# Patient Record
Sex: Female | Born: 1950 | Race: White | Hispanic: No | Marital: Single | Smoking: Never smoker
Health system: Southern US, Community
[De-identification: ages and names within clinical notes are randomized; demographics above are authoritative.]

## PROBLEM LIST (undated history)

## (undated) DIAGNOSIS — I499 Cardiac arrhythmia, unspecified: Secondary | ICD-10-CM

## (undated) DIAGNOSIS — R011 Cardiac murmur, unspecified: Secondary | ICD-10-CM

## (undated) DIAGNOSIS — G4733 Obstructive sleep apnea (adult) (pediatric): Secondary | ICD-10-CM

## (undated) DIAGNOSIS — T8859XA Other complications of anesthesia, initial encounter: Secondary | ICD-10-CM

## (undated) DIAGNOSIS — T884XXA Failed or difficult intubation, initial encounter: Secondary | ICD-10-CM

## (undated) DIAGNOSIS — I10 Essential (primary) hypertension: Secondary | ICD-10-CM

## (undated) DIAGNOSIS — I779 Disorder of arteries and arterioles, unspecified: Secondary | ICD-10-CM

## (undated) DIAGNOSIS — I5020 Unspecified systolic (congestive) heart failure: Secondary | ICD-10-CM

## (undated) DIAGNOSIS — I739 Peripheral vascular disease, unspecified: Principal | ICD-10-CM

## (undated) DIAGNOSIS — D649 Anemia, unspecified: Secondary | ICD-10-CM

## (undated) DIAGNOSIS — E119 Type 2 diabetes mellitus without complications: Secondary | ICD-10-CM

## (undated) DIAGNOSIS — I2510 Atherosclerotic heart disease of native coronary artery without angina pectoris: Secondary | ICD-10-CM

## (undated) DIAGNOSIS — I350 Nonrheumatic aortic (valve) stenosis: Secondary | ICD-10-CM

## (undated) DIAGNOSIS — A159 Respiratory tuberculosis unspecified: Secondary | ICD-10-CM

## (undated) DIAGNOSIS — E669 Obesity, unspecified: Secondary | ICD-10-CM

## (undated) DIAGNOSIS — I4891 Unspecified atrial fibrillation: Secondary | ICD-10-CM

## (undated) DIAGNOSIS — E785 Hyperlipidemia, unspecified: Secondary | ICD-10-CM

## (undated) DIAGNOSIS — I480 Paroxysmal atrial fibrillation: Secondary | ICD-10-CM

## (undated) DIAGNOSIS — M1990 Unspecified osteoarthritis, unspecified site: Secondary | ICD-10-CM

## (undated) DIAGNOSIS — Z9289 Personal history of other medical treatment: Secondary | ICD-10-CM

## (undated) DIAGNOSIS — E039 Hypothyroidism, unspecified: Secondary | ICD-10-CM

## (undated) HISTORY — DX: Personal history of other medical treatment: Z92.89

## (undated) HISTORY — DX: Atherosclerotic heart disease of native coronary artery without angina pectoris: I25.10

## (undated) HISTORY — PX: EYE SURGERY: SHX253

## (undated) HISTORY — PX: OTHER SURGICAL HISTORY: SHX169

## (undated) HISTORY — DX: Unspecified systolic (congestive) heart failure: I50.20

## (undated) HISTORY — PX: TONSILLECTOMY: SUR1361

## (undated) HISTORY — PX: COLONOSCOPY W/ POLYPECTOMY: SHX1380

## (undated) HISTORY — DX: Unspecified atrial fibrillation: I48.91

## (undated) HISTORY — DX: Obesity, unspecified: E66.9

## (undated) HISTORY — DX: Peripheral vascular disease, unspecified: I73.9

## (undated) HISTORY — PX: UPPER GI ENDOSCOPY: SHX6162

## (undated) HISTORY — DX: Obstructive sleep apnea (adult) (pediatric): G47.33

## (undated) HISTORY — DX: Disorder of arteries and arterioles, unspecified: I77.9

## (undated) HISTORY — PX: CATARACT EXTRACTION: SUR2

## (undated) HISTORY — PX: DILATION AND CURETTAGE OF UTERUS: SHX78

## (undated) HISTORY — DX: Nonrheumatic aortic (valve) stenosis: I35.0

## (undated) HISTORY — DX: Hyperlipidemia, unspecified: E78.5

---

## 2007-01-17 DIAGNOSIS — L02519 Cutaneous abscess of unspecified hand: Secondary | ICD-10-CM | POA: Insufficient documentation

## 2007-01-17 DIAGNOSIS — L03019 Cellulitis of unspecified finger: Secondary | ICD-10-CM | POA: Insufficient documentation

## 2007-02-14 DIAGNOSIS — R7401 Elevation of levels of liver transaminase levels: Secondary | ICD-10-CM | POA: Insufficient documentation

## 2007-02-14 DIAGNOSIS — R7402 Elevation of levels of lactic acid dehydrogenase (LDH): Secondary | ICD-10-CM | POA: Insufficient documentation

## 2007-02-14 DIAGNOSIS — L4050 Arthropathic psoriasis, unspecified: Secondary | ICD-10-CM | POA: Insufficient documentation

## 2010-06-06 DIAGNOSIS — E039 Hypothyroidism, unspecified: Secondary | ICD-10-CM | POA: Insufficient documentation

## 2013-10-27 ENCOUNTER — Inpatient Hospital Stay (HOSPITAL_COMMUNITY)
Admission: EM | Admit: 2013-10-27 | Discharge: 2013-10-28 | DRG: 310 | Disposition: A | Discharge status: Home / Self Care | Payer: BC Managed Care – PPO | Attending: Cardiology | Admitting: Cardiology

## 2013-10-27 ENCOUNTER — Encounter (HOSPITAL_COMMUNITY): Payer: Self-pay | Admitting: Emergency Medicine

## 2013-10-27 ENCOUNTER — Emergency Department (HOSPITAL_COMMUNITY): Payer: BC Managed Care – PPO

## 2013-10-27 DIAGNOSIS — Z7985 Long-term (current) use of injectable non-insulin antidiabetic drugs: Secondary | ICD-10-CM | POA: Diagnosis present

## 2013-10-27 DIAGNOSIS — E669 Obesity, unspecified: Secondary | ICD-10-CM | POA: Diagnosis present

## 2013-10-27 DIAGNOSIS — E034 Atrophy of thyroid (acquired): Secondary | ICD-10-CM

## 2013-10-27 DIAGNOSIS — E119 Type 2 diabetes mellitus without complications: Secondary | ICD-10-CM

## 2013-10-27 DIAGNOSIS — E1159 Type 2 diabetes mellitus with other circulatory complications: Secondary | ICD-10-CM

## 2013-10-27 DIAGNOSIS — I152 Hypertension secondary to endocrine disorders: Secondary | ICD-10-CM

## 2013-10-27 DIAGNOSIS — I4891 Unspecified atrial fibrillation: Principal | ICD-10-CM

## 2013-10-27 DIAGNOSIS — I517 Cardiomegaly: Secondary | ICD-10-CM

## 2013-10-27 DIAGNOSIS — I10 Essential (primary) hypertension: Secondary | ICD-10-CM | POA: Diagnosis present

## 2013-10-27 DIAGNOSIS — E039 Hypothyroidism, unspecified: Secondary | ICD-10-CM | POA: Diagnosis present

## 2013-10-27 DIAGNOSIS — E079 Disorder of thyroid, unspecified: Secondary | ICD-10-CM

## 2013-10-27 HISTORY — DX: Unspecified atrial fibrillation: I48.91

## 2013-10-27 HISTORY — DX: Essential (primary) hypertension: I10

## 2013-10-27 HISTORY — DX: Hypothyroidism, unspecified: E03.9

## 2013-10-27 HISTORY — DX: Unspecified osteoarthritis, unspecified site: M19.90

## 2013-10-27 HISTORY — DX: Paroxysmal atrial fibrillation: I48.0

## 2013-10-27 HISTORY — DX: Type 2 diabetes mellitus without complications: E11.9

## 2013-10-27 LAB — CBC WITH DIFFERENTIAL/PLATELET
BASOS PCT: 1 % (ref 0–1)
Basophils Absolute: 0 10*3/uL (ref 0.0–0.1)
EOS ABS: 0.1 10*3/uL (ref 0.0–0.7)
Eosinophils Relative: 2 % (ref 0–5)
HCT: 35.6 % — ABNORMAL LOW (ref 36.0–46.0)
HEMOGLOBIN: 11.5 g/dL — AB (ref 12.0–15.0)
LYMPHS ABS: 0.9 10*3/uL (ref 0.7–4.0)
Lymphocytes Relative: 11 % — ABNORMAL LOW (ref 12–46)
MCH: 27.8 pg (ref 26.0–34.0)
MCHC: 32.3 g/dL (ref 30.0–36.0)
MCV: 86 fL (ref 78.0–100.0)
MONO ABS: 0.5 10*3/uL (ref 0.1–1.0)
MONOS PCT: 6 % (ref 3–12)
NEUTROS ABS: 6.4 10*3/uL (ref 1.7–7.7)
Neutrophils Relative %: 80 % — ABNORMAL HIGH (ref 43–77)
Platelets: 204 10*3/uL (ref 150–400)
RBC: 4.14 MIL/uL (ref 3.87–5.11)
RDW: 15.9 % — ABNORMAL HIGH (ref 11.5–15.5)
WBC: 7.9 10*3/uL (ref 4.0–10.5)

## 2013-10-27 LAB — GLUCOSE, CAPILLARY
GLUCOSE-CAPILLARY: 174 mg/dL — AB (ref 70–99)
GLUCOSE-CAPILLARY: 150 mg/dL — AB (ref 70–99)

## 2013-10-27 LAB — I-STAT TROPONIN, ED: Troponin i, poc: 0.01 ng/mL (ref 0.00–0.08)

## 2013-10-27 LAB — BASIC METABOLIC PANEL
BUN: 14 mg/dL (ref 6–23)
CHLORIDE: 98 meq/L (ref 96–112)
CO2: 24 mEq/L (ref 19–32)
Calcium: 9.3 mg/dL (ref 8.4–10.5)
Creatinine, Ser: 0.46 mg/dL — ABNORMAL LOW (ref 0.50–1.10)
GFR calc non Af Amer: >90 mL/min (ref >90)
GLUCOSE: 230 mg/dL — AB (ref 70–99)
Potassium: 3.9 mEq/L (ref 3.7–5.3)
Sodium: 137 mEq/L (ref 137–147)

## 2013-10-27 LAB — TROPONIN I: Troponin I: <0.3 ng/mL (ref <0.30)

## 2013-10-27 LAB — HEMOGLOBIN A1C
Hgb A1c MFr Bld: 7.7 % — ABNORMAL HIGH (ref <5.7)
Mean Plasma Glucose: 174 mg/dL — ABNORMAL HIGH (ref <117)

## 2013-10-27 LAB — PRO B NATRIURETIC PEPTIDE: Pro B Natriuretic peptide (BNP): 200.6 pg/mL — ABNORMAL HIGH (ref 0–125)

## 2013-10-27 LAB — TSH: TSH: 3.07 u[IU]/mL (ref 0.350–4.500)

## 2013-10-27 MED ORDER — SODIUM CHLORIDE 0.9 % IJ SOLN: 3.0000 mL | INTRAMUSCULAR | Status: DC | PRN

## 2013-10-27 MED ORDER — DILTIAZEM HCL 60 MG PO TABS
60.0000 mg | ORAL_TABLET | ORAL | Status: AC
  Administered 2013-10-27: 60 mg via ORAL
  Filled 2013-10-27: qty 1

## 2013-10-27 MED ORDER — SODIUM CHLORIDE 0.9 % IV SOLN: 250.0000 mL | INTRAVENOUS | Status: DC | PRN

## 2013-10-27 MED ORDER — NITROGLYCERIN 0.4 MG SL SUBL: 0.4000 mg | SUBLINGUAL_TABLET | SUBLINGUAL | Status: DC | PRN

## 2013-10-27 MED ORDER — GLYBURIDE MICRONIZED 3 MG PO TABS: 3.0000 mg | ORAL_TABLET | Freq: Every day | ORAL | Status: DC

## 2013-10-27 MED ORDER — ZOLPIDEM TARTRATE 5 MG PO TABS: 5.0000 mg | ORAL_TABLET | Freq: Every evening | ORAL | Status: DC | PRN

## 2013-10-27 MED ORDER — ALPRAZOLAM 0.25 MG PO TABS: 0.2500 mg | ORAL_TABLET | Freq: Two times a day (BID) | ORAL | Status: DC | PRN

## 2013-10-27 MED ORDER — GLYBURIDE MICRONIZED 3 MG PO TABS
3.0000 mg | ORAL_TABLET | Freq: Every day | ORAL | Status: DC
  Administered 2013-10-27 – 2013-10-28 (×2): 3 mg via ORAL
  Filled 2013-10-27 (×3): qty 1

## 2013-10-27 MED ORDER — DILTIAZEM HCL 60 MG PO TABS
60.0000 mg | ORAL_TABLET | Freq: Four times a day (QID) | ORAL | Status: DC
  Administered 2013-10-27 – 2013-10-28 (×5): 60 mg via ORAL
  Filled 2013-10-27 (×7): qty 1

## 2013-10-27 MED ORDER — SODIUM CHLORIDE 0.9 % IJ SOLN
3.0000 mL | Freq: Two times a day (BID) | INTRAMUSCULAR | Status: DC
  Administered 2013-10-27 – 2013-10-28 (×2): 3 mL via INTRAVENOUS

## 2013-10-27 MED ORDER — ONDANSETRON HCL 4 MG/2ML IJ SOLN: 4.0000 mg | Freq: Four times a day (QID) | INTRAMUSCULAR | Status: DC | PRN

## 2013-10-27 MED ORDER — INSULIN ASPART 100 UNIT/ML ~~LOC~~ SOLN: 0.0000 [IU] | Freq: Every day | SUBCUTANEOUS | Status: DC

## 2013-10-27 MED ORDER — APIXABAN 5 MG PO TABS
5.0000 mg | ORAL_TABLET | Freq: Two times a day (BID) | ORAL | Status: DC
  Administered 2013-10-27 – 2013-10-28 (×3): 5 mg via ORAL
  Filled 2013-10-27 (×4): qty 1

## 2013-10-27 MED ORDER — ASPIRIN 81 MG PO CHEW
324.0000 mg | CHEWABLE_TABLET | Freq: Once | ORAL | Status: DC
  Filled 2013-10-27: qty 4

## 2013-10-27 MED ORDER — METOPROLOL SUCCINATE ER 100 MG PO TB24: 100.0000 mg | ORAL_TABLET | Freq: Every day | ORAL | Status: DC

## 2013-10-27 MED ORDER — INSULIN ASPART 100 UNIT/ML ~~LOC~~ SOLN
0.0000 [IU] | Freq: Three times a day (TID) | SUBCUTANEOUS | Status: DC
  Administered 2013-10-27 – 2013-10-28 (×3): 3 [IU] via SUBCUTANEOUS
  Administered 2013-10-28: 12:00:00 via SUBCUTANEOUS

## 2013-10-27 MED ORDER — METOPROLOL SUCCINATE ER 100 MG PO TB24
100.0000 mg | ORAL_TABLET | Freq: Every day | ORAL | Status: DC
  Administered 2013-10-27 – 2013-10-28 (×2): 100 mg via ORAL
  Filled 2013-10-27 (×2): qty 1

## 2013-10-27 MED ORDER — SIMVASTATIN 10 MG PO TABS
10.0000 mg | ORAL_TABLET | Freq: Every day | ORAL | Status: DC
  Administered 2013-10-27 – 2013-10-28 (×2): 10 mg via ORAL
  Filled 2013-10-27 (×2): qty 1

## 2013-10-27 MED ORDER — LEVOTHYROXINE SODIUM 112 MCG PO TABS
168.0000 ug | ORAL_TABLET | Freq: Every day | ORAL | Status: DC
  Administered 2013-10-27 – 2013-10-28 (×2): 168 ug via ORAL
  Filled 2013-10-27 (×4): qty 1.5

## 2013-10-27 MED ORDER — DILTIAZEM HCL 100 MG IV SOLR: 5.0000 mg/h | Freq: Once | INTRAVENOUS | Status: DC

## 2013-10-27 MED ORDER — DILTIAZEM HCL 25 MG/5ML IV SOLN
10.0000 mg | Freq: Once | INTRAVENOUS | Status: AC
Start: 2013-10-27 — End: 2013-10-27
  Administered 2013-10-27: 10 mg via INTRAVENOUS
  Filled 2013-10-27: qty 5

## 2013-10-27 MED ORDER — ACETAMINOPHEN 325 MG PO TABS: 650.0000 mg | ORAL_TABLET | ORAL | Status: DC | PRN

## 2013-10-27 NOTE — ED Notes (Signed)
PA at bedside.

## 2013-10-27 NOTE — ED Notes (Signed)
Communication with Pharmacy. Will send missing meds

## 2013-10-27 NOTE — H&P (Signed)
Addendum to H&P  OUT PATIENT MEDICATIONS: glynase 3 mg tab daily Hydrodiuril 25 mg daily Levothyroxine 168 mcg po daily Cozaar 50 mg daily mevacor 20 mg daily Metformin 500 mg BID Toprol XL 100 mg daily  Family History: Mother died in childbirth Father died with COPD, cardiac disease and CHF Sister died at 106 with cerebral bleed 1 sister died with a fib, PPM and heard disease 1 sister died with cancer 3 living sisters, with atrial fib.   Dillard Cannon, MD

## 2013-10-27 NOTE — ED Notes (Addendum)
Communication with Cards. Pt needs Step down bed canceled. Per Dr. Ron Parker see new order. Pt to go to Telemetry Bed. Vitals stable.

## 2013-10-27 NOTE — Progress Notes (Signed)
  Echocardiogram 2D Echocardiogram has been performed.  Carney Corners 10/27/2013, 2:47 PM

## 2013-10-27 NOTE — ED Provider Notes (Signed)
Medical screening examination/treatment/procedure(s) were performed by non-physician practitioner and as supervising physician I was immediately available for consultation/collaboration.   EKG Interpretation None       Kalman Drape, MD 10/27/13 [phone]

## 2013-10-27 NOTE — Progress Notes (Signed)
Patient ID: Emily [name], female   DOB: [birth year], 63 y.o.   MRN: [id number]  The patient remains in atrial fibrillation. She has overall good left ventricular function. However she has risk factors for coronary disease. I considered giving her 300 mg of flecainide, but I have not proven that she does not have ischemic disease. Therefore we will continue to watch her rhythm during the night. I have ordered a started her anticoagulation. Decision can be made tomorrow as to whether or not she can go home with anticoagulation for cardioversion later , or whether we should proceed with TEE cardioversion in the hospital.   Daryel November, MD

## 2013-10-27 NOTE — ED Notes (Signed)
Pt reports around 0400 this morning she turned over in bed and had sudden back pain, sob, and diaphoresis. Pt was showing a-fib on monitor per EMS. Pt has no hx of a-fib. Pt denies any cp.

## 2013-10-27 NOTE — ED Notes (Signed)
MD at bedside. Dr. Ron Parker Cardiology

## 2013-10-27 NOTE — H&P (Signed)
CARDIOLOGY HISTORY AND PHYSICAL   Patient ID: Emily [name] MRN: [id number]  DOB/AGE: Aug 03, 1950 63 y.o. Admit date: 10/27/2013  Primary Care Physician: [name], MD, Rondall Allegra Primary Cardiologist:   New   ?To be [facility] in [place]?  Clinical Summary Emily [name] is a 63 y.o.female. She receives her primary care in [place]. However she lives in [place]. She has never been seen by cardiology before. There is a history of diabetes. There is a history of hypothyroidism that is being treated. There is also a history of hypertension is being treated.  The patient has had stress tests in the past. She has had this because of some back discomfort. She tells me that the studies have been negative in the past. She's not sure she's had an echo. There is no prior documented coronary disease.  The patient awoke this morning with a rapid heart rate and slight discomfort in her back. She was brought to the emergency room. She has rapid atrial fib here. Her EKG reveals no diagnostic change. Her first troponin is normal. BNP is mildly elevated. With Cardizem her rate has been brought down from 160-120.   No Known Allergies  Home Medications  (Not in a hospital admission)  Scheduled Medications . apixaban  5 mg Oral BID  . glyBURIDE micronized  3 mg Oral Q breakfast  . metoprolol succinate  100 mg Oral Daily     Infusions . diltiazem (CARDIZEM) infusion 10 mg/hr (10/27/13 0701)     PRN Medications    Past Medical History  Diagnosis Date  .  diabetes    .  hypertension    .  hypothyroidism    .        Past Surgical History  Procedure Laterality Date  . Tonsillectomy    . Dilation and curettage of uterus      x 5   Family history The patient's family history is positive for coronary artery disease. There is also history of atrial fibrillation in the family.  Social History Emily [name] reports that she has never smoked. She does not have any  smokeless tobacco history on file. Emily [name] reports that she does not drink alcohol.  Review of Systems   Patient denies fever, chills, headache, sweats, rash, change in vision, change in hearing, chest pain, cough, nausea or vomiting, urinary symptoms. All other systems are reviewed and are negative.  Physical Examination Temp:  [98.2 F (36.8 C)] 98.2 F (36.8 C) (05/29 0648) Pulse Rate:  [32-159] 98 (05/29 0915) Resp:  [16-31] 21 (05/29 0915) BP: (105-148)/(47-104) 135/89 mmHg (05/29 0915) SpO2:  [97 %-100 %] 99 % (05/29 0915) No intake or output data in the 24 hours ending 10/27/13 0954  Patient is comfortable lying in the bed in the emergency room. She has family members with her. She is overweight. She is oriented to person time and place. Affect is normal. Head is atraumatic. Conjunctiva and sclera are normal. There is no jugulovenous distention. Lungs are clear. Respiratory effort is nonlabored. Cardiac exam her vitals S1 and S2. The rhythm is irregularly irregular the rate is fast. Abdomen is soft. There is no peripheral edema. There no musculoskeletal deformities. There are no skin rashes.  Lab Results  Basic Metabolic Panel:  Recent Labs Lab 10/27/13 0653  NA 137  K 3.9  CL 98  CO2 24  GLUCOSE 230*  BUN 14  CREATININE 0.46*  CALCIUM 9.3    Liver Function Tests: No results found for this  basename: AST, ALT, ALKPHOS, BILITOT, PROT, ALBUMIN,  in the last 168 hours  CBC:  Recent Labs Lab 10/27/13 0653  WBC 7.9  NEUTROABS 6.4  HGB 11.5*  HCT 35.6*  MCV 86.0  PLT 204    Cardiac Enzymes: No results found for this basename: CKTOTAL, CKMB, CKMBINDEX, TROPONINI,  in the last 168 hours  BNP: No components found with this basename: POCBNP,    Radiology Dg Chest Port 1 View  10/27/2013   CLINICAL DATA:  Atrial fibrillation.  Back pain.  EXAM: PORTABLE CHEST - 1 VIEW  COMPARISON:  None  FINDINGS: Artifact overlies the chest. The heart is at the upper  limits of normal in size. The pulmonary vascularity is normal. There is linear scarring or atelectasis in the left mid lung. No effusions. No bony findings.  IMPRESSION: No evidence heart failure. Linear scarring or atelectasis in the left midlung.   Electronically Signed   By: Nelson Chimes M.D.   On: 10/27/2013 07:02    Prior Cardiac Testing/Procedures:   ECG   EKG reveals no diagnostic changes.  Telemetry   Currently she has atrial fib with a rate of 115.   Impression and Recommendations    Diabetes     Goal give the patient her Glynase this morning. Goalkeeper metformin on hold.    Hypertension     Currently her blood pressure is on the lower side. I am giving her IV Cardizem. Therefore I have not started her other home blood pressure medications other than her metoprolol. All    Hypothyroidism    The patient is on Synthroid. TSH has been sent. Have not yet order for thyroid medication.    Atrial fibrillation with rapid ventricular response     This is a new diagnosis for the patient. Her rate is rapid. She is receiving IV diltiazem. We'll proceed with 2-D echo. If she has good LV function I may give her 300 mg of oral flecainide to see if this helps convert her. There is no documented coronary disease in the past. She has had stress tests in the past. I have started Eliquis 5 mg twice a day to be started this morning. The patient received some aspirin on the way to the hospital. I have stopped any further aspirin for now.  Signed: Carlena Bjornstad 10/27/2013, 9:54 AM

## 2013-10-27 NOTE — ED Notes (Signed)
Per Dr. Ron Parker increase Cardizem to 10 mg/hr

## 2013-10-27 NOTE — ED Provider Notes (Signed)
CSN: [id number]     Arrival date & time 10/27/13  1856 History   First MD Initiated Contact with Patient 10/27/13 0636     Chief Complaint  Patient presents with  . Atrial Fibrillation     (Consider location/radiation/quality/duration/timing/severity/associated sxs/prior Treatment) The history is provided by the patient and medical records.   This is a 63 year old female with past medical history significant for hypertension, diabetes, thyroid disease, presenting to the ED with new onset AFIB.  Patient states she awoke suddenly at 0400 this morning, rolled over in bed and felt that her heart rhythm was irregular. She states she wass having some pain in her upper back, shortness of breath, and diaphoresis.  EMS was called, and patient was found to be in A. fib. She is placed on 2 L of supplemental oxygen with improvement of her symptoms.  Patient has no prior cardiac history. She states 4/6 of her sisters all have A. Fib.  Pt give ASA by EMS en route.  VS stable on arrival.  History reviewed. No pertinent past medical history. History reviewed. No pertinent past surgical history. No family history on file. History  Substance Use Topics  . Smoking status: Not on file  . Smokeless tobacco: Not on file  . Alcohol Use: Not on file   OB History   Grav Para Term Preterm Abortions TAB SAB Ect Mult Living                 Review of Systems  Constitutional: Positive for diaphoresis.  Respiratory: Positive for shortness of breath.   Cardiovascular: Positive for palpitations.      Allergies  Review of patient's allergies indicates not on file.  Home Medications   Prior to Admission medications   Not on File   BP 126/61  Pulse 159  Temp(Src) 98.2 F (36.8 C) (Oral)  Resp 20  SpO2 99%  Physical Exam  Nursing note and vitals reviewed. Constitutional: She is oriented to person, place, and time. She appears well-developed and well-nourished. No distress.  obese  HENT:  Head:  Normocephalic and atraumatic.  Mouth/Throat: Oropharynx is clear and moist.  Eyes: Conjunctivae and EOM are normal. Pupils are equal, round, and reactive to light.  Neck: Normal range of motion.  Cardiovascular: Normal heart sounds, intact distal pulses and normal pulses.  An irregularly irregular rhythm present. Tachycardia present.   AFIB  Pulmonary/Chest: Effort normal and breath sounds normal.  Abdominal: Soft. Bowel sounds are normal.  Musculoskeletal: Normal range of motion.  Trace edema BLE  Neurological: She is alert and oriented to person, place, and time.  Skin: Skin is warm and dry. She is not diaphoretic.  Psychiatric: She has a normal mood and affect.    ED Course  Procedures (including critical care time)  CRITICAL CARE Performed by: Larene Pickett   Total critical care time: 35  Critical care time was exclusive of separately billable procedures and treating other patients.  Critical care was necessary to treat or prevent imminent or life-threatening deterioration.  Critical care was time spent personally by me on the following activities: development of treatment plan with patient and/or surrogate as well as nursing, discussions with consultants, evaluation of patient's response to treatment, examination of patient, obtaining history from patient or surrogate, ordering and performing treatments and interventions, ordering and review of laboratory studies, ordering and review of radiographic studies, pulse oximetry and re-evaluation of patient's condition.  Medications  aspirin chewable tablet 324 mg (324 mg Oral Not Given  10/27/13 0701)  diltiazem (CARDIZEM) injection 10 mg (10 mg Intravenous New Bag/Given 10/27/13 0701)  diltiazem (CARDIZEM) 100 mg in dextrose 5 % 100 mL infusion (5 mg/hr Intravenous New Bag/Given 10/27/13 0701)     Labs Review Labs Reviewed  CBC WITH DIFFERENTIAL - Abnormal; Notable for the following:    Hemoglobin 11.5 (*)    HCT 35.6 (*)     RDW 15.9 (*)    Neutrophils Relative % 80 (*)    Lymphocytes Relative 11 (*)    All other components within normal limits  BASIC METABOLIC PANEL - Abnormal; Notable for the following:    Glucose, Bld 230 (*)    Creatinine, Ser 0.46 (*)    All other components within normal limits  PRO B NATRIURETIC PEPTIDE - Abnormal; Notable for the following:    Pro B Natriuretic peptide (BNP) 200.6 (*)    All other components within normal limits  Randolm Idol, ED    Imaging Review Dg Chest Port 1 View  10/27/2013   CLINICAL DATA:  Atrial fibrillation.  Back pain.  EXAM: PORTABLE CHEST - 1 VIEW  COMPARISON:  None  FINDINGS: Artifact overlies the chest. The heart is at the upper limits of normal in size. The pulmonary vascularity is normal. There is linear scarring or atelectasis in the left mid lung. No effusions. No bony findings.  IMPRESSION: No evidence heart failure. Linear scarring or atelectasis in the left midlung.   Electronically Signed   By: Nelson Chimes M.D.   On: 10/27/2013 07:02    Date: 10/27/2013  Rate: 155  Rhythm: atrial fibrillation  QRS Axis: normal  Intervals: indeterminate  ST/T Wave abnormalities: indeterminate  Conduction Disutrbances:none  Narrative Interpretation: AFIB w/ RVR  Old EKG Reviewed: none available     EKG Interpretation None      MDM   Final diagnoses:  New onset a-fib  HTN (hypertension)  DM (diabetes mellitus)  Thyroid disease   63 year old female presented with new-onset A. fib. Vital signs are stable.  Denies any current chest pain. Patient given aspirin en route.  Pt given cardizem bolus and started on drip.  Work-up initiated.  EKG confirmed AFIB w/RVR, rate 150's.  Labs and CXR pending.  After meds started, rate improved to 110's-120's.  Pt states she is feeling better.   Labs reassuring.  Troponin negative.  CXR clear.  Discussed with cardiology who will admit for further management.  Larene Pickett, PA-C 10/27/13 [phone]

## 2013-10-28 ENCOUNTER — Encounter (HOSPITAL_COMMUNITY): Payer: Self-pay | Admitting: Physician Assistant

## 2013-10-28 DIAGNOSIS — E119 Type 2 diabetes mellitus without complications: Secondary | ICD-10-CM | POA: Diagnosis present

## 2013-10-28 DIAGNOSIS — Z7985 Long-term (current) use of injectable non-insulin antidiabetic drugs: Secondary | ICD-10-CM | POA: Diagnosis present

## 2013-10-28 LAB — COMPREHENSIVE METABOLIC PANEL
ALK PHOS: 99 U/L (ref 39–117)
ALT: 25 U/L (ref 0–35)
AST: 29 U/L (ref 0–37)
Albumin: 3.2 g/dL — ABNORMAL LOW (ref 3.5–5.2)
BUN: 15 mg/dL (ref 6–23)
CALCIUM: 8.5 mg/dL (ref 8.4–10.5)
CO2: 26 meq/L (ref 19–32)
Chloride: 103 mEq/L (ref 96–112)
Creatinine, Ser: 0.51 mg/dL (ref 0.50–1.10)
GFR calc Af Amer: >90 mL/min (ref >90)
GFR calc non Af Amer: >90 mL/min (ref >90)
GLUCOSE: 169 mg/dL — AB (ref 70–99)
Potassium: 4 mEq/L (ref 3.7–5.3)
Sodium: 142 mEq/L (ref 137–147)
TOTAL PROTEIN: 6.4 g/dL (ref 6.0–8.3)
Total Bilirubin: 0.5 mg/dL (ref 0.3–1.2)

## 2013-10-28 LAB — TROPONIN I

## 2013-10-28 LAB — GLUCOSE, CAPILLARY
GLUCOSE-CAPILLARY: 173 mg/dL — AB (ref 70–99)
Glucose-Capillary: 181 mg/dL — ABNORMAL HIGH (ref 70–99)
Glucose-Capillary: 152 mg/dL — ABNORMAL HIGH (ref 70–99)

## 2013-10-28 MED ORDER — APIXABAN 5 MG PO TABS: 5.0000 mg | ORAL_TABLET | Freq: Two times a day (BID) | ORAL | Status: DC

## 2013-10-28 MED ORDER — DILTIAZEM HCL 60 MG PO TABS: 60.0000 mg | ORAL_TABLET | Freq: Three times a day (TID) | ORAL | Status: DC

## 2013-10-28 MED ORDER — FLECAINIDE ACETATE 150 MG PO TABS: 300.0000 mg | ORAL_TABLET | ORAL | Status: DC | PRN

## 2013-10-28 MED ORDER — FLECAINIDE ACETATE 150 MG PO TABS: 300.0000 mg | ORAL_TABLET | Freq: Two times a day (BID) | ORAL | Status: DC

## 2013-10-28 NOTE — Progress Notes (Signed)
Patient ID: Trinka Keshishyan, female   DOB: May 05, 1951, 63 y.o.   MRN: [id number]   Patient Name: Emily [name] Date of Encounter: 10/28/2013     Active Problems:   Diabetes   Hypertension   Hypothyroidism   Atrial fibrillation with rapid ventricular response   Atrial fibrillation, rapid    SUBJECTIVE  I feel better CURRENT MEDS . apixaban  5 mg Oral BID  . diltiazem (CARDIZEM) infusion  5-15 mg/hr Intravenous Once  . diltiazem  60 mg Oral 4 times per day  . glyBURIDE micronized  3 mg Oral Q breakfast  . insulin aspart  0-15 Units Subcutaneous TID WC  . insulin aspart  0-5 Units Subcutaneous QHS  . levothyroxine  168 mcg Oral QAC breakfast  . metoprolol succinate  100 mg Oral Daily  . simvastatin  10 mg Oral q1800  . sodium chloride  3 mL Intravenous Q12H    OBJECTIVE  Filed Vitals:   10/27/13 1700 10/27/13 2116 10/28/13 0619 10/28/13 1054  BP:  132/70 124/53 126/57  Pulse:  114 61 57  Temp:  98.8 F (37.1 C) 97.7 F (36.5 C)   TempSrc:  Oral Oral   Resp:  20 18   Height: 5\' 2"  (1.575 m)     Weight: 260 lb 3.2 oz (118.026 kg)     SpO2:  99% 100%     Intake/Output Summary (Last 24 hours) at 10/28/13 1343 Last data filed at 10/28/13 0900  Gross per 24 hour  Intake    240 ml  Output      0 ml  Net    240 ml   Filed Weights   10/27/13 1700  Weight: 260 lb 3.2 oz (118.026 kg)    PHYSICAL EXAM  General: Pleasant, massively obese,NAD. Neuro: Alert and oriented X 3. Moves all extremities spontaneously. Psych: Normal affect. HEENT:  Normal  Neck: Supple without bruits or JVD. Lungs:  Resp regular and unlabored, CTA. Heart: RRR no s3, s4, or murmurs. Abdomen: Soft, obesenon-tender, non-distended, BS + x 4.  Extremities: No clubbing, cyanosis or edema. DP/PT/Radials 2+ and equal bilaterally.  Accessory Clinical Findings  CBC  Recent Labs  10/27/13 0653  WBC 7.9  NEUTROABS 6.4  HGB 11.5*  HCT 35.6*  MCV 86.0  PLT 474   Basic Metabolic  Panel  Recent Labs  10/27/13 0653 10/28/13 0422  NA 137 142  K 3.9 4.0  CL 98 103  CO2 24 26  GLUCOSE 230* 169*  BUN 14 15  CREATININE 0.46* 0.51  CALCIUM 9.3 8.5   Liver Function Tests  Recent Labs  10/28/13 0422  AST 29  ALT 25  ALKPHOS 99  BILITOT 0.5  PROT 6.4  ALBUMIN 3.2*   No results found for this basename: LIPASE, AMYLASE,  in the last 72 hours Cardiac Enzymes  Recent Labs  10/27/13 1122 10/27/13 1615 10/27/13 2326  TROPONINI <0.30 <0.30 <0.30   BNP No components found with this basename: POCBNP,  D-Dimer No results found for this basename: DDIMER,  in the last 72 hours Hemoglobin A1C  Recent Labs  10/27/13 0653  HGBA1C 7.7*   Fasting Lipid Panel No results found for this basename: CHOL, HDL, LDLCALC, TRIG, CHOLHDL, LDLDIRECT,  in the last 72 hours Thyroid Function Tests  Recent Labs  10/27/13 0932  TSH 3.070    TELE nsr   Radiology/Studies  Dg Chest Port 1 View  10/27/2013   CLINICAL DATA:  Atrial fibrillation.  Back pain.  EXAM: PORTABLE  CHEST - 1 VIEW  COMPARISON:  None  FINDINGS: Artifact overlies the chest. The heart is at the upper limits of normal in size. The pulmonary vascularity is normal. There is linear scarring or atelectasis in the left mid lung. No effusions. No bony findings.  IMPRESSION: No evidence heart failure. Linear scarring or atelectasis in the left midlung.   Electronically Signed   By: Nelson Chimes M.D.   On: 10/27/2013 07:02    ASSESSMENT AND PLAN 1. Atrial fib with an RVR 2. Obesity 3. HTN 4. Probable sleep apnea Rec: ok for discharge home on anticoagulation and pill in the pocket flecainide 300 mg as needed. She can followup with Dr. Percival Spanish in [place] in a couple of weeks.  Daritza Brees,M.D.  10/28/2013 1:43 PM

## 2013-10-28 NOTE — Discharge Instructions (Signed)
Information on my medicine - ELIQUIS (apixaban)  This medication education was reviewed with me or my healthcare representative as part of my discharge preparation.  The pharmacist that spoke with me during my hospital stay was:  Juanda Chance Natalie Mceuen, [facility]  Why was Eliquis prescribed for you? Eliquis was prescribed for you to reduce the risk of a blood clot forming that can cause a stroke if you have a medical condition called atrial fibrillation (a type of irregular heartbeat).  What do You need to know about Eliquis ? Take your Eliquis TWICE DAILY - one tablet in the morning and one tablet in the evening with or without food. If you have difficulty swallowing the tablet whole please discuss with your pharmacist how to take the medication safely.  Take Eliquis exactly as prescribed by your doctor and DO NOT stop taking Eliquis without talking to the doctor who prescribed the medication.  Stopping may increase your risk of developing a stroke.  Refill your prescription before you run out.  After discharge, you should have regular check-up appointments with your healthcare provider that is prescribing your Eliquis.  In the future your dose may need to be changed if your kidney function or weight changes by a significant amount or as you get older.  What do you do if you miss a dose? If you miss a dose, take it as soon as you remember on the same day and resume taking twice daily.  Do not take more than one dose of ELIQUIS at the same time to make up a missed dose.  Important Safety Information A possible side effect of Eliquis is bleeding. You should call your healthcare provider right away if you experience any of the following:   Bleeding from an injury or your nose that does not stop.   Unusual colored urine (red or dark brown) or unusual colored stools (red or black).   Unusual bruising for unknown reasons.   A serious fall or if you hit your head (even if there is no  bleeding).  Some medicines may interact with Eliquis and might increase your risk of bleeding or clotting while on Eliquis. To help avoid this, consult your healthcare provider or pharmacist prior to using any new prescription or non-prescription medications, including herbals, vitamins, non-steroidal anti-inflammatory drugs (NSAIDs) and supplements.  This website has more information on Eliquis (apixaban): [email or web address]. Atrial Fibrillation Atrial fibrillation is a type of irregular heart rhythm (arrhythmia). During atrial fibrillation, the upper chambers of the heart (atria) quiver continuously in a chaotic pattern. This causes an irregular and often rapid heart rate.  Atrial fibrillation is the result of the heart becoming overloaded with disorganized signals that tell it to beat. These signals are normally released one at a time by a part of the right atrium called the sinoatrial node. They then travel from the atria to the lower chambers of the heart (ventricles), causing the atria and ventricles to contract and pump blood as they pass. In atrial fibrillation, parts of the atria outside of the sinoatrial node also release these signals. This results in two problems. First, the atria receive so many signals that they do not have time to fully contract. Second, the ventricles, which can only receive one signal at a time, beat irregularly and out of rhythm with the atria.  There are three types of atrial fibrillation:   Paroxysmal Paroxysmal atrial fibrillation starts suddenly and stops on its own within a week.   Persistent Persistent atrial fibrillation lasts  for more than a week. It may stop on its own or with treatment.   Permanent Permanent atrial fibrillation does not go away. Episodes of atrial fibrillation may lead to permanent atrial fibrillation.  Atrial fibrillation can prevent your heart from pumping blood normally. It increases your risk of stroke and can lead to heart failure.   CAUSES   Heart conditions, including a heart attack, heart failure, coronary artery disease, and heart valve conditions.   Inflammation of the sac that surrounds the heart (pericarditis).   Blockage of an artery in the lungs (pulmonary embolism).   Pneumonia or other infections.   Chronic lung disease.   Thyroid problems, especially if the thyroid is overactive (hyperthyroidism).   Caffeine, excessive alcohol use, and use of some illegal drugs.   Use of some medications, including certain decongestants and diet pills.   Heart surgery.   Birth defects.  Sometimes, no cause can be found. When this happens, the atrial fibrillation is called lone atrial fibrillation. The risk of complications from atrial fibrillation increases if you have lone atrial fibrillation and you are age 95 years or older. RISK FACTORS  Heart failure.  Coronary artery disease  Diabetes mellitus.   High blood pressure (hypertension).   Obesity.   Other arrhythmias.   Increased age. SYMPTOMS   A feeling that your heart is beating rapidly or irregularly.   A feeling of discomfort or pain in your chest.   Shortness of breath.   Sudden lightheadedness or weakness.   Getting tired easily when exercising.   Urinating more often than normal (mainly when atrial fibrillation first begins).  In paroxysmal atrial fibrillation, symptoms may start and suddenly stop. DIAGNOSIS  Your caregiver may be able to detect atrial fibrillation when taking your pulse. Usually, testing is needed to diagnosis atrial fibrillation. Tests may include:   Electrocardiography. During this test, the electrical impulses of your heart are recorded while you are lying down.   Echocardiography. During echocardiography, sound waves are used to evaluate how blood flows through your heart.   Stress test. There is more than one type of stress test. If a stress test is needed, ask your caregiver about which  type is best for you.   Chest X-ray exam.   Blood tests.   Computed tomography (CT).  TREATMENT   Treating any underlying conditions. For example, if you have an overactive thyroid, treating the condition may correct atrial fibrillation.   Medication. Medications may be given to control a rapid heart rate or to prevent blood clots, heart failure, or a stroke.   Procedure to correct the rhythm of the heart:  Electrical cardioversion. During electrical cardioversion, a controlled, low-energy shock is delivered to the heart through your skin. If you have chest pain, very low pressure blood pressure, or sudden heart failure, this procedure may need to be done as an emergency.  Catheter ablation. During this procedure, heart tissues that send the signals that cause atrial fibrillation are destroyed.  Maze or minimaze procedure. During this surgery, thin lines of heart tissue that carry the abnormal signals are destroyed. The maze procedure is an open-heart surgery. The minimaze procedure is a minimally invasive surgery. This means that small cuts are made to access the heart instead of a large opening.  Pulmonary venous isolation. During this surgery, tissue around the veins that carry blood from the lungs (pulmonary veins) is destroyed. This tissue is thought to carry the abnormal signals. HOME CARE INSTRUCTIONS   Take medications as directed  by your caregiver.  Only take medications that your caregiver approves. Some medications can make atrial fibrillation worse or recur.  If blood thinners were prescribed by your caregiver, take them exactly as directed. Too much can cause bleeding. Too little and you will not have the needed protection against stroke and other problems.  Perform blood tests at home if directed by your caregiver.  Perform blood tests exactly as directed.   Quit smoking if you smoke.   Do not drink alcohol.   Do not drink caffeinated beverages such as  coffee, soda, and some teas. You may drink decaffeinated coffee, soda, or tea.   Maintain a healthy weight. Do not use diet pills unless your caregiver approves. They may make heart problems worse.   Follow diet instructions as directed by your caregiver.   Exercise regularly as directed by your caregiver.   Keep all follow-up appointments. PREVENTION  The following substances can cause atrial fibrillation to recur:   Caffeinated beverages.   Alcohol.   Certain medications, especially those used for breathing problems.   Certain herbs and herbal medications, such as those containing ephedra or ginseng.  Illegal drugs such as cocaine and amphetamines. Sometimes medications are given to prevent atrial fibrillation from recurring. Proper treatment of any underlying condition is also important in helping prevent recurrence.  SEEK MEDICAL CARE IF:  You notice a change in the rate, rhythm, or strength of your heartbeat.   You suddenly begin urinating more frequently.   You tire more easily when exerting yourself or exercising.  SEEK IMMEDIATE MEDICAL CARE IF:   You develop chest pain, abdominal pain, sweating, or weakness.  You feel sick to your stomach (nauseous).  You develop shortness of breath.  You suddenly develop swollen feet and ankles.  You feel dizzy.  You face or limbs feel numb or weak.  There is a change in your vision or speech. MAKE SURE YOU:   Understand these instructions.  Will watch your condition.  Will get help right away if you are not doing well or get worse. Document Released: 05/18/2005 Document Revised: 09/12/2012 Document Reviewed: 06/28/2012 [facility] Patient Information 2014 [place].

## 2013-10-28 NOTE — Discharge Summary (Signed)
Discharge Summary   Patient ID: Emily [name] MRN: [id number], DOB/AGE: [birth year] 63 y.o. Admit date: 10/27/2013 D/C date:     10/28/2013  Primary Cardiologist: New, Dr. Percival Spanish   Principal Problem:   Atrial fibrillation with rapid ventricular response Active Problems:   Diabetes   Hypertension   Hypothyroidism   Diabetes mellitus without complication   Discharge Diagnosis: Newly diagnosed atrial fibrillation with RVR  HPI: Emily [name] is a 63 y.o. female with a history of hypothyroidism, hypertension, diabetes mellitus who presented to [facility] on 10/27/2013 complaining of palpitations and discomfort in her back and was found to be in atrial fibrillation with RVR.   This was newly diagnosed atrial fibrillation. Her chads Vasc score is 3 (HTN, DM and female sex).  Hospital Course She was placed on a Cardizem drip and started on Eliquis 5 mg twice a day. A 2-D echo was ordered to assess LV function to help guide future medical therapy. 2-D echo revealed moderate LVH normal systolic function ( EF 85-46%) and moderate LA dilation. Otherwise normal. She spontaneously converted into NSR this morning on oral diltizem. She does not have a history of CAD but does have risk factors. However, Dr. Lovena Le felt it safe for her to be discharged on Flecainide therapy and she will be leave with the pill in the pocket approach with flecainide 300 mg as needed. A further ischemic evaluation may be warranted as an outpatient.   The patient has had an uncomplicated hospital course and is recovering well. All follow-up appointments have been scheduled. Discharge medications are detailed below. She will leave on Dilt 60mg  TID, flecainide 300mg  as needed and will resume all of her other home medications. She will follow up with Dr. Percival Spanish in [place].    Discharge Vitals: Blood pressure 122/68, pulse 64, temperature 98 F (36.7 C), temperature source Oral, resp. rate 18, height 5\' 2"   (1.575 m), weight 260 lb 3.2 oz (118.026 kg), SpO2 100.00%.  Labs: Lab Results  Component Value Date   WBC 7.9 10/27/2013   HGB 11.5* 10/27/2013   HCT 35.6* 10/27/2013   MCV 86.0 10/27/2013   PLT 204 10/27/2013     Recent Labs Lab 10/28/13 0422  NA 142  K 4.0  CL 103  CO2 26  BUN 15  CREATININE 0.51  CALCIUM 8.5  PROT 6.4  BILITOT 0.5  ALKPHOS 99  ALT 25  AST 29  GLUCOSE 169*    Recent Labs  10/27/13 1122 10/27/13 1615 10/27/13 2326  TROPONINI <0.30 <0.30 <0.30     Diagnostic Studies/Procedures   Dg Chest Port 1 View  10/27/2013   CLINICAL DATA:  Atrial fibrillation.  Back pain.  EXAM: PORTABLE CHEST - 1 VIEW  COMPARISON:  None  FINDINGS: Artifact overlies the chest. The heart is at the upper limits of normal in size. The pulmonary vascularity is normal. There is linear scarring or atelectasis in the left mid lung. No effusions. No bony findings.  IMPRESSION: No evidence heart failure. Linear scarring or atelectasis in the left midlung.     2D ECHO: 10/27/2013 LV EF: 60% - 65% Study Conclusions - Left ventricle: The cavity size was normal. Wall thickness was increased in a pattern of moderate LVH. Systolic function was normal. The estimated ejection fraction was in the range of 60% to 65%. Wall motion was normal; there were no regional wall motion abnormalities. - Left atrium: The atrium was moderately dilated.     Discharge Medications  Medication List         apixaban 5 MG Tabs tablet  Commonly known as:  ELIQUIS  Take 1 tablet (5 mg total) by mouth 2 (two) times daily.     diltiazem 60 MG tablet  Commonly known as:  CARDIZEM  Take 1 tablet (60 mg total) by mouth 3 (three) times daily.     flecainide 150 MG tablet  Commonly known as:  TAMBOCOR  Take 2 tablets (300 mg total) by mouth as needed (As needed for atrial fibrillation).     glyBURIDE micronized 3 MG tablet  Commonly known as:  GLYNASE  Take 3 mg by mouth daily.      hydrochlorothiazide 25 MG tablet  Commonly known as:  HYDRODIURIL  Take 25 mg by mouth daily.     levothyroxine 112 MCG tablet  Commonly known as:  SYNTHROID, LEVOTHROID  Take 168 mcg by mouth daily.     losartan 50 MG tablet  Commonly known as:  COZAAR  Take 50 mg by mouth daily.     lovastatin 20 MG tablet  Commonly known as:  MEVACOR  Take 20 mg by mouth daily.     metFORMIN 500 MG tablet  Commonly known as:  GLUCOPHAGE  Take 500 mg by mouth 2 (two) times daily.     metoprolol succinate 100 MG 24 hr tablet  Commonly known as:  TOPROL-XL  Take 100 mg by mouth daily.        Disposition   The patient will be discharged in stable condition to home.  Follow-up Information   Follow up with Minus Breeding, MD. (Please call to make an appointment in 1 week. )    Specialty:  Cardiology   Contact information:   [address] [place] [address] [phone]         Duration of Discharge Encounter: Greater than 30 minutes including physician and PA time.  Signed, Perry Mount PA-C 10/28/2013, 4:59 PM

## 2013-10-30 ENCOUNTER — Telehealth: Payer: Self-pay | Admitting: Cardiology

## 2013-10-30 NOTE — Telephone Encounter (Signed)
New message           Pt discharge papers states she needs to f/u with dr hochrein in 2 weeks in [place] / schedule is full, can you work pt in?

## 2013-10-30 NOTE — Telephone Encounter (Signed)
Unless someone cancels for [facility] on 6/10 there is no way to add any more to that schedule.  She could be seen there 6/24 at 11:45 am.

## 2013-10-31 NOTE — Telephone Encounter (Signed)
Follow up         Pam I don't have override access for [facility]

## 2013-10-31 NOTE — Telephone Encounter (Signed)
Routed to Pam Fleming, RN 

## 2013-11-03 NOTE — Telephone Encounter (Signed)
P/host scheduled for 6/24 at 2:15

## 2013-11-14 ENCOUNTER — Encounter: Payer: Self-pay | Admitting: *Deleted

## 2013-11-22 ENCOUNTER — Encounter: Payer: Self-pay | Admitting: Cardiology

## 2013-11-22 ENCOUNTER — Ambulatory Visit (INDEPENDENT_AMBULATORY_CARE_PROVIDER_SITE_OTHER): Payer: BC Managed Care – PPO | Admitting: Cardiology

## 2013-11-22 VITALS — BP 157/74 | HR 83 | Ht 62.5 in | Wt 247.0 lb

## 2013-11-22 DIAGNOSIS — I10 Essential (primary) hypertension: Secondary | ICD-10-CM

## 2013-11-22 DIAGNOSIS — I4891 Unspecified atrial fibrillation: Secondary | ICD-10-CM

## 2013-11-22 MED ORDER — DILTIAZEM HCL ER COATED BEADS 180 MG PO TB24: 180.0000 mg | ORAL_TABLET | Freq: Every day | ORAL | Status: DC

## 2013-11-22 NOTE — Patient Instructions (Signed)
Please start Cardizem 180 mg a day once you run out of the medication you have now. Continue all other medications as listed.  Follow up in 6 months with Dr Percival Spanish.  You will receive a letter in the mail 2 months before you are due.  Please call us when you receive this letter to schedule your follow up appointment.

## 2013-11-22 NOTE — Progress Notes (Signed)
HPI The patient presents for followup after recent hospitalization. I reviewed these records and he had atrial fibrillation. She spontaneously converted to sinus rhythm. She was seen by Dr. Lovena Le who prescribed pill in pocket flecainide upon discharge. She said she's had take this one time and her atrial fibrillation stopped after about 3 minutes. She otherwise has had no new complaints. She denies any chest pressure or or arm discomfort. She has had some discomfort in the back of her neck but this has been evaluated in the past with a stress test and was unremarkable. She otherwise denies any shortness of breath, PND or orthopnea. She's had no weight gain or edema.  No Known Allergies  Current Outpatient Prescriptions  Medication Sig Dispense Refill  . apixaban (ELIQUIS) 5 MG TABS tablet Take 1 tablet (5 mg total) by mouth 2 (two) times daily.  60 tablet  12  . diltiazem (CARDIZEM) 60 MG tablet Take 1 tablet (60 mg total) by mouth 3 (three) times daily.  90 tablet  11  . flecainide (TAMBOCOR) 150 MG tablet Take 2 tablets (300 mg total) by mouth as needed (As needed for atrial fibrillation).  30 tablet  0  . glyBURIDE micronized (GLYNASE) 3 MG tablet Take 3 mg by mouth daily.      . hydrochlorothiazide (HYDRODIURIL) 25 MG tablet Take 25 mg by mouth daily.      Marland Kitchen levothyroxine (SYNTHROID, LEVOTHROID) 112 MCG tablet Take 168 mcg by mouth daily.      Marland Kitchen losartan (COZAAR) 50 MG tablet Take 50 mg by mouth daily.      Marland Kitchen lovastatin (MEVACOR) 20 MG tablet Take 20 mg by mouth daily.      . metFORMIN (GLUCOPHAGE) 500 MG tablet Take 500 mg by mouth 2 (two) times daily.      . metoprolol succinate (TOPROL-XL) 100 MG 24 hr tablet Take 100 mg by mouth daily.       No current facility-administered medications for this visit.    Past Medical History  Diagnosis Date  . Diabetes mellitus without complication   . Hypertension   . Hypothyroid   . Arthritis   . PAF (paroxysmal atrial fibrillation)    a. newly dx in 09/2013; on eliquis    Past Surgical History  Procedure Laterality Date  . Tonsillectomy    . Dilation and curettage of uterus      x 5    ROS:  As stated in the HPI and negative for all other systems.  PHYSICAL EXAM BP 157/74  Pulse 83  Ht 5' 2.5" (1.588 m)  Wt 247 lb (112.038 kg)  BMI 44.43 kg/m2 GENERAL:  Well appearing HEENT:  Pupils equal round and reactive, fundi not visualized, oral mucosa unremarkable NECK:  No jugular venous distention, waveform within normal limits, carotid upstroke brisk and symmetric, no bruits, no thyromegaly LYMPHATICS:  No cervical, inguinal adenopathy LUNGS:  Clear to auscultation bilaterally BACK:  No CVA tenderness CHEST:  Unremarkable HEART:  PMI not displaced or sustained,S1 and S2 within normal limits, no S3, no S4, no clicks, no rubs, apical and right sternal border early peaking systolic murmur radiating slightly into the carotids, no diastolic murmurs ABD:  Flat, positive bowel sounds normal in frequency in pitch, no bruits, no rebound, no guarding, no midline pulsatile mass, no hepatomegaly, no splenomegaly EXT:  2 plus pulses throughout, no edema, no cyanosis no clubbing SKIN:  No rashes no nodules NEURO:  Cranial nerves II through XII grossly intact, motor  grossly intact throughout [facility] [place] [facility]:  Cognitively intact, oriented to person place and time  ASSESSMENT AND PLAN  ATRIAL FIB:  The patient will continue with the flecainide pill in pocket approach. However, if she is having to take this with any frequency we would need to reconsider therapy. Of note I will be getting a stress test she had a couple of years ago result. This was done in another system. I can't find that she will need to have stress testing to make sure she can continue with his therapy given her risk factors. I will be switching to Cardizem CD 180 mg.  HTN:   Her blood pressure is elevated but it was not particularly elevated otherwise. The blood pressure  continues to be high. I have instructed the patient to record a blood pressure diary and recording this. This will be presented for my review and pending these results I will make further suggestions about changes in therapy for optimal blood pressure control.

## 2013-11-24 ENCOUNTER — Telehealth: Payer: Self-pay | Admitting: Cardiology

## 2013-11-24 NOTE — Telephone Encounter (Signed)
ROI faxed to [facility] @ [phone]

## 2014-02-21 ENCOUNTER — Telehealth: Payer: Self-pay | Admitting: Cardiology

## 2014-02-21 NOTE — Telephone Encounter (Signed)
New problem   Pt is having sx 03/13/14 and need to be off Eliquis for 2 day. Please advise pt.

## 2014-02-22 NOTE — Telephone Encounter (Signed)
OK to hold Eliquis as needed for the procedure.  Call Ms. Viti with the results and send results to [name], MD

## 2014-02-23 NOTE — Telephone Encounter (Signed)
Follow up     Pt want to know if it is ok to hold her eliquis prior to surgery scheduled for 03-13-14.  Please call

## 2014-02-23 NOTE — Telephone Encounter (Signed)
Returned call to patient Dr.Hochrein advised ok to hold eliquis 2 days prior to upcoming surgery.

## 2014-04-11 ENCOUNTER — Telehealth: Payer: Self-pay | Admitting: *Deleted

## 2014-04-11 NOTE — Telephone Encounter (Signed)
Pt calling because she was in At Fib this am when she got up.  She took the Flecainide as ordered (pill in pocket) and seems to be back in NSR now.  She c/o feeling fatigued.  Reassurance given.  She will call back if further concerns.  She is scheduled for follow up with Dr Percival Spanish in December.

## 2014-05-15 ENCOUNTER — Encounter: Payer: Self-pay | Admitting: Cardiology

## 2014-05-15 ENCOUNTER — Ambulatory Visit (INDEPENDENT_AMBULATORY_CARE_PROVIDER_SITE_OTHER): Payer: BC Managed Care – PPO | Admitting: Cardiology

## 2014-05-15 VITALS — BP 140/70 | HR 72 | Ht 62.0 in | Wt 260.2 lb

## 2014-05-15 DIAGNOSIS — I10 Essential (primary) hypertension: Secondary | ICD-10-CM

## 2014-05-15 NOTE — Progress Notes (Signed)
HPI The patient presents for followup after recent hospitalization.  The patient has paroxysmal atrial fibrillation and is being treated with when necessary flecainide. She's used about 3 more doses since I last saw her but still thinks her episodes are infrequent    She otherwise has had no new complaints. She denies any chest pressure or or arm discomfort.  She otherwise denies any shortness of breath, PND or orthopnea. She's had no weight gain or edema.  She is being sent for a sleep study because of snoring and a small airway noted at the time of a recent surgery.    Allergies  Allergen Reactions  . Quinapril Hcl   . Statins     Current Outpatient Prescriptions  Medication Sig Dispense Refill  . apixaban (ELIQUIS) 5 MG TABS tablet Take 1 tablet (5 mg total) by mouth 2 (two) times daily. 60 tablet 12  . diltiazem (CARDIZEM LA) 180 MG 24 hr tablet Take 1 tablet (180 mg total) by mouth daily. 30 tablet 11  . flecainide (TAMBOCOR) 150 MG tablet Take 2 tablets (300 mg total) by mouth as needed (As needed for atrial fibrillation). 30 tablet 0  . glyBURIDE micronized (GLYNASE) 3 MG tablet Take 3 mg by mouth daily.    . hydrochlorothiazide (HYDRODIURIL) 25 MG tablet Take 25 mg by mouth daily.    Marland Kitchen levothyroxine (SYNTHROID, LEVOTHROID) 112 MCG tablet Take 168 mcg by mouth daily.    Marland Kitchen losartan (COZAAR) 50 MG tablet Take 50 mg by mouth daily.    Marland Kitchen lovastatin (MEVACOR) 20 MG tablet Take 20 mg by mouth daily.    . metFORMIN (GLUCOPHAGE) 500 MG tablet Take 500 mg by mouth 2 (two) times daily.    . metoprolol succinate (TOPROL-XL) 100 MG 24 hr tablet Take 100 mg by mouth daily.    . mupirocin ointment (BACTROBAN) 2 %   0   No current facility-administered medications for this visit.    Past Medical History  Diagnosis Date  . Diabetes mellitus without complication   . Hypertension   . Hypothyroid   . Arthritis   . PAF (paroxysmal atrial fibrillation)     a. newly dx in 09/2013; on eliquis      Past Surgical History  Procedure Laterality Date  . Tonsillectomy    . Dilation and curettage of uterus      x 5    ROS:  As stated in the HPI and negative for all other systems.  PHYSICAL EXAM BP 140/70 mmHg  Pulse 72  Ht 5\' 2"  (1.575 m)  Wt 260 lb 3.2 oz (118.026 kg)  BMI 47.58 kg/m2 GENERAL:  Well appearing HEENT:  Pupils equal round and reactive, fundi not visualized, oral mucosa unremarkable NECK:  No jugular venous distention, waveform within normal limits, carotid upstroke brisk and symmetric, no bruits, no thyromegaly LYMPHATICS:  No cervical, inguinal adenopathy LUNGS:  Clear to auscultation bilaterally BACK:  No CVA tenderness CHEST:  Unremarkable HEART:  PMI not displaced or sustained,S1 and S2 within normal limits, no S3, no S4, no clicks, no rubs, apical and right sternal border early peaking systolic murmur radiating slightly into the carotids, no diastolic murmurs ABD:  Flat, positive bowel sounds normal in frequency in pitch, no bruits, no rebound, no guarding, no midline pulsatile mass, no hepatomegaly, no splenomegaly EXT:  2 plus pulses throughout, no edema, no cyanosis no clubbing SKIN:  No rashes no nodules NEURO:  Cranial nerves II through XII grossly intact, motor grossly  intact throughout [facility]:  Cognitively intact, oriented to person place and time  EKG:   Sinus rhythm, rate 73 , axis leftward , intervals within normal limits , no acute ST-T wave changes.  05/15/2014  ASSESSMENT AND PLAN  ATRIAL FIB:  The patient will continue with the flecainide pill in pocket approach as her episodes are not frequent. However, if she is having to take this with any frequency we would need to reconsider therapy.  For now we will continue the current therapy.    HTN:  The blood pressure is at target. No change in medications is indicated. We will continue with therapeutic lifestyle changes (TLC).  SLEEP STUDY:  She will have a sleep study which was ordered by her  primary provider.

## 2014-05-15 NOTE — Patient Instructions (Signed)
Your physician recommends that you schedule a follow-up appointment in: one year with Dr. Hochrein  

## 2014-05-26 ENCOUNTER — Emergency Department (HOSPITAL_COMMUNITY): Payer: BC Managed Care – PPO

## 2014-05-26 ENCOUNTER — Emergency Department (HOSPITAL_COMMUNITY)
Admission: EM | Admit: 2014-05-26 | Discharge: 2014-05-26 | Disposition: A | Discharge status: Home / Self Care | Payer: BC Managed Care – PPO | Attending: Emergency Medicine | Admitting: Emergency Medicine

## 2014-05-26 ENCOUNTER — Encounter (HOSPITAL_COMMUNITY): Payer: Self-pay | Admitting: Emergency Medicine

## 2014-05-26 DIAGNOSIS — E039 Hypothyroidism, unspecified: Secondary | ICD-10-CM | POA: Diagnosis not present

## 2014-05-26 DIAGNOSIS — R110 Nausea: Secondary | ICD-10-CM | POA: Diagnosis not present

## 2014-05-26 DIAGNOSIS — Z8739 Personal history of other diseases of the musculoskeletal system and connective tissue: Secondary | ICD-10-CM | POA: Insufficient documentation

## 2014-05-26 DIAGNOSIS — R42 Dizziness and giddiness: Secondary | ICD-10-CM | POA: Insufficient documentation

## 2014-05-26 DIAGNOSIS — Z792 Long term (current) use of antibiotics: Secondary | ICD-10-CM | POA: Insufficient documentation

## 2014-05-26 DIAGNOSIS — R531 Weakness: Secondary | ICD-10-CM | POA: Insufficient documentation

## 2014-05-26 DIAGNOSIS — Z7901 Long term (current) use of anticoagulants: Secondary | ICD-10-CM | POA: Insufficient documentation

## 2014-05-26 DIAGNOSIS — E119 Type 2 diabetes mellitus without complications: Secondary | ICD-10-CM | POA: Insufficient documentation

## 2014-05-26 DIAGNOSIS — Z23 Encounter for immunization: Secondary | ICD-10-CM | POA: Diagnosis not present

## 2014-05-26 DIAGNOSIS — Z79899 Other long term (current) drug therapy: Secondary | ICD-10-CM | POA: Diagnosis not present

## 2014-05-26 DIAGNOSIS — R109 Unspecified abdominal pain: Secondary | ICD-10-CM | POA: Diagnosis not present

## 2014-05-26 DIAGNOSIS — R55 Syncope and collapse: Secondary | ICD-10-CM | POA: Insufficient documentation

## 2014-05-26 DIAGNOSIS — I10 Essential (primary) hypertension: Secondary | ICD-10-CM | POA: Insufficient documentation

## 2014-05-26 DIAGNOSIS — I480 Paroxysmal atrial fibrillation: Secondary | ICD-10-CM | POA: Diagnosis not present

## 2014-05-26 LAB — URINALYSIS, ROUTINE W REFLEX MICROSCOPIC
BILIRUBIN URINE: NEGATIVE
GLUCOSE, UA: 100 mg/dL — AB
Hgb urine dipstick: NEGATIVE
Nitrite: NEGATIVE
PH: 6 (ref 5.0–8.0)
Protein, ur: NEGATIVE mg/dL
Specific Gravity, Urine: 1.025 (ref 1.005–1.030)
Urobilinogen, UA: 0.2 mg/dL (ref 0.0–1.0)

## 2014-05-26 LAB — CBC WITH DIFFERENTIAL/PLATELET
BASOS PCT: 0 % (ref 0–1)
Basophils Absolute: 0 10*3/uL (ref 0.0–0.1)
EOS ABS: 0.1 10*3/uL (ref 0.0–0.7)
EOS PCT: 1 % (ref 0–5)
HCT: 34.6 % — ABNORMAL LOW (ref 36.0–46.0)
HEMOGLOBIN: 10.7 g/dL — AB (ref 12.0–15.0)
LYMPHS ABS: 0.3 10*3/uL — AB (ref 0.7–4.0)
Lymphocytes Relative: 3 % — ABNORMAL LOW (ref 12–46)
MCH: 26.4 pg (ref 26.0–34.0)
MCHC: 30.9 g/dL (ref 30.0–36.0)
MCV: 85.2 fL (ref 78.0–100.0)
MONOS PCT: 5 % (ref 3–12)
Monocytes Absolute: 0.5 10*3/uL (ref 0.1–1.0)
Neutro Abs: 10.5 10*3/uL — ABNORMAL HIGH (ref 1.7–7.7)
Neutrophils Relative %: 91 % — ABNORMAL HIGH (ref 43–77)
Platelets: 232 10*3/uL (ref 150–400)
RBC: 4.06 MIL/uL (ref 3.87–5.11)
RDW: 16 % — ABNORMAL HIGH (ref 11.5–15.5)
WBC: 11.4 10*3/uL — ABNORMAL HIGH (ref 4.0–10.5)

## 2014-05-26 LAB — COMPREHENSIVE METABOLIC PANEL
ALK PHOS: 100 U/L (ref 39–117)
ALT: 29 U/L (ref 0–35)
AST: 33 U/L (ref 0–37)
Albumin: 3.9 g/dL (ref 3.5–5.2)
Anion gap: 8 (ref 5–15)
BUN: 22 mg/dL (ref 6–23)
CALCIUM: 8.6 mg/dL (ref 8.4–10.5)
CO2: 26 mmol/L (ref 19–32)
CREATININE: 0.63 mg/dL (ref 0.50–1.10)
Chloride: 99 mEq/L (ref 96–112)
GFR calc Af Amer: >90 mL/min (ref >90)
GLUCOSE: 258 mg/dL — AB (ref 70–99)
POTASSIUM: 4 mmol/L (ref 3.5–5.1)
Sodium: 133 mmol/L — ABNORMAL LOW (ref 135–145)
Total Bilirubin: 1 mg/dL (ref 0.3–1.2)
Total Protein: 7.6 g/dL (ref 6.0–8.3)

## 2014-05-26 LAB — CBG MONITORING, ED: GLUCOSE-CAPILLARY: 253 mg/dL — AB (ref 70–99)

## 2014-05-26 LAB — PROTIME-INR
INR: 1.16 (ref 0.00–1.49)
Prothrombin Time: 15 seconds (ref 11.6–15.2)

## 2014-05-26 LAB — URINE MICROSCOPIC-ADD ON

## 2014-05-26 LAB — TROPONIN I
Troponin I: <0.03 ng/mL (ref <0.031)
Troponin I: <0.03 ng/mL (ref <0.031)

## 2014-05-26 MED ORDER — SODIUM CHLORIDE 0.9 % IV BOLUS (SEPSIS)
1000.0000 mL | Freq: Once | INTRAVENOUS | Status: AC
Start: 2014-05-26 — End: 2014-05-26
  Administered 2014-05-26: 1000 mL via INTRAVENOUS

## 2014-05-26 MED ORDER — TETANUS-DIPHTH-ACELL PERTUSSIS 5-2.5-18.5 LF-MCG/0.5 IM SUSP
0.5000 mL | Freq: Once | INTRAMUSCULAR | Status: AC
  Administered 2014-05-26: 0.5 mL via INTRAMUSCULAR
  Filled 2014-05-26: qty 0.5

## 2014-05-26 MED ORDER — SODIUM CHLORIDE 0.9 % IV BOLUS (SEPSIS): 1000.0000 mL | Freq: Once | INTRAVENOUS | Status: DC

## 2014-05-26 NOTE — ED Notes (Signed)
Patient verbalizes understanding of discharge instructions, home care and follow up care if needed. Patient ambulatory out of department at this time with family.

## 2014-05-26 NOTE — ED Notes (Signed)
Occult stool negative for blood

## 2014-05-26 NOTE — ED Notes (Signed)
States she sat up on the side of the bed and started to feel  Very funny. edp notified.

## 2014-05-26 NOTE — ED Notes (Signed)
Pt. Ambulated without assistance to bathroom. Pt was also able to drink water.

## 2014-05-26 NOTE — Discharge Instructions (Signed)
Near-Syncope Keep herself hydrated as discussed. Follow up with your doctor. Return to the ED if you develop new or worsening symptoms. Near-syncope (commonly known as near fainting) is sudden weakness, dizziness, or feeling like you might pass out. During an episode of near-syncope, you may also develop pale skin, have tunnel vision, or feel sick to your stomach (nauseous). Near-syncope may occur when getting up after sitting or while standing for a long time. It is caused by a sudden decrease in blood flow to the brain. This decrease can result from various causes or triggers, most of which are not serious. However, because near-syncope can sometimes be a sign of something serious, a medical evaluation is required. The specific cause is often not determined. HOME CARE INSTRUCTIONS  Monitor your condition for any changes. The following actions may help to alleviate any discomfort you are experiencing:  Have someone stay with you until you feel stable.  Lie down right away and prop your feet up if you start feeling like you might faint. Breathe deeply and steadily. Wait until all the symptoms have passed. Most of these episodes last only a few minutes. You may feel tired for several hours.   Drink enough fluids to keep your urine clear or pale yellow.   If you are taking blood pressure or heart medicine, get up slowly when seated or lying down. Take several minutes to sit and then stand. This can reduce dizziness.  Follow up with your health care provider as directed. SEEK IMMEDIATE MEDICAL CARE IF:   You have a severe headache.   You have unusual pain in the chest, abdomen, or back.   You are bleeding from the mouth or rectum, or you have black or tarry stool.   You have an irregular or very fast heartbeat.   You have repeated fainting or have seizure-like jerking during an episode.   You faint when sitting or lying down.   You have confusion.   You have difficulty walking.    You have severe weakness.   You have vision problems.  MAKE SURE YOU:   Understand these instructions.  Will watch your condition.  Will get help right away if you are not doing well or get worse. Document Released: 05/18/2005 Document Revised: 05/23/2013 Document Reviewed: 10/21/2012 [facility] Patient Information 2015 [place], [place]. This information is not intended to replace advice given to you by your health care provider. Make sure you discuss any questions you have with your health care provider.

## 2014-05-26 NOTE — ED Notes (Signed)
Pt given Sprite Zero and pack of crackers

## 2014-05-26 NOTE — ED Notes (Signed)
Patient able to tolerate po crackers and fluid without difficulty.

## 2014-05-26 NOTE — ED Provider Notes (Signed)
CSN: [id number]     Arrival date & time 05/26/14  1444 History  This chart was scribed for Emily Essex, MD by Stephania Fragmin, ED Scribe. This patient was seen in room APA12/APA12 and the patient's care was started at 3:08 PM.    Chief Complaint  Patient presents with  . Weakness   The history is provided by the patient. No language interpreter was used.    HPI Comments: Emily [name] is a 63 y.o. female who presents to the Emergency Department complaining of generalized weakness that began this morning. Patient reports that patient woke up today feeling fine and took her medication. She then felt weak, lightheaded, and diaphoretic before vomiting, which alleviated her symptoms. 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 Patient endorses a little abdominal pain that has since resolved. Patient took her BP reading at home that was around 100/50. Patient had similar symptoms 2 years ago that resolved without treatment. Patient is compliant with her medication, including pills for diabetes. She denies chest pain, SOB, fever, pain, headache, or visual disturbances. Her cat scratched her this morning; she can't remember her last tetanus shot. Patient reports that she ate and drank normally this morning. Dr. Collene Gobble is her PCP.   Past Medical History  Diagnosis Date  . Diabetes mellitus without complication   . Hypertension   . Hypothyroid   . Arthritis   . PAF (paroxysmal atrial fibrillation)     a. newly dx in 09/2013; on eliquis   Past Surgical History  Procedure Laterality Date  . Tonsillectomy    . Dilation and curettage of uterus      x 5   Family History  Problem Relation Age of Onset  . COPD Father   . Heart failure Father   . Heart disease Father   . Arrhythmia Sister   . Arrhythmia Sister   . Arrhythmia Sister     had PPM also  . Cancer Sister    History  Substance Use Topics  . Smoking status: Never Smoker   . Smokeless  tobacco: Never Used  . Alcohol Use: No   OB History    No data available     Review of Systems  A complete 10 system review of systems was obtained and all systems are negative except as noted in the HPI and PMH.    Allergies  Quinapril hcl and Statins  Home Medications   Prior to Admission medications   Medication Sig Start Date End Date Taking? Authorizing Provider  apixaban (ELIQUIS) 5 MG TABS tablet Take 1 tablet (5 mg total) by mouth 2 (two) times daily. 10/28/13  Yes Eileen Stanford, PA-C  diltiazem (CARDIZEM LA) 180 MG 24 hr tablet Take 1 tablet (180 mg total) by mouth daily. 11/22/13  Yes Minus Breeding, MD  doxycycline (VIBRA-TABS) 100 MG tablet Take 100 mg by mouth 2 (two) times daily. [place] ON 05/22/2014 05/22/14  Yes Historical Provider, MD  flecainide (TAMBOCOR) 150 MG tablet Take 2 tablets (300 mg total) by mouth as needed (As needed for atrial fibrillation). 10/28/13  Yes Eileen Stanford, PA-C  glyBURIDE micronized (GLYNASE) 3 MG tablet Take 0.5 mg by mouth 2 (two) times daily.  10/10/13  Yes Historical Provider, MD  hydrochlorothiazide (HYDRODIURIL) 25 MG tablet Take 25 mg by mouth daily. 08/02/13  Yes Historical Provider, MD  levothyroxine (SYNTHROID, LEVOTHROID) 112 MCG tablet Take 56-168 mcg by mouth See admin instructions. TAKES ONE  TABLET DAILY. TAKES ONE-HALF TABLET IN ADDITION, EVERY OTHER DAY ONLY   Yes Historical Provider, MD  losartan (COZAAR) 50 MG tablet Take 50 mg by mouth daily. 10/24/13  Yes Historical Provider, MD  lovastatin (MEVACOR) 20 MG tablet Take 20 mg by mouth every evening.  08/12/13  Yes Historical Provider, MD  metFORMIN (GLUCOPHAGE) 500 MG tablet Take 500 mg by mouth 2 (two) times daily. 09/26/13  Yes Historical Provider, MD  metoprolol succinate (TOPROL-XL) 100 MG 24 hr tablet Take 100 mg by mouth daily. 08/02/13  Yes Historical Provider, MD  mupirocin ointment (BACTROBAN) 2 % Apply 1 application topically 2 (two) times daily.  APPLICATION TO AFFECTED TOE(S) 05/08/14  Yes Historical Provider, MD   Triage Vitals: BP 110/50 mmHg  Pulse 64  Temp(Src) 97.7 F (36.5 C) (Oral)  Resp 16  SpO2 97%  Physical Exam  Constitutional: She is oriented to person, place, and time. She appears well-developed and well-nourished. No distress.  HENT:  Head: Normocephalic and atraumatic.  Mouth/Throat: Oropharynx is clear and moist. No oropharyngeal exudate.  Dry mucus membranes.   Eyes: Conjunctivae and EOM are normal. Pupils are equal, round, and reactive to light.  Neck: Normal range of motion. Neck supple.  No meningismus.  Cardiovascular: Normal rate, regular rhythm, normal heart sounds and intact distal pulses.   No murmur heard. Regular rhythm.  Pulmonary/Chest: Effort normal and breath sounds normal. No respiratory distress.  Lungs are clear.  Abdominal: Soft. There is no tenderness. There is no rebound and no guarding.  Musculoskeletal: Normal range of motion. She exhibits no edema or tenderness.  Superficial scratch L forearm.  No surrounding cellulitis  Neurological: She is alert and oriented to person, place, and time. No cranial nerve deficit. She exhibits normal muscle tone. Coordination normal.  No ataxia on finger to nose bilaterally. No pronator drift. 5/5 strength throughout. CN 2-12 intact. Equal grip strength. Sensation intact.   Skin: Skin is warm.  Psychiatric: She has a normal mood and affect. Her behavior is normal.  Nursing note and vitals reviewed.  ED Course  Procedures (including critical care time)  DIAGNOSTIC STUDIES: Oxygen Saturation is 97% on room air, normal by my interpretation.    COORDINATION OF CARE: 3:14 PM - Discussed treatment plan with pt at bedside which includes EKG reading and blood tests, and pt agreed to plan.  6:53 PM Patient states that she feels generalized weakness and nausea. She was able to ambulate without assistance to the bathroom.  9:09 PM - Discussed plans to  discharge.  Labs Review Labs Reviewed  CBC WITH DIFFERENTIAL - Abnormal; Notable for the following:    WBC 11.4 (*)    Hemoglobin 10.7 (*)    HCT 34.6 (*)    RDW 16.0 (*)    Neutrophils Relative % 91 (*)    Neutro Abs 10.5 (*)    Lymphocytes Relative 3 (*)    Lymphs Abs 0.3 (*)    All other components within normal limits  COMPREHENSIVE METABOLIC PANEL - Abnormal; Notable for the following:    Sodium 133 (*)    Glucose, Bld 258 (*)    All other components within normal limits  URINALYSIS, ROUTINE W REFLEX MICROSCOPIC - Abnormal; Notable for the following:    Glucose, UA 100 (*)    Ketones, ur TRACE (*)    Leukocytes, UA TRACE (*)    All other components within normal limits  CBG MONITORING, ED - Abnormal; Notable for the following:    Glucose-Capillary  253 (*)    All other components within normal limits  TROPONIN I  PROTIME-INR  URINE MICROSCOPIC-ADD ON  TROPONIN I  POC OCCULT BLOOD, ED    Imaging Review Dg Chest 2 View  05/26/2014   CLINICAL DATA:  63 year old female with episodes of dizziness weakness nausea and syncope today. Initial encounter.  EXAM: CHEST  2 VIEW  COMPARISON:  10/27/2013.  FINDINGS: Mild cardiomegaly. Other mediastinal contours are within normal limits. No pneumothorax, pulmonary edema, pleural effusion or confluent pulmonary opacity. Stable mild linear opacities in the left mid lung compatible with scarring or atelectasis. Calcified atherosclerosis of the aorta. No acute osseous abnormality identified.  IMPRESSION: No acute cardiopulmonary abnormality.   Electronically Signed   By: Lars Pinks M.D.   On: 05/26/2014 15:49   Ct Renal Stone Study  05/26/2014   CLINICAL DATA:  Generalized weakness for 1 day. Intermittent dizziness and diaphoresis. Vomiting  EXAM: CT ABDOMEN AND PELVIS WITHOUT CONTRAST  TECHNIQUE: Multidetector CT imaging of the abdomen and pelvis was performed following the standard protocol without IV contrast.  COMPARISON:  None.  FINDINGS:  Lower chest:  Lung bases are clear.  No pericardial fluid.  Hepatobiliary: No focal hepatic lesion on this noncontrast exam. Gallbladder is distended to 4.6 cm. There is no gallbladder inflammation. The common bile duct is normal caliber.  Pancreas: Pancreas is normal. No ductal dilatation. No pancreatic inflammation.  Spleen: Normal spleen.  Adrenals/urinary tract: Adrenal glands are normal. The kidneys, ureters, bladder normal.  Stomach/Bowel: Stomach, small bowel, cecum normal. The appendix is not identified but there is no evidence of. Cecal inflammation. The colon rectosigmoid colon are normal.  Vascular/Lymphatic: Abdominal aorta is normal caliber. There is no retroperitoneal or periportal lymphadenopathy. No pelvic lymphadenopathy.  Reproductive: Uterus and ovaries are normal. High-density linear material within the uterus consistent with prior endometrial procedure.  Musculoskeletal: No aggressive osseous lesion.  Other: No free fluid in the abdomen pelvis. No ventral hernia. No inguinal hernia.  IMPRESSION: 1. No acute abdominal or pelvic findings. 2. Mildly distended gallbladder without evidence of gallbladder inflammation. 3. Atherosclerotic calcification aorta.   Electronically Signed   By: Suzy Bouchard M.D.   On: 05/26/2014 20:54     EKG Interpretation   Date/Time:  Saturday May 26 2014 15:50:55 EST Ventricular Rate:  71 PR Interval:  146 QRS Duration: 92 QT Interval:  427 QTC Calculation: 464 R Axis:   21 Text Interpretation:  Sinus rhythm nonspecific ST changes now sinus rhythm  Confirmed by Proctor [phone]) on 05/26/2014 3:54:52 PM     MDM   Final diagnoses:  Weak  Abdominal pain  Near syncope   near syncopal episode with generalized weakness, nausea and lightheadedness. No chest pain, shortness of breath or palpitations. Hx atrial fibrillation on eliquis.  NSR now.  Did not fall or hit head.  Orthostatics negative. EKG normal sinus rhythm with similar  inferior lateral ST depressions compared to December 15  Troponin negative 2. Hemoglobin 10.7 from 11.5. Hemoccult negative. UA negative. Nonfocal neuro exam, able to ambulate. CBG 250s.  Patient feels improved after IVF and PO fluids. Suspect episode of hypotension or hypoglycemia causing near syncope. She is ambulatory and tolerating PO.  Follow up with PCP. Return precautions discussed.  BP 128/58 mmHg  Pulse 72  Temp(Src) 97.7 F (36.5 C) (Oral)  Resp 22  SpO2 96%   I personally performed the services described in this documentation, which was scribed in my presence. The recorded information  has been reviewed and is accurate.     Emily Essex, MD 05/27/14 [phone]

## 2014-05-26 NOTE — ED Notes (Signed)
Patient arrives via EMS with c/o generalized weakness starting at 0800 this morning. H/o afib but states she is not experiencing any chest pain, shortness of breath, or rapid HR. Alert/oriented x 4. Grips equal bilaterally. Speech normal per patient. States she started feeling bad about 0800, symptoms eased off, then got worse again while she was working.

## 2014-05-28 LAB — POC OCCULT BLOOD, ED: FECAL OCCULT BLD: NEGATIVE

## 2014-06-25 ENCOUNTER — Telehealth: Payer: Self-pay | Admitting: Cardiology

## 2014-06-25 NOTE — Telephone Encounter (Signed)
Returning your call. °

## 2014-06-25 NOTE — Telephone Encounter (Signed)
Pt scheduled for colonoscopy on 07-03-14.  Ok to hold Eliquis 24 hrs prior to procedure, restart night of or day after, depending on preference of GI?

## 2014-06-25 NOTE — Telephone Encounter (Signed)
Calling because she is going to have a colonoscopy and need to go off of her Eliquis and need to know how many days do she need to go off before the procedure . The procedure is on 07/03/2014.Emily [name] Please call    Thanks

## 2014-06-25 NOTE — Telephone Encounter (Signed)
Returned call to patient no answer.LMTC. 

## 2014-06-25 NOTE — Telephone Encounter (Signed)
Returned call to patient Emily [name] [facility] advised ok to hold Eliquis 24 hrs prior to procedure and may restart night or day after depending on preference of GI.

## 2014-07-03 DIAGNOSIS — K317 Polyp of stomach and duodenum: Secondary | ICD-10-CM | POA: Insufficient documentation

## 2014-10-31 ENCOUNTER — Other Ambulatory Visit: Payer: Self-pay | Admitting: *Deleted

## 2014-10-31 MED ORDER — APIXABAN 5 MG PO TABS: 5.0000 mg | ORAL_TABLET | Freq: Two times a day (BID) | ORAL | Status: DC

## 2014-11-27 ENCOUNTER — Other Ambulatory Visit: Payer: Self-pay | Admitting: Cardiology

## 2014-11-27 NOTE — Telephone Encounter (Signed)
REFILL 

## 2014-11-28 ENCOUNTER — Other Ambulatory Visit: Payer: Self-pay | Admitting: *Deleted

## 2014-11-28 MED ORDER — DILTIAZEM HCL ER COATED BEADS 180 MG PO TB24: 180.0000 mg | ORAL_TABLET | Freq: Every day | ORAL | Status: DC

## 2014-12-04 DIAGNOSIS — E039 Hypothyroidism, unspecified: Secondary | ICD-10-CM | POA: Diagnosis not present

## 2014-12-04 DIAGNOSIS — E119 Type 2 diabetes mellitus without complications: Secondary | ICD-10-CM | POA: Diagnosis not present

## 2014-12-04 DIAGNOSIS — I10 Essential (primary) hypertension: Secondary | ICD-10-CM | POA: Diagnosis not present

## 2014-12-04 DIAGNOSIS — E785 Hyperlipidemia, unspecified: Secondary | ICD-10-CM | POA: Diagnosis not present

## 2014-12-04 LAB — MICROALBUMIN, URINE: Microalb, Ur: 4.6

## 2015-01-01 DIAGNOSIS — Z1231 Encounter for screening mammogram for malignant neoplasm of breast: Secondary | ICD-10-CM | POA: Diagnosis not present

## 2015-01-01 LAB — HM MAMMOGRAPHY

## 2015-01-09 ENCOUNTER — Ambulatory Visit: Payer: Self-pay | Admitting: Cardiology

## 2015-01-31 ENCOUNTER — Ambulatory Visit (INDEPENDENT_AMBULATORY_CARE_PROVIDER_SITE_OTHER): Payer: Medicare Other | Admitting: Family Medicine

## 2015-01-31 ENCOUNTER — Encounter: Payer: Self-pay | Admitting: Family Medicine

## 2015-01-31 ENCOUNTER — Telehealth: Payer: Self-pay

## 2015-01-31 VITALS — BP 146/70 | HR 63 | Temp 98.5°F | Ht 62.0 in | Wt 250.6 lb

## 2015-01-31 DIAGNOSIS — J209 Acute bronchitis, unspecified: Secondary | ICD-10-CM

## 2015-01-31 MED ORDER — AMOXICILLIN-POT CLAVULANATE 875-125 MG PO TABS: 1.0000 | ORAL_TABLET | Freq: Two times a day (BID) | ORAL | Status: DC

## 2015-01-31 NOTE — Progress Notes (Signed)
   HPI  Patient presents today evaluation of acute illness  Patient explains that she's had cough, productive of green sputum,malaise, chills, and loss of appetite now for about 4-5 days. She's been in and out of healthcare facilities lately with her sick sister.  Her sister has renal failure and is actively dying in the hospital currently.  She states that she feels poorly and just wear to get checked out to make sure she didn't get worse while she was going through this f occult event with her family.  She states that she is tolerating food and fluid easily but has no appetite. No chest pain, dyspnea leg edema, palpitations  PMH: Smoking status noted ROS: Per HPI  Objective: BP 146/70 mmHg  Pulse 63  Temp(Src) 98.5 F (36.9 C) (Oral)  Ht 5\' 2"  (1.575 m)  Wt 250 lb 9.6 oz (113.671 kg)  BMI 45.82 kg/m2 Gen: NAD, alert, cooperative with exam HEENT: NCAT, EOMI, PERRL CV: RRR, good S1/S2, no murmur Resp: lear throughout, no wheezes, decreased lung sounds on the left upper and lower lung fields Ext: No edema, warm Neuro: Alert and oriented, No gross deficits  Assessment and plan:  # acute bronchitis, suspicion for pneumonia Treating as community acquired pneumonia With her being in and out of healthcare facilities and having such a difficult event with her family I will weantowards treating more aggressively given this illness Augmentin Yogurt or probiotic while taking Augmentin, call for concern for yeast infection   Meds ordered this encounter  Medications  . furosemide (LASIX) 20 MG tablet    Sig: Take 20 mg by mouth.  Marland Kitchen amoxicillin-clavulanate (AUGMENTIN) 875-125 MG per tablet    Sig: Take 1 tablet by mouth 2 (two) times daily.    Dispense:  14 tablet    Refill:  0    Laroy Apple, MD [facility] 01/31/2015, 2:40 PM

## 2015-01-31 NOTE — Telephone Encounter (Signed)
Medication Detail      Disp Refills Start End     flecainide (TAMBOCOR) 150 MG tablet 30 tablet 0 10/28/2013     Sig - Route: Take 2 tablets (300 mg total) by mouth as needed (As needed for atrial fibrillation). - Oral    Notes to Pharmacy: Take 300 mg (two tablets) if you have an episode of atrial fibrillation    E-Prescribing Status: Receipt confirmed by pharmacy (10/28/2013 4:59 PM EDT)

## 2015-01-31 NOTE — Patient Instructions (Signed)
Great to meet you!  Please wear a mask at teh hospital  Start the antibiotics ASAP, eat a yogurt a day  Call if you have concern for yeast infection  Pneumonia Pneumonia is an infection of the lungs.  CAUSES Pneumonia may be caused by bacteria or a virus. Usually, these infections are caused by breathing infectious particles into the lungs (respiratory tract). SIGNS AND SYMPTOMS   Cough.  Fever.  Chest pain.  Increased rate of breathing.  Wheezing.  Mucus production. DIAGNOSIS  If you have the common symptoms of pneumonia, your health care provider will typically confirm the diagnosis with a chest X-ray. The X-ray will show an abnormality in the lung (pulmonary infiltrate) if you have pneumonia. Other tests of your blood, urine, or sputum may be done to find the specific cause of your pneumonia. Your health care provider may also do tests (blood gases or pulse oximetry) to see how well your lungs are working. TREATMENT  Some forms of pneumonia may be spread to other people when you cough or sneeze. You may be asked to wear a mask before and during your exam. Pneumonia that is caused by bacteria is treated with antibiotic medicine. Pneumonia that is caused by the influenza virus may be treated with an antiviral medicine. Most other viral infections must run their course. These infections will not respond to antibiotics.  HOME CARE INSTRUCTIONS   Cough suppressants may be used if you are losing too much rest. However, coughing protects you by clearing your lungs. You should avoid using cough suppressants if you can.  Your health care provider may have prescribed medicine if he or she thinks your pneumonia is caused by bacteria or influenza. Finish your medicine even if you start to feel better.  Your health care provider may also prescribe an expectorant. This loosens the mucus to be coughed up.  Take medicines only as directed by your health care provider.  Do not smoke. Smoking  is a common cause of bronchitis and can contribute to pneumonia. If you are a smoker and continue to smoke, your cough may last several weeks after your pneumonia has cleared.  A cold steam vaporizer or humidifier in your room or home may help loosen mucus.  Coughing is often worse at night. Sleeping in a semi-upright position in a recliner or using a couple pillows under your head will help with this.  Get rest as you feel it is needed. Your body will usually let you know when you need to rest. PREVENTION A pneumococcal shot (vaccine) is available to prevent a common bacterial cause of pneumonia. This is usually suggested for:  People over 51 years old.  Patients on chemotherapy.  People with chronic lung problems, such as bronchitis or emphysema.  People with immune system problems. If you are over 65 or have a high risk condition, you may receive the pneumococcal vaccine if you have not received it before. In some countries, a routine influenza vaccine is also recommended. This vaccine can help prevent some cases of pneumonia.You may be offered the influenza vaccine as part of your care. If you smoke, it is time to quit. You may receive instructions on how to stop smoking. Your health care provider can provide medicines and counseling to help you quit. SEEK MEDICAL CARE IF: You have a fever. SEEK IMMEDIATE MEDICAL CARE IF:   Your illness becomes worse. This is especially true if you are elderly or weakened from any other disease.  You cannot control  your cough with suppressants and are losing sleep.  You begin coughing up blood.  You develop pain which is getting worse or is uncontrolled with medicines.  Any of the symptoms which initially brought you in for treatment are getting worse rather than better.  You develop shortness of breath or chest pain. MAKE SURE YOU:   Understand these instructions.  Will watch your condition.  Will get help right away if you are not doing  well or get worse. Document Released: 05/18/2005 Document Revised: 10/02/2013 Document Reviewed: 08/07/2010 [facility] Patient Information 2015 [place], [place]. This information is not intended to replace advice given to you by your health care provider. Make sure you discuss any questions you have with your health care provider.

## 2015-02-01 MED ORDER — FLECAINIDE ACETATE 150 MG PO TABS: 300.0000 mg | ORAL_TABLET | ORAL | Status: DC | PRN

## 2015-02-06 ENCOUNTER — Other Ambulatory Visit: Payer: Self-pay

## 2015-02-06 MED ORDER — DILTIAZEM HCL ER COATED BEADS 180 MG PO TB24: 180.0000 mg | ORAL_TABLET | Freq: Every day | ORAL | Status: DC

## 2015-02-12 ENCOUNTER — Telehealth: Payer: Self-pay

## 2015-02-12 NOTE — Telephone Encounter (Signed)
Tried to reach pt about her throat still hurting. Told her to give me a call back

## 2015-02-18 ENCOUNTER — Other Ambulatory Visit: Payer: Self-pay

## 2015-02-18 NOTE — Telephone Encounter (Signed)
[facility] to fax a PA form for her to receive Diltiazem 180mg  ER.

## 2015-04-24 DIAGNOSIS — Z961 Presence of intraocular lens: Secondary | ICD-10-CM | POA: Diagnosis not present

## 2015-04-24 DIAGNOSIS — H00011 Hordeolum externum right upper eyelid: Secondary | ICD-10-CM | POA: Diagnosis not present

## 2015-04-24 DIAGNOSIS — H2511 Age-related nuclear cataract, right eye: Secondary | ICD-10-CM | POA: Diagnosis not present

## 2015-05-06 ENCOUNTER — Other Ambulatory Visit: Payer: Self-pay | Admitting: *Deleted

## 2015-05-06 MED ORDER — APIXABAN 5 MG PO TABS: 5.0000 mg | ORAL_TABLET | Freq: Two times a day (BID) | ORAL | Status: DC

## 2015-05-07 ENCOUNTER — Ambulatory Visit: Payer: Self-pay | Admitting: Cardiology

## 2015-05-28 DIAGNOSIS — H2511 Age-related nuclear cataract, right eye: Secondary | ICD-10-CM | POA: Diagnosis not present

## 2015-05-28 DIAGNOSIS — Z961 Presence of intraocular lens: Secondary | ICD-10-CM | POA: Diagnosis not present

## 2015-05-28 DIAGNOSIS — H43393 Other vitreous opacities, bilateral: Secondary | ICD-10-CM | POA: Diagnosis not present

## 2015-05-28 DIAGNOSIS — E119 Type 2 diabetes mellitus without complications: Secondary | ICD-10-CM | POA: Diagnosis not present

## 2015-06-12 ENCOUNTER — Ambulatory Visit (INDEPENDENT_AMBULATORY_CARE_PROVIDER_SITE_OTHER): Payer: Medicare Other | Admitting: Cardiology

## 2015-06-12 ENCOUNTER — Encounter: Payer: Self-pay | Admitting: Cardiology

## 2015-06-12 VITALS — BP 172/78 | HR 68 | Ht 63.0 in | Wt 249.0 lb

## 2015-06-12 DIAGNOSIS — I10 Essential (primary) hypertension: Secondary | ICD-10-CM | POA: Diagnosis not present

## 2015-06-12 DIAGNOSIS — R0989 Other specified symptoms and signs involving the circulatory and respiratory systems: Secondary | ICD-10-CM

## 2015-06-12 DIAGNOSIS — I4891 Unspecified atrial fibrillation: Secondary | ICD-10-CM

## 2015-06-12 NOTE — Patient Instructions (Signed)
Medication Instructions:  The current medical regimen is effective;  continue present plan and medications.  Testing/Procedures: Your physician has requested that you have a carotid duplex. This test is an ultrasound of the carotid arteries in your neck. It looks at blood flow through these arteries that supply the brain with blood. Allow one hour for this exam. There are no restrictions or special instructions.  Follow-Up: Follow up in 1 year with Dr. Hochrein in [place].  You will receive a letter in the mail 2 months before you are due.  Please call us when you receive this letter to schedule your follow up appointment.  If you need a refill on your cardiac medications before your next appointment, please call your pharmacy.  Thank you for choosing [facility]!!       

## 2015-06-12 NOTE — Progress Notes (Signed)
HPI The patient presents for followup after recent hospitalization.  The patient has paroxysmal atrial fibrillation and is being treated with when necessary flecainide. She's used about 3 more doses since I last saw her but still thinks her episodes are infrequent     She did have anemia and she describes what sounds like a gastric polyp though I don't have this workup. This did not require her stopping her Eliquis for any extended periods. And she says with iron her blood counts have improved.  She otherwise has had no new complaints. She denies any chest pressure or or arm discomfort.  She otherwise denies any shortness of breath, PND or orthopnea. She's had no weight gain or edema.  She has had  A positive sleep study and is now being treated for CPAP. I did review emergency room records from December 26 of last year. She was there with some lightheadedness is treated with hydration.    Allergies  Allergen Reactions  . Quinapril Hcl Cough  . Statins Other (See Comments)    PAIN IN LEGS    Current Outpatient Prescriptions  Medication Sig Dispense Refill  . apixaban (ELIQUIS) 5 MG TABS tablet Take 1 tablet (5 mg total) by mouth 2 (two) times daily. 60 tablet 2  . diltiazem (CARDIZEM LA) 180 MG 24 hr tablet Take 1 tablet (180 mg total) by mouth daily. **Please disregard the rx that was sent in for #15. Thanks** 90 tablet 1  . flecainide (TAMBOCOR) 150 MG tablet Take 2 tablets (300 mg total) by mouth as needed (As needed for atrial fibrillation). 60 tablet 2  . furosemide (LASIX) 20 MG tablet 20 mg. Take one tablet my mouth as needed    . glipiZIDE (GLUCOTROL) 5 MG tablet Take by mouth 2 (two) times daily before a meal.     . hydrochlorothiazide (HYDRODIURIL) 25 MG tablet Take 25 mg by mouth daily.    Marland Kitchen levothyroxine (SYNTHROID, LEVOTHROID) 112 MCG tablet Take 56-168 mcg by mouth See admin instructions. TAKES ONE TABLET DAILY. TAKES ONE-HALF TABLET IN ADDITION, EVERY OTHER DAY ONLY    .  losartan (COZAAR) 50 MG tablet Take 50 mg by mouth daily.    Marland Kitchen lovastatin (MEVACOR) 20 MG tablet Take 20 mg by mouth every evening.     . metFORMIN (GLUCOPHAGE) 500 MG tablet Take 500 mg by mouth 2 (two) times daily.    . metoprolol succinate (TOPROL-XL) 100 MG 24 hr tablet Take 100 mg by mouth daily.    . mupirocin ointment (BACTROBAN) 2 % Apply 1 application topically 2 (two) times daily. APPLICATION TO AFFECTED TOE(S)  0   No current facility-administered medications for this visit.    Past Medical History  Diagnosis Date  . Diabetes mellitus without complication ([place])   . Hypertension   . Hypothyroid   . Arthritis   . PAF (paroxysmal atrial fibrillation) ([place])     a. newly dx in 09/2013; on eliquis    Past Surgical History  Procedure Laterality Date  . Tonsillectomy    . Dilation and curettage of uterus      x 5  . Cyst on back of neck removed      ROS:  As stated in the HPI and negative for all other systems.  PHYSICAL EXAM BP 172/78 mmHg  Pulse 68  Ht 5\' 3"  (1.6 m)  Wt 249 lb (112.946 kg)  BMI 44.12 kg/m2 GENERAL:  Well appearing HEENT:  Pupils equal round and reactive, fundi  not visualized, oral mucosa unremarkable NECK:  No jugular venous distention, waveform within normal limits, carotid upstroke brisk and symmetric, bilateral carotid bruits, no thyromegaly LYMPHATICS:  No cervical, inguinal adenopathy LUNGS:  Clear to auscultation bilaterally BACK:  No CVA tenderness CHEST:  Unremarkable HEART:  PMI not displaced or sustained,S1 and S2 within normal limits, no S3, no S4, no clicks, no rubs, apical and right sternal border early peaking systolic murmur radiating slightly into the carotids, no diastolic murmurs ABD:  Flat, positive bowel sounds normal in frequency in pitch, no bruits, no rebound, no guarding, no midline pulsatile mass, no hepatomegaly, no splenomegaly EXT:  2 plus pulses throughout, no edema, no cyanosis no clubbing SKIN:  No rashes no  nodules   EKG:   Sinus rhythm, rate 68, axis leftward , intervals within normal limits , no acute ST-T wave changes, nonspecific inferior T wave changes unchanged from previous.  06/12/2015  ASSESSMENT AND PLAN  ATRIAL FIB:  The patient will continue with the flecainide pill in pocket approach as her episodes are not frequent. However, if she is having to take this with any frequency we would need to reconsider therapy.  For now we will continue the current therapy.  Ms. Simrin Glassberg has a CHA2DS2 - VASc score of 4 with a risk of stroke of 4%.  HTN:  The blood pressure is elevated. She will keep a blood pressure diary. We will treat this according to those readings.  BRUIT:  She will have carotid Dopplers.  HTN:  The blood pressure is at target. No change in medications is indicated. We will continue with therapeutic lifestyle changes (TLC).

## 2015-06-13 ENCOUNTER — Other Ambulatory Visit: Payer: Self-pay | Admitting: Family Medicine

## 2015-06-13 DIAGNOSIS — I6523 Occlusion and stenosis of bilateral carotid arteries: Secondary | ICD-10-CM

## 2015-06-18 ENCOUNTER — Ambulatory Visit (HOSPITAL_COMMUNITY)
Admission: RE | Admit: 2015-06-18 | Discharge: 2015-06-18 | Disposition: A | Discharge status: Home / Self Care | Payer: Medicare Other | Source: Ambulatory Visit | Attending: Cardiovascular Disease | Admitting: Cardiovascular Disease

## 2015-06-18 DIAGNOSIS — I10 Essential (primary) hypertension: Secondary | ICD-10-CM | POA: Diagnosis not present

## 2015-06-18 DIAGNOSIS — I6523 Occlusion and stenosis of bilateral carotid arteries: Secondary | ICD-10-CM | POA: Insufficient documentation

## 2015-06-18 DIAGNOSIS — E119 Type 2 diabetes mellitus without complications: Secondary | ICD-10-CM | POA: Diagnosis not present

## 2015-06-18 DIAGNOSIS — R0989 Other specified symptoms and signs involving the circulatory and respiratory systems: Secondary | ICD-10-CM | POA: Insufficient documentation

## 2015-07-02 DIAGNOSIS — Z23 Encounter for immunization: Secondary | ICD-10-CM | POA: Diagnosis not present

## 2015-07-02 DIAGNOSIS — E039 Hypothyroidism, unspecified: Secondary | ICD-10-CM | POA: Diagnosis not present

## 2015-07-02 DIAGNOSIS — I10 Essential (primary) hypertension: Secondary | ICD-10-CM | POA: Diagnosis not present

## 2015-07-02 DIAGNOSIS — E785 Hyperlipidemia, unspecified: Secondary | ICD-10-CM | POA: Diagnosis not present

## 2015-07-02 DIAGNOSIS — R0989 Other specified symptoms and signs involving the circulatory and respiratory systems: Secondary | ICD-10-CM | POA: Diagnosis not present

## 2015-07-02 DIAGNOSIS — E119 Type 2 diabetes mellitus without complications: Secondary | ICD-10-CM | POA: Diagnosis not present

## 2015-07-02 LAB — HEMOGLOBIN A1C: HEMOGLOBIN A1C: 7.7

## 2015-07-05 ENCOUNTER — Ambulatory Visit (INDEPENDENT_AMBULATORY_CARE_PROVIDER_SITE_OTHER): Payer: Medicare Other | Admitting: Cardiovascular Disease

## 2015-07-05 ENCOUNTER — Encounter: Payer: Self-pay | Admitting: Cardiovascular Disease

## 2015-07-05 VITALS — BP 158/82 | HR 62 | Ht 62.5 in | Wt 248.0 lb

## 2015-07-05 DIAGNOSIS — I10 Essential (primary) hypertension: Secondary | ICD-10-CM | POA: Diagnosis not present

## 2015-07-05 DIAGNOSIS — I779 Disorder of arteries and arterioles, unspecified: Secondary | ICD-10-CM | POA: Diagnosis not present

## 2015-07-05 DIAGNOSIS — I6523 Occlusion and stenosis of bilateral carotid arteries: Secondary | ICD-10-CM

## 2015-07-05 DIAGNOSIS — Z01818 Encounter for other preprocedural examination: Secondary | ICD-10-CM | POA: Diagnosis not present

## 2015-07-05 DIAGNOSIS — I739 Peripheral vascular disease, unspecified: Principal | ICD-10-CM

## 2015-07-05 NOTE — Assessment & Plan Note (Signed)
Emily [name] was referred to me by Dr. Percival Spanish for evaluation of high-grade left internal carotid artery stenosis. She also has moderate right ICA stenosis. The Doppler was ordered because of an auscultated bruit. She is neurologically asymptomatic. She has no history of prior stroke nor does she have history of heart disease. She does have proximal atrial fib maintaining sinus rhythm on oral and a coagulation. She is not high risk for endarterectomy. I am going to get a 2-D echo and a pharmacologic Myoview stress test to risk stratify her. If these come back normal she would be an excellent candidate for endarterectomy and I will refer her to vascular surgery ( Dr. Trula Slade).

## 2015-07-05 NOTE — Patient Instructions (Addendum)
Medication Instructions:  Your physician recommends that you continue on your current medications as directed. Please refer to the Current Medication list given to you today.   Labwork: none  Testing/Procedures: Your physician has requested that you have an echocardiogram. Echocardiography is a painless test that uses sound waves to create images of your heart. It provides your doctor with information about the size and shape of your heart and how well your heart's chambers and valves are working. This procedure takes approximately one hour. There are no restrictions for this procedure.  Your physician has requested that you have a lexiscan myoview. For further information please visit [email or web address]. Please follow instruction sheet, as given.    Follow-Up: You have been referred to Dr. Russella Dar for Carotid Edarterectobmy  Follow up with Dr. Gwenlyn Found as needed.   Any Other Special Instructions Will Be Listed Below (If Applicable).    If you need a refill on your cardiac medications before your next appointment, please call your pharmacy.

## 2015-07-05 NOTE — Progress Notes (Signed)
07/05/2015 Emily [name]   [birth year]  [id number]  Primary Physician Kenn File, MD Primary Cardiologist: [name] MD Renae Gloss   HPI:  Emily [name] is a 65 year old moderately overweight single Caucasian female with no children referred to me by Dr. Percival Spanish  for peripheral vascular evaluation because of a newly demonstrated high-grade left internal carotid artery stenosis. In addition, she has moderate right ICA stenosis. She has never had a heart attack or stroke. She does have a history of treated diabetes, hypertension and hyperlipidemia as well as proximal atrial fibrillation maintaining sinus rhythm on oral anticoagulation. She has no high-risk features for endarterectomy.    Current Outpatient Prescriptions  Medication Sig Dispense Refill  . apixaban (ELIQUIS) 5 MG TABS tablet Take 1 tablet (5 mg total) by mouth 2 (two) times daily. 60 tablet 2  . diltiazem (CARDIZEM LA) 180 MG 24 hr tablet Take 1 tablet (180 mg total) by mouth daily. **Please disregard the rx that was sent in for #15. Thanks** 90 tablet 1  . flecainide (TAMBOCOR) 150 MG tablet Take 2 tablets (300 mg total) by mouth as needed (As needed for atrial fibrillation). 60 tablet 2  . furosemide (LASIX) 20 MG tablet 20 mg. Take one tablet my mouth as needed    . glipiZIDE (GLUCOTROL) 5 MG tablet Take by mouth 2 (two) times daily before a meal.     . hydrochlorothiazide (HYDRODIURIL) 25 MG tablet Take 25 mg by mouth daily.    Marland Kitchen levothyroxine (SYNTHROID, LEVOTHROID) 112 MCG tablet Take 56-168 mcg by mouth See admin instructions. TAKES ONE TABLET DAILY. TAKES ONE-HALF TABLET IN ADDITION, EVERY OTHER DAY ONLY    . losartan (COZAAR) 50 MG tablet Take 50 mg by mouth daily.    Marland Kitchen lovastatin (MEVACOR) 20 MG tablet Take 20 mg by mouth every evening.     . metFORMIN (GLUCOPHAGE) 500 MG tablet Take 500 mg by mouth 2 (two) times daily.    . metoprolol succinate (TOPROL-XL) 100 MG 24 hr tablet Take 100  mg by mouth daily.     No current facility-administered medications for this visit.    Allergies  Allergen Reactions  . Quinapril Hcl Cough  . Statins Other (See Comments)    PAIN IN LEGS    Social History   Social History  . Marital Status: Single    Spouse Name: N/A  . Number of Children: N/A  . Years of Education: N/A   Occupational History  . Not on file.   Social History Main Topics  . Smoking status: Never Smoker   . Smokeless tobacco: Never Used  . Alcohol Use: No  . Drug Use: No  . Sexual Activity: Not on file   Other Topics Concern  . Not on file   Social History Narrative     Review of Systems: General: negative for chills, fever, night sweats or weight changes.  Cardiovascular: negative for chest pain, dyspnea on exertion, edema, orthopnea, palpitations, paroxysmal nocturnal dyspnea or shortness of breath Dermatological: negative for rash Respiratory: negative for cough or wheezing Urologic: negative for hematuria Abdominal: negative for nausea, vomiting, diarrhea, bright red blood per rectum, melena, or hematemesis Neurologic: negative for visual changes, syncope, or dizziness All other systems reviewed and are otherwise negative except as noted above.    Blood pressure 158/82, pulse 62, height 5' 2.5" (1.588 m), weight 248 lb (112.492 kg).  General appearance: alert and no distress Neck: no adenopathy, no JVD, supple, symmetrical, trachea midline, thyroid  not enlarged, symmetric, no tenderness/mass/nodules and soft bilateral carotid bruits Lungs: clear to auscultation bilaterally Heart: regular rate and rhythm, S1, S2 normal, no murmur, click, rub or gallop Extremities: extremities normal, atraumatic, no cyanosis or edema  EKG not performed today  ASSESSMENT AND PLAN:   Bilateral carotid artery disease (HCC) Emily [name] was referred to me by Dr. Percival Spanish for evaluation of high-grade left internal carotid artery stenosis. She also has  moderate right ICA stenosis. The Doppler was ordered because of an auscultated bruit. She is neurologically asymptomatic. She has no history of prior stroke nor does she have history of heart disease. She does have proximal atrial fib maintaining sinus rhythm on oral and a coagulation. She is not high risk for endarterectomy. I am going to get a 2-D echo and a pharmacologic Myoview stress test to risk stratify her. If these come back normal she would be an excellent candidate for endarterectomy and I will refer her to vascular surgery ( Dr. Trula Slade).       [name] MD FACP,FACC,FAHA, [facility] 07/05/2015 3:46 PM

## 2015-07-09 ENCOUNTER — Ambulatory Visit (INDEPENDENT_AMBULATORY_CARE_PROVIDER_SITE_OTHER): Payer: Medicare Other | Admitting: *Deleted

## 2015-07-09 DIAGNOSIS — Z23 Encounter for immunization: Secondary | ICD-10-CM | POA: Diagnosis not present

## 2015-07-09 NOTE — Progress Notes (Signed)
Pt given Prevnar 13 injection IM left deltoid and tolerated well.

## 2015-07-10 ENCOUNTER — Encounter: Payer: Self-pay | Admitting: *Deleted

## 2015-07-11 ENCOUNTER — Telehealth (HOSPITAL_COMMUNITY): Payer: Self-pay

## 2015-07-11 NOTE — Telephone Encounter (Signed)
Encounter complete. 

## 2015-07-16 ENCOUNTER — Ambulatory Visit (HOSPITAL_COMMUNITY)
Admission: RE | Admit: 2015-07-16 | Discharge: 2015-07-16 | Disposition: A | Discharge status: Home / Self Care | Payer: Medicare Other | Source: Ambulatory Visit | Attending: Cardiology | Admitting: Cardiology

## 2015-07-16 DIAGNOSIS — E669 Obesity, unspecified: Secondary | ICD-10-CM | POA: Diagnosis not present

## 2015-07-16 DIAGNOSIS — R55 Syncope and collapse: Secondary | ICD-10-CM | POA: Diagnosis not present

## 2015-07-16 DIAGNOSIS — G4733 Obstructive sleep apnea (adult) (pediatric): Secondary | ICD-10-CM | POA: Insufficient documentation

## 2015-07-16 DIAGNOSIS — I779 Disorder of arteries and arterioles, unspecified: Secondary | ICD-10-CM | POA: Diagnosis not present

## 2015-07-16 DIAGNOSIS — E119 Type 2 diabetes mellitus without complications: Secondary | ICD-10-CM | POA: Diagnosis not present

## 2015-07-16 DIAGNOSIS — Z6841 Body Mass Index (BMI) 40.0 and over, adult: Secondary | ICD-10-CM | POA: Insufficient documentation

## 2015-07-16 DIAGNOSIS — Z8249 Family history of ischemic heart disease and other diseases of the circulatory system: Secondary | ICD-10-CM | POA: Insufficient documentation

## 2015-07-16 DIAGNOSIS — R002 Palpitations: Secondary | ICD-10-CM | POA: Diagnosis not present

## 2015-07-16 DIAGNOSIS — I739 Peripheral vascular disease, unspecified: Secondary | ICD-10-CM

## 2015-07-16 DIAGNOSIS — I4891 Unspecified atrial fibrillation: Secondary | ICD-10-CM

## 2015-07-16 DIAGNOSIS — M542 Cervicalgia: Secondary | ICD-10-CM | POA: Diagnosis not present

## 2015-07-16 DIAGNOSIS — I10 Essential (primary) hypertension: Secondary | ICD-10-CM | POA: Insufficient documentation

## 2015-07-16 MED ORDER — AMINOPHYLLINE 25 MG/ML IV SOLN
100.0000 mg | Freq: Once | INTRAVENOUS | Status: AC
  Administered 2015-07-16: 100 mg via INTRAVENOUS

## 2015-07-16 MED ORDER — TECHNETIUM TC 99M SESTAMIBI GENERIC - CARDIOLITE
30.2000 | Freq: Once | INTRAVENOUS | Status: AC | PRN
  Administered 2015-07-16: 30.2 via INTRAVENOUS

## 2015-07-16 MED ORDER — REGADENOSON 0.4 MG/5ML IV SOLN
0.4000 mg | Freq: Once | INTRAVENOUS | Status: AC
  Administered 2015-07-16: 0.4 mg via INTRAVENOUS

## 2015-07-17 ENCOUNTER — Ambulatory Visit (HOSPITAL_COMMUNITY)
Admission: RE | Admit: 2015-07-17 | Discharge: 2015-07-17 | Disposition: A | Discharge status: Home / Self Care | Payer: Medicare Other | Source: Ambulatory Visit | Attending: Cardiovascular Disease | Admitting: Cardiovascular Disease

## 2015-07-17 ENCOUNTER — Encounter: Payer: No Typology Code available for payment source | Admitting: Cardiovascular Disease

## 2015-07-17 ENCOUNTER — Ambulatory Visit (HOSPITAL_BASED_OUTPATIENT_CLINIC_OR_DEPARTMENT_OTHER)
Admission: RE | Admit: 2015-07-17 | Discharge: 2015-07-17 | Disposition: A | Discharge status: Home / Self Care | Payer: Medicare Other | Source: Ambulatory Visit | Attending: Cardiovascular Disease | Admitting: Cardiovascular Disease

## 2015-07-17 ENCOUNTER — Telehealth: Payer: Self-pay | Admitting: *Deleted

## 2015-07-17 DIAGNOSIS — E785 Hyperlipidemia, unspecified: Secondary | ICD-10-CM | POA: Diagnosis not present

## 2015-07-17 DIAGNOSIS — I10 Essential (primary) hypertension: Secondary | ICD-10-CM

## 2015-07-17 DIAGNOSIS — I517 Cardiomegaly: Secondary | ICD-10-CM | POA: Insufficient documentation

## 2015-07-17 DIAGNOSIS — I779 Disorder of arteries and arterioles, unspecified: Secondary | ICD-10-CM

## 2015-07-17 DIAGNOSIS — E119 Type 2 diabetes mellitus without complications: Secondary | ICD-10-CM | POA: Diagnosis not present

## 2015-07-17 DIAGNOSIS — I739 Peripheral vascular disease, unspecified: Principal | ICD-10-CM

## 2015-07-17 LAB — MYOCARDIAL PERFUSION IMAGING
CHL CUP NUCLEAR SDS: 1
CHL CUP NUCLEAR SRS: 1
CHL CUP RESTING HR STRESS: 70 {beats}/min
LV dias vol: 121 mL
LV sys vol: 42 mL
Peak HR: 96 {beats}/min
SSS: 2
TID: 1.1

## 2015-07-17 MED ORDER — APIXABAN 5 MG PO TABS: 5.0000 mg | ORAL_TABLET | Freq: Two times a day (BID) | ORAL | Status: DC

## 2015-07-17 MED ORDER — TECHNETIUM TC 99M SESTAMIBI GENERIC - CARDIOLITE
29.0000 | Freq: Once | INTRAVENOUS | Status: AC | PRN
  Administered 2015-07-17: 29 via INTRAVENOUS

## 2015-07-17 NOTE — Telephone Encounter (Signed)
Samples eliquis x2 given

## 2015-08-05 ENCOUNTER — Encounter: Payer: No Typology Code available for payment source | Admitting: Surgery

## 2015-08-05 ENCOUNTER — Encounter: Payer: Self-pay | Admitting: Surgery

## 2015-08-12 ENCOUNTER — Encounter: Payer: Self-pay | Admitting: Surgery

## 2015-08-12 ENCOUNTER — Ambulatory Visit (INDEPENDENT_AMBULATORY_CARE_PROVIDER_SITE_OTHER): Payer: Medicare Other | Admitting: Surgery

## 2015-08-12 VITALS — BP 173/71 | HR 57 | Ht 62.0 in | Wt 250.0 lb

## 2015-08-12 DIAGNOSIS — I6523 Occlusion and stenosis of bilateral carotid arteries: Secondary | ICD-10-CM

## 2015-08-12 NOTE — Progress Notes (Signed)
Patient name: Emily [name] MRN: [id number] DOB: 1950/10/26 Sex: female   Referred by: Dr. Gwenlyn Found  Reason for referral:  Chief Complaint  Patient presents with  . New Evaluation    eval bilateral carotid stenosis     HISTORY OF PRESENT ILLNESS: This is a 65 year old female who comes in today for evaluation of bilateral carotid stenosis, left greater than right.  She is asymptomatic.  Specifically, she denies numbness or weakness in either extremity.  She denies slurred speech.  She denies amaurosis fugax.  Her carotid disease was initially detected by auscultation of a bruit which led to an ultrasound.  The patient suffers from diabetes.  This is moderately well controlled, her last hemoglobin A1c was 7.7.  The patient is treated for hypercholesterolemia with a statin.  She is medically managed for hypertension.  She is on anticoagulation for atrial fibrillation.  She suffers from sleep apnea and uses a C Pap.  She states that she has been told she has a very small airway  Past Medical History  Diagnosis Date  . Diabetes mellitus without complication ([place])   . Hypertension   . Hypothyroid   . Arthritis   . PAF (paroxysmal atrial fibrillation) ([place])     a. newly dx in 09/2013; on eliquis  . Bilateral carotid artery disease ([place])   . Obesity   . Obstructive sleep apnea     on C Pap    Past Surgical History  Procedure Laterality Date  . Tonsillectomy    . Dilation and curettage of uterus      x 5  . Cyst on back of neck removed      Social History   Social History  . Marital Status: Single    Spouse Name: N/A  . Number of Children: N/A  . Years of Education: N/A   Occupational History  . Not on file.   Social History Main Topics  . Smoking status: Never Smoker   . Smokeless tobacco: Never Used  . Alcohol Use: No  . Drug Use: No  . Sexual Activity: Not on file   Other Topics Concern  . Not on file   Social History Narrative    Family History  Problem  Relation Age of Onset  . COPD Father   . Heart failure Father   . Heart disease Father   . Arrhythmia Sister   . Arrhythmia Sister   . Arrhythmia Sister     had PPM also  . Cancer Sister     Allergies as of 08/12/2015 - Review Complete 08/12/2015  Allergen Reaction Noted  . Quinapril hcl Cough 05/15/2014  . Statins Other (See Comments) 05/15/2014    Current Outpatient Prescriptions on File Prior to Visit  Medication Sig Dispense Refill  . apixaban (ELIQUIS) 5 MG TABS tablet Take 1 tablet (5 mg total) by mouth 2 (two) times daily. 60 tablet 2  . diltiazem (CARDIZEM LA) 180 MG 24 hr tablet Take 1 tablet (180 mg total) by mouth daily. **Please disregard the rx that was sent in for #15. Thanks** 90 tablet 1  . flecainide (TAMBOCOR) 150 MG tablet Take 2 tablets (300 mg total) by mouth as needed (As needed for atrial fibrillation). 60 tablet 2  . furosemide (LASIX) 20 MG tablet 20 mg. Take one tablet my mouth as needed    . glipiZIDE (GLUCOTROL) 5 MG tablet Take by mouth 2 (two) times daily before a meal.     . hydrochlorothiazide (HYDRODIURIL) 25  MG tablet Take 25 mg by mouth daily.    Marland Kitchen levothyroxine (SYNTHROID, LEVOTHROID) 112 MCG tablet Take 56-168 mcg by mouth See admin instructions. TAKES ONE TABLET DAILY. TAKES ONE-HALF TABLET IN ADDITION, EVERY OTHER DAY ONLY    . losartan (COZAAR) 50 MG tablet Take 50 mg by mouth daily.    Marland Kitchen lovastatin (MEVACOR) 20 MG tablet Take 20 mg by mouth every evening.     . metFORMIN (GLUCOPHAGE) 500 MG tablet Take 500 mg by mouth 2 (two) times daily.    . metoprolol succinate (TOPROL-XL) 100 MG 24 hr tablet Take 100 mg by mouth daily.     No current facility-administered medications on file prior to visit.     REVIEW OF SYSTEMS: See history of present illness for pertinent positives and negatives.  Otherwise all systems are negative  PHYSICAL EXAMINATION:  Filed Vitals:   08/12/15 1229 08/12/15 1238  BP: 163/75 173/71  Pulse: 57   Height: 5\' 2"   (1.575 m)   Weight: 250 lb (113.399 kg)   SpO2: 97%    Body mass index is 45.71 kg/(m^2). General: The patient appears their stated age.   HEENT:  No gross abnormalities Pulmonary: Respirations are non-labored Abdomen: Soft and non-tender  Musculoskeletal: There are no major deformities.   Neurologic: No focal weakness or paresthesias are detected, Skin: There are no ulcer or rashes noted. Psychiatric: The patient has normal affect. Cardiovascular: There is a regular rate and rhythm with systolic ejection murmur  Bilateral carotid bruit  Diagnostic Studies: I have reviewed her outside ultrasound which shows greater than 80% left carotid stenosis and 60-79% right carotid stenosis.  The bifurcation is at the hyoid.  The internal carotid artery is normal past the stenosis   Assessment:  Asymptomatic bilateral carotid stenosis, left greater than right Plan: I discussed with the patient proceeding with left carotid endarterectomy.  We discussed the risks and benefits which include but are not limited to the risk of nerve injury, the risk of stroke, and cardiopulmonary complications.  Her operation has been scheduled for Friday, March 31.  She will need to be off Eliquis for 72 hours.     Eldridge Abrahams, M.D. [facility] Office: [phone] Pager:  [phone]

## 2015-08-14 ENCOUNTER — Other Ambulatory Visit: Payer: Self-pay

## 2015-08-19 ENCOUNTER — Telehealth: Payer: Self-pay | Admitting: Cardiology

## 2015-08-19 ENCOUNTER — Other Ambulatory Visit: Payer: Self-pay

## 2015-08-19 NOTE — Telephone Encounter (Signed)
New message    [facility] calling     Request for surgical clearance:  1. What type of surgery is being performed? Left CEA   2. When is this surgery scheduled? 3.31.2017   3. Are there any medications that need to be held prior to surgery and how long? eliquis 72 hrs prior   4. Name of physician performing surgery? Dr. Trula Slade  5. What is your office phone and fax number? Fax # [phone]

## 2015-08-19 NOTE — Telephone Encounter (Signed)
Bilateral carotid artery disease (HCC) Emily [name] was referred to me by Dr. Percival Spanish for evaluation of high-grade left internal carotid artery stenosis. She also has moderate right ICA stenosis. The Doppler was ordered because of an auscultated bruit. She is neurologically asymptomatic. She has no history of prior stroke nor does she have history of heart disease. She does have proximal atrial fib maintaining sinus rhythm on oral and a coagulation. She is not high risk for endarterectomy. I am going to get a 2-D echo and a pharmacologic Myoview stress test to risk stratify her. If these come back normal she would be an excellent candidate for endarterectomy and I will refer her to vascular surgery ( Dr. Trula Slade).   [name] MD FACP,FACC,FAHA, [facility] 07/05/2015  The echo demonstrated a NL LV function/mild aortic stenosis and Myoview was normal with a hypertensive response to exercise.  Will forward to Dr Percival Spanish for review and surgical clearance.  She is also on Eliquis for At Fib.

## 2015-08-20 NOTE — Telephone Encounter (Signed)
The patient would be at acceptable risk for the planned procedure.  No further preop work up indicated.

## 2015-08-21 NOTE — Telephone Encounter (Signed)
Faxed to number given.

## 2015-08-26 NOTE — Pre-Procedure Instructions (Signed)
Emily [name]  08/26/2015      [id number] - [place], [place] [address] Phone: [phone] Fax: [phone]  [facility] [place] [place] [address] Phone: [phone] Fax: [phone]  [id number] [place], [address] [place] [address] Phone: [phone] Fax: [phone]    Your procedure is scheduled on Fri, Mar 31 @ 10:35 AM  Report to [facility] Admitting at 8:30 AM  Call this number if you have problems the morning of surgery:  [phone]   Remember:  Do not eat food or drink liquids after midnight.  Take these medicines the morning of surgery with A SIP OF WATER:Diltiazem(Cardizem),Flecainide(Tambocor),Synthroid(Levothyroxine),and Metoprolol(Toprol)              Hold Eliquis 72 hours prior to surgery. No Goody's,BC's,Aleve,Advil,Motrin,Fish Oil,or any Herbal Medications.      How to Manage Your Diabetes Before and After Surgery  Why is it important to control my blood sugar before and after surgery? . Improving blood sugar levels before and after surgery helps healing and can limit problems. . A way of improving blood sugar control is eating a healthy diet by: o  Eating less sugar and carbohydrates o  Increasing activity/exercise o  Talking with your doctor about reaching your blood sugar goals . High blood sugars (greater than 180 mg/dL) can raise your risk of infections and slow your recovery, so you will need to focus on controlling your diabetes during the weeks before surgery. . Make sure that the doctor who takes care of your diabetes knows about your planned surgery including the date and location.  How do I manage my blood sugar before surgery? . Check your blood sugar at least 4 times a day, starting 2 days before surgery, to  make sure that the level is not too high or low. o Check your blood sugar the morning of your surgery when you wake up and every 2 hours until you get to the Short Stay unit. . If your blood sugar is less than 70 mg/dL, you will need to treat for low blood sugar: o Do not take insulin. o Treat a low blood sugar (less than 70 mg/dL) with  cup of clear juice (cranberry or apple), 4 glucose tablets, OR glucose gel. o Recheck blood sugar in 15 minutes after treatment (to make sure it is greater than 70 mg/dL). If your blood sugar is not greater than 70 mg/dL on recheck, call [phone] for further instructions. . Report your blood sugar to the short stay nurse when you get to Short Stay.  . If you are admitted to the hospital after surgery: o Your blood sugar will be checked by the staff and you will probably be given insulin after surgery (instead of oral diabetes medicines) to make sure you have good blood sugar levels. o The goal for blood sugar control after surgery is 80-180 mg/dL.              WHAT DO I DO ABOUT MY DIABETES MEDICATION?   Marland Kitchen Do not take oral diabetes medicines (pills) the morning of surgery.  .       .   . The day of surgery, do not take other diabetes injectables, including Byetta (exenatide), Bydureon (exenatide ER), Victoza (liraglutide), or Trulicity (dulaglutide).  Marland Kitchen  If your CBG is greater than 220 mg/dL, you may take  of your sliding scale (correction) dose of insulin.  Other Instructions:          Patient Signature:  Date:   Nurse Signature:  Date:   Reviewed and Endorsed by [facility] Patient Education Committee, August 2015  Do not wear jewelry, make-up or nail polish.  Do not wear lotions, powders, or perfumes.  You may wear deodorant.  Do not shave 48 hours prior to surgery.    Do not bring valuables to the hospital.  [facility] is not responsible for any belongings or valuables.  Contacts, dentures or bridgework may not be worn  into surgery.  Leave your suitcase in the car.  After surgery it may be brought to your room.  For patients admitted to the hospital, discharge time will be determined by your treatment team.  Patients discharged the day of surgery will not be allowed to drive home.   [facility]  Before surgery, you can play an important role.  Because skin is not sterile, your skin needs to be as free of germs as possible.  You can reduce the number of germs on you skin by washing with CHG (chlorahexidine gluconate) soap before surgery.  CHG is an antiseptic cleaner which kills germs and bonds with the skin to continue killing germs even after washing.  Please DO NOT use if you have an allergy to CHG or antibacterial soaps.  If your skin becomes reddened/irritated stop using the CHG and inform your nurse when you arrive at Short Stay.  Do not shave (including legs and underarms) for at least 48 hours prior to the first CHG shower.  You may shave your face.  Please follow these instructions carefully:   1.  Shower with CHG Soap the night before surgery and the                                morning of Surgery.  2.  If you choose to wash your hair, wash your hair first as usual with your       normal shampoo.  3.  After you shampoo, rinse your hair and body thoroughly to remove the                      Shampoo.  4.  Use CHG as you would any other liquid soap.  You can apply chg directly       to the skin and wash gently with scrungie or a clean washcloth.  5.  Apply the CHG Soap to your body ONLY FROM THE NECK DOWN.        Do not use on open wounds or open sores.  Avoid contact with your eyes,       ears, mouth and genitals (private parts).  Wash genitals (private parts)       with your normal soap.  6.  Wash thoroughly, paying special attention to the area where your surgery        will be performed.  7.  Thoroughly rinse your body with warm water from the neck  down.  8.  DO NOT shower/wash with your normal soap after using and rinsing off       the CHG Soap.  9.  Pat yourself dry with a clean towel.  10.  Wear clean pajamas.            11.  Place clean sheets on your bed the night of your first shower and do not        sleep with pets.  Day of Surgery  Do not apply any lotions/deoderants the morning of surgery.  Please wear clean clothes to the hospital/surgery center.    Please read over the following fact sheets that you were given. Pain Booklet, Coughing and Deep Breathing, Blood Transfusion Information, MRSA Information and Surgical Site Infection Prevention

## 2015-08-27 ENCOUNTER — Encounter (HOSPITAL_COMMUNITY)
Admission: RE | Admit: 2015-08-27 | Discharge: 2015-08-27 | Disposition: A | Discharge status: Home / Self Care | Payer: Medicare Other | Source: Ambulatory Visit | Attending: Surgery | Admitting: Surgery

## 2015-08-27 ENCOUNTER — Encounter (HOSPITAL_COMMUNITY): Payer: Self-pay

## 2015-08-27 HISTORY — DX: Cardiac murmur, unspecified: R01.1

## 2015-08-27 HISTORY — DX: Failed or difficult intubation, initial encounter: T88.4XXA

## 2015-08-27 HISTORY — DX: Anemia, unspecified: D64.9

## 2015-08-27 HISTORY — DX: Cardiac arrhythmia, unspecified: I49.9

## 2015-08-27 LAB — COMPREHENSIVE METABOLIC PANEL
ALBUMIN: 3.8 g/dL (ref 3.5–5.0)
ALT: 27 U/L (ref 14–54)
AST: 35 U/L (ref 15–41)
Alkaline Phosphatase: 104 U/L (ref 38–126)
Anion gap: 10 (ref 5–15)
BILIRUBIN TOTAL: 0.5 mg/dL (ref 0.3–1.2)
BUN: 16 mg/dL (ref 6–20)
CHLORIDE: 103 mmol/L (ref 101–111)
CO2: 24 mmol/L (ref 22–32)
CREATININE: 0.53 mg/dL (ref 0.44–1.00)
Calcium: 9 mg/dL (ref 8.9–10.3)
GFR calc Af Amer: >60 mL/min (ref >60)
GLUCOSE: 260 mg/dL — AB (ref 65–99)
Potassium: 3.8 mmol/L (ref 3.5–5.1)
Sodium: 137 mmol/L (ref 135–145)
TOTAL PROTEIN: 7.1 g/dL (ref 6.5–8.1)

## 2015-08-27 LAB — CBC
HEMATOCRIT: 33.7 % — AB (ref 36.0–46.0)
Hemoglobin: 10.6 g/dL — ABNORMAL LOW (ref 12.0–15.0)
MCH: 27.2 pg (ref 26.0–34.0)
MCHC: 31.5 g/dL (ref 30.0–36.0)
MCV: 86.6 fL (ref 78.0–100.0)
Platelets: 226 10*3/uL (ref 150–400)
RBC: 3.89 MIL/uL (ref 3.87–5.11)
RDW: 15.2 % (ref 11.5–15.5)
WBC: 8 10*3/uL (ref 4.0–10.5)

## 2015-08-27 LAB — SURGICAL PCR SCREEN
MRSA, PCR: NEGATIVE
Staphylococcus aureus: NEGATIVE

## 2015-08-27 LAB — GLUCOSE, CAPILLARY: Glucose-Capillary: 250 mg/dL — ABNORMAL HIGH (ref 65–99)

## 2015-08-27 LAB — URINALYSIS, ROUTINE W REFLEX MICROSCOPIC
Bilirubin Urine: NEGATIVE
GLUCOSE, UA: 100 mg/dL — AB
HGB URINE DIPSTICK: NEGATIVE
Ketones, ur: NEGATIVE mg/dL
LEUKOCYTES UA: NEGATIVE
NITRITE: NEGATIVE
PROTEIN: NEGATIVE mg/dL
SPECIFIC GRAVITY, URINE: 1.011 (ref 1.005–1.030)
pH: 6 (ref 5.0–8.0)

## 2015-08-27 LAB — TYPE AND SCREEN
ABO/RH(D): A NEG
Antibody Screen: NEGATIVE

## 2015-08-27 LAB — ABO/RH: ABO/RH(D): A NEG

## 2015-08-27 LAB — PROTIME-INR
INR: 1.1 (ref 0.00–1.49)
PROTHROMBIN TIME: 14.4 s (ref 11.6–15.2)

## 2015-08-27 LAB — APTT: APTT: 30 s (ref 24–37)

## 2015-08-27 NOTE — Progress Notes (Signed)
Anesthesia Chart Review:  Pt is a 65 year old female scheduled for L CEA on 08/30/2015 with Dr. Trula Slade.   Cardiologist is Dr. Minus Breeding who has cleared her for surgery.   PMH includes:  PAF, HTN DM, heart murmur, anemia, carotid artery disease, OSA, hypothyroid. Never smoker. BMI 45.   Pt has a potentially difficult airway. She reports the "'lady that did the sleep study told me I have the smallest airway she has ever seen in an adult". Pt able to open mouth adequately, appears to be mallampati 2-3.   Pt complains of upper back (between shoulder blades) ache that occurs when pt has been active. Has been occuring for over 10 years, she has mentioned symptoms to her doctors. Used to occur when she would mow the lawn with a push mower (it would occur at beginning of mowing and ease partway through) but she no longer does this activity. Now it only seems to occur after she has been walking. She denies upper back ache while walking, but will feel it when she stops walking and is at rest. She also felt same sx once when she was first dx with afib. Denies SOB. Reports has had 2 normal stress tests in last 5 years; during first one she experienced upper back ache, during 2nd one (07/2015, see below) she did not experience sx.   Medications include: eliquis, diltiazem, flecainide prn, lasix, glipizide, hctz, levothyroxine, losartan, lovastatin, metformin, metoprolol.   Preoperative labs reviewed. Glucose 260. HgbA1c was 7.7 on 07/02/15.   EKG 06/12/15: NSR. ST and T wave abnormality, consider inferior ischemia.   Echo 07/17/15:  - Left ventricle: The cavity size was normal. Wall thickness was increased in a pattern of moderate LVH. Systolic function was normal. The estimated ejection fraction was in the range of 60% to 65%. Wall motion was normal; there were no regional wall motion abnormalities. Doppler parameters are consistent with abnormal left ventricular relaxation (grade 1 diastolic  dysfunction). - Aortic valve: Valve area (Vmax): 1.15 cm^2.  Nuclear stress test 07/16/15: Normal pharmacologic nuclear study with no evidence for infarct or ischemia. EF 66%. There was baseline hypertension with severe hypertension at stress.  Pt with normal stress test last month. Upper back ache sx have occurred for years and seem atypical for a CV problem. If no changes, I anticipate pt can proceed with surgery as scheduled.   Willeen Cass, FNP-BC [facility]/Anesthesiology Phone: [phone] 08/27/2015 2:58 PM

## 2015-08-30 ENCOUNTER — Inpatient Hospital Stay (HOSPITAL_COMMUNITY): Payer: Medicare Other | Admitting: Certified Registered Nurse Anesthetist

## 2015-08-30 ENCOUNTER — Encounter (HOSPITAL_COMMUNITY): Payer: Self-pay | Admitting: *Deleted

## 2015-08-30 ENCOUNTER — Inpatient Hospital Stay (HOSPITAL_COMMUNITY)
Admission: RE | Admit: 2015-08-30 | Discharge: 2015-08-31 | DRG: 038 | Disposition: A | Discharge status: Home / Self Care | Payer: Medicare Other | Source: Ambulatory Visit | Attending: Surgery | Admitting: Surgery

## 2015-08-30 ENCOUNTER — Inpatient Hospital Stay (HOSPITAL_COMMUNITY): Payer: Medicare Other | Admitting: Vascular Surgery

## 2015-08-30 ENCOUNTER — Encounter (HOSPITAL_COMMUNITY)
Admission: RE | Disposition: A | Discharge status: Home / Self Care | Payer: Self-pay | Source: Ambulatory Visit | Attending: Surgery

## 2015-08-30 DIAGNOSIS — Z7984 Long term (current) use of oral hypoglycemic drugs: Secondary | ICD-10-CM | POA: Diagnosis not present

## 2015-08-30 DIAGNOSIS — G4733 Obstructive sleep apnea (adult) (pediatric): Secondary | ICD-10-CM | POA: Diagnosis present

## 2015-08-30 DIAGNOSIS — E669 Obesity, unspecified: Secondary | ICD-10-CM | POA: Diagnosis present

## 2015-08-30 DIAGNOSIS — Z7901 Long term (current) use of anticoagulants: Secondary | ICD-10-CM | POA: Diagnosis not present

## 2015-08-30 DIAGNOSIS — E7800 Pure hypercholesterolemia, unspecified: Secondary | ICD-10-CM | POA: Diagnosis present

## 2015-08-30 DIAGNOSIS — Z79899 Other long term (current) drug therapy: Secondary | ICD-10-CM | POA: Diagnosis not present

## 2015-08-30 DIAGNOSIS — E039 Hypothyroidism, unspecified: Secondary | ICD-10-CM | POA: Diagnosis present

## 2015-08-30 DIAGNOSIS — I480 Paroxysmal atrial fibrillation: Secondary | ICD-10-CM | POA: Diagnosis present

## 2015-08-30 DIAGNOSIS — E119 Type 2 diabetes mellitus without complications: Secondary | ICD-10-CM | POA: Diagnosis present

## 2015-08-30 DIAGNOSIS — D649 Anemia, unspecified: Secondary | ICD-10-CM | POA: Diagnosis not present

## 2015-08-30 DIAGNOSIS — I6523 Occlusion and stenosis of bilateral carotid arteries: Principal | ICD-10-CM | POA: Diagnosis present

## 2015-08-30 DIAGNOSIS — I6529 Occlusion and stenosis of unspecified carotid artery: Secondary | ICD-10-CM | POA: Diagnosis present

## 2015-08-30 DIAGNOSIS — I10 Essential (primary) hypertension: Secondary | ICD-10-CM | POA: Diagnosis present

## 2015-08-30 DIAGNOSIS — Z6841 Body Mass Index (BMI) 40.0 and over, adult: Secondary | ICD-10-CM

## 2015-08-30 DIAGNOSIS — I6522 Occlusion and stenosis of left carotid artery: Secondary | ICD-10-CM

## 2015-08-30 HISTORY — PX: ENDARTERECTOMY: SHX5162

## 2015-08-30 LAB — GLUCOSE, CAPILLARY
GLUCOSE-CAPILLARY: 230 mg/dL — AB (ref 65–99)
Glucose-Capillary: 207 mg/dL — ABNORMAL HIGH (ref 65–99)

## 2015-08-30 LAB — CBC
HEMATOCRIT: 32.5 % — AB (ref 36.0–46.0)
HEMOGLOBIN: 10.1 g/dL — AB (ref 12.0–15.0)
MCH: 26.6 pg (ref 26.0–34.0)
MCHC: 31.1 g/dL (ref 30.0–36.0)
MCV: 85.8 fL (ref 78.0–100.0)
PLATELETS: 213 10*3/uL (ref 150–400)
RBC: 3.79 MIL/uL — AB (ref 3.87–5.11)
RDW: 15.3 % (ref 11.5–15.5)
WBC: 11.1 10*3/uL — AB (ref 4.0–10.5)

## 2015-08-30 LAB — MRSA PCR SCREENING: MRSA by PCR: NEGATIVE

## 2015-08-30 LAB — CREATININE, SERUM
Creatinine, Ser: 0.47 mg/dL (ref 0.44–1.00)
GFR calc non Af Amer: >60 mL/min (ref >60)

## 2015-08-30 SURGERY — ENDARTERECTOMY, CAROTID
Anesthesia: General | Site: Neck | Laterality: Left

## 2015-08-30 MED ORDER — FENTANYL CITRATE (PF) 100 MCG/2ML IJ SOLN
INTRAMUSCULAR | Status: AC
  Administered 2015-08-30: 25 ug via INTRAVENOUS
  Filled 2015-08-30: qty 2

## 2015-08-30 MED ORDER — PHENOL 1.4 % MT LIQD: 1.0000 | OROMUCOSAL | Status: DC | PRN

## 2015-08-30 MED ORDER — METOPROLOL TARTRATE 1 MG/ML IV SOLN: 2.0000 mg | INTRAVENOUS | Status: DC | PRN

## 2015-08-30 MED ORDER — LIDOCAINE HCL (PF) 1 % IJ SOLN
INTRAMUSCULAR | Status: AC
  Filled 2015-08-30: qty 30

## 2015-08-30 MED ORDER — HEMOSTATIC AGENTS (NO CHARGE) OPTIME
TOPICAL | Status: DC | PRN
  Administered 2015-08-30: 1 via TOPICAL

## 2015-08-30 MED ORDER — INSULIN ASPART 100 UNIT/ML ~~LOC~~ SOLN
0.0000 [IU] | Freq: Every day | SUBCUTANEOUS | Status: DC
  Administered 2015-08-30: 2 [IU] via SUBCUTANEOUS

## 2015-08-30 MED ORDER — MORPHINE SULFATE (PF) 2 MG/ML IV SOLN: 2.0000 mg | INTRAVENOUS | Status: DC | PRN

## 2015-08-30 MED ORDER — PRAVASTATIN SODIUM 20 MG PO TABS: 20.0000 mg | ORAL_TABLET | Freq: Every day | ORAL | Status: DC

## 2015-08-30 MED ORDER — CHLORHEXIDINE GLUCONATE 4 % EX LIQD: 60.0000 mL | Freq: Once | CUTANEOUS | Status: DC

## 2015-08-30 MED ORDER — POTASSIUM CHLORIDE CRYS ER 20 MEQ PO TBCR: 20.0000 meq | EXTENDED_RELEASE_TABLET | Freq: Every day | ORAL | Status: DC | PRN

## 2015-08-30 MED ORDER — APIXABAN 5 MG PO TABS
5.0000 mg | ORAL_TABLET | Freq: Two times a day (BID) | ORAL | Status: DC
  Administered 2015-08-31: 5 mg via ORAL
  Filled 2015-08-30: qty 1

## 2015-08-30 MED ORDER — SODIUM CHLORIDE 0.9 % IV SOLN: INTRAVENOUS | Status: DC

## 2015-08-30 MED ORDER — PROTAMINE SULFATE 10 MG/ML IV SOLN
INTRAVENOUS | Status: AC
  Filled 2015-08-30: qty 5

## 2015-08-30 MED ORDER — ACETAMINOPHEN 325 MG PO TABS: 325.0000 mg | ORAL_TABLET | ORAL | Status: DC | PRN

## 2015-08-30 MED ORDER — INSULIN ASPART 100 UNIT/ML ~~LOC~~ SOLN: 0.0000 [IU] | Freq: Three times a day (TID) | SUBCUTANEOUS | Status: DC

## 2015-08-30 MED ORDER — MAGNESIUM SULFATE 2 GM/50ML IV SOLN: 2.0000 g | Freq: Every day | INTRAVENOUS | Status: DC | PRN

## 2015-08-30 MED ORDER — SUCCINYLCHOLINE 20MG/ML (10ML) SYRINGE FOR MEDFUSION PUMP - OPTIME: INTRAMUSCULAR | Status: DC | PRN

## 2015-08-30 MED ORDER — 0.9 % SODIUM CHLORIDE (POUR BTL) OPTIME
TOPICAL | Status: DC | PRN
  Administered 2015-08-30: 1000 mL

## 2015-08-30 MED ORDER — SODIUM CHLORIDE 0.9 % IV SOLN: 500.0000 mL | Freq: Once | INTRAVENOUS | Status: DC | PRN

## 2015-08-30 MED ORDER — SUCCINYLCHOLINE 20MG/ML (10ML) SYRINGE FOR MEDFUSION PUMP - OPTIME
INTRAMUSCULAR | Status: DC | PRN
  Administered 2015-08-30: 160 mg via INTRAVENOUS

## 2015-08-30 MED ORDER — BISACODYL 10 MG RE SUPP: 10.0000 mg | Freq: Every day | RECTAL | Status: DC | PRN

## 2015-08-30 MED ORDER — LOSARTAN POTASSIUM 50 MG PO TABS
50.0000 mg | ORAL_TABLET | Freq: Every day | ORAL | Status: DC
  Administered 2015-08-30 – 2015-08-31 (×2): 50 mg via ORAL
  Filled 2015-08-30 (×2): qty 1

## 2015-08-30 MED ORDER — OXYCODONE-ACETAMINOPHEN 5-325 MG PO TABS: 1.0000 | ORAL_TABLET | ORAL | Status: DC | PRN

## 2015-08-30 MED ORDER — HYDRALAZINE HCL 20 MG/ML IJ SOLN: 5.0000 mg | INTRAMUSCULAR | Status: DC | PRN

## 2015-08-30 MED ORDER — SODIUM CHLORIDE 0.9 % IV SOLN
INTRAVENOUS | Status: DC
  Administered 2015-08-30: 125 mL/h via INTRAVENOUS

## 2015-08-30 MED ORDER — SUGAMMADEX SODIUM 500 MG/5ML IV SOLN
INTRAVENOUS | Status: AC
  Filled 2015-08-30: qty 5

## 2015-08-30 MED ORDER — GLIPIZIDE 5 MG PO TABS
5.0000 mg | ORAL_TABLET | Freq: Two times a day (BID) | ORAL | Status: DC
Start: 2015-08-31 — End: 2015-08-31
  Administered 2015-08-31: 5 mg via ORAL
  Filled 2015-08-30: qty 1

## 2015-08-30 MED ORDER — LACTATED RINGERS IV SOLN
INTRAVENOUS | Status: DC
  Administered 2015-08-30 (×3): via INTRAVENOUS

## 2015-08-30 MED ORDER — PHENYLEPHRINE HCL 10 MG/ML IJ SOLN
INTRAMUSCULAR | Status: DC | PRN
  Administered 2015-08-30: 40 ug via INTRAVENOUS
  Administered 2015-08-30 (×3): 80 ug via INTRAVENOUS

## 2015-08-30 MED ORDER — DEXTROSE 5 % IV SOLN
10.0000 mg | INTRAVENOUS | Status: DC | PRN
  Administered 2015-08-30: 25 ug/min via INTRAVENOUS

## 2015-08-30 MED ORDER — LEVOTHYROXINE SODIUM 112 MCG PO TABS
168.0000 ug | ORAL_TABLET | ORAL | Status: DC
  Administered 2015-08-31: 168 ug via ORAL
  Filled 2015-08-30: qty 2

## 2015-08-30 MED ORDER — OXYCODONE HCL 5 MG PO TABS
5.0000 mg | ORAL_TABLET | ORAL | Status: DC | PRN
  Administered 2015-08-31: 5 mg via ORAL
  Filled 2015-08-30: qty 1

## 2015-08-30 MED ORDER — DOCUSATE SODIUM 100 MG PO CAPS
100.0000 mg | ORAL_CAPSULE | Freq: Every day | ORAL | Status: DC
  Administered 2015-08-31: 100 mg via ORAL
  Filled 2015-08-30: qty 1

## 2015-08-30 MED ORDER — VECURONIUM BROMIDE 10 MG IV SOLR
INTRAVENOUS | Status: DC | PRN
  Administered 2015-08-30: 2 mg via INTRAVENOUS
  Administered 2015-08-30: 6 mg via INTRAVENOUS
  Administered 2015-08-30: 2 mg via INTRAVENOUS

## 2015-08-30 MED ORDER — LABETALOL HCL 5 MG/ML IV SOLN: 10.0000 mg | INTRAVENOUS | Status: DC | PRN

## 2015-08-30 MED ORDER — IBUPROFEN 200 MG PO TABS: 600.0000 mg | ORAL_TABLET | Freq: Every day | ORAL | Status: DC | PRN

## 2015-08-30 MED ORDER — DEXTROSE 5 % IV SOLN
1.5000 g | INTRAVENOUS | Status: AC
  Administered 2015-08-30: 1.5 g via INTRAVENOUS

## 2015-08-30 MED ORDER — NITROGLYCERIN 0.2 MG/ML ON CALL CATH LAB
INTRAVENOUS | Status: DC | PRN
  Administered 2015-08-30 (×2): 20 ug via INTRAVENOUS

## 2015-08-30 MED ORDER — SENNOSIDES-DOCUSATE SODIUM 8.6-50 MG PO TABS: 1.0000 | ORAL_TABLET | Freq: Every evening | ORAL | Status: DC | PRN

## 2015-08-30 MED ORDER — CEFUROXIME SODIUM 1.5 G IJ SOLR
INTRAMUSCULAR | Status: AC
  Filled 2015-08-30: qty 1.5

## 2015-08-30 MED ORDER — MIDAZOLAM HCL 5 MG/5ML IJ SOLN
INTRAMUSCULAR | Status: DC | PRN
  Administered 2015-08-30: 1 mg via INTRAVENOUS

## 2015-08-30 MED ORDER — REMIFENTANIL HCL 1 MG IV SOLR: INTRAVENOUS | Status: DC | PRN

## 2015-08-30 MED ORDER — METOPROLOL SUCCINATE ER 50 MG PO TB24
100.0000 mg | ORAL_TABLET | Freq: Every day | ORAL | Status: DC
  Administered 2015-08-31: 100 mg via ORAL
  Filled 2015-08-30: qty 2

## 2015-08-30 MED ORDER — OXYCODONE HCL 5 MG/5ML PO SOLN: 5.0000 mg | Freq: Once | ORAL | Status: DC | PRN

## 2015-08-30 MED ORDER — DILTIAZEM HCL ER COATED BEADS 180 MG PO CP24
180.0000 mg | ORAL_CAPSULE | Freq: Every day | ORAL | Status: DC
  Administered 2015-08-31: 180 mg via ORAL
  Filled 2015-08-30: qty 1

## 2015-08-30 MED ORDER — LEVOTHYROXINE SODIUM 112 MCG PO TABS: 56.0000 ug | ORAL_TABLET | ORAL | Status: DC

## 2015-08-30 MED ORDER — PANTOPRAZOLE SODIUM 40 MG PO TBEC
40.0000 mg | DELAYED_RELEASE_TABLET | Freq: Every day | ORAL | Status: DC
  Administered 2015-08-30 – 2015-08-31 (×2): 40 mg via ORAL
  Filled 2015-08-30 (×2): qty 1

## 2015-08-30 MED ORDER — FENTANYL CITRATE (PF) 100 MCG/2ML IJ SOLN
INTRAMUSCULAR | Status: DC | PRN
  Administered 2015-08-30 (×2): 50 ug via INTRAVENOUS

## 2015-08-30 MED ORDER — HYDROCHLOROTHIAZIDE 25 MG PO TABS
25.0000 mg | ORAL_TABLET | Freq: Every day | ORAL | Status: DC
  Administered 2015-08-31: 25 mg via ORAL
  Filled 2015-08-30: qty 1

## 2015-08-30 MED ORDER — SUGAMMADEX SODIUM 500 MG/5ML IV SOLN
INTRAVENOUS | Status: DC | PRN
  Administered 2015-08-30: 250 mg via INTRAVENOUS

## 2015-08-30 MED ORDER — ONDANSETRON HCL 4 MG/2ML IJ SOLN: 4.0000 mg | Freq: Four times a day (QID) | INTRAMUSCULAR | Status: DC | PRN

## 2015-08-30 MED ORDER — SODIUM CHLORIDE 0.9 % IV SOLN
0.0125 ug/kg/min | INTRAVENOUS | Status: AC
  Administered 2015-08-30: .2 ug/kg/min via INTRAVENOUS
  Filled 2015-08-30: qty 2000

## 2015-08-30 MED ORDER — ACETAMINOPHEN 650 MG RE SUPP: 325.0000 mg | RECTAL | Status: DC | PRN

## 2015-08-30 MED ORDER — GUAIFENESIN-DM 100-10 MG/5ML PO SYRP: 15.0000 mL | ORAL_SOLUTION | ORAL | Status: DC | PRN

## 2015-08-30 MED ORDER — PROPOFOL 10 MG/ML IV BOLUS
INTRAVENOUS | Status: AC
  Filled 2015-08-30: qty 20

## 2015-08-30 MED ORDER — LEVOTHYROXINE SODIUM 112 MCG PO TABS: 112.0000 ug | ORAL_TABLET | ORAL | Status: DC

## 2015-08-30 MED ORDER — ALUM & MAG HYDROXIDE-SIMETH 200-200-20 MG/5ML PO SUSP: 15.0000 mL | ORAL | Status: DC | PRN

## 2015-08-30 MED ORDER — INSULIN ASPART 100 UNIT/ML ~~LOC~~ SOLN
0.0000 [IU] | Freq: Three times a day (TID) | SUBCUTANEOUS | Status: DC
  Administered 2015-08-31: 11 [IU] via SUBCUTANEOUS

## 2015-08-30 MED ORDER — ONDANSETRON HCL 4 MG/2ML IJ SOLN
INTRAMUSCULAR | Status: DC | PRN
  Administered 2015-08-30: 4 mg via INTRAVENOUS

## 2015-08-30 MED ORDER — ASPIRIN EC 325 MG PO TBEC
325.0000 mg | DELAYED_RELEASE_TABLET | Freq: Every day | ORAL | Status: DC
  Filled 2015-08-30: qty 1

## 2015-08-30 MED ORDER — ONDANSETRON HCL 4 MG/2ML IJ SOLN: 4.0000 mg | Freq: Once | INTRAMUSCULAR | Status: DC | PRN

## 2015-08-30 MED ORDER — FUROSEMIDE 20 MG PO TABS: 20.0000 mg | ORAL_TABLET | Freq: Every day | ORAL | Status: DC | PRN

## 2015-08-30 MED ORDER — EPHEDRINE SULFATE 50 MG/ML IJ SOLN
INTRAMUSCULAR | Status: DC | PRN
  Administered 2015-08-30: 5 mg via INTRAVENOUS
  Administered 2015-08-30 (×2): 10 mg via INTRAVENOUS
  Administered 2015-08-30 (×2): 5 mg via INTRAVENOUS

## 2015-08-30 MED ORDER — METFORMIN HCL 500 MG PO TABS
500.0000 mg | ORAL_TABLET | Freq: Two times a day (BID) | ORAL | Status: DC
  Administered 2015-08-31: 500 mg via ORAL
  Filled 2015-08-30: qty 1

## 2015-08-30 MED ORDER — LIDOCAINE HCL (CARDIAC) 20 MG/ML IV SOLN
INTRAVENOUS | Status: DC | PRN
  Administered 2015-08-30: 40 mg via INTRAVENOUS

## 2015-08-30 MED ORDER — FENTANYL CITRATE (PF) 250 MCG/5ML IJ SOLN
INTRAMUSCULAR | Status: AC
  Filled 2015-08-30: qty 5

## 2015-08-30 MED ORDER — PROPOFOL 10 MG/ML IV BOLUS
INTRAVENOUS | Status: DC | PRN
  Administered 2015-08-30: 180 mg via INTRAVENOUS

## 2015-08-30 MED ORDER — MIDAZOLAM HCL 2 MG/2ML IJ SOLN
INTRAMUSCULAR | Status: AC
  Filled 2015-08-30: qty 2

## 2015-08-30 MED ORDER — SODIUM CHLORIDE 0.9 % IV SOLN
INTRAVENOUS | Status: DC | PRN
  Administered 2015-08-30: 500 mL

## 2015-08-30 MED ORDER — FENTANYL CITRATE (PF) 100 MCG/2ML IJ SOLN
25.0000 ug | INTRAMUSCULAR | Status: DC | PRN
  Administered 2015-08-30 (×3): 25 ug via INTRAVENOUS

## 2015-08-30 MED ORDER — OXYCODONE HCL 5 MG PO TABS: 5.0000 mg | ORAL_TABLET | Freq: Once | ORAL | Status: DC | PRN

## 2015-08-30 MED ORDER — HEPARIN SODIUM (PORCINE) 1000 UNIT/ML IJ SOLN
INTRAMUSCULAR | Status: DC | PRN
  Administered 2015-08-30: 10 mL via INTRAVENOUS

## 2015-08-30 MED ORDER — FLECAINIDE ACETATE 100 MG PO TABS
300.0000 mg | ORAL_TABLET | Freq: Every day | ORAL | Status: DC | PRN
  Filled 2015-08-30: qty 3

## 2015-08-30 MED ORDER — PROTAMINE SULFATE 10 MG/ML IV SOLN
INTRAVENOUS | Status: DC | PRN
  Administered 2015-08-30: 50 mg via INTRAVENOUS

## 2015-08-30 SURGICAL SUPPLY — 49 items
CANISTER SUCTION 2500CC (MISCELLANEOUS) ×3 IMPLANT
CATH ROBINSON RED A/P 18FR (CATHETERS) ×3 IMPLANT
CATH SUCT 10FR WHISTLE TIP (CATHETERS) ×3 IMPLANT
CLIP TI MEDIUM 6 (CLIP) ×3 IMPLANT
CLIP TI WIDE RED SMALL 6 (CLIP) ×3 IMPLANT
CRADLE DONUT ADULT HEAD (MISCELLANEOUS) ×3 IMPLANT
DRAIN CHANNEL 15F RND FF W/TCR (WOUND CARE) IMPLANT
DRAPE INCISE IOBAN 66X45 STRL (DRAPES) ×3 IMPLANT
ELECT REM PT RETURN 9FT ADLT (ELECTROSURGICAL) ×3
ELECTRODE REM PT RTRN 9FT ADLT (ELECTROSURGICAL) ×1 IMPLANT
EVACUATOR SILICONE 100CC (DRAIN) IMPLANT
GAUZE SPONGE 4X4 12PLY STRL (GAUZE/BANDAGES/DRESSINGS) ×3 IMPLANT
GLOVE BIOGEL PI IND STRL 6.5 (GLOVE) ×1 IMPLANT
GLOVE BIOGEL PI IND STRL 7.5 (GLOVE) ×2 IMPLANT
GLOVE BIOGEL PI INDICATOR 6.5 (GLOVE) ×2
GLOVE BIOGEL PI INDICATOR 7.5 (GLOVE) ×4
GLOVE ECLIPSE 7.0 STRL STRAW (GLOVE) ×6 IMPLANT
GLOVE SS BIOGEL STRL SZ 7.5 (GLOVE) ×1 IMPLANT
GLOVE SUPERSENSE BIOGEL SZ 7.5 (GLOVE) ×2
GLOVE SURG SS PI 7.5 STRL IVOR (GLOVE) ×6 IMPLANT
GOWN STRL REUS W/ TWL LRG LVL3 (GOWN DISPOSABLE) ×1 IMPLANT
GOWN STRL REUS W/ TWL XL LVL3 (GOWN DISPOSABLE) ×2 IMPLANT
GOWN STRL REUS W/TWL LRG LVL3 (GOWN DISPOSABLE) ×2
GOWN STRL REUS W/TWL XL LVL3 (GOWN DISPOSABLE) ×4
HEMOSTAT SNOW SURGICEL 2X4 (HEMOSTASIS) ×3 IMPLANT
INSERT FOGARTY SM (MISCELLANEOUS) IMPLANT
KIT BASIN OR (CUSTOM PROCEDURE TRAY) ×3 IMPLANT
KIT ROOM TURNOVER OR (KITS) ×3 IMPLANT
LIQUID BAND (GAUZE/BANDAGES/DRESSINGS) ×3 IMPLANT
NEEDLE HYPO 25GX1X1/2 BEV (NEEDLE) IMPLANT
NS IRRIG 1000ML POUR BTL (IV SOLUTION) ×9 IMPLANT
PACK CAROTID (CUSTOM PROCEDURE TRAY) ×3 IMPLANT
PAD ARMBOARD 7.5X6 YLW CONV (MISCELLANEOUS) ×6 IMPLANT
PATCH VASC XENOSURE 1CMX6CM (Vascular Products) ×2 IMPLANT
PATCH VASC XENOSURE 1X6 (Vascular Products) ×1 IMPLANT
SHUNT CAROTID BYPASS 10 (VASCULAR PRODUCTS) IMPLANT
SHUNT CAROTID BYPASS 12FRX15.5 (VASCULAR PRODUCTS) IMPLANT
SPONGE INTESTINAL PEANUT (DISPOSABLE) IMPLANT
SUT ETHILON 3 0 PS 1 (SUTURE) IMPLANT
SUT PROLENE 6 0 BV (SUTURE) ×6 IMPLANT
SUT PROLENE 7 0 BV 1 (SUTURE) IMPLANT
SUT PROLENE 7 0 BV1 MDA (SUTURE) ×3 IMPLANT
SUT SILK 3 0 TIES 17X18 (SUTURE)
SUT SILK 3-0 18XBRD TIE BLK (SUTURE) IMPLANT
SUT VIC AB 3-0 SH 27 (SUTURE) ×4
SUT VIC AB 3-0 SH 27X BRD (SUTURE) ×2 IMPLANT
SUT VICRYL 4-0 PS2 18IN ABS (SUTURE) ×3 IMPLANT
SYR CONTROL 10ML LL (SYRINGE) IMPLANT
WATER STERILE IRR 1000ML POUR (IV SOLUTION) ×3 IMPLANT

## 2015-08-30 NOTE — Transfer of Care (Signed)
Immediate Anesthesia Transfer of Care Note  Patient: Emily [name]  Procedure(s) Performed: Procedure(s): LEFT CAROYID ENDARTERECTOMY WITH XENOSURE BOVINE PERICARDIUM PATCH ANGIOPLASTY (Left)  Patient Location: PACU  Anesthesia Type:General  Level of Consciousness: awake, alert , oriented and patient cooperative  Airway & Oxygen Therapy: Patient Spontanous Breathing and Patient connected to nasal cannula oxygen  Post-op Assessment: Report given to RN and Post -op Vital signs reviewed and stable  Post vital signs: Reviewed and stable  Last Vitals:  Filed Vitals:   08/30/15 0840  BP: 179/59  Pulse: 76  Temp: 36.6 C  Resp: 20    Complications: No apparent anesthesia complications

## 2015-08-30 NOTE — Op Note (Signed)
Patient name: Emily [name] MRN: [id number] DOB: [birth year] Sex: female  08/30/2015 Pre-operative Diagnosis: Asymptomatic   left carotid stenosis Post-operative diagnosis:  Same Surgeon:  Annamarie Major Assistants:  Curt Jews, M. Collins Procedure:    left carotid Endarterectomy with bovine pericardial patch angioplasty Anesthesia:  General Blood Loss:  See anesthesia record Specimens:  Carotid Plaque to pathology  Findings:  85 %stenosis; Thrombus:  none  Indications:  65 yo with bilateral carotid stenosis, left > right.  Asymptomatic  Procedure:  The patient was identified in the holding area and taken to [place] [facility]  The patient was then placed supine on the table.   General endotrachial anesthesia was administered.  The patient was prepped and draped in the usual sterile fashion.  A time out was called and antibiotics were administered.  The incision was made along the anterior border of the left sternocleidomastoid muscle.  Cautery was used to dissect through the subcutaneous tissue.  The platysma muscle was divided with cautery.  The internal jugular vein was exposed along its anterior medial border.  The common facial vein was exposed and then divided between 2-0 silk ties and metal clips.  The common carotid artery was then circumferentially exposed and encircled with an umbilical tape.  The vagus nerve was identified and protected.  Next sharp dissection was used to expose the external carotid artery and the superior thyroid artery.  The were encircled with a blue vessel loop and a 2-0 silk tie respectively.  Finally, the internal carotid was carefully dissected free.  An umbilical tape was placed around the internal carotid artery distal to the diseased segment.  The hypoglossal nerve was visualized throughout and protected.  The patient was given systemic heparinization.  A bovine carotid patch was selected and prepared on the back table.  A 10 french shunt was also prepared.   After blood pressure readings were appropriate and the heparin had been given time to circulate, the internal carotid artery was occluded with a baby Gregory clamp.  The external and common carotid arteries were then occluded with vascular clamps and the 2-0 tie tightened on the superior thyroid artery.  A #11 blade was used to make an arteriotomy in the common carotid artery.  This was extended with Potts scissors along the anterior and lateral border of the common and internal carotid artery.  Approximately 85% stenosis was identified.  There was no thrombus identified.  The 10 french shunt was not placed, as there was pulsatile back-bleeding.  A kleiner kuntz elevator was used to perform endarterectomy.  An eversion endarterectomy was performed in the external carotid artery.  A good distal endpoint was obtained in the internal carotid artery.  The specimen was removed and sent to pathology.  Heparinized saline was used to irrigate the endarterectomized field.  All potential embolic debris was removed.  Bovine pericardial patch angioplasty was then performed using a running 6-0 Prolene.  The common internal and external carotid arteries were all appropriately flushed. The artery was again irrigated with heparin saline.  The anastomosis was then secured. The clamp was first released on the external carotid artery followed by the common carotid artery approximately 30 seconds later, bloodflow was reestablish through the internal carotid artery.  Next, a hand-held  Doppler was used to evaluate the signals in the common, external, and internal  carotid arteries, all of which had appropriate signals. I then administered  50 mg protamine. The wound was then irrigated.  After hemostasis was  achieved, the carotid sheath was reapproximated with 3-0 Vicryl. The  platysma muscle was reapproximated with running 3-0 Vicryl. The skin  was closed with 4-0 Vicryl. Dermabond was placed on the skin. The  patient was then  successfully extubated. Her neurologic exam was  similar to his preprocedural exam. The patient was then taken to recovery room  in stable condition. There were no complications.     Disposition:  To PACU in stable condition.  Relevant Operative Details:  85 % stenosis at bifurcation.  No shunt utilized as there was pulsatile backbleeding from the internal carotid.  Normal anatomy.  Minimal dissection of hypoglossal  V. Annamarie Major, M.D. [facility] Office: [phone] Pager:  [phone]

## 2015-08-30 NOTE — Anesthesia Procedure Notes (Signed)
Procedure Name: Intubation Date/Time: 08/30/2015 10:30 AM Performed by: Emily [name] Pre-anesthesia Checklist: Patient identified, Emergency Drugs available, Suction available, Patient being monitored and Timeout performed Patient Re-evaluated:Patient Re-evaluated prior to inductionOxygen Delivery Method: Circle system utilized Preoxygenation: Pre-oxygenation with 100% oxygen Intubation Type: IV induction, Cricoid Pressure applied and Rapid sequence Laryngoscope Size: Glidescope and 3 Grade View: Grade I Tube type: Oral Number of attempts: 1 Airway Equipment and Method: Rigid stylet and Video-laryngoscopy Placement Confirmation: ETT inserted through vocal cords under direct vision,  positive ETCO2 and breath sounds checked- equal and bilateral Secured at: 22 cm Tube secured with: Tape Dental Injury: Teeth and Oropharynx as per pre-operative assessment  Difficulty Due To: Difficulty was anticipated

## 2015-08-30 NOTE — Progress Notes (Signed)
       Patient alert and oriented x 3.  CC left sided "teeth" pain.  Palpable radial pulses equal bilaterally Incision soft without hematoma Smile is symmetric and no tongue deviation   S/P left CEA Disposition stable Pending 3S bed in PACU  Zareah Hunzeker MAUREEN PA-C

## 2015-08-30 NOTE — Progress Notes (Signed)
HS CBG 230.  Paged Dr. Donnetta Hutching - ordered to give insulin per moderate sliding scale and continue checking CBGs ACHS.  Will continue to monitor.

## 2015-08-30 NOTE — Progress Notes (Signed)
Patient arrived to 3S02 from PACU via bed.  Pt is alert and calm and denies pain.  No belongings are with patient at the moment, family is waiting and has CPAP from home for patient to use.  Elink and CCMD notified of patient's arrival.  VSS.  Will continue to monitor.

## 2015-08-30 NOTE — OR Nursing (Signed)
Patient was neuro intact at 0950 08/30/2015 during pre-op assessment and denies any numbness, tingling, weakness or visual changes.

## 2015-08-30 NOTE — Interval H&P Note (Signed)
History and Physical Interval Note:  08/30/2015 9:34 AM  Emily [name]  has presented today for surgery, with the diagnosis of Left Internal Carotid Artery Stenosis  I65.22  The various methods of treatment have been discussed with the patient and family. After consideration of risks, benefits and other options for treatment, the patient has consented to  Procedure(s): ENDARTERECTOMY CAROTID-LEFT (Left) as a surgical intervention .  The patient's history has been reviewed, patient examined, no change in status, stable for surgery.  I have reviewed the patient's chart and labs.  Questions were answered to the patient's satisfaction.     Annamarie Major

## 2015-08-30 NOTE — Progress Notes (Signed)
Patient was assisted with setting up her home CPAP unit.  Humidifier chamber refilled with water.  Machine appears in good working order, patient is able to self-administer CPAP when ready.

## 2015-08-30 NOTE — H&P (View-Only) (Signed)
Patient name: Emily [name] MRN: [id number] DOB: 11-14-1950 Sex: female   Referred by: Dr. Gwenlyn Found  Reason for referral:  Chief Complaint  Patient presents with  . New Evaluation    eval bilateral carotid stenosis     HISTORY OF PRESENT ILLNESS: This is a 65 year old female who comes in today for evaluation of bilateral carotid stenosis, left greater than right.  She is asymptomatic.  Specifically, she denies numbness or weakness in either extremity.  She denies slurred speech.  She denies amaurosis fugax.  Her carotid disease was initially detected by auscultation of a bruit which led to an ultrasound.  The patient suffers from diabetes.  This is moderately well controlled, her last hemoglobin A1c was 7.7.  The patient is treated for hypercholesterolemia with a statin.  She is medically managed for hypertension.  She is on anticoagulation for atrial fibrillation.  She suffers from sleep apnea and uses a C Pap.  She states that she has been told she has a very small airway  Past Medical History  Diagnosis Date  . Diabetes mellitus without complication ([place])   . Hypertension   . Hypothyroid   . Arthritis   . PAF (paroxysmal atrial fibrillation) ([place])     a. newly dx in 09/2013; on eliquis  . Bilateral carotid artery disease ([place])   . Obesity   . Obstructive sleep apnea     on C Pap    Past Surgical History  Procedure Laterality Date  . Tonsillectomy    . Dilation and curettage of uterus      x 5  . Cyst on back of neck removed      Social History   Social History  . Marital Status: Single    Spouse Name: N/A  . Number of Children: N/A  . Years of Education: N/A   Occupational History  . Not on file.   Social History Main Topics  . Smoking status: Never Smoker   . Smokeless tobacco: Never Used  . Alcohol Use: No  . Drug Use: No  . Sexual Activity: Not on file   Other Topics Concern  . Not on file   Social History Narrative    Family History  Problem  Relation Age of Onset  . COPD Father   . Heart failure Father   . Heart disease Father   . Arrhythmia Sister   . Arrhythmia Sister   . Arrhythmia Sister     had PPM also  . Cancer Sister     Allergies as of 08/12/2015 - Review Complete 08/12/2015  Allergen Reaction Noted  . Quinapril hcl Cough 05/15/2014  . Statins Other (See Comments) 05/15/2014    Current Outpatient Prescriptions on File Prior to Visit  Medication Sig Dispense Refill  . apixaban (ELIQUIS) 5 MG TABS tablet Take 1 tablet (5 mg total) by mouth 2 (two) times daily. 60 tablet 2  . diltiazem (CARDIZEM LA) 180 MG 24 hr tablet Take 1 tablet (180 mg total) by mouth daily. **Please disregard the rx that was sent in for #15. Thanks** 90 tablet 1  . flecainide (TAMBOCOR) 150 MG tablet Take 2 tablets (300 mg total) by mouth as needed (As needed for atrial fibrillation). 60 tablet 2  . furosemide (LASIX) 20 MG tablet 20 mg. Take one tablet my mouth as needed    . glipiZIDE (GLUCOTROL) 5 MG tablet Take by mouth 2 (two) times daily before a meal.     . hydrochlorothiazide (HYDRODIURIL) 25  MG tablet Take 25 mg by mouth daily.    Marland Kitchen levothyroxine (SYNTHROID, LEVOTHROID) 112 MCG tablet Take 56-168 mcg by mouth See admin instructions. TAKES ONE TABLET DAILY. TAKES ONE-HALF TABLET IN ADDITION, EVERY OTHER DAY ONLY    . losartan (COZAAR) 50 MG tablet Take 50 mg by mouth daily.    Marland Kitchen lovastatin (MEVACOR) 20 MG tablet Take 20 mg by mouth every evening.     . metFORMIN (GLUCOPHAGE) 500 MG tablet Take 500 mg by mouth 2 (two) times daily.    . metoprolol succinate (TOPROL-XL) 100 MG 24 hr tablet Take 100 mg by mouth daily.     No current facility-administered medications on file prior to visit.     REVIEW OF SYSTEMS: See history of present illness for pertinent positives and negatives.  Otherwise all systems are negative  PHYSICAL EXAMINATION:  Filed Vitals:   08/12/15 1229 08/12/15 1238  BP: 163/75 173/71  Pulse: 57   Height: 5\' 2"   (1.575 m)   Weight: 250 lb (113.399 kg)   SpO2: 97%    Body mass index is 45.71 kg/(m^2). General: The patient appears their stated age.   HEENT:  No gross abnormalities Pulmonary: Respirations are non-labored Abdomen: Soft and non-tender  Musculoskeletal: There are no major deformities.   Neurologic: No focal weakness or paresthesias are detected, Skin: There are no ulcer or rashes noted. Psychiatric: The patient has normal affect. Cardiovascular: There is a regular rate and rhythm with systolic ejection murmur  Bilateral carotid bruit  Diagnostic Studies: I have reviewed her outside ultrasound which shows greater than 80% left carotid stenosis and 60-79% right carotid stenosis.  The bifurcation is at the hyoid.  The internal carotid artery is normal past the stenosis   Assessment:  Asymptomatic bilateral carotid stenosis, left greater than right Plan: I discussed with the patient proceeding with left carotid endarterectomy.  We discussed the risks and benefits which include but are not limited to the risk of nerve injury, the risk of stroke, and cardiopulmonary complications.  Her operation has been scheduled for Friday, March 31.  She will need to be off Eliquis for 72 hours.     Eldridge Abrahams, M.D. [facility] Office: [phone] Pager:  [phone]

## 2015-08-30 NOTE — Anesthesia Preprocedure Evaluation (Signed)
Anesthesia Evaluation  Patient identified by MRN, date of birth, ID band Patient awake    Reviewed: Allergy & Precautions, NPO status , Patient's Chart, lab work & pertinent test results  Airway Mallampati: II  TM Distance: >3 FB Neck ROM: Full    Dental  (+) Teeth Intact, Dental Advisory Given   Pulmonary    breath sounds clear to auscultation       Cardiovascular hypertension,  Rhythm:Regular     Neuro/Psych    GI/Hepatic   Endo/Other  diabetes  Renal/GU      Musculoskeletal   Abdominal (+) + obese,   Peds  Hematology   Anesthesia Other Findings   Reproductive/Obstetrics                             Anesthesia Physical Anesthesia Plan  ASA: III  Anesthesia Plan: General   Post-op Pain Management:    Induction: Intravenous  Airway Management Planned: Oral ETT  Additional Equipment: Arterial line  Intra-op Plan:   Post-operative Plan: Extubation in OR  Informed Consent: I have reviewed the patients History and Physical, chart, labs and discussed the procedure including the risks, benefits and alternatives for the proposed anesthesia with the patient or authorized representative who has indicated his/her understanding and acceptance.   Dental advisory given  Plan Discussed with: CRNA and Anesthesiologist  Anesthesia Plan Comments:         Anesthesia Quick Evaluation

## 2015-08-31 LAB — BASIC METABOLIC PANEL
ANION GAP: 8 (ref 5–15)
BUN: 15 mg/dL (ref 6–20)
CALCIUM: 8.2 mg/dL — AB (ref 8.9–10.3)
CO2: 24 mmol/L (ref 22–32)
CREATININE: 0.44 mg/dL (ref 0.44–1.00)
Chloride: 103 mmol/L (ref 101–111)
Glucose, Bld: 224 mg/dL — ABNORMAL HIGH (ref 65–99)
Potassium: 3.6 mmol/L (ref 3.5–5.1)
Sodium: 135 mmol/L (ref 135–145)

## 2015-08-31 LAB — CBC
HCT: 30.9 % — ABNORMAL LOW (ref 36.0–46.0)
Hemoglobin: 9.5 g/dL — ABNORMAL LOW (ref 12.0–15.0)
MCH: 26.4 pg (ref 26.0–34.0)
MCHC: 30.7 g/dL (ref 30.0–36.0)
MCV: 85.8 fL (ref 78.0–100.0)
PLATELETS: 213 10*3/uL (ref 150–400)
RBC: 3.6 MIL/uL — ABNORMAL LOW (ref 3.87–5.11)
RDW: 15.4 % (ref 11.5–15.5)
WBC: 9.6 10*3/uL (ref 4.0–10.5)

## 2015-08-31 LAB — GLUCOSE, CAPILLARY: Glucose-Capillary: 230 mg/dL — ABNORMAL HIGH (ref 65–99)

## 2015-08-31 LAB — HEMOGLOBIN A1C
HEMOGLOBIN A1C: 8 % — AB (ref 4.8–5.6)
MEAN PLASMA GLUCOSE: 183 mg/dL

## 2015-08-31 NOTE — Progress Notes (Addendum)
[facility] [place]  Subjective  - Doing well.  The tooth pain is better this am.  She has voided and is tolerating PO liquids well.   Objective 131/61 64 97.7 F (36.5 C) (Oral) 18 90%  Intake/Output Summary (Last 24 hours) at 08/31/15 [id number] Last data filed at 08/31/15 0600  Gross per 24 hour  Intake 2613.33 ml  Output    365 ml  Net 2248.33 ml    Palpable radial pulses equal bilateral Incision with minimal edema, no frank hematoma No tongue deviation and smile is symmetric Heart RRR Lungs non labored breathing  Assessment/Planning: POD #1 left CEA  Pending tolerating breakfast plan is to discharge home today F/U in 2 weeks with Dr. Fae Pippin, [facility] 08/31/2015 8:08 AM --  Laboratory Lab Results:  Recent Labs  08/30/15 2110 08/31/15 0413  WBC 11.1* 9.6  HGB 10.1* 9.5*  HCT 32.5* 30.9*  PLT 213 213   BMET  Recent Labs  08/30/15 2110 08/31/15 0413  NA  --  135  K  --  3.6  CL  --  103  CO2  --  24  GLUCOSE  --  224*  BUN  --  15  CREATININE 0.47 0.44  CALCIUM  --  8.2*    COAG Lab Results  Component Value Date   INR 1.10 08/27/2015   INR 1.16 05/26/2014   No results found for: PTT    I have examined the patient, reviewed and agree with above. Healing nicely. Hemodynamically stable. Neurologically intact. Okay for discharge today  Curt Jews, MD 08/31/2015 9:38 AM

## 2015-08-31 NOTE — Progress Notes (Signed)
Discussed with patient and family discharge instructions, both verbalized agreement and understanding.  Patient's IV was discontinued with no complications.  Patient to go down in wheelchair with all belongings to go home in private vehicle.

## 2015-08-31 NOTE — Progress Notes (Signed)
Pt ambulated approximately 300 ft around the unit on RA.  Oxygen saturations and HR remained within normal limits during ambulation.  Pt tolerated ambulation well.  Pt is now sitting in the chair with call bell placed within reach.  Will continue to monitor.

## 2015-08-31 NOTE — Progress Notes (Signed)
Pt stated that she has received the Flu vaccine this year.  Doc Flowsheets will not allow documentation of this.

## 2015-09-02 ENCOUNTER — Telehealth: Payer: Self-pay | Admitting: Surgery

## 2015-09-02 ENCOUNTER — Encounter (HOSPITAL_COMMUNITY): Payer: Self-pay | Admitting: Surgery

## 2015-09-02 NOTE — Telephone Encounter (Signed)
-----   Message from Mena Goes, RN sent at 08/30/2015  3:40 PM EDT ----- Regarding: schedule   ----- Message -----    From: Ulyses Amor, PA-C    Sent: 08/30/2015  12:52 PM      To: Vvs Charge Pool  Dr. Trula Slade f/u in 2 weeks s/p left CEA

## 2015-09-02 NOTE — Telephone Encounter (Signed)
Sched appt for 4/17 at 9:30.  Lm on hm# to inform pt of appt.

## 2015-09-03 ENCOUNTER — Emergency Department (HOSPITAL_COMMUNITY)
Admission: EM | Admit: 2015-09-03 | Discharge: 2015-09-03 | Disposition: A | Discharge status: Home / Self Care | Payer: Medicare Other | Attending: Emergency Medicine | Admitting: Emergency Medicine

## 2015-09-03 ENCOUNTER — Encounter (HOSPITAL_COMMUNITY): Payer: Self-pay

## 2015-09-03 DIAGNOSIS — Z79899 Other long term (current) drug therapy: Secondary | ICD-10-CM | POA: Insufficient documentation

## 2015-09-03 DIAGNOSIS — I480 Paroxysmal atrial fibrillation: Secondary | ICD-10-CM | POA: Diagnosis not present

## 2015-09-03 DIAGNOSIS — Z9981 Dependence on supplemental oxygen: Secondary | ICD-10-CM | POA: Insufficient documentation

## 2015-09-03 DIAGNOSIS — I4891 Unspecified atrial fibrillation: Secondary | ICD-10-CM | POA: Insufficient documentation

## 2015-09-03 DIAGNOSIS — I10 Essential (primary) hypertension: Secondary | ICD-10-CM | POA: Diagnosis not present

## 2015-09-03 DIAGNOSIS — R011 Cardiac murmur, unspecified: Secondary | ICD-10-CM | POA: Insufficient documentation

## 2015-09-03 DIAGNOSIS — E669 Obesity, unspecified: Secondary | ICD-10-CM | POA: Diagnosis not present

## 2015-09-03 DIAGNOSIS — E119 Type 2 diabetes mellitus without complications: Secondary | ICD-10-CM | POA: Diagnosis not present

## 2015-09-03 DIAGNOSIS — Z7984 Long term (current) use of oral hypoglycemic drugs: Secondary | ICD-10-CM | POA: Diagnosis not present

## 2015-09-03 DIAGNOSIS — G4733 Obstructive sleep apnea (adult) (pediatric): Secondary | ICD-10-CM | POA: Insufficient documentation

## 2015-09-03 DIAGNOSIS — Z862 Personal history of diseases of the blood and blood-forming organs and certain disorders involving the immune mechanism: Secondary | ICD-10-CM | POA: Insufficient documentation

## 2015-09-03 DIAGNOSIS — E039 Hypothyroidism, unspecified: Secondary | ICD-10-CM | POA: Diagnosis not present

## 2015-09-03 DIAGNOSIS — R002 Palpitations: Secondary | ICD-10-CM | POA: Diagnosis present

## 2015-09-03 DIAGNOSIS — R000 Tachycardia, unspecified: Secondary | ICD-10-CM | POA: Diagnosis not present

## 2015-09-03 LAB — CBC WITH DIFFERENTIAL/PLATELET
Basophils Absolute: 0 10*3/uL (ref 0.0–0.1)
Basophils Relative: 0 %
Eosinophils Absolute: 0.1 10*3/uL (ref 0.0–0.7)
Eosinophils Relative: 1 %
HCT: 33.3 % — ABNORMAL LOW (ref 36.0–46.0)
Hemoglobin: 10.3 g/dL — ABNORMAL LOW (ref 12.0–15.0)
Lymphocytes Relative: 10 %
Lymphs Abs: 0.7 10*3/uL (ref 0.7–4.0)
MCH: 26.6 pg (ref 26.0–34.0)
MCHC: 30.9 g/dL (ref 30.0–36.0)
MCV: 86 fL (ref 78.0–100.0)
Monocytes Absolute: 0.5 10*3/uL (ref 0.1–1.0)
Monocytes Relative: 7 %
Neutro Abs: 5.9 10*3/uL (ref 1.7–7.7)
Neutrophils Relative %: 82 %
Platelets: 209 10*3/uL (ref 150–400)
RBC: 3.87 MIL/uL (ref 3.87–5.11)
RDW: 15.7 % — ABNORMAL HIGH (ref 11.5–15.5)
WBC: 7.2 10*3/uL (ref 4.0–10.5)

## 2015-09-03 LAB — BASIC METABOLIC PANEL
Anion gap: 14 (ref 5–15)
BUN: 14 mg/dL (ref 6–20)
CO2: 23 mmol/L (ref 22–32)
Calcium: 8.2 mg/dL — ABNORMAL LOW (ref 8.9–10.3)
Chloride: 97 mmol/L — ABNORMAL LOW (ref 101–111)
Creatinine, Ser: 0.51 mg/dL (ref 0.44–1.00)
GFR calc Af Amer: >60 mL/min (ref >60)
GFR calc non Af Amer: >60 mL/min (ref >60)
Glucose, Bld: 256 mg/dL — ABNORMAL HIGH (ref 65–99)
Potassium: 2.9 mmol/L — ABNORMAL LOW (ref 3.5–5.1)
Sodium: 134 mmol/L — ABNORMAL LOW (ref 135–145)

## 2015-09-03 LAB — TROPONIN I: Troponin I: <0.03 ng/mL (ref <0.031)

## 2015-09-03 LAB — MAGNESIUM: Magnesium: 1.7 mg/dL (ref 1.7–2.4)

## 2015-09-03 MED ORDER — POTASSIUM CHLORIDE CRYS ER 20 MEQ PO TBCR
60.0000 meq | EXTENDED_RELEASE_TABLET | Freq: Once | ORAL | Status: AC
  Administered 2015-09-03: 60 meq via ORAL
  Filled 2015-09-03: qty 3

## 2015-09-03 NOTE — Discharge Instructions (Signed)

## 2015-09-03 NOTE — ED Notes (Signed)
Per EMS - pt from home. Pt woke up this morning, pale/diaphoretic/heart felt out of rhythm. Pt called EMS. Afib - 160-200bpm, given 10mg  cardizem, converted into hr 70-80. Last BP 130/70. 20G right hand.

## 2015-09-03 NOTE — Discharge Summary (Signed)
[facility] Discharge Summary   Patient ID:  Emily [name] MRN: [id number] DOB/AGE: 08-08-1950 65 y.o.  Admit date: 08/30/2015 Discharge date: 09/03/2015 Date of Surgery: 08/30/2015 Surgeon: Surgeon(s): [name], MD Rosetta Posner, MD  Admission Diagnosis: Left Internal Carotid Artery Stenosis  I65.22  Discharge Diagnoses:  Left Internal Carotid Artery Stenosis  I65.22  Secondary Diagnoses: Past Medical History  Diagnosis Date  . Diabetes mellitus without complication ([place])   . Hypertension   . Hypothyroid   . Arthritis   . PAF (paroxysmal atrial fibrillation) ([place])     a. newly dx in 09/2013; on eliquis  . Bilateral carotid artery disease ([place])   . Obesity   . Obstructive sleep apnea     on C Pap  . Dysrhythmia   . Heart murmur   . Difficult intubation     states 'lady that did the sleep study told me I have the smallest airway she has ever seen in an adult"  . Anemia     Procedure(s): LEFT CAROYID ENDARTERECTOMY WITH XENOSURE BOVINE PERICARDIUM PATCH ANGIOPLASTY  Discharged Condition: good  HPI: This is a 65 year old female who comes in today for evaluation of bilateral carotid stenosis, left greater than right. She is asymptomatic. Specifically, she denies numbness or weakness in either extremity. She denies slurred speech. She denies amaurosis fugax. Her carotid disease was initially detected by auscultation of a bruit which led to an ultrasound.  The patient suffers from diabetes. This is moderately well controlled, her last hemoglobin A1c was 7.7. The patient is treated for hypercholesterolemia with a statin. She is medically managed for hypertension. She is on anticoagulation for atrial fibrillation. She suffers from sleep apnea and uses a C Pap. She states that she has been told she has a very small airway.  Diagnostic Studies: I have reviewed her outside ultrasound which shows greater than 80% left carotid stenosis and 60-79%  right carotid stenosis. The bifurcation is at the hyoid. The internal carotid artery is normal past the stenosis   Assessment:  Asymptomatic bilateral carotid stenosis, left greater than right Plan: I discussed with the patient proceeding with left carotid endarterectomy. We discussed the risks and benefits which include but are not limited to the risk of nerve injury, the risk of stroke, and cardiopulmonary complications. Her operation has been scheduled for Friday, March 31. She will need to be off Eliquis for 72 hours.  Hospital Course:  Emily [name] is a 65 y.o. female is S/P Left Procedure(s): LEFT CAROYID ENDARTERECTOMY WITH XENOSURE BOVINE PERICARDIUM PATCH ANGIOPLASTY    Palpable radial pulses equal bilateral Incision with minimal edema, no frank hematoma No tongue deviation and smile is symmetric Heart RRR Lungs non labored breathing  Assessment/Planning: POD #1 left CEA  Pending tolerating breakfast plan is to discharge home today F/U in 2 weeks with Dr. Trula Slade  Significant Diagnostic Studies: CBC Lab Results  Component Value Date   WBC 7.2 09/03/2015   HGB 10.3* 09/03/2015   HCT 33.3* 09/03/2015   MCV 86.0 09/03/2015   PLT 209 09/03/2015    BMET    Component Value Date/Time   NA 135 08/31/2015 0413   K 3.6 08/31/2015 0413   CL 103 08/31/2015 0413   CO2 24 08/31/2015 0413   GLUCOSE 224* 08/31/2015 0413   BUN 15 08/31/2015 0413   CREATININE 0.44 08/31/2015 0413   CALCIUM 8.2* 08/31/2015 0413   GFRNONAA >60 08/31/2015 0413   GFRAA >60 08/31/2015 0413   COAG Lab  Results  Component Value Date   INR 1.10 08/27/2015   INR 1.16 05/26/2014     Disposition:  Discharge to :Home Discharge Instructions    Call MD for:  redness, tenderness, or signs of infection (pain, swelling, bleeding, redness, odor or green/yellow discharge around incision site)    Complete by:  As directed      Call MD for:  severe or increased pain, loss or decreased  feeling  in affected limb(s)    Complete by:  As directed      Call MD for:  temperature >100.5    Complete by:  As directed      Discharge instructions    Complete by:  As directed   You may shower this evening     Discharge patient    Complete by:  As directed   Discharge pt to home     Driving Restrictions    Complete by:  As directed   No driving for 2 weeks     Lifting restrictions    Complete by:  As directed   No heavy lifting for 6 weeks     Resume previous diet    Complete by:  As directed             Medication List    TAKE these medications        apixaban 5 MG Tabs tablet  Commonly known as:  ELIQUIS  Take 1 tablet (5 mg total) by mouth 2 (two) times daily.     diltiazem 180 MG 24 hr tablet  Commonly known as:  CARDIZEM LA  Take 1 tablet (180 mg total) by mouth daily. **Please disregard the rx that was sent in for #15. Thanks**     flecainide 150 MG tablet  Commonly known as:  TAMBOCOR  Take 2 tablets (300 mg total) by mouth as needed (As needed for atrial fibrillation).     glipiZIDE 5 MG tablet  Commonly known as:  GLUCOTROL  Take by mouth 2 (two) times daily before a meal.     hydrochlorothiazide 25 MG tablet  Commonly known as:  HYDRODIURIL  Take 25 mg by mouth daily.     ibuprofen 200 MG tablet  Commonly known as:  ADVIL,MOTRIN  Take 600 mg by mouth daily as needed for moderate pain.     LASIX 20 MG tablet  Generic drug:  furosemide  Take 20 mg by mouth daily as needed for fluid.     levothyroxine 112 MCG tablet  Commonly known as:  SYNTHROID, LEVOTHROID  Take 56-168 mcg by mouth See admin instructions. TAKES ONE TABLET DAILY. TAKES ONE-HALF TABLET IN ADDITION, EVERY OTHER DAY ONLY     losartan 50 MG tablet  Commonly known as:  COZAAR  Take 50 mg by mouth daily.     lovastatin 20 MG tablet  Commonly known as:  MEVACOR  Take 20 mg by mouth every evening.     metFORMIN 500 MG tablet  Commonly known as:  GLUCOPHAGE  Take 500 mg by  mouth 2 (two) times daily.     metoprolol succinate 100 MG 24 hr tablet  Commonly known as:  TOPROL-XL  Take 100 mg by mouth daily.     oxyCODONE-acetaminophen 5-325 MG tablet  Commonly known as:  PERCOCET/ROXICET  Take 1 tablet by mouth every 4 (four) hours as needed.       Verbal and written Discharge instructions given to the patient. Wound care per Discharge AVS  Follow-up Information    Follow up with Annamarie Major, MD In 2 weeks.   Specialties:  Vascular Surgery, Cardiology   Why:  Office will call you to arrange your appt (sent)   Contact information:   [place] [address] [phone]       Signed: [facility] 09/03/2015, 7:44 AM  --- For VQI Registry use --- Instructions: Press F2 to tab through selections.  Delete question if not applicable.   Modified Rankin score at D/C (0-6): Rankin Score=0  IV medication needed for:  1. Hypertension: No 2. Hypotension: No  Post-op Complications: No  1. Post-op CVA or TIA: No  If yes: Event classification (right eye, left eye, right cortical, left cortical, verterobasilar, other):   If yes: Timing of event (intra-op, <6 hrs post-op, >=6 hrs post-op, unknown):   2. CN injury: No  If yes: CN  injuried   3. Myocardial infarction: No  If yes: Dx by (EKG or clinical, Troponin):   4.  CHF: No  5.  Dysrhythmia (new): No  6. Wound infection: No  7. Reperfusion symptoms: No  8. Return to OR: No  If yes: return to OR for (bleeding, neurologic, other CEA incision, other):   Discharge medications: Statin use:  No  for medical reason   ASA use:  No  for medical reason   Beta blocker use:  Yes ACE-Inhibitor use:  No  for medical reason   P2Y12 Antagonist use: [ ]  None, [ ]  Plavix, [ ]  Plasugrel, [ ]  Ticlopinine, [ ]  Ticagrelor, [ ]  Other, [ ]  No for medical reason, [ ]  Non-compliant, [ ]  Not-indicated Anti-coagulant use:  [ ]  None, [ ]  Warfarin, [ ]  Rivaroxaban, [ ]  Dabigatran, [ ]   Other, [ ]  No for medical reason, [ ]  Non-compliant, [ ]  Not-indicated [x]  Apixaban

## 2015-09-03 NOTE — ED Provider Notes (Signed)
CSN: [id number]     Arrival date & time 09/03/15  0709 History   First MD Initiated Contact with Patient 09/03/15 0710     Chief Complaint  Patient presents with  . Atrial Fibrillation     (Consider location/radiation/quality/duration/timing/severity/associated sxs/prior Treatment) HPI   64yF with atrial fib with RVR. Hx of the same. Woke up 0530 today with palpitations and felt "awful." Denies CP. She went to bed in her usual state of health. She recognized that she was in afib and took 300mg  of flecainide. She says previously she has converted in about 45 minutes with this. She continued to feel very poorly so ended up calling EMS. On their arrival she was diaphoretic, pale and looked ill. Initial rhythm for EMS was afib with ventricular rate of 160-200. She was given 10mg  of cardizem and converted about 10 minutes later. By the time she arrived to the ED she felt significantly better and was essentially asymptomatic. She just had L CEA on 3/31. She reports that she has been recovering well and has since restarted eliquis. Cardiologist: Dr. Percival Spanish.   Past Medical History  Diagnosis Date  . Diabetes mellitus without complication ([place])   . Hypertension   . Hypothyroid   . Arthritis   . PAF (paroxysmal atrial fibrillation) ([place])     a. newly dx in 09/2013; on eliquis  . Bilateral carotid artery disease ([place])   . Obesity   . Obstructive sleep apnea     on C Pap  . Dysrhythmia   . Heart murmur   . Difficult intubation     states 'lady that did the sleep study told me I have the smallest airway she has ever seen in an adult"  . Anemia    Past Surgical History  Procedure Laterality Date  . Tonsillectomy    . Dilation and curettage of uterus      x 5  . Cyst on back of neck removed    . Colonoscopy w/ polypectomy    . Eye surgery    . Cataract extraction Left   . Endarterectomy Left 08/30/2015    Procedure: LEFT CAROYID ENDARTERECTOMY WITH XENOSURE BOVINE PERICARDIUM PATCH  ANGIOPLASTY;  Surgeon: [name], MD;  Location: [facility] OR;  Service: Vascular;  Laterality: Left;   Family History  Problem Relation Age of Onset  . COPD Father   . Heart failure Father   . Heart disease Father   . Arrhythmia Sister   . Arrhythmia Sister   . Arrhythmia Sister     had PPM also  . Cancer Sister    Social History  Substance Use Topics  . Smoking status: Never Smoker   . Smokeless tobacco: Never Used  . Alcohol Use: No   OB History    No data available     Review of Systems  All systems reviewed and negative, other than as noted in HPI.   Allergies  Quinapril hcl; Statins; and Tape  Home Medications   Prior to Admission medications   Medication Sig Start Date End Date Taking? Authorizing Provider  apixaban (ELIQUIS) 5 MG TABS tablet Take 1 tablet (5 mg total) by mouth 2 (two) times daily. 07/17/15   [name], MD  diltiazem (CARDIZEM LA) 180 MG 24 hr tablet Take 1 tablet (180 mg total) by mouth daily. **Please disregard the rx that was sent in for #15. Thanks** 02/06/15   Minus Breeding, MD  flecainide (TAMBOCOR) 150 MG tablet Take 2 tablets (300 mg  total) by mouth as needed (As needed for atrial fibrillation). Patient taking differently: Take 300 mg by mouth daily as needed (As needed for atrial fibrillation).  02/01/15   Minus Breeding, MD  furosemide (LASIX) 20 MG tablet Take 20 mg by mouth daily as needed for fluid.  10/02/14 10/02/15  Historical Provider, MD  glipiZIDE (GLUCOTROL) 5 MG tablet Take by mouth 2 (two) times daily before a meal.  03/26/15 03/25/16  Historical Provider, MD  hydrochlorothiazide (HYDRODIURIL) 25 MG tablet Take 25 mg by mouth daily. 08/02/13   Historical Provider, MD  ibuprofen (ADVIL,MOTRIN) 200 MG tablet Take 600 mg by mouth daily as needed for moderate pain.    Historical Provider, MD  levothyroxine (SYNTHROID, LEVOTHROID) 112 MCG tablet Take 56-168 mcg by mouth See admin instructions. TAKES ONE TABLET DAILY. TAKES ONE-HALF  TABLET IN ADDITION, EVERY OTHER DAY ONLY    Historical Provider, MD  losartan (COZAAR) 50 MG tablet Take 50 mg by mouth daily. 10/24/13   Historical Provider, MD  lovastatin (MEVACOR) 20 MG tablet Take 20 mg by mouth every evening.  08/12/13   Historical Provider, MD  metFORMIN (GLUCOPHAGE) 500 MG tablet Take 500 mg by mouth 2 (two) times daily. 09/26/13   Historical Provider, MD  metoprolol succinate (TOPROL-XL) 100 MG 24 hr tablet Take 100 mg by mouth daily. 08/02/13   Historical Provider, MD  oxyCODONE-acetaminophen (PERCOCET/ROXICET) 5-325 MG tablet Take 1 tablet by mouth every 4 (four) hours as needed. 08/30/15   Ulyses Amor, PA-C   There were no vitals taken for this visit. Physical Exam  Constitutional: She appears well-developed and well-nourished. No distress.  HENT:  Head: Normocephalic and atraumatic.  L CEA surgical site appears to be healing w/o complication.  Eyes: Conjunctivae are normal. Right eye exhibits no discharge. Left eye exhibits no discharge.  Neck: Neck supple.  Cardiovascular: Normal rate, regular rhythm and normal heart sounds.  Exam reveals no gallop and no friction rub.   No murmur heard. Pulmonary/Chest: Effort normal and breath sounds normal. No respiratory distress.  Abdominal: Soft. She exhibits no distension. There is no tenderness.  Musculoskeletal: She exhibits no edema or tenderness.  Neurological: She is alert.  Skin: Skin is warm and dry.  Psychiatric: She has a normal mood and affect. Her behavior is normal. Thought content normal.  Nursing note and vitals reviewed.   ED Course  Procedures (including critical care time) Labs Review Labs Reviewed  BASIC METABOLIC PANEL - Abnormal; Notable for the following:    Sodium 134 (*)    Potassium 2.9 (*)    Chloride 97 (*)    Glucose, Bld 256 (*)    Calcium 8.2 (*)    All other components within normal limits  CBC WITH DIFFERENTIAL/PLATELET - Abnormal; Notable for the following:    Hemoglobin 10.3 (*)     HCT 33.3 (*)    RDW 15.7 (*)    All other components within normal limits  TROPONIN I  MAGNESIUM    Imaging Review No results found. I have personally reviewed and evaluated these images and lab results as part of my medical decision-making.   EKG Interpretation   Date/Time:  Tuesday September 03 2015 07:20:46 EDT Ventricular Rate:  76 PR Interval:  169 QRS Duration: 111 QT Interval:  437 QTC Calculation: 491 R Axis:   -27 Text Interpretation:  Sinus rhythm Borderline left axis deviation  Confirmed by Wilson Singer  MD, Ryelan Kazee ([id number]) on 09/03/2015 8:14:13 AM      MDM  Final diagnoses:  Atrial fibrillation with rapid ventricular response ([place])    64yF with afib with RVR. Hx of the same. Now converted without continued symptoms. Will observe in the ED for a little while and check some basic labs. Likely discharge if stays in sinus and has no further complaints.   8:15 AM Remains asymptomatic. Monitor continues to show sinus rhythm. Hypokalemia noted. Supplementation given. At this time, I feel she is appropriate for discharge.     Virgel Manifold, MD 09/09/15 1430

## 2015-09-06 ENCOUNTER — Encounter: Payer: Self-pay | Admitting: Surgery

## 2015-09-10 NOTE — Anesthesia Postprocedure Evaluation (Signed)
Anesthesia Post Note  Patient: Emily [name]  Procedure(s) Performed: Procedure(s) (LRB): LEFT CAROYID ENDARTERECTOMY WITH XENOSURE BOVINE PERICARDIUM PATCH ANGIOPLASTY (Left)  Patient location during evaluation: PACU Anesthesia Type: General Level of consciousness: awake, awake and alert and oriented Pain management: pain level controlled Vital Signs Assessment: post-procedure vital signs reviewed and stable Respiratory status: spontaneous breathing, nonlabored ventilation and respiratory function stable Cardiovascular status: blood pressure returned to baseline Anesthetic complications: no    Last Vitals:  Filed Vitals:   08/31/15 0344 08/31/15 0817  BP: 131/61 138/54  Pulse: 64 65  Temp: 36.5 C 36.4 C  Resp: 18 16    Last Pain:  Filed Vitals:   08/31/15 0818  PainSc: 0-No pain                 Jacori Mulrooney COKER

## 2015-09-16 ENCOUNTER — Ambulatory Visit (INDEPENDENT_AMBULATORY_CARE_PROVIDER_SITE_OTHER): Payer: Medicare Other | Admitting: Surgery

## 2015-09-16 VITALS — BP 140/62 | HR 64 | Temp 97.5°F | Resp 20 | Ht 63.0 in | Wt 246.0 lb

## 2015-09-16 DIAGNOSIS — I6523 Occlusion and stenosis of bilateral carotid arteries: Secondary | ICD-10-CM

## 2015-09-16 NOTE — Progress Notes (Signed)
Patient name: Emily [name] MRN: [id number] DOB: 08-31-1950 Sex: female  REASON FOR VISIT: Follow-up  HPI: Emily [name] is a 65 y.o. female who is status post left carotid endarterectomy with bovine pericardial patch angioplasty on 3 07/31/2015.  This was done for asymptomatic bilateral carotid stenosis, left greater than right.  Intraoperative findings included a 85% stenosis.  Her postoperative follow-up was uncomplicated and she was discharged to home the following day.  She is back today with no neurologic deficits.  She does however state that she gets numbness in her left middle finger.  She was a very difficult A-line placement.  Current Outpatient Prescriptions  Medication Sig Dispense Refill  . apixaban (ELIQUIS) 5 MG TABS tablet Take 1 tablet (5 mg total) by mouth 2 (two) times daily. 60 tablet 2  . diltiazem (CARDIZEM LA) 180 MG 24 hr tablet Take 1 tablet (180 mg total) by mouth daily. **Please disregard the rx that was sent in for #15. Thanks** 90 tablet 1  . flecainide (TAMBOCOR) 150 MG tablet Take 2 tablets (300 mg total) by mouth as needed (As needed for atrial fibrillation). (Patient taking differently: Take 300 mg by mouth daily as needed (As needed for atrial fibrillation). ) 60 tablet 2  . furosemide (LASIX) 20 MG tablet Take 20 mg by mouth daily as needed for fluid.     Marland Kitchen glipiZIDE (GLUCOTROL) 5 MG tablet Take by mouth 2 (two) times daily before a meal.     . hydrochlorothiazide (HYDRODIURIL) 25 MG tablet Take 25 mg by mouth daily.    Marland Kitchen ibuprofen (ADVIL,MOTRIN) 200 MG tablet Take 600 mg by mouth daily as needed for moderate pain.    Marland Kitchen levothyroxine (SYNTHROID, LEVOTHROID) 112 MCG tablet Take 56-168 mcg by mouth See admin instructions. TAKES ONE TABLET DAILY. TAKES ONE-HALF TABLET IN ADDITION, EVERY OTHER DAY ONLY    . losartan (COZAAR) 50 MG tablet Take 50 mg by mouth daily.    Marland Kitchen lovastatin (MEVACOR) 20 MG tablet Take 20 mg by mouth every evening.     . metFORMIN  (GLUCOPHAGE) 500 MG tablet Take 500 mg by mouth 2 (two) times daily.    . metoprolol succinate (TOPROL-XL) 100 MG 24 hr tablet Take 100 mg by mouth daily.    Marland Kitchen oxyCODONE-acetaminophen (PERCOCET/ROXICET) 5-325 MG tablet Take 1 tablet by mouth every 4 (four) hours as needed. 30 tablet 0   No current facility-administered medications for this visit.    REVIEW OF SYSTEMS:  [X]  denotes positive finding, [ ]  denotes negative finding Cardiac  Comments:  Chest pain or chest pressure:    Shortness of breath upon exertion:    Short of breath when lying flat:    Irregular heart rhythm:    Constitutional    Fever or chills:      PHYSICAL EXAM: Filed Vitals:   09/16/15 0917  BP: 140/62  Pulse: 64  Temp: 97.5 F (36.4 C)  TempSrc: Oral  Resp: 20  Height: 5\' 3"  (1.6 m)  Weight: 246 lb (111.585 kg)  SpO2: 97%    GENERAL: The patient is a well-nourished female, in no acute distress. The vital signs are documented above. CARDIOVASCULAR: There is a regular rate and rhythm. PULMONARY: There is good air exchange bilaterally without wheezing or rales. Left carotid incision is healed nicely.  She is neurologically intact.  Tongue is midline.  Smile is symmetric.  Strength is equal bilateral.  She has palpable left brachial and radial pulse  MEDICAL ISSUES: Status post left  carotid endarterectomy.  The patient will follow-up in 9 months with a repeat carotid duplex.  I suspect that numbness in the left middle finger is secondary to attempts at A-line placement.  I have reassured her that this should improve over time.  If it does not, she will get back in touch with me prior to her next office visit.  Annamarie Major [facility] [employer]: [phone]

## 2015-09-20 ENCOUNTER — Other Ambulatory Visit: Payer: Self-pay

## 2015-09-20 ENCOUNTER — Other Ambulatory Visit: Payer: Self-pay | Admitting: *Deleted

## 2015-09-20 MED ORDER — APIXABAN 5 MG PO TABS: 5.0000 mg | ORAL_TABLET | Freq: Two times a day (BID) | ORAL | Status: DC

## 2015-09-24 ENCOUNTER — Telehealth: Payer: Self-pay | Admitting: Cardiology

## 2015-09-24 ENCOUNTER — Other Ambulatory Visit: Payer: Self-pay | Admitting: *Deleted

## 2015-09-24 ENCOUNTER — Other Ambulatory Visit: Payer: Self-pay | Admitting: Cardiology

## 2015-09-24 MED ORDER — APIXABAN 5 MG PO TABS: 5.0000 mg | ORAL_TABLET | Freq: Two times a day (BID) | ORAL | Status: DC

## 2015-09-24 NOTE — Telephone Encounter (Signed)
Refill sent, left msg for pt to call.

## 2015-09-24 NOTE — Telephone Encounter (Signed)
Per pharmacist at [facility], they never received the rx for eliquis.

## 2015-09-24 NOTE — Telephone Encounter (Signed)
Follow up   Pt verbalized that is calling to speak to rn about her medications and getting her Eliquis refilled  and stated that the [facility] [place] states that they haven't heard back from [facility]  She has been trying to get it filled since friday

## 2015-10-22 ENCOUNTER — Other Ambulatory Visit: Payer: Self-pay

## 2015-10-22 MED ORDER — APIXABAN 5 MG PO TABS: 5.0000 mg | ORAL_TABLET | Freq: Two times a day (BID) | ORAL | Status: DC

## 2015-10-25 NOTE — Addendum Note (Signed)
Addended by: Thresa Ross C on: 10/25/2015 11:52 AM   Modules accepted: Orders

## 2015-11-04 DIAGNOSIS — Z961 Presence of intraocular lens: Secondary | ICD-10-CM | POA: Diagnosis not present

## 2015-11-04 DIAGNOSIS — H25811 Combined forms of age-related cataract, right eye: Secondary | ICD-10-CM | POA: Diagnosis not present

## 2015-11-04 DIAGNOSIS — E119 Type 2 diabetes mellitus without complications: Secondary | ICD-10-CM | POA: Diagnosis not present

## 2015-11-05 ENCOUNTER — Encounter (HOSPITAL_COMMUNITY): Payer: Self-pay

## 2015-11-05 ENCOUNTER — Emergency Department (HOSPITAL_COMMUNITY)
Admission: EM | Admit: 2015-11-05 | Discharge: 2015-11-06 | Disposition: A | Discharge status: Home / Self Care | Payer: Medicare Other | Attending: Emergency Medicine | Admitting: Emergency Medicine

## 2015-11-05 ENCOUNTER — Emergency Department (HOSPITAL_COMMUNITY): Payer: Medicare Other

## 2015-11-05 DIAGNOSIS — R0602 Shortness of breath: Secondary | ICD-10-CM | POA: Diagnosis not present

## 2015-11-05 DIAGNOSIS — M1990 Unspecified osteoarthritis, unspecified site: Secondary | ICD-10-CM | POA: Diagnosis not present

## 2015-11-05 DIAGNOSIS — G4733 Obstructive sleep apnea (adult) (pediatric): Secondary | ICD-10-CM | POA: Diagnosis not present

## 2015-11-05 DIAGNOSIS — I4891 Unspecified atrial fibrillation: Secondary | ICD-10-CM | POA: Insufficient documentation

## 2015-11-05 DIAGNOSIS — Z79899 Other long term (current) drug therapy: Secondary | ICD-10-CM | POA: Insufficient documentation

## 2015-11-05 DIAGNOSIS — E1165 Type 2 diabetes mellitus with hyperglycemia: Secondary | ICD-10-CM | POA: Insufficient documentation

## 2015-11-05 DIAGNOSIS — I2510 Atherosclerotic heart disease of native coronary artery without angina pectoris: Secondary | ICD-10-CM | POA: Insufficient documentation

## 2015-11-05 DIAGNOSIS — Z7901 Long term (current) use of anticoagulants: Secondary | ICD-10-CM | POA: Insufficient documentation

## 2015-11-05 DIAGNOSIS — Z862 Personal history of diseases of the blood and blood-forming organs and certain disorders involving the immune mechanism: Secondary | ICD-10-CM | POA: Insufficient documentation

## 2015-11-05 DIAGNOSIS — R5383 Other fatigue: Secondary | ICD-10-CM | POA: Diagnosis present

## 2015-11-05 DIAGNOSIS — R011 Cardiac murmur, unspecified: Secondary | ICD-10-CM | POA: Insufficient documentation

## 2015-11-05 DIAGNOSIS — E039 Hypothyroidism, unspecified: Secondary | ICD-10-CM | POA: Insufficient documentation

## 2015-11-05 DIAGNOSIS — R531 Weakness: Secondary | ICD-10-CM | POA: Diagnosis not present

## 2015-11-05 DIAGNOSIS — Z7984 Long term (current) use of oral hypoglycemic drugs: Secondary | ICD-10-CM | POA: Diagnosis not present

## 2015-11-05 DIAGNOSIS — R000 Tachycardia, unspecified: Secondary | ICD-10-CM | POA: Diagnosis not present

## 2015-11-05 DIAGNOSIS — R52 Pain, unspecified: Secondary | ICD-10-CM | POA: Diagnosis not present

## 2015-11-05 DIAGNOSIS — M542 Cervicalgia: Secondary | ICD-10-CM | POA: Diagnosis not present

## 2015-11-05 DIAGNOSIS — E669 Obesity, unspecified: Secondary | ICD-10-CM | POA: Insufficient documentation

## 2015-11-05 DIAGNOSIS — R739 Hyperglycemia, unspecified: Secondary | ICD-10-CM

## 2015-11-05 DIAGNOSIS — I10 Essential (primary) hypertension: Secondary | ICD-10-CM | POA: Insufficient documentation

## 2015-11-05 MED ORDER — SODIUM CHLORIDE 0.9 % IV BOLUS (SEPSIS)
1000.0000 mL | Freq: Once | INTRAVENOUS | Status: AC
  Administered 2015-11-05: 1000 mL via INTRAVENOUS

## 2015-11-05 MED ORDER — SODIUM CHLORIDE 0.9 % IV SOLN
INTRAVENOUS | Status: DC
  Administered 2015-11-06: 01:00:00 via INTRAVENOUS

## 2015-11-05 MED ORDER — FENTANYL CITRATE (PF) 100 MCG/2ML IJ SOLN
50.0000 ug | Freq: Once | INTRAMUSCULAR | Status: AC
  Administered 2015-11-05: 50 ug via INTRAVENOUS
  Filled 2015-11-05: qty 2

## 2015-11-05 MED ORDER — ONDANSETRON HCL 4 MG/2ML IJ SOLN
4.0000 mg | Freq: Once | INTRAMUSCULAR | Status: AC
  Administered 2015-11-05: 4 mg via INTRAVENOUS
  Filled 2015-11-05: qty 2

## 2015-11-05 NOTE — ED Notes (Signed)
Pt arrived via [facility] from home c/o fatique and upper back pain.  Pt has afib on monitor, pt took flecainide.

## 2015-11-05 NOTE — ED Provider Notes (Addendum)
By signing my name below, I, Randa Evens, attest that this documentation has been prepared under the direction and in the presence of [employer], DO. Electronically Signed: Randa Evens, ED Scribe. 11/05/2015. 11:21 PM.   TIME SEEN: 11:20 PM  CHIEF COMPLAINT: Back pain   HPI: HPI Comments: Emily [name] is a 65 y.o. femalewith PMHX DM, HTN, PAF on Eliquis, carotid artery disease brought in by ambulance, who presents to the Emergency Department complaining of upper back pain between her shoulder blades onset tonight at 9 PM. Pt reports that she feels as if she is in A-Fib. Pt does report generalized weakness. Pt reports that she slight feel SOB due to the pain. Pt does report some chills the past 2 days as well. States that she felt like she may be coming down with a cold. No cough, vomiting or diarrhea. Pt states she has tried an extra flecainide with no relief. Pt states that she has been compliant with all of her medications including Eliquis, diltiazem and flecainide. Pt states that she has had to be admitted once with IV diltiazem. She has never been cardioverted.  ROS: See HPI Constitutional: no fever  Eyes: no drainage  ENT: no runny nose   Cardiovascular:  no chest pain  Resp: SOB  GI: no vomiting GU: no dysuria Integumentary: no rash  Allergy: no hives  Musculoskeletal: no leg swelling  Neurological: no slurred speech ROS otherwise negative  PAST MEDICAL HISTORY/PAST SURGICAL HISTORY:  Past Medical History  Diagnosis Date  . Diabetes mellitus without complication ([place])   . Hypertension   . Hypothyroid   . Arthritis   . PAF (paroxysmal atrial fibrillation) ([place])     a. newly dx in 09/2013; on eliquis  . Bilateral carotid artery disease ([place])   . Obesity   . Obstructive sleep apnea     on C Pap  . Dysrhythmia   . Heart murmur   . Difficult intubation     states 'lady that did the sleep study told me I have the smallest airway she has ever seen in an adult"   . Anemia     MEDICATIONS:  Prior to Admission medications   Medication Sig Start Date End Date Taking? Authorizing Provider  apixaban (ELIQUIS) 5 MG TABS tablet Take 1 tablet (5 mg total) by mouth 2 (two) times daily. 10/22/15   Minus Breeding, MD  diltiazem (CARDIZEM LA) 180 MG 24 hr tablet Take 1 tablet (180 mg total) by mouth daily. **Please disregard the rx that was sent in for #15. Thanks** 02/06/15   Minus Breeding, MD  flecainide (TAMBOCOR) 150 MG tablet Take 2 tablets (300 mg total) by mouth as needed (As needed for atrial fibrillation). Patient taking differently: Take 300 mg by mouth daily as needed (As needed for atrial fibrillation).  02/01/15   Minus Breeding, MD  furosemide (LASIX) 20 MG tablet Take 20 mg by mouth daily as needed for fluid.  10/02/14 10/02/15  Historical Provider, MD  glipiZIDE (GLUCOTROL) 5 MG tablet Take by mouth 2 (two) times daily before a meal.  03/26/15 03/25/16  Historical Provider, MD  hydrochlorothiazide (HYDRODIURIL) 25 MG tablet Take 25 mg by mouth daily. 08/02/13   Historical Provider, MD  ibuprofen (ADVIL,MOTRIN) 200 MG tablet Take 600 mg by mouth daily as needed for moderate pain.    Historical Provider, MD  levothyroxine (SYNTHROID, LEVOTHROID) 112 MCG tablet Take 56-168 mcg by mouth See admin instructions. TAKES ONE TABLET DAILY. TAKES ONE-HALF TABLET IN ADDITION,  EVERY OTHER DAY ONLY    Historical Provider, MD  losartan (COZAAR) 50 MG tablet Take 50 mg by mouth daily. 10/24/13   Historical Provider, MD  lovastatin (MEVACOR) 20 MG tablet Take 20 mg by mouth every evening.  08/12/13   Historical Provider, MD  metFORMIN (GLUCOPHAGE) 500 MG tablet Take 500 mg by mouth 2 (two) times daily. 09/26/13   Historical Provider, MD  metoprolol succinate (TOPROL-XL) 100 MG 24 hr tablet Take 100 mg by mouth daily. 08/02/13   Historical Provider, MD  oxyCODONE-acetaminophen (PERCOCET/ROXICET) 5-325 MG tablet Take 1 tablet by mouth every 4 (four) hours as needed. 08/30/15   Ulyses Amor, PA-C    ALLERGIES:  Allergies  Allergen Reactions  . Quinapril Hcl Cough  . Statins Other (See Comments)    PAIN IN LEGS  . Tape Other (See Comments)    Redness, please use "paper" tape    SOCIAL HISTORY:  Social History  Substance Use Topics  . Smoking status: Never Smoker   . Smokeless tobacco: Never Used  . Alcohol Use: No    FAMILY HISTORY: Family History  Problem Relation Age of Onset  . COPD Father   . Heart failure Father   . Heart disease Father   . Arrhythmia Sister   . Arrhythmia Sister   . Arrhythmia Sister     had PPM also  . Cancer Sister     EXAM: BP 96/71 mmHg  Pulse 49  Resp 17  SpO2 99%   CONSTITUTIONAL: Alert and oriented and responds appropriately to questions. Elderly; appears uncomfortable; obese, afebrile HEAD: Normocephalic EYES: Conjunctivae clear, PERRL ENT: normal nose; no rhinorrhea; moist mucous membranes NECK: Supple, no meningismus, no LAD  CARD: irregularly  irregular and tachycardic; S1 and S2 appreciated; no murmurs, no clicks, no rubs, no gallops RESP: Normal chest excursion without splinting or tachypnea; breath sounds clear and equal bilaterally; no wheezes, no rhonchi, no rales, no hypoxia or respiratory distress, speaking full sentences ABD/GI: Normal bowel sounds; non-distended; soft, non-tender, no rebound, no guarding, no peritoneal signs BACK:  The back appears normal and is non-tender to palpation, there is no CVA tenderness EXT: Normal ROM in all joints; non-tender to palpation; no edema; normal capillary refill; no cyanosis, no calf tenderness or swelling    SKIN: Normal color for age and race; warm; no rash NEURO: Moves all extremities equally, sensation to light touch intact diffusely, cranial nerves II through XII intact PSYCH: The patient's mood and manner are appropriate. Grooming and personal hygiene are appropriate.  MEDICAL DECISION MAKING: Patient here with atrial fibrillation with RVR. Complains of  pain between her shoulder blades, shortness of breath. Is mildly diaphoretic. EKG shows diffuse ST depression consistent with right related changes. Have had lengthy discussion with patient and her family about diltiazem versus cardioversion. They have asked me to consult cardiology on call. Discussed with Dr. Radford Pax who agrees that patient would be a good candidate for cardioversion.  She reports compliance with her outlook with and has not missed any doses in the past 30 days. Patient agrees to cardioversion in the emergency department.  ED PROGRESS: Patient's labs unremarkable other than hyperglycemia. She is receiving IV fluids. No DKA. Potassium, magnesium normal. Troponin negative.  Patient has been consented for cardioversion.  Patient cardioverted at 200 J after being sedated with propofol. She went back into normal sinus rhythm. She reports her symptoms have completely resolved. We'll continue to monitor patient closely and repeat a second troponin.  Patient continues to stay normal sinus rhythm is hemodynamically stable, asymptomatic. Second troponin is negative. Blood glucose has improved with IV hydration.    7:20 AM  Patient's TSH is elevated. Will add on free T3, free T4. Have advised follow-up with her PCP for this.  She is on Synthroid and reports compliance. She is still hemodynamically stable and awaiting her family to come to the emergency department to pick her up. We'll have her follow-up with her cardiologist as an outpatient.    EKG Interpretation  Date/Time:  Tuesday November 05 2015 23:00:04 EDT Ventricular Rate:  149 PR Interval:  196 QRS Duration: 99 QT Interval:  304 QTC Calculation: 479 R Axis:   -3 Text Interpretation:  Atrial fibrillation with RVR Atrial premature complexes LVH with secondary repolarization abnormality Anterior Q waves, possibly due to LVH ST depression, probably rate related Confirmed by Roddie Riegler,  DO, Lyrika Souders [phone]) on 11/05/2015 11:11:45 PM         EKG Interpretation  Date/Time:  Wednesday November 06 2015 01:10:48 EDT Ventricular Rate:  82 PR Interval:  182 QRS Duration: 112 QT Interval:  415 QTC Calculation: 485 R Axis:   -8 Text Interpretation:  Sinus rhythm Probable left ventricular hypertrophy Atrial fibrillation no longer present Confirmed by Tamrah Victorino,  DO, Otha Monical [id number]) on 11/06/2015 1:19:16 AM           Procedural sedation Performed by: Nyra Jabs Consent: Verbal consent obtained. Risks and benefits: risks, benefits and alternatives were discussed Required items: required blood products, implants, devices, and special equipment available Patient identity confirmed: arm band and provided demographic data Time out: Immediately prior to procedure a "time out" was called to verify the correct patient, procedure, equipment, support staff and site/side marked as required.  Sedation type: moderate (conscious) sedation NPO time confirmed and considedered  Sedatives: PROPOFOL  Physician Time at Bedside: 15 minutes  Vitals: Vital signs were monitored during sedation. Cardiac Monitor, pulse oximeter Patient tolerance: Patient tolerated the procedure well with no immediate complications. Comments: Pt with uneventful recovered. Returned to pre-procedural sedation baseline      .Cardioversion Date/Time: 11/06/2015 1:06 AM Performed by: Nyra Jabs Authorized by: Nyra Jabs Consent: Verbal consent obtained. Written consent obtained. Risks and benefits: risks, benefits and alternatives were discussed Consent given by: patient Patient understanding: patient states understanding of the procedure being performed Patient consent: the patient's understanding of the procedure matches consent given Relevant documents: relevant documents present and verified Imaging studies: imaging studies available Required items: required blood products, implants, devices, and special equipment available Patient identity confirmed:  arm band and verbally with patient Time out: Immediately prior to procedure a "time out" was called to verify the correct patient, procedure, equipment, support staff and site/side marked as required. Patient sedated: yes Sedation type: moderate (conscious) sedation Sedatives: propofol Analgesia: fentanyl Sedation start date/time: 11/06/2015 1:06 AM Sedation end time 11/06/2015 1:22 AM Cardioversion basis: elective Indications: failure of anti-arrhythmic medications Pre-procedure rhythm: atrial fibrillation Patient position: patient was placed in a supine position Chest area: chest area exposed Electrodes: pads Electrodes placed: anterior-posterior Number of attempts: 1 Attempt 1 mode: synchronous Attempt 1 shock (in Joules): 200 Attempt 1 outcome: conversion to normal sinus rhythm Post-procedure rhythm: normal sinus rhythm Complications: no complications Patient tolerance: Patient tolerated the procedure well with no immediate complications     CRITICAL CARE Performed by: Nyra Jabs   Total critical care time: 60 minutes  Critical care time was exclusive of separately billable procedures and  treating other patients.  Critical care was necessary to treat or prevent imminent or life-threatening deterioration.  Critical care was time spent personally by me on the following activities: development of treatment plan with patient and/or surrogate as well as nursing, discussions with consultants, evaluation of patient's response to treatment, examination of patient, obtaining history from patient or surrogate, ordering and performing treatments and interventions, ordering and review of laboratory studies, ordering and review of radiographic studies, pulse oximetry and re-evaluation of patient's condition.    I personally performed the services described in this documentation, which was scribed in my presence. The recorded information has been reviewed and is  accurate.    [place], DO 11/06/15 0723    CHADS-Vasc = Finger, DO 11/07/15 [id number]

## 2015-11-06 DIAGNOSIS — R000 Tachycardia, unspecified: Secondary | ICD-10-CM | POA: Diagnosis not present

## 2015-11-06 DIAGNOSIS — I4891 Unspecified atrial fibrillation: Secondary | ICD-10-CM | POA: Diagnosis not present

## 2015-11-06 LAB — CBC WITH DIFFERENTIAL/PLATELET
BASOS ABS: 0 10*3/uL (ref 0.0–0.1)
BASOS PCT: 0 %
Eosinophils Absolute: 0.1 10*3/uL (ref 0.0–0.7)
Eosinophils Relative: 1 %
HEMATOCRIT: 32.5 % — AB (ref 36.0–46.0)
Hemoglobin: 9.7 g/dL — ABNORMAL LOW (ref 12.0–15.0)
LYMPHS PCT: 7 %
Lymphs Abs: 0.6 10*3/uL — ABNORMAL LOW (ref 0.7–4.0)
MCH: 24.7 pg — ABNORMAL LOW (ref 26.0–34.0)
MCHC: 29.8 g/dL — ABNORMAL LOW (ref 30.0–36.0)
MCV: 82.9 fL (ref 78.0–100.0)
MONO ABS: 0.6 10*3/uL (ref 0.1–1.0)
Monocytes Relative: 6 %
NEUTROS ABS: 8.1 10*3/uL — AB (ref 1.7–7.7)
NEUTROS PCT: 86 %
Platelets: 219 10*3/uL (ref 150–400)
RBC: 3.92 MIL/uL (ref 3.87–5.11)
RDW: 16 % — AB (ref 11.5–15.5)
WBC: 9.4 10*3/uL (ref 4.0–10.5)

## 2015-11-06 LAB — I-STAT TROPONIN, ED
Troponin i, poc: 0 ng/mL (ref 0.00–0.08)
Troponin i, poc: 0.03 ng/mL (ref 0.00–0.08)

## 2015-11-06 LAB — T4, FREE: Free T4: 1.02 ng/dL (ref 0.61–1.12)

## 2015-11-06 LAB — BASIC METABOLIC PANEL
ANION GAP: 11 (ref 5–15)
BUN: 14 mg/dL (ref 6–20)
CALCIUM: 9 mg/dL (ref 8.9–10.3)
CO2: 23 mmol/L (ref 22–32)
Chloride: 99 mmol/L — ABNORMAL LOW (ref 101–111)
Creatinine, Ser: 0.63 mg/dL (ref 0.44–1.00)
Glucose, Bld: 423 mg/dL — ABNORMAL HIGH (ref 65–99)
POTASSIUM: 4 mmol/L (ref 3.5–5.1)
Sodium: 133 mmol/L — ABNORMAL LOW (ref 135–145)

## 2015-11-06 LAB — CBG MONITORING, ED: Glucose-Capillary: 385 mg/dL — ABNORMAL HIGH (ref 65–99)

## 2015-11-06 LAB — TSH: TSH: 6.913 u[IU]/mL — AB (ref 0.350–4.500)

## 2015-11-06 LAB — MAGNESIUM: MAGNESIUM: 2 mg/dL (ref 1.7–2.4)

## 2015-11-06 MED ORDER — PROPOFOL 10 MG/ML IV BOLUS
INTRAVENOUS | Status: AC
  Administered 2015-11-06: 60 mg
  Filled 2015-11-06: qty 20

## 2015-11-06 MED ORDER — PROPOFOL 10 MG/ML IV BOLUS
100.0000 mg | Freq: Once | INTRAVENOUS | Status: AC
  Administered 2015-11-06: 20 mg via INTRAVENOUS
  Filled 2015-11-06: qty 20

## 2015-11-06 NOTE — ED Notes (Signed)
Propofol, crash-cart at bedside.

## 2015-11-06 NOTE — Discharge Instructions (Signed)
Atrial Fibrillation °Atrial fibrillation is a type of irregular or rapid heartbeat (arrhythmia). In atrial fibrillation, the heart quivers continuously in a chaotic pattern. This occurs when parts of the heart receive disorganized signals that make the heart unable to pump blood normally. This can increase the risk for stroke, heart failure, and other heart-related conditions. There are different types of atrial fibrillation, including: °· Paroxysmal atrial fibrillation. This type starts suddenly, and it usually stops on its own shortly after it starts. °· Persistent atrial fibrillation. This type often lasts longer than a week. It may stop on its own or with treatment. °· Long-lasting persistent atrial fibrillation. This type lasts longer than 12 months. °· Permanent atrial fibrillation. This type does not go away. °Talk with your health care provider to learn about the type of atrial fibrillation that you have. °CAUSES °This condition is caused by some heart-related conditions or procedures, including: °· A heart attack. °· Coronary artery disease. °· Heart failure. °· Heart valve conditions. °· High blood pressure. °· Inflammation of the sac that surrounds the heart (pericarditis). °· Heart surgery. °· Certain heart rhythm disorders, such as Wolf-Parkinson-White syndrome. °Other causes include: °· Pneumonia. °· Obstructive sleep apnea. °· Blockage of an artery in the lungs (pulmonary embolism, or PE). °· Lung cancer. °· Chronic lung disease. °· Thyroid problems, especially if the thyroid is overactive (hyperthyroidism). °· Caffeine. °· Excessive alcohol use or illegal drug use. °· Use of some medicines, including certain decongestants and diet pills. °Sometimes, the cause cannot be found. °RISK FACTORS °This condition is more likely to develop in: °· People who are older in age. °· People who smoke. °· People who have diabetes mellitus. °· People who are overweight (obese). °· Athletes who exercise  vigorously. °SYMPTOMS °Symptoms of this condition include: °· A feeling that your heart is beating rapidly or irregularly. °· A feeling of discomfort or pain in your chest. °· Shortness of breath. °· Sudden light-headedness or weakness. °· Getting tired easily during exercise. °In some cases, there are no symptoms. °DIAGNOSIS °Your health care provider may be able to detect atrial fibrillation when taking your pulse. If detected, this condition may be diagnosed with: °· An electrocardiogram (ECG). °· A Holter monitor test that records your heartbeat patterns over a 24-hour period. °· Transthoracic echocardiogram (TTE) to evaluate how blood flows through your heart. °· Transesophageal echocardiogram (TEE) to view more detailed images of your heart. °· A stress test. °· Imaging tests, such as a CT scan or chest X-ray. °· Blood tests. °TREATMENT °The main goals of treatment are to prevent blood clots from forming and to keep your heart beating at a normal rate and rhythm. The type of treatment that you receive depends on many factors, such as your underlying medical conditions and how you feel when you are experiencing atrial fibrillation. °This condition may be treated with: °· Medicine to slow down the heart rate, bring the heart's rhythm back to normal, or prevent clots from forming. °· Electrical cardioversion. This is a procedure that resets your heart's rhythm by delivering a controlled, low-energy shock to the heart through your skin. °· Different types of ablation, such as catheter ablation, catheter ablation with pacemaker, or surgical ablation. These procedures destroy the heart tissues that send abnormal signals. When the pacemaker is used, it is placed under your skin to help your heart beat in a regular rhythm. °HOME CARE INSTRUCTIONS °· Take over-the counter and prescription medicines only as told by your health care provider. °·   If your health care provider prescribed a blood-thinning medicine  (anticoagulant), take it exactly as told. Taking too much blood-thinning medicine can cause bleeding. If you do not take enough blood-thinning medicine, you will not have the protection that you need against stroke and other problems.  Do not use tobacco products, including cigarettes, chewing tobacco, and e-cigarettes. If you need help quitting, ask your health care provider.  If you have obstructive sleep apnea, manage your condition as told by your health care provider.  Do not drink alcohol.  Do not drink beverages that contain caffeine, such as coffee, soda, and tea.  Maintain a healthy weight. Do not use diet pills unless your health care provider approves. Diet pills may make heart problems worse.  Follow diet instructions as told by your health care provider.  Exercise regularly as told by your health care provider.  Keep all follow-up visits as told by your health care provider. This is important. PREVENTION  Avoid drinking beverages that contain caffeine or alcohol.  Avoid certain medicines, especially medicines that are used for breathing problems.  Avoid certain herbs and herbal medicines, such as those that contain ephedra or ginseng.  Do not use illegal drugs, such as cocaine and amphetamines.  Do not smoke.  Manage your high blood pressure. SEEK MEDICAL CARE IF:  You notice a change in the rate, rhythm, or strength of your heartbeat.  You are taking an anticoagulant and you notice increased bruising.  You tire more easily when you exercise or exert yourself. SEEK IMMEDIATE MEDICAL CARE IF:  You have chest pain, abdominal pain, sweating, or weakness.  You feel nauseous.  You notice blood in your vomit, bowel movement, or urine.  You have shortness of breath.  You suddenly have swollen feet and ankles.  You feel dizzy.  You have sudden weakness or numbness of the face, arm, or leg, especially on one side of the body.  You have trouble speaking,  trouble understanding, or both (aphasia).  Your face or your eyelid droops on one side. These symptoms may represent a serious problem that is an emergency. Do not wait to see if the symptoms will go away. Get medical help right away. Call your local emergency services (911 in the [place] Do not drive yourself to the hospital.   This information is not intended to replace advice given to you by your health care provider. Make sure you discuss any questions you have with your health care provider.   Document Released: 05/18/2005 Document Revised: 02/06/2015 Document Reviewed: 09/12/2014 Elsevier Interactive Patient Education 2016 [employer]  Blood Glucose Monitoring, Adult Monitoring your blood glucose (also know as blood sugar) helps you to manage your diabetes. It also helps you and your health care provider monitor your diabetes and determine how well your treatment plan is working. WHY SHOULD YOU MONITOR YOUR BLOOD GLUCOSE?  It can help you understand how food, exercise, and medicine affect your blood glucose.  It allows you to know what your blood glucose is at any given moment. You can quickly tell if you are having low blood glucose (hypoglycemia) or high blood glucose (hyperglycemia).  It can help you and your health care provider know how to adjust your medicines.  It can help you understand how to manage an illness or adjust medicine for exercise. WHEN SHOULD YOU TEST? Your health care provider will help you decide how often you should check your blood glucose. This may depend on the type of diabetes you have, your  diabetes control, or the types of medicines you are taking. Be sure to write down all of your blood glucose readings so that this information can be reviewed with your health care provider. See below for examples of testing times that your health care provider may suggest. Type 1 Diabetes  Test at least 2 times per day if your diabetes is well controlled, if you are  using an insulin pump, or if you perform multiple daily injections.  If your diabetes is not well controlled or if you are sick, you may need to test more often.  It is a good idea to also test:  Before every insulin injection.  Before and after exercise.  Between meals and 2 hours after a meal.  Occasionally between 2:00 a.m. and 3:00 a.m. Type 2 Diabetes  If you are taking insulin, test at least 2 times per day. However, it is best to test before every insulin injection.  If you take medicines by mouth (orally), test 2 times a day.  If you are on a controlled diet, test once a day.  If your diabetes is not well controlled or if you are sick, you may need to monitor more often. HOW TO MONITOR YOUR BLOOD GLUCOSE Supplies Needed  Blood glucose meter.  Test strips for your meter. Each meter has its own strips. You must use the strips that go with your own meter.  A pricking needle (lancet).  A device that holds the lancet (lancing device).  A journal or log book to write down your results. Procedure  Wash your hands with soap and water. Alcohol is not preferred.  Prick the side of your finger (not the tip) with the lancet.  Gently milk the finger until a small drop of blood appears.  Follow the instructions that come with your meter for inserting the test strip, applying blood to the strip, and using your blood glucose meter. Other Areas to Get Blood for Testing Some meters allow you to use other areas of your body (other than your finger) to test your blood. These areas are called alternative sites. The most common alternative sites are:  The forearm.  The thigh.  The back area of the lower leg.  The palm of the hand. The blood flow in these areas is slower. Therefore, the blood glucose values you get may be delayed, and the numbers are different from what you would get from your fingers. Do not use alternative sites if you think you are having hypoglycemia. Your  reading will not be accurate. Always use a finger if you are having hypoglycemia. Also, if you cannot feel your lows (hypoglycemia unawareness), always use your fingers for your blood glucose checks. ADDITIONAL TIPS FOR GLUCOSE MONITORING  Do not reuse lancets.  Always carry your supplies with you.  All blood glucose meters have a 24-hour "hotline" number to call if you have questions or need help.  Adjust (calibrate) your blood glucose meter with a control solution after finishing a few boxes of strips. BLOOD GLUCOSE RECORD KEEPING It is a good idea to keep a daily record or log of your blood glucose readings. Most glucose meters, if not all, keep your glucose records stored in the meter. Some meters come with the ability to download your records to your home computer. Keeping a record of your blood glucose readings is especially helpful if you are wanting to look for patterns. Make notes to go along with the blood glucose readings because you might forget  what happened at that exact time. Keeping good records helps you and your health care provider to work together to achieve good diabetes management.    This information is not intended to replace advice given to you by your health care provider. Make sure you discuss any questions you have with your health care provider.   Document Released: 05/21/2003 Document Revised: 06/08/2014 Document Reviewed: 10/10/2012 Elsevier Interactive Patient Education 2016 [place].  Hyperglycemia Hyperglycemia occurs when the glucose (sugar) in your blood is too high. Hyperglycemia can happen for many reasons, but it most often happens to people who do not know they have diabetes or are not managing their diabetes properly.  CAUSES  Whether you have diabetes or not, there are other causes of hyperglycemia. Hyperglycemia can occur when you have diabetes, but it can also occur in other situations that you might not be as aware of, such as: Diabetes  If  you have diabetes and are having problems controlling your blood glucose, hyperglycemia could occur because of some of the following reasons:  Not following your meal plan.  Not taking your diabetes medications or not taking it properly.  Exercising less or doing less activity than you normally do.  Being sick. Pre-diabetes  This cannot be ignored. Before people develop Type 2 diabetes, they almost always have "pre-diabetes." This is when your blood glucose levels are higher than normal, but not yet high enough to be diagnosed as diabetes. Research has shown that some long-term damage to the body, especially the heart and circulatory system, may already be occurring during pre-diabetes. If you take action to manage your blood glucose when you have pre-diabetes, you may delay or prevent Type 2 diabetes from developing. Stress  If you have diabetes, you may be "diet" controlled or on oral medications or insulin to control your diabetes. However, you may find that your blood glucose is higher than usual in the hospital whether you have diabetes or not. This is often referred to as "stress hyperglycemia." Stress can elevate your blood glucose. This happens because of hormones put out by the body during times of stress. If stress has been the cause of your high blood glucose, it can be followed regularly by your caregiver. That way he/she can make sure your hyperglycemia does not continue to get worse or progress to diabetes. Steroids  Steroids are medications that act on the infection fighting system (immune system) to block inflammation or infection. One side effect can be a rise in blood glucose. Most people can produce enough extra insulin to allow for this rise, but for those who cannot, steroids make blood glucose levels go even higher. It is not unusual for steroid treatments to "uncover" diabetes that is developing. It is not always possible to determine if the hyperglycemia will go away after  the steroids are stopped. A special blood test called an A1c is sometimes done to determine if your blood glucose was elevated before the steroids were started. SYMPTOMS  Thirsty.  Frequent urination.  Dry mouth.  Blurred vision.  Tired or fatigue.  Weakness.  Sleepy.  Tingling in feet or leg. DIAGNOSIS  Diagnosis is made by monitoring blood glucose in one or all of the following ways:  A1c test. This is a chemical found in your blood.  Fingerstick blood glucose monitoring.  Laboratory results. TREATMENT  First, knowing the cause of the hyperglycemia is important before the hyperglycemia can be treated. Treatment may include, but is not be limited to:  Education.  Change or adjustment in medications.  Change or adjustment in meal plan.  Treatment for an illness, infection, etc.  More frequent blood glucose monitoring.  Change in exercise plan.  Decreasing or stopping steroids.  Lifestyle changes. HOME CARE INSTRUCTIONS   Test your blood glucose as directed.  Exercise regularly. Your caregiver will give you instructions about exercise. Pre-diabetes or diabetes which comes on with stress is helped by exercising.  Eat wholesome, balanced meals. Eat often and at regular, fixed times. Your caregiver or nutritionist will give you a meal plan to guide your sugar intake.  Being at an ideal weight is important. If needed, losing as little as 10 to 15 pounds may help improve blood glucose levels. SEEK MEDICAL CARE IF:   You have questions about medicine, activity, or diet.  You continue to have symptoms (problems such as increased thirst, urination, or weight gain). SEEK IMMEDIATE MEDICAL CARE IF:   You are vomiting or have diarrhea.  Your breath smells fruity.  You are breathing faster or slower.  You are very sleepy or incoherent.  You have numbness, tingling, or pain in your feet or hands.  You have chest pain.  Your symptoms get worse even though you  have been following your caregiver's orders.  If you have any other questions or concerns.   This information is not intended to replace advice given to you by your health care provider. Make sure you discuss any questions you have with your health care provider.   Document Released: 11/11/2000 Document Revised: 08/10/2011 Document Reviewed: 01/22/2015 Elsevier Interactive Patient Education [employer].

## 2015-11-06 NOTE — ED Notes (Signed)
[facility]G 385; RN notified 

## 2015-11-07 LAB — T3, FREE: T3, Free: 2.4 pg/mL (ref 2.0–4.4)

## 2015-11-11 ENCOUNTER — Telehealth: Payer: Self-pay | Admitting: Cardiology

## 2015-11-11 NOTE — Progress Notes (Signed)
Cardiology Office Note    Date:  11/12/2015   ID:  Emily [name], DOB 01/28/1951, MRN [id number]  PCP:  Emily Chafe, MD  Cardiologist:  Dr. Percival [name] Dr. Gwenlyn [name] (PV)  CC: Recurrent palpitations   History of Present Illness:  Emily [name] is a 65 y.o. female with a history of PAF on PRN flecainide and Eliquis, OSA on CPAP, HTN, bilateral carotid artery disease s/p L CEA (07/2015), DMT2, and hypothyroidism who presents to clinic for evaluation of palpitations.  She was last seen by Dr. Percival [name] in 06/2015 for follow up. He mentioned that if she needed flecainide more frequently we may need to change therapy. Bruits were noted and carotid dopplers were ordered. This showed high-grade left internal carotid artery stenosis and moderate right ICA stenosis. She was referred to Dr. Gwenlyn [name]. He felt that she would be an excellent candidate for CEA. A 2D ECHO and myovew was ordered for further risk stratification. 2D ECHO (07/17/15) showed normal LV function, mod LVH, G1DD, mild AS. Myoview (07/17/15) was low risk but hypertensive at rest and severe hypertension with stress.   She underwent successful L CEA with bovine pericardial patch angioplasty on 08/30/2015 by Dr. Trula [name].   She was seen in th ER on 09/03/15 for afib. She called EMS after pill in the pocket flecainide didn't work, but she converted while en route to ER after given diltiazem.   She was seen in the ER on 11/05/15 for pain in between her shoulder blades, SOB and diaphoresis. She was in afib with RVR and underwent DCCV. ECG with some ST depression which was felt to be rate related. She was discharged after successful DCCV.   Since that time she has had a few more episodes which flecainide and vagal maneuvers has terminated.  When she goes into afib she can feel a flutter in her chest and neck. It then continues into her back . She feels diaphoretic, SOB and weak. Kirk Ruths she had a good day and is feeling better today. She just  cannot tolerate going into afib. She has had some coughing and congestion and is being treated empirically for PNA. No chest pain. She sometimes gets LE edema when on her feet all day. No orthopnea or PND. No dizziness or syncope. Wears her CPAP every night.     Past Medical History  Diagnosis Date  . Diabetes mellitus without complication ([place])   . Hypertension   . Hypothyroid   . Arthritis   . PAF (paroxysmal atrial fibrillation) ([place])     a. newly dx in 09/2013; on eliquis  . Bilateral carotid artery disease ([place])   . Obesity   . Obstructive sleep apnea     on C Pap  . Dysrhythmia   . Heart murmur   . Difficult intubation     states 'lady that did the sleep study told me I have the smallest airway she has ever seen in an adult"  . Anemia     Past Surgical History  Procedure Laterality Date  . Tonsillectomy    . Dilation and curettage of uterus      x 5  . Cyst on back of neck removed    . Colonoscopy w/ polypectomy    . Eye surgery    . Cataract extraction Left   . Endarterectomy Left 08/30/2015    Procedure: LEFT CAROYID ENDARTERECTOMY WITH XENOSURE BOVINE PERICARDIUM PATCH ANGIOPLASTY;  Surgeon: Emily Mitchell, MD;  Location: [facility];  Service: Vascular;  Laterality: Left;  Current Medications: Outpatient Prescriptions Prior to Visit  Medication Sig Dispense Refill  . amoxicillin-clavulanate (AUGMENTIN) 875-125 MG tablet Take 1 tablet by mouth 2 (two) times daily. 20 tablet 0  . apixaban (ELIQUIS) 5 MG TABS tablet Take 1 tablet (5 mg total) by mouth 2 (two) times daily. 60 tablet 5  . benzonatate (TESSALON) 100 MG capsule Take 1 capsule (100 mg total) by mouth 2 (two) times daily as needed for cough. 20 capsule 0  . diltiazem (CARDIZEM LA) 180 MG 24 hr tablet Take 1 tablet (180 mg total) by mouth daily. **Please disregard the rx that was sent in for #15. Thanks** 90 tablet 1  . furosemide (LASIX) 20 MG tablet Take 20 mg by mouth daily as needed for fluid.     Marland Kitchen  glipiZIDE (GLUCOTROL) 5 MG tablet Take 5 mg by mouth 2 (two) times daily before a meal.     . hydrochlorothiazide (HYDRODIURIL) 25 MG tablet Take 25 mg by mouth daily.    Marland Kitchen ibuprofen (ADVIL,MOTRIN) 200 MG tablet Take 600 mg by mouth daily as needed for moderate pain.    Marland Kitchen levothyroxine (SYNTHROID, LEVOTHROID) 112 MCG tablet Take 56-168 mcg by mouth See admin instructions. TAKES ONE TABLET DAILY. TAKES ONE-HALF TABLET IN ADDITION, EVERY OTHER DAY ONLY    . losartan (COZAAR) 50 MG tablet Take 50 mg by mouth daily.    Marland Kitchen lovastatin (MEVACOR) 20 MG tablet Take 20 mg by mouth every evening.     . metFORMIN (GLUCOPHAGE) 500 MG tablet Take 500 mg by mouth 2 (two) times daily.    . metoprolol succinate (TOPROL-XL) 100 MG 24 hr tablet Take 100 mg by mouth daily.    . flecainide (TAMBOCOR) 150 MG tablet Take 2 tablets (300 mg total) by mouth as needed (As needed for atrial fibrillation). (Patient taking differently: Take 300 mg by mouth daily as needed (As needed for atrial fibrillation). ) 60 tablet 2   No facility-administered medications prior to visit.     Allergies:   Quinapril hcl; Statins; and Tape   Social History   Social History  . Marital Status: Single    Spouse Name: N/A  . Number of Children: N/A  . Years of Education: N/A   Social History Main Topics  . Smoking status: Never Smoker   . Smokeless tobacco: Never Used  . Alcohol Use: No  . Drug Use: No  . Sexual Activity: Not Asked   Other Topics Concern  . None   Social History Narrative     Family History:  The patient'sfamily history includes Arrhythmia in her sister, sister, and sister; COPD in her father; Cancer in her sister; Heart disease in her father; Heart failure in her father.   ROS:   Please see the history of present illness.    ROS All other systems reviewed and are negative.   PHYSICAL EXAM:   VS:  BP 150/80 mmHg  Pulse 62  Ht 5\' 3"  (1.6 m)  Wt 247 lb (112.038 kg)  BMI 43.76 kg/m2   GEN: Well  nourished, well developed, in no acute distress HEENT: normal Neck: no JVD, carotid bruits, or masses Cardiac: RRR; no murmurs, rubs, or gallops,no edema  Respiratory:  clear to auscultation bilaterally, normal work of breathing GI: soft, nontender, nondistended, + BS MS: no deformity or atrophy Skin: warm and dry, no rash Neuro:  Alert and Oriented x 3, Strength and sensation are intact Psych: euthymic mood, full affect  Wt Readings from Last 3  Encounters:  11/12/15 247 lb (112.038 kg)  11/12/15 247 lb 12.8 oz (112.401 kg)  09/16/15 246 lb (111.585 kg)      Studies/Labs Reviewed:   EKG:  EKG is ordered today.  The ekg ordered today demonstrates NSR HR 62  Recent Labs: 08/27/2015: ALT 27 11/06/2015: BUN 14; Creatinine, Ser 0.63; Hemoglobin 9.7*; Magnesium 2.0; Platelets 219; Potassium 4.0; Sodium 133*; TSH 6.913*   Lipid Panel No results [name] for: CHOL, TRIG, HDL, CHOLHDL, VLDL, LDLCALC, LDLDIRECT  Additional studies/ records that were reviewed today include:  2D ECHO: 07/17/2015 LV EF: 60% - 65 Study Conclusions - Left ventricle: The cavity size was normal. Wall thickness was  increased in a pattern of moderate LVH. Systolic function was  normal. The estimated ejection fraction was in the range of 60%  to 65%. Wall motion was normal; there were no regional wall  motion abnormalities. Doppler parameters are consistent with  abnormal left ventricular relaxation (grade 1 diastolic  dysfunction). - Aortic valve: Valve area (Vmax): 1.15 cm^2.  Myoview 07/2015 Study Highlights     The left ventricular ejection fraction is hyperdynamic (>65%).  Nuclear stress EF: 66%.  The study is normal.  This is a low risk study.  There was no ST segment deviation noted during stress.  Blood pressure demonstrated a hypertensive response to exercise.  Normal pharmacologic nuclear study with no evidence for infarct or ischemia. There was baseline hypertension with severe  hypertension at stress.       ASSESSMENT & PLAN:   PAF: with more frequent episodes of afib. On pill in the pocket Flecainide. Continue diltiazem LA 180mg  daily and Toprol XL 100mg  daily. I discussed with Dr Curt Bears (DOD) today and we reviewed her history. She had a recent normal nuclear stress test and ECHO with normal LV function. No hx of CAD or LV dysfunction. Will plan to change flecainide to scheduled 75 mg BID. Will have a GXT arranged in 7-10 days and then follow up in afib clinic with Orson Eva NP.  She has been told to call us if she goes back into afib on the scheduled flecainide.   -- CHADSVASC score of at least 5 (HTN, age, DM, 34 dz, F sex). Continue Eliquis 5mg  BID.  -- She has had two sisters with severe reactions to amiodarone and wanted it added her chart that she never be placed on this.   HTN: BP with moderate control on losartan 50mg  daily, Toprol XL 100mg  daily, HCTZ 25mg  daily, lasix  20mg  daily (? why on both) and Cardizem LA 180mg  daily.   Carotid artery disease: s/p left carotid endarterectomy 07/2015. The patient will follow-up in 9 months with a repeat carotid duplex. Followed by Dr. Trula [name]   DMT2: continue current regimen.   Hypothyroidism: continue synthroid. Recent TSH a little high. Follow with PCP.    Medication Adjustments/Labs and Tests Ordered: Current medicines are reviewed at length with the patient today.  Concerns regarding medicines are outlined above.  Medication changes, Labs and Tests ordered today are listed in the Patient Instructions below. Patient Instructions  Medication Instructions:  Your physician has recommended you make the following change in your medication:  1.  STOP the Flecainide as needed 2.  START the Flecainide 150 mg taking 1/2 tablet twice a day   Labwork: None ordered  Testing/Procedures: Your physician has requested that you have an exercise tolerance test. For further information please visit  [email or web address]. Please also follow instruction sheet, as given.  Follow-Up: Your physician recommends that you schedule a follow-up appointment in: 2 WEEKS IN THE A-FIB CLINIC   Any Other Special Instructions Will Be Listed Below (If Applicable). Exercise Stress Electrocardiogram An exercise stress electrocardiogram is a test to check how blood flows to your heart. It is done to find areas of poor blood flow. You will need to walk on a treadmill for this test. The electrocardiogram will record your heartbeat when you are at rest and when you are exercising. BEFORE THE PROCEDURE  Do not have drinks with caffeine or foods with caffeine for 24 hours before the test, or as told by your doctor. This includes coffee, tea (even decaf tea), sodas, chocolate, and cocoa.  Follow your doctor's instructions about eating and drinking before the test.  Ask your doctor what medicines you should or should not take before the test. Take your medicines with water unless told by your doctor not to.  If you use an inhaler, bring it with you to the test.  Bring a snack to eat after the test.  Do not  smoke for 4 hours before the test.  Do not put lotions, powders, creams, or oils on your chest before the test.  Wear comfortable shoes and clothing. PROCEDURE  You will have patches put on your chest. Small areas of your chest may need to be shaved. Wires will be connected to the patches.  Your heart rate will be watched while you are resting and while you are exercising.  You will walk on the treadmill. The treadmill will slowly get faster to raise your heart rate.  The test will take about 1-2 hours. AFTER THE PROCEDURE  Your heart rate and blood pressure will be watched after the test.  You may return to your normal diet, activities, and medicines or as told by your doctor.   This information is not intended to replace advice given to you by your health care provider. Make sure you  discuss any questions you have with your health care provider.   Document Released: 11/04/2007 Document Revised: 06/08/2014 Document Reviewed: 01/23/2013 Elsevier Interactive Patient Education [employer].    If you need a refill on your cardiac medications before your next appointment, please call your pharmacy.       Signed, Angelena Form, PA-C  11/12/2015 2:48 PM    [facility] [address], [place], [place]  [address] Phone: [phone]; Fax: [phone]

## 2015-11-11 NOTE — Telephone Encounter (Signed)
Pt calling regarding her heart being out of rhythm, went to ER 11-05-15, still having some trouble-Flecinide not seeming to help as usual- pls advise [phone]

## 2015-11-11 NOTE — Telephone Encounter (Signed)
Returned call to pt. She stated last Tuesday, 11/05/15 she went to ED and was cardioverted for being in a fib. Since she was discharged she has had two times where she felt she was in a fib.  The first time lasted about 20 min, felt like a "quivering" and she took her flecainide. The second time was in the middle of the night, lasted around 45 min, she took her flecainide at that time as well. She is concerned that the flecainide is not working and she usually does not have this many episodes so close together. She can feel it every time she goes into a fib. Appt scheduled on [facility]LEX for 11/12/15 at 1345.  Will forward to Dr Percival Spanish as well.

## 2015-11-12 ENCOUNTER — Ambulatory Visit (INDEPENDENT_AMBULATORY_CARE_PROVIDER_SITE_OTHER): Payer: Medicare Other | Admitting: Family Medicine

## 2015-11-12 ENCOUNTER — Encounter: Payer: Self-pay | Admitting: Family Medicine

## 2015-11-12 ENCOUNTER — Encounter: Payer: Self-pay | Admitting: Physician Assistant

## 2015-11-12 ENCOUNTER — Ambulatory Visit (INDEPENDENT_AMBULATORY_CARE_PROVIDER_SITE_OTHER): Payer: Medicare Other | Admitting: Physician Assistant

## 2015-11-12 VITALS — BP 150/80 | HR 62 | Ht 63.0 in | Wt 247.0 lb

## 2015-11-12 VITALS — BP 157/76 | HR 60 | Temp 97.6°F | Ht 63.0 in | Wt 247.8 lb

## 2015-11-12 DIAGNOSIS — I6523 Occlusion and stenosis of bilateral carotid arteries: Secondary | ICD-10-CM

## 2015-11-12 DIAGNOSIS — I4891 Unspecified atrial fibrillation: Secondary | ICD-10-CM

## 2015-11-12 DIAGNOSIS — J209 Acute bronchitis, unspecified: Secondary | ICD-10-CM | POA: Diagnosis not present

## 2015-11-12 DIAGNOSIS — I10 Essential (primary) hypertension: Secondary | ICD-10-CM | POA: Diagnosis not present

## 2015-11-12 DIAGNOSIS — Z79899 Other long term (current) drug therapy: Secondary | ICD-10-CM

## 2015-11-12 DIAGNOSIS — I779 Disorder of arteries and arterioles, unspecified: Secondary | ICD-10-CM

## 2015-11-12 DIAGNOSIS — I739 Peripheral vascular disease, unspecified: Secondary | ICD-10-CM

## 2015-11-12 DIAGNOSIS — Z5181 Encounter for therapeutic drug level monitoring: Secondary | ICD-10-CM

## 2015-11-12 MED ORDER — BENZONATATE 100 MG PO CAPS: 100.0000 mg | ORAL_CAPSULE | Freq: Two times a day (BID) | ORAL | Status: DC | PRN

## 2015-11-12 MED ORDER — FLECAINIDE ACETATE 150 MG PO TABS: 75.0000 mg | ORAL_TABLET | Freq: Two times a day (BID) | ORAL | Status: DC

## 2015-11-12 MED ORDER — AMOXICILLIN-POT CLAVULANATE 875-125 MG PO TABS: 1.0000 | ORAL_TABLET | Freq: Two times a day (BID) | ORAL | Status: DC

## 2015-11-12 NOTE — Patient Instructions (Addendum)
Medication Instructions:  Your physician has recommended you make the following change in your medication:  1.  STOP the Flecainide as needed 2.  START the Flecainide 150 mg taking 1/2 tablet twice a day   Labwork: None ordered  Testing/Procedures: Your physician has requested that you have an exercise tolerance test. For further information please visit [email or web address]. Please also follow instruction sheet, as given.    Follow-Up: Your physician recommends that you schedule a follow-up appointment in: 2 WEEKS IN THE A-FIB CLINIC   Any Other Special Instructions Will Be Listed Below (If Applicable). Exercise Stress Electrocardiogram An exercise stress electrocardiogram is a test to check how blood flows to your heart. It is done to find areas of poor blood flow. You will need to walk on a treadmill for this test. The electrocardiogram will record your heartbeat when you are at rest and when you are exercising. BEFORE THE PROCEDURE  Do not have drinks with caffeine or foods with caffeine for 24 hours before the test, or as told by your doctor. This includes coffee, tea (even decaf tea), sodas, chocolate, and cocoa.  Follow your doctor's instructions about eating and drinking before the test.  Ask your doctor what medicines you should or should not take before the test. Take your medicines with water unless told by your doctor not to.  If you use an inhaler, bring it with you to the test.  Bring a snack to eat after the test.  Do not  smoke for 4 hours before the test.  Do not put lotions, powders, creams, or oils on your chest before the test.  Wear comfortable shoes and clothing. PROCEDURE  You will have patches put on your chest. Small areas of your chest may need to be shaved. Wires will be connected to the patches.  Your heart rate will be watched while you are resting and while you are exercising.  You will walk on the treadmill. The treadmill will slowly get  faster to raise your heart rate.  The test will take about 1-2 hours. AFTER THE PROCEDURE  Your heart rate and blood pressure will be watched after the test.  You may return to your normal diet, activities, and medicines or as told by your doctor.   This information is not intended to replace advice given to you by your health care provider. Make sure you discuss any questions you have with your health care provider.   Document Released: 11/04/2007 Document Revised: 06/08/2014 Document Reviewed: 01/23/2013 Elsevier Interactive Patient Education [employer].    If you need a refill on your cardiac medications before your next appointment, please call your pharmacy.

## 2015-11-12 NOTE — Progress Notes (Signed)
   HPI  Patient presents today here with cough and congestion.  Patient claims that over the last 5-7 days she's had cough and persistent chest congestion. She also complains of mild dyspnea, malaise and fatigue. She has had struggles with A. fib with RVR lately and has cardiology follow-up today. She states this could be causing her malaise and fatigue.  She's tolerating food and fluids normally. She denies any fevers. She has had chills.  She describes her cough as a deep productive cough. She denies any chest pain. She has one recent emergency room visit about 5 days ago for A. fib with RVR, flecainide did not help that episode but has helped a few episodes since that time.   PMH: Smoking status noted ROS: Per HPI  Objective: BP 157/76 mmHg  Pulse 60  Temp(Src) 97.6 F (36.4 C) (Oral)  Ht 5\' 3"  (1.6 m)  Wt 247 lb 12.8 oz (112.401 kg)  BMI 43.91 kg/m2 Gen: NAD, alert, cooperative with exam HEENT: NCAT,areas with swelling left turbinate, TMs normal bilaterally  [id number] rate, [id number] systolic murmur loudest at the right sternal border  Resp:Nonlabored, right upper lung field with some coarse sounds and soft rhonchi  Ext: No edema, warm Neuro: Alert and oriented, No gross deficits  Assessment and plan:  # Acquired pneumonia Clinical diagnosis, around one week of symptoms with progressive course and very soft findings in the right upper lung field cause concern for developing pneumonia. Her with Augmentin Tessalon Recommend plain Zyrtec or Claritin for antihistamine for daily decongestant Return to clinic as needed, she has close cardiology follow-up this afternoon for A. fib    Meds ordered this encounter  Medications  . amoxicillin-clavulanate (AUGMENTIN) 875-125 MG tablet    Sig: Take 1 tablet by mouth 2 (two) times daily.    Dispense:  20 tablet    Refill:  0  . benzonatate (TESSALON) 100 MG capsule    Sig: Take 1 capsule (100 mg total) by mouth 2 (two) times daily  as needed for cough.    Dispense:  20 capsule    Refill:  Hil[place], MD [facility] 11/12/2015, 8:23 AM

## 2015-11-12 NOTE — Patient Instructions (Signed)
Great to meet you!  I am treating you for a bacterial infection with augmentin, a strong penicillin antibiotic.   Take all of the antibiotics, eat yogurt or take a pro-biotic once daily while you are on the medicne.    Community-Acquired Pneumonia, Adult Pneumonia is an infection of the lungs. There are different types of pneumonia. One type can develop while a person is in a hospital. A different type, called community-acquired pneumonia, develops in people who are not, or have not recently been, in the hospital or other health care facility.  CAUSES Pneumonia may be caused by bacteria, viruses, or funguses. Community-acquired pneumonia is often caused by Streptococcus pneumonia bacteria. These bacteria are often passed from one person to another by breathing in droplets from the cough or sneeze of an infected person. RISK FACTORS The condition is more likely to develop in:  People who havechronic diseases, such as chronic obstructive pulmonary disease (COPD), asthma, congestive heart failure, cystic fibrosis, diabetes, or kidney disease.  People who haveearly-stage or late-stage HIV.  People who havesickle cell disease.  People who havehad their spleen removed (splenectomy).  People who havepoor Human resources officer.  People who havemedical conditions that increase the risk of breathing in (aspirating) secretions their own mouth and nose.   People who havea weakened immune system (immunocompromised).  People who smoke.  People whotravel to areas where pneumonia-causing germs commonly exist.  People whoare around animal habitats or animals that have pneumonia-causing germs, including birds, bats, rabbits, cats, and farm animals. SYMPTOMS Symptoms of this condition include:  Adry cough.  A wet (productive) cough.  Fever.  Sweating.  Chest pain, especially when breathing deeply or coughing.  Rapid breathing or difficulty breathing.  Shortness of breath.  Shaking  chills.  Fatigue.  Muscle aches. DIAGNOSIS Your health care provider will take a medical history and perform a physical exam. You may also have other tests, including:  Imaging studies of your chest, including X-rays.  Tests to check your blood oxygen level and other blood gases.  Other tests on blood, mucus (sputum), fluid around your lungs (pleural fluid), and urine. If your pneumonia is severe, other tests may be done to identify the specific cause of your illness. TREATMENT The type of treatment that you receive depends on many factors, such as the cause of your pneumonia, the medicines you take, and other medical conditions that you have. For most adults, treatment and recovery from pneumonia may occur at home. In some cases, treatment must happen in a hospital. Treatment may include:  Antibiotic medicines, if the pneumonia was caused by bacteria.  Antiviral medicines, if the pneumonia was caused by a virus.  Medicines that are given by mouth or through an IV tube.  Oxygen.  Respiratory therapy. Although rare, treating severe pneumonia may include:  Mechanical ventilation. This is done if you are not breathing well on your own and you cannot maintain a safe blood oxygen level.  Thoracentesis. This procedureremoves fluid around one lung or both lungs to help you breathe better. HOME CARE INSTRUCTIONS  Take over-the-counter and prescription medicines only as told by your health care provider.  Only takecough medicine if you are losing sleep. Understand that cough medicine can prevent your body's natural ability to remove mucus from your lungs.  If you were prescribed an antibiotic medicine, take it as told by your health care provider. Do not stop taking the antibiotic even if you start to feel better.  Sleep in a semi-upright position at night. Try  sleeping in a reclining chair, or place a few pillows under your head.  Do not use tobacco products, including cigarettes,  chewing tobacco, and e-cigarettes. If you need help quitting, ask your health care provider.  Drink enough water to keep your urine clear or pale yellow. This will help to thin out mucus secretions in your lungs. PREVENTION There are ways that you can decrease your risk of developing community-acquired pneumonia. Consider getting a pneumococcal vaccine if:  You are older than 66 years of age.  You are older than 66 years of age and are undergoing cancer treatment, have chronic lung disease, or have other medical conditions that affect your immune system. Ask your health care provider if this applies to you. There are different types and schedules of pneumococcal vaccines. Ask your health care provider which vaccination option is best for you. You may also prevent community-acquired pneumonia if you take these actions:  Get an influenza vaccine every year. Ask your health care provider which type of influenza vaccine is best for you.  Go to the dentist on a regular basis.  Wash your hands often. Use hand sanitizer if soap and water are not available. SEEK MEDICAL CARE IF:  You have a fever.  You are losing sleep because you cannot control your cough with cough medicine. SEEK IMMEDIATE MEDICAL CARE IF:  You have worsening shortness of breath.  You have increased chest pain.  Your sickness becomes worse, especially if you are an older adult or have a weakened immune system.  You cough up blood.   This information is not intended to replace advice given to you by your health care provider. Make sure you discuss any questions you have with your health care provider.   Document Released: 05/18/2005 Document Revised: 02/06/2015 Document Reviewed: 09/12/2014 Elsevier Interactive Patient Education [employer].

## 2015-11-19 ENCOUNTER — Ambulatory Visit (INDEPENDENT_AMBULATORY_CARE_PROVIDER_SITE_OTHER): Payer: Medicare Other

## 2015-11-19 DIAGNOSIS — Z5181 Encounter for therapeutic drug level monitoring: Secondary | ICD-10-CM

## 2015-11-19 DIAGNOSIS — Z79899 Other long term (current) drug therapy: Secondary | ICD-10-CM | POA: Diagnosis not present

## 2015-11-19 LAB — EXERCISE TOLERANCE TEST
CHL CUP MPHR: 155 {beats}/min
CSEPEDS: 16 s
CSEPEW: 2.9 METS
CSEPHR: 56 %
CSEPPHR: 88 {beats}/min
Exercise duration (min): 4 min
RPE: 15
Rest HR: 53 {beats}/min

## 2015-11-22 ENCOUNTER — Telehealth: Payer: Self-pay | Admitting: Physician Assistant

## 2015-11-22 NOTE — Telephone Encounter (Signed)
Returned pts call.  She has been made aware of her stress test results/

## 2015-11-22 NOTE — Telephone Encounter (Signed)
New message ° ° ° ° °Returning a call to get stress test results °

## 2015-11-26 ENCOUNTER — Ambulatory Visit (HOSPITAL_COMMUNITY)
Admission: RE | Admit: 2015-11-26 | Discharge: 2015-11-26 | Disposition: A | Discharge status: Home / Self Care | Payer: Medicare Other | Source: Ambulatory Visit | Attending: Nurse Practitioner | Admitting: Nurse Practitioner

## 2015-11-26 ENCOUNTER — Encounter (HOSPITAL_COMMUNITY): Payer: Self-pay | Admitting: Nurse Practitioner

## 2015-11-26 VITALS — BP 180/80 | HR 71 | Ht 63.0 in | Wt 248.0 lb

## 2015-11-26 DIAGNOSIS — I480 Paroxysmal atrial fibrillation: Secondary | ICD-10-CM

## 2015-11-26 DIAGNOSIS — E669 Obesity, unspecified: Secondary | ICD-10-CM | POA: Insufficient documentation

## 2015-11-26 DIAGNOSIS — E039 Hypothyroidism, unspecified: Secondary | ICD-10-CM | POA: Insufficient documentation

## 2015-11-26 DIAGNOSIS — E119 Type 2 diabetes mellitus without complications: Secondary | ICD-10-CM | POA: Diagnosis not present

## 2015-11-26 DIAGNOSIS — Z7901 Long term (current) use of anticoagulants: Secondary | ICD-10-CM | POA: Diagnosis not present

## 2015-11-26 DIAGNOSIS — R011 Cardiac murmur, unspecified: Secondary | ICD-10-CM | POA: Insufficient documentation

## 2015-11-26 DIAGNOSIS — G4733 Obstructive sleep apnea (adult) (pediatric): Secondary | ICD-10-CM | POA: Diagnosis not present

## 2015-11-26 DIAGNOSIS — I10 Essential (primary) hypertension: Secondary | ICD-10-CM | POA: Diagnosis not present

## 2015-11-26 DIAGNOSIS — D649 Anemia, unspecified: Secondary | ICD-10-CM | POA: Diagnosis not present

## 2015-11-26 DIAGNOSIS — Z7984 Long term (current) use of oral hypoglycemic drugs: Secondary | ICD-10-CM | POA: Insufficient documentation

## 2015-11-26 DIAGNOSIS — R9431 Abnormal electrocardiogram [ECG] [EKG]: Secondary | ICD-10-CM | POA: Insufficient documentation

## 2015-11-26 DIAGNOSIS — M1990 Unspecified osteoarthritis, unspecified site: Secondary | ICD-10-CM | POA: Insufficient documentation

## 2015-11-26 DIAGNOSIS — I4891 Unspecified atrial fibrillation: Secondary | ICD-10-CM

## 2015-11-26 MED ORDER — DILTIAZEM HCL 30 MG PO TABS: ORAL_TABLET | ORAL | Status: DC

## 2015-11-26 NOTE — Progress Notes (Signed)
Patient ID: Emily [name], female   DOB: 1950-06-07, 65 y.o.   MRN: [id number]     Primary Care Physician: [name], MD Referring Physician: Raliegh Ip. [place], [place]   Emily [name] is a 65 y.o. female with a h/o PAF on PRN flecainide and Eliquis, OSA on CPAP, HTN, bilateral carotid artery disease s/p L CEA (07/2015), DMT2, and hypothyroidism who presents to afib clinic for evaluation.  She was last seen by Dr. Percival Spanish in 06/2015 for follow up. He mentioned that if she needed flecainide more frequently we may need to change therapy. Bruits were noted and carotid dopplers were ordered. This showed high-grade left internal carotid artery stenosis and moderate right ICA stenosis. She was referred to Dr. Gwenlyn Found. He felt that she would be an excellent candidate for CEA. A 2D ECHO and myovew was ordered for further risk stratification. 2D ECHO (07/17/15) showed normal LV function, mod LVH, G1DD, mild AS. Myoview (07/17/15) was low risk but hypertensive at rest and severe hypertension with stress.   She underwent successful L CEA with bovine pericardial patch angioplasty on 08/30/2015 by Dr. Trula Slade.   She was seen in th ER on 09/03/15 for afib. She called EMS after pill in the pocket flecainide didn't work, but she converted while en route to ER after given diltiazem.   She was seen in the ER on 11/05/15 for pain in between her shoulder blades, SOB and diaphoresis. She was in afib with RVR and underwent DCCV. ECG with some ST depression which was felt to be rate related. She was discharged after successful DCCV.   Since that time she has had a few more episodes which flecainide and vagal maneuvers has terminated. When she goes into afib she can feel a flutter in her chest and neck. It then continues into her back .She has afib since 2015 but flecainide pill in pocket has worked until recently. She is now on flecainide 75 mg bid and is doing well without any further afib. She has noticed if she does not use  cpap or it accidentally comes off during the night, it will trigger afib.  She does not drink alcohol, minimal caffeine, no tobacco, no exercise and is morbidly obese.She had a ETT 6/20 walking on treadmill for 4 mins with no arrhythmia during test. Could not meet target heart rate due to  pharmacologic blunting of response to exercise.  Chadsvasc score of  at least 5.      Today, she denies symptoms of palpitations, chest pain, shortness of breath, orthopnea, PND, lower extremity edema, dizziness, presyncope, syncope, or neurologic sequela. The patient is tolerating medications without difficulties and is otherwise without complaint today.   Past Medical History  Diagnosis Date  . Diabetes mellitus without complication ([place])   . Hypertension   . Hypothyroid   . Arthritis   . PAF (paroxysmal atrial fibrillation) ([place])     a. newly dx in 09/2013; on eliquis  . Bilateral carotid artery disease ([place])   . Obesity   . Obstructive sleep apnea     on C Pap  . Dysrhythmia   . Heart murmur   . Difficult intubation     states 'lady that did the sleep study told me I have the smallest airway she has ever seen in an adult"  . Anemia    Past Surgical History  Procedure Laterality Date  . Tonsillectomy    . Dilation and curettage of uterus      x 5  . Cyst on  back of neck removed    . Colonoscopy w/ polypectomy    . Eye surgery    . Cataract extraction Left   . Endarterectomy Left 08/30/2015    Procedure: LEFT CAROYID ENDARTERECTOMY WITH XENOSURE BOVINE PERICARDIUM PATCH ANGIOPLASTY;  Surgeon: [name], MD;  Location: [facility] OR;  Service: Vascular;  Laterality: Left;    Current Outpatient Prescriptions  Medication Sig Dispense Refill  . apixaban (ELIQUIS) 5 MG TABS tablet Take 1 tablet (5 mg total) by mouth 2 (two) times daily. 60 tablet 5  . benzonatate (TESSALON) 100 MG capsule Take 1 capsule (100 mg total) by mouth 2 (two) times daily as needed for cough. 20 capsule 0  . diltiazem  (CARDIZEM LA) 180 MG 24 hr tablet Take 1 tablet (180 mg total) by mouth daily. **Please disregard the rx that was sent in for #15. Thanks** 90 tablet 1  . flecainide (TAMBOCOR) 150 MG tablet Take 0.5 tablets (75 mg total) by mouth 2 (two) times daily. 90 tablet 3  . furosemide (LASIX) 20 MG tablet Take 20 mg by mouth daily as needed for fluid.     Marland Kitchen glipiZIDE (GLUCOTROL) 5 MG tablet Take 5 mg by mouth 2 (two) times daily before a meal.     . hydrochlorothiazide (HYDRODIURIL) 25 MG tablet Take 25 mg by mouth daily.    Marland Kitchen ibuprofen (ADVIL,MOTRIN) 200 MG tablet Take 600 mg by mouth daily as needed for moderate pain.    Marland Kitchen levothyroxine (SYNTHROID, LEVOTHROID) 112 MCG tablet Take 56-168 mcg by mouth See admin instructions. TAKES ONE TABLET DAILY. TAKES ONE-HALF TABLET IN ADDITION, EVERY OTHER DAY ONLY    . losartan (COZAAR) 50 MG tablet Take 50 mg by mouth daily.    Marland Kitchen lovastatin (MEVACOR) 20 MG tablet Take 20 mg by mouth every evening.     . metFORMIN (GLUCOPHAGE) 500 MG tablet Take 500 mg by mouth 2 (two) times daily.    . metoprolol succinate (TOPROL-XL) 100 MG 24 hr tablet Take 100 mg by mouth daily.    Marland Kitchen diltiazem (CARDIZEM) 30 MG tablet Cardizem 30mg  -- take 1 tablet every 4 hours AS NEEDED for heart rate >100 as long as blood pressure >100. 45 tablet 1   No current facility-administered medications for this encounter.    Allergies  Allergen Reactions  . Quinapril Hcl Cough  . Statins Other (See Comments)    PAIN IN LEGS  . Tape Other (See Comments)    Redness, please use "paper" tape    Social History   Social History  . Marital Status: Single    Spouse Name: N/A  . Number of Children: N/A  . Years of Education: N/A   Occupational History  . Not on file.   Social History Main Topics  . Smoking status: Never Smoker   . Smokeless tobacco: Never Used  . Alcohol Use: No  . Drug Use: No  . Sexual Activity: Not on file   Other Topics Concern  . Not on file   Social History  Narrative    Family History  Problem Relation Age of Onset  . COPD Father   . Heart failure Father   . Heart disease Father   . Arrhythmia Sister   . Arrhythmia Sister   . Arrhythmia Sister     had PPM also  . Cancer Sister     ROS- All systems are reviewed and negative except as per the HPI above  Physical Exam: Filed Vitals:   11/26/15  0921  BP: 180/80  Pulse: 71  Height: 5\' 3"  (1.6 m)  Weight: 248 lb (112.492 kg)    GEN- The patient is well appearing, alert and oriented x 3 today.   Head- normocephalic, atraumatic Eyes-  Sclera clear, conjunctiva pink Ears- hearing intact Oropharynx- clear Neck- supple, no JVP Lymph- no cervical lymphadenopathy Lungs- Clear to ausculation bilaterally, normal work of breathing Heart- Regular rate and rhythm, no murmurs, rubs or gallops, PMI not laterally displaced GI- soft, NT, ND, + BS Extremities- no clubbing, cyanosis, or edema MS- no significant deformity or atrophy Skin- no rash or lesion Psych- euthymic mood, full affect Neuro- strength and sensation are intact  EKG- NSR at 71 bpm, pr int 160 ms, qrs int 102 ms, qtc 456 ms Epic records reviewed  Assessment and Plan: 1. PAF Continue with flecainide 75 mg bid Will RX 30 mg cardizem  to use as needed for breakthrough episodes Continue apixaban 5 mg bid  I think the ETT on flecainide is sufficient because her heart rate increased to 88 from 53 and there was no arrythmia  2. HTN Poorly controlled She states usally is better at home with sys 140-150 sy Is not restricting sodium in the diet and was encouraged to do so   3.Lifestyle issues contributing to afib Use cpap on a regular basis Encouraged regular exercise Encouraged weight loss class, but she says she knows what to do, its just the willpower she is missing   F/u in one month   Butch Penny C. Demont Linford, [facility] [address] [place], [place] [address] [phone]

## 2015-11-26 NOTE — Patient Instructions (Signed)
Your physician has recommended you make the following change in your medication:   1)Cardizem 30mg -- take 1 tablet every 4 hours AS NEEDED for heart rate >100 as long as blood pressure >100.   

## 2015-11-30 ENCOUNTER — Other Ambulatory Visit: Payer: Self-pay | Admitting: Cardiology

## 2015-12-02 NOTE — Patient Instructions (Signed)
Emily [name]  12/02/2015     @PREFPERIOPPHARMACY @   Your procedure is scheduled on 12/09/2015.  Report to [name] at 6:15 A.M.  Call this number if you have problems the morning of surgery:  [phone]   Remember:  Do not eat food or drink liquids after midnight.  Take these medicines the morning of surgery with A SIP OF WATER Cardizem, Flecainide, Synthroid, Losartan, Torol  DO NOT TAKE ANY MEDICATION FOR DIABETES MORNING OF PROCEDURE   Do not wear jewelry, make-up or nail polish.  Do not wear lotions, powders, or perfumes.  You may wear deoderant.  Do not shave 48 hours prior to surgery.  Men may shave face and neck.  Do not bring valuables to the hospital.  [facility] is not responsible for any belongings or valuables.  Contacts, dentures or bridgework may not be worn into surgery.  Leave your suitcase in the car.  After surgery it may be brought to your room.  For patients admitted to the hospital, discharge time will be determined by your treatment team.  Patients discharged the day of surgery will not be allowed to drive home.    Please read over the following fact sheets that you were given. Anesthesia Post-op Instructions     PATIENT INSTRUCTIONS POST-ANESTHESIA  IMMEDIATELY FOLLOWING SURGERY:  Do not drive or operate machinery for the first twenty four hours after surgery.  Do not make any important decisions for twenty four hours after surgery or while taking narcotic pain medications or sedatives.  If you develop intractable nausea and vomiting or a severe headache please notify your doctor immediately.  FOLLOW-UP:  Please make an appointment with your surgeon as instructed. You do not need to follow up with anesthesia unless specifically instructed to do so.  WOUND CARE INSTRUCTIONS (if applicable):  Keep a dry clean dressing on the anesthesia/puncture wound site if there is drainage.  Once the wound has quit draining you may leave it open to air.   Generally you should leave the bandage intact for twenty four hours unless there is drainage.  If the epidural site drains for more than 36-48 hours please call the anesthesia department.  QUESTIONS?:  Please feel free to call your physician or the hospital operator if you have any questions, and they will be happy to assist you.       A cataract is a clouding of the lens of the eye. When a lens becomes cloudy, vision is reduced based on the degree and nature of the clouding. Surgery may be needed to improve vision. Surgery removes the cloudy lens and usually replaces it with a substitute lens (intraocular lens, IOL). LET YOUR EYE DOCTOR KNOW ABOUT:  Allergies to food or medicine.  Medicines taken including herbs, eye drops, over-the-counter medicines, and creams.  Use of steroids (by mouth or creams).  Previous problems with anesthetics or numbing medicine.  History of bleeding problems or blood clots.  Previous surgery.  Other health problems, including diabetes and kidney problems.  Possibility of pregnancy, if this applies. RISKS AND COMPLICATIONS  Infection.  Inflammation of the eyeball (endophthalmitis) that can spread to both eyes (sympathetic ophthalmia).  Poor wound healing.  If an IOL is inserted, it can later fall out of proper position. This is very uncommon.  Clouding of the part of your eye that holds an IOL in place. This is called an "after-cataract." These are uncommon but easily treated. BEFORE THE PROCEDURE  Do not eat or drink  anything except small amounts of water for 8 to 12 before your surgery, or as directed by your caregiver.  Unless you are told otherwise, continue any eye drops you have been prescribed.  Talk to your primary caregiver about all other medicines that you take (both prescription and nonprescription). In some cases, you may need to stop or change medicines near the time of your surgery. This is most important if you are taking  blood-thinning medicine.Do not stop medicines unless you are told to do so.  Arrange for someone to drive you to and from the procedure.  Do not put contact lenses in either eye on the day of your surgery. PROCEDURE There is more than one method for safely removing a cataract. Your doctor can explain the differences and help determine which is best for you. Phacoemulsification surgery is the most common form of cataract surgery.  An injection is given behind the eye or eye drops are given to make this a painless procedure.  A small cut (incision) is made on the edge of the clear, dome-shaped surface that covers the front of the eye (cornea).  A tiny probe is painlessly inserted into the eye. This device gives off ultrasound waves that soften and break up the cloudy center of the lens. This makes it easier for the cloudy lens to be removed by suction.  An IOL may be implanted.  The normal lens of the eye is covered by a clear capsule. Part of that capsule is intentionally left in the eye to support the IOL.  Your surgeon may or may not use stitches to close the incision. There are other forms of cataract surgery that require a larger incision and stitches to close the eye. This approach is taken in cases where the doctor feels that the cataract cannot be easily removed using phacoemulsification. AFTER THE PROCEDURE  When an IOL is implanted, it does not need care. It becomes a permanent part of your eye and cannot be seen or felt.  Your doctor will schedule follow-up exams to check on your progress.  Review your other medicines with your doctor to see which can be resumed after surgery.  Use eye drops or take medicine as prescribed by your doctor.   This information is not intended to replace advice given to you by your health care provider. Make sure you discuss any questions you have with your health care provider.   Document Released: 05/07/2011 Document Revised: 06/08/2014  Document Reviewed: 05/07/2011 Elsevier Interactive Patient Education [employer].

## 2015-12-04 ENCOUNTER — Encounter (HOSPITAL_COMMUNITY)
Admission: RE | Admit: 2015-12-04 | Discharge: 2015-12-04 | Disposition: A | Discharge status: Home / Self Care | Payer: Medicare Other | Source: Ambulatory Visit | Attending: Ophthalmology | Admitting: Ophthalmology

## 2015-12-04 ENCOUNTER — Encounter (HOSPITAL_COMMUNITY): Payer: Self-pay

## 2015-12-04 DIAGNOSIS — I4891 Unspecified atrial fibrillation: Secondary | ICD-10-CM | POA: Diagnosis not present

## 2015-12-04 DIAGNOSIS — Z01812 Encounter for preprocedural laboratory examination: Secondary | ICD-10-CM | POA: Insufficient documentation

## 2015-12-04 DIAGNOSIS — I10 Essential (primary) hypertension: Secondary | ICD-10-CM | POA: Insufficient documentation

## 2015-12-04 LAB — BASIC METABOLIC PANEL
ANION GAP: 8 (ref 5–15)
BUN: 19 mg/dL (ref 6–20)
CALCIUM: 8.6 mg/dL — AB (ref 8.9–10.3)
CO2: 26 mmol/L (ref 22–32)
CREATININE: 0.54 mg/dL (ref 0.44–1.00)
Chloride: 98 mmol/L — ABNORMAL LOW (ref 101–111)
GFR calc non Af Amer: >60 mL/min (ref >60)
Glucose, Bld: 276 mg/dL — ABNORMAL HIGH (ref 65–99)
Potassium: 3.9 mmol/L (ref 3.5–5.1)
SODIUM: 132 mmol/L — AB (ref 135–145)

## 2015-12-04 LAB — CBC
HEMATOCRIT: 31.7 % — AB (ref 36.0–46.0)
HEMOGLOBIN: 9.5 g/dL — AB (ref 12.0–15.0)
MCH: 24.9 pg — ABNORMAL LOW (ref 26.0–34.0)
MCHC: 30 g/dL (ref 30.0–36.0)
MCV: 83 fL (ref 78.0–100.0)
Platelets: 230 10*3/uL (ref 150–400)
RBC: 3.82 MIL/uL — ABNORMAL LOW (ref 3.87–5.11)
RDW: 16.8 % — AB (ref 11.5–15.5)
WBC: 8 10*3/uL (ref 4.0–10.5)

## 2015-12-06 MED ORDER — LIDOCAINE HCL (PF) 1 % IJ SOLN
INTRAMUSCULAR | Status: AC
  Filled 2015-12-06: qty 2

## 2015-12-06 MED ORDER — TETRACAINE HCL 0.5 % OP SOLN
OPHTHALMIC | Status: AC
  Filled 2015-12-06: qty 4

## 2015-12-06 MED ORDER — CYCLOPENTOLATE-PHENYLEPHRINE OP SOLN OPTIME - NO CHARGE
OPHTHALMIC | Status: AC
  Filled 2015-12-06: qty 2

## 2015-12-06 MED ORDER — PHENYLEPHRINE HCL 2.5 % OP SOLN
OPHTHALMIC | Status: AC
  Filled 2015-12-06: qty 15

## 2015-12-06 MED ORDER — LIDOCAINE HCL 3.5 % OP GEL
OPHTHALMIC | Status: AC
  Filled 2015-12-06: qty 1

## 2015-12-06 MED ORDER — NEOMYCIN-POLYMYXIN-DEXAMETH 3.5-10000-0.1 OP SUSP
OPHTHALMIC | Status: AC
  Filled 2015-12-06: qty 5

## 2015-12-09 ENCOUNTER — Encounter (HOSPITAL_COMMUNITY)
Admission: RE | Disposition: A | Discharge status: Home / Self Care | Payer: Self-pay | Source: Ambulatory Visit | Attending: Ophthalmology

## 2015-12-09 ENCOUNTER — Encounter (HOSPITAL_COMMUNITY): Payer: Self-pay | Admitting: *Deleted

## 2015-12-09 ENCOUNTER — Ambulatory Visit (HOSPITAL_COMMUNITY): Payer: Medicare Other | Admitting: Anesthesiology

## 2015-12-09 ENCOUNTER — Ambulatory Visit (HOSPITAL_COMMUNITY)
Admission: RE | Admit: 2015-12-09 | Discharge: 2015-12-09 | Disposition: A | Discharge status: Home / Self Care | Payer: Medicare Other | Source: Ambulatory Visit | Attending: Ophthalmology | Admitting: Ophthalmology

## 2015-12-09 DIAGNOSIS — Z7901 Long term (current) use of anticoagulants: Secondary | ICD-10-CM | POA: Diagnosis not present

## 2015-12-09 DIAGNOSIS — I10 Essential (primary) hypertension: Secondary | ICD-10-CM | POA: Insufficient documentation

## 2015-12-09 DIAGNOSIS — H25811 Combined forms of age-related cataract, right eye: Secondary | ICD-10-CM | POA: Diagnosis not present

## 2015-12-09 DIAGNOSIS — Z79899 Other long term (current) drug therapy: Secondary | ICD-10-CM | POA: Diagnosis not present

## 2015-12-09 DIAGNOSIS — E1136 Type 2 diabetes mellitus with diabetic cataract: Secondary | ICD-10-CM | POA: Insufficient documentation

## 2015-12-09 DIAGNOSIS — Z7984 Long term (current) use of oral hypoglycemic drugs: Secondary | ICD-10-CM | POA: Diagnosis not present

## 2015-12-09 DIAGNOSIS — H269 Unspecified cataract: Secondary | ICD-10-CM | POA: Diagnosis present

## 2015-12-09 HISTORY — PX: CATARACT EXTRACTION W/PHACO: SHX586

## 2015-12-09 LAB — GLUCOSE, CAPILLARY: GLUCOSE-CAPILLARY: 256 mg/dL — AB (ref 65–99)

## 2015-12-09 SURGERY — PHACOEMULSIFICATION, CATARACT, WITH IOL INSERTION
Anesthesia: Monitor Anesthesia Care | Site: Eye | Laterality: Right

## 2015-12-09 MED ORDER — LACTATED RINGERS IV SOLN
INTRAVENOUS | Status: DC
  Administered 2015-12-09: 1000 mL via INTRAVENOUS

## 2015-12-09 MED ORDER — MIDAZOLAM HCL 2 MG/2ML IJ SOLN
INTRAMUSCULAR | Status: AC
  Filled 2015-12-09: qty 2

## 2015-12-09 MED ORDER — FENTANYL CITRATE (PF) 100 MCG/2ML IJ SOLN
INTRAMUSCULAR | Status: AC
  Filled 2015-12-09: qty 2

## 2015-12-09 MED ORDER — MIDAZOLAM HCL 2 MG/2ML IJ SOLN
1.0000 mg | INTRAMUSCULAR | Status: DC | PRN
  Administered 2015-12-09: 2 mg via INTRAVENOUS

## 2015-12-09 MED ORDER — PHENYLEPHRINE HCL 2.5 % OP SOLN
1.0000 [drp] | OPHTHALMIC | Status: AC
  Administered 2015-12-09 (×3): 1 [drp] via OPHTHALMIC

## 2015-12-09 MED ORDER — EPINEPHRINE HCL 1 MG/ML IJ SOLN
INTRAOCULAR | Status: DC | PRN
  Administered 2015-12-09: 500 mL

## 2015-12-09 MED ORDER — LIDOCAINE HCL 3.5 % OP GEL
1.0000 "application " | Freq: Once | OPHTHALMIC | Status: AC
  Administered 2015-12-09: 1 via OPHTHALMIC

## 2015-12-09 MED ORDER — NEOMYCIN-POLYMYXIN-DEXAMETH 3.5-10000-0.1 OP SUSP
OPHTHALMIC | Status: DC | PRN
  Administered 2015-12-09: 1 [drp] via OPHTHALMIC

## 2015-12-09 MED ORDER — TETRACAINE HCL 0.5 % OP SOLN
1.0000 [drp] | OPHTHALMIC | Status: AC
  Administered 2015-12-09 (×3): 1 [drp] via OPHTHALMIC

## 2015-12-09 MED ORDER — LIDOCAINE 3.5 % OP GEL OPTIME - NO CHARGE
OPHTHALMIC | Status: DC | PRN
  Administered 2015-12-09: 1 [drp] via OPHTHALMIC

## 2015-12-09 MED ORDER — LIDOCAINE HCL (PF) 1 % IJ SOLN
INTRAMUSCULAR | Status: DC | PRN
  Administered 2015-12-09: .7 mL

## 2015-12-09 MED ORDER — CYCLOPENTOLATE-PHENYLEPHRINE 0.2-1 % OP SOLN
1.0000 [drp] | OPHTHALMIC | Status: AC
  Administered 2015-12-09 (×3): 1 [drp] via OPHTHALMIC

## 2015-12-09 MED ORDER — EPINEPHRINE HCL 1 MG/ML IJ SOLN
INTRAMUSCULAR | Status: AC
  Filled 2015-12-09: qty 1

## 2015-12-09 MED ORDER — PROVISC 10 MG/ML IO SOLN
INTRAOCULAR | Status: DC | PRN
  Administered 2015-12-09: 0.85 mL via INTRAOCULAR

## 2015-12-09 MED ORDER — BSS IO SOLN
INTRAOCULAR | Status: DC | PRN
  Administered 2015-12-09: 15 mL

## 2015-12-09 MED ORDER — POVIDONE-IODINE 5 % OP SOLN
OPHTHALMIC | Status: DC | PRN
  Administered 2015-12-09: 1 via OPHTHALMIC

## 2015-12-09 MED ORDER — FENTANYL CITRATE (PF) 100 MCG/2ML IJ SOLN
25.0000 ug | INTRAMUSCULAR | Status: AC | PRN
  Administered 2015-12-09 (×2): 25 ug via INTRAVENOUS

## 2015-12-09 SURGICAL SUPPLY — 10 items
CLOTH BEACON ORANGE TIMEOUT ST (SAFETY) ×3 IMPLANT
EYE SHIELD UNIVERSAL CLEAR (GAUZE/BANDAGES/DRESSINGS) ×3 IMPLANT
GLOVE BIOGEL PI IND STRL 7.0 (GLOVE) ×1 IMPLANT
GLOVE BIOGEL PI INDICATOR 7.0 (GLOVE) ×2
GLOVE EXAM NITRILE MD LF STRL (GLOVE) ×3 IMPLANT
PAD ARMBOARD 7.5X6 YLW CONV (MISCELLANEOUS) ×3 IMPLANT
SIGHTPATH CAT PROC W REG LENS (Ophthalmic Related) ×3 IMPLANT
SYRINGE LUER LOK 1CC (MISCELLANEOUS) ×3 IMPLANT
TAPE CLOTH 2 (GAUZE/BANDAGES/DRESSINGS) ×3 IMPLANT
WATER STERILE IRR 250ML POUR (IV SOLUTION) ×3 IMPLANT

## 2015-12-09 NOTE — H&P (Signed)
I have reviewed the H&P, the patient was re-examined, and I have identified no interval changes in medical condition and plan of care since the history and physical of record  

## 2015-12-09 NOTE — Op Note (Signed)
Date of Admission: 12/09/2015  Date of Surgery: 12/09/2015   Pre-Op Dx: Cataract Right Eye  Post-Op Dx: Senile Combined Cataract Right  Eye,  Dx Code [id number]  Surgeon: Tonny Branch, M.D.  Assistants: None  Anesthesia: Topical with MAC  Indications: Painless, progressive loss of vision with compromise of daily activities.  Surgery: Cataract Extraction with Intraocular lens Implant Right Eye  Discription: The patient had dilating drops and viscous lidocaine placed into the Right eye in the pre-op holding area. After transfer to the operating room, a time out was performed. The patient was then prepped and draped. Beginning with a 67 degree blade a paracentesis port was made at the surgeon's 2 o'clock position. The anterior chamber was then filled with 1% non-preserved lidocaine. This was followed by filling the anterior chamber with Provisc.  A 2.44mm keratome blade was used to make a clear corneal incision at the temporal limbus.  A bent cystatome needle was used to create a continuous tear capsulotomy. Hydrodissection was performed with balanced salt solution on a Fine canula. The lens nucleus was then removed using the phacoemulsification handpiece. Residual cortex was removed with the I&A handpiece. The anterior chamber and capsular bag were refilled with Provisc. A posterior chamber intraocular lens was placed into the capsular bag with it's injector. The implant was positioned with the Kuglan hook. The Provisc was then removed from the anterior chamber and capsular bag with the I&A handpiece. Stromal hydration of the main incision and paracentesis port was performed with BSS on a Fine canula. The wounds were tested for leak which was negative. The patient tolerated the procedure well. There were no operative complications. The patient was then transferred to the recovery room in stable condition.  Complications: None  Specimen: None  EBL: None  Prosthetic device: Hoya iSert 250, power 22.0  D, SN [id number].

## 2015-12-09 NOTE — Transfer of Care (Signed)
Immediate Anesthesia Transfer of Care Note  Patient: Emily [name]  Procedure(s) Performed: Procedure(s) with comments: CATARACT EXTRACTION PHACO AND INTRAOCULAR LENS PLACEMENT (IOC) (Right) - CDE:10.48  Patient Location: Short Stay  Anesthesia Type:MAC  Level of Consciousness: awake  Airway & Oxygen Therapy: Patient Spontanous Breathing  Post-op Assessment: Report given to RN  Post vital signs: Reviewed  Last Vitals:  Filed Vitals:   12/09/15 0710 12/09/15 0715  BP: 131/57 115/55  Pulse:    Temp:    Resp: 11 11    Last Pain: There were no vitals filed for this visit.    Patients Stated Pain Goal: 7 ([id number])  Complications: No apparent anesthesia complications

## 2015-12-09 NOTE — Discharge Instructions (Signed)
Anesthesia, Adult, Care After °Refer to this sheet in the next few weeks. These instructions provide you with information on caring for yourself after your procedure. Your health care provider may also give you more specific instructions. Your treatment has been planned according to current medical practices, but problems sometimes occur. Call your health care provider if you have any problems or questions after your procedure. °WHAT TO EXPECT AFTER THE PROCEDURE °After the procedure, it is typical to experience: °· Sleepiness. °· Nausea and vomiting. °HOME CARE INSTRUCTIONS °· For the first 24 hours after general anesthesia: °¨ Have a responsible person with you. °¨ Do not drive a car. If you are alone, do not take public transportation. °¨ Do not drink alcohol. °¨ Do not take medicine that has not been prescribed by your health care provider. °¨ Do not sign important papers or make important decisions. °¨ You may resume a normal diet and activities as directed by your health care provider. °· Change bandages (dressings) as directed. °· If you have questions or problems that seem related to general anesthesia, call the hospital and ask for the anesthetist or anesthesiologist on call. °SEEK MEDICAL CARE IF: °· You have nausea and vomiting that continue the day after anesthesia. °· You develop a rash. °SEEK IMMEDIATE MEDICAL CARE IF:  °· You have difficulty breathing. °· You have chest pain. °· You have any allergic problems. °  °This information is not intended to replace advice given to you by your health care provider. Make sure you discuss any questions you have with your health care provider. °  °Document Released: 08/24/2000 Document Revised: 06/08/2014 Document Reviewed: 09/16/2011 °Elsevier Interactive Patient Education ©2016 Elsevier Inc. ° °

## 2015-12-09 NOTE — Anesthesia Preprocedure Evaluation (Signed)
Anesthesia Evaluation  Patient identified by MRN, date of birth, ID band Patient awake    Reviewed: Allergy & Precautions, NPO status , Patient's Chart, lab work & pertinent test results  Airway Mallampati: II  TM Distance: >3 FB Neck ROM: Full    Dental  (+) Teeth Intact, Dental Advisory Given   Pulmonary    breath sounds clear to auscultation       Cardiovascular hypertension, + Peripheral Vascular Disease  + dysrhythmias  Rhythm:Regular Rate:Normal     Neuro/Psych    GI/Hepatic   Endo/Other  diabetes  Renal/GU      Musculoskeletal   Abdominal (+) + obese,   Peds  Hematology   Anesthesia Other Findings   Reproductive/Obstetrics                             Anesthesia Physical Anesthesia Plan  ASA: III  Anesthesia Plan: MAC   Post-op Pain Management:    Induction: Intravenous  Airway Management Planned: Nasal Cannula  Additional Equipment:   Intra-op Plan:   Post-operative Plan:   Informed Consent: I have reviewed the patients History and Physical, chart, labs and discussed the procedure including the risks, benefits and alternatives for the proposed anesthesia with the patient or authorized representative who has indicated his/her understanding and acceptance.     Plan Discussed with:   Anesthesia Plan Comments:         Anesthesia Quick Evaluation

## 2015-12-09 NOTE — Addendum Note (Signed)
Addendum  created 12/09/15 0751 by Ollen Bowl, CRNA   Modules edited: Anesthesia Flowsheet

## 2015-12-09 NOTE — Anesthesia Postprocedure Evaluation (Signed)
Anesthesia Post Note  Patient: Emily [name]  Procedure(s) Performed: Procedure(s) (LRB): CATARACT EXTRACTION PHACO AND INTRAOCULAR LENS PLACEMENT (IOC) (Right)  Patient location during evaluation: Short Stay Anesthesia Type: MAC Level of consciousness: awake and alert Pain management: pain level controlled Vital Signs Assessment: post-procedure vital signs reviewed and stable Respiratory status: spontaneous breathing Cardiovascular status: blood pressure returned to baseline Postop Assessment: no signs of nausea or vomiting Anesthetic complications: no    Last Vitals:  Filed Vitals:   12/09/15 0710 12/09/15 0715  BP: 131/57 115/55  Pulse:    Temp:    Resp: 11 11    Last Pain: There were no vitals filed for this visit.               Oisin Yoakum

## 2015-12-12 ENCOUNTER — Encounter (HOSPITAL_COMMUNITY): Payer: Self-pay | Admitting: Ophthalmology

## 2015-12-24 ENCOUNTER — Encounter (HOSPITAL_COMMUNITY): Payer: Self-pay | Admitting: Nurse Practitioner

## 2015-12-24 ENCOUNTER — Ambulatory Visit (HOSPITAL_COMMUNITY)
Admission: RE | Admit: 2015-12-24 | Discharge: 2015-12-24 | Disposition: A | Discharge status: Home / Self Care | Payer: Medicare Other | Source: Ambulatory Visit | Attending: Nurse Practitioner | Admitting: Nurse Practitioner

## 2015-12-24 VITALS — BP 160/76 | HR 59 | Ht 63.0 in | Wt 248.8 lb

## 2015-12-24 DIAGNOSIS — Z8249 Family history of ischemic heart disease and other diseases of the circulatory system: Secondary | ICD-10-CM | POA: Insufficient documentation

## 2015-12-24 DIAGNOSIS — E039 Hypothyroidism, unspecified: Secondary | ICD-10-CM | POA: Insufficient documentation

## 2015-12-24 DIAGNOSIS — Z6841 Body Mass Index (BMI) 40.0 and over, adult: Secondary | ICD-10-CM | POA: Insufficient documentation

## 2015-12-24 DIAGNOSIS — M1990 Unspecified osteoarthritis, unspecified site: Secondary | ICD-10-CM | POA: Insufficient documentation

## 2015-12-24 DIAGNOSIS — I4891 Unspecified atrial fibrillation: Secondary | ICD-10-CM | POA: Diagnosis present

## 2015-12-24 DIAGNOSIS — G4733 Obstructive sleep apnea (adult) (pediatric): Secondary | ICD-10-CM | POA: Insufficient documentation

## 2015-12-24 DIAGNOSIS — Z79899 Other long term (current) drug therapy: Secondary | ICD-10-CM | POA: Diagnosis not present

## 2015-12-24 DIAGNOSIS — Z825 Family history of asthma and other chronic lower respiratory diseases: Secondary | ICD-10-CM | POA: Insufficient documentation

## 2015-12-24 DIAGNOSIS — Z7901 Long term (current) use of anticoagulants: Secondary | ICD-10-CM | POA: Diagnosis not present

## 2015-12-24 DIAGNOSIS — Z888 Allergy status to other drugs, medicaments and biological substances status: Secondary | ICD-10-CM | POA: Diagnosis not present

## 2015-12-24 DIAGNOSIS — I10 Essential (primary) hypertension: Secondary | ICD-10-CM | POA: Insufficient documentation

## 2015-12-24 DIAGNOSIS — I480 Paroxysmal atrial fibrillation: Secondary | ICD-10-CM | POA: Insufficient documentation

## 2015-12-24 DIAGNOSIS — Z7984 Long term (current) use of oral hypoglycemic drugs: Secondary | ICD-10-CM | POA: Diagnosis not present

## 2015-12-24 DIAGNOSIS — E119 Type 2 diabetes mellitus without complications: Secondary | ICD-10-CM | POA: Insufficient documentation

## 2015-12-24 NOTE — Progress Notes (Signed)
Patient ID: Emily [name], female   DOB: Nov 01, 1950, 65 y.o.   MRN: [id number]     Primary Care Physician: Emily Chafe, MD Referring Physician: Raliegh Ip. [place] [name], [place]   Emily [name] is a 65 y.o. female with a h/o PAF on PRN flecainide and Eliquis, OSA on CPAP, HTN, bilateral carotid artery disease s/p L CEA (07/2015), DMT2, and hypothyroidism who presents to afib clinic for evaluation.  She was last seen by Dr. Percival [name] in 06/2015 for follow up. He mentioned that if she needed flecainide more frequently we may need to change therapy. Bruits were noted and carotid dopplers were ordered. This showed high-grade left internal carotid artery stenosis and moderate right ICA stenosis. She was referred to Dr. Gwenlyn [name]. He felt that she would be an excellent candidate for CEA. A 2D ECHO and myovew was ordered for further risk stratification. 2D ECHO (07/17/15) showed normal LV function, mod LVH, G1DD, mild AS. Myoview (07/17/15) was low risk but hypertensive at rest and severe hypertension with stress.   She underwent successful L CEA with bovine pericardial patch angioplasty on 08/30/2015 by Emily [name].   She was seen in th ER on 09/03/15 for afib. She called EMS after pill in the pocket flecainide didn't work, but she converted while en route to ER after given diltiazem.   She was seen in the ER on 11/05/15 for pain in between her shoulder blades, SOB and diaphoresis. She was in afib with RVR and underwent DCCV. ECG with some ST depression which was felt to be rate related. She was discharged after successful DCCV.   Since that time she has had a few more episodes which flecainide and vagal maneuvers has terminated. When she goes into afib she can feel a flutter in her chest and neck. It then continues into her back .She has afib since 2015 but flecainide pill in pocket has worked until recently. She is now on flecainide 75 mg bid and is doing well without any further afib. She has noticed if she does not use  cpap or it accidentally comes off during the night, it will trigger afib.  She does not drink alcohol, minimal caffeine, no tobacco, no exercise and is morbidly obese.She had a ETT 6/20 walking on treadmill for 4 mins with no arrhythmia during test. Could not meet target heart rate due to  pharmacologic blunting of response to exercise.  Chadsvasc score of  at least 5.    F/u in afib clinic 7/25 and reports no afib, is complaint with flecainide and diltaizem. Taking apixaban without bleeding issues. Discussed that her DM is not well controlled, cholesterol management is not optimal, sheis inactive and overweight. Encouraged weight amnagement class. Reports that she could teach the class, she knows what to do, it is hard to make changes.    Today, she denies symptoms of palpitations, chest pain, shortness of breath, orthopnea, PND, lower extremity edema, dizziness, presyncope, syncope, or neurologic sequela. The patient is tolerating medications without difficulties and is otherwise without complaint today.   Past Medical History:  Diagnosis Date  . Anemia   . Arthritis   . Bilateral carotid artery disease ([place])   . Diabetes mellitus without complication ([place])   . Difficult intubation    states 'lady that did the sleep study told me I have the smallest airway she has ever seen in an adult"  . Dysrhythmia   . Heart murmur   . Hypertension   . Hypothyroid   . Obesity   . Obstructive  sleep apnea    on C Pap  . PAF (paroxysmal atrial fibrillation) ([place])    a. newly dx in 09/2013; on eliquis   Past Surgical History:  Procedure Laterality Date  . CATARACT EXTRACTION Left   . CATARACT EXTRACTION W/PHACO Right 12/09/2015   Procedure: CATARACT EXTRACTION PHACO AND INTRAOCULAR LENS PLACEMENT (IOC);  Surgeon: Emily Branch, MD;  Location: [facility];  Service: Ophthalmology;  Laterality: Right;  CDE:10.48  . COLONOSCOPY W/ POLYPECTOMY    . cyst on back of neck removed    . DILATION AND CURETTAGE OF  UTERUS     x 5  . ENDARTERECTOMY Left 08/30/2015   Procedure: LEFT CAROYID ENDARTERECTOMY WITH XENOSURE BOVINE PERICARDIUM PATCH ANGIOPLASTY;  Surgeon: Emily Mitchell, MD;  Location: [facility];  Service: Vascular;  Laterality: Left;  . EYE SURGERY    . TONSILLECTOMY      Current Outpatient Prescriptions  Medication Sig Dispense Refill  . apixaban (ELIQUIS) 5 MG TABS tablet Take 1 tablet (5 mg total) by mouth 2 (two) times daily. 60 tablet 5  . diltiazem (CARDIZEM) 30 MG tablet Cardizem 30mg  -- take 1 tablet every 4 hours AS NEEDED for heart rate >100 as long as blood pressure >100. 45 tablet 1  . flecainide (TAMBOCOR) 150 MG tablet Take 0.5 tablets (75 mg total) by mouth 2 (two) times daily. 90 tablet 3  . glipiZIDE (GLUCOTROL) 5 MG tablet Take 5 mg by mouth 2 (two) times daily before a meal.     . hydrochlorothiazide (HYDRODIURIL) 25 MG tablet Take 25 mg by mouth daily.    Marland Kitchen ibuprofen (ADVIL,MOTRIN) 200 MG tablet Take 600 mg by mouth daily as needed for moderate pain.    Marland Kitchen levothyroxine (SYNTHROID, LEVOTHROID) 112 MCG tablet Take 56-168 mcg by mouth See admin instructions. TAKES ONE TABLET DAILY. TAKES ONE-HALF TABLET IN ADDITION, EVERY OTHER DAY ONLY    . losartan (COZAAR) 50 MG tablet Take 50 mg by mouth daily.    Marland Kitchen lovastatin (MEVACOR) 20 MG tablet Take 20 mg by mouth every evening.     Marland Kitchen MATZIM LA 180 MG 24 hr tablet TAKE ONE TABLET BY MOUTH ONE TIME DAILY 60 tablet 1  . metFORMIN (GLUCOPHAGE) 500 MG tablet Take 500 mg by mouth 2 (two) times daily.    . metoprolol succinate (TOPROL-XL) 100 MG 24 hr tablet Take 100 mg by mouth daily.    . furosemide (LASIX) 20 MG tablet Take 20 mg by mouth daily as needed for fluid.      No current facility-administered medications for this encounter.     Allergies  Allergen Reactions  . Quinapril Hcl Cough  . Statins Other (See Comments)    PAIN IN LEGS  . Tape Other (See Comments)    Redness, please use "paper" tape    Social History   Social  History  . Marital status: Single    Spouse name: N/A  . Number of children: N/A  . Years of education: N/A   Occupational History  . Not on file.   Social History Main Topics  . Smoking status: Never Smoker  . Smokeless tobacco: Never Used  . Alcohol use No  . Drug use: No  . Sexual activity: Not on file   Other Topics Concern  . Not on file   Social History Narrative  . No narrative on file    Family History  Problem Relation Age of Onset  . COPD Father   . Heart failure Father   .  Heart disease Father   . Arrhythmia Sister   . Arrhythmia Sister   . Arrhythmia Sister     had PPM also  . Cancer Sister     ROS- All systems are reviewed and negative except as per the HPI above  Physical Exam: Vitals:   12/24/15 1017  BP: (!) 160/76  Pulse: (!) 59  Weight: 248 lb 12.8 oz (112.9 kg)  Height: 5\' 3"  (1.6 m)    GEN- The patient is well appearing, alert and oriented x 3 today.   Head- normocephalic, atraumatic Eyes-  Sclera clear, conjunctiva pink Ears- hearing intact Oropharynx- clear Neck- supple, no JVP Lymph- no cervical lymphadenopathy Lungs- Clear to ausculation bilaterally, normal work of breathing Heart- Regular rate and rhythm, no murmurs, rubs or gallops, PMI not laterally displaced GI- soft, NT, ND, + BS Extremities- no clubbing, cyanosis, or edema MS- no significant deformity or atrophy Skin- no rash or lesion Psych- euthymic mood, full affect Neuro- strength and sensation are intact  EKG- SR at 59 bpm, pr int 159 ms, qrs int 100 ms, qtc 437 ms Epic records reviewed  Assessment and Plan: 1. PAF Continue with flecainide 75 mg bid Has 30 mg cardizem  to use as needed for breakthrough episodes Continue apixaban 5 mg bid   2. HTN Poorly controlled She states usally is better at home with sys 140-150 sy Is not restricting sodium in the diet and was encouraged to do so   3.Lifestyle issues contributing to afib Use cpap on a regular  basis Encouraged regular exercise Encouraged weight loss class, but she says she knows what to do, its just the willpower she is missing   F/u in with Dr. Percival [name] December 2017 afib clinic as needed   Geroge Baseman. Melisse Caetano, [facility] [address] [place], [place] [address] [phone]

## 2016-01-07 DIAGNOSIS — E119 Type 2 diabetes mellitus without complications: Secondary | ICD-10-CM | POA: Diagnosis not present

## 2016-01-07 DIAGNOSIS — E039 Hypothyroidism, unspecified: Secondary | ICD-10-CM | POA: Diagnosis not present

## 2016-01-07 DIAGNOSIS — E785 Hyperlipidemia, unspecified: Secondary | ICD-10-CM | POA: Diagnosis not present

## 2016-01-07 DIAGNOSIS — Z780 Asymptomatic menopausal state: Secondary | ICD-10-CM | POA: Diagnosis not present

## 2016-01-07 DIAGNOSIS — I10 Essential (primary) hypertension: Secondary | ICD-10-CM | POA: Diagnosis not present

## 2016-01-07 DIAGNOSIS — Z0000 Encounter for general adult medical examination without abnormal findings: Secondary | ICD-10-CM | POA: Diagnosis not present

## 2016-01-07 DIAGNOSIS — Z13820 Encounter for screening for osteoporosis: Secondary | ICD-10-CM | POA: Diagnosis not present

## 2016-01-07 LAB — HEMOGLOBIN A1C: HEMOGLOBIN A1C: 8.8

## 2016-03-11 ENCOUNTER — Encounter: Payer: Self-pay | Admitting: Physician Assistant

## 2016-03-11 ENCOUNTER — Ambulatory Visit (INDEPENDENT_AMBULATORY_CARE_PROVIDER_SITE_OTHER): Payer: Medicare Other | Admitting: Physician Assistant

## 2016-03-11 VITALS — BP 121/63 | HR 56 | Temp 96.6°F | Ht 63.0 in | Wt 248.0 lb

## 2016-03-11 DIAGNOSIS — I6523 Occlusion and stenosis of bilateral carotid arteries: Secondary | ICD-10-CM | POA: Diagnosis not present

## 2016-03-11 DIAGNOSIS — R1030 Lower abdominal pain, unspecified: Secondary | ICD-10-CM

## 2016-03-11 DIAGNOSIS — N3001 Acute cystitis with hematuria: Secondary | ICD-10-CM | POA: Diagnosis not present

## 2016-03-11 LAB — MICROSCOPIC EXAMINATION: WBC, UA: >30 /hpf — AB (ref 0–?)

## 2016-03-11 LAB — URINALYSIS, COMPLETE
Bilirubin, UA: NEGATIVE
Glucose, UA: NEGATIVE
NITRITE UA: NEGATIVE
PH UA: 5.5 (ref 5.0–7.5)
Urobilinogen, Ur: 0.2 mg/dL (ref 0.2–1.0)

## 2016-03-11 MED ORDER — SULFAMETHOXAZOLE-TRIMETHOPRIM 800-160 MG PO TABS: 1.0000 | ORAL_TABLET | Freq: Two times a day (BID) | ORAL | 0 refills | Status: DC

## 2016-03-13 NOTE — Patient Instructions (Signed)

## 2016-03-13 NOTE — Progress Notes (Signed)
BP 121/63   Pulse (!) 56   Temp (!) 96.6 F (35.9 C) (Oral)   Ht 5\' 3"  (1.6 m)   Wt 248 lb (112.5 kg)   BMI 43.93 kg/m    Subjective:    Patient ID: Emily [name], female    DOB: September 05, 1950, 65 y.o.   MRN: [id number]  HPI: Emily [name] is a 65 y.o. female presenting on 03/11/2016 for  Chief Complaint  Patient presents with  . Urinary Tract Infection    Patient has had 3 days of bladder symptoms. She has increased pressure over her bladder. She states it does not go up into her back or flank area. She has had frequency at night. She does have dysuria when she goes.  Relevant past medical, surgical, family and social history reviewed and updated as indicated. Allergies and medications reviewed and updated.  Past Medical History:  Diagnosis Date  . Anemia   . Arthritis   . Bilateral carotid artery disease ([place])   . Diabetes mellitus without complication ([place])   . Difficult intubation    states 'lady that did the sleep study told me I have the smallest airway she has ever seen in an adult"  . Dysrhythmia   . Heart murmur   . Hypertension   . Hypothyroid   . Obesity   . Obstructive sleep apnea    on C Pap  . PAF (paroxysmal atrial fibrillation) ([place])    a. newly dx in 09/2013; on eliquis    Past Surgical History:  Procedure Laterality Date  . CATARACT EXTRACTION Left   . CATARACT EXTRACTION W/PHACO Right 12/09/2015   Procedure: CATARACT EXTRACTION PHACO AND INTRAOCULAR LENS PLACEMENT (IOC);  Surgeon: Tonny Branch, MD;  Location: [facility];  Service: Ophthalmology;  Laterality: Right;  CDE:10.48  . COLONOSCOPY W/ POLYPECTOMY    . cyst on back of neck removed    . DILATION AND CURETTAGE OF UTERUS     x 5  . ENDARTERECTOMY Left 08/30/2015   Procedure: LEFT CAROYID ENDARTERECTOMY WITH XENOSURE BOVINE PERICARDIUM PATCH ANGIOPLASTY;  Surgeon: [name], MD;  Location: [facility];  Service: Vascular;  Laterality: Left;  . EYE SURGERY    . TONSILLECTOMY      Review of  Systems  Constitutional: Negative.  Negative for chills and fever.  HENT: Negative.   Eyes: Negative.   Respiratory: Negative.   Gastrointestinal: Negative.   Genitourinary: Positive for difficulty urinating, dysuria and urgency. Negative for flank pain.      Medication List       Accurate as of 03/11/16 11:59 PM. Always use your most recent med list.          apixaban 5 MG Tabs tablet Commonly known as:  ELIQUIS Take 1 tablet (5 mg total) by mouth 2 (two) times daily.   diltiazem 30 MG tablet Commonly known as:  CARDIZEM Cardizem 30mg  -- take 1 tablet every 4 hours AS NEEDED for heart rate >100 as long as blood pressure >100.   flecainide 150 MG tablet Commonly known as:  TAMBOCOR Take 0.5 tablets (75 mg total) by mouth 2 (two) times daily.   glipiZIDE 5 MG tablet Commonly known as:  GLUCOTROL Take 5 mg by mouth 2 (two) times daily before a meal.   hydrochlorothiazide 25 MG tablet Commonly known as:  HYDRODIURIL Take 25 mg by mouth daily.   ibuprofen 200 MG tablet Commonly known as:  ADVIL,MOTRIN Take 600 mg by mouth daily as needed for moderate pain.  LASIX 20 MG tablet Generic drug:  furosemide Take 20 mg by mouth daily as needed for fluid.   levothyroxine 112 MCG tablet Commonly known as:  SYNTHROID, LEVOTHROID Take 56-168 mcg by mouth See admin instructions. TAKES ONE TABLET DAILY. TAKES ONE-HALF TABLET IN ADDITION, EVERY OTHER DAY ONLY   losartan 50 MG tablet Commonly known as:  COZAAR Take 50 mg by mouth daily.   lovastatin 20 MG tablet Commonly known as:  MEVACOR Take 20 mg by mouth every evening.   MATZIM LA 180 MG 24 hr tablet Generic drug:  diltiazem TAKE ONE TABLET BY MOUTH ONE TIME DAILY   metFORMIN 500 MG tablet Commonly known as:  GLUCOPHAGE Take 500 mg by mouth 2 (two) times daily.   metoprolol succinate 100 MG 24 hr tablet Commonly known as:  TOPROL-XL Take 100 mg by mouth daily.   sulfamethoxazole-trimethoprim 800-160 MG  tablet Commonly known as:  BACTRIM DS Take 1 tablet by mouth 2 (two) times daily.          Objective:    BP 121/63   Pulse (!) 56   Temp (!) 96.6 F (35.9 C) (Oral)   Ht 5\' 3"  (1.6 m)   Wt 248 lb (112.5 kg)   BMI 43.93 kg/m   Allergies  Allergen Reactions  . Quinapril Hcl Cough  . Statins Other (See Comments)    PAIN IN LEGS  . Tape Other (See Comments)    Redness, please use "paper" tape    Physical Exam  Constitutional: She is oriented to person, place, and time. She appears well-developed and well-nourished.  HENT:  Head: Normocephalic and atraumatic.  Eyes: Conjunctivae are normal. Pupils are equal, round, and reactive to light.  Cardiovascular: Normal rate, regular rhythm, normal heart sounds and intact distal pulses.   Pulmonary/Chest: Effort normal and breath sounds normal.  Abdominal: Soft. Bowel sounds are normal. She exhibits no distension and no mass. There is tenderness in the suprapubic area. There is no rebound, no guarding and no CVA tenderness.  Neurological: She is alert and oriented to person, place, and time. She has normal reflexes.  Skin: Skin is warm and dry. No rash noted.  Psychiatric: She has a normal mood and affect. Her behavior is normal. Judgment and thought content normal.    Results for orders placed or performed in visit on 03/11/16  Microscopic Examination  Result Value Ref Range   WBC, UA >30 (A) 0 - 5 /hpf   RBC, UA 11-30 (A) 0 - 2 /hpf   Epithelial Cells (non renal) 0-10 0 - 10 /hpf   Renal Epithel, UA 0-10 (A) None seen /hpf   Bacteria, UA Moderate (A) None seen/Few  Urinalysis, Complete  Result Value Ref Range   Specific Gravity, UA >1.030 (H) 1.005 - 1.030   pH, UA 5.5 5.0 - 7.5   Color, UA Amber (A) Yellow   Appearance Ur Cloudy (A) Clear   Leukocytes, UA 1+ (A) Negative   Protein, UA 2+ (A) Negative/Trace   Glucose, UA Negative Negative   Ketones, UA Trace (A) Negative   RBC, UA 3+ (A) Negative   Bilirubin, UA  Negative Negative   Urobilinogen, Ur 0.2 0.2 - 1.0 mg/dL   Nitrite, UA Negative Negative   Microscopic Examination See below:       Assessment & Plan:   1. Lower abdominal pain - Urinalysis, Complete  2. Acute cystitis with hematuria - sulfamethoxazole-trimethoprim (BACTRIM DS) 800-160 MG tablet; Take 1 tablet by mouth 2 (  two) times daily.  Dispense: 20 tablet; Refill: 0 - Urine culture   Continue all other maintenance medications as listed above.  Follow up plan: Prn return if not improved.  Orders Placed This Encounter  Procedures  . Urine culture  . Microscopic Examination  . Urinalysis, Complete    Educational handout given for uti.  Terald Sleeper PA-C [facility] [address]  [place], [place] [address] [phone]   03/13/2016, 1:51 PM

## 2016-03-14 LAB — URINE CULTURE

## 2016-03-17 DIAGNOSIS — Z862 Personal history of diseases of the blood and blood-forming organs and certain disorders involving the immune mechanism: Secondary | ICD-10-CM | POA: Diagnosis not present

## 2016-03-17 DIAGNOSIS — E039 Hypothyroidism, unspecified: Secondary | ICD-10-CM | POA: Diagnosis not present

## 2016-03-23 ENCOUNTER — Other Ambulatory Visit: Payer: Self-pay | Admitting: Cardiology

## 2016-03-23 ENCOUNTER — Telehealth: Payer: Self-pay | Admitting: Family Medicine

## 2016-03-23 NOTE — Telephone Encounter (Signed)
Rx request sent to pharmacy.  

## 2016-03-23 NOTE — Telephone Encounter (Signed)
Patient advised that we have not sent any antibiotics since 03/11/2016. The antibiotic that she has was filled on 03/17/16 and she was advised that she did not need to take. We have no documentation or record of antibiotic ordered on 10/17.

## 2016-03-24 DIAGNOSIS — Z1231 Encounter for screening mammogram for malignant neoplasm of breast: Secondary | ICD-10-CM | POA: Diagnosis not present

## 2016-03-24 DIAGNOSIS — Z01419 Encounter for gynecological examination (general) (routine) without abnormal findings: Secondary | ICD-10-CM | POA: Diagnosis not present

## 2016-04-02 ENCOUNTER — Encounter: Payer: Self-pay | Admitting: Pediatrics

## 2016-04-02 ENCOUNTER — Ambulatory Visit (INDEPENDENT_AMBULATORY_CARE_PROVIDER_SITE_OTHER): Payer: Medicare Other | Admitting: Pediatrics

## 2016-04-02 VITALS — BP 147/66 | HR 55 | Temp 96.9°F | Ht 63.0 in | Wt 249.6 lb

## 2016-04-02 DIAGNOSIS — E119 Type 2 diabetes mellitus without complications: Secondary | ICD-10-CM | POA: Diagnosis not present

## 2016-04-02 DIAGNOSIS — T148XXA Other injury of unspecified body region, initial encounter: Secondary | ICD-10-CM

## 2016-04-02 DIAGNOSIS — I10 Essential (primary) hypertension: Secondary | ICD-10-CM | POA: Diagnosis not present

## 2016-04-02 DIAGNOSIS — I6523 Occlusion and stenosis of bilateral carotid arteries: Secondary | ICD-10-CM | POA: Diagnosis not present

## 2016-04-02 DIAGNOSIS — S91301A Unspecified open wound, right foot, initial encounter: Secondary | ICD-10-CM | POA: Diagnosis not present

## 2016-04-02 NOTE — Progress Notes (Signed)
  Subjective:   Patient ID: Emily [name], female    DOB: 06/27/1950, 65 y.o.   MRN: [id number] CC: Open Wound (right foot)  HPI: Emily [name] is a 65 y.o. female presenting for Open Wound (right foot)  4 nights ago pulled some skin off of the bottom of her foot and it bled slightly Tuesday night didn't bother her No discharge  HTN: BP at home 120s No lightheadedness, no dizziness No HA, no chest pain  DM2: followed by Family medicine in [facility] Recent A1c 12/2015 was 8.8, started on metformin Has follow up appt in Jan with them  Relevant past medical, surgical, family and social history reviewed. Allergies and medications reviewed and updated. History  Smoking Status  . Never Smoker  Smokeless Tobacco  . Never Used   ROS: Per HPI   Objective:    BP (!) 147/66   Pulse (!) 55   Temp (!) 96.9 F (36.1 C) (Oral)   Ht 5\' 3"  (1.6 m)   Wt 249 lb 9 oz (113.2 kg)   BMI 44.21 kg/m   Wt Readings from Last 3 Encounters:  04/02/16 249 lb 9 oz (113.2 kg)  03/11/16 248 lb (112.5 kg)  12/24/15 248 lb 12.8 oz (112.9 kg)    Gen: NAD, alert, cooperative with exam, NCAT EYES: EOMI, no conjunctival injection, or no icterus CV: NRRR, normal 99991111, systolic ejection murmur, DP distal pulses 2+ b/l Resp: CTABL, no wheezes, normal WOB Ext: Non-pitting edema b/l, warm Neuro: Alert and oriented, strength equal b/l UE and LE, coordination grossly normal Skin: R heel with 63mm abrasion of first layer of skin, lower layers intact, small 9mm superficial scab. No surrounding redness. Slightly tender with palpation over area, no swelling or fluctuance  Assessment & Plan:  Emily [name] was seen today for open wound.  Diagnoses and all orders for this visit:  Abrasion Well healing, very superificial, no redness  Essential hypertension Elevated systolic today, Diastolic low No symptoms Recently [id number] systolic Usually follows with FM at [facility] Pt to follow up with them  Type 2 diabetes  mellitus without complication, unspecified long term insulin use status (HCC) A1c 8.8 in 12/2015 Added metformin Has f/u with PCP  Follow up plan: Return if symptoms worsen or fail to improve. Assunta Found, MD [place]

## 2016-04-21 DIAGNOSIS — M25561 Pain in right knee: Secondary | ICD-10-CM | POA: Diagnosis not present

## 2016-04-21 DIAGNOSIS — I10 Essential (primary) hypertension: Secondary | ICD-10-CM | POA: Diagnosis not present

## 2016-04-21 DIAGNOSIS — M1711 Unilateral primary osteoarthritis, right knee: Secondary | ICD-10-CM | POA: Diagnosis not present

## 2016-05-05 ENCOUNTER — Encounter: Payer: Self-pay | Admitting: Family Medicine

## 2016-05-05 ENCOUNTER — Ambulatory Visit (INDEPENDENT_AMBULATORY_CARE_PROVIDER_SITE_OTHER): Payer: Medicare Other | Admitting: Family Medicine

## 2016-05-05 VITALS — BP 153/61 | HR 68 | Temp 97.4°F | Ht 63.0 in | Wt 237.4 lb

## 2016-05-05 DIAGNOSIS — I6523 Occlusion and stenosis of bilateral carotid arteries: Secondary | ICD-10-CM | POA: Diagnosis not present

## 2016-05-05 DIAGNOSIS — M25561 Pain in right knee: Secondary | ICD-10-CM

## 2016-05-05 NOTE — Progress Notes (Signed)
   HPI  Patient presents today here with right knee pain.  Patient explains she has a 2 week history of right leg pain. It starts in the knee and goes up and down from there. She has most significant pain while laying in bed, it improves with standing up.  Patient explains her history is being diagnosed with psoriatic arthritis 10 years ago, however she did well after that and has only had a few flares over the years.  2 weeks ago she was seen by her family physician, Dr. Collene Gobble, who sent her to sports medicine physician. She had an x-ray which showed bone spurs. She was treated with a cortisone joint injection.  She called in a few days ago to her family physician who sent her a prescription of tramadol and Voltaren gel. She's getting only minimal improvement for this.  She had some old Percocet which helped her pain., However she has run out. She has taken some hydrocodone given by her niece which has helped.  PMH: Smoking status noted ROS: Per HPI  Objective: BP (!) 153/61   Pulse 68   Temp 97.4 F (36.3 C) (Oral)   Ht 5\' 3"  (1.6 m)   Wt 237 lb 6.4 oz (107.7 kg)   BMI 42.05 kg/m  Gen: NAD, alert, cooperative with exam HEENT: NCAT CV: RRR, good S1/S2, no murmur Resp: CTABL, no wheezes, non-labored Ext: No edema, warm Neuro: Alert and oriented, No gross deficits  MSK: R knee without erythema, effusion, bruising, or gross deformity + medial  joint line tenderness.  ligamentously intact to Lachman's and with varus and valgus stress.  Negative McMurray's test   Assessment and plan:  # Right knee pain Unclear etiology, however with mostly nighttime pain and improvement with walking, I suspect underlying flare of psoriatic arthritis. Patient has been treated with cortisone joint injection. Recommended trying 100 mg a tramadol at night Declined request for additional narcotics. I also discussed with patient trying to follow-up with the same physician, either here or  with her longtime family physician.     Laroy Apple, MD [facility] 05/05/2016, 8:18 AM

## 2016-05-19 DIAGNOSIS — M47816 Spondylosis without myelopathy or radiculopathy, lumbar region: Secondary | ICD-10-CM | POA: Diagnosis not present

## 2016-05-19 DIAGNOSIS — M79651 Pain in right thigh: Secondary | ICD-10-CM | POA: Diagnosis not present

## 2016-05-19 DIAGNOSIS — Z862 Personal history of diseases of the blood and blood-forming organs and certain disorders involving the immune mechanism: Secondary | ICD-10-CM | POA: Diagnosis not present

## 2016-05-19 DIAGNOSIS — M79661 Pain in right lower leg: Secondary | ICD-10-CM | POA: Diagnosis not present

## 2016-05-19 DIAGNOSIS — M898X6 Other specified disorders of bone, lower leg: Secondary | ICD-10-CM | POA: Diagnosis not present

## 2016-05-26 DIAGNOSIS — I10 Essential (primary) hypertension: Secondary | ICD-10-CM | POA: Diagnosis not present

## 2016-05-26 DIAGNOSIS — S86111A Strain of other muscle(s) and tendon(s) of posterior muscle group at lower leg level, right leg, initial encounter: Secondary | ICD-10-CM | POA: Diagnosis not present

## 2016-06-03 ENCOUNTER — Encounter: Payer: Self-pay | Admitting: Pediatrics

## 2016-06-03 ENCOUNTER — Ambulatory Visit (INDEPENDENT_AMBULATORY_CARE_PROVIDER_SITE_OTHER): Payer: Medicare Other | Admitting: Pediatrics

## 2016-06-03 VITALS — BP 151/63 | HR 67 | Temp 97.6°F | Resp 18 | Ht 63.0 in | Wt 241.6 lb

## 2016-06-03 DIAGNOSIS — M25579 Pain in unspecified ankle and joints of unspecified foot: Secondary | ICD-10-CM | POA: Insufficient documentation

## 2016-06-03 DIAGNOSIS — J069 Acute upper respiratory infection, unspecified: Secondary | ICD-10-CM

## 2016-06-03 DIAGNOSIS — M25571 Pain in right ankle and joints of right foot: Secondary | ICD-10-CM

## 2016-06-03 DIAGNOSIS — I10 Essential (primary) hypertension: Secondary | ICD-10-CM

## 2016-06-03 DIAGNOSIS — J209 Acute bronchitis, unspecified: Secondary | ICD-10-CM | POA: Diagnosis not present

## 2016-06-03 MED ORDER — AMOXICILLIN 500 MG PO CAPS: 500.0000 mg | ORAL_CAPSULE | Freq: Two times a day (BID) | ORAL | 0 refills | Status: DC

## 2016-06-03 NOTE — Progress Notes (Signed)
  Subjective:   Patient ID: Emily [name], female    DOB: 11/06/1950, 66 y.o.   MRN: [id number] CC: Chest Congestion (1 week) and Cough (1 week)  HPI: Emily [name] is a 66 y.o. female presenting for Chest Congestion (1 week) and Cough (1 week)  Started getting cough about a week ago Has been traveling hasnt felt bad at all during illness Some nasal congestion, not bothering her much No fevers Thinks cough is getting deeper No sore throat Appetite has been ok R LE with pain in ankle, ongoing, some swelling Been followed by sports medicine for this  Says always has high bp at doctors office Has not checked at home in months Has been high at home in the past as well, both top number and bottom number On several BP meds, takes regularly No SOB, no CP, no HA   Relevant past medical, surgical, family and social history reviewed. Allergies and medications reviewed and updated. History  Smoking Status  . Never Smoker  Smokeless Tobacco  . Never Used   ROS: Per HPI   Objective:    BP (!) 151/63   Pulse 67   Temp 97.6 F (36.4 C) (Oral)   Resp 18   Ht 5\' 3"  (1.6 m)   Wt 241 lb 9.6 oz (109.6 kg)   SpO2 98%   BMI 42.80 kg/m   Wt Readings from Last 3 Encounters:  06/03/16 241 lb 9.6 oz (109.6 kg)  05/05/16 237 lb 6.4 oz (107.7 kg)  04/02/16 249 lb 9 oz (113.2 kg)    Gen: NAD, alert, cooperative with exam, NCAT EYES: EOMI, no conjunctival injection, or no icterus ENT:  TMs pearly gray b/l, OP without erythema LYMPH: no cervical LAD CV: NRRR, normal 99991111, systolic ejection murmur II/VI Resp: CTABL, no wheezes, normal WOB Ext: R ankle 1+ pitting edema, L ankle trace pitting edema Neuro: Alert and oriented MSK: normal muscle bulk  Assessment & Plan:  [name] was seen today for chest congestion and cough.  Diagnoses and all orders for this visit:  Acute URI Discussed symptomatic care If not improving over next few days start below  Acute bronchitis,  unspecified organism -     amoxicillin (AMOXIL) 500 MG capsule; Take 1 capsule (500 mg total) by mouth 2 (two) times daily.  Essential hypertension Elevated today Check at home, bring numbers to next office visit, here or with PCP Cont current meds  Pain in joint involving right ankle and foot Cont follow up with sports medicine  Follow up plan: Return if symptoms worsen or fail to improve. Assunta Found, MD [place]

## 2016-06-09 DIAGNOSIS — Z7984 Long term (current) use of oral hypoglycemic drugs: Secondary | ICD-10-CM | POA: Diagnosis not present

## 2016-06-09 DIAGNOSIS — H43393 Other vitreous opacities, bilateral: Secondary | ICD-10-CM | POA: Diagnosis not present

## 2016-06-09 DIAGNOSIS — Z961 Presence of intraocular lens: Secondary | ICD-10-CM | POA: Diagnosis not present

## 2016-06-09 DIAGNOSIS — E119 Type 2 diabetes mellitus without complications: Secondary | ICD-10-CM | POA: Diagnosis not present

## 2016-06-22 ENCOUNTER — Ambulatory Visit: Payer: Medicare Other | Admitting: Surgery

## 2016-06-22 ENCOUNTER — Ambulatory Visit (HOSPITAL_COMMUNITY): Payer: Medicare Other

## 2016-06-25 ENCOUNTER — Encounter: Payer: Self-pay | Admitting: Family

## 2016-06-25 ENCOUNTER — Ambulatory Visit (INDEPENDENT_AMBULATORY_CARE_PROVIDER_SITE_OTHER): Payer: Medicare Other | Admitting: Family

## 2016-06-25 VITALS — BP 158/66 | HR 69 | Temp 99.1°F | Ht 63.0 in | Wt 241.2 lb

## 2016-06-25 DIAGNOSIS — J209 Acute bronchitis, unspecified: Secondary | ICD-10-CM | POA: Diagnosis not present

## 2016-06-25 MED ORDER — BENZONATATE 200 MG PO CAPS: 200.0000 mg | ORAL_CAPSULE | Freq: Three times a day (TID) | ORAL | 1 refills | Status: DC | PRN

## 2016-06-25 MED ORDER — DOXYCYCLINE HYCLATE 100 MG PO TABS: 100.0000 mg | ORAL_TABLET | Freq: Two times a day (BID) | ORAL | 0 refills | Status: DC

## 2016-06-25 NOTE — Progress Notes (Signed)
Subjective:    Patient ID: Emily [name], female    DOB: Dec 07, 1950, 66 y.o.   MRN: [id number]  Cough  This is a recurrent problem. The current episode started 1 to 4 weeks ago. The problem has been gradually worsening. The problem occurs every few minutes. The cough is productive of sputum. Associated symptoms include chills, a fever, myalgias, nasal congestion, postnasal drip and rhinorrhea. Pertinent negatives include no ear congestion, ear pain, headaches, sore throat, shortness of breath or wheezing. The symptoms are aggravated by lying down. She has tried rest and OTC cough suppressant for the symptoms. The treatment provided mild relief. There is no history of asthma or COPD.      Review of Systems  Constitutional: Positive for chills and fever.  HENT: Positive for postnasal drip and rhinorrhea. Negative for ear pain and sore throat.   Respiratory: Positive for cough. Negative for shortness of breath and wheezing.   Musculoskeletal: Positive for myalgias.  Neurological: Negative for headaches.  All other systems reviewed and are negative.      Objective:   Physical Exam  Constitutional: She is oriented to person, place, and time. She appears well-developed and well-nourished. No distress.  HENT:  Head: Normocephalic and atraumatic.  Right Ear: External ear normal.  Left Ear: External ear normal.  Nose: Mucosal edema and rhinorrhea present.  Mouth/Throat: Posterior oropharyngeal erythema present.  Eyes: Pupils are equal, round, and reactive to light.  Neck: Normal range of motion. Neck supple. No thyromegaly present.  Cardiovascular: Normal rate, regular rhythm, normal heart sounds and intact distal pulses.   No murmur heard. Pulmonary/Chest: Effort normal and breath sounds normal. No respiratory distress. She has no wheezes.  Intermittent coarse productive cough   Abdominal: Soft. Bowel sounds are normal. She exhibits no distension. There is no tenderness.    Musculoskeletal: Normal range of motion. She exhibits no edema or tenderness.  Neurological: She is alert and oriented to person, place, and time. She has normal reflexes. No cranial nerve deficit.  Skin: Skin is warm and dry.  Psychiatric: She has a normal mood and affect. Her behavior is normal. Judgment and thought content normal.  Vitals reviewed.     BP (!) 158/66 (BP Location: Left Arm, Patient Position: Sitting, Cuff Size: Normal)   Pulse 69   Temp 99.1 F (37.3 C) (Oral)   Ht 5\' 3"  (1.6 m)   Wt 241 lb 3.2 oz (109.4 kg)   BMI 42.73 kg/m      Assessment & Plan:  1. Acute bronchitis, unspecified organism - Take meds as prescribed - Use a cool mist humidifier  -Use saline nose sprays frequently -Saline irrigations of the nose can be very helpful if done frequently.  * 4X daily for 1 week*  * Use of a nettie pot can be helpful with this. Follow directions with this* -Force fluids -For any cough or congestion  Use plain Mucinex- regular strength or max strength is fine   * Children- consult with Pharmacist for dosing -For fever or aces or pains- take tylenol or ibuprofen appropriate for age and weight.  * for fevers greater than 101 orally you may alternate ibuprofen and tylenol every  3 hours. -Throat lozenges if help -New toothbrush in 3 days - doxycycline (VIBRA-TABS) 100 MG tablet; Take 1 tablet (100 mg total) by mouth 2 (two) times daily.  Dispense: 20 tablet; Refill: 0 - benzonatate (TESSALON) 200 MG capsule; Take 1 capsule (200 mg total) by mouth 3 (three) times daily  as needed.  Dispense: 30 capsule; Refill: [place], FNP

## 2016-06-25 NOTE — Patient Instructions (Signed)

## 2016-07-14 DIAGNOSIS — I10 Essential (primary) hypertension: Secondary | ICD-10-CM | POA: Diagnosis not present

## 2016-07-14 DIAGNOSIS — E119 Type 2 diabetes mellitus without complications: Secondary | ICD-10-CM | POA: Diagnosis not present

## 2016-07-14 DIAGNOSIS — D500 Iron deficiency anemia secondary to blood loss (chronic): Secondary | ICD-10-CM | POA: Diagnosis not present

## 2016-07-14 DIAGNOSIS — E039 Hypothyroidism, unspecified: Secondary | ICD-10-CM | POA: Diagnosis not present

## 2016-07-14 DIAGNOSIS — I480 Paroxysmal atrial fibrillation: Secondary | ICD-10-CM | POA: Diagnosis not present

## 2016-07-28 ENCOUNTER — Encounter: Payer: Self-pay | Admitting: Surgery

## 2016-07-30 ENCOUNTER — Encounter: Payer: Self-pay | Admitting: Cardiology

## 2016-08-03 ENCOUNTER — Ambulatory Visit (INDEPENDENT_AMBULATORY_CARE_PROVIDER_SITE_OTHER): Payer: Medicare Other | Admitting: Surgery

## 2016-08-03 ENCOUNTER — Encounter: Payer: Self-pay | Admitting: Surgery

## 2016-08-03 ENCOUNTER — Ambulatory Visit (HOSPITAL_COMMUNITY)
Admission: RE | Admit: 2016-08-03 | Discharge: 2016-08-03 | Disposition: A | Discharge status: Home / Self Care | Payer: Medicare Other | Source: Ambulatory Visit | Attending: Surgery | Admitting: Surgery

## 2016-08-03 VITALS — BP 164/71 | HR 56 | Temp 97.5°F | Resp 16 | Ht 63.0 in | Wt 240.0 lb

## 2016-08-03 DIAGNOSIS — I6523 Occlusion and stenosis of bilateral carotid arteries: Secondary | ICD-10-CM

## 2016-08-03 DIAGNOSIS — Z9889 Other specified postprocedural states: Secondary | ICD-10-CM | POA: Insufficient documentation

## 2016-08-03 DIAGNOSIS — I6521 Occlusion and stenosis of right carotid artery: Secondary | ICD-10-CM | POA: Insufficient documentation

## 2016-08-03 NOTE — Progress Notes (Signed)
Vascular and Vein Specialist of [facility]  Patient name: Emily [name] MRN: [id number] DOB: 09-16-1950 Sex: female   REASON FOR VISIT:    Follow up carotid  HISOTRY OF PRESENT ILLNESS:   Emily [name] is a 66 y.o. female who is status post left carotid endarterectomy with bovine pericardial patch angioplasty on 3 07/31/2015.  This was done for asymptomatic bilateral carotid stenosis, left greater than right.  Intraoperative findings included a 85% stenosis.  Her postoperative follow-up was uncomplicated and she was discharged to home the following day.She stated that her left middle finger was a little numb after the procedure.  She was a very difficult A-line.  This has improved.  She reports no new neurologic symptoms.  She has had pain in her right leg which has been attributed to musculoskeletal.   PAST MEDICAL HISTORY:   Past Medical History:  Diagnosis Date  . Anemia   . Arthritis   . Bilateral carotid artery disease ([place])   . Diabetes mellitus without complication ([place])   . Difficult intubation    states 'lady that did the sleep study told me I have the smallest airway she has ever seen in an adult"  . Dysrhythmia   . Heart murmur   . Hypertension   . Hypothyroid   . Obesity   . Obstructive sleep apnea    on C Pap  . PAF (paroxysmal atrial fibrillation) ([place])    a. newly dx in 09/2013; on eliquis     FAMILY HISTORY:   Family History  Problem Relation Age of Onset  . COPD Father   . Heart failure Father   . Heart disease Father   . Arrhythmia Sister   . Arrhythmia Sister   . Arrhythmia Sister     had PPM also  . Cancer Sister     SOCIAL HISTORY:   Social History  Substance Use Topics  . Smoking status: Never Smoker  . Smokeless tobacco: Never Used  . Alcohol use No     ALLERGIES:   Allergies  Allergen Reactions  . Quinapril Hcl Cough  . Statins Other (See Comments)    Not all Statins but some cause  cough and pain in legs.  . Tape Other (See Comments)    Redness, please use "paper" tape     CURRENT MEDICATIONS:   Current Outpatient Prescriptions  Medication Sig Dispense Refill  . apixaban (ELIQUIS) 5 MG TABS tablet Take 1 tablet (5 mg total) by mouth 2 (two) times daily. 60 tablet 5  . benzonatate (TESSALON) 200 MG capsule Take 1 capsule (200 mg total) by mouth 3 (three) times daily as needed. 30 capsule 1  . diclofenac sodium (VOLTAREN) 1 % GEL Apply 2 gm to the affected joint 4 times daily as needed.    . diltiazem (CARDIZEM) 30 MG tablet Cardizem 30mg  -- take 1 tablet every 4 hours AS NEEDED for heart rate >100 as long as blood pressure >100. 45 tablet 1  . doxycycline (VIBRA-TABS) 100 MG tablet Take 1 tablet (100 mg total) by mouth 2 (two) times daily. 20 tablet 0  . flecainide (TAMBOCOR) 150 MG tablet Take 0.5 tablets (75 mg total) by mouth 2 (two) times daily. 90 tablet 3  . hydrochlorothiazide (HYDRODIURIL) 25 MG tablet Take 25 mg by mouth daily.    Marland Kitchen ibuprofen (ADVIL,MOTRIN) 200 MG tablet Take 600 mg by mouth daily as needed for moderate pain.    Marland Kitchen levothyroxine (SYNTHROID, LEVOTHROID) 112 MCG tablet Take 56-168 mcg by  mouth See admin instructions. TAKES ONE TABLET DAILY. TAKES ONE-HALF TABLET IN ADDITION, EVERY OTHER DAY ONLY    . losartan (COZAAR) 50 MG tablet Take 50 mg by mouth daily.    Marland Kitchen lovastatin (MEVACOR) 20 MG tablet Take 20 mg by mouth every evening.     Marland Kitchen MATZIM LA 180 MG 24 hr tablet TAKE ONE TABLET BY MOUTH ONE TIME DAILY 60 tablet 6  . metFORMIN (GLUCOPHAGE) 500 MG tablet Take 500 mg by mouth 2 (two) times daily.    . metoprolol succinate (TOPROL-XL) 100 MG 24 hr tablet Take 100 mg by mouth daily.    Marland Kitchen SLOW IRON 160 (50 Fe) MG TBCR SR tablet Take 1 tablet by mouth daily.  3  . traMADol (ULTRAM) 50 MG tablet One twice a day when necessary pain    . furosemide (LASIX) 20 MG tablet Take 20 mg by mouth daily as needed for fluid.     Marland Kitchen glipiZIDE (GLUCOTROL) 5 MG  tablet Take 5 mg by mouth 2 (two) times daily before a meal.      No current facility-administered medications for this visit.     REVIEW OF SYSTEMS:   [X]  denotes positive finding, [ ]  denotes negative finding Cardiac  Comments:  Chest pain or chest pressure:    Shortness of breath upon exertion:    Short of breath when lying flat:    Irregular heart rhythm:        Vascular    Pain in calf, thigh, or hip brought on by ambulation:    Pain in feet at night that wakes you up from your sleep:     Blood clot in your veins:    Leg swelling:         Pulmonary    Oxygen at home:    Productive cough:     Wheezing:         Neurologic    Sudden weakness in arms or legs:     Sudden numbness in arms or legs:     Sudden onset of difficulty speaking or slurred speech:    Temporary loss of vision in one eye:     Problems with dizziness:         Gastrointestinal    Blood in stool:     Vomited blood:         Genitourinary    Burning when urinating:     Blood in urine:        Psychiatric    Major depression:         Hematologic    Bleeding problems:    Problems with blood clotting too easily:        Skin    Rashes or ulcers:        Constitutional    Fever or chills:      PHYSICAL EXAM:   Vitals:   08/03/16 0941 08/03/16 0950  BP: (!) 147/64 (!) 164/71  Pulse: (!) 56   Resp: 16   Temp: 97.5 F (36.4 C)   TempSrc: Oral   SpO2: 98%   Weight: 240 lb (108.9 kg)   Height: 5\' 3"  (1.6 m)     GENERAL: The patient is a well-nourished female, in no acute distress. The vital signs are documented above. CARDIAC: There is a regular rate and rhythm.  VASCULAR: No carotid bruits.  Bilateral lower extremity edema.  Biphasic right posterior tibial and dorsalis pedis PULMONARY: Non-labored respirations MUSCULOSKELETAL: There are no major deformities or cyanosis.  NEUROLOGIC: No focal weakness or paresthesias are detected. SKIN: There are no ulcers or rashes noted. PSYCHIATRIC:  The patient has a normal affect.  STUDIES:   I have ordered and reviewed her vascular lab studies.  The right side of stenosis remains in the 60-79% category.  The left endarterectomy site is widely patent  MEDICAL ISSUES:   Status post left carotid endarterectomy for asymptomatic stenosis: The patient remains neurologically intact.  She has stable 60-79% right carotid stenosis which is also asymptomatic.  We will continue with duplex ultrasound surveillance in 6 months.    Annamarie Major, MD Vascular and Vein Specialists of [facility] [phone] Pager [phone]

## 2016-08-04 NOTE — Progress Notes (Signed)
HPI The patient presents for followup of atrial fib.  The patient has paroxysmal atrial fibrillation and is being treated with when necessary flecainide. Since I last saw her she has had recurrent atrial fib.  she has had a few ED visits for this.  She was seen in our office and she has had a few appointments in the atrial fib clinic.  She has also had CEA since I saw her.  She returns for follow up.  She tells me that she has had some mild anemia and her PCP was thinking of sending her to GI but he is now retiring.  She has not had any evidence of GI bleeding by her report.  She has had no palpitations since starting daily flecainide.  She is limited by joint pain.  She does work as a Theme park manager.  The patient denies any new symptoms such as chest discomfort, neck or arm discomfort. There has been no new shortness of breath, PND or orthopnea. There have been no reported palpitations, presyncope or syncope.  Allergies  Allergen Reactions  . Quinapril Hcl Cough  . Statins Other (See Comments)    Not all Statins but some cause cough and pain in legs.  . Tape Other (See Comments)    Redness, please use "paper" tape    Current Outpatient Prescriptions  Medication Sig Dispense Refill  . atorvastatin (LIPITOR) 80 MG tablet Take 80 mg by mouth daily.    Marland Kitchen diltiazem (CARDIZEM) 30 MG tablet Take 30 mg by mouth daily.    Marland Kitchen ELIQUIS 5 MG TABS tablet Take 5 mg by mouth 2 (two) times daily.     . flecainide (TAMBOCOR) 150 MG tablet Take 0.5 tablets (75 mg total) by mouth 2 (two) times daily. 90 tablet 3  . furosemide (LASIX) 20 MG tablet Take 20 mg by mouth daily as needed for fluid.     Marland Kitchen glipiZIDE (GLUCOTROL) 5 MG tablet Take 5 mg by mouth 2 (two) times daily before a meal.     . ibuprofen (ADVIL,MOTRIN) 200 MG tablet Take 600 mg by mouth daily as needed for moderate pain.    Marland Kitchen levothyroxine (SYNTHROID, LEVOTHROID) 112 MCG tablet Take 56-168 mcg by mouth See admin instructions. TAKES ONE TABLET DAILY.  TAKES ONE-HALF TABLET IN ADDITION, EVERY OTHER DAY ONLY    . losartan (COZAAR) 50 MG tablet Take 50 mg by mouth daily.    Marland Kitchen lovastatin (MEVACOR) 20 MG tablet Take 20 mg by mouth every evening.     Marland Kitchen MATZIM LA 180 MG 24 hr tablet TAKE ONE TABLET BY MOUTH ONE TIME DAILY 60 tablet 6  . metFORMIN (GLUCOPHAGE) 500 MG tablet Take 500 mg by mouth 2 (two) times daily.    . metoprolol succinate (TOPROL-XL) 100 MG 24 hr tablet Take 100 mg by mouth daily.    Marland Kitchen SLOW IRON 160 (50 Fe) MG TBCR SR tablet Take 1 tablet by mouth daily.  3  . chlorthalidone (HYGROTON) 25 MG tablet Take 1 tablet (25 mg total) by mouth daily. 90 tablet 3   No current facility-administered medications for this visit.     Past Medical History:  Diagnosis Date  . Anemia   . Arthritis   . Bilateral carotid artery disease ([place])   . Diabetes mellitus without complication ([place])   . Difficult intubation    states 'lady that did the sleep study told me I have the smallest airway she has ever seen in an adult"  . Dysrhythmia   .  Heart murmur   . Hypertension   . Hypothyroid   . Obesity   . Obstructive sleep apnea    on C Pap  . PAF (paroxysmal atrial fibrillation) ([place])    a. newly dx in 09/2013; on eliquis    Past Surgical History:  Procedure Laterality Date  . CATARACT EXTRACTION Left   . CATARACT EXTRACTION W/PHACO Right 12/09/2015   Procedure: CATARACT EXTRACTION PHACO AND INTRAOCULAR LENS PLACEMENT (IOC);  Surgeon: Tonny Branch, MD;  Location: [facility];  Service: Ophthalmology;  Laterality: Right;  CDE:10.48  . COLONOSCOPY W/ POLYPECTOMY    . cyst on back of neck removed    . DILATION AND CURETTAGE OF UTERUS     x 5  . ENDARTERECTOMY Left 08/30/2015   Procedure: LEFT CAROYID ENDARTERECTOMY WITH XENOSURE BOVINE PERICARDIUM PATCH ANGIOPLASTY;  Surgeon: [name], MD;  Location: [facility];  Service: Vascular;  Laterality: Left;  . EYE SURGERY    . TONSILLECTOMY      ROS:  As stated in the HPI and negative for all other  systems.  PHYSICAL EXAM BP (!) 150/60   Pulse (!) 57   Ht 5\' 2"  (1.575 m)   Wt 246 lb (111.6 kg)   BMI 44.99 kg/m  GENERAL:  Well appearing NECK:  No jugular venous distention, waveform within normal limits, carotid upstroke brisk and symmetric, bilateral carotid bruits, no thyromegaly LUNGS:  Clear to auscultation bilaterally HEART:  PMI not displaced or sustained,S1 and S2 within normal limits, no S3, no S4, no clicks, no rubs, apical and right sternal border early peaking systolic murmur radiating slightly into the carotids, no diastolic murmurs ABD:  Flat, positive bowel sounds normal in frequency in pitch, no bruits, no rebound, no guarding, no midline pulsatile mass, no hepatomegaly, no splenomegaly EXT:  2 plus pulses throughout, no edema, no cyanosis no clubbing   EKG:   Sinus rhythm, rate 57, axis leftward , intervals within normal limits , no acute ST-T wave changes, nonspecific inferior T wave changes unchanged from previous.  08/05/2016  ASSESSMENT AND PLAN  ATRIAL FIB:   She will remain on the meds as listed.  She could not do a POET (Plain Old Exercise Treadmill) to look for EKG changes or arrhythmias.  I will check stool guaiac given her apparent anemia.  I will try to get these records.  I have asked her to get a new PCP at [facility] .  Ms. Carrina Pala has a CHA2DS2 - VASc score of 4 with a risk of stroke of 4%.  HTN:  BP is elevated.  I will change her to chlorthalidone.  She will keep a BP diary.    BRUIT:  She had 60 - 79% stenosis earlier this month at [facility].    OBESITY:  The patient understands the need to lose weight with diet and exercise. We have discussed specific strategies for this.

## 2016-08-05 ENCOUNTER — Encounter: Payer: Self-pay | Admitting: Cardiology

## 2016-08-05 ENCOUNTER — Ambulatory Visit (INDEPENDENT_AMBULATORY_CARE_PROVIDER_SITE_OTHER): Payer: Medicare Other | Admitting: Cardiology

## 2016-08-05 VITALS — BP 150/60 | HR 57 | Ht 62.0 in | Wt 246.0 lb

## 2016-08-05 DIAGNOSIS — I10 Essential (primary) hypertension: Secondary | ICD-10-CM

## 2016-08-05 DIAGNOSIS — D649 Anemia, unspecified: Secondary | ICD-10-CM | POA: Diagnosis not present

## 2016-08-05 DIAGNOSIS — I4891 Unspecified atrial fibrillation: Secondary | ICD-10-CM | POA: Diagnosis not present

## 2016-08-05 DIAGNOSIS — I6523 Occlusion and stenosis of bilateral carotid arteries: Secondary | ICD-10-CM

## 2016-08-05 MED ORDER — CHLORTHALIDONE 25 MG PO TABS: 25.0000 mg | ORAL_TABLET | Freq: Every day | ORAL | 3 refills | Status: DC

## 2016-08-05 NOTE — Patient Instructions (Signed)
Medication Instructions:  Please stop your HCTZ. Start Chlorthalidone 25 mg a day. Continue all other medications as listed.  Labwork: Please go to your primary care doctor to get stool guaiac cards.  Follow-Up: Follow up in 6 months with Dr. Percival Spanish.  You will receive a letter in the mail 2 months before you are due.  Please call us when you receive this letter to schedule your follow up appointment.   If you need a refill on your cardiac medications before your next appointment, please call your pharmacy.  Thank you for choosing [facility]!!

## 2016-08-10 NOTE — Addendum Note (Signed)
Addended by: Lianne Cure A on: 08/10/2016 09:29 AM   Modules accepted: Orders

## 2016-08-25 DIAGNOSIS — M5416 Radiculopathy, lumbar region: Secondary | ICD-10-CM | POA: Diagnosis not present

## 2016-08-25 DIAGNOSIS — I10 Essential (primary) hypertension: Secondary | ICD-10-CM | POA: Diagnosis not present

## 2016-08-25 DIAGNOSIS — M79651 Pain in right thigh: Secondary | ICD-10-CM | POA: Diagnosis not present

## 2016-09-08 DIAGNOSIS — D649 Anemia, unspecified: Secondary | ICD-10-CM | POA: Diagnosis not present

## 2016-09-13 ENCOUNTER — Other Ambulatory Visit: Payer: Self-pay | Admitting: Cardiology

## 2016-11-03 ENCOUNTER — Ambulatory Visit (INDEPENDENT_AMBULATORY_CARE_PROVIDER_SITE_OTHER): Payer: Medicare Other | Admitting: Family Medicine

## 2016-11-03 ENCOUNTER — Encounter: Payer: Self-pay | Admitting: Family Medicine

## 2016-11-03 VITALS — BP 135/57 | HR 54 | Temp 97.4°F | Ht 62.0 in | Wt 241.0 lb

## 2016-11-03 DIAGNOSIS — I4891 Unspecified atrial fibrillation: Secondary | ICD-10-CM

## 2016-11-03 DIAGNOSIS — I10 Essential (primary) hypertension: Secondary | ICD-10-CM

## 2016-11-03 DIAGNOSIS — I6523 Occlusion and stenosis of bilateral carotid arteries: Secondary | ICD-10-CM | POA: Diagnosis not present

## 2016-11-03 DIAGNOSIS — E119 Type 2 diabetes mellitus without complications: Secondary | ICD-10-CM

## 2016-11-03 LAB — BAYER DCA HB A1C WAIVED: HB A1C (BAYER DCA - WAIVED): 7.2 % — ABNORMAL HIGH (ref <7.0)

## 2016-11-03 NOTE — Progress Notes (Signed)
   HPI  Patient presents today to establish care for her primary care here. Patient's previously seen for acute only visits.  Patient has history of type 2 diabetes, hypertension, hypothyroidism, atrial fibrillation  51 diabetes Good medication compliance, average random blood sugar is 150-175 No hypoglycemia Patient is watching diet She goes to a weekly weight loss group and has an accountability friend for weight loss. She's had good success recently.  Hypertension Good medication compliance, no chest pain, dyspnea, palpitations.  Atrial fibrillation We discussed intermittent use of diltiazem IR, she understands this clearly now. Heart rate racing intermittently, a symptomatically today and overall well controlled.  PMH: Smoking status noted ROS: Per HPI  Objective: BP (!) 135/57   Pulse (!) 54   Temp 97.4 F (36.3 C) (Oral)   Ht '5\' 2"'$  (1.575 m)   Wt 241 lb (109.3 kg)   BMI 44.08 kg/m  Gen: NAD, alert, cooperative with exam HEENT: NCAT CV: RRR, good S1/S2, no murmur Resp: CTABL, no wheezes, non-labored Ext: No edema, warm Neuro: Alert and oriented, No gross deficits  Labs from 07/14/2016 Total cholesterol 204, HDL 49, LDL 120 A1c 6.7 Hemoglobin 11.4  01/07/16-microalbumin/creatinine ratio 11.5   Assessment and plan:  # Type 2 diabetes A1c today is controlled, congratulated on her good effort Continue metformin plus glipizide   # Hypertension Well-controlled today on losartan and chlorthalidone Likely affected also by long-acting Cardizem and Lasix.   # Atrial fibrillation Rate controlled, anticoagulated Continue metoprolol plus diltiazem, flecainide per cardiology Patient initially slightly bradycardia but asymptomatic, heart rate 54 before leaving    Orders Placed This Encounter  Procedures  . Bayer DCA Hb A1c Waived  . CMP14+EGFR    Meds ordered this encounter  Medications  . traMADol (ULTRAM) 50 MG tablet    Sig: TAKE ONE TABLET TWICE A  DAY WHEN NECESSARY FOR PAIN    Refill:  [place], MD [facility] 11/03/2016, 12:03 PM

## 2016-11-03 NOTE — Patient Instructions (Signed)
Great to see you!  Lets follow up in 3 months unless you need us sooner.    

## 2016-11-04 LAB — CMP14+EGFR
ALBUMIN: 4.3 g/dL (ref 3.6–4.8)
ALK PHOS: 92 IU/L (ref 39–117)
ALT: 22 IU/L (ref 0–32)
AST: 23 IU/L (ref 0–40)
Albumin/Globulin Ratio: 1.7 (ref 1.2–2.2)
BILIRUBIN TOTAL: 0.6 mg/dL (ref 0.0–1.2)
BUN / CREAT RATIO: 42 — AB (ref 12–28)
BUN: 25 mg/dL (ref 8–27)
CHLORIDE: 99 mmol/L (ref 96–106)
CO2: 25 mmol/L (ref 18–29)
Calcium: 9 mg/dL (ref 8.7–10.3)
Creatinine, Ser: 0.59 mg/dL (ref 0.57–1.00)
GFR calc Af Amer: 110 mL/min/{1.73_m2} (ref >59)
GFR calc non Af Amer: 96 mL/min/{1.73_m2} (ref >59)
GLUCOSE: 139 mg/dL — AB (ref 65–99)
Globulin, Total: 2.6 g/dL (ref 1.5–4.5)
Potassium: 3.9 mmol/L (ref 3.5–5.2)
Sodium: 138 mmol/L (ref 134–144)
Total Protein: 6.9 g/dL (ref 6.0–8.5)

## 2016-11-23 ENCOUNTER — Telehealth: Payer: Self-pay | Admitting: Family Medicine

## 2016-11-24 NOTE — Telephone Encounter (Signed)
Have you seen this RX come in on this patient?

## 2016-11-26 NOTE — Telephone Encounter (Signed)
done

## 2016-12-15 ENCOUNTER — Encounter: Payer: Self-pay | Admitting: Family Medicine

## 2016-12-15 ENCOUNTER — Ambulatory Visit (INDEPENDENT_AMBULATORY_CARE_PROVIDER_SITE_OTHER): Payer: Medicare Other | Admitting: Family Medicine

## 2016-12-15 VITALS — BP 164/63 | HR 65 | Temp 98.1°F | Ht 62.0 in | Wt 247.0 lb

## 2016-12-15 DIAGNOSIS — R399 Unspecified symptoms and signs involving the genitourinary system: Secondary | ICD-10-CM | POA: Diagnosis not present

## 2016-12-15 DIAGNOSIS — I6523 Occlusion and stenosis of bilateral carotid arteries: Secondary | ICD-10-CM | POA: Diagnosis not present

## 2016-12-15 LAB — URINALYSIS, COMPLETE
BILIRUBIN UA: NEGATIVE
Glucose, UA: NEGATIVE
KETONES UA: NEGATIVE
Nitrite, UA: NEGATIVE
PROTEIN UA: NEGATIVE
SPEC GRAV UA: 1.02 (ref 1.005–1.030)
Urobilinogen, Ur: 0.2 mg/dL (ref 0.2–1.0)
pH, UA: 5.5 (ref 5.0–7.5)

## 2016-12-15 LAB — MICROSCOPIC EXAMINATION
RENAL EPITHEL UA: NONE SEEN /HPF
WBC, UA: >30 /hpf — AB (ref 0–?)

## 2016-12-15 MED ORDER — SULFAMETHOXAZOLE-TRIMETHOPRIM 800-160 MG PO TABS: 1.0000 | ORAL_TABLET | Freq: Two times a day (BID) | ORAL | 0 refills | Status: DC

## 2016-12-15 NOTE — Progress Notes (Signed)
Chief Complaint  Patient presents with  . Dysuria    pt here today c/o discomfort with urination for a couple of weeks    HPI  Patient presents today for burning with urination and frequency for two weeks. Denies fever . No flank pain. No nausea, vomiting.   PMH: Smoking status noted ROS: Per HPI  Objective: BP (!) 164/63   Pulse 65   Temp 98.1 F (36.7 C) (Oral)   Ht 5\' 2"  (1.575 m)   Wt 247 lb (112 kg)   BMI 45.18 kg/m  Gen: NAD, alert, cooperative with exam HEENT: NCAT, EOMI, PERRL CV: RRR, good S1/S2, no murmur Resp: CTABL, no wheezes, non-labored Abd: SNTND, BS present, no guarding or organomegaly Ext: No edema, warm Neuro: Alert and oriented, No gross deficits  Assessment and plan:  1. UTI symptoms     Meds ordered this encounter  Medications  . sulfamethoxazole-trimethoprim (BACTRIM DS,SEPTRA DS) 800-160 MG tablet    Sig: Take 1 tablet by mouth 2 (two) times daily.    Dispense:  14 tablet    Refill:  0    Orders Placed This Encounter  Procedures  . Urine Culture  . Urinalysis, Complete    Follow up as needed.  Claretta Fraise, MD

## 2016-12-18 LAB — URINE CULTURE

## 2016-12-24 ENCOUNTER — Other Ambulatory Visit: Payer: Self-pay | Admitting: Physician Assistant

## 2016-12-24 NOTE — Telephone Encounter (Signed)
This is Dr. Hochrein's pt. °

## 2017-01-15 ENCOUNTER — Other Ambulatory Visit: Payer: Self-pay | Admitting: Family Medicine

## 2017-01-19 ENCOUNTER — Ambulatory Visit (INDEPENDENT_AMBULATORY_CARE_PROVIDER_SITE_OTHER): Payer: Medicare Other | Admitting: Family

## 2017-01-19 ENCOUNTER — Encounter: Payer: Self-pay | Admitting: Family

## 2017-01-19 VITALS — BP 138/61 | HR 56 | Temp 97.5°F | Ht 62.0 in | Wt 248.0 lb

## 2017-01-19 DIAGNOSIS — R300 Dysuria: Secondary | ICD-10-CM

## 2017-01-19 DIAGNOSIS — I6523 Occlusion and stenosis of bilateral carotid arteries: Secondary | ICD-10-CM | POA: Diagnosis not present

## 2017-01-19 DIAGNOSIS — N3000 Acute cystitis without hematuria: Secondary | ICD-10-CM

## 2017-01-19 LAB — MICROSCOPIC EXAMINATION
Renal Epithel, UA: NONE SEEN /hpf
WBC, UA: >30 /hpf — AB (ref 0–?)

## 2017-01-19 LAB — URINALYSIS, COMPLETE
Bilirubin, UA: NEGATIVE
Glucose, UA: NEGATIVE
Ketones, UA: NEGATIVE
Nitrite, UA: POSITIVE — AB
Specific Gravity, UA: 1.02 (ref 1.005–1.030)
Urobilinogen, Ur: 0.2 mg/dL (ref 0.2–1.0)
pH, UA: 7 (ref 5.0–7.5)

## 2017-01-19 MED ORDER — CIPROFLOXACIN HCL 500 MG PO TABS: 500.0000 mg | ORAL_TABLET | Freq: Two times a day (BID) | ORAL | 0 refills | Status: DC

## 2017-01-19 MED ORDER — NITROFURANTOIN MONOHYD MACRO 100 MG PO CAPS
100.0000 mg | ORAL_CAPSULE | Freq: Two times a day (BID) | ORAL | 0 refills | Status: DC
Start: 2017-01-19 — End: 2017-02-08

## 2017-01-19 NOTE — Patient Instructions (Signed)

## 2017-01-19 NOTE — Progress Notes (Addendum)
   Subjective:    Patient ID: Emily [name], female    DOB: 08/09/1950, 66 y.o.   MRN: [id number]  Dysuria   This is a recurrent problem. The current episode started in the past 7 days. The problem occurs intermittently. The problem has been waxing and waning. The quality of the pain is described as burning. The pain is at a severity of 7/10. The pain is mild. There has been no fever. Associated symptoms include frequency and urgency. Pertinent negatives include no discharge, flank pain, hematuria or nausea. She has tried antibiotics for the symptoms. The treatment provided mild relief.      Review of Systems  Gastrointestinal: Negative for nausea.  Genitourinary: Positive for dysuria, frequency and urgency. Negative for flank pain and hematuria.  All other systems reviewed and are negative.      Objective:   Physical Exam  Constitutional: She is oriented to person, place, and time. She appears well-developed and well-nourished. No distress.  HENT:  Head: Normocephalic.  Eyes: Pupils are equal, round, and reactive to light.  Neck: Normal range of motion. Neck supple. No thyromegaly present.  Cardiovascular: Normal rate, regular rhythm, normal heart sounds and intact distal pulses.   No murmur heard. Pulmonary/Chest: Effort normal and breath sounds normal. No respiratory distress. She has no wheezes.  Abdominal: Soft. Bowel sounds are normal. She exhibits no distension. There is no tenderness.  Musculoskeletal: Normal range of motion. She exhibits no edema or tenderness.  Neurological: She is alert and oriented to person, place, and time.  Skin: Skin is warm and dry.  Psychiatric: She has a normal mood and affect. Her behavior is normal. Judgment and thought content normal.  Vitals reviewed.    BP 138/61   Pulse (!) 56   Temp (!) 97.5 F (36.4 C) (Oral)   Ht 5\' 2"  (1.575 m)   Wt 248 lb (112.5 kg)   BMI 45.36 kg/m      Assessment & Plan:  1. Dysuria - Urinalysis,  Complete  2. Acute cystitis without hematuria Force fluids AZO over the counter X2 days RTO prn Culture pending - Urine Culture - nitrofurantoin, macrocrystal-monohydrate, (MACROBID) 100 MG capsule; Take 1 capsule (100 mg total) by mouth 2 (two) times daily.  Dispense: 10 capsule; Refill: 0  Evelina Dun, FNP

## 2017-01-19 NOTE — Addendum Note (Signed)
Addended by: Evelina Dun A on: 01/19/2017 10:39 AM   Modules accepted: Orders

## 2017-01-21 ENCOUNTER — Other Ambulatory Visit: Payer: Self-pay | Admitting: Family

## 2017-01-21 LAB — URINE CULTURE

## 2017-01-21 MED ORDER — SULFAMETHOXAZOLE-TRIMETHOPRIM 800-160 MG PO TABS: 1.0000 | ORAL_TABLET | Freq: Two times a day (BID) | ORAL | 0 refills | Status: DC

## 2017-01-22 MED ORDER — SULFAMETHOXAZOLE-TRIMETHOPRIM 800-160 MG PO TABS: 1.0000 | ORAL_TABLET | Freq: Two times a day (BID) | ORAL | 0 refills | Status: DC

## 2017-01-22 NOTE — Addendum Note (Signed)
Addended by: Antonietta Barcelona D on: 01/22/2017 10:44 AM   Modules accepted: Orders

## 2017-01-27 ENCOUNTER — Encounter: Payer: Self-pay | Admitting: Surgery

## 2017-01-31 ENCOUNTER — Other Ambulatory Visit: Payer: Self-pay | Admitting: Family Medicine

## 2017-02-05 ENCOUNTER — Telehealth: Payer: Self-pay | Admitting: Family Medicine

## 2017-02-05 NOTE — Telephone Encounter (Signed)
What symptoms do you have? uti  How long have you been sick? today  Have you been seen for this problem? Yes 01/19/17  If your provider decides to give you a prescription, which pharmacy would you like for it to be sent to? [facility] in [place]   Patient informed that this information will be sent to the clinical staff for review and that they should receive a follow up call.

## 2017-02-06 NOTE — Telephone Encounter (Signed)
Spoke with patient and she verbalized understanding. 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 Gave appt for Monday with Wendi Snipes.

## 2017-02-06 NOTE — Telephone Encounter (Signed)
With recent UTI, tretaed appropriately by culture would recommend coming in for eval.     Laroy Apple, MD [facility] 02/06/2017, 9:38 AM

## 2017-02-06 NOTE — Telephone Encounter (Signed)
Please advise and route to pool A

## 2017-02-08 ENCOUNTER — Telehealth: Payer: Self-pay

## 2017-02-08 ENCOUNTER — Other Ambulatory Visit: Payer: Self-pay | Admitting: Family Medicine

## 2017-02-08 ENCOUNTER — Ambulatory Visit (INDEPENDENT_AMBULATORY_CARE_PROVIDER_SITE_OTHER): Payer: Medicare Other | Admitting: Family

## 2017-02-08 ENCOUNTER — Encounter: Payer: Self-pay | Admitting: Family

## 2017-02-08 ENCOUNTER — Ambulatory Visit (HOSPITAL_COMMUNITY)
Admission: RE | Admit: 2017-02-08 | Discharge: 2017-02-08 | Disposition: A | Discharge status: Home / Self Care | Payer: Medicare Other | Source: Ambulatory Visit | Attending: Surgery | Admitting: Surgery

## 2017-02-08 ENCOUNTER — Ambulatory Visit (INDEPENDENT_AMBULATORY_CARE_PROVIDER_SITE_OTHER): Payer: Medicare Other | Admitting: Family Medicine

## 2017-02-08 ENCOUNTER — Encounter: Payer: Self-pay | Admitting: Family Medicine

## 2017-02-08 VITALS — BP 138/64 | HR 53 | Temp 97.4°F | Resp 18 | Ht 62.0 in | Wt 248.0 lb

## 2017-02-08 VITALS — BP 150/73 | HR 52 | Temp 97.6°F | Ht 62.0 in | Wt 247.8 lb

## 2017-02-08 DIAGNOSIS — M545 Low back pain, unspecified: Secondary | ICD-10-CM

## 2017-02-08 DIAGNOSIS — N3000 Acute cystitis without hematuria: Secondary | ICD-10-CM | POA: Diagnosis not present

## 2017-02-08 DIAGNOSIS — E041 Nontoxic single thyroid nodule: Secondary | ICD-10-CM

## 2017-02-08 DIAGNOSIS — Z9889 Other specified postprocedural states: Secondary | ICD-10-CM

## 2017-02-08 DIAGNOSIS — I6523 Occlusion and stenosis of bilateral carotid arteries: Secondary | ICD-10-CM

## 2017-02-08 DIAGNOSIS — R300 Dysuria: Secondary | ICD-10-CM | POA: Diagnosis not present

## 2017-02-08 LAB — URINALYSIS, COMPLETE
BILIRUBIN UA: NEGATIVE
Glucose, UA: NEGATIVE
Ketones, UA: NEGATIVE
Nitrite, UA: NEGATIVE
PH UA: 5.5 (ref 5.0–7.5)
Protein, UA: NEGATIVE
Specific Gravity, UA: 1.01 (ref 1.005–1.030)
Urobilinogen, Ur: 0.2 mg/dL (ref 0.2–1.0)

## 2017-02-08 LAB — VAS US CAROTID
LCCADDIAS: -21 cm/s
LCCADSYS: -91 cm/s
LCCAPSYS: 97 cm/s
LEFT ECA DIAS: -11 cm/s
Left CCA prox dias: 23 cm/s
Left ICA dist dias: -15 cm/s
Left ICA dist sys: -61 cm/s
Left ICA prox dias: -44 cm/s
Left ICA prox sys: -160 cm/s
RCCADSYS: -148 cm/s
RIGHT CCA MID DIAS: -18 cm/s
RIGHT ECA DIAS: -7 cm/s
Right CCA prox dias: 18 cm/s
Right CCA prox sys: 79 cm/s

## 2017-02-08 LAB — MICROSCOPIC EXAMINATION
RENAL EPITHEL UA: NONE SEEN /HPF
WBC, UA: >30 /hpf — AB (ref 0–?)

## 2017-02-08 MED ORDER — TRAMADOL HCL 50 MG PO TABS: 50.0000 mg | ORAL_TABLET | Freq: Four times a day (QID) | ORAL | 0 refills | Status: DC | PRN

## 2017-02-08 MED ORDER — CEFDINIR 300 MG PO CAPS: 300.0000 mg | ORAL_CAPSULE | Freq: Two times a day (BID) | ORAL | 0 refills | Status: DC

## 2017-02-08 MED ORDER — CIPROFLOXACIN HCL 500 MG PO TABS
500.0000 mg | ORAL_TABLET | Freq: Two times a day (BID) | ORAL | 0 refills | Status: DC
Start: 2017-02-08 — End: 2017-02-08

## 2017-02-08 NOTE — Progress Notes (Signed)
   HPI  Patient presents today here with right low back pain and possible UTI.  UTI Patient with urinary hesitancy. This has improved over the weekend after taking cranberry.  Describes right low back pain with radiation around to the right groin. This is worse with twisting to the left. Feels better when she stands. Worse with bending over as well.  No injury. She has had pain similar to this previously. With her urinary hesitancy she believes that it may be connected to the UTI.  Patient reports history of thyroid nodules found years ago that were never followed up.  PMH: Smoking status noted ROS: Per HPI  Objective: BP (!) 150/73   Pulse (!) 52   Temp 97.6 F (36.4 C) (Oral)   Ht 5\' 2"  (1.575 m)   Wt 247 lb 12.8 oz (112.4 kg)   BMI 45.32 kg/m  Gen: NAD, alert, cooperative with exam HEENT: NCAT CV: RRR, good S1/S2, no murmur Resp: CTABL, no wheezes, non-labored Abd: SNTND, BS present, no guarding or organomegaly, no CVA tenderness, no suprapubic tenderness Ext: No edema, warm Neuro: Alert and oriented, No gross deficits  Assessment and plan:  # UTI Treat with Omnicef, initially treated with Cipro, however with interaction with sulfonylurea will avoid this for now. Patient has had Klebsiella on her last 2 cultures, culture pending. This is the third UTI in 2 months, refer to urology to consider anatomic reasons  # Right-sided low back pain without sciatica Likely musculoskeletal pain Could be related UTI For now well treated UTI first, if not improving will give short course of steroids, discussed that this will be deleterious on her blood glucose control  # Thyroid nodule History of thyroid nodules Repeat ultrasound     Orders Placed This Encounter  Procedures  . Urine Culture  . US Soft Tissue Head/Neck    Standing Status:   Future    Standing Expiration Date:   04/10/2018    Order Specific Question:   Reason for Exam (SYMPTOM  OR DIAGNOSIS REQUIRED)      Answer:   follow up thyroid nodule    Order Specific Question:   Preferred imaging location?    Answer:   [facility]  . Urinalysis, Complete  . Ambulatory referral to Urology    Referral Priority:   Routine    Referral Type:   Consultation    Referral Reason:   Specialty Services Required    Requested Specialty:   Urology    Number of Visits Requested:   1    Meds ordered this encounter  Medications  . DISCONTD: ciprofloxacin (CIPRO) 500 MG tablet    Sig: Take 1 tablet (500 mg total) by mouth 2 (two) times daily.    Dispense:  14 tablet    Refill:  0  . cefdinir (OMNICEF) 300 MG capsule    Sig: Take 1 capsule (300 mg total) by mouth 2 (two) times daily. 1 po BID    Dispense:  20 capsule    Refill:  0    Please [place], MD [facility] 02/08/2017, 3:49 PM

## 2017-02-08 NOTE — Patient Instructions (Signed)
Stroke Prevention Some medical conditions and behaviors are associated with an increased chance of having a stroke. You may prevent a stroke by making healthy choices and managing medical conditions. How can I reduce my risk of having a stroke?  Stay physically active. Get at least 30 minutes of activity on most or all days.  Do not smoke. It may also be helpful to avoid exposure to secondhand smoke.  Limit alcohol use. Moderate alcohol use is considered to be:  No more than 2 drinks per day for men.  No more than 1 drink per day for nonpregnant women.  Eat healthy foods. This involves:  Eating 5 or more servings of fruits and vegetables a day.  Making dietary changes that address high blood pressure (hypertension), high cholesterol, diabetes, or obesity.  Manage your cholesterol levels.  Making food choices that are high in fiber and low in saturated fat, trans fat, and cholesterol may control cholesterol levels.  Take any prescribed medicines to control cholesterol as directed by your health care provider.  Manage your diabetes.  Controlling your carbohydrate and sugar intake is recommended to manage diabetes.  Take any prescribed medicines to control diabetes as directed by your health care provider.  Control your hypertension.  Making food choices that are low in salt (sodium), saturated fat, trans fat, and cholesterol is recommended to manage hypertension.  Ask your health care provider if you need treatment to lower your blood pressure. Take any prescribed medicines to control hypertension as directed by your health care provider.  If you are 18-39 years of age, have your blood pressure checked every 3-5 years. If you are 40 years of age or older, have your blood pressure checked every year.  Maintain a healthy weight.  Reducing calorie intake and making food choices that are low in sodium, saturated fat, trans fat, and cholesterol are recommended to manage  weight.  Stop drug abuse.  Avoid taking birth control pills.  Talk to your health care provider about the risks of taking birth control pills if you are over 35 years old, smoke, get migraines, or have ever had a blood clot.  Get evaluated for sleep disorders (sleep apnea).  Talk to your health care provider about getting a sleep evaluation if you snore a lot or have excessive sleepiness.  Take medicines only as directed by your health care provider.  For some people, aspirin or blood thinners (anticoagulants) are helpful in reducing the risk of forming abnormal blood clots that can lead to stroke. If you have the irregular heart rhythm of atrial fibrillation, you should be on a blood thinner unless there is a good reason you cannot take them.  Understand all your medicine instructions.  Make sure that other conditions (such as anemia or atherosclerosis) are addressed. Get help right away if:  You have sudden weakness or numbness of the face, arm, or leg, especially on one side of the body.  Your face or eyelid droops to one side.  You have sudden confusion.  You have trouble speaking (aphasia) or understanding.  You have sudden trouble seeing in one or both eyes.  You have sudden trouble walking.  You have dizziness.  You have a loss of balance or coordination.  You have a sudden, severe headache with no known cause.  You have new chest pain or an irregular heartbeat. Any of these symptoms may represent a serious problem that is an emergency. Do not wait to see if the symptoms will go away.   Get medical help at once. Call your local emergency services (911 in [place] Do not drive yourself to the hospital. This information is not intended to replace advice given to you by your health care provider. Make sure you discuss any questions you have with your health care provider. Document Released: 06/25/2004 Document Revised: 10/24/2015 Document Reviewed: 11/18/2012 Elsevier  Interactive Patient Education  2017 Elsevier Inc.  

## 2017-02-08 NOTE — Progress Notes (Signed)
Chief Complaint: Follow up Extracranial Carotid Artery Stenosis   History of Present Illness  Emily [name] is a 66 y.o. female who is status post left carotid endarterectomy with bovine pericardial patch angioplasty on 3 07/31/2015 by Dr. Trula Slade. This was done for asymptomatic bilateral carotid stenosis, left greater than right. Intraoperative findings included a 85% stenosis. Her postoperative follow-up was uncomplicated and she was discharged to home the following day.She stated that her left middle finger was a little numb after the procedure.  She was a very difficult A-line.    She reports no new neurologic symptoms.  She has had pain in her right leg which has been attributed to musculoskeletal.  Dr. Trula Slade last evaluated pt on 08-03-16. At that time right extracranial carotid artery stenosis remained in the 60-79% category.  The left endarterectomy site was widely patent.The patient remained neurologically intact. We will continue with duplex ultrasound surveillance in 6 months.  He denies any known history of stroke or TIA. Specifically he denies a history of amaurosis fugax or monocular blindness, unilateral facial drooping, hemiplegia, or receptive or expressive aphasia.    She denies claudication symptoms with walking. She has UTI symptoms and states she will see Dr. Wendi Snipes today. She states she has had 3 UTI's in the past year, and has the same sx's.   She states the use of her C-PAP has helped control her atrial fib.   Pt Diabetic: yes, states her last A1C was 7.? 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 Percival Spanish   Past Medical History:  Diagnosis Date  . Anemia   . Arthritis   . Bilateral carotid artery disease ([place])   . Diabetes mellitus without complication ([place])   . Difficult intubation    states  'lady that did the sleep study told me I have the smallest airway she has ever seen in an adult"  . Dysrhythmia   . Heart murmur   . Hypertension   . Hypothyroid   . Obesity   . Obstructive sleep apnea    on C Pap  . PAF (paroxysmal atrial fibrillation) ([place])    a. newly dx in 09/2013; on eliquis  . UTI (urinary tract infection)     Social History Social History  Substance Use Topics  . Smoking status: Never Smoker  . Smokeless tobacco: Never Used  . Alcohol use No    Family History Family History  Problem Relation Age of Onset  . COPD Father   . Heart failure Father   . Heart disease Father   . Arrhythmia Sister   . Arrhythmia Sister   . Arrhythmia Sister        had PPM also  . Cancer Sister     Surgical History Past Surgical History:  Procedure Laterality Date  . CATARACT EXTRACTION Left   . CATARACT EXTRACTION W/PHACO Right 12/09/2015   Procedure: CATARACT EXTRACTION PHACO AND INTRAOCULAR LENS PLACEMENT (IOC);  Surgeon: Tonny Branch, MD;  Location: [facility];  Service: Ophthalmology;  Laterality: Right;  CDE:10.48  . COLONOSCOPY W/ POLYPECTOMY    . cyst on back of neck removed    . DILATION AND CURETTAGE OF UTERUS     x 5  . ENDARTERECTOMY Left 08/30/2015   Procedure: LEFT CAROYID ENDARTERECTOMY WITH XENOSURE BOVINE PERICARDIUM PATCH ANGIOPLASTY;  Surgeon: [name], MD;  Location: [facility];  Service: Vascular;  Laterality: Left;  . EYE SURGERY    . TONSILLECTOMY      Allergies  Allergen Reactions  . Quinapril Hcl Cough  . Statins Other (See Comments)    Not all Statins but some cause cough and pain in legs.  . Tape Other (See Comments)    Redness, please use "paper" tape    Current Outpatient Prescriptions  Medication Sig Dispense Refill  . diltiazem (CARDIZEM) 30 MG tablet Take 30 mg by mouth daily.    Marland Kitchen ELIQUIS 5 MG TABS tablet TAKE 1 TABLET (5 MG TOTAL) BY MOUTH 2 (TWO) TIMES DAILY. 60 tablet 5  . flecainide (TAMBOCOR) 150 MG tablet TAKE 0.5 TABLETS  (75 MG TOTAL) BY MOUTH 2 (TWO) TIMES DAILY. 90 tablet 3  . glipiZIDE (GLUCOTROL) 5 MG tablet TAKE 1 TABLET BY MOUTH TWICE A DAY 180 tablet 3  . ibuprofen (ADVIL,MOTRIN) 200 MG tablet Take 600 mg by mouth daily as needed for moderate pain.    Marland Kitchen levothyroxine (SYNTHROID, LEVOTHROID) 112 MCG tablet Take 56-168 mcg by mouth See admin instructions. TAKES ONE TABLET DAILY. TAKES ONE-HALF TABLET IN ADDITION, EVERY OTHER DAY ONLY    . losartan (COZAAR) 50 MG tablet TAKE ONE TABLET BY MOUTH ONE TIME DAILY 90 tablet 0  . lovastatin (MEVACOR) 20 MG tablet Take 20 mg by mouth every evening.     Marland Kitchen MATZIM LA 180 MG 24 hr tablet TAKE ONE TABLET BY MOUTH ONE TIME DAILY 60 tablet 6  . metFORMIN (GLUCOPHAGE) 500 MG tablet Take 500 mg by mouth 2 (two) times daily.    . metoprolol succinate (TOPROL-XL) 100 MG 24 hr tablet Take 100 mg by mouth daily.    . nitrofurantoin, macrocrystal-monohydrate, (MACROBID) 100 MG capsule Take 1 capsule (100 mg total) by mouth 2 (two) times daily. 10 capsule 0  . SLOW IRON 160 (50 Fe) MG TBCR SR tablet Take 1 tablet by mouth daily.  3  . sulfamethoxazole-trimethoprim (BACTRIM DS) 800-160 MG tablet Take 1 tablet by mouth 2 (two) times daily. 14 tablet 0  . traMADol (ULTRAM) 50 MG tablet TAKE ONE TABLET TWICE A DAY WHEN NECESSARY FOR PAIN  3  . chlorthalidone (HYGROTON) 25 MG tablet Take 1 tablet (25 mg total) by mouth daily. 90 tablet 3  . furosemide (LASIX) 20 MG tablet Take 20 mg by mouth daily as needed for fluid.      No current facility-administered medications for this visit.     Review of Systems : See HPI for pertinent positives and negatives.  Physical Examination  Vitals:   02/08/17 1057 02/08/17 1101  BP: (!) 148/52 138/64  Pulse: (!) 53   Resp: 18   Temp: (!) 97.4 F (36.3 C)   TempSrc: Oral   SpO2: 95%   Weight: 248 lb (112.5 kg)   Height: 5\' 2"  (1.575 m)    Body mass index is 45.36 kg/m.  General: WDWN morbidly obese female in NAD GAIT: normal Eyes:  PERRLA Pulmonary:  Respirations are non-labored, good air movement, CTAB, no rales,  rhonchi, or wheezing.  Cardiac: regular rhythm, + murmur.  VASCULAR EXAM Carotid Bruits Right Left   Positive Positive     Abdominal aortic pulse is not palpable. Radial pulses are 2+ palpable and equal.  LE Pulses Right Left       POPLITEAL  not palpable   not palpable       POSTERIOR TIBIAL  not palpable   not palpable        DORSALIS PEDIS      ANTERIOR TIBIAL 2+ palpable  2+ palpable     Gastrointestinal: soft, nontender, BS WNL, no r/g, no palpable masses, large panus.  Musculoskeletal: No muscle atrophy/wasting. M/S 5/5 throughout, extremities without ischemic changes.  Neurologic:  A&O X 3; appropriate affect, sensation is normal; speech is normal, CN 2-12 intact, pain and light touch intact in extremities, motor exam as listed above.    Assessment: Marcena Dias is a 66 y.o. female who is status post left carotid endarterectomy with bovine pericardial patch angioplasty on 3 07/31/2015. She has no history of stroke or TIA.   Her atherosclerotic risk factors include almost in control DM, PAF (takes Eliquis), OSA (uses CPAP), and morbid obesity.  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   DATA Right ICA: 60-79% stenosis. Left ICA: No restenosis, mild hyperplasia at the distal patch.  Right vertebral artery flow is antegrade, left is antegrade/diminished. Bilateral subclavian artery waveforms are normal.  No significant change compared to the previous exam on 08-03-16.    Plan: Follow-up in 6 months with Carotid Duplex scan.   I discussed in depth with the patient the nature of atherosclerosis, and emphasized the importance of maximal medical management including strict control of blood pressure,  blood glucose, and lipid levels, obtaining regular exercise, and continued cessation of smoking.  The patient is aware that without maximal medical management the underlying atherosclerotic disease process will progress, limiting the benefit of any interventions. The patient was given information about stroke prevention and what symptoms should prompt the patient to seek immediate medical care. Thank you for allowing [place] to participate in this patient's care.  Clemon Chambers, RN, MSN, FNP-C [facility] Office: [facility] Physician: Trula Slade  02/08/17 11:15 AM

## 2017-02-08 NOTE — Telephone Encounter (Signed)
Patient was just seen and forgot to ask for a refill on her Tramadol. Please advise and let rhonda case know.

## 2017-02-08 NOTE — Telephone Encounter (Signed)
Refilled as discussed, printed.  Laroy Apple, MD [facility] 02/08/2017, 3:52 PM

## 2017-02-08 NOTE — Patient Instructions (Signed)
Great to see you!   Urinary Tract Infection, Adult A urinary tract infection (UTI) is an infection of any part of the urinary tract, which includes the kidneys, ureters, bladder, and urethra. These organs make, store, and get rid of urine in the body. UTI can be a bladder infection (cystitis) or kidney infection (pyelonephritis). What are the causes? This infection may be caused by fungi, viruses, or bacteria. Bacteria are the most common cause of UTIs. This condition can also be caused by repeated incomplete emptying of the bladder during urination. What increases the risk? This condition is more likely to develop if:  You ignore your need to urinate or hold urine for long periods of time.  You do not empty your bladder completely during urination.  You wipe back to front after urinating or having a bowel movement, if you are female.  You are uncircumcised, if you are female.  You are constipated.  You have a urinary catheter that stays in place (indwelling).  You have a weak defense (immune) system.  You have a medical condition that affects your bowels, kidneys, or bladder.  You have diabetes.  You take antibiotic medicines frequently or for long periods of time, and the antibiotics no longer work well against certain types of infections (antibiotic resistance).  You take medicines that irritate your urinary tract.  You are exposed to chemicals that irritate your urinary tract.  You are female.  What are the signs or symptoms? Symptoms of this condition include:  Fever.  Frequent urination or passing small amounts of urine frequently.  Needing to urinate urgently.  Pain or burning with urination.  Urine that smells bad or unusual.  Cloudy urine.  Pain in the lower abdomen or back.  Trouble urinating.  Blood in the urine.  Vomiting or being less hungry than normal.  Diarrhea or abdominal pain.  Vaginal discharge, if you are female.  How is this  diagnosed? This condition is diagnosed with a medical history and physical exam. You will also need to provide a urine sample to test your urine. Other tests may be done, including:  Blood tests.  Sexually transmitted disease (STD) testing.  If you have had more than one UTI, a cystoscopy or imaging studies may be done to determine the cause of the infections. How is this treated? Treatment for this condition often includes a combination of two or more of the following:  Antibiotic medicine.  Other medicines to treat less common causes of UTI.  Over-the-counter medicines to treat pain.  Drinking enough water to stay hydrated.  Follow these instructions at home:  Take over-the-counter and prescription medicines only as told by your health care provider.  If you were prescribed an antibiotic, take it as told by your health care provider. Do not stop taking the antibiotic even if you start to feel better.  Avoid alcohol, caffeine, tea, and carbonated beverages. They can irritate your bladder.  Drink enough fluid to keep your urine clear or pale yellow.  Keep all follow-up visits as told by your health care provider. This is important.  Make sure to: ? Empty your bladder often and completely. Do not hold urine for long periods of time. ? Empty your bladder before and after sex. ? Wipe from front to back after a bowel movement if you are female. Use each tissue one time when you wipe. Contact a health care provider if:  You have back pain.  You have a fever.  You feel nauseous or   vomit.  Your symptoms do not get better after 3 days.  Your symptoms go away and then return. Get help right away if:  You have severe back pain or lower abdominal pain.  You are vomiting and cannot keep down any medicines or water. This information is not intended to replace advice given to you by your health care provider. Make sure you discuss any questions you have with your health care  provider. Document Released: 02/25/2005 Document Revised: 10/30/2015 Document Reviewed: 04/08/2015 Elsevier Interactive Patient Education  2017 [employer].

## 2017-02-09 ENCOUNTER — Other Ambulatory Visit: Payer: Self-pay | Admitting: Family Medicine

## 2017-02-09 DIAGNOSIS — E041 Nontoxic single thyroid nodule: Secondary | ICD-10-CM

## 2017-02-11 LAB — URINE CULTURE

## 2017-02-15 ENCOUNTER — Ambulatory Visit (HOSPITAL_COMMUNITY): Admission: RE | Admit: 2017-02-15 | Payer: Medicare Other | Source: Ambulatory Visit

## 2017-02-16 ENCOUNTER — Telehealth: Payer: Self-pay

## 2017-02-16 ENCOUNTER — Ambulatory Visit (INDEPENDENT_AMBULATORY_CARE_PROVIDER_SITE_OTHER): Payer: Medicare Other | Admitting: Family Medicine

## 2017-02-16 ENCOUNTER — Encounter: Payer: Self-pay | Admitting: Family Medicine

## 2017-02-16 VITALS — BP 133/57 | HR 58 | Temp 97.2°F | Ht 62.0 in | Wt 248.6 lb

## 2017-02-16 DIAGNOSIS — E119 Type 2 diabetes mellitus without complications: Secondary | ICD-10-CM | POA: Diagnosis not present

## 2017-02-16 DIAGNOSIS — E039 Hypothyroidism, unspecified: Secondary | ICD-10-CM | POA: Diagnosis not present

## 2017-02-16 DIAGNOSIS — I6523 Occlusion and stenosis of bilateral carotid arteries: Secondary | ICD-10-CM | POA: Diagnosis not present

## 2017-02-16 DIAGNOSIS — I4891 Unspecified atrial fibrillation: Secondary | ICD-10-CM | POA: Diagnosis not present

## 2017-02-16 LAB — BAYER DCA HB A1C WAIVED: HB A1C (BAYER DCA - WAIVED): 7.7 % — ABNORMAL HIGH (ref <7.0)

## 2017-02-16 NOTE — Patient Instructions (Addendum)
Great to see you!   

## 2017-02-16 NOTE — Telephone Encounter (Signed)
Pt missed appointment at [facility] yesterday. [facility] to reschedule

## 2017-02-16 NOTE — Progress Notes (Signed)
   HPI  Patient presents today here for follow-up chronic medical conditions.  Type 2 diabetes Not checking blood sugar much, she is watching her diet moderately, she's occupationally active with no formal exercise routine.  Atrial fibrillation No bleeding, no palpitations Good medication compliance.  Hypothyroidism Asymptomatic Good medication compliance  PMH: Smoking status noted ROS: Per HPI  Objective: BP (!) 133/57   Pulse (!) 58   Temp (!) 97.2 F (36.2 C) (Oral)   Ht '5\' 2"'$  (1.575 m)   Wt 248 lb 9.6 oz (112.8 kg)   BMI 45.47 kg/m  Gen: NAD, alert, cooperative with exam HEENT: NCAT, EOMI, PERRL CV: RRR, good C0/F2, 2/6 systolic murmur Resp: CTABL, no wheezes, non-labored Abd: SNTND, BS present, no guarding or organomegaly Ext: No edema, warm Neuro: Alert and oriented, No gross deficits  Assessment and plan:  # Type 2 diabetes A1c 7.7, reasonable control Continue current medications   # Atrial fibrillation Rate controlled, rhythm controlled - has follow-up with cardiology coming up in about one month Previous anemia, repeat CBC Continue anticoagulation  # Hypothyroidism Asymptomatic, continue Synthroid Repeat labs     Orders Placed This Encounter  Procedures  . Bayer DCA Hb A1c Waived  . CBC with Differential/Platelet  . CMP14+EGFR  . TSH  . Lipid panel     Laroy Apple, MD [facility] 02/16/2017, 10:45 AM

## 2017-02-17 LAB — CMP14+EGFR
A/G RATIO: 1.5 (ref 1.2–2.2)
ALBUMIN: 4.4 g/dL (ref 3.6–4.8)
ALT: 22 IU/L (ref 0–32)
AST: 28 IU/L (ref 0–40)
Alkaline Phosphatase: 109 IU/L (ref 39–117)
BILIRUBIN TOTAL: 0.5 mg/dL (ref 0.0–1.2)
BUN/Creatinine Ratio: 26 (ref 12–28)
BUN: 15 mg/dL (ref 8–27)
CHLORIDE: 95 mmol/L — AB (ref 96–106)
CO2: 24 mmol/L (ref 20–29)
Calcium: 9.5 mg/dL (ref 8.7–10.3)
Creatinine, Ser: 0.58 mg/dL (ref 0.57–1.00)
GFR calc non Af Amer: 96 mL/min/{1.73_m2} (ref >59)
GFR, EST AFRICAN AMERICAN: 110 mL/min/{1.73_m2} (ref >59)
GLOBULIN, TOTAL: 2.9 g/dL (ref 1.5–4.5)
Glucose: 262 mg/dL — ABNORMAL HIGH (ref 65–99)
POTASSIUM: 4.1 mmol/L (ref 3.5–5.2)
SODIUM: 136 mmol/L (ref 134–144)
TOTAL PROTEIN: 7.3 g/dL (ref 6.0–8.5)

## 2017-02-17 LAB — CBC WITH DIFFERENTIAL/PLATELET
Basophils Absolute: 0 10*3/uL (ref 0.0–0.2)
Basos: 0 %
EOS (ABSOLUTE): 0.2 10*3/uL (ref 0.0–0.4)
Eos: 2 %
HEMOGLOBIN: 11.8 g/dL (ref 11.1–15.9)
Hematocrit: 35.7 % (ref 34.0–46.6)
IMMATURE GRANS (ABS): 0.1 10*3/uL (ref 0.0–0.1)
Immature Granulocytes: 1 %
Lymphocytes Absolute: 1.1 10*3/uL (ref 0.7–3.1)
Lymphs: 12 %
MCH: 29.4 pg (ref 26.6–33.0)
MCHC: 33.1 g/dL (ref 31.5–35.7)
MCV: 89 fL (ref 79–97)
Monocytes Absolute: 0.6 10*3/uL (ref 0.1–0.9)
Monocytes: 7 %
NEUTROS ABS: 6.6 10*3/uL (ref 1.4–7.0)
Neutrophils: 78 %
PLATELETS: 232 10*3/uL (ref 150–379)
RBC: 4.02 x10E6/uL (ref 3.77–5.28)
RDW: 15.3 % (ref 12.3–15.4)
WBC: 8.5 10*3/uL (ref 3.4–10.8)

## 2017-02-17 LAB — LIPID PANEL
CHOLESTEROL TOTAL: 202 mg/dL — AB (ref 100–199)
Chol/HDL Ratio: 4.2 ratio (ref 0.0–4.4)
HDL: 48 mg/dL (ref >39)
LDL Calculated: 113 mg/dL — ABNORMAL HIGH (ref 0–99)
Triglycerides: 207 mg/dL — ABNORMAL HIGH (ref 0–149)
VLDL Cholesterol Cal: 41 mg/dL — ABNORMAL HIGH (ref 5–40)

## 2017-02-17 LAB — TSH: TSH: 2.84 u[IU]/mL (ref 0.450–4.500)

## 2017-02-18 ENCOUNTER — Other Ambulatory Visit: Payer: Self-pay | Admitting: Family Medicine

## 2017-02-18 NOTE — Telephone Encounter (Signed)
Note pt is on a statin, I appreciate nursing pointing this out.   See result note from today  No changes to the dose at this time.   Laroy Apple, MD [facility] 02/18/2017, 3:21 PM

## 2017-02-23 ENCOUNTER — Other Ambulatory Visit: Payer: Self-pay | Admitting: Family Medicine

## 2017-02-23 ENCOUNTER — Ambulatory Visit (HOSPITAL_COMMUNITY)
Admission: RE | Admit: 2017-02-23 | Discharge: 2017-02-23 | Disposition: A | Discharge status: Home / Self Care | Payer: Medicare Other | Source: Ambulatory Visit | Attending: Family Medicine | Admitting: Family Medicine

## 2017-02-23 ENCOUNTER — Telehealth: Payer: Self-pay | Admitting: Family Medicine

## 2017-02-23 DIAGNOSIS — E041 Nontoxic single thyroid nodule: Secondary | ICD-10-CM | POA: Insufficient documentation

## 2017-02-23 DIAGNOSIS — E049 Nontoxic goiter, unspecified: Secondary | ICD-10-CM | POA: Diagnosis not present

## 2017-02-23 DIAGNOSIS — E039 Hypothyroidism, unspecified: Secondary | ICD-10-CM | POA: Diagnosis not present

## 2017-02-23 MED ORDER — LOVASTATIN 20 MG PO TABS: 20.0000 mg | ORAL_TABLET | Freq: Every evening | ORAL | 3 refills | Status: DC

## 2017-02-23 NOTE — Telephone Encounter (Signed)
Patient aware that medication has been sent to [facility].  She also wanted to remind you she had an ultrasound today and is anxious to hear the results.  Radiologist told her you should have results this afternoon.

## 2017-02-23 NOTE — Telephone Encounter (Signed)
Is it ok to refill lovastatin for patient

## 2017-02-23 NOTE — Telephone Encounter (Signed)
Rx sent, she reports long term use.    Laroy Apple, MD [facility] 02/23/2017, 12:03 PM

## 2017-02-25 NOTE — Addendum Note (Signed)
Addended by: Lianne Cure A on: 02/25/2017 01:54 PM   Modules accepted: Orders

## 2017-03-06 ENCOUNTER — Encounter: Payer: Self-pay | Admitting: Family Medicine

## 2017-03-06 ENCOUNTER — Ambulatory Visit (INDEPENDENT_AMBULATORY_CARE_PROVIDER_SITE_OTHER): Payer: Medicare Other | Admitting: Family Medicine

## 2017-03-06 VITALS — BP 128/53 | HR 61 | Temp 97.0°F | Ht 62.0 in | Wt 251.0 lb

## 2017-03-06 DIAGNOSIS — N3090 Cystitis, unspecified without hematuria: Secondary | ICD-10-CM | POA: Diagnosis not present

## 2017-03-06 DIAGNOSIS — I6523 Occlusion and stenosis of bilateral carotid arteries: Secondary | ICD-10-CM | POA: Diagnosis not present

## 2017-03-06 DIAGNOSIS — R350 Frequency of micturition: Secondary | ICD-10-CM

## 2017-03-06 MED ORDER — SULFAMETHOXAZOLE-TRIMETHOPRIM 800-160 MG PO TABS: 1.0000 | ORAL_TABLET | Freq: Two times a day (BID) | ORAL | 0 refills | Status: DC

## 2017-03-06 NOTE — Progress Notes (Signed)
Chief Complaint  Patient presents with  . Urinary Tract Infection    discomfort    HPI  Patient presents today for burning with urination and frequency for several days. Denies fever . No flank pain. No nausea, vomiting.   PMH: Smoking status noted ROS: Per HPI  Objective: BP (!) 128/53 (BP Location: Right Arm, Cuff Size: Large)   Pulse 61   Temp (!) 97 F (36.1 C) (Oral)   Ht 5\' 2"  (1.575 m)   Wt 251 lb (113.9 kg)   BMI 45.91 kg/m  Gen: NAD, alert, cooperative with exam HEENT: NCAT, EOMI, PERRL CV: RRR, good S1/S2, no murmur Resp: CTABL, no wheezes, non-labored Abd: SNTND, BS present, no guarding or organomegaly Ext: No edema, warm Neuro: Alert and oriented, No gross deficits  Assessment and plan:  1. Cystitis   2. Frequency of micturition     Meds ordered this encounter  Medications  . sulfamethoxazole-trimethoprim (BACTRIM DS,SEPTRA DS) 800-160 MG tablet    Sig: Take 1 tablet by mouth 2 (two) times daily.    Dispense:  14 tablet    Refill:  0    Orders Placed This Encounter  Procedures  . Urine Culture  . Urinalysis    Follow up as needed.  Claretta Fraise, MD

## 2017-03-08 LAB — URINALYSIS
Bilirubin, UA: NEGATIVE
NITRITE UA: NEGATIVE
Protein, UA: NEGATIVE
Specific Gravity, UA: 1.015 (ref 1.005–1.030)
Urobilinogen, Ur: 0.2 mg/dL (ref 0.2–1.0)
pH, UA: 5.5 (ref 5.0–7.5)

## 2017-03-08 LAB — URINE CULTURE

## 2017-03-09 ENCOUNTER — Ambulatory Visit: Payer: Medicare Other | Admitting: Cardiology

## 2017-03-14 ENCOUNTER — Other Ambulatory Visit: Payer: Self-pay | Admitting: Cardiology

## 2017-03-16 DIAGNOSIS — B351 Tinea unguium: Secondary | ICD-10-CM | POA: Diagnosis not present

## 2017-03-16 DIAGNOSIS — L84 Corns and callosities: Secondary | ICD-10-CM | POA: Diagnosis not present

## 2017-03-16 DIAGNOSIS — E1142 Type 2 diabetes mellitus with diabetic polyneuropathy: Secondary | ICD-10-CM | POA: Diagnosis not present

## 2017-03-16 DIAGNOSIS — M79676 Pain in unspecified toe(s): Secondary | ICD-10-CM | POA: Diagnosis not present

## 2017-03-17 ENCOUNTER — Telehealth: Payer: Self-pay | Admitting: Cardiology

## 2017-03-17 NOTE — Telephone Encounter (Signed)
New message    Patient c/o Palpitations:  High priority if patient c/o lightheadedness, shortness of breath, or chest pain  1) How long have you had palpitations/irregular HR/ Afib? Are you having the symptoms now? Last night at 11:45pm. Not having symptoms now.  2) Are you currently experiencing lightheadedness, SOB or CP? No pt states she feels fine now.  3) Do you have a history of afib (atrial fibrillation) or irregular heart rhythm? Yes   4) Have you checked your BP or HR? (document readings if available): pt states her heart rate got up to 180 last night and she called the ambulance to her house.  5) Are you experiencing any other symptoms? No    Pt states she took her medication for her HR and it came down, she just wants Dr. Percival Spanish to know that she had an episode last night. She has an apt with him on Nov. 6th.

## 2017-03-17 NOTE — Telephone Encounter (Signed)
This appears to be an FYI only but I did call back patient to make sure nothing further needed. Her phone went directly to VM. I left msg for her to call if she would like to speak to nurse.

## 2017-03-17 NOTE — Telephone Encounter (Signed)
Pt of Dr. Percival Spanish Hx of A Fib  Called regarding fast HR.  Pt reports no recent change to medications. She is on matzim 180mg  and flecainide for regular rate control. She is also anticoagulated on Eliquis 5mg  BID  She reports 2 isolated events of A Fib/ rapid rate. 1st event occurred around midnight last night, 2nd event was around 2am. She called 911 for 2nd event, took a 30mg  diltiazem which she uses PRN. Rate resolved in 10 minutes, by the time EMS evaluated, she was in normal rate/rhythm. No other symptoms. Vital signs were stable on EMS assessment. She doesn't know if the diltiazem helped or if she just self-corrected.  Pt has 6 mo f/u on 11/6. Recommended she continue current medical therapy, continue to use diltiazem 30mg  if needed for breakthrough A Fib, and to call if she has another episode. Advised pt to keep f/u appt as scheduled, would see if Dr. Percival Spanish recommends sooner appt. Pt expressed understanding and thanks for call.

## 2017-03-24 ENCOUNTER — Telehealth: Payer: Self-pay | Admitting: Family Medicine

## 2017-03-25 MED ORDER — SLOW IRON 160 (50 FE) MG PO TBCR
1.0000 | EXTENDED_RELEASE_TABLET | Freq: Every day | ORAL | 5 refills | Status: DC
Start: 2017-03-25 — End: 2017-06-29

## 2017-03-25 NOTE — Telephone Encounter (Signed)
oik with refill X 6 months.   Laroy Apple, MD [facility] 03/25/2017, 2:02 PM

## 2017-03-25 NOTE — Telephone Encounter (Signed)
Pt aware.

## 2017-03-29 ENCOUNTER — Other Ambulatory Visit: Payer: Self-pay | Admitting: Cardiology

## 2017-03-29 NOTE — Telephone Encounter (Signed)
REFILL 

## 2017-03-30 ENCOUNTER — Encounter: Payer: Self-pay | Admitting: Family Medicine

## 2017-03-30 ENCOUNTER — Ambulatory Visit (INDEPENDENT_AMBULATORY_CARE_PROVIDER_SITE_OTHER): Payer: Medicare Other | Admitting: Family Medicine

## 2017-03-30 VITALS — BP 133/54 | HR 54 | Temp 97.3°F | Ht 62.0 in | Wt 257.0 lb

## 2017-03-30 DIAGNOSIS — I6523 Occlusion and stenosis of bilateral carotid arteries: Secondary | ICD-10-CM | POA: Diagnosis not present

## 2017-03-30 DIAGNOSIS — N3000 Acute cystitis without hematuria: Secondary | ICD-10-CM

## 2017-03-30 DIAGNOSIS — R300 Dysuria: Secondary | ICD-10-CM | POA: Diagnosis not present

## 2017-03-30 MED ORDER — CEPHALEXIN 500 MG PO CAPS: 500.0000 mg | ORAL_CAPSULE | Freq: Three times a day (TID) | ORAL | 0 refills | Status: DC

## 2017-03-30 NOTE — Progress Notes (Signed)
   HPI  Patient presents today here with concern for UTI.  Patient states that she has had discomfort with urination, difficulty starting urination.  She denies fever, chills, sweats.  Is tolerating food and fluids like usual.  She has history of frequent UTIs and has follow-up with urology coming up in a few weeks.  He has recently been treated with Bactrim and Omnicef.  PMH: Smoking status noted ROS: Per HPI  Objective: BP (!) 133/54   Pulse (!) 54   Temp (!) 97.3 F (36.3 C) (Oral)   Ht 5\' 2"  (1.575 m)   Wt 257 lb (116.6 kg)   BMI 47.01 kg/m  Gen: NAD, alert, cooperative with exam HEENT: NCAT CV: RRR, good S1/S2, no murmur Resp: CTABL, no wheezes, non-labored Abd: SNTND, BS present, no guarding or organomegaly, no CVA tenderness Ext: No edema, warm Neuro: Alert and oriented, No gross deficits  Assessment and plan:  #UTI, frequent UTI Treat with Keflex Culture Patient has multiple almost monthly UTIs since July.  She also describes a recent event with heart rate in the 180s, resolves quickly with diltiazem.  EMS was called, she has not had any recurrence.    Orders Placed This Encounter  Procedures  . Urine Culture  . Urinalysis    Meds ordered this encounter  Medications  . cephALEXin (KEFLEX) 500 MG capsule    Sig: Take 1 capsule (500 mg total) by mouth 3 (three) times daily.    Dispense:  21 capsule    Refill:  0    Laroy Apple, MD [facility] 03/30/2017, 6:11 PM

## 2017-03-30 NOTE — Patient Instructions (Signed)
Great to see you!   Urinary Tract Infection, Adult A urinary tract infection (UTI) is an infection of any part of the urinary tract, which includes the kidneys, ureters, bladder, and urethra. These organs make, store, and get rid of urine in the body. UTI can be a bladder infection (cystitis) or kidney infection (pyelonephritis). What are the causes? This infection may be caused by fungi, viruses, or bacteria. Bacteria are the most common cause of UTIs. This condition can also be caused by repeated incomplete emptying of the bladder during urination. What increases the risk? This condition is more likely to develop if:  You ignore your need to urinate or hold urine for long periods of time.  You do not empty your bladder completely during urination.  You wipe back to front after urinating or having a bowel movement, if you are female.  You are uncircumcised, if you are female.  You are constipated.  You have a urinary catheter that stays in place (indwelling).  You have a weak defense (immune) system.  You have a medical condition that affects your bowels, kidneys, or bladder.  You have diabetes.  You take antibiotic medicines frequently or for long periods of time, and the antibiotics no longer work well against certain types of infections (antibiotic resistance).  You take medicines that irritate your urinary tract.  You are exposed to chemicals that irritate your urinary tract.  You are female.  What are the signs or symptoms? Symptoms of this condition include:  Fever.  Frequent urination or passing small amounts of urine frequently.  Needing to urinate urgently.  Pain or burning with urination.  Urine that smells bad or unusual.  Cloudy urine.  Pain in the lower abdomen or back.  Trouble urinating.  Blood in the urine.  Vomiting or being less hungry than normal.  Diarrhea or abdominal pain.  Vaginal discharge, if you are female.  How is this  diagnosed? This condition is diagnosed with a medical history and physical exam. You will also need to provide a urine sample to test your urine. Other tests may be done, including:  Blood tests.  Sexually transmitted disease (STD) testing.  If you have had more than one UTI, a cystoscopy or imaging studies may be done to determine the cause of the infections. How is this treated? Treatment for this condition often includes a combination of two or more of the following:  Antibiotic medicine.  Other medicines to treat less common causes of UTI.  Over-the-counter medicines to treat pain.  Drinking enough water to stay hydrated.  Follow these instructions at home:  Take over-the-counter and prescription medicines only as told by your health care provider.  If you were prescribed an antibiotic, take it as told by your health care provider. Do not stop taking the antibiotic even if you start to feel better.  Avoid alcohol, caffeine, tea, and carbonated beverages. They can irritate your bladder.  Drink enough fluid to keep your urine clear or pale yellow.  Keep all follow-up visits as told by your health care provider. This is important.  Make sure to: ? Empty your bladder often and completely. Do not hold urine for long periods of time. ? Empty your bladder before and after sex. ? Wipe from front to back after a bowel movement if you are female. Use each tissue one time when you wipe. Contact a health care provider if:  You have back pain.  You have a fever.  You feel nauseous or   vomit.  Your symptoms do not get better after 3 days.  Your symptoms go away and then return. Get help right away if:  You have severe back pain or lower abdominal pain.  You are vomiting and cannot keep down any medicines or water. This information is not intended to replace advice given to you by your health care provider. Make sure you discuss any questions you have with your health care  provider. Document Released: 02/25/2005 Document Revised: 10/30/2015 Document Reviewed: 04/08/2015 Elsevier Interactive Patient Education  2017 [employer].

## 2017-03-30 NOTE — Addendum Note (Signed)
Addended by: Timmothy Euler on: 03/30/2017 06:29 PM   Modules accepted: Orders

## 2017-03-31 LAB — URINALYSIS
Bilirubin, UA: NEGATIVE
Glucose, UA: NEGATIVE
Ketones, UA: NEGATIVE
Nitrite, UA: NEGATIVE
Protein, UA: NEGATIVE
Specific Gravity, UA: 1.025 (ref 1.005–1.030)
Urobilinogen, Ur: 0.2 mg/dL (ref 0.2–1.0)
pH, UA: 5 (ref 5.0–7.5)

## 2017-04-02 ENCOUNTER — Telehealth: Payer: Self-pay | Admitting: *Deleted

## 2017-04-02 ENCOUNTER — Other Ambulatory Visit: Payer: Self-pay | Admitting: Family Medicine

## 2017-04-02 LAB — URINE CULTURE

## 2017-04-02 MED ORDER — CIPROFLOXACIN HCL 500 MG PO TABS: 500.0000 mg | ORAL_TABLET | Freq: Two times a day (BID) | ORAL | 0 refills | Status: DC

## 2017-04-02 MED ORDER — AMOXICILLIN-POT CLAVULANATE 875-125 MG PO TABS: 1.0000 | ORAL_TABLET | Freq: Two times a day (BID) | ORAL | 0 refills | Status: DC

## 2017-04-02 NOTE — Telephone Encounter (Signed)
[facility] called and states that there is a drug interaction between cipro and flecainide prolonged qt interval. Per Dr. Evette Doffing change to augmentin 875mg  BID for 7 days

## 2017-04-06 ENCOUNTER — Ambulatory Visit: Payer: Medicare Other | Admitting: Cardiology

## 2017-04-09 ENCOUNTER — Ambulatory Visit: Payer: Medicare Other | Admitting: Cardiology

## 2017-04-13 DIAGNOSIS — N952 Postmenopausal atrophic vaginitis: Secondary | ICD-10-CM | POA: Diagnosis not present

## 2017-04-13 DIAGNOSIS — N8500 Endometrial hyperplasia, unspecified: Secondary | ICD-10-CM | POA: Diagnosis not present

## 2017-04-13 DIAGNOSIS — Z1231 Encounter for screening mammogram for malignant neoplasm of breast: Secondary | ICD-10-CM | POA: Diagnosis not present

## 2017-04-14 DIAGNOSIS — N8500 Endometrial hyperplasia, unspecified: Secondary | ICD-10-CM | POA: Diagnosis not present

## 2017-04-16 ENCOUNTER — Other Ambulatory Visit: Payer: Self-pay | Admitting: Family Medicine

## 2017-04-20 ENCOUNTER — Other Ambulatory Visit: Payer: Medicare Other

## 2017-04-20 DIAGNOSIS — N390 Urinary tract infection, site not specified: Secondary | ICD-10-CM | POA: Diagnosis not present

## 2017-04-20 LAB — URINALYSIS, COMPLETE
BILIRUBIN UA: NEGATIVE
KETONES UA: NEGATIVE
LEUKOCYTES UA: NEGATIVE
NITRITE UA: NEGATIVE
PH UA: 6 (ref 5.0–7.5)
Protein, UA: NEGATIVE
RBC UA: NEGATIVE
SPEC GRAV UA: 1.02 (ref 1.005–1.030)
UUROB: 0.2 mg/dL (ref 0.2–1.0)

## 2017-04-20 LAB — MICROSCOPIC EXAMINATION: Renal Epithel, UA: NONE SEEN /hpf

## 2017-04-21 ENCOUNTER — Telehealth: Payer: Self-pay | Admitting: Family Medicine

## 2017-04-21 LAB — URINE CULTURE

## 2017-04-21 NOTE — Telephone Encounter (Signed)
Patient notified of lab results

## 2017-04-23 ENCOUNTER — Other Ambulatory Visit: Payer: Self-pay | Admitting: *Deleted

## 2017-04-23 MED ORDER — LOSARTAN POTASSIUM 50 MG PO TABS: 50.0000 mg | ORAL_TABLET | Freq: Every day | ORAL | 0 refills | Status: DC

## 2017-04-26 NOTE — Progress Notes (Signed)
HPI The patient presents for followup of atrial fib.  The patient has paroxysmal atrial fibrillation and is being treated with flecainide. Since I last saw her she called last month with recurrent short bouts of atrial fib.  She called EMS for one of these.  She says that she was told she was not in atrial fibrillation with better heart rate was 180.  I do not have any rhythm strips.  She said she took Cardizem 30 mg and it stopped soon thereafter.  She was not transported.  She said it felt different than her fibrillation.  It was racing.  She denies any presyncope or syncope.  There was no chest discomfort, neck or arm discomfort.  It was the only episode she has had other than when she had to be cardioverted.  She otherwise has felt okay except she is describing some light walking.  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    Allergies  Allergen Reactions  . Quinapril Hcl Cough  . Statins Other (See Comments)    Not all Statins but some cause cough and pain in legs.  . Tape Other (See Comments)    Redness, please use "paper" tape    Current Outpatient Medications  Medication Sig Dispense Refill  . amoxicillin-clavulanate (AUGMENTIN) 875-125 MG tablet Take 1 tablet by mouth 2 (two) times daily. 14 tablet 0  . cephALEXin (KEFLEX) 500 MG capsule Take 1 capsule (500 mg total) by mouth 3 (three) times daily. 21 capsule 0  . chlorthalidone (HYGROTON) 25 MG tablet Take 1 tablet (25 mg total) by mouth daily. 90 tablet 3  . ciprofloxacin (CIPRO) 500 MG tablet Take 1 tablet (500 mg total) by mouth 2 (two) times daily. 14 tablet 0  . diltiazem (CARDIZEM) 30 MG tablet Take 30 mg by mouth daily.    Marland Kitchen ELIQUIS 5 MG TABS tablet TAKE 1 TABLET (5 MG TOTAL) BY MOUTH 2 (TWO) TIMES DAILY. 60 tablet 5  . flecainide (TAMBOCOR) 150 MG tablet TAKE 0.5 TABLETS (75 MG TOTAL) BY MOUTH 2 (TWO) TIMES DAILY. 90 tablet 3  .  glipiZIDE (GLUCOTROL) 5 MG tablet TAKE 1 TABLET BY MOUTH TWICE A DAY 180 tablet 3  . ibuprofen (ADVIL,MOTRIN) 200 MG tablet Take 600 mg by mouth daily as needed for moderate pain.    Marland Kitchen levothyroxine (SYNTHROID, LEVOTHROID) 112 MCG tablet Take 56-168 mcg by mouth See admin instructions. TAKES ONE TABLET DAILY. TAKES ONE-HALF TABLET IN ADDITION, EVERY OTHER DAY ONLY    . losartan (COZAAR) 50 MG tablet Take 1 tablet (50 mg total) by mouth daily. 90 tablet 0  . lovastatin (MEVACOR) 20 MG tablet Take 1 tablet (20 mg total) by mouth every evening. 90 tablet 3  . MATZIM LA 180 MG 24 hr tablet TAKE ONE TABLET BY MOUTH ONE TIME DAILY 60 tablet 1  . metFORMIN (GLUCOPHAGE) 500 MG tablet Take 500 mg by mouth 2 (two) times daily.    . metoprolol succinate (TOPROL-XL) 100 MG 24 hr tablet Take 100 mg by mouth daily.    Marland Kitchen SLOW IRON 160 (50 Fe) MG TBCR SR tablet Take 1 tablet (160 mg total) by mouth daily. 30 each 5  . traMADol (ULTRAM) 50 MG tablet Take 1 tablet (50 mg total) by mouth every 6 (six) hours as needed for moderate pain or severe pain. 30 tablet 0  . furosemide (  LASIX) 20 MG tablet Take 20 mg by mouth daily as needed for fluid.      No current facility-administered medications for this visit.     Past Medical History:  Diagnosis Date  . Anemia   . Arthritis   . Bilateral carotid artery disease ([place])   . Diabetes mellitus without complication ([place])   . Difficult intubation    states 'lady that did the sleep study told me I have the smallest airway she has ever seen in an adult"  . Dysrhythmia   . Heart murmur   . Hypertension   . Hypothyroid   . Obesity   . Obstructive sleep apnea    on C Pap  . PAF (paroxysmal atrial fibrillation) ([place])    a. newly dx in 09/2013; on eliquis  . UTI (urinary tract infection)     Past Surgical History:  Procedure Laterality Date  . CATARACT EXTRACTION Left   . CATARACT EXTRACTION W/PHACO Right 12/09/2015   Procedure: CATARACT EXTRACTION PHACO AND  INTRAOCULAR LENS PLACEMENT (IOC);  Surgeon: Tonny Branch, MD;  Location: [facility];  Service: Ophthalmology;  Laterality: Right;  CDE:10.48  . COLONOSCOPY W/ POLYPECTOMY    . cyst on back of neck removed    . DILATION AND CURETTAGE OF UTERUS     x 5  . ENDARTERECTOMY Left 08/30/2015   Procedure: LEFT CAROYID ENDARTERECTOMY WITH XENOSURE BOVINE PERICARDIUM PATCH ANGIOPLASTY;  Surgeon: [name], MD;  Location: [facility];  Service: Vascular;  Laterality: Left;  . EYE SURGERY    . TONSILLECTOMY      ROS:    She cannot hit the high notes at church since her CEA.  Otherwise as stated in the HPI and negative for all other systems.  PHYSICAL EXAM BP (!) 140/50   Pulse 60   Ht 5\' 2"  (1.575 m)   Wt 249 lb (112.9 kg)   SpO2 97%   BMI 45.54 kg/m   GENERAL:  Well appearing NECK:  No jugular venous distention, waveform within normal limits, carotid upstroke brisk and symmetric, bilateral bruits, no thyromegaly LUNGS:  Clear to auscultation bilaterally CHEST:  Unremarkable HEART:  PMI not displaced or sustained,S1 and S2 within normal limits, no S3, no S4, no clicks, no rubs, 2 out of 6 early peaking apical systolic murmur radiating slightly at the aortic outflow tract, no diastolic murmurs ABD:  Flat, positive bowel sounds normal in frequency in pitch, no bruits, no rebound, no guarding, no midline pulsatile mass, no hepatomegaly, no splenomegaly EXT:  2 plus pulses upper mildly decreased dorsalis pedis bilaterally and absent posterior tibialis bilaterally, mild leg edema, no cyanosis no clubbing   EKG:   Sinus rhythm, rate 57, axis leftward , intervals within normal limits , no acute ST-T wave changes, nonspecific inferior T wave changes unchanged from previous.  04/27/2017   Lab Results  Component Value Date   CHOL 202 (H) 02/16/2017   TRIG 207 (H) 02/16/2017   HDL 48 02/16/2017   LDLCALC 113 (H) 02/16/2017    ASSESSMENT AND PLAN  ATRIAL FIB:   [name] has a CHA2DS2 - VASc  score of 4.  She has only had one breakthrough of this arrhythmia on the current dose of flecainide.  We talked about taking as needed Cardizem.  If she has more breakthroughs I would increase the dose of flecainide.  She will continue with current anticoagulation.  HTN:  BP is at the upper limits of acceptable.  She can watch  this with her home readings.  BRUIT:  She follows with vascular surgery.   OBESITY:   The patient understands the need to lose weight with diet and exercise. We have discussed specific strategies for this.  LEG PAIN:   Will check ABIs.    DM: A1c this fall was 7.7 which is better than previous.  No change in therapy.   DYSLIPIDEMIA:  She is not at target.  I have asked her to review this with Timmothy Euler, MD.  She should have med adjustment with target LDL of 70.  MURMUR: Echo last year demonstrated no significant valvular disease.  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  No change in therapy is indicated.

## 2017-04-27 ENCOUNTER — Encounter: Payer: Self-pay | Admitting: Cardiology

## 2017-04-27 ENCOUNTER — Ambulatory Visit (INDEPENDENT_AMBULATORY_CARE_PROVIDER_SITE_OTHER): Payer: Medicare Other | Admitting: Urology

## 2017-04-27 ENCOUNTER — Ambulatory Visit (INDEPENDENT_AMBULATORY_CARE_PROVIDER_SITE_OTHER): Payer: Medicare Other | Admitting: Cardiology

## 2017-04-27 ENCOUNTER — Other Ambulatory Visit (HOSPITAL_COMMUNITY)
Admission: AD | Admit: 2017-04-27 | Discharge: 2017-04-27 | Disposition: A | Discharge status: Home / Self Care | Payer: Medicare Other | Source: Skilled Nursing Facility | Attending: Urology | Admitting: Urology

## 2017-04-27 VITALS — BP 140/50 | HR 60 | Ht 62.0 in | Wt 249.0 lb

## 2017-04-27 DIAGNOSIS — I10 Essential (primary) hypertension: Secondary | ICD-10-CM

## 2017-04-27 DIAGNOSIS — R011 Cardiac murmur, unspecified: Secondary | ICD-10-CM

## 2017-04-27 DIAGNOSIS — I6523 Occlusion and stenosis of bilateral carotid arteries: Secondary | ICD-10-CM

## 2017-04-27 DIAGNOSIS — E785 Hyperlipidemia, unspecified: Secondary | ICD-10-CM | POA: Diagnosis not present

## 2017-04-27 DIAGNOSIS — N3000 Acute cystitis without hematuria: Secondary | ICD-10-CM

## 2017-04-27 DIAGNOSIS — I739 Peripheral vascular disease, unspecified: Secondary | ICD-10-CM

## 2017-04-27 DIAGNOSIS — N952 Postmenopausal atrophic vaginitis: Secondary | ICD-10-CM

## 2017-04-27 DIAGNOSIS — I480 Paroxysmal atrial fibrillation: Secondary | ICD-10-CM

## 2017-04-27 NOTE — Patient Instructions (Signed)
Medication Instructions:  Continue current medications  If you need a refill on your cardiac medications before your next appointment, please call your pharmacy.  Labwork: None Ordered    Testing/Procedures: Your physician has requested that you have an ankle brachial index (ABI). During this test an ultrasound and blood pressure cuff are used to evaluate the arteries that supply the arms and legs with blood. Allow thirty minutes for this exam. There are no restrictions or special instructions.   Follow-Up: Your physician wants you to follow-up in: 2 Months.    Thank you for choosing [facility] at [facility]!!

## 2017-04-28 ENCOUNTER — Other Ambulatory Visit: Payer: Self-pay | Admitting: Family Medicine

## 2017-04-30 LAB — URINE CULTURE

## 2017-05-04 ENCOUNTER — Ambulatory Visit (INDEPENDENT_AMBULATORY_CARE_PROVIDER_SITE_OTHER): Payer: Medicare Other | Admitting: Family Medicine

## 2017-05-04 ENCOUNTER — Encounter: Payer: Self-pay | Admitting: Family Medicine

## 2017-05-04 VITALS — BP 116/54 | HR 63 | Temp 97.7°F | Ht 62.0 in | Wt 246.4 lb

## 2017-05-04 DIAGNOSIS — F5089 Other specified eating disorder: Secondary | ICD-10-CM

## 2017-05-04 DIAGNOSIS — R42 Dizziness and giddiness: Secondary | ICD-10-CM | POA: Diagnosis not present

## 2017-05-04 DIAGNOSIS — L292 Pruritus vulvae: Secondary | ICD-10-CM

## 2017-05-04 DIAGNOSIS — I6523 Occlusion and stenosis of bilateral carotid arteries: Secondary | ICD-10-CM

## 2017-05-04 DIAGNOSIS — D649 Anemia, unspecified: Secondary | ICD-10-CM

## 2017-05-04 MED ORDER — NYSTATIN 100000 UNIT/GM EX CREA: 1.0000 "application " | TOPICAL_CREAM | Freq: Two times a day (BID) | CUTANEOUS | 1 refills | Status: DC

## 2017-05-04 MED ORDER — FLUCONAZOLE 150 MG PO TABS: ORAL_TABLET | ORAL | 0 refills | Status: DC

## 2017-05-04 NOTE — Progress Notes (Addendum)
   HPI  Patient presents today here with lightheadedness, itching, and pica.  Patient states that over the last few weeks and months she has noticed that she is eating a lot more ice and and craving ice. She is concerned about anemia. She denies any sources of bleeding. She has chronically dark stools due to iron usage, however she stopped this for about 3 days and had normal return of stool.  Patient states that Saturday she had an episode of lightheadedness that seem to last all day, since that time she is been going off and on with lightheadedness. No room spinning, no loss of balance, no headaches.  Vulvar and perirectal and perineal itching has been going on for about 2 weeks.  No obvious rash.   PMH: Smoking status noted ROS: Per HPI  Objective: BP (!) 116/54   Pulse 63   Temp 97.7 F (36.5 C) (Oral)   Ht 5\' 2"  (1.575 m)   Wt 246 lb 6.4 oz (111.8 kg)   BMI 45.07 kg/m  Gen: NAD, alert, cooperative with exam HEENT: NCAT CV: RRR, good S1/S2 Resp: CTABL, no wheezes, non-labored Ext: No edema, warm Neuro: Alert and oriented, No gross deficits  Assessment and plan:  #Lightheadedness Unclear etiology, no signs of sinus pressure currently.  Patient previously with history of CEA on the left side due to 85% stenosis, so I believe this is unlikely to be related to her carotid arteries. Watchful waiting, she is concerned about anemia  #Pica Mild to moderate Patient is already being replaced with iron, repeating CBC plus iron studies  #Vulvar itching Patient with about 2 weeks of vulvar itching after multiple rounds of antibiotics given for UTI. Diflucan given, hold lovastatin times 1 day with each pill Also nystatin cream given     Orders Placed This Encounter  Procedures  . CBC with Differential/Platelet  . Ferritin  . Iron    Meds ordered this encounter  Medications  . fluconazole (DIFLUCAN) 150 MG tablet    Sig: Take one pill and repeat in 3 days   Dispense:  2 tablet    Refill:  0  . nystatin cream (MYCOSTATIN)    Sig: Apply 1 application topically 2 (two) times daily.    Dispense:  30 g    Refill:  Joy, MD [facility] 05/04/2017, 11:54 AM  Addendum  Due to QTC interaction with flecainide will try nystatin BID X 1 week instead of diflucan.   Laroy Apple, MD [facility] 05/04/2017, 1:34 PM

## 2017-05-04 NOTE — Patient Instructions (Signed)
Great to see you!  Start diflucan for the itching, you can use the nystatin cream as needed  We will call or send your labs on mychart within 1 week.

## 2017-05-04 NOTE — Addendum Note (Signed)
Addended by: Timmothy Euler on: 05/04/2017 01:34 PM   Modules accepted: Orders

## 2017-05-05 LAB — FERRITIN: Ferritin: 23 ng/mL (ref 15–150)

## 2017-05-05 LAB — CBC WITH DIFFERENTIAL/PLATELET
BASOS ABS: 0.1 10*3/uL (ref 0.0–0.2)
BASOS: 1 %
EOS (ABSOLUTE): 0.2 10*3/uL (ref 0.0–0.4)
Eos: 3 %
Hematocrit: 30.5 % — ABNORMAL LOW (ref 34.0–46.6)
Hemoglobin: 9.5 g/dL — ABNORMAL LOW (ref 11.1–15.9)
Immature Grans (Abs): 0.1 10*3/uL (ref 0.0–0.1)
Immature Granulocytes: 1 %
Lymphocytes Absolute: 1 10*3/uL (ref 0.7–3.1)
Lymphs: 11 %
MCH: 28.7 pg (ref 26.6–33.0)
MCHC: 31.1 g/dL — ABNORMAL LOW (ref 31.5–35.7)
MCV: 92 fL (ref 79–97)
MONOS ABS: 0.7 10*3/uL (ref 0.1–0.9)
Monocytes: 8 %
NEUTROS PCT: 76 %
Neutrophils Absolute: 7 10*3/uL (ref 1.4–7.0)
PLATELETS: 280 10*3/uL (ref 150–379)
RBC: 3.31 x10E6/uL — ABNORMAL LOW (ref 3.77–5.28)
RDW: 16 % — AB (ref 12.3–15.4)
WBC: 9 10*3/uL (ref 3.4–10.8)

## 2017-05-05 LAB — IRON: Iron: 78 ug/dL (ref 27–139)

## 2017-05-06 NOTE — Addendum Note (Signed)
Addended by: Nigel Berthold C on: 05/06/2017 01:35 PM   Modules accepted: Orders

## 2017-05-12 ENCOUNTER — Other Ambulatory Visit: Payer: Self-pay

## 2017-05-12 MED ORDER — METOPROLOL SUCCINATE ER 100 MG PO TB24: 100.0000 mg | ORAL_TABLET | Freq: Every day | ORAL | 1 refills | Status: DC

## 2017-05-12 MED ORDER — GLIPIZIDE 5 MG PO TABS: 5.0000 mg | ORAL_TABLET | Freq: Two times a day (BID) | ORAL | 0 refills | Status: DC

## 2017-05-13 ENCOUNTER — Other Ambulatory Visit: Payer: Self-pay | Admitting: Cardiology

## 2017-05-13 DIAGNOSIS — I739 Peripheral vascular disease, unspecified: Secondary | ICD-10-CM

## 2017-05-18 ENCOUNTER — Encounter (HOSPITAL_COMMUNITY): Payer: Medicare Other

## 2017-05-18 ENCOUNTER — Encounter (HOSPITAL_COMMUNITY): Payer: Medicare Other | Attending: Oncology | Admitting: Oncology

## 2017-05-18 ENCOUNTER — Encounter (HOSPITAL_COMMUNITY): Payer: Self-pay

## 2017-05-18 ENCOUNTER — Other Ambulatory Visit: Payer: Self-pay

## 2017-05-18 VITALS — BP 149/50 | HR 75 | Temp 97.8°F | Resp 18 | Ht 62.5 in | Wt 248.5 lb

## 2017-05-18 DIAGNOSIS — Z6841 Body Mass Index (BMI) 40.0 and over, adult: Secondary | ICD-10-CM | POA: Diagnosis not present

## 2017-05-18 DIAGNOSIS — E669 Obesity, unspecified: Secondary | ICD-10-CM | POA: Insufficient documentation

## 2017-05-18 DIAGNOSIS — G4733 Obstructive sleep apnea (adult) (pediatric): Secondary | ICD-10-CM | POA: Insufficient documentation

## 2017-05-18 DIAGNOSIS — E039 Hypothyroidism, unspecified: Secondary | ICD-10-CM | POA: Insufficient documentation

## 2017-05-18 DIAGNOSIS — M1990 Unspecified osteoarthritis, unspecified site: Secondary | ICD-10-CM | POA: Insufficient documentation

## 2017-05-18 DIAGNOSIS — Z825 Family history of asthma and other chronic lower respiratory diseases: Secondary | ICD-10-CM | POA: Diagnosis not present

## 2017-05-18 DIAGNOSIS — Z7984 Long term (current) use of oral hypoglycemic drugs: Secondary | ICD-10-CM | POA: Diagnosis not present

## 2017-05-18 DIAGNOSIS — I480 Paroxysmal atrial fibrillation: Secondary | ICD-10-CM | POA: Insufficient documentation

## 2017-05-18 DIAGNOSIS — Z87440 Personal history of urinary (tract) infections: Secondary | ICD-10-CM | POA: Insufficient documentation

## 2017-05-18 DIAGNOSIS — Z8249 Family history of ischemic heart disease and other diseases of the circulatory system: Secondary | ICD-10-CM | POA: Diagnosis not present

## 2017-05-18 DIAGNOSIS — E119 Type 2 diabetes mellitus without complications: Secondary | ICD-10-CM | POA: Insufficient documentation

## 2017-05-18 DIAGNOSIS — D508 Other iron deficiency anemias: Secondary | ICD-10-CM

## 2017-05-18 DIAGNOSIS — I10 Essential (primary) hypertension: Secondary | ICD-10-CM | POA: Insufficient documentation

## 2017-05-18 DIAGNOSIS — Z888 Allergy status to other drugs, medicaments and biological substances status: Secondary | ICD-10-CM | POA: Diagnosis not present

## 2017-05-18 DIAGNOSIS — D649 Anemia, unspecified: Secondary | ICD-10-CM | POA: Diagnosis not present

## 2017-05-18 DIAGNOSIS — Z79890 Hormone replacement therapy (postmenopausal): Secondary | ICD-10-CM | POA: Diagnosis not present

## 2017-05-18 DIAGNOSIS — Z79899 Other long term (current) drug therapy: Secondary | ICD-10-CM | POA: Insufficient documentation

## 2017-05-18 DIAGNOSIS — Z7901 Long term (current) use of anticoagulants: Secondary | ICD-10-CM | POA: Diagnosis not present

## 2017-05-18 LAB — COMPREHENSIVE METABOLIC PANEL
ALBUMIN: 3.8 g/dL (ref 3.5–5.0)
ALT: 22 U/L (ref 14–54)
ANION GAP: 12 (ref 5–15)
AST: 36 U/L (ref 15–41)
Alkaline Phosphatase: 85 U/L (ref 38–126)
BUN: 29 mg/dL — AB (ref 6–20)
CHLORIDE: 95 mmol/L — AB (ref 101–111)
CO2: 24 mmol/L (ref 22–32)
Calcium: 8.8 mg/dL — ABNORMAL LOW (ref 8.9–10.3)
Creatinine, Ser: 0.67 mg/dL (ref 0.44–1.00)
GFR calc Af Amer: >60 mL/min (ref >60)
GFR calc non Af Amer: >60 mL/min (ref >60)
GLUCOSE: 336 mg/dL — AB (ref 65–99)
POTASSIUM: 4.2 mmol/L (ref 3.5–5.1)
Sodium: 131 mmol/L — ABNORMAL LOW (ref 135–145)
Total Bilirubin: 0.3 mg/dL (ref 0.3–1.2)
Total Protein: 7.1 g/dL (ref 6.5–8.1)

## 2017-05-18 LAB — RETICULOCYTES
RBC.: 2.91 MIL/uL — ABNORMAL LOW (ref 3.87–5.11)
RETIC CT PCT: 8.1 % — AB (ref 0.4–3.1)
Retic Count, Absolute: 235.7 10*3/uL — ABNORMAL HIGH (ref 19.0–186.0)

## 2017-05-18 LAB — CBC WITH DIFFERENTIAL/PLATELET
Basophils Absolute: 0 10*3/uL (ref 0.0–0.1)
Basophils Relative: 0 %
Eosinophils Absolute: 0.2 10*3/uL (ref 0.0–0.7)
Eosinophils Relative: 2 %
HEMATOCRIT: 26.8 % — AB (ref 36.0–46.0)
Hemoglobin: 8.1 g/dL — ABNORMAL LOW (ref 12.0–15.0)
Lymphocytes Relative: 11 %
Lymphs Abs: 1.1 10*3/uL (ref 0.7–4.0)
MCH: 27.8 pg (ref 26.0–34.0)
MCHC: 30.2 g/dL (ref 30.0–36.0)
MCV: 92.1 fL (ref 78.0–100.0)
MONO ABS: 0.6 10*3/uL (ref 0.1–1.0)
MONOS PCT: 6 %
NEUTROS ABS: 8 10*3/uL — AB (ref 1.7–7.7)
Neutrophils Relative %: 81 %
Platelets: 250 10*3/uL (ref 150–400)
RBC: 2.91 MIL/uL — ABNORMAL LOW (ref 3.87–5.11)
RDW: 16.8 % — AB (ref 11.5–15.5)
WBC: 9.8 10*3/uL (ref 4.0–10.5)

## 2017-05-18 LAB — FOLATE: Folate: 30 ng/mL (ref >5.9)

## 2017-05-18 LAB — IRON AND TIBC
IRON: 273 ug/dL — AB (ref 28–170)
SATURATION RATIOS: 58 % — AB (ref 10.4–31.8)
TIBC: 470 ug/dL — ABNORMAL HIGH (ref 250–450)
UIBC: 197 ug/dL

## 2017-05-18 LAB — LACTATE DEHYDROGENASE: LDH: 126 U/L (ref 98–192)

## 2017-05-18 LAB — VITAMIN B12: Vitamin B-12: 239 pg/mL (ref 180–914)

## 2017-05-18 LAB — FERRITIN: FERRITIN: 9 ng/mL — AB (ref 11–307)

## 2017-05-18 NOTE — Progress Notes (Signed)
[facility] Initial Visit:  Patient Care Team: Timmothy Euler, MD as PCP - General (Family Medicine) [name], MD as Referring Physician (Family Medicine) Minus Breeding, MD as Consulting Physician (Cardiology)  CHIEF COMPLAINTS/PURPOSE OF CONSULTATION:  Chronic normocytic anemia  HISTORY OF PRESENTING ILLNESS: Emily [name] 66 y.o. female presents today for evaluation chronic normocytic anemia.  Patient had blood work done on 05/04/2017 which demonstrated WBC 9K, hemoglobin 9.5 g/dL, hematocrit 30.5%, MCV 92, platelet count 280 K.  Hemoglobin on 02/16/2017 was 11.8 g/dL.  Hemoglobin from 1 year ago on 12/04/15 was 9.5 g/dL.  Patient states that she feels chronically fatigued.  She denies any evidence of bleeding including melena, hematochezia, hematuria, hemoptysis.  She states she had a EGD and colonoscopy performed in [place] 2 years ago and they were both normal.  She states that she has been taking slow release oral iron tablet daily for the past 1 year without major improvement in her anemia.  She denies any chest pain, shortness of breath, abdominal pain, nausea, vomiting, diarrhea, recent infections, focal weakness.  Review of Systems - Oncology ROS as per HPI otherwise 12 point ROS is negative.  MEDICAL HISTORY: Past Medical History:  Diagnosis Date  . Anemia   . Arthritis   . Bilateral carotid artery disease ([place])   . Diabetes mellitus without complication ([place])   . Difficult intubation    states 'lady that did the sleep study told me I have the smallest airway she has ever seen in an adult"  . Dysrhythmia   . Heart murmur   . Hypertension   . Hypothyroid   . Obesity   . Obstructive sleep apnea    on C Pap  . PAF (paroxysmal atrial fibrillation) ([place])    a. newly dx in 09/2013; on eliquis  . UTI (urinary tract infection)     SURGICAL HISTORY: Past Surgical History:  Procedure Laterality Date  . CATARACT EXTRACTION Left   .  CATARACT EXTRACTION W/PHACO Right 12/09/2015   Procedure: CATARACT EXTRACTION PHACO AND INTRAOCULAR LENS PLACEMENT (I[facility]);  Surgeon: Tonny Branch, MD;  Location: [facility];  Service: Ophthalmology;  Laterality: Right;  CDE:10.48  . COLONOSCOPY W/ POLYPECTOMY    . cyst on back of neck removed    . DILATION AND CURETTAGE OF UTERUS     x 5  . ENDARTERECTOMY Left 08/30/2015   Procedure: LEFT CAROYID ENDARTERECTOMY WITH XENOSURE BOVINE PERICARDIUM PATCH ANGIOPLASTY;  Surgeon: [name], MD;  Location: [facility];  Service: Vascular;  Laterality: Left;  . EYE SURGERY    . TONSILLECTOMY      SOCIAL HISTORY: Social History   Socioeconomic History  . Marital status: Single    Spouse name: Not on file  . Number of children: Not on file  . Years of education: Not on file  . Highest education level: Not on file  Social Needs  . Financial resource strain: Not on file  . Food insecurity - worry: Not on file  . Food insecurity - inability: Not on file  . Transportation needs - medical: Not on file  . Transportation needs - non-medical: Not on file  Occupational History  . Not on file  Tobacco Use  . Smoking status: Never Smoker  . Smokeless tobacco: Never Used  Substance and Sexual Activity  . Alcohol use: No    Alcohol/week: 0.0 oz  . Drug use: No  . Sexual activity: Not on file  Other Topics Concern  . Not  on file  Social History Narrative  . Not on file    FAMILY HISTORY Family History  Problem Relation Age of Onset  . COPD Father   . Heart failure Father   . Heart disease Father   . Arrhythmia Sister   . Arrhythmia Sister   . Arrhythmia Sister        had PPM also  . Cancer Sister     ALLERGIES:  is allergic to quinapril hcl; statins; and tape.  MEDICATIONS:  Current Outpatient Medications  Medication Sig Dispense Refill  . chlorthalidone (HYGROTON) 25 MG tablet TAKE 1 TABLET (25 MG TOTAL) BY MOUTH DAILY.  3  . diltiazem (CARDIZEM) 30 MG tablet Take 30 mg by mouth daily.     Marland Kitchen ELIQUIS 5 MG TABS tablet TAKE 1 TABLET (5 MG TOTAL) BY MOUTH 2 (TWO) TIMES DAILY. 60 tablet 5  . estradiol (ESTRACE) 0.1 MG/GM vaginal cream INSERT 1 APPLICATORFUL VAGINALLY 2 NIGHTS PER WEEK AS DIRECTED  3  . flecainide (TAMBOCOR) 150 MG tablet TAKE 0.5 TABLETS (75 MG TOTAL) BY MOUTH 2 (TWO) TIMES DAILY. 90 tablet 3  . glipiZIDE (GLUCOTROL) 5 MG tablet Take 1 tablet (5 mg total) by mouth 2 (two) times daily. 180 tablet 0  . ibuprofen (ADVIL,MOTRIN) 200 MG tablet Take 600 mg by mouth daily as needed for moderate pain.    Marland Kitchen levothyroxine (SYNTHROID, LEVOTHROID) 112 MCG tablet TAKE 1 TABLET BY MOUTH EVERY OTHER DAY, ALTERNATING WITH 1 & 1/2 TABLETS EVERY OTHER DAY 45 tablet 2  . losartan (COZAAR) 50 MG tablet Take 1 tablet (50 mg total) by mouth daily. 90 tablet 0  . lovastatin (MEVACOR) 20 MG tablet Take 1 tablet (20 mg total) by mouth every evening. 90 tablet 3  . MATZIM LA 180 MG 24 hr tablet TAKE ONE TABLET BY MOUTH ONE TIME DAILY 60 tablet 1  . metFORMIN (GLUCOPHAGE) 500 MG tablet Take 500 mg by mouth 2 (two) times daily.    . metoprolol succinate (TOPROL-XL) 100 MG 24 hr tablet Take 1 tablet (100 mg total) by mouth daily. 90 tablet 1  . nystatin cream (MYCOSTATIN) Apply 1 application topically 2 (two) times daily. 30 g 1  . SLOW IRON 160 (50 Fe) MG TBCR SR tablet Take 1 tablet (160 mg total) by mouth daily. 30 each 5  . traMADol (ULTRAM) 50 MG tablet Take 1 tablet (50 mg total) by mouth every 6 (six) hours as needed for moderate pain or severe pain. 30 tablet 0  . furosemide (LASIX) 20 MG tablet Take 20 mg by mouth daily as needed for fluid.      No current facility-administered medications for this visit.     PHYSICAL EXAMINATION:   Vitals:   05/18/17 0844  BP: (!) 149/50  Pulse: 75  Resp: 18  Temp: 97.8 F (36.6 C)  SpO2: 100%    Filed Weights   05/18/17 0844  Weight: 248 lb 8 oz (112.7 kg)     Physical Exam Constitutional: Well-developed, well-nourished, and in no  distress.   HENT:  Head: Normocephalic and atraumatic.  Mouth/Throat: No oropharyngeal exudate. Mucosa moist. Eyes: Pupils are equal, round, and reactive to light. Conjunctivae are normal. No scleral icterus.  Neck: Normal range of motion. Neck supple. No JVD present.  Cardiovascular: Normal rate, regular rhythm and normal heart sounds.  Exam reveals no gallop and no friction rub.   No murmur heard. Pulmonary/Chest: Effort normal and breath sounds normal. No respiratory distress. No wheezes.No rales.  Abdominal: Soft. Bowel sounds are normal. No distension. There is no tenderness. There is no guarding.  Musculoskeletal: No edema or tenderness.  Lymphadenopathy:    No cervical or supraclavicular adenopathy.  Neurological: Alert and oriented to person, place, and time. No cranial nerve deficit.  Skin: Skin is warm and dry. No rash noted. No erythema. No pallor.  Psychiatric: Affect and judgment normal.     LABORATORY DATA: I have personally reviewed the data as listed:  Appointment on 05/18/2017  Component Date Value Ref Range Status  . WBC 05/18/2017 9.8  4.0 - 10.5 K/uL Final  . RBC 05/18/2017 2.91* 3.87 - 5.11 MIL/uL Final  . Hemoglobin 05/18/2017 8.1* 12.0 - 15.0 g/dL Final  . HCT 05/18/2017 26.8* 36.0 - 46.0 % Final  . MCV 05/18/2017 92.1  78.0 - 100.0 fL Final  . MCH 05/18/2017 27.8  26.0 - 34.0 pg Final  . MCHC 05/18/2017 30.2  30.0 - 36.0 g/dL Final  . RDW 05/18/2017 16.8* 11.5 - 15.5 % Final  . Platelets 05/18/2017 250  150 - 400 K/uL Final  . Neutrophils Relative % 05/18/2017 81  % Final  . Neutro Abs 05/18/2017 8.0* 1.7 - 7.7 K/uL Final  . Lymphocytes Relative 05/18/2017 11  % Final  . Lymphs Abs 05/18/2017 1.1  0.7 - 4.0 K/uL Final  . Monocytes Relative 05/18/2017 6  % Final  . Monocytes Absolute 05/18/2017 0.6  0.1 - 1.0 K/uL Final  . Eosinophils Relative 05/18/2017 2  % Final  . Eosinophils Absolute 05/18/2017 0.2  0.0 - 0.7 K/uL Final  . Basophils Relative  05/18/2017 0  % Final  . Basophils Absolute 05/18/2017 0.0  0.0 - 0.1 K/uL Final  . Sodium 05/18/2017 131* 135 - 145 mmol/L Final  . Potassium 05/18/2017 4.2  3.5 - 5.1 mmol/L Final  . Chloride 05/18/2017 95* 101 - 111 mmol/L Final  . CO2 05/18/2017 24  22 - 32 mmol/L Final  . Glucose, Bld 05/18/2017 336* 65 - 99 mg/dL Final  . BUN 05/18/2017 29* 6 - 20 mg/dL Final  . Creatinine, Ser 05/18/2017 0.67  0.44 - 1.00 mg/dL Final  . Calcium 05/18/2017 8.8* 8.9 - 10.3 mg/dL Final  . Total Protein 05/18/2017 7.1  6.5 - 8.1 g/dL Final  . Albumin 05/18/2017 3.8  3.5 - 5.0 g/dL Final  . AST 05/18/2017 36  15 - 41 U/L Final  . ALT 05/18/2017 22  14 - 54 U/L Final  . Alkaline Phosphatase 05/18/2017 85  38 - 126 U/L Final  . Total Bilirubin 05/18/2017 0.3  0.3 - 1.2 mg/dL Final  . GFR calc non Af Amer 05/18/2017 >60  >60 mL/min Final  . GFR calc Af Amer 05/18/2017 >60  >60 mL/min Final   Comment: (NOTE) The eGFR has been calculated using the CKD EPI equation. This calculation has not been validated in all clinical situations. eGFR's persistently <60 mL/min signify possible Chronic Kidney Disease.   . Anion gap 05/18/2017 12  5 - 15 Final  . Retic Ct Pct 05/18/2017 8.1* 0.4 - 3.1 % Final  . RBC. 05/18/2017 2.91* 3.87 - 5.11 MIL/uL Final  . Retic Count, Absolute 05/18/2017 235.7* 19.0 - 186.0 K/uL Final  . LDH 05/18/2017 126  98 - 192 U/L Final  Office Visit on 05/04/2017  Component Date Value Ref Range Status  . WBC 05/04/2017 9.0  3.4 - 10.8 x10E3/uL Final  . RBC 05/04/2017 3.31* 3.77 - 5.28 x10E6/uL Final  . Hemoglobin 05/04/2017 9.5* 11.1 -  15.9 g/dL Final  . Hematocrit 05/04/2017 30.5* 34.0 - 46.6 % Final  . MCV 05/04/2017 92  79 - 97 fL Final  . MCH 05/04/2017 28.7  26.6 - 33.0 pg Final  . MCHC 05/04/2017 31.1* 31.5 - 35.7 g/dL Final  . RDW 05/04/2017 16.0* 12.3 - 15.4 % Final  . Platelets 05/04/2017 280  150 - 379 x10E3/uL Final  . Neutrophils 05/04/2017 76  Not Estab. % Final  .  Lymphs 05/04/2017 11  Not Estab. % Final  . Monocytes 05/04/2017 8  Not Estab. % Final  . Eos 05/04/2017 3  Not Estab. % Final  . Basos 05/04/2017 1  Not Estab. % Final  . Neutrophils Absolute 05/04/2017 7.0  1.4 - 7.0 x10E3/uL Final  . Lymphocytes Absolute 05/04/2017 1.0  0.7 - 3.1 x10E3/uL Final  . Monocytes Absolute 05/04/2017 0.7  0.1 - 0.9 x10E3/uL Final  . EOS (ABSOLUTE) 05/04/2017 0.2  0.0 - 0.4 x10E3/uL Final  . Basophils Absolute 05/04/2017 0.1  0.0 - 0.2 x10E3/uL Final  . Immature Granulocytes 05/04/2017 1  Not Estab. % Final  . Immature Grans (Abs) 05/04/2017 0.1  0.0 - 0.1 x10E3/uL Final  . Ferritin 05/04/2017 23  15 - 150 ng/mL Final  . Iron 05/04/2017 78  27 - 139 ug/dL Final  Hospital Outpatient Visit on 04/27/2017  Component Date Value Ref Range Status  . Specimen Description 04/27/2017 URINE, RANDOM   Final  . Special Requests 04/27/2017 NONE   Final  . Culture 04/27/2017 *  Final                   Value:>=100,000 COLONIES/mL KLEBSIELLA PNEUMONIAE Confirmed Extended Spectrum Beta-Lactamase Producer (ESBL).  In bloodstream infections from ESBL organisms, carbapenems are preferred over piperacillin/tazobactam. They are shown to have a lower risk of mortality. Performed at [facility], [place] [address] [place], [place] [address]   . Report Status 04/27/2017 04/30/2017 FINAL   Final  . Organism ID, Bacteria 04/27/2017 KLEBSIELLA PNEUMONIAE*  Final  Lab on 04/20/2017  Component Date Value Ref Range Status  . Specific Gravity, UA 04/20/2017 1.020  1.005 - 1.030 Final  . pH, UA 04/20/2017 6.0  5.0 - 7.5 Final  . Color, UA 04/20/2017 Yellow  Yellow Final  . Appearance Ur 04/20/2017 Clear  Clear Final  . Leukocytes, UA 04/20/2017 Negative  Negative Final  . Protein, UA 04/20/2017 Negative  Negative/Trace Final  . Glucose, UA 04/20/2017 3+* Negative Final  . Ketones, UA 04/20/2017 Negative  Negative Final  . RBC, UA 04/20/2017 Negative  Negative Final  . Bilirubin, UA  04/20/2017 Negative  Negative Final  . Urobilinogen, Ur 04/20/2017 0.2  0.2 - 1.0 mg/dL Final  . Nitrite, UA 04/20/2017 Negative  Negative Final  . Microscopic Examination 04/20/2017 See below:   Final  . Urine Culture, Routine 04/20/2017 Final report   Final  . Organism ID, Bacteria 04/20/2017 Comment   Final   Comment: Mixed urogenital flora Less than 10,000 colonies/mL   . WBC, UA 04/20/2017 0-5  0 - 5 /hpf Final  . RBC, UA 04/20/2017 0-2  0 - 2 /hpf Final  . Epithelial Cells (non renal) 04/20/2017 0-10  0 - 10 /hpf Final  . Renal Epithel, UA 04/20/2017 None seen  None seen /hpf Final  . Mucus, UA 04/20/2017 Present  Not Estab. Final  . Bacteria, UA 04/20/2017 Few  None seen/Few Final    RADIOGRAPHIC STUDIES: I have personally reviewed the radiological images as listed  and agree with the findings in the report  No results found.  ASSESSMENT/PLAN Chronic normocytic anemia  -Plan to perform a comprehensive anemia workup with labs as stated below.  -RTC in 2 weeks for follow up to review her lab results and to discuss the next plan of care.   Orders Placed This Encounter  Procedures  . CBC with Differential    Standing Status:   Future    Number of Occurrences:   1    Standing Expiration Date:   05/18/2018  . Comprehensive metabolic panel    Standing Status:   Future    Number of Occurrences:   1    Standing Expiration Date:   05/18/2018  . Iron and TIBC    Standing Status:   Future    Number of Occurrences:   1    Standing Expiration Date:   05/18/2018  . Ferritin    Standing Status:   Future    Number of Occurrences:   1    Standing Expiration Date:   05/18/2018  . Vitamin B12    Standing Status:   Future    Number of Occurrences:   1    Standing Expiration Date:   05/18/2018  . Erythropoietin    Standing Status:   Future    Number of Occurrences:   1    Standing Expiration Date:   05/18/2018  . Folate    Standing Status:   Future    Number of Occurrences:    1    Standing Expiration Date:   05/18/2018  . Reticulocytes    Standing Status:   Future    Number of Occurrences:   1    Standing Expiration Date:   05/18/2018  . Protein electrophoresis, serum    Standing Status:   Future    Number of Occurrences:   1    Standing Expiration Date:   05/18/2018  . Immunofixation electrophoresis    Standing Status:   Future    Number of Occurrences:   1    Standing Expiration Date:   05/18/2018  . Lactate dehydrogenase    Standing Status:   Future    Number of Occurrences:   1    Standing Expiration Date:   05/18/2018  . Direct antiglobulin test    Standing Status:   Future    Standing Expiration Date:   05/18/2018    All questions were answered. The patient knows to call the clinic with any problems, questions or concerns.  This note was electronically signed.    Twana First, MD  05/18/2017 10:27 AM

## 2017-05-19 LAB — ERYTHROPOIETIN: Erythropoietin: 180.2 m[IU]/mL — ABNORMAL HIGH (ref 2.6–18.5)

## 2017-05-19 LAB — IMMUNOFIXATION ELECTROPHORESIS
IGA: 220 mg/dL (ref 87–352)
IGG (IMMUNOGLOBIN G), SERUM: 1107 mg/dL (ref 700–1600)
IGM (IMMUNOGLOBULIN M), SRM: 66 mg/dL (ref 26–217)
Total Protein ELP: 6.6 g/dL (ref 6.0–8.5)

## 2017-05-20 ENCOUNTER — Telehealth (HOSPITAL_COMMUNITY): Payer: Self-pay | Admitting: Emergency Medicine

## 2017-05-20 LAB — PROTEIN ELECTROPHORESIS, SERUM
A/G RATIO SPE: 1.1 (ref 0.7–1.7)
ALBUMIN ELP: 3.4 g/dL (ref 2.9–4.4)
ALPHA-1-GLOBULIN: 0.2 g/dL (ref 0.0–0.4)
ALPHA-2-GLOBULIN: 0.7 g/dL (ref 0.4–1.0)
BETA GLOBULIN: 1.1 g/dL (ref 0.7–1.3)
Gamma Globulin: 1.2 g/dL (ref 0.4–1.8)
Globulin, Total: 3.2 g/dL (ref 2.2–3.9)
Total Protein ELP: 6.6 g/dL (ref 6.0–8.5)

## 2017-05-20 NOTE — Telephone Encounter (Signed)
Called pt with lab results.  Pt will need to 2 doses of feraheme a week a part.  Pt will also need 4 weekly b12 injections then start monthly B12 injections per Dr Talbert Cage.  Pt verbalized understanding.  Scheduling will be calling with appts.

## 2017-05-27 ENCOUNTER — Encounter (HOSPITAL_COMMUNITY): Payer: Self-pay

## 2017-05-27 ENCOUNTER — Encounter (HOSPITAL_BASED_OUTPATIENT_CLINIC_OR_DEPARTMENT_OTHER): Payer: Medicare Other

## 2017-05-27 VITALS — BP 141/54 | HR 66 | Temp 97.8°F | Resp 20 | Wt 249.8 lb

## 2017-05-27 DIAGNOSIS — E538 Deficiency of other specified B group vitamins: Secondary | ICD-10-CM | POA: Diagnosis not present

## 2017-05-27 DIAGNOSIS — D649 Anemia, unspecified: Secondary | ICD-10-CM | POA: Diagnosis not present

## 2017-05-27 DIAGNOSIS — D509 Iron deficiency anemia, unspecified: Secondary | ICD-10-CM

## 2017-05-27 MED ORDER — SODIUM CHLORIDE 0.9% FLUSH
3.0000 mL | Freq: Once | INTRAVENOUS | Status: AC | PRN
  Administered 2017-05-27: 3 mL via INTRAVENOUS

## 2017-05-27 MED ORDER — SODIUM CHLORIDE 0.9 % IV SOLN
Freq: Once | INTRAVENOUS | Status: AC
  Administered 2017-05-27: 14:00:00 via INTRAVENOUS

## 2017-05-27 MED ORDER — CYANOCOBALAMIN 1000 MCG/ML IJ SOLN
1000.0000 ug | Freq: Once | INTRAMUSCULAR | Status: AC
  Administered 2017-05-27: 1000 ug via INTRAMUSCULAR
  Filled 2017-05-27: qty 1

## 2017-05-27 MED ORDER — SODIUM CHLORIDE 0.9 % IV SOLN
510.0000 mg | Freq: Once | INTRAVENOUS | Status: AC
  Administered 2017-05-27: 510 mg via INTRAVENOUS
  Filled 2017-05-27: qty 17

## 2017-05-27 NOTE — Patient Instructions (Signed)

## 2017-05-27 NOTE — Progress Notes (Signed)
For iron infusion today.  Reviewed medication with all questions asked and answered.  Information given for review and to take home.    Patient tolerated IV iron with no complaints voiced.  Peripheral IV site clean and dry with no bruising or swelling noted at site.  Denied pain at site.  Good blood return noted before and after administration of iron.  Band aid applied.  Vitamin b12 shot administered with no complaints and site clean and dry.  Band aid applied.  VSS with discharge and left ambulatory in stable condition.

## 2017-06-02 ENCOUNTER — Ambulatory Visit: Payer: Medicare Other | Admitting: Oncology

## 2017-06-03 ENCOUNTER — Encounter (HOSPITAL_COMMUNITY): Payer: Self-pay

## 2017-06-03 ENCOUNTER — Inpatient Hospital Stay (HOSPITAL_COMMUNITY): Payer: Medicare Other | Attending: Oncology

## 2017-06-03 ENCOUNTER — Other Ambulatory Visit: Payer: Self-pay

## 2017-06-03 VITALS — BP 144/40 | HR 65 | Temp 97.8°F | Resp 20 | Wt 247.6 lb

## 2017-06-03 DIAGNOSIS — R600 Localized edema: Secondary | ICD-10-CM | POA: Insufficient documentation

## 2017-06-03 DIAGNOSIS — I10 Essential (primary) hypertension: Secondary | ICD-10-CM | POA: Insufficient documentation

## 2017-06-03 DIAGNOSIS — D649 Anemia, unspecified: Secondary | ICD-10-CM

## 2017-06-03 DIAGNOSIS — E119 Type 2 diabetes mellitus without complications: Secondary | ICD-10-CM | POA: Insufficient documentation

## 2017-06-03 DIAGNOSIS — E538 Deficiency of other specified B group vitamins: Secondary | ICD-10-CM

## 2017-06-03 DIAGNOSIS — G4733 Obstructive sleep apnea (adult) (pediatric): Secondary | ICD-10-CM | POA: Insufficient documentation

## 2017-06-03 DIAGNOSIS — E039 Hypothyroidism, unspecified: Secondary | ICD-10-CM | POA: Insufficient documentation

## 2017-06-03 DIAGNOSIS — D509 Iron deficiency anemia, unspecified: Secondary | ICD-10-CM | POA: Insufficient documentation

## 2017-06-03 MED ORDER — CYANOCOBALAMIN 1000 MCG/ML IJ SOLN
1000.0000 ug | Freq: Once | INTRAMUSCULAR | Status: AC
  Administered 2017-06-03: 1000 ug via INTRAMUSCULAR

## 2017-06-03 MED ORDER — SODIUM CHLORIDE 0.9 % IV SOLN
Freq: Once | INTRAVENOUS | Status: AC
  Administered 2017-06-03: 13:00:00 via INTRAVENOUS

## 2017-06-03 MED ORDER — SODIUM CHLORIDE 0.9% FLUSH: 3.0000 mL | Freq: Once | INTRAVENOUS | Status: DC | PRN

## 2017-06-03 MED ORDER — SODIUM CHLORIDE 0.9 % IV SOLN
510.0000 mg | Freq: Once | INTRAVENOUS | Status: AC
  Administered 2017-06-03: 510 mg via INTRAVENOUS
  Filled 2017-06-03: qty 17

## 2017-06-03 NOTE — Progress Notes (Signed)
Emily [name], [place] [id number]   CLINIC:  Medical Oncology/Hematology  PCP:  Emily Euler, MD [place] [id number] [phone]   REASON FOR VISIT:  Follow-up for Anemia  CURRENT THERAPY: Initial work-up    HISTORY OF PRESENT ILLNESS:  (From Dr. Laverle Patter note on 05/18/17)  Emily [name] 67 y.o. female presents today for evaluation chronic normocytic anemia.  Patient had blood work done on 05/04/2017 which demonstrated WBC 9K, hemoglobin 9.5 g/dL, hematocrit 30.5%, MCV 92, platelet count 280 K.  Hemoglobin on 02/16/2017 was 11.8 g/dL.  Hemoglobin from 1 year ago on 12/04/15 was 9.5 g/dL.  Patient states that she feels chronically fatigued.  She denies any evidence of bleeding including melena, hematochezia, hematuria, hemoptysis.  She states she had a EGD and colonoscopy performed in [place] 2 years ago and they were both normal.  She states that she has been taking slow release oral iron tablet daily for the past 1 year without major improvement in her anemia.  She denies any chest pain, shortness of breath, abdominal pain, nausea, vomiting, diarrhea, recent infections, focal weakness.    INTERVAL HISTORY:  Emily [name] 67 y.o. female returns for routine follow-up for anemia.   Overall, she tells me she has been feeling "okay." Appetite and energy levels 75%. She has fatigue. Has been struggling with leg edema; recently had doppler ultrasounds that were ordered by vascular/cardiology specialists.    Since her last visit, she had several labs drawn and would like to review those results together today. She had 2 doses of IV Feraheme and vitamin B12 injection. She thinks they may have helped her energy levels some.  She has decided to get her subsequent vitamin B12 injections at her PCP's office, which is certainly reasonable.    She tells me that historically she has had issues with iron deficiency. In the past, she would  have upper back/scapular pain and shortness of breath when her iron levels were low. She has had no recurrent symptoms since she received IV iron infusions.  Denies any bleeding episodes including blood in her stools, dark/tarry stools, hematuria, vaginal bleeding, nosebleeds, or gingival bleeding. Denies any pica or pagophagia.       REVIEW OF SYSTEMS:  Review of Systems  Constitutional: Positive for fatigue.  HENT:  Negative.   Eyes: Negative.   Respiratory: Negative.  Negative for shortness of breath.   Cardiovascular: Positive for leg swelling. Negative for chest pain.  Gastrointestinal: Negative.   Endocrine: Negative.   Genitourinary: Negative.    Musculoskeletal: Negative.   Skin: Negative.   Neurological: Negative.   Hematological: Negative.   Psychiatric/Behavioral: Negative.      PAST MEDICAL/SURGICAL HISTORY:  Past Medical History:  Diagnosis Date  . Anemia   . Arthritis   . Bilateral carotid artery disease ([place])   . Diabetes mellitus without complication ([place])   . Difficult intubation    states 'lady that did the sleep study told me I have the smallest airway she has ever seen in an adult"  . Dysrhythmia   . Heart murmur   . Hypertension   . Hypothyroid   . Obesity   . Obstructive sleep apnea    on C Pap  . PAF (paroxysmal atrial fibrillation) ([place])    a. newly dx in 09/2013; on eliquis  . UTI (urinary tract infection)    Past Surgical History:  Procedure Laterality Date  . CATARACT EXTRACTION Left   .  CATARACT EXTRACTION W/PHACO Right 12/09/2015   Procedure: CATARACT EXTRACTION PHACO AND INTRAOCULAR LENS PLACEMENT (IOC);  Surgeon: Tonny Branch, MD;  Location: [facility];  Service: Ophthalmology;  Laterality: Right;  CDE:10.48  . COLONOSCOPY W/ POLYPECTOMY    . cyst on back of neck removed    . DILATION AND CURETTAGE OF UTERUS     x 5  . ENDARTERECTOMY Left 08/30/2015   Procedure: LEFT CAROYID ENDARTERECTOMY WITH XENOSURE BOVINE PERICARDIUM PATCH ANGIOPLASTY;   Surgeon: [name], MD;  Location: [facility];  Service: Vascular;  Laterality: Left;  . EYE SURGERY    . TONSILLECTOMY       SOCIAL HISTORY:  Social History   Socioeconomic History  . Marital status: Single    Spouse name: Not on file  . Number of children: Not on file  . Years of education: Not on file  . Highest education level: Not on file  Social Needs  . Financial resource strain: Not on file  . Food insecurity - worry: Not on file  . Food insecurity - inability: Not on file  . Transportation needs - medical: Not on file  . Transportation needs - non-medical: Not on file  Occupational History  . Not on file  Tobacco Use  . Smoking status: Never Smoker  . Smokeless tobacco: Never Used  Substance and Sexual Activity  . Alcohol use: No    Alcohol/week: 0.0 oz  . Drug use: No  . Sexual activity: Not on file  Other Topics Concern  . Not on file  Social History Narrative  . Not on file    FAMILY HISTORY:  Family History  Problem Relation Age of Onset  . COPD Father   . Heart failure Father   . Heart disease Father   . Arrhythmia Sister   . Arrhythmia Sister   . Arrhythmia Sister        had PPM also  . Cancer Sister     CURRENT MEDICATIONS:  Outpatient Encounter Medications as of 06/08/2017  Medication Sig Note  . chlorthalidone (HYGROTON) 25 MG tablet TAKE 1 TABLET (25 MG TOTAL) BY MOUTH DAILY.   Marland Kitchen diltiazem (CARDIZEM) 30 MG tablet Take 30 mg by mouth daily.   Marland Kitchen ELIQUIS 5 MG TABS tablet TAKE 1 TABLET (5 MG TOTAL) BY MOUTH 2 (TWO) TIMES DAILY.   Marland Kitchen estradiol (ESTRACE) 0.1 MG/GM vaginal cream INSERT 1 APPLICATORFUL VAGINALLY 2 NIGHTS PER WEEK AS DIRECTED   . flecainide (TAMBOCOR) 150 MG tablet TAKE 0.5 TABLETS (75 MG TOTAL) BY MOUTH 2 (TWO) TIMES DAILY.   Marland Kitchen glipiZIDE (GLUCOTROL) 5 MG tablet Take 1 tablet (5 mg total) by mouth 2 (two) times daily.   Marland Kitchen ibuprofen (ADVIL,MOTRIN) 200 MG tablet Take 600 mg by mouth daily as needed for moderate pain.   Marland Kitchen levothyroxine  (SYNTHROID, LEVOTHROID) 112 MCG tablet TAKE 1 TABLET BY MOUTH EVERY OTHER DAY, ALTERNATING WITH 1 & 1/2 TABLETS EVERY OTHER DAY   . losartan (COZAAR) 50 MG tablet Take 1 tablet (50 mg total) by mouth daily.   Marland Kitchen lovastatin (MEVACOR) 20 MG tablet Take 1 tablet (20 mg total) by mouth every evening.   Marland Kitchen MATZIM LA 180 MG 24 hr tablet TAKE ONE TABLET BY MOUTH ONE TIME DAILY   . metFORMIN (GLUCOPHAGE) 500 MG tablet Take 500 mg by mouth 2 (two) times daily. 10/27/2013: .  . metoprolol succinate (TOPROL-XL) 100 MG 24 hr tablet Take 1 tablet (100 mg total) by mouth daily.   Marland Kitchen nystatin  cream (MYCOSTATIN) Apply 1 application topically 2 (two) times daily.   Marland Kitchen SLOW IRON 160 (50 Fe) MG TBCR SR tablet Take 1 tablet (160 mg total) by mouth daily.   . traMADol (ULTRAM) 50 MG tablet Take 1 tablet (50 mg total) by mouth every 6 (six) hours as needed for moderate pain or severe pain.   . furosemide (LASIX) 20 MG tablet Take 20 mg by mouth daily as needed for fluid.     Facility-Administered Encounter Medications as of 06/08/2017  Medication  . [DISCONTINUED] sodium chloride flush (NS) 0.9 % injection 3 mL    ALLERGIES:  Allergies  Allergen Reactions  . Quinapril Hcl Cough  . Statins Other (See Comments)    Not all Statins but some cause cough and pain in legs.  . Tape Other (See Comments)    Redness, please use "paper" tape     PHYSICAL EXAM:  ECOG Performance status: 1 - Symptomatic; remains independent   Vitals:   06/08/17 1447  BP: (!) 163/49  Pulse: (!) 59  Resp: 20  Temp: 98.3 F (36.8 C)  SpO2: 99%   Filed Weights   06/08/17 1447  Weight: 249 lb 12.8 oz (113.3 kg)    Physical Exam  Constitutional: She is oriented to person, place, and time and well-developed, well-nourished, and in no distress.  HENT:  Head: Normocephalic.  Mouth/Throat: Oropharynx is clear and moist. No oropharyngeal exudate.  Eyes: Conjunctivae are normal. Pupils are equal, round, and reactive to light. No scleral  icterus.  Neck: Normal range of motion. Neck supple.  Cardiovascular: Normal rate and regular rhythm.  Murmur heard. Pulmonary/Chest: Effort normal and breath sounds normal. No respiratory distress.  Abdominal: Soft. Bowel sounds are normal. There is no tenderness.  Musculoskeletal: Normal range of motion. She exhibits no edema.  Lymphadenopathy:    She has no cervical adenopathy.       Right: No supraclavicular adenopathy present.       Left: No supraclavicular adenopathy present.  Neurological: She is alert and oriented to person, place, and time. No cranial nerve deficit. Gait normal.  Skin: Skin is warm and dry. No rash noted.  Psychiatric: Mood, memory, affect and judgment normal.  Nursing note and vitals reviewed.    LABORATORY DATA:  I have reviewed the labs as listed.  CBC    Component Value Date/Time   WBC 9.8 05/18/2017 0901   RBC 2.91 (L) 05/18/2017 0901   RBC 2.91 (L) 05/18/2017 0901   HGB 8.1 (L) 05/18/2017 0901   HGB 9.5 (L) 05/04/2017 1202   HCT 26.8 (L) 05/18/2017 0901   HCT 30.5 (L) 05/04/2017 1202   PLT 250 05/18/2017 0901   PLT 280 05/04/2017 1202   MCV 92.1 05/18/2017 0901   MCV 92 05/04/2017 1202   MCH 27.8 05/18/2017 0901   MCHC 30.2 05/18/2017 0901   RDW 16.8 (H) 05/18/2017 0901   RDW 16.0 (H) 05/04/2017 1202   LYMPHSABS 1.1 05/18/2017 0901   LYMPHSABS 1.0 05/04/2017 1202   MONOABS 0.6 05/18/2017 0901   EOSABS 0.2 05/18/2017 0901   EOSABS 0.2 05/04/2017 1202   BASOSABS 0.0 05/18/2017 0901   BASOSABS 0.1 05/04/2017 1202   CMP Latest Ref Rng & Units 05/18/2017 02/16/2017 11/03/2016  Glucose 65 - 99 mg/dL 336(H) 262(H) 139(H)  BUN 6 - 20 mg/dL 29(H) 15 25  Creatinine 0.44 - 1.00 mg/dL 0.67 0.58 0.59  Sodium 135 - 145 mmol/L 131(L) 136 138  Potassium 3.5 - 5.1 mmol/L 4.2 4.1  3.9  Chloride 101 - 111 mmol/L 95(L) 95(L) 99  CO2 22 - 32 mmol/L _0 Calcium 8.9 - 10.3 mg/dL 8.8(L) 9.5 9.0  Total Protein 6.5 - 8.1 g/dL 7.1 7.3 6.9  Total  Bilirubin 0.3 - 1.2 mg/dL 0.3 0.5 0.6  Alkaline Phos 38 - 126 U/L 85 109 92  AST 15 - 41 U/L 36 28 23  ALT 14 - 54 U/L _1 Results for Emily [name], Emily [name] (MRN [id number])   Ref. Range 05/18/2017 09:01  Iron Latest Ref Range: 28 - 170 ug/dL 273 (H)  UIBC Latest Units: ug/dL 197  TIBC Latest Ref Range: 250 - 450 ug/dL 470 (H)  Saturation Ratios Latest Ref Range: 10.4 - 31.8 % 58 (H)  Ferritin Latest Ref Range: 11 - 307 ng/mL 9 (L)  Folate Latest Ref Range: >5.9 ng/mL 30.0  Results for Emily [name], Emily [name] (MRN [id number])  Ref. Range 05/18/2017 09:01  Vitamin B12 Latest Ref Range: 180 - 914 pg/mL 239  Results for Emily [name], Emily [name] (MRN [id number])   Ref. Range 05/18/2017 09:01  Total Protein ELP Latest Ref Range: 6.0 - 8.5 g/dL 6.6  Albumin ELP Latest Ref Range: 2.9 - 4.4 g/dL 3.4  Globulin, Total Latest Ref Range: 2.2 - 3.9 g/dL 3.2  A/G Ratio Latest Ref Range: 0.7 - 1.7  1.1  Alpha-1-Globulin Latest Ref Range: 0.0 - 0.4 g/dL 0.2  Alpha-2-Globulin Latest Ref Range: 0.4 - 1.0 g/dL 0.7  Beta Globulin Latest Ref Range: 0.7 - 1.3 g/dL 1.1  Gamma Globulin Latest Ref Range: 0.4 - 1.8 g/dL 1.2  M-SPIKE, % Latest Ref Range: Not Observed g/dL Not Observed    Results for Emily [name], Emily [name] (MRN [id number])  Ref. Range 05/18/2017 09:01  IgG (Immunoglobin G), Serum Latest Ref Range: 700 - 1,600 mg/dL 1,107  IgA Latest Ref Range: 87 - 352 mg/dL 220  IgM (Immunoglobulin M), Srm Latest Ref Range: 26 - 217 mg/dL 66   Results for Emily [name], Emily [name] (MRN [id number])   Ref. Range 05/18/2017 09:01  RBC. Latest Ref Range: 3.87 - 5.11 MIL/uL 2.91 (L)  Retic Ct Pct Latest Ref Range: 0.4 - 3.1 % 8.1 (H)  Retic Count, Absolute Latest Ref Range: 19.0 - 186.0 K/uL 235.7 (H)  Results for Emily [name], Emily [name] (MRN [id number])   Ref. Range 05/18/2017 09:01  Erythropoietin Latest Ref Range: 2.6 - 18.5 mIU/mL 180.2 (H)    PENDING LABS:    DIAGNOSTIC IMAGING:    PATHOLOGY:       ASSESSMENT & PLAN:    Anemia:   -Reviewed available labs results (collected on 05/18/17) in detail with patient today.  Definite evidence of iron deficiency, with ferritin 9 and TIBC 470. Received 2 doses of IV Feraheme and tolerated well. Her symptoms have improved.  -Folate levels normal. Vitamin B12 on the low end of normal; Dr. Talbert Cage recommended pt start vitamin B12 injections weekly x 4, followed by monthly injections. Started vitamin B12 injections on 05/27/17. *She will continue with her PCP for convenience, which is appropriate.  -SPEP/IFE essentially unremarkable; No M-spike. Results reassuring that underlying multiple myeloma is not the likely etiology of pt's anemia.  -Erythropoietin level elevated at 180.2 and elevated retic count. Creatinine has historically been normal. GFR also historically normal, indicating that CKD not likely cause of pt's anemia.  -Recommend she stop oral iron, as it is not likely providing clinical benefit based on recent lab results.  -She received 2 doses of IV Feraheme on 05/27/17 and 06/03/17 and tolerated  well.  Oncology Flowsheet 05/27/2017 06/03/2017  ferumoxytol (FERAHEME) IV 510 mg 510 mg  -Labs only in 1 month.  -Return to cancer center in 2 months for follow-up with labs.       Dispo:  -Labs only in 1 month. (CBC with diff, anemia panel) -Return to cancer center in 2 months for follow-up with labs. (CBC with diff, BMP, and anemia panel)   All questions were answered to patient's stated satisfaction. Encouraged patient to call with any new concerns or questions before her next visit to the cancer center and we can certain see her sooner, if needed.      Orders placed this encounter:  Orders Placed This Encounter  Procedures  . Basic metabolic panel  . CBC with Differential/Platelet  . Vitamin B12  . Folate  . Iron and TIBC  . Ferritin      Mike Craze, NP [place] [phone]

## 2017-06-03 NOTE — Progress Notes (Signed)
   Emily [name] presents today for injection per MD orders. B12 1,022mcg administered IM in left Upper Arm. Administration without incident. Patient tolerated well.    Treatment given per orders. Patient tolerated it well without problems. Vitals stable and discharged home from clinic ambulatory. Follow up as scheduled.   [name] with triage nurse about patient getting her B12 injections there instead of getting them here. Triage nurse stated all patient had to do was to call and set up her appointments. She has 2 more weekly doses, and then goes to monthly. Patient understood.

## 2017-06-03 NOTE — Patient Instructions (Signed)
[place] at [facility] Discharge Instructions  RECOMMENDATIONS MADE BY THE CONSULTANT AND ANY TEST RESULTS WILL BE SENT TO YOUR REFERRING PHYSICIAN.  Feraheme given today, B12 given. Follow up as scheduled.  Thank you for choosing [place] at [facility] to provide your oncology and hematology care.  To afford each patient quality time with our provider, please arrive at least 15 minutes before your scheduled appointment time.    If you have a lab appointment with the [facility] please come in thru the  Main Entrance and check in at the main information desk  You need to re-schedule your appointment should you arrive 10 or more minutes late.  We strive to give you quality time with our providers, and arriving late affects you and other patients whose appointments are after yours.  Also, if you no show three or more times for appointments you may be dismissed from the clinic at the providers discretion.     Again, thank you for choosing [facility].  Our hope is that these requests will decrease the amount of time that you wait before being seen by our physicians.       _____________________________________________________________  Should you have questions after your visit to [facility], please contact our office at [phone] between the hours of 8:30 a.m. and 4:30 p.m.  Voicemails left after 4:30 p.m. will not be returned until the following business day.  For prescription refill requests, have your pharmacy contact our office.       Resources For Cancer Patients and their Caregivers ? American Cancer Society: Can assist with transportation, wigs, general needs, runs Look Good Feel Better.        769-654-9345 ? Cancer Care: Provides financial assistance, online support groups, medication/co-pay assistance.  1-800-813-HOPE 252-359-6025) ? [place] Assists [place] cancer  patients and their families through emotional , educational and financial support.  3395053295 ? [place] DSS Where to apply for food stamps, Medicaid and utility assistance. 843-509-7030 ? [employer]: Transportation to medical appointments. (626)159-5572 ? Social Security Administration: May apply for disability if have a Stage IV cancer. 978 831 5316 (510)844-6375 ? [employer], Disability and Transit Services: Assists with nutrition, care and transit needs. [place] Support Programs: @10RELATIVEDAYS @ > Cancer Support Group  2nd Tuesday of the month 1pm-2pm, Journey Room  > Creative Journey  3rd Tuesday of the month 1130am-1pm, Journey Room  > Look Good Feel Better  1st Wednesday of the month 10am-12 noon, Journey Room (Call [place] to register [phone])

## 2017-06-07 ENCOUNTER — Ambulatory Visit (HOSPITAL_COMMUNITY): Payer: Medicare Other | Admitting: Hematology and Oncology

## 2017-06-08 ENCOUNTER — Ambulatory Visit (HOSPITAL_COMMUNITY): Payer: Medicare Other | Admitting: Adult Health

## 2017-06-08 ENCOUNTER — Inpatient Hospital Stay (HOSPITAL_BASED_OUTPATIENT_CLINIC_OR_DEPARTMENT_OTHER): Payer: Medicare Other | Admitting: Adult Health

## 2017-06-08 ENCOUNTER — Ambulatory Visit (HOSPITAL_COMMUNITY)
Admission: RE | Admit: 2017-06-08 | Discharge: 2017-06-08 | Disposition: A | Discharge status: Home / Self Care | Payer: Medicare Other | Source: Ambulatory Visit | Attending: Cardiovascular Disease | Admitting: Cardiovascular Disease

## 2017-06-08 ENCOUNTER — Other Ambulatory Visit: Payer: Self-pay

## 2017-06-08 ENCOUNTER — Encounter (HOSPITAL_COMMUNITY): Payer: Self-pay | Admitting: Adult Health

## 2017-06-08 ENCOUNTER — Ambulatory Visit (HOSPITAL_COMMUNITY): Payer: Medicare Other | Admitting: Hematology and Oncology

## 2017-06-08 VITALS — BP 163/49 | HR 59 | Temp 98.3°F | Resp 20 | Ht 62.5 in | Wt 249.8 lb

## 2017-06-08 DIAGNOSIS — D509 Iron deficiency anemia, unspecified: Secondary | ICD-10-CM | POA: Diagnosis not present

## 2017-06-08 DIAGNOSIS — I70203 Unspecified atherosclerosis of native arteries of extremities, bilateral legs: Secondary | ICD-10-CM | POA: Diagnosis not present

## 2017-06-08 DIAGNOSIS — E119 Type 2 diabetes mellitus without complications: Secondary | ICD-10-CM | POA: Diagnosis not present

## 2017-06-08 DIAGNOSIS — I739 Peripheral vascular disease, unspecified: Secondary | ICD-10-CM

## 2017-06-08 DIAGNOSIS — E039 Hypothyroidism, unspecified: Secondary | ICD-10-CM

## 2017-06-08 DIAGNOSIS — I10 Essential (primary) hypertension: Secondary | ICD-10-CM | POA: Diagnosis not present

## 2017-06-08 DIAGNOSIS — R600 Localized edema: Secondary | ICD-10-CM

## 2017-06-08 DIAGNOSIS — G4733 Obstructive sleep apnea (adult) (pediatric): Secondary | ICD-10-CM | POA: Diagnosis not present

## 2017-06-08 DIAGNOSIS — E538 Deficiency of other specified B group vitamins: Secondary | ICD-10-CM

## 2017-06-08 NOTE — Patient Instructions (Signed)
[place] at [facility] Discharge Instructions  RECOMMENDATIONS MADE BY THE CONSULTANT AND ANY TEST RESULTS WILL BE SENT TO YOUR REFERRING PHYSICIAN. You were seen today by Mike Craze NP. Return in 1 month for labs, Return in 2 months for labs and follow up.   Thank you for choosing [place] at [facility] to provide your oncology and hematology care.  To afford each patient quality time with our provider, please arrive at least 15 minutes before your scheduled appointment time.    If you have a lab appointment with the [place] please come in thru the  Main Entrance and check in at the main information desk  You need to re-schedule your appointment should you arrive 10 or more minutes late.  We strive to give you quality time with our providers, and arriving late affects you and other patients whose appointments are after yours.  Also, if you no show three or more times for appointments you may be dismissed from the clinic at the providers discretion.     Again, thank you for choosing [facility].  Our hope is that these requests will decrease the amount of time that you wait before being seen by our physicians.       _____________________________________________________________  Should you have questions after your visit to [facility], please contact our office at [phone] between the hours of 8:30 a.m. and 4:30 p.m.  Voicemails left after 4:30 p.m. will not be returned until the following business day.  For prescription refill requests, have your pharmacy contact our office.       Resources For Cancer Patients and their Caregivers ? American Cancer Society: Can assist with transportation, wigs, general needs, runs Look Good Feel Better.        4123753843 ? Cancer Care: Provides financial assistance, online support groups, medication/co-pay assistance.  1-800-813-HOPE (360)730-6459) ? [place] Assists [place] cancer patients and their families through emotional , educational and financial support.  979-764-7513 ? [place] DSS Where to apply for food stamps, Medicaid and utility assistance. 906-861-9135 ? [employer]: Transportation to medical appointments. 870-630-1122 ? Social Security Administration: May apply for disability if have a Stage IV cancer. 608 832 0317 9173233371 ? [employer], Disability and Transit Services: Assists with nutrition, care and transit needs. [place] Support Programs: @10RELATIVEDAYS @ > Cancer Support Group  2nd Tuesday of the month 1pm-2pm, Journey Room  > Creative Journey  3rd Tuesday of the month 1130am-1pm, Journey Room  > Look Good Feel Better  1st Wednesday of the month 10am-12 noon, Journey Room (Call [place] to register [phone])

## 2017-06-10 ENCOUNTER — Ambulatory Visit (INDEPENDENT_AMBULATORY_CARE_PROVIDER_SITE_OTHER): Payer: Medicare Other | Admitting: *Deleted

## 2017-06-10 ENCOUNTER — Ambulatory Visit (HOSPITAL_COMMUNITY): Payer: Medicare Other

## 2017-06-10 DIAGNOSIS — E538 Deficiency of other specified B group vitamins: Secondary | ICD-10-CM | POA: Diagnosis not present

## 2017-06-10 MED ORDER — CYANOCOBALAMIN 1000 MCG/ML IJ SOLN
1000.0000 ug | INTRAMUSCULAR | Status: AC
  Administered 2017-06-10 – 2017-06-17 (×2): 1000 ug via INTRAMUSCULAR

## 2017-06-10 NOTE — Progress Notes (Signed)
Pt given cyanocobalamin inj Tolerated well 

## 2017-06-13 ENCOUNTER — Encounter (HOSPITAL_COMMUNITY): Payer: Self-pay | Admitting: Adult Health

## 2017-06-15 ENCOUNTER — Telehealth: Payer: Self-pay | Admitting: Cardiology

## 2017-06-15 ENCOUNTER — Ambulatory Visit (HOSPITAL_COMMUNITY): Payer: Medicare Other | Admitting: Hematology and Oncology

## 2017-06-15 NOTE — Telephone Encounter (Signed)
New Message     Patient called said that she had xrays last week and was told she has blockages in her legs, and that someone would call her back to go over results and what she was suppose to do next   Only going to be home for the next 30 minutes

## 2017-06-15 NOTE — Telephone Encounter (Signed)
LEFT MESSAGE THAT DR [facility] HAS NOT REVIEWED DOPPLER RESULTS  WILL CALL WITH RESULT AND RECOMMENDATIONS.

## 2017-06-17 ENCOUNTER — Ambulatory Visit (INDEPENDENT_AMBULATORY_CARE_PROVIDER_SITE_OTHER): Payer: Medicare Other | Admitting: *Deleted

## 2017-06-17 ENCOUNTER — Ambulatory Visit (HOSPITAL_COMMUNITY): Payer: Medicare Other

## 2017-06-17 DIAGNOSIS — E538 Deficiency of other specified B group vitamins: Secondary | ICD-10-CM | POA: Diagnosis not present

## 2017-06-17 NOTE — Progress Notes (Signed)
Pt given Cyanocobalamin inj Tolerated well 

## 2017-06-22 ENCOUNTER — Ambulatory Visit (HOSPITAL_COMMUNITY): Payer: Medicare Other

## 2017-06-22 ENCOUNTER — Other Ambulatory Visit: Payer: Self-pay | Admitting: Family Medicine

## 2017-06-22 NOTE — Telephone Encounter (Signed)
Notes recorded by Fidel Levy, RN on 06/18/2017 at 2:11 PM EST Patient called w/results. Scheduled for PAD eval with Dr. Gwenlyn Found on Feb 5

## 2017-06-27 NOTE — Progress Notes (Signed)
HPI The patient presents for followup of atrial fib and PVD.  The patient has paroxysmal atrial fibrillation and is being treated with flecainide.   At the last visit she was having some paroxysms.  She also had leg pain and I sent her for Dopplers and is found to have lower extremity disease.   She is to see Dr. Gwenlyn Found.  She said that after the last visit she was actually found to be anemic and when she got iron infusion her leg weakness is much improved. She still has a little leg pain. She's not had any new paroxysms since she called EMS late last year. She's not having tachypalpitations, presyncope or syncope. She denies any chest pressure, neck or arm discomfort.  She has had no weight gain or edema.   Allergies  Allergen Reactions  . Quinapril Hcl Cough  . Statins Other (See Comments)    Not all Statins but some cause cough and pain in legs.  . Tape Other (See Comments)    Redness, please use "paper" tape    Current Outpatient Medications  Medication Sig Dispense Refill  . chlorthalidone (HYGROTON) 25 MG tablet TAKE 1 TABLET (25 MG TOTAL) BY MOUTH DAILY.  3  . diltiazem (CARDIZEM) 30 MG tablet Take 30 mg by mouth daily.    Marland Kitchen ELIQUIS 5 MG TABS tablet TAKE 1 TABLET (5 MG TOTAL) BY MOUTH 2 (TWO) TIMES DAILY. 60 tablet 5  . flecainide (TAMBOCOR) 150 MG tablet TAKE 0.5 TABLETS (75 MG TOTAL) BY MOUTH 2 (TWO) TIMES DAILY. 90 tablet 3  . glipiZIDE (GLUCOTROL) 5 MG tablet Take 1 tablet (5 mg total) by mouth 2 (two) times daily. 180 tablet 0  . ibuprofen (ADVIL,MOTRIN) 200 MG tablet Take 600 mg by mouth daily as needed for moderate pain.    Marland Kitchen levothyroxine (SYNTHROID, LEVOTHROID) 112 MCG tablet TAKE 1 TABLET BY MOUTH EVERY OTHER DAY, ALTERNATING WITH 1 & 1/2 TABLETS EVERY OTHER DAY 45 tablet 2  . losartan (COZAAR) 100 MG tablet Take 1 tablet (100 mg total) by mouth daily. 90 tablet 3  . lovastatin (MEVACOR) 40 MG tablet Take 1 tablet (40 mg total) by mouth every evening. 90 tablet 3  .  MATZIM LA 180 MG 24 hr tablet TAKE ONE TABLET BY MOUTH ONE TIME DAILY 60 tablet 1  . metFORMIN (GLUCOPHAGE) 500 MG tablet TAKE 2 TABLETS IN THE MORNING AND ONE TABLET IN THE EVENING 270 tablet 3  . metoprolol succinate (TOPROL-XL) 100 MG 24 hr tablet Take 1 tablet (100 mg total) by mouth daily. 90 tablet 1  . nystatin cream (MYCOSTATIN) Apply 1 application topically 2 (two) times daily. 30 g 1  . traMADol (ULTRAM) 50 MG tablet Take 1 tablet (50 mg total) by mouth every 6 (six) hours as needed for moderate pain or severe pain. 30 tablet 0  . furosemide (LASIX) 20 MG tablet Take 20 mg by mouth daily as needed for fluid.      No current facility-administered medications for this visit.     Past Medical History:  Diagnosis Date  . Anemia   . Arthritis   . Bilateral carotid artery disease ([place])   . Diabetes mellitus without complication ([place])   . Difficult intubation    states 'lady that did the sleep study told me I have the smallest airway she has ever seen in an adult"  . Dysrhythmia   . Heart murmur   . Hypertension   . Hypothyroid   .  Obesity   . Obstructive sleep apnea    on C Pap  . PAF (paroxysmal atrial fibrillation) ([place])    a. newly dx in 09/2013; on eliquis  . UTI (urinary tract infection)     Past Surgical History:  Procedure Laterality Date  . CATARACT EXTRACTION Left   . CATARACT EXTRACTION W/PHACO Right 12/09/2015   Procedure: CATARACT EXTRACTION PHACO AND INTRAOCULAR LENS PLACEMENT (IOC);  Surgeon: Tonny Branch, MD;  Location: [facility];  Service: Ophthalmology;  Laterality: Right;  CDE:10.48  . COLONOSCOPY W/ POLYPECTOMY    . cyst on back of neck removed    . DILATION AND CURETTAGE OF UTERUS     x 5  . ENDARTERECTOMY Left 08/30/2015   Procedure: LEFT CAROYID ENDARTERECTOMY WITH XENOSURE BOVINE PERICARDIUM PATCH ANGIOPLASTY;  Surgeon: [name], MD;  Location: [facility];  Service: Vascular;  Laterality: Left;  . EYE SURGERY    . TONSILLECTOMY      ROS:    As stated  in the HPI and negative for all other systems.  PHYSICAL EXAM BP (!) 158/68   Pulse (!) 58   Ht 5\' 3"  (1.6 m)   Wt 247 lb (112 kg)   BMI 43.75 kg/m   GENERAL:  Well appearing NECK:  No jugular venous distention, waveform within normal limits, carotid upstroke brisk and symmetric, no bruits, no thyromegaly LUNGS:  Clear to auscultation bilaterally CHEST:  Unremarkable HEART:  PMI not displaced or sustained,S1 and S2 within normal limits, no S3, no S4, no clicks, no rubs, 3/6 early peaking systolic murmur radiating slightly out the aortic outflow tract, no diastolic murmurs ABD:  Flat, positive bowel sounds normal in frequency in pitch, no bruits, no rebound, no guarding, no midline pulsatile mass, no hepatomegaly, no splenomegaly EXT:  2 plus pulses throughout, no edema, no cyanosis no clubbing   EKG:   Sinus rhythm, rate 58, axis leftward , intervals within normal limits , no acute ST-T wave changes, nonspecific inferior T wave changes unchanged from previous.  06/29/2017   Lab Results  Component Value Date   CHOL 202 (H) 02/16/2017   TRIG 207 (H) 02/16/2017   HDL 48 02/16/2017   LDLCALC 113 (H) 02/16/2017   Lab Results  Component Value Date   HGBA1C 8.8 01/07/2016    ASSESSMENT AND PLAN  ATRIAL FIB:   [name] has a CHA2DS2 - VASc score of 4.  With the anemia I would like her to get stool guaiac cards and we will arrange this through her primary provider. She's not had any active bleeding that she notices and she had a workup for this in the past and there was no source of bleeding so at this point I'm going to continue her on anticoagulation. She's not had any further paroxysms of fibrillation so she will remain on the current dose of flecainide which we could increase if there are increased palpitations in the future.  HTN:  Her blood pressure is elevated. I'll increase her Cozaar to 100 mg daily.  CAROTID STENOSIS:  She has 60 - 79% right stenosis and patent CEA  in Sept of last year.  She is followed by [facility].    LEG PAIN:   The patient did have significant lower extremity vascular arterial duplex and has follow up with Dr. Gwenlyn Found next week.   DM: A1c was elevated as above.  She follows with Timmothy Euler, MD  DYSLIPIDEMIA:   She is not at target.  I would  like increase the statin to 40 mg and repeat a lipd and liver in 8 weeks.   MURMUR: Echo 2017 demonstrated no significant valvular disease.  I will follow this clinically for now.

## 2017-06-29 ENCOUNTER — Encounter: Payer: Self-pay | Admitting: Cardiology

## 2017-06-29 ENCOUNTER — Ambulatory Visit (INDEPENDENT_AMBULATORY_CARE_PROVIDER_SITE_OTHER): Payer: Medicare Other | Admitting: Cardiology

## 2017-06-29 VITALS — BP 158/68 | HR 58 | Ht 63.0 in | Wt 247.0 lb

## 2017-06-29 DIAGNOSIS — H43393 Other vitreous opacities, bilateral: Secondary | ICD-10-CM | POA: Diagnosis not present

## 2017-06-29 DIAGNOSIS — I10 Essential (primary) hypertension: Secondary | ICD-10-CM

## 2017-06-29 DIAGNOSIS — E785 Hyperlipidemia, unspecified: Secondary | ICD-10-CM

## 2017-06-29 DIAGNOSIS — Z7984 Long term (current) use of oral hypoglycemic drugs: Secondary | ICD-10-CM | POA: Diagnosis not present

## 2017-06-29 DIAGNOSIS — E1169 Type 2 diabetes mellitus with other specified complication: Secondary | ICD-10-CM | POA: Insufficient documentation

## 2017-06-29 DIAGNOSIS — E119 Type 2 diabetes mellitus without complications: Secondary | ICD-10-CM | POA: Diagnosis not present

## 2017-06-29 DIAGNOSIS — I4891 Unspecified atrial fibrillation: Secondary | ICD-10-CM

## 2017-06-29 DIAGNOSIS — E118 Type 2 diabetes mellitus with unspecified complications: Secondary | ICD-10-CM | POA: Insufficient documentation

## 2017-06-29 DIAGNOSIS — Z961 Presence of intraocular lens: Secondary | ICD-10-CM | POA: Diagnosis not present

## 2017-06-29 HISTORY — DX: Hyperlipidemia, unspecified: E78.5

## 2017-06-29 LAB — HM DIABETES EYE EXAM

## 2017-06-29 MED ORDER — LOVASTATIN 40 MG PO TABS: 40.0000 mg | ORAL_TABLET | Freq: Every evening | ORAL | 3 refills | Status: DC

## 2017-06-29 MED ORDER — LOSARTAN POTASSIUM 100 MG PO TABS: 100.0000 mg | ORAL_TABLET | Freq: Every day | ORAL | 3 refills | Status: DC

## 2017-06-29 NOTE — Patient Instructions (Signed)
Medication Instructions:  INCREASE- Lovastatin 40 mg daily and Losartan 100 mg daily  If you need a refill on your cardiac medications before your next appointment, please call your pharmacy.  Labwork: Fasting Lipid Liver in 8 Weeks. HERE IN OUR OFFICE AT [facility]  Take the provided lab slips for you to take with you to the lab for you blood draw.   You will need to fast. DO NOT EAT OR DRINK PAST MIDNIGHT.   You may go to any [facility] lab that is convenient for you however, we do have a lab in our office that is able to assist you. You do NOT need an appointment for our lab. Once in our office lobby there is a podium to the right of the check-in desk where you are to sign-in and ring a doorbell to alert [place] you are here. Lab is open Monday-Friday from 8:00am to 4:00pm; and is closed for lunch from 12:45p-1:45pm   Testing/Procedures: None Ordered  Follow-Up: Your physician wants you to follow-up in: 6 Months. You should receive a reminder letter in the mail two months in advance. If you do not receive a letter, please call our office [phone].    Thank you for choosing [facility] at [facility]!!

## 2017-07-06 ENCOUNTER — Encounter: Payer: Self-pay | Admitting: Cardiovascular Disease

## 2017-07-06 ENCOUNTER — Ambulatory Visit (INDEPENDENT_AMBULATORY_CARE_PROVIDER_SITE_OTHER): Payer: Medicare Other | Admitting: Cardiovascular Disease

## 2017-07-06 VITALS — BP 142/68 | HR 52 | Ht 62.5 in | Wt 246.0 lb

## 2017-07-06 DIAGNOSIS — I739 Peripheral vascular disease, unspecified: Secondary | ICD-10-CM | POA: Insufficient documentation

## 2017-07-06 DIAGNOSIS — I4891 Unspecified atrial fibrillation: Secondary | ICD-10-CM

## 2017-07-06 NOTE — Assessment & Plan Note (Signed)
History of PAD with recent lower extremity arterial Doppler studies performed 06/08/17 revealing a right ABI of 1 and a left 0.95. She did have a high-frequency signal in her proximal left SFA. She was having "heaviness" in her legs recently when she was found to be anemic and underwent a transfusion which improved her symptoms. She now has claudication. We are going to repeat her Dopplers annually. Should she develop life limiting claudication I would be happy to see her back for evaluation and treatment including angiography and potential endovascular therapy.

## 2017-07-06 NOTE — Patient Instructions (Signed)
Medication Instructions: Your physician recommends that you continue on your current medications as directed. Please refer to the Current Medication list given to you today.   Testing/Procedures: Your physician has requested that you have an echocardiogram. Echocardiography is a painless test that uses sound waves to create images of your heart. It provides your doctor with information about the size and shape of your heart and how well your heart's chambers and valves are working. This procedure takes approximately one hour. There are no restrictions for this procedure.  IN 1 YEAR: Your physician has requested that you have a lower extremity arterial duplex. During this test, ultrasound is used to evaluate arterial blood flow in the legs. Allow one hour for this exam. There are no restrictions or special instructions.  Your physician has requested that you have an ankle brachial index (ABI). During this test an ultrasound and blood pressure cuff are used to evaluate the arteries that supply the arms and legs with blood. Allow thirty minutes for this exam. There are no restrictions or special instructions.  Follow-Up: Your physician recommends that you schedule a follow-up appointment as needed with Dr. Gwenlyn Found.

## 2017-07-06 NOTE — Progress Notes (Signed)
07/06/2017 Emily [name]   [birth year]  [id number]  Primary Physician Timmothy Euler, MD Primary Cardiologist: [name] MD Lupe Carney, [place]  HPI:  Emily [name] is a 67 y.o.  moderately overweight single Caucasian female with no children referred to me by Dr. Percival Spanish  for peripheral vascular evaluation because of a  high-grade left internal carotid artery stenosis. I I last saw her in the office 07/05/15. In addition, she has moderate right ICA stenosis. She has never had a heart attack or stroke. She does have a history of treated diabetes, hypertension and hyperlipidemia as well as proximal atrial fibrillation maintaining sinus rhythm on oral anticoagulation. She has no high-risk features for endarterectomy. I did refer her to Dr. Trula Slade who performed uncomplicated elective left carotid endarterectomy. He follows suspected that ultrasound annually. She was referred back to me because of an abnormal Doppler study performed 06/08/17 revealing fairly normal ABIs with a high-frequency signal in her proximal left SFA. This was done because of "leg heaviness" however this was in the setting of severe anemia which improved with iron transfusion.   Current Meds  Medication Sig  . chlorthalidone (HYGROTON) 25 MG tablet TAKE 1 TABLET (25 MG TOTAL) BY MOUTH DAILY.  Marland Kitchen diltiazem (CARDIZEM) 30 MG tablet Take 30 mg by mouth daily.  Marland Kitchen ELIQUIS 5 MG TABS tablet TAKE 1 TABLET (5 MG TOTAL) BY MOUTH 2 (TWO) TIMES DAILY.  . flecainide (TAMBOCOR) 150 MG tablet TAKE 0.5 TABLETS (75 MG TOTAL) BY MOUTH 2 (TWO) TIMES DAILY.  Marland Kitchen glipiZIDE (GLUCOTROL) 5 MG tablet Take 1 tablet (5 mg total) by mouth 2 (two) times daily.  Marland Kitchen ibuprofen (ADVIL,MOTRIN) 200 MG tablet Take 600 mg by mouth daily as needed for moderate pain.  Marland Kitchen levothyroxine (SYNTHROID, LEVOTHROID) 112 MCG tablet TAKE 1 TABLET BY MOUTH EVERY OTHER DAY, ALTERNATING WITH 1 & 1/2 TABLETS EVERY OTHER DAY  . losartan (COZAAR) 100 MG tablet Take  1 tablet (100 mg total) by mouth daily.  Marland Kitchen lovastatin (MEVACOR) 40 MG tablet Take 1 tablet (40 mg total) by mouth every evening.  Marland Kitchen MATZIM LA 180 MG 24 hr tablet TAKE ONE TABLET BY MOUTH ONE TIME DAILY  . metFORMIN (GLUCOPHAGE) 500 MG tablet TAKE 2 TABLETS IN THE MORNING AND ONE TABLET IN THE EVENING  . metoprolol succinate (TOPROL-XL) 100 MG 24 hr tablet Take 1 tablet (100 mg total) by mouth daily.  Marland Kitchen nystatin cream (MYCOSTATIN) Apply 1 application topically 2 (two) times daily.  . traMADol (ULTRAM) 50 MG tablet Take 1 tablet (50 mg total) by mouth every 6 (six) hours as needed for moderate pain or severe pain.     Allergies  Allergen Reactions  . Quinapril Hcl Cough  . Statins Other (See Comments)    Not all Statins but some cause cough and pain in legs.  . Tape Other (See Comments)    Redness, please use "paper" tape    Social History   Socioeconomic History  . Marital status: Single    Spouse name: Not on file  . Number of children: Not on file  . Years of education: Not on file  . Highest education level: Not on file  Social Needs  . Financial resource strain: Not on file  . Food insecurity - worry: Not on file  . Food insecurity - inability: Not on file  . Transportation needs - medical: Not on file  . Transportation needs - non-medical: Not on file  Occupational History  .  Not on file  Tobacco Use  . Smoking status: Never Smoker  . Smokeless tobacco: Never Used  Substance and Sexual Activity  . Alcohol use: No    Alcohol/week: 0.0 oz  . Drug use: No  . Sexual activity: Not on file  Other Topics Concern  . Not on file  Social History Narrative  . Not on file     Review of Systems: General: negative for chills, fever, night sweats or weight changes.  Cardiovascular: negative for chest pain, dyspnea on exertion, edema, orthopnea, palpitations, paroxysmal nocturnal dyspnea or shortness of breath Dermatological: negative for rash Respiratory: negative for cough  or wheezing Urologic: negative for hematuria Abdominal: negative for nausea, vomiting, diarrhea, bright red blood per rectum, melena, or hematemesis Neurologic: negative for visual changes, syncope, or dizziness All other systems reviewed and are otherwise negative except as noted above.    Blood pressure (!) 142/68, pulse (!) 52, height 5' 2.5" (1.588 m), weight 246 lb (111.6 kg).  General appearance: alert and no distress Neck: no adenopathy, no carotid bruit, no JVD, supple, symmetrical, trachea midline and thyroid not enlarged, symmetric, no tenderness/mass/nodules Lungs: clear to auscultation bilaterally Heart: regular rate and rhythm, S1, S2 normal, no murmur, click, rub or gallop Extremities: extremities normal, atraumatic, no cyanosis or edema Pulses: 2+ and symmetric Skin: Skin color, texture, turgor normal. No rashes or lesions Neurologic: Alert and oriented X 3, normal strength and tone. Normal symmetric reflexes. Normal coordination and gait  EKG not performed today  ASSESSMENT AND PLAN:   Peripheral arterial disease ([place]) History of PAD with recent lower extremity arterial Doppler studies performed 06/08/17 revealing a right ABI of 1 and a left 0.95. She did have a high-frequency signal in her proximal left SFA. She was having "heaviness" in her legs recently when she was found to be anemic and underwent a transfusion which improved her symptoms. She now has claudication. We are going to repeat her Dopplers annually. Should she develop life limiting claudication I would be happy to see her back for evaluation and treatment including angiography and potential endovascular therapy.      [name] MD FACP,FACC,FAHA, [facility] 07/06/2017 12:11 PM

## 2017-07-07 ENCOUNTER — Other Ambulatory Visit: Payer: Medicare Other

## 2017-07-07 DIAGNOSIS — Z1211 Encounter for screening for malignant neoplasm of colon: Secondary | ICD-10-CM

## 2017-07-08 DIAGNOSIS — E1142 Type 2 diabetes mellitus with diabetic polyneuropathy: Secondary | ICD-10-CM | POA: Diagnosis not present

## 2017-07-08 DIAGNOSIS — M79676 Pain in unspecified toe(s): Secondary | ICD-10-CM | POA: Diagnosis not present

## 2017-07-08 DIAGNOSIS — L84 Corns and callosities: Secondary | ICD-10-CM | POA: Diagnosis not present

## 2017-07-08 DIAGNOSIS — B351 Tinea unguium: Secondary | ICD-10-CM | POA: Diagnosis not present

## 2017-07-08 LAB — FECAL OCCULT BLOOD, IMMUNOCHEMICAL: FECAL OCCULT BLD: NEGATIVE

## 2017-07-12 ENCOUNTER — Other Ambulatory Visit (HOSPITAL_COMMUNITY): Payer: Medicare Other

## 2017-07-13 ENCOUNTER — Inpatient Hospital Stay (HOSPITAL_COMMUNITY): Payer: Medicare Other | Attending: Internal Medicine

## 2017-07-13 DIAGNOSIS — D649 Anemia, unspecified: Secondary | ICD-10-CM | POA: Diagnosis not present

## 2017-07-13 DIAGNOSIS — E538 Deficiency of other specified B group vitamins: Secondary | ICD-10-CM

## 2017-07-13 DIAGNOSIS — D509 Iron deficiency anemia, unspecified: Secondary | ICD-10-CM

## 2017-07-13 LAB — IRON AND TIBC
Iron: 66 ug/dL (ref 28–170)
Saturation Ratios: 19 % (ref 10.4–31.8)
TIBC: 346 ug/dL (ref 250–450)
UIBC: 280 ug/dL

## 2017-07-13 LAB — CBC WITH DIFFERENTIAL/PLATELET
Basophils Absolute: 0.1 10*3/uL (ref 0.0–0.1)
Basophils Relative: 1 %
EOS PCT: 3 %
Eosinophils Absolute: 0.3 10*3/uL (ref 0.0–0.7)
HEMATOCRIT: 36.7 % (ref 36.0–46.0)
Hemoglobin: 11.5 g/dL — ABNORMAL LOW (ref 12.0–15.0)
LYMPHS PCT: 12 %
Lymphs Abs: 1.1 10*3/uL (ref 0.7–4.0)
MCH: 28.7 pg (ref 26.0–34.0)
MCHC: 31.3 g/dL (ref 30.0–36.0)
MCV: 91.5 fL (ref 78.0–100.0)
MONO ABS: 0.5 10*3/uL (ref 0.1–1.0)
MONOS PCT: 6 %
NEUTROS ABS: 7.4 10*3/uL (ref 1.7–7.7)
Neutrophils Relative %: 78 %
PLATELETS: 244 10*3/uL (ref 150–400)
RBC: 4.01 MIL/uL (ref 3.87–5.11)
RDW: 16.5 % — AB (ref 11.5–15.5)
WBC: 9.4 10*3/uL (ref 4.0–10.5)

## 2017-07-13 LAB — FERRITIN: FERRITIN: 98 ng/mL (ref 11–307)

## 2017-07-13 LAB — FOLATE: Folate: 33 ng/mL (ref >5.9)

## 2017-07-13 LAB — VITAMIN B12: Vitamin B-12: 380 pg/mL (ref 180–914)

## 2017-07-20 ENCOUNTER — Ambulatory Visit (INDEPENDENT_AMBULATORY_CARE_PROVIDER_SITE_OTHER): Payer: Medicare Other | Admitting: *Deleted

## 2017-07-20 DIAGNOSIS — E538 Deficiency of other specified B group vitamins: Secondary | ICD-10-CM | POA: Diagnosis not present

## 2017-07-20 MED ORDER — CYANOCOBALAMIN 1000 MCG/ML IJ SOLN
1000.0000 ug | INTRAMUSCULAR | Status: AC
  Administered 2017-07-20 – 2024-06-30 (×79): 1000 ug via INTRAMUSCULAR

## 2017-07-20 NOTE — Progress Notes (Signed)
Pt given Cyanocobalamin inj Tolerated well 

## 2017-07-21 ENCOUNTER — Telehealth: Payer: Self-pay | Admitting: Family Medicine

## 2017-07-21 NOTE — Telephone Encounter (Signed)
Patient notified and will repeat FOBT.

## 2017-07-21 NOTE — Telephone Encounter (Signed)
Pt with some conmcern for GIB with darker stool. She states loose stool is normal but not this dark.   Would recommend repeat FOBT test.   Laroy Apple, MD [facility] 07/21/2017, 3:22 PM

## 2017-07-21 NOTE — Telephone Encounter (Signed)
Patient aware of neg test result.  Aware of our office policy - we do no not call If labs are normal.  Patient states that she has been having  "dark diarrhea" today.   States that diarrhea is normal for her sometimes. That is her "clean out day".  Worried about the dark color. Patient wanting to know if she ntbs or should keep an eye on it and let us know later this week? Please advise

## 2017-07-22 ENCOUNTER — Emergency Department (HOSPITAL_COMMUNITY)
Admission: EM | Admit: 2017-07-22 | Discharge: 2017-07-22 | Disposition: A | Discharge status: Home / Self Care | Payer: Medicare Other | Attending: Emergency Medicine | Admitting: Emergency Medicine

## 2017-07-22 ENCOUNTER — Other Ambulatory Visit: Payer: Self-pay | Admitting: Cardiology

## 2017-07-22 ENCOUNTER — Other Ambulatory Visit: Payer: Self-pay

## 2017-07-22 ENCOUNTER — Emergency Department (HOSPITAL_COMMUNITY): Payer: Medicare Other

## 2017-07-22 ENCOUNTER — Encounter (HOSPITAL_COMMUNITY): Payer: Self-pay

## 2017-07-22 DIAGNOSIS — E1151 Type 2 diabetes mellitus with diabetic peripheral angiopathy without gangrene: Secondary | ICD-10-CM | POA: Diagnosis not present

## 2017-07-22 DIAGNOSIS — Z7984 Long term (current) use of oral hypoglycemic drugs: Secondary | ICD-10-CM | POA: Insufficient documentation

## 2017-07-22 DIAGNOSIS — Z79899 Other long term (current) drug therapy: Secondary | ICD-10-CM | POA: Insufficient documentation

## 2017-07-22 DIAGNOSIS — I10 Essential (primary) hypertension: Secondary | ICD-10-CM | POA: Diagnosis not present

## 2017-07-22 DIAGNOSIS — R1011 Right upper quadrant pain: Secondary | ICD-10-CM | POA: Insufficient documentation

## 2017-07-22 DIAGNOSIS — E039 Hypothyroidism, unspecified: Secondary | ICD-10-CM | POA: Diagnosis not present

## 2017-07-22 DIAGNOSIS — K2970 Gastritis, unspecified, without bleeding: Secondary | ICD-10-CM | POA: Diagnosis not present

## 2017-07-22 DIAGNOSIS — R110 Nausea: Secondary | ICD-10-CM | POA: Diagnosis not present

## 2017-07-22 DIAGNOSIS — Z7901 Long term (current) use of anticoagulants: Secondary | ICD-10-CM | POA: Insufficient documentation

## 2017-07-22 DIAGNOSIS — R112 Nausea with vomiting, unspecified: Secondary | ICD-10-CM | POA: Diagnosis not present

## 2017-07-22 DIAGNOSIS — E119 Type 2 diabetes mellitus without complications: Secondary | ICD-10-CM | POA: Insufficient documentation

## 2017-07-22 DIAGNOSIS — N12 Tubulo-interstitial nephritis, not specified as acute or chronic: Secondary | ICD-10-CM | POA: Diagnosis not present

## 2017-07-22 DIAGNOSIS — R531 Weakness: Secondary | ICD-10-CM | POA: Diagnosis not present

## 2017-07-22 LAB — URINALYSIS, ROUTINE W REFLEX MICROSCOPIC
Bilirubin Urine: NEGATIVE
Glucose, UA: >=500 mg/dL — AB
Hgb urine dipstick: NEGATIVE
KETONES UR: 20 mg/dL — AB
Nitrite: POSITIVE — AB
PROTEIN: NEGATIVE mg/dL
Specific Gravity, Urine: 1.016 (ref 1.005–1.030)
pH: 5 (ref 5.0–8.0)

## 2017-07-22 LAB — CBC WITH DIFFERENTIAL/PLATELET
BASOS ABS: 0 10*3/uL (ref 0.0–0.1)
Basophils Relative: 0 %
Eosinophils Absolute: 0.1 10*3/uL (ref 0.0–0.7)
Eosinophils Relative: 0 %
HEMATOCRIT: 31.8 % — AB (ref 36.0–46.0)
HEMOGLOBIN: 10 g/dL — AB (ref 12.0–15.0)
LYMPHS PCT: 7 %
Lymphs Abs: 0.8 10*3/uL (ref 0.7–4.0)
MCH: 28.9 pg (ref 26.0–34.0)
MCHC: 31.4 g/dL (ref 30.0–36.0)
MCV: 91.9 fL (ref 78.0–100.0)
MONO ABS: 0.4 10*3/uL (ref 0.1–1.0)
Monocytes Relative: 4 %
NEUTROS ABS: 10.3 10*3/uL — AB (ref 1.7–7.7)
Neutrophils Relative %: 89 %
Platelets: 228 10*3/uL (ref 150–400)
RBC: 3.46 MIL/uL — AB (ref 3.87–5.11)
RDW: 16.9 % — ABNORMAL HIGH (ref 11.5–15.5)
WBC: 11.6 10*3/uL — AB (ref 4.0–10.5)

## 2017-07-22 LAB — COMPREHENSIVE METABOLIC PANEL
ALK PHOS: 91 U/L (ref 38–126)
ALT: 25 U/L (ref 14–54)
AST: 32 U/L (ref 15–41)
Albumin: 3.9 g/dL (ref 3.5–5.0)
Anion gap: 12 (ref 5–15)
BILIRUBIN TOTAL: 0.7 mg/dL (ref 0.3–1.2)
BUN: 30 mg/dL — AB (ref 6–20)
CO2: 25 mmol/L (ref 22–32)
CREATININE: 0.66 mg/dL (ref 0.44–1.00)
Calcium: 8.9 mg/dL (ref 8.9–10.3)
Chloride: 94 mmol/L — ABNORMAL LOW (ref 101–111)
GFR calc Af Amer: >60 mL/min (ref >60)
Glucose, Bld: 340 mg/dL — ABNORMAL HIGH (ref 65–99)
Potassium: 4 mmol/L (ref 3.5–5.1)
Sodium: 131 mmol/L — ABNORMAL LOW (ref 135–145)
TOTAL PROTEIN: 7.4 g/dL (ref 6.5–8.1)

## 2017-07-22 LAB — LIPASE, BLOOD: LIPASE: 24 U/L (ref 11–51)

## 2017-07-22 MED ORDER — ONDANSETRON 4 MG PO TBDP: 4.0000 mg | ORAL_TABLET | Freq: Three times a day (TID) | ORAL | 0 refills | Status: DC | PRN

## 2017-07-22 MED ORDER — CIPROFLOXACIN HCL 250 MG PO TABS
500.0000 mg | ORAL_TABLET | Freq: Once | ORAL | Status: AC
  Administered 2017-07-22: 500 mg via ORAL
  Filled 2017-07-22: qty 2

## 2017-07-22 MED ORDER — CIPROFLOXACIN HCL 500 MG PO TABS: 500.0000 mg | ORAL_TABLET | Freq: Two times a day (BID) | ORAL | 0 refills | Status: DC

## 2017-07-22 NOTE — ED Notes (Signed)
UA collected.  Ice chips given to patient.  Patient states that she is feeling better.

## 2017-07-22 NOTE — ED Triage Notes (Signed)
Pt reports  started having abd pain shortly after eating some leftovers.  Reports vomited x 1 and became weak all over.    EMS was called, they administered 4mg  zofran iv. cbg 257

## 2017-07-22 NOTE — Telephone Encounter (Signed)
REFILL 

## 2017-07-22 NOTE — ED Notes (Signed)
Lab drawing blood at this time 

## 2017-07-22 NOTE — ED Provider Notes (Addendum)
[facility] EMERGENCY DEPARTMENT Provider Note   CSN: [id number] Arrival date & time: 07/22/17  1452     History   Chief Complaint Chief Complaint  Patient presents with  . Weakness  . Emesis    HPI Emily [name] is a 67 y.o. female.  Chief complaint is upper quadrant pain, nausea, lightheaded  HPI 67 year old female.  In her previous normal state of health.  He works as a Theme park manager.  Had just given her cat was sitting talking to her client.  She had eaten some leftover [place] food that had been refrigerated for the last 2 days.  She developed right upper quadrant pain.  Colicky and intermittent.  "I thought it was gas".  Nausea, vomiting x1.  Heme-negative.  She may have "kind of passed out".  She became very lightheaded.  Her friend called paramedics.  She was given some Zofran.  Her pain resolved prior to their arrival.  She feels better here.  States she just feels "worn out".  No history of known biliary disease or stones.  No fatty food intolerances or upper episodes of colic.  No reflux or heartburn symptoms.  Past Medical History:  Diagnosis Date  . Anemia   . Arthritis   . Bilateral carotid artery disease ([place])   . Diabetes mellitus without complication ([place])   . Difficult intubation    states 'lady that did the sleep study told me I have the smallest airway she has ever seen in an adult"  . Dysrhythmia   . Heart murmur   . Hypertension   . Hypothyroid   . Obesity   . Obstructive sleep apnea    on C Pap  . PAF (paroxysmal atrial fibrillation) ([place])    a. newly dx in 09/2013; on eliquis  . UTI (urinary tract infection)     Patient Active Problem List   Diagnosis Date Noted  . Peripheral arterial disease ([place]) 07/06/2017  . Hyperlipidemia 06/29/2017  . Dyslipidemia 06/29/2017  . Iron deficiency anemia 05/27/2017  . Vitamin B12 deficiency 05/27/2017  . Pain in joint, ankle and foot 06/03/2016  . Carotid stenosis 08/30/2015  . Bilateral carotid artery  disease ([place]) 07/05/2015  . Acute bronchitis 01/31/2015  . Diabetes mellitus without complication ([place])   . Diabetes ([place]) 10/27/2013  . Hypertension 10/27/2013  . Hypothyroidism 10/27/2013  . Atrial fibrillation, rapid ([place]) 10/27/2013    Past Surgical History:  Procedure Laterality Date  . CATARACT EXTRACTION Left   . CATARACT EXTRACTION W/PHACO Right 12/09/2015   Procedure: CATARACT EXTRACTION PHACO AND INTRAOCULAR LENS PLACEMENT (IOC);  Surgeon: Tonny Branch, MD;  Location: [facility];  Service: Ophthalmology;  Laterality: Right;  CDE:10.48  . COLONOSCOPY W/ POLYPECTOMY    . cyst on back of neck removed    . DILATION AND CURETTAGE OF UTERUS     x 5  . ENDARTERECTOMY Left 08/30/2015   Procedure: LEFT CAROYID ENDARTERECTOMY WITH XENOSURE BOVINE PERICARDIUM PATCH ANGIOPLASTY;  Surgeon: [name], MD;  Location: [place];  Service: Vascular;  Laterality: Left;  . EYE SURGERY    . TONSILLECTOMY      OB History    No data available       Home Medications    Prior to Admission medications   Medication Sig Start Date End Date Taking? Authorizing Provider  chlorthalidone (HYGROTON) 25 MG tablet TAKE 1 TABLET (25 MG TOTAL) BY MOUTH DAILY. 05/04/17  Yes [provider]  CRANBERRY PO Take 1 tablet by mouth 2 (two)  times daily.   Yes [provider]  diltiazem (CARDIZEM) 30 MG tablet Take 30 mg by mouth daily.   Yes [provider]  ELIQUIS 5 MG TABS tablet TAKE 1 TABLET (5 MG TOTAL) BY MOUTH 2 (TWO) TIMES DAILY. 03/15/17  Yes Minus Breeding, MD  flecainide (TAMBOCOR) 150 MG tablet TAKE 0.5 TABLETS (75 MG TOTAL) BY MOUTH 2 (TWO) TIMES DAILY. 12/24/16  Yes Minus Breeding, MD  furosemide (LASIX) 20 MG tablet Take 20 mg by mouth daily as needed for fluid.  10/02/14 07/22/17 Yes [provider]  glipiZIDE (GLUCOTROL) 5 MG tablet Take 1 tablet (5 mg total) by mouth 2 (two) times daily. 05/12/17  Yes Timmothy Euler, MD  ibuprofen (ADVIL,MOTRIN) 200 MG tablet  Take 600 mg by mouth daily as needed for moderate pain.   Yes [provider]  levothyroxine (SYNTHROID, LEVOTHROID) 112 MCG tablet TAKE 1 TABLET BY MOUTH EVERY OTHER DAY, ALTERNATING WITH 1 & 1/2 TABLETS EVERY OTHER DAY 04/28/17  Yes Timmothy Euler, MD  losartan (COZAAR) 100 MG tablet Take 1 tablet (100 mg total) by mouth daily. 06/29/17  Yes Minus Breeding, MD  lovastatin (MEVACOR) 40 MG tablet Take 1 tablet (40 mg total) by mouth every evening. 06/29/17  Yes Hochrein, Jeneen Rinks, MD  MATZIM LA 180 MG 24 hr tablet TAKE ONE TABLET BY MOUTH ONE TIME DAILY 07/22/17  Yes Minus Breeding, MD  metFORMIN (GLUCOPHAGE) 500 MG tablet TAKE 2 TABLETS IN THE MORNING AND ONE TABLET IN THE EVENING 06/22/17  Yes Timmothy Euler, MD  metoprolol succinate (TOPROL-XL) 100 MG 24 hr tablet Take 1 tablet (100 mg total) by mouth daily. 05/12/17  Yes Timmothy Euler, MD  nystatin cream (MYCOSTATIN) Apply 1 application topically 2 (two) times daily. 05/04/17  Yes Timmothy Euler, MD  traMADol (ULTRAM) 50 MG tablet Take 1 tablet (50 mg total) by mouth every 6 (six) hours as needed for moderate pain or severe pain. 02/08/17  Yes Timmothy Euler, MD    Family History Family History  Problem Relation Age of Onset  . COPD Father   . Heart failure Father   . Heart disease Father   . Arrhythmia Sister   . Arrhythmia Sister   . Arrhythmia Sister        had PPM also  . Cancer Sister     Social History Social History   Tobacco Use  . Smoking status: Never Smoker  . Smokeless tobacco: Never Used  Substance Use Topics  . Alcohol use: No    Alcohol/week: 0.0 oz  . Drug use: No     Allergies   Quinapril hcl; Statins; and Tape   Review of Systems Review of Systems  Constitutional: Negative for appetite change, chills, diaphoresis, fatigue and fever.  HENT: Negative for mouth sores, sore throat and trouble swallowing.   Eyes: Negative for visual disturbance.  Respiratory: Negative for cough,  chest tightness, shortness of breath and wheezing.   Cardiovascular: Negative for chest pain.  Gastrointestinal: Positive for abdominal pain, nausea and vomiting. Negative for abdominal distention and diarrhea.  Endocrine: Negative for polydipsia, polyphagia and polyuria.  Genitourinary: Negative for dysuria, frequency and hematuria.  Musculoskeletal: Negative for gait problem.  Skin: Negative for color change, pallor and rash.  Neurological: Positive for weakness. Negative for dizziness, syncope, light-headedness and headaches.  Hematological: Does not bruise/bleed easily.  Psychiatric/Behavioral: Negative for behavioral problems and confusion.     Physical Exam Updated Vital Signs BP (!) 147/49  Pulse 61   Temp (!) 97.4 F (36.3 C) (Oral)   Resp 18   Ht 5\' 2"  (1.575 m)   Wt 107.5 kg (237 lb)   SpO2 94%   BMI 43.35 kg/m   Physical Exam  Constitutional: She is oriented to person, place, and time. She appears well-developed and well-nourished. No distress.  HENT:  Head: Normocephalic.  Eyes: Conjunctivae are normal. Pupils are equal, round, and reactive to light. No scleral icterus.  Neck: Normal range of motion. Neck supple. No thyromegaly present.  Cardiovascular: Normal rate and regular rhythm. Exam reveals no gallop and no friction rub.  No murmur heard. Pulmonary/Chest: Effort normal and breath sounds normal. No respiratory distress. She has no wheezes. She has no rales.  Abdominal: She exhibits no distension. There is no tenderness. There is no rebound.  Obese.  Soft abdomen.  No reproducible upper quadrant tenderness.  Musculoskeletal: Normal range of motion.  Neurological: She is alert and oriented to person, place, and time.  Skin: Skin is warm and dry. No rash noted.  Psychiatric: She has a normal mood and affect. Her behavior is normal.     ED Treatments / Results  Labs (all labs ordered are listed, but only abnormal results are displayed) Labs Reviewed  CBC  WITH DIFFERENTIAL/PLATELET - Abnormal; Notable for the following components:      Result Value   WBC 11.6 (*)    RBC 3.46 (*)    Hemoglobin 10.0 (*)    HCT 31.8 (*)    RDW 16.9 (*)    Neutro Abs 10.3 (*)    All other components within normal limits  COMPREHENSIVE METABOLIC PANEL - Abnormal; Notable for the following components:   Sodium 131 (*)    Chloride 94 (*)    Glucose, Bld 340 (*)    BUN 30 (*)    All other components within normal limits  URINALYSIS, ROUTINE W REFLEX MICROSCOPIC - Abnormal; Notable for the following components:   APPearance CLOUDY (*)    Glucose, UA >=500 (*)    Ketones, ur 20 (*)    Nitrite POSITIVE (*)    Leukocytes, UA LARGE (*)    Bacteria, UA MANY (*)    Squamous Epithelial / LPF 0-5 (*)    All other components within normal limits  LIPASE, BLOOD    EKG  EKG Interpretation None       Radiology US Abdomen Limited Ruq  Result Date: 07/22/2017 CLINICAL DATA:  Right upper quadrant pain.  Nausea and vomiting. EXAM: ULTRASOUND ABDOMEN LIMITED RIGHT UPPER QUADRANT COMPARISON:  CT 05/26/2014. FINDINGS: Gallbladder: No gallstones or wall thickening visualized. No sonographic Murphy sign noted by sonographer. Common bile duct: Diameter: 4 mm Liver: Increased echogenicity consistent fatty infiltration and/or hepatocellular disease. No focal hepatic abnormality identified. Portal vein is patent on color Doppler imaging with normal direction of blood flow towards the liver. IMPRESSION: 1. Increased hepatic echogenicity consistent fatty infiltration and/or hepatocellular disease. No focal hepatic abnormality identified. 2.  No gallstones or biliary distention. Electronically Signed   By: Marcello Moores  Register   On: 07/22/2017 16:08    Procedures Procedures (including critical care time)  Medications Ordered in ED Medications  ciprofloxacin (CIPRO) tablet 500 mg (not administered)   EKG:  Sinus rhythm, LVH     Initial Impression / Assessment and Plan / ED  Course  I have reviewed the triage vital signs and the nursing notes.  Pertinent labs & imaging results that were available during my care of  the patient were reviewed by me and considered in my medical decision making (see chart for details).   Plea vagal episode secondary to pain.  Possible biliary colic, acid related problem, intestinal colic.  Plan ultrasound, labs.  N.p.o. for now.  Asymptomatic currently.  No biliary tract abnormalities via labs, or ultrasound.  Urine appears infected.  She has had frequent UTIs and actually saw [place] [facility] regarding this.  Plan Cipro, Zofran, home.  Recheck worsening.  Urology follow-up.  Final Clinical Impressions(s) / ED Diagnoses   Final diagnoses:  RUQ pain  Pyelonephritis    ED Discharge Orders    None       Tanna Furry, MD 07/22/17 Virgina Jock, MD 08/02/17 1109

## 2017-07-22 NOTE — ED Notes (Signed)
Patient ambulatory to restroom  ?

## 2017-07-22 NOTE — Discharge Instructions (Signed)
Call [facility] for recheck appointment.

## 2017-07-23 ENCOUNTER — Other Ambulatory Visit: Payer: Medicare Other

## 2017-07-23 ENCOUNTER — Other Ambulatory Visit: Payer: Self-pay | Admitting: Family Medicine

## 2017-07-23 DIAGNOSIS — R195 Other fecal abnormalities: Secondary | ICD-10-CM

## 2017-07-23 NOTE — ED Provider Notes (Signed)
Pharmacy called.  Reportedly cannot fill Cipro due to drug interaction.  Also reportedly had been given another prescription for a different medication but does not know what it is.  States she threw it away because the pharmacy could not fill that one for a couple days.  Pharmacy calls back.  Reviewed cultures.  Will change to Augmentin for 10 days.   Davonna Belling, MD 07/23/17 [phone]

## 2017-07-23 NOTE — Addendum Note (Signed)
Addended by: Liliane Bade on: 07/23/2017 04:06 PM   Modules accepted: Orders

## 2017-07-26 LAB — FECAL OCCULT BLOOD, IMMUNOCHEMICAL: Fecal Occult Bld: NEGATIVE

## 2017-07-27 ENCOUNTER — Encounter: Payer: Self-pay | Admitting: Family Medicine

## 2017-07-27 ENCOUNTER — Ambulatory Visit (INDEPENDENT_AMBULATORY_CARE_PROVIDER_SITE_OTHER): Payer: Medicare Other | Admitting: Family Medicine

## 2017-07-27 VITALS — BP 121/55 | HR 58 | Temp 97.5°F | Ht 62.0 in | Wt 244.2 lb

## 2017-07-27 DIAGNOSIS — K921 Melena: Secondary | ICD-10-CM | POA: Diagnosis not present

## 2017-07-27 DIAGNOSIS — S76312A Strain of muscle, fascia and tendon of the posterior muscle group at thigh level, left thigh, initial encounter: Secondary | ICD-10-CM | POA: Diagnosis not present

## 2017-07-27 NOTE — Progress Notes (Signed)
   HPI  Patient presents today here for black stools.  Patient has had about 1 week of black stools.  Patient states that she has had maybe 1 to 2 black stools daily, at times she does not have one per day. In the interim she is also developed a UTI which progressed to pyelonephritis.  She was treated in the emergency room for this.  She feels better from a pyelonephritis perspective.  She does complain of fatigue and weakness.  FOBT was negative after the onset of black stools.  Pt also c/o L posterior thigh pain worse with sitting or walking up stairs Has been going on for months   PMH: Smoking status noted ROS: Per HPI  Objective: BP (!) 121/55   Pulse (!) 58   Temp (!) 97.5 F (36.4 C) (Oral)   Ht 5\' 2"  (1.575 m)   Wt 244 lb 3.2 oz (110.8 kg)   BMI 44.66 kg/m  Gen: NAD, alert, cooperative with exam HEENT: NCAT, EOMI, PERRL CV: RRR, good S1/S2, no murmur Resp: CTABL, no wheezes, non-labored Ext: No edema, warm Neuro: Alert and oriented, No gross deficits  No TTP of muscular structures of distal posterior thigh.   Assessment and plan:  #Melena Patient with hemoglobin dropped from 11.5-10 in just over a week. CBC today No signs of serious blood loss Recommended calling GI today for appt within 1-2 weeks  Discussed red flags for ed care RTC with any concerns Has had recent EGD and Cscope at [facility] in [place] ( approx 2-3 years)  Hamstring strain Most likely  HEP discussed with handout from sports medicine patient advisor.    Orders Placed This Encounter  Procedures  . CBC with [place], MD [facility] 07/27/2017, 2:44 PM

## 2017-07-28 ENCOUNTER — Telehealth: Payer: Self-pay | Admitting: Family Medicine

## 2017-07-28 ENCOUNTER — Telehealth (HOSPITAL_COMMUNITY): Payer: Self-pay

## 2017-07-28 LAB — CBC WITH DIFFERENTIAL/PLATELET
Basophils Absolute: 0 10*3/uL (ref 0.0–0.2)
Basos: 0 %
EOS (ABSOLUTE): 0.2 10*3/uL (ref 0.0–0.4)
EOS: 2 %
HEMATOCRIT: 28.6 % — AB (ref 34.0–46.6)
HEMOGLOBIN: 9.2 g/dL — AB (ref 11.1–15.9)
Immature Grans (Abs): 0.1 10*3/uL (ref 0.0–0.1)
Immature Granulocytes: 1 %
LYMPHS ABS: 1.4 10*3/uL (ref 0.7–3.1)
Lymphs: 14 %
MCH: 29.9 pg (ref 26.6–33.0)
MCHC: 32.2 g/dL (ref 31.5–35.7)
MCV: 93 fL (ref 79–97)
MONOCYTES: 6 %
MONOS ABS: 0.7 10*3/uL (ref 0.1–0.9)
NEUTROS ABS: 7.9 10*3/uL — AB (ref 1.4–7.0)
Neutrophils: 77 %
PLATELETS: 268 10*3/uL (ref 150–379)
RBC: 3.08 x10E6/uL — ABNORMAL LOW (ref 3.77–5.28)
RDW: 16.8 % — ABNORMAL HIGH (ref 12.3–15.4)
WBC: 10.3 10*3/uL (ref 3.4–10.8)

## 2017-07-28 NOTE — Telephone Encounter (Signed)
Patient called stating she has been feeling bad and not able to work. Her Hgb has been trending down over the last few weeks and she states she has been having dark stools. She actually passed out at home last weekend and went to the ER and was diagnosed with a kidney infection. She wanted her labs checked sooner than her 3/19 appt. Reviewed with provider, lab appt made for tomorrow at 0930. Explained to patient once we got lab results, we would contact her. She verbalized understanding.

## 2017-07-28 NOTE — Telephone Encounter (Signed)
In her CBC the hemoglobin is down from 10.0 to 9.2, this shows blood loss.  Dr. Wendi Snipes will see and address tomorrow. Did they make a plan for a referral or such?

## 2017-07-29 ENCOUNTER — Other Ambulatory Visit (HOSPITAL_COMMUNITY): Payer: Self-pay | Admitting: Adult Health

## 2017-07-29 ENCOUNTER — Inpatient Hospital Stay (HOSPITAL_COMMUNITY): Payer: Medicare Other

## 2017-07-29 DIAGNOSIS — D649 Anemia, unspecified: Secondary | ICD-10-CM | POA: Diagnosis not present

## 2017-07-29 DIAGNOSIS — D509 Iron deficiency anemia, unspecified: Secondary | ICD-10-CM

## 2017-07-29 LAB — CBC WITH DIFFERENTIAL/PLATELET
Basophils Absolute: 0.1 10*3/uL (ref 0.0–0.1)
Basophils Relative: 1 %
EOS ABS: 0.2 10*3/uL (ref 0.0–0.7)
Eosinophils Relative: 2 %
HEMATOCRIT: 28.1 % — AB (ref 36.0–46.0)
HEMOGLOBIN: 9.1 g/dL — AB (ref 12.0–15.0)
LYMPHS ABS: 1.2 10*3/uL (ref 0.7–4.0)
LYMPHS PCT: 11 %
MCH: 30.3 pg (ref 26.0–34.0)
MCHC: 32.4 g/dL (ref 30.0–36.0)
MCV: 93.7 fL (ref 78.0–100.0)
MONOS PCT: 7 %
Monocytes Absolute: 0.7 10*3/uL (ref 0.1–1.0)
NEUTROS ABS: 8.4 10*3/uL (ref 1.7–7.7)
NEUTROS PCT: 79 %
Platelets: 264 10*3/uL (ref 150–400)
RBC: 3 MIL/uL — ABNORMAL LOW (ref 3.87–5.11)
RDW: 17.6 % — ABNORMAL HIGH (ref 11.5–15.5)
WBC: 10.6 10*3/uL — AB (ref 4.0–10.5)

## 2017-07-29 LAB — IRON AND TIBC
IRON: 61 ug/dL (ref 28–170)
Saturation Ratios: 19 % (ref 10.4–31.8)
TIBC: 328 ug/dL (ref 250–450)
UIBC: 267 ug/dL

## 2017-07-29 LAB — FERRITIN: Ferritin: 34 ng/mL (ref 11–307)

## 2017-07-29 NOTE — Telephone Encounter (Signed)
Refer to lab note °

## 2017-07-30 ENCOUNTER — Other Ambulatory Visit: Payer: Self-pay | Admitting: Cardiology

## 2017-07-30 NOTE — Telephone Encounter (Signed)
Aware of results. 

## 2017-07-30 NOTE — Telephone Encounter (Signed)
Patient wants to talk to someone about her lab results from Tuesday. Could someone please call her at [phone].

## 2017-08-03 ENCOUNTER — Other Ambulatory Visit (HOSPITAL_COMMUNITY)
Admission: RE | Admit: 2017-08-03 | Discharge: 2017-08-03 | Disposition: A | Discharge status: Home / Self Care | Payer: Medicare Other | Source: Ambulatory Visit | Attending: Urology | Admitting: Urology

## 2017-08-03 ENCOUNTER — Ambulatory Visit (INDEPENDENT_AMBULATORY_CARE_PROVIDER_SITE_OTHER): Payer: Medicare Other | Admitting: Urology

## 2017-08-03 DIAGNOSIS — N3000 Acute cystitis without hematuria: Secondary | ICD-10-CM

## 2017-08-05 ENCOUNTER — Encounter (HOSPITAL_COMMUNITY): Payer: Self-pay

## 2017-08-05 ENCOUNTER — Ambulatory Visit (HOSPITAL_COMMUNITY): Payer: Medicare Other

## 2017-08-05 ENCOUNTER — Inpatient Hospital Stay (HOSPITAL_COMMUNITY): Payer: Medicare Other | Attending: Oncology

## 2017-08-05 ENCOUNTER — Telehealth: Payer: Self-pay | Admitting: Family Medicine

## 2017-08-05 VITALS — BP 115/39 | HR 58 | Temp 98.5°F | Resp 18

## 2017-08-05 DIAGNOSIS — E119 Type 2 diabetes mellitus without complications: Secondary | ICD-10-CM | POA: Insufficient documentation

## 2017-08-05 DIAGNOSIS — Z7901 Long term (current) use of anticoagulants: Secondary | ICD-10-CM | POA: Insufficient documentation

## 2017-08-05 DIAGNOSIS — K921 Melena: Secondary | ICD-10-CM | POA: Diagnosis not present

## 2017-08-05 DIAGNOSIS — I10 Essential (primary) hypertension: Secondary | ICD-10-CM | POA: Diagnosis not present

## 2017-08-05 DIAGNOSIS — D509 Iron deficiency anemia, unspecified: Secondary | ICD-10-CM | POA: Diagnosis not present

## 2017-08-05 DIAGNOSIS — E039 Hypothyroidism, unspecified: Secondary | ICD-10-CM | POA: Insufficient documentation

## 2017-08-05 DIAGNOSIS — R0602 Shortness of breath: Secondary | ICD-10-CM | POA: Diagnosis not present

## 2017-08-05 DIAGNOSIS — E538 Deficiency of other specified B group vitamins: Secondary | ICD-10-CM | POA: Diagnosis not present

## 2017-08-05 DIAGNOSIS — D500 Iron deficiency anemia secondary to blood loss (chronic): Secondary | ICD-10-CM | POA: Diagnosis not present

## 2017-08-05 LAB — URINE CULTURE: Culture: <10000 — AB

## 2017-08-05 MED ORDER — SODIUM CHLORIDE 0.9 % IV SOLN
510.0000 mg | Freq: Once | INTRAVENOUS | Status: AC
  Administered 2017-08-05: 510 mg via INTRAVENOUS
  Filled 2017-08-05: qty 17

## 2017-08-05 MED ORDER — SODIUM CHLORIDE 0.9 % IV SOLN
INTRAVENOUS | Status: DC
  Administered 2017-08-05: 12:00:00 via INTRAVENOUS

## 2017-08-05 NOTE — Telephone Encounter (Signed)
Will aslk nursing to call patient and ask about symptom and confirm with infusion center.   Her MAP would be >60 in any scenario >118/31 which is technically safe but is a surprisingly low diastolic pressure.   Reduce losartan to 1/2 tab daily. ( 50 mg once daily)   Laroy Apple, MD [facility] 08/05/2017, 2:30 PM

## 2017-08-05 NOTE — Telephone Encounter (Signed)
I agree, I would recommend discussing with cardiology.   AN alternative would be decreasing her diuretic as she is on lasix + chlorthalidone. A little extra volume would likely increase diastolic bp   Laroy Apple, MD [facility] 08/05/2017, 2:52 PM

## 2017-08-05 NOTE — Patient Instructions (Signed)
[facility] Discharge Instructions  Received Feraheme infusion today. Follow-up as scheduled. Call clinic for any questions or concerns   Thank you for choosing [facility] to provide your oncology and hematology care.  To afford each patient quality time with our provider, please arrive at least 15 minutes before your scheduled appointment time.   If you have a lab appointment with the Cancer Center please come in thru the  Main Entrance and check in at the main information desk  You need to re-schedule your appointment should you arrive 10 or more minutes late.  We strive to give you quality time with our providers, and arriving late affects you and other patients whose appointments are after yours.  Also, if you no show three or more times for appointments you may be dismissed from the clinic at the providers discretion.     Again, thank you for choosing [facility].  Our hope is that these requests will decrease the amount of time that you wait before being seen by our physicians.       _____________________________________________________________  Should you have questions after your visit to [facility], please contact our office at [phone] between the hours of 8:30 a.m. and 4:30 p.m.  Voicemails left after 4:30 p.m. will not be returned until the following business day.  For prescription refill requests, have your pharmacy contact our office.       Resources For Cancer Patients and their Caregivers ? American Cancer Society: Can assist with transportation, wigs, general needs, runs Look Good Feel Better.        1-888-227-6333 ? Cancer Care: Provides financial assistance, online support groups, medication/co-pay assistance.  1-800-813-HOPE (4673) ? [facility] Assists [place] cancer patients and their families through emotional , educational and  financial support.  336-427-4357 ? [place] DSS Where to apply for food stamps, Medicaid and utility assistance. 336-342-1394 ? [facility]: Transportation to medical appointments. 336-347-2287 ? Social Security Administration: May apply for disability if have a Stage IV cancer. 336-342-7796 1-800-772-1213 ? [place] Aging, Disability and Transit Services: Assists with nutrition, care and transit needs. 336-349-2343  Cancer Center Support Programs:   > Cancer Support Group  2nd Tuesday of the month 1pm-2pm, Journey Room   > Creative Journey  3rd Tuesday of the month 1130am-1pm, Journey Room    

## 2017-08-05 NOTE — Progress Notes (Signed)
Venda Raffety tolerated Feraheme infusion well without complaints or incident. VSS upon discharge. Pt discharged self ambulatory in satisfactory condition

## 2017-08-05 NOTE — Telephone Encounter (Signed)
Bp readings were showed to Br. [name] that were documented in Epic. Patient aware of recommendation and also advised to reach out to Dr. Rosezella [place] office since he just increased her losartan to 100mg  and advise them of what is going on. Patient was also on her way to see GI today. Patient advised to ask them to also recheck her BP.

## 2017-08-06 ENCOUNTER — Telehealth: Payer: Self-pay | Admitting: Cardiology

## 2017-08-06 NOTE — Telephone Encounter (Signed)
New message   Pt c/o BP issue: STAT if pt c/o blurred vision, one-sided weakness or slurred speech  1. What are your last 5 BP readings? 126/30  2. Are you having any other symptoms (ex. Dizziness, headache, blurred vision, passed out)?  Not having any of these symptoms , but she is feeling really weak   3. What is your BP issue? Went for a iron infusion at [facility] 08/05/17 , when they checked her bp it was (this was 2 hours after she took her medication ) 126/30 they said for her to contact you about her medication, but yesterday afternoon it was back to normal, when she went to GI doctor

## 2017-08-06 NOTE — Telephone Encounter (Signed)
Spoke with patient and she is just feeling tired and has no energy. This is nothing new since she has been having the low iron issues. She had her blood pressure checked 3 times yesterday when she had her infusion and all DBP readings in the 30's. She does not check her blood pressure at home. She was told to contact her physician. She called her PCP and he wanted to reduce Losartan and she told him Dr Percival Spanish increased. PCP recommended call Dr Percival Spanish. Will forward to Dr Percival Spanish for review

## 2017-08-09 ENCOUNTER — Other Ambulatory Visit: Payer: Self-pay

## 2017-08-09 ENCOUNTER — Ambulatory Visit (HOSPITAL_COMMUNITY)
Admission: RE | Admit: 2017-08-09 | Discharge: 2017-08-09 | Disposition: A | Discharge status: Home / Self Care | Payer: Medicare Other | Source: Ambulatory Visit | Attending: Family | Admitting: Family

## 2017-08-09 ENCOUNTER — Encounter: Payer: Self-pay | Admitting: Family

## 2017-08-09 ENCOUNTER — Ambulatory Visit (HOSPITAL_BASED_OUTPATIENT_CLINIC_OR_DEPARTMENT_OTHER): Payer: Medicare Other

## 2017-08-09 ENCOUNTER — Other Ambulatory Visit (HOSPITAL_COMMUNITY): Payer: Medicare Other

## 2017-08-09 ENCOUNTER — Ambulatory Visit (INDEPENDENT_AMBULATORY_CARE_PROVIDER_SITE_OTHER): Payer: Medicare Other | Admitting: Family

## 2017-08-09 VITALS — BP 107/52 | HR 58 | Temp 97.6°F | Resp 16 | Ht 63.0 in | Wt 245.0 lb

## 2017-08-09 DIAGNOSIS — Z9889 Other specified postprocedural states: Secondary | ICD-10-CM

## 2017-08-09 DIAGNOSIS — E119 Type 2 diabetes mellitus without complications: Secondary | ICD-10-CM | POA: Diagnosis not present

## 2017-08-09 DIAGNOSIS — I4891 Unspecified atrial fibrillation: Secondary | ICD-10-CM | POA: Diagnosis not present

## 2017-08-09 DIAGNOSIS — I6523 Occlusion and stenosis of bilateral carotid arteries: Secondary | ICD-10-CM

## 2017-08-09 DIAGNOSIS — Z8249 Family history of ischemic heart disease and other diseases of the circulatory system: Secondary | ICD-10-CM | POA: Diagnosis not present

## 2017-08-09 DIAGNOSIS — G4733 Obstructive sleep apnea (adult) (pediatric): Secondary | ICD-10-CM | POA: Diagnosis not present

## 2017-08-09 DIAGNOSIS — I10 Essential (primary) hypertension: Secondary | ICD-10-CM | POA: Insufficient documentation

## 2017-08-09 DIAGNOSIS — R011 Cardiac murmur, unspecified: Secondary | ICD-10-CM | POA: Diagnosis not present

## 2017-08-09 DIAGNOSIS — E669 Obesity, unspecified: Secondary | ICD-10-CM | POA: Diagnosis not present

## 2017-08-09 DIAGNOSIS — Z6841 Body Mass Index (BMI) 40.0 and over, adult: Secondary | ICD-10-CM | POA: Diagnosis not present

## 2017-08-09 LAB — ECHOCARDIOGRAM COMPLETE
Height: 63 in
WEIGHTICAEL: 3920 [oz_av]

## 2017-08-09 NOTE — Patient Instructions (Signed)

## 2017-08-09 NOTE — Progress Notes (Signed)
Chief Complaint: Follow up Extracranial Carotid Artery Stenosis   History of Present Illness  Emily [name] is a 67 y.o. female who is status post left carotid endarterectomy with bovine pericardial patch angioplasty on 3 07/31/2015 by Dr. Trula Slade. This was done for asymptomatic bilateral carotid stenosis, left greater than right. Intraoperative findings included a 85% stenosis. Her postoperative follow-up was uncomplicated and she was discharged to home the following day.She stated that her left middle finger was a little numb after the procedure. She was a very difficult A-line.   She reports no new neurologic symptoms. She has had pain in her right leg which has been attributed to musculoskeletal.  Dr. Trula Slade last evaluated pt on 08-03-16. At that time right extracranial carotid artery stenosis remained in the 60-79% category. The left endarterectomy site was widely patent.The patient remained neurologically intact. We will continue with duplex ultrasound surveillance in 6 months.  She denies any known history of stroke or TIA. Specifically she deniesa history of amaurosis fugax or monocular blindness, unilateral facial drooping, hemiplegia, orreceptive or expressive aphasia.    She denies claudication symptoms with walking when her iron levels are closer to normal, otherwise she feels generalized fatigue and weakness.  She has UTI symptoms and states she is seeing Dr. Wendi Snipes.    She states the use of her C-PAP has helped control her atrial fib.   She is having GI work up for anemia, is getting iron infusions managed by her hematologist.   Pt Diabetic: yes, states her last A1C was 8.? 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 Percival Spanish.She takes the Matzim only if she feels her heart rate is too fast  Past  Medical History:  Diagnosis Date  . Anemia   . Arthritis   . Bilateral carotid artery disease ([place])   . Diabetes mellitus without complication ([place])   . Difficult intubation    states 'lady that did the sleep study told me I have the smallest airway she has ever seen in an adult"  . Dysrhythmia   . Heart murmur   . Hypertension   . Hypothyroid   . Obesity   . Obstructive sleep apnea    on C Pap  . PAF (paroxysmal atrial fibrillation) ([place])    a. newly dx in 09/2013; on eliquis  . UTI (urinary tract infection)     Social History Social History   Tobacco Use  . Smoking status: Never Smoker  . Smokeless tobacco: Never Used  Substance Use Topics  . Alcohol use: No    Alcohol/week: 0.0 oz  . Drug use: No    Family History Family History  Problem Relation Age of Onset  . COPD Father   . Heart failure Father   . Heart disease Father   . Arrhythmia Sister   . Arrhythmia Sister   . Arrhythmia Sister        had PPM also  . Cancer Sister     Surgical History Past Surgical History:  Procedure Laterality Date  . CATARACT EXTRACTION Left   . CATARACT EXTRACTION W/PHACO Right 12/09/2015   Procedure: CATARACT EXTRACTION PHACO AND INTRAOCULAR LENS PLACEMENT (IOC);  Surgeon: Tonny Branch, MD;  Location: [facility];  Service: Ophthalmology;  Laterality: Right;  CDE:10.48  . COLONOSCOPY W/ POLYPECTOMY    . cyst on back of neck removed    .  DILATION AND CURETTAGE OF UTERUS     x 5  . ENDARTERECTOMY Left 08/30/2015   Procedure: LEFT CAROYID ENDARTERECTOMY WITH XENOSURE BOVINE PERICARDIUM PATCH ANGIOPLASTY;  Surgeon: [name], MD;  Location: [facility];  Service: Vascular;  Laterality: Left;  . EYE SURGERY    . TONSILLECTOMY      Allergies  Allergen Reactions  . Quinapril Hcl Cough  . Statins Other (See Comments)    Not all Statins but some cause cough and pain in legs.  . Tape Other (See Comments)    Redness, please use "paper" tape    Current Outpatient Medications   Medication Sig Dispense Refill  . amoxicillin-clavulanate (AUGMENTIN) 875-125 MG tablet Take 1 tablet by mouth 2 (two) times daily.  0  . chlorthalidone (HYGROTON) 25 MG tablet TAKE 1 TABLET (25 MG TOTAL) BY MOUTH DAILY. 90 tablet 2  . CRANBERRY PO Take 1 tablet by mouth 2 (two) times daily.    Marland Kitchen diltiazem (CARDIZEM) 30 MG tablet Take 30 mg by mouth daily.    Marland Kitchen ELIQUIS 5 MG TABS tablet TAKE 1 TABLET (5 MG TOTAL) BY MOUTH 2 (TWO) TIMES DAILY. 60 tablet 5  . flecainide (TAMBOCOR) 150 MG tablet TAKE 0.5 TABLETS (75 MG TOTAL) BY MOUTH 2 (TWO) TIMES DAILY. 90 tablet 3  . glipiZIDE (GLUCOTROL) 5 MG tablet Take 1 tablet (5 mg total) by mouth 2 (two) times daily. 180 tablet 0  . levothyroxine (SYNTHROID, LEVOTHROID) 112 MCG tablet TAKE 1 TABLET BY MOUTH EVERY OTHER DAY, ALTERNATING WITH 1 & 1/2 TABLETS EVERY OTHER DAY 45 tablet 2  . losartan (COZAAR) 100 MG tablet Take 1 tablet (100 mg total) by mouth daily. 90 tablet 3  . lovastatin (MEVACOR) 40 MG tablet Take 1 tablet (40 mg total) by mouth every evening. 90 tablet 3  . MATZIM LA 180 MG 24 hr tablet TAKE ONE TABLET BY MOUTH ONE TIME DAILY 60 tablet 11  . metFORMIN (GLUCOPHAGE) 500 MG tablet TAKE 2 TABLETS IN THE MORNING AND ONE TABLET IN THE EVENING 270 tablet 3  . metoprolol succinate (TOPROL-XL) 100 MG 24 hr tablet Take 1 tablet (100 mg total) by mouth daily. 90 tablet 1  . nystatin cream (MYCOSTATIN) Apply 1 application topically 2 (two) times daily. 30 g 1  . traMADol (ULTRAM) 50 MG tablet Take 1 tablet (50 mg total) by mouth every 6 (six) hours as needed for moderate pain or severe pain. 30 tablet 0  . furosemide (LASIX) 20 MG tablet Take 20 mg by mouth daily as needed for fluid.     Marland Kitchen ibuprofen (ADVIL,MOTRIN) 200 MG tablet Take 600 mg by mouth daily as needed for moderate pain.    Marland Kitchen ondansetron (ZOFRAN ODT) 4 MG disintegrating tablet Take 1 tablet (4 mg total) by mouth every 8 (eight) hours as needed for nausea. (Patient not taking: Reported on  08/09/2017) 6 tablet 0   Current Facility-Administered Medications  Medication Dose Route Frequency Provider Last Rate Last Dose  . cyanocobalamin ((VITAMIN B-12)) injection 1,000 mcg  1,000 mcg Intramuscular Q30 days Timmothy Euler, MD   1,000 mcg at 07/20/17 1050    Review of Systems : See HPI for pertinent positives and negatives.  Physical Examination  Vitals:   08/09/17 1150 08/09/17 1153  BP: (!) 118/51 (!) 107/52  Pulse: (!) 58 (!) 58  Resp: 16   Temp: 97.6 F (36.4 C)   TempSrc: Oral   SpO2: 98%   Weight: 245 lb (111.1 kg)  Height: 5\' 3"  (1.6 m)    Body mass index is 43.4 kg/m.  General: WDWN morbidly obese female in NAD GAIT: normal HENT: Large neck Eyes: PERRLA Pulmonary:  Respirations are non-labored, good air movement, CTAB, no rales,  rhonchi, or wheezing. Cardiac: regular rhythm, + murmur.  VASCULAR EXAM Carotid Bruits Right Left   Positive Positive     Abdominal aortic pulse is not palpable. Radial pulses are 2+ palpable and equal.                                                                                                                                          LE Pulses Right Left       POPLITEAL  not palpable   not palpable       POSTERIOR TIBIAL  not palpable   not palpable        DORSALIS PEDIS      ANTERIOR TIBIAL 2+ palpable  2+ palpable     Gastrointestinal: soft, nontender, BS WNL, no r/g, no palpable masses, large panus. Musculoskeletal: No muscle atrophy/wasting. M/S 5/5 throughout, extremities without ischemic changes. Neurologic:  A&O X 3; appropriate affect, sensation is normal; speech is normal, CN 2-12 intact, pain and light touch intact in extremities, motor exam as listed above. Psychiatric: Normal thought content, mood appropriate to clinical situation.    Assessment: Mariadejesus Cade is a 67 y.o. female who is status post left carotid endarterectomy with bovine pericardial patch angioplasty on 3  07/31/2015. She has no history of stroke or TIA.   Her atherosclerotic risk factors include uncontrolled DM, PAF (takes Eliquis), OSA (uses CPAP), and morbid obesity.  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   DATA Carotid Duplex (08/09/17): Right ICA: 60-79% stenosis. Left ICA: CEA site. 40-59% stenosis  Left ECA: >50% stenosis  Bilateral vertebral artery flow is antegrade.  Bilateral subclavian artery waveforms are normal.  Increased stenosis of the left IC compared to the exam on 02-08-17 in which there was no stenosis in the left ICA.    Plan: Follow-up in 6 months with Carotid Duplex scan.    I discussed in depth with the patient the nature of atherosclerosis, and emphasized the importance of maximal medical management including strict control of blood pressure, blood glucose, and lipid levels, obtaining regular exercise, and continued cessation of smoking.  The patient is aware that without maximal medical management the underlying atherosclerotic disease process will progress, limiting the benefit of any interventions. The patient was given information about stroke prevention and what symptoms should prompt the patient to seek immediate medical care. Thank you for allowing [place] to participate in this patient's care.  Emily Chambers, RN, MSN, FNP-C [facility] Office: [facility] Physician: Trula Slade  08/09/17 12:24 PM

## 2017-08-10 ENCOUNTER — Other Ambulatory Visit: Payer: Self-pay

## 2017-08-10 DIAGNOSIS — I6523 Occlusion and stenosis of bilateral carotid arteries: Secondary | ICD-10-CM

## 2017-08-10 NOTE — Telephone Encounter (Signed)
LMTCB

## 2017-08-10 NOTE — Telephone Encounter (Signed)
OK to reduce the Cozaar as she has done.  I would suggest a nurse appt for BP check and she can bring her cuff and make sure that it is accurate and then keep a BP diary at home.

## 2017-08-10 NOTE — Telephone Encounter (Signed)
Returned call to patient Dr.Hochrein's recommendations given.She stated she will have B/P checked with PCP.Advised to keep a diary of home B/P readings and bring to next appointment.

## 2017-08-10 NOTE — Telephone Encounter (Signed)
Mrs.Merendino is calling because her BP is running low .She cut her bp medication in half and on yesterday her BP was 108/54 . Please call

## 2017-08-10 NOTE — Telephone Encounter (Signed)
Patient returned call. She called in last week concerning low BPs, reports diastolic readings in the 35D at iron infusion appts and states BP was checked 3 times. She has not since checked her BP at home. She does report she saw a GI doc last week and her BP was in normal range. She saw provider at [facility] yesterday and BP was 107/52. She decided over the weekend to decrease her losartan from 100mg  QD to 50mg  QD at recommendation of PCP (who also asked her to seek advice from cardiology). She reports fatigue but also has low Hgb. She would like MD opinion going forward. Advised that she continue losartan 50mg  daily and I would send message to Dr. Percival Spanish for review/advice

## 2017-08-12 ENCOUNTER — Inpatient Hospital Stay (HOSPITAL_COMMUNITY): Payer: Medicare Other

## 2017-08-12 ENCOUNTER — Encounter (HOSPITAL_COMMUNITY): Payer: Self-pay

## 2017-08-12 ENCOUNTER — Other Ambulatory Visit: Payer: Self-pay

## 2017-08-12 VITALS — BP 129/46 | HR 64 | Temp 98.2°F | Resp 18

## 2017-08-12 DIAGNOSIS — D509 Iron deficiency anemia, unspecified: Secondary | ICD-10-CM | POA: Diagnosis not present

## 2017-08-12 DIAGNOSIS — I10 Essential (primary) hypertension: Secondary | ICD-10-CM | POA: Diagnosis not present

## 2017-08-12 DIAGNOSIS — R0602 Shortness of breath: Secondary | ICD-10-CM | POA: Diagnosis not present

## 2017-08-12 DIAGNOSIS — E538 Deficiency of other specified B group vitamins: Secondary | ICD-10-CM | POA: Diagnosis not present

## 2017-08-12 DIAGNOSIS — E039 Hypothyroidism, unspecified: Secondary | ICD-10-CM | POA: Diagnosis not present

## 2017-08-12 DIAGNOSIS — K921 Melena: Secondary | ICD-10-CM | POA: Diagnosis not present

## 2017-08-12 MED ORDER — SODIUM CHLORIDE 0.9 % IV SOLN
510.0000 mg | Freq: Once | INTRAVENOUS | Status: AC
  Administered 2017-08-12: 510 mg via INTRAVENOUS
  Filled 2017-08-12: qty 17

## 2017-08-12 MED ORDER — SODIUM CHLORIDE 0.9 % IV SOLN
INTRAVENOUS | Status: DC
  Administered 2017-08-12: 14:00:00 via INTRAVENOUS

## 2017-08-12 NOTE — Progress Notes (Signed)
Tolerated infusion w/o adverse reaction.  Alert, in no distress.  VSS.  Discharged ambulatory.  

## 2017-08-16 ENCOUNTER — Other Ambulatory Visit (HOSPITAL_COMMUNITY): Payer: Self-pay | Admitting: *Deleted

## 2017-08-16 ENCOUNTER — Ambulatory Visit (HOSPITAL_COMMUNITY): Payer: Medicare Other

## 2017-08-16 DIAGNOSIS — E538 Deficiency of other specified B group vitamins: Secondary | ICD-10-CM

## 2017-08-16 DIAGNOSIS — D509 Iron deficiency anemia, unspecified: Secondary | ICD-10-CM

## 2017-08-17 ENCOUNTER — Telehealth: Payer: Self-pay

## 2017-08-17 ENCOUNTER — Inpatient Hospital Stay (HOSPITAL_COMMUNITY): Payer: Medicare Other

## 2017-08-17 ENCOUNTER — Ambulatory Visit (INDEPENDENT_AMBULATORY_CARE_PROVIDER_SITE_OTHER): Payer: Medicare Other | Admitting: *Deleted

## 2017-08-17 ENCOUNTER — Encounter (HOSPITAL_COMMUNITY): Payer: Self-pay | Admitting: Internal Medicine

## 2017-08-17 ENCOUNTER — Inpatient Hospital Stay (HOSPITAL_BASED_OUTPATIENT_CLINIC_OR_DEPARTMENT_OTHER): Payer: Medicare Other | Admitting: Internal Medicine

## 2017-08-17 ENCOUNTER — Other Ambulatory Visit: Payer: Self-pay

## 2017-08-17 VITALS — BP 137/45 | HR 70 | Temp 98.3°F | Resp 20 | Wt 251.6 lb

## 2017-08-17 DIAGNOSIS — E039 Hypothyroidism, unspecified: Secondary | ICD-10-CM

## 2017-08-17 DIAGNOSIS — E119 Type 2 diabetes mellitus without complications: Secondary | ICD-10-CM

## 2017-08-17 DIAGNOSIS — Z7901 Long term (current) use of anticoagulants: Secondary | ICD-10-CM | POA: Diagnosis not present

## 2017-08-17 DIAGNOSIS — E538 Deficiency of other specified B group vitamins: Secondary | ICD-10-CM

## 2017-08-17 DIAGNOSIS — R0602 Shortness of breath: Secondary | ICD-10-CM | POA: Diagnosis not present

## 2017-08-17 DIAGNOSIS — D509 Iron deficiency anemia, unspecified: Secondary | ICD-10-CM | POA: Diagnosis not present

## 2017-08-17 DIAGNOSIS — K921 Melena: Secondary | ICD-10-CM | POA: Diagnosis not present

## 2017-08-17 DIAGNOSIS — I10 Essential (primary) hypertension: Secondary | ICD-10-CM

## 2017-08-17 LAB — CBC WITH DIFFERENTIAL/PLATELET
Basophils Absolute: 0 10*3/uL (ref 0.0–0.1)
Basophils Relative: 0 %
EOS ABS: 0.2 10*3/uL (ref 0.0–0.7)
EOS PCT: 2 %
HCT: 25.7 % — ABNORMAL LOW (ref 36.0–46.0)
Hemoglobin: 8 g/dL — ABNORMAL LOW (ref 12.0–15.0)
LYMPHS ABS: 1.3 10*3/uL (ref 0.7–4.0)
Lymphocytes Relative: 12 %
MCH: 31.5 pg (ref 26.0–34.0)
MCHC: 31.1 g/dL (ref 30.0–36.0)
MCV: 101.2 fL — ABNORMAL HIGH (ref 78.0–100.0)
MONOS PCT: 7 %
Monocytes Absolute: 0.8 10*3/uL (ref 0.1–1.0)
Neutro Abs: 8.1 10*3/uL — ABNORMAL HIGH (ref 1.7–7.7)
Neutrophils Relative %: 79 %
PLATELETS: 246 10*3/uL (ref 150–400)
RBC: 2.54 MIL/uL — ABNORMAL LOW (ref 3.87–5.11)
RDW: 20.8 % — ABNORMAL HIGH (ref 11.5–15.5)
WBC: 10.3 10*3/uL (ref 4.0–10.5)

## 2017-08-17 LAB — BASIC METABOLIC PANEL
ANION GAP: 12 (ref 5–15)
BUN: 19 mg/dL (ref 6–20)
CALCIUM: 8.9 mg/dL (ref 8.9–10.3)
CO2: 25 mmol/L (ref 22–32)
Chloride: 97 mmol/L — ABNORMAL LOW (ref 101–111)
Creatinine, Ser: 0.64 mg/dL (ref 0.44–1.00)
GFR calc Af Amer: >60 mL/min (ref >60)
GLUCOSE: 267 mg/dL — AB (ref 65–99)
Potassium: 4 mmol/L (ref 3.5–5.1)
SODIUM: 134 mmol/L — AB (ref 135–145)

## 2017-08-17 LAB — IRON AND TIBC
IRON: 50 ug/dL (ref 28–170)
Saturation Ratios: 13 % (ref 10.4–31.8)
TIBC: 392 ug/dL (ref 250–450)
UIBC: 342 ug/dL

## 2017-08-17 LAB — FOLATE: Folate: 23.1 ng/mL (ref >5.9)

## 2017-08-17 LAB — FERRITIN: Ferritin: 143 ng/mL (ref 11–307)

## 2017-08-17 MED ORDER — FUROSEMIDE 20 MG PO TABS: 20.0000 mg | ORAL_TABLET | Freq: Every day | ORAL | 3 refills | Status: DC | PRN

## 2017-08-17 NOTE — Progress Notes (Signed)
Diagnosis Iron deficiency anemia, unspecified iron deficiency anemia type - Plan: CBC with Differential/Platelet, Comprehensive metabolic panel, Lactate dehydrogenase, Protein electrophoresis, serum, Ferritin, Vitamin B12, Folate, Haptoglobin, Methylmalonic acid, serum, Direct antiglobulin test  Staging Cancer Staging No matching staging information was found for the patient.  Assessment and Plan: 1.  Marocytic Anemia: Patient was previously followed by Dr. Talbert Cage and has also seen nurse practitioner Renato Battles.  She was reportedly diagnosed with iron deficiency anemia with a ferritin of 9.  Se has received IV Feraheme most recently August 12, 2017.  She has ongoing fatigue.  Labs performed 08/17/2017 showed a white count 10.3 hemoglobin 8 platelets 246,000 creatinine is 0.64.  Ferritin level in February 2019 was 34.  Long talk held with the patient today concerning the fact that her hemoglobin is decreased at 8.  I discussed with her if she has symptomatic anemia she would have the option of blood transfusion to determine if that would improve some of her symptoms.  She desires to wait and will have repeat labs performed next week.  She is also planned for GI evaluation due to dark stools.  He reports a history of being on Eliquis and I have discussed with her potential bleeding risks associated with that medication.  She will have repeat lab evaluation in 3 weeks pending the results of her labs next week which she will get at PCP office.  I will check CBC chemistries, LDH, ferritin and serum protein electrophoresis, haptoglobin and [place] test.  We will also obtain records from GI regarding her capsule endoscopy study.  She is advised to return to the clinic or go to the ER if worsening symptoms prior to her next evaluation.  All questions answered.  Pt has reported history of B12 deficiency.  She denies any gastric bypass surgery.    2.  Hypertension.  BP is 137/45.  Continue to followup with PCP.    3.   Dark stools.  Pt reports she is on Eliquis.  She is scheduled for GI evaluation.  I have discussed with her potential bleeding risk associated with Eliquis.  4.  Hypothyroidism.  I have discussed with her thyroid disease may also contribute to anemia.  She reportedly is having her levels monitored through her PCP.  5.  B12 deficiency.  She reports she is currently on vitamin B12.  6.  SOB.  Pulse ox is 99% on room air.  She was given option of blood transfusion to determine if symptoms improve.  She should go to ER or notify office if symptoms worsen.    Interval History:   67 y.o. Female with history of chronic normocytic anemia.  Patient had blood work done on 05/04/2017 which demonstrated WBC 9K, hemoglobin 9.5 g/dL, hematocrit 30.5%, MCV 92, platelet count 280 K.  Hemoglobin on 02/16/2017 was 11.8 g/dL.  Hemoglobin from 1 year ago on 12/04/15 was 9.5 g/dL.  Patient states that she feels chronically fatigued.  She states she had a EGD and colonoscopy performed in [place] 2 years ago and they were both normal.  She states that she has been taking slow release oral iron tablet daily for the past 1 year without major improvement in her anemia.    Current Status: Patient is seen today for follow-up.  She is complaining of extreme fatigue.  She also reports some dark stools.  He also reports some shortness of breath.  Problem List Patient Active Problem List   Diagnosis Date Noted  . Peripheral arterial disease ([place]) [  I73.9] 07/06/2017  . Hyperlipidemia [E78.5] 06/29/2017  . Dyslipidemia [E78.5] 06/29/2017  . Iron deficiency anemia [D50.9] 05/27/2017  . Vitamin B12 deficiency [E53.8] 05/27/2017  . Pain in joint, ankle and foot [M25.579] 06/03/2016  . Carotid stenosis [I65.29] 08/30/2015  . Bilateral carotid artery disease ([place]) [I77.9] 07/05/2015  . Acute bronchitis [J20.9] 01/31/2015  . Diabetes mellitus without complication ([place]) [Y85.0]   . Diabetes ([place]) [E11.9] 10/27/2013  .  Hypertension [I10] 10/27/2013  . Hypothyroidism [E03.9] 10/27/2013  . Atrial fibrillation, rapid [facility]) [I48.91] 10/27/2013    Past Medical History Past Medical History:  Diagnosis Date  . Anemia   . Arthritis   . Bilateral carotid artery disease ([place])   . Diabetes mellitus without complication ([place])   . Difficult intubation    states 'lady that did the sleep study told me I have the smallest airway she has ever seen in an adult"  . Dysrhythmia   . Heart murmur   . Hypertension   . Hypothyroid   . Obesity   . Obstructive sleep apnea    on C Pap  . PAF (paroxysmal atrial fibrillation) ([place])    a. newly dx in 09/2013; on eliquis  . UTI (urinary tract infection)     Past Surgical History Past Surgical History:  Procedure Laterality Date  . CATARACT EXTRACTION Left   . CATARACT EXTRACTION W/PHACO Right 12/09/2015   Procedure: CATARACT EXTRACTION PHACO AND INTRAOCULAR LENS PLACEMENT (IOC);  Surgeon: Tonny Branch, MD;  Location: [facility];  Service: Ophthalmology;  Laterality: Right;  CDE:10.48  . COLONOSCOPY W/ POLYPECTOMY    . cyst on back of neck removed    . DILATION AND CURETTAGE OF UTERUS     x 5  . ENDARTERECTOMY Left 08/30/2015   Procedure: LEFT CAROYID ENDARTERECTOMY WITH XENOSURE BOVINE PERICARDIUM PATCH ANGIOPLASTY;  Surgeon: [name], MD;  Location: [facility];  Service: Vascular;  Laterality: Left;  . EYE SURGERY    . TONSILLECTOMY      Family History Family History  Problem Relation Age of Onset  . COPD Father   . Heart failure Father   . Heart disease Father   . Arrhythmia Sister   . Arrhythmia Sister   . Arrhythmia Sister        had PPM also  . Cancer Sister      Social History  reports that  has never smoked. she has never used smokeless tobacco. She reports that she does not drink alcohol or use drugs.  Medications  Current Outpatient Medications:  .  amoxicillin-clavulanate (AUGMENTIN) 875-125 MG tablet, Take 1 tablet by mouth 2 (two) times daily.,  Disp: , Rfl: 0 .  chlorthalidone (HYGROTON) 25 MG tablet, TAKE 1 TABLET (25 MG TOTAL) BY MOUTH DAILY., Disp: 90 tablet, Rfl: 2 .  CRANBERRY PO, Take 1 tablet by mouth 2 (two) times daily., Disp: , Rfl:  .  diltiazem (CARDIZEM) 30 MG tablet, Take 30 mg by mouth daily., Disp: , Rfl:  .  ELIQUIS 5 MG TABS tablet, TAKE 1 TABLET (5 MG TOTAL) BY MOUTH 2 (TWO) TIMES DAILY., Disp: 60 tablet, Rfl: 5 .  flecainide (TAMBOCOR) 150 MG tablet, TAKE 0.5 TABLETS (75 MG TOTAL) BY MOUTH 2 (TWO) TIMES DAILY., Disp: 90 tablet, Rfl: 3 .  furosemide (LASIX) 20 MG tablet, Take 1 tablet (20 mg total) by mouth daily as needed for fluid., Disp: 30 tablet, Rfl: 3 .  glipiZIDE (GLUCOTROL) 5 MG tablet, Take 1 tablet (5 mg total) by mouth 2 (  two) times daily., Disp: 180 tablet, Rfl: 0 .  ibuprofen (ADVIL,MOTRIN) 200 MG tablet, Take 600 mg by mouth daily as needed for moderate pain., Disp: , Rfl:  .  levothyroxine (SYNTHROID, LEVOTHROID) 112 MCG tablet, TAKE 1 TABLET BY MOUTH EVERY OTHER DAY, ALTERNATING WITH 1 & 1/2 TABLETS EVERY OTHER DAY, Disp: 45 tablet, Rfl: 2 .  losartan (COZAAR) 100 MG tablet, Take 1 tablet (100 mg total) by mouth daily. (Patient taking differently: Take 50 mg by mouth daily. ), Disp: 90 tablet, Rfl: 3 .  lovastatin (MEVACOR) 40 MG tablet, Take 1 tablet (40 mg total) by mouth every evening., Disp: 90 tablet, Rfl: 3 .  MATZIM LA 180 MG 24 hr tablet, TAKE ONE TABLET BY MOUTH ONE TIME DAILY, Disp: 60 tablet, Rfl: 11 .  metFORMIN (GLUCOPHAGE) 500 MG tablet, TAKE 2 TABLETS IN THE MORNING AND ONE TABLET IN THE EVENING, Disp: 270 tablet, Rfl: 3 .  metoprolol succinate (TOPROL-XL) 100 MG 24 hr tablet, Take 1 tablet (100 mg total) by mouth daily., Disp: 90 tablet, Rfl: 1 .  nystatin cream (MYCOSTATIN), Apply 1 application topically 2 (two) times daily., Disp: 30 g, Rfl: 1 .  ondansetron (ZOFRAN ODT) 4 MG disintegrating tablet, Take 1 tablet (4 mg total) by mouth every 8 (eight) hours as needed for nausea., Disp: 6  tablet, Rfl: 0 .  traMADol (ULTRAM) 50 MG tablet, Take 1 tablet (50 mg total) by mouth every 6 (six) hours as needed for moderate pain or severe pain., Disp: 30 tablet, Rfl: 0  Current Facility-Administered Medications:  .  cyanocobalamin ((VITAMIN B-12)) injection 1,000 mcg, 1,000 mcg, Intramuscular, Q30 days, Timmothy Euler, MD, 1,000 mcg at 08/17/17 1131  Allergies Quinapril hcl; Statins; and Tape  Review of Systems Review of Systems - Oncology ROS as per HPI otherwise 12 point ROS is negative.   Physical Exam  Vitals Wt Readings from Last 3 Encounters:  08/17/17 251 lb 9.6 oz (114.1 kg)  08/09/17 245 lb (111.1 kg)  07/27/17 244 lb 3.2 oz (110.8 kg)   Temp Readings from Last 3 Encounters:  08/17/17 98.3 F (36.8 C) (Oral)  08/12/17 98.2 F (36.8 C) (Oral)  08/09/17 97.6 F (36.4 C) (Oral)   BP Readings from Last 3 Encounters:  08/17/17 (!) 137/45  08/12/17 (!) 129/46  08/09/17 (!) 107/52   Pulse Readings from Last 3 Encounters:  08/17/17 70  08/12/17 64  08/09/17 (!) 58   Constitutional: Well-developed, well-nourished, and in no distress.   HENT: Head: Normocephalic and atraumatic.  Mouth/Throat: No oropharyngeal exudate. Mucosa moist. Eyes: Pupils are equal, round, and reactive to light. Conjunctivae are normal. No scleral icterus.  Neck: Normal range of motion. Neck supple. No JVD present.  Cardiovascular: Normal rate, regular rhythm and normal heart sounds.  Exam reveals no gallop and no friction rub.   No murmur heard. Pulmonary/Chest: Effort normal and breath sounds normal. No respiratory distress. No wheezes.No rales.  Abdominal: Soft. Bowel sounds are normal. No distension. There is no tenderness. There is no guarding.  Musculoskeletal: No edema or tenderness.  Lymphadenopathy: No cervical, axillary or supraclavicular adenopathy.  Neurological: Alert and oriented to person, place, and time. No cranial nerve deficit.  Skin: Skin is warm and dry. No  rash noted. No erythema. No pallor.  Psychiatric: Affect and judgment normal.   Labs Appointment on 08/17/2017  Component Date Value Ref Range Status  . Sodium 08/17/2017 134* 135 - 145 mmol/L Final  . Potassium 08/17/2017 4.0  3.5 -  5.1 mmol/L Final  . Chloride 08/17/2017 97* 101 - 111 mmol/L Final  . CO2 08/17/2017 25  22 - 32 mmol/L Final  . Glucose, Bld 08/17/2017 267* 65 - 99 mg/dL Final  . BUN 08/17/2017 19  6 - 20 mg/dL Final  . Creatinine, Ser 08/17/2017 0.64  0.44 - 1.00 mg/dL Final  . Calcium 08/17/2017 8.9  8.9 - 10.3 mg/dL Final  . GFR calc non Af Amer 08/17/2017 >60  >60 mL/min Final  . GFR calc Af Amer 08/17/2017 >60  >60 mL/min Final   Comment: (NOTE) The eGFR has been calculated using the CKD EPI equation. This calculation has not been validated in all clinical situations. eGFR's persistently <60 mL/min signify possible Chronic Kidney Disease.   Georgiann Hahn gap 08/17/2017 12  5 - 15 Final   Performed at [facility], [address], [place], [place] [address]  . WBC 08/17/2017 10.3  4.0 - 10.5 K/uL Final  . RBC 08/17/2017 2.54* 3.87 - 5.11 MIL/uL Final  . Hemoglobin 08/17/2017 8.0* 12.0 - 15.0 g/dL Final  . HCT 08/17/2017 25.7* 36.0 - 46.0 % Final  . MCV 08/17/2017 101.2* 78.0 - 100.0 fL Final  . MCH 08/17/2017 31.5  26.0 - 34.0 pg Final  . MCHC 08/17/2017 31.1  30.0 - 36.0 g/dL Final  . RDW 08/17/2017 20.8* 11.5 - 15.5 % Final  . Platelets 08/17/2017 246  150 - 400 K/uL Final  . Neutrophils Relative % 08/17/2017 79  % Final  . Neutro Abs 08/17/2017 8.1* 1.7 - 7.7 K/uL Final  . Lymphocytes Relative 08/17/2017 12  % Final  . Lymphs Abs 08/17/2017 1.3  0.7 - 4.0 K/uL Final  . Monocytes Relative 08/17/2017 7  % Final  . Monocytes Absolute 08/17/2017 0.8  0.1 - 1.0 K/uL Final  . Eosinophils Relative 08/17/2017 2  % Final  . Eosinophils Absolute 08/17/2017 0.2  0.0 - 0.7 K/uL Final  . Basophils Relative 08/17/2017 0  % Final  . Basophils Absolute 08/17/2017 0.0  0.0 -  0.1 K/uL Final   Performed at [facility], [address] [place],  [address]     Pathology Orders Placed This Encounter  Procedures  . CBC with Differential/Platelet    Standing Status:   Future    Standing Expiration Date:   08/18/2018  . Comprehensive metabolic panel    Standing Status:   Future    Standing Expiration Date:   08/18/2018  . Lactate dehydrogenase    Standing Status:   Future    Standing Expiration Date:   08/18/2018  . Protein electrophoresis, serum    Standing Status:   Future    Standing Expiration Date:   08/18/2018  . Ferritin    Standing Status:   Future    Standing Expiration Date:   08/18/2018  . Vitamin B12    Standing Status:   Future    Standing Expiration Date:   08/18/2018  . Folate    Standing Status:   Future    Standing Expiration Date:   08/18/2018  . Haptoglobin    Standing Status:   Future    Standing Expiration Date:   08/18/2018  . Methylmalonic acid, serum    Standing Status:   Future    Standing Expiration Date:   08/18/2018  . Direct antiglobulin test    Standing Status:   Future    Standing Expiration Date:   08/18/2018       Zoila Shutter MD

## 2017-08-17 NOTE — Progress Notes (Signed)
Pt given Cyanocobalamin inj Tolerated well 

## 2017-08-17 NOTE — Patient Instructions (Addendum)
[place] at [facility] Discharge Instructions  Today you saw Dr. Zoila Shutter  She reviewed your lab work with you today. Your hemoglobin is low and this can explain some of your symptoms.    Sometimes the quickest way to get your blood counts up is to do a blood transfusion when iron supplements do not raise your hemoglobin.  With your levels where they are, you are symptomatic and Dr. Walden Field recommends that you get a unit of blood infusion.  She discussed with you that there hasn't been enough time to raise your hemoglobin from your iron infusion last week; however, because you are symptomatic that is why she is urging you to get blood.  You just give [place] a call and let us know what you decide.  She will talk with Dr. Percival Spanish about your blood thinner to be sure that you aren't bleeding from that.    We will have you come back in 4-6 weeks with labs to follow up with our physician.    Thank you for choosing [place] at [facility] to provide your oncology and hematology care.  To afford each patient quality time with our provider, please arrive at least 15 minutes before your scheduled appointment time.   If you have a lab appointment with the [facility] please come in thru the  Main Entrance and check in at the main information desk  You need to re-schedule your appointment should you arrive 10 or more minutes late.  We strive to give you quality time with our providers, and arriving late affects you and other patients whose appointments are after yours.  Also, if you no show three or more times for appointments you may be dismissed from the clinic at the providers discretion.     Again, thank you for choosing [facility].  Our hope is that these requests will decrease the amount of time that you wait before being seen by our physicians.       _____________________________________________________________  Should you have  questions after your visit to [facility], please contact our office at [phone] between the hours of 8:30 a.m. and 4:30 p.m.  Voicemails left after 4:30 p.m. will not be returned until the following business day.  For prescription refill requests, have your pharmacy contact our office.       Resources For Cancer Patients and their Caregivers ? American Cancer Society: Can assist with transportation, wigs, general needs, runs Look Good Feel Better.        510-533-1607 ? Cancer Care: Provides financial assistance, online support groups, medication/co-pay assistance.  1-800-813-HOPE (386)241-8818) ? [place] Assists [place] cancer patients and their families through emotional , educational and financial support.  367-514-8681 ? [place] DSS Where to apply for food stamps, Medicaid and utility assistance. 2791532759 ? [employer]: Transportation to medical appointments. 623-048-8037 ? Social Security Administration: May apply for disability if have a Stage IV cancer. 434-417-7617 (437)867-6630 ? [employer], Disability and Transit Services: Assists with nutrition, care and transit needs. [place] Support Programs:   > Cancer Support Group  2nd Tuesday of the month 1pm-2pm, Journey Room   > Creative Journey  3rd Tuesday of the month 1130am-1pm, Journey Room

## 2017-08-18 LAB — VITAMIN B12

## 2017-08-19 ENCOUNTER — Other Ambulatory Visit: Payer: Self-pay | Admitting: *Deleted

## 2017-08-19 ENCOUNTER — Other Ambulatory Visit (HOSPITAL_COMMUNITY): Payer: Self-pay | Admitting: *Deleted

## 2017-08-19 ENCOUNTER — Telehealth (HOSPITAL_COMMUNITY): Payer: Self-pay

## 2017-08-19 ENCOUNTER — Inpatient Hospital Stay (HOSPITAL_COMMUNITY): Payer: Medicare Other

## 2017-08-19 DIAGNOSIS — D509 Iron deficiency anemia, unspecified: Secondary | ICD-10-CM

## 2017-08-19 DIAGNOSIS — K921 Melena: Secondary | ICD-10-CM | POA: Diagnosis not present

## 2017-08-19 DIAGNOSIS — E039 Hypothyroidism, unspecified: Secondary | ICD-10-CM | POA: Diagnosis not present

## 2017-08-19 DIAGNOSIS — R0602 Shortness of breath: Secondary | ICD-10-CM | POA: Diagnosis not present

## 2017-08-19 DIAGNOSIS — I10 Essential (primary) hypertension: Secondary | ICD-10-CM | POA: Diagnosis not present

## 2017-08-19 DIAGNOSIS — E538 Deficiency of other specified B group vitamins: Secondary | ICD-10-CM | POA: Diagnosis not present

## 2017-08-19 LAB — CBC WITH DIFFERENTIAL/PLATELET
BASOS ABS: 0 10*3/uL (ref 0.0–0.1)
BASOS PCT: 0 %
Eosinophils Absolute: 0.1 10*3/uL (ref 0.0–0.7)
Eosinophils Relative: 2 %
HEMATOCRIT: 26.2 % — AB (ref 36.0–46.0)
HEMOGLOBIN: 8 g/dL — AB (ref 12.0–15.0)
Lymphocytes Relative: 11 %
Lymphs Abs: 0.9 10*3/uL (ref 0.7–4.0)
MCH: 30.9 pg (ref 26.0–34.0)
MCHC: 30.5 g/dL (ref 30.0–36.0)
MCV: 101.2 fL — ABNORMAL HIGH (ref 78.0–100.0)
Monocytes Absolute: 0.7 10*3/uL (ref 0.1–1.0)
Monocytes Relative: 8 %
NEUTROS ABS: 6.9 10*3/uL (ref 1.7–7.7)
NEUTROS PCT: 79 %
Platelets: 249 10*3/uL (ref 150–400)
RBC: 2.59 MIL/uL — ABNORMAL LOW (ref 3.87–5.11)
RDW: 20.2 % — AB (ref 11.5–15.5)
WBC: 8.7 10*3/uL (ref 4.0–10.5)

## 2017-08-19 LAB — COMPREHENSIVE METABOLIC PANEL
ALK PHOS: 91 U/L (ref 38–126)
ALT: 20 U/L (ref 14–54)
AST: 31 U/L (ref 15–41)
Albumin: 3.8 g/dL (ref 3.5–5.0)
Anion gap: 13 (ref 5–15)
BILIRUBIN TOTAL: 1.2 mg/dL (ref 0.3–1.2)
BUN: 21 mg/dL — ABNORMAL HIGH (ref 6–20)
CO2: 25 mmol/L (ref 22–32)
Calcium: 8.6 mg/dL — ABNORMAL LOW (ref 8.9–10.3)
Chloride: 95 mmol/L — ABNORMAL LOW (ref 101–111)
Creatinine, Ser: 0.91 mg/dL (ref 0.44–1.00)
GFR calc Af Amer: >60 mL/min (ref >60)
GFR calc non Af Amer: >60 mL/min (ref >60)
Glucose, Bld: 284 mg/dL — ABNORMAL HIGH (ref 65–99)
POTASSIUM: 3.5 mmol/L (ref 3.5–5.1)
Sodium: 133 mmol/L — ABNORMAL LOW (ref 135–145)
TOTAL PROTEIN: 7 g/dL (ref 6.5–8.1)

## 2017-08-19 LAB — ABO/RH: ABO/RH(D): A NEG

## 2017-08-19 LAB — VITAMIN B12: Vitamin B-12: 721 pg/mL (ref 180–914)

## 2017-08-19 LAB — FOLATE: FOLATE: 30.9 ng/mL (ref >5.9)

## 2017-08-19 LAB — LACTATE DEHYDROGENASE: LDH: 139 U/L (ref 98–192)

## 2017-08-19 LAB — PREPARE RBC (CROSSMATCH)

## 2017-08-19 LAB — FERRITIN: Ferritin: 110 ng/mL (ref 11–307)

## 2017-08-19 MED ORDER — FUROSEMIDE 20 MG PO TABS: 20.0000 mg | ORAL_TABLET | Freq: Every day | ORAL | 0 refills | Status: DC | PRN

## 2017-08-19 NOTE — Telephone Encounter (Signed)
Patient called stating her SOB was worse and she was more tired and fatigued. She was offered the option of a blood transfusion at her appt earlier this week and now wants to do this. Reviewed with Dr. Walden Field who ordered 2 units to be transfused and she also wants labs drawn on patient. Orders entered and linked.

## 2017-08-20 ENCOUNTER — Other Ambulatory Visit: Payer: Self-pay

## 2017-08-20 ENCOUNTER — Encounter (HOSPITAL_COMMUNITY): Payer: Self-pay

## 2017-08-20 ENCOUNTER — Inpatient Hospital Stay (HOSPITAL_COMMUNITY): Payer: Medicare Other

## 2017-08-20 DIAGNOSIS — D509 Iron deficiency anemia, unspecified: Secondary | ICD-10-CM | POA: Diagnosis not present

## 2017-08-20 DIAGNOSIS — E039 Hypothyroidism, unspecified: Secondary | ICD-10-CM | POA: Diagnosis not present

## 2017-08-20 DIAGNOSIS — E538 Deficiency of other specified B group vitamins: Secondary | ICD-10-CM | POA: Diagnosis not present

## 2017-08-20 DIAGNOSIS — K921 Melena: Secondary | ICD-10-CM | POA: Diagnosis not present

## 2017-08-20 DIAGNOSIS — R0602 Shortness of breath: Secondary | ICD-10-CM | POA: Diagnosis not present

## 2017-08-20 DIAGNOSIS — I10 Essential (primary) hypertension: Secondary | ICD-10-CM | POA: Diagnosis not present

## 2017-08-20 LAB — PROTEIN ELECTROPHORESIS, SERUM
A/G Ratio: 1.2 (ref 0.7–1.7)
ALPHA-2-GLOBULIN: 0.7 g/dL (ref 0.4–1.0)
Albumin ELP: 3.6 g/dL (ref 2.9–4.4)
Alpha-1-Globulin: 0.1 g/dL (ref 0.0–0.4)
BETA GLOBULIN: 1.1 g/dL (ref 0.7–1.3)
Gamma Globulin: 1 g/dL (ref 0.4–1.8)
Globulin, Total: 3 g/dL (ref 2.2–3.9)
Total Protein ELP: 6.6 g/dL (ref 6.0–8.5)

## 2017-08-20 LAB — HAPTOGLOBIN: Haptoglobin: 80 mg/dL (ref 34–200)

## 2017-08-20 MED ORDER — SODIUM CHLORIDE 0.9 % IV SOLN
250.0000 mL | Freq: Once | INTRAVENOUS | Status: AC
  Administered 2017-08-20: 250 mL via INTRAVENOUS

## 2017-08-20 MED ORDER — ACETAMINOPHEN 325 MG PO TABS
650.0000 mg | ORAL_TABLET | Freq: Once | ORAL | Status: AC
  Administered 2017-08-20: 650 mg via ORAL
  Filled 2017-08-20: qty 2

## 2017-08-20 MED ORDER — DIPHENHYDRAMINE HCL 25 MG PO CAPS
25.0000 mg | ORAL_CAPSULE | Freq: Once | ORAL | Status: AC
  Administered 2017-08-20: 25 mg via ORAL
  Filled 2017-08-20: qty 1

## 2017-08-20 NOTE — Patient Instructions (Signed)
[place] at [facility] Discharge Instructions  2 units of blood given Follow up as scheduled.   Thank you for choosing [place] at [facility] to provide your oncology and hematology care.  To afford each patient quality time with our provider, please arrive at least 15 minutes before your scheduled appointment time.   If you have a lab appointment with the [facility] please come in thru the  Main Entrance and check in at the main information desk  You need to re-schedule your appointment should you arrive 10 or more minutes late.  We strive to give you quality time with our providers, and arriving late affects you and other patients whose appointments are after yours.  Also, if you no show three or more times for appointments you may be dismissed from the clinic at the providers discretion.     Again, thank you for choosing [facility].  Our hope is that these requests will decrease the amount of time that you wait before being seen by our physicians.       _____________________________________________________________  Should you have questions after your visit to [facility], please contact our office at [phone] between the hours of 8:30 a.m. and 4:30 p.m.  Voicemails left after 4:30 p.m. will not be returned until the following business day.  For prescription refill requests, have your pharmacy contact our office.       Resources For Cancer Patients and their Caregivers ? American Cancer Society: Can assist with transportation, wigs, general needs, runs Look Good Feel Better.        365-531-2452 ? Cancer Care: Provides financial assistance, online support groups, medication/co-pay assistance.  1-800-813-HOPE (534) 363-5397) ? [place] Assists [place] cancer patients and their families through emotional , educational and financial support.  501-467-9206 ? [place]  DSS Where to apply for food stamps, Medicaid and utility assistance. 201-001-0493 ? RCATS: Transportation to medical appointments. 913-881-2387 ? Social Security Administration: May apply for disability if have a Stage IV cancer. 859-513-6382 930-073-2277 ? [employer], Disability and Transit Services: Assists with nutrition, care and transit needs. [place] Support Programs:   > Cancer Support Group  2nd Tuesday of the month 1pm-2pm, Journey Room   > Creative Journey  3rd Tuesday of the month 1130am-1pm, Journey Room

## 2017-08-20 NOTE — Progress Notes (Signed)
2 units of blood given today per orders. Vitals stable and discharged home from clinic ambulatory. Follow up as scheduled

## 2017-08-21 LAB — BPAM RBC
Blood Product Expiration Date: 201904112359
Blood Product Expiration Date: 201904122359
ISSUE DATE / TIME: 201903221041
ISSUE DATE / TIME: 201903221253
Unit Type and Rh: 600
Unit Type and Rh: 600

## 2017-08-21 LAB — TYPE AND SCREEN
ABO/RH(D): A NEG
Antibody Screen: NEGATIVE
UNIT DIVISION: 0
Unit division: 0

## 2017-08-21 LAB — METHYLMALONIC ACID, SERUM: METHYLMALONIC ACID, QUANTITATIVE: 296 nmol/L (ref 0–378)

## 2017-08-23 ENCOUNTER — Telehealth: Payer: Self-pay | Admitting: Cardiology

## 2017-08-23 NOTE — Telephone Encounter (Signed)
New message    Patient calling to request order for labs

## 2017-08-23 NOTE — Telephone Encounter (Signed)
Returned the call to the patient. She stated that she would like to get her fasting lipid and hepatic labs drawn at her PCP tomorrow. Call has been placed to [place]. They have verified that she may get her labs drawn there tomorrow.

## 2017-08-23 NOTE — Telephone Encounter (Signed)
New Message;    Pt states she was told to keep track of her BP (100 something over 231) and had to get a blood transfusion and BP was tracked all day and states DR. Can go online and track the recordings and one reading was taken with a small cuff and 100 something over 60. Pt had a Echo and pt has not received any results.

## 2017-08-23 NOTE — Telephone Encounter (Signed)
New Message ° ° °Pt is returning phone call  °

## 2017-08-23 NOTE — Telephone Encounter (Signed)
Left a message to call back.

## 2017-08-24 ENCOUNTER — Other Ambulatory Visit: Payer: Medicare Other

## 2017-08-24 DIAGNOSIS — I10 Essential (primary) hypertension: Secondary | ICD-10-CM | POA: Diagnosis not present

## 2017-08-24 DIAGNOSIS — D509 Iron deficiency anemia, unspecified: Secondary | ICD-10-CM

## 2017-08-24 DIAGNOSIS — E785 Hyperlipidemia, unspecified: Secondary | ICD-10-CM | POA: Diagnosis not present

## 2017-08-25 ENCOUNTER — Telehealth: Payer: Self-pay | Admitting: *Deleted

## 2017-08-25 LAB — LIPID PANEL
CHOL/HDL RATIO: 3.7 ratio (ref 0.0–4.4)
Cholesterol, Total: 172 mg/dL (ref 100–199)
HDL: 47 mg/dL (ref >39)
LDL Calculated: 93 mg/dL (ref 0–99)
Triglycerides: 162 mg/dL — ABNORMAL HIGH (ref 0–149)
VLDL Cholesterol Cal: 32 mg/dL (ref 5–40)

## 2017-08-25 LAB — HEPATIC FUNCTION PANEL
ALBUMIN: 4.1 g/dL (ref 3.6–4.8)
ALK PHOS: 105 IU/L (ref 39–117)
ALT: 20 IU/L (ref 0–32)
AST: 32 IU/L (ref 0–40)
BILIRUBIN TOTAL: 0.7 mg/dL (ref 0.0–1.2)
BILIRUBIN, DIRECT: 0.24 mg/dL (ref 0.00–0.40)
TOTAL PROTEIN: 6.8 g/dL (ref 6.0–8.5)

## 2017-08-25 MED ORDER — EZETIMIBE 10 MG PO TABS: 10.0000 mg | ORAL_TABLET | Freq: Every day | ORAL | 3 refills | Status: DC

## 2017-08-25 NOTE — Telephone Encounter (Signed)
-----   Message from Minus Breeding, MD sent at 08/25/2017  9:08 AM EDT ----- LDL is not at target.  Would she consider starting Zetia 10 mg in addition to Mevacor since she is sensitive to other statins.  Call Ms. Hanning with the results and send results to Timmothy Euler, MD

## 2017-08-25 NOTE — Telephone Encounter (Signed)
Pt aware of her blood work, she is willing to try Zetia Rx has been sent to the pharmacy electronically. Zetia 10 mg #90 3 refills send into pt pharmacy

## 2017-08-29 ENCOUNTER — Other Ambulatory Visit: Payer: Self-pay | Admitting: Family Medicine

## 2017-08-30 ENCOUNTER — Telehealth: Payer: Self-pay | Admitting: Family Medicine

## 2017-08-30 NOTE — Telephone Encounter (Signed)
Patient is having capsule endoscopy.  Patient states that she will be n.p.o. and only clear liquids during that time.  She wonders if she should take all of her diabetic medications.  Recommended taking metformin and holding glipizide as this does have potential to give hypoglycemia.  No need to hold eliquis with capsule endoscopy.   Laroy Apple, MD [facility] 08/30/2017, 11:51 AM

## 2017-08-31 DIAGNOSIS — D500 Iron deficiency anemia secondary to blood loss (chronic): Secondary | ICD-10-CM | POA: Diagnosis not present

## 2017-08-31 NOTE — Telephone Encounter (Signed)
Prescription request complete

## 2017-09-04 ENCOUNTER — Other Ambulatory Visit: Payer: Self-pay

## 2017-09-04 ENCOUNTER — Emergency Department (HOSPITAL_COMMUNITY)
Admission: EM | Admit: 2017-09-04 | Discharge: 2017-09-04 | Disposition: A | Discharge status: Home / Self Care | Payer: Medicare Other | Attending: Emergency Medicine | Admitting: Emergency Medicine

## 2017-09-04 ENCOUNTER — Encounter (HOSPITAL_COMMUNITY): Payer: Self-pay

## 2017-09-04 DIAGNOSIS — Z7901 Long term (current) use of anticoagulants: Secondary | ICD-10-CM | POA: Diagnosis not present

## 2017-09-04 DIAGNOSIS — I480 Paroxysmal atrial fibrillation: Secondary | ICD-10-CM | POA: Diagnosis not present

## 2017-09-04 DIAGNOSIS — D6832 Hemorrhagic disorder due to extrinsic circulating anticoagulants: Secondary | ICD-10-CM | POA: Diagnosis not present

## 2017-09-04 DIAGNOSIS — Z7984 Long term (current) use of oral hypoglycemic drugs: Secondary | ICD-10-CM | POA: Insufficient documentation

## 2017-09-04 DIAGNOSIS — E119 Type 2 diabetes mellitus without complications: Secondary | ICD-10-CM | POA: Insufficient documentation

## 2017-09-04 DIAGNOSIS — E039 Hypothyroidism, unspecified: Secondary | ICD-10-CM | POA: Diagnosis not present

## 2017-09-04 DIAGNOSIS — I959 Hypotension, unspecified: Secondary | ICD-10-CM | POA: Diagnosis not present

## 2017-09-04 DIAGNOSIS — Z79899 Other long term (current) drug therapy: Secondary | ICD-10-CM | POA: Diagnosis not present

## 2017-09-04 DIAGNOSIS — I10 Essential (primary) hypertension: Secondary | ICD-10-CM | POA: Insufficient documentation

## 2017-09-04 DIAGNOSIS — I4891 Unspecified atrial fibrillation: Secondary | ICD-10-CM | POA: Diagnosis not present

## 2017-09-04 DIAGNOSIS — R000 Tachycardia, unspecified: Secondary | ICD-10-CM | POA: Diagnosis present

## 2017-09-04 LAB — CBC WITH DIFFERENTIAL/PLATELET
BASOS ABS: 0 10*3/uL (ref 0.0–0.1)
BASOS PCT: 0 %
Eosinophils Absolute: 0.1 10*3/uL (ref 0.0–0.7)
Eosinophils Relative: 1 %
HEMATOCRIT: 31.8 % — AB (ref 36.0–46.0)
HEMOGLOBIN: 10.1 g/dL — AB (ref 12.0–15.0)
Lymphocytes Relative: 13 %
Lymphs Abs: 0.9 10*3/uL (ref 0.7–4.0)
MCH: 30 pg (ref 26.0–34.0)
MCHC: 31.8 g/dL (ref 30.0–36.0)
MCV: 94.4 fL (ref 78.0–100.0)
Monocytes Absolute: 0.3 10*3/uL (ref 0.1–1.0)
Monocytes Relative: 5 %
NEUTROS ABS: 5.7 10*3/uL (ref 1.7–7.7)
NEUTROS PCT: 81 %
Platelets: 201 10*3/uL (ref 150–400)
RBC: 3.37 MIL/uL — ABNORMAL LOW (ref 3.87–5.11)
RDW: 16 % — AB (ref 11.5–15.5)
WBC: 7.1 10*3/uL (ref 4.0–10.5)

## 2017-09-04 LAB — BASIC METABOLIC PANEL
ANION GAP: 12 (ref 5–15)
BUN: 21 mg/dL — ABNORMAL HIGH (ref 6–20)
CALCIUM: 8.8 mg/dL — AB (ref 8.9–10.3)
CO2: 23 mmol/L (ref 22–32)
Chloride: 101 mmol/L (ref 101–111)
Creatinine, Ser: 0.64 mg/dL (ref 0.44–1.00)
Glucose, Bld: 293 mg/dL — ABNORMAL HIGH (ref 65–99)
Potassium: 3.4 mmol/L — ABNORMAL LOW (ref 3.5–5.1)
Sodium: 136 mmol/L (ref 135–145)

## 2017-09-04 MED ORDER — PROPOFOL 10 MG/ML IV BOLUS
0.5000 mg/kg | Freq: Once | INTRAVENOUS | Status: AC
  Administered 2017-09-04: 54.7 mg via INTRAVENOUS
  Filled 2017-09-04: qty 20

## 2017-09-04 NOTE — ED Triage Notes (Signed)
Pt coming from home due to sudden on set of afib with rvr. Pt rates are between 110-140 per ems. Pt took dilt 30mg  at home prior to ems arrival. Pt did miss morning dose yesterday. Pt does state she is sob.

## 2017-09-04 NOTE — Discharge Instructions (Signed)
Continue taking all of your current medications.

## 2017-09-04 NOTE — ED Notes (Signed)
PT states understanding of care given, follow up care. PT ambulated from ED to car with a steady gait.  

## 2017-09-04 NOTE — ED Notes (Signed)
Pt was cardioverted

## 2017-09-04 NOTE — ED Provider Notes (Signed)
[facility] EMERGENCY DEPARTMENT Provider Note   CSN: [id number] Arrival date & time: 09/04/17  0406     History   Chief Complaint Chief Complaint  Patient presents with  . afib with RVR    HPI Emily [name] is a 67 y.o. female.  The history is provided by the patient.  She has history of hypertension, diabetes, paroxysmal atrial fibrillation, obstructive sleep apnea and comes in because of recurrent atrial fibrillation.  She woke up at about 2 AM and noted that her heart was racing and states that this is the way she feels when she goes into atrial fibrillation.  She denies dyspnea or nausea but did have diaphoresis.  She took an extra dose of diltiazem at home without relief.  Nothing makes symptoms better, nothing makes them worse.  Past Medical History:  Diagnosis Date  . Anemia   . Arthritis   . Bilateral carotid artery disease ([place])   . Diabetes mellitus without complication ([place])   . Difficult intubation    states 'lady that did the sleep study told me I have the smallest airway she has ever seen in an adult"  . Dysrhythmia   . Heart murmur   . Hypertension   . Hypothyroid   . Obesity   . Obstructive sleep apnea    on C Pap  . PAF (paroxysmal atrial fibrillation) ([place])    a. newly dx in 09/2013; on eliquis  . UTI (urinary tract infection)     Patient Active Problem List   Diagnosis Date Noted  . Peripheral arterial disease ([place]) 07/06/2017  . Hyperlipidemia 06/29/2017  . Dyslipidemia 06/29/2017  . Iron deficiency anemia 05/27/2017  . Vitamin B12 deficiency 05/27/2017  . Pain in joint, ankle and foot 06/03/2016  . Carotid stenosis 08/30/2015  . Bilateral carotid artery disease ([place]) 07/05/2015  . Acute bronchitis 01/31/2015  . Diabetes mellitus without complication ([place])   . Diabetes ([place]) 10/27/2013  . Hypertension 10/27/2013  . Hypothyroidism 10/27/2013  . Atrial fibrillation, rapid ([place]) 10/27/2013    Past Surgical History:    Procedure Laterality Date  . CATARACT EXTRACTION Left   . CATARACT EXTRACTION W/PHACO Right 12/09/2015   Procedure: CATARACT EXTRACTION PHACO AND INTRAOCULAR LENS PLACEMENT (IOC);  Surgeon: Tonny Branch, MD;  Location: [facility];  Service: Ophthalmology;  Laterality: Right;  CDE:10.48  . COLONOSCOPY W/ POLYPECTOMY    . cyst on back of neck removed    . DILATION AND CURETTAGE OF UTERUS     x 5  . ENDARTERECTOMY Left 08/30/2015   Procedure: LEFT CAROYID ENDARTERECTOMY WITH XENOSURE BOVINE PERICARDIUM PATCH ANGIOPLASTY;  Surgeon: [name], MD;  Location: [facility];  Service: Vascular;  Laterality: Left;  . EYE SURGERY    . TONSILLECTOMY       OB History   None      Home Medications    Prior to Admission medications   Medication Sig Start Date End Date Taking? Authorizing Provider  amoxicillin-clavulanate (AUGMENTIN) 875-125 MG tablet Take 1 tablet by mouth 2 (two) times daily. 07/23/17   [provider]  chlorthalidone (HYGROTON) 25 MG tablet TAKE 1 TABLET (25 MG TOTAL) BY MOUTH DAILY. 08/02/17 10/31/17  [name], MD  CRANBERRY PO Take 1 tablet by mouth 2 (two) times daily.    [provider]  diltiazem (CARDIZEM) 30 MG tablet Take 30 mg by mouth daily.    [provider]  ELIQUIS 5 MG TABS tablet TAKE 1 TABLET (5 MG  TOTAL) BY MOUTH 2 (TWO) TIMES DAILY. 03/15/17   Minus Breeding, MD  ezetimibe (ZETIA) 10 MG tablet Take 1 tablet (10 mg total) by mouth daily. 08/25/17 11/23/17  Minus Breeding, MD  flecainide (TAMBOCOR) 150 MG tablet TAKE 0.5 TABLETS (75 MG TOTAL) BY MOUTH 2 (TWO) TIMES DAILY. 12/24/16   Minus Breeding, MD  furosemide (LASIX) 20 MG tablet Take 1 tablet (20 mg total) by mouth daily as needed for fluid. 08/19/17 06/08/20  Timmothy Euler, MD  glipiZIDE (GLUCOTROL) 5 MG tablet Take 1 tablet (5 mg total) by mouth 2 (two) times daily. 05/12/17   Timmothy Euler, MD  ibuprofen (ADVIL,MOTRIN) 200 MG tablet Take 600 mg by mouth daily as needed for  moderate pain.    [provider]  levothyroxine (SYNTHROID, LEVOTHROID) 112 MCG tablet TAKE 1 TABLET BY MOUTH EVERY OTHER DAY, ALTERNATING WITH 1 & 1/2 TABLETS EVERY OTHER DAY 08/30/17   Timmothy Euler, MD  losartan (COZAAR) 100 MG tablet Take 1 tablet (100 mg total) by mouth daily. Patient taking differently: Take 50 mg by mouth daily.  06/29/17   Minus Breeding, MD  lovastatin (MEVACOR) 40 MG tablet Take 1 tablet (40 mg total) by mouth every evening. 06/29/17   Minus Breeding, MD  MATZIM LA 180 MG 24 hr tablet TAKE ONE TABLET BY MOUTH ONE TIME DAILY 07/22/17   Minus Breeding, MD  metFORMIN (GLUCOPHAGE) 500 MG tablet TAKE 2 TABLETS IN THE MORNING AND ONE TABLET IN THE EVENING 06/22/17   Timmothy Euler, MD  metoprolol succinate (TOPROL-XL) 100 MG 24 hr tablet Take 1 tablet (100 mg total) by mouth daily. 05/12/17   Timmothy Euler, MD  nystatin cream (MYCOSTATIN) Apply 1 application topically 2 (two) times daily. 05/04/17   Timmothy Euler, MD  ondansetron (ZOFRAN ODT) 4 MG disintegrating tablet Take 1 tablet (4 mg total) by mouth every 8 (eight) hours as needed for nausea. 07/22/17   Tanna Furry, MD  traMADol (ULTRAM) 50 MG tablet Take 1 tablet (50 mg total) by mouth every 6 (six) hours as needed for moderate pain or severe pain. 02/08/17   Timmothy Euler, MD    Family History Family History  Problem Relation Age of Onset  . COPD Father   . Heart failure Father   . Heart disease Father   . Arrhythmia Sister   . Arrhythmia Sister   . Arrhythmia Sister        had PPM also  . Cancer Sister     Social History Social History   Tobacco Use  . Smoking status: Never Smoker  . Smokeless tobacco: Never Used  Substance Use Topics  . Alcohol use: No    Alcohol/week: 0.0 oz  . Drug use: No     Allergies   Quinapril hcl; Statins; and Tape   Review of Systems Review of Systems  All other systems reviewed and are negative.    Physical Exam Updated Vital  Signs BP (!) 137/92 (BP Location: Right Arm)   Pulse (!) 161   Temp (!) 97.3 F (36.3 C) (Oral)   Resp 13   Ht 5\' 2"  (1.575 m)   Wt 109.3 kg (241 lb)   SpO2 97%   BMI 44.08 kg/m   Physical Exam  Nursing note and vitals reviewed.  67 year old female, resting comfortably and in no acute distress. Vital signs are significant for elevated diastolic blood pressure and elevated heart rate. Oxygen saturation is 97%, which is normal.  Head is normocephalic and atraumatic. PERRLA, EOMI. Oropharynx is clear. Neck is nontender and supple without adenopathy or JVD. Back is nontender and there is no CVA tenderness. Lungs are clear without rales, wheezes, or rhonchi. Chest is nontender. Heart is tachycardic and irregular without murmur. Abdomen is soft, flat, nontender without masses or hepatosplenomegaly and peristalsis is normoactive. Extremities have trace edema, full range of motion is present. Skin is warm and dry without rash. Neurologic: Mental status is normal, cranial nerves are intact, there are no motor or sensory deficits.  ED Treatments / Results  Labs (all labs ordered are listed, but only abnormal results are displayed) Labs Reviewed - No data to display  EKG EKG Interpretation  Date/Time:  Saturday September 04 2017 04:14:10 EDT Ventricular Rate:  143 PR Interval:    QRS Duration: 108 QT Interval:  341 QTC Calculation: 526 R Axis:   -38 Text Interpretation:  Sinus or ectopic atrial tachycardia Paired ventricular premature complexes Left axis deviation Abnormal R-wave progression, late transition Repolarization abnormality, prob rate related Prolonged QT interval When compared with ECG of 07/22/2017, Atrial fibrillation with rapid ventricular response has replaced Sinus rhythm QT has lengthened REPOLARIZATION ABNORMALITY is now present - probably rate-related Confirmed by Delora Fuel ([id number]) on 09/04/2017 4:23:27 AM   Radiology No results  found.  Procedures .Sedation Date/Time: 09/04/2017 5:28 AM Performed by: Delora Fuel, MD Authorized by: Delora Fuel, MD   Consent:    Consent obtained:  Verbal   Consent given by:  Patient   Risks discussed:  Allergic reaction, dysrhythmia, inadequate sedation, nausea, prolonged hypoxia resulting in organ damage, prolonged sedation necessitating reversal, respiratory compromise necessitating ventilatory assistance and intubation and vomiting   Alternatives discussed:  Analgesia without sedation, anxiolysis and regional anesthesia Universal protocol:    Procedure explained and questions answered to patient or proxy's satisfaction: yes     Relevant documents present and verified: yes     Test results available and properly labeled: yes     Imaging studies available: yes     Required blood products, implants, devices, and special equipment available: yes     Site/side marked: yes     Immediately prior to procedure a time out was called: yes     Patient identity confirmation method:  Verbally with patient Indications:    Procedure necessitating sedation performed by:  Physician performing sedation   Intended level of sedation:  Deep Pre-sedation assessment:    Time since last food or drink:  6 hours   ASA classification: class 1 - normal, healthy patient     Neck mobility: normal     Mouth opening:  3 or more finger widths   Thyromental distance:  4 finger widths   Mallampati score:  I - soft palate, uvula, fauces, pillars visible   Pre-sedation assessments completed and reviewed: airway patency, cardiovascular function, hydration status, mental status, nausea/vomiting, pain level, respiratory function and temperature     Pre-sedation assessment completed:  09/04/2017 5:00 AM Immediate pre-procedure details:    Reassessment: Patient reassessed immediately prior to procedure     Reviewed: vital signs, relevant labs/tests and NPO status     Verified: bag valve mask available, emergency  equipment available, intubation equipment available, IV patency confirmed, oxygen available and suction available   Procedure details (see MAR for exact dosages):    Preoxygenation:  Nasal cannula   Sedation:  Propofol   Intra-procedure monitoring:  Blood pressure monitoring, cardiac monitor, continuous pulse oximetry, frequent LOC assessments, frequent vital sign  checks and continuous capnometry   Intra-procedure events: none     Total Provider sedation time (minutes):  30 Post-procedure details:    Post-sedation assessment completed:  09/04/2017 5:30 AM   Attendance: Constant attendance by certified staff until patient recovered     Recovery: Patient returned to pre-procedure baseline     Post-sedation assessments completed and reviewed: airway patency, cardiovascular function, hydration status, mental status, nausea/vomiting, pain level, respiratory function and temperature     Patient is stable for discharge or admission: yes     Patient tolerance:  Tolerated well, no immediate complications .Cardioversion Date/Time: 09/04/2017 5:30 AM Performed by: Delora Fuel, MD Authorized by: Delora Fuel, MD   Consent:    Consent obtained:  Written   Consent given by:  Patient   Risks discussed:  Cutaneous burn, death, induced arrhythmia and pain   Alternatives discussed:  Rate-control medication and alternative treatment Pre-procedure details:    Cardioversion basis:  Elective   Rhythm:  Atrial fibrillation   Electrode placement:  Anterior-posterior Patient sedated: Yes. Refer to sedation procedure documentation for details of sedation.  Attempt one:    Cardioversion mode:  Synchronous   Waveform:  Biphasic   Shock (Joules):  120   Shock outcome:  Conversion to normal sinus rhythm Post-procedure details:    Patient status:  Awake   Patient tolerance of procedure:  Tolerated well, no immediate complications   CRITICAL CARE Performed by: Delora Fuel Total critical care time: 35  minutes Critical care time was exclusive of separately billable procedures and treating other patients. Critical care was necessary to treat or prevent imminent or life-threatening deterioration. Critical care was time spent personally by me on the following activities: development of treatment plan with patient and/or surrogate as well as nursing, discussions with consultants, evaluation of patient's response to treatment, examination of patient, obtaining history from patient or surrogate, ordering and performing treatments and interventions, ordering and review of laboratory studies, ordering and review of radiographic studies, pulse oximetry and re-evaluation of patient's condition.  Medications Ordered in ED Medications  propofol (DIPRIVAN) 10 mg/mL bolus/IV push 54.7 mg (54.7 mg Intravenous Given 09/04/17 0521)     Initial Impression / Assessment and Plan / ED Course  I have reviewed the triage vital signs and the nursing notes.  Pertinent labs & imaging results that were available during my care of the patient were reviewed by me and considered in my medical decision making (see chart for details).  Atrial fibrillation with rapid ventricular response.  Old records are reviewed, and she does have prior ED visit for rapid atrial fibrillation with cardioversion.  I discussed with patient treatment alternatives including high-dose flecainide versus cardioversion.  Patient is elected to proceed with cardioversion.  She was sedated with propofol and successfully cardioverted with 1 shock at 120 J.  She will be observed in the ED to ensure stability of rhythm.  She has stayed in sinus rhythm and is completely recovered from sedation.  She is discharged with instructions to continue her current medications, follow-up with her cardiologist.  CHA2DS2/VAS Stroke Risk Points  Current as of 59 minutes ago     4 >= 2 Points: High Risk  1 - 1.99 Points: Medium Risk  0 Points: Low Risk    This is the  only CHA2DS2/VAS Stroke Risk Points available for the past  year.:  Last Change: N/A     Details    This score determines the patient's risk of having a stroke if the  patient has atrial fibrillation.       Points Metrics  0 Has Congestive Heart Failure:  No    Current as of 59 minutes ago  0 Has Vascular Disease:  No    Current as of 59 minutes ago  1 Has Hypertension:  Yes    Current as of 59 minutes ago  1 Age:  35    Current as of 59 minutes ago  1 Has Diabetes:  Yes    Current as of 59 minutes ago  0 Had Stroke:  No  Had TIA:  No  Had thromboembolism:  No    Current as of 59 minutes ago  1 Female:  Yes    Current as of 59 minutes ago     Final Clinical Impressions(s) / ED Diagnoses   Final diagnoses:  Paroxysmal atrial fibrillation with rapid ventricular response [facility])  Chronic anticoagulation    ED Discharge Orders    None       Delora Fuel, MD 29/56/21 [phone]

## 2017-09-06 ENCOUNTER — Telehealth: Payer: Self-pay | Admitting: Cardiology

## 2017-09-06 NOTE — Telephone Encounter (Signed)
She can see me at 7:40 on Friday morning.

## 2017-09-06 NOTE — Telephone Encounter (Signed)
New message  Pt verbalized that she is calling for RN  Delora Fuel  She had a cardioversion done 09/04/2017

## 2017-09-06 NOTE — Telephone Encounter (Signed)
Spoke with patient and she woke up Saturday morning and had to call 911 secondary to AFib with RVR. She had cardioversion in ED. She is in sinus rhythm now, at work and feeling good. She did miss all her morning medications Friday morning.  She was told to f/u with Dr Lolly Mustache but they did not say when Patient needs a Tuesday appointment. Will forward to Dr Percival Spanish to see when he would like for her to follow up.

## 2017-09-07 NOTE — Telephone Encounter (Signed)
Leave message for pt to call back 

## 2017-09-08 ENCOUNTER — Telehealth: Payer: Self-pay | Admitting: Cardiology

## 2017-09-08 NOTE — Telephone Encounter (Signed)
Returned the call to the patient. She stated that a message was left that she could have an appointment with Dr. Percival Spanish on Friday at 7:40. She stated that she is unable to come. She can only come on Tuesdays late afternoon or Wednesdays in [place]. Message routed to the provider's assistant.

## 2017-09-08 NOTE — Telephone Encounter (Signed)
New Message:     Pt is calling and states she can not do Friday at 7:40. Pt states she prefers Tuesday since she still works and after 11:00

## 2017-09-09 ENCOUNTER — Telehealth: Payer: Self-pay | Admitting: Cardiology

## 2017-09-09 NOTE — Telephone Encounter (Signed)
New message    Patient calling again about getting a follow up appt with Hochrein from her ablation.  I offered her the times available and with PA, she refused would like to speak with nurse again.

## 2017-09-09 NOTE — Telephone Encounter (Signed)
appt schedule May 7th @ 1:20

## 2017-09-10 ENCOUNTER — Encounter: Payer: Self-pay | Admitting: Family Medicine

## 2017-09-10 ENCOUNTER — Ambulatory Visit (INDEPENDENT_AMBULATORY_CARE_PROVIDER_SITE_OTHER): Payer: Medicare Other | Admitting: Family Medicine

## 2017-09-10 ENCOUNTER — Other Ambulatory Visit (HOSPITAL_COMMUNITY): Payer: Self-pay | Admitting: Cardiology

## 2017-09-10 VITALS — BP 126/59 | HR 60 | Temp 97.4°F | Ht 62.0 in | Wt 243.0 lb

## 2017-09-10 DIAGNOSIS — N3001 Acute cystitis with hematuria: Secondary | ICD-10-CM

## 2017-09-10 DIAGNOSIS — R300 Dysuria: Secondary | ICD-10-CM | POA: Diagnosis not present

## 2017-09-10 DIAGNOSIS — I6523 Occlusion and stenosis of bilateral carotid arteries: Secondary | ICD-10-CM | POA: Diagnosis not present

## 2017-09-10 LAB — URINALYSIS, COMPLETE
Bilirubin, UA: NEGATIVE
Nitrite, UA: POSITIVE — AB
PH UA: 6 (ref 5.0–7.5)
Specific Gravity, UA: >1.03 — ABNORMAL HIGH (ref 1.005–1.030)
Urobilinogen, Ur: 1 mg/dL (ref 0.2–1.0)

## 2017-09-10 LAB — MICROSCOPIC EXAMINATION
RENAL EPITHEL UA: NONE SEEN /HPF
WBC, UA: >30 /hpf — AB (ref 0–5)

## 2017-09-10 MED ORDER — SULFAMETHOXAZOLE-TRIMETHOPRIM 800-160 MG PO TABS: 1.0000 | ORAL_TABLET | Freq: Two times a day (BID) | ORAL | 0 refills | Status: DC

## 2017-09-10 NOTE — Patient Instructions (Signed)

## 2017-09-10 NOTE — Progress Notes (Signed)
   HPI  Patient presents today here for concern for UTI.  Patient states she has had a few days of difficulty starting to urinate and some discomfort.  She has had recent history of frequent UTI.  She has followed up with urology previously for frequent UTI. No fever, chills, sweats. She is tolerating food and fluids like usual  PMH: Smoking status noted ROS: Per HPI  Objective: BP (!) 126/59 (BP Location: Left Arm)   Pulse 60   Temp (!) 97.4 F (36.3 C) (Oral)   Ht 5\' 2"  (1.575 m)   Wt 243 lb (110.2 kg)   BMI 44.45 kg/m  Gen: NAD, alert, cooperative with exam HEENT: NCAT CV: RRR, good T4/H9, 6-2/2 systolic murmur @ RSB Resp: CTABL, no wheezes, non-labored Abd: No CVA tenderness, no Suprapubic tenderness Ext: No edema, warm Neuro: Alert and oriented, No gross deficits  Assessment and plan:  # UTI Tx with bactrim per previous cultures Culture No signs of Pyelo   Orders Placed This Encounter  Procedures  . Urine Culture  . Urinalysis, Complete    Meds ordered this encounter  Medications  . sulfamethoxazole-trimethoprim (BACTRIM DS) 800-160 MG tablet    Sig: Take 1 tablet by mouth 2 (two) times daily.    Dispense:  14 tablet    Refill:  0    Laroy Apple, MD [facility] 09/10/2017, 1:31 PM

## 2017-09-12 LAB — URINE CULTURE

## 2017-09-14 ENCOUNTER — Telehealth: Payer: Self-pay | Admitting: Cardiology

## 2017-09-14 NOTE — Telephone Encounter (Signed)
   Left a message on Emily [name]'s home and cell phones regarding Dr Hochrein's recommendations.  Recommended she defer the procedure for now and discuss it with him at the f/u appt. On May 7.  [name], PA-C 09/14/2017 6:53 PM Beeper [phone]

## 2017-09-14 NOTE — Telephone Encounter (Signed)
   [facility] Pre-operative Risk Assessment    Request for surgical clearance:  1. What type of surgery is being performed? EGD @ [facility] for symptoms/diagnosis of IDA  2. When is this surgery scheduled? September 16, 2017   3. What type of clearance is required (medical clearance vs. Pharmacy clearance to hold med vs. Both)? Both   4. Are there any medications that need to be held prior to surgery and how long? Eliquis - 2 days prior   5. Practice name and name of physician performing surgery? Dr. Cristela Blue @ [facility]   6. What is your office phone number [phone])    7.   What is your office fax number [phone])  8.   Anesthesia type (None, local, MAC, general) ? Not specified    Fidel Levy 09/14/2017, 7:31 AM  _________________________________________________________________   (provider comments below)

## 2017-09-14 NOTE — Telephone Encounter (Signed)
Follow up   Emily [name] at [facility] is calling to check on the clearance for the pt to stop Eliquis today for her surgery due ton bleeding pollup on 09/16/17. Please call

## 2017-09-14 NOTE — Telephone Encounter (Signed)
   Primary Cardiologist: Minus Breeding, MD  Chart reviewed as part of pre-operative protocol coverage. Patient was contacted 09/14/2017 in reference to pre-operative risk assessment for pending surgery as outlined below.  Olive Massing was last seen on 06/29/2017 by Dr. Percival Spanish.  Since that day, Katrianna Friesenhahn has been evaluated for anemia that has required transfusions.  The evaluation started back in March.  She ended up being referred to GI and needs an endoscopy.  They are requesting she hold the Eliquis for 48 hours prior to the endoscopy.  However, on 09/04/2017, she had recurrence of atrial fibrillation and required a cardioversion.  I was able to speak with Dr. Percival Spanish by phone.  He feels that because she was recently cardioverted, her stroke risk is much higher than it would normally be.  Therefore, he recommends against coming off the Eliquis for the endoscopy.  His preference is that she keep the appointment with him that she has in May, and at that time he can review with her when it would be acceptable for her to come off the Eliquis for the procedure.  Therefore, based on ACC/AHA guidelines, the patient would be at unacceptable risk for the planned procedure without further cardiovascular testing.   I will route this recommendation to the requesting party via Epic fax function and remove from pre-op pool.  Please call with questions.  [name], PA-C 09/14/2017, 5:12 PM

## 2017-09-15 NOTE — Telephone Encounter (Signed)
No additional recommendation at this time until procedure okay by cardiologist.  Harrington Challenger PharmD, BCPS, [place] [address] 09/15/2017 7:18 AM

## 2017-09-16 ENCOUNTER — Telehealth: Payer: Self-pay

## 2017-09-16 NOTE — Telephone Encounter (Signed)
   [facility] Pre-operative Risk Assessment    Request for surgical clearance:  1. What type of surgery is being performed? endoscopy  2. When is this surgery scheduled? TBD  3. What type of clearance is required (medical clearance vs. Pharmacy clearance to hold med vs. Both)? Pharmacy  4. Are there any medications that need to be held prior to surgery and how long? Eliquis ok to hold 48 hrs prior   5. Practice name and name of physician performing surgery? [facility]   Dr.Christopher Connolley  6. What is your office phone number [phone]   7.   What is your office fax number [phone]  8.   Anesthesia type (None, local, MAC, general) ? unknown   Kathyrn Lass 09/16/2017, 2:43 PM  _________________________________________________________________   (provider comments below)

## 2017-09-16 NOTE — Telephone Encounter (Signed)
  Chart reviewed. This is the 2nd encounter for clearance request for EGD. Pt has been advised to follow-up with Dr. Percival Spanish to determine clearance and procedure risk. Appt is scheduled for 10/05/17. Clearance pending.

## 2017-09-21 ENCOUNTER — Ambulatory Visit (INDEPENDENT_AMBULATORY_CARE_PROVIDER_SITE_OTHER): Payer: Medicare Other | Admitting: *Deleted

## 2017-09-21 DIAGNOSIS — E538 Deficiency of other specified B group vitamins: Secondary | ICD-10-CM | POA: Diagnosis not present

## 2017-09-21 NOTE — Progress Notes (Signed)
Pt given Cyanocobalamin inj Tolerated well 

## 2017-09-25 ENCOUNTER — Other Ambulatory Visit: Payer: Self-pay | Admitting: Family Medicine

## 2017-09-27 ENCOUNTER — Other Ambulatory Visit (HOSPITAL_COMMUNITY): Payer: Self-pay | Admitting: *Deleted

## 2017-09-27 ENCOUNTER — Other Ambulatory Visit (HOSPITAL_COMMUNITY): Payer: Medicare Other

## 2017-09-27 ENCOUNTER — Ambulatory Visit (HOSPITAL_COMMUNITY): Payer: Medicare Other | Admitting: Internal Medicine

## 2017-09-27 DIAGNOSIS — D509 Iron deficiency anemia, unspecified: Secondary | ICD-10-CM

## 2017-09-28 ENCOUNTER — Encounter (HOSPITAL_COMMUNITY): Payer: Self-pay | Admitting: Internal Medicine

## 2017-09-28 ENCOUNTER — Inpatient Hospital Stay (HOSPITAL_COMMUNITY): Payer: Medicare Other | Attending: Hematology

## 2017-09-28 ENCOUNTER — Inpatient Hospital Stay (HOSPITAL_BASED_OUTPATIENT_CLINIC_OR_DEPARTMENT_OTHER): Payer: Medicare Other | Admitting: Internal Medicine

## 2017-09-28 ENCOUNTER — Other Ambulatory Visit: Payer: Self-pay

## 2017-09-28 VITALS — BP 132/48 | HR 53 | Temp 98.2°F | Resp 18 | Wt 247.1 lb

## 2017-09-28 DIAGNOSIS — E039 Hypothyroidism, unspecified: Secondary | ICD-10-CM

## 2017-09-28 DIAGNOSIS — E538 Deficiency of other specified B group vitamins: Secondary | ICD-10-CM

## 2017-09-28 DIAGNOSIS — D649 Anemia, unspecified: Secondary | ICD-10-CM | POA: Diagnosis not present

## 2017-09-28 DIAGNOSIS — I10 Essential (primary) hypertension: Secondary | ICD-10-CM | POA: Diagnosis not present

## 2017-09-28 DIAGNOSIS — K921 Melena: Secondary | ICD-10-CM | POA: Insufficient documentation

## 2017-09-28 DIAGNOSIS — D508 Other iron deficiency anemias: Secondary | ICD-10-CM

## 2017-09-28 DIAGNOSIS — Z7901 Long term (current) use of anticoagulants: Secondary | ICD-10-CM | POA: Insufficient documentation

## 2017-09-28 DIAGNOSIS — D509 Iron deficiency anemia, unspecified: Secondary | ICD-10-CM

## 2017-09-28 LAB — IRON AND TIBC
Iron: 48 ug/dL (ref 28–170)
Saturation Ratios: 14 % (ref 10.4–31.8)
TIBC: 342 ug/dL (ref 250–450)
UIBC: 294 ug/dL

## 2017-09-28 LAB — FOLATE: FOLATE: 27.5 ng/mL (ref >5.9)

## 2017-09-28 LAB — COMPREHENSIVE METABOLIC PANEL
ALK PHOS: 103 U/L (ref 38–126)
ALT: 22 U/L (ref 14–54)
ANION GAP: 12 (ref 5–15)
AST: 31 U/L (ref 15–41)
Albumin: 4 g/dL (ref 3.5–5.0)
BILIRUBIN TOTAL: 0.7 mg/dL (ref 0.3–1.2)
BUN: 20 mg/dL (ref 6–20)
CALCIUM: 9.2 mg/dL (ref 8.9–10.3)
CO2: 26 mmol/L (ref 22–32)
Chloride: 97 mmol/L — ABNORMAL LOW (ref 101–111)
Creatinine, Ser: 0.63 mg/dL (ref 0.44–1.00)
GFR calc Af Amer: >60 mL/min (ref >60)
GLUCOSE: 275 mg/dL — AB (ref 65–99)
Potassium: 4.2 mmol/L (ref 3.5–5.1)
Sodium: 135 mmol/L (ref 135–145)
TOTAL PROTEIN: 7.5 g/dL (ref 6.5–8.1)

## 2017-09-28 LAB — CBC WITH DIFFERENTIAL/PLATELET
Basophils Absolute: 0 10*3/uL (ref 0.0–0.1)
Basophils Relative: 1 %
Eosinophils Absolute: 0.2 10*3/uL (ref 0.0–0.7)
Eosinophils Relative: 3 %
HEMATOCRIT: 33.8 % — AB (ref 36.0–46.0)
HEMOGLOBIN: 10.6 g/dL — AB (ref 12.0–15.0)
LYMPHS PCT: 11 %
Lymphs Abs: 0.9 10*3/uL (ref 0.7–4.0)
MCH: 29.1 pg (ref 26.0–34.0)
MCHC: 31.4 g/dL (ref 30.0–36.0)
MCV: 92.9 fL (ref 78.0–100.0)
MONO ABS: 0.5 10*3/uL (ref 0.1–1.0)
MONOS PCT: 6 %
NEUTROS PCT: 79 %
Neutro Abs: 6.2 10*3/uL (ref 1.7–7.7)
Platelets: 202 10*3/uL (ref 150–400)
RBC: 3.64 MIL/uL — ABNORMAL LOW (ref 3.87–5.11)
RDW: 15.4 % (ref 11.5–15.5)
WBC: 7.9 10*3/uL (ref 4.0–10.5)

## 2017-09-28 LAB — VITAMIN B12: VITAMIN B 12: 485 pg/mL (ref 180–914)

## 2017-09-28 LAB — FERRITIN: Ferritin: 58 ng/mL (ref 11–307)

## 2017-09-28 LAB — LACTATE DEHYDROGENASE: LDH: 130 U/L (ref 98–192)

## 2017-09-28 NOTE — Progress Notes (Signed)
Diagnosis Other iron deficiency anemia - Plan: CBC with Differential/Platelet, Comprehensive metabolic panel, Lactate dehydrogenase, Ferritin  Staging Cancer Staging No matching staging information was found for the patient.  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 Talbert Cage and has also seen nurse practitioner Renato Battles.  She was reportedly diagnosed with iron deficiency anemia with a ferritin of 9.  Se has received IV Feraheme most recently August 12, 2017.  Labs performed 08/17/2017 showed a white count 10.3 hemoglobin 8 platelets 246,000 creatinine is 0.64.  Ferritin level in February 2019 was 34.    Due to the significant decrease in HB, she was recommended for blood transfusion that was done 07/2017.  She is symptomatically improved since transfusion.  HB is 10.6 on labs done today 09/28/2017.   She will RTC in 3 months for follow-up and repeat labs.    She has undergone GI evaluation due to dark stools and reports she was told she had a oozing polyp.  She will follow-up with GI for further evaluation once she is off Eliquis. She has been told in the past  Eliquis has potential bleeding risks associated with the medication.  She reports cardiology desire for her to stay on the medication for at least another month due to recent ablation.    Labs done in 07/2017 showed a ferritin 110, normal SPEP, haptoglobin of 80 which was WNL, Normal B12 and MMA,  LDH.  Pt has reported history of B12 deficiency.  She denies any gastric bypass surgery.    2.  Hypertension.  BP is 132/48.  Followup with PCP.    3.  Dark stools.  Pt reports she is on Eliquis.  She has undergone  GI evaluation and reports she was told she had a oozing polyp.  She will follow-up with GI for further evaluation once she is off Eliquis. She has been told in the past  Eliquis has potential bleeding risks associated with the medication.  She reports cardiology desire for her to stay on the medication  for at least another month due to recent ablation.    4.  Hypothyroidism.  Continue monitoring through PCP.  5.  B12 deficiency.  She reports she is currently on vitamin B12.  MMA and B12 levels in 07/2017 were WNL.    6.  SOB.  Symptoms have improved after transfusion.    Interval History:   67 y.o. Female with history of chronic normocytic anemia.  Patient had blood work done on 05/04/2017 which demonstrated WBC 9K, hemoglobin 9.5 g/dL, hematocrit 30.5%, MCV 92, platelet count 280 K.  Hemoglobin on 02/16/2017 was 11.8 g/dL.  Hemoglobin from 1 year ago on 12/04/15 was 9.5 g/dL.  Patient states that she feels chronically fatigued.  She states she had a EGD and colonoscopy performed in [place] 2 years ago and they were both normal.  She states that she has been taking slow release oral iron tablet daily for the past 1 year without major improvement in her anemia.    Current Status: Patient is seen today for follow-up.  She reports energy level is improved.  She has been seen by GI and was found to have a oozing polyp.   Problem List Patient Active Problem List   Diagnosis Date Noted  . Peripheral arterial disease ([place]) [I73.9] 07/06/2017  . Hyperlipidemia [E78.5] 06/29/2017  . Dyslipidemia [E78.5] 06/29/2017  . Iron deficiency anemia [D50.9] 05/27/2017  . Vitamin B12 deficiency [E53.8] 05/27/2017  . Pain in  joint, ankle and foot [M25.579] 06/03/2016  . Carotid stenosis [I65.29] 08/30/2015  . Bilateral carotid artery disease ([place]) [I77.9] 07/05/2015  . Acute bronchitis [J20.9] 01/31/2015  . Diabetes mellitus without complication ([place]) [Y24.8]   . Diabetes ([place]) [E11.9] 10/27/2013  . Hypertension [I10] 10/27/2013  . Hypothyroidism [E03.9] 10/27/2013  . Atrial fibrillation, rapid [place]) [I48.91] 10/27/2013    Past Medical History Past Medical History:  Diagnosis Date  . Anemia   . Arthritis   . Bilateral carotid artery disease ([place])   . Diabetes mellitus without complication ([place])    . Difficult intubation    states 'lady that did the sleep study told me I have the smallest airway she has ever seen in an adult"  . Dysrhythmia   . Heart murmur   . Hypertension   . Hypothyroid   . Obesity   . Obstructive sleep apnea    on C Pap  . PAF (paroxysmal atrial fibrillation) ([place])    a. newly dx in 09/2013; on eliquis  . UTI (urinary tract infection)     Past Surgical History Past Surgical History:  Procedure Laterality Date  . CATARACT EXTRACTION Left   . CATARACT EXTRACTION W/PHACO Right 12/09/2015   Procedure: CATARACT EXTRACTION PHACO AND INTRAOCULAR LENS PLACEMENT ([facility]);  Surgeon: Tonny Branch, MD;  Location: [facility];  Service: Ophthalmology;  Laterality: Right;  CDE:10.48  . COLONOSCOPY W/ POLYPECTOMY    . cyst on back of neck removed    . DILATION AND CURETTAGE OF UTERUS     x 5  . ENDARTERECTOMY Left 08/30/2015   Procedure: LEFT CAROYID ENDARTERECTOMY WITH XENOSURE BOVINE PERICARDIUM PATCH ANGIOPLASTY;  Surgeon: [name], MD;  Location: [facility];  Service: Vascular;  Laterality: Left;  . EYE SURGERY    . TONSILLECTOMY      Family History Family History  Problem Relation Age of Onset  . COPD Father   . Heart failure Father   . Heart disease Father   . Arrhythmia Sister   . Arrhythmia Sister   . Arrhythmia Sister        had PPM also  . Cancer Sister      Social History  reports that she has never smoked. She has never used smokeless tobacco. She reports that she does not drink alcohol or use drugs.  Medications  Current Outpatient Medications:  .  chlorthalidone (HYGROTON) 25 MG tablet, TAKE 1 TABLET (25 MG TOTAL) BY MOUTH DAILY., Disp: 90 tablet, Rfl: 2 .  CRANBERRY PO, Take 1 tablet by mouth 2 (two) times daily., Disp: , Rfl:  .  diltiazem (CARDIZEM) 30 MG tablet, Take 1 tablet (30 mg total) by mouth daily., Disp: 30 tablet, Rfl: 3 .  ELIQUIS 5 MG TABS tablet, TAKE 1 TABLET (5 MG TOTAL) BY MOUTH 2 (TWO) TIMES DAILY., Disp: 60 tablet, Rfl: 5 .   ezetimibe (ZETIA) 10 MG tablet, Take 1 tablet (10 mg total) by mouth daily., Disp: 90 tablet, Rfl: 3 .  ferrous sulfate (SLOW FE) 160 (50 Fe) MG TBCR SR tablet, Take by mouth., Disp: , Rfl:  .  flecainide (TAMBOCOR) 150 MG tablet, TAKE 0.5 TABLETS (75 MG TOTAL) BY MOUTH 2 (TWO) TIMES DAILY., Disp: 90 tablet, Rfl: 3 .  furosemide (LASIX) 20 MG tablet, Take 1 tablet (20 mg total) by mouth daily as needed for fluid., Disp: 90 tablet, Rfl: 0 .  glipiZIDE (GLUCOTROL) 5 MG tablet, Take 1 tablet (5 mg total) by mouth 2 (two) times daily., Disp: 180  tablet, Rfl: 0 .  ibuprofen (ADVIL,MOTRIN) 200 MG tablet, Take by mouth., Disp: , Rfl:  .  levothyroxine (SYNTHROID, LEVOTHROID) 112 MCG tablet, TAKE 1 TABLET BY MOUTH EVERY OTHER DAY, ALTERNATING WITH 1 & 1/2 TABLETS EVERY OTHER DAY, Disp: 45 tablet, Rfl: 0 .  losartan (COZAAR) 100 MG tablet, Take 1 tablet (100 mg total) by mouth daily. (Patient taking differently: Take 50 mg by mouth daily. ), Disp: 90 tablet, Rfl: 3 .  lovastatin (MEVACOR) 40 MG tablet, Take 1 tablet (40 mg total) by mouth every evening., Disp: 90 tablet, Rfl: 3 .  MATZIM LA 180 MG 24 hr tablet, TAKE ONE TABLET BY MOUTH ONE TIME DAILY, Disp: 60 tablet, Rfl: 11 .  metFORMIN (GLUCOPHAGE) 500 MG tablet, TAKE 2 TABLETS IN THE MORNING AND ONE TABLET IN THE EVENING, Disp: 270 tablet, Rfl: 3 .  metoprolol succinate (TOPROL-XL) 100 MG 24 hr tablet, Take 1 tablet (100 mg total) by mouth daily., Disp: 90 tablet, Rfl: 1 .  pantoprazole (PROTONIX) 40 MG tablet, TAKE ONE TABLET (40 MG DOSE) BY MOUTH 30 (THIRTY) MINUTES BEFORE BREAKFAST., Disp: , Rfl: 5 .  traMADol (ULTRAM) 50 MG tablet, Take 1 tablet (50 mg total) by mouth every 6 (six) hours as needed for moderate pain or severe pain., Disp: 30 tablet, Rfl: 0  Current Facility-Administered Medications:  .  cyanocobalamin ((VITAMIN B-12)) injection 1,000 mcg, 1,000 mcg, Intramuscular, Q30 days, Timmothy Euler, MD, 1,000 mcg at 09/21/17  1058  Allergies Quinapril hcl; Statins; and Tape  Review of Systems Review of Systems - Oncology ROS as per HPI otherwise 12 point ROS is negative.   Physical Exam  Vitals Wt Readings from Last 3 Encounters:  09/28/17 247 lb 1.6 oz (112.1 kg)  09/10/17 243 lb (110.2 kg)  09/04/17 241 lb (109.3 kg)   Temp Readings from Last 3 Encounters:  09/28/17 98.2 F (36.8 C) (Oral)  09/10/17 (!) 97.4 F (36.3 C) (Oral)  09/04/17 (!) 97.3 F (36.3 C) (Oral)   BP Readings from Last 3 Encounters:  09/28/17 (!) 132/48  09/10/17 (!) 126/59  09/04/17 (!) 126/57   Pulse Readings from Last 3 Encounters:  09/28/17 (!) 53  09/10/17 60  09/04/17 71   Constitutional: Well-developed, well-nourished, and in no distress.   HENT: Head: Normocephalic and atraumatic.  Mouth/Throat: No oropharyngeal exudate. Mucosa moist. Eyes: Pupils are equal, round, and reactive to light. Conjunctivae are normal. No scleral icterus.  Neck: Normal range of motion. Neck supple. No JVD present.  Cardiovascular: Normal rate, regular rhythm and normal heart sounds.  Exam reveals no gallop and no friction rub.   No murmur heard. Pulmonary/Chest: Effort normal and breath sounds normal. No respiratory distress. No wheezes.No rales.  Abdominal: Soft. Bowel sounds are normal. No distension. There is no tenderness. There is no guarding.  Musculoskeletal: No edema or tenderness.  Lymphadenopathy: No cervical, axillary or supraclavicular adenopathy.  Neurological: Alert and oriented to person, place, and time. No cranial nerve deficit.  Skin: Skin is warm and dry. No rash noted. No erythema. No pallor.  Psychiatric: Affect and judgment normal.   Labs Appointment on 09/28/2017  Component Date Value Ref Range Status  . WBC 09/28/2017 7.9  4.0 - 10.5 K/uL Final  . RBC 09/28/2017 3.64* 3.87 - 5.11 MIL/uL Final  . Hemoglobin 09/28/2017 10.6* 12.0 - 15.0 g/dL Final  . HCT 09/28/2017 33.8* 36.0 - 46.0 % Final  . MCV  09/28/2017 92.9  78.0 - 100.0 fL Final  .  MCH 09/28/2017 29.1  26.0 - 34.0 pg Final  . MCHC 09/28/2017 31.4  30.0 - 36.0 g/dL Final  . RDW 09/28/2017 15.4  11.5 - 15.5 % Final  . Platelets 09/28/2017 202  150 - 400 K/uL Final  . Neutrophils Relative % 09/28/2017 79  % Final  . Neutro Abs 09/28/2017 6.2  1.7 - 7.7 K/uL Final  . Lymphocytes Relative 09/28/2017 11  % Final  . Lymphs Abs 09/28/2017 0.9  0.7 - 4.0 K/uL Final  . Monocytes Relative 09/28/2017 6  % Final  . Monocytes Absolute 09/28/2017 0.5  0.1 - 1.0 K/uL Final  . Eosinophils Relative 09/28/2017 3  % Final  . Eosinophils Absolute 09/28/2017 0.2  0.0 - 0.7 K/uL Final  . Basophils Relative 09/28/2017 1  % Final  . Basophils Absolute 09/28/2017 0.0  0.0 - 0.1 K/uL Final   Performed at [facility], [address], [place], [place] [address]  . Sodium 09/28/2017 135  135 - 145 mmol/L Final  . Potassium 09/28/2017 4.2  3.5 - 5.1 mmol/L Final  . Chloride 09/28/2017 97* 101 - 111 mmol/L Final  . CO2 09/28/2017 26  22 - 32 mmol/L Final  . Glucose, Bld 09/28/2017 275* 65 - 99 mg/dL Final  . BUN 09/28/2017 20  6 - 20 mg/dL Final  . Creatinine, Ser 09/28/2017 0.63  0.44 - 1.00 mg/dL Final  . Calcium 09/28/2017 9.2  8.9 - 10.3 mg/dL Final  . Total Protein 09/28/2017 7.5  6.5 - 8.1 g/dL Final  . Albumin 09/28/2017 4.0  3.5 - 5.0 g/dL Final  . AST 09/28/2017 31  15 - 41 U/L Final  . ALT 09/28/2017 22  14 - 54 U/L Final  . Alkaline Phosphatase 09/28/2017 103  38 - 126 U/L Final  . Total Bilirubin 09/28/2017 0.7  0.3 - 1.2 mg/dL Final  . GFR calc non Af Amer 09/28/2017 >60  >60 mL/min Final  . GFR calc Af Amer 09/28/2017 >60  >60 mL/min Final   Comment: (NOTE) The eGFR has been calculated using the CKD EPI equation. This calculation has not been validated in all clinical situations. eGFR's persistently <60 mL/min signify possible Chronic Kidney Disease.   Georgiann Hahn gap 09/28/2017 12  5 - 15 Final   Performed at [facility], [address], [place], [place] [address]  . LDH 09/28/2017 130  98 - 192 U/L Final   Performed at [facility], [address], [place], [place] [address]     Pathology Orders Placed This Encounter  Procedures  . CBC with Differential/Platelet    Standing Status:   Future    Standing Expiration Date:   09/29/2018  . Comprehensive metabolic panel    Standing Status:   Future    Standing Expiration Date:   09/29/2018  . Lactate dehydrogenase    Standing Status:   Future    Standing Expiration Date:   09/29/2018  . Ferritin    Standing Status:   Future    Standing Expiration Date:   09/29/2018       Zoila Shutter MD

## 2017-10-03 NOTE — Progress Notes (Signed)
HPI The patient presents for followup of atrial fib.  The patient has paroxysmal atrial fibrillation and is being treated with when necessary flecainide. Since I last saw her she was in the ED with recurrent fib and had DCCV in April.   She is being managed for iron deficiency anemia.  She was found to have an oozing colon polyp.   She was to have a colonoscopy but this was held secondary to the recent cardioversion.  She is now about 1 month from this and she is had no recurrence of fibrillation.  She has not yet rescheduled the colonoscopy.  She denies any recurrent palpitations, presyncope or syncope.  There is been no new cardiovascular symptoms such as chest discomfort, neck or arm discomfort.  She is had no new shortness of breath, PND or orthopnea.  She is limited by joint pain.  She works as a [employer] manager.   Allergies  Allergen Reactions  . Quinapril Hcl Cough  . Statins Other (See Comments)    Not all Statins but some cause cough and pain in legs.  . Tape Other (See Comments)    Redness, please use "paper" tape    Current Outpatient Medications  Medication Sig Dispense Refill  . chlorthalidone (HYGROTON) 25 MG tablet TAKE 1 TABLET (25 MG TOTAL) BY MOUTH DAILY. 90 tablet 2  . CRANBERRY PO Take 1 tablet by mouth 2 (two) times daily.    Marland Kitchen diltiazem (CARDIZEM) 30 MG tablet Take 1 tablet (30 mg total) by mouth daily. 30 tablet 3  . ELIQUIS 5 MG TABS tablet TAKE 1 TABLET (5 MG TOTAL) BY MOUTH 2 (TWO) TIMES DAILY. 60 tablet 5  . ezetimibe (ZETIA) 10 MG tablet Take 1 tablet (10 mg total) by mouth daily. 90 tablet 3  . ferrous sulfate (SLOW FE) 160 (50 Fe) MG TBCR SR tablet Take by mouth.    . flecainide (TAMBOCOR) 150 MG tablet TAKE 0.5 TABLETS (75 MG TOTAL) BY MOUTH 2 (TWO) TIMES DAILY. 90 tablet 3  . furosemide (LASIX) 20 MG tablet Take 1 tablet (20 mg total) by mouth daily as needed for fluid. 90 tablet 0  . glipiZIDE (GLUCOTROL) 5 MG tablet Take 1 tablet (5 mg total) by mouth 2  (two) times daily. 180 tablet 0  . ibuprofen (ADVIL,MOTRIN) 200 MG tablet Take by mouth.    . levothyroxine (SYNTHROID, LEVOTHROID) 112 MCG tablet TAKE 1 TABLET BY MOUTH EVERY OTHER DAY, ALTERNATING WITH 1 & 1/2 TABLETS EVERY OTHER DAY 45 tablet 0  . losartan (COZAAR) 100 MG tablet Take 1 tablet (100 mg total) by mouth daily. (Patient taking differently: Take 50 mg by mouth daily. ) 90 tablet 3  . lovastatin (MEVACOR) 40 MG tablet Take 1 tablet (40 mg total) by mouth every evening. 90 tablet 3  . MATZIM LA 180 MG 24 hr tablet TAKE ONE TABLET BY MOUTH ONE TIME DAILY 60 tablet 11  . metFORMIN (GLUCOPHAGE) 500 MG tablet TAKE 2 TABLETS IN THE MORNING AND ONE TABLET IN THE EVENING 270 tablet 3  . metoprolol succinate (TOPROL-XL) 100 MG 24 hr tablet Take 1 tablet (100 mg total) by mouth daily. 90 tablet 1  . pantoprazole (PROTONIX) 40 MG tablet TAKE ONE TABLET (40 MG DOSE) BY MOUTH 30 (THIRTY) MINUTES BEFORE BREAKFAST.  5  . traMADol (ULTRAM) 50 MG tablet Take 1 tablet (50 mg total) by mouth every 6 (six) hours as needed for moderate pain or severe pain. 30 tablet 0  Current Facility-Administered Medications  Medication Dose Route Frequency Provider Last Rate Last Dose  . cyanocobalamin ((VITAMIN B-12)) injection 1,000 mcg  1,000 mcg Intramuscular Q30 days Timmothy Euler, MD   1,000 mcg at 09/21/17 1058    Past Medical History:  Diagnosis Date  . Anemia   . Arthritis   . Bilateral carotid artery disease ([place])   . Diabetes mellitus without complication ([place])   . Difficult intubation    states 'lady that did the sleep study told me I have the smallest airway she has ever seen in an adult"  . Dysrhythmia   . Heart murmur   . Hypertension   . Hypothyroid   . Obesity   . Obstructive sleep apnea    on C Pap  . PAF (paroxysmal atrial fibrillation) ([place])    a. newly dx in 09/2013; on eliquis  . UTI (urinary tract infection)     Past Surgical History:  Procedure Laterality Date  . CATARACT  EXTRACTION Left   . CATARACT EXTRACTION W/PHACO Right 12/09/2015   Procedure: CATARACT EXTRACTION PHACO AND INTRAOCULAR LENS PLACEMENT (IOC);  Surgeon: Tonny Branch, MD;  Location: [facility];  Service: Ophthalmology;  Laterality: Right;  CDE:10.48  . COLONOSCOPY W/ POLYPECTOMY    . cyst on back of neck removed    . DILATION AND CURETTAGE OF UTERUS     x 5  . ENDARTERECTOMY Left 08/30/2015   Procedure: LEFT CAROYID ENDARTERECTOMY WITH XENOSURE BOVINE PERICARDIUM PATCH ANGIOPLASTY;  Surgeon: [name], MD;  Location: [facility];  Service: Vascular;  Laterality: Left;  . EYE SURGERY    . TONSILLECTOMY      ROS:  As stated in the HPI and negative for all other systems.  PHYSICAL EXAM BP (!) 185/73   Pulse (!) 59   Ht 5\' 2"  (1.575 m)   Wt 244 lb 6.4 oz (110.9 kg)   BMI 44.70 kg/m   GENERAL:  Well appearing NECK:  No jugular venous distention, waveform within normal limits, carotid upstroke brisk and symmetric, no bruits, no thyromegaly LUNGS:  Clear to auscultation bilaterally CHEST:  Unremarkable HEART:  PMI not displaced or sustained,S1 and S2 within normal limits, no S3, no S4, no clicks, no rubs, no murmurs ABD:  Flat, positive bowel sounds normal in frequency in pitch, no bruits, no rebound, no guarding, no midline pulsatile mass, no hepatomegaly, no splenomegaly EXT:  No edema   EKG:   Sinus rhythm, rate 59, axis leftward , intervals within normal limits , no acute ST-T wave changes, nonspecific inferior T wave changes unchanged from previous.  10/05/2017  ASSESSMENT AND PLAN  ATRIAL FIB:   .  [name] has a CHA2DS2 - VASc score of 4 with a risk of stroke of 4%.  She is status post DCCV in April.   She can now hold her Eliquis as needed for colonoscopy.  She is at acceptable risk for this procedure.  I will have her come back for a trough flecainide level.  HTN:  BP is elevated.  I will ask her to keep a BP diary.   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    ANEMIA:  As above    OBESITY:  The patient understands the need to lose weight with diet and exercise. We have discussed specific strategies for this.

## 2017-10-05 ENCOUNTER — Encounter: Payer: Self-pay | Admitting: Cardiology

## 2017-10-05 ENCOUNTER — Ambulatory Visit (INDEPENDENT_AMBULATORY_CARE_PROVIDER_SITE_OTHER): Payer: Medicare Other | Admitting: Cardiology

## 2017-10-05 VITALS — BP 185/73 | HR 59 | Ht 62.0 in | Wt 244.4 lb

## 2017-10-05 DIAGNOSIS — Z79899 Other long term (current) drug therapy: Secondary | ICD-10-CM | POA: Diagnosis not present

## 2017-10-05 DIAGNOSIS — I6523 Occlusion and stenosis of bilateral carotid arteries: Secondary | ICD-10-CM

## 2017-10-05 DIAGNOSIS — D649 Anemia, unspecified: Secondary | ICD-10-CM

## 2017-10-05 DIAGNOSIS — I4891 Unspecified atrial fibrillation: Secondary | ICD-10-CM

## 2017-10-05 DIAGNOSIS — I10 Essential (primary) hypertension: Secondary | ICD-10-CM | POA: Diagnosis not present

## 2017-10-05 NOTE — Patient Instructions (Signed)
Medication Instructions:  Continue current medication  If you need a refill on your cardiac medications before your next appointment, please call your pharmacy.  Labwork: Flecainide Level  HERE IN OUR OFFICE AT [facility]  Take the provided lab slips with you to the lab for your blood draw.    You will NOT need to fast   Testing/Procedures: None Ordered  Special Instructions: Please keep a blood pressure diary with daily blood pressure reading  Follow-Up: Your physician wants you to follow-up in: 3 Months in [place].     Thank you for choosing [facility] at [facility]!!

## 2017-10-06 ENCOUNTER — Other Ambulatory Visit: Payer: Medicare Other

## 2017-10-06 DIAGNOSIS — I4891 Unspecified atrial fibrillation: Secondary | ICD-10-CM | POA: Diagnosis not present

## 2017-10-06 DIAGNOSIS — Z79899 Other long term (current) drug therapy: Secondary | ICD-10-CM | POA: Diagnosis not present

## 2017-10-07 NOTE — Telephone Encounter (Signed)
According to recent office note on 10/05/17 by Dr. Percival Spanish:  [name] has a CHA2DS2 - VASc score of 4 with a risk of stroke of 4%.  She is status post DCCV in April.   She can now hold her Eliquis as needed for colonoscopy.  She is at acceptable risk for this procedure.    Pharmacy, please advise on timing of holding Eliquis.  Then can be routed to requesting provider for clearance.

## 2017-10-08 LAB — FLECAINIDE LEVEL: Flecainide: 0.31 ug/mL (ref 0.20–1.00)

## 2017-10-08 NOTE — Telephone Encounter (Signed)
Pt takes Eliquis for afib with CHADS2VASc score of 5 (age, sex, HTN, CAD, DM). She underwent cardioversion on 09/04/17. She is now > 1 month post-DCCV, recommend holding Eliquis for 24 hours prior to procedure.

## 2017-10-08 NOTE — Telephone Encounter (Signed)
   Primary Cardiologist:James Hochrein, MD  Chart reviewed as part of pre-operative protocol coverage. Pre-op clearance already addressed by colleagues in earlier phone notes.  To summarize recommendations:  - In OV 10/05/17, Dr. Percival Spanish stated, "She is status post DCCV in April.   She can now hold her Eliquis as needed for colonoscopy.  She is at acceptable risk for this procedure." - Per pharmacist, "Pt takes Eliquis for afib with CHADS2VASc score of 5 (age, sex, HTN, CAD, DM). She underwent cardioversion on 09/04/17. She is now > 1 month post-DCCV, recommend holding Eliquis for 24 hours prior to procedure."  Will route this bundled recommendation to requesting provider via Epic fax function. Please call with questions.  Charlie Pitter, PA-C 10/08/2017, 1:10 PM

## 2017-10-19 ENCOUNTER — Ambulatory Visit (INDEPENDENT_AMBULATORY_CARE_PROVIDER_SITE_OTHER): Payer: Medicare Other | Admitting: *Deleted

## 2017-10-19 DIAGNOSIS — E538 Deficiency of other specified B group vitamins: Secondary | ICD-10-CM

## 2017-10-19 NOTE — Progress Notes (Signed)
Pt given Cyanocobalamin inj Tolerated well 

## 2017-10-24 ENCOUNTER — Other Ambulatory Visit: Payer: Self-pay | Admitting: Family Medicine

## 2017-11-02 ENCOUNTER — Encounter: Payer: Self-pay | Admitting: Family Medicine

## 2017-11-02 ENCOUNTER — Other Ambulatory Visit: Payer: Self-pay | Admitting: Family Medicine

## 2017-11-02 ENCOUNTER — Ambulatory Visit (INDEPENDENT_AMBULATORY_CARE_PROVIDER_SITE_OTHER): Payer: Medicare Other | Admitting: Family Medicine

## 2017-11-02 VITALS — BP 149/63 | HR 63 | Temp 97.4°F | Ht 62.0 in | Wt 242.2 lb

## 2017-11-02 DIAGNOSIS — I6523 Occlusion and stenosis of bilateral carotid arteries: Secondary | ICD-10-CM

## 2017-11-02 DIAGNOSIS — E119 Type 2 diabetes mellitus without complications: Secondary | ICD-10-CM

## 2017-11-02 DIAGNOSIS — M79605 Pain in left leg: Secondary | ICD-10-CM

## 2017-11-02 LAB — BAYER DCA HB A1C WAIVED: HB A1C (BAYER DCA - WAIVED): 8.1 % — ABNORMAL HIGH (ref <7.0)

## 2017-11-02 MED ORDER — TRAMADOL HCL 50 MG PO TABS: 50.0000 mg | ORAL_TABLET | Freq: Four times a day (QID) | ORAL | 0 refills | Status: DC | PRN

## 2017-11-02 NOTE — Patient Instructions (Signed)
Great to see you!  Come back in 3 months to follow up for diabetes   

## 2017-11-02 NOTE — Progress Notes (Signed)
   HPI  Patient presents today here with L leg pain and diabetes  Diabetes mellitus 2 Compliant with meds - Yes Checking CBGs? No  Fasting avg -   Postprandial average -  Exercising regularly? - Yes Watching carbohydrate intake? - Yes Neuropathy ? - No Hypoglycemic events - No  - Recovers with :   Pertinent ROS:  Polyuria - No Polydipsia - No Vision problems - No  L Leg pain X 4 days, no injury, aching pain post L thigh and lower leg     PMH: Smoking status noted ROS: Per HPI  Objective: BP (!) 149/63   Pulse 63   Temp (!) 97.4 F (36.3 C) (Oral)   Ht 5\' 2"  (1.575 m)   Wt 242 lb 3.2 oz (109.9 kg)   BMI 44.30 kg/m  Gen: NAD, alert, cooperative with exam HEENT: NCAT, EOMI, PERRL CV: RRR, good S1/S2, no murmur Resp: CTABL, no wheezes, non-labored Abd: SNTND, BS present, no guarding or organomegaly Ext: No edema, warm Neuro: Alert and oriented, No gross deficits MSK Mild TTP L low back paraspinal muscles Negative straight leg rasie No pain with FABER Mild TTP post distal thigh and lower leg diffusely- no significant swelling  Assessment and plan:  # L leg pain Unclear etiology Avoid NSAIDs, tramadol Rx given.   # T2DM A1C pending No Chnages for now, clinically doing well, previously controlled  Laroy Apple, MD [facility] 11/02/2017, 1:13 PM

## 2017-11-04 ENCOUNTER — Telehealth: Payer: Self-pay | Admitting: Family Medicine

## 2017-11-04 NOTE — Telephone Encounter (Signed)
Pt has appt 11/05/17

## 2017-11-04 NOTE — Telephone Encounter (Signed)
Will need to be seen again for stronger pain med than tramadol.   Laroy Apple, MD [facility] 11/04/2017, 8:53 AM

## 2017-11-05 ENCOUNTER — Encounter: Payer: Self-pay | Admitting: Family Medicine

## 2017-11-05 ENCOUNTER — Ambulatory Visit (INDEPENDENT_AMBULATORY_CARE_PROVIDER_SITE_OTHER): Payer: Medicare Other | Admitting: Family Medicine

## 2017-11-05 VITALS — BP 126/53 | HR 53 | Temp 97.6°F | Ht 62.0 in | Wt 241.0 lb

## 2017-11-05 DIAGNOSIS — M79605 Pain in left leg: Secondary | ICD-10-CM | POA: Diagnosis not present

## 2017-11-05 DIAGNOSIS — I6523 Occlusion and stenosis of bilateral carotid arteries: Secondary | ICD-10-CM | POA: Diagnosis not present

## 2017-11-05 MED ORDER — OXYCODONE-ACETAMINOPHEN 5-325 MG PO TABS: 1.0000 | ORAL_TABLET | Freq: Four times a day (QID) | ORAL | 0 refills | Status: DC | PRN

## 2017-11-05 NOTE — Progress Notes (Signed)
   HPI  Patient presents today for follow-up leg pain.  Patient has left lateral leg pain radiating to the left medial lower leg as well as the anterior lower leg. She states that she was diagnosed with psoriatic arthritis over 10 years ago, she had issues with it for a few years and then abrupt cessation. Patient states this was diagnosed by rheumatology. She had a similar pain on the right leg a few years ago she attributed to psoriatic arthritis with improvement after a week or 2. She believes this is another episode, she would like to avoid seeing her rheumatologist again unless her pain does not improve after a week or so.  PMH: Smoking status noted ROS: Per HPI  Objective: BP (!) 126/53   Pulse (!) 53   Temp 97.6 F (36.4 C) (Oral)   Ht 5\' 2"  (1.575 m)   Wt 241 lb (109.3 kg)   BMI 44.08 kg/m  Gen: NAD, alert, cooperative with exam HEENT: NCAT CV: RRR, good S1/S2, no murmur Resp: CTABL, no wheezes, non-labored Abd: SNTND, BS present, no guarding or organomegaly Ext: No edema, warm Neuro: Alert and oriented, No gross deficits  MSK:  Mild tenderness over the left greater trochanter, negative Corky Sox and Fadir L hip L  knee without joint line tenderness.  ligamentously intact to Lachman's and with varus and valgus stress.  Negative McMurray's test   Assessment and plan:  #Left leg pain Unusual findings, no back symptoms. Possibly related to psoriatic arthritis, however there is no single joint focus or even multiple joint focus, it is more of a left thigh pain and left lower leg pain.  Possibly referred pain from the hip and knee. Given short course of oxycodone, offered rheumatology referral.  She will consider referral if she is not improving quickly. Patient was given tramadol a few days ago, however the pain is been so severe that she has had difficulty sleeping despite the tramadol.    Meds ordered this encounter  Medications  . oxyCODONE-acetaminophen  (PERCOCET/ROXICET) 5-325 MG tablet    Sig: Take 1-2 tablets by mouth every 6 (six) hours as needed for severe pain.    Dispense:  40 tablet    Refill:  0    Laroy Apple, MD [facility] 11/05/2017, 11:43 AM

## 2017-11-05 NOTE — Patient Instructions (Signed)
Great to see you!   

## 2017-11-07 ENCOUNTER — Emergency Department (HOSPITAL_COMMUNITY): Payer: Medicare Other

## 2017-11-07 ENCOUNTER — Emergency Department (HOSPITAL_COMMUNITY)
Admission: EM | Admit: 2017-11-07 | Discharge: 2017-11-07 | Disposition: A | Discharge status: Home / Self Care | Payer: Medicare Other | Attending: Emergency Medicine | Admitting: Emergency Medicine

## 2017-11-07 ENCOUNTER — Inpatient Hospital Stay (HOSPITAL_COMMUNITY): Admit: 2017-11-07 | Payer: Medicare Other

## 2017-11-07 ENCOUNTER — Encounter (HOSPITAL_COMMUNITY): Payer: Self-pay

## 2017-11-07 ENCOUNTER — Other Ambulatory Visit: Payer: Self-pay

## 2017-11-07 DIAGNOSIS — Z7984 Long term (current) use of oral hypoglycemic drugs: Secondary | ICD-10-CM | POA: Insufficient documentation

## 2017-11-07 DIAGNOSIS — Z79899 Other long term (current) drug therapy: Secondary | ICD-10-CM | POA: Diagnosis not present

## 2017-11-07 DIAGNOSIS — Z049 Encounter for examination and observation for unspecified reason: Secondary | ICD-10-CM | POA: Diagnosis not present

## 2017-11-07 DIAGNOSIS — R110 Nausea: Secondary | ICD-10-CM | POA: Diagnosis not present

## 2017-11-07 DIAGNOSIS — I10 Essential (primary) hypertension: Secondary | ICD-10-CM | POA: Diagnosis not present

## 2017-11-07 DIAGNOSIS — R739 Hyperglycemia, unspecified: Secondary | ICD-10-CM | POA: Diagnosis not present

## 2017-11-07 DIAGNOSIS — I4891 Unspecified atrial fibrillation: Secondary | ICD-10-CM | POA: Diagnosis not present

## 2017-11-07 DIAGNOSIS — E039 Hypothyroidism, unspecified: Secondary | ICD-10-CM | POA: Diagnosis not present

## 2017-11-07 DIAGNOSIS — R531 Weakness: Secondary | ICD-10-CM | POA: Diagnosis not present

## 2017-11-07 DIAGNOSIS — E1165 Type 2 diabetes mellitus with hyperglycemia: Secondary | ICD-10-CM | POA: Diagnosis not present

## 2017-11-07 DIAGNOSIS — Z7901 Long term (current) use of anticoagulants: Secondary | ICD-10-CM | POA: Diagnosis not present

## 2017-11-07 DIAGNOSIS — I517 Cardiomegaly: Secondary | ICD-10-CM | POA: Diagnosis not present

## 2017-11-07 LAB — COMPREHENSIVE METABOLIC PANEL
ALBUMIN: 4 g/dL (ref 3.5–5.0)
ALK PHOS: 90 U/L (ref 38–126)
ALT: 30 U/L (ref 14–54)
AST: 34 U/L (ref 15–41)
Anion gap: 9 (ref 5–15)
BUN: 36 mg/dL — AB (ref 6–20)
CALCIUM: 8.9 mg/dL (ref 8.9–10.3)
CO2: 26 mmol/L (ref 22–32)
Chloride: 97 mmol/L — ABNORMAL LOW (ref 101–111)
Creatinine, Ser: 0.7 mg/dL (ref 0.44–1.00)
GFR calc Af Amer: >60 mL/min (ref >60)
GFR calc non Af Amer: >60 mL/min (ref >60)
GLUCOSE: 393 mg/dL — AB (ref 65–99)
POTASSIUM: 4.1 mmol/L (ref 3.5–5.1)
Sodium: 132 mmol/L — ABNORMAL LOW (ref 135–145)
TOTAL PROTEIN: 7.6 g/dL (ref 6.5–8.1)
Total Bilirubin: 0.8 mg/dL (ref 0.3–1.2)

## 2017-11-07 LAB — CBG MONITORING, ED
GLUCOSE-CAPILLARY: 353 mg/dL — AB (ref 65–99)
Glucose-Capillary: 316 mg/dL — ABNORMAL HIGH (ref 65–99)

## 2017-11-07 LAB — URINALYSIS, ROUTINE W REFLEX MICROSCOPIC
BILIRUBIN URINE: NEGATIVE
Hgb urine dipstick: NEGATIVE
KETONES UR: 5 mg/dL — AB
NITRITE: NEGATIVE
PH: 5 (ref 5.0–8.0)
Protein, ur: NEGATIVE mg/dL
Specific Gravity, Urine: 1.023 (ref 1.005–1.030)

## 2017-11-07 LAB — I-STAT TROPONIN, ED: Troponin i, poc: 0 ng/mL (ref 0.00–0.08)

## 2017-11-07 LAB — CBC
HEMATOCRIT: 32.2 % — AB (ref 36.0–46.0)
Hemoglobin: 10.7 g/dL — ABNORMAL LOW (ref 12.0–15.0)
MCH: 30.1 pg (ref 26.0–34.0)
MCHC: 33.2 g/dL (ref 30.0–36.0)
MCV: 90.7 fL (ref 78.0–100.0)
PLATELETS: 228 10*3/uL (ref 150–400)
RBC: 3.55 MIL/uL — ABNORMAL LOW (ref 3.87–5.11)
RDW: 15 % (ref 11.5–15.5)
WBC: 9.5 10*3/uL (ref 4.0–10.5)

## 2017-11-07 MED ORDER — CEPHALEXIN 500 MG PO CAPS
500.0000 mg | ORAL_CAPSULE | Freq: Once | ORAL | Status: DC
  Filled 2017-11-07: qty 1

## 2017-11-07 MED ORDER — SODIUM CHLORIDE 0.9 % IV BOLUS
500.0000 mL | Freq: Once | INTRAVENOUS | Status: AC
  Administered 2017-11-07: 500 mL via INTRAVENOUS

## 2017-11-07 MED ORDER — CEPHALEXIN 500 MG PO CAPS: 500.0000 mg | ORAL_CAPSULE | Freq: Four times a day (QID) | ORAL | 0 refills | Status: AC

## 2017-11-07 NOTE — ED Provider Notes (Signed)
[facility] DEPT Provider Note   CSN: [id number] Arrival date & time: 11/07/17  1207     History   Chief Complaint Chief Complaint  Patient presents with  . Hyperglycemia    HPI Emily [name] is a 67 y.o. female presented for evaluation of episode of sweatiness, weakness, and hyperglycemia.  Patient states she was at church when she had acute onset of symptoms.  She felt very sweaty, and had associated nausea without vomiting.  She reports feeling weak.  They checked her blood sugar, is elevated at 413.  This is abnormal for her.  She denies chest pain or shortness of breath at the time of the incident.  Since this episode, she reports improvement of symptoms.  She is no longer feeling nauseous, diaphoretic, or as weak.  She reports left leg pain over the past week, this is being managed by her primary care doctor.  She denies recent fevers, chills, abdominal pain, abnormal urination, abnormal bowel movements.  She denies fall, trauma, or injury.  She has a history of diabetes for which she takes metformin and glipizide.  She states she ate several slices of cake last night and had a large glass of orange juice this morning, this might be why her blood sugars are high.  Her last A1c was done last week and it was 8.  She has a history of carotid artery disease, diabetes, hypertension, A. fib on Eliquis.  Patient states that she had a similar episode in the past, and was found to have a kidney infection, despite having no urinary symptoms at the time. H/o recent anemia, requiring iron transfusion and blood transfusions. No active bleeding at this time per pt.   HPI  Past Medical History:  Diagnosis Date  . Anemia   . Arthritis   . Bilateral carotid artery disease ([place])   . Diabetes mellitus without complication ([place])   . Difficult intubation    states 'lady that did the sleep study told me I have the smallest airway she has ever seen in an adult"  .  Dysrhythmia   . Heart murmur   . Hypertension   . Hypothyroid   . Obesity   . Obstructive sleep apnea    on C Pap  . PAF (paroxysmal atrial fibrillation) ([place])    a. newly dx in 09/2013; on eliquis  . UTI (urinary tract infection)     Patient Active Problem List   Diagnosis Date Noted  . Peripheral arterial disease ([place]) 07/06/2017  . Hyperlipidemia 06/29/2017  . Dyslipidemia 06/29/2017  . Iron deficiency anemia 05/27/2017  . Vitamin B12 deficiency 05/27/2017  . Pain in joint, ankle and foot 06/03/2016  . Carotid stenosis 08/30/2015  . Bilateral carotid artery disease ([place]) 07/05/2015  . Acute bronchitis 01/31/2015  . Diabetes mellitus without complication ([place])   . Hypertension 10/27/2013  . Hypothyroidism 10/27/2013  . Atrial fibrillation, rapid ([place]) 10/27/2013    Past Surgical History:  Procedure Laterality Date  . CATARACT EXTRACTION Left   . CATARACT EXTRACTION W/PHACO Right 12/09/2015   Procedure: CATARACT EXTRACTION PHACO AND INTRAOCULAR LENS PLACEMENT (IOC);  Surgeon: Tonny Branch, MD;  Location: [facility];  Service: Ophthalmology;  Laterality: Right;  CDE:10.48  . COLONOSCOPY W/ POLYPECTOMY    . cyst on back of neck removed    . DILATION AND CURETTAGE OF UTERUS     x 5  . ENDARTERECTOMY Left 08/30/2015   Procedure: LEFT CAROYID ENDARTERECTOMY WITH XENOSURE BOVINE PERICARDIUM PATCH ANGIOPLASTY;  Surgeon: [name], MD;  Location: [facility];  Service: Vascular;  Laterality: Left;  . EYE SURGERY    . TONSILLECTOMY       OB History   None      Home Medications    Prior to Admission medications   Medication Sig Start Date End Date Taking? Authorizing Provider  chlorthalidone (HYGROTON) 25 MG tablet TAKE 1 TABLET (25 MG TOTAL) BY MOUTH DAILY. 08/02/17 11/07/17 Yes [name], MD  CRANBERRY PO Take 1 tablet by mouth 2 (two) times daily.   Yes [provider]  ELIQUIS 5 MG TABS tablet TAKE 1 TABLET (5 MG TOTAL) BY MOUTH 2 (TWO) TIMES DAILY. 03/15/17   Yes Minus Breeding, MD  ezetimibe (ZETIA) 10 MG tablet Take 1 tablet (10 mg total) by mouth daily. 08/25/17 11/23/17 Yes Hochrein, Jeneen Rinks, MD  flecainide (TAMBOCOR) 150 MG tablet TAKE 0.5 TABLETS (75 MG TOTAL) BY MOUTH 2 (TWO) TIMES DAILY. 12/24/16  Yes Minus Breeding, MD  glipiZIDE (GLUCOTROL) 5 MG tablet Take 1 tablet (5 mg total) by mouth 2 (two) times daily. 05/12/17  Yes Timmothy Euler, MD  ibuprofen (ADVIL,MOTRIN) 200 MG tablet Take 200 mg by mouth daily as needed.    Yes [provider]  levothyroxine (SYNTHROID, LEVOTHROID) 112 MCG tablet TAKE 1 TABLET BY MOUTH EVERY OTHER DAY, ALTERNATING WITH 1 & 1/2 TABLETS EVERY OTHER DAY 10/26/17  Yes Timmothy Euler, MD  losartan (COZAAR) 100 MG tablet Take 1 tablet (100 mg total) by mouth daily. Patient taking differently: Take 50 mg by mouth daily.  06/29/17  Yes Minus Breeding, MD  lovastatin (MEVACOR) 40 MG tablet Take 1 tablet (40 mg total) by mouth every evening. 06/29/17  Yes Hochrein, Jeneen Rinks, MD  MATZIM LA 180 MG 24 hr tablet TAKE ONE TABLET BY MOUTH ONE TIME DAILY 07/22/17  Yes Minus Breeding, MD  metFORMIN (GLUCOPHAGE) 500 MG tablet TAKE 2 TABLETS IN THE MORNING AND ONE TABLET IN THE EVENING 06/22/17  Yes Timmothy Euler, MD  metoprolol succinate (TOPROL-XL) 100 MG 24 hr tablet TAKE 1 TABLET BY MOUTH EVERY DAY 11/02/17  Yes Timmothy Euler, MD  oxyCODONE-acetaminophen (PERCOCET/ROXICET) 5-325 MG tablet Take 1-2 tablets by mouth every 6 (six) hours as needed for severe pain. 11/05/17  Yes Timmothy Euler, MD  pantoprazole (PROTONIX) 40 MG tablet TAKE ONE TABLET (40 MG DOSE) BY MOUTH 30 (THIRTY) MINUTES BEFORE BREAKFAST. 09/10/17  Yes [provider]  PRESCRIPTION MEDICATION Apply 1 application topically daily.   Yes [provider]  traMADol (ULTRAM) 50 MG tablet Take 1 tablet (50 mg total) by mouth every 6 (six) hours as needed for moderate pain or severe pain. 11/02/17  Yes Timmothy Euler, MD  cephALEXin  (KEFLEX) 500 MG capsule Take 1 capsule (500 mg total) by mouth 4 (four) times daily for 7 days. 11/07/17 11/14/17  Olean Sangster, PA-C  diltiazem (CARDIZEM) 30 MG tablet Take 1 tablet (30 mg total) by mouth daily. 09/13/17   Minus Breeding, MD  furosemide (LASIX) 20 MG tablet Take 1 tablet (20 mg total) by mouth daily as needed for fluid. 08/19/17 06/08/20  Timmothy Euler, MD    Family History Family History  Problem Relation Age of Onset  . COPD Father   . Heart failure Father   . Heart disease Father   . Arrhythmia Sister   . Arrhythmia Sister   . Arrhythmia Sister        had PPM also  .  Cancer Sister     Social History Social History   Tobacco Use  . Smoking status: Never Smoker  . Smokeless tobacco: Never Used  Substance Use Topics  . Alcohol use: No    Alcohol/week: 0.0 oz  . Drug use: No     Allergies   Quinapril hcl; Statins; and Tape   Review of Systems Review of Systems  Constitutional: Positive for diaphoresis (resolved).  Respiratory: Negative for shortness of breath.   Cardiovascular: Negative for chest pain.  Gastrointestinal: Positive for nausea (resolved).  Endocrine: Positive for polydipsia. Negative for polyphagia and polyuria.  Musculoskeletal: Positive for myalgias (L leg).  Neurological: Positive for weakness (resolved).  All other systems reviewed and are negative.    Physical Exam Updated Vital Signs BP (!) 166/65   Pulse 64   Temp (!) 97.3 F (36.3 C) (Oral)   Resp 17   Ht 5' 2.5" (1.588 m)   Wt 109.3 kg (241 lb)   SpO2 97%   BMI 43.38 kg/m   Physical Exam  Constitutional: She is oriented to person, place, and time. She appears well-developed and well-nourished. No distress.  Appears in NAD  HENT:  Head: Normocephalic and atraumatic.  Nose: Nose normal.  Mouth/Throat: Uvula is midline, oropharynx is clear and moist and mucous membranes are normal.  Eyes: Pupils are equal, round, and reactive to light. Conjunctivae and EOM  are normal.  Neck: Normal range of motion. Neck supple.  Cardiovascular: Normal rate, regular rhythm and intact distal pulses.  Pulmonary/Chest: Effort normal and breath sounds normal. No respiratory distress. She has no wheezes.  Clear lung sounds in all fields  Abdominal: Soft. Bowel sounds are normal. She exhibits no distension and no mass. There is no tenderness. There is no rebound and no guarding.  No TTP of the abd. No flank pain.  Musculoskeletal: Normal range of motion. She exhibits edema. She exhibits no tenderness.  Mild bilateral leg swelling, baseline per pt.  Neurological: She is alert and oriented to person, place, and time. No sensory deficit.  Skin: Skin is warm and dry. Capillary refill takes less than 2 seconds.  Psychiatric: She has a normal mood and affect.  Nursing note and vitals reviewed.    ED Treatments / Results  Labs (all labs ordered are listed, but only abnormal results are displayed) Labs Reviewed  CBC - Abnormal; Notable for the following components:      Result Value   RBC 3.55 (*)    Hemoglobin 10.7 (*)    HCT 32.2 (*)    All other components within normal limits  URINALYSIS, ROUTINE W REFLEX MICROSCOPIC - Abnormal; Notable for the following components:   APPearance CLOUDY (*)    Glucose, UA >=500 (*)    Ketones, ur 5 (*)    Leukocytes, UA MODERATE (*)    Bacteria, UA FEW (*)    All other components within normal limits  COMPREHENSIVE METABOLIC PANEL - Abnormal; Notable for the following components:   Sodium 132 (*)    Chloride 97 (*)    Glucose, Bld 393 (*)    BUN 36 (*)    All other components within normal limits  CBG MONITORING, ED - Abnormal; Notable for the following components:   Glucose-Capillary 353 (*)    All other components within normal limits  CBG MONITORING, ED - Abnormal; Notable for the following components:   Glucose-Capillary 316 (*)    All other components within normal limits  URINE CULTURE  I-STAT TROPONIN, ED  CBG  MONITORING, ED    EKG EKG Interpretation  Date/Time:  Sunday November 07 2017 12:21:05 EDT Ventricular Rate:  51 PR Interval:    QRS Duration: 116 QT Interval:  516 QTC Calculation: 476 R Axis:   -22 Text Interpretation:  Sinus rhythm Left ventricular hypertrophy Anterior Q waves, possibly due to LVH Borderline T abnormalities, inferior leads nonspecific changes Confirmed by Campos, Kevin ([id number]) on 11/07/2017 2:10:53 PM   Radiology Dg Chest 2 View  Result Date: 11/07/2017 CLINICAL DATA:  Severe weakness.  Atrial fibrillation.  Diabetes. EXAM: CHEST - 2 VIEW COMPARISON:  11/05/2015 FINDINGS: Stable mild cardiomegaly. Aortic atherosclerosis. Both lungs are clear. IMPRESSION: Stable mild cardiomegaly.  No active lung disease. Electronically Signed   By: John  Stahl M.D.   On: 11/07/2017 13:49    Procedures Procedures (including critical care time)  Medications Ordered in ED Medications  cephALEXin (KEFLEX) capsule 500 mg (has no administration in time range)  sodium chloride 0.9 % bolus 500 mL (0 mLs Intravenous Stopped 11/07/17 1521)     Initial Impression / Assessment and Plan / ED Course  I have reviewed the triage vital signs and the nursing notes.  Pertinent labs & imaging results that were available during my care of the patient were reviewed by me and considered in my medical decision making (see chart for details).     Patient presenting for evaluation of incidents of hypoglycemia, sweatiness, nausea, and weakness.  Symptoms have mostly resolved, at this time she has hyperglycemia and polydipsia.  No chest pain or shortness of breath throughout this.  Will obtain basic labs, troponin, EKG, chest x-ray for further assessment.  gentle IV fluids for hyperglycemia.  EKG shows ST depression when compared to prior, but no ST elevation.  Troponin negative.  Labs show hyperglycemia without DKA.  Baseline anemia at 10.7. UA without infection at this time, urine culture sent.  As patient  had similar episode with a previous UTI/kidney infection, will start on antibiotics until culture returns.  Blood sugar improving with fluids.  Discussed with attending, Dr. Campos evaluated the patient.  At this time, patient appears safe for discharge.  Return precautions given.  Patient states she understands and agrees to plan.   Final Clinical Impressions(s) / ED Diagnoses   Final diagnoses:  Hyperglycemia    ED Discharge Orders        Ordered    cephALEXin (KEFLEX) 500 MG capsule  4 times daily     06 /09/19 [place], Carle Dargan, PA-C 11/07/17 [place], Kevin, MD 11/07/17 2306

## 2017-11-07 NOTE — ED Notes (Signed)
Bed: WA07 Expected date:  Expected time:  Means of arrival:  Comments: 67 yo weakness, hyperglycemia

## 2017-11-07 NOTE — ED Notes (Signed)
ED Provider at bedside. 

## 2017-11-07 NOTE — ED Notes (Signed)
Patient transported to X-ray 

## 2017-11-07 NOTE — ED Triage Notes (Signed)
Pt BIB EMS from home c/o increasing weakness today. Initial CBG at church this am was 412. EMS reading was 322.  20ga R AC 500 mL NaCl given en route

## 2017-11-07 NOTE — Discharge Instructions (Addendum)
Try to avoid carbohydrates and sugary foods.  Make sure you are staying well hydrated with water. Follow up with your primary care doctor in 2 days regarding your symptoms, blood sugar, and urine culture results. Take antibiotics as prescribed unless told to stop by your primary care.  Return to the ER if you develop chest pain, difficulty breathing, or any new or concerning symptoms.

## 2017-11-07 NOTE — ED Notes (Signed)
Pt given ice chips

## 2017-11-09 ENCOUNTER — Encounter: Payer: Self-pay | Admitting: Family Medicine

## 2017-11-09 ENCOUNTER — Ambulatory Visit (INDEPENDENT_AMBULATORY_CARE_PROVIDER_SITE_OTHER): Payer: Medicare Other | Admitting: Family Medicine

## 2017-11-09 VITALS — BP 126/51 | HR 53 | Temp 97.3°F | Ht 62.5 in | Wt 239.0 lb

## 2017-11-09 DIAGNOSIS — M79605 Pain in left leg: Secondary | ICD-10-CM | POA: Diagnosis not present

## 2017-11-09 DIAGNOSIS — I6523 Occlusion and stenosis of bilateral carotid arteries: Secondary | ICD-10-CM

## 2017-11-09 DIAGNOSIS — E119 Type 2 diabetes mellitus without complications: Secondary | ICD-10-CM

## 2017-11-09 LAB — URINE CULTURE

## 2017-11-09 MED ORDER — DILTIAZEM HCL 30 MG PO TABS: ORAL_TABLET | ORAL | 3 refills | Status: DC

## 2017-11-09 MED ORDER — METFORMIN HCL 1000 MG PO TABS: 1000.0000 mg | ORAL_TABLET | Freq: Two times a day (BID) | ORAL | 3 refills | Status: DC

## 2017-11-09 NOTE — Patient Instructions (Signed)
Great to see you!  Reduce metformin to 2 tablets twice daily, when you go to get a refill will have 1 g pills, then you will take 1 tablet twice daily to have the same amount of medication.  We will work on a referral to rheumatology, you will be called within 1 week for an appointment.

## 2017-11-09 NOTE — Progress Notes (Signed)
   HPI  Patient presents today here for ER follow-up.  Patient states she just did not feel right, her blood sugar was checked at church and found to be over 400.  She went to the hospital and was found to have some concern for UTI as well as glucose up to 393.  Started on Keflex for UTI, she has not seen significant improvement in her symptoms. She continues to have fatigue which is nonspecific.  She continues to have left leg pain. As detailed in my last note she describes a history of psoriatic arthritis diagnosed by rheumatology approximately 10 years ago in [place].  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 She states that the rheumatologist did do labs and stated it was likely psoriatic arthritis. She has not had problems with it for the last several years but does feel that this left leg pain is similar to her last episode of psoriatic arthritis.    PMH: Smoking status noted ROS: Per HPI  Objective: BP (!) 126/51   Pulse (!) 53   Temp (!) 97.3 F (36.3 C) (Oral)   Ht 5' 2.5" (1.588 m)   Wt 239 lb (108.4 kg)   BMI 43.02 kg/m  Gen: NAD, alert, cooperative with exam HEENT: NCAT CV: RRR, good S1/S2, no murmur Resp: CTABL, no wheezes, non-labored Ext: No edema, warm Neuro: Alert and oriented, No gross deficits  Assessment and plan:  #Left leg pain Unusual description, unclear etiology Patient feels this is likely due to psoriatic arthritis, however she does not have any joints specifically that are affected or are impressive on exam I recommended rheumatology evaluation, she is now willing to go.  Recommended avoiding steroids given her hyperglycemia  #Type 2 diabetes I believe her fatigue could be simple symptomatic hyperglycemia Recommend maximize metformin to 1 g twice daily She is making positive changes to her diet due to recent uncontrolled blood sugars Her previous A1c was recently well controlled, no other changes for now   Her  urine culture came back positive for multiple organisms, unlikely to be the true source of her symptoms.  We also clarified her medications, patient states that she has been using short acting diltiazem only as needed, at the hospital they had several questions about this.  We reviewed daily long-acting diltiazem and flecainide, using 30 mg short acting diltiazem only as needed.  Orders Placed This Encounter  Procedures  . Ambulatory referral to Rheumatology    Referral Priority:   Routine    Referral Type:   Consultation    Referral Reason:   Specialty Services Required    Referred to Provider:   Bo Merino, MD    Requested Specialty:   Rheumatology    Number of Visits Requested:   1    Meds ordered this encounter  Medications  . diltiazem (CARDIZEM) 30 MG tablet    Sig: 4 hours AS NEEDED for heart rate >100 as long as blood pressure >100.    Dispense:  30 tablet    Refill:  3  . metFORMIN (GLUCOPHAGE) 1000 MG tablet    Sig: Take 1 tablet (1,000 mg total) by mouth 2 (two) times daily with a meal.    Dispense:  180 tablet    Refill:  [place], MD [facility] 11/09/2017, 11:35 AM

## 2017-11-10 ENCOUNTER — Telehealth: Payer: Self-pay | Admitting: Emergency Medicine

## 2017-11-10 NOTE — Telephone Encounter (Signed)
Post ED Visit - Positive Culture Follow-up  Culture report reviewed by antimicrobial stewardship pharmacist:  []  Elenor Quinones, Pharm.D. []  Heide Guile, Pharm.D., BCPS AQ-ID []  Parks Neptune, Pharm.D., BCPS []  Alycia Rossetti, Pharm.D., BCPS []  [place], Pharm.D., BCPS, AAHIVP []  Legrand Como, Pharm.D., BCPS, AAHIVP []  Salome Arnt, PharmD, BCPS []  Wynell Balloon, PharmD []  Vincenza Hews, PharmD, BCPS [facility] PharmD  Positive urine culture Treated with cephalexin, organism sensitive to the same and no further patient follow-up is required at this time.  Hazle Nordmann 11/10/2017, 1:14 PM

## 2017-11-15 ENCOUNTER — Telehealth: Payer: Self-pay | Admitting: Family Medicine

## 2017-11-15 DIAGNOSIS — R011 Cardiac murmur, unspecified: Secondary | ICD-10-CM | POA: Diagnosis not present

## 2017-11-15 DIAGNOSIS — K922 Gastrointestinal hemorrhage, unspecified: Secondary | ICD-10-CM | POA: Diagnosis not present

## 2017-11-15 DIAGNOSIS — K589 Irritable bowel syndrome without diarrhea: Secondary | ICD-10-CM | POA: Diagnosis not present

## 2017-11-15 DIAGNOSIS — I480 Paroxysmal atrial fibrillation: Secondary | ICD-10-CM | POA: Diagnosis not present

## 2017-11-15 DIAGNOSIS — I10 Essential (primary) hypertension: Secondary | ICD-10-CM | POA: Diagnosis not present

## 2017-11-15 DIAGNOSIS — E785 Hyperlipidemia, unspecified: Secondary | ICD-10-CM | POA: Diagnosis not present

## 2017-11-15 DIAGNOSIS — M79605 Pain in left leg: Secondary | ICD-10-CM

## 2017-11-15 DIAGNOSIS — Z7901 Long term (current) use of anticoagulants: Secondary | ICD-10-CM | POA: Diagnosis not present

## 2017-11-15 DIAGNOSIS — K317 Polyp of stomach and duodenum: Secondary | ICD-10-CM | POA: Diagnosis not present

## 2017-11-15 DIAGNOSIS — L259 Unspecified contact dermatitis, unspecified cause: Secondary | ICD-10-CM | POA: Diagnosis not present

## 2017-11-15 DIAGNOSIS — Z79899 Other long term (current) drug therapy: Secondary | ICD-10-CM | POA: Diagnosis not present

## 2017-11-15 DIAGNOSIS — Z7984 Long term (current) use of oral hypoglycemic drugs: Secondary | ICD-10-CM | POA: Diagnosis not present

## 2017-11-15 DIAGNOSIS — E039 Hypothyroidism, unspecified: Secondary | ICD-10-CM | POA: Diagnosis not present

## 2017-11-15 DIAGNOSIS — D509 Iron deficiency anemia, unspecified: Secondary | ICD-10-CM | POA: Diagnosis not present

## 2017-11-15 DIAGNOSIS — M1990 Unspecified osteoarthritis, unspecified site: Secondary | ICD-10-CM | POA: Diagnosis not present

## 2017-11-15 DIAGNOSIS — G4733 Obstructive sleep apnea (adult) (pediatric): Secondary | ICD-10-CM | POA: Diagnosis not present

## 2017-11-15 DIAGNOSIS — E119 Type 2 diabetes mellitus without complications: Secondary | ICD-10-CM | POA: Diagnosis not present

## 2017-11-15 DIAGNOSIS — L4050 Arthropathic psoriasis, unspecified: Secondary | ICD-10-CM | POA: Diagnosis not present

## 2017-11-15 DIAGNOSIS — Z888 Allergy status to other drugs, medicaments and biological substances status: Secondary | ICD-10-CM | POA: Diagnosis not present

## 2017-11-15 NOTE — Telephone Encounter (Signed)
Patient aware of recommendations.  

## 2017-11-15 NOTE — Telephone Encounter (Signed)
No relief leg pain, not able to sleep well with the pain. Pt is not sure what she needs to do. She states that she hasn't heard from rheumatologist and she doesn't know if that is really where she needs to go.

## 2017-11-15 NOTE — Telephone Encounter (Signed)
Referral placed to ortho.   Laroy Apple, MD [facility] 11/15/2017, 2:48 PM

## 2017-11-17 ENCOUNTER — Ambulatory Visit (INDEPENDENT_AMBULATORY_CARE_PROVIDER_SITE_OTHER): Payer: Medicare Other | Admitting: *Deleted

## 2017-11-17 DIAGNOSIS — E538 Deficiency of other specified B group vitamins: Secondary | ICD-10-CM

## 2017-11-17 NOTE — Progress Notes (Signed)
Pt given B12 injection IM left deltoid and tolerated well. °

## 2017-11-23 ENCOUNTER — Other Ambulatory Visit: Payer: Self-pay | Admitting: Physician Assistant

## 2017-11-23 ENCOUNTER — Ambulatory Visit (INDEPENDENT_AMBULATORY_CARE_PROVIDER_SITE_OTHER): Payer: Medicare Other | Admitting: Family Medicine

## 2017-11-23 ENCOUNTER — Ambulatory Visit (INDEPENDENT_AMBULATORY_CARE_PROVIDER_SITE_OTHER): Payer: Medicare Other

## 2017-11-23 ENCOUNTER — Encounter: Payer: Self-pay | Admitting: Family Medicine

## 2017-11-23 VITALS — BP 126/48 | HR 60 | Temp 98.3°F | Ht 62.5 in | Wt 237.6 lb

## 2017-11-23 DIAGNOSIS — I6523 Occlusion and stenosis of bilateral carotid arteries: Secondary | ICD-10-CM

## 2017-11-23 DIAGNOSIS — M79605 Pain in left leg: Secondary | ICD-10-CM | POA: Diagnosis not present

## 2017-11-23 DIAGNOSIS — M47816 Spondylosis without myelopathy or radiculopathy, lumbar region: Secondary | ICD-10-CM | POA: Diagnosis not present

## 2017-11-23 DIAGNOSIS — M5136 Other intervertebral disc degeneration, lumbar region: Secondary | ICD-10-CM | POA: Diagnosis not present

## 2017-11-23 MED ORDER — OXYCODONE-ACETAMINOPHEN 5-325 MG PO TABS: 1.0000 | ORAL_TABLET | Freq: Four times a day (QID) | ORAL | 0 refills | Status: DC | PRN

## 2017-11-23 NOTE — Progress Notes (Signed)
   HPI  Patient presents today for follow-up left leg pain.  Patient states that her pain is worse at night.  She has leg pain radiating down the left anterior leg across the thigh and the lower anterior leg. She states that she has been using oxycodone to help her sleep at night as that when the pain is the worst.  He has slept sitting up a few times.  She does not have a recliner and has not tried that.  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  She has a new patient appointment coming up with rheumatology, she describes a history of psoriatic arthritis diagnosed by rheumatology over 10 years ago.  PMH: Smoking status noted ROS: Per HPI  Objective: BP (!) 126/48   Pulse 60   Temp 98.3 F (36.8 C) (Oral)   Ht 5' 2.5" (1.588 m)   Wt 237 lb 9.6 oz (107.8 kg)   BMI 42.77 kg/m  Gen: NAD, alert, cooperative with exam HEENT: NCAT, EOMI, PERRL CV: RRR, good S1/S2, no murmur Resp: CTABL, no wheezes, non-labored Ext: No edema, warm Neuro: Alert and oriented, No gross deficits MSK : Modified straight leg raise left leg is negative  Assessment and plan:  #Leg pain Patient feels it is consistent with what was previously described as a layer of psoriatic arthritis, we have an appointment coming up with rheumatology-appreciate their recommendations. Considering the distribution of pain I think radiculopathy is probably most likely Plain film today Refilled oxycodone, discussed decreasing doses If this becomes a chronic problem could consider something that may help sleep and augment pain like amitriptyline  Meds ordered this encounter  Medications  . oxyCODONE-acetaminophen (PERCOCET/ROXICET) 5-325 MG tablet    Sig: Take 1-2 tablets by mouth every 6 (six) hours as needed for severe pain.    Dispense:  40 tablet    Refill:  0    Laroy Apple, MD [facility] 11/23/2017, 10:44 AM

## 2017-11-23 NOTE — Progress Notes (Signed)
Office Visit Note  Patient: Emily [name]             Date of Birth: 24-Dec-1950           MRN: [id number]             PCP: Timmothy Euler, MD Referring: Timmothy Euler, MD Visit Date: 12/07/2017 Occupation: @GUAROCC @    Subjective:  Left leg pain  History of Present Illness: Emily [name] is a 67 y.o. female seen in consultation per request of her PCP.  According to patient about 13 years ago she was diagnosed with psoriatic arthritis at [facility] clinic in [place] as she was having swelling and discomfort in her bilateral hands.  She was also experiencing morning stiffness.  She gives a history of possible psoriasis on her bilateral elbows few years before that.  She states she was never diagnosed with psoriasis by dermatologist.  She was experiencing pain during the night and she was given anti-inflammatories.  She states she usually took Advil by herself and the symptoms got better.  In November 2017 she started experiencing right lower extremity pain and difficulty walking which lasted for about 3 weeks.  She states she went to the hospital and was given a cortisone injection to her right knee joint.  She states the symptoms in her right lower extremity got better but she felt weak in the right lower extremity to the point she fell one time.  The symptoms improved over time.  In June 2019 she started experiencing similar pain in her left lower extremity.  She states the pain was all over the lower extremity.  She applied heat to her lower extremity which helped.  She was also given oxycodone by her PCP which help with the pain and discomfort.  She states now the pain is better but she feels weakness in her left lower extremity.  She also gives history of insomnia.  She had x-ray of her lumbar spine which showed some arthritic changes per patient.  None of the other joints are painful now.  She has no active rash currently.  Activities of Daily Living:  Patient reports morning  stiffness for 15 minutes.   Patient Reports nocturnal pain.  Difficulty dressing/grooming: Denies Difficulty climbing stairs: Reports Difficulty getting out of chair: Reports Difficulty using hands for taps, buttons, cutlery, and/or writing: Denies   Review of Systems  Constitutional: Positive for fatigue.  HENT: Positive for mouth dryness. Negative for mouth sores and nose dryness.   Eyes: Negative for pain, visual disturbance and dryness.  Respiratory: Negative for cough, hemoptysis, shortness of breath and difficulty breathing.   Cardiovascular: Positive for palpitations. Negative for chest pain, hypertension and swelling in legs/feet.  Gastrointestinal: Negative for blood in stool, constipation and diarrhea.  Endocrine: Negative for increased urination.  Genitourinary: Negative for painful urination.  Musculoskeletal: Positive for arthralgias, joint pain, myalgias, morning stiffness and myalgias. Negative for joint swelling, muscle weakness and muscle tenderness.  Skin: Positive for rash (Hx of psoriasis, no current patches). Negative for color change, pallor, hair loss, nodules/bumps, skin tightness, ulcers and sensitivity to sunlight.  Allergic/Immunologic: Negative for susceptible to infections.  Neurological: Negative for dizziness, numbness, headaches and weakness.  Hematological: Negative for swollen glands.  Psychiatric/Behavioral: Positive for sleep disturbance. Negative for depressed mood. The patient is not nervous/anxious.     PMFS History:  Patient Active Problem List   Diagnosis Date Noted  . DDD (degenerative disc disease), lumbar 12/07/2017  . Peripheral arterial disease (  [place]) 07/06/2017  . Hyperlipidemia 06/29/2017  . Dyslipidemia 06/29/2017  . Iron deficiency anemia 05/27/2017  . Vitamin B12 deficiency 05/27/2017  . Pain in joint, ankle and foot 06/03/2016  . Carotid stenosis 08/30/2015  . Bilateral carotid artery disease ([place]) 07/05/2015  . Acute bronchitis  01/31/2015  . Diabetes mellitus without complication ([place])   . Hypertension 10/27/2013  . Hypothyroidism 10/27/2013  . Atrial fibrillation, rapid ([place]) 10/27/2013    Past Medical History:  Diagnosis Date  . Anemia   . Arthritis   . Bilateral carotid artery disease ([place])   . Diabetes mellitus without complication ([place])   . Difficult intubation    states 'lady that did the sleep study told me I have the smallest airway she has ever seen in an adult"  . Dysrhythmia   . Heart murmur   . Hypertension   . Hypothyroid   . Obesity   . Obstructive sleep apnea    on C Pap  . PAF (paroxysmal atrial fibrillation) ([place])    a. newly dx in 09/2013; on eliquis  . UTI (urinary tract infection)     Family History  Problem Relation Age of Onset  . COPD Father   . Heart failure Father   . Heart disease Father   . Emphysema Father   . Arrhythmia Sister   . Arrhythmia Sister   . Arrhythmia Sister        had PPM also  . Cancer Sister    Past Surgical History:  Procedure Laterality Date  . CATARACT EXTRACTION Left   . CATARACT EXTRACTION W/PHACO Right 12/09/2015   Procedure: CATARACT EXTRACTION PHACO AND INTRAOCULAR LENS PLACEMENT ([facility]);  Surgeon: Tonny Branch, MD;  Location: [facility];  Service: Ophthalmology;  Laterality: Right;  CDE:10.48  . COLONOSCOPY W/ POLYPECTOMY    . cyst on back of neck removed    . DILATION AND CURETTAGE OF UTERUS     x 5  . ENDARTERECTOMY Left 08/30/2015   Procedure: LEFT CAROYID ENDARTERECTOMY WITH XENOSURE BOVINE PERICARDIUM PATCH ANGIOPLASTY;  Surgeon: [name], MD;  Location: [facility];  Service: Vascular;  Laterality: Left;  . EYE SURGERY    . TONSILLECTOMY    . UPPER GI ENDOSCOPY     Social History   Social History Narrative  . Not on file     Objective: Vital Signs: BP 136/72 (BP Location: Right Arm, Patient Position: Sitting, Cuff Size: Large)   Pulse 74   Resp 16   Ht 5' 2.6" (1.59 m)   Wt 239 lb (108.4 kg)   BMI 42.88 kg/m    Physical Exam   Constitutional: She is oriented to person, place, and time. She appears well-developed and well-nourished.  HENT:  Head: Normocephalic and atraumatic.  Eyes: Conjunctivae and EOM are normal.  Neck: Normal range of motion.  Cardiovascular: Normal rate, regular rhythm, normal heart sounds and intact distal pulses.  Pulmonary/Chest: Effort normal and breath sounds normal.  Abdominal: Soft. Bowel sounds are normal.  Lymphadenopathy:    She has no cervical adenopathy.  Neurological: She is alert and oriented to person, place, and time.  Skin: Skin is warm and dry. Capillary refill takes less than 2 seconds.  Psychiatric: She has a normal mood and affect. Her behavior is normal.  Nursing note and vitals reviewed.    Musculoskeletal Exam: C-spine thoracic spine and lumbar spine fairly good range of motion.  She had no SI joint tenderness.  Shoulder joints elbow joints wrist joints are good range  of motion.  She had thickening of PIP and DIP joints bilaterally with no synovitis.  Hip joints knee joints ankles MTPs PIPs were in good range of motion.  She is crepitus with range of motion of her knee joints without any warmth swelling or effusion.  There is no evidence of Achilles tendinitis or plantar fasciitis.  CDAI Exam: No CDAI exam completed.    Investigation: No additional findings.  Component     Latest Ref Rng & Units 09/28/2017  Iron     28 - 170 ug/dL 48  TIBC     250 - 450 ug/dL 342  Saturation Ratios     10.4 - 31.8 % 14  UIBC     ug/dL 294  LDH     98 - 192 U/L 130  Ferritin     11 - 307 ng/mL 58  Vitamin B12     180 - 914 pg/mL 485  Folate     >5.9 ng/mL 27.5   CBC Latest Ref Rng & Units 11/07/2017 09/28/2017 09/04/2017  WBC 4.0 - 10.5 K/uL 9.5 7.9 7.1  Hemoglobin 12.0 - 15.0 g/dL 10.7(L) 10.6(L) 10.1(L)  Hematocrit 36.0 - 46.0 % 32.2(L) 33.8(L) 31.8(L)  Platelets 150 - 400 K/uL 228 202 201   CMP Latest Ref Rng & Units 11/07/2017 09/28/2017 09/04/2017  Glucose 65 - 99  mg/dL 393(H) 275(H) 293(H)  BUN 6 - 20 mg/dL 36(H) 20 21(H)  Creatinine 0.44 - 1.00 mg/dL 0.70 0.63 0.64  Sodium 135 - 145 mmol/L 132(L) 135 136  Potassium 3.5 - 5.1 mmol/L 4.1 4.2 3.4(L)  Chloride 101 - 111 mmol/L 97(L) 97(L) 101  CO2 22 - 32 mmol/L 26 26 23   Calcium 8.9 - 10.3 mg/dL 8.9 9.2 8.8(L)  Total Protein 6.5 - 8.1 g/dL 7.6 7.5 -  Total Bilirubin 0.3 - 1.2 mg/dL 0.8 0.7 -  Alkaline Phos 38 - 126 U/L 90 103 -  AST 15 - 41 U/L 34 31 -  ALT 14 - 54 U/L 30 22 -     Imaging: Dg Chest 2 View  Result Date: 11/07/2017 CLINICAL DATA:  Severe weakness.  Atrial fibrillation.  Diabetes. EXAM: CHEST - 2 VIEW COMPARISON:  11/05/2015 FINDINGS: Stable mild cardiomegaly. Aortic atherosclerosis. Both lungs are clear. IMPRESSION: Stable mild cardiomegaly.  No active lung disease. Electronically Signed   By: Earle Gell M.D.   On: 11/07/2017 13:49   Dg Lumbar Spine 2-3 Views  Result Date: 11/23/2017 CLINICAL DATA:  Left leg pain EXAM: LUMBAR SPINE - 2-3 VIEW COMPARISON:  CT abdomen pelvis 05/26/2014 FINDINGS: There is approximately 5 mm anterolisthesis of L4 on L5 which appears stable compared to sagittal images from CT of 2015. There is diffuse degenerative disc disease most marked at L2-3, L3-4, and L4-5 levels. There is degenerative change of the facet joints of the lower lumbar spine. No compression deformity is seen. Moderate abdominal aortic atherosclerosis is noted. The SI joints appear corticated. IUD is noted in the mid pelvis. IMPRESSION: 1. Stable 5 mm anterolisthesis of L4 on L5 which appears to be due to degenerative change of the facet joints. 2. Degenerative disc disease primarily at L2-3, L3-4, and L4-5 levels. Electronically Signed   By: Ivar Drape M.D.   On: 11/23/2017 15:47   Xr Hand 2 View Left  Result Date: 12/07/2017 PIP and DIP narrowing and right CMC narrowing was noted.  No MCP joint narrowing was noted.  No intercarpal radiocarpal joint space narrowing was noted.  No erosive  changes were noted. Impression: These findings are consistent with osteoarthritis of the hand.  Xr Hand 2 View Right  Result Date: 12/07/2017 PIP and DIP narrowing and right CMC narrowing was noted.  No MCP joint narrowing was noted.  No intercarpal radiocarpal joint space narrowing was noted.  No erosive changes were noted. Impression: These findings are consistent with osteoarthritis of the hand.  Xr Knee 3 View Left  Result Date: 12/07/2017 Moderate medial compartment narrowing and medial osteophytes were noted.  Intercondylar osteophytes were noted.  No chondrocalcinosis was noted.  Moderate patellofemoral narrowing was noted. Impression: These findings are consistent with moderate osteoarthritis and moderate chondromalacia patella.  Xr Knee 3 View Right  Result Date: 12/07/2017 Moderate medial compartment narrowing and medial osteophytes were noted.  Intercondylar osteophytes were noted.  No chondrocalcinosis was noted.  Moderate patellofemoral narrowing was noted. Impression: These findings are consistent with moderate osteoarthritis and moderate chondromalacia patella.   Speciality Comments: No specialty comments available.    Procedures:  No procedures performed Allergies: Quinapril hcl; Statins; and Tape   Assessment / Plan:     Visit Diagnoses: Pain in both hands -patient reports that several years ago she had pain and inflammation in her hands and was diagnosed with psoriatic arthritis.  She states at the time she had some rash over her elbows but it was never diagnosed as psoriasis by dermatologist.  The discomfort responded to Advil and she had no recurrence of symptoms.  She continues to have mild stiffness in her hands now.  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  Plan: XR Hand 2 View Right, XR Hand 2 View Left.  The x-rays were consistent with osteoarthritis.  Joint protection muscle strengthening was discussed.  Handout on hand exercises was given.  Her list  of natural anti-inflammatories was given.  Chronic pain of both knees -she has severe pain and discomfort in her right knee joint in 2017 which resolved by itself after cortisone injection.  Now she is been experiencing pain in her left knee and left lower extremity.  She states pain was much more intense which is eased off in the last few days.  Plan: XR KNEE 3 VIEW RIGHT, XR KNEE 3 VIEW LEFT.  The x-ray showed bilateral moderate osteoarthritis and moderate chondromalacia patella.  I handout on knee joint muscle strengthening exercises was given.  Weight loss diet and exercise will be helpful.  She also complains of some muscle spasms at nighttime which get better after walking.  It is possible she may have component of restless leg syndrome.  I have advised her to discuss this further with her PCP.  DDD (degenerative disc disease), lumbar-she has mild chronic lower back pain.  I reviewed x-rays done on the lumbar spine which are consistent with DDD of lumbar spine.  I reviewed x-rays of her lumbar spine which did not show any syndesmophytes.  Other medical problems are listed as follows:  Peripheral arterial disease ([place])  Essential hypertension  Bilateral carotid artery stenosis  Atrial fibrillation, rapid ([place])  History of hypothyroidism  History of diabetes mellitus  Dyslipidemia  History of iron deficiency anemia  Vitamin B12 deficiency    Orders: Orders Placed This Encounter  Procedures  . XR KNEE 3 VIEW RIGHT  . XR KNEE 3 VIEW LEFT  . XR Hand 2 View Right  . XR Hand 2 View Left   No orders of the defined types were placed in this encounter.   Face-to-face  time spent with patient was 50 minutes. Greater than 50% of time was spent in counseling and coordination of care.  Follow-Up Instructions: Return for Osteoarthritis.   Bo Merino, MD  Note - This record has been created using Editor, commissioning.  Chart creation errors have been sought, but may not always    have been located. Such creation errors do not reflect on  the standard of medical care.

## 2017-11-24 ENCOUNTER — Other Ambulatory Visit: Payer: Self-pay | Admitting: Family Medicine

## 2017-12-07 ENCOUNTER — Ambulatory Visit (INDEPENDENT_AMBULATORY_CARE_PROVIDER_SITE_OTHER): Payer: Self-pay

## 2017-12-07 ENCOUNTER — Ambulatory Visit (INDEPENDENT_AMBULATORY_CARE_PROVIDER_SITE_OTHER): Payer: Medicare Other

## 2017-12-07 ENCOUNTER — Ambulatory Visit (INDEPENDENT_AMBULATORY_CARE_PROVIDER_SITE_OTHER): Payer: Medicare Other | Admitting: Rheumatology

## 2017-12-07 ENCOUNTER — Encounter: Payer: Self-pay | Admitting: Rheumatology

## 2017-12-07 VITALS — BP 136/72 | HR 74 | Resp 16 | Ht 62.6 in | Wt 239.0 lb

## 2017-12-07 DIAGNOSIS — M79641 Pain in right hand: Secondary | ICD-10-CM

## 2017-12-07 DIAGNOSIS — M79642 Pain in left hand: Secondary | ICD-10-CM | POA: Diagnosis not present

## 2017-12-07 DIAGNOSIS — E785 Hyperlipidemia, unspecified: Secondary | ICD-10-CM | POA: Diagnosis not present

## 2017-12-07 DIAGNOSIS — M51369 Other intervertebral disc degeneration, lumbar region without mention of lumbar back pain or lower extremity pain: Secondary | ICD-10-CM

## 2017-12-07 DIAGNOSIS — M25562 Pain in left knee: Secondary | ICD-10-CM | POA: Diagnosis not present

## 2017-12-07 DIAGNOSIS — I10 Essential (primary) hypertension: Secondary | ICD-10-CM

## 2017-12-07 DIAGNOSIS — E538 Deficiency of other specified B group vitamins: Secondary | ICD-10-CM | POA: Diagnosis not present

## 2017-12-07 DIAGNOSIS — Z862 Personal history of diseases of the blood and blood-forming organs and certain disorders involving the immune mechanism: Secondary | ICD-10-CM

## 2017-12-07 DIAGNOSIS — Z8639 Personal history of other endocrine, nutritional and metabolic disease: Secondary | ICD-10-CM | POA: Diagnosis not present

## 2017-12-07 DIAGNOSIS — I6523 Occlusion and stenosis of bilateral carotid arteries: Secondary | ICD-10-CM | POA: Diagnosis not present

## 2017-12-07 DIAGNOSIS — M25561 Pain in right knee: Secondary | ICD-10-CM | POA: Diagnosis not present

## 2017-12-07 DIAGNOSIS — I739 Peripheral vascular disease, unspecified: Secondary | ICD-10-CM | POA: Diagnosis not present

## 2017-12-07 DIAGNOSIS — G8929 Other chronic pain: Secondary | ICD-10-CM

## 2017-12-07 DIAGNOSIS — I4891 Unspecified atrial fibrillation: Secondary | ICD-10-CM | POA: Diagnosis not present

## 2017-12-07 DIAGNOSIS — M5136 Other intervertebral disc degeneration, lumbar region: Secondary | ICD-10-CM | POA: Diagnosis not present

## 2017-12-07 NOTE — Patient Instructions (Signed)
Natural anti-inflammatories  You can purchase these at [employer], [employer] or online.  . Turmeric (capsules)  . Ginger (ginger root or capsules)  . Omega 3 (Fish, flax seeds, chia seeds, walnuts, almonds)  . Tart cherry (dried or extract)   Patient should be under the care of a physician while taking these supplements. This may not be reproduced without the permission of Dr. Shaili Deveshwar.  Knee Exercises Ask your health care provider which exercises are safe for you. Do exercises exactly as told by your health care provider and adjust them as directed. It is normal to feel mild stretching, pulling, tightness, or discomfort as you do these exercises, but you should stop right away if you feel sudden pain or your pain gets worse.Do not begin these exercises until told by your health care provider. STRETCHING AND RANGE OF MOTION EXERCISES These exercises warm up your muscles and joints and improve the movement and flexibility of your knee. These exercises also help to relieve pain, numbness, and tingling. Exercise A: Knee Extension, Prone 1. Lie on your abdomen on a bed. 2. Place your left / right knee just beyond the edge of the surface so your knee is not on the bed. You can put a towel under your left / right thigh just above your knee for comfort. 3. Relax your leg muscles and allow gravity to straighten your knee. You should feel a stretch behind your left / right knee. 4. Hold this position for __________ seconds. 5. Scoot up so your knee is supported between repetitions. Repeat __________ times. Complete this stretch __________ times a day. Exercise B: Knee Flexion, Active  1. Lie on your back with both knees straight. If this causes back discomfort, bend your left / right knee so your foot is flat on the floor. 2. Slowly slide your left / right heel back toward your buttocks until you feel a gentle stretch in the front of your knee or thigh. 3. Hold this position for  __________ seconds. 4. Slowly slide your left / right heel back to the starting position. Repeat __________ times. Complete this exercise __________ times a day. Exercise C: Quadriceps, Prone  1. Lie on your abdomen on a firm surface, such as a bed or padded floor. 2. Bend your left / right knee and hold your ankle. If you cannot reach your ankle or pant leg, loop a belt around your foot and grab the belt instead. 3. Gently pull your heel toward your buttocks. Your knee should not slide out to the side. You should feel a stretch in the front of your thigh and knee. 4. Hold this position for __________ seconds. Repeat __________ times. Complete this stretch __________ times a day. Exercise D: Hamstring, Supine 1. Lie on your back. 2. Loop a belt or towel over the ball of your left / right foot. The ball of your foot is on the walking surface, right under your toes. 3. Straighten your left / right knee and slowly pull on the belt to raise your leg until you feel a gentle stretch behind your knee. ? Do not let your left / right knee bend while you do this. ? Keep your other leg flat on the floor. 4. Hold this position for __________ seconds. Repeat __________ times. Complete this stretch __________ times a day. STRENGTHENING EXERCISES These exercises build strength and endurance in your knee. Endurance is the ability to use your muscles for a long time, even after they get tired. Exercise E:   Quadriceps, Isometric  1. Lie on your back with your left / right leg extended and your other knee bent. Put a rolled towel or small pillow under your knee if told by your health care provider. 2. Slowly tense the muscles in the front of your left / right thigh. You should see your kneecap slide up toward your hip or see increased dimpling just above the knee. This motion will push the back of the knee toward the floor. 3. For __________ seconds, keep the muscle as tight as you can without increasing your  pain. 4. Relax the muscles slowly and completely. Repeat __________ times. Complete this exercise __________ times a day. Exercise F: Straight Leg Raises - Quadriceps 1. Lie on your back with your left / right leg extended and your other knee bent. 2. Tense the muscles in the front of your left / right thigh. You should see your kneecap slide up or see increased dimpling just above the knee. Your thigh may even shake a bit. 3. Keep these muscles tight as you raise your leg 4-6 inches (10-15 cm) off the floor. Do not let your knee bend. 4. Hold this position for __________ seconds. 5. Keep these muscles tense as you lower your leg. 6. Relax your muscles slowly and completely after each repetition. Repeat __________ times. Complete this exercise __________ times a day. Exercise G: Hamstring, Isometric 1. Lie on your back on a firm surface. 2. Bend your left / right knee approximately __________ degrees. 3. Dig your left / right heel into the surface as if you are trying to pull it toward your buttocks. Tighten the muscles in the back of your thighs to dig as hard as you can without increasing any pain. 4. Hold this position for __________ seconds. 5. Release the tension gradually and allow your muscles to relax completely for __________ seconds after each repetition. Repeat __________ times. Complete this exercise __________ times a day. Exercise H: Hamstring Curls  If told by your health care provider, do this exercise while wearing ankle weights. Begin with __________ weights. Then increase the weight by 1 lb (0.5 kg) increments. Do not wear ankle weights that are more than __________. 1. Lie on your abdomen with your legs straight. 2. Bend your left / right knee as far as you can without feeling pain. Keep your hips flat against the floor. 3. Hold this position for __________ seconds. 4. Slowly lower your leg to the starting position.  Repeat __________ times. Complete this exercise  __________ times a day. Exercise I: Squats (Quadriceps) 1. Stand in front of a table, with your feet and knees pointing straight ahead. You may rest your hands on the table for balance but not for support. 2. Slowly bend your knees and lower your hips like you are going to sit in a chair. ? Keep your weight over your heels, not over your toes. ? Keep your lower legs upright so they are parallel with the table legs. ? Do not let your hips go lower than your knees. ? Do not bend lower than told by your health care provider. ? If your knee pain increases, do not bend as low. 3. Hold the squat position for __________ seconds. 4. Slowly push with your legs to return to standing. Do not use your hands to pull yourself to standing. Repeat __________ times. Complete this exercise __________ times a day. Exercise J: Wall Slides (Quadriceps)  1. Lean your back against a smooth wall or door while  you walk your feet out 18-24 inches (46-61 cm) from it. 2. Place your feet hip-width apart. 3. Slowly slide down the wall or door until your knees bend __________ degrees. Keep your knees over your heels, not over your toes. Keep your knees in line with your hips. 4. Hold for __________ seconds. Repeat __________ times. Complete this exercise __________ times a day. Exercise K: Straight Leg Raises - Hip Abductors 1. Lie on your side with your left / right leg in the top position. Lie so your head, shoulder, knee, and hip line up. You may bend your bottom knee to help you keep your balance. 2. Roll your hips slightly forward so your hips are stacked directly over each other and your left / right knee is facing forward. 3. Leading with your heel, lift your top leg 4-6 inches (10-15 cm). You should feel the muscles in your outer hip lifting. ? Do not let your foot drift forward. ? Do not let your knee roll toward the ceiling. 4. Hold this position for __________ seconds. 5. Slowly return your leg to the starting  position. 6. Let your muscles relax completely after each repetition. Repeat __________ times. Complete this exercise __________ times a day. Exercise L: Straight Leg Raises - Hip Extensors 1. Lie on your abdomen on a firm surface. You can put a pillow under your hips if that is more comfortable. 2. Tense the muscles in your buttocks and lift your left / right leg about 4-6 inches (10-15 cm). Keep your knee straight as you lift your leg. 3. Hold this position for __________ seconds. 4. Slowly lower your leg to the starting position. 5. Let your leg relax completely after each repetition. Repeat __________ times. Complete this exercise __________ times a day. This information is not intended to replace advice given to you by your health care provider. Make sure you discuss any questions you have with your health care provider. Document Released: 04/01/2005 Document Revised: 02/10/2016 Document Reviewed: 03/24/2015 Elsevier Interactive Patient Education  2018 [place] Exercises Hand exercises can be helpful to almost anyone. These exercises can strengthen the hands, improve flexibility and movement, and increase blood flow to the hands. These results can make work and daily tasks easier. Hand exercises can be especially helpful for people who have joint pain from arthritis or have nerve damage from overuse (carpal tunnel syndrome). These exercises can also help people who have injured a hand. Most of these hand exercises are fairly gentle stretching routines. You can do them often throughout the day. Still, it is a good idea to ask your health care provider which exercises would be best for you. Warming your hands before exercise may help to reduce stiffness. You can do this with gentle massage or by placing your hands in warm water for 15 minutes. Also, make sure you pay attention to your level of hand pain as you begin an exercise routine. Exercises Knuckle Bend Repeat this exercise  5-10 times with each hand. 1. Stand or sit with your arm, hand, and all five fingers pointed straight up. Make sure your wrist is straight. 2. Gently and slowly bend your fingers down and inward until the tips of your fingers are touching the tops of your palm. 3. Hold this position for a few seconds. 4. Extend your fingers out to their original position, all pointing straight up again.  Finger Fan Repeat this exercise 5-10 times with each hand. 1. Hold your arm and hand out in  front of you. Keep your wrist straight. 2. Squeeze your hand into a fist. 3. Hold this position for a few seconds. 4. Edison Simon out, or spread apart, your hand and fingers as much as possible, stretching every joint fully.  Tabletop Repeat this exercise 5-10 times with each hand. 1. Stand or sit with your arm, hand, and all five fingers pointed straight up. Make sure your wrist is straight. 2. Gently and slowly bend your fingers at the knuckles where they meet the hand until your hand is making an upside-down L shape. Your fingers should form a tabletop. 3. Hold this position for a few seconds. 4. Extend your fingers out to their original position, all pointing straight up again.  Making Os Repeat this exercise 5-10 times with each hand. 1. Stand or sit with your arm, hand, and all five fingers pointed straight up. Make sure your wrist is straight. 2. Make an O shape by touching your pointer finger to your thumb. Hold for a few seconds. Then open your hand wide. 3. Repeat this motion with each finger on your hand.  Table Spread Repeat this exercise 5-10 times with each hand. 1. Place your hand on a table with your palm facing down. Make sure your wrist is straight. 2. Spread your fingers out as much as possible. Hold this position for a few seconds. 3. Slide your fingers back together again. Hold for a few seconds.  Ball Grip  Repeat this exercise 10-15 times with each hand. 1. Hold a tennis ball or another soft  ball in your hand. 2. While slowly increasing pressure, squeeze the ball as hard as possible. 3. Squeeze as hard as you can for 3-5 seconds. 4. Relax and repeat.  Wrist Curls Repeat this exercise 10-15 times with each hand. 1. Sit in a chair that has armrests. 2. Hold a light weight in your hand, such as a dumbbell that weighs 1-3 pounds (0.5-1.4 kg). Ask your health care provider what weight would be best for you. 3. Rest your hand just over the end of the chair arm with your palm facing up. 4. Gently pivot your wrist up and down while holding the weight. Do not twist your wrist from side to side.  Contact a health care provider if:  Your hand pain or discomfort gets much worse when you do an exercise.  Your hand pain or discomfort does not improve within 2 hours after you exercise. If you have any of these problems, stop doing these exercises right away. Do not do them again unless your health care provider says that you can. Get help right away if:  You develop sudden, severe hand pain. If this happens, stop doing these exercises right away. Do not do them again unless your health care provider says that you can. This information is not intended to replace advice given to you by your health care provider. Make sure you discuss any questions you have with your health care provider. Document Released: 04/29/2015 Document Revised: 10/24/2015 Document Reviewed: 11/26/2014 Elsevier Interactive Patient Education  [employer].

## 2017-12-08 ENCOUNTER — Other Ambulatory Visit (HOSPITAL_COMMUNITY): Payer: Self-pay | Admitting: Cardiology

## 2017-12-21 ENCOUNTER — Encounter: Payer: Medicare Other | Admitting: Family Medicine

## 2017-12-21 ENCOUNTER — Ambulatory Visit: Payer: Medicare Other

## 2017-12-21 ENCOUNTER — Encounter: Payer: Self-pay | Admitting: *Deleted

## 2017-12-21 ENCOUNTER — Inpatient Hospital Stay (HOSPITAL_COMMUNITY): Payer: Medicare Other | Attending: Hematology

## 2017-12-21 ENCOUNTER — Ambulatory Visit (INDEPENDENT_AMBULATORY_CARE_PROVIDER_SITE_OTHER): Payer: Medicare Other | Admitting: Family Medicine

## 2017-12-21 ENCOUNTER — Ambulatory Visit (INDEPENDENT_AMBULATORY_CARE_PROVIDER_SITE_OTHER): Payer: Medicare Other | Admitting: *Deleted

## 2017-12-21 ENCOUNTER — Encounter: Payer: Self-pay | Admitting: Family Medicine

## 2017-12-21 VITALS — BP 147/57 | HR 82 | Temp 98.1°F | Ht 62.6 in | Wt 241.2 lb

## 2017-12-21 VITALS — BP 143/62 | HR 82 | Ht 62.6 in | Wt 241.6 lb

## 2017-12-21 DIAGNOSIS — I6523 Occlusion and stenosis of bilateral carotid arteries: Secondary | ICD-10-CM

## 2017-12-21 DIAGNOSIS — M79605 Pain in left leg: Secondary | ICD-10-CM | POA: Diagnosis not present

## 2017-12-21 DIAGNOSIS — I10 Essential (primary) hypertension: Secondary | ICD-10-CM | POA: Insufficient documentation

## 2017-12-21 DIAGNOSIS — Z7901 Long term (current) use of anticoagulants: Secondary | ICD-10-CM | POA: Diagnosis not present

## 2017-12-21 DIAGNOSIS — R5382 Chronic fatigue, unspecified: Secondary | ICD-10-CM | POA: Diagnosis not present

## 2017-12-21 DIAGNOSIS — I4891 Unspecified atrial fibrillation: Secondary | ICD-10-CM | POA: Insufficient documentation

## 2017-12-21 DIAGNOSIS — G2581 Restless legs syndrome: Secondary | ICD-10-CM

## 2017-12-21 DIAGNOSIS — Z0001 Encounter for general adult medical examination with abnormal findings: Secondary | ICD-10-CM

## 2017-12-21 DIAGNOSIS — D508 Other iron deficiency anemias: Secondary | ICD-10-CM

## 2017-12-21 DIAGNOSIS — E538 Deficiency of other specified B group vitamins: Secondary | ICD-10-CM

## 2017-12-21 DIAGNOSIS — E039 Hypothyroidism, unspecified: Secondary | ICD-10-CM | POA: Insufficient documentation

## 2017-12-21 DIAGNOSIS — Z0000 Encounter for general adult medical examination without abnormal findings: Secondary | ICD-10-CM

## 2017-12-21 LAB — COMPREHENSIVE METABOLIC PANEL
ALK PHOS: 91 U/L (ref 38–126)
ALT: 19 U/L (ref 0–44)
AST: 24 U/L (ref 15–41)
Albumin: 4 g/dL (ref 3.5–5.0)
Anion gap: 8 (ref 5–15)
BILIRUBIN TOTAL: 0.7 mg/dL (ref 0.3–1.2)
BUN: 24 mg/dL — AB (ref 8–23)
CALCIUM: 9 mg/dL (ref 8.9–10.3)
CO2: 28 mmol/L (ref 22–32)
CREATININE: 0.72 mg/dL (ref 0.44–1.00)
Chloride: 98 mmol/L (ref 98–111)
GFR calc Af Amer: >60 mL/min (ref >60)
Glucose, Bld: 272 mg/dL — ABNORMAL HIGH (ref 70–99)
Potassium: 3.7 mmol/L (ref 3.5–5.1)
Sodium: 134 mmol/L — ABNORMAL LOW (ref 135–145)
TOTAL PROTEIN: 7.5 g/dL (ref 6.5–8.1)

## 2017-12-21 LAB — LACTATE DEHYDROGENASE: LDH: 126 U/L (ref 98–192)

## 2017-12-21 LAB — CBC WITH DIFFERENTIAL/PLATELET
BASOS ABS: 0.1 10*3/uL (ref 0.0–0.1)
Basophils Relative: 1 %
Eosinophils Absolute: 0.3 10*3/uL (ref 0.0–0.7)
Eosinophils Relative: 3 %
HEMATOCRIT: 32.4 % — AB (ref 36.0–46.0)
HEMOGLOBIN: 10.2 g/dL — AB (ref 12.0–15.0)
LYMPHS PCT: 11 %
Lymphs Abs: 1.1 10*3/uL (ref 0.7–4.0)
MCH: 28.2 pg (ref 26.0–34.0)
MCHC: 31.5 g/dL (ref 30.0–36.0)
MCV: 89.5 fL (ref 78.0–100.0)
Monocytes Absolute: 0.7 10*3/uL (ref 0.1–1.0)
Monocytes Relative: 8 %
NEUTROS ABS: 7.5 10*3/uL (ref 1.7–7.7)
Neutrophils Relative %: 77 %
Platelets: 264 10*3/uL (ref 150–400)
RBC: 3.62 MIL/uL — AB (ref 3.87–5.11)
RDW: 14.9 % (ref 11.5–15.5)
WBC: 9.7 10*3/uL (ref 4.0–10.5)

## 2017-12-21 LAB — FERRITIN: Ferritin: 8 ng/mL — ABNORMAL LOW (ref 11–307)

## 2017-12-21 MED ORDER — ROPINIROLE HCL 0.5 MG PO TABS: 0.2500 mg | ORAL_TABLET | Freq: Every day | ORAL | 0 refills | Status: DC

## 2017-12-21 MED ORDER — ROPINIROLE HCL 0.5 MG PO TABS: 0.2500 mg | ORAL_TABLET | Freq: Every day | ORAL | 1 refills | Status: DC

## 2017-12-21 MED ORDER — OXYCODONE-ACETAMINOPHEN 5-325 MG PO TABS: 1.0000 | ORAL_TABLET | Freq: Four times a day (QID) | ORAL | 0 refills | Status: DC | PRN

## 2017-12-21 MED ORDER — LEVOTHYROXINE SODIUM 112 MCG PO TABS: ORAL_TABLET | ORAL | 3 refills | Status: DC

## 2017-12-21 NOTE — Patient Instructions (Signed)
Keep all follow up appointments Try to eat 3 meals daily that consist of lean proteins, fruits and vegetables Increase water intake Try to exercise for at least 30 minutes, 3 times weekly.    Emily [name] , Thank you for taking time to come for your Medicare Wellness Visit. I appreciate your ongoing commitment to your health goals. Please review the following plan we discussed and let me know if I can assist you in the future.   These are the goals we discussed: Goals    . Weight (lb) < 200 lb (90.7 kg)     Eat 3 healthy meals daily that consist of lean proteins, fruits, and vegetables Try to exercise for at least 30 minutes, 3 times weekly Increase Water intake        This is a list of the screening recommended for you and due dates:  Health Maintenance  Topic Date Due  .  Hepatitis C: One time screening is recommended by Center for Disease Control  (CDC) for  adults born from [birth year] through 1966.   1951-04-15  . Complete foot exam   04/02/2017  . Flu Shot  12/30/2017  . Hemoglobin A1C  05/04/2018  . Eye exam for diabetics  06/29/2018  . Pneumonia vaccines (2 of 2 - PPSV23) 01/31/2019  . Mammogram  04/14/2019  . Tetanus Vaccine  05/26/2024  . Colon Cancer Screening  07/03/2024  . DEXA scan (bone density measurement)  Completed    Advance Directive Advance directives are legal documents that let you make choices ahead of time about your health care and medical treatment in case you become unable to communicate for yourself. Advance directives are a way for you to communicate your wishes to family, friends, and health care providers. This can help convey your decisions about end-of-life care if you become unable to communicate. Discussing and writing advance directives should happen over time rather than all at once. Advance directives can be changed depending on your situation and what you want, even after you have signed the advance directives. If you do not have an advance  directive, some states assign family decision makers to act on your behalf based on how closely you are related to them. Each state has its own laws regarding advance directives. You may want to check with your health care provider, attorney, or state representative about the laws in your state. There are different types of advance directives, such as:  Medical power of attorney.  Living will.  Do not resuscitate (DNR) or do not attempt resuscitation (DNAR) order.  Health care proxy and medical power of attorney A health care proxy, also called a health care agent, is a person who is appointed to make medical decisions for you in cases in which you are unable to make the decisions yourself. Generally, people choose someone they know well and trust to represent their preferences. Make sure to ask this person for an agreement to act as your proxy. A proxy may have to exercise judgment in the event of a medical decision for which your wishes are not known. A medical power of attorney is a legal document that names your health care proxy. Depending on the laws in your state, after the document is written, it may also need to be:  Signed.  Notarized.  Dated.  Copied.  Witnessed.  Incorporated into your medical record.  You may also want to appoint someone to manage your financial affairs in a situation in which you are unable  to do so. This is called a durable power of attorney for finances. It is a separate legal document from the durable power of attorney for health care. You may choose the same person or someone different from your health care proxy to act as your agent in financial matters. If you do not appoint a proxy, or if there is a concern that the proxy is not acting in your best interests, a court-appointed guardian may be designated to act on your behalf. Living will A living will is a set of instructions documenting your wishes about medical care when you cannot express them  yourself. Health care providers should keep a copy of your living will in your medical record. You may want to give a copy to family members or friends. To alert caregivers in case of an emergency, you can place a card in your wallet to let them know that you have a living will and where they can find it. A living will is used if you become:  Terminally ill.  Incapacitated.  Unable to communicate or make decisions.  Items to consider in your living will include:  The use or non-use of life-sustaining equipment, such as dialysis machines and breathing machines (ventilators).  A DNR or DNAR order, which is the instruction not to use cardiopulmonary resuscitation (CPR) if breathing or heartbeat stops.  The use or non-use of tube feeding.  Withholding of food and fluids.  Comfort (palliative) care when the goal becomes comfort rather than a cure.  Organ and tissue donation.  A living will does not give instructions for distributing your money and property if you should pass away. It is recommended that you seek the advice of a lawyer when writing a will. Decisions about taxes, beneficiaries, and asset distribution will be legally binding. This process can relieve your family and friends of any concerns surrounding disputes or questions that may come up about the distribution of your assets. DNR or DNAR A DNR or DNAR order is a request not to have CPR in the event that your heart stops beating or you stop breathing. If a DNR or DNAR order has not been made and shared, a health care provider will try to help any patient whose heart has stopped or who has stopped breathing. If you plan to have surgery, talk with your health care provider about how your DNR or DNAR order will be followed if problems occur. Summary  Advance directives are the legal documents that allow you to make choices ahead of time about your health care and medical treatment in case you become unable to communicate for  yourself.  The process of discussing and writing advance directives should happen over time. You can change the advance directives, even after you have signed them.  Advance directives include DNR or DNAR orders, living wills, and designating an agent as your medical power of attorney. This information is not intended to replace advice given to you by your health care provider. Make sure you discuss any questions you have with your health care provider. Document Released: 08/25/2007 Document Revised: 04/06/2016 Document Reviewed: 04/06/2016 Elsevier Interactive Patient Education  2017 [employer].

## 2017-12-21 NOTE — Progress Notes (Signed)
   HPI  Patient presents today here for follow-up left leg pain and restless leg syndrome.  Patient's had this left leg pain now for about 1 month.  Patient is having both upper left leg pain and lower left leg pain.  She was seen by rheumatology for for evaluation of possible psoriatic arthritis, she was not found to have evidence of this.  Dr. Bobbye Morton did think that she may have RLS.  Patient endorses multiple symptoms of RLS including restless legs at night and difficulty getting comfortable while trying to sleep due to irritated legs.  Left leg pain continued, patient using oxycodone occasionally for severe pain.  She would like a refill.   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 Follow-up 4 to 6 weeks with new PCP   Meds ordered this encounter  Medications  . levothyroxine (SYNTHROID, LEVOTHROID) 112 MCG tablet    Sig: TAKE 1 TABLET BY MOUTH EVERY OTHER DAY, ALTERNATING WITH 1 & 1/2 TABLETS EVERY OTHER DAY    Dispense:  45 tablet    Refill:  3  . oxyCODONE-acetaminophen (PERCOCET/ROXICET) 5-325 MG tablet    Sig: Take 1-2 tablets by mouth every 6 (six) hours as needed for severe pain.    Dispense:  40 tablet    Refill:  0     Laroy Apple, MD [facility] 12/21/2017, 10:01 AM

## 2017-12-21 NOTE — Patient Instructions (Signed)
Great to see you!  For Restless leg:  Start with 1/2 tablet at night 1-3 hours before bed After 3 days increase to 1 pill  After 7 days if not better increase to 2 pills at bedtime.   Come back in 1 month for follow up restless leg syndrome

## 2017-12-21 NOTE — Progress Notes (Addendum)
Subjective:   Emily [name] is a 67 y.o. female who presents for an Initial Medicare Annual Wellness Visit.  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  She enjoys attending church and helping in Sunday school.  She has 2 nephews that lives close by and visits often.  She also has 2 close friends that she talks to and visits often.  Emily [name] currently eats unhealthy meals daily and does not exercise at this time.  She has had an ER visit in the past year due to irregular heart beat but overall feels that her health is about the same as it was a year ago    Objective:    Today's Vitals   12/21/17 0919  BP: (!) 143/62  Pulse: 82  Weight: 241 lb 9.6 oz (109.6 kg)  Height: 5' 2.6" (1.59 m)   Body mass index is 43.35 kg/m.  Advanced Directives 11/07/2017 09/28/2017 08/20/2017 08/17/2017 08/12/2017 08/09/2017 08/05/2017  Does Patient Have a Medical Advance Directive? No No No No No No No  Would patient like information on creating a medical advance directive? - No - Patient declined No - Patient declined No - Patient declined No - Patient declined Yes (MAU/Ambulatory/Procedural Areas - Information given) No - Patient declined  Pre-existing out of facility DNR order (yellow form or pink MOST form) - - - - - - -  Information given  Current Medications (verified) Outpatient Encounter Medications as of 12/21/2017  Medication Sig  . CRANBERRY PO Take 1 tablet by mouth 2 (two) times daily.  Marland Kitchen diltiazem (CARDIZEM) 30 MG tablet TAKE 1 TABLET BY MOUTH EVERY DAY  . ELIQUIS 5 MG TABS tablet TAKE 1 TABLET (5 MG TOTAL) BY MOUTH 2 (TWO) TIMES DAILY.  . flecainide (TAMBOCOR) 150 MG tablet TAKE 0.5 TABLETS (75 MG TOTAL) BY MOUTH 2 (TWO) TIMES DAILY.  . furosemide (LASIX) 20 MG tablet Take 1 tablet (20 mg total) by mouth daily as needed for fluid.  Marland Kitchen glipiZIDE (GLUCOTROL) 5 MG tablet Take 1 tablet (5 mg total) by mouth 2 (two) times daily.  Marland Kitchen  levothyroxine (SYNTHROID, LEVOTHROID) 112 MCG tablet TAKE 1 TABLET BY MOUTH EVERY OTHER DAY, ALTERNATING WITH 1 & 1/2 TABLETS EVERY OTHER DAY  . losartan (COZAAR) 100 MG tablet Take 1 tablet (100 mg total) by mouth daily. (Patient taking differently: Take 50 mg by mouth daily. )  . lovastatin (MEVACOR) 40 MG tablet Take 1 tablet (40 mg total) by mouth every evening.  Marland Kitchen MATZIM LA 180 MG 24 hr tablet TAKE ONE TABLET BY MOUTH ONE TIME DAILY  . metFORMIN (GLUCOPHAGE) 1000 MG tablet Take 1 tablet (1,000 mg total) by mouth 2 (two) times daily with a meal.  . metoprolol succinate (TOPROL-XL) 100 MG 24 hr tablet TAKE 1 TABLET BY MOUTH EVERY DAY  . oxyCODONE-acetaminophen (PERCOCET/ROXICET) 5-325 MG tablet Take 1-2 tablets by mouth every 6 (six) hours as needed for severe pain.  . pantoprazole (PROTONIX) 40 MG tablet TAKE ONE TABLET (40 MG DOSE) BY MOUTH 30 (THIRTY) MINUTES BEFORE BREAKFAST.  Marland Kitchen PRESCRIPTION MEDICATION Apply 1 application topically daily.  . [DISCONTINUED] diltiazem (CARDIZEM) 30 MG tablet 4 hours AS NEEDED for heart rate >100 as long as blood pressure >100.  . [DISCONTINUED] chlorthalidone (HYGROTON) 25 MG tablet TAKE 1 TABLET (25 MG TOTAL) BY MOUTH DAILY.  . [DISCONTINUED] ezetimibe (ZETIA) 10 MG tablet Take 1 tablet (10 mg total) by mouth daily.  . [  DISCONTINUED] ibuprofen (ADVIL,MOTRIN) 200 MG tablet Take 200 mg by mouth daily as needed.    Facility-Administered Encounter Medications as of 12/21/2017  Medication  . cyanocobalamin ((VITAMIN B-12)) injection 1,000 mcg    Allergies (verified) Quinapril hcl; Statins; and Tape   History: Past Medical History:  Diagnosis Date  . Anemia   . Arthritis   . Bilateral carotid artery disease ([place])   . Diabetes mellitus without complication ([place])   . Difficult intubation    states 'lady that did the sleep study told me I have the smallest airway she has ever seen in an adult"  . Dysrhythmia   . Heart murmur   . Hypertension   .  Hypothyroid   . Obesity   . Obstructive sleep apnea    on C Pap  . PAF (paroxysmal atrial fibrillation) ([place])    a. newly dx in 09/2013; on eliquis  . UTI (urinary tract infection)    Past Surgical History:  Procedure Laterality Date  . CATARACT EXTRACTION Left   . CATARACT EXTRACTION W/PHACO Right 12/09/2015   Procedure: CATARACT EXTRACTION PHACO AND INTRAOCULAR LENS PLACEMENT (IOC);  Surgeon: Tonny Branch, MD;  Location: [facility];  Service: Ophthalmology;  Laterality: Right;  CDE:10.48  . COLONOSCOPY W/ POLYPECTOMY    . cyst on back of neck removed    . DILATION AND CURETTAGE OF UTERUS     x 5  . ENDARTERECTOMY Left 08/30/2015   Procedure: LEFT CAROYID ENDARTERECTOMY WITH XENOSURE BOVINE PERICARDIUM PATCH ANGIOPLASTY;  Surgeon: [name], MD;  Location: [facility];  Service: Vascular;  Laterality: Left;  . EYE SURGERY    . TONSILLECTOMY    . UPPER GI ENDOSCOPY     Family History  Problem Relation Age of Onset  . COPD Father   . Heart failure Father   . Heart disease Father   . Emphysema Father   . Arrhythmia Sister   . Arrhythmia Sister   . Arrhythmia Sister        had PPM also  . Cancer Sister    Social History   Socioeconomic History  . Marital status: Single    Spouse name: Not on file  . Number of children: Not on file  . Years of education: Not on file  . Highest education level: Not on file  Occupational History  . Not on file  Social Needs  . Financial resource strain: Not on file  . Food insecurity:    Worry: Not on file    Inability: Not on file  . Transportation needs:    Medical: No    Non-medical: No  Tobacco Use  . Smoking status: Never Smoker  . Smokeless tobacco: Never Used  Substance and Sexual Activity  . Alcohol use: No    Alcohol/week: 0.0 oz  . Drug use: Never  . Sexual activity: Not on file  Lifestyle  . Physical activity:    Days per week: 0 days    Minutes per session: 0 min  . Stress: Not at all  Relationships  . Social  connections:    Talks on phone: Twice a week    Gets together: Twice a week    Attends religious service: More than 4 times per year    Active member of club or organization: No    Attends meetings of clubs or organizations: Never    Relationship status: Never married  Other Topics Concern  . Not on file  Social History Narrative  . Not on  file    Tobacco Counseling Never user  Clinical Intake:  Pre-visit preparation completed: No  Pain : No/denies pain     BMI - recorded: 42.9 Nutritional Status: BMI > 30  Obese Nutritional Risks: None Diabetes: Yes CBG done?: No Did pt. bring in CBG monitor from home?: No  How often do you need to have someone help you when you read instructions, pamphlets, or other written materials from your doctor or pharmacy?: 1 - Never What is the last grade level you completed in school?: Cosmetology  Interpreter Needed?: No  Information entered by :: Truett Mainland, LPN   Activities of Daily Living In your present state of health, do you have any difficulty performing the following activities: 12/21/2017  Hearing? N  Vision? N  Difficulty concentrating or making decisions? N  Walking or climbing stairs? N  Dressing or bathing? N  Doing errands, shopping? N  Some recent data might be hidden  No difficulties with ADLs at this time   Immunizations and Health Maintenance Immunization History  Administered Date(s) Administered  . Influenza, High Dose Seasonal PF 03/24/2016  . Influenza, Seasonal, Injecte, Preservative Fre 06/05/2014, 07/02/2015  . Pneumococcal Conjugate-13 07/09/2015  . Pneumococcal Polysaccharide-23 01/30/2014  . Tdap 05/26/2014  Up to date Health Maintenance Due  Topic Date Due  . Hepatitis C Screening  Jan 20, 1951  . FOOT EXAM  04/02/2017    Patient Care Team: Timmothy Euler, MD as PCP - General (Family Medicine) Minus Breeding, MD as PCP - Cardiology (Cardiology) Minus Breeding, MD as Consulting  Physician (Cardiology) Franchot Gallo, MD as Consulting Physician (Urology) Janie Morning, MD (Gastroenterology) Jonna Clark, MD as Referring Physician (Obstetrics and Gynecology) Bo Merino, MD as Consulting Physician (Rheumatology) [name], MD as Consulting Physician (Vascular Surgery)  Indicate any recent Medical Services you may have received from other than [facility] providers in the past year (date may be approximate).     Assessment:   This is a routine wellness examination for Ardelle.  Hearing/Vision screen No hearing or vision loss at this time  Dietary issues and exercise activities discussed: Current Exercise Habits: The patient does not participate in regular exercise at present  Goals    . Weight (lb) < 200 lb (90.7 kg)     Eat 3 healthy meals daily that consist of lean proteins, fruits, and vegetables Try to exercise for at least 30 minutes, 3 times weekly Increase Water intake       Depression Screen PHQ 2/9 Scores 12/21/2017 11/23/2017 11/09/2017 11/05/2017 11/02/2017 09/10/2017 07/27/2017  PHQ - 2 Score 0 0 1 0 1 0 0    Fall Risk Fall Risk  12/21/2017 11/23/2017 11/09/2017 11/05/2017 11/02/2017  Falls in the past year? No No No No Yes    Is the patient's home free of loose throw rugs in walkways, pet beds, electrical cords, etc?   yes      Grab bars in the bathroom? yes      Handrails on the stairs?   yes      Adequate lighting?   yes  Timed Get Up and Go Performed   Cognitive Function: MMSE - Mini Mental State Exam 12/21/2017  Orientation to time 5  Orientation to Place 5  Registration 3  Attention/ Calculation 5  Recall 3  Language- name 2 objects 2  Language- repeat 1  Language- follow 3 step command 3  Language- read & follow direction 1  Write a sentence 1  Copy design 1  Total score 30    No memory loss noted at this time    Screening Tests Health Maintenance  Topic Date Due  . Hepatitis C Screening  1950-06-04  .  FOOT EXAM  04/02/2017  . INFLUENZA VACCINE  12/30/2017  . HEMOGLOBIN A1C  05/04/2018  . OPHTHALMOLOGY EXAM  06/29/2018  . PNA vac Low Risk Adult (2 of 2 - PPSV23) 01/31/2019  . MAMMOGRAM  04/14/2019  . TETANUS/TDAP  05/26/2024  . COLONOSCOPY  07/03/2024  . DEXA SCAN  Completed    Qualifies for Shingles Vaccine? Declined  Cancer Screenings: Lung: Low Dose CT Chest recommended if Age 23-80 years, 30 pack-year currently smoking OR have quit w/in 15years. Patient does not qualify. Breast: Up to date on Mammogram? Yes   Up to date of Bone Density/Dexa? Yes Colorectal:    Additional Screenings: Hepatitis C Screening:      Plan:   Encouraged to eat 3 healthy meals daily that consist of lean proteins, fruits and vegetables.  Encouraged to increase water intake.  Encouraged to try to exercise for at least 30 minutes, 3 times weekly.  Encouraged to keep all follow up appointments. Encouraged to look over advance directive packet and bring a signed and notarized copy back to this office to be placed in chart.  I have personally reviewed and noted the following in the patient's chart:   . Medical and social history . Use of alcohol, tobacco or illicit drugs  . Current medications and supplements . Functional ability and status . Nutritional status . Physical activity . Advanced directives . List of other physicians . Hospitalizations, surgeries, and ER visits in previous 12 months . Vitals . Screenings to include cognitive, depression, and falls . Referrals and appointments  In addition, I have reviewed and discussed with patient certain preventive protocols, quality metrics, and best practice recommendations. A written personalized care plan for preventive services as well as general preventive health recommendations were provided to patient.     Wardell Heath, LPN   [id number]/4650     I have reviewed and agree with the above AWV documentation.   Laroy Apple, MD [facility] 12/21/2017, 2:39 PM

## 2017-12-22 ENCOUNTER — Other Ambulatory Visit: Payer: Self-pay | Admitting: Cardiology

## 2017-12-22 NOTE — Telephone Encounter (Signed)
Rx sent to pharmacy   

## 2017-12-28 ENCOUNTER — Ambulatory Visit (INDEPENDENT_AMBULATORY_CARE_PROVIDER_SITE_OTHER): Payer: Medicare Other | Admitting: Sports Medicine

## 2017-12-28 ENCOUNTER — Other Ambulatory Visit (HOSPITAL_COMMUNITY): Payer: Medicare Other

## 2017-12-28 ENCOUNTER — Other Ambulatory Visit: Payer: Self-pay

## 2017-12-28 ENCOUNTER — Encounter (HOSPITAL_COMMUNITY): Payer: Self-pay | Admitting: Internal Medicine

## 2017-12-28 ENCOUNTER — Inpatient Hospital Stay (HOSPITAL_BASED_OUTPATIENT_CLINIC_OR_DEPARTMENT_OTHER): Payer: Medicare Other | Admitting: Internal Medicine

## 2017-12-28 VITALS — BP 160/50 | HR 66 | Temp 98.2°F | Resp 18 | Wt 240.3 lb

## 2017-12-28 VITALS — BP 162/50 | Ht 62.5 in | Wt 240.0 lb

## 2017-12-28 DIAGNOSIS — R5382 Chronic fatigue, unspecified: Secondary | ICD-10-CM | POA: Diagnosis not present

## 2017-12-28 DIAGNOSIS — I6523 Occlusion and stenosis of bilateral carotid arteries: Secondary | ICD-10-CM | POA: Diagnosis not present

## 2017-12-28 DIAGNOSIS — M5416 Radiculopathy, lumbar region: Secondary | ICD-10-CM

## 2017-12-28 DIAGNOSIS — D508 Other iron deficiency anemias: Secondary | ICD-10-CM | POA: Diagnosis not present

## 2017-12-28 DIAGNOSIS — E538 Deficiency of other specified B group vitamins: Secondary | ICD-10-CM

## 2017-12-28 DIAGNOSIS — I4891 Unspecified atrial fibrillation: Secondary | ICD-10-CM

## 2017-12-28 DIAGNOSIS — E039 Hypothyroidism, unspecified: Secondary | ICD-10-CM | POA: Diagnosis not present

## 2017-12-28 DIAGNOSIS — I10 Essential (primary) hypertension: Secondary | ICD-10-CM

## 2017-12-28 DIAGNOSIS — Z7901 Long term (current) use of anticoagulants: Secondary | ICD-10-CM | POA: Diagnosis not present

## 2017-12-28 NOTE — Progress Notes (Signed)
Diagnosis Other iron deficiency anemia - Plan: CBC with Differential/Platelet, Comprehensive metabolic panel, Lactate dehydrogenase, Ferritin  Staging Cancer Staging No matching staging information was found for the patient.  Assessment and Plan:  1.  Iron deficiency anemia: Patient was previously followed by Dr. Talbert Cage and has also seen nurse practitioner Renato Battles.  She was previously diagnosed with iron deficiency anemia with a ferritin of 9.  Pt was last treated with IV Feraheme on August 12, 2017.  Labs performed 08/17/2017 showed a white count 10.3 hemoglobin 8 platelets 246,000 creatinine is 0.64.  Ferritin level in February 2019 was 34.    Pt was treated with blood transfusion due to HB 8 in 07/2017.    She has undergone GI evaluation due to dark stools and reports she was told she had a oozing polyp.  She has also had capsule study done in 10/2017 that showed an area of bleed that was cauterized.  She remains on Eliquis.   Labs done 12/21/2017 reviewed with pt and shows WBC 9.7 HB 10.2 MCV 89.5 plts 264,000.  Chemistries WNL, LDH 126, Ferritin 8.  Pt will be set up for IV iron with Injectafer 750 mg IV D1 and D8 due to IDA.  She will RTC in 01/2018 for follow-up and repeat labs after IV iron.  She should continue to follow-up with GI as recommended.   2.  GI Bleeding.  She reports she underwent capsule study and was found to have an area of bleeding in 10/2017 that was cauterized.  HB is 10.2, ferritin 8.  Follow-up with GI as recommended.    3.  Hypertension.  BP is 160/50.  Followup with PCP.    4.  Atrial Fibrillation.  Pt reports she will remain on Eliquis.  Follow-up with cardiology as directed.    5.  Hypothyroidism.  Continue monitoring through PCP.  6.  B12 deficiency.  Continue B12 supplementation as directed.  MMA and B12 levels in 07/2017 were WNL.    7.  Fatigue.  Likely due to IDA.  Will determine if symptoms improve after IV iron.  Continue follow-up with PCP for thyroid  medication monitoring due to hypothyroidism.   Interval History:   Historical data obtained from note dated 09/01/2017.  67 y.o. Female with history of chronic normocytic anemia.  Patient had blood work done on 05/04/2017 which demonstrated WBC 9K, hemoglobin 9.5 g/dL, hematocrit 30.5%, MCV 92, platelet count 280 K.  Hemoglobin on 02/16/2017 was 11.8 g/dL.  Hemoglobin from 1 year ago on 12/04/15 was 9.5 g/dL.  Patient states that she feels chronically fatigued.  She states she had a EGD and colonoscopy performed in [place] 2 years ago and they were both normal.  She states that she has been taking slow release oral iron tablet daily for the past 1 year without major improvement in her anemia.    Labs done in 07/2017 showed a ferritin 110, normal SPEP, haptoglobin of 80 which was WNL, Normal B12 and MMA,  LDH.  Pt has reported history of B12 deficiency.  She denies any gastric bypass surgery.    Current Status: Patient is seen today for follow-up.  She reports she underwent capsule study and was found to have a bleeding area that was cauterized.  She is here to go over labs.    Problem List Patient Active Problem List   Diagnosis Date Noted  . DDD (degenerative disc disease), lumbar [M51.36] 12/07/2017  . Peripheral arterial disease ([place]) [I73.9] 07/06/2017  . Hyperlipidemia [E78.5]  06/29/2017  . Dyslipidemia [E78.5] 06/29/2017  . Iron deficiency anemia [D50.9] 05/27/2017  . Vitamin B12 deficiency [E53.8] 05/27/2017  . Pain in joint, ankle and foot [M25.579] 06/03/2016  . Carotid stenosis [I65.29] 08/30/2015  . Bilateral carotid artery disease ([place]) [I77.9] 07/05/2015  . Acute bronchitis [J20.9] 01/31/2015  . Diabetes mellitus without complication ([place]) [B14.7]   . Hypertension [I10] 10/27/2013  . Hypothyroidism [E03.9] 10/27/2013  . Atrial fibrillation, rapid [facility]) [I48.91] 10/27/2013    Past Medical History Past Medical History:  Diagnosis Date  . Anemia   . Arthritis   . Bilateral  carotid artery disease ([place])   . Diabetes mellitus without complication ([place])   . Difficult intubation    states 'lady that did the sleep study told me I have the smallest airway she has ever seen in an adult"  . Dysrhythmia   . Heart murmur   . Hypertension   . Hypothyroid   . Obesity   . Obstructive sleep apnea    on C Pap  . PAF (paroxysmal atrial fibrillation) ([place])    a. newly dx in 09/2013; on eliquis  . UTI (urinary tract infection)     Past Surgical History Past Surgical History:  Procedure Laterality Date  . CATARACT EXTRACTION Left   . CATARACT EXTRACTION W/PHACO Right 12/09/2015   Procedure: CATARACT EXTRACTION PHACO AND INTRAOCULAR LENS PLACEMENT (IOC);  Surgeon: Tonny Branch, MD;  Location: [facility];  Service: Ophthalmology;  Laterality: Right;  CDE:10.48  . COLONOSCOPY W/ POLYPECTOMY    . cyst on back of neck removed    . DILATION AND CURETTAGE OF UTERUS     x 5  . ENDARTERECTOMY Left 08/30/2015   Procedure: LEFT CAROYID ENDARTERECTOMY WITH XENOSURE BOVINE PERICARDIUM PATCH ANGIOPLASTY;  Surgeon: [name], MD;  Location: [place];  Service: Vascular;  Laterality: Left;  . EYE SURGERY    . TONSILLECTOMY    . UPPER GI ENDOSCOPY      Family History Family History  Problem Relation Age of Onset  . COPD Father   . Heart failure Father   . Heart disease Father   . Emphysema Father   . Arrhythmia Sister   . Arrhythmia Sister   . Arrhythmia Sister        had PPM also  . Cancer Sister      Social History  reports that she has never smoked. She has never used smokeless tobacco. She reports that she does not drink alcohol or use drugs.  Medications  Current Outpatient Medications:  .  CRANBERRY PO, Take 1 tablet by mouth 2 (two) times daily., Disp: , Rfl:  .  diltiazem (CARDIZEM) 30 MG tablet, TAKE 1 TABLET BY MOUTH EVERY DAY, Disp: 90 tablet, Rfl: 1 .  ELIQUIS 5 MG TABS tablet, TAKE 1 TABLET (5 MG TOTAL) BY MOUTH 2 (TWO) TIMES DAILY., Disp: 60 tablet, Rfl: 5 .   flecainide (TAMBOCOR) 150 MG tablet, TAKE 0.5 TABLETS (75 MG TOTAL) BY MOUTH 2 (TWO) TIMES DAILY., Disp: 90 tablet, Rfl: 1 .  furosemide (LASIX) 20 MG tablet, Take 1 tablet (20 mg total) by mouth daily as needed for fluid., Disp: 90 tablet, Rfl: 0 .  glipiZIDE (GLUCOTROL) 5 MG tablet, Take 1 tablet (5 mg total) by mouth 2 (two) times daily., Disp: 180 tablet, Rfl: 0 .  levothyroxine (SYNTHROID, LEVOTHROID) 112 MCG tablet, TAKE 1 TABLET BY MOUTH EVERY OTHER DAY, ALTERNATING WITH 1 & 1/2 TABLETS EVERY OTHER DAY, Disp: 45 tablet, Rfl: 3 .  losartan (COZAAR) 100 MG tablet, Take 1 tablet (100 mg total) by mouth daily. (Patient taking differently: Take 50 mg by mouth daily. ), Disp: 90 tablet, Rfl: 3 .  lovastatin (MEVACOR) 40 MG tablet, Take 1 tablet (40 mg total) by mouth every evening., Disp: 90 tablet, Rfl: 3 .  MATZIM LA 180 MG 24 hr tablet, TAKE ONE TABLET BY MOUTH ONE TIME DAILY, Disp: 60 tablet, Rfl: 11 .  metFORMIN (GLUCOPHAGE) 1000 MG tablet, Take 1 tablet (1,000 mg total) by mouth 2 (two) times daily with a meal., Disp: 180 tablet, Rfl: 3 .  metoprolol succinate (TOPROL-XL) 100 MG 24 hr tablet, TAKE 1 TABLET BY MOUTH EVERY DAY, Disp: 90 tablet, Rfl: 1 .  oxyCODONE-acetaminophen (PERCOCET/ROXICET) 5-325 MG tablet, Take 1-2 tablets by mouth every 6 (six) hours as needed for severe pain., Disp: 30 tablet, Rfl: 0 .  pantoprazole (PROTONIX) 40 MG tablet, TAKE ONE TABLET (40 MG DOSE) BY MOUTH 30 (THIRTY) MINUTES BEFORE BREAKFAST., Disp: , Rfl: 5 .  PRESCRIPTION MEDICATION, Apply 1 application topically daily., Disp: , Rfl:  .  rOPINIRole (REQUIP) 0.5 MG tablet, Take 0.5-2 tablets (0.25-1 mg total) by mouth at bedtime., Disp: 60 tablet, Rfl: 1  Current Facility-Administered Medications:  .  cyanocobalamin ((VITAMIN B-12)) injection 1,000 mcg, 1,000 mcg, Intramuscular, Q30 days, Timmothy Euler, MD, 1,000 mcg at 12/21/17 1009  Allergies Quinapril hcl; Statins; and Tape  Review of Systems Review  of Systems - Oncology ROS negative other than fatigue   Physical Exam  Vitals Wt Readings from Last 3 Encounters:  12/28/17 240 lb 4.8 oz (109 kg)  12/21/17 241 lb 3.2 oz (109.4 kg)  12/21/17 241 lb 9.6 oz (109.6 kg)   Temp Readings from Last 3 Encounters:  12/28/17 98.2 F (36.8 C) (Oral)  12/21/17 98.1 F (36.7 C) (Oral)  11/23/17 98.3 F (36.8 C) (Oral)   BP Readings from Last 3 Encounters:  12/28/17 (!) 160/50  12/21/17 (!) 147/57  12/21/17 (!) 143/62   Pulse Readings from Last 3 Encounters:  12/28/17 66  12/21/17 82  12/21/17 82   Constitutional: Well-developed, well-nourished, and in no distress.   HENT: Head: Normocephalic and atraumatic.  Mouth/Throat: No oropharyngeal exudate. Mucosa moist. Eyes: Pupils are equal, round, and reactive to light. Conjunctivae are normal. No scleral icterus.  Neck: Normal range of motion. Neck supple. No JVD present.  Cardiovascular: Normal rate, regular rhythm and normal heart sounds.  Exam reveals no gallop and no friction rub.   No murmur heard. Pulmonary/Chest: Effort normal and breath sounds normal. No respiratory distress. No wheezes.No rales.  Abdominal: Soft. Bowel sounds are normal. No distension. There is no tenderness. There is no guarding.  Musculoskeletal: No edema or tenderness.  Lymphadenopathy: No cervical, axillary or supraclavicular adenopathy.  Neurological: Alert and oriented to person, place, and time. No cranial nerve deficit.  Skin: Skin is warm and dry. No rash noted. No erythema. No pallor.  Psychiatric: Affect and judgment normal.   Labs No visits with results within 3 Day(s) from this visit.  Latest known visit with results is:  Appointment on 12/21/2017  Component Date Value Ref Range Status  . WBC 12/21/2017 9.7  4.0 - 10.5 K/uL Final  . RBC 12/21/2017 3.62* 3.87 - 5.11 MIL/uL Final  . Hemoglobin 12/21/2017 10.2* 12.0 - 15.0 g/dL Final  . HCT 12/21/2017 32.4* 36.0 - 46.0 % Final  . MCV 12/21/2017  89.5  78.0 - 100.0 fL Final  . MCH 12/21/2017 28.2  26.0 -  34.0 pg Final  . MCHC 12/21/2017 31.5  30.0 - 36.0 g/dL Final  . RDW 12/21/2017 14.9  11.5 - 15.5 % Final  . Platelets 12/21/2017 264  150 - 400 K/uL Final  . Neutrophils Relative % 12/21/2017 77  % Final  . Neutro Abs 12/21/2017 7.5  1.7 - 7.7 K/uL Final  . Lymphocytes Relative 12/21/2017 11  % Final  . Lymphs Abs 12/21/2017 1.1  0.7 - 4.0 K/uL Final  . Monocytes Relative 12/21/2017 8  % Final  . Monocytes Absolute 12/21/2017 0.7  0.1 - 1.0 K/uL Final  . Eosinophils Relative 12/21/2017 3  % Final  . Eosinophils Absolute 12/21/2017 0.3  0.0 - 0.7 K/uL Final  . Basophils Relative 12/21/2017 1  % Final  . Basophils Absolute 12/21/2017 0.1  0.0 - 0.1 K/uL Final   Performed at [facility], [address] [place], [place] [address]  . Sodium 12/21/2017 134* 135 - 145 mmol/L Final  . Potassium 12/21/2017 3.7  3.5 - 5.1 mmol/L Final  . Chloride 12/21/2017 98  98 - 111 mmol/L Final  . CO2 12/21/2017 28  22 - 32 mmol/L Final  . Glucose, Bld 12/21/2017 272* 70 - 99 mg/dL Final  . BUN 12/21/2017 24* 8 - 23 mg/dL Final  . Creatinine, Ser 12/21/2017 0.72  0.44 - 1.00 mg/dL Final  . Calcium 12/21/2017 9.0  8.9 - 10.3 mg/dL Final  . Total Protein 12/21/2017 7.5  6.5 - 8.1 g/dL Final  . Albumin 12/21/2017 4.0  3.5 - 5.0 g/dL Final  . AST 12/21/2017 24  15 - 41 U/L Final  . ALT 12/21/2017 19  0 - 44 U/L Final  . Alkaline Phosphatase 12/21/2017 91  38 - 126 U/L Final  . Total Bilirubin 12/21/2017 0.7  0.3 - 1.2 mg/dL Final  . GFR calc non Af Amer 12/21/2017 >60  >60 mL/min Final  . GFR calc Af Amer 12/21/2017 >60  >60 mL/min Final   Comment: (NOTE) The eGFR has been calculated using the CKD EPI equation. This calculation has not been validated in all clinical situations. eGFR's persistently <60 mL/min signify possible Chronic Kidney Disease.   Georgiann Hahn gap 12/21/2017 8  5 - 15 Final   Performed at [facility], [address]  [place], [place] [address]  . LDH 12/21/2017 126  98 - 192 U/L Final   Performed at [facility], [address], [place], [place] [address]  . Ferritin 12/21/2017 8* 11 - 307 ng/mL Final   Performed at [facility], [place] [address], [place], [place] [address]     Pathology Orders Placed This Encounter  Procedures  . CBC with Differential/Platelet    Standing Status:   Future    Standing Expiration Date:   12/29/2018  . Comprehensive metabolic panel    Standing Status:   Future    Standing Expiration Date:   12/29/2018  . Lactate dehydrogenase    Standing Status:   Future    Standing Expiration Date:   12/29/2018  . Ferritin    Standing Status:   Future    Standing Expiration Date:   12/29/2018       Zoila Shutter MD

## 2017-12-29 ENCOUNTER — Encounter: Payer: Self-pay | Admitting: Sports Medicine

## 2017-12-29 NOTE — Progress Notes (Signed)
   Subjective:    Patient ID: Emily [name], female    DOB: [birth year], 67 y.o.   MRN: [id number]  HPI chief complaint: Left leg pain  Very pleasant 66 year old female comes in today complaining of several weeks of lower left leg pain. She denies any trauma but does endorse an acute onset of pain that awoke her from sleeping one night. She describes the pain as aching in quality and involves primarily the left lower leg and ankle. She notes that walking seems to help her pain. 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 She's had problems with her back in the past. In fact, she had similar symptoms in the right leg in 2017 which resolved spontaneously over a period of several weeks. She does describe weakness in that right leg but does not currently endorse weakness in her left leg.She denies numbness or tingling. She denies pain in her groin. She denies left knee pain.no significant pain with sitting. She has tried some topical over-the-counter medication which has been somewhat helpful. She has also tried tumeric which has been helpful.She is referred here today by her PCP for further evaluation and treatment. Overall, she feels like her symptoms are improving but certainly have not completely resolved.  Past medical history reviewed. She has a history of diabetes, hypertension, and atrial fibrillation Medications are reviewed. Significant for Eliquis Allergies reviewed She is self-employed as a Theme park manager    Review of Systems As above    Objective:   Physical Exam  Well-developed, well-nourished. No acute distress. She is sitting In exam room.  Lumbar spine: Fairly good lumbar range of motion. No tenderness to palpation along the lumbar midline nor along the paraspinal muscle structure.  Left hip: Smooth painless hip range of motion with a negative logroll.  Left  knee: Full range of motion. No tenderness to palpation. Good stability. No effusion.  Neurological exam: Negative straight leg raise bilaterally. 4/5 strength with resisted great toe extension on the left compared to 5/5 on the right. Remainder of the strength is 5/5 in both lower extremity's.Sensation is intact to light touch grossly. No atrophy. Reflexes are trace but equal at the Achilles and patellar tendons bilaterally.  X-rays of her lumbar spine dated 11/23/2017 are reviewed. She has degenerative disc disease at L2-L3, L3-L4, and L4-L5. She also has a stable anteriorolisthesis of L4 on L5 as well as diffuse facet arthropathy. Nothing acute is seen.      Assessment & Plan:  Left leg pain secondary to lumbar radiculopathy with x-ray evidence of L4-L5 anterior listhesis  Since the patient's symptoms are already improving we are going to take a watchful waiting approach. Hopefully it will resolve on its own just as it did in 2017. I did give her a couple of easy extension exercises to try but instructed her to discontinue them if she thought it was making her symptoms worse. I would like to reevaluate her in about 6 weeks. I discussed the merits of further diagnostic imaging in anticipation of lumbar epidural steroid injections if her symptoms did not continue to improve. Since she cannot take NSAIDs (on Eliquis) she will stick with her tumeric and topical pain reliever. Patient will call me with questions or concerns prior to her follow-up visit.

## 2018-01-04 NOTE — Progress Notes (Signed)
Office Visit Note  Patient: Emily [name]             Date of Birth: [birth year]           MRN: [id number]             PCP: Janora Norlander, DO Referring: Timmothy Euler, MD Visit Date: 01/18/2018 Occupation: @GUAROCC @  Subjective:  Leg pain.   History of Present Illness: Emily [name] is a 67 y.o. female history of osteoarthritis.  She does have discomfort in her bilateral hands and knee joints from underlying osteoarthritis.  She states she was having some discomfort of chin but the symptoms have improved.  She also had some discomfort over trochanteric area which is better.  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  She denies any joint swelling.  He has not tried any exercises or supplements yet.  Activities of Daily Living:  Patient reports morning stiffness for 5  minutes.   Patient Denies nocturnal pain.  Difficulty dressing/grooming: Denies Difficulty climbing stairs: Reports Difficulty getting out of chair: Denies Difficulty using hands for taps, buttons, cutlery, and/or writing: Denies  Review of Systems  Constitutional: Negative for fatigue, night sweats, weight gain and weight loss.  HENT: Positive for mouth dryness. Negative for mouth sores, trouble swallowing, trouble swallowing and nose dryness.   Eyes: Negative for pain, redness, itching, visual disturbance and dryness.  Respiratory: Negative for cough, shortness of breath and difficulty breathing.   Cardiovascular: Negative for chest pain, palpitations, hypertension, irregular heartbeat and swelling in legs/feet.  Gastrointestinal: Negative for abdominal pain, blood in stool, constipation, diarrhea, nausea and vomiting.  Endocrine: Negative for increased urination.  Genitourinary: Negative for pelvic pain and vaginal dryness.  Musculoskeletal: Positive for arthralgias, joint pain and morning stiffness. Negative for joint swelling, myalgias, muscle weakness, muscle  tenderness and myalgias.  Skin: Negative for color change, rash, hair loss, skin tightness, ulcers and sensitivity to sunlight.  Allergic/Immunologic: Negative for susceptible to infections.  Neurological: Negative for dizziness, light-headedness, headaches, memory loss, night sweats and weakness.  Hematological: Negative for bruising/bleeding tendency and swollen glands.  Psychiatric/Behavioral: Negative for depressed mood, confusion and sleep disturbance. The patient is not nervous/anxious.     PMFS History:  Patient Active Problem List   Diagnosis Date Noted  . DDD (degenerative disc disease), lumbar 12/07/2017  . Peripheral arterial disease ([place]) 07/06/2017  . Hyperlipidemia 06/29/2017  . Dyslipidemia 06/29/2017  . Iron deficiency anemia 05/27/2017  . Vitamin B12 deficiency 05/27/2017  . Pain in joint, ankle and foot 06/03/2016  . Carotid stenosis 08/30/2015  . Bilateral carotid artery disease ([place]) 07/05/2015  . Acute bronchitis 01/31/2015  . Diabetes mellitus without complication ([place])   . Hypertension 10/27/2013  . Hypothyroidism 10/27/2013  . Atrial fibrillation, rapid ([place]) 10/27/2013    Past Medical History:  Diagnosis Date  . Anemia   . Arthritis   . Bilateral carotid artery disease ([place])   . Diabetes mellitus without complication ([place])   . Difficult intubation    states 'lady that did the sleep study told me I have the smallest airway she has ever seen in an adult"  . Dysrhythmia   . Heart murmur   . Hypertension   . Hypothyroid   . Obesity   . Obstructive sleep apnea    on C Pap  . PAF (paroxysmal atrial fibrillation) ([place])    a. newly dx in 09/2013; on eliquis  . UTI (urinary tract infection)  Family History  Problem Relation Age of Onset  . COPD Father   . Heart failure Father   . Heart disease Father   . Emphysema Father   . Arrhythmia Sister   . Arrhythmia Sister   . Arrhythmia Sister        had PPM also  . Cancer Sister    Past Surgical  History:  Procedure Laterality Date  . CATARACT EXTRACTION Left   . CATARACT EXTRACTION W/PHACO Right 12/09/2015   Procedure: CATARACT EXTRACTION PHACO AND INTRAOCULAR LENS PLACEMENT (IOC);  Surgeon: Tonny Branch, MD;  Location: [facility];  Service: Ophthalmology;  Laterality: Right;  CDE:10.48  . COLONOSCOPY W/ POLYPECTOMY    . cyst on back of neck removed    . DILATION AND CURETTAGE OF UTERUS     x 5  . ENDARTERECTOMY Left 08/30/2015   Procedure: LEFT CAROYID ENDARTERECTOMY WITH XENOSURE BOVINE PERICARDIUM PATCH ANGIOPLASTY;  Surgeon: [name], MD;  Location: [place];  Service: Vascular;  Laterality: Left;  . EYE SURGERY    . TONSILLECTOMY    . UPPER GI ENDOSCOPY     Social History   Social History Narrative  . Not on file    Objective: Vital Signs: BP (!) 154/66 (BP Location: Left Arm, Patient Position: Sitting, Cuff Size: Normal)   Pulse (!) 58   Resp 16   Ht 5' 2.5" (1.588 m)   Wt 239 lb 6.4 oz (108.6 kg)   BMI 43.09 kg/m    Physical Exam  Constitutional: She is oriented to person, place, and time. She appears well-developed and well-nourished.  HENT:  Head: Normocephalic and atraumatic.  Eyes: Conjunctivae and EOM are normal.  Neck: Normal range of motion.  Cardiovascular: Normal rate, regular rhythm and intact distal pulses.  Murmur heard. Pulmonary/Chest: Effort normal and breath sounds normal.  Abdominal: Soft. Bowel sounds are normal.  Lymphadenopathy:    She has no cervical adenopathy.  Neurological: She is alert and oriented to person, place, and time.  Skin: Skin is warm and dry. Capillary refill takes less than 2 seconds.  Psychiatric: She has a normal mood and affect. Her behavior is normal.  Nursing note and vitals reviewed.    Musculoskeletal Exam: C-spine good range of motion.  She has some discomfort range of motion of lumbar spine.  Shoulder joints and elbow joints were in good range of motion.  She has DIP and PIP thickening in her hands.  She is  crepitus in her bilateral knee joints.  All other joints full range of motion with no synovitis.  CDAI Exam: No CDAI exam completed.   Investigation: No additional findings.  Imaging: No results found.  Recent Labs: Lab Results  Component Value Date   WBC 9.7 12/21/2017   HGB 10.2 (L) 12/21/2017   PLT 264 12/21/2017   NA 134 (L) 12/21/2017   K 3.7 12/21/2017   CL 98 12/21/2017   CO2 28 12/21/2017   GLUCOSE 272 (H) 12/21/2017   BUN 24 (H) 12/21/2017   CREATININE 0.72 12/21/2017   BILITOT 0.7 12/21/2017   ALKPHOS 91 12/21/2017   AST 24 12/21/2017   ALT 19 12/21/2017   PROT 7.5 12/21/2017   ALBUMIN 4.0 12/21/2017   CALCIUM 9.0 12/21/2017   GFRAA >60 12/21/2017    Speciality Comments: No specialty comments available.  Procedures:  No procedures performed Allergies: Quinapril hcl; Statins; and Tape   Assessment / Plan:     Visit Diagnoses: Primary osteoarthritis of both hands-joint protection muscle  strengthening was discussed.  She has not a started doing exercises yet.  Primary osteoarthritis of both knees - Bilateral moderate with moderate chondromalacia patella-weight loss diet and exercise was discussed.  Lower extremity muscle strengthening exercises were discussed.  She does have a handout which was given during the last visit.  Also also advised her to use the natural anti-inflammatories.  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  Weight loss will be helpful.  Other medical problems are listed as follows:  Atrial fibrillation, rapid ([place])  Bilateral carotid artery stenosis  Diabetes mellitus without complication ([place])  Dyslipidemia  Essential hypertension  Hypothyroidism, unspecified type  Vitamin B12 deficiency  Peripheral arterial disease ([facility])  Iron deficiency anemia, unspecified iron deficiency anemia type   Orders: No orders of the defined types were placed  in this encounter.  No orders of the defined types were placed in this encounter.   Follow-Up Instructions: Return if symptoms worsen or fail to improve, for Osteoarthritis, DDD.   Bo Merino, MD  Note - This record has been created using Editor, commissioning.  Chart creation errors have been sought, but may not always  have been located. Such creation errors do not reflect on  the standard of medical care.

## 2018-01-05 ENCOUNTER — Inpatient Hospital Stay (HOSPITAL_COMMUNITY): Payer: Medicare Other | Attending: Hematology

## 2018-01-05 ENCOUNTER — Ambulatory Visit: Payer: Medicare Other | Admitting: Cardiology

## 2018-01-05 VITALS — BP 134/50 | HR 58 | Temp 97.8°F | Resp 16

## 2018-01-05 DIAGNOSIS — D509 Iron deficiency anemia, unspecified: Secondary | ICD-10-CM

## 2018-01-05 DIAGNOSIS — D508 Other iron deficiency anemias: Secondary | ICD-10-CM | POA: Insufficient documentation

## 2018-01-05 MED ORDER — FERRIC CARBOXYMALTOSE 750 MG/15ML IV SOLN
750.0000 mg | Freq: Once | INTRAVENOUS | Status: AC
  Administered 2018-01-05: 750 mg via INTRAVENOUS
  Filled 2018-01-05: qty 15

## 2018-01-05 MED ORDER — SODIUM CHLORIDE 0.9 % IV SOLN
INTRAVENOUS | Status: DC
  Administered 2018-01-05: 15:00:00 via INTRAVENOUS

## 2018-01-07 ENCOUNTER — Other Ambulatory Visit: Payer: Self-pay

## 2018-01-07 ENCOUNTER — Encounter (HOSPITAL_COMMUNITY): Payer: Self-pay

## 2018-01-07 NOTE — Progress Notes (Signed)
Treatment given per orders. Patient tolerated it well without problems. Vitals stable and discharged home from clinic ambulatory. Follow up as scheduled.  

## 2018-01-08 ENCOUNTER — Other Ambulatory Visit: Payer: Self-pay | Admitting: Cardiology

## 2018-01-12 ENCOUNTER — Inpatient Hospital Stay (HOSPITAL_COMMUNITY): Payer: Medicare Other

## 2018-01-12 ENCOUNTER — Encounter (HOSPITAL_COMMUNITY): Payer: Self-pay

## 2018-01-12 VITALS — BP 113/42 | HR 61 | Temp 98.3°F | Resp 18

## 2018-01-12 DIAGNOSIS — D508 Other iron deficiency anemias: Secondary | ICD-10-CM | POA: Diagnosis not present

## 2018-01-12 DIAGNOSIS — D509 Iron deficiency anemia, unspecified: Secondary | ICD-10-CM

## 2018-01-12 MED ORDER — SODIUM CHLORIDE 0.9 % IV SOLN
750.0000 mg | Freq: Once | INTRAVENOUS | Status: AC
  Administered 2018-01-12: 750 mg via INTRAVENOUS
  Filled 2018-01-12: qty 15

## 2018-01-12 MED ORDER — SODIUM CHLORIDE 0.9 % IV SOLN
INTRAVENOUS | Status: DC
  Administered 2018-01-12: 14:00:00 via INTRAVENOUS

## 2018-01-12 NOTE — Patient Instructions (Signed)
[facility] at [facility] Discharge Instructions  Received Injectafer infusion today. Follow-up as scheduled. Call clinic for any questions or concerns   Thank you for choosing [facility] at [facility] to provide your oncology and hematology care.  To afford each patient quality time with our provider, please arrive at least 15 minutes before your scheduled appointment time.   If you have a lab appointment with the Cancer Center please come in thru the  Main Entrance and check in at the main information desk  You need to re-schedule your appointment should you arrive 10 or more minutes late.  We strive to give you quality time with our providers, and arriving late affects you and other patients whose appointments are after yours.  Also, if you no show three or more times for appointments you may be dismissed from the clinic at the providers discretion.     Again, thank you for choosing [facility].  Our hope is that these requests will decrease the amount of time that you wait before being seen by our physicians.       _____________________________________________________________  Should you have questions after your visit to [facility], please contact our office at [phone] between the hours of 8:00 a.m. and 4:30 p.m.  Voicemails left after 4:00 p.m. will not be returned until the following business day.  For prescription refill requests, have your pharmacy contact our office and allow 72 hours.    Cancer Center Support Programs:   > Cancer Support Group  2nd Tuesday of the month 1pm-2pm, Journey Room   

## 2018-01-12 NOTE — Progress Notes (Signed)
Emily [name] tolerated Injectafer infusion well without complaints or incident. VSS upon discharge. Pt discharged self ambulatory in satisfactory condition

## 2018-01-18 ENCOUNTER — Ambulatory Visit (INDEPENDENT_AMBULATORY_CARE_PROVIDER_SITE_OTHER): Payer: Medicare Other | Admitting: Rheumatology

## 2018-01-18 ENCOUNTER — Encounter: Payer: Self-pay | Admitting: Rheumatology

## 2018-01-18 VITALS — BP 154/66 | HR 58 | Resp 16 | Ht 62.5 in | Wt 239.4 lb

## 2018-01-18 DIAGNOSIS — E538 Deficiency of other specified B group vitamins: Secondary | ICD-10-CM

## 2018-01-18 DIAGNOSIS — E119 Type 2 diabetes mellitus without complications: Secondary | ICD-10-CM

## 2018-01-18 DIAGNOSIS — I4891 Unspecified atrial fibrillation: Secondary | ICD-10-CM

## 2018-01-18 DIAGNOSIS — M19041 Primary osteoarthritis, right hand: Secondary | ICD-10-CM

## 2018-01-18 DIAGNOSIS — I6523 Occlusion and stenosis of bilateral carotid arteries: Secondary | ICD-10-CM

## 2018-01-18 DIAGNOSIS — D509 Iron deficiency anemia, unspecified: Secondary | ICD-10-CM

## 2018-01-18 DIAGNOSIS — M19042 Primary osteoarthritis, left hand: Secondary | ICD-10-CM | POA: Diagnosis not present

## 2018-01-18 DIAGNOSIS — E039 Hypothyroidism, unspecified: Secondary | ICD-10-CM

## 2018-01-18 DIAGNOSIS — M170 Bilateral primary osteoarthritis of knee: Secondary | ICD-10-CM | POA: Diagnosis not present

## 2018-01-18 DIAGNOSIS — M51369 Other intervertebral disc degeneration, lumbar region without mention of lumbar back pain or lower extremity pain: Secondary | ICD-10-CM

## 2018-01-18 DIAGNOSIS — I739 Peripheral vascular disease, unspecified: Secondary | ICD-10-CM | POA: Diagnosis not present

## 2018-01-18 DIAGNOSIS — E785 Hyperlipidemia, unspecified: Secondary | ICD-10-CM

## 2018-01-18 DIAGNOSIS — M5136 Other intervertebral disc degeneration, lumbar region: Secondary | ICD-10-CM

## 2018-01-18 DIAGNOSIS — I10 Essential (primary) hypertension: Secondary | ICD-10-CM

## 2018-01-25 ENCOUNTER — Ambulatory Visit (INDEPENDENT_AMBULATORY_CARE_PROVIDER_SITE_OTHER): Payer: Medicare Other | Admitting: *Deleted

## 2018-01-25 DIAGNOSIS — E538 Deficiency of other specified B group vitamins: Secondary | ICD-10-CM | POA: Diagnosis not present

## 2018-01-25 NOTE — Progress Notes (Signed)
Pt given cyanocobalamin inj Tolerated well 

## 2018-02-08 ENCOUNTER — Ambulatory Visit (INDEPENDENT_AMBULATORY_CARE_PROVIDER_SITE_OTHER): Payer: Medicare Other | Admitting: Urology

## 2018-02-08 DIAGNOSIS — N3000 Acute cystitis without hematuria: Secondary | ICD-10-CM | POA: Diagnosis not present

## 2018-02-08 DIAGNOSIS — N952 Postmenopausal atrophic vaginitis: Secondary | ICD-10-CM | POA: Diagnosis not present

## 2018-02-12 NOTE — Progress Notes (Signed)
HPI The patient presents for followup of atrial fib.   The patient has paroxysmal atrial fibrillation and is being treated with flecainide. Since I last saw her she has done OK.  She has had no sustained palpitations such as she had in April.  The patient denies any new symptoms such as chest discomfort, neck or arm discomfort. There has been no new shortness of breath, PND or orthopnea. There have been no reported palpitations, presyncope or syncope.  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     Allergies  Allergen Reactions  . Quinapril Hcl Cough  . Statins Other (See Comments)    Not all Statins but some cause cough and pain in legs.  . Tape Other (See Comments)    Redness, please use "paper" tape    Current Outpatient Medications  Medication Sig Dispense Refill  . chlorthalidone (HYGROTON) 25 MG tablet Take 25 mg by mouth daily.  2  . CRANBERRY PO Take 1 tablet by mouth 2 (two) times daily.    Marland Kitchen diltiazem (CARDIZEM) 30 MG tablet TAKE 1 TABLET BY MOUTH EVERY DAY 90 tablet 1  . ELIQUIS 5 MG TABS tablet TAKE 1 TABLET (5 MG TOTAL) BY MOUTH 2 (TWO) TIMES DAILY. 60 tablet 8  . ezetimibe (ZETIA) 10 MG tablet Take 10 mg by mouth daily.    . flecainide (TAMBOCOR) 150 MG tablet TAKE 0.5 TABLETS (75 MG TOTAL) BY MOUTH 2 (TWO) TIMES DAILY. 90 tablet 1  . furosemide (LASIX) 20 MG tablet Take 1 tablet (20 mg total) by mouth daily as needed for fluid. 90 tablet 0  . glipiZIDE (GLUCOTROL) 5 MG tablet Take 1 tablet (5 mg total) by mouth 2 (two) times daily. 180 tablet 0  . levothyroxine (SYNTHROID, LEVOTHROID) 112 MCG tablet TAKE 1 TABLET BY MOUTH EVERY OTHER DAY, ALTERNATING WITH 1 & 1/2 TABLETS EVERY OTHER DAY 45 tablet 3  . losartan (COZAAR) 100 MG tablet Take 1 tablet (100 mg total) by mouth daily. (Patient taking differently: Take 50 mg by mouth daily. ) 90 tablet 3  . lovastatin (MEVACOR) 40 MG tablet Take 1 tablet (40 mg total) by mouth every evening. 90 tablet 3  . MATZIM LA 180 MG 24 hr  tablet TAKE ONE TABLET BY MOUTH ONE TIME DAILY 60 tablet 11  . metFORMIN (GLUCOPHAGE) 1000 MG tablet Take 1 tablet (1,000 mg total) by mouth 2 (two) times daily with a meal. 180 tablet 3  . metoprolol succinate (TOPROL-XL) 100 MG 24 hr tablet TAKE 1 TABLET BY MOUTH EVERY DAY 90 tablet 1  . oxyCODONE-acetaminophen (PERCOCET/ROXICET) 5-325 MG tablet Take 1-2 tablets by mouth every 6 (six) hours as needed for severe pain. 30 tablet 0  . PRESCRIPTION MEDICATION Apply 1 application topically daily.    Marland Kitchen rOPINIRole (REQUIP) 0.5 MG tablet Take 0.5-2 tablets (0.25-1 mg total) by mouth at bedtime. 60 tablet 1   Current Facility-Administered Medications  Medication Dose Route Frequency Provider Last Rate Last Dose  . cyanocobalamin ((VITAMIN B-12)) injection 1,000 mcg  1,000 mcg Intramuscular Q30 days Timmothy Euler, MD   1,000 mcg at 01/25/18 1025    Past Medical History:  Diagnosis Date  . Anemia   . Arthritis   . Bilateral carotid artery disease ([place])   . Diabetes mellitus without complication ([place])   . Difficult intubation    states 'lady that did the sleep study told me I have the smallest airway she has ever seen in an adult"  .  Dysrhythmia   . Heart murmur   . Hypertension   . Hypothyroid   . Obesity   . Obstructive sleep apnea    on C Pap  . PAF (paroxysmal atrial fibrillation) ([place])    a. newly dx in 09/2013; on eliquis  . UTI (urinary tract infection)     Past Surgical History:  Procedure Laterality Date  . CATARACT EXTRACTION Left   . CATARACT EXTRACTION W/PHACO Right 12/09/2015   Procedure: CATARACT EXTRACTION PHACO AND INTRAOCULAR LENS PLACEMENT (IOC);  Surgeon: Tonny Branch, MD;  Location: [facility];  Service: Ophthalmology;  Laterality: Right;  CDE:10.48  . COLONOSCOPY W/ POLYPECTOMY    . cyst on back of neck removed    . DILATION AND CURETTAGE OF UTERUS     x 5  . ENDARTERECTOMY Left 08/30/2015   Procedure: LEFT CAROYID ENDARTERECTOMY WITH XENOSURE BOVINE PERICARDIUM PATCH  ANGIOPLASTY;  Surgeon: [name], MD;  Location: [facility];  Service: Vascular;  Laterality: Left;  . EYE SURGERY    . TONSILLECTOMY    . UPPER GI ENDOSCOPY      ROS:  As stated in the HPI and negative for all other systems.  PHYSICAL EXAM BP (!) 148/72   Pulse 66   Ht 5' 2.5" (1.588 m)   Wt 238 lb 3.2 oz (108 kg)   SpO2 99%   BMI 42.87 kg/m   GENERAL:  Well appearing NECK:  No jugular venous distention, waveform within normal limits, carotid upstroke brisk and symmetric, no bruits, no thyromegaly LUNGS:  Clear to auscultation bilaterally CHEST:  Unremarkable HEART:  PMI not displaced or sustained,S1 and S2 within normal limits, no S3, no S4, no clicks, no rubs, 3/6 right upper sternal border brief systolic murmur, no diastolic  murmurs ABD:  Flat, positive bowel sounds normal in frequency in pitch, no bruits, no rebound, no guarding, no midline pulsatile mass, no hepatomegaly, no splenomegaly EXT:  2 plus pulses throughout, mild ankle edema, no cyanosis no clubbing   EKG: NA   ASSESSMENT AND PLAN  ATRIAL FIB:   .  [name] has a CHA2DS2 - VASc score of 4 with a risk of stroke of 4%.  No change in therapy.   She is status post DCCV in April.   She can now hold her Eliquis as needed for colonoscopy.  She is at acceptable risk for this procedure.  I will have her come back for a trough flecainide level.  HTN:  BP is mildly elevated on BP diary.  Rather than add a fourth blood pressure medication talk to her about having a 5 pound weight loss.  She does not want to speak to a nutritionist.  She is going to see her new primary care physician soon and I would suggest continued education about weight loss and diet to talk about specifics.  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  She is having follow-up Doppler today.  OBESITY:   We went through specific diet instructions.  Her eating habits are not good.   DM: A1c is 8.1.  She has follow-up  with her new primary care physician and I told her this was in my estimation her most significant issue that needs to be addressed currently.  I will defer to Janora Norlander, DO

## 2018-02-14 ENCOUNTER — Ambulatory Visit (INDEPENDENT_AMBULATORY_CARE_PROVIDER_SITE_OTHER): Payer: Medicare Other | Admitting: Cardiology

## 2018-02-14 ENCOUNTER — Ambulatory Visit (HOSPITAL_COMMUNITY)
Admission: RE | Admit: 2018-02-14 | Discharge: 2018-02-14 | Disposition: A | Discharge status: Home / Self Care | Payer: Medicare Other | Source: Ambulatory Visit | Attending: Surgery | Admitting: Surgery

## 2018-02-14 ENCOUNTER — Encounter: Payer: Self-pay | Admitting: Family

## 2018-02-14 ENCOUNTER — Ambulatory Visit (INDEPENDENT_AMBULATORY_CARE_PROVIDER_SITE_OTHER): Payer: Medicare Other | Admitting: Sports Medicine

## 2018-02-14 ENCOUNTER — Encounter: Payer: Self-pay | Admitting: Cardiology

## 2018-02-14 ENCOUNTER — Other Ambulatory Visit: Payer: Self-pay

## 2018-02-14 ENCOUNTER — Ambulatory Visit (INDEPENDENT_AMBULATORY_CARE_PROVIDER_SITE_OTHER): Payer: Medicare Other | Admitting: Family

## 2018-02-14 VITALS — BP 151/67 | HR 56 | Temp 97.0°F | Resp 16 | Ht 62.5 in | Wt 236.0 lb

## 2018-02-14 VITALS — BP 148/72 | HR 66 | Ht 62.5 in | Wt 238.2 lb

## 2018-02-14 VITALS — BP 135/48 | Ht 62.5 in | Wt 238.0 lb

## 2018-02-14 DIAGNOSIS — I6523 Occlusion and stenosis of bilateral carotid arteries: Secondary | ICD-10-CM

## 2018-02-14 DIAGNOSIS — E118 Type 2 diabetes mellitus with unspecified complications: Secondary | ICD-10-CM

## 2018-02-14 DIAGNOSIS — I10 Essential (primary) hypertension: Secondary | ICD-10-CM

## 2018-02-14 DIAGNOSIS — Z9889 Other specified postprocedural states: Secondary | ICD-10-CM

## 2018-02-14 DIAGNOSIS — I480 Paroxysmal atrial fibrillation: Secondary | ICD-10-CM | POA: Diagnosis not present

## 2018-02-14 DIAGNOSIS — R0989 Other specified symptoms and signs involving the circulatory and respiratory systems: Secondary | ICD-10-CM | POA: Insufficient documentation

## 2018-02-14 DIAGNOSIS — M5416 Radiculopathy, lumbar region: Secondary | ICD-10-CM

## 2018-02-14 NOTE — Patient Instructions (Signed)

## 2018-02-14 NOTE — Progress Notes (Signed)
Chief Complaint: Follow up Extracranial Carotid Artery Stenosis   History of Present Illness  Emily [name] is a 67 y.o. female who is status post left carotid endarterectomy with bovine pericardial patch angioplasty on  03/01/2017by Dr. Trula Slade. This was done for asymptomatic bilateral carotid stenosis, left greater than right. Intraoperative findings included a 85% stenosis. Her postoperative follow-up was uncomplicated and she was discharged to home the following day.She stated that her left middle finger was a little numb after the procedure. She was a very difficult A-line.   She reports no new neurologic symptoms. She has had pain in her left leg which has been attributed to back issues.  Dr. Trula Slade last evaluated pt on 08-03-16. At that timeright extracranial carotid arterystenosis remainedin the 60-79% category. The left endarterectomy sitewas widely patent.The patient remainedneurologically intact. We will continue with duplex ultrasound surveillance in 6 months.  She denies any known history of stroke or TIA. Specifically she deniesa history of amaurosis fugax or monocular blindness, unilateral facial drooping, hemiplegia, orreceptive or expressive aphasia.  She denies claudication symptoms with walking when her iron levels are closer to normal, otherwise she feels generalized fatigue and weakness.   She states the use of her C-PAP has helped control her atrial fib.  She is getting iron infusions managed by her hematologist.  2 bleeding polyps found in her stomach and had this addressed by GI in [place]; but states she still is anemic and requires iron infusions.   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  Percival Spanish.She takes the Matzim only if she feels her heart rate is too fast   Past Medical History:  Diagnosis Date  . Anemia   . Arthritis   . Bilateral carotid artery disease ([place])   . Diabetes mellitus without complication ([place])   . Difficult intubation    states 'lady that did the sleep study told me I have the smallest airway she has ever seen in an adult"  . Dysrhythmia   . Heart murmur   . Hypertension   . Hypothyroid   . Obesity   . Obstructive sleep apnea    on C Pap  . PAF (paroxysmal atrial fibrillation) ([place])    a. newly dx in 09/2013; on eliquis  . UTI (urinary tract infection)     Social History Social History   Tobacco Use  . Smoking status: Never Smoker  . Smokeless tobacco: Never Used  Substance Use Topics  . Alcohol use: No    Alcohol/week: 0.0 standard drinks  . Drug use: Never    Family History Family History  Problem Relation Age of Onset  . COPD Father   . Heart failure Father   . Heart disease Father   . Emphysema Father   . Arrhythmia Sister   . Arrhythmia Sister   . Arrhythmia Sister        had PPM also  . Cancer Sister     Surgical History Past Surgical History:  Procedure Laterality Date  . CATARACT EXTRACTION Left   . CATARACT EXTRACTION W/PHACO Right 12/09/2015   Procedure: CATARACT EXTRACTION PHACO AND INTRAOCULAR LENS PLACEMENT (IOC);  Surgeon: Tonny Branch, MD;  Location: [facility];  Service: Ophthalmology;  Laterality: Right;  CDE:10.48  . COLONOSCOPY W/ POLYPECTOMY    . cyst on back of neck  removed    . DILATION AND CURETTAGE OF UTERUS     x 5  . ENDARTERECTOMY Left 08/30/2015   Procedure: LEFT CAROYID ENDARTERECTOMY WITH XENOSURE BOVINE PERICARDIUM PATCH ANGIOPLASTY;  Surgeon: [name], MD;  Location: [facility];  Service: Vascular;  Laterality: Left;  . EYE SURGERY    . TONSILLECTOMY    . UPPER GI ENDOSCOPY      Allergies  Allergen Reactions  . Quinapril Hcl Cough  . Statins Other (See Comments)    Not all Statins but  some cause cough and pain in legs.  . Tape Other (See Comments)    Redness, please use "paper" tape    Current Outpatient Medications  Medication Sig Dispense Refill  . chlorthalidone (HYGROTON) 25 MG tablet Take 25 mg by mouth daily.  2  . CRANBERRY PO Take 1 tablet by mouth 2 (two) times daily.    Marland Kitchen diltiazem (CARDIZEM) 30 MG tablet TAKE 1 TABLET BY MOUTH EVERY DAY 90 tablet 1  . ELIQUIS 5 MG TABS tablet TAKE 1 TABLET (5 MG TOTAL) BY MOUTH 2 (TWO) TIMES DAILY. 60 tablet 8  . ezetimibe (ZETIA) 10 MG tablet Take 10 mg by mouth daily.    . flecainide (TAMBOCOR) 150 MG tablet TAKE 0.5 TABLETS (75 MG TOTAL) BY MOUTH 2 (TWO) TIMES DAILY. 90 tablet 1  . furosemide (LASIX) 20 MG tablet Take 1 tablet (20 mg total) by mouth daily as needed for fluid. 90 tablet 0  . glipiZIDE (GLUCOTROL) 5 MG tablet Take 1 tablet (5 mg total) by mouth 2 (two) times daily. 180 tablet 0  . levothyroxine (SYNTHROID, LEVOTHROID) 112 MCG tablet TAKE 1 TABLET BY MOUTH EVERY OTHER DAY, ALTERNATING WITH 1 & 1/2 TABLETS EVERY OTHER DAY 45 tablet 3  . losartan (COZAAR) 100 MG tablet Take 1 tablet (100 mg total) by mouth daily. (Patient taking differently: Take 50 mg by mouth daily. ) 90 tablet 3  . lovastatin (MEVACOR) 40 MG tablet Take 1 tablet (40 mg total) by mouth every evening. 90 tablet 3  . MATZIM LA 180 MG 24 hr tablet TAKE ONE TABLET BY MOUTH ONE TIME DAILY 60 tablet 11  . metFORMIN (GLUCOPHAGE) 1000 MG tablet Take 1 tablet (1,000 mg total) by mouth 2 (two) times daily with a meal. 180 tablet 3  . metoprolol succinate (TOPROL-XL) 100 MG 24 hr tablet TAKE 1 TABLET BY MOUTH EVERY DAY 90 tablet 1  . PRESCRIPTION MEDICATION Apply 1 application topically daily.    Marland Kitchen rOPINIRole (REQUIP) 0.5 MG tablet Take 0.5-2 tablets (0.25-1 mg total) by mouth at bedtime. 60 tablet 1  . oxyCODONE-acetaminophen (PERCOCET/ROXICET) 5-325 MG tablet Take 1-2 tablets by mouth every 6 (six) hours as needed for severe pain. (Patient not taking:  Reported on 02/14/2018) 30 tablet 0   Current Facility-Administered Medications  Medication Dose Route Frequency Provider Last Rate Last Dose  . cyanocobalamin ((VITAMIN B-12)) injection 1,000 mcg  1,000 mcg Intramuscular Q30 days Timmothy Euler, MD   1,000 mcg at 01/25/18 1025    Review of Systems : See HPI for pertinent positives and negatives.  Physical Examination  Vitals:   02/14/18 1114 02/14/18 1116 02/14/18 1117  BP: (!) 158/68 (!) 152/69 (!) 151/67  Pulse: (!) 56 (!) 56 (!) 56  Resp: 16    Temp: (!) 97 F (36.1 C)    TempSrc: Oral    SpO2: 97%    Weight: 236 lb (107 kg)    Height: 5' 2.5" (1.588  m)     Body mass index is 42.48 kg/m.  General: WDWNmorbidly obesefemalein NAD GAIT:normal HENT: Large neck Eyes: PERRLA Pulmonary: Respirations are non-labored, distant breath sounds, CTAB, no rales, rhonchi, orwheezing. Cardiac:regularrhythm, +murmur.  VASCULAR EXAM Carotid Bruits Right Left   Positive vs transmitted cardiac murmur Positive vs transmitted cardiac murmur   Abdominal aortic pulse isnotpalpable. Radial pulses are2+palpable and equal.   LE Pulses Right Left  POPLITEAL notpalpable  notpalpable  POSTERIOR TIBIAL notpalpable  notpalpable   DORSALIS PEDIS ANTERIOR TIBIAL 1+palpable  1+palpable     Gastrointestinal:soft, nontender, BS WNL, no r/g,nopalpable masses, large panus. Musculoskeletal:Nomuscle atrophy/wasting. M/S 5/5 throughout, extremities without ischemic changes. Neurologic: A&O X 3; appropriate affect, sensation is normal; speech is normal, CN 2-12 intact, pain and light touch intact in extremities, motor exam as listed above. Psychiatric: Normal thought content, mood appropriate to clinical situation.    Assessment: Emily [name] is a 67 y.o. female whois status post left carotid endarterectomy with bovine pericardial patch angioplasty on   07/31/2015. She has no history of stroke or TIA.   Her atherosclerotic risk factors include uncontrolled DM, PAF (takes Eliquis), OSA (uses CPAP), and morbid obesity.  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  DATA Carotid Duplex (02-14-18): Right ICA: 60-79% stenosis. Left ICA: CEA site. 40-59% stenosis  Bilateral ECA: >50% stenosis  Bilateral vertebral artery flow is antegrade.  Bilateral subclavian artery waveforms are normal.  No significant change compared to the exam on 08-09-17.   Plan:  Follow-up in36monthswith Carotid Duplex scan.   I discussed in depth with the patient the nature of atherosclerosis, and emphasized the importance of maximal medical management including strict control of blood pressure, blood glucose, and lipid levels, obtaining regular exercise, and continued cessation of smoking.  The patient is aware that without maximal medical management the underlying atherosclerotic disease process will progress, limiting the benefit of any interventions. The patient was given information about stroke prevention and what symptoms should prompt the patient to seek immediate medical care. Thank you for allowing [place] to participate in this patient's care.  Clemon Chambers, RN, MSN, FNP-C [facility] Office: [facility] Physician: Trula Slade  02/14/18 11:25 AM

## 2018-02-14 NOTE — Progress Notes (Signed)
  Patient comes in today for follow-up on left leg radiculopathy.  Overall she is improving.  Still with a little bit of weakness in the left leg but it is not severe.  Given her overall improvement, we decided to hold off on further imaging or treatment.  I did explain to her that if her symptoms become severe again then we could always reconsider merits of an MRI of her lumbar spine.  Follow-up as needed.

## 2018-02-14 NOTE — Patient Instructions (Signed)
Medication Instructions:  Your physician recommends that you continue on your current medications as directed. Please refer to the Current Medication list given to you today.   Labwork: None ordered  Testing/Procedures: None ordered  Follow-Up: Your physician wants you to follow-up in: 1 year with Dr.Hochrein You will receive a reminder letter in the mail two months in advance. If you don't receive a letter, please call our office to schedule the follow-up appointment.   Any Other Special Instructions Will Be Listed Below (If Applicable).     If you need a refill on your cardiac medications before your next appointment, please call your pharmacy.   

## 2018-02-14 NOTE — Progress Notes (Signed)
Vitals:   02/14/18 1114 02/14/18 1116  BP: (!) 158/68 (!) 152/69  Pulse: (!) 56 (!) 56  Resp: 16   Temp: (!) 97 F (36.1 C)   TempSrc: Oral   SpO2: 97%   Weight: 236 lb (107 kg)   Height: 5' 2.5" (1.588 m)

## 2018-02-15 NOTE — Progress Notes (Signed)
Subjective: CC: Restless legs, DM2 PCP: Janora Norlander, DO ZOX:WRUEA Laguna is a 66 y.o. female presenting to clinic today for:  1. Restless legs Patient was seen in July for restless leg syndrome.  She was prescribed Requip with instructions to titrate up to 1 mg nightly.  She follows up and notes that restless leg symptoms have improved.  She states that she is currently taking 0.5 mg nightly and is tolerating medication without difficulty.  She reports good sleep on this.  2. Type 2 Diabetes w/ HTN, Afib and HLD  Diagnosed: Several years ago.  She does not routinely monitor blood sugar.  She is taking medication(s): Metformin 1000mg  BID, glipizide, lovastatin 40mg , Zetia 10mg , Losartan 100 mg, Diltiazem LA, Flecainide 100mg , Chlorthalidone and PRN Lasix.  She is also anticoagulated with Eliquis. Side effects: None  Last eye exam: Due in December.  Per her report no history of retinopathy. Last foot exam: due Last A1c: 10/2017 8.1. Nephropathy screen indicated?: on ARB Last flu, zoster and/or pneumovax: UTD  ROS: denies dizziness, LOC, unintended weight loss/gain, foot ulcerations, numbness or tingling in extremities or chest pain.  She does report feeling thirsty.  3. Hypothyroidism She reports having been diagnosed in her 66s.  No history of surgery prior to onset of hypothyroidism to the neck.  She does report a recent carotid surgery, which she states seemed to decrease her vocal range.  She reports compliance with levothyroxine 112 mcg.  She denies any constipation, diarrhea, unplanned weight loss.  No heart palpitations.  No tremor.  4.  Chronic low back pain Patient reports history of chronic low back pain.  She has had joint pains in other areas and actually seen rheumatology for this.  Her lower extremity pain was thought to be possibly related to restless leg and arthritis.  She saw Dr. Micheline Chapman recently who was concerned that left-sided lower extremity pain may be  related to her low back.  She was asymptomatic at that time and therefore no further imaging or interventions were performed.  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  She states that rest sometimes will alleviate this.  She is tried using stools at work but this does not seem to help prevent the low back pain.  She is also taken Tylenol quite a bit but feels that they are not very helpful.  She reports very rare use of Percocet 5 mg, citing that she can go up to a week without having to take it.  Denies any excessive sedation, shortness of breath, falls or constipation with the medication.    ROS: Per HPI  Allergies  Allergen Reactions  . Quinapril Hcl Cough  . Statins Other (See Comments)    Not all Statins but some cause cough and pain in legs.  . Tape Other (See Comments)    Redness, please use "paper" tape   Past Medical History:  Diagnosis Date  . Anemia   . Arthritis   . Bilateral carotid artery disease ([place])   . Diabetes mellitus without complication ([place])   . Difficult intubation    states 'lady that did the sleep study told me I have the smallest airway she has ever seen in an adult"  . Dysrhythmia   . Heart murmur   . Hypertension   . Hypothyroid   . Obesity   . Obstructive sleep apnea    on C  Pap  . PAF (paroxysmal atrial fibrillation) ([place])    a. newly dx in 09/2013; on eliquis  . UTI (urinary tract infection)     Current Outpatient Medications:  .  chlorthalidone (HYGROTON) 25 MG tablet, Take 25 mg by mouth daily., Disp: , Rfl: 2 .  CRANBERRY PO, Take 1 tablet by mouth 2 (two) times daily., Disp: , Rfl:  .  diltiazem (CARDIZEM) 30 MG tablet, TAKE 1 TABLET BY MOUTH EVERY DAY, Disp: 90 tablet, Rfl: 1 .  ELIQUIS 5 MG TABS tablet, TAKE 1 TABLET (5 MG TOTAL) BY MOUTH 2 (TWO) TIMES DAILY., Disp: 60 tablet, Rfl: 8 .  ezetimibe (ZETIA) 10 MG tablet, Take 10 mg by mouth daily.,  Disp: , Rfl:  .  flecainide (TAMBOCOR) 150 MG tablet, TAKE 0.5 TABLETS (75 MG TOTAL) BY MOUTH 2 (TWO) TIMES DAILY., Disp: 90 tablet, Rfl: 1 .  furosemide (LASIX) 20 MG tablet, Take 1 tablet (20 mg total) by mouth daily as needed for fluid., Disp: 90 tablet, Rfl: 0 .  glipiZIDE (GLUCOTROL) 5 MG tablet, Take 1 tablet (5 mg total) by mouth 2 (two) times daily., Disp: 180 tablet, Rfl: 0 .  levothyroxine (SYNTHROID, LEVOTHROID) 112 MCG tablet, TAKE 1 TABLET BY MOUTH EVERY OTHER DAY, ALTERNATING WITH 1 & 1/2 TABLETS EVERY OTHER DAY, Disp: 45 tablet, Rfl: 3 .  losartan (COZAAR) 100 MG tablet, Take 1 tablet (100 mg total) by mouth daily. (Patient taking differently: Take 50 mg by mouth daily. ), Disp: 90 tablet, Rfl: 3 .  lovastatin (MEVACOR) 40 MG tablet, Take 1 tablet (40 mg total) by mouth every evening., Disp: 90 tablet, Rfl: 3 .  MATZIM LA 180 MG 24 hr tablet, TAKE ONE TABLET BY MOUTH ONE TIME DAILY, Disp: 60 tablet, Rfl: 11 .  metFORMIN (GLUCOPHAGE) 1000 MG tablet, Take 1 tablet (1,000 mg total) by mouth 2 (two) times daily with a meal., Disp: 180 tablet, Rfl: 3 .  metoprolol succinate (TOPROL-XL) 100 MG 24 hr tablet, TAKE 1 TABLET BY MOUTH EVERY DAY, Disp: 90 tablet, Rfl: 1 .  oxyCODONE-acetaminophen (PERCOCET/ROXICET) 5-325 MG tablet, Take 1-2 tablets by mouth every 6 (six) hours as needed for severe pain., Disp: 30 tablet, Rfl: 0 .  PRESCRIPTION MEDICATION, Apply 1 application topically daily., Disp: , Rfl:  .  rOPINIRole (REQUIP) 0.5 MG tablet, Take 0.5-2 tablets (0.25-1 mg total) by mouth at bedtime., Disp: 60 tablet, Rfl: 1  Current Facility-Administered Medications:  .  cyanocobalamin ((VITAMIN B-12)) injection 1,000 mcg, 1,000 mcg, Intramuscular, Q30 days, Timmothy Euler, MD, 1,000 mcg at 01/25/18 1025 Social History   Socioeconomic History  . Marital status: Single    Spouse name: Not on file  . Number of children: Not on file  . Years of education: Not on file  . Highest education  level: Not on file  Occupational History  . Not on file  Social Needs  . Financial resource strain: Not on file  . Food insecurity:    Worry: Not on file    Inability: Not on file  . Transportation needs:    Medical: No    Non-medical: No  Tobacco Use  . Smoking status: Never Smoker  . Smokeless tobacco: Never Used  Substance and Sexual Activity  . Alcohol use: No    Alcohol/week: 0.0 standard drinks  . Drug use: Never  . Sexual activity: Not on file  Lifestyle  . Physical activity:    Days per week: 0 days    Minutes per  session: 0 min  . Stress: Not at all  Relationships  . Social connections:    Talks on phone: Twice a week    Gets together: Twice a week    Attends religious service: More than 4 times per year    Active member of club or organization: No    Attends meetings of clubs or organizations: Never    Relationship status: Never married  . Intimate partner violence:    Fear of current or ex partner: No    Emotionally abused: No    Physically abused: No    Forced sexual activity: No  Other Topics Concern  . Not on file  Social History Narrative  . Not on file   Family History  Problem Relation Age of Onset  . COPD Father   . Heart failure Father   . Heart disease Father   . Emphysema Father   . Arrhythmia Sister   . Arrhythmia Sister   . Arrhythmia Sister        had PPM also  . Cancer Sister     Objective: Office vital signs reviewed. BP 122/68   Pulse (!) 58   Temp (!) 97.5 F (36.4 C) (Oral)   Ht 5\' 2"  (1.575 m)   Wt 237 lb (107.5 kg)   BMI 43.35 kg/m   Physical Examination:  General: Awake, alert, well nourished, obese, No acute distress HEENT: Normal    Neck: No masses palpated. No lymphadenopathy; no goiter or palpable thyroid nodules.  She has a well-healed scar along the left side of the neck    Eyes: PERRLA, extraocular membranes intact, sclera white, no exophthalmos    Throat: moist mucus membranes Cardio: regular rate and  rhythm, S1S2 heard, very soft systolic murmur appreciated along the left sternal border Pulm: clear to auscultation bilaterally, no wheezes, rhonchi or rales; normal work of breathing on room air Extremities: warm, well perfused, No edema, cyanosis or clubbing; +2 pulses bilaterally MSK: normal gait and station Neuro: No resting tremor appreciated.  She follows all commands.  No focal neurologic deficits. Skin: Normal temperature.  Assessment/ Plan: 67 y.o. female   1. Diabetes mellitus without complication ([facility]) Check A1c today.  Continue current regimen.  Adjustments will be pending A1c.  Will need to obtain diabetic foot exam at next visit.  2. Hypertension associated with diabetes ([place]) Blood pressure under excellent control on current regimen.  No changes needed.  3. Hyperlipidemia associated with type 2 diabetes mellitus ([place]) Continue antilipid medications  4. Hypothyroidism, unspecified type - TSH  5. PAF (paroxysmal atrial fibrillation) (H[facility]) Rate controlled with seemly regular rhythm today  6. Restless leg syndrome Doing very well on Requip 0.5 mg.  7. Chronic left-sided low back pain with left-sided sciatica She still has about 6 tablets of the previous Rx from July left.  We had a long discussion with regards to opioids for chronic low back pain and arthritic pains in general.  She has a very good understanding of the risk of opioid use, including addiction, sedation, respiratory depression and even death.  Because she is anticoagulated with Eliquis, she is not a good candidate for NSAID medications.  Would like to avoid interval steroid bursts as well given history of diabetes.  We discussed exhausting topical therapies and Tylenol prior to use of Percocet.  She seems to be using this responsibly and infrequently.  Therefore, I have agreed to renew the medication for as needed use.  We reviewed indications for use.  She will continue to follow-up with me on an every 3 months  interval if needed for chronic pain.  Should we find that she is needing the medication more frequently, we discussed that it would be a good idea to proceed with MRI of the back and possible injections with sports medicine as discussed.  She voiced good understanding and will follow-up with me as scheduled.  Should she continue to require this medication, we will plan to complete controlled substance contract at next visit.   Orders Placed This Encounter  Procedures  . TSH   Meds ordered this encounter  Medications  . oxyCODONE-acetaminophen (PERCOCET/ROXICET) 5-325 MG tablet    Sig: Take 0.5-1 tablets by mouth every 8 (eight) hours as needed for severe pain.    Dispense:  30 tablet    Refill:  0    Please disregard previous Rx for 40 tabs- thank you!  Marland Kitchen rOPINIRole (REQUIP) 0.5 MG tablet    Sig: Take 1 tablet (0.5 mg total) by mouth at bedtime.    Dispense:  90 tablet    Refill:  1  . glipiZIDE (GLUCOTROL) 5 MG tablet    Sig: Take 1 tablet (5 mg total) by mouth 2 (two) times daily.    Dispense:  180 tablet    Refill:  [place], DO [place] [phone]

## 2018-02-16 ENCOUNTER — Encounter: Payer: Self-pay | Admitting: Family Medicine

## 2018-02-16 ENCOUNTER — Ambulatory Visit (INDEPENDENT_AMBULATORY_CARE_PROVIDER_SITE_OTHER): Payer: Medicare Other | Admitting: Family Medicine

## 2018-02-16 VITALS — BP 122/68 | HR 58 | Temp 97.5°F | Ht 62.0 in | Wt 237.0 lb

## 2018-02-16 DIAGNOSIS — E1159 Type 2 diabetes mellitus with other circulatory complications: Secondary | ICD-10-CM

## 2018-02-16 DIAGNOSIS — G8929 Other chronic pain: Secondary | ICD-10-CM | POA: Diagnosis not present

## 2018-02-16 DIAGNOSIS — I480 Paroxysmal atrial fibrillation: Secondary | ICD-10-CM

## 2018-02-16 DIAGNOSIS — G2581 Restless legs syndrome: Secondary | ICD-10-CM | POA: Insufficient documentation

## 2018-02-16 DIAGNOSIS — E039 Hypothyroidism, unspecified: Secondary | ICD-10-CM

## 2018-02-16 DIAGNOSIS — E1169 Type 2 diabetes mellitus with other specified complication: Secondary | ICD-10-CM

## 2018-02-16 DIAGNOSIS — E785 Hyperlipidemia, unspecified: Secondary | ICD-10-CM | POA: Diagnosis not present

## 2018-02-16 DIAGNOSIS — I152 Hypertension secondary to endocrine disorders: Secondary | ICD-10-CM

## 2018-02-16 DIAGNOSIS — M5442 Lumbago with sciatica, left side: Secondary | ICD-10-CM

## 2018-02-16 DIAGNOSIS — E119 Type 2 diabetes mellitus without complications: Secondary | ICD-10-CM | POA: Diagnosis not present

## 2018-02-16 DIAGNOSIS — I10 Essential (primary) hypertension: Secondary | ICD-10-CM | POA: Diagnosis not present

## 2018-02-16 MED ORDER — GLIPIZIDE 5 MG PO TABS: 5.0000 mg | ORAL_TABLET | Freq: Two times a day (BID) | ORAL | 0 refills | Status: DC

## 2018-02-16 MED ORDER — OXYCODONE-ACETAMINOPHEN 5-325 MG PO TABS: 0.5000 | ORAL_TABLET | Freq: Three times a day (TID) | ORAL | 0 refills | Status: DC | PRN

## 2018-02-16 MED ORDER — ROPINIROLE HCL 0.5 MG PO TABS: 0.5000 mg | ORAL_TABLET | Freq: Every day | ORAL | 1 refills | Status: DC

## 2018-02-16 NOTE — Patient Instructions (Signed)
You had labs performed today.  You will be contacted with the results of the labs once they are available, usually in the next 3 business days for routine lab work.    Back Pain, Adult Many adults have back pain from time to time. Common causes of back pain include:  A strained muscle or ligament.  Wear and tear (degeneration) of the spinal disks.  Arthritis.  A hit to the back.  Back pain can be short-lived (acute) or last a long time (chronic). A physical exam, lab tests, and imaging studies may be done to find the cause of your pain. Follow these instructions at home: Managing pain and stiffness  Take over-the-counter and prescription medicines only as told by your health care provider.  If directed, apply heat to the affected area as often as told by your health care provider. Use the heat source that your health care provider recommends, such as a moist heat pack or a heating pad. ? Place a towel between your skin and the heat source. ? Leave the heat on for 20-30 minutes. ? Remove the heat if your skin turns bright red. This is especially important if you are unable to feel pain, heat, or cold. You have a greater risk of getting burned.  If directed, apply ice to the injured area: ? Put ice in a plastic bag. ? Place a towel between your skin and the bag. ? Leave the ice on for 20 minutes, 2-3 times a day for the first 2-3 days. Activity  Do not stay in bed. Resting more than 1-2 days can delay your recovery.  Take short walks on even surfaces as soon as you are able. Try to increase the length of time you walk each day.  Do not sit, drive, or stand in one place for more than 30 minutes at a time. Sitting or standing for long periods of time can put stress on your back.  Use proper lifting techniques. When you bend and lift, use positions that put less stress on your back: ? Basco your knees. ? Keep the load close to your body. ? Avoid twisting.  Exercise regularly as  told by your health care provider. Exercising will help your back heal faster. This also helps prevent back injuries by keeping muscles strong and flexible.  Your health care provider may recommend that you see a physical therapist. This person can help you come up with a safe exercise program. Do any exercises as told by your physical therapist. Lifestyle  Maintain a healthy weight. Extra weight puts stress on your back and makes it difficult to have good posture.  Avoid activities or situations that make you feel anxious or stressed. Learn ways to manage anxiety and stress. One way to manage stress is through exercise. Stress and anxiety increase muscle tension and can make back pain worse. General instructions  Sleep on a firm mattress in a comfortable position. Try lying on your side with your knees slightly bent. If you lie on your back, put a pillow under your knees.  Follow your treatment plan as told by your health care provider. This may include: ? Cognitive or behavioral therapy. ? Acupuncture or massage therapy. ? Meditation or yoga. Contact a health care provider if:  You have pain that is not relieved with rest or medicine.  You have increasing pain going down into your legs or buttocks.  Your pain does not improve in 2 weeks.  You have pain at night.  You lose weight.  You have a fever or chills. Get help right away if:  You develop new bowel or bladder control problems.  You have unusual weakness or numbness in your arms or legs.  You develop nausea or vomiting.  You develop abdominal pain.  You feel faint. Summary  Many adults have back pain from time to time. A physical exam, lab tests, and imaging studies may be done to find the cause of your pain.  Use proper lifting techniques. When you bend and lift, use positions that put less stress on your back.  Take over-the-counter and prescription medicines and apply heat or ice as directed by your health care  provider. This information is not intended to replace advice given to you by your health care provider. Make sure you discuss any questions you have with your health care provider. Document Released: 05/18/2005 Document Revised: 06/22/2016 Document Reviewed: 06/22/2016 Elsevier Interactive Patient Education  2018 [employer]   Controlled Substance Guidelines:  1. You cannot get an early refill, even it is lost.  2. You cannot get pain medications from any other doctor, unless it is the emergency department and related to a new problem or injury.  3. You cannot use alcohol, marijuana, cocaine or any other recreational drugs while using this medication. This is very dangerous.  4. You are willing to have your urine drug tested at each visit.  5. You will not drive while using this medication, because that can put yourself and others in serious danger of an accident. 6. If any medication is stolen, then there must be a police report to verify it, or it cannot be refilled.  7. I will not prescribe these medications for longer than 3 months.  8. You must bring your pill bottle to each visit.  9. You must use the same pharmacy for all refills for the medication, unless you clear it with me beforehand.  10. You cannot share or sell this medication.  11. Always take a stool softener to prevent constipation.

## 2018-02-17 ENCOUNTER — Other Ambulatory Visit: Payer: Self-pay | Admitting: Family Medicine

## 2018-02-17 LAB — TSH: TSH: 3.18 u[IU]/mL (ref 0.450–4.500)

## 2018-02-17 MED ORDER — LEVOTHYROXINE SODIUM 112 MCG PO TABS: ORAL_TABLET | ORAL | 1 refills | Status: DC

## 2018-02-22 ENCOUNTER — Ambulatory Visit (INDEPENDENT_AMBULATORY_CARE_PROVIDER_SITE_OTHER): Payer: Medicare Other | Admitting: *Deleted

## 2018-02-22 DIAGNOSIS — E538 Deficiency of other specified B group vitamins: Secondary | ICD-10-CM | POA: Diagnosis not present

## 2018-02-22 NOTE — Progress Notes (Signed)
Pt given Cyanocobalamin inj Tolerated well 

## 2018-03-01 ENCOUNTER — Inpatient Hospital Stay (HOSPITAL_COMMUNITY): Payer: Medicare Other | Attending: Hematology

## 2018-03-01 ENCOUNTER — Other Ambulatory Visit (HOSPITAL_COMMUNITY): Payer: Medicare Other

## 2018-03-01 DIAGNOSIS — D508 Other iron deficiency anemias: Secondary | ICD-10-CM | POA: Insufficient documentation

## 2018-03-01 LAB — CBC WITH DIFFERENTIAL/PLATELET
BASOS PCT: 0 %
Basophils Absolute: 0.1 10*3/uL (ref 0.0–0.1)
EOS ABS: 0.3 10*3/uL (ref 0.0–0.7)
EOS PCT: 3 %
HCT: 28.3 % — ABNORMAL LOW (ref 36.0–46.0)
Hemoglobin: 9.2 g/dL — ABNORMAL LOW (ref 12.0–15.0)
LYMPHS ABS: 1.2 10*3/uL (ref 0.7–4.0)
Lymphocytes Relative: 10 %
MCH: 30 pg (ref 26.0–34.0)
MCHC: 32.5 g/dL (ref 30.0–36.0)
MCV: 92.2 fL (ref 78.0–100.0)
MONO ABS: 0.8 10*3/uL (ref 0.1–1.0)
MONOS PCT: 7 %
NEUTROS PCT: 80 %
Neutro Abs: 9.3 10*3/uL — ABNORMAL HIGH (ref 1.7–7.7)
PLATELETS: 219 10*3/uL (ref 150–400)
RBC: 3.07 MIL/uL — ABNORMAL LOW (ref 3.87–5.11)
RDW: 17.7 % — AB (ref 11.5–15.5)
WBC: 11.6 10*3/uL — ABNORMAL HIGH (ref 4.0–10.5)

## 2018-03-01 LAB — COMPREHENSIVE METABOLIC PANEL
ALBUMIN: 3.9 g/dL (ref 3.5–5.0)
ALT: 22 U/L (ref 0–44)
AST: 25 U/L (ref 15–41)
Alkaline Phosphatase: 88 U/L (ref 38–126)
Anion gap: 11 (ref 5–15)
BUN: 35 mg/dL — ABNORMAL HIGH (ref 8–23)
CO2: 26 mmol/L (ref 22–32)
Calcium: 8.9 mg/dL (ref 8.9–10.3)
Chloride: 99 mmol/L (ref 98–111)
Creatinine, Ser: 0.74 mg/dL (ref 0.44–1.00)
GFR calc non Af Amer: >60 mL/min (ref >60)
GLUCOSE: 229 mg/dL — AB (ref 70–99)
POTASSIUM: 3.8 mmol/L (ref 3.5–5.1)
SODIUM: 136 mmol/L (ref 135–145)
Total Bilirubin: 0.8 mg/dL (ref 0.3–1.2)
Total Protein: 7.4 g/dL (ref 6.5–8.1)

## 2018-03-01 LAB — LACTATE DEHYDROGENASE: LDH: 110 U/L (ref 98–192)

## 2018-03-01 LAB — FERRITIN: FERRITIN: 248 ng/mL (ref 11–307)

## 2018-03-02 ENCOUNTER — Telehealth (HOSPITAL_COMMUNITY): Payer: Self-pay | Admitting: *Deleted

## 2018-03-02 DIAGNOSIS — K921 Melena: Secondary | ICD-10-CM | POA: Diagnosis not present

## 2018-03-02 NOTE — Telephone Encounter (Signed)
LMOM that recent lab results are normal and if she had any questions she could call us back.

## 2018-03-03 DIAGNOSIS — E119 Type 2 diabetes mellitus without complications: Secondary | ICD-10-CM | POA: Diagnosis not present

## 2018-03-03 DIAGNOSIS — D649 Anemia, unspecified: Secondary | ICD-10-CM | POA: Diagnosis not present

## 2018-03-03 DIAGNOSIS — K921 Melena: Secondary | ICD-10-CM | POA: Insufficient documentation

## 2018-03-03 DIAGNOSIS — Z961 Presence of intraocular lens: Secondary | ICD-10-CM | POA: Diagnosis present

## 2018-03-03 DIAGNOSIS — I4891 Unspecified atrial fibrillation: Secondary | ICD-10-CM | POA: Diagnosis not present

## 2018-03-03 DIAGNOSIS — K317 Polyp of stomach and duodenum: Secondary | ICD-10-CM | POA: Diagnosis not present

## 2018-03-03 DIAGNOSIS — Z7984 Long term (current) use of oral hypoglycemic drugs: Secondary | ICD-10-CM | POA: Diagnosis not present

## 2018-03-03 DIAGNOSIS — Z6841 Body Mass Index (BMI) 40.0 and over, adult: Secondary | ICD-10-CM | POA: Diagnosis not present

## 2018-03-03 DIAGNOSIS — Z9842 Cataract extraction status, left eye: Secondary | ICD-10-CM | POA: Diagnosis not present

## 2018-03-03 DIAGNOSIS — D509 Iron deficiency anemia, unspecified: Secondary | ICD-10-CM | POA: Diagnosis present

## 2018-03-03 DIAGNOSIS — Z7901 Long term (current) use of anticoagulants: Secondary | ICD-10-CM | POA: Diagnosis not present

## 2018-03-03 DIAGNOSIS — Z888 Allergy status to other drugs, medicaments and biological substances status: Secondary | ICD-10-CM | POA: Diagnosis not present

## 2018-03-03 DIAGNOSIS — E785 Hyperlipidemia, unspecified: Secondary | ICD-10-CM | POA: Diagnosis present

## 2018-03-03 DIAGNOSIS — K922 Gastrointestinal hemorrhage, unspecified: Secondary | ICD-10-CM | POA: Diagnosis not present

## 2018-03-03 DIAGNOSIS — Z9841 Cataract extraction status, right eye: Secondary | ICD-10-CM | POA: Diagnosis not present

## 2018-03-03 DIAGNOSIS — I110 Hypertensive heart disease with heart failure: Secondary | ICD-10-CM | POA: Diagnosis present

## 2018-03-03 DIAGNOSIS — G4733 Obstructive sleep apnea (adult) (pediatric): Secondary | ICD-10-CM | POA: Diagnosis present

## 2018-03-03 DIAGNOSIS — Z79899 Other long term (current) drug therapy: Secondary | ICD-10-CM | POA: Diagnosis not present

## 2018-03-03 DIAGNOSIS — I5032 Chronic diastolic (congestive) heart failure: Secondary | ICD-10-CM | POA: Diagnosis present

## 2018-03-03 DIAGNOSIS — I10 Essential (primary) hypertension: Secondary | ICD-10-CM | POA: Diagnosis not present

## 2018-03-03 DIAGNOSIS — I700 Atherosclerosis of aorta: Secondary | ICD-10-CM | POA: Diagnosis not present

## 2018-03-03 DIAGNOSIS — I480 Paroxysmal atrial fibrillation: Secondary | ICD-10-CM | POA: Diagnosis not present

## 2018-03-03 DIAGNOSIS — K254 Chronic or unspecified gastric ulcer with hemorrhage: Secondary | ICD-10-CM | POA: Diagnosis not present

## 2018-03-03 DIAGNOSIS — L4050 Arthropathic psoriasis, unspecified: Secondary | ICD-10-CM | POA: Diagnosis present

## 2018-03-03 DIAGNOSIS — R918 Other nonspecific abnormal finding of lung field: Secondary | ICD-10-CM | POA: Diagnosis not present

## 2018-03-03 DIAGNOSIS — E039 Hypothyroidism, unspecified: Secondary | ICD-10-CM | POA: Diagnosis not present

## 2018-03-03 DIAGNOSIS — R4182 Altered mental status, unspecified: Secondary | ICD-10-CM | POA: Diagnosis not present

## 2018-03-03 DIAGNOSIS — K589 Irritable bowel syndrome without diarrhea: Secondary | ICD-10-CM | POA: Diagnosis not present

## 2018-03-03 DIAGNOSIS — Z862 Personal history of diseases of the blood and blood-forming organs and certain disorders involving the immune mechanism: Secondary | ICD-10-CM | POA: Diagnosis not present

## 2018-03-07 ENCOUNTER — Other Ambulatory Visit: Payer: Self-pay | Admitting: *Deleted

## 2018-03-07 ENCOUNTER — Ambulatory Visit: Payer: Medicare Other | Admitting: Family Medicine

## 2018-03-07 ENCOUNTER — Other Ambulatory Visit: Payer: Medicare Other

## 2018-03-07 DIAGNOSIS — M1990 Unspecified osteoarthritis, unspecified site: Secondary | ICD-10-CM | POA: Diagnosis not present

## 2018-03-07 DIAGNOSIS — I10 Essential (primary) hypertension: Secondary | ICD-10-CM | POA: Diagnosis not present

## 2018-03-07 DIAGNOSIS — K921 Melena: Secondary | ICD-10-CM | POA: Diagnosis not present

## 2018-03-07 DIAGNOSIS — R42 Dizziness and giddiness: Secondary | ICD-10-CM | POA: Diagnosis not present

## 2018-03-07 DIAGNOSIS — K317 Polyp of stomach and duodenum: Secondary | ICD-10-CM | POA: Diagnosis not present

## 2018-03-07 DIAGNOSIS — Z6841 Body Mass Index (BMI) 40.0 and over, adult: Secondary | ICD-10-CM | POA: Diagnosis not present

## 2018-03-07 DIAGNOSIS — Z7901 Long term (current) use of anticoagulants: Secondary | ICD-10-CM | POA: Diagnosis not present

## 2018-03-07 DIAGNOSIS — D649 Anemia, unspecified: Secondary | ICD-10-CM

## 2018-03-07 DIAGNOSIS — K589 Irritable bowel syndrome without diarrhea: Secondary | ICD-10-CM | POA: Diagnosis not present

## 2018-03-07 DIAGNOSIS — E785 Hyperlipidemia, unspecified: Secondary | ICD-10-CM | POA: Diagnosis not present

## 2018-03-07 DIAGNOSIS — K922 Gastrointestinal hemorrhage, unspecified: Secondary | ICD-10-CM | POA: Diagnosis not present

## 2018-03-07 DIAGNOSIS — I480 Paroxysmal atrial fibrillation: Secondary | ICD-10-CM | POA: Diagnosis not present

## 2018-03-07 LAB — HEMOGLOBIN, FINGERSTICK: Hemoglobin: 7.7 g/dL — ABNORMAL LOW (ref 11.1–15.9)

## 2018-03-07 MED ORDER — PRAVASTATIN SODIUM 20 MG PO TABS
20.00 | ORAL_TABLET | ORAL | Status: DC
Start: 2018-03-06 — End: 2018-03-07

## 2018-03-07 MED ORDER — ALBUTEROL SULFATE (2.5 MG/3ML) 0.083% IN NEBU
2.50 | INHALATION_SOLUTION | RESPIRATORY_TRACT | Status: DC
Start: ? — End: 2018-03-07

## 2018-03-07 MED ORDER — METOPROLOL SUCCINATE ER 50 MG PO TB24
100.00 | ORAL_TABLET | ORAL | Status: DC
Start: 2018-03-07 — End: 2018-03-07

## 2018-03-07 MED ORDER — CHLORTHALIDONE 25 MG PO TABS
25.00 | ORAL_TABLET | ORAL | Status: DC
Start: 2018-03-07 — End: 2018-03-07

## 2018-03-07 MED ORDER — LOSARTAN POTASSIUM 50 MG PO TABS
50.00 | ORAL_TABLET | ORAL | Status: DC
Start: 2018-03-07 — End: 2018-03-07

## 2018-03-07 MED ORDER — DILTIAZEM HCL ER COATED BEADS 180 MG PO CP24
180.00 | ORAL_CAPSULE | ORAL | Status: DC
Start: 2018-03-07 — End: 2018-03-07

## 2018-03-07 MED ORDER — OXYCODONE HCL 5 MG PO TABS
5.00 | ORAL_TABLET | ORAL | Status: DC
Start: ? — End: 2018-03-07

## 2018-03-07 MED ORDER — INSULIN LISPRO 100 UNIT/ML ~~LOC~~ SOLN
2.00 | SUBCUTANEOUS | Status: DC
Start: 2018-03-06 — End: 2018-03-07

## 2018-03-07 MED ORDER — EZETIMIBE 10 MG PO TABS
10.00 | ORAL_TABLET | ORAL | Status: DC
Start: 2018-03-07 — End: 2018-03-07

## 2018-03-07 MED ORDER — NITROGLYCERIN 0.4 MG SL SUBL
0.40 | SUBLINGUAL_TABLET | SUBLINGUAL | Status: DC
Start: ? — End: 2018-03-07

## 2018-03-07 MED ORDER — FLECAINIDE ACETATE 50 MG PO TABS
75.00 | ORAL_TABLET | ORAL | Status: DC
Start: 2018-03-06 — End: 2018-03-07

## 2018-03-07 MED ORDER — SODIUM CHLORIDE 0.9 % IV SOLN
10.00 | INTRAVENOUS | Status: DC
Start: ? — End: 2018-03-07

## 2018-03-07 MED ORDER — GENERIC EXTERNAL MEDICATION
4.00 | Status: DC
Start: ? — End: 2018-03-07

## 2018-03-07 MED ORDER — GENERIC EXTERNAL MEDICATION
10.00 | Status: DC
Start: ? — End: 2018-03-07

## 2018-03-07 MED ORDER — APIXABAN 5 MG PO TABS
5.00 | ORAL_TABLET | ORAL | Status: DC
Start: 2018-03-06 — End: 2018-03-07

## 2018-03-07 MED ORDER — LEVOTHYROXINE SODIUM 112 MCG PO TABS
112.00 | ORAL_TABLET | ORAL | Status: DC
Start: 2018-03-07 — End: 2018-03-07

## 2018-03-07 MED ORDER — ROPINIROLE HCL 0.25 MG PO TABS
0.50 | ORAL_TABLET | ORAL | Status: DC
Start: 2018-03-06 — End: 2018-03-07

## 2018-03-07 MED ORDER — LEVOTHYROXINE SODIUM 112 MCG PO TABS
168.00 | ORAL_TABLET | ORAL | Status: DC
Start: 2018-03-08 — End: 2018-03-07

## 2018-03-07 MED ORDER — PANTOPRAZOLE SODIUM 40 MG PO TBEC
40.00 | DELAYED_RELEASE_TABLET | ORAL | Status: DC
Start: 2018-03-06 — End: 2018-03-07

## 2018-03-07 MED ORDER — SENNA-DOCUSATE SODIUM 8.6-50 MG PO TABS
1.00 | ORAL_TABLET | ORAL | Status: DC
Start: 2018-03-07 — End: 2018-03-07

## 2018-03-07 MED ORDER — INSULIN GLARGINE 100 UNIT/ML ~~LOC~~ SOLN
14.00 | SUBCUTANEOUS | Status: DC
Start: 2018-03-07 — End: 2018-03-07

## 2018-03-08 ENCOUNTER — Ambulatory Visit (HOSPITAL_COMMUNITY): Payer: Medicare Other | Admitting: Internal Medicine

## 2018-03-08 ENCOUNTER — Other Ambulatory Visit: Payer: Self-pay | Admitting: *Deleted

## 2018-03-08 DIAGNOSIS — K589 Irritable bowel syndrome without diarrhea: Secondary | ICD-10-CM | POA: Diagnosis present

## 2018-03-08 DIAGNOSIS — K921 Melena: Secondary | ICD-10-CM | POA: Diagnosis not present

## 2018-03-08 DIAGNOSIS — E039 Hypothyroidism, unspecified: Secondary | ICD-10-CM | POA: Diagnosis present

## 2018-03-08 DIAGNOSIS — E785 Hyperlipidemia, unspecified: Secondary | ICD-10-CM | POA: Diagnosis not present

## 2018-03-08 DIAGNOSIS — M1990 Unspecified osteoarthritis, unspecified site: Secondary | ICD-10-CM | POA: Diagnosis present

## 2018-03-08 DIAGNOSIS — Z91048 Other nonmedicinal substance allergy status: Secondary | ICD-10-CM | POA: Diagnosis not present

## 2018-03-08 DIAGNOSIS — Z7901 Long term (current) use of anticoagulants: Secondary | ICD-10-CM | POA: Diagnosis not present

## 2018-03-08 DIAGNOSIS — D649 Anemia, unspecified: Secondary | ICD-10-CM | POA: Diagnosis present

## 2018-03-08 DIAGNOSIS — I480 Paroxysmal atrial fibrillation: Secondary | ICD-10-CM | POA: Diagnosis not present

## 2018-03-08 DIAGNOSIS — I10 Essential (primary) hypertension: Secondary | ICD-10-CM | POA: Diagnosis present

## 2018-03-08 DIAGNOSIS — K317 Polyp of stomach and duodenum: Secondary | ICD-10-CM | POA: Diagnosis not present

## 2018-03-08 DIAGNOSIS — G4733 Obstructive sleep apnea (adult) (pediatric): Secondary | ICD-10-CM | POA: Diagnosis present

## 2018-03-08 DIAGNOSIS — E119 Type 2 diabetes mellitus without complications: Secondary | ICD-10-CM | POA: Diagnosis present

## 2018-03-08 DIAGNOSIS — K635 Polyp of colon: Secondary | ICD-10-CM | POA: Diagnosis present

## 2018-03-08 DIAGNOSIS — Z7984 Long term (current) use of oral hypoglycemic drugs: Secondary | ICD-10-CM | POA: Diagnosis not present

## 2018-03-08 DIAGNOSIS — K922 Gastrointestinal hemorrhage, unspecified: Secondary | ICD-10-CM | POA: Diagnosis not present

## 2018-03-08 DIAGNOSIS — Z79899 Other long term (current) drug therapy: Secondary | ICD-10-CM | POA: Diagnosis not present

## 2018-03-08 DIAGNOSIS — Z888 Allergy status to other drugs, medicaments and biological substances status: Secondary | ICD-10-CM | POA: Diagnosis not present

## 2018-03-08 DIAGNOSIS — Z6841 Body Mass Index (BMI) 40.0 and over, adult: Secondary | ICD-10-CM | POA: Diagnosis not present

## 2018-03-08 DIAGNOSIS — L4050 Arthropathic psoriasis, unspecified: Secondary | ICD-10-CM | POA: Diagnosis present

## 2018-03-08 MED ORDER — NITROGLYCERIN 0.4 MG SL SUBL
0.40 | SUBLINGUAL_TABLET | SUBLINGUAL | Status: DC
Start: ? — End: 2018-03-08

## 2018-03-08 MED ORDER — LEVOTHYROXINE SODIUM 112 MCG PO TABS
112.00 | ORAL_TABLET | ORAL | Status: DC
Start: 2018-03-11 — End: 2018-03-08

## 2018-03-08 MED ORDER — ROPINIROLE HCL 0.25 MG PO TABS
0.50 | ORAL_TABLET | ORAL | Status: DC
Start: 2018-03-09 — End: 2018-03-08

## 2018-03-08 MED ORDER — EZETIMIBE 10 MG PO TABS
10.00 | ORAL_TABLET | ORAL | Status: DC
Start: 2018-03-10 — End: 2018-03-08

## 2018-03-08 MED ORDER — HYDRALAZINE HCL 20 MG/ML IJ SOLN
10.00 | INTRAMUSCULAR | Status: DC
Start: ? — End: 2018-03-08

## 2018-03-08 MED ORDER — LOSARTAN POTASSIUM 50 MG PO TABS
50.00 | ORAL_TABLET | ORAL | Status: DC
Start: 2018-03-10 — End: 2018-03-08

## 2018-03-08 MED ORDER — SODIUM CHLORIDE 0.9 % IV SOLN
10.00 | INTRAVENOUS | Status: DC
Start: ? — End: 2018-03-08

## 2018-03-08 MED ORDER — METOPROLOL SUCCINATE ER 50 MG PO TB24
200.00 | ORAL_TABLET | ORAL | Status: DC
Start: 2018-03-10 — End: 2018-03-08

## 2018-03-08 MED ORDER — DILTIAZEM HCL ER COATED BEADS 180 MG PO CP24
180.00 | ORAL_CAPSULE | ORAL | Status: DC
Start: 2018-03-10 — End: 2018-03-08

## 2018-03-08 MED ORDER — OXYCODONE HCL 5 MG PO TABS
5.00 | ORAL_TABLET | ORAL | Status: DC
Start: ? — End: 2018-03-08

## 2018-03-08 MED ORDER — LEVOTHYROXINE SODIUM 112 MCG PO TABS
168.00 | ORAL_TABLET | ORAL | Status: DC
Start: 2018-03-10 — End: 2018-03-08

## 2018-03-08 MED ORDER — INSULIN LISPRO 100 UNIT/ML ~~LOC~~ SOLN
2.00 | SUBCUTANEOUS | Status: DC
Start: 2018-03-09 — End: 2018-03-08

## 2018-03-08 MED ORDER — PANTOPRAZOLE SODIUM 40 MG PO TBEC
40.00 | DELAYED_RELEASE_TABLET | ORAL | Status: DC
Start: 2018-03-10 — End: 2018-03-08

## 2018-03-08 MED ORDER — ALBUTEROL SULFATE (2.5 MG/3ML) 0.083% IN NEBU
2.50 | INHALATION_SOLUTION | RESPIRATORY_TRACT | Status: DC
Start: ? — End: 2018-03-08

## 2018-03-08 MED ORDER — FUROSEMIDE 20 MG PO TABS: 20.0000 mg | ORAL_TABLET | Freq: Every day | ORAL | 0 refills | Status: DC | PRN

## 2018-03-08 MED ORDER — PRAVASTATIN SODIUM 20 MG PO TABS
20.00 | ORAL_TABLET | ORAL | Status: DC
Start: 2018-03-09 — End: 2018-03-08

## 2018-03-08 MED ORDER — FLECAINIDE ACETATE 50 MG PO TABS
75.00 | ORAL_TABLET | ORAL | Status: DC
Start: 2018-03-09 — End: 2018-03-08

## 2018-03-08 MED ORDER — GENERIC EXTERNAL MEDICATION
10.00 | Status: DC
Start: ? — End: 2018-03-08

## 2018-03-09 ENCOUNTER — Other Ambulatory Visit: Payer: Self-pay | Admitting: *Deleted

## 2018-03-09 ENCOUNTER — Telehealth: Payer: Self-pay | Admitting: Family Medicine

## 2018-03-09 ENCOUNTER — Telehealth: Payer: Self-pay

## 2018-03-09 DIAGNOSIS — D649 Anemia, unspecified: Secondary | ICD-10-CM

## 2018-03-09 NOTE — Telephone Encounter (Signed)
Spoke with pt, hosp follow up made and lab appt made

## 2018-03-09 NOTE — Telephone Encounter (Signed)
Notes on file, referral sent to epic

## 2018-03-10 ENCOUNTER — Telehealth: Payer: Self-pay | Admitting: Cardiology

## 2018-03-10 MED ORDER — ASPIRIN EC 81 MG PO TBEC: 81.0000 mg | DELAYED_RELEASE_TABLET | Freq: Every day | ORAL | 3 refills | Status: DC

## 2018-03-10 NOTE — Telephone Encounter (Signed)
Patient has been scheduled for a hospital follow up appointment on Monday, 03/14/18 at 10:30 am with Almyra Deforest, PA at the [facility] office. Patient verbalized understanding. No further questions.

## 2018-03-10 NOTE — Telephone Encounter (Incomplete)
New Message   Pt c/o medication issue:  1. Name of Medication: Eliquis  2. How are you currently taking this medication (dosage and times per day)? TAKE 1 TABLET (5 MG TOTAL) BY MOUTH 2 (TWO) TIMES DAILY  3. Are you having a reaction (difficulty breathing--STAT)?   4. What is your medication issue? Pt states she keeps having bleeding in her stomach, she has been to the er twice and both times they stopped her Eliquis and did a endoscopy which showed no bleeding and they took the pt off Eliquis and advised to just take aspirin. Pt states they also thought she might be a good candidate for The Watchman procedure. And also her hemoglobo wasn a 7.7 Please call

## 2018-03-11 ENCOUNTER — Other Ambulatory Visit: Payer: Medicare Other

## 2018-03-11 DIAGNOSIS — D649 Anemia, unspecified: Secondary | ICD-10-CM

## 2018-03-11 LAB — HEMOGLOBIN, FINGERSTICK: Hemoglobin: 8.2 g/dL — ABNORMAL LOW (ref 11.1–15.9)

## 2018-03-14 ENCOUNTER — Ambulatory Visit (INDEPENDENT_AMBULATORY_CARE_PROVIDER_SITE_OTHER): Payer: Medicare Other | Admitting: Physician Assistant

## 2018-03-14 ENCOUNTER — Encounter: Payer: Self-pay | Admitting: Physician Assistant

## 2018-03-14 VITALS — BP 106/60 | HR 58 | Ht 62.0 in | Wt 233.0 lb

## 2018-03-14 DIAGNOSIS — I6523 Occlusion and stenosis of bilateral carotid arteries: Secondary | ICD-10-CM | POA: Diagnosis not present

## 2018-03-14 DIAGNOSIS — Z5181 Encounter for therapeutic drug level monitoring: Secondary | ICD-10-CM | POA: Diagnosis not present

## 2018-03-14 DIAGNOSIS — Z9989 Dependence on other enabling machines and devices: Secondary | ICD-10-CM

## 2018-03-14 DIAGNOSIS — K921 Melena: Secondary | ICD-10-CM | POA: Diagnosis not present

## 2018-03-14 DIAGNOSIS — E785 Hyperlipidemia, unspecified: Secondary | ICD-10-CM

## 2018-03-14 DIAGNOSIS — I480 Paroxysmal atrial fibrillation: Secondary | ICD-10-CM

## 2018-03-14 DIAGNOSIS — G4733 Obstructive sleep apnea (adult) (pediatric): Secondary | ICD-10-CM | POA: Diagnosis not present

## 2018-03-14 DIAGNOSIS — I10 Essential (primary) hypertension: Secondary | ICD-10-CM | POA: Diagnosis not present

## 2018-03-14 DIAGNOSIS — E039 Hypothyroidism, unspecified: Secondary | ICD-10-CM

## 2018-03-14 DIAGNOSIS — Z79899 Other long term (current) drug therapy: Secondary | ICD-10-CM | POA: Diagnosis not present

## 2018-03-14 DIAGNOSIS — I350 Nonrheumatic aortic (valve) stenosis: Secondary | ICD-10-CM

## 2018-03-14 LAB — CBC
Hematocrit: 28 % — ABNORMAL LOW (ref 34.0–46.6)
Hemoglobin: 9.2 g/dL — ABNORMAL LOW (ref 11.1–15.9)
MCH: 31 pg (ref 26.6–33.0)
MCHC: 32.9 g/dL (ref 31.5–35.7)
MCV: 94 fL (ref 79–97)
Platelets: 270 10*3/uL (ref 150–450)
RBC: 2.97 x10E6/uL — ABNORMAL LOW (ref 3.77–5.28)
RDW: 17.1 % — ABNORMAL HIGH (ref 12.3–15.4)
WBC: 7.7 10*3/uL (ref 3.4–10.8)

## 2018-03-14 MED ORDER — LOSARTAN POTASSIUM 25 MG PO TABS: 25.0000 mg | ORAL_TABLET | Freq: Every day | ORAL | 3 refills | Status: DC

## 2018-03-14 NOTE — Progress Notes (Signed)
Cardiology Office Note    Date:  03/14/2018   ID:  Emily [name], DOB [birth year], MRN [id number]  PCP:  Emily Norlander, DO  Cardiologist:  Dr. Percival [name]   Chief Complaint  Patient presents with  . Follow-up    seen for Dr. Percival [name].     History of Present Illness:  Emily [name] is a 67 y.o. female with PMH of hypothyroidism, PAF, carotid artery disease s/p L CEA 3/17, DM 2, hyperlipidemia, hypertension, obesity, OSA on CPAP and history of heart murmur.  She had a Myoview in February 2017 that showed EF 66%, normal perfusion, no evidence of infarct or ischemia, overall low risk study.  Patient subsequently underwent successful left CEA with bovine pericardial patch angioplasty in March 2017 by Dr. Trula [name].  She was seen in the ED in April 2017 for atrial fibrillation.  She ended up converted back to sinus rhythm en route to the ED after given Cardizem and took pill in the pocket flecainide.  She had a ETT in June 2017, she had a very poor exercise capacity and was only able to exercise for 4 minutes, she could not achieve target heart rate due to pharmacologic blunting of response to exercise.  She was continued on flecainide 75 mg twice daily at the time.  Last echocardiogram obtained on 08/09/2017 showed EF 65 to 70%, grade 2 DD, mild aortic stenosis, PA peak pressure 44 mmHg.  Patient was last seen by Dr. Percival [name] in September 2019, she was doing well at this time.  Patient was admitted to [facility] on 03/03/2018 with symptomatic anemia and increasing weakness.  Hemoglobin was 7.7.  Emily [name] was held.  She was given 2 units of blood transfusion due to ongoing hematochezia and symptom.  EGD showed friable small gastric polyp which was clipped.  Postprocedure, her Emily [name] was restarted.  It was recommended for the patient to consider Watchman device.  Unfortunately, after she was discharged on 10/6, she was readmitted with worsening anemia and melena on 10/7.  EGD was repeated which  revealed no evidence of recurrent bleed found the polyp site with clips intact.  Because of recurrent bleeding, decision was made to discontinue Emily [name] at discharge in favor of baby aspirin.  Patient presents today for cardiology office visit.  Since discharge, her hemoglobin went down to 7.7 before trending back up to 8.2.  I will obtain repeat CBC today.  However given the recurrent bleeding, I think she is at high risk with further bleeding issue on Emily [name], therefore I will continue her on the aspirin for now.  She will need close follow-up with Dr. Percival [name] in 1 month for reconsideration of initiating Emily [name] and potentially refer her to outside facility for [facility] device.  We also discussed briefly during today's visit regarding potential possibility of atrial fibrillation ablation as well.  Otherwise she denies any chest pain, lower extremity edema, orthopnea or PND.  Her blood pressure is borderline low, on further discussion, it turns out the patient is not on 100 mg daily of losartan, she has been taking 50 mg daily instead.  I will further reduce the losartan to 25 mg daily for now.  Otherwise she is due for fasting lipid panel.  Her previous fasting lipid panel showed elevated triglycerides, Zetia was added at the time.   Past Medical History:  Diagnosis Date  . Anemia   . Arthritis   . Atrial fibrillation, rapid ([place]) 10/27/2013  . Bilateral carotid artery disease ([place])   . Diabetes mellitus  without complication ([place])   . Difficult intubation    states 'lady that did the sleep study told me I have the smallest airway she has ever seen in an adult"  . Dyslipidemia 06/29/2017  . Dysrhythmia   . Heart murmur   . Hypertension   . Hypothyroid   . Obesity   . Obstructive sleep apnea    on C Pap  . PAF (paroxysmal atrial fibrillation) ([place])    a. newly dx in 09/2013; on Emily [name]  . UTI (urinary tract infection)     Past Surgical History:  Procedure Laterality Date  . CATARACT  EXTRACTION Left   . CATARACT EXTRACTION W/PHACO Right 12/09/2015   Procedure: CATARACT EXTRACTION PHACO AND INTRAOCULAR LENS PLACEMENT (IOC);  Surgeon: Tonny Branch, MD;  Location: [facility];  Service: Ophthalmology;  Laterality: Right;  CDE:10.48  . COLONOSCOPY W/ POLYPECTOMY    . cyst on back of neck removed    . DILATION AND CURETTAGE OF UTERUS     x 5  . ENDARTERECTOMY Left 08/30/2015   Procedure: LEFT CAROYID ENDARTERECTOMY WITH XENOSURE BOVINE PERICARDIUM PATCH ANGIOPLASTY;  Surgeon: [name], MD;  Location: [place];  Service: Vascular;  Laterality: Left;  . EYE SURGERY    . TONSILLECTOMY    . UPPER GI ENDOSCOPY      Current Medications: Outpatient Medications Prior to Visit  Medication Sig Dispense Refill  . aspirin EC 81 MG tablet Take 1 tablet (81 mg total) by mouth daily. 90 tablet 3  . chlorthalidone (HYGROTON) 25 MG tablet Take 25 mg by mouth daily.  2  . CRANBERRY PO Take 1 tablet by mouth 2 (two) times daily.    Marland Kitchen diltiazem (CARDIZEM) 30 MG tablet TAKE 1 TABLET BY MOUTH EVERY DAY 90 tablet 1  . ezetimibe (ZETIA) 10 MG tablet Take 10 mg by mouth daily.    . flecainide (TAMBOCOR) 150 MG tablet TAKE 0.5 TABLETS (75 MG TOTAL) BY MOUTH 2 (TWO) TIMES DAILY. 90 tablet 1  . furosemide (LASIX) 20 MG tablet Take 1 tablet (20 mg total) by mouth daily as needed for fluid. 90 tablet 0  . glipiZIDE (GLUCOTROL) 5 MG tablet Take 1 tablet (5 mg total) by mouth 2 (two) times daily. 180 tablet 0  . levothyroxine (SYNTHROID, LEVOTHROID) 112 MCG tablet TAKE 1 TABLET BY MOUTH EVERY OTHER DAY, ALTERNATING WITH 1 & 1/2 TABLETS EVERY OTHER DAY 112 tablet 1  . lovastatin (MEVACOR) 40 MG tablet Take 1 tablet (40 mg total) by mouth every evening. 90 tablet 3  . MATZIM LA 180 MG 24 hr tablet TAKE ONE TABLET BY MOUTH ONE TIME DAILY 60 tablet 11  . metFORMIN (GLUCOPHAGE) 1000 MG tablet Take 1 tablet (1,000 mg total) by mouth 2 (two) times daily with a meal. 180 tablet 3  . metoprolol succinate (TOPROL-XL)  100 MG 24 hr tablet TAKE 1 TABLET BY MOUTH EVERY DAY 90 tablet 1  . oxyCODONE-acetaminophen (PERCOCET/ROXICET) 5-325 MG tablet Take 0.5-1 tablets by mouth every 8 (eight) hours as needed for severe pain. 30 tablet 0  . pantoprazole (PROTONIX) 40 MG tablet Take 1 tablet by mouth daily.    Marland Kitchen PRESCRIPTION MEDICATION Apply 1 application topically daily.    Marland Kitchen rOPINIRole (REQUIP) 0.5 MG tablet Take 1 tablet (0.5 mg total) by mouth at bedtime. 90 tablet 1  . losartan (COZAAR) 100 MG tablet Take 1 tablet (100 mg total) by mouth daily. (Patient taking differently: Take 50 mg by mouth daily. ) 90 tablet  3   Facility-Administered Medications Prior to Visit  Medication Dose Route Frequency Provider Last Rate Last Dose  . cyanocobalamin ((VITAMIN B-12)) injection 1,000 mcg  1,000 mcg Intramuscular Q30 days Timmothy Euler, MD   1,000 mcg at 02/22/18 1015     Allergies:   Quinapril hcl; Statins; and Tape   Social History   Socioeconomic History  . Marital status: Single    Spouse name: Not on file  . Number of children: Not on file  . Years of education: Not on file  . Highest education level: Not on file  Occupational History  . Not on file  Social Needs  . Financial resource strain: Not on file  . Food insecurity:    Worry: Not on file    Inability: Not on file  . Transportation needs:    Medical: No    Non-medical: No  Tobacco Use  . Smoking status: Never Smoker  . Smokeless tobacco: Never Used  Substance and Sexual Activity  . Alcohol use: No    Alcohol/week: 0.0 standard drinks  . Drug use: Never  . Sexual activity: Not on file  Lifestyle  . Physical activity:    Days per week: 0 days    Minutes per session: 0 min  . Stress: Not at all  Relationships  . Social connections:    Talks on phone: Twice a week    Gets together: Twice a week    Attends religious service: More than 4 times per year    Active member of club or organization: No    Attends meetings of clubs or  organizations: Never    Relationship status: Never married  Other Topics Concern  . Not on file  Social History Narrative  . Not on file     Family History:  The patient's family history includes Arrhythmia in her sister, sister, and sister; COPD in her father; Cancer in her sister; Emphysema in her father; Heart disease in her father; Heart failure in her father.   ROS:   Please see the history of present illness.    ROS All other systems reviewed and are negative.   PHYSICAL EXAM:   VS:  BP 106/60 (BP Location: Right Arm, Patient Position: Sitting, Cuff Size: Normal)   Pulse (!) 58   Ht 5\' 2"  (1.575 m)   Wt 233 lb (105.7 kg)   SpO2 99%   BMI 42.62 kg/m    GEN: Well nourished, well developed, in no acute distress  HEENT: normal  Neck: no JVD, carotid bruits, or masses Cardiac: RRR; no rubs, or gallops,no edema  +4/2 systolic murmur Respiratory:  clear to auscultation bilaterally, normal work of breathing GI: soft, nontender, nondistended, + BS MS: no deformity or atrophy  Skin: warm and dry, no rash Neuro:  Alert and Oriented x 3, Strength and sensation are intact Psych: euthymic mood, full affect  Wt Readings from Last 3 Encounters:  03/14/18 233 lb (105.7 kg)  02/16/18 237 lb (107.5 kg)  02/14/18 238 lb (108 kg)      Studies/Labs Reviewed:   EKG:  EKG is ordered today.  The ekg ordered today demonstrates normal sinus rhythm, poor R wave progression in anterior leads.  Recent Labs: 02/16/2018: TSH 3.180 03/01/2018: ALT 22; BUN 35; Creatinine, Ser 0.74; Hemoglobin 9.2; Platelets 219; Potassium 3.8; Sodium 136   Lipid Panel    Component Value Date/Time   CHOL 172 08/24/2017 1036   TRIG 162 (H) 08/24/2017 1036   HDL 47  08/24/2017 1036   CHOLHDL 3.7 08/24/2017 1036   LDLCALC 93 08/24/2017 1036    Additional studies/ records that were reviewed today include:   Myoview 07/17/2015 Study Highlights    The left ventricular ejection fraction is hyperdynamic  (>65%).  Nuclear stress EF: 66%.  The study is normal.  This is a low risk study.  There was no ST segment deviation noted during stress.  Blood pressure demonstrated a hypertensive response to exercise.   Normal pharmacologic nuclear study with no evidence for infarct or ischemia. There was baseline hypertension with severe hypertension at stress.      Echo 08/09/2017 LV EF: 65% -   70% Study Conclusions  - Left ventricle: The cavity size was normal. There was moderate   concentric hypertrophy. Systolic function was vigorous. The   estimated ejection fraction was in the range of 65% to 70%. Wall   motion was normal; there were no regional wall motion   abnormalities. Features are consistent with a pseudonormal left   ventricular filling pattern, with concomitant abnormal relaxation   and increased filling pressure (grade 2 diastolic dysfunction).   Doppler parameters are consistent with high ventricular filling   pressure. - Aortic valve: There was mild stenosis. There was no   regurgitation. Peak velocity (S): 260 cm/s. Mean gradient (S): 14   mm Hg. Valve area (VTI): 1.67 cm^2. Valve area (Vmax): 1.66 cm^2.   Valve area (Vmean): 1.67 cm^2. - Mitral valve: Transvalvular velocity was within the normal range.   There was no evidence for stenosis. There was no regurgitation. - Left atrium: The atrium was severely dilated. - Right ventricle: The cavity size was normal. Wall thickness was   normal. Systolic function was normal. - Atrial septum: No defect or patent foramen ovale was identified   by color flow Doppler. - Tricuspid valve: There was trivial regurgitation. - Pulmonary arteries: Systolic pressure was mildly increased. PA   peak pressure: 44 mm Hg (S).   ASSESSMENT:    1. PAF (paroxysmal atrial fibrillation) ([place])   2. Essential hypertension   3. Encounter for monitoring flecainide therapy   4. Hypothyroidism, unspecified type   5. Bilateral carotid artery  stenosis   6. Hyperlipidemia, unspecified hyperlipidemia type   7. OSA on CPAP   8. Nonrheumatic aortic valve stenosis   9. Gastrointestinal hemorrhage with melena      PLAN:  In order of problems listed above:  1. PAF: Currently on aspirin, off of Emily [name] due to recent recurrent bleed requiring blood transfusion.  Currently maintaining sinus rhythm on metoprolol, diltiazem and flecainide.  2. Recurrent GI bleed: Recently underwent EGD and clipping of a small gastric polyp.  She required blood transfusion.  However shortly after discharge, she went back with another episode of melena.  Given the recurrent bleeding issue, I would recommend hold off on restarting Emily [name] for at least 3 weeks.  Patient will need to close follow-up by Dr. Percival [name].  Potentially consider watchman device.  We also briefly discussed atrial fibrillation ablation as well.  Obtain CBC.  3. Hypertension: Blood pressure was borderline low, according to patient, she is only taking losartan at 50 mg daily instead of 100 mg.  I will reduce losartan to 25 mg daily.  4. Hyperlipidemia: On lovastatin.  Due for repeat lipid panel.  5. Aortic valve stenosis: Mild on last echocardiogram  6. Hypothyroidism: Followed by primary care provider    Medication Adjustments/Labs and Tests Ordered: Current medicines are reviewed at length with the  patient today.  Concerns regarding medicines are outlined above.  Medication changes, Labs and Tests ordered today are listed in the Patient Instructions below. Patient Instructions  Medication Instructions:  Your physician has recommended you make the following change in your medication:  1.  DECREASE the Losartan to 25 mg taking 1 tablet daily  If you need a refill on your cardiac medications before your next appointment, please call your pharmacy.   Lab work: TODAY:  CBC  AT YOUR PCP OFFICE:  FASTING LIPIDS (ORDER IS IN Epic)  If you have labs (blood work) drawn today and your  tests are completely normal, you will receive your results only by: Marland Kitchen MyChart Message (if you have MyChart) OR . A paper copy in the mail If you have any lab test that is abnormal or we need to change your treatment, we will call you to review the results.  Testing/Procedures: None ordered  Follow-Up: At [facility], you and your health needs are our priority.  As part of our continuing mission to provide you with exceptional heart care, we have created designated Provider Care Teams.  These Care Teams include your primary Cardiologist (physician) and Advanced Practice Providers (APPs -  Physician Assistants and Nurse Practitioners) who all work together to provide you with the care you need, when you need it. You will need a follow up appointment in 1 months with Dr. Percival [name] only per Isaac Laud.   Any Other Special Instructions Will Be Listed Below (If Applicable).       Hilbert Corrigan, [place]  03/14/2018 11:49 AM    [address], [place], [place]  [address] Phone: [phone]; Fax: [phone]

## 2018-03-14 NOTE — Patient Instructions (Signed)
Medication Instructions:  Your physician has recommended you make the following change in your medication:  1.  DECREASE the Losartan to 25 mg taking 1 tablet daily  If you need a refill on your cardiac medications before your next appointment, please call your pharmacy.   Lab work: TODAY:  CBC  AT YOUR PCP OFFICE:  FASTING LIPIDS (ORDER IS IN Epic)  If you have labs (blood work) drawn today and your tests are completely normal, you will receive your results only by: Marland Kitchen MyChart Message (if you have MyChart) OR . A paper copy in the mail If you have any lab test that is abnormal or we need to change your treatment, we will call you to review the results.  Testing/Procedures: None ordered  Follow-Up: At [facility], you and your health needs are our priority.  As part of our continuing mission to provide you with exceptional heart care, we have created designated Provider Care Teams.  These Care Teams include your primary Cardiologist (physician) and Advanced Practice Providers (APPs -  Physician Assistants and Nurse Practitioners) who all work together to provide you with the care you need, when you need it. You will need a follow up appointment in 1 months with Dr. Percival Spanish only per Isaac Laud.   Any Other Special Instructions Will Be Listed Below (If Applicable).

## 2018-03-15 NOTE — Progress Notes (Signed)
Red blood cell continue to improve. Hemoglobin has increased about 1 unit compare to 4 days ago.

## 2018-03-16 ENCOUNTER — Encounter: Payer: Self-pay | Admitting: Family Medicine

## 2018-03-16 ENCOUNTER — Ambulatory Visit (INDEPENDENT_AMBULATORY_CARE_PROVIDER_SITE_OTHER): Payer: Medicare Other | Admitting: Family Medicine

## 2018-03-16 ENCOUNTER — Telehealth: Payer: Self-pay

## 2018-03-16 VITALS — BP 140/60 | HR 80 | Temp 96.9°F | Ht 62.0 in | Wt 238.0 lb

## 2018-03-16 DIAGNOSIS — K921 Melena: Secondary | ICD-10-CM

## 2018-03-16 DIAGNOSIS — I480 Paroxysmal atrial fibrillation: Secondary | ICD-10-CM

## 2018-03-16 DIAGNOSIS — Z79899 Other long term (current) drug therapy: Secondary | ICD-10-CM | POA: Diagnosis not present

## 2018-03-16 DIAGNOSIS — I10 Essential (primary) hypertension: Secondary | ICD-10-CM

## 2018-03-16 DIAGNOSIS — Z5181 Encounter for therapeutic drug level monitoring: Secondary | ICD-10-CM | POA: Diagnosis not present

## 2018-03-16 DIAGNOSIS — Z09 Encounter for follow-up examination after completed treatment for conditions other than malignant neoplasm: Secondary | ICD-10-CM

## 2018-03-16 DIAGNOSIS — E876 Hypokalemia: Secondary | ICD-10-CM | POA: Diagnosis not present

## 2018-03-16 LAB — BASIC METABOLIC PANEL
BUN/Creatinine Ratio: 24 (ref 12–28)
BUN: 19 mg/dL (ref 8–27)
CHLORIDE: 97 mmol/L (ref 96–106)
CO2: 24 mmol/L (ref 20–29)
CREATININE: 0.79 mg/dL (ref 0.57–1.00)
Calcium: 9.6 mg/dL (ref 8.7–10.3)
GFR calc Af Amer: 89 mL/min/{1.73_m2} (ref >59)
GFR calc non Af Amer: 77 mL/min/{1.73_m2} (ref >59)
GLUCOSE: 213 mg/dL — AB (ref 65–99)
POTASSIUM: 4.4 mmol/L (ref 3.5–5.2)
SODIUM: 139 mmol/L (ref 134–144)

## 2018-03-16 LAB — LIPID PANEL
CHOLESTEROL TOTAL: 157 mg/dL (ref 100–199)
Chol/HDL Ratio: 3.3 ratio (ref 0.0–4.4)
HDL: 48 mg/dL (ref >39)
LDL Calculated: 80 mg/dL (ref 0–99)
Triglycerides: 147 mg/dL (ref 0–149)
VLDL Cholesterol Cal: 29 mg/dL (ref 5–40)

## 2018-03-16 NOTE — Telephone Encounter (Signed)
FAXED NOTES TO [facility] 

## 2018-03-16 NOTE — Patient Instructions (Signed)
You had labs performed today.  You will be contacted with the results of the labs once they are available, usually in the next 3 business days for routine lab work.   

## 2018-03-16 NOTE — Progress Notes (Signed)
Subjective: CC: Hospital follow up PCP: Emily Norlander, DO ALP:FXTKW Dimare is a 67 y.o. female presenting to clinic today for:  1.  Hospital f/u recurrent GI bleed Patient has been admitted twice in the last month for GI bleed presumed to be related to hyperplastic polyps from the EGD report.  At the time of admission, she was having melena stools.  Her hemoglobin admission was noted to be 7.7.  Her hemoglobin was 8.7 at discharge after transfusion with 2 units of packed red blood cells.  She was seen by her cardiology office 2 days ago for follow-up.  They recommended continuing to hold Eliquis in the setting of recurrent GI bleed.  She is currently being anticoagulated with aspirin only.  Her hemoglobin was rechecked at that visit and was noted to be 9.2.  She has follow-up with her cardiologist in 1 month to determine whether or not she would be a candidate for watchman device.  She has follow-up with her gastroenterologist on 03/25/2018.  She notes that she has been doing okay since discharge from the hospital.  She has not had any melena stool.  No abdominal pain.  No heart palpitations.  Some dyspnea on exertion but she notes that this is with quite a bit of exertion.  This is baseline.  She is currently on a half dose of her blood pressure medicine because her blood pressures were dropping into the 110/60s.  Today is her first half dose.   ROS: Per HPI  Allergies  Allergen Reactions  . Quinapril Hcl Cough  . Statins Other (See Comments)    Not all Statins but some cause cough and pain in legs.  . Tape Other (See Comments)    Redness, please use "paper" tape   Past Medical History:  Diagnosis Date  . Anemia   . Arthritis   . Atrial fibrillation, rapid ([place]) 10/27/2013  . Bilateral carotid artery disease ([place])   . Diabetes mellitus without complication ([place])   . Difficult intubation    states 'lady that did the sleep study told me I have the smallest airway she has  ever seen in an adult"  . Dyslipidemia 06/29/2017  . Dysrhythmia   . Heart murmur   . Hypertension   . Hypothyroid   . Obesity   . Obstructive sleep apnea    on C Pap  . PAF (paroxysmal atrial fibrillation) ([place])    a. newly dx in 09/2013; on eliquis  . UTI (urinary tract infection)     Current Outpatient Medications:  .  aspirin EC 81 MG tablet, Take 1 tablet (81 mg total) by mouth daily., Disp: 90 tablet, Rfl: 3 .  chlorthalidone (HYGROTON) 25 MG tablet, Take 25 mg by mouth daily., Disp: , Rfl: 2 .  CRANBERRY PO, Take 1 tablet by mouth 2 (two) times daily., Disp: , Rfl:  .  diltiazem (CARDIZEM) 30 MG tablet, TAKE 1 TABLET BY MOUTH EVERY DAY, Disp: 90 tablet, Rfl: 1 .  ezetimibe (ZETIA) 10 MG tablet, Take 10 mg by mouth daily., Disp: , Rfl:  .  flecainide (TAMBOCOR) 150 MG tablet, TAKE 0.5 TABLETS (75 MG TOTAL) BY MOUTH 2 (TWO) TIMES DAILY., Disp: 90 tablet, Rfl: 1 .  furosemide (LASIX) 20 MG tablet, Take 1 tablet (20 mg total) by mouth daily as needed for fluid., Disp: 90 tablet, Rfl: 0 .  glipiZIDE (GLUCOTROL) 5 MG tablet, Take 1 tablet (5 mg total) by mouth 2 (two) times daily., Disp: 180 tablet, Rfl:  0 .  levothyroxine (SYNTHROID, LEVOTHROID) 112 MCG tablet, TAKE 1 TABLET BY MOUTH EVERY OTHER DAY, ALTERNATING WITH 1 & 1/2 TABLETS EVERY OTHER DAY, Disp: 112 tablet, Rfl: 1 .  losartan (COZAAR) 25 MG tablet, Take 1 tablet (25 mg total) by mouth daily., Disp: 90 tablet, Rfl: 3 .  lovastatin (MEVACOR) 40 MG tablet, Take 1 tablet (40 mg total) by mouth every evening., Disp: 90 tablet, Rfl: 3 .  MATZIM LA 180 MG 24 hr tablet, TAKE ONE TABLET BY MOUTH ONE TIME DAILY, Disp: 60 tablet, Rfl: 11 .  metFORMIN (GLUCOPHAGE) 1000 MG tablet, Take 1 tablet (1,000 mg total) by mouth 2 (two) times daily with a meal., Disp: 180 tablet, Rfl: 3 .  metoprolol succinate (TOPROL-XL) 100 MG 24 hr tablet, TAKE 1 TABLET BY MOUTH EVERY DAY, Disp: 90 tablet, Rfl: 1 .  oxyCODONE-acetaminophen (PERCOCET/ROXICET)  5-325 MG tablet, Take 0.5-1 tablets by mouth every 8 (eight) hours as needed for severe pain., Disp: 30 tablet, Rfl: 0 .  pantoprazole (PROTONIX) 40 MG tablet, Take 1 tablet by mouth daily., Disp: , Rfl:  .  PRESCRIPTION MEDICATION, Apply 1 application topically daily., Disp: , Rfl:  .  rOPINIRole (REQUIP) 0.5 MG tablet, Take 1 tablet (0.5 mg total) by mouth at bedtime., Disp: 90 tablet, Rfl: 1  Current Facility-Administered Medications:  .  cyanocobalamin ((VITAMIN B-12)) injection 1,000 mcg, 1,000 mcg, Intramuscular, Q30 days, Timmothy Euler, MD, 1,000 mcg at 02/22/18 1015 Social History   Socioeconomic History  . Marital status: Single    Spouse name: Not on file  . Number of children: Not on file  . Years of education: Not on file  . Highest education level: Not on file  Occupational History  . Not on file  Social Needs  . Financial resource strain: Not on file  . Food insecurity:    Worry: Not on file    Inability: Not on file  . Transportation needs:    Medical: No    Non-medical: No  Tobacco Use  . Smoking status: Never Smoker  . Smokeless tobacco: Never Used  Substance and Sexual Activity  . Alcohol use: No    Alcohol/week: 0.0 standard drinks  . Drug use: Never  . Sexual activity: Not on file  Lifestyle  . Physical activity:    Days per week: 0 days    Minutes per session: 0 min  . Stress: Not at all  Relationships  . Social connections:    Talks on phone: Twice a week    Gets together: Twice a week    Attends religious service: More than 4 times per year    Active member of club or organization: No    Attends meetings of clubs or organizations: Never    Relationship status: Never married  . Intimate partner violence:    Fear of current or ex partner: No    Emotionally abused: No    Physically abused: No    Forced sexual activity: No  Other Topics Concern  . Not on file  Social History Narrative  . Not on file   Family History  Problem Relation  Age of Onset  . COPD Father   . Heart failure Father   . Heart disease Father   . Emphysema Father   . Arrhythmia Sister   . Arrhythmia Sister   . Arrhythmia Sister        had PPM also  . Cancer Sister     Objective: Office vital signs  reviewed. BP 140/60   Pulse 80   Temp (!) 96.9 F (36.1 C) (Oral)   Ht 5\' 2"  (1.575 m)   Wt 238 lb (108 kg)   BMI 43.53 kg/m   Physical Examination:  General: Awake, alert, well nourished, color appears better today. No acute distress HEENT: Normal, MMM Cardio: regular rate and rhythm, S1S2 heard, 2/6 SEM appreciated Pulm: clear to auscultation bilaterally, no wheezes, rhonchi or rales; normal work of breathing on room air Extremities: Brisk capillary refill.  Assessment/ Plan: 67 y.o. female   1. Hypokalemia We will recheck a BMP given hyperkalemia noted in hospital and need for supplementation during that time. - Basic Metabolic Panel  2. Gastrointestinal hemorrhage with melena I reviewed her recent hemoglobin level which was 9.2, obtained 2 days ago.  She has had no recurrence of melena.  She has follow-up with GI next week.  3. Hospital discharge follow-up I reviewed her hospital course, labs. - Basic Metabolic Panel  4. PAF (paroxysmal atrial fibrillation) ([facility]) Currently rate and rhythm controlled.  Only on aspirin.  Awaiting decision between cardiologist and patient pertaining to long term anticoagulation versus procedure. - Lipid panel  5. Essential hypertension Blood pressure within normal limits today.  Continue monitoring daily. - Lipid panel  6. Encounter for monitoring flecainide therapy Patient's cardiologist placed order for a lipid panel.  She will have this obtained today. - Lipid panel   Orders Placed This Encounter  Procedures  . Basic Metabolic Panel   No orders of the defined types were placed in this encounter.    Emily Norlander, DO [place] [phone]

## 2018-03-17 ENCOUNTER — Telehealth: Payer: Self-pay

## 2018-03-17 NOTE — Telephone Encounter (Signed)
-----   Message from [place], [place] sent at 03/17/2018  1:55 PM EDT ----- Cholesterol well controlled, improved compare to 6 month ago

## 2018-03-17 NOTE — Telephone Encounter (Signed)
Called patient, gave results.  ?Patient verbalized understanding.  ? ? ?

## 2018-03-17 NOTE — Progress Notes (Signed)
Cholesterol well controlled, improved compare to 6 month ago

## 2018-03-22 ENCOUNTER — Ambulatory Visit (INDEPENDENT_AMBULATORY_CARE_PROVIDER_SITE_OTHER): Payer: Medicare Other | Admitting: *Deleted

## 2018-03-22 DIAGNOSIS — E538 Deficiency of other specified B group vitamins: Secondary | ICD-10-CM

## 2018-03-22 DIAGNOSIS — Z23 Encounter for immunization: Secondary | ICD-10-CM | POA: Diagnosis not present

## 2018-03-22 NOTE — Progress Notes (Signed)
Pt given Cyanocobalamin inj and flu vaccine

## 2018-03-25 ENCOUNTER — Other Ambulatory Visit: Payer: Self-pay | Admitting: *Deleted

## 2018-03-25 DIAGNOSIS — K921 Melena: Secondary | ICD-10-CM | POA: Diagnosis not present

## 2018-03-25 MED ORDER — LEVOTHYROXINE SODIUM 112 MCG PO TABS: ORAL_TABLET | ORAL | 3 refills | Status: DC

## 2018-03-29 ENCOUNTER — Ambulatory Visit: Payer: Medicare Other

## 2018-04-04 ENCOUNTER — Encounter: Payer: Self-pay | Admitting: Family Medicine

## 2018-04-04 ENCOUNTER — Ambulatory Visit (INDEPENDENT_AMBULATORY_CARE_PROVIDER_SITE_OTHER): Payer: Medicare Other | Admitting: Family Medicine

## 2018-04-04 VITALS — BP 149/64 | HR 66 | Temp 98.4°F | Ht 62.0 in | Wt 237.0 lb

## 2018-04-04 DIAGNOSIS — L02214 Cutaneous abscess of groin: Secondary | ICD-10-CM | POA: Diagnosis not present

## 2018-04-04 MED ORDER — SULFAMETHOXAZOLE-TRIMETHOPRIM 800-160 MG PO TABS: 1.0000 | ORAL_TABLET | Freq: Two times a day (BID) | ORAL | 0 refills | Status: AC

## 2018-04-04 NOTE — Progress Notes (Signed)
Subjective:    Patient ID: Emily [name], female    DOB: June 30, 1950, 67 y.o.   MRN: [id number]  Chief Complaint:  Abcess in groin area   HPI: Emily [name] is a 67 y.o. female presenting on 04/04/2018 for Abcess in groin area   1. Abscess of right groin   Pt presents today for an abscess to her right groin. Pt states this started on Thursday and has become worse. Pt states it started out as a hard knot and then enlarged and started draining. States it has been draining since Friday. States she has been applying warm compresses to the area to encourage draining. Pt states the area is very tender to touch. Denies fever or chills.    Relevant past medical, surgical, family, and social history reviewed and updated as indicated.  Allergies and medications reviewed and updated.   Past Medical History:  Diagnosis Date  . Anemia   . Arthritis   . Atrial fibrillation, rapid ([place]) 10/27/2013  . Bilateral carotid artery disease ([place])   . Diabetes mellitus without complication ([place])   . Difficult intubation    states 'lady that did the sleep study told me I have the smallest airway she has ever seen in an adult"  . Dyslipidemia 06/29/2017  . Dysrhythmia   . Heart murmur   . Hypertension   . Hypothyroid   . Obesity   . Obstructive sleep apnea    on C Pap  . PAF (paroxysmal atrial fibrillation) ([place])    a. newly dx in 09/2013; on eliquis  . UTI (urinary tract infection)     Past Surgical History:  Procedure Laterality Date  . CATARACT EXTRACTION Left   . CATARACT EXTRACTION W/PHACO Right 12/09/2015   Procedure: CATARACT EXTRACTION PHACO AND INTRAOCULAR LENS PLACEMENT ([facility]);  Surgeon: Tonny Branch, MD;  Location: [facility];  Service: Ophthalmology;  Laterality: Right;  CDE:10.48  . COLONOSCOPY W/ POLYPECTOMY    . cyst on back of neck removed    . DILATION AND CURETTAGE OF UTERUS     x 5  . ENDARTERECTOMY Left 08/30/2015   Procedure: LEFT CAROYID ENDARTERECTOMY WITH XENOSURE BOVINE  PERICARDIUM PATCH ANGIOPLASTY;  Surgeon: [name], MD;  Location: [facility];  Service: Vascular;  Laterality: Left;  . EYE SURGERY    . TONSILLECTOMY    . UPPER GI ENDOSCOPY      Social History   Socioeconomic History  . Marital status: Single    Spouse name: Not on file  . Number of children: Not on file  . Years of education: Not on file  . Highest education level: Not on file  Occupational History  . Not on file  Social Needs  . Financial resource strain: Not on file  . Food insecurity:    Worry: Not on file    Inability: Not on file  . Transportation needs:    Medical: No    Non-medical: No  Tobacco Use  . Smoking status: Never Smoker  . Smokeless tobacco: Never Used  Substance and Sexual Activity  . Alcohol use: No    Alcohol/week: 0.0 standard drinks  . Drug use: Never  . Sexual activity: Not on file  Lifestyle  . Physical activity:    Days per week: 0 days    Minutes per session: 0 min  . Stress: Not at all  Relationships  . Social connections:    Talks on phone: Twice a week    Gets together: Twice a  week    Attends religious service: More than 4 times per year    Active member of club or organization: No    Attends meetings of clubs or organizations: Never    Relationship status: Never married  . Intimate partner violence:    Fear of current or ex partner: No    Emotionally abused: No    Physically abused: No    Forced sexual activity: No  Other Topics Concern  . Not on file  Social History Narrative  . Not on file    Outpatient Encounter Medications as of 04/04/2018  Medication Sig  . aspirin EC 81 MG tablet Take 1 tablet (81 mg total) by mouth daily.  . chlorthalidone (HYGROTON) 25 MG tablet Take 25 mg by mouth daily.  Marland Kitchen CRANBERRY PO Take 1 tablet by mouth 2 (two) times daily.  Marland Kitchen diltiazem (CARDIZEM) 30 MG tablet TAKE 1 TABLET BY MOUTH EVERY DAY  . ezetimibe (ZETIA) 10 MG tablet Take 10 mg by mouth daily.  . flecainide (TAMBOCOR) 150 MG  tablet TAKE 0.5 TABLETS (75 MG TOTAL) BY MOUTH 2 (TWO) TIMES DAILY.  . furosemide (LASIX) 20 MG tablet Take 1 tablet (20 mg total) by mouth daily as needed for fluid.  Marland Kitchen glipiZIDE (GLUCOTROL) 5 MG tablet Take 1 tablet (5 mg total) by mouth 2 (two) times daily.  Marland Kitchen levothyroxine (SYNTHROID, LEVOTHROID) 112 MCG tablet TAKE 1 TABLET BY MOUTH EVERY OTHER DAY, ALTERNATING WITH 1 & 1/2 TABLETS EVERY OTHER DAY  . losartan (COZAAR) 25 MG tablet Take 1 tablet (25 mg total) by mouth daily.  Marland Kitchen lovastatin (MEVACOR) 40 MG tablet Take 1 tablet (40 mg total) by mouth every evening.  Marland Kitchen MATZIM LA 180 MG 24 hr tablet TAKE ONE TABLET BY MOUTH ONE TIME DAILY  . metFORMIN (GLUCOPHAGE) 1000 MG tablet Take 1 tablet (1,000 mg total) by mouth 2 (two) times daily with a meal.  . metoprolol succinate (TOPROL-XL) 100 MG 24 hr tablet TAKE 1 TABLET BY MOUTH EVERY DAY  . oxyCODONE-acetaminophen (PERCOCET/ROXICET) 5-325 MG tablet Take 0.5-1 tablets by mouth every 8 (eight) hours as needed for severe pain.  . pantoprazole (PROTONIX) 40 MG tablet Take 1 tablet by mouth daily.  Marland Kitchen PRESCRIPTION MEDICATION Apply 1 application topically daily.  Marland Kitchen rOPINIRole (REQUIP) 0.5 MG tablet Take 1 tablet (0.5 mg total) by mouth at bedtime.  . sulfamethoxazole-trimethoprim (BACTRIM DS) 800-160 MG tablet Take 1 tablet by mouth 2 (two) times daily for 7 days.   Facility-Administered Encounter Medications as of 04/04/2018  Medication  . cyanocobalamin ((VITAMIN B-12)) injection 1,000 mcg    Allergies  Allergen Reactions  . Quinapril Hcl Cough  . Statins Other (See Comments)    Not all Statins but some cause cough and pain in legs.  . Tape Other (See Comments)    Redness, please use "paper" tape    Review of Systems  Constitutional: Negative for chills, fatigue and fever.  Respiratory: Negative for shortness of breath.   Cardiovascular: Negative for chest pain and palpitations.  Gastrointestinal: Negative for abdominal pain, constipation,  nausea and vomiting.  Skin: Positive for wound (abscess to right groin ).  All other systems reviewed and are negative.       Objective:    Ht 5\' 2"  (1.575 m)   Wt 237 lb (107.5 kg)   BMI 43.35 kg/m    Wt Readings from Last 3 Encounters:  04/04/18 237 lb (107.5 kg)  03/16/18 238 lb (108 kg)  03/14/18 233  lb (105.7 kg)    Physical Exam  Constitutional: She is oriented to person, place, and time. She appears well-developed and well-nourished. She is cooperative. No distress.  HENT:  Head: Normocephalic.  Cardiovascular: Normal rate and regular rhythm.  Murmur heard.  Systolic murmur is present with a grade of 2/6. Pulmonary/Chest: Effort normal and breath sounds normal. No respiratory distress.  Abdominal: Soft. Bowel sounds are normal. There is no tenderness.  Neurological: She is alert and oriented to person, place, and time.  Skin: Skin is warm and dry. Capillary refill takes less than 2 seconds.     Psychiatric: She has a normal mood and affect. Her speech is normal and behavior is normal. Judgment and thought content normal. Cognition and memory are normal.  Nursing note and vitals reviewed.        Pertinent labs & imaging results that were available during my care of the patient were reviewed by me and considered in my medical decision making.  Assessment & Plan:  Briteny was seen today for abcess in groin area.  Diagnoses and all orders for this visit:  Abscess of right groin Sitz baths. Allow to drain. Do not pick or squeeze area. Medications as prescribed.  -     sulfamethoxazole-trimethoprim (BACTRIM DS) 800-160 MG tablet; Take 1 tablet by mouth 2 (two) times daily for 7 days.     Continue all other maintenance medications.  Follow up plan: Return in about 2 weeks (around 04/18/2018), or if symptoms worsen or fail to improve.  Educational handout given for sitz bath, abscess   The above assessment and management plan was discussed with the patient.  The patient verbalized understanding of and has agreed to the management plan. Patient is aware to call the clinic if symptoms persist or worsen. Patient is aware when to return to the clinic for a follow-up visit. Patient educated on when it is appropriate to go to the emergency department.   Monia Pouch, FNP-C [facility] [phone]

## 2018-04-04 NOTE — Patient Instructions (Signed)
How to Take a Sitz Bath A sitz bath is a warm water bath that is taken while you are sitting down. The water should only come up to your hips and should cover your buttocks. Your health care provider may recommend a sitz bath to help you:  Clean the lower part of your body, including your genital area.  With itching.  With pain.  With sore muscles or muscles that tighten or spasm.  How to take a sitz bath Take 3-4 sitz baths per day or as told by your health care provider. 1. Partially fill a bathtub with warm water. You will only need the water to be deep enough to cover your hips and buttocks when you are sitting in it. 2. If your health care provider told you to put medicine in the water, follow the directions exactly. 3. Sit in the water and open the tub drain a little. 4. Turn on the warm water again to keep the tub at the correct level. Keep the water running constantly. 5. Soak in the water for 15-20 minutes or as told by your health care provider. 6. After the sitz bath, pat the affected area dry first. Do not rub it. 7. Be careful when you stand up after the sitz bath because you may feel dizzy.  Contact a health care provider if:  Your symptoms get worse. Do not continue with sitz baths if your symptoms get worse.  You have new symptoms. Do not continue with sitz baths until you talk with your health care provider. This information is not intended to replace advice given to you by your health care provider. Make sure you discuss any questions you have with your health care provider. Document Released: 02/08/2004 Document Revised: 10/16/2015 Document Reviewed: 05/16/2014 Elsevier Interactive Patient Education  2018 [employer]. Skin Abscess A skin abscess is an infected area on or under your skin that contains pus and other material. An abscess can happen almost anywhere on your body. Some abscesses break open (rupture) on their own. Most continue to get worse unless they are  treated. The infection can spread deeper into the body and into your blood, which can make you feel sick. Treatment usually involves draining the abscess. Follow these instructions at home: Abscess Care  If you have an abscess that has not drained, place a warm, clean, wet washcloth over the abscess several times a day. Do this as told by your doctor.  Follow instructions from your doctor about how to take care of your abscess. Make sure you: ? Cover the abscess with a bandage (dressing). ? Change your bandage or gauze as told by your doctor. ? Wash your hands with soap and water before you change the bandage or gauze. If you cannot use soap and water, use hand sanitizer.  Check your abscess every day for signs that the infection is getting worse. Check for: ? More redness, swelling, or pain. ? More fluid or blood. ? Warmth. ? More pus or a bad smell. Medicines   Take over-the-counter and prescription medicines only as told by your doctor.  If you were prescribed an antibiotic medicine, take it as told by your doctor. Do not stop taking the antibiotic even if you start to feel better. General instructions  To avoid spreading the infection: ? Do not share personal care items, towels, or hot tubs with others. ? Avoid making skin-to-skin contact with other people.  Keep all follow-up visits as told by your doctor. This is  important. Contact a doctor if:  You have more redness, swelling, or pain around your abscess.  You have more fluid or blood coming from your abscess.  Your abscess feels warm when you touch it.  You have more pus or a bad smell coming from your abscess.  You have a fever.  Your muscles ache.  You have chills.  You feel sick. Get help right away if:  You have very bad (severe) pain.  You see red streaks on your skin spreading away from the abscess. This information is not intended to replace advice given to you by your health care provider. Make sure  you discuss any questions you have with your health care provider. Document Released: 11/04/2007 Document Revised: 01/12/2016 Document Reviewed: 03/27/2015 Elsevier Interactive Patient Education  [employer].

## 2018-04-08 ENCOUNTER — Institutional Professional Consult (permissible substitution): Payer: Medicare Other | Admitting: Internal Medicine

## 2018-04-17 NOTE — Progress Notes (Signed)
HPI The patient presents for followup of atrial fib.   The patient has paroxysmal atrial fibrillation and is being treated with flecainide. Since I last saw her she was admitted to [facility] in October with anemia and was found to have a gastric polyp that was clipped.   She was sent out on Eliquis.  She had continued bleeding and was readmitted.  Eliquis was stopped and she had a repeat endoscopy.   She has been seen since her last office visit here she has been seen by GI.  I reviewed these records for this visit.    There remarks are listed below.    She has not had any tachypalpitations.  She says she typically knows when she is in atrial fibrillation.  She has not had any presyncope or syncope.  Her profound fatigue is a little bit better as her hemoglobin has gone up.  She has not had any recurrent bleeding now just taking aspirin.  She denies any chest pressure, neck or arm discomfort.  She is had no shortness of breath, PND or orthopnea.  She denies weight gain or edema.   Allergies  Allergen Reactions  . Quinapril Hcl Cough  . Statins Other (See Comments)    Not all Statins but some cause cough and pain in legs.  . Tape Other (See Comments)    Redness, please use "paper" tape    Current Outpatient Medications  Medication Sig Dispense Refill  . amoxicillin-clavulanate (AUGMENTIN) 875-125 MG tablet Take 1 tablet by mouth 2 (two) times daily for 7 days. 14 tablet 0  . aspirin EC 81 MG tablet Take 1 tablet (81 mg total) by mouth daily. 90 tablet 3  . chlorthalidone (HYGROTON) 25 MG tablet Take 25 mg by mouth daily.  2  . CRANBERRY PO Take 1 tablet by mouth 2 (two) times daily.    Marland Kitchen diltiazem (CARDIZEM) 30 MG tablet TAKE 1 TABLET BY MOUTH EVERY DAY 90 tablet 1  . ezetimibe (ZETIA) 10 MG tablet Take 10 mg by mouth daily.    . flecainide (TAMBOCOR) 150 MG tablet TAKE 0.5 TABLETS (75 MG TOTAL) BY MOUTH 2 (TWO) TIMES DAILY. 90 tablet 1  . furosemide (LASIX) 20 MG tablet Take 1 tablet (20 mg  total) by mouth daily as needed for fluid. 90 tablet 0  . glipiZIDE (GLUCOTROL) 5 MG tablet Take 1 tablet (5 mg total) by mouth 2 (two) times daily. 180 tablet 0  . levothyroxine (SYNTHROID, LEVOTHROID) 112 MCG tablet TAKE 1 TABLET BY MOUTH EVERY OTHER DAY, ALTERNATING WITH 1 & 1/2 TABLETS EVERY OTHER DAY 112 tablet 3  . losartan (COZAAR) 25 MG tablet Take 1 tablet (25 mg total) by mouth daily. 90 tablet 3  . lovastatin (MEVACOR) 40 MG tablet Take 1 tablet (40 mg total) by mouth every evening. 90 tablet 3  . MATZIM LA 180 MG 24 hr tablet TAKE ONE TABLET BY MOUTH ONE TIME DAILY 60 tablet 11  . metFORMIN (GLUCOPHAGE) 1000 MG tablet Take 1 tablet (1,000 mg total) by mouth 2 (two) times daily with a meal. 180 tablet 3  . metoprolol succinate (TOPROL-XL) 100 MG 24 hr tablet TAKE 1 TABLET BY MOUTH EVERY DAY 90 tablet 1  . oxyCODONE-acetaminophen (PERCOCET/ROXICET) 5-325 MG tablet Take 0.5-1 tablets by mouth every 8 (eight) hours as needed for severe pain. 30 tablet 0  . pantoprazole (PROTONIX) 40 MG tablet Take 1 tablet by mouth daily.    Marland Kitchen PRESCRIPTION MEDICATION Apply 1 application topically daily.    Marland Kitchen  rOPINIRole (REQUIP) 0.5 MG tablet Take 1 tablet (0.5 mg total) by mouth at bedtime. 90 tablet 1   Current Facility-Administered Medications  Medication Dose Route Frequency Provider Last Rate Last Dose  . cyanocobalamin ((VITAMIN B-12)) injection 1,000 mcg  1,000 mcg Intramuscular Q30 days Timmothy Euler, MD   1,000 mcg at 04/19/18 1012    Past Medical History:  Diagnosis Date  . Anemia   . Arthritis   . Atrial fibrillation, rapid ([place]) 10/27/2013  . Bilateral carotid artery disease ([place])   . Diabetes mellitus without complication ([place])   . Difficult intubation    states 'lady that did the sleep study told me I have the smallest airway she has ever seen in an adult"  . Dyslipidemia 06/29/2017  . Dysrhythmia   . Heart murmur   . Hypertension   . Hypothyroid   . Obesity   . Obstructive  sleep apnea    on C Pap  . PAF (paroxysmal atrial fibrillation) ([place])    a. newly dx in 09/2013; on eliquis  . UTI (urinary tract infection)     Past Surgical History:  Procedure Laterality Date  . CATARACT EXTRACTION Left   . CATARACT EXTRACTION W/PHACO Right 12/09/2015   Procedure: CATARACT EXTRACTION PHACO AND INTRAOCULAR LENS PLACEMENT (IOC);  Surgeon: Tonny Branch, MD;  Location: [facility];  Service: Ophthalmology;  Laterality: Right;  CDE:10.48  . COLONOSCOPY W/ POLYPECTOMY    . cyst on back of neck removed    . DILATION AND CURETTAGE OF UTERUS     x 5  . ENDARTERECTOMY Left 08/30/2015   Procedure: LEFT CAROYID ENDARTERECTOMY WITH XENOSURE BOVINE PERICARDIUM PATCH ANGIOPLASTY;  Surgeon: [name], MD;  Location: [place];  Service: Vascular;  Laterality: Left;  . EYE SURGERY    . TONSILLECTOMY    . UPPER GI ENDOSCOPY      ROS:  As stated in the HPI and negative for all other systems.  PHYSICAL EXAM BP (!) 152/60   Pulse (!) 57   Ht 5\' 2"  (1.575 m)   Wt 237 lb 6.4 oz (107.7 kg)   BMI 43.42 kg/m   GENERAL:  Well appearing NECK:  No jugular venous distention, waveform within normal limits, carotid upstroke brisk and symmetric, no bruits, no thyromegaly LUNGS:  Clear to auscultation bilaterally CHEST:  Unremarkable HEART:  PMI not displaced or sustained,S1 and S2 within normal limits, no S3, no S4, no clicks, no rubs, no murmurs ABD:  Flat, positive bowel sounds normal in frequency in pitch, no bruits, no rebound, no guarding, no midline pulsatile mass, no hepatomegaly, no splenomegaly EXT:  2 plus pulses throughout, no edema, no cyanosis no clubbing   EKG:   NA   ASSESSMENT AND PLAN  ATRIAL FIB:   .  [name] has a CHA2DS2 - VASc score of 4 with a risk of stroke of 4%.   I did have a conversation with our structural heart team.  She clearly would be a candidate for Watchman.  However, we have to decide where we will do this here versus [facility].  For now I  am going to restart Eliquis although she would had to be on this for a period of time surrounding her procedure.  GI BLEEDING:  It was suggested that she would likely have recurrent bleeding from gastric polyps and possibly AVMs.   This is addressed as above.  HTN: Blood pressure is elevated today but is been running low recently so I  will not change her medications.  She will keep an eye on this at home.    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   She has an appointment in March.  OBESITY:   She understands the need for diet and weight loss.  DM: A1c is 8.1.  This is followed by her primary provider.   [facility] records reviewed, GI records reviewed.

## 2018-04-19 ENCOUNTER — Encounter: Payer: Self-pay | Admitting: Family Medicine

## 2018-04-19 ENCOUNTER — Ambulatory Visit (INDEPENDENT_AMBULATORY_CARE_PROVIDER_SITE_OTHER): Payer: Medicare Other | Admitting: Family Medicine

## 2018-04-19 ENCOUNTER — Encounter: Payer: Self-pay | Admitting: Cardiology

## 2018-04-19 ENCOUNTER — Ambulatory Visit (INDEPENDENT_AMBULATORY_CARE_PROVIDER_SITE_OTHER): Payer: Medicare Other | Admitting: Cardiology

## 2018-04-19 ENCOUNTER — Ambulatory Visit: Payer: Medicare Other

## 2018-04-19 VITALS — BP 123/60 | HR 58 | Temp 97.3°F | Ht 61.0 in | Wt 238.0 lb

## 2018-04-19 VITALS — BP 152/60 | HR 57 | Ht 62.0 in | Wt 237.4 lb

## 2018-04-19 DIAGNOSIS — I10 Essential (primary) hypertension: Secondary | ICD-10-CM

## 2018-04-19 DIAGNOSIS — I480 Paroxysmal atrial fibrillation: Secondary | ICD-10-CM | POA: Diagnosis not present

## 2018-04-19 DIAGNOSIS — L0291 Cutaneous abscess, unspecified: Secondary | ICD-10-CM | POA: Diagnosis not present

## 2018-04-19 DIAGNOSIS — Z79899 Other long term (current) drug therapy: Secondary | ICD-10-CM

## 2018-04-19 DIAGNOSIS — E538 Deficiency of other specified B group vitamins: Secondary | ICD-10-CM | POA: Diagnosis not present

## 2018-04-19 DIAGNOSIS — I6523 Occlusion and stenosis of bilateral carotid arteries: Secondary | ICD-10-CM | POA: Diagnosis not present

## 2018-04-19 DIAGNOSIS — R196 Halitosis: Secondary | ICD-10-CM

## 2018-04-19 MED ORDER — AMOXICILLIN-POT CLAVULANATE 875-125 MG PO TABS: 1.0000 | ORAL_TABLET | Freq: Two times a day (BID) | ORAL | 0 refills | Status: AC

## 2018-04-19 NOTE — Patient Instructions (Signed)
Medication Instructions:  Continue current medications  If you need a refill on your cardiac medications before your next appointment, please call your pharmacy.  Labwork: CBC HERE IN OUR OFFICE AT [facility]  Take the provided lab slips with you to the lab for your blood draw.   You will NOT need to fast   If you have labs (blood work) drawn today and your tests are completely normal, you will receive your results only by: Marland Kitchen MyChart Message (if you have MyChart) OR . A paper copy in the mail If you have any lab test that is abnormal or we need to change your treatment, we will call you to review the results.  Testing/Procedures: None Ordered  Follow-Up: . You will need a follow up appointment in As Needed.   At [facility], you and your health needs are our priority.  As part of our continuing mission to provide you with exceptional heart care, we have created designated Provider Care Teams.  These Care Teams include your primary Cardiologist (physician) and Advanced Practice Providers (APPs -  Physician Assistants and Nurse Practitioners) who all work together to provide you with the care you need, when you need it.   Thank you for choosing [facility] at [facility]!!

## 2018-04-19 NOTE — Progress Notes (Signed)
Subjective: CC: Abscess PCP: Janora Norlander, DO [id number] Yurick is a 67 y.o. female presenting to clinic today for:  1.  Abscess Patient reports ongoing issues with abscess in the right groin area.  She was seen a couple of weeks ago and placed on Septra.  She does note great amount of improvement with the Septra but states that she continues to have swelling in the right groin.  Denies any continued drainage.  No fevers, chills.  Tenderness has improved greatly.  She is not able to perform sitz bath at home but has been applying warm compresses. 2.  Halitosis Patient reports is been a long-standing issue for her.  She had her tonsils removed.  She continues to have bad breath despite good dental hygiene.  She does admit to not seeing the dentist frequently but states that when she has seen him in the past she continued to have breath problems.  She wonders if it coming from her sinuses or stomach.  ROS: Per HPI  Allergies  Allergen Reactions  . Quinapril Hcl Cough  . Statins Other (See Comments)    Not all Statins but some cause cough and pain in legs.  . Tape Other (See Comments)    Redness, please use "paper" tape   Past Medical History:  Diagnosis Date  . Anemia   . Arthritis   . Atrial fibrillation, rapid ([place]) 10/27/2013  . Bilateral carotid artery disease ([name])   . Diabetes mellitus without complication ([place])   . Difficult intubation    states 'lady that did the sleep study told me I have the smallest airway she has ever seen in an adult"  . Dyslipidemia 06/29/2017  . Dysrhythmia   . Heart murmur   . Hypertension   . Hypothyroid   . Obesity   . Obstructive sleep apnea    on C Pap  . PAF (paroxysmal atrial fibrillation) ([place])    a. newly dx in 09/2013; on eliquis  . UTI (urinary tract infection)     Current Outpatient Medications:  .  aspirin EC 81 MG tablet, Take 1 tablet (81 mg total) by mouth daily., Disp: 90 tablet, Rfl: 3 .  chlorthalidone (HYGROTON)  25 MG tablet, Take 25 mg by mouth daily., Disp: , Rfl: 2 .  CRANBERRY PO, Take 1 tablet by mouth 2 (two) times daily., Disp: , Rfl:  .  diltiazem (CARDIZEM) 30 MG tablet, TAKE 1 TABLET BY MOUTH EVERY DAY, Disp: 90 tablet, Rfl: 1 .  ezetimibe (ZETIA) 10 MG tablet, Take 10 mg by mouth daily., Disp: , Rfl:  .  flecainide (TAMBOCOR) 150 MG tablet, TAKE 0.5 TABLETS (75 MG TOTAL) BY MOUTH 2 (TWO) TIMES DAILY., Disp: 90 tablet, Rfl: 1 .  furosemide (LASIX) 20 MG tablet, Take 1 tablet (20 mg total) by mouth daily as needed for fluid., Disp: 90 tablet, Rfl: 0 .  glipiZIDE (GLUCOTROL) 5 MG tablet, Take 1 tablet (5 mg total) by mouth 2 (two) times daily., Disp: 180 tablet, Rfl: 0 .  levothyroxine (SYNTHROID, LEVOTHROID) 112 MCG tablet, TAKE 1 TABLET BY MOUTH EVERY OTHER DAY, ALTERNATING WITH 1 & 1/2 TABLETS EVERY OTHER DAY, Disp: 112 tablet, Rfl: 3 .  losartan (COZAAR) 25 MG tablet, Take 1 tablet (25 mg total) by mouth daily., Disp: 90 tablet, Rfl: 3 .  lovastatin (MEVACOR) 40 MG tablet, Take 1 tablet (40 mg total) by mouth every evening., Disp: 90 tablet, Rfl: 3 .  MATZIM LA 180 MG 24 hr tablet, TAKE  ONE TABLET BY MOUTH ONE TIME DAILY, Disp: 60 tablet, Rfl: 11 .  metFORMIN (GLUCOPHAGE) 1000 MG tablet, Take 1 tablet (1,000 mg total) by mouth 2 (two) times daily with a meal., Disp: 180 tablet, Rfl: 3 .  metoprolol succinate (TOPROL-XL) 100 MG 24 hr tablet, TAKE 1 TABLET BY MOUTH EVERY DAY, Disp: 90 tablet, Rfl: 1 .  oxyCODONE-acetaminophen (PERCOCET/ROXICET) 5-325 MG tablet, Take 0.5-1 tablets by mouth every 8 (eight) hours as needed for severe pain., Disp: 30 tablet, Rfl: 0 .  pantoprazole (PROTONIX) 40 MG tablet, Take 1 tablet by mouth daily., Disp: , Rfl:  .  PRESCRIPTION MEDICATION, Apply 1 application topically daily., Disp: , Rfl:  .  rOPINIRole (REQUIP) 0.5 MG tablet, Take 1 tablet (0.5 mg total) by mouth at bedtime., Disp: 90 tablet, Rfl: 1  Current Facility-Administered Medications:  .   cyanocobalamin ((VITAMIN B-12)) injection 1,000 mcg, 1,000 mcg, Intramuscular, Q30 days, Timmothy Euler, MD, 1,000 mcg at 03/22/18 1052 Social History   Socioeconomic History  . Marital status: Single    Spouse name: Not on file  . Number of children: Not on file  . Years of education: Not on file  . Highest education level: Not on file  Occupational History  . Not on file  Social Needs  . Financial resource strain: Not on file  . Food insecurity:    Worry: Not on file    Inability: Not on file  . Transportation needs:    Medical: No    Non-medical: No  Tobacco Use  . Smoking status: Never Smoker  . Smokeless tobacco: Never Used  Substance and Sexual Activity  . Alcohol use: No    Alcohol/week: 0.0 standard drinks  . Drug use: Never  . Sexual activity: Not on file  Lifestyle  . Physical activity:    Days per week: 0 days    Minutes per session: 0 min  . Stress: Not at all  Relationships  . Social connections:    Talks on phone: Twice a week    Gets together: Twice a week    Attends religious service: More than 4 times per year    Active member of club or organization: No    Attends meetings of clubs or organizations: Never    Relationship status: Never married  . Intimate partner violence:    Fear of current or ex partner: No    Emotionally abused: No    Physically abused: No    Forced sexual activity: No  Other Topics Concern  . Not on file  Social History Narrative  . Not on file   Family History  Problem Relation Age of Onset  . COPD Father   . Heart failure Father   . Heart disease Father   . Emphysema Father   . Arrhythmia Sister   . Arrhythmia Sister   . Arrhythmia Sister        had PPM also  . Cancer Sister     Objective: Office vital signs reviewed. BP 123/60   Pulse (!) 58   Temp (!) 97.3 F (36.3 C) (Oral)   Ht 5\' 1"  (1.549 m)   Wt 238 lb (108 kg)   BMI 44.97 kg/m   Physical Examination:  General: Awake, alert, well nourished,  No acute distress HEENT: sclera white, MM slightly dry. No fetid odor appreciated. Skin: Approximately 1.25 inch x 0.5 inch area of induration along the right inner thigh just next to the labia majora.  There is associated  maceration of the skin.  No active drainage.  Mild erythema and increased warmth noted.  No palpable fluctuance.  Assessment/ Plan: 67 y.o. female   1. Abscess Indurated but no palpable fluctuance.  No active drainage.  I recommend that she continue warm compresses.  I have prescribed her Augmentin 875 to take p.o. twice daily for the next 7 days.  Home care instructions were reviewed.  Reasons return discussed.  Follow-up PRN.  2. Halitosis Not appreciable on today's exam but patient reports this at home.  We discussed dental hygiene and Biotene for dry mouth.  Could consider referral to ENT for further evaluation at some point.  Patient will think about this.  Follow-up PRN.  3. B12 deficiency B12 injection given today.   No orders of the defined types were placed in this encounter.  Meds ordered this encounter  Medications  . amoxicillin-clavulanate (AUGMENTIN) 875-125 MG tablet    Sig: Take 1 tablet by mouth 2 (two) times daily for 7 days.    Dispense:  14 tablet    Refill:  [place], DO [place] [phone]

## 2018-04-19 NOTE — Progress Notes (Signed)
HPI The patient presents for followup of atrial fib.   The patient has paroxysmal atrial fibrillation and is being treated with flecainide. Since I last saw her she was admitted to [facility] in October with anemia and was found to have a gastric polyp that was clipped.   She was sent out on Eliquis.  She had continued bleeding and was readmitted.  Eliquis was stopped and she had a repeat endoscopy.   She has been seen since her last office visit here she has been seen by GI.  I reviewed these records for this visit.    There remarks are listed below.    She has not had any tachypalpitations.  She says she typically knows when she is in atrial fibrillation.  She has not had any presyncope or syncope.  Her profound fatigue is a little bit better as her hemoglobin has gone up.  She has not had any recurrent bleeding now just taking aspirin.  She denies any chest pressure, neck or arm discomfort.  She is had no shortness of breath, PND or orthopnea.  She denies weight gain or edema.   Allergies  Allergen Reactions  . Quinapril Hcl Cough  . Statins Other (See Comments)    Not all Statins but some cause cough and pain in legs.  . Tape Other (See Comments)    Redness, please use "paper" tape    Current Outpatient Medications  Medication Sig Dispense Refill  . amoxicillin-clavulanate (AUGMENTIN) 875-125 MG tablet Take 1 tablet by mouth 2 (two) times daily for 7 days. 14 tablet 0  . aspirin EC 81 MG tablet Take 1 tablet (81 mg total) by mouth daily. 90 tablet 3  . chlorthalidone (HYGROTON) 25 MG tablet Take 25 mg by mouth daily.  2  . CRANBERRY PO Take 1 tablet by mouth 2 (two) times daily.    Marland Kitchen diltiazem (CARDIZEM) 30 MG tablet TAKE 1 TABLET BY MOUTH EVERY DAY 90 tablet 1  . ezetimibe (ZETIA) 10 MG tablet Take 10 mg by mouth daily.    . flecainide (TAMBOCOR) 150 MG tablet TAKE 0.5 TABLETS (75 MG TOTAL) BY MOUTH 2 (TWO) TIMES DAILY. 90 tablet 1  . furosemide (LASIX) 20 MG tablet Take 1 tablet (20  mg total) by mouth daily as needed for fluid. 90 tablet 0  . glipiZIDE (GLUCOTROL) 5 MG tablet Take 1 tablet (5 mg total) by mouth 2 (two) times daily. 180 tablet 0  . levothyroxine (SYNTHROID, LEVOTHROID) 112 MCG tablet TAKE 1 TABLET BY MOUTH EVERY OTHER DAY, ALTERNATING WITH 1 & 1/2 TABLETS EVERY OTHER DAY 112 tablet 3  . losartan (COZAAR) 25 MG tablet Take 1 tablet (25 mg total) by mouth daily. 90 tablet 3  . lovastatin (MEVACOR) 40 MG tablet Take 1 tablet (40 mg total) by mouth every evening. 90 tablet 3  . MATZIM LA 180 MG 24 hr tablet TAKE ONE TABLET BY MOUTH ONE TIME DAILY 60 tablet 11  . metFORMIN (GLUCOPHAGE) 1000 MG tablet Take 1 tablet (1,000 mg total) by mouth 2 (two) times daily with a meal. 180 tablet 3  . metoprolol succinate (TOPROL-XL) 100 MG 24 hr tablet TAKE 1 TABLET BY MOUTH EVERY DAY 90 tablet 1  . oxyCODONE-acetaminophen (PERCOCET/ROXICET) 5-325 MG tablet Take 0.5-1 tablets by mouth every 8 (eight) hours as needed for severe pain. 30 tablet 0  . pantoprazole (PROTONIX) 40 MG tablet Take 1 tablet by mouth daily.    Marland Kitchen PRESCRIPTION MEDICATION Apply 1  application topically daily.    Marland Kitchen rOPINIRole (REQUIP) 0.5 MG tablet Take 1 tablet (0.5 mg total) by mouth at bedtime. 90 tablet 1   Current Facility-Administered Medications  Medication Dose Route Frequency Provider Last Rate Last Dose  . cyanocobalamin ((VITAMIN B-12)) injection 1,000 mcg  1,000 mcg Intramuscular Q30 days Timmothy Euler, MD   1,000 mcg at 04/19/18 1012    Past Medical History:  Diagnosis Date  . Anemia   . Arthritis   . Atrial fibrillation, rapid ([place]) 10/27/2013  . Bilateral carotid artery disease ([place])   . Diabetes mellitus without complication ([place])   . Difficult intubation    states 'lady that did the sleep study told me I have the smallest airway she has ever seen in an adult"  . Dyslipidemia 06/29/2017  . Dysrhythmia   . Heart murmur   . Hypertension   . Hypothyroid   . Obesity   . Obstructive  sleep apnea    on C Pap  . PAF (paroxysmal atrial fibrillation) ([place])    a. newly dx in 09/2013; on eliquis  . UTI (urinary tract infection)     Past Surgical History:  Procedure Laterality Date  . CATARACT EXTRACTION Left   . CATARACT EXTRACTION W/PHACO Right 12/09/2015   Procedure: CATARACT EXTRACTION PHACO AND INTRAOCULAR LENS PLACEMENT (IOC);  Surgeon: Tonny Branch, MD;  Location: [facility];  Service: Ophthalmology;  Laterality: Right;  CDE:10.48  . COLONOSCOPY W/ POLYPECTOMY    . cyst on back of neck removed    . DILATION AND CURETTAGE OF UTERUS     x 5  . ENDARTERECTOMY Left 08/30/2015   Procedure: LEFT CAROYID ENDARTERECTOMY WITH XENOSURE BOVINE PERICARDIUM PATCH ANGIOPLASTY;  Surgeon: [name], MD;  Location: [employer];  Service: Vascular;  Laterality: Left;  . EYE SURGERY    . TONSILLECTOMY    . UPPER GI ENDOSCOPY      ROS:  As stated in the HPI and negative for all other systems.  PHYSICAL EXAM BP (!) 152/60   Pulse (!) 57   Ht 5\' 2"  (1.575 m)   Wt 237 lb 6.4 oz (107.7 kg)   BMI 43.42 kg/m   GENERAL:  Well appearing NECK:  No jugular venous distention, waveform within normal limits, carotid upstroke brisk and symmetric, no bruits, no thyromegaly LUNGS:  Clear to auscultation bilaterally CHEST:  Unremarkable HEART:  PMI not displaced or sustained,S1 and S2 within normal limits, no S3, no S4, no clicks, no rubs, no murmurs ABD:  Flat, positive bowel sounds normal in frequency in pitch, no bruits, no rebound, no guarding, no midline pulsatile mass, no hepatomegaly, no splenomegaly EXT:  2 plus pulses throughout, no edema, no cyanosis no clubbing   EKG:   NA   ASSESSMENT AND PLAN  ATRIAL FIB:   .  Ms. Jakaila Dilworth has a CHA2DS2 - VASc score of 4 with a risk of stroke of 4%.   I did have a conversation with our structural heart team.  She clearly would be a candidate for Watchman.  However, we have to decide where we will do this here versus [facility].  For now I  am going to restart Eliquis although she would had to be on this for a period of time surrounding her procedure.  GI BLEEDING:  It was suggested that she would likely have recurrent bleeding from gastric polyps and possibly AVMs.   This is addressed as above.  HTN: Blood pressure is elevated today but  is been running low recently so I will not change her medications.  She will keep an eye on this at home.    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   She has an appointment in March.  OBESITY:   She understands the need for diet and weight loss.  DM: A1c is 8.1.  This is followed by her primary provider.   [facility] records reviewed, GI records reviewed.

## 2018-04-19 NOTE — Patient Instructions (Addendum)
For the abscess, I have prescribed Augmentin. Take twice daily for 1 week.  Continue warm compresses.  Start with using products that promote saliva in the mouth.  Try Biotene.  Halitosis Halitosis is bad breath. Halitosis may be caused by:  Foods and drinks that you ingest.  Poor oral hygiene.  Medical conditions, such as sinus infections, mouth infections, and diabetes.  Medicines that dry out your mouth.  Smoking.  Follow these instructions at home:  Practice good oral hygiene. Do this by: ? Flossing every day. Ask your dentist to show you the best way to floss. ? Brushing your teeth at least two times each day using toothpaste that is recommended by your dentist. Ask your dentist to show you the best way to brush your teeth. ? Brushing your tongue when you brush your teeth. This may help to improve your breath. ? Rinsing your mouth one time each day using mouthwash that is recommended by your dentist. ? Scheduling and attending regular dental appointments.  Drink enough water to keep your urine clear or pale yellow.  Eat foods that help to keep your teeth clean, such as carrots and celery.  Avoid foods and drinks that can lead to bad breath, such as: ? Garlic. ? Onions. ? Fish. ? Coffee. ? Alcohol. ? Horseradish. ? Red meat.  Do not use any tobacco products, including cigarettes, chewing tobacco, or electronic cigarettes. If you need help quitting, ask your health care provider.  Make sure that any mouth devices that you wear, such as a retainer or dentures, are worn and cleaned properly.  If you have a dry mouth, try chewing gum or mints that do not contain sugar. Contact a health care provider if:  You have new symptoms.  Your symptoms get worse or they do not improve with home care. This information is not intended to replace advice given to you by your health care provider. Make sure you discuss any questions you have with your health care provider. Document  Released: 06/25/2004 Document Revised: 10/24/2015 Document Reviewed: 05/14/2014 Elsevier Interactive Patient Education  [employer].

## 2018-04-20 ENCOUNTER — Encounter: Payer: Self-pay | Admitting: Cardiology

## 2018-04-20 LAB — CBC
HEMATOCRIT: 32.2 % — AB (ref 34.0–46.6)
Hemoglobin: 10.4 g/dL — ABNORMAL LOW (ref 11.1–15.9)
MCH: 29.3 pg (ref 26.6–33.0)
MCHC: 32.3 g/dL (ref 31.5–35.7)
MCV: 91 fL (ref 79–97)
Platelets: 245 10*3/uL (ref 150–450)
RBC: 3.55 x10E6/uL — ABNORMAL LOW (ref 3.77–5.28)
RDW: 13.9 % (ref 12.3–15.4)
WBC: 7.8 10*3/uL (ref 3.4–10.8)

## 2018-04-25 ENCOUNTER — Other Ambulatory Visit: Payer: Self-pay | Admitting: *Deleted

## 2018-04-25 MED ORDER — METOPROLOL SUCCINATE ER 100 MG PO TB24: 100.0000 mg | ORAL_TABLET | Freq: Every day | ORAL | 1 refills | Status: DC

## 2018-04-27 ENCOUNTER — Other Ambulatory Visit: Payer: Self-pay | Admitting: Cardiovascular Disease

## 2018-05-10 DIAGNOSIS — Z01419 Encounter for gynecological examination (general) (routine) without abnormal findings: Secondary | ICD-10-CM | POA: Diagnosis not present

## 2018-05-10 DIAGNOSIS — Z1231 Encounter for screening mammogram for malignant neoplasm of breast: Secondary | ICD-10-CM | POA: Diagnosis not present

## 2018-05-10 DIAGNOSIS — Z6841 Body Mass Index (BMI) 40.0 and over, adult: Secondary | ICD-10-CM | POA: Diagnosis not present

## 2018-05-10 DIAGNOSIS — Z30433 Encounter for removal and reinsertion of intrauterine contraceptive device: Secondary | ICD-10-CM | POA: Diagnosis not present

## 2018-05-13 DIAGNOSIS — N8501 Benign endometrial hyperplasia: Secondary | ICD-10-CM | POA: Diagnosis not present

## 2018-05-13 DIAGNOSIS — Z01419 Encounter for gynecological examination (general) (routine) without abnormal findings: Secondary | ICD-10-CM | POA: Diagnosis not present

## 2018-05-13 DIAGNOSIS — Z1159 Encounter for screening for other viral diseases: Secondary | ICD-10-CM | POA: Diagnosis not present

## 2018-05-18 LAB — HM PAP SMEAR: HPV 16/18/45 genotyping: NEGATIVE

## 2018-05-23 ENCOUNTER — Ambulatory Visit (INDEPENDENT_AMBULATORY_CARE_PROVIDER_SITE_OTHER): Payer: Medicare Other | Admitting: *Deleted

## 2018-05-23 DIAGNOSIS — E538 Deficiency of other specified B group vitamins: Secondary | ICD-10-CM

## 2018-05-23 NOTE — Progress Notes (Signed)
Pt given Cyanocobalamin inj Tolerated well 

## 2018-06-07 ENCOUNTER — Ambulatory Visit: Payer: Medicare Other | Admitting: Family Medicine

## 2018-06-14 ENCOUNTER — Other Ambulatory Visit: Payer: Self-pay | Admitting: Cardiology

## 2018-06-15 DIAGNOSIS — I4891 Unspecified atrial fibrillation: Secondary | ICD-10-CM | POA: Diagnosis not present

## 2018-06-15 DIAGNOSIS — I517 Cardiomegaly: Secondary | ICD-10-CM | POA: Diagnosis not present

## 2018-06-15 DIAGNOSIS — R9431 Abnormal electrocardiogram [ECG] [EKG]: Secondary | ICD-10-CM | POA: Diagnosis not present

## 2018-06-15 DIAGNOSIS — I480 Paroxysmal atrial fibrillation: Secondary | ICD-10-CM | POA: Diagnosis not present

## 2018-06-15 DIAGNOSIS — R001 Bradycardia, unspecified: Secondary | ICD-10-CM | POA: Diagnosis not present

## 2018-06-20 ENCOUNTER — Telehealth: Payer: Self-pay | Admitting: Family Medicine

## 2018-06-20 NOTE — Telephone Encounter (Signed)
Pt aware.

## 2018-06-21 ENCOUNTER — Encounter: Payer: Self-pay | Admitting: Family Medicine

## 2018-06-21 ENCOUNTER — Ambulatory Visit (INDEPENDENT_AMBULATORY_CARE_PROVIDER_SITE_OTHER): Payer: Medicare Other | Admitting: Family Medicine

## 2018-06-21 VITALS — BP 129/55 | HR 60 | Temp 97.8°F | Ht 62.0 in | Wt 237.0 lb

## 2018-06-21 DIAGNOSIS — E1169 Type 2 diabetes mellitus with other specified complication: Secondary | ICD-10-CM

## 2018-06-21 DIAGNOSIS — E1165 Type 2 diabetes mellitus with hyperglycemia: Secondary | ICD-10-CM

## 2018-06-21 DIAGNOSIS — I10 Essential (primary) hypertension: Secondary | ICD-10-CM | POA: Diagnosis not present

## 2018-06-21 DIAGNOSIS — E1159 Type 2 diabetes mellitus with other circulatory complications: Secondary | ICD-10-CM | POA: Diagnosis not present

## 2018-06-21 DIAGNOSIS — E785 Hyperlipidemia, unspecified: Secondary | ICD-10-CM

## 2018-06-21 DIAGNOSIS — E119 Type 2 diabetes mellitus without complications: Secondary | ICD-10-CM | POA: Diagnosis not present

## 2018-06-21 DIAGNOSIS — E538 Deficiency of other specified B group vitamins: Secondary | ICD-10-CM

## 2018-06-21 LAB — BAYER DCA HB A1C WAIVED: HB A1C (BAYER DCA - WAIVED): 9.3 % — ABNORMAL HIGH (ref <7.0)

## 2018-06-21 NOTE — Patient Instructions (Addendum)
We discussed that your diabetes is not controlled.  We discussed adding Januvia today to your regimen but you declined this and instead have asked to modify diet and exercise.  We discussed the risks of poor healing after surgery with high blood sugar.  You should monitor your blood sugar daily and I would like to see you following your surgery.  You had labs performed today.  You will be contacted with the results of the labs once they are available, usually in the next 3 business days for routine lab work.  If you had a pap smear or biopsy performed, expect to be contacted in about 7-10 days.  Diabetes Mellitus and Exercise Exercising regularly is important for your overall health, especially when you have diabetes (diabetes mellitus). Exercising is not only about losing weight. It has many other health benefits, such as increasing muscle strength and bone density and reducing body fat and stress. This leads to improved fitness, flexibility, and endurance, all of which result in better overall health. Exercise has additional benefits for people with diabetes, including:  Reducing appetite.  Helping to lower and control blood glucose.  Lowering blood pressure.  Helping to control amounts of fatty substances (lipids) in the blood, such as cholesterol and triglycerides.  Helping the body to respond better to insulin (improving insulin sensitivity).  Reducing how much insulin the body needs.  Decreasing the risk for heart disease by: ? Lowering cholesterol and triglyceride levels. ? Increasing the levels of good cholesterol. ? Lowering blood glucose levels. What is my activity plan? Your health care provider or certified diabetes educator can help you make a plan for the type and frequency of exercise (activity plan) that works for you. Make sure that you:  Do at least 150 minutes of moderate-intensity or vigorous-intensity exercise each week. This could be brisk walking, biking, or water  aerobics. ? Do stretching and strength exercises, such as yoga or weightlifting, at least 2 times a week. ? Spread out your activity over at least 3 days of the week.  Get some form of physical activity every day. ? Do not go more than 2 days in a row without some kind of physical activity. ? Avoid being inactive for more than 30 minutes at a time. Take frequent breaks to walk or stretch.  Choose a type of exercise or activity that you enjoy, and set realistic goals.  Start slowly, and gradually increase the intensity of your exercise over time. What do I need to know about managing my diabetes?   Check your blood glucose before and after exercising. ? If your blood glucose is 240 mg/dL (13.3 mmol/L) or higher before you exercise, check your urine for ketones. If you have ketones in your urine, do not exercise until your blood glucose returns to normal. ? If your blood glucose is 100 mg/dL (5.6 mmol/L) or lower, eat a snack containing 15-20 grams of carbohydrate. Check your blood glucose 15 minutes after the snack to make sure that your level is above 100 mg/dL (5.6 mmol/L) before you start your exercise.  Know the symptoms of low blood glucose (hypoglycemia) and how to treat it. Your risk for hypoglycemia increases during and after exercise. Common symptoms of hypoglycemia can include: ? Hunger. ? Anxiety. ? Sweating and feeling clammy. ? Confusion. ? Dizziness or feeling light-headed. ? Increased heart rate or palpitations. ? Blurry vision. ? Tingling or numbness around the mouth, lips, or tongue. ? Tremors or shakes. ? Irritability.  Keep a  rapid-acting carbohydrate snack available before, during, and after exercise to help prevent or treat hypoglycemia.  Avoid injecting insulin into areas of the body that are going to be exercised. For example, avoid injecting insulin into: ? The arms, when playing tennis. ? The legs, when jogging.  Keep records of your exercise habits. Doing  this can help you and your health care provider adjust your diabetes management plan as needed. Write down: ? Food that you eat before and after you exercise. ? Blood glucose levels before and after you exercise. ? The type and amount of exercise you have done. ? When your insulin is expected to peak, if you use insulin. Avoid exercising at times when your insulin is peaking.  When you start a new exercise or activity, work with your health care provider to make sure the activity is safe for you, and to adjust your insulin, medicines, or food intake as needed.  Drink plenty of water while you exercise to prevent dehydration or heat stroke. Drink enough fluid to keep your urine clear or pale yellow. Summary  Exercising regularly is important for your overall health, especially when you have diabetes (diabetes mellitus).  Exercising has many health benefits, such as increasing muscle strength and bone density and reducing body fat and stress.  Your health care provider or certified diabetes educator can help you make a plan for the type and frequency of exercise (activity plan) that works for you.  When you start a new exercise or activity, work with your health care provider to make sure the activity is safe for you, and to adjust your insulin, medicines, or food intake as needed. This information is not intended to replace advice given to you by your health care provider. Make sure you discuss any questions you have with your health care provider. Document Released: 08/08/2003 Document Revised: 11/26/2016 Document Reviewed: 10/28/2015 Elsevier Interactive Patient Education  2019 [employer].

## 2018-06-21 NOTE — Progress Notes (Signed)
Subjective: CC: anemia, DM2, HTN, HLD PCP: Janora Norlander, DO LKG:MWNUU Granillo is a 68 y.o. female presenting to clinic today for:  1. Anemia/ Afib Patient with medical history significant for vitamin B12 deficiency.  She had a GI bleed at the end of last year and required hospitalization with subsequent transfusion.  GI bleed was thought to be related to use of anticoagulation for atrial fibrillation and now there are plans for the watchman procedure in February.  She notes that she plans on starting back on her Eliquis 2 weeks prior to procedure and continue at 45 days following procedure.  She is somewhat nervous about the procedure but hopes that her outcome will be good.  Denies any lightheadedness, loss of consciousness, pallor, heart palpitation, chest pain or shortness of breath.  2. Type 2 Diabetes w/ hypertension and hyperlipidemia:  Patient reports that she has not been "very good over the holidays".  Blood sugars have been elevated when she checks them.  She is taking her diabetic medications but sometimes forgets her doses.  She is prescribed Metformin 1000mg  BID, Glipizide 5 BID, Cozaar 25mg , Metoprolol XL 100mg , (also on Diltiazem and flecainide for afib and Lasix prn fluid), Lovastatin 40, Zetia. Side effects: None.  Last eye exam: 06/29/2017 Last foot exam: due Last A1c: 10/2017 and was 8.1 Nephropathy screen indicated?: on ACE-I Last flu, zoster and/or pneumovax: UTD  ROS: Denies dizziness, LOC, polyuria, polydipsia, unintended weight loss/gain, foot ulcerations, shortness of breath or chest pain.   ROS: Per HPI  Allergies  Allergen Reactions  . Quinapril Hcl Cough  . Statins Other (See Comments)    Not all Statins but some cause cough and pain in legs.  . Tape Other (See Comments)    Redness, please use "paper" tape   Past Medical History:  Diagnosis Date  . Anemia   . Arthritis   . Atrial fibrillation, rapid ([place]) 10/27/2013  . Bilateral carotid artery  disease ([place])   . Diabetes mellitus without complication ([place])   . Difficult intubation    states 'lady that did the sleep study told me I have the smallest airway she has ever seen in an adult"  . Dyslipidemia 06/29/2017  . Dysrhythmia   . Heart murmur   . Hypertension   . Hypothyroid   . Obesity   . Obstructive sleep apnea    on C Pap  . PAF (paroxysmal atrial fibrillation) ([place])    a. newly dx in 09/2013; on eliquis  . UTI (urinary tract infection)     Current Outpatient Medications:  .  aspirin EC 81 MG tablet, Take 1 tablet (81 mg total) by mouth daily., Disp: 90 tablet, Rfl: 3 .  chlorthalidone (HYGROTON) 25 MG tablet, TAKE 1 TABLET BY MOUTH EVERY DAY, Disp: 90 tablet, Rfl: 2 .  CRANBERRY PO, Take 1 tablet by mouth 2 (two) times daily., Disp: , Rfl:  .  diltiazem (CARDIZEM) 30 MG tablet, TAKE 1 TABLET BY MOUTH EVERY DAY, Disp: 90 tablet, Rfl: 1 .  ezetimibe (ZETIA) 10 MG tablet, Take 10 mg by mouth daily., Disp: , Rfl:  .  flecainide (TAMBOCOR) 150 MG tablet, Take 0.5 tablets (75 mg total) by mouth 2 (two) times daily., Disp: 90 tablet, Rfl: 3 .  furosemide (LASIX) 20 MG tablet, Take 1 tablet (20 mg total) by mouth daily as needed for fluid., Disp: 90 tablet, Rfl: 0 .  glipiZIDE (GLUCOTROL) 5 MG tablet, Take 1 tablet (5 mg total) by mouth 2 (two)  times daily., Disp: 180 tablet, Rfl: 0 .  levothyroxine (SYNTHROID, LEVOTHROID) 112 MCG tablet, TAKE 1 TABLET BY MOUTH EVERY OTHER DAY, ALTERNATING WITH 1 & 1/2 TABLETS EVERY OTHER DAY, Disp: 112 tablet, Rfl: 3 .  losartan (COZAAR) 25 MG tablet, Take 1 tablet (25 mg total) by mouth daily., Disp: 90 tablet, Rfl: 3 .  lovastatin (MEVACOR) 40 MG tablet, Take 1 tablet (40 mg total) by mouth every evening., Disp: 90 tablet, Rfl: 3 .  MATZIM LA 180 MG 24 hr tablet, TAKE ONE TABLET BY MOUTH ONE TIME DAILY, Disp: 60 tablet, Rfl: 11 .  metFORMIN (GLUCOPHAGE) 1000 MG tablet, Take 1 tablet (1,000 mg total) by mouth 2 (two) times daily with a meal.,  Disp: 180 tablet, Rfl: 3 .  metoprolol succinate (TOPROL-XL) 100 MG 24 hr tablet, Take 1 tablet (100 mg total) by mouth daily. Take with or immediately following a meal., Disp: 90 tablet, Rfl: 1 .  oxyCODONE-acetaminophen (PERCOCET/ROXICET) 5-325 MG tablet, Take 0.5-1 tablets by mouth every 8 (eight) hours as needed for severe pain., Disp: 30 tablet, Rfl: 0 .  pantoprazole (PROTONIX) 40 MG tablet, Take 1 tablet by mouth daily., Disp: , Rfl:  .  PRESCRIPTION MEDICATION, Apply 1 application topically daily., Disp: , Rfl:  .  rOPINIRole (REQUIP) 0.5 MG tablet, Take 1 tablet (0.5 mg total) by mouth at bedtime., Disp: 90 tablet, Rfl: 1  Current Facility-Administered Medications:  .  cyanocobalamin ((VITAMIN B-12)) injection 1,000 mcg, 1,000 mcg, Intramuscular, Q30 days, Timmothy Euler, MD, 1,000 mcg at 05/23/18 7510 Social History   Socioeconomic History  . Marital status: Single    Spouse name: Not on file  . Number of children: Not on file  . Years of education: Not on file  . Highest education level: Not on file  Occupational History  . Not on file  Social Needs  . Financial resource strain: Not on file  . Food insecurity:    Worry: Not on file    Inability: Not on file  . Transportation needs:    Medical: No    Non-medical: No  Tobacco Use  . Smoking status: Never Smoker  . Smokeless tobacco: Never Used  Substance and Sexual Activity  . Alcohol use: No    Alcohol/week: 0.0 standard drinks  . Drug use: Never  . Sexual activity: Not on file  Lifestyle  . Physical activity:    Days per week: 0 days    Minutes per session: 0 min  . Stress: Not at all  Relationships  . Social connections:    Talks on phone: Twice a week    Gets together: Twice a week    Attends religious service: More than 4 times per year    Active member of club or organization: No    Attends meetings of clubs or organizations: Never    Relationship status: Never married  . Intimate partner violence:      Fear of current or ex partner: No    Emotionally abused: No    Physically abused: No    Forced sexual activity: No  Other Topics Concern  . Not on file  Social History Narrative  . Not on file   Family History  Problem Relation Age of Onset  . COPD Father   . Heart failure Father   . Heart disease Father   . Emphysema Father   . Arrhythmia Sister   . Arrhythmia Sister   . Arrhythmia Sister  had PPM also  . Cancer Sister     Objective: Office vital signs reviewed. BP (!) 129/55   Pulse 60   Temp 97.8 F (36.6 C) (Oral)   Ht 5\' 2"  (1.575 m)   Wt 237 lb (107.5 kg)   BMI 43.35 kg/m   Physical Examination:  General: Awake, alert, well nourished, No acute distress HEENT: Normal    Eyes: PERRLA, extraocular membranes intact, sclera white. No conjunctival pallor.    Throat: moist mucus membranes, no erythema, Mallampati 3. Cardio: bradycardic w/ regular rhythm, S1S2 heard, 2/6 SEM ppreciated Pulm: clear to auscultation bilaterally, no wheezes, rhonchi or rales; normal work of breathing on room air Extremities: warm, well perfused, No edema, cyanosis or clubbing; +2 pulses bilaterally Neuro:  DM foot exam below  Diabetic Foot Exam - Simple   Simple Foot Form Diabetic Foot exam was performed with the following findings:  Yes 06/21/2018 11:05 AM  Visual Inspection No deformities, no ulcerations, no other skin breakdown bilaterally:  Yes Sensation Testing Intact to touch and monofilament testing bilaterally:  Yes Pulse Check Posterior Tibialis and Dorsalis pulse intact bilaterally:  Yes Comments Vibratory sense intact bilaterally.    Lab Results  Component Value Date   HGBA1C 8.1 (H) 11/02/2017   Assessment/ Plan: 68 y.o. female   1. Uncontrolled type 2 diabetes mellitus with hyperglycemia (HCC) Her A1c was 9.2 today.  This is a significant rise from her A1c in June.  She was lost to follow-up in the fall because of hospitalizations for GI bleed and  anemia.  She has had some noncompliance issues as well as lapses in her diet.  We discussed that addition of medication should strongly be considered.  I reviewed adding Januvia with her today.  She was very against adding new medication and wishes to pursue lifestyle modification.  We did discuss the risks of uncontrolled diabetes, particular postsurgical risks and poor healing.  She voiced good understanding and plans to go carb free and increase physical exercise.  We discussed monitoring blood sugars daily with goal fasting blood sugar less than 150.  I would like to see her back postop, sooner if blood sugars are remaining substantially elevated.  Diabetic foot exam was performed today. - Bayer DCA Hb A1c Waived  2. Hyperlipidemia associated with type 2 diabetes mellitus ([place]) Continue statin  3. Hypertension associated with diabetes ([place]) Under excellent control - Basic Metabolic Panel  4. Vitamin B12 deficiency Check CBC and B12 given history of blood loss anemia and vitamin B12 deficiency - CBC - Vitamin B12   Orders Placed This Encounter  Procedures  . CBC  . Bayer DCA Hb A1c Waived  . Vitamin B12  . Basic Metabolic Panel   No orders of the defined types were placed in this encounter.    Janora Norlander, DO [place] [phone]

## 2018-06-22 LAB — BASIC METABOLIC PANEL
BUN/Creatinine Ratio: 34 — ABNORMAL HIGH (ref 12–28)
BUN: 27 mg/dL (ref 8–27)
CO2: 25 mmol/L (ref 20–29)
Calcium: 9.4 mg/dL (ref 8.7–10.3)
Chloride: 94 mmol/L — ABNORMAL LOW (ref 96–106)
Creatinine, Ser: 0.79 mg/dL (ref 0.57–1.00)
GFR calc Af Amer: 89 mL/min/{1.73_m2} (ref >59)
GFR calc non Af Amer: 77 mL/min/{1.73_m2} (ref >59)
Glucose: 377 mg/dL — ABNORMAL HIGH (ref 65–99)
Potassium: 4 mmol/L (ref 3.5–5.2)
Sodium: 135 mmol/L (ref 134–144)

## 2018-06-22 LAB — CBC
Hematocrit: 35.8 % (ref 34.0–46.6)
Hemoglobin: 11.4 g/dL (ref 11.1–15.9)
MCH: 28.9 pg (ref 26.6–33.0)
MCHC: 31.8 g/dL (ref 31.5–35.7)
MCV: 91 fL (ref 79–97)
Platelets: 227 10*3/uL (ref 150–450)
RBC: 3.95 x10E6/uL (ref 3.77–5.28)
RDW: 14.4 % (ref 11.7–15.4)
WBC: 7.3 10*3/uL (ref 3.4–10.8)

## 2018-06-22 LAB — VITAMIN B12: Vitamin B-12: 508 pg/mL (ref 232–1245)

## 2018-06-28 ENCOUNTER — Other Ambulatory Visit: Payer: Self-pay | Admitting: Family Medicine

## 2018-06-28 ENCOUNTER — Telehealth: Payer: Self-pay | Admitting: Family Medicine

## 2018-06-28 DIAGNOSIS — E119 Type 2 diabetes mellitus without complications: Secondary | ICD-10-CM

## 2018-06-28 MED ORDER — BLOOD GLUCOSE METER KIT: PACK | 0 refills | Status: DC

## 2018-06-28 MED ORDER — GLUCOSE BLOOD VI STRP: ORAL_STRIP | 12 refills | Status: DC

## 2018-06-28 MED ORDER — LANCET DEVICE MISC: 12 refills | Status: DC

## 2018-06-28 NOTE — Telephone Encounter (Signed)
Emily [name] has a very old glucometer and her blood sugars have been running in the 300s. She double checked her blood sugars on a friend's glucometer and the readings were 250s to 260s. She would like to get a prescription for a new glucometer. Thank you!

## 2018-06-28 NOTE — Telephone Encounter (Signed)
Will fax today

## 2018-07-05 DIAGNOSIS — I358 Other nonrheumatic aortic valve disorders: Secondary | ICD-10-CM | POA: Diagnosis not present

## 2018-07-05 DIAGNOSIS — I340 Nonrheumatic mitral (valve) insufficiency: Secondary | ICD-10-CM | POA: Diagnosis not present

## 2018-07-05 DIAGNOSIS — I4891 Unspecified atrial fibrillation: Secondary | ICD-10-CM | POA: Diagnosis not present

## 2018-07-05 DIAGNOSIS — I350 Nonrheumatic aortic (valve) stenosis: Secondary | ICD-10-CM | POA: Diagnosis not present

## 2018-07-05 DIAGNOSIS — I700 Atherosclerosis of aorta: Secondary | ICD-10-CM | POA: Diagnosis not present

## 2018-07-07 ENCOUNTER — Other Ambulatory Visit: Payer: Self-pay | Admitting: Cardiology

## 2018-07-15 DIAGNOSIS — D649 Anemia, unspecified: Secondary | ICD-10-CM | POA: Diagnosis present

## 2018-07-15 DIAGNOSIS — E1169 Type 2 diabetes mellitus with other specified complication: Secondary | ICD-10-CM | POA: Diagnosis present

## 2018-07-15 DIAGNOSIS — I4891 Unspecified atrial fibrillation: Secondary | ICD-10-CM | POA: Diagnosis not present

## 2018-07-15 DIAGNOSIS — I083 Combined rheumatic disorders of mitral, aortic and tricuspid valves: Secondary | ICD-10-CM | POA: Diagnosis not present

## 2018-07-15 DIAGNOSIS — E1151 Type 2 diabetes mellitus with diabetic peripheral angiopathy without gangrene: Secondary | ICD-10-CM | POA: Diagnosis present

## 2018-07-15 DIAGNOSIS — Z7901 Long term (current) use of anticoagulants: Secondary | ICD-10-CM | POA: Diagnosis not present

## 2018-07-15 DIAGNOSIS — E039 Hypothyroidism, unspecified: Secondary | ICD-10-CM | POA: Diagnosis present

## 2018-07-15 DIAGNOSIS — J9811 Atelectasis: Secondary | ICD-10-CM | POA: Diagnosis not present

## 2018-07-15 DIAGNOSIS — Z006 Encounter for examination for normal comparison and control in clinical research program: Secondary | ICD-10-CM | POA: Diagnosis not present

## 2018-07-15 DIAGNOSIS — Z7982 Long term (current) use of aspirin: Secondary | ICD-10-CM | POA: Diagnosis not present

## 2018-07-15 DIAGNOSIS — I480 Paroxysmal atrial fibrillation: Secondary | ICD-10-CM | POA: Diagnosis present

## 2018-07-15 DIAGNOSIS — K219 Gastro-esophageal reflux disease without esophagitis: Secondary | ICD-10-CM | POA: Diagnosis present

## 2018-07-15 DIAGNOSIS — R9431 Abnormal electrocardiogram [ECG] [EKG]: Secondary | ICD-10-CM | POA: Diagnosis not present

## 2018-07-15 DIAGNOSIS — I152 Hypertension secondary to endocrine disorders: Secondary | ICD-10-CM | POA: Diagnosis present

## 2018-07-15 DIAGNOSIS — E785 Hyperlipidemia, unspecified: Secondary | ICD-10-CM | POA: Diagnosis present

## 2018-07-15 DIAGNOSIS — I444 Left anterior fascicular block: Secondary | ICD-10-CM | POA: Diagnosis not present

## 2018-07-15 DIAGNOSIS — Z95818 Presence of other cardiac implants and grafts: Secondary | ICD-10-CM | POA: Diagnosis not present

## 2018-07-15 DIAGNOSIS — G4733 Obstructive sleep apnea (adult) (pediatric): Secondary | ICD-10-CM | POA: Diagnosis present

## 2018-07-15 DIAGNOSIS — I517 Cardiomegaly: Secondary | ICD-10-CM | POA: Diagnosis not present

## 2018-07-15 DIAGNOSIS — L4050 Arthropathic psoriasis, unspecified: Secondary | ICD-10-CM | POA: Diagnosis present

## 2018-07-16 DIAGNOSIS — Z95818 Presence of other cardiac implants and grafts: Secondary | ICD-10-CM | POA: Insufficient documentation

## 2018-07-18 ENCOUNTER — Telehealth: Payer: Self-pay | Admitting: Family Medicine

## 2018-07-18 ENCOUNTER — Ambulatory Visit: Payer: Medicare Other | Admitting: Family Medicine

## 2018-07-18 NOTE — Telephone Encounter (Signed)
Patient aware she may try ducolax or miralax

## 2018-07-20 ENCOUNTER — Ambulatory Visit (INDEPENDENT_AMBULATORY_CARE_PROVIDER_SITE_OTHER): Payer: Medicare Other | Admitting: *Deleted

## 2018-07-20 ENCOUNTER — Ambulatory Visit: Payer: Medicare Other | Admitting: Family Medicine

## 2018-07-20 DIAGNOSIS — E538 Deficiency of other specified B group vitamins: Secondary | ICD-10-CM

## 2018-07-20 NOTE — Progress Notes (Signed)
Pt given Cyanocobalamin inj Tolerated well 

## 2018-07-24 DIAGNOSIS — I454 Nonspecific intraventricular block: Secondary | ICD-10-CM | POA: Diagnosis not present

## 2018-07-24 DIAGNOSIS — R0902 Hypoxemia: Secondary | ICD-10-CM | POA: Diagnosis not present

## 2018-07-24 DIAGNOSIS — R079 Chest pain, unspecified: Secondary | ICD-10-CM | POA: Diagnosis not present

## 2018-07-24 DIAGNOSIS — R9431 Abnormal electrocardiogram [ECG] [EKG]: Secondary | ICD-10-CM | POA: Diagnosis not present

## 2018-07-24 DIAGNOSIS — J90 Pleural effusion, not elsewhere classified: Secondary | ICD-10-CM | POA: Diagnosis not present

## 2018-07-24 DIAGNOSIS — R0689 Other abnormalities of breathing: Secondary | ICD-10-CM | POA: Diagnosis not present

## 2018-07-24 DIAGNOSIS — R000 Tachycardia, unspecified: Secondary | ICD-10-CM | POA: Diagnosis not present

## 2018-07-24 DIAGNOSIS — I4891 Unspecified atrial fibrillation: Secondary | ICD-10-CM | POA: Diagnosis not present

## 2018-07-25 DIAGNOSIS — I4891 Unspecified atrial fibrillation: Secondary | ICD-10-CM | POA: Diagnosis not present

## 2018-07-25 DIAGNOSIS — R9431 Abnormal electrocardiogram [ECG] [EKG]: Secondary | ICD-10-CM | POA: Diagnosis not present

## 2018-07-25 DIAGNOSIS — I454 Nonspecific intraventricular block: Secondary | ICD-10-CM | POA: Diagnosis not present

## 2018-07-26 ENCOUNTER — Other Ambulatory Visit: Payer: Self-pay | Admitting: Cardiology

## 2018-08-02 ENCOUNTER — Ambulatory Visit (INDEPENDENT_AMBULATORY_CARE_PROVIDER_SITE_OTHER): Payer: Medicare Other | Admitting: Family Medicine

## 2018-08-02 VITALS — BP 129/56 | HR 52 | Temp 97.3°F | Ht 62.0 in | Wt 235.0 lb

## 2018-08-02 DIAGNOSIS — Z7901 Long term (current) use of anticoagulants: Secondary | ICD-10-CM | POA: Diagnosis not present

## 2018-08-02 DIAGNOSIS — I480 Paroxysmal atrial fibrillation: Secondary | ICD-10-CM | POA: Diagnosis not present

## 2018-08-02 NOTE — Patient Instructions (Signed)
You had labs performed today.  You will be contacted with the results of the labs once they are available, usually in the next 3 business days for routine lab work.    Atrial Fibrillation  Atrial fibrillation is a type of heartbeat that is irregular or fast (rapid). If you have this condition, your heart beats without any order. This makes it hard for your heart to pump blood in a normal way. Having this condition gives you more risk for stroke, heart failure, and other heart problems. Atrial fibrillation may start all of a sudden and then stop on its own, or it may become a long-lasting problem. What are the causes? This condition may be caused by heart conditions, such as:  High blood pressure.  Heart failure.  Heart valve disease.  Heart surgery. Other causes include:  Pneumonia.  Obstructive sleep apnea.  Lung cancer.  Thyroid disease.  Drinking too much alcohol. Sometimes the cause is not known. What increases the risk? You are more likely to develop this condition if:  You smoke.  You are older.  You have diabetes.  You are overweight.  You have a family history of this condition.  You exercise often and hard. What are the signs or symptoms? Common symptoms of this condition include:  A feeling like your heart is beating very fast.  Chest pain.  Feeling short of breath.  Feeling light-headed or weak.  Getting tired easily. Follow these instructions at home: Medicines  Take over-the-counter and prescription medicines only as told by your doctor.  If your doctor gives you a blood-thinning medicine, take it exactly as told. Taking too much of it can cause bleeding. Taking too little of it does not protect you against clots. Clots can cause a stroke. Lifestyle      Do not use any tobacco products. These include cigarettes, chewing tobacco, and e-cigarettes. If you need help quitting, ask your doctor.  Do not drink alcohol.  Do not drink  beverages that have caffeine. These include coffee, soda, and tea.  Follow diet instructions as told by your doctor.  Exercise regularly as told by your doctor. General instructions  If you have a condition that causes breathing to stop for a short period of time (apnea), treat it as told by your doctor.  Keep a healthy weight. Do not use diet pills unless your doctor says they are safe for you. Diet pills may make heart problems worse.  Keep all follow-up visits as told by your doctor. This is important. Contact a doctor if:  You notice a change in the speed, rhythm, or strength of your heartbeat.  You are taking a blood-thinning medicine and you see more bruising.  You get tired more easily when you move or exercise.  You have a sudden change in weight. Get help right away if:   You have pain in your chest or your belly (abdomen).  You have trouble breathing.  You have blood in your vomit, poop, or pee (urine).  You have any signs of a stroke. "BE FAST" is an easy way to remember the main warning signs: ? B - Balance. Signs are dizziness, sudden trouble walking, or loss of balance. ? E - Eyes. Signs are trouble seeing or a change in how you see. ? F - Face. Signs are sudden weakness or loss of feeling in the face, or the face or eyelid drooping on one side. ? A - Arms. Signs are weakness or loss of feeling in  an arm. This happens suddenly and usually on one side of the body. ? S - Speech. Signs are sudden trouble speaking, slurred speech, or trouble understanding what people say. ? T - Time. Time to call emergency services. Write down what time symptoms started.  You have other signs of a stroke, such as: ? A sudden, very bad headache with no known cause. ? Feeling sick to your stomach (nausea). ? Throwing up (vomiting). ? Jerky movements you cannot control (seizure). These symptoms may be an emergency. Do not wait to see if the symptoms will go away. Get medical help  right away. Call your local emergency services (911 in the [place] Do not drive yourself to the hospital. Summary  Atrial fibrillation is a type of heartbeat that is irregular or fast (rapid).  You are at higher risk of this condition if you smoke, are older, have diabetes, or are overweight.  Follow your doctor's instructions about medicines, diet, exercise, and follow-up visits.  Get help right away if you think that you have signs of a stroke. This information is not intended to replace advice given to you by your health care provider. Make sure you discuss any questions you have with your health care provider. Document Released: 02/25/2008 Document Revised: 07/09/2017 Document Reviewed: 07/09/2017 Elsevier Interactive Patient Education  2019 [employer].

## 2018-08-02 NOTE — Progress Notes (Addendum)
Subjective: CC: hospital follow up PCP: Emily Norlander, DO PHK:FEXMD [name] is a 68 y.o. female presenting to clinic today for:  1. Hospital follow up Patient recently hospitalized after presenting to the emergency department with symptomatic atrial fibrillation (heart rate was 170 on arrival).  Of note, she underwent the watchman procedure on 07/15/2017.  She is also treated with flecainide, diltiazem and Eliquis.  Her EKG showed nonspecific ST/T wave changes and A. fib with RVR compared to her January 2020 EKG.  Her pulse was noted to be 126 with a blood pressure of 98/86 in the ED.  Vital signs were otherwise within normal limits.  She was cardioverted in the emergency department and advised to follow-up with Dr. Percival Spanish within 2 to 3 weeks of discharge.  Denies any recurrent atrial fibrillation/palpitations.  No GI bleed.  She reports compliance with medications including Eliquis.  Plan for transition off of Eliquis in April following her TEE.  She notes that she will be placed on Plavix and aspirin if appropriate.  Additionally, she does note that she had a low magnesium in the emergency department.  She reports having had this repleted and she spontaneously cardioverted.    ROS: Per HPI  Allergies  Allergen Reactions  . Quinapril Hcl Cough  . Statins Other (See Comments)    Not all Statins but some cause cough and pain in legs.  . Tape Other (See Comments)    Redness, please use "paper" tape   Past Medical History:  Diagnosis Date  . Anemia   . Arthritis   . Atrial fibrillation, rapid ([place]) 10/27/2013  . Bilateral carotid artery disease ([place])   . Diabetes mellitus without complication ([place])   . Difficult intubation    states 'lady that did the sleep study told me I have the smallest airway she has ever seen in an adult"  . Dyslipidemia 06/29/2017  . Dysrhythmia   . Heart murmur   . Hypertension   . Hypothyroid   . Obesity   . Obstructive sleep apnea    on C Pap    . PAF (paroxysmal atrial fibrillation) ([place])    a. newly dx in 09/2013; on eliquis  . UTI (urinary tract infection)     Current Outpatient Medications:  .  apixaban (ELIQUIS) 5 MG TABS tablet, Take 2.5 mg by mouth 2 (two) times daily. , Disp: , Rfl:  .  aspirin EC 81 MG tablet, Take 1 tablet (81 mg total) by mouth daily., Disp: 90 tablet, Rfl: 3 .  blood glucose meter kit and supplies, Dispense based on patient and insurance preference. Use up to four times daily as directed. E11.9, Disp: 1 each, Rfl: 0 .  chlorthalidone (HYGROTON) 25 MG tablet, TAKE 1 TABLET BY MOUTH EVERY DAY, Disp: 90 tablet, Rfl: 2 .  CRANBERRY PO, Take 1 tablet by mouth 2 (two) times daily., Disp: , Rfl:  .  diltiazem (CARDIZEM) 30 MG tablet, TAKE 1 TABLET BY MOUTH EVERY DAY, Disp: 90 tablet, Rfl: 1 .  ezetimibe (ZETIA) 10 MG tablet, Take 10 mg by mouth daily., Disp: , Rfl:  .  flecainide (TAMBOCOR) 150 MG tablet, Take 0.5 tablets (75 mg total) by mouth 2 (two) times daily., Disp: 90 tablet, Rfl: 3 .  furosemide (LASIX) 20 MG tablet, Take 1 tablet (20 mg total) by mouth daily as needed for fluid., Disp: 90 tablet, Rfl: 0 .  glipiZIDE (GLUCOTROL) 5 MG tablet, Take 1 tablet (5 mg total) by mouth 2 (two) times  daily., Disp: 180 tablet, Rfl: 0 .  glucose blood test strip, Test blood sugar once daily as directed. Dx E11.9, Disp: 100 each, Rfl: 12 .  Lancet Device MISC, Use to test sugar once daily E11.9, Disp: 100 each, Rfl: 12 .  levothyroxine (SYNTHROID, LEVOTHROID) 112 MCG tablet, TAKE 1 TABLET BY MOUTH EVERY OTHER DAY, ALTERNATING WITH 1 & 1/2 TABLETS EVERY OTHER DAY, Disp: 112 tablet, Rfl: 3 .  losartan (COZAAR) 25 MG tablet, Take 1 tablet (25 mg total) by mouth daily., Disp: 90 tablet, Rfl: 3 .  lovastatin (MEVACOR) 40 MG tablet, TAKE 1 TABLET BY MOUTH EVERY DAY IN THE EVENING, Disp: 90 tablet, Rfl: 0 .  MATZIM LA 180 MG 24 hr tablet, TAKE ONE TABLET BY MOUTH ONE TIME DAILY, Disp: 60 tablet, Rfl: 3 .  metFORMIN  (GLUCOPHAGE) 1000 MG tablet, Take 1 tablet (1,000 mg total) by mouth 2 (two) times daily with a meal., Disp: 180 tablet, Rfl: 3 .  metoprolol succinate (TOPROL-XL) 100 MG 24 hr tablet, Take 1 tablet (100 mg total) by mouth daily. Take with or immediately following a meal., Disp: 90 tablet, Rfl: 1 .  oxyCODONE-acetaminophen (PERCOCET/ROXICET) 5-325 MG tablet, Take 0.5-1 tablets by mouth every 8 (eight) hours as needed for severe pain., Disp: 30 tablet, Rfl: 0 .  pantoprazole (PROTONIX) 40 MG tablet, Take 1 tablet by mouth daily., Disp: , Rfl:  .  PRESCRIPTION MEDICATION, Apply 1 application topically daily., Disp: , Rfl:  .  rOPINIRole (REQUIP) 0.5 MG tablet, Take 1 tablet (0.5 mg total) by mouth at bedtime., Disp: 90 tablet, Rfl: 1  Current Facility-Administered Medications:  .  cyanocobalamin ((VITAMIN B-12)) injection 1,000 mcg, 1,000 mcg, Intramuscular, Q30 days, Timmothy Euler, MD, 1,000 mcg at 07/20/18 1022 Social History   Socioeconomic History  . Marital status: Single    Spouse name: Not on file  . Number of children: Not on file  . Years of education: Not on file  . Highest education level: Not on file  Occupational History  . Not on file  Social Needs  . Financial resource strain: Not on file  . Food insecurity:    Worry: Not on file    Inability: Not on file  . Transportation needs:    Medical: No    Non-medical: No  Tobacco Use  . Smoking status: Never Smoker  . Smokeless tobacco: Never Used  Substance and Sexual Activity  . Alcohol use: No    Alcohol/week: 0.0 standard drinks  . Drug use: Never  . Sexual activity: Not on file  Lifestyle  . Physical activity:    Days per week: 0 days    Minutes per session: 0 min  . Stress: Not at all  Relationships  . Social connections:    Talks on phone: Twice a week    Gets together: Twice a week    Attends religious service: More than 4 times per year    Active member of club or organization: No    Attends meetings  of clubs or organizations: Never    Relationship status: Never married  . Intimate partner violence:    Fear of current or ex partner: No    Emotionally abused: No    Physically abused: No    Forced sexual activity: No  Other Topics Concern  . Not on file  Social History Narrative  . Not on file   Family History  Problem Relation Age of Onset  . COPD Father   .  Heart failure Father   . Heart disease Father   . Emphysema Father   . Arrhythmia Sister   . Arrhythmia Sister   . Arrhythmia Sister        had PPM also  . Cancer Sister     Objective: Office vital signs reviewed. BP (!) 129/56   Pulse (!) 52   Temp (!) 97.3 F (36.3 C) (Oral)   Ht _0  (1.575 m)   Wt 235 lb (106.6 kg)   BMI 42.98 kg/m   Physical Examination:  General: Awake, alert, well nourished, No acute distress Cardio: regular rate and rhythm, T7R1 heard, systolic murmur appreciated; +2 radial pulses Pulm: clear to auscultation bilaterally, no wheezes, rhonchi or rales; normal work of breathing on room air  Assessment/ Plan: 68 y.o. female   1. PAF (paroxysmal atrial fibrillation) ([place]) I reviewed her emergency department note, results and plan.  She is rate controlled.  Continue prescribed medications by cardiology.  Check magnesium, BMP and CBC. - Magnesium - Basic Metabolic Panel - CBC  2. Hypomagnesemia As above - Magnesium - Basic Metabolic Panel  3. Chronic anticoagulation as above - CBC   Orders Placed This Encounter  Procedures  . Magnesium  . Basic Metabolic Panel  . CBC   No orders of the defined types were placed in this encounter.    Emily Norlander, DO [place] [phone]

## 2018-08-03 LAB — CBC
Hematocrit: 32.9 % — ABNORMAL LOW (ref 34.0–46.6)
Hemoglobin: 10.5 g/dL — ABNORMAL LOW (ref 11.1–15.9)
MCH: 29 pg (ref 26.6–33.0)
MCHC: 31.9 g/dL (ref 31.5–35.7)
MCV: 91 fL (ref 79–97)
Platelets: 275 10*3/uL (ref 150–450)
RBC: 3.62 x10E6/uL — ABNORMAL LOW (ref 3.77–5.28)
RDW: 15.6 % — ABNORMAL HIGH (ref 11.7–15.4)
WBC: 8.7 10*3/uL (ref 3.4–10.8)

## 2018-08-03 LAB — BASIC METABOLIC PANEL
BUN/Creatinine Ratio: 22 (ref 12–28)
BUN: 18 mg/dL (ref 8–27)
CO2: 25 mmol/L (ref 20–29)
Calcium: 9.3 mg/dL (ref 8.7–10.3)
Chloride: 98 mmol/L (ref 96–106)
Creatinine, Ser: 0.81 mg/dL (ref 0.57–1.00)
GFR calc Af Amer: 86 mL/min/{1.73_m2} (ref >59)
GFR calc non Af Amer: 75 mL/min/{1.73_m2} (ref >59)
Glucose: 188 mg/dL — ABNORMAL HIGH (ref 65–99)
Potassium: 4.3 mmol/L (ref 3.5–5.2)
Sodium: 138 mmol/L (ref 134–144)

## 2018-08-03 LAB — MAGNESIUM: Magnesium: 2 mg/dL (ref 1.6–2.3)

## 2018-08-07 ENCOUNTER — Encounter: Payer: Self-pay | Admitting: Cardiology

## 2018-08-07 NOTE — Progress Notes (Signed)
HPI The patient presents for followup of atrial fib.   The patient has paroxysmal atrial fibrillation and is being treated with flecainide. She has had recurrent GI bleeding and could not tolerate anticoagulation.  Since I last saw her she had a Watchman at [facility]  She states she did go back into atrial fibrillation.  She was low on magnesium and went to the emergency room.  They were about to cardiovert her when she converted spontaneously.  She otherwise has done well.  She denies any chest pressure, neck or arm discomfort.  She has had no palpitations, presyncope or syncope.  She has had no weight gain or edema.   Allergies  Allergen Reactions  . Quinapril Hcl Cough  . Statins Other (See Comments)    Not all Statins but some cause cough and pain in legs.  . Tape Other (See Comments)    Redness, please use "paper" tape    Current Outpatient Medications  Medication Sig Dispense Refill  . apixaban (ELIQUIS) 5 MG TABS tablet Take 2.5 mg by mouth 2 (two) times daily.     Marland Kitchen aspirin EC 81 MG tablet Take 1 tablet (81 mg total) by mouth daily. 90 tablet 3  . blood glucose meter kit and supplies Dispense based on patient and insurance preference. Use up to four times daily as directed. E11.9 1 each 0  . chlorthalidone (HYGROTON) 25 MG tablet TAKE 1 TABLET BY MOUTH EVERY DAY 90 tablet 2  . CRANBERRY PO Take 1 tablet by mouth 2 (two) times daily.    Marland Kitchen diltiazem (CARDIZEM) 30 MG tablet TAKE 1 TABLET BY MOUTH EVERY DAY 90 tablet 1  . ezetimibe (ZETIA) 10 MG tablet Take 10 mg by mouth daily.    . flecainide (TAMBOCOR) 150 MG tablet Take 0.5 tablets (75 mg total) by mouth 2 (two) times daily. 90 tablet 3  . furosemide (LASIX) 20 MG tablet Take 1 tablet (20 mg total) by mouth daily as needed for fluid. 90 tablet 0  . glipiZIDE (GLUCOTROL) 5 MG tablet Take 1 tablet (5 mg total) by mouth 2 (two) times daily. 180 tablet 0  . glucose blood test strip Test blood sugar once daily as directed. Dx  E11.9 100 each 12  . Lancet Device MISC Use to test sugar once daily E11.9 100 each 12  . levothyroxine (SYNTHROID, LEVOTHROID) 112 MCG tablet TAKE 1 TABLET BY MOUTH EVERY OTHER DAY, ALTERNATING WITH 1 & 1/2 TABLETS EVERY OTHER DAY 112 tablet 3  . losartan (COZAAR) 25 MG tablet Take 1 tablet (25 mg total) by mouth daily. 90 tablet 3  . lovastatin (MEVACOR) 40 MG tablet TAKE 1 TABLET BY MOUTH EVERY DAY IN THE EVENING 90 tablet 0  . MATZIM LA 180 MG 24 hr tablet TAKE ONE TABLET BY MOUTH ONE TIME DAILY 60 tablet 3  . metFORMIN (GLUCOPHAGE) 1000 MG tablet Take 1 tablet (1,000 mg total) by mouth 2 (two) times daily with a meal. 180 tablet 3  . metoprolol succinate (TOPROL-XL) 100 MG 24 hr tablet Take 1 tablet (100 mg total) by mouth daily. Take with or immediately following a meal. 90 tablet 1  . oxyCODONE-acetaminophen (PERCOCET/ROXICET) 5-325 MG tablet Take 0.5-1 tablets by mouth every 8 (eight) hours as needed for severe pain. 30 tablet 0  . pantoprazole (PROTONIX) 40 MG tablet Take 1 tablet by mouth daily.    Marland Kitchen PRESCRIPTION MEDICATION Apply 1 application topically daily.    Marland Kitchen rOPINIRole (REQUIP) 0.5  MG tablet Take 1 tablet (0.5 mg total) by mouth at bedtime. 90 tablet 1   Current Facility-Administered Medications  Medication Dose Route Frequency Provider Last Rate Last Dose  . cyanocobalamin ((VITAMIN B-12)) injection 1,000 mcg  1,000 mcg Intramuscular Q30 days Timmothy Euler, MD   1,000 mcg at 07/20/18 1022    Past Medical History:  Diagnosis Date  . Anemia   . Arthritis   . Atrial fibrillation, rapid ([place]) 10/27/2013  . Bilateral carotid artery disease ([place])   . Diabetes mellitus without complication ([place])   . Difficult intubation    states 'lady that did the sleep study told me I have the smallest airway she has ever seen in an adult"  . Dyslipidemia 06/29/2017  . Dysrhythmia   . Hypertension   . Hypothyroid   . Obesity   . Obstructive sleep apnea    on C Pap  . PAF (paroxysmal  atrial fibrillation) ([place])    a. newly dx in 09/2013; on eliquis    Past Surgical History:  Procedure Laterality Date  . CATARACT EXTRACTION Left   . CATARACT EXTRACTION W/PHACO Right 12/09/2015   Procedure: CATARACT EXTRACTION PHACO AND INTRAOCULAR LENS PLACEMENT (IOC);  Surgeon: Tonny Branch, MD;  Location: [facility];  Service: Ophthalmology;  Laterality: Right;  CDE:10.48  . COLONOSCOPY W/ POLYPECTOMY    . cyst on back of neck removed    . DILATION AND CURETTAGE OF UTERUS     x 5  . ENDARTERECTOMY Left 08/30/2015   Procedure: LEFT CAROYID ENDARTERECTOMY WITH XENOSURE BOVINE PERICARDIUM PATCH ANGIOPLASTY;  Surgeon: [name], MD;  Location: [place];  Service: Vascular;  Laterality: Left;  . EYE SURGERY    . TONSILLECTOMY    . UPPER GI ENDOSCOPY      ROS:  As stated in the HPI and negative for all other systems.  PHYSICAL EXAM BP 134/60 (BP Location: Left Arm, Patient Position: Sitting, Cuff Size: Large)   Pulse (!) 58   Ht 5' 2"  (1.575 m)   Wt 235 lb 3.2 oz (106.7 kg)   BMI 43.02 kg/m   GENERAL:  Well appearing NECK:  No jugular venous distention, waveform within normal limits, carotid upstroke brisk and symmetric, no bruits, no thyromegaly LUNGS:  Clear to auscultation bilaterally CHEST:  Unremarkable HEART:  PMI not displaced or sustained,S1 and S2 within normal limits, no S3, no S4, no clicks, no rubs, no murmurs ABD:  Flat, positive bowel sounds normal in frequency in pitch, no bruits, no rebound, no guarding, no midline pulsatile mass, no hepatomegaly, no splenomegaly EXT:  2 plus pulses throughout, no edema, no cyanosis no clubbing   EKG:   Sinus rhythm, rate 58, left axis deviation, poor anterior R wave progression, no acute ST-T wave changes.   ASSESSMENT AND PLAN  ATRIAL FIB:   .  [name] has a CHA2DS2 - VASc score of 4 with a risk of stroke of 4%.  I will defer to her cardiology team at [facility] as to when she can stop her Eliquis.  GI BLEEDING:   She is  had no GI bleeding during the interim while she is been on anticoagulation.  HTN: Blood pressure is at target.  No change in therapy.   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   She has follow-up carotid Dopplers scheduled.   OBESITY:   We have talked about weight loss and exercise and specific plans.  DM: A1c is going up at 9.3.  She would benefit from SGLT2i.   Will start Jardiance.  We discussed potential side effects.  I am going to do this in the context of the COORDINATE Diabetes Trial.

## 2018-08-08 ENCOUNTER — Other Ambulatory Visit: Payer: Self-pay

## 2018-08-08 DIAGNOSIS — I6523 Occlusion and stenosis of bilateral carotid arteries: Secondary | ICD-10-CM

## 2018-08-09 ENCOUNTER — Ambulatory Visit (INDEPENDENT_AMBULATORY_CARE_PROVIDER_SITE_OTHER): Payer: Medicare Other | Admitting: Cardiology

## 2018-08-09 ENCOUNTER — Encounter: Payer: Self-pay | Admitting: Cardiology

## 2018-08-09 VITALS — BP 134/60 | HR 58 | Ht 62.0 in | Wt 235.2 lb

## 2018-08-09 DIAGNOSIS — I480 Paroxysmal atrial fibrillation: Secondary | ICD-10-CM | POA: Diagnosis not present

## 2018-08-09 DIAGNOSIS — I6523 Occlusion and stenosis of bilateral carotid arteries: Secondary | ICD-10-CM

## 2018-08-09 MED ORDER — EMPAGLIFLOZIN 10 MG PO TABS: 10.0000 mg | ORAL_TABLET | Freq: Every day | ORAL | 11 refills | Status: DC

## 2018-08-09 NOTE — Patient Instructions (Addendum)
Medication Instructions:  START- Jardiance 10 mg daily  If you need a refill on your cardiac medications before your next appointment, please call your pharmacy.  Labwork: None Ordered   Testing/Procedures: None Ordered  Follow-Up: You will need a follow up appointment in 6 Months.  Please call our office 2 months in advance to schedule this appointment.  You may see Minus Breeding, MD or one of the following Advanced Practice Providers on your designated Care Team:   [name], PA-C . Jory Sims, DNP, ANP      At [facility], you and your health needs are our priority.  As part of our continuing mission to provide you with exceptional heart care, we have created designated Provider Care Teams.  These Care Teams include your primary Cardiologist (physician) and Advanced Practice Providers (APPs -  Physician Assistants and Nurse Practitioners) who all work together to provide you with the care you need, when you need it.  Thank you for choosing [facility]!!

## 2018-08-10 ENCOUNTER — Encounter: Payer: Self-pay | Admitting: *Deleted

## 2018-08-10 DIAGNOSIS — Z006 Encounter for examination for normal comparison and control in clinical research program: Secondary | ICD-10-CM

## 2018-08-10 NOTE — Research (Addendum)
Patient met criteria for the Coordinate Diabetes trial.  COORDINATE-Diabetes BASELINE CASE REPORT FORM (Intervention) Site #:  [id number]              Patient ID:   [id number]               INCLUSION CRITERIA  1. Is patient 18 years or older? [] No   [x] Yes  2. Based on the clinical record, does the patient have a history of type 2 diabetes mellitus? [] No   [x] Yes  3. Based on the clinical record, does the patient have a history of atherosclerotic cardiovascular disease, as defined by at least one of the clinical criteria below? [] No   [x] Yes   IF YES: Coronary Artery Disease Prior myocardial infarction Prior coronary artery bypass graft surgery Prior percutaneous coronary intervention Documented obstructive (i.e. ?50%) coronary artery disease (by angiography or CT) [x] No  [] Yes [x] No  [] Yes [x] No  [] Yes [x] No  [] Yes IF YES: date of MOST RECENT event: / /          mm dd yyyy   Cerebrovascular Disease Prior ischemic stroke Carotid artery stenosis (?50%) [x] No []  Yes [] No [x]  Yes IF YES: date of MOST RECENT event:   09  /16 / 19         mm dd yyyy   Peripheral Arterial Disease Prior peripheral revascularization Prior amputation due to poor circulation History of Claudication with Ankle-brachial index <0.9 [x] No []  Yes [x] No []  Yes [x] No []  Yes IF YES: date of MOST RECENT event:          / /           mm dd yyyy  4. Patient's baseline guideline-based therapy score:    1 point Currently prescribed angiotensin converting enzyme inhibitor (ACEi), angiotensin receptor blocker (ARB) or Angiotensin Receptor Neprilysin inhibitor (ARNi) therapy [] No [x]  Yes   1 point Currently prescribed high intensity statin therapy (atorvastatin 40-80mg daily OR rosuvastatin 20-40mg daily) [x] No []  Yes   1 point Most recent HbA1c <7% on metformin monotherapy? [x] No []  Yes    Calculated score:1    EXCLUSION CRITERIA  REVIEW EACH CONDITION AND SELECT YES IF THE STATEMENT IS TRUE OR NO IF THE STATEMENT IS FALSE.   1. Determined to be highly unlikely to survive and/or to continue follow-up in that clinic for at least 1 year, as identified by site investigator [x] No [] Yes  2. eGFR ? 30 ml/min/1.30m [x] No [] Yes  3. Baseline composite score of 3 for guideline recommended therapy [x] No [] Yes  4. Absolute contraindication to any of the 3 guideline recommended therapies    ACEi AND ARB therapy [x] No []  Yes    High intensity statin therapy [x] No  [] Yes    SGLT2I AND GLP1RA therapy [x] No  [] Yes   5. Already taking SGLT2i or GLP1RA therapy at baseline [x] No [] Yes     Subject Name: Emily [name] Subject met inclusion and exclusion criteria.  The informed consent form, study requirements and expectations were reviewed with the subject and questions and concerns were addressed prior to the signing of the consent form.  The subject verbalized understanding of the trial requirements.  The subject agreed to participate in the [place] Diabetes trial and signed the informed consent on 08/09/2018 at 4:51pm.The informed consent was obtained prior to performance of any protocol-specific procedures for the subject.  A copy of the signed informed consent was given to the subject and a copy was placed in the subject's medical record.   Emily [name]  MEDICAL HISTORY  Other Cardiovascular  Atrial Fibrillation or Atrial Flutter [] No [x] Yes  Hypertension [] No [x] Yes  Hyperlipidemia [] No [x] Yes  Cigarette smoking [] Current [] Former [x] Never  Non-Cardiovascular  Anemia (Hgb<lower limit normal) [] No [x] Yes  Asthma [x] No [] Yes  Chronic lung disease [x] No [] Yes  Cancer - leukemia, lymphoma, or localized solid tumor [x] No [] Yes  Cancer - metastatic solid tumor [x] No [] Yes  Mild Liver Disease (Cirrhosis without portal hypertension) [x] No [] Yes  Moderate/Severe Liver Disease (Cirrhosis with portal hypertension) [x] No [] Yes  Connective Tissue Disease [x] No [] Yes  Dementia [x] No [] Yes  Depression [x] No [] Yes   Hemiplegia or paraplegia [x] No [] Yes  Human immunodeficiency virus [x] No [] Yes  Obstructive Sleep Apnea [x] No [] Yes  Peptic Ulcer Disease [x] No [] Yes  Renal insufficiency [x] No [] Yes  Drug or Alcohol Abuse [] Current [] Former [x] Never  Diabetes History  Year of diagnosis of type 2 diabetes Per Patient 2012   History of diabetic ketoacidosis? [x] No [] Yes   Are any of the following complications of diabetes present (Select all that apply): [] Retinopathy [] Neuropathy [] Foot complications      (including ulcers, calluses,         amputation) [] Gastroparesis [x] None of the above        [] No [x] Yes  Subject educated on Coordinate diabetes and all educational materials given to subject.

## 2018-08-15 ENCOUNTER — Other Ambulatory Visit: Payer: Self-pay

## 2018-08-15 ENCOUNTER — Ambulatory Visit (HOSPITAL_COMMUNITY)
Admission: RE | Admit: 2018-08-15 | Discharge: 2018-08-15 | Disposition: A | Discharge status: Home / Self Care | Payer: Medicare Other | Source: Ambulatory Visit | Attending: Family | Admitting: Family

## 2018-08-15 ENCOUNTER — Encounter: Payer: Self-pay | Admitting: Family

## 2018-08-15 ENCOUNTER — Telehealth: Payer: Self-pay | Admitting: Family Medicine

## 2018-08-15 ENCOUNTER — Ambulatory Visit (INDEPENDENT_AMBULATORY_CARE_PROVIDER_SITE_OTHER): Payer: Medicare Other | Admitting: Family

## 2018-08-15 ENCOUNTER — Ambulatory Visit (INDEPENDENT_AMBULATORY_CARE_PROVIDER_SITE_OTHER): Payer: Medicare Other | Admitting: *Deleted

## 2018-08-15 VITALS — BP 136/52 | HR 56 | Temp 97.1°F | Resp 14 | Ht 62.5 in | Wt 232.0 lb

## 2018-08-15 DIAGNOSIS — E538 Deficiency of other specified B group vitamins: Secondary | ICD-10-CM | POA: Diagnosis not present

## 2018-08-15 DIAGNOSIS — Z9889 Other specified postprocedural states: Secondary | ICD-10-CM | POA: Diagnosis not present

## 2018-08-15 DIAGNOSIS — I6523 Occlusion and stenosis of bilateral carotid arteries: Secondary | ICD-10-CM | POA: Diagnosis not present

## 2018-08-15 MED ORDER — GLIPIZIDE 5 MG PO TABS: 5.0000 mg | ORAL_TABLET | Freq: Two times a day (BID) | ORAL | 0 refills | Status: DC

## 2018-08-15 NOTE — Progress Notes (Signed)
Pt given Cyanocobalamin inj Tolerated well 

## 2018-08-15 NOTE — Telephone Encounter (Signed)
done

## 2018-08-15 NOTE — Progress Notes (Signed)
Chief Complaint: Follow up Extracranial Carotid Artery Stenosis   History of Present Illness  Emily [name] is a 68 y.o. female who is status post left carotid endarterectomy with bovine pericardial patch angioplasty on  03/01/2017by Dr. Trula Slade. This was done for asymptomatic bilateral carotid stenosis, left greater than right. Intraoperative findings included a 85% stenosis. Her postoperative follow-up was uncomplicated and she was discharged to home the following day.She stated that her left middle finger was a little numb after the procedure. She was a very difficult A-line.   She reports no new neurologic symptoms. She has had pain in her left leg which has been attributed to back issues.  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   Dr. Trula Slade last evaluated Emily [name] on 08-03-16. At that timeright extracranial carotid arterystenosis remainedin the 60-79% category. The left endarterectomy sitewas widely patent.The patient remainedneurologically intact. We will continue with duplex ultrasound surveillance in 6 months.  Shedenies any known history of stroke or TIA. Specifically she deniesa history of amaurosis fugax or monocular blindness, unilateral facial drooping, hemiplegia, orreceptive or expressive aphasia.  She denies claudication type symptoms with walkingwhen her iron levels are closer to normal, otherwise she feels generalized fatigue and weakness.  She states the use of her C-PAP has helped control her atrial fib.  She is getting iron infusions managed by her hematologist. 2 bleeding polyps found in her stomach and had this addressed by GI in [place]; but states she still is anemic and requires iron infusions.   She had a Watchman procedure in February 2020 at [facility]. Her cardiologist is Dr. Percival Spanish with [facility]. She will have a follow up TEE, then may stop the Eliquis and take Plavix and ASA 81 mg.    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 Percival Spanish.    Past Medical History:  Diagnosis Date  . Anemia   . Arthritis   . Atrial fibrillation, rapid ([place]) 10/27/2013  . Bilateral carotid artery disease ([place])   . Diabetes mellitus without complication ([place])   . Difficult intubation    states 'lady that did the sleep study told me I have the smallest airway she has ever seen in an adult"  . Dyslipidemia 06/29/2017  . Dysrhythmia   . Hypertension   . Hypothyroid   . Obesity   . Obstructive sleep apnea    on C Pap  . PAF (paroxysmal atrial fibrillation) ([place])    a. newly dx in 09/2013; on eliquis    Social History Social History   Tobacco Use  . Smoking status: Never Smoker  . Smokeless tobacco: Never Used  Substance Use Topics  . Alcohol use: No    Alcohol/week: 0.0 standard drinks  . Drug use: Never    Family History Family History  Problem Relation Age of Onset  . COPD Father   . Heart failure Father   . Heart disease Father   . Emphysema Father   . Arrhythmia Sister   . Arrhythmia Sister   . Arrhythmia Sister        had PPM also  . Cancer Sister     Surgical History Past Surgical History:  Procedure Laterality Date  . CATARACT EXTRACTION Left   . CATARACT EXTRACTION W/PHACO Right 12/09/2015   Procedure: CATARACT  EXTRACTION PHACO AND INTRAOCULAR LENS PLACEMENT (IOC);  Surgeon: Tonny Branch, MD;  Location: [facility];  Service: Ophthalmology;  Laterality: Right;  CDE:10.48  . COLONOSCOPY W/ POLYPECTOMY    . cyst on back of neck removed    . DILATION AND CURETTAGE OF UTERUS     x 5  . ENDARTERECTOMY Left 08/30/2015   Procedure: LEFT CAROYID ENDARTERECTOMY WITH XENOSURE BOVINE PERICARDIUM PATCH ANGIOPLASTY;  Surgeon: [name], MD;  Location: [place];  Service:  Vascular;  Laterality: Left;  . EYE SURGERY    . TONSILLECTOMY    . UPPER GI ENDOSCOPY      Allergies  Allergen Reactions  . Quinapril Hcl Cough  . Statins Other (See Comments)    Not all Statins but some cause cough and pain in legs.  . Tape Other (See Comments)    Redness, please use "paper" tape    Current Outpatient Medications  Medication Sig Dispense Refill  . apixaban (ELIQUIS) 5 MG TABS tablet Take 2.5 mg by mouth 2 (two) times daily.     . blood glucose meter kit and supplies Dispense based on patient and insurance preference. Use up to four times daily as directed. E11.9 1 each 0  . chlorthalidone (HYGROTON) 25 MG tablet TAKE 1 TABLET BY MOUTH EVERY DAY 90 tablet 2  . CRANBERRY PO Take 1 tablet by mouth 2 (two) times daily.    Marland Kitchen diltiazem (CARDIZEM) 30 MG tablet TAKE 1 TABLET BY MOUTH EVERY DAY 90 tablet 1  . empagliflozin (JARDIANCE) 10 MG TABS tablet Take 10 mg by mouth daily. 30 tablet 11  . ezetimibe (ZETIA) 10 MG tablet Take 10 mg by mouth daily.    . flecainide (TAMBOCOR) 150 MG tablet Take 0.5 tablets (75 mg total) by mouth 2 (two) times daily. 90 tablet 3  . furosemide (LASIX) 20 MG tablet Take 1 tablet (20 mg total) by mouth daily as needed for fluid. 90 tablet 0  . glipiZIDE (GLUCOTROL) 5 MG tablet Take 1 tablet (5 mg total) by mouth 2 (two) times daily. 180 tablet 0  . glucose blood test strip Test blood sugar once daily as directed. Dx E11.9 100 each 12  . Lancet Device MISC Use to test sugar once daily E11.9 100 each 12  . levothyroxine (SYNTHROID, LEVOTHROID) 112 MCG tablet TAKE 1 TABLET BY MOUTH EVERY OTHER DAY, ALTERNATING WITH 1 & 1/2 TABLETS EVERY OTHER DAY 112 tablet 3  . losartan (COZAAR) 25 MG tablet Take 1 tablet (25 mg total) by mouth daily. 90 tablet 3  . lovastatin (MEVACOR) 40 MG tablet TAKE 1 TABLET BY MOUTH EVERY DAY IN THE EVENING 90 tablet 0  . MATZIM LA 180 MG 24 hr tablet TAKE ONE TABLET BY MOUTH ONE TIME DAILY 60 tablet 3  . metFORMIN  (GLUCOPHAGE) 1000 MG tablet Take 1 tablet (1,000 mg total) by mouth 2 (two) times daily with a meal. 180 tablet 3  . metoprolol succinate (TOPROL-XL) 100 MG 24 hr tablet Take 1 tablet (100 mg total) by mouth daily. Take with or immediately following a meal. 90 tablet 1  . oxyCODONE-acetaminophen (PERCOCET/ROXICET) 5-325 MG tablet Take 0.5-1 tablets by mouth every 8 (eight) hours as needed for severe pain. 30 tablet 0  . pantoprazole (PROTONIX) 40 MG tablet Take 1 tablet by mouth daily.    Marland Kitchen PRESCRIPTION MEDICATION Apply 1 application topically daily.    Marland Kitchen rOPINIRole (REQUIP) 0.5 MG tablet Take 1 tablet (0.5 mg total) by mouth at bedtime.  90 tablet 1   Current Facility-Administered Medications  Medication Dose Route Frequency Provider Last Rate Last Dose  . cyanocobalamin ((VITAMIN B-12)) injection 1,000 mcg  1,000 mcg Intramuscular Q30 days Timmothy Euler, MD   1,000 mcg at 08/15/18 8413    Review of Systems : See HPI for pertinent positives and negatives.  Physical Examination  Vitals:   08/15/18 1032  BP: (!) 136/52  Pulse: (!) 56  Resp: 14  Temp: (!) 97.1 F (36.2 C)  TempSrc: Oral  SpO2: 98%  Weight: 232 lb (105.2 kg)  Height: 5' 2.5" (1.588 m)   Body mass index is 41.76 kg/m.  General: WDWN morbidly obese female in NAD GAIT: normal Eyes: PERRLA HENT:  Large neck Pulmonary:  Respirations are non-labored, distant breath sounds in all fields, CTAB, no rales, rhonchi, or wheezing. Cardiac: regular rhythm, bradycardic (on a beta blocker) + murmur.  VASCULAR EXAM Carotid Bruits Right Left   Positive Positive     Abdominal aortic pulse is not palpable. Radial pulses are 2+ palpable and equal.                                                                                                                            LE Pulses Right Left       POPLITEAL  not palpable   not palpable       POSTERIOR TIBIAL  not palpable   1+ palpable        DORSALIS PEDIS      ANTERIOR  TIBIAL not palpable  not palpable     Gastrointestinal: soft, nontender, BS WNL, no r/g, no palpable masses. Large panus. Musculoskeletal: no muscle atrophy/wasting. M/S 5/5 throughout, extremities without ischemic changes Skin: No rashes, no ulcers, no cellulitis.   Neurologic:  A&O X 3; appropriate affect, sensation is normal; speech is normal, CN 2-12 intact, pain and light touch intact in extremities, motor exam as listed above. Psychiatric: Normal thought content, mood appropriate to clinical situation.    Assessment: Brendia Dampier is a 68 y.o. female whois status post left carotid endarterectomy with bovine pericardial patch angioplasty on  07/31/2015. She has no history of stroke or TIA.   Her atherosclerotic risk factors includeuncontrolledDM, PAF (takes Eliquis), OSA (uses CPAP), and morbid obesity.  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   DATA Carotid Duplex (08-15-18): Right Carotid: Velocities in the right ICA are consistent with a 60-79%                stenosis. The ECA appears >50% stenosed. Left Carotid: Velocities in the left ICA are consistent with a 1-39% stenosis.               The ECA appears >50% stenosed. Right vertebral artery flow is antegrade, left is absent.  Bilateral subclavian artery waveforms are normal.  No change in the right ICA stenosis compared to the exam on  02-14-18, seems to be less stenosis in the left ICA.    Plan: Follow-up in 6 months with Carotid Duplex scan.   I discussed in depth with the patient the nature of atherosclerosis, and emphasized the importance of maximal medical management including strict control of blood pressure, blood glucose, and lipid levels, obtaining regular exercise, and continued cessation of smoking.  The patient is aware that without maximal medical management the underlying atherosclerotic disease process will progress, limiting the benefit of  any interventions. The patient was given information about stroke prevention and what symptoms should prompt the patient to seek immediate medical care. Thank you for allowing [place] to participate in this patient's care.  Clemon Chambers, RN, MSN, FNP-C [facility] Office: [facility] Physician: Trula Slade  08/15/18 11:33 AM

## 2018-08-15 NOTE — Patient Instructions (Signed)

## 2018-08-15 NOTE — Telephone Encounter (Signed)
Pt is needing a refill on  glipiZIDE (GLUCOTROL) 5 MG tablet  [facility]

## 2018-08-25 ENCOUNTER — Telehealth: Payer: Self-pay | Admitting: Cardiology

## 2018-08-25 NOTE — Telephone Encounter (Signed)
Any ideas?  Please advise?

## 2018-08-25 NOTE — Telephone Encounter (Signed)
Called patient, LVM advising of switch to medication.  Left call back number if questions.

## 2018-08-25 NOTE — Telephone Encounter (Signed)
Send rx for dltiazem ER 180mg  daily.  The problem may be the brand name we used for the prescription.

## 2018-08-25 NOTE — Telephone Encounter (Signed)
New Message         Pt c/o medication issue:  1. Name of Medication: Matzim 180   2. How are you currently taking this medication (dosage and times per day)? 1 x a day  3. Are you having a reaction (difficulty breathing--STAT)? No   4. What is your medication issue? Insurance not covering need something compatible.

## 2018-08-27 ENCOUNTER — Other Ambulatory Visit: Payer: Self-pay | Admitting: Family Medicine

## 2018-08-28 IMAGING — US US ABDOMEN LIMITED
1 series · 14 of 25 positions shown · non-contrast
Comparison: CT 05/26/2014.

CLINICAL DATA: Right upper quadrant pain.  Nausea and vomiting.

EXAM:
ULTRASOUND ABDOMEN LIMITED RIGHT UPPER QUADRANT

[Series 1: us abdomen limited · 0.25mm/px · 14 of 38 slices shown]
[im 1/38]
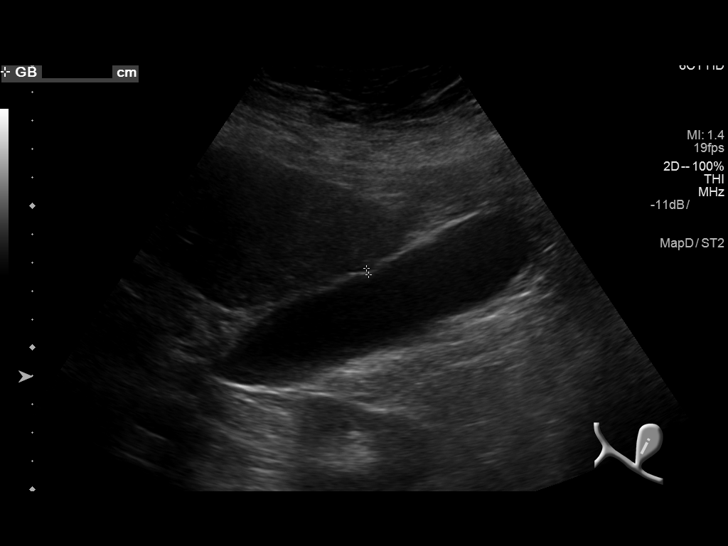
[im 4/38]
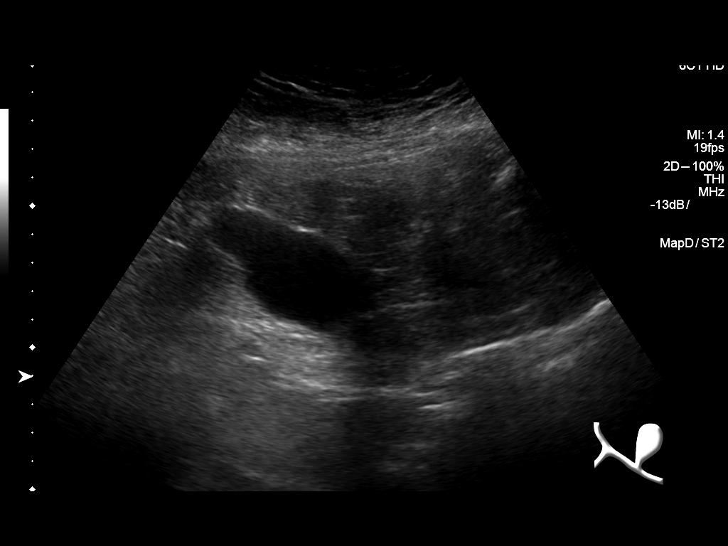
[im 7/38]
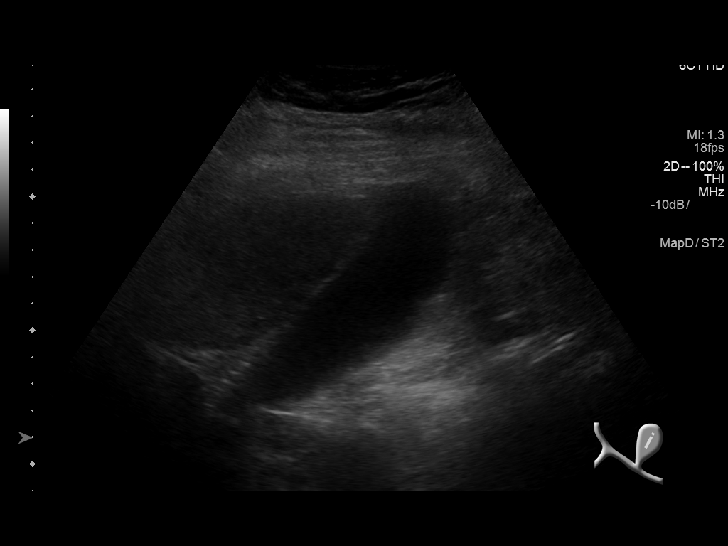
[im 10/38]
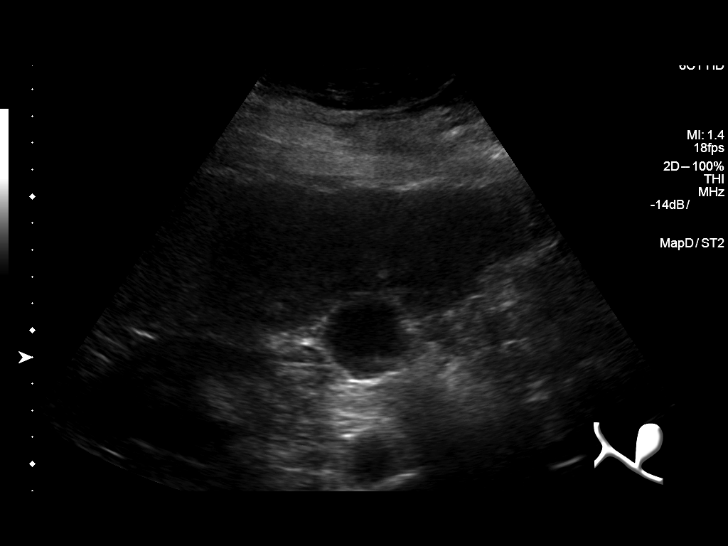
[im 13/38]
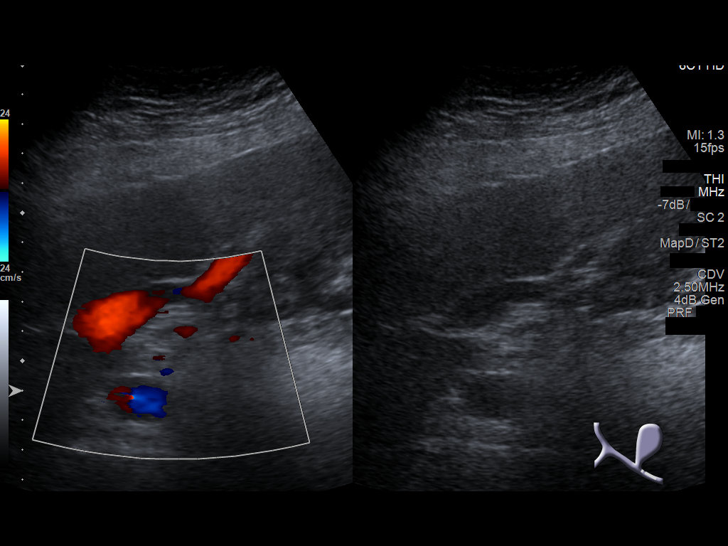
[im 14/38]
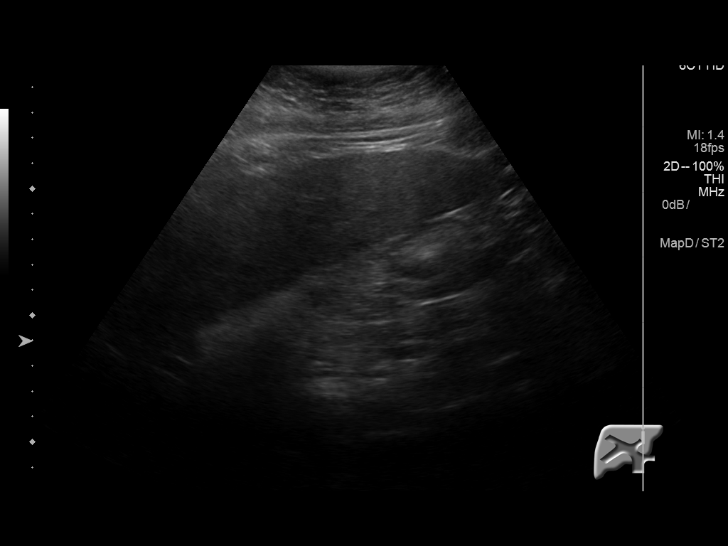
[im 17/38]
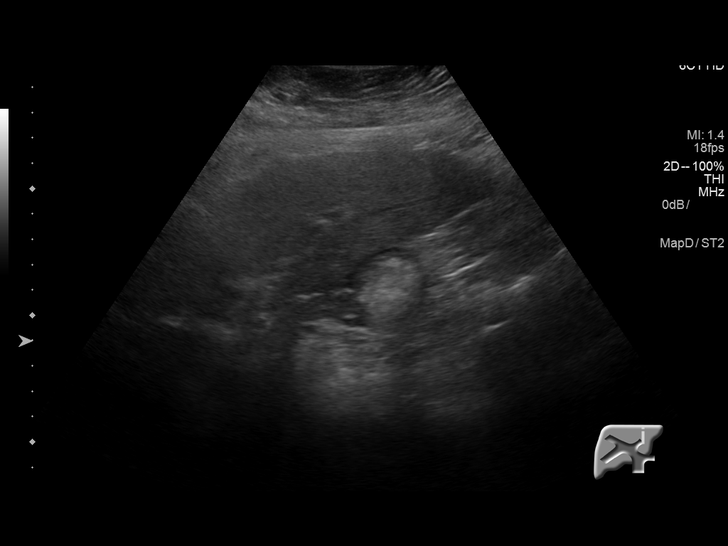
[im 21/38]
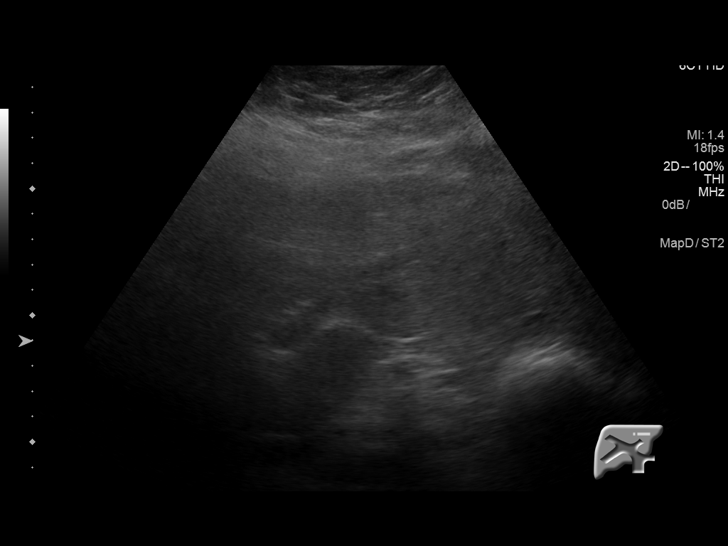
[im 24/38]
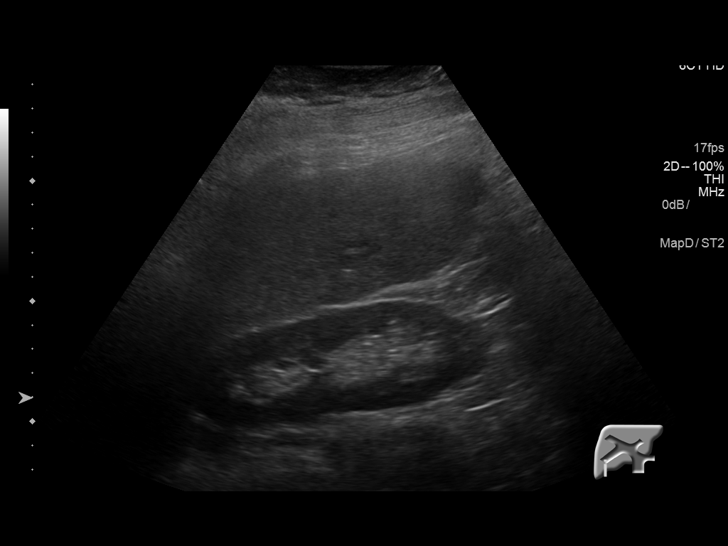
[im 25/38]
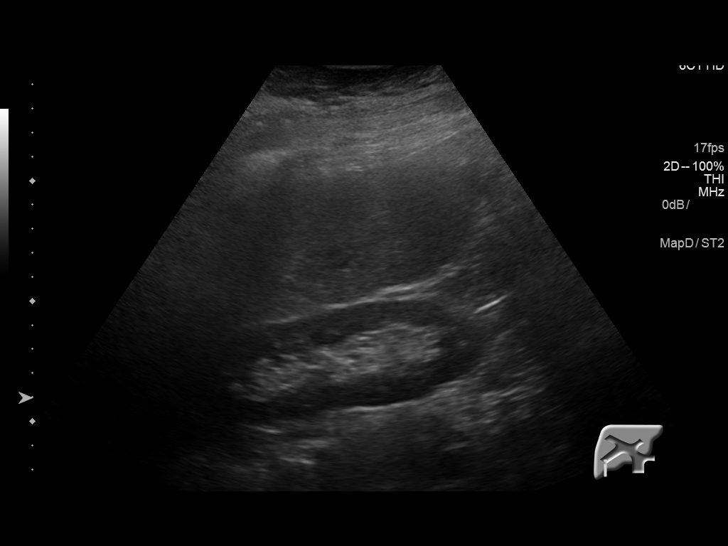
[im 28/38]
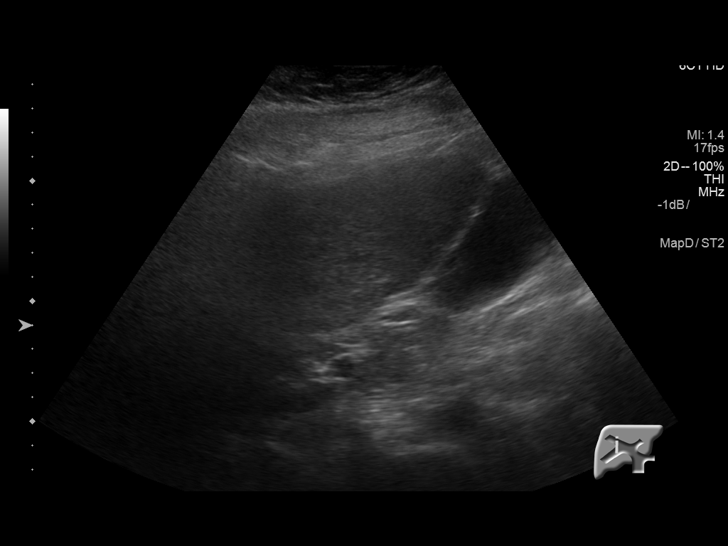
[im 31/38]
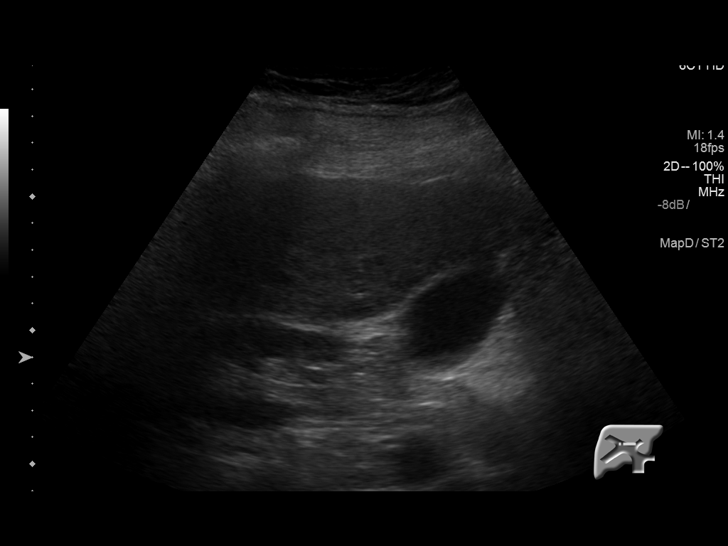
[im 34/38]
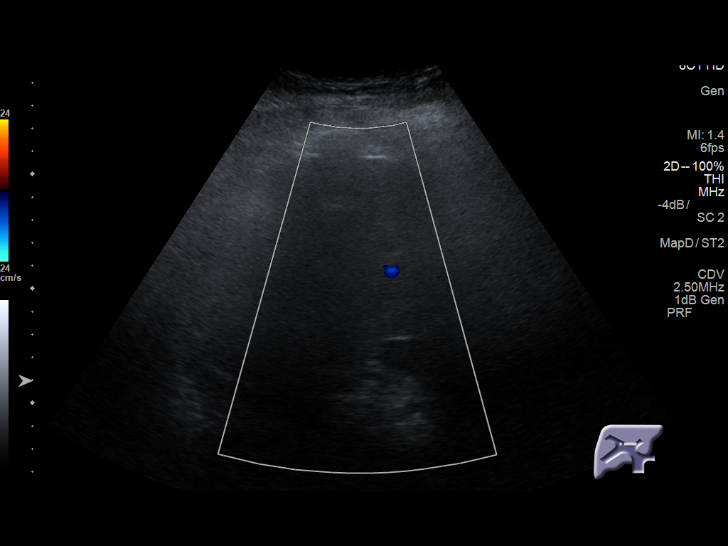
[im 38/38]
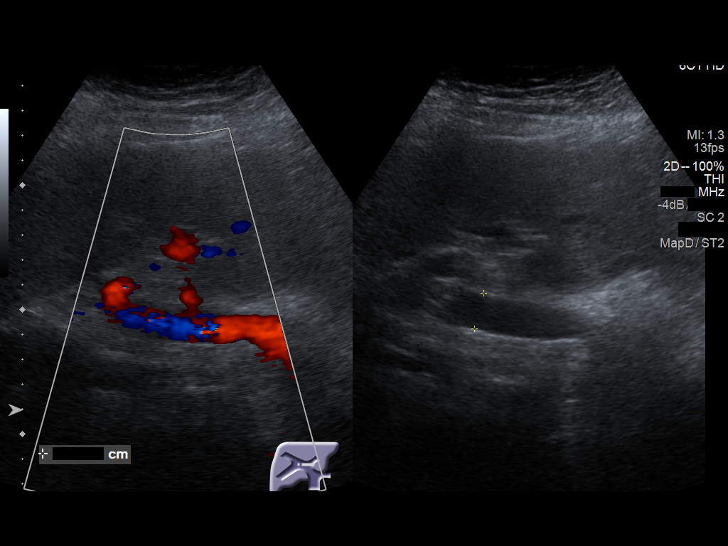

[14 of 25 positions shown; findings below may reference images not displayed]

FINDINGS: Gallbladder:

No gallstones or wall thickening visualized. No sonographic Murphy
sign noted by sonographer.

Common bile duct:

Diameter: 4 mm

Liver:

Increased echogenicity consistent fatty infiltration and/or
hepatocellular disease. No focal hepatic abnormality identified.
Portal vein is patent on color Doppler imaging with normal direction
of blood flow towards the liver.
IMPRESSION: 1. Increased hepatic echogenicity consistent fatty infiltration
and/or hepatocellular disease. No focal hepatic abnormality
identified.

2.  No gallstones or biliary distention.

## 2018-08-30 ENCOUNTER — Telehealth: Payer: Self-pay | Admitting: Cardiology

## 2018-08-30 MED ORDER — EZETIMIBE 10 MG PO TABS: 10.0000 mg | ORAL_TABLET | Freq: Every day | ORAL | 3 refills | Status: DC

## 2018-08-30 MED ORDER — DILTIAZEM HCL ER COATED BEADS 180 MG PO CP24: 180.0000 mg | ORAL_CAPSULE | Freq: Every day | ORAL | 3 refills | Status: DC

## 2018-08-30 NOTE — Telephone Encounter (Signed)
placed on hold for >5 mnutes Medication e-sent to pharmacy

## 2018-08-30 NOTE — Telephone Encounter (Signed)
New Message said D Hochein was supposed to be changing pt's Matzim LA 100 mg to another Diltiazem form. She says they still not have received it.      *STAT* If patient is at the pharmacy, call can be transferred to refill team.   1. Which medications need to be refilled? (please list name of each medication and dose if known) refill on generic Zetia  2. Which pharmacy/location (including street and city if local pharmacy) is medication to be sent to?    [phone]  3. Do they need a 30 day or 90 day supply? 90 and refills

## 2018-09-13 ENCOUNTER — Ambulatory Visit (INDEPENDENT_AMBULATORY_CARE_PROVIDER_SITE_OTHER): Payer: Medicare Other | Admitting: *Deleted

## 2018-09-13 ENCOUNTER — Other Ambulatory Visit: Payer: Self-pay

## 2018-09-13 ENCOUNTER — Other Ambulatory Visit: Payer: Self-pay | Admitting: Family Medicine

## 2018-09-13 DIAGNOSIS — E538 Deficiency of other specified B group vitamins: Secondary | ICD-10-CM | POA: Diagnosis not present

## 2018-09-13 NOTE — Progress Notes (Signed)
Pt given Cyanocobalamin inj Tolerated well 

## 2018-09-18 ENCOUNTER — Other Ambulatory Visit: Payer: Self-pay | Admitting: Family Medicine

## 2018-09-20 MED ORDER — GLUCOSE BLOOD VI STRP: ORAL_STRIP | 3 refills | Status: DC

## 2018-09-20 NOTE — Addendum Note (Signed)
Addended by: Antonietta Barcelona D on: 09/20/2018 10:28 AM   Modules accepted: Orders

## 2018-10-02 ENCOUNTER — Other Ambulatory Visit: Payer: Self-pay | Admitting: Cardiology

## 2018-10-05 DIAGNOSIS — Z1159 Encounter for screening for other viral diseases: Secondary | ICD-10-CM | POA: Diagnosis not present

## 2018-10-10 ENCOUNTER — Other Ambulatory Visit: Payer: Self-pay

## 2018-10-11 ENCOUNTER — Ambulatory Visit (INDEPENDENT_AMBULATORY_CARE_PROVIDER_SITE_OTHER): Payer: Medicare Other | Admitting: *Deleted

## 2018-10-11 DIAGNOSIS — E538 Deficiency of other specified B group vitamins: Secondary | ICD-10-CM | POA: Diagnosis not present

## 2018-10-11 NOTE — Progress Notes (Signed)
Pt given cyanocobalamin inj Tolerated well 

## 2018-10-12 DIAGNOSIS — I4891 Unspecified atrial fibrillation: Secondary | ICD-10-CM | POA: Diagnosis not present

## 2018-10-12 DIAGNOSIS — I517 Cardiomegaly: Secondary | ICD-10-CM | POA: Diagnosis not present

## 2018-10-12 DIAGNOSIS — Z95818 Presence of other cardiac implants and grafts: Secondary | ICD-10-CM | POA: Diagnosis not present

## 2018-10-30 ENCOUNTER — Other Ambulatory Visit: Payer: Self-pay | Admitting: Family Medicine

## 2018-11-08 ENCOUNTER — Ambulatory Visit (INDEPENDENT_AMBULATORY_CARE_PROVIDER_SITE_OTHER): Payer: Medicare Other | Admitting: *Deleted

## 2018-11-08 ENCOUNTER — Other Ambulatory Visit: Payer: Self-pay

## 2018-11-08 ENCOUNTER — Ambulatory Visit: Payer: Medicare Other | Admitting: Family Medicine

## 2018-11-08 DIAGNOSIS — E538 Deficiency of other specified B group vitamins: Secondary | ICD-10-CM

## 2018-11-09 ENCOUNTER — Other Ambulatory Visit: Payer: Self-pay | Admitting: Family Medicine

## 2018-11-14 ENCOUNTER — Other Ambulatory Visit: Payer: Self-pay

## 2018-11-15 ENCOUNTER — Ambulatory Visit (INDEPENDENT_AMBULATORY_CARE_PROVIDER_SITE_OTHER): Payer: Medicare Other | Admitting: Family Medicine

## 2018-11-15 ENCOUNTER — Encounter: Payer: Self-pay | Admitting: Family Medicine

## 2018-11-15 VITALS — BP 141/61 | HR 53 | Temp 98.6°F | Ht 62.5 in | Wt 229.0 lb

## 2018-11-15 DIAGNOSIS — E1159 Type 2 diabetes mellitus with other circulatory complications: Secondary | ICD-10-CM

## 2018-11-15 DIAGNOSIS — B372 Candidiasis of skin and nail: Secondary | ICD-10-CM

## 2018-11-15 DIAGNOSIS — I152 Hypertension secondary to endocrine disorders: Secondary | ICD-10-CM

## 2018-11-15 DIAGNOSIS — I480 Paroxysmal atrial fibrillation: Secondary | ICD-10-CM | POA: Diagnosis not present

## 2018-11-15 DIAGNOSIS — E034 Atrophy of thyroid (acquired): Secondary | ICD-10-CM | POA: Diagnosis not present

## 2018-11-15 DIAGNOSIS — E785 Hyperlipidemia, unspecified: Secondary | ICD-10-CM | POA: Diagnosis not present

## 2018-11-15 DIAGNOSIS — E119 Type 2 diabetes mellitus without complications: Secondary | ICD-10-CM | POA: Diagnosis not present

## 2018-11-15 DIAGNOSIS — E1169 Type 2 diabetes mellitus with other specified complication: Secondary | ICD-10-CM | POA: Diagnosis not present

## 2018-11-15 DIAGNOSIS — I10 Essential (primary) hypertension: Secondary | ICD-10-CM | POA: Diagnosis not present

## 2018-11-15 LAB — BAYER DCA HB A1C WAIVED: HB A1C (BAYER DCA - WAIVED): 6.7 % (ref <7.0)

## 2018-11-15 LAB — HEMOGLOBIN, FINGERSTICK: Hemoglobin: 10.7 g/dL — ABNORMAL LOW (ref 11.1–15.9)

## 2018-11-15 MED ORDER — GLIPIZIDE 5 MG PO TABS: 5.0000 mg | ORAL_TABLET | Freq: Two times a day (BID) | ORAL | 3 refills | Status: DC

## 2018-11-15 MED ORDER — METOPROLOL SUCCINATE ER 100 MG PO TB24: 100.0000 mg | ORAL_TABLET | Freq: Every day | ORAL | 3 refills | Status: DC

## 2018-11-15 MED ORDER — ROPINIROLE HCL 0.5 MG PO TABS: 0.5000 mg | ORAL_TABLET | Freq: Every day | ORAL | 1 refills | Status: DC

## 2018-11-15 MED ORDER — METFORMIN HCL 1000 MG PO TABS: 1000.0000 mg | ORAL_TABLET | Freq: Two times a day (BID) | ORAL | 3 refills | Status: DC

## 2018-11-15 MED ORDER — KETOCONAZOLE 2 % EX CREA: 1.0000 "application " | TOPICAL_CREAM | Freq: Every day | CUTANEOUS | 0 refills | Status: DC

## 2018-11-15 NOTE — Progress Notes (Signed)
Subjective: CC: afib, DM2, HTN, HLD PCP: Janora Norlander, DO BHA:LPFXT Emily [name] is a 68 y.o. female presenting to clinic today for:  1. Afib/ anemia Hx watchman procedure. She reports compliance with the medications as prescribed by cardiology.  She has had one episode of atrial fibrillation since the watchman procedure but she converted without intervention.  She has been doing well since that time and continues to take aspirin and Plavix.  Denies any hematochezia, melena, abnormal vaginal bleeding.  Denies any lightheadedness, loss of consciousness, pallor, heart palpitation, chest pain or shortness of breath.  2. Type 2 Diabetes w/ hypertension and hyperlipidemia:  Patient reports that she overall has been doing well but does note that she started having quite a bit of genital itching with relation to the Jardiance.  She has discontinued it about 2 weeks ago but have been compliant with it for the last several months prior.  She is prescribed Metformin 1049m BID, Glipizide 5 BID, Cozaar 255m Metoprolol XL 10023m(also on Diltiazem and flecainide for afib and Lasix prn fluid), Lovastatin 40, Zetia.   Last eye exam: 06/29/2017 Last foot exam: UTD Last A1c: . Lab Results  Component Value Date   HGBA1C 9.3 (H) 06/21/2018   Nephropathy screen indicated?: on ACE-I Last flu, zoster and/or pneumovax:  Immunization History  Administered Date(s) Administered  . Influenza, High Dose Seasonal PF 03/24/2016, 03/22/2018  . Influenza, Seasonal, Injecte, Preservative Fre 06/05/2014, 07/02/2015  . Pneumococcal Conjugate-13 07/09/2015  . Pneumococcal Polysaccharide-23 01/30/2014  . Tdap 05/26/2014    3.  Hypothyroidism, acquired atrophy Patient reports compliance with Synthroid.  Denies any change in voice, difficulty swallowing, tremor, heart palpitations.  ROS: Per HPI  Allergies  Allergen Reactions  . Quinapril Hcl Cough  . Statins Other (See Comments)    Not all Statins but  some cause cough and pain in legs.  . Tape Other (See Comments)    Redness, please use "paper" tape   Past Medical History:  Diagnosis Date  . Anemia   . Arthritis   . Atrial fibrillation, rapid ([place]/29/2015  . Bilateral [place] artery disease ([place] . Diabetes mellitus without complication ([place] . Difficult intubation    states 'lady that did the sleep study told me I have the smallest airway she has ever seen in an adult"  . Dyslipidemia 06/29/2017  . Dysrhythmia   . Hypertension   . Hypothyroid   . Obesity   . Obstructive sleep apnea    on C Pap  . PAF (paroxysmal atrial fibrillation) ([place]  a. newly dx in 09/2013; on eliquis    Current Outpatient Medications:  .  aspirin EC 81 MG tablet, Take by mouth., Disp: , Rfl:  .  chlorthalidone (HYGROTON) 25 MG tablet, TAKE 1 TABLET BY MOUTH EVERY DAY, Disp: 90 tablet, Rfl: 2 .  CRANBERRY PO, Take 1 tablet by mouth 2 (two) times daily., Disp: , Rfl:  .  diltiazem (CARDIZEM CD) 180 MG 24 hr capsule, Take 1 capsule (180 mg total) by mouth daily., Disp: 90 capsule, Rfl: 3 .  diltiazem (CARDIZEM) 30 MG tablet, TAKE 1 TABLET BY MOUTH EVERY DAY (Patient taking differently: Take only when heart is out of rhythm), Disp: 90 tablet, Rfl: 1 .  ezetimibe (ZETIA) 10 MG tablet, Take 1 tablet (10 mg total) by mouth daily., Disp: 90 tablet, Rfl: 3 .  flecainide (TAMBOCOR) 150 MG tablet, Take 0.5 tablets (75 mg total) by mouth 2 (two) times  daily., Disp: 90 tablet, Rfl: 3 .  furosemide (LASIX) 20 MG tablet, Take 1 tablet (20 mg total) by mouth daily as needed for fluid., Disp: 90 tablet, Rfl: 0 .  glipiZIDE (GLUCOTROL) 5 MG tablet, TAKE 1 TABLET BY MOUTH TWICE A DAY, Disp: 180 tablet, Rfl: 0 .  levothyroxine (SYNTHROID, LEVOTHROID) 112 MCG tablet, TAKE 1 TABLET BY MOUTH EVERY OTHER DAY, ALTERNATING WITH 1 & 1/2 TABLETS EVERY OTHER DAY, Disp: 112 tablet, Rfl: 3 .  losartan (COZAAR) 25 MG tablet, Take 1 tablet (25 mg total) by mouth daily., Disp: 90  tablet, Rfl: 3 .  lovastatin (MEVACOR) 40 MG tablet, TAKE 1 TABLET BY MOUTH EVERY DAY IN THE EVENING, Disp: 90 tablet, Rfl: 3 .  metFORMIN (GLUCOPHAGE) 1000 MG tablet, Take 1 tablet (1,000 mg total) by mouth 2 (two) times daily with a meal., Disp: 180 tablet, Rfl: 3 .  metoprolol succinate (TOPROL-XL) 100 MG 24 hr tablet, TAKE 1 TABLET (100 MG TOTAL) BY MOUTH DAILY. TAKE WITH OR IMMEDIATELY FOLLOWING A MEAL., Disp: 90 tablet, Rfl: 0 .  oxyCODONE-acetaminophen (PERCOCET/ROXICET) 5-325 MG tablet, Take 0.5-1 tablets by mouth every 8 (eight) hours as needed for severe pain., Disp: 30 tablet, Rfl: 0 .  rOPINIRole (REQUIP) 0.5 MG tablet, TAKE 1 TABLET (0.5 MG TOTAL) BY MOUTH AT BEDTIME., Disp: 90 tablet, Rfl: 1 .  blood glucose meter kit and supplies, Dispense based on patient and insurance preference. Use up to four times daily as directed. E11.9, Disp: 1 each, Rfl: 0 .  clopidogrel (PLAVIX) 75 MG tablet, Take 75 mg by mouth daily., Disp: , Rfl:  .  empagliflozin (JARDIANCE) 10 MG TABS tablet, Take 10 mg by mouth daily. (Patient not taking: Reported on 11/15/2018), Disp: 30 tablet, Rfl: 11 .  glucose blood (ACCU-CHEK GUIDE) test strip, USE TO CHECK BLOOD GLUCOSE ONCE DAILY Dx E11.9, Disp: 100 each, Rfl: 3 .  Lancet Device MISC, Use to test sugar once daily E11.9, Disp: 100 each, Rfl: 12  Current Facility-Administered Medications:  .  cyanocobalamin ((VITAMIN B-12)) injection 1,000 mcg, 1,000 mcg, Intramuscular, Q30 days, Timmothy Euler, MD, 1,000 mcg at 11/08/18 1032 Social History   Socioeconomic History  . Marital status: Single    Spouse name: Not on file  . Number of children: Not on file  . Years of education: Not on file  . Highest education level: Not on file  Occupational History  . Not on file  Social Needs  . Financial resource strain: Not on file  . Food insecurity    Worry: Not on file    Inability: Not on file  . Transportation needs    Medical: No    Non-medical: No   Tobacco Use  . Smoking status: Never Smoker  . Smokeless tobacco: Never Used  Substance and Sexual Activity  . Alcohol use: No    Alcohol/week: 0.0 standard drinks  . Drug use: Never  . Sexual activity: Never  Lifestyle  . Physical activity    Days per week: 0 days    Minutes per session: 0 min  . Stress: Not at all  Relationships  . Social Herbalist on phone: Twice a week    Gets together: Twice a week    Attends religious service: More than 4 times per year    Active member of club or organization: No    Attends meetings of clubs or organizations: Never    Relationship status: Never married  . Intimate partner violence  Fear of current or ex partner: No    Emotionally abused: No    Physically abused: No    Forced sexual activity: No  Other Topics Concern  . Not on file  Social History Narrative  . Not on file   Family History  Problem Relation Age of Onset  . COPD Father   . Heart failure Father   . Heart disease Father   . Emphysema Father   . Arrhythmia Sister   . Arrhythmia Sister   . Arrhythmia Sister        had PPM also  . Cancer Sister     Objective: Office vital signs reviewed. BP (!) 141/61   Pulse (!) 53   Temp 98.6 F (37 C) (Oral)   Ht 5' 2.5" (1.588 m)   Wt 229 lb (103.9 kg)   BMI 41.22 kg/m   Physical Examination:  General: Awake, alert, well nourished, No acute distress HEENT: Normal; no neck masses.  No goiter.    Eyes: PERRLA, extraocular membranes intact, sclera white. No conjunctival pallor.    Throat: moist mucus membranes, no erythema Cardio: bradycardic w/ regular rhythm, S1S2 heard, 2/6 SEM appreciated at LSB Pulm: clear to auscultation bilaterally, no wheezes, rhonchi or rales; normal work of breathing on room air Extremities: warm, well perfused, No edema, cyanosis or clubbing; +2 pulses bilaterally Skin: Dry, intact, normal temperature Neuro: No tremor Assessment/ Plan: 68 y.o. female   1. Diabetes mellitus  without complication ([place]) Now under excellent control with A1c of 6.7 today.  I have encouraged her to continue the Jardiance as it is not only cardioprotective but has made great impact in reduction of her blood sugars.  Continue metformin and glipizide as prescribed.  I have given her a ketoconazole cream to use if needed see below. - Bayer DCA Hb A1c Waived - Basic Metabolic Panel  2. Candidal intertrigo Use PRN.  I encouraged keeping her genital area clean.  We will have to change medications if she has ongoing or worsening symptoms. - ketoconazole (NIZORAL) 2 % cream; Apply 1 application topically daily. x7-10 days (for skin yeast)  Dispense: 15 g; Refill: 0  3. Hyperlipidemia associated with type 2 diabetes mellitus ([place]) Continue statin  4. Hypertension associated with diabetes ([place]) Continue antihypertensives. - Basic Metabolic Panel  5. PAF (paroxysmal atrial fibrillation) (HCC) Sinus rhythm today.  Hemoglobin stable at 10.7. - Hemoglobin, fingerstick  6. Hypothyroidism due to acquired atrophy of thyroid Asymptomatic.  Check thyroid level - Thyroid Panel With TSH   Orders Placed This Encounter  Procedures  . Bayer DCA Hb A1c Waived  . Thyroid Panel With TSH  . Basic Metabolic Panel  . Hemoglobin, fingerstick   Meds ordered this encounter  Medications  . ketoconazole (NIZORAL) 2 % cream    Sig: Apply 1 application topically daily. x7-10 days (for skin yeast)    Dispense:  15 g    Refill:  0  . glipiZIDE (GLUCOTROL) 5 MG tablet    Sig: Take 1 tablet (5 mg total) by mouth 2 (two) times daily.    Dispense:  180 tablet    Refill:  3  . metFORMIN (GLUCOPHAGE) 1000 MG tablet    Sig: Take 1 tablet (1,000 mg total) by mouth 2 (two) times daily with a meal.    Dispense:  180 tablet    Refill:  3  . metoprolol succinate (TOPROL-XL) 100 MG 24 hr tablet    Sig: Take 1 tablet (100 mg total) by  mouth daily. Take with or immediately following a meal.    Dispense:  90 tablet     Refill:  3  . rOPINIRole (REQUIP) 0.5 MG tablet    Sig: Take 1 tablet (0.5 mg total) by mouth at bedtime.    Dispense:  90 tablet    Refill:  [place], [place] [phone]

## 2018-11-15 NOTE — Patient Instructions (Signed)
I have added ketoconazole for you to use on the areas in your groin that are itchy.  Again this is likely related to blood sugar and yeast.  You can uses up to 1 week if needed.  I would really like you to continue the Jardiance if possible as your blood sugar is finally under control.

## 2018-11-16 LAB — BASIC METABOLIC PANEL
BUN/Creatinine Ratio: 28 (ref 12–28)
BUN: 19 mg/dL (ref 8–27)
CO2: 26 mmol/L (ref 20–29)
Calcium: 9.3 mg/dL (ref 8.7–10.3)
Chloride: 99 mmol/L (ref 96–106)
Creatinine, Ser: 0.68 mg/dL (ref 0.57–1.00)
GFR calc Af Amer: 104 mL/min/{1.73_m2} (ref >59)
GFR calc non Af Amer: 90 mL/min/{1.73_m2} (ref >59)
Glucose: 226 mg/dL — ABNORMAL HIGH (ref 65–99)
Potassium: 3.9 mmol/L (ref 3.5–5.2)
Sodium: 137 mmol/L (ref 134–144)

## 2018-11-16 LAB — THYROID PANEL WITH TSH
Free Thyroxine Index: 2.3 (ref 1.2–4.9)
T3 Uptake Ratio: 27 % (ref 24–39)
T4, Total: 8.7 ug/dL (ref 4.5–12.0)
TSH: 2.05 u[IU]/mL (ref 0.450–4.500)

## 2018-11-29 DIAGNOSIS — E119 Type 2 diabetes mellitus without complications: Secondary | ICD-10-CM | POA: Diagnosis not present

## 2018-11-29 DIAGNOSIS — H43393 Other vitreous opacities, bilateral: Secondary | ICD-10-CM | POA: Diagnosis not present

## 2018-11-29 DIAGNOSIS — Z961 Presence of intraocular lens: Secondary | ICD-10-CM | POA: Diagnosis not present

## 2018-11-29 LAB — HM DIABETES EYE EXAM

## 2018-12-05 ENCOUNTER — Other Ambulatory Visit: Payer: Self-pay

## 2018-12-06 ENCOUNTER — Ambulatory Visit (INDEPENDENT_AMBULATORY_CARE_PROVIDER_SITE_OTHER): Payer: Medicare Other | Admitting: *Deleted

## 2018-12-06 DIAGNOSIS — E538 Deficiency of other specified B group vitamins: Secondary | ICD-10-CM

## 2018-12-06 NOTE — Progress Notes (Signed)
Pt given Cyanocobalamin inj Tolerated well 

## 2018-12-20 ENCOUNTER — Encounter: Payer: Self-pay | Admitting: *Deleted

## 2018-12-20 ENCOUNTER — Telehealth: Payer: Self-pay | Admitting: *Deleted

## 2018-12-20 DIAGNOSIS — Z006 Encounter for examination for normal comparison and control in clinical research program: Secondary | ICD-10-CM

## 2018-12-20 NOTE — Research (Signed)
Marland KitchenMarland KitchenMarland KitchenMarland KitchenMarland KitchenMarland KitchenMarland Kitchen   COORDINATE-Diabetes 3 Month Patient Questionnaire (Intervention) Site #:   382   Patient ID:       [id number]            3 MONTH QUESTIONNAIRE  Visit Date      12/26/2018 MM DD YYYY  Vital Status [x]   Patient Alive > Proceed to Visit Status []   Patient Dead > Complete Death Form only []   Unknown > Proceed to Visit Status  Visit Status Was the interview completed? []  No >IF NO, Select reason why [] Unable to locate                                    [] No Valid Contacts (patients or alternates) [] Multiple attempts to valid contacts []  Patient no longer cared for at study clinic []  Patient withdrew []  Other, specify:                                           >IF NO, Last date of contact: / /             MM      DD        YYYY    [x] Yes >IF YES, Select source of Interview: []   Proxy [x]   Patient     3 Month Patient Questionnaire (Intervention)  Instructions:   1. Prior to speaking with the patient either over the phone or in person, print off or have available the patient's current list of medications.      IF conducting follow-up visit over the phone - Instruct the patient to gather all their pill bottles. 2. For the purpose of the COORDINATE-Diabetes Study, please document whether the patient is currently taking each of the following medication classes. 3. DO NOT PROMPT DURING FOLLOW-UP VISIT: IF subject mentions reaction to one of the following medications, complete the AE/SAE section and follow instructions in operations manual regarding Adverse event reporting to [employer] (BI).  MEDICATIONS  Medication Are you currently taking [insert name of previous med]? If currently taking: If started since last visit: If stopped since last visit:  ACE Inhibitor / Angiotensin Receptor Blocker (ARB) / Angiotensin Receptor Neprilysin inhibitor (ARNi) [] No >  [x] Yes > [] Stopped since last visit [] Never prescribed [] Started since last visit [x] Same medication as last      visit [] Different medication than       last visit [Study personnel to answer]  Documented in EHR? [] No  [x] Yes   If no, Verified by: [] Photo of bottle [] Copy of rx [] Dispensing       pharmacy [] Prescribing provider [] Other (specify):                    Date started:        / /             MM DD YYYY Who prescribed this medication for you? [] Cardiology provider > [] Study clinic [] Outside clinic [] Endocrinology provider [] Primary care provider [] Other provider > Specify:                             [] Unknown Date discontinued:        / /             MM DD [place]  Why did you stop taking this medication? (check all that apply) [] Allergic reaction [] Medication side effects [] Unable to       adhere/monitor [] Had an      operation/procedure      that required      stopping it [] Unable to afford it [] No longer wants        to take        this medication [] Provider decision [] Pregnancy [] Other (specify: ) [] Unknown Reason   If started or changed  What medication are you taking now? [] Benazepril (Lotensin) [] Captopril (Capoten) [] Enalapril (Vasotec) [] Fosinopril (Monopril) [] Lisinopril (Zestril, Prinivil) [] Quinapril (Accupril) [] Ramipril (Altace) [] Azilsartan (Edarbi) [] Candesartan (Atacand) [] Irbesartan (Avapro) [] Losartan (Cozaar) [] Olmesartan (Benicar) [] Telmisartan (Micardis) [] Valsartan (Diovan) [] Sacrubitril/Valsartan Delene Loll)      Medication Are you currently taking [insert name of previous med]? If currently taking: If started since last visit: If stopped since last visit:  Statin []  No >  [] Yes > [] Stopped since last visit [] Never prescribed [] Started since last visit [x] Same medication as last        visit [] Different medication or dose     than last visit [Study personnel to answer]  Documented in EHR? [] No [x] Yes    If no, Verified by: [] Photo of bottle [] Copy of rx [] Dispensing pharmacy [] Prescribing  provider [] Other (specify):                    Date started:        / /             MM DD YYYY Who prescribed this medication for you? [] Cardiology provider  [] Study clinic [] Outside clinic [] Endocrinology provider [] Primary care provider [] Other provider  Specify:                             [] Unknown Date discontinued:        / /             MM DD YYYY Why did you stop taking this medication? (check all that apply) [] Allergic reaction [] Medication side effects  [] Muscle aches [] Weakness [] Joint pain [] Cognitive symptoms [] Other (specify):                          [] Unable to        adhere/monitor [] Had an    operation/procedure    that required stopping it [] Unable to afford it [] No longer wants       to take this            medication [] Provider decision [] Pregnancy [] Other (specify: ) [] Unknown Reason   If started or changed  What medication are you taking now? [] Atorvastatin (Lipitor) [] Fluvastatin (Lescol) [] Lovastatin (Mevacor) [] Pravastatin (Pravachol) [] Rosuvastatin (Crestor) [] Simvastatin (Zocor) [] Pitatavastatin (Livalo)  Dose: [] 1 mg  [] 10 mg [] 2 mg  []  20 mg [] 3 mg  []  40 mg [] 4 mg  []  60 mg [] 5 mg  []  80 mg  Frequency: [] Daily []  Less than daily      Medication Are you currently taking [insert name of previous med]? If currently taking: If started since last visit: If stopped since last visit:  SGLT2 Inhibitor [] No   [x] Yes [] Stopped since last visit [] Never prescribed [] Started since last visit [x] Same medication as last      visit [] Different medication than last visit [Study personnel to answer]  Documented in EHR? [] No [x] Yes   If no, Verified by: [] Photo of bottle [] Copy  of rx [] Dispensing       pharmacy [] Prescribing       provider [] Other (specify):                    Date started:        / /             MM DD YYYY Who prescribed this medication for you? [] Cardiology provider  [] Study clinic [] Outside  clinic [] Endocrinology provider [] Primary care provider [] Other provider  Specify:                             [] Unknown Date discontinued:        / /             MM DD YYYY Why did you stop taking this medication? (check all that apply) [] Allergic reaction [] Medication side effects [] Unable to       adhere/monitor [] Had an        operation/procedure         that required           stopping it [] Patient unable to       afford it [] Patient no longer       wants to       take this medication [] Provider decision [] Pregnancy [] Other (specify: ) [] Unknown Reason   If started or changed  What medication are you taking now? [] Canaglifozin (Invokana) [] Dapagliflozin (Farxiga) [] Empaglifozin (Jardiance) [] Ertugliflozin (Steglatro)     GLP1 Receptor Agonist [x] No   [] Yes [] Stopped since last visit [x] Never prescribed [] Started since last visit [] Same medication as last      visit [] Different medication than       last visit [Study personnel to answer]  Documented in EHR? [] No [] Yes   If no, Verified by: [] Photo of bottle [] Copy of rx [] Dispensing       pharmacy [] Prescribing       provider [] Other (specify):                    Date started:        / /             MM DD YYYY Who prescribed this medication for you? [] Cardiology provider  [] Study clinic [] Outside clinic [] Endocrinology provider [] Primary care provider [] Other provider  Specify:                             [] Unknown Date discontinued:        / /             MM DD YYYY Why did you stop taking this medication? (check all that apply) [] Allergic reaction [] Medication side effects [] Unable to      adhere/monitor [] Had an    operation/procedure    that required stopping it [] Patient unable to        afford it [] Patient no longer        wants to take this        medication [] Provider decision [] Pregnancy [] Other (specify: ) [] Unknown Reason   If started or changed  What medication  are you taking now? [] Albiglutide (Tanzeum) [] Dulaglutide (Trulicity) [] Exanatide (Byetta, Bydureon) [] Liraglutide (Victoza, Saxenda) [] Lixisenatide (Adlyxin) [] Semaglutice (Ozempic)     08/09/2018 (EDC Release)  pt doing good with the exception of having some vaginal itching from the [place]. Pt stated "I saw my doctor and  she gave me some cream for the itching and it has helped. I also was more frequently with dial soap to keep the bacteria down. My HB A1C is down to 6.7."Encouraged pt to call if she had any question . Will follow up with patient in three months

## 2018-12-20 NOTE — Telephone Encounter (Signed)
LVM to call about for Coordinate 3 month call.

## 2019-01-02 ENCOUNTER — Other Ambulatory Visit: Payer: Self-pay

## 2019-01-03 ENCOUNTER — Ambulatory Visit (INDEPENDENT_AMBULATORY_CARE_PROVIDER_SITE_OTHER): Payer: Medicare Other | Admitting: *Deleted

## 2019-01-03 DIAGNOSIS — E538 Deficiency of other specified B group vitamins: Secondary | ICD-10-CM

## 2019-01-03 NOTE — Progress Notes (Signed)
Pt given Cyanocobalamin inj Tolerated well 

## 2019-01-06 DIAGNOSIS — Z7982 Long term (current) use of aspirin: Secondary | ICD-10-CM | POA: Diagnosis not present

## 2019-01-06 DIAGNOSIS — I739 Peripheral vascular disease, unspecified: Secondary | ICD-10-CM | POA: Diagnosis not present

## 2019-01-06 DIAGNOSIS — K922 Gastrointestinal hemorrhage, unspecified: Secondary | ICD-10-CM | POA: Diagnosis not present

## 2019-01-06 DIAGNOSIS — E119 Type 2 diabetes mellitus without complications: Secondary | ICD-10-CM | POA: Diagnosis not present

## 2019-01-06 DIAGNOSIS — Z95818 Presence of other cardiac implants and grafts: Secondary | ICD-10-CM | POA: Diagnosis not present

## 2019-01-06 DIAGNOSIS — E039 Hypothyroidism, unspecified: Secondary | ICD-10-CM | POA: Diagnosis not present

## 2019-01-06 DIAGNOSIS — Z7984 Long term (current) use of oral hypoglycemic drugs: Secondary | ICD-10-CM | POA: Diagnosis not present

## 2019-01-06 DIAGNOSIS — I10 Essential (primary) hypertension: Secondary | ICD-10-CM | POA: Diagnosis not present

## 2019-01-06 DIAGNOSIS — I480 Paroxysmal atrial fibrillation: Secondary | ICD-10-CM | POA: Diagnosis not present

## 2019-01-06 DIAGNOSIS — Z7902 Long term (current) use of antithrombotics/antiplatelets: Secondary | ICD-10-CM | POA: Diagnosis not present

## 2019-01-06 DIAGNOSIS — I6529 Occlusion and stenosis of unspecified carotid artery: Secondary | ICD-10-CM | POA: Diagnosis not present

## 2019-01-06 DIAGNOSIS — E1159 Type 2 diabetes mellitus with other circulatory complications: Secondary | ICD-10-CM | POA: Diagnosis not present

## 2019-01-07 DIAGNOSIS — I517 Cardiomegaly: Secondary | ICD-10-CM | POA: Diagnosis not present

## 2019-01-07 DIAGNOSIS — R001 Bradycardia, unspecified: Secondary | ICD-10-CM | POA: Diagnosis not present

## 2019-01-27 ENCOUNTER — Ambulatory Visit (INDEPENDENT_AMBULATORY_CARE_PROVIDER_SITE_OTHER): Payer: Medicare Other | Admitting: Family Medicine

## 2019-01-27 ENCOUNTER — Other Ambulatory Visit: Payer: Self-pay

## 2019-01-27 DIAGNOSIS — E034 Atrophy of thyroid (acquired): Secondary | ICD-10-CM

## 2019-01-27 DIAGNOSIS — F5089 Other specified eating disorder: Secondary | ICD-10-CM | POA: Diagnosis not present

## 2019-01-27 DIAGNOSIS — E119 Type 2 diabetes mellitus without complications: Secondary | ICD-10-CM

## 2019-01-27 NOTE — Progress Notes (Signed)
Telephone visit  Subjective: CC: eating a lot of ice PCP: Janora Norlander, DO GYI:RSWNI Thresher is a 68 y.o. female calls for telephone consult today. Patient provides verbal consent for consult held via phone.  Location of patient: home Location of provider: Working remotely from home Others present for call: none  1.  Anemia Patient reports that she has been feeling poorly and feeling like she wants to eat a lot of ice lately.  She has history of atrial fibrillation and previously had GI bleed such that she became symptomatically anemic.  She is since discontinued her anticoagulant, as she had a surgical procedure for her atrial fibrillation.  She did have a run of A. fib last week and it resolved after Valsalva.  She has not had any recurrent symptoms.  Denies any hematochezia, melena, dizziness or falls.  No shortness of breath.   ROS: Per HPI  Allergies  Allergen Reactions  . Quinapril Hcl Cough  . Statins Other (See Comments)    Not all Statins but some cause cough and pain in legs.  . Tape Other (See Comments)    Redness, please use "paper" tape   Past Medical History:  Diagnosis Date  . Anemia   . Arthritis   . Atrial fibrillation, rapid ([place]) 10/27/2013  . Bilateral carotid artery disease ([place])   . Diabetes mellitus without complication ([place])   . Difficult intubation    states 'lady that did the sleep study told me I have the smallest airway she has ever seen in an adult"  . Dyslipidemia 06/29/2017  . Dysrhythmia   . Hypertension   . Hypothyroid   . Obesity   . Obstructive sleep apnea    on C Pap  . PAF (paroxysmal atrial fibrillation) ([place])    a. newly dx in 09/2013; on eliquis    Current Outpatient Medications:  .  aspirin EC 81 MG tablet, Take by mouth., Disp: , Rfl:  .  blood glucose meter kit and supplies, Dispense based on patient and insurance preference. Use up to four times daily as directed. E11.9, Disp: 1 each, Rfl: 0 .  chlorthalidone  (HYGROTON) 25 MG tablet, TAKE 1 TABLET BY MOUTH EVERY DAY, Disp: 90 tablet, Rfl: 2 .  clopidogrel (PLAVIX) 75 MG tablet, Take 75 mg by mouth daily., Disp: , Rfl:  .  CRANBERRY PO, Take 1 tablet by mouth 2 (two) times daily., Disp: , Rfl:  .  diltiazem (CARDIZEM CD) 180 MG 24 hr capsule, Take 1 capsule (180 mg total) by mouth daily., Disp: 90 capsule, Rfl: 3 .  diltiazem (CARDIZEM) 30 MG tablet, TAKE 1 TABLET BY MOUTH EVERY DAY (Patient taking differently: Take only when heart is out of rhythm), Disp: 90 tablet, Rfl: 1 .  empagliflozin (JARDIANCE) 10 MG TABS tablet, Take 10 mg by mouth daily. (Patient not taking: Reported on 11/15/2018), Disp: 30 tablet, Rfl: 11 .  ezetimibe (ZETIA) 10 MG tablet, Take 1 tablet (10 mg total) by mouth daily., Disp: 90 tablet, Rfl: 3 .  flecainide (TAMBOCOR) 150 MG tablet, Take 0.5 tablets (75 mg total) by mouth 2 (two) times daily., Disp: 90 tablet, Rfl: 3 .  furosemide (LASIX) 20 MG tablet, Take 1 tablet (20 mg total) by mouth daily as needed for fluid., Disp: 90 tablet, Rfl: 0 .  glipiZIDE (GLUCOTROL) 5 MG tablet, Take 1 tablet (5 mg total) by mouth 2 (two) times daily., Disp: 180 tablet, Rfl: 3 .  glucose blood (ACCU-CHEK GUIDE) test strip, USE TO  CHECK BLOOD GLUCOSE ONCE DAILY Dx E11.9, Disp: 100 each, Rfl: 3 .  ketoconazole (NIZORAL) 2 % cream, Apply 1 application topically daily. x7-10 days (for skin yeast), Disp: 15 g, Rfl: 0 .  Lancet Device MISC, Use to test sugar once daily E11.9, Disp: 100 each, Rfl: 12 .  levothyroxine (SYNTHROID, LEVOTHROID) 112 MCG tablet, TAKE 1 TABLET BY MOUTH EVERY OTHER DAY, ALTERNATING WITH 1 & 1/2 TABLETS EVERY OTHER DAY, Disp: 112 tablet, Rfl: 3 .  losartan (COZAAR) 25 MG tablet, Take 1 tablet (25 mg total) by mouth daily., Disp: 90 tablet, Rfl: 3 .  lovastatin (MEVACOR) 40 MG tablet, TAKE 1 TABLET BY MOUTH EVERY DAY IN THE EVENING, Disp: 90 tablet, Rfl: 3 .  metFORMIN (GLUCOPHAGE) 1000 MG tablet, Take 1 tablet (1,000 mg total) by  mouth 2 (two) times daily with a meal., Disp: 180 tablet, Rfl: 3 .  metoprolol succinate (TOPROL-XL) 100 MG 24 hr tablet, Take 1 tablet (100 mg total) by mouth daily. Take with or immediately following a meal., Disp: 90 tablet, Rfl: 3 .  oxyCODONE-acetaminophen (PERCOCET/ROXICET) 5-325 MG tablet, Take 0.5-1 tablets by mouth every 8 (eight) hours as needed for severe pain., Disp: 30 tablet, Rfl: 0 .  rOPINIRole (REQUIP) 0.5 MG tablet, Take 1 tablet (0.5 mg total) by mouth at bedtime., Disp: 90 tablet, Rfl: 1  Current Facility-Administered Medications:  .  cyanocobalamin ((VITAMIN B-12)) injection 1,000 mcg, 1,000 mcg, Intramuscular, Q30 days, Timmothy Euler, MD, 1,000 mcg at 01/03/19 1544  Assessment/ Plan: 68 y.o. female   1. Pathologic ice eating Possibly anemic again given constellation of symptoms.  Check hemoglobin.  Last was 10.7 and was stable from previous check 3 months prior.  She has history of blood loss anemia and therefore we will reevaluate.  She understands reasons for emergent evaluation emergency department.  Check other labs with Tuesdays blood draw.  We will follow-up on those results at her follow-up in September. - CBC; Future - Hemoglobin, fingerstick; Future - Basic Metabolic Panel; Future  2. Hypothyroidism due to acquired atrophy of thyroid - Thyroid Panel With TSH; Future  3. Diabetes mellitus without complication ([place]) - Bayer DCA Hb A1c Waived; Future - Basic Metabolic Panel; Future   Start time: 2:22pm End time: 2:27pm  Total time spent on patient care (including telephone call/ virtual visit): 10 minutes  [place], [place] [phone]

## 2019-01-30 ENCOUNTER — Other Ambulatory Visit: Payer: Self-pay

## 2019-01-30 ENCOUNTER — Telehealth: Payer: Self-pay | Admitting: Family Medicine

## 2019-01-31 ENCOUNTER — Ambulatory Visit (INDEPENDENT_AMBULATORY_CARE_PROVIDER_SITE_OTHER): Payer: Medicare Other | Admitting: *Deleted

## 2019-01-31 ENCOUNTER — Other Ambulatory Visit: Payer: Medicare Other

## 2019-01-31 DIAGNOSIS — E119 Type 2 diabetes mellitus without complications: Secondary | ICD-10-CM

## 2019-01-31 DIAGNOSIS — E034 Atrophy of thyroid (acquired): Secondary | ICD-10-CM | POA: Diagnosis not present

## 2019-01-31 DIAGNOSIS — E538 Deficiency of other specified B group vitamins: Secondary | ICD-10-CM

## 2019-01-31 DIAGNOSIS — F5089 Other specified eating disorder: Secondary | ICD-10-CM

## 2019-01-31 LAB — HEMOGLOBIN, FINGERSTICK: Hemoglobin: 11.3 g/dL (ref 11.1–15.9)

## 2019-01-31 NOTE — Progress Notes (Signed)
Pt given Cyanocobalamin inj Tolerated well 

## 2019-02-01 LAB — THYROID PANEL WITH TSH
Free Thyroxine Index: 2.8 (ref 1.2–4.9)
T3 Uptake Ratio: 29 % (ref 24–39)
T4, Total: 9.5 ug/dL (ref 4.5–12.0)
TSH: 2.39 u[IU]/mL (ref 0.450–4.500)

## 2019-02-01 LAB — BASIC METABOLIC PANEL
BUN/Creatinine Ratio: 31 — ABNORMAL HIGH (ref 12–28)
BUN: 23 mg/dL (ref 8–27)
CO2: 24 mmol/L (ref 20–29)
Calcium: 9.4 mg/dL (ref 8.7–10.3)
Chloride: 98 mmol/L (ref 96–106)
Creatinine, Ser: 0.75 mg/dL (ref 0.57–1.00)
GFR calc Af Amer: 94 mL/min/{1.73_m2} (ref >59)
GFR calc non Af Amer: 82 mL/min/{1.73_m2} (ref >59)
Glucose: 240 mg/dL — ABNORMAL HIGH (ref 65–99)
Potassium: 3.9 mmol/L (ref 3.5–5.2)
Sodium: 138 mmol/L (ref 134–144)

## 2019-02-01 LAB — CBC
Hematocrit: 36.3 % (ref 34.0–46.6)
Hemoglobin: 11.5 g/dL (ref 11.1–15.9)
MCH: 28.1 pg (ref 26.6–33.0)
MCHC: 31.7 g/dL (ref 31.5–35.7)
MCV: 89 fL (ref 79–97)
Platelets: 240 10*3/uL (ref 150–450)
RBC: 4.09 x10E6/uL (ref 3.77–5.28)
RDW: 15.2 % (ref 11.7–15.4)
WBC: 7.8 10*3/uL (ref 3.4–10.8)

## 2019-02-07 ENCOUNTER — Ambulatory Visit (INDEPENDENT_AMBULATORY_CARE_PROVIDER_SITE_OTHER): Payer: Medicare Other | Admitting: Urology

## 2019-02-07 DIAGNOSIS — N952 Postmenopausal atrophic vaginitis: Secondary | ICD-10-CM

## 2019-02-07 DIAGNOSIS — N3000 Acute cystitis without hematuria: Secondary | ICD-10-CM

## 2019-02-10 ENCOUNTER — Other Ambulatory Visit: Payer: Self-pay

## 2019-02-10 DIAGNOSIS — I6523 Occlusion and stenosis of bilateral carotid arteries: Secondary | ICD-10-CM

## 2019-02-14 ENCOUNTER — Ambulatory Visit (HOSPITAL_COMMUNITY)
Admission: RE | Admit: 2019-02-14 | Discharge: 2019-02-14 | Disposition: A | Discharge status: Home / Self Care | Payer: Medicare Other | Source: Ambulatory Visit | Attending: Family | Admitting: Family

## 2019-02-14 ENCOUNTER — Other Ambulatory Visit: Payer: Self-pay

## 2019-02-14 ENCOUNTER — Ambulatory Visit: Payer: Medicare Other | Admitting: Family

## 2019-02-14 DIAGNOSIS — I6523 Occlusion and stenosis of bilateral carotid arteries: Secondary | ICD-10-CM | POA: Diagnosis not present

## 2019-02-15 ENCOUNTER — Other Ambulatory Visit: Payer: Self-pay

## 2019-02-15 MED ORDER — LOSARTAN POTASSIUM 25 MG PO TABS: 25.0000 mg | ORAL_TABLET | Freq: Every day | ORAL | 2 refills | Status: DC

## 2019-02-16 ENCOUNTER — Ambulatory Visit (INDEPENDENT_AMBULATORY_CARE_PROVIDER_SITE_OTHER): Payer: Medicare Other | Admitting: Family

## 2019-02-16 ENCOUNTER — Encounter: Payer: Self-pay | Admitting: Family

## 2019-02-16 ENCOUNTER — Other Ambulatory Visit: Payer: Self-pay

## 2019-02-16 DIAGNOSIS — I6523 Occlusion and stenosis of bilateral carotid arteries: Secondary | ICD-10-CM | POA: Diagnosis not present

## 2019-02-16 DIAGNOSIS — Z9889 Other specified postprocedural states: Secondary | ICD-10-CM

## 2019-02-16 NOTE — Patient Instructions (Signed)

## 2019-02-16 NOTE — Progress Notes (Signed)
Virtual Visit via Telephone Note   I connected with Emily [name] on 02/16/2019 using the [employer]me by telephone and verified that I was speaking with the correct person using two identifiers. Patient was located at her home and accompanied by herself. I am located at the [facility] office/clinic.   The limitations of evaluation and management by telemedicine and the availability of in person appointments have been previously discussed with the patient and are documented in the patients chart. The patient expressed understanding and consented to proceed.  PCP: Emily Norlander, DO  Chief Complaint: Follow up Extracranial Carotid Artery Stenosis   History of Present Illness: Emily [name] is a 68 y.o. female who is status post left carotid endarterectomy with bovine pericardial patch angioplasty on 03/01/2017by Dr. Trula Slade. This was done for asymptomatic bilateral carotid stenosis, left greater than right. Intraoperative findings included a 85% stenosis. Her postoperative follow-up was uncomplicated and she was discharged to home the following day.She stated that her left middle finger was a little numb after the procedure. She was a very difficult A-line.   She reports no new neurologic symptoms. She has had pain in her leftleg which has been attributed toback issues.  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   Dr. Trula Slade last evaluated pt on 08-03-16. At that timeright extracranial carotid arterystenosis remainedin the 60-79% category. The left endarterectomy sitewas widely patent.The patient remainedneurologically intact. We will continue with duplex ultrasound surveillance in 6 months.  Shedenies any known history of stroke or TIA. Specifically she deniesa history of amaurosis fugax or monocular blindness, unilateral facial drooping, hemiplegia, orreceptive or expressive aphasia.  She denies claudication type symptoms with walkingwhen her iron levels are closer  to normal, otherwise she feels generalized fatigue and weakness.  She states the use of her C-PAP has helped control her atrial fib.  She is getting iron infusions managed by her hematologist. 2 bleeding polyps found in her stomach and had this addressed by GI in [place]; but states she still is anemic and requires iron infusions.  She had a Watchman procedure in February 2020 at [facility]. Her cardiologist is Dr. Percival Spanish [facility]. She had aTEE, then Eliquis was stopped, and she takes Plavix and ASA 81 mg.    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 She had a watchman LAA occluder device placed 07/15/2018; is being followed by [facility] for this  Past Medical History:  Diagnosis Date  . Anemia   . Arthritis   . Atrial fibrillation, rapid ([place]) 10/27/2013  . Bilateral carotid artery disease ([place])   . Diabetes mellitus without complication ([place])   . Difficult intubation    states 'lady that did the sleep study told me I have the smallest airway she has ever seen in an adult"  . Dyslipidemia 06/29/2017  . Dysrhythmia   . Hypertension   . Hypothyroid   . Obesity   . Obstructive sleep apnea    on C Pap  . PAF (paroxysmal atrial fibrillation) ([place])    a. newly dx in 09/2013; on eliquis    Past Surgical History:  Procedure Laterality Date  . CATARACT EXTRACTION Left   .  CATARACT EXTRACTION W/PHACO Right 12/09/2015   Procedure: CATARACT EXTRACTION PHACO AND INTRAOCULAR LENS PLACEMENT (IOC);  Surgeon: Tonny Branch, MD;  Location: [facility];  Service: Ophthalmology;  Laterality: Right;  CDE:10.48  . COLONOSCOPY W/ POLYPECTOMY    . cyst  on back of neck removed    . DILATION AND CURETTAGE OF UTERUS     x 5  . ENDARTERECTOMY Left 08/30/2015   Procedure: LEFT CAROYID ENDARTERECTOMY WITH XENOSURE BOVINE PERICARDIUM PATCH ANGIOPLASTY;  Surgeon: [name], MD;  Location: [facility];  Service: Vascular;  Laterality: Left;  . EYE SURGERY    . TONSILLECTOMY    . UPPER GI ENDOSCOPY      Current Meds  Medication Sig  . aspirin EC 81 MG tablet Take by mouth.  . blood glucose meter kit and supplies Dispense based on patient and insurance preference. Use up to four times daily as directed. E11.9  . chlorthalidone (HYGROTON) 25 MG tablet TAKE 1 TABLET BY MOUTH EVERY DAY  . clopidogrel (PLAVIX) 75 MG tablet Take 75 mg by mouth daily.  Marland Kitchen CRANBERRY PO Take 1 tablet by mouth 2 (two) times daily.  Marland Kitchen diltiazem (CARDIZEM) 30 MG tablet TAKE 1 TABLET BY MOUTH EVERY DAY (Patient taking differently: Take only when heart is out of rhythm)  . empagliflozin (JARDIANCE) 10 MG TABS tablet Take 10 mg by mouth daily.  Marland Kitchen ezetimibe (ZETIA) 10 MG tablet Take 1 tablet (10 mg total) by mouth daily.  . flecainide (TAMBOCOR) 150 MG tablet Take 0.5 tablets (75 mg total) by mouth 2 (two) times daily.  . furosemide (LASIX) 20 MG tablet Take 1 tablet (20 mg total) by mouth daily as needed for fluid.  Marland Kitchen glipiZIDE (GLUCOTROL) 5 MG tablet Take 1 tablet (5 mg total) by mouth 2 (two) times daily.  Marland Kitchen glucose blood (ACCU-CHEK GUIDE) test strip USE TO CHECK BLOOD GLUCOSE ONCE DAILY Dx E11.9  . ketoconazole (NIZORAL) 2 % cream Apply 1 application topically daily. x7-10 days (for skin yeast)  . Lancet Device MISC Use to test sugar once daily E11.9  . levothyroxine (SYNTHROID, LEVOTHROID) 112 MCG tablet TAKE 1 TABLET BY MOUTH EVERY OTHER DAY, ALTERNATING WITH 1 & 1/2 TABLETS EVERY OTHER DAY  . losartan (COZAAR) 25 MG tablet Take 1 tablet (25 mg total) by mouth daily.  Marland Kitchen lovastatin (MEVACOR) 40 MG tablet TAKE 1 TABLET BY MOUTH EVERY DAY IN THE EVENING  . metFORMIN  (GLUCOPHAGE) 1000 MG tablet Take 1 tablet (1,000 mg total) by mouth 2 (two) times daily with a meal.  . metoprolol succinate (TOPROL-XL) 100 MG 24 hr tablet Take 1 tablet (100 mg total) by mouth daily. Take with or immediately following a meal.  . oxyCODONE-acetaminophen (PERCOCET/ROXICET) 5-325 MG tablet Take 0.5-1 tablets by mouth every 8 (eight) hours as needed for severe pain.  Marland Kitchen rOPINIRole (REQUIP) 0.5 MG tablet Take 1 tablet (0.5 mg total) by mouth at bedtime.   Current Facility-Administered Medications for the 02/16/19 encounter (Office Visit) with , Sharmon Leyden, NP  Medication  . cyanocobalamin ((VITAMIN B-12)) injection 1,000 mcg    12 system ROS was negative unless otherwise noted in HPI   Observations/Objective:   Assessment and Plan: Emily [name] is a 67 y.o. female whois status post left carotid endarterectomy with bovine pericardial patch angioplasty on 07/31/2015. She has no history of stroke or TIA.   Carotid duplex on 02-14-19 shows 60-79% stenosis in the right ICA, and 1-39% stenosis in the left ICA (CEA site). No change  compared to the previous exam on 08-15-18.   Her atherosclerotic risk factors includeuncontrolledDM, PAF (has a Watchman device, takes Plavix and ASA), OSA (uses CPAP), and morbid obesity.  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   DATA  Carotid Duplex (02-14-19): Right Carotid: Velocities in the right ICA are consistent with a 60-79% stenosis. Left Carotid: Velocities in the left ICA are consistent with a 1-39% stenosis. Vertebrals:  Bilateral vertebral arteries demonstrate antegrade flow. Subclavians: Normal flow hemodynamics were seen in bilateral subclavian arteries. No change compared to the exam on 08-15-18.     Follow Up Instructions:   Follow up in 6 months with carotid duplex.   I discussed the assessment and treatment plan with the patient. The patient was  provided an opportunity to ask questions and all were answered. The patient agreed with the plan and demonstrated an understanding of the instructions.   The patient was advised to call back or seek an in-person evaluation if the symptoms worsen or if the condition fails to improve as anticipated.  I spent 12 minutes with the patient via telephone encounter.   Gabrielle Dare  [facility] Office: [phone]  02/16/2019, 11:54 AM

## 2019-02-21 ENCOUNTER — Other Ambulatory Visit: Payer: Self-pay | Admitting: Cardiology

## 2019-02-21 NOTE — Telephone Encounter (Signed)
Follow Up:    Pt is calling to see if her medicine have been called in. She says she need it today please. She said to please let her know when you call this in please.

## 2019-02-21 NOTE — Telephone Encounter (Signed)
 *  STAT* If patient is at the pharmacy, call can be transferred to refill team.   1. Which medications need to be refilled? (please list name of each medication and dose if known) chlorthalidone (HYGROTON) 25 MG tablet  2. Which pharmacy/location (including street and city if local pharmacy) is medication to be sent to? [facility]  3. Do they need a 30 day or 90 day supply? [place]

## 2019-02-21 NOTE — Telephone Encounter (Signed)
Follow Up  Patient is calling back in about getting medication refilled. Patient states that she is out of the medication and needs to be able to get it refilled today. Please assist.

## 2019-02-22 MED ORDER — CHLORTHALIDONE 25 MG PO TABS: 25.0000 mg | ORAL_TABLET | Freq: Every day | ORAL | 1 refills | Status: DC

## 2019-02-22 NOTE — Telephone Encounter (Signed)
Rx(s) sent to pharmacy electronically.  

## 2019-02-27 ENCOUNTER — Other Ambulatory Visit: Payer: Self-pay

## 2019-02-28 ENCOUNTER — Ambulatory Visit (INDEPENDENT_AMBULATORY_CARE_PROVIDER_SITE_OTHER): Payer: Medicare Other | Admitting: Family Medicine

## 2019-02-28 ENCOUNTER — Encounter: Payer: Self-pay | Admitting: Family Medicine

## 2019-02-28 VITALS — BP 121/55 | HR 53 | Temp 98.7°F | Ht 62.0 in | Wt 225.0 lb

## 2019-02-28 DIAGNOSIS — E785 Hyperlipidemia, unspecified: Secondary | ICD-10-CM | POA: Diagnosis not present

## 2019-02-28 DIAGNOSIS — E119 Type 2 diabetes mellitus without complications: Secondary | ICD-10-CM | POA: Diagnosis not present

## 2019-02-28 DIAGNOSIS — E1159 Type 2 diabetes mellitus with other circulatory complications: Secondary | ICD-10-CM

## 2019-02-28 DIAGNOSIS — E1169 Type 2 diabetes mellitus with other specified complication: Secondary | ICD-10-CM | POA: Diagnosis not present

## 2019-02-28 DIAGNOSIS — Z23 Encounter for immunization: Secondary | ICD-10-CM

## 2019-02-28 DIAGNOSIS — I10 Essential (primary) hypertension: Secondary | ICD-10-CM

## 2019-02-28 DIAGNOSIS — E538 Deficiency of other specified B group vitamins: Secondary | ICD-10-CM | POA: Diagnosis not present

## 2019-02-28 LAB — BAYER DCA HB A1C WAIVED: HB A1C (BAYER DCA - WAIVED): 7.7 % — ABNORMAL HIGH (ref <7.0)

## 2019-02-28 MED ORDER — JARDIANCE 25 MG PO TABS: 25.0000 mg | ORAL_TABLET | Freq: Every day | ORAL | 11 refills | Status: DC

## 2019-02-28 NOTE — Patient Instructions (Signed)
Your sugar is high again.  I have increased the Jardiance dose to 25 mg daily.  You can take 2 of your 10mg  tablets until your finish your current supply.    We talked about a barrier cream (desitin or Boudreau's Butt paste) after going to the bathroom each time to see if this will help your symptoms.  If not, we can consider an alternative medication.  We will recheck in 3 months.

## 2019-02-28 NOTE — Addendum Note (Signed)
Addended by: Michaela Corner on: 02/28/2019 03:32 PM   Modules accepted: Orders

## 2019-02-28 NOTE — Progress Notes (Signed)
Subjective: CC: DM2, HTN, HLD PCP: Janora Norlander, DO CXK:GYJEH Thain is a 68 y.o. female presenting to clinic today for:  1. Type 2 Diabetes w/ hypertension and hyperlipidemia:  Patient reports that she continues to have some genital itching with Jardiance.  She will be starting a probiotic soon to help with this.   She is prescribed Metformin 1058m BID, Glipizide 5 BID, jardiance 161m Cozaar 2563mMetoprolol XL 100m31malso on Diltiazem and flecainide for afib and Lasix prn fluid), Lovastatin 40, Zetia.  She admits that she has not been eating well since returning to work.  She is also not walking regularly as she had been when her sugar was well controlled.  No CP, SOB, dizziness, falls.  Last eye exam: 11/29/2018 Last foot exam: UTD Last A1c: . Lab Results  Component Value Date   HGBA1C 6.7 11/15/2018   Nephropathy screen indicated?: on ACE-I Last flu, zoster and/or pneumovax: Flu, PNA Immunization History  Administered Date(s) Administered  . Influenza, High Dose Seasonal PF 03/24/2016, 03/22/2018  . Influenza, Seasonal, Injecte, Preservative Fre 06/05/2014, 07/02/2015  . Pneumococcal Conjugate-13 07/09/2015  . Pneumococcal Polysaccharide-23 01/30/2014  . Tdap 05/26/2014   ROS: Per HPI  Allergies  Allergen Reactions  . Quinapril Hcl Cough  . Statins Other (See Comments)    Not all Statins but some cause cough and pain in legs.  . Tape Other (See Comments)    Redness, please use "paper" tape   Past Medical History:  Diagnosis Date  . Anemia   . Arthritis   . Atrial fibrillation, rapid (HCC)[place]29/2015  . Bilateral carotid artery disease (HCC)[place]. Diabetes mellitus without complication (HCC)[place]. Difficult intubation    states 'lady that did the sleep study told me I have the smallest airway she has ever seen in an adult"  . Dyslipidemia 06/29/2017  . Dysrhythmia   . Hypertension   . Hypothyroid   . Obesity   . Obstructive sleep apnea    on C Pap  .  PAF (paroxysmal atrial fibrillation) (HCC)[place] a. newly dx in 09/2013; on eliquis    Current Outpatient Medications:  .  aspirin EC 81 MG tablet, Take by mouth., Disp: , Rfl:  .  blood glucose meter kit and supplies, Dispense based on patient and insurance preference. Use up to four times daily as directed. E11.9, Disp: 1 each, Rfl: 0 .  chlorthalidone (HYGROTON) 25 MG tablet, Take 1 tablet (25 mg total) by mouth daily., Disp: 90 tablet, Rfl: 1 .  clopidogrel (PLAVIX) 75 MG tablet, Take 75 mg by mouth daily., Disp: , Rfl:  .  CRANBERRY PO, Take 1 tablet by mouth 2 (two) times daily., Disp: , Rfl:  .  diltiazem (CARDIZEM CD) 180 MG 24 hr capsule, Take 1 capsule (180 mg total) by mouth daily., Disp: 90 capsule, Rfl: 3 .  diltiazem (CARDIZEM) 30 MG tablet, TAKE 1 TABLET BY MOUTH EVERY DAY (Patient taking differently: Take only when heart is out of rhythm), Disp: 90 tablet, Rfl: 1 .  empagliflozin (JARDIANCE) 10 MG TABS tablet, Take 10 mg by mouth daily., Disp: 30 tablet, Rfl: 11 .  ezetimibe (ZETIA) 10 MG tablet, Take 1 tablet (10 mg total) by mouth daily., Disp: 90 tablet, Rfl: 3 .  flecainide (TAMBOCOR) 150 MG tablet, Take 0.5 tablets (75 mg total) by mouth 2 (two) times daily., Disp: 90 tablet, Rfl: 3 .  furosemide (LASIX) 20 MG tablet, Take 1 tablet (20  mg total) by mouth daily as needed for fluid., Disp: 90 tablet, Rfl: 0 .  glipiZIDE (GLUCOTROL) 5 MG tablet, Take 1 tablet (5 mg total) by mouth 2 (two) times daily., Disp: 180 tablet, Rfl: 3 .  glucose blood (ACCU-CHEK GUIDE) test strip, USE TO CHECK BLOOD GLUCOSE ONCE DAILY Dx E11.9, Disp: 100 each, Rfl: 3 .  ketoconazole (NIZORAL) 2 % cream, Apply 1 application topically daily. x7-10 days (for skin yeast), Disp: 15 g, Rfl: 0 .  Lancet Device MISC, Use to test sugar once daily E11.9, Disp: 100 each, Rfl: 12 .  levothyroxine (SYNTHROID, LEVOTHROID) 112 MCG tablet, TAKE 1 TABLET BY MOUTH EVERY OTHER DAY, ALTERNATING WITH 1 & 1/2 TABLETS EVERY OTHER  DAY, Disp: 112 tablet, Rfl: 3 .  losartan (COZAAR) 25 MG tablet, Take 1 tablet (25 mg total) by mouth daily., Disp: 90 tablet, Rfl: 2 .  lovastatin (MEVACOR) 40 MG tablet, TAKE 1 TABLET BY MOUTH EVERY DAY IN THE EVENING, Disp: 90 tablet, Rfl: 3 .  metFORMIN (GLUCOPHAGE) 1000 MG tablet, Take 1 tablet (1,000 mg total) by mouth 2 (two) times daily with a meal., Disp: 180 tablet, Rfl: 3 .  metoprolol succinate (TOPROL-XL) 100 MG 24 hr tablet, Take 1 tablet (100 mg total) by mouth daily. Take with or immediately following a meal., Disp: 90 tablet, Rfl: 3 .  oxyCODONE-acetaminophen (PERCOCET/ROXICET) 5-325 MG tablet, Take 0.5-1 tablets by mouth every 8 (eight) hours as needed for severe pain., Disp: 30 tablet, Rfl: 0 .  rOPINIRole (REQUIP) 0.5 MG tablet, Take 1 tablet (0.5 mg total) by mouth at bedtime., Disp: 90 tablet, Rfl: 1  Current Facility-Administered Medications:  .  cyanocobalamin ((VITAMIN B-12)) injection 1,000 mcg, 1,000 mcg, Intramuscular, Q30 days, Timmothy Euler, MD, 1,000 mcg at 01/31/19 1542 Social History   Socioeconomic History  . Marital status: Single    Spouse name: Not on file  . Number of children: Not on file  . Years of education: Not on file  . Highest education level: Not on file  Occupational History  . Not on file  Social Needs  . Financial resource strain: Not on file  . Food insecurity    Worry: Not on file    Inability: Not on file  . Transportation needs    Medical: No    Non-medical: No  Tobacco Use  . Smoking status: Never Smoker  . Smokeless tobacco: Never Used  Substance and Sexual Activity  . Alcohol use: No    Alcohol/week: 0.0 standard drinks  . Drug use: Never  . Sexual activity: Never  Lifestyle  . Physical activity    Days per week: 0 days    Minutes per session: 0 min  . Stress: Not at all  Relationships  . Social Herbalist on phone: Twice a week    Gets together: Twice a week    Attends religious service: More than 4  times per year    Active member of club or organization: No    Attends meetings of clubs or organizations: Never    Relationship status: Never married  . Intimate partner violence    Fear of current or ex partner: No    Emotionally abused: No    Physically abused: No    Forced sexual activity: No  Other Topics Concern  . Not on file  Social History Narrative  . Not on file   Family History  Problem Relation Age of Onset  . COPD Father   .  Heart failure Father   . Heart disease Father   . Emphysema Father   . Arrhythmia Sister   . Arrhythmia Sister   . Arrhythmia Sister        had PPM also  . Cancer Sister     Objective: Office vital signs reviewed. BP (!) 121/55   Pulse (!) 53   Temp 98.7 F (37.1 C) (Temporal)   Ht 5' 2"  (1.575 m)   Wt 225 lb (102.1 kg)   SpO2 97%   BMI 41.15 kg/m   Physical Examination:  General: Awake, alert, well nourished, No acute distress Cardio: bradycardic w/ regular rhythm, S1S2 heard, 2/6 SEM appreciated at bilateral SBs Pulm: clear to auscultation bilaterally, no wheezes, rhonchi or rales; normal work of breathing on room air Extremities: warm, well perfused, No edema, cyanosis or clubbing; +2 pulses bilaterally  Assessment/ Plan: 68 y.o. female   1. Diabetes mellitus without complication ([place]) Not controlled.  A1c has risen from 6.7 to 7.7.  This is likely reflective of current lifestyle.  I have recommended increasing Jardiance to 25 mg daily.  We discussed use of barrier cream for genital protection.  She has nystatin cream and ketoconazole cream on hand if needed.  We discussed that if she has no improvement with genital itching and/or worsening, we need to consider alternative therapies.  We could consider an injectable like Ozempic or Trulicity instead.  She will follow-up if needed on this issue.  Otherwise, follow-up in 3 months for recheck.  Pneumococcal vaccine and influenza vaccine administered today. - Bayer DCA Hb A1c Waived  - empagliflozin (JARDIANCE) 25 MG TABS tablet; Take 25 mg by mouth daily before breakfast.  Dispense: 30 tablet; Refill: 11  2. Hyperlipidemia associated with type 2 diabetes mellitus ([place]) Continue statin  3. Hypertension associated with diabetes ([place]) Controlled.  Continue current regimen.    Orders Placed This Encounter  Procedures  . Bayer DCA Hb A1c Waived    Standing Status:   Future    Number of Occurrences:   1    Standing Expiration Date:   02/28/2020   No orders of the defined types were placed in this encounter.    Janora Norlander, DO [place] [phone]

## 2019-03-07 ENCOUNTER — Ambulatory Visit: Payer: Medicare Other

## 2019-03-21 ENCOUNTER — Encounter: Payer: Self-pay | Admitting: *Deleted

## 2019-03-21 DIAGNOSIS — Z006 Encounter for examination for normal comparison and control in clinical research program: Secondary | ICD-10-CM

## 2019-03-21 NOTE — Research (Signed)
COORDINATE-Diabetes 6 Month CASE REPORT FORM (Intervention) -EHR Site #:   [id number]              Patient ID:           [id number]     6 MONTH EHR REVIEW  Medical Record Check Date 03/21/19   Vital Status [x]Patient Alive >Date last known alive per EHR:   []Patient Dead >> Complete Death Form  []Unknown   CLINICAL EVENTS / PROCEDURES  Hospitalization since last visit? (>=24 hour stay) [x]No []Yes  >> if yes, Complete the following  Date of hospital admission:        / /             MM DD YYYY  Primary discharge diagnosis:  *Complete appropriate event validation form []acute myocardial infarction (heart attack)* []stroke* []heart failure* []coronary revascularization* []peripheral revascularization* []cerebral revascularization* []diabetes (e.g. hypoglycemia, DKA) []renal failure []amputation []other cardiovascular reason []other NON-cardiovascular reason []unknown  Other diagnoses not documented above: (check all that apply)  *Complete appropriate event validation form []acute myocardial infarction (heart attack)* []stroke* []heart failure* []coronary revascularization* []peripheral revascularization* []cerebral revascularization* []diabetes (e.g. hypoglycemia, DKA) []renal failure []amputation []other cardiovascular reason []other NON-cardiovascular reason []unknown  Were any of the following outpatient procedures done since the last visit? (I.e. procedures not captured above)  Coronary revascularization [x]No []Yes >IF YES, Date / /             MM DD YYYY  Peripheral revascularization [x]No []Yes > IF YES, Date / /             MM DD YYYY  Cerebral revascularization [x]No []Yes > IF YES, Date / /             MM DD YYYY  Extremity amputation    [x]No []Yes >IF YES, Date / /             MM      DD       YYYY  Renal replacement therapy (i.e. dialysis)    [x]No []Yes > IF YES, Date of initiation / /             MM      DD       YYYY   **EDC will allow for collection  of multiple hospitalizations and procedures   MEDICATIONS  Medication Currently Prescribed? Losartan 37m Daily  If started since last visit: If not started since last visit: If stopped since last visit:  Cardiac Medications  ACE Inhibitor / Angiotensin Receptor Blocker (ARB) / Angiotensin Receptor Neprilysin inhibitor (ARNi)  []No >  [x]Yes > Since last visit, medication was: []Stopped []Not started []Started [x]Continued same medication []Continued with medication changes Date started:        / /             MM DD YYYY Who prescribed? []Cardiology provider  []Study clinic []Outside clinic []Endocrinology provider []Primary care provider []Other provider  Specify:                             []Unknown Reason (check all that apply): []History of swelling around lips, eyes or face []Feeling dizzy/lightheaded []Low blood pressure []Poor or fluctuating kidney function []High potassium []Patient has experienced other side effects to this medication  before []Patient will be unable to adhere/monitor []Patient unable to afford it []Patient does not want to      take this medication []  Pregnancy []Other (specify: ) []Unknown Reason Date discontinued:        / /             MM DD YYYY Reason (check all that apply): []Swelling around lips, eyes       or face []Feeling dizzy/lightheaded []Low blood pressure []Poor or fluctuating kidney function []High potassium []Other medication side       effects []Patient unable to        adhere/monitor []Had an       operation/procedure that        required stopping it []Patient unable to afford it []Patient no longer wants       to take this medication []Pregnancy []Other (specify: ) []Unknown Reason   If started or changed  Medication Name: []Benazepril (Lotensin) []Captopril (Capoten) []Enalapril (Vasotec) []Fosinopril (Monopril) []Lisinopril (Zestril, Prinivil) []Quinapril (Accupril) []Ramipril  (Altace) []Azilsartan Earnest Rosier) []Candesartan (Atacand) []Irbesartan (Avapro) []Losartan (Cozaar) []Olmesartan (Benicar) []Telmisartan (Micardis) []Valsartan (Diovan) []Sacrubitril/Valsartan (Entresto)     Beta Blocker []No [x] Yes > []Acebutolol (Sectral) []Bisoprolol (Zebeta) []Carvedilol (Coreg) []Labetalol (Trandate,      Normodyne) [x]Metoprolol succinate (Toprol) []Metoprolol tartrate       (Lopressor) []Nadolol (Corgard) []Nebivolol (Bystolic) []Propranolol (Inderal) []Sotalol (Betapace)     Medication Currently Prescribed? If started since last visit: If not started since last visit: If stopped since last visit:  Aldosterone Antagonist [x]No []Yes > []Amiloride []Eplerenone (Inspra) []Spirinolactone (Aldactone) []Traimterene (Dyrenium)   Calcium Channel Blocker []No [x] Yes > Medication Name: []Amlodipine (Norvasc) [x]Diltiazem (Cardizem) []Felodipine (Plendil) []Nifedipine (Procardia) []Verapamil (Calan)   Diuretic Loop []No [x] Yes > Medication Name: []Bumetanide (Bumex) []Ethacrynic acid (Edecrin) [x]Furosemide (Lasix) []Torsemide (Demadex)   Diuretic Thiazide- type [x]No [] Yes > Medication Name: []Chlorothiazide      []Chlorthalidone []Hydrochlorothiazide []Indapamide []Metolazone   Anticoagulation Therapy (other than Warfarin) [x]No [] Yes > Medication Name: []Apixaban (Eliquis) []Edoxaban Girtha Rm) []Rivaroxaban Alen Blew) []Dabigatran (Redaxa)   Warfarin [x]No [] Yes    Antiplatelet Agent (including aspirin) []No [] Yes > Medication Name (check all that apply): [x]Aspirin []Clopidogrel (Plavix) []Prasugrel (Effient) []Ticagrelor (Brilinta) []Ticlopidine (Ticlid) []Dipyridamole (Persantine)    Medication Currently Prescribed? Lovastatin 40 mg Daily  If started since last visit: If not started since last visit: If stopped since last visit:  Statin  []No >  [x]Yes > Since last visit, medication  was: []Stopped []Not started []Started [x]Continued same medication and dose []Continued with dose or medication changes Date started:        / /             MM DD YYYY Who prescribed? []Cardiology provider []Endocrinology provider []Primary care provider []Other provider  Specify:                             []Unknown Reason (check all that apply): []History of Rhabdomyolysis []LDL-cholesterol already       <70 []Muscle      aches/pain/weakness []Mental       fogginess/memory loss []Liver dysfunction []Patient has  experienced other side   effects to this medication before []Patient will be unable to adhere/monitor []Patient unable to afford it []Patient does not want to take this medication []Pregnancy []Other (specify: ) []Unknown Reason Date discontinued:        / /             MM DD YYYY Reason (check all that apply): []Rhabdomyolysis []Muscle aches/pain/weakness []Mental fogginess/memory loss []Liver dysfunction []  Other medication side effects []Patient unable to          adhere/monitor []Patient unable to afford it []Patient no longer wants to take       this medication []Pregnancy []Other (specify: ) []Unknown Reason   If started or changed  Medication Name: []Atorvastatin (Lipitor) []Fluvastatin (Lescol) [x]Lovastatin (Mevacor) []Pravastatin (Pravachol) []Rosuvastatin (Crestor) []Simvastatin (Zocor) []Pitatavastatin (Livalo)  Dose: []1 mg []10 mg []2 mg [] 20 mg []3 mg [] 40 mg []4 mg [] 60 mg []5 mg [] 80 mg  Frequency: []Daily  []Less than daily      Does the patient have statin intolerance that prevents the use of maximum dose of high potency statin? []No []Yes >IF YES, Complete Statin Intolerance form   Non-statin lipid lowering therapy []No [x]Yes > Medication Name (check all that apply): []Colesevelam (Welchol) [x]Ezetimibe (Zetia) []Fibrate []Niacin []PCSK9 inhibitor []Omega 3 acid ethyl esters (Lovaza) []Icosapent  Ethyl (Vascepa) []Over the counter omega 3 fatty acid or fish oil supplement     Medication Currently Prescribed? Jardiance 25 mg Daily  If started since last visit: If not started since last visit: If stopped since last visit:  Diabetes Medications  SGLT2 Inhibitor  []No >  [x]Yes > Since last visit, medication was: []Stopped []Not started []Started [x]Continued same medication []Continued with medication changes Date started:        / /             MM DD YYYY Who prescribed? []Cardiology provider  []Study clinic []Outside clinic []Endocrinology      provider []Primary care provider []Other provider  Specify:                             []Unknown Reason (check all that apply): []eGFR <45 []HbA1c<7% on metformin monotherapy OR already on GLP1RA and do not need to start another anti- hyperglycemic []Already dehydrated []Low blood pressure []High risk of Hypoglycemia []Prior DKA []Recurrent mycotic genital infections []History of or at risk for amputation []Patient has experienced other side effects to this medication before []Patient will be unable to adhere/monitor []Patient unable to afford it []Patient does not want to take this medication []Pregnancy []Other (specify: ) []Unknown Reason Date discontinued:        / /             MM DD YYYY Reason (check all that apply  []eGFR now <45 []Dehydration []Low blood pressure []Hypoglycemia []DKA []Mycotic genital infection []Amputation []Other medication side effects []Patient unable    to adhere/monitor  []Had an operation/procedure     that required stopping it []Patient unable to afford it []Patient no longer wants to      take this medication []Pregnancy []Other (specify: ) []Unknown Reason   If started or changed  Medication Name: []Canaglifozin Anastasio Auerbach) []Dapagliflozin Wilder Glade) []Empaglifozin (Jardiance) []Ertugliflozin (Steglatro)           Medication Currently Prescribed? If  started since last visit: If not started since last visit: If stopped since last visit:  GLP1 Receptor Agonist  [x]No >  []Yes > Since last visit, medication was: []Stopped []Not started []Started []Continued same medication []Continued with medication changes Date started:        / /             MM DD YYYY Who prescribed? []Cardiology provider  []Study clinic []Outside clinic []Endocrinology       provider []Primary care provider []Other provider >  Specify:                             []Unknown Reason (check all that apply): []Personal or family history of medullary thyroid cancer []MEN2 []HbA1c<7% on metformin monotherapy OR already on SGLT2i and do not need to start another anti-hyperglycemic []eGFR now <30 []High risk of Hypoglycemia []History of pancreatitis []Significant gastroparesis []Prior gastric surgery []Patient has experienced other side effects to this medication before []Patient will be unable to adhere/monitor []Patient unable to afford it []Patient does not want to take this medication []Pregnancy []Other (specify: ) []Unknown Reason Date discontinued:        / /             MM DD [place] Reason (check all that apply): []Medullary thyroid cancer []MEN2 []eGFR now <30 []Hypoglycemia []Pancreatitis []Significant gastroparesis []Gastric surgery []Other medication side       effects []  Patient  unable    to adhere/monitor                                  []Had an operation/procedure that required stopping it []Patient unable to afford it []Patient no longer wants to take this medication []Pregnancy []Other (specify: ) []Unknown Reason   If started or changed > Medication Name: []Albiglutide (Tanzeum) []Dulaglutide (Trulicity) []Exanatide (Byetta, Bydureon) []Liraglutide (Victoza, Saxenda) []Lixisenatide (Adlyxin) []Semaglutice (Ozempic)      Medication Currently Prescribed? If started since last visit: If not started since last  visit: If stopped since last visit:  Other non Insulin diabetes medications []No [x]Yes > Medication Name (check all that apply): []Acarbose (Precose) []Miglitol (Glyset) []Glimepiride (Amaryl) []Glipizide (Amaryl) []Glyburide (Diabeta,       Glynase,   Micronase) [x]Metformin (Fortamet,        Glucophage[including XR],        Glumetza, Riomet) []Pioglitazone (Actos) []Nateglinide (Starlix) []Pramlintide (Symilin) []Repaglinide (Prandin) []Rosiglitazone (Avandia) []Alogliptin (Nesina) []Linagliptin (Tradjenta) []Saxagliptin (Onglyza) []Sitagliptin (Januvia) []Bromocriptine Quick Release (Cycloset)     Insulin [x]No [] Yes > total daily dose: units     STATIN INTOLERANCE (PER EHR/OTHER SOURCE DATA)  1. Was CK checked? [x]No []Yes   >If yes, select from the following: []CK not elevated []CK elevated 1-5x upper limit of normal []CK elevated >5x upper limit of normal  2. Does the patient have muscle symptoms? [x]No []Yes    >If yes, select from the following: Location and pattern of muscle symptoms (select all that apply) []Symmetric, hip flexors or thighs []Symmetric, calves []Symmetric, proximal upper extremity []Asymmetric, intermittent, or not specific to any area []Unknown   Timing of muscle symptom in relation to starting statin regimen []<4 weeks []4-12 weeks []>12 weeks []Unknown   Timing of muscle symptoms improvement after withdrawal of statin []<2 weeks []2-4 weeks []No improvement after 4 weeks []Unknown  3. Was patient re-challenged with a statin regimen (even if same statin compound or regimen as above)?  []No  []Yes  []Unknown  >If yes, select from the following: Timing of recurrence of similar muscle symptoms in relation to starting second regimen []<4 weeks []4-12 weeks []>12 weeks []Similar symptoms did not recur []Unknown   [email or web address]

## 2019-03-29 ENCOUNTER — Other Ambulatory Visit: Payer: Self-pay | Admitting: Family Medicine

## 2019-04-03 ENCOUNTER — Other Ambulatory Visit: Payer: Self-pay

## 2019-04-04 ENCOUNTER — Ambulatory Visit (INDEPENDENT_AMBULATORY_CARE_PROVIDER_SITE_OTHER): Payer: Medicare Other | Admitting: *Deleted

## 2019-04-04 DIAGNOSIS — E538 Deficiency of other specified B group vitamins: Secondary | ICD-10-CM

## 2019-04-09 NOTE — Progress Notes (Signed)
HPI The patient presents for followup of atrial fib.   The patient has paroxysmal atrial fibrillation and is being treated with flecainide. She has had recurrent GI bleeding and could not tolerate anticoagulation.  She has done well since I last saw her.  She has had no recurrent fibrillation since around February.  She still getting follow-up of her watchman device at [facility].  She is due to get a TEE.  Has had no new shortness of breath, PND or orthopnea.  She had no weight gain or edema.  Said no chest pressure, neck or arm discomfort.        Allergies  Allergen Reactions  . Quinapril Hcl Cough  . Statins Other (See Comments)    Not all Statins but some cause cough and pain in legs.  . Tape Other (See Comments)    Redness, please use "paper" tape    Current Outpatient Medications  Medication Sig Dispense Refill  . aspirin EC 81 MG tablet Take by mouth.    . blood glucose meter kit and supplies Dispense based on patient and insurance preference. Use up to four times daily as directed. E11.9 1 each 0  . chlorthalidone (HYGROTON) 25 MG tablet Take 1 tablet (25 mg total) by mouth daily. 90 tablet 1  . CRANBERRY PO Take 1 tablet by mouth 2 (two) times daily.    Marland Kitchen diltiazem (CARDIZEM CD) 180 MG 24 hr capsule Take 1 capsule (180 mg total) by mouth daily. 90 capsule 3  . diltiazem (CARDIZEM) 30 MG tablet TAKE 1 TABLET BY MOUTH EVERY DAY (Patient taking differently: Take only when heart is out of rhythm) 90 tablet 1  . empagliflozin (JARDIANCE) 25 MG TABS tablet Take 25 mg by mouth daily before breakfast. 30 tablet 11  . ezetimibe (ZETIA) 10 MG tablet Take 1 tablet (10 mg total) by mouth daily. 90 tablet 3  . flecainide (TAMBOCOR) 150 MG tablet Take 0.5 tablets (75 mg total) by mouth 2 (two) times daily. 90 tablet 3  . furosemide (LASIX) 20 MG tablet Take 1 tablet (20 mg total) by mouth daily as needed for fluid. 90 tablet 0  . glipiZIDE (GLUCOTROL) 5 MG tablet Take 1 tablet (5 mg  total) by mouth 2 (two) times daily. 180 tablet 3  . glucose blood (ACCU-CHEK GUIDE) test strip USE TO CHECK BLOOD GLUCOSE ONCE DAILY Dx E11.9 100 each 3  . ketoconazole (NIZORAL) 2 % cream Apply 1 application topically daily. x7-10 days (for skin yeast) 15 g 0  . Lancet Device MISC Use to test sugar once daily E11.9 100 each 12  . levothyroxine (SYNTHROID) 112 MCG tablet TAKE 1 TABLET BY MOUTH EVERY OTHER DAY, ALTERNATING WITH 1 & 1/2 TABLETS EVERY OTHER DAY 112 tablet 3  . losartan (COZAAR) 25 MG tablet Take 1 tablet (25 mg total) by mouth daily. 90 tablet 2  . lovastatin (MEVACOR) 40 MG tablet TAKE 1 TABLET BY MOUTH EVERY DAY IN THE EVENING 90 tablet 3  . metFORMIN (GLUCOPHAGE) 1000 MG tablet Take 1 tablet (1,000 mg total) by mouth 2 (two) times daily with a meal. 180 tablet 3  . metoprolol succinate (TOPROL-XL) 100 MG 24 hr tablet Take 1 tablet (100 mg total) by mouth daily. Take with or immediately following a meal. 90 tablet 3  . Probiotic Product (PROBIOTIC-10 PO) Take by mouth.    Marland Kitchen rOPINIRole (REQUIP) 0.5 MG tablet Take 1 tablet (0.5 mg total) by mouth at bedtime. [place]  tablet 1   Current Facility-Administered Medications  Medication Dose Route Frequency Provider Last Rate Last Dose  . cyanocobalamin ((VITAMIN B-12)) injection 1,000 mcg  1,000 mcg Intramuscular Q30 days Timmothy Euler, MD   1,000 mcg at 04/04/19 1009    Past Medical History:  Diagnosis Date  . Anemia   . Arthritis   . Atrial fibrillation, rapid ([place]) 10/27/2013  . Bilateral carotid artery disease ([place])   . Diabetes mellitus without complication ([place])   . Difficult intubation    states 'lady that did the sleep study told me I have the smallest airway she has ever seen in an adult"  . Dyslipidemia 06/29/2017  . Dysrhythmia   . Hypertension   . Hypothyroid   . Obesity   . Obstructive sleep apnea    on C Pap  . PAF (paroxysmal atrial fibrillation) ([place])    a. newly dx in 09/2013; on eliquis    Past Surgical  History:  Procedure Laterality Date  . CATARACT EXTRACTION Left   . CATARACT EXTRACTION W/PHACO Right 12/09/2015   Procedure: CATARACT EXTRACTION PHACO AND INTRAOCULAR LENS PLACEMENT (IOC);  Surgeon: Tonny Branch, MD;  Location: [facility];  Service: Ophthalmology;  Laterality: Right;  CDE:10.48  . COLONOSCOPY W/ POLYPECTOMY    . cyst on back of neck removed    . DILATION AND CURETTAGE OF UTERUS     x 5  . ENDARTERECTOMY Left 08/30/2015   Procedure: LEFT CAROYID ENDARTERECTOMY WITH XENOSURE BOVINE PERICARDIUM PATCH ANGIOPLASTY;  Surgeon: [name], MD;  Location: [facility];  Service: Vascular;  Laterality: Left;  . EYE SURGERY    . TONSILLECTOMY    . UPPER GI ENDOSCOPY      ROS:   As stated in the HPI and negative for all other systems.  PHYSICAL EXAM BP 137/61   Pulse (!) 55   Ht 5' 0.5" (1.537 m)   Wt 227 lb (103 kg)   SpO2 98%   BMI 43.60 kg/m   GENERAL:  Well appearing NECK:  No jugular venous distention, waveform within normal limits, carotid upstroke brisk and symmetric, no bruits, no thyromegaly LUNGS:  Clear to auscultation bilaterally CHEST:  Unremarkable HEART:  PMI not displaced or sustained,S1 and S2 within normal limits, no S3, no S4, no clicks, no rubs, 2 out of 6 apical systolic murmur rating slightly at aortic outflow tract, no diastolic murmurs ABD:  Flat, positive bowel sounds normal in frequency in pitch, no bruits, no rebound, no guarding, no midline pulsatile mass, no hepatomegaly, no splenomegaly EXT:  2 plus pulses throughout, no edema, no cyanosis no clubbing   EKG:  NA  ASSESSMENT AND PLAN  ATRIAL FIB:   .  [name] has a CHA2DS2 - VASc score of 4 with a risk of stroke of 4%.   She is not status post watchman.  No change in therapy.  She will continue flecainide.  She has had no symptomatic complaints.  GI BLEEDING:   Has had no further GI bleeding her last hemoglobin was 11.3.   HTN: Blood pressure is at target.  No change in therapy.    CAROTID STENOSIS:  She had 60 - 79% right stenosis and left 40 - 59% stenosis in March.    OBESITY:   She is maintained some weight loss.  I encouraged more of the same.  DM: A1c was 7.7.  No change in therapy.

## 2019-04-11 ENCOUNTER — Encounter: Payer: Self-pay | Admitting: Cardiology

## 2019-04-11 ENCOUNTER — Other Ambulatory Visit: Payer: Self-pay

## 2019-04-11 ENCOUNTER — Ambulatory Visit (INDEPENDENT_AMBULATORY_CARE_PROVIDER_SITE_OTHER): Payer: Medicare Other | Admitting: Cardiology

## 2019-04-11 VITALS — BP 137/61 | HR 55 | Ht 60.5 in | Wt 227.0 lb

## 2019-04-11 DIAGNOSIS — I6523 Occlusion and stenosis of bilateral carotid arteries: Secondary | ICD-10-CM | POA: Diagnosis not present

## 2019-04-11 DIAGNOSIS — I10 Essential (primary) hypertension: Secondary | ICD-10-CM

## 2019-04-11 DIAGNOSIS — I480 Paroxysmal atrial fibrillation: Secondary | ICD-10-CM | POA: Diagnosis not present

## 2019-04-11 DIAGNOSIS — I350 Nonrheumatic aortic (valve) stenosis: Secondary | ICD-10-CM | POA: Diagnosis not present

## 2019-04-11 NOTE — Patient Instructions (Signed)
Medication Instructions:  NO CHANGE *If you need a refill on your cardiac medications before your next appointment, please call your pharmacy*  Lab Work: If you have labs (blood work) drawn today and your tests are completely normal, you will receive your results only by: Marland Kitchen MyChart Message (if you have MyChart) OR . A paper copy in the mail If you have any lab test that is abnormal or we need to change your treatment, we will call you to review the results.  Testing/Procedures: Your physician has requested that you have an echocardiogram. Echocardiography is a painless test that uses sound waves to create images of your heart. It provides your doctor with information about the size and shape of your heart and how well your heart's chambers and valves are working. This procedure takes approximately one hour. There are no restrictions for this procedure.Thomas 1 YEAR    Follow-Up: At [facility], you and your health needs are our priority.  As part of our continuing mission to provide you with exceptional heart care, we have created designated Provider Care Teams.  These Care Teams include your primary Cardiologist (physician) and Advanced Practice Providers (APPs -  Physician Assistants and Nurse Practitioners) who all work together to provide you with the care you need, when you need it.  Your next appointment:   12 months  The format for your next appointment:   In Person  Provider:   Minus Breeding, MD

## 2019-05-08 ENCOUNTER — Other Ambulatory Visit: Payer: Self-pay

## 2019-05-09 ENCOUNTER — Ambulatory Visit (INDEPENDENT_AMBULATORY_CARE_PROVIDER_SITE_OTHER): Payer: Medicare Other

## 2019-05-09 DIAGNOSIS — E538 Deficiency of other specified B group vitamins: Secondary | ICD-10-CM | POA: Diagnosis not present

## 2019-05-09 NOTE — Progress Notes (Signed)
Cyanocobalamin injection given to left deltoid.  Patient tolerated well. 

## 2019-05-11 ENCOUNTER — Ambulatory Visit (INDEPENDENT_AMBULATORY_CARE_PROVIDER_SITE_OTHER): Payer: Medicare Other | Admitting: Family Medicine

## 2019-05-11 DIAGNOSIS — B372 Candidiasis of skin and nail: Secondary | ICD-10-CM | POA: Diagnosis not present

## 2019-05-11 DIAGNOSIS — F5089 Other specified eating disorder: Secondary | ICD-10-CM | POA: Diagnosis not present

## 2019-05-11 MED ORDER — KETOCONAZOLE 2 % EX CREA: 1.0000 "application " | TOPICAL_CREAM | Freq: Every day | CUTANEOUS | 0 refills | Status: DC

## 2019-05-11 NOTE — Progress Notes (Signed)
Telephone visit  Subjective: CC: ice eating PCP: Janora Norlander, DO [id number] Maund is a 68 y.o. female calls for telephone consult today. Patient provides verbal consent for consult held via phone.  Location of patient: work Location of provider: Working remotely from home Others present for call: none  1. Ice eating Patient notes that recently she has started eating ice again.  She reports she does not feel bad at all.  Denies SOB, dizziness, hematochezia, melena.  2.  Vaginal rash Patient reports she does continue to have some irritation and tenderness along the rectum and anterior vagina.  She attributes this to use of Jardiance.  She does not want to go off of the medication though because it is controlling her blood sugar fairly well and was recommended by her heart doctor.  She will see how her A1c is in January before deciding if she is going to discontinue.  She is finding some improvement in this rash with use of topical barrier creams and intermittent use of ketoconazole but again no total resolution.  ROS: Per HPI  Allergies  Allergen Reactions  . Quinapril Hcl Cough  . Statins Other (See Comments)    Not all Statins but some cause cough and pain in legs.  . Tape Other (See Comments)    Redness, please use "paper" tape   Past Medical History:  Diagnosis Date  . Anemia   . Arthritis   . Atrial fibrillation, rapid ([place]) 10/27/2013  . Bilateral carotid artery disease ([place])   . Diabetes mellitus without complication ([place])   . Difficult intubation    states 'lady that did the sleep study told me I have the smallest airway she has ever seen in an adult"  . Dyslipidemia 06/29/2017  . Dysrhythmia   . Hypertension   . Hypothyroid   . Obesity   . Obstructive sleep apnea    on C Pap  . PAF (paroxysmal atrial fibrillation) ([place])    a. newly dx in 09/2013; on eliquis    Current Outpatient Medications:  .  aspirin EC 81 MG tablet, Take by mouth., Disp: , Rfl:  .   blood glucose meter kit and supplies, Dispense based on patient and insurance preference. Use up to four times daily as directed. E11.9, Disp: 1 each, Rfl: 0 .  chlorthalidone (HYGROTON) 25 MG tablet, Take 1 tablet (25 mg total) by mouth daily., Disp: 90 tablet, Rfl: 1 .  CRANBERRY PO, Take 1 tablet by mouth 2 (two) times daily., Disp: , Rfl:  .  diltiazem (CARDIZEM CD) 180 MG 24 hr capsule, Take 1 capsule (180 mg total) by mouth daily., Disp: 90 capsule, Rfl: 3 .  diltiazem (CARDIZEM) 30 MG tablet, TAKE 1 TABLET BY MOUTH EVERY DAY (Patient taking differently: Take only when heart is out of rhythm), Disp: 90 tablet, Rfl: 1 .  empagliflozin (JARDIANCE) 25 MG TABS tablet, Take 25 mg by mouth daily before breakfast., Disp: 30 tablet, Rfl: 11 .  ezetimibe (ZETIA) 10 MG tablet, Take 1 tablet (10 mg total) by mouth daily., Disp: 90 tablet, Rfl: 3 .  flecainide (TAMBOCOR) 150 MG tablet, Take 0.5 tablets (75 mg total) by mouth 2 (two) times daily., Disp: 90 tablet, Rfl: 3 .  furosemide (LASIX) 20 MG tablet, Take 1 tablet (20 mg total) by mouth daily as needed for fluid., Disp: 90 tablet, Rfl: 0 .  glipiZIDE (GLUCOTROL) 5 MG tablet, Take 1 tablet (5 mg total) by mouth 2 (two) times daily., Disp: 180 tablet,  Rfl: 3 .  glucose blood (ACCU-CHEK GUIDE) test strip, USE TO CHECK BLOOD GLUCOSE ONCE DAILY Dx E11.9, Disp: 100 each, Rfl: 3 .  ketoconazole (NIZORAL) 2 % cream, Apply 1 application topically daily. x7-10 days (for skin yeast), Disp: 15 g, Rfl: 0 .  Lancet Device MISC, Use to test sugar once daily E11.9, Disp: 100 each, Rfl: 12 .  levothyroxine (SYNTHROID) 112 MCG tablet, TAKE 1 TABLET BY MOUTH EVERY OTHER DAY, ALTERNATING WITH 1 & 1/2 TABLETS EVERY OTHER DAY, Disp: 112 tablet, Rfl: 3 .  losartan (COZAAR) 25 MG tablet, Take 1 tablet (25 mg total) by mouth daily., Disp: 90 tablet, Rfl: 2 .  lovastatin (MEVACOR) 40 MG tablet, TAKE 1 TABLET BY MOUTH EVERY DAY IN THE EVENING, Disp: 90 tablet, Rfl: 3 .   metFORMIN (GLUCOPHAGE) 1000 MG tablet, Take 1 tablet (1,000 mg total) by mouth 2 (two) times daily with a meal., Disp: 180 tablet, Rfl: 3 .  metoprolol succinate (TOPROL-XL) 100 MG 24 hr tablet, Take 1 tablet (100 mg total) by mouth daily. Take with or immediately following a meal., Disp: 90 tablet, Rfl: 3 .  Probiotic Product (PROBIOTIC-10 PO), Take by mouth., Disp: , Rfl:  .  rOPINIRole (REQUIP) 0.5 MG tablet, Take 1 tablet (0.5 mg total) by mouth at bedtime., Disp: 90 tablet, Rfl: 1  Current Facility-Administered Medications:  .  cyanocobalamin ((VITAMIN B-12)) injection 1,000 mcg, 1,000 mcg, Intramuscular, Q30 days, Timmothy Euler, MD, 1,000 mcg at 05/09/19 0931  Assessment/ Plan: 68 y.o. 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  Been an issue for patient in the past. - Fingerstick Hemoglobin; Future - CBC; Future - Ferritin; Future  2. Candidal intertrigo Renewal of ketoconazole sent.  Continue current therapies.  May to consider alternative therapies to Jardiance including injectables which have been shown to be cardioprotective as well. - ketoconazole (NIZORAL) 2 % cream; Apply 1 application topically daily. x7-10 days (for skin yeast)  Dispense: 60 g; Refill: 0   Start time: 11:15am End time: 11:22am  Total time spent on patient care (including telephone call/ virtual visit): 12 minutes  [place], [place] [phone]

## 2019-05-15 ENCOUNTER — Other Ambulatory Visit: Payer: Medicare Other

## 2019-05-15 ENCOUNTER — Other Ambulatory Visit: Payer: Self-pay

## 2019-05-15 DIAGNOSIS — F5089 Other specified eating disorder: Secondary | ICD-10-CM | POA: Diagnosis not present

## 2019-05-15 LAB — HEMOGLOBIN, FINGERSTICK: Hemoglobin: 11.9 g/dL (ref 11.1–15.9)

## 2019-05-16 DIAGNOSIS — Z1231 Encounter for screening mammogram for malignant neoplasm of breast: Secondary | ICD-10-CM | POA: Diagnosis not present

## 2019-05-16 LAB — CBC
Hematocrit: 35.4 % (ref 34.0–46.6)
Hemoglobin: 11.3 g/dL (ref 11.1–15.9)
MCH: 27.4 pg (ref 26.6–33.0)
MCHC: 31.9 g/dL (ref 31.5–35.7)
MCV: 86 fL (ref 79–97)
Platelets: 290 10*3/uL (ref 150–450)
RBC: 4.13 x10E6/uL (ref 3.77–5.28)
RDW: 15 % (ref 11.7–15.4)
WBC: 9.8 10*3/uL (ref 3.4–10.8)

## 2019-05-16 LAB — FERRITIN: Ferritin: 13 ng/mL — ABNORMAL LOW (ref 15–150)

## 2019-05-17 ENCOUNTER — Telehealth: Payer: Self-pay | Admitting: Family Medicine

## 2019-05-17 NOTE — Telephone Encounter (Signed)
Probably does not need to quarantine if she is asymptomatic and only had a brief interaction with this person, particularly if she was masked and is washing hands properly.  If she wants to get tested at some point, would wait for a minimum of 3 days after interaction with positive person.  Though, again, I think she is likely low risk given above information.

## 2019-05-17 NOTE — Telephone Encounter (Signed)
Patient aware and verbalized understanding. °

## 2019-05-18 ENCOUNTER — Other Ambulatory Visit: Payer: Self-pay | Admitting: Family Medicine

## 2019-05-20 ENCOUNTER — Encounter (HOSPITAL_COMMUNITY): Payer: Self-pay

## 2019-05-20 ENCOUNTER — Emergency Department (HOSPITAL_COMMUNITY)
Admission: EM | Admit: 2019-05-20 | Discharge: 2019-05-20 | Disposition: A | Discharge status: Home / Self Care | Payer: Medicare Other | Attending: Emergency Medicine | Admitting: Emergency Medicine

## 2019-05-20 ENCOUNTER — Telehealth: Payer: Self-pay | Admitting: Cardiology

## 2019-05-20 ENCOUNTER — Other Ambulatory Visit: Payer: Self-pay

## 2019-05-20 DIAGNOSIS — Z794 Long term (current) use of insulin: Secondary | ICD-10-CM | POA: Diagnosis not present

## 2019-05-20 DIAGNOSIS — I10 Essential (primary) hypertension: Secondary | ICD-10-CM | POA: Insufficient documentation

## 2019-05-20 DIAGNOSIS — Z7982 Long term (current) use of aspirin: Secondary | ICD-10-CM | POA: Insufficient documentation

## 2019-05-20 DIAGNOSIS — E785 Hyperlipidemia, unspecified: Secondary | ICD-10-CM | POA: Diagnosis not present

## 2019-05-20 DIAGNOSIS — R002 Palpitations: Secondary | ICD-10-CM | POA: Diagnosis not present

## 2019-05-20 DIAGNOSIS — I4891 Unspecified atrial fibrillation: Secondary | ICD-10-CM | POA: Diagnosis present

## 2019-05-20 DIAGNOSIS — E1169 Type 2 diabetes mellitus with other specified complication: Secondary | ICD-10-CM | POA: Insufficient documentation

## 2019-05-20 DIAGNOSIS — E039 Hypothyroidism, unspecified: Secondary | ICD-10-CM | POA: Insufficient documentation

## 2019-05-20 DIAGNOSIS — E1159 Type 2 diabetes mellitus with other circulatory complications: Secondary | ICD-10-CM | POA: Insufficient documentation

## 2019-05-20 DIAGNOSIS — Z79899 Other long term (current) drug therapy: Secondary | ICD-10-CM | POA: Insufficient documentation

## 2019-05-20 LAB — CBC WITH DIFFERENTIAL/PLATELET
Abs Immature Granulocytes: 0.06 10*3/uL (ref 0.00–0.07)
Basophils Absolute: 0.1 10*3/uL (ref 0.0–0.1)
Basophils Relative: 1 %
Eosinophils Absolute: 0.1 10*3/uL (ref 0.0–0.5)
Eosinophils Relative: 2 %
HCT: 39.5 % (ref 36.0–46.0)
Hemoglobin: 12 g/dL (ref 12.0–15.0)
Immature Granulocytes: 1 %
Lymphocytes Relative: 7 %
Lymphs Abs: 0.6 10*3/uL — ABNORMAL LOW (ref 0.7–4.0)
MCH: 27.4 pg (ref 26.0–34.0)
MCHC: 30.4 g/dL (ref 30.0–36.0)
MCV: 90.2 fL (ref 80.0–100.0)
Monocytes Absolute: 0.5 10*3/uL (ref 0.1–1.0)
Monocytes Relative: 5 %
Neutro Abs: 7.3 10*3/uL (ref 1.7–7.7)
Neutrophils Relative %: 84 %
Platelets: 252 10*3/uL (ref 150–400)
RBC: 4.38 MIL/uL (ref 3.87–5.11)
RDW: 16.2 % — ABNORMAL HIGH (ref 11.5–15.5)
WBC: 8.7 10*3/uL (ref 4.0–10.5)
nRBC: 0 % (ref 0.0–0.2)

## 2019-05-20 LAB — BASIC METABOLIC PANEL
Anion gap: 12 (ref 5–15)
BUN: 30 mg/dL — ABNORMAL HIGH (ref 8–23)
CO2: 27 mmol/L (ref 22–32)
Calcium: 9 mg/dL (ref 8.9–10.3)
Chloride: 98 mmol/L (ref 98–111)
Creatinine, Ser: 0.79 mg/dL (ref 0.44–1.00)
GFR calc Af Amer: >60 mL/min (ref >60)
GFR calc non Af Amer: >60 mL/min (ref >60)
Glucose, Bld: 257 mg/dL — ABNORMAL HIGH (ref 70–99)
Potassium: 3.6 mmol/L (ref 3.5–5.1)
Sodium: 137 mmol/L (ref 135–145)

## 2019-05-20 NOTE — Telephone Encounter (Signed)
Patient called this morning saying that her heart rate was out of rhythm as of 7 AM.  She has a history of PAF.  She is not on anticoagulation, she is status post watchman device.  She has been on flecainide diltiazem and Toprol 100.  She had called EMS and they were at the scene.  Since her vital signs were stable as far as blood pressure and she was not in distress they suggested she call her cardiologist to try and avoid going to the emergency room.  I spoke to the patient at length.  She was in no distress although she could tell that her heart rate was fast.  She had already taken an extra diltiazem 30 mg.  I spoke with the EMS at the scene.  Her blood pressure was stable, her heart rate was 120.  I suggested the patient take an extra 50 mg of Toprol and give it an hour or so.  If her heart rate remained fast and she was uncomfortable then I think she is probably should go to the emergency room although she did not appear to be a candidate for urgent cardioversion in the emergency room.  I will also arrange for an RN to contact her early next week.  Kerin Ransom PA-C 05/20/2019 8:15 AM

## 2019-05-20 NOTE — ED Triage Notes (Signed)
EMs reports pt has history of afib.  Called ems this morning because she felt like she was having palpitations.  EMS says when they arrived pt's hr was 120-150.  Called pt's pcp and was told to take 1/2 toprol tablet but says pt became anxious when ems started to leave so they transported pt here and pt converted to nsr minutes before arriving.  Pt says she feels normal now.

## 2019-05-20 NOTE — Discharge Instructions (Addendum)
Return if problems.  Contact your doctor next week and let them know how you are doing

## 2019-05-20 NOTE — ED Provider Notes (Signed)
[facility] EMERGENCY DEPARTMENT Provider Note   CSN: [id number] Arrival date & time: 05/20/19  5809     History Chief Complaint  Patient presents with  . Atrial Fibrillation    Emily [name] is a 68 y.o. female.   The patient goes into atrial fib occasionally.  She was in atrial fib today and she took an extra Cardizem and an extra half of metoprolol.  By the time she got to the emergency department she had converted to normal sinus rhythm.  The history is provided by the patient. No language interpreter was used.  Atrial Fibrillation This is a recurrent problem. The current episode started less than 1 hour ago. The problem occurs rarely. The problem has been resolved. Pertinent negatives include no chest pain, no abdominal pain and no headaches. Nothing aggravates the symptoms. Nothing relieves the symptoms. She has tried nothing for the symptoms.       Past Medical History:  Diagnosis Date  . Anemia   . Arthritis   . Atrial fibrillation, rapid ([place]) 10/27/2013  . Bilateral carotid artery disease ([place])   . Diabetes mellitus without complication ([place])   . Difficult intubation    states 'lady that did the sleep study told me I have the smallest airway she has ever seen in an adult"  . Dyslipidemia 06/29/2017  . Dysrhythmia   . Hypertension   . Hypothyroid   . Obesity   . Obstructive sleep apnea    on C Pap  . PAF (paroxysmal atrial fibrillation) ([place])    a. newly dx in 09/2013; on eliquis    Patient Active Problem List   Diagnosis Date Noted  . Restless leg syndrome 02/16/2018  . Chronic left-sided low back pain with left-sided sciatica 02/16/2018  . Morbid obesity ([place]) 02/14/2018  . Bruit 02/14/2018  . PAF (paroxysmal atrial fibrillation) ([place]) 02/14/2018  . DDD (degenerative disc disease), lumbar 12/07/2017  . Peripheral arterial disease ([place]) 07/06/2017  . Hyperlipidemia associated with type 2 diabetes mellitus ([place]) 06/29/2017  . Iron deficiency anemia  05/27/2017  . Vitamin B12 deficiency 05/27/2017  . Pain in joint, ankle and foot 06/03/2016  . Carotid stenosis 08/30/2015  . Bilateral carotid artery disease ([place]) 07/05/2015  . Acute bronchitis 01/31/2015  . Diabetes mellitus without complication ([place])   . Hypertension associated with diabetes ([place]) 10/27/2013  . Hypothyroidism due to acquired atrophy of thyroid 10/27/2013    Past Surgical History:  Procedure Laterality Date  . CATARACT EXTRACTION Left   . CATARACT EXTRACTION W/PHACO Right 12/09/2015   Procedure: CATARACT EXTRACTION PHACO AND INTRAOCULAR LENS PLACEMENT (IOC);  Surgeon: Tonny Branch, MD;  Location: [facility];  Service: Ophthalmology;  Laterality: Right;  CDE:10.48  . COLONOSCOPY W/ POLYPECTOMY    . cyst on back of neck removed    . DILATION AND CURETTAGE OF UTERUS     x 5  . ENDARTERECTOMY Left 08/30/2015   Procedure: LEFT CAROYID ENDARTERECTOMY WITH XENOSURE BOVINE PERICARDIUM PATCH ANGIOPLASTY;  Surgeon: [name], MD;  Location: [place];  Service: Vascular;  Laterality: Left;  . EYE SURGERY    . TONSILLECTOMY    . UPPER GI ENDOSCOPY       OB History   No obstetric history on file.     Family History  Problem Relation Age of Onset  . COPD Father   . Heart failure Father   . Heart disease Father   . Emphysema Father   . Arrhythmia Sister   . Arrhythmia Sister   .  Arrhythmia Sister        had PPM also  . Cancer Sister     Social History   Tobacco Use  . Smoking status: Never Smoker  . Smokeless tobacco: Never Used  Substance Use Topics  . Alcohol use: No    Alcohol/week: 0.0 standard drinks  . Drug use: Never    Home Medications Prior to Admission medications   Medication Sig Start Date End Date Taking? Authorizing Provider  aspirin EC 81 MG tablet Take by mouth. 09/15/18   [provider]  blood glucose meter kit and supplies Dispense based on patient and insurance preference. Use up to four times daily as directed. E11.9 06/28/18    Ronnie Doss M, DO  chlorthalidone (HYGROTON) 25 MG tablet Take 1 tablet (25 mg total) by mouth daily. 02/22/19   Minus Breeding, MD  CRANBERRY PO Take 1 tablet by mouth 2 (two) times daily.    [provider]  diltiazem (CARDIZEM CD) 180 MG 24 hr capsule Take 1 capsule (180 mg total) by mouth daily. 08/30/18 04/11/19  Minus Breeding, MD  diltiazem (CARDIZEM) 30 MG tablet TAKE 1 TABLET BY MOUTH EVERY DAY Patient taking differently: Take only when heart is out of rhythm 12/08/17   Minus Breeding, MD  empagliflozin (JARDIANCE) 25 MG TABS tablet Take 25 mg by mouth daily before breakfast. 02/28/19   Janora Norlander, DO  ezetimibe (ZETIA) 10 MG tablet Take 1 tablet (10 mg total) by mouth daily. 08/30/18   Minus Breeding, MD  flecainide (TAMBOCOR) 150 MG tablet Take 0.5 tablets (75 mg total) by mouth 2 (two) times daily. 06/15/18   Minus Breeding, MD  furosemide (LASIX) 20 MG tablet Take 1 tablet (20 mg total) by mouth daily as needed for fluid. 03/08/18 12/26/20  Janora Norlander, DO  glipiZIDE (GLUCOTROL) 5 MG tablet Take 1 tablet (5 mg total) by mouth 2 (two) times daily. 11/15/18   Janora Norlander, DO  glucose blood (ACCU-CHEK GUIDE) test strip USE TO CHECK BLOOD GLUCOSE ONCE DAILY Dx E11.9 09/20/18   Ronnie Doss M, DO  ketoconazole (NIZORAL) 2 % cream Apply 1 application topically daily. x7-10 days (for skin yeast) 05/11/19   Janora Norlander, DO  Lancet Device MISC Use to test sugar once daily E11.9 06/28/18   Ronnie Doss M, DO  levothyroxine (SYNTHROID) 112 MCG tablet TAKE 1 TABLET BY MOUTH EVERY OTHER DAY, ALTERNATING WITH 1 & 1/2 TABLETS EVERY OTHER DAY 03/29/19   Ronnie Doss M, DO  losartan (COZAAR) 25 MG tablet Take 1 tablet (25 mg total) by mouth daily. 02/15/19   Almyra Deforest, PA  lovastatin (MEVACOR) 40 MG tablet TAKE 1 TABLET BY MOUTH EVERY DAY IN THE EVENING 10/03/18   Minus Breeding, MD  metFORMIN (GLUCOPHAGE) 1000 MG tablet Take 1 tablet (1,000 mg  total) by mouth 2 (two) times daily with a meal. 11/15/18   Ronnie Doss M, DO  metoprolol succinate (TOPROL-XL) 100 MG 24 hr tablet Take 1 tablet (100 mg total) by mouth daily. Take with or immediately following a meal. 11/15/18   Ronnie Doss M, DO  Probiotic Product (PROBIOTIC-10 PO) Take by mouth.    [provider]  rOPINIRole (REQUIP) 0.5 MG tablet TAKE 1 TABLET (0.5 MG TOTAL) BY MOUTH AT BEDTIME. 05/18/19   Ronnie Doss M, DO    Allergies    Quinapril hcl, Statins, and Tape  Review of Systems   Review of Systems  Constitutional: Negative for appetite change and fatigue.  HENT: Negative for congestion, ear discharge and sinus pressure.   Eyes: Negative for discharge.  Respiratory: Negative for cough.   Cardiovascular: Positive for palpitations. Negative for chest pain.  Gastrointestinal: Negative for abdominal pain and diarrhea.  Genitourinary: Negative for frequency and hematuria.  Musculoskeletal: Negative for back pain.  Skin: Negative for rash.  Neurological: Negative for seizures and headaches.  Psychiatric/Behavioral: Negative for hallucinations.    Physical Exam Updated Vital Signs BP (!) 105/39   Pulse (!) 52   Temp 97.8 F (36.6 C) (Oral)   Resp 19   Ht _0  (1.575 m)   Wt 102.1 kg   SpO2 99%   BMI 41.15 kg/m   Physical Exam Vitals and nursing note reviewed.  Constitutional:      Appearance: She is well-developed.  HENT:     Head: Normocephalic.     Nose: Nose normal.  Eyes:     General: No scleral icterus.    Conjunctiva/sclera: Conjunctivae normal.  Neck:     Thyroid: No thyromegaly.  Cardiovascular:     Rate and Rhythm: Normal rate and regular rhythm.     Heart sounds: No murmur. No friction rub. No gallop.   Pulmonary:     Breath sounds: No stridor. No wheezing or rales.  Chest:     Chest wall: No tenderness.  Abdominal:     General: There is no distension.     Tenderness: There is no abdominal tenderness. There is no  rebound.  Musculoskeletal:        General: Normal range of motion.     Cervical back: Neck supple.  Lymphadenopathy:     Cervical: No cervical adenopathy.  Skin:    Findings: No erythema or rash.  Neurological:     Mental Status: She is oriented to person, place, and time.     Motor: No abnormal muscle tone.     Coordination: Coordination normal.  Psychiatric:        Behavior: Behavior normal.     ED Results / Procedures / Treatments   Labs (all labs ordered are listed, but only abnormal results are displayed) Labs Reviewed  CBC WITH DIFFERENTIAL/PLATELET - Abnormal; Notable for the following components:      Result Value   RDW 16.2 (*)    Lymphs Abs 0.6 (*)    All other components within normal limits  BASIC METABOLIC PANEL - Abnormal; Notable for the following components:   Glucose, Bld 257 (*)    BUN 30 (*)    All other components within normal limits    EKG EKG Interpretation  Date/Time:  Saturday May 20 2019 09:45:48 EST Ventricular Rate:  59 PR Interval:    QRS Duration: 145 QT Interval:  490 QTC Calculation: 486 R Axis:   -94 Text Interpretation: Sinus rhythm Nonspecific IVCD with LAD Lateral infarct, age indeterminate Confirmed by Milton Ferguson [phone]) on 05/20/2019 9:58:03 AM   Radiology No results found.  Procedures Procedures (including critical care time)  Medications Ordered in ED Medications - No data to display  ED Course  I have reviewed the triage vital signs and the nursing notes.  Pertinent labs & imaging results that were available during my care of the patient were reviewed by me and considered in my medical decision making (see chart for details).    MDM Rules/Calculators/A&P                      Patient with atrial fibrillation that has resolved.  Patient with normal sinus rhythm now.  Labs unremarkable.  She will follow-up with her doctor Final Clinical Impression(s) / ED Diagnoses Final diagnoses:  Palpitations    Rx  / DC Orders ED Discharge Orders    None       Milton Ferguson, MD 05/20/19 1057

## 2019-05-22 ENCOUNTER — Telehealth: Payer: Self-pay

## 2019-05-22 NOTE — Telephone Encounter (Signed)
Spoke patient and she stated she went back in to rhythm on her way back to the hospital. Patient states she is doing ok and she is fine when she gets back into rhythm.

## 2019-06-06 ENCOUNTER — Other Ambulatory Visit: Payer: Self-pay | Admitting: Cardiology

## 2019-06-06 ENCOUNTER — Ambulatory Visit: Payer: Medicare Other | Admitting: Family Medicine

## 2019-06-12 ENCOUNTER — Other Ambulatory Visit: Payer: Self-pay

## 2019-06-13 ENCOUNTER — Encounter: Payer: Self-pay | Admitting: Family Medicine

## 2019-06-13 ENCOUNTER — Ambulatory Visit (INDEPENDENT_AMBULATORY_CARE_PROVIDER_SITE_OTHER): Payer: Medicare Other | Admitting: Family Medicine

## 2019-06-13 VITALS — BP 139/61 | HR 56 | Temp 97.3°F | Ht 62.0 in | Wt 225.0 lb

## 2019-06-13 DIAGNOSIS — L309 Dermatitis, unspecified: Secondary | ICD-10-CM

## 2019-06-13 DIAGNOSIS — E119 Type 2 diabetes mellitus without complications: Secondary | ICD-10-CM | POA: Diagnosis not present

## 2019-06-13 DIAGNOSIS — E785 Hyperlipidemia, unspecified: Secondary | ICD-10-CM

## 2019-06-13 DIAGNOSIS — E1159 Type 2 diabetes mellitus with other circulatory complications: Secondary | ICD-10-CM | POA: Diagnosis not present

## 2019-06-13 DIAGNOSIS — E034 Atrophy of thyroid (acquired): Secondary | ICD-10-CM | POA: Diagnosis not present

## 2019-06-13 DIAGNOSIS — E538 Deficiency of other specified B group vitamins: Secondary | ICD-10-CM | POA: Diagnosis not present

## 2019-06-13 DIAGNOSIS — E1169 Type 2 diabetes mellitus with other specified complication: Secondary | ICD-10-CM

## 2019-06-13 DIAGNOSIS — I10 Essential (primary) hypertension: Secondary | ICD-10-CM

## 2019-06-13 DIAGNOSIS — I152 Hypertension secondary to endocrine disorders: Secondary | ICD-10-CM

## 2019-06-13 LAB — BAYER DCA HB A1C WAIVED: HB A1C (BAYER DCA - WAIVED): 8.3 % — ABNORMAL HIGH (ref <7.0)

## 2019-06-13 MED ORDER — TRIAMCINOLONE ACETONIDE 0.1 % EX CREA: 1.0000 "application " | TOPICAL_CREAM | Freq: Two times a day (BID) | CUTANEOUS | 0 refills | Status: DC

## 2019-06-13 NOTE — Progress Notes (Signed)
Subjective: CC: DM2, HTN, HLD PCP: Janora Norlander, DO AUQ:JFHLK Emily [name] is a 69 y.o. female presenting to clinic today for:  1. Type 2 Diabetes w/ hypertension and hyperlipidemia:  Patient reports that she continues to have some genital itching with Jardiance.  She will be starting a probiotic soon to help with this.   She is prescribed Metformin 1065m BID, Glipizide 5 BID, jardiance 140m Cozaar 2554mMetoprolol XL 100m6malso on Diltiazem and flecainide for afib and Lasix prn fluid), Lovastatin 40, Zetia.  She admits that ate very poorly over the holidays.  She spot checks her BGs and she has been seeing 200s.  She does not consistently check.  No CP, SOB, dizziness, falls.  She does reports afib acted up a few weeks ago.  She is going for TEE 1/15.  Last eye exam: 11/29/2018 Last foot exam: UTD Last A1c: . Lab Results  Component Value Date   HGBA1C 7.7 (H) 02/28/2019   Nephropathy screen indicated?: on ACE-I Last flu, zoster and/or pneumovax: Flu, PNA Immunization History  Administered Date(s) Administered  . Fluad Quad(high Dose 65+) 02/28/2019  . Influenza, High Dose Seasonal PF 03/24/2016, 03/22/2018  . Influenza, Seasonal, Injecte, Preservative Fre 06/05/2014, 07/02/2015  . Pneumococcal Conjugate-13 07/09/2015  . Pneumococcal Polysaccharide-23 01/30/2014, 02/28/2019  . Tdap 05/26/2014   2.  Rash Patient reports a 6-week history of left-sided posterior neck rash.  It occasionally does seem itchy but does not hurt.  She initially thought she had burned herself with a curling iron but the rash seems to be getting slightly bigger.  She has not applied anything to the affected area.  ROS: Per HPI  Allergies  Allergen Reactions  . Quinapril Hcl Cough  . Statins Other (See Comments)    Not all Statins but some cause cough and pain in legs.  . Tape Other (See Comments)    Redness, please use "paper" tape   Past Medical History:  Diagnosis Date  . Anemia   .  Arthritis   . Atrial fibrillation, rapid (HCC)[place]29/2015  . Bilateral carotid artery disease (HCC)[place]. Diabetes mellitus without complication (HCC)[place]. Difficult intubation    states 'lady that did the sleep study told me I have the smallest airway she has ever seen in an adult"  . Dyslipidemia 06/29/2017  . Dysrhythmia   . Hypertension   . Hypothyroid   . Obesity   . Obstructive sleep apnea    on C Pap  . PAF (paroxysmal atrial fibrillation) (HCC)[place] a. newly dx in 09/2013; on eliquis    Current Outpatient Medications:  .  aspirin EC 81 MG tablet, Take by mouth., Disp: , Rfl:  .  blood glucose meter kit and supplies, Dispense based on patient and insurance preference. Use up to four times daily as directed. E11.9, Disp: 1 each, Rfl: 0 .  chlorthalidone (HYGROTON) 25 MG tablet, Take 1 tablet (25 mg total) by mouth daily., Disp: 90 tablet, Rfl: 1 .  CRANBERRY PO, Take 1 tablet by mouth 2 (two) times daily., Disp: , Rfl:  .  diltiazem (CARDIZEM CD) 180 MG 24 hr capsule, Take 1 capsule (180 mg total) by mouth daily., Disp: 90 capsule, Rfl: 3 .  diltiazem (CARDIZEM) 30 MG tablet, TAKE 1 TABLET BY MOUTH EVERY DAY (Patient taking differently: Take only when heart is out of rhythm), Disp: 90 tablet, Rfl: 1 .  empagliflozin (JARDIANCE) 25 MG TABS tablet, Take 25 mg by mouth  daily before breakfast., Disp: 30 tablet, Rfl: 11 .  ezetimibe (ZETIA) 10 MG tablet, Take 1 tablet (10 mg total) by mouth daily., Disp: 90 tablet, Rfl: 3 .  flecainide (TAMBOCOR) 150 MG tablet, TAKE 0.5 TABLETS (75 MG TOTAL) BY MOUTH 2 (TWO) TIMES DAILY., Disp: 90 tablet, Rfl: 3 .  furosemide (LASIX) 20 MG tablet, Take 1 tablet (20 mg total) by mouth daily as needed for fluid., Disp: 90 tablet, Rfl: 0 .  glipiZIDE (GLUCOTROL) 5 MG tablet, Take 1 tablet (5 mg total) by mouth 2 (two) times daily., Disp: 180 tablet, Rfl: 3 .  glucose blood (ACCU-CHEK GUIDE) test strip, USE TO CHECK BLOOD GLUCOSE ONCE DAILY Dx E11.9, Disp: 100  each, Rfl: 3 .  ketoconazole (NIZORAL) 2 % cream, Apply 1 application topically daily. x7-10 days (for skin yeast), Disp: 60 g, Rfl: 0 .  Lancet Device MISC, Use to test sugar once daily E11.9, Disp: 100 each, Rfl: 12 .  levothyroxine (SYNTHROID) 112 MCG tablet, TAKE 1 TABLET BY MOUTH EVERY OTHER DAY, ALTERNATING WITH 1 & 1/2 TABLETS EVERY OTHER DAY, Disp: 112 tablet, Rfl: 3 .  losartan (COZAAR) 25 MG tablet, Take 1 tablet (25 mg total) by mouth daily., Disp: 90 tablet, Rfl: 2 .  lovastatin (MEVACOR) 40 MG tablet, TAKE 1 TABLET BY MOUTH EVERY DAY IN THE EVENING, Disp: 90 tablet, Rfl: 3 .  metFORMIN (GLUCOPHAGE) 1000 MG tablet, Take 1 tablet (1,000 mg total) by mouth 2 (two) times daily with a meal., Disp: 180 tablet, Rfl: 3 .  metoprolol succinate (TOPROL-XL) 100 MG 24 hr tablet, Take 1 tablet (100 mg total) by mouth daily. Take with or immediately following a meal., Disp: 90 tablet, Rfl: 3 .  Probiotic Product (PROBIOTIC-10 PO), Take by mouth., Disp: , Rfl:  .  rOPINIRole (REQUIP) 0.5 MG tablet, TAKE 1 TABLET (0.5 MG TOTAL) BY MOUTH AT BEDTIME., Disp: 90 tablet, Rfl: 0  Current Facility-Administered Medications:  .  cyanocobalamin ((VITAMIN B-12)) injection 1,000 mcg, 1,000 mcg, Intramuscular, Q30 days, Timmothy Euler, MD, 1,000 mcg at 05/09/19 2979 Social History   Socioeconomic History  . Marital status: Single    Spouse name: Not on file  . Number of children: Not on file  . Years of education: Not on file  . Highest education level: Not on file  Occupational History  . Not on file  Tobacco Use  . Smoking status: Never Smoker  . Smokeless tobacco: Never Used  Substance and Sexual Activity  . Alcohol use: No    Alcohol/week: 0.0 standard drinks  . Drug use: Never  . Sexual activity: Never  Other Topics Concern  . Not on file  Social History Narrative  . Not on file   Social Determinants of Health   Financial Resource Strain:   . Difficulty of Paying Living Expenses: Not  on file  Food Insecurity:   . Worried About Charity fundraiser in the Last Year: Not on file  . Ran Out of Food in the Last Year: Not on file  Transportation Needs:   . Lack of Transportation (Medical): Not on file  . Lack of Transportation (Non-Medical): Not on file  Physical Activity:   . Days of Exercise per Week: Not on file  . Minutes of Exercise per Session: Not on file  Stress:   . Feeling of Stress : Not on file  Social Connections:   . Frequency of Communication with Friends and Family: Not on file  . Frequency of  Social Gatherings with Friends and Family: Not on file  . Attends Religious Services: Not on file  . Active Member of Clubs or Organizations: Not on file  . Attends Archivist Meetings: Not on file  . Marital Status: Not on file  Intimate Partner Violence:   . Fear of Current or Ex-Partner: Not on file  . Emotionally Abused: Not on file  . Physically Abused: Not on file  . Sexually Abused: Not on file   Family History  Problem Relation Age of Onset  . COPD Father   . Heart failure Father   . Heart disease Father   . Emphysema Father   . Arrhythmia Sister   . Arrhythmia Sister   . Arrhythmia Sister        had PPM also  . Cancer Sister     Objective: Office vital signs reviewed. BP 139/61   Pulse (!) 56   Temp (!) 97.3 F (36.3 C) (Temporal)   Ht 5' 2"  (1.575 m)   Wt 225 lb (102.1 kg)   SpO2 97%   BMI 41.15 kg/m   Physical Examination:  General: Awake, alert, well nourished, No acute distress Cardio: bradycardic w/ regular rhythm, S1S2 heard, 2/6 SEM appreciated at bilateral sternal borders Pulm: clear to auscultation bilaterally, no wheezes, rhonchi or rales; normal work of breathing on room air Extremities: warm, well perfused, No edema, cyanosis or clubbing; +2 pulses bilaterally Skin: Patient has a 2 inch x 1-1/2 inch irregularly shaped patch of blanching erythema along the left posterior neck.  This is not tender to palpation but  the texture of the skin is thickened.  No skin breakdown or bleeding.  No pustule or vesicles.  Assessment/ Plan: 69 y.o. female   1. Diabetes mellitus without complication (HCC) Uncontrolled.  I counseled patient at length with regards to diet and I encouraged her to start something like Ozempic or Trulicity but she is very reluctant to start anything.  She now has completed that we give her 3 more months to modify diet.  We discussed that if A1c remains above 7 at next visit, we need to do something more to control her blood sugar.  We could certainly add Januvia to the Metformin versus proceed with injectable medication.  At this time she is not having any genitourinary symptoms - Bayer DCA Hb A1c Waived  2. Hyperlipidemia associated with type 2 diabetes mellitus ([place]) Continue statin  3. Hypertension associated with diabetes ([place]) Blood pressure is controlled  4. Hypothyroidism due to acquired atrophy of thyroid Check thyroid panel - Thyroid Panel With TSH  5. Dermatitis Triamcinolone prescribed for presumed dermatitis.  She will contact me if symptoms worsen or do not improve - triamcinolone cream (KENALOG) 0.1 %; Apply 1 application topically 2 (two) times daily. To neck x7-10d  Dispense: 30 g; Refill: 0   No orders of the defined types were placed in this encounter.  No orders of the defined types were placed in this encounter.    Janora Norlander, DO [place] [phone]

## 2019-06-13 NOTE — Patient Instructions (Signed)
A1c is going up.

## 2019-06-14 LAB — THYROID PANEL WITH TSH
Free Thyroxine Index: 2.7 (ref 1.2–4.9)
T3 Uptake Ratio: 28 % (ref 24–39)
T4, Total: 9.5 ug/dL (ref 4.5–12.0)
TSH: 3.16 u[IU]/mL (ref 0.450–4.500)

## 2019-06-27 ENCOUNTER — Ambulatory Visit: Payer: Medicare Other

## 2019-07-10 ENCOUNTER — Encounter: Payer: Self-pay | Admitting: *Deleted

## 2019-07-10 ENCOUNTER — Ambulatory Visit: Payer: Medicare Other | Attending: Internal Medicine

## 2019-07-10 DIAGNOSIS — Z23 Encounter for immunization: Secondary | ICD-10-CM | POA: Insufficient documentation

## 2019-07-10 DIAGNOSIS — Z006 Encounter for examination for normal comparison and control in clinical research program: Secondary | ICD-10-CM

## 2019-07-10 NOTE — Research (Signed)
Marland KitchenMarland KitchenMarland KitchenMarland KitchenMarland KitchenMarland KitchenMarland Kitchen   COORDINATE-Diabetes 9 Month Patient Questionnaire (Intervention) Site #:   [id number]   Patient ID:        [id number]           3 MONTH QUESTIONNAIRE  Visit Date 07/10/19   Vital Status [x]   Patient Alive > Proceed to Visit Status []   Patient Dead > Complete Death Form only []   Unknown > Proceed to Visit Status  Visit Status Was the interview completed? []  No >IF NO, Select reason why [] Unable to locate                                    [] No Valid Contacts (patients or alternates) [] Multiple attempts to valid contacts []  Patient no longer cared for at study clinic []  Patient withdrew []  Other, specify:                                           >IF NO, Last date of contact: / /             MM      DD        YYYY    [x] Yes >IF YES, Select source of Interview: []   Proxy [x]   Patient     3 Month Patient Questionnaire (Intervention)  Instructions:   1. Prior to speaking with the patient either over the phone or in person, print off or have available the patient's current list of medications.      IF conducting follow-up visit over the phone - Instruct the patient to gather all their pill bottles. 2. For the purpose of the COORDINATE-Diabetes Study, please document whether the patient is currently taking each of the following medication classes. 3. DO NOT PROMPT DURING FOLLOW-UP VISIT: IF subject mentions reaction to one of the following medications, complete the AE/SAE section and follow instructions in operations manual regarding Adverse event reporting to [employer] ([facility]).  MEDICATIONS  Medication Are you currently taking [insert name of previous med]? If currently taking: If started since last visit: If stopped since last visit:  ACE Inhibitor / Angiotensin Receptor Blocker (ARB) / Angiotensin Receptor Neprilysin inhibitor (ARNi) [] No >  [x] Yes > [] Stopped since last visit [] Never prescribed [] Started since last visit [x] Same medication as last      visit [] Different medication than       last visit [Study personnel to answer]  Documented in EHR? [] No  [x] Yes   If no, Verified by: [] Photo of bottle [] Copy of rx [] Dispensing       pharmacy [] Prescribing provider [] Other (specify):                    Date started:        / /             MM DD YYYY Who prescribed this medication for you? [] Cardiology provider > [] Study clinic [] Outside clinic [] Endocrinology provider [] Primary care provider [] Other provider > Specify:                             [] Unknown Date discontinued:        / /             MM DD YYYY Why did you stop taking this medication? (  check all that apply) [] Allergic reaction [] Medication side effects [] Unable to       adhere/monitor [] Had an      operation/procedure      that required      stopping it [] Unable to afford it [] No longer wants        to take        this medication [] Provider decision [] Pregnancy [] Other (specify: ) [] Unknown Reason   If started or changed  What medication are you taking now? [] Benazepril (Lotensin) [] Captopril (Capoten) [] Enalapril (Vasotec) [] Fosinopril (Monopril) [] Lisinopril (Zestril, Prinivil) [] Quinapril (Accupril) [] Ramipril (Altace) [] Azilsartan (Edarbi) [] Candesartan (Atacand) [] Irbesartan (Avapro) [x] Losartan (Cozaar) [] Olmesartan (Benicar) [] Telmisartan (Micardis) [] Valsartan (Diovan) [] Sacrubitril/Valsartan Delene Loll)      Medication Are you currently taking [insert name of previous med]? If currently taking: If started since last visit: If stopped since last visit:  Statin []  No >  [x] Yes > [] Stopped since last visit [] Never prescribed [] Started since last visit [x] Same medication as last        visit [] Different medication or dose     than last visit [Study personnel to answer]  Documented in EHR? [] No [x] Yes    If no, Verified by: [] Photo of bottle [] Copy of rx [] Dispensing pharmacy [] Prescribing  provider [] Other (specify):                    Date started:        / /             MM DD YYYY Who prescribed this medication for you? [] Cardiology provider  [] Study clinic [] Outside clinic [] Endocrinology provider [] Primary care provider [] Other provider  Specify:                             [] Unknown Date discontinued:        / /             MM DD YYYY Why did you stop taking this medication? (check all that apply) [] Allergic reaction [] Medication side effects  [] Muscle aches [] Weakness [] Joint pain [] Cognitive symptoms [] Other (specify):                          [] Unable to        adhere/monitor [] Had an    operation/procedure    that required stopping it [] Unable to afford it [] No longer wants       to take this            medication [] Provider decision [] Pregnancy [] Other (specify: ) [] Unknown Reason   If started or changed  What medication are you taking now? [] Atorvastatin (Lipitor) [] Fluvastatin (Lescol) [x] Lovastatin (Mevacor) [] Pravastatin (Pravachol) [] Rosuvastatin (Crestor) [] Simvastatin (Zocor) [] Pitatavastatin (Livalo)  Dose: [] 1 mg  [] 10 mg [] 2 mg  []  20 mg [] 3 mg  []  40 mg [] 4 mg  []  60 mg [] 5 mg  []  80 mg  Frequency: [] Daily []  Less than daily      Medication Are you currently taking [insert name of previous med]? If currently taking: If started since last visit: If stopped since last visit:  SGLT2 Inhibitor [] No   [x] Yes [] Stopped since last visit [] Never prescribed [] Started since last visit [] Same medication as last      visit [] Different medication than last visit [Study personnel to answer]  Documented in EHR? [] No [x] Yes   If no, Verified by: [] Photo of bottle [] Copy of rx [] Dispensing  pharmacy [] Prescribing       provider [] Other (specify):                    Date started:        / /             MM DD YYYY Who prescribed this medication for you? [] Cardiology provider  [] Study clinic [] Outside  clinic [] Endocrinology provider [] Primary care provider [] Other provider  Specify:                             [] Unknown Date discontinued:        / /             MM DD YYYY Why did you stop taking this medication? (check all that apply) [] Allergic reaction [] Medication side effects [] Unable to       adhere/monitor [] Had an        operation/procedure         that required           stopping it [] Patient unable to       afford it [] Patient no longer       wants to       take this medication [] Provider decision [] Pregnancy [] Other (specify: ) [] Unknown Reason   If started or changed  What medication are you taking now? [] Canaglifozin (Invokana) [] Dapagliflozin (Farxiga) [x] Empaglifozin (Jardiance) [] Ertugliflozin (Steglatro)     GLP1 Receptor Agonist [] No   [] Yes [] Stopped since last visit [] Never prescribed [] Started since last visit [] Same medication as last      visit [] Different medication than       last visit [Study personnel to answer]  Documented in EHR? [] No [] Yes   If no, Verified by: [] Photo of bottle [] Copy of rx [] Dispensing       pharmacy [] Prescribing       provider [] Other (specify):                    Date started:        / /             MM DD YYYY Who prescribed this medication for you? [] Cardiology provider  [] Study clinic [] Outside clinic [] Endocrinology provider [] Primary care provider [] Other provider  Specify:                             [] Unknown Date discontinued:        / /             MM DD YYYY Why did you stop taking this medication? (check all that apply) [] Allergic reaction [] Medication side effects [] Unable to      adhere/monitor [] Had an    operation/procedure    that required stopping it [] Patient unable to        afford it [] Patient no longer        wants to take this        medication [] Provider decision [] Pregnancy [] Other (specify: ) [] Unknown Reason   If started or changed  What medication  are you taking now? [] Albiglutide (Tanzeum) [] Dulaglutide (Trulicity) [] Exanatide (Byetta, Bydureon) [] Liraglutide (Victoza, Saxenda) [] Lixisenatide (Adlyxin) [] Semaglutice (Ozempic)     08/09/2018 (EDC Release)

## 2019-07-10 NOTE — Progress Notes (Signed)
   Covid-19 Vaccination Clinic  Name:  Emily [name]    MRN: [id number] DOB: 05/18/1951  07/10/2019  Emily [name] was observed post Covid-19 immunization for 15 minutes without incidence. She was provided with Vaccine Information Sheet and instruction to access the V-Safe system.   Emily [name] was instructed to call 911 with any severe reactions post vaccine: Marland Kitchen Difficulty breathing  . Swelling of your face and throat  . A fast heartbeat  . A bad rash all over your body  . Dizziness and weakness    Immunizations Administered    Name Date Dose VIS Date Route   Pfizer COVID-19 Vaccine 07/10/2019  9:48 AM 0.3 mL 05/12/2019 Intramuscular   Manufacturer: [place]   Lot: [id number]   [place]: [id number]

## 2019-07-11 DIAGNOSIS — Z01812 Encounter for preprocedural laboratory examination: Secondary | ICD-10-CM | POA: Diagnosis not present

## 2019-07-11 DIAGNOSIS — Z95818 Presence of other cardiac implants and grafts: Secondary | ICD-10-CM | POA: Diagnosis not present

## 2019-07-11 DIAGNOSIS — I480 Paroxysmal atrial fibrillation: Secondary | ICD-10-CM | POA: Diagnosis not present

## 2019-07-14 ENCOUNTER — Telehealth: Payer: Self-pay | Admitting: Family Medicine

## 2019-07-14 NOTE — Telephone Encounter (Signed)
Patient aware, she will contact cardiologist about heart medications.

## 2019-07-14 NOTE — Telephone Encounter (Signed)
Ok to hold sugar meds.  Would reach out to cardiology about her heart medications though. I suspect they will want her to continue those.

## 2019-07-14 NOTE — Telephone Encounter (Signed)
Pt called to let Dr Darnell Level know that she is having a procedure done this coming Monday 07/17/19 for a TEE. Was advised to call Dr Darnell Level to see what medications is ok for her to take before procedure and what not to take.

## 2019-07-17 DIAGNOSIS — I700 Atherosclerosis of aorta: Secondary | ICD-10-CM | POA: Diagnosis not present

## 2019-07-17 DIAGNOSIS — I080 Rheumatic disorders of both mitral and aortic valves: Secondary | ICD-10-CM | POA: Diagnosis not present

## 2019-07-17 DIAGNOSIS — E119 Type 2 diabetes mellitus without complications: Secondary | ICD-10-CM | POA: Diagnosis not present

## 2019-07-17 DIAGNOSIS — I4891 Unspecified atrial fibrillation: Secondary | ICD-10-CM | POA: Diagnosis not present

## 2019-07-18 ENCOUNTER — Other Ambulatory Visit: Payer: Self-pay

## 2019-07-18 ENCOUNTER — Ambulatory Visit: Payer: Medicare Other

## 2019-07-19 ENCOUNTER — Ambulatory Visit (INDEPENDENT_AMBULATORY_CARE_PROVIDER_SITE_OTHER): Payer: Medicare Other | Admitting: *Deleted

## 2019-07-19 DIAGNOSIS — E538 Deficiency of other specified B group vitamins: Secondary | ICD-10-CM | POA: Diagnosis not present

## 2019-07-19 NOTE — Progress Notes (Signed)
B-12 injection given in right deltoid, patient tolerated well 

## 2019-08-03 ENCOUNTER — Ambulatory Visit: Payer: Medicare Other | Attending: Internal Medicine

## 2019-08-03 DIAGNOSIS — Z23 Encounter for immunization: Secondary | ICD-10-CM | POA: Insufficient documentation

## 2019-08-03 NOTE — Progress Notes (Signed)
   Covid-19 Vaccination Clinic  Name:  Emily [name]    MRN: [id number] DOB: 1950/11/23  08/03/2019  [name] was observed post Covid-19 immunization for 15 minutes without incident. She was provided with Vaccine Information Sheet and instruction to access the V-Safe system.   [name] was instructed to call 911 with any severe reactions post vaccine: Marland Kitchen Difficulty breathing  . Swelling of face and throat  . A fast heartbeat  . A bad rash all over body  . Dizziness and weakness   Immunizations Administered    Name Date Dose VIS Date Route   Pfizer COVID-19 Vaccine 08/03/2019  4:01 PM 0.3 mL 05/12/2019 Intramuscular   Manufacturer: [place]   Lot: [id number]   [place]: [id number]

## 2019-08-14 ENCOUNTER — Other Ambulatory Visit: Payer: Self-pay | Admitting: Family Medicine

## 2019-08-15 ENCOUNTER — Other Ambulatory Visit: Payer: Self-pay

## 2019-08-15 ENCOUNTER — Other Ambulatory Visit: Payer: Self-pay | Admitting: Cardiology

## 2019-08-15 ENCOUNTER — Ambulatory Visit (INDEPENDENT_AMBULATORY_CARE_PROVIDER_SITE_OTHER): Payer: Medicare Other | Admitting: *Deleted

## 2019-08-15 DIAGNOSIS — E538 Deficiency of other specified B group vitamins: Secondary | ICD-10-CM | POA: Diagnosis not present

## 2019-08-15 NOTE — Progress Notes (Signed)
Pt given B12 injection IM left deltoid and tolerated well. °

## 2019-08-15 NOTE — Patient Instructions (Signed)

## 2019-08-17 ENCOUNTER — Other Ambulatory Visit: Payer: Self-pay | Admitting: Cardiology

## 2019-08-25 ENCOUNTER — Telehealth (HOSPITAL_COMMUNITY): Payer: Self-pay

## 2019-08-25 NOTE — Telephone Encounter (Signed)

## 2019-08-28 ENCOUNTER — Other Ambulatory Visit: Payer: Self-pay | Admitting: *Deleted

## 2019-08-28 DIAGNOSIS — I6523 Occlusion and stenosis of bilateral carotid arteries: Secondary | ICD-10-CM

## 2019-08-29 ENCOUNTER — Ambulatory Visit (HOSPITAL_COMMUNITY)
Admission: RE | Admit: 2019-08-29 | Discharge: 2019-08-29 | Disposition: A | Discharge status: Home / Self Care | Payer: Medicare Other | Source: Ambulatory Visit | Attending: Vascular Surgery | Admitting: Vascular Surgery

## 2019-08-29 ENCOUNTER — Other Ambulatory Visit: Payer: Self-pay

## 2019-08-29 ENCOUNTER — Ambulatory Visit (INDEPENDENT_AMBULATORY_CARE_PROVIDER_SITE_OTHER): Payer: Medicare Other | Admitting: Physician Assistant

## 2019-08-29 VITALS — BP 139/53 | HR 50 | Temp 98.3°F | Ht 62.0 in | Wt 227.6 lb

## 2019-08-29 DIAGNOSIS — I6523 Occlusion and stenosis of bilateral carotid arteries: Secondary | ICD-10-CM | POA: Diagnosis not present

## 2019-08-29 NOTE — Progress Notes (Signed)
HISTORY AND PHYSICAL     CC:  follow up. Requesting Provider:  Janora Norlander, DO  HPI: This is a 69 y.o. female here for follow up for carotid artery stenosis.  Pt is s/p left CEA by Dr. Trula Slade on 07/31/2015 for asymptomatic bilateral ICA stenosis.   Pt was last seen September 2020 and at that time, she was asymptomatic.  She had pain in her left leg, which she attributed to back issues.    She has a hx of 2 bleeding gastric polyps that had been addressed by GI in [place] but at her last visit, she was still anemic and requiring the iron transfusions.    She has a hx of having a Watchman procedure in February 2020 at [facility].  She is followed by Dr. Percival Spanish here in [place].  She had a TEE, her Eliquis was d/c'd and she was taking Plavix/asa.   Pt returns today for follow up.  She denies any amaurosis fugax, speech difficulties, weakness, numbness, paralysis or clumsiness.  She has no pain in lower extremities when walking.  The pt is on a statin and Zetia for cholesterol management.  The pt is on a daily aspirin.   Other AC: None The pt is on BB, CCB, ARB for hypertension.   The pt is diabetic.   Tobacco hx:  never   Past Medical History:  Diagnosis Date  . Anemia   . Arthritis   . Atrial fibrillation, rapid ([place]) 10/27/2013  . Bilateral carotid artery disease ([place])   . Diabetes mellitus without complication ([place])   . Difficult intubation    states 'lady that did the sleep study told me I have the smallest airway she has ever seen in an adult"  . Dyslipidemia 06/29/2017  . Dysrhythmia   . Hypertension   . Hypothyroid   . Obesity   . Obstructive sleep apnea    on C Pap  . PAF (paroxysmal atrial fibrillation) ([place])    a. newly dx in 09/2013; on eliquis    Past Surgical History:  Procedure Laterality Date  . CATARACT EXTRACTION Left   . CATARACT EXTRACTION W/PHACO Right 12/09/2015   Procedure: CATARACT EXTRACTION PHACO AND INTRAOCULAR LENS PLACEMENT (IOC);  Surgeon: Tonny Branch, MD;  Location: [facility];  Service: Ophthalmology;  Laterality: Right;  CDE:10.48  . COLONOSCOPY W/ POLYPECTOMY    . cyst on back of neck removed    . DILATION AND CURETTAGE OF UTERUS     x 5  . ENDARTERECTOMY Left 08/30/2015   Procedure: LEFT CAROYID ENDARTERECTOMY WITH XENOSURE BOVINE PERICARDIUM PATCH ANGIOPLASTY;  Surgeon: [name], MD;  Location: [place];  Service: Vascular;  Laterality: Left;  . EYE SURGERY    . TONSILLECTOMY    . UPPER GI ENDOSCOPY      Allergies  Allergen Reactions  . Quinapril Hcl Cough  . Statins Other (See Comments)    Not all Statins but some cause cough and pain in legs.  . Tape Other (See Comments)    Redness, please use "paper" tape    Current Outpatient Medications  Medication Sig Dispense Refill  . chlorthalidone (HYGROTON) 25 MG tablet TAKE 1 TABLET BY MOUTH EVERY DAY 90 tablet 1  . aspirin EC 81 MG tablet Take by mouth.    . blood glucose meter kit and supplies Dispense based on patient and insurance preference. Use up to four times daily as directed. E11.9 1 each 0  . CRANBERRY PO Take 1 tablet by  mouth 2 (two) times daily.    Marland Kitchen diltiazem (CARDIZEM CD) 180 MG 24 hr capsule TAKE 1 CAPSULE BY MOUTH EVERY DAY 90 capsule 1  . diltiazem (CARDIZEM) 30 MG tablet TAKE 1 TABLET BY MOUTH EVERY DAY (Patient taking differently: Take only when heart is out of rhythm) 90 tablet 1  . empagliflozin (JARDIANCE) 25 MG TABS tablet Take 25 mg by mouth daily before breakfast. 30 tablet 11  . ezetimibe (ZETIA) 10 MG tablet TAKE 1 TABLET BY MOUTH EVERY DAY 90 tablet 1  . flecainide (TAMBOCOR) 150 MG tablet TAKE 0.5 TABLETS (75 MG TOTAL) BY MOUTH 2 (TWO) TIMES DAILY. 90 tablet 3  . furosemide (LASIX) 20 MG tablet Take 1 tablet (20 mg total) by mouth daily as needed for fluid. 90 tablet 0  . glipiZIDE (GLUCOTROL) 5 MG tablet Take 1 tablet (5 mg total) by mouth 2 (two) times daily. 180 tablet 3  . glucose blood (ACCU-CHEK GUIDE) test strip USE TO CHECK BLOOD  GLUCOSE ONCE DAILY Dx E11.9 100 each 3  . ketoconazole (NIZORAL) 2 % cream Apply 1 application topically daily. x7-10 days (for skin yeast) 60 g 0  . Lancet Device MISC Use to test sugar once daily E11.9 100 each 12  . levothyroxine (SYNTHROID) 112 MCG tablet TAKE 1 TABLET BY MOUTH EVERY OTHER DAY, ALTERNATING WITH 1 & 1/2 TABLETS EVERY OTHER DAY 112 tablet 3  . losartan (COZAAR) 25 MG tablet Take 1 tablet (25 mg total) by mouth daily. 90 tablet 2  . lovastatin (MEVACOR) 40 MG tablet TAKE 1 TABLET BY MOUTH EVERY DAY IN THE EVENING 90 tablet 3  . metFORMIN (GLUCOPHAGE) 1000 MG tablet Take 1 tablet (1,000 mg total) by mouth 2 (two) times daily with a meal. 180 tablet 3  . metoprolol succinate (TOPROL-XL) 100 MG 24 hr tablet Take 1 tablet (100 mg total) by mouth daily. Take with or immediately following a meal. 90 tablet 3  . Probiotic Product (PROBIOTIC-10 PO) Take by mouth.    Marland Kitchen rOPINIRole (REQUIP) 0.5 MG tablet TAKE 1 TABLET (0.5 MG TOTAL) BY MOUTH AT BEDTIME. 90 tablet 0  . triamcinolone cream (KENALOG) 0.1 % Apply 1 application topically 2 (two) times daily. To neck x7-10d 30 g 0   Current Facility-Administered Medications  Medication Dose Route Frequency Provider Last Rate Last Admin  . cyanocobalamin ((VITAMIN B-12)) injection 1,000 mcg  1,000 mcg Intramuscular Q30 days Timmothy Euler, MD   1,000 mcg at 08/15/19 1040    Family History  Problem Relation Age of Onset  . COPD Father   . Heart failure Father   . Heart disease Father   . Emphysema Father   . Arrhythmia Sister   . Arrhythmia Sister   . Arrhythmia Sister        had PPM also  . Cancer Sister     Social History   Socioeconomic History  . Marital status: Single    Spouse name: Not on file  . Number of children: Not on file  . Years of education: Not on file  . Highest education level: Not on file  Occupational History  . Not on file  Tobacco Use  . Smoking status: Never Smoker  . Smokeless tobacco: Never Used    Substance and Sexual Activity  . Alcohol use: No    Alcohol/week: 0.0 standard drinks  . Drug use: Never  . Sexual activity: Never  Other Topics Concern  . Not on file  Social History Narrative  .  Not on file   Social Determinants of Health   Financial Resource Strain:   . Difficulty of Paying Living Expenses:   Food Insecurity:   . Worried About Charity fundraiser in the Last Year:   . Arboriculturist in the Last Year:   Transportation Needs:   . Film/video editor (Medical):   Marland Kitchen Lack of Transportation (Non-Medical):   Physical Activity:   . Days of Exercise per Week:   . Minutes of Exercise per Session:   Stress:   . Feeling of Stress :   Social Connections:   . Frequency of Communication with Friends and Family:   . Frequency of Social Gatherings with Friends and Family:   . Attends Religious Services:   . Active Member of Clubs or Organizations:   . Attends Archivist Meetings:   Marland Kitchen Marital Status:   Intimate Partner Violence:   . Fear of Current or Ex-Partner:   . Emotionally Abused:   Marland Kitchen Physically Abused:   . Sexually Abused:      REVIEW OF SYSTEMS:   [X]  denotes positive finding, [ ]  denotes negative finding Cardiac  Comments:  Chest pain or chest pressure:    Shortness of breath upon exertion:    Short of breath when lying flat:    Irregular heart rhythm:        Vascular    Pain in calf, thigh, or hip brought on by ambulation:    Pain in feet at night that wakes you up from your sleep:     Blood clot in your veins:    Leg swelling:         Pulmonary    Oxygen at home:    Productive cough:     Wheezing:         Neurologic    Sudden weakness in arms or legs:     Sudden numbness in arms or legs:     Sudden onset of difficulty speaking or slurred speech:    Temporary loss of vision in one eye:     Problems with dizziness:         Gastrointestinal    Blood in stool:     Vomited blood:         Genitourinary    Burning when  urinating:     Blood in urine:        Psychiatric    Major depression:         Hematologic    Bleeding problems:    Problems with blood clotting too easily:        Skin    Rashes or ulcers:        Constitutional    Fever or chills:      PHYSICAL EXAMINATION:  Today's Vitals   08/29/19 1149 08/29/19 1156  BP: 123/68 (!) 139/53  Pulse:  (!) 50  Temp: 98.3 F (36.8 C)   SpO2: 98% 99%  Weight: 227 lb 9.6 oz (103.2 kg)   Height: 5' 2"  (1.575 m)    Body mass index is 41.63 kg/m.  General:  WDWN in NAD; vital signs documented above Gait: Not observed HENT: WNL, normocephalic Pulmonary: normal non-labored breathing , without Rales, rhonchi,  wheezing Cardiac: regular HR, with systolic murmur, rubs or gallops; with carotid bruits Abdomen: soft, NT, no masses Skin: without rashes Vascular Exam/Pulses:  Right Left  Radial 2+ (normal) 2+ (normal)  Ulnar Not eval Not eval  Femoral Not palp Not palp  Popliteal Not palp Not palp  DP Not palp Not palp  PT Not palp Not palp   Extremities: without ischemic changes, without Gangrene , without cellulitis; without open wounds; feet are warm and well perfused Musculoskeletal: no muscle wasting or atrophy  Neurologic: A&O X 3 Psychiatric:  The pt has Normal affect.   Non-Invasive Vascular Imaging:   Carotid Duplex on 08/29/2019: Right:  60-79% ICA stenosis Left:  1-39% ICA stenosis Normal flow bilateral subclavian arteries Antegrade flow bilateral vertebral arteries  Previous Carotid duplex on 02/14/2019: Right: 60-79% ICA stenosis Left:   1-39% ICA stenosis Vertebrals: Bilateral vertebral arteries demonstrate antegrade flow.  Subclavians: Normal flow hemodynamics were seen in bilateral subclavian arteries.    ASSESSMENT/PLAN:: 69 y.o. female here for follow up carotid artery stenosis.  She is s/p left CEA by Dr. Trula Slade on 07/31/2015 for asymptomatic bilateral ICA stenosis  -Review of her duplex study today reveals stable  right ICA stenosis.  Remains asymptomatic  -discussed s/s of stroke with pt and they understand should they develop any of these sx, they will go to the nearest ER.  Follow-up in 6 months with carotid duplex ultrasound.  Barbie Banner, PA-C [facility] [phone]  Clinic MD:  Early

## 2019-08-30 ENCOUNTER — Other Ambulatory Visit: Payer: Self-pay | Admitting: *Deleted

## 2019-08-30 DIAGNOSIS — Z79899 Other long term (current) drug therapy: Secondary | ICD-10-CM | POA: Diagnosis not present

## 2019-08-30 DIAGNOSIS — I350 Nonrheumatic aortic (valve) stenosis: Secondary | ICD-10-CM | POA: Diagnosis not present

## 2019-08-30 DIAGNOSIS — K589 Irritable bowel syndrome without diarrhea: Secondary | ICD-10-CM | POA: Diagnosis not present

## 2019-08-30 DIAGNOSIS — Z86010 Personal history of colonic polyps: Secondary | ICD-10-CM | POA: Diagnosis not present

## 2019-08-30 DIAGNOSIS — E785 Hyperlipidemia, unspecified: Secondary | ICD-10-CM | POA: Diagnosis not present

## 2019-08-30 DIAGNOSIS — R011 Cardiac murmur, unspecified: Secondary | ICD-10-CM | POA: Diagnosis not present

## 2019-08-30 DIAGNOSIS — Z91048 Other nonmedicinal substance allergy status: Secondary | ICD-10-CM | POA: Diagnosis not present

## 2019-08-30 DIAGNOSIS — E119 Type 2 diabetes mellitus without complications: Secondary | ICD-10-CM | POA: Diagnosis not present

## 2019-08-30 DIAGNOSIS — Z860101 Personal history of adenomatous and serrated colon polyps: Secondary | ICD-10-CM | POA: Insufficient documentation

## 2019-08-30 DIAGNOSIS — D175 Benign lipomatous neoplasm of intra-abdominal organs: Secondary | ICD-10-CM | POA: Diagnosis not present

## 2019-08-30 DIAGNOSIS — Z888 Allergy status to other drugs, medicaments and biological substances status: Secondary | ICD-10-CM | POA: Diagnosis not present

## 2019-08-30 DIAGNOSIS — I10 Essential (primary) hypertension: Secondary | ICD-10-CM | POA: Diagnosis not present

## 2019-08-30 DIAGNOSIS — Z1211 Encounter for screening for malignant neoplasm of colon: Secondary | ICD-10-CM | POA: Diagnosis not present

## 2019-08-30 DIAGNOSIS — M1990 Unspecified osteoarthritis, unspecified site: Secondary | ICD-10-CM | POA: Diagnosis not present

## 2019-08-30 DIAGNOSIS — I6523 Occlusion and stenosis of bilateral carotid arteries: Secondary | ICD-10-CM

## 2019-08-30 DIAGNOSIS — Z8249 Family history of ischemic heart disease and other diseases of the circulatory system: Secondary | ICD-10-CM | POA: Diagnosis not present

## 2019-08-30 DIAGNOSIS — Z7982 Long term (current) use of aspirin: Secondary | ICD-10-CM | POA: Diagnosis not present

## 2019-08-30 DIAGNOSIS — E039 Hypothyroidism, unspecified: Secondary | ICD-10-CM | POA: Diagnosis not present

## 2019-08-30 DIAGNOSIS — I480 Paroxysmal atrial fibrillation: Secondary | ICD-10-CM | POA: Diagnosis not present

## 2019-08-30 DIAGNOSIS — G4733 Obstructive sleep apnea (adult) (pediatric): Secondary | ICD-10-CM | POA: Diagnosis not present

## 2019-08-30 DIAGNOSIS — Z7984 Long term (current) use of oral hypoglycemic drugs: Secondary | ICD-10-CM | POA: Diagnosis not present

## 2019-09-08 ENCOUNTER — Other Ambulatory Visit: Payer: Self-pay

## 2019-09-08 ENCOUNTER — Encounter: Payer: Self-pay | Admitting: Family Medicine

## 2019-09-08 ENCOUNTER — Ambulatory Visit (INDEPENDENT_AMBULATORY_CARE_PROVIDER_SITE_OTHER): Payer: Medicare Other | Admitting: Family Medicine

## 2019-09-08 VITALS — BP 131/63 | HR 65 | Temp 97.6°F | Ht 62.0 in | Wt 222.2 lb

## 2019-09-08 DIAGNOSIS — E785 Hyperlipidemia, unspecified: Secondary | ICD-10-CM

## 2019-09-08 DIAGNOSIS — E1169 Type 2 diabetes mellitus with other specified complication: Secondary | ICD-10-CM

## 2019-09-08 DIAGNOSIS — E119 Type 2 diabetes mellitus without complications: Secondary | ICD-10-CM

## 2019-09-08 DIAGNOSIS — D500 Iron deficiency anemia secondary to blood loss (chronic): Secondary | ICD-10-CM

## 2019-09-08 DIAGNOSIS — I10 Essential (primary) hypertension: Secondary | ICD-10-CM

## 2019-09-08 DIAGNOSIS — M62838 Other muscle spasm: Secondary | ICD-10-CM

## 2019-09-08 DIAGNOSIS — H00015 Hordeolum externum left lower eyelid: Secondary | ICD-10-CM | POA: Diagnosis not present

## 2019-09-08 DIAGNOSIS — E1159 Type 2 diabetes mellitus with other circulatory complications: Secondary | ICD-10-CM | POA: Diagnosis not present

## 2019-09-08 LAB — HEMOGLOBIN, FINGERSTICK: Hemoglobin: 11.8 g/dL (ref 11.1–15.9)

## 2019-09-08 LAB — CMP14+EGFR
ALT: 11 IU/L (ref 0–32)
AST: 17 IU/L (ref 0–40)
Albumin/Globulin Ratio: 1.7 (ref 1.2–2.2)
Albumin: 4.2 g/dL (ref 3.8–4.8)
Alkaline Phosphatase: 114 IU/L (ref 39–117)
BUN/Creatinine Ratio: 29 — ABNORMAL HIGH (ref 12–28)
BUN: 24 mg/dL (ref 8–27)
Bilirubin Total: 0.7 mg/dL (ref 0.0–1.2)
CO2: 23 mmol/L (ref 20–29)
Calcium: 9.2 mg/dL (ref 8.7–10.3)
Chloride: 98 mmol/L (ref 96–106)
Creatinine, Ser: 0.82 mg/dL (ref 0.57–1.00)
GFR calc Af Amer: 84 mL/min/{1.73_m2} (ref >59)
GFR calc non Af Amer: 73 mL/min/{1.73_m2} (ref >59)
Globulin, Total: 2.5 g/dL (ref 1.5–4.5)
Glucose: 167 mg/dL — ABNORMAL HIGH (ref 65–99)
Potassium: 4.2 mmol/L (ref 3.5–5.2)
Sodium: 138 mmol/L (ref 134–144)
Total Protein: 6.7 g/dL (ref 6.0–8.5)

## 2019-09-08 LAB — BAYER DCA HB A1C WAIVED: HB A1C (BAYER DCA - WAIVED): 7.8 % — ABNORMAL HIGH (ref <7.0)

## 2019-09-08 LAB — LIPID PANEL
Chol/HDL Ratio: 3.9 ratio (ref 0.0–4.4)
Cholesterol, Total: 145 mg/dL (ref 100–199)
HDL: 37 mg/dL — ABNORMAL LOW (ref >39)
LDL Chol Calc (NIH): 81 mg/dL (ref 0–99)
Triglycerides: 156 mg/dL — ABNORMAL HIGH (ref 0–149)
VLDL Cholesterol Cal: 27 mg/dL (ref 5–40)

## 2019-09-08 MED ORDER — ERYTHROMYCIN 5 MG/GM OP OINT: 1.0000 "application " | TOPICAL_OINTMENT | Freq: Three times a day (TID) | OPHTHALMIC | 0 refills | Status: AC

## 2019-09-08 MED ORDER — TIZANIDINE HCL 4 MG PO TABS: 2.0000 mg | ORAL_TABLET | Freq: Three times a day (TID) | ORAL | 0 refills | Status: DC | PRN

## 2019-09-08 NOTE — Progress Notes (Signed)
BP 131/63   Pulse 65   Temp 97.6 F (36.4 C) (Temporal)   Ht 5' 2"  (1.575 m)   Wt 100.8 kg   BMI 40.64 kg/m    Subjective:   Patient ID: Mamie Levers, female    DOB: 1951-04-24, 69 y.o.   MRN: [id number]  HPI: Shirley Decamp is a 69 y.o. female presenting on 09/08/2019 for Neck Pain (Radiating to right shoulder) Stye on lower left eyelid, and T2DM Follow Up.  1. Neck Pain: Ms. Lehnert's neck pain began approximately 1 week ago. It woke her up at night, resolved, but then returned approximately 1-2 days later. The pain radiates from the base of her skull down the right side of her neck and into the back of her right shoulder.She denies numbness, tingling, or an electrical shock sensation. She denies arm pain or loss of function. She has tried heat and Advil, both of which have helped with the pain.  2. Stye: Ms. Yamada's stye started last week. The bump is on her lower left eyelid, looked like a pimple, and was painful. She sterilized a needle with heat and alcohol and punctured the stye. Per patient, some pus came out of the stye. Since that time, the pain has improved but the bump is still present.  She has been applying heat to the affected area with some improvement.  3. Type 2 Diabetes w/hypertension and hyperlipidemia: Ms. Bredeson reports that she is not consistently checking her blood glucose levels at home. She has no other concerns regarding her blood sugars, hypertension, or hyperlipidemia. Last eye exam: 11/29/2018 Last foot exam: UTD Last A1C: Will check today.  Relevant past medical, surgical, family and social history reviewed and updated as indicated. Interim medical history since our last visit reviewed. Allergies and medications reviewed and updated.  Review of Systems  HEENT: + red bump on left lower eyelid, + crustiness/drainage from the bump, - loss of vision, - blurry vision, - eye pain, - pain with eye motion MSK: + neck pain, + posterior  shoulder pain, - arm pain, - decreased arm strength  Per HPI unless specifically indicated above   Allergies as of 09/08/2019      Reactions   Quinapril Hcl Cough   Statins Other (See Comments)   Not all Statins but some cause cough and pain in legs.   Tape Other (See Comments)   Redness, please use "paper" tape      Medication List       Accurate as of September 08, 2019 10:13 AM. If you have any questions, ask your nurse or doctor.        aspirin EC 81 MG tablet Take by mouth.   blood glucose meter kit and supplies Dispense based on patient and insurance preference. Use up to four times daily as directed. E11.9   chlorthalidone 25 MG tablet Commonly known as: HYGROTON TAKE 1 TABLET BY MOUTH EVERY DAY   CRANBERRY PO Take 1 tablet by mouth 2 (two) times daily.   diltiazem 180 MG 24 hr capsule Commonly known as: CARDIZEM CD TAKE 1 CAPSULE BY MOUTH EVERY DAY   diltiazem 30 MG tablet Commonly known as: CARDIZEM TAKE 1 TABLET BY MOUTH EVERY DAY What changed:   how much to take  how to take this  when to take this  additional instructions   erythromycin ophthalmic ointment Place 1 application into the left eye 3 (three) times daily for 7 days. Started by: Ronnie Doss, DO   ezetimibe  10 MG tablet Commonly known as: ZETIA TAKE 1 TABLET BY MOUTH EVERY DAY   flecainide 150 MG tablet Commonly known as: TAMBOCOR TAKE 0.5 TABLETS (75 MG TOTAL) BY MOUTH 2 (TWO) TIMES DAILY.   furosemide 20 MG tablet Commonly known as: Lasix Take 1 tablet (20 mg total) by mouth daily as needed for fluid.   glipiZIDE 5 MG tablet Commonly known as: GLUCOTROL Take 1 tablet (5 mg total) by mouth 2 (two) times daily.   glucose blood test strip Commonly known as: Accu-Chek Guide USE TO CHECK BLOOD GLUCOSE ONCE DAILY Dx E11.9   Jardiance 25 MG Tabs tablet Generic drug: empagliflozin Take 25 mg by mouth daily before breakfast.   ketoconazole 2 % cream Commonly known as:  NIZORAL Apply 1 application topically daily. x7-10 days (for skin yeast)   Lancet Device Misc Use to test sugar once daily E11.9   levothyroxine 112 MCG tablet Commonly known as: SYNTHROID TAKE 1 TABLET BY MOUTH EVERY OTHER DAY, ALTERNATING WITH 1 & 1/2 TABLETS EVERY OTHER DAY   losartan 25 MG tablet Commonly known as: COZAAR Take 1 tablet (25 mg total) by mouth daily.   lovastatin 40 MG tablet Commonly known as: MEVACOR TAKE 1 TABLET BY MOUTH EVERY DAY IN THE EVENING   metFORMIN 1000 MG tablet Commonly known as: GLUCOPHAGE Take 1 tablet (1,000 mg total) by mouth 2 (two) times daily with a meal.   metoprolol succinate 100 MG 24 hr tablet Commonly known as: TOPROL-XL Take 1 tablet (100 mg total) by mouth daily. Take with or immediately following a meal.   PROBIOTIC-10 PO Take by mouth.   rOPINIRole 0.5 MG tablet Commonly known as: REQUIP TAKE 1 TABLET (0.5 MG TOTAL) BY MOUTH AT BEDTIME.   tiZANidine 4 MG tablet Commonly known as: Zanaflex Take 0.5-1 tablets (2-4 mg total) by mouth every 8 (eight) hours as needed for muscle spasms. Started by: Ronnie Doss, DO   triamcinolone cream 0.1 % Commonly known as: KENALOG Apply 1 application topically 2 (two) times daily. To neck x7-10d        Objective:   BP 131/63   Pulse 65   Temp 97.6 F (36.4 C) (Temporal)   Ht 5' 2"  (1.575 m)   Wt 100.8 kg   BMI 40.64 kg/m   Wt Readings from Last 3 Encounters:  09/08/19 100.8 kg  08/29/19 103.2 kg  06/13/19 102.1 kg    Physical Exam   General: Patient appears WDWN, stated age, and in no acute distress. HEENT: + erythematous stye on left lower eyelid, no purulence.  Mild soft tissue swelling.  No significant orbital involvement.  No pain with EOM. no decreased ocular ROM Cardiovascular: Bradycardic w/regular rhythm, S1S2 heard; 2/6 systolic ejection murmur noted bilateral sternal borders. Pulm: CTAB, no wheezes, rhonchi or rales; normal work of breathing on room  air. MSK: + right-sided trapezius tenderness with associated increased tonicity, + decreased cervical ROM, no trapezius atrophy   Results for orders placed or performed in visit on 06/13/19  Bayer DCA Hb A1c Waived  Result Value Ref Range   HB A1C (BAYER DCA - WAIVED) 8.3 (H) <7.0 %  Thyroid Panel With TSH  Result Value Ref Range   TSH 3.160 0.450 - 4.500 uIU/mL   T4, Total 9.5 4.5 - 12.0 ug/dL   T3 Uptake Ratio 28 24 - 39 %   Free Thyroxine Index 2.7 1.2 - 4.9    Assessment & Plan:   1. Trapezius Muscle Spasm: Zanaflex 2-4  mg total (0.5-1 tablets) every 8 hours PRN for trapezius muscle spasm. Recommend Aleve/Naproxen PRN and heat for pain relief.  2. Hordeolum Externa of Left Lower Eyelid: Erythromycin ophthalmic ointment TID x 1 week.  3. Diabetes mellitus without complication ([place]): Continue current DM regimen pending results of HgA1C today.  4. Hyperlipidemia associated with type 2 diabetes mellitus ([place]): Continue statin. Will check CMP14 + EGFR and Lipid Panel today.  5. Hypertension associated with diabetes ([place]): BP is controlled. Continue current BP med regimen. Will check CMP14 + EGFR today.   Problem List Items Addressed This Visit      Cardiovascular and Mediastinum   Hypertension associated with diabetes ([place])   Relevant Orders   CMP14+EGFR     Endocrine   Diabetes mellitus without complication ([place])   Relevant Orders   Bayer DCA Hb A1c Waived   Hyperlipidemia associated with type 2 diabetes mellitus ([place])   Relevant Orders   CMP14+EGFR   Lipid Panel    Other Visit Diagnoses    Trapezius muscle spasm    -  Primary   Relevant Medications   tiZANidine (ZANAFLEX) 4 MG tablet   Hordeolum externum of left lower eyelid           Follow up plan:  1. Hordeolum Externum of Left Lower Eyelid: Follow up at Urgent Care if stye increases in size and/or pain over the weekend. If orbital swelling or if pain with ocular ROM occurs, go to the ER.  2. Type 2  Diabetes Mellitus with hypertension and hyperlipidemia: Follow up in 3 months for next Diabetes check up. Will recheck HgA1C at this visit.  Orders Placed This Encounter  Procedures  . Bayer DCA Hb A1c Waived  . CMP14+EGFR  . Lipid Panel    Gaynelle Arabian, PA-S2 [facility] 09/08/2019, 10:13 AM

## 2019-09-08 NOTE — Addendum Note (Signed)
Addended by: Janora Norlander on: 09/08/2019 01:00 PM   Modules accepted: Orders

## 2019-09-08 NOTE — Patient Instructions (Signed)
You had labs performed today.  You will be contacted with the results of the labs once they are available, usually in the next 3 business days for routine lab work.  If you have an active my chart account, they will be released to your MyChart.  If you prefer to have these labs released to you via telephone, please let us know.  If you had a pap smear or biopsy performed, expect to be contacted in about 7-10 days.   Stye  A stye, also known as a hordeolum, is a bump that forms on an eyelid. It may look like a pimple next to the eyelash. A stye can form inside the eyelid (internal stye) or outside the eyelid (external stye). A stye can cause redness, swelling, and pain on the eyelid. Styes are very common. Anyone can get them at any age. They usually occur in just one eye, but you may have more than one in either eye. What are the causes? A stye is caused by an infection. The infection is almost always caused by bacteria called Staphylococcus aureus. This is a common type of bacteria that lives on the skin. An internal stye may result from an infected oil-producing gland inside the eyelid. An external stye may be caused by an infection at the base of the eyelash (hair follicle). What increases the risk? You are more likely to develop a stye if:  You have had a stye before.  You have any of these conditions: ? Diabetes. ? Red, itchy, inflamed eyelids (blepharitis). ? A skin condition such as seborrheic dermatitis or rosacea. ? High fat levels in your blood (lipids). What are the signs or symptoms? The most common symptom of a stye is eyelid pain. Internal styes are more painful than external styes. Other symptoms may include:  Painful swelling of your eyelid.  A scratchy feeling in your eye.  Tearing and redness of your eye.  Pus draining from the stye. How is this diagnosed? Your health care provider may be able to diagnose a stye just by examining your eye. The health care provider  may also check to make sure:  You do not have a fever or other signs of a more serious infection.  The infection has not spread to other parts of your eye or areas around your eye. How is this treated? Most styes will clear up in a few days without treatment or with warm compresses applied to the area. You may need to use antibiotic drops or ointment to treat an infection. In some cases, if your stye does not heal with routine treatment, your health care provider may drain pus from the stye using a thin blade or needle. This may be done if the stye is large, causing a lot of pain, or affecting your vision. Follow these instructions at home:  Take over-the-counter and prescription medicines only as told by your health care provider. This includes eye drops or ointments.  If you were prescribed an antibiotic medicine, apply or use it as told by your health care provider. Do not stop using the antibiotic even if your condition improves.  Apply a warm, wet cloth (warm compress) to your eye for 5-10 minutes, 4 times a day.  Clean the affected eyelid as directed by your health care provider.  Do not wear contact lenses or eye makeup until your stye has healed.  Do not try to pop or drain the stye.  Do not rub your eye. Contact a health care  provider if:  You have chills or a fever.  Your stye does not go away after several days.  Your stye affects your vision.  Your eyeball becomes swollen, red, or painful. Get help right away if:  You have pain when moving your eye around. Summary  A stye is a bump that forms on an eyelid. It may look like a pimple next to the eyelash.  A stye can form inside the eyelid (internal stye) or outside the eyelid (external stye). A stye can cause redness, swelling, and pain on the eyelid.  Your health care provider may be able to diagnose a stye just by examining your eye.  Apply a warm, wet cloth (warm compress) to your eye for 5-10 minutes, 4 times  a day. This information is not intended to replace advice given to you by your health care provider. Make sure you discuss any questions you have with your health care provider. Document Revised: 04/30/2017 Document Reviewed: 01/28/2017 Elsevier Patient Education  2020 [employer].

## 2019-09-12 ENCOUNTER — Other Ambulatory Visit: Payer: Self-pay

## 2019-09-12 ENCOUNTER — Ambulatory Visit (INDEPENDENT_AMBULATORY_CARE_PROVIDER_SITE_OTHER): Payer: Medicare Other | Admitting: Family Medicine

## 2019-09-12 ENCOUNTER — Encounter: Payer: Self-pay | Admitting: Family Medicine

## 2019-09-12 VITALS — BP 135/63 | HR 58 | Temp 97.3°F | Ht 62.0 in | Wt 222.0 lb

## 2019-09-12 DIAGNOSIS — H019 Unspecified inflammation of eyelid: Secondary | ICD-10-CM

## 2019-09-12 DIAGNOSIS — E1165 Type 2 diabetes mellitus with hyperglycemia: Secondary | ICD-10-CM

## 2019-09-12 MED ORDER — DOXYCYCLINE HYCLATE 100 MG PO TABS: 100.0000 mg | ORAL_TABLET | Freq: Two times a day (BID) | ORAL | 0 refills | Status: DC

## 2019-09-12 NOTE — Patient Instructions (Signed)
Sugar improving but still high.  Almyra Free, our pharmacist, will be calling you to discuss medications.

## 2019-09-12 NOTE — Progress Notes (Signed)
Subjective: CC: Follow-up blepharitis/stye PCP: Janora Norlander, DO Emily [name] is a 69 y.o. female presenting to clinic today for:  1.  Blepharitis/stye Patient was seen several days ago and started empirically on erythromycin antibiotic topically for a left lower lid stye which appeared inflamed.  She has been compliant with this ointment and does feel that the pain is getting better but the redness and swelling seems to be worsening.  No change in vision, no pain with eye movement.  No fevers.  2.  Diabetes We reviewed her labs today and this showed improving A1c but still elevated.  She is compliant with her medications.  She has not been yet contacted by the pharmacist to talk about medication management but is open to this.  Does not report any chest pain, shortness of breath.   ROS: Per HPI  Allergies  Allergen Reactions  . Quinapril Hcl Cough  . Statins Other (See Comments)    Not all Statins but some cause cough and pain in legs.  . Tape Other (See Comments)    Redness, please use "paper" tape   Past Medical History:  Diagnosis Date  . Anemia   . Arthritis   . Atrial fibrillation, rapid ([place]) 10/27/2013  . Bilateral carotid artery disease ([place])   . Diabetes mellitus without complication ([place])   . Difficult intubation    states 'lady that did the sleep study told me I have the smallest airway she has ever seen in an adult"  . Dyslipidemia 06/29/2017  . Dysrhythmia   . Hypertension   . Hypothyroid   . Obesity   . Obstructive sleep apnea    on C Pap  . PAF (paroxysmal atrial fibrillation) ([place])    a. newly dx in 09/2013; on eliquis    Current Outpatient Medications:  .  aspirin EC 81 MG tablet, Take by mouth., Disp: , Rfl:  .  blood glucose meter kit and supplies, Dispense based on patient and insurance preference. Use up to four times daily as directed. E11.9, Disp: 1 each, Rfl: 0 .  chlorthalidone (HYGROTON) 25 MG tablet, TAKE 1 TABLET BY MOUTH EVERY  DAY, Disp: 90 tablet, Rfl: 1 .  CRANBERRY PO, Take 1 tablet by mouth 2 (two) times daily., Disp: , Rfl:  .  diltiazem (CARDIZEM CD) 180 MG 24 hr capsule, TAKE 1 CAPSULE BY MOUTH EVERY DAY, Disp: 90 capsule, Rfl: 1 .  diltiazem (CARDIZEM) 30 MG tablet, TAKE 1 TABLET BY MOUTH EVERY DAY (Patient taking differently: Take only when heart is out of rhythm), Disp: 90 tablet, Rfl: 1 .  empagliflozin (JARDIANCE) 25 MG TABS tablet, Take 25 mg by mouth daily before breakfast., Disp: 30 tablet, Rfl: 11 .  erythromycin ophthalmic ointment, Place 1 application into the left eye 3 (three) times daily for 7 days., Disp: 3.5 g, Rfl: 0 .  ezetimibe (ZETIA) 10 MG tablet, TAKE 1 TABLET BY MOUTH EVERY DAY, Disp: 90 tablet, Rfl: 1 .  flecainide (TAMBOCOR) 150 MG tablet, TAKE 0.5 TABLETS (75 MG TOTAL) BY MOUTH 2 (TWO) TIMES DAILY., Disp: 90 tablet, Rfl: 3 .  furosemide (LASIX) 20 MG tablet, Take 1 tablet (20 mg total) by mouth daily as needed for fluid., Disp: 90 tablet, Rfl: 0 .  glipiZIDE (GLUCOTROL) 5 MG tablet, Take 1 tablet (5 mg total) by mouth 2 (two) times daily., Disp: 180 tablet, Rfl: 3 .  glucose blood (ACCU-CHEK GUIDE) test strip, USE TO CHECK BLOOD GLUCOSE ONCE DAILY Dx E11.9, Disp: 100  each, Rfl: 3 .  ketoconazole (NIZORAL) 2 % cream, Apply 1 application topically daily. x7-10 days (for skin yeast), Disp: 60 g, Rfl: 0 .  Lancet Device MISC, Use to test sugar once daily E11.9, Disp: 100 each, Rfl: 12 .  levothyroxine (SYNTHROID) 112 MCG tablet, TAKE 1 TABLET BY MOUTH EVERY OTHER DAY, ALTERNATING WITH 1 & 1/2 TABLETS EVERY OTHER DAY, Disp: 112 tablet, Rfl: 3 .  losartan (COZAAR) 25 MG tablet, Take 1 tablet (25 mg total) by mouth daily., Disp: 90 tablet, Rfl: 2 .  lovastatin (MEVACOR) 40 MG tablet, TAKE 1 TABLET BY MOUTH EVERY DAY IN THE EVENING, Disp: 90 tablet, Rfl: 3 .  metFORMIN (GLUCOPHAGE) 1000 MG tablet, Take 1 tablet (1,000 mg total) by mouth 2 (two) times daily with a meal., Disp: 180 tablet, Rfl: 3 .   metoprolol succinate (TOPROL-XL) 100 MG 24 hr tablet, Take 1 tablet (100 mg total) by mouth daily. Take with or immediately following a meal., Disp: 90 tablet, Rfl: 3 .  Probiotic Product (PROBIOTIC-10 PO), Take by mouth., Disp: , Rfl:  .  rOPINIRole (REQUIP) 0.5 MG tablet, TAKE 1 TABLET (0.5 MG TOTAL) BY MOUTH AT BEDTIME., Disp: 90 tablet, Rfl: 0 .  triamcinolone cream (KENALOG) 0.1 %, Apply 1 application topically 2 (two) times daily. To neck x7-10d, Disp: 30 g, Rfl: 0  Current Facility-Administered Medications:  .  cyanocobalamin ((VITAMIN B-12)) injection 1,000 mcg, 1,000 mcg, Intramuscular, Q30 days, Timmothy Euler, MD, 1,000 mcg at 08/15/19 1040 Social History   Socioeconomic History  . Marital status: Single    Spouse name: Not on file  . Number of children: Not on file  . Years of education: Not on file  . Highest education level: Not on file  Occupational History  . Not on file  Tobacco Use  . Smoking status: Never Smoker  . Smokeless tobacco: Never Used  Substance and Sexual Activity  . Alcohol use: No    Alcohol/week: 0.0 standard drinks  . Drug use: Never  . Sexual activity: Never  Other Topics Concern  . Not on file  Social History Narrative  . Not on file   Social Determinants of Health   Financial Resource Strain:   . Difficulty of Paying Living Expenses:   Food Insecurity:   . Worried About Charity fundraiser in the Last Year:   . Arboriculturist in the Last Year:   Transportation Needs:   . Film/video editor (Medical):   Marland Kitchen Lack of Transportation (Non-Medical):   Physical Activity:   . Days of Exercise per Week:   . Minutes of Exercise per Session:   Stress:   . Feeling of Stress :   Social Connections:   . Frequency of Communication with Friends and Family:   . Frequency of Social Gatherings with Friends and Family:   . Attends Religious Services:   . Active Member of Clubs or Organizations:   . Attends Archivist Meetings:   Marland Kitchen  Marital Status:   Intimate Partner Violence:   . Fear of Current or Ex-Partner:   . Emotionally Abused:   Marland Kitchen Physically Abused:   . Sexually Abused:    Family History  Problem Relation Age of Onset  . COPD Father   . Heart failure Father   . Heart disease Father   . Emphysema Father   . Arrhythmia Sister   . Arrhythmia Sister   . Arrhythmia Sister  had PPM also  . Cancer Sister     Objective: Office vital signs reviewed. BP 135/63   Pulse (!) 58   Temp (!) 97.3 F (36.3 C)   Ht 5' 2"  (1.575 m)   Wt 222 lb (100.7 kg)   SpO2 96%   BMI 40.60 kg/m   Physical Examination:  General: Awake, alert, well nourished, No acute distress HEENT: left lower lid with increased redness and soft tissue swelling.  EOMI.  PERRLA.  No pain with extraocular movement Extremities: warm, well perfused, No edema, cyanosis or clubbing; +2 pulses bilaterally Neuro: see DM foot Diabetic Foot Exam - Simple   Simple Foot Form Diabetic Foot exam was performed with the following findings: Yes 09/12/2019 11:16 AM  Visual Inspection No deformities, no ulcerations, no other skin breakdown bilaterally: Yes Sensation Testing Intact to touch and monofilament testing bilaterally: Yes Pulse Check Posterior Tibialis and Dorsalis pulse intact bilaterally: Yes Comments     Assessment/ Plan: 69 y.o. female   1. Infection of eyelid Start doxycycline.  Okay to use erythromycin if this provides comfort.  Okay to continue warm compresses.  We discussed red flag signs and symptoms warranting further evaluation - doxycycline (VIBRA-TABS) 100 MG tablet; Take 1 tablet (100 mg total) by mouth 2 (two) times daily.  Dispense: 20 tablet; Refill: 0  2. Uncontrolled type 2 diabetes mellitus with hyperglycemia (HCC) Foot exam performed.  We reviewed her labs.  A1c improving but still elevated.  Awaiting call from pharmacist.  We will see how we can maximize her A1c reduction by minimizing number of medications.   Ideally would like to keep her on Jardiance given heart history.   No orders of the defined types were placed in this encounter.  No orders of the defined types were placed in this encounter.    Janora Norlander, DO [place] [phone]

## 2019-09-13 NOTE — Progress Notes (Signed)
Patient scheduled to see PharmD on 09/19/19.  Thank you!

## 2019-09-14 ENCOUNTER — Other Ambulatory Visit: Payer: Self-pay | Admitting: Physician Assistant

## 2019-09-18 ENCOUNTER — Other Ambulatory Visit: Payer: Self-pay | Admitting: Family Medicine

## 2019-09-18 ENCOUNTER — Telehealth: Payer: Self-pay | Admitting: Family Medicine

## 2019-09-18 DIAGNOSIS — R195 Other fecal abnormalities: Secondary | ICD-10-CM

## 2019-09-18 NOTE — Telephone Encounter (Signed)
done

## 2019-09-18 NOTE — Telephone Encounter (Signed)
Left detailed message.   

## 2019-09-18 NOTE — Telephone Encounter (Signed)
Pt coming in tomorrow for B12 Shot and would like for Dr Lajuana Ripple to put in an order for her to get a stool sample while shes here. Pt says her stool is dark and she wants to check to see about blood in stool.

## 2019-09-19 ENCOUNTER — Ambulatory Visit (INDEPENDENT_AMBULATORY_CARE_PROVIDER_SITE_OTHER): Payer: Medicare Other

## 2019-09-19 ENCOUNTER — Other Ambulatory Visit: Payer: Self-pay

## 2019-09-19 ENCOUNTER — Ambulatory Visit (INDEPENDENT_AMBULATORY_CARE_PROVIDER_SITE_OTHER): Payer: Medicare Other | Admitting: Pharmacist

## 2019-09-19 VITALS — BP 140/75 | HR 80

## 2019-09-19 DIAGNOSIS — R195 Other fecal abnormalities: Secondary | ICD-10-CM | POA: Diagnosis not present

## 2019-09-19 DIAGNOSIS — E538 Deficiency of other specified B group vitamins: Secondary | ICD-10-CM

## 2019-09-19 DIAGNOSIS — E1165 Type 2 diabetes mellitus with hyperglycemia: Secondary | ICD-10-CM | POA: Diagnosis not present

## 2019-09-19 NOTE — Progress Notes (Signed)
Cyanocobalamin injection given to right deltoid.  Patient tolerated well. 

## 2019-09-20 LAB — FECAL OCCULT BLOOD, IMMUNOCHEMICAL: Fecal Occult Bld: POSITIVE — AB

## 2019-09-21 ENCOUNTER — Telehealth: Payer: Self-pay | Admitting: Family Medicine

## 2019-09-21 ENCOUNTER — Encounter: Payer: Self-pay | Admitting: *Deleted

## 2019-09-21 DIAGNOSIS — Z006 Encounter for examination for normal comparison and control in clinical research program: Secondary | ICD-10-CM

## 2019-09-21 NOTE — Research (Signed)
COORDINATE-Diabetes 12 Month CASE REPORT FORM (Intervention) -EHR Site #:   [id number]              Patient ID:           [id number]   12 MONTH EHR REVIEW  Medical Record Check Date 09/12/2019   Vital Status [x] Patient Alive >Date last known alive per EHR:   [] Patient Dead >> Complete Death Form  [] Unknown   CLINICAL EVENTS / PROCEDURES  Hospitalization since last visit? (>=24 hour stay) [x] No [] Yes  >> if yes, Complete the following  Date of hospital admission:        / /             MM DD YYYY  Primary discharge diagnosis:  *Complete appropriate event validation form [] acute myocardial infarction (heart attack)* [] stroke* [] heart failure* [] coronary revascularization* [] peripheral revascularization* [] cerebral revascularization* [] diabetes (e.g. hypoglycemia, DKA) [] renal failure [] amputation [] other cardiovascular reason [] other NON-cardiovascular reason [] unknown  Other diagnoses not documented above: (check all that apply)  *Complete appropriate event validation form [] acute myocardial infarction (heart attack)* [] stroke* [] heart failure* [] coronary revascularization* [] peripheral revascularization* [] cerebral revascularization* [] diabetes (e.g. hypoglycemia, DKA) [] renal failure [] amputation [] other cardiovascular reason [] other NON-cardiovascular reason [] unknown  Were any of the following outpatient procedures done since the last visit? (I.e. procedures not captured above)  Coronary revascularization [x] No [] Yes >IF YES, Date / /             MM DD YYYY  Peripheral revascularization [x] No [] Yes > IF YES, Date / /             MM DD YYYY  Cerebral revascularization [x] No [] Yes > IF YES, Date / /             MM DD YYYY  Extremity amputation    [x] No [] Yes >IF YES, Date / /             MM      DD       YYYY  Renal replacement therapy (i.e. dialysis)    [x] No [] Yes > IF YES, Date of initiation / /             MM      DD       YYYY   **EDC will allow for collection  of multiple hospitalizations and procedures   MEDICATIONS  Medication Currently Prescribed? If started since last visit: If not started since last visit: If stopped since last visit:  Cardiac Medications  ACE Inhibitor / Angiotensin Receptor Blocker (ARB) / Angiotensin Receptor Neprilysin inhibitor (ARNi)  [] No >  [] Yes > Since last visit, medication was: [] Stopped [] Not started [] Started [x] Continued same medication [] Continued with medication changes Date started:        / /             MM DD YYYY Who prescribed? [] Cardiology provider  [] Study clinic [] Outside clinic [] Endocrinology provider [] Primary care provider [] Other provider  Specify:                             [] Unknown Reason (check all that apply): [] History of swelling around lips, eyes or face [] Feeling dizzy/lightheaded [] Low blood pressure [] Poor or fluctuating kidney function [] High potassium [] Patient has experienced other side effects to this medication  before [] Patient will be unable to adhere/monitor [] Patient unable to afford it [] Patient does not want to      take this medication [] Pregnancy [] Other (specify: ) [] Unknown Reason  Date discontinued:        / /             MM DD YYYY Reason (check all that apply): [] Swelling around lips, eyes       or face [] Feeling dizzy/lightheaded [] Low blood pressure [] Poor or fluctuating kidney function [] High potassium [] Other medication side       effects [] Patient unable to        adhere/monitor [] Had an       operation/procedure that        required stopping it [] Patient unable to afford it [] Patient no longer wants       to take this medication [] Pregnancy [] Other (specify: ) [] Unknown Reason   If started or changed  Medication Name: [] Benazepril (Lotensin) [] Captopril (Capoten) [] Enalapril (Vasotec) [] Fosinopril (Monopril) [] Lisinopril (Zestril, Prinivil) [] Quinapril (Accupril) [] Ramipril (Altace) [] Azilsartan  (Edarbi) [] Candesartan (Atacand) [] Irbesartan (Avapro) [] Losartan (Cozaar) [] Olmesartan (Benicar) [] Telmisartan (Micardis) [] Valsartan (Diovan) [] Sacrubitril/Valsartan (Entresto)     Beta Blocker [] No [x]  Yes > [] Acebutolol (Sectral) [] Bisoprolol (Zebeta) [] Carvedilol (Coreg) [] Labetalol (Trandate,      Normodyne) [x] Metoprolol succinate (Toprol) [] Metoprolol tartrate       (Lopressor) [] Nadolol (Corgard) [] Nebivolol (Bystolic) [] Propranolol (Inderal) [] Sotalol (Betapace)     Medication Currently Prescribed? If started since last visit: If not started since last visit: If stopped since last visit:  Aldosterone Antagonist [x] No [] Yes > [] Amiloride [] Eplerenone (Inspra) [] Spirinolactone (Aldactone) [] Traimterene (Dyrenium)   Calcium Channel Blocker [x] No []  Yes > Medication Name: [] Amlodipine (Norvasc) [] Diltiazem (Cardizem) [] Felodipine (Plendil) [] Nifedipine (Procardia) [] Verapamil (Calan)   Diuretic Loop [x] No [x]  Yes > Medication Name: [] Bumetanide (Bumex) [] Ethacrynic acid (Edecrin) [x] Furosemide (Lasix) [] Torsemide (Demadex)   Diuretic Thiazide- type [x] No []  Yes > Medication Name: [] Chlorothiazide      [] Chlorthalidone [] Hydrochlorothiazide [] Indapamide [] Metolazone   Anticoagulation Therapy (other than Warfarin) [x] No []  Yes > Medication Name: [] Apixaban (Eliquis) [] Edoxaban (Lixiana) [] Rivaroxaban Alen Blew) [] Dabigatran (Redaxa)   Warfarin [x] No []  Yes    Antiplatelet Agent (including aspirin) [] No []  Yes > Medication Name (check all that apply): [x] Aspirin [] Clopidogrel (Plavix) [] Prasugrel (Effient) [] Ticagrelor (Brilinta) [] Ticlopidine (Ticlid) [] Dipyridamole (Persantine)    Medication Currently Prescribed? If started since last visit: If not started since last visit: If stopped since last visit:  Statin  [] No >  [x] Yes > Since last visit, medication was: [] Stopped [] Not started [] Started [x] Continued same  medication and dose [] Continued with dose or medication changes Date started:        / /             MM DD YYYY Who prescribed? [] Cardiology provider [] Endocrinology provider [] Primary care provider [] Other provider  Specify:                             [] Unknown Reason (check all that apply): [] History of Rhabdomyolysis [] LDL-cholesterol already       <70 [] Muscle      aches/pain/weakness [] Mental       fogginess/memory loss [] Liver dysfunction [] Patient has  experienced other side   effects to this medication before [] Patient will be unable to adhere/monitor [] Patient unable to afford it [] Patient does not want to take this medication [] Pregnancy [] Other (specify: ) [] Unknown Reason Date discontinued:        / /             MM DD YYYY Reason (check all that apply): [] Rhabdomyolysis [] Muscle aches/pain/weakness [] Mental fogginess/memory loss [] Liver dysfunction [] Other medication side effects [] Patient unable to  adhere/monitor [] Patient unable to afford it [] Patient no longer wants to take       this medication [] Pregnancy [] Other (specify: ) [] Unknown Reason   If started or changed  Medication Name: [] Atorvastatin (Lipitor) [] Fluvastatin (Lescol) [] Lovastatin (Mevacor) [] Pravastatin (Pravachol) [] Rosuvastatin (Crestor) [] Simvastatin (Zocor) [] Pitatavastatin (Livalo)  Dose: [] 1 mg [] 10 mg [] 2 mg []  20 mg [] 3 mg []  40 mg [] 4 mg []  60 mg [] 5 mg []  80 mg  Frequency: [] Daily  [] Less than daily      Does the patient have statin intolerance that prevents the use of maximum dose of high potency statin? [] No [] Yes >IF YES, Complete Statin Intolerance form   Non-statin lipid lowering therapy [] No [x] Yes > Medication Name (check all that apply): [] Colesevelam (Welchol) [x] Ezetimibe (Zetia) [] Fibrate [] Niacin [] PCSK9 inhibitor [] Omega 3 acid ethyl esters (Lovaza) [] Icosapent Ethyl (Vascepa) [] Over the counter omega 3 fatty acid or fish  oil supplement     Medication Currently Prescribed? If started since last visit: If not started since last visit: If stopped since last visit:  Diabetes Medications  SGLT2 Inhibitor  [] No >  [x] Yes > Since last visit, medication was: [] Stopped [] Not started [] Started [x] Continued same medication [] Continued with medication changes Date started:        / /             MM DD YYYY Who prescribed? [] Cardiology provider  [] Study clinic [] Outside clinic [] Endocrinology      provider [] Primary care provider [] Other provider  Specify:                             [] Unknown Reason (check all that apply): [] eGFR <45 [] HbA1c<7% on metformin monotherapy OR already on GLP1RA and do not need to start another anti- hyperglycemic [] Already dehydrated [] Low blood pressure [] High risk of Hypoglycemia [] Prior DKA [] Recurrent mycotic genital infections [] History of or at risk for amputation [] Patient has experienced other side effects to this medication before [] Patient will be unable to adhere/monitor [] Patient unable to afford it [] Patient does not want to take this medication [] Pregnancy [] Other (specify: ) [] Unknown Reason Date discontinued:        / /             MM DD YYYY Reason (check all that apply  [] eGFR now <45 [] Dehydration [] Low blood pressure [] Hypoglycemia [] DKA [] Mycotic genital infection [] Amputation [] Other medication side effects [] Patient unable    to adhere/monitor  [] Had an operation/procedure     that required stopping it [] Patient unable to afford it [] Patient no longer wants to      take this medication [] Pregnancy [] Other (specify: ) [] Unknown Reason   If started or changed  Medication Name: [] Canaglifozin (Invokana) [] Dapagliflozin Wilder Glade) [] Empaglifozin (Jardiance) [] Ertugliflozin Actuary)           Medication Currently Prescribed? If started since last visit: If not started since last visit: If stopped since last visit:   GLP1 Receptor Agonist  [x] No >  [] Yes > Since last visit, medication was: [] Stopped [] Not started [] Started [] Continued same medication [] Continued with medication changes Date started:        / /             MM DD YYYY Who prescribed? [] Cardiology provider  [] Study clinic [] Outside clinic [] Endocrinology       provider [] Primary care provider [] Other provider > Specify:                             []   Unknown Reason (check all that apply): [] Personal or family history of medullary thyroid cancer [] MEN2 [] HbA1c<7% on metformin monotherapy OR already on SGLT2i and do not need to start another anti-hyperglycemic [] eGFR now <30 [] High risk of Hypoglycemia [] History of pancreatitis [] Significant gastroparesis [] Prior gastric surgery [] Patient has experienced other side effects to this medication before [] Patient will be unable to adhere/monitor [] Patient unable to afford it [] Patient does not want to take this medication [] Pregnancy [] Other (specify: ) [] Unknown Reason Date discontinued:        / /             MM DD YYYY Reason (check all that apply): [] Medullary thyroid cancer [] MEN2 [] eGFR now <30 [] Hypoglycemia [] Pancreatitis [] Significant gastroparesis [] Gastric surgery [] Other medication side       effects []   Patient  unable    to adhere/monitor                                  [] Had an operation/procedure that required stopping it [] Patient unable to afford it [] Patient no longer wants to take this medication [] Pregnancy [] Other (specify: ) [] Unknown Reason   If started or changed > Medication Name: [] Albiglutide (Tanzeum) [] Dulaglutide (Trulicity) [] Exanatide (Byetta, Bydureon) [] Liraglutide (Victoza, Saxenda) [] Lixisenatide (Adlyxin) [] Semaglutice (Ozempic)      Medication Currently Prescribed? If started since last visit: If not started since last visit: If stopped since last visit:  Other non Insulin diabetes medications [] No [x] Yes >  Medication Name (check all that apply): [] Acarbose (Precose) [] Miglitol (Glyset) [] Glimepiride (Amaryl) [x] Glipizide (Amaryl) [] Glyburide (Diabeta,       Glynase,   Micronase) [x] Metformin (Fortamet,        Glucophage[including XR],        Glumetza, Riomet) [] Pioglitazone (Actos) [] Nateglinide (Starlix) [] Pramlintide (Symilin) [] Repaglinide (Prandin) [] Rosiglitazone (Avandia) [] Alogliptin (Nesina) [] Linagliptin (Tradjenta) [] Saxagliptin (Onglyza) [] Sitagliptin (Januvia) [] Bromocriptine Quick Release (Cycloset)     Insulin [x] No []  Yes > total daily dose: units     STATIN INTOLERANCE (PER EHR/OTHER SOURCE DATA)  1. Was CK checked? [] No [] Yes   >If yes, select from the following: [] CK not elevated [] CK elevated 1-5x upper limit of normal [] CK elevated >5x upper limit of normal  2. Does the patient have muscle symptoms? [] No [] Yes    >If yes, select from the following: Location and pattern of muscle symptoms (select all that apply) [] Symmetric, hip flexors or thighs [] Symmetric, calves [] Symmetric, proximal upper extremity [] Asymmetric, intermittent, or not specific to any area [] Unknown   Timing of muscle symptom in relation to starting statin regimen [] <4 weeks [] 4-12 weeks [] >12 weeks [] Unknown   Timing of muscle symptoms improvement after withdrawal of statin [] <2 weeks [] 2-4 weeks [] No improvement after 4 weeks [] Unknown  3. Was patient re-challenged with a statin regimen (even if same statin compound or regimen as above)?  [] No  [] Yes  [] Unknown  >If yes, select from the following: Timing of recurrence of similar muscle symptoms in relation to starting second regimen [] <4 weeks [] 4-12 weeks [] >12 weeks [] Similar symptoms did not recur [] Unknown   [email or web address]

## 2019-09-21 NOTE — Telephone Encounter (Signed)
Provider has not reviewed

## 2019-09-22 ENCOUNTER — Other Ambulatory Visit: Payer: Self-pay

## 2019-09-22 ENCOUNTER — Other Ambulatory Visit: Payer: Self-pay | Admitting: *Deleted

## 2019-09-22 ENCOUNTER — Other Ambulatory Visit: Payer: Medicare Other

## 2019-09-22 ENCOUNTER — Telehealth: Payer: Self-pay | Admitting: Family Medicine

## 2019-09-22 ENCOUNTER — Telehealth: Payer: Self-pay | Admitting: Cardiology

## 2019-09-22 DIAGNOSIS — K921 Melena: Secondary | ICD-10-CM | POA: Diagnosis not present

## 2019-09-22 DIAGNOSIS — R5383 Other fatigue: Secondary | ICD-10-CM | POA: Diagnosis not present

## 2019-09-22 NOTE — Telephone Encounter (Signed)
Emily [name] is calling stating Dr. Bryn Gulling advised her to reach out to Dr. Percival Spanish to receive approval for continence of care with him. She states she is losing to much blood in her stool. Please advise.

## 2019-09-22 NOTE — Telephone Encounter (Signed)
Requesting results of hemoccult card.

## 2019-09-22 NOTE — Telephone Encounter (Signed)
Spoke with Nurse from Gastroenterology, Lisa Martinique. She reports patient called into their office yesterday about dark stools. She reported fatigues, weakness, black stools and being more tired than usual. Patient also reported that her heart has been beating irregularly. Patient is s/p watchman procedure.   Triage nurse advised patient to call cardiology to report the irregular heart beat and fatigue.   Still awaiting return call from patient, depending on symptoms patient will need to go to the ER or have an a-fib clinic appointment. If no availability she should be seen in office by an APP.

## 2019-09-22 NOTE — Telephone Encounter (Signed)
Follow up  Pt is returning call from Maudie Mercury, she said to call her back on her mobile number

## 2019-09-22 NOTE — Telephone Encounter (Signed)
Aware of results. 

## 2019-09-22 NOTE — Telephone Encounter (Signed)
Left message to please call our office.  Future order for Hgb ordered.  She may come to lab today.

## 2019-09-22 NOTE — Telephone Encounter (Signed)
Left message for patient to call back.   Left message with [facility] for nurse to return call to clarify recommendations.

## 2019-09-22 NOTE — Telephone Encounter (Signed)
Spoke with pt, she is having weakness and fatigue and some palpitations that are not atrial fib by her report. The medical doctor is planning on checking her CBC today and she has an appointment with GI on Tuesday. Advised the patient to have the GI folks send any clearance needed to [place] and fax given. She feels her blood count maybe low and that is causing her symptoms. She is absolutely sure she is not in atrial fib.

## 2019-09-22 NOTE — Telephone Encounter (Signed)
Absolutely.  Please place POCT HGb and have patient come in today if she'd like

## 2019-09-22 NOTE — Telephone Encounter (Signed)
FOBT POSITIVE.  Please have her follow up with her GI dr ASAP

## 2019-09-25 DIAGNOSIS — Z7982 Long term (current) use of aspirin: Secondary | ICD-10-CM | POA: Diagnosis not present

## 2019-09-25 DIAGNOSIS — E119 Type 2 diabetes mellitus without complications: Secondary | ICD-10-CM | POA: Diagnosis not present

## 2019-09-25 DIAGNOSIS — G4733 Obstructive sleep apnea (adult) (pediatric): Secondary | ICD-10-CM | POA: Diagnosis not present

## 2019-09-25 DIAGNOSIS — K317 Polyp of stomach and duodenum: Secondary | ICD-10-CM | POA: Diagnosis not present

## 2019-09-25 DIAGNOSIS — Z95818 Presence of other cardiac implants and grafts: Secondary | ICD-10-CM | POA: Diagnosis not present

## 2019-09-25 DIAGNOSIS — Z888 Allergy status to other drugs, medicaments and biological substances status: Secondary | ICD-10-CM | POA: Diagnosis not present

## 2019-09-25 DIAGNOSIS — D62 Acute posthemorrhagic anemia: Secondary | ICD-10-CM | POA: Diagnosis not present

## 2019-09-25 DIAGNOSIS — I4891 Unspecified atrial fibrillation: Secondary | ICD-10-CM | POA: Diagnosis not present

## 2019-09-25 DIAGNOSIS — Z79899 Other long term (current) drug therapy: Secondary | ICD-10-CM | POA: Diagnosis not present

## 2019-09-25 DIAGNOSIS — Z7984 Long term (current) use of oral hypoglycemic drugs: Secondary | ICD-10-CM | POA: Diagnosis not present

## 2019-09-25 DIAGNOSIS — K922 Gastrointestinal hemorrhage, unspecified: Secondary | ICD-10-CM

## 2019-09-25 DIAGNOSIS — E039 Hypothyroidism, unspecified: Secondary | ICD-10-CM | POA: Diagnosis not present

## 2019-09-25 DIAGNOSIS — Z86010 Personal history of colonic polyps: Secondary | ICD-10-CM | POA: Diagnosis not present

## 2019-09-25 DIAGNOSIS — I10 Essential (primary) hypertension: Secondary | ICD-10-CM | POA: Diagnosis not present

## 2019-09-25 DIAGNOSIS — K921 Melena: Secondary | ICD-10-CM | POA: Diagnosis not present

## 2019-09-25 HISTORY — DX: Gastrointestinal hemorrhage, unspecified: K92.2

## 2019-09-26 ENCOUNTER — Encounter: Payer: Self-pay | Admitting: Pharmacist

## 2019-09-26 DIAGNOSIS — I10 Essential (primary) hypertension: Secondary | ICD-10-CM | POA: Diagnosis not present

## 2019-09-26 DIAGNOSIS — K317 Polyp of stomach and duodenum: Secondary | ICD-10-CM | POA: Diagnosis not present

## 2019-09-26 DIAGNOSIS — D649 Anemia, unspecified: Secondary | ICD-10-CM | POA: Diagnosis not present

## 2019-09-26 DIAGNOSIS — D131 Benign neoplasm of stomach: Secondary | ICD-10-CM | POA: Diagnosis not present

## 2019-09-26 DIAGNOSIS — D62 Acute posthemorrhagic anemia: Secondary | ICD-10-CM | POA: Diagnosis not present

## 2019-09-26 DIAGNOSIS — E119 Type 2 diabetes mellitus without complications: Secondary | ICD-10-CM | POA: Diagnosis not present

## 2019-09-26 DIAGNOSIS — E039 Hypothyroidism, unspecified: Secondary | ICD-10-CM | POA: Diagnosis not present

## 2019-09-26 DIAGNOSIS — K922 Gastrointestinal hemorrhage, unspecified: Secondary | ICD-10-CM | POA: Diagnosis not present

## 2019-09-26 DIAGNOSIS — K921 Melena: Secondary | ICD-10-CM | POA: Diagnosis not present

## 2019-09-26 MED ORDER — FUROSEMIDE 20 MG PO TABS
20.00 | ORAL_TABLET | ORAL | Status: DC
Start: ? — End: 2019-09-26

## 2019-09-26 MED ORDER — FLECAINIDE ACETATE 50 MG PO TABS
75.00 | ORAL_TABLET | ORAL | Status: DC
Start: 2019-09-27 — End: 2019-09-26

## 2019-09-26 MED ORDER — PRAVASTATIN SODIUM 20 MG PO TABS
20.00 | ORAL_TABLET | ORAL | Status: DC
Start: 2019-09-27 — End: 2019-09-26

## 2019-09-26 MED ORDER — GENERIC EXTERNAL MEDICATION
Status: DC
Start: ? — End: 2019-09-26

## 2019-09-26 MED ORDER — OXYCODONE HCL 5 MG PO TABS
5.00 | ORAL_TABLET | ORAL | Status: DC
Start: ? — End: 2019-09-26

## 2019-09-26 MED ORDER — LEVOTHYROXINE SODIUM 112 MCG PO TABS
112.00 | ORAL_TABLET | ORAL | Status: DC
Start: 2019-09-29 — End: 2019-09-26

## 2019-09-26 MED ORDER — LOSARTAN POTASSIUM 25 MG PO TABS
25.00 | ORAL_TABLET | ORAL | Status: DC
Start: 2019-09-28 — End: 2019-09-26

## 2019-09-26 MED ORDER — ASPIRIN 81 MG PO TBEC
81.00 | DELAYED_RELEASE_TABLET | ORAL | Status: DC
Start: 2019-09-28 — End: 2019-09-26

## 2019-09-26 MED ORDER — LEVOTHYROXINE SODIUM 112 MCG PO TABS
168.00 | ORAL_TABLET | ORAL | Status: DC
Start: 2019-09-28 — End: 2019-09-26

## 2019-09-26 MED ORDER — SODIUM CHLORIDE 0.9 % IV SOLN
10.00 | INTRAVENOUS | Status: DC
Start: ? — End: 2019-09-26

## 2019-09-26 MED ORDER — LABETALOL HCL 5 MG/ML IV SOLN
20.00 | INTRAVENOUS | Status: DC
Start: ? — End: 2019-09-26

## 2019-09-26 MED ORDER — METOPROLOL SUCCINATE ER 100 MG PO TB24
100.00 | ORAL_TABLET | ORAL | Status: DC
Start: 2019-09-28 — End: 2019-09-26

## 2019-09-26 MED ORDER — DILTIAZEM HCL ER BEADS 180 MG PO CP24
180.00 | ORAL_CAPSULE | ORAL | Status: DC
Start: 2019-09-28 — End: 2019-09-26

## 2019-09-26 MED ORDER — POLYETHYLENE GLYCOL 3350 17 GM/SCOOP PO POWD
17.00 | ORAL | Status: DC
Start: ? — End: 2019-09-26

## 2019-09-26 MED ORDER — CHLORTHALIDONE 25 MG PO TABS
25.00 | ORAL_TABLET | ORAL | Status: DC
Start: 2019-09-28 — End: 2019-09-26

## 2019-09-26 MED ORDER — PANTOPRAZOLE SODIUM 40 MG IV SOLR
40.00 | INTRAVENOUS | Status: DC
Start: 2019-09-27 — End: 2019-09-26

## 2019-09-26 MED ORDER — POTASSIUM CHLORIDE CRYS ER 20 MEQ PO TBCR
40.00 | EXTENDED_RELEASE_TABLET | ORAL | Status: DC
Start: 2019-09-26 — End: 2019-09-26

## 2019-09-26 MED ORDER — EZETIMIBE 10 MG PO TABS
10.00 | ORAL_TABLET | ORAL | Status: DC
Start: 2019-09-28 — End: 2019-09-26

## 2019-09-26 MED ORDER — ROPINIROLE HCL 0.25 MG PO TABS
0.50 | ORAL_TABLET | ORAL | Status: DC
Start: 2019-09-27 — End: 2019-09-26

## 2019-09-26 NOTE — Progress Notes (Signed)
    Pharmacy Clinic- Diabetes 09/19/2019 Name: Emily [name] MRN: [id number] DOB: 17-Jun-1950  Referred by: Janora Norlander, DO Reason for referral : Diabetes  S:  23 yoF presents for diabetes evaluation, education, and management Patient was referred and last seen by Primary Care Provider on 09/19/19.  Insurance coverage/medication affordability: [employer] medicare   Patient reports adherence with medications.  Current diabetes medications include: metformin, jardiance, glipizide  Current hypertension medications include: dilt, chlorthalidone, losartan, metop  Current hyperlipidemia medications include: lovastatin  Patient denies hypoglycemic events.  Patient reported dietary habits: Eats 3 meals/day  Discussed meal planning and dietary considerations in-depth  Gave patient handout on meals/planning/plate method  Patient-reported exercise habits: none  O:    Lab Results  Component Value Date   HGBA1C 7.8 (H) 09/08/2019    Vitals:   09/26/19 0948  BP: 140/75  Pulse: 80     Lipid Panel     Component Value Date/Time   CHOL 145 09/08/2019 1015   TRIG 156 (H) 09/08/2019 1015   HDL 37 (L) 09/08/2019 1015   CHOLHDL 3.9 09/08/2019 1015   LDLCALC 81 09/08/2019 1015   Home fasting blood sugars: 90-140  2 hour post-meal/random blood sugars: n/a.  A/P:  Diabetes longstanding T2DM fairly controlled, goal <7% trending up. Patient is able to verbalize appropriate hypoglycemia management plan. Patient is adherent with medication. Control is suboptimal due to dietary/lifestyle.  -Discussed starting GLP1--patient not keen on injecting, however she will think about it and discuss at next appt.  -Continued SGLT2-I Jardiance (generic name:empagliflozin)   -Continue metformin and glipizide for now  -Extensively discussed pathophysiology of diabetes, recommended lifestyle interventions, dietary effects on blood sugar control  -Counseled on s/sx of and management of  hypoglycemia  -Next A1C anticipated July 2021.    Written patient instructions provided.  Total time in face to face counseling 22 minutes.   Follow up PCP Clinic Visit in July 2021.      Regina Eck, PharmD, BCPS Clinical Pharmacist, [place]  [facility] Phone [phone]

## 2019-09-27 DIAGNOSIS — I10 Essential (primary) hypertension: Secondary | ICD-10-CM | POA: Diagnosis not present

## 2019-09-27 DIAGNOSIS — K921 Melena: Secondary | ICD-10-CM | POA: Diagnosis not present

## 2019-09-27 DIAGNOSIS — K922 Gastrointestinal hemorrhage, unspecified: Secondary | ICD-10-CM | POA: Diagnosis not present

## 2019-09-27 DIAGNOSIS — E039 Hypothyroidism, unspecified: Secondary | ICD-10-CM | POA: Diagnosis not present

## 2019-09-27 DIAGNOSIS — D62 Acute posthemorrhagic anemia: Secondary | ICD-10-CM | POA: Diagnosis not present

## 2019-09-28 ENCOUNTER — Telehealth: Payer: Self-pay | Admitting: *Deleted

## 2019-09-28 ENCOUNTER — Telehealth: Payer: Self-pay | Admitting: Family Medicine

## 2019-09-28 MED ORDER — GENERIC EXTERNAL MEDICATION
Status: DC
Start: ? — End: 2019-09-28

## 2019-09-28 NOTE — Telephone Encounter (Signed)
Pt aware that hgb order is active and will come by when she can

## 2019-09-28 NOTE — Telephone Encounter (Signed)
TOC scheduled

## 2019-09-28 NOTE — Telephone Encounter (Signed)
TRANSITIONAL CARE MANAGEMENT TELEPHONE OUTREACH NOTE   Contact Date: 09/28/2019 Contacted By: Zannie Cove, LPN     DISCHARGE INFORMATION Date of Discharge:09/27/19 Discharge Facility: [facility]  Principal Discharge [place] loss   Outpatient Follow Up Recommendations (copied from discharge summary)  Discharge instructions  Order Comments: Take all medications as prescribed and keep all follow up appointments. Please call your primary care doctor and make a follow-up appointment within the next 3-14 days. Please take all your medications and these discharge instructions with you to your follow-up appointment.    Emily [name] is a female primary care patient of Janora Norlander, DO. An outgoing telephone call was made today and I spoke with patient.  Ms. Stenberg condition(s) and treatment(s) were discussed. An opportunity to ask questions was provided and all were answered or forwarded as appropriate.    ACTIVITIES OF DAILY LIVING  [name] lives alone and she can perform ADLs independently. her primary caregiver is herself. she is able to depend on her primary caregiver(s) for consistent help. Transportation to appointments, to pick up medications, and to run errands is not a problem.  (Consider referral to [facility] if transportation or a consistent caregiver is a problem)   Fall Risk Fall Risk  09/12/2019 09/08/2019  Falls in the past year? 0 0  Number falls in past yr: - 0  Injury with Fall? - 0  Risk for fall due to : - No Fall Risks  Follow up - Falls evaluation completed    [place] Modifications/Assistive Devices Wheelchair: No Cane: No Ramp: No Bedside Toilet: No Hospital Bed:  No Other:    [place] she is not receiving home health services.     MEDICATION RECONCILIATION  [name] has been able to pick-up all prescribed discharge medications from the pharmacy.   A post discharge medication  reconciliation was performed and the complete medication list was reviewed with the patient/caregiver and is current as of 09/28/2019. Changes highlighted below.  Discontinued Medications Continue to hold your Chlorthalidone to avoid hypotension. If your blood pressure starts to elevate notify your PCP to resume taking it.   Current Medication List Allergies as of 09/28/2019      Reactions   Quinapril Hcl Cough   Statins Other (See Comments)   Not all Statins but some cause cough and pain in legs.   Tape Other (See Comments)   Redness, please use "paper" tape      Medication List       Accurate as of September 28, 2019  9:16 AM. If you have any questions, ask your nurse or doctor.        aspirin EC 81 MG tablet Take by mouth.   blood glucose meter kit and supplies Dispense based on patient and insurance preference. Use up to four times daily as directed. E11.9   chlorthalidone 25 MG tablet Commonly known as: HYGROTON TAKE 1 TABLET BY MOUTH EVERY DAY   CRANBERRY PO Take 1 tablet by mouth 2 (two) times daily.   diltiazem 180 MG 24 hr capsule Commonly known as: CARDIZEM CD TAKE 1 CAPSULE BY MOUTH EVERY DAY   diltiazem 30 MG tablet Commonly known as: CARDIZEM TAKE 1 TABLET BY MOUTH EVERY DAY What changed:   how much to take  how to take this  when to take this  additional instructions   doxycycline 100 MG tablet Commonly known as: VIBRA-TABS Take 1 tablet (100 mg total) by  mouth 2 (two) times daily.   ezetimibe 10 MG tablet Commonly known as: ZETIA TAKE 1 TABLET BY MOUTH EVERY DAY   flecainide 150 MG tablet Commonly known as: TAMBOCOR TAKE 0.5 TABLETS (75 MG TOTAL) BY MOUTH 2 (TWO) TIMES DAILY.   furosemide 20 MG tablet Commonly known as: Lasix Take 1 tablet (20 mg total) by mouth daily as needed for fluid.   glipiZIDE 5 MG tablet Commonly known as: GLUCOTROL Take 1 tablet (5 mg total) by mouth 2 (two) times daily.   glucose blood test strip Commonly  known as: Accu-Chek Guide USE TO CHECK BLOOD GLUCOSE ONCE DAILY Dx E11.9   Jardiance 25 MG Tabs tablet Generic drug: empagliflozin Take 25 mg by mouth daily before breakfast.   ketoconazole 2 % cream Commonly known as: NIZORAL Apply 1 application topically daily. x7-10 days (for skin yeast)   Lancet Device Misc Use to test sugar once daily E11.9   levothyroxine 112 MCG tablet Commonly known as: SYNTHROID TAKE 1 TABLET BY MOUTH EVERY OTHER DAY, ALTERNATING WITH 1 & 1/2 TABLETS EVERY OTHER DAY   losartan 25 MG tablet Commonly known as: COZAAR TAKE 1 TABLET BY MOUTH EVERY DAY   lovastatin 40 MG tablet Commonly known as: MEVACOR TAKE 1 TABLET BY MOUTH EVERY DAY IN THE EVENING   metFORMIN 1000 MG tablet Commonly known as: GLUCOPHAGE Take 1 tablet (1,000 mg total) by mouth 2 (two) times daily with a meal.   metoprolol succinate 100 MG 24 hr tablet Commonly known as: TOPROL-XL Take 1 tablet (100 mg total) by mouth daily. Take with or immediately following a meal.   PROBIOTIC-10 PO Take by mouth.   rOPINIRole 0.5 MG tablet Commonly known as: REQUIP TAKE 1 TABLET (0.5 MG TOTAL) BY MOUTH AT BEDTIME.   triamcinolone cream 0.1 % Commonly known as: KENALOG Apply 1 application topically 2 (two) times daily. To neck x7-10d        PATIENT EDUCATION & FOLLOW-UP PLAN  An appointment for Transitional Care Management is scheduled with Janora Norlander, DO on 10/06/19 at 1015am.  Take all medications as prescribed  Contact our office by calling [phone] if you have any questions or concerns

## 2019-09-30 DIAGNOSIS — Z9289 Personal history of other medical treatment: Secondary | ICD-10-CM

## 2019-09-30 HISTORY — DX: Personal history of other medical treatment: Z92.89

## 2019-10-02 ENCOUNTER — Telehealth: Payer: Self-pay | Admitting: Family Medicine

## 2019-10-02 ENCOUNTER — Other Ambulatory Visit: Payer: Self-pay | Admitting: Family Medicine

## 2019-10-02 DIAGNOSIS — E1165 Type 2 diabetes mellitus with hyperglycemia: Secondary | ICD-10-CM

## 2019-10-02 DIAGNOSIS — K922 Gastrointestinal hemorrhage, unspecified: Secondary | ICD-10-CM

## 2019-10-02 DIAGNOSIS — E034 Atrophy of thyroid (acquired): Secondary | ICD-10-CM

## 2019-10-02 NOTE — Telephone Encounter (Signed)
Patient aware and verbalizes understanding. 

## 2019-10-02 NOTE — Telephone Encounter (Signed)
Labs are in

## 2019-10-02 NOTE — Telephone Encounter (Signed)
Emily [name] has an appointment for hospital follow up on 10/06/19 for anemia. She would like to know if she can come in before then to have blood work to see what her levels are. She continues to feel weak and run down. Thank you!

## 2019-10-03 ENCOUNTER — Other Ambulatory Visit: Payer: Medicare Other

## 2019-10-03 ENCOUNTER — Other Ambulatory Visit: Payer: Self-pay

## 2019-10-03 DIAGNOSIS — K922 Gastrointestinal hemorrhage, unspecified: Secondary | ICD-10-CM | POA: Diagnosis not present

## 2019-10-03 DIAGNOSIS — E1165 Type 2 diabetes mellitus with hyperglycemia: Secondary | ICD-10-CM | POA: Diagnosis not present

## 2019-10-03 DIAGNOSIS — D62 Acute posthemorrhagic anemia: Secondary | ICD-10-CM | POA: Diagnosis not present

## 2019-10-03 DIAGNOSIS — R0600 Dyspnea, unspecified: Secondary | ICD-10-CM | POA: Diagnosis not present

## 2019-10-03 DIAGNOSIS — E034 Atrophy of thyroid (acquired): Secondary | ICD-10-CM

## 2019-10-03 LAB — HEMOGLOBIN, FINGERSTICK: Hemoglobin: 7.3 g/dL — ABNORMAL LOW (ref 11.1–15.9)

## 2019-10-04 DIAGNOSIS — D649 Anemia, unspecified: Secondary | ICD-10-CM | POA: Diagnosis not present

## 2019-10-04 LAB — BASIC METABOLIC PANEL
BUN/Creatinine Ratio: 35 — ABNORMAL HIGH (ref 12–28)
BUN: 28 mg/dL — ABNORMAL HIGH (ref 8–27)
CO2: 24 mmol/L (ref 20–29)
Calcium: 9 mg/dL (ref 8.7–10.3)
Chloride: 95 mmol/L — ABNORMAL LOW (ref 96–106)
Creatinine, Ser: 0.79 mg/dL (ref 0.57–1.00)
GFR calc Af Amer: 88 mL/min/{1.73_m2} (ref >59)
GFR calc non Af Amer: 77 mL/min/{1.73_m2} (ref >59)
Glucose: 271 mg/dL — ABNORMAL HIGH (ref 65–99)
Potassium: 3.8 mmol/L (ref 3.5–5.2)
Sodium: 135 mmol/L (ref 134–144)

## 2019-10-04 LAB — THYROID PANEL WITH TSH
Free Thyroxine Index: 2.1 (ref 1.2–4.9)
T3 Uptake Ratio: 28 % (ref 24–39)
T4, Total: 7.5 ug/dL (ref 4.5–12.0)
TSH: 10.8 u[IU]/mL — ABNORMAL HIGH (ref 0.450–4.500)

## 2019-10-05 ENCOUNTER — Other Ambulatory Visit: Payer: Self-pay | Admitting: *Deleted

## 2019-10-05 DIAGNOSIS — R946 Abnormal results of thyroid function studies: Secondary | ICD-10-CM

## 2019-10-06 ENCOUNTER — Other Ambulatory Visit: Payer: Self-pay

## 2019-10-06 ENCOUNTER — Ambulatory Visit (INDEPENDENT_AMBULATORY_CARE_PROVIDER_SITE_OTHER): Payer: Medicare Other | Admitting: Family Medicine

## 2019-10-06 ENCOUNTER — Encounter: Payer: Self-pay | Admitting: Family Medicine

## 2019-10-06 VITALS — BP 112/44 | HR 50 | Temp 97.6°F | Wt 223.0 lb

## 2019-10-06 DIAGNOSIS — Z09 Encounter for follow-up examination after completed treatment for conditions other than malignant neoplasm: Secondary | ICD-10-CM

## 2019-10-06 DIAGNOSIS — R7989 Other specified abnormal findings of blood chemistry: Secondary | ICD-10-CM | POA: Diagnosis not present

## 2019-10-06 DIAGNOSIS — K922 Gastrointestinal hemorrhage, unspecified: Secondary | ICD-10-CM

## 2019-10-06 DIAGNOSIS — K5900 Constipation, unspecified: Secondary | ICD-10-CM | POA: Diagnosis not present

## 2019-10-06 LAB — HEMOGLOBIN, FINGERSTICK: Hemoglobin: 7.4 g/dL — ABNORMAL LOW (ref 11.1–15.9)

## 2019-10-06 MED ORDER — LINACLOTIDE 290 MCG PO CAPS: 290.0000 ug | ORAL_CAPSULE | Freq: Every day | ORAL | 0 refills | Status: DC

## 2019-10-06 NOTE — Progress Notes (Signed)
Subjective: CC: TOC visit for Upper GI bleed PCP: Janora Norlander, DO WGN:FAOZH [place] is a 69 y.o. female presenting to clinic today for:  1. Upper GI bleed Patient was seen for labs on Tuesday in preparation for today's appt and noted to have an hemoglobin of 7.3.  I contacted her GI office and they were able to get her in on Wednesday.  No hgb repeated in their office per the note but her hemoglobin at discharge from hospital was 8.0.  She received 1 unit of packed red blood cells as an outpatient on 10/04/2019.  She is here for hospital follow-up and repeat labs.  Of note TSH also noted to be elevated over 10.  She does report some exertional fatigue with ambulating short distances.  She had some lower extremity edema and started taking her chlorthalidone again to relieve.  This was discontinued at her hospital discharge because of hypotension.  She has not seen any gross bleeding in the stool.  Does not report any palpitations, abdominal pain, nausea, vomiting.  She is not taking any oral iron.  She does report some constipation that has been ongoing for last several days and not relieved by MiraLAX or Senokot.  Appetite still remains good and she has not had any trouble taking in water or fluids.  Overall she does feel a little better than when she saw me on Tuesday.  ROS: Per HPI  Allergies  Allergen Reactions  . Quinapril Hcl Cough  . Statins Other (See Comments)    Not all Statins but some cause cough and pain in legs.  . Tape Other (See Comments)    Redness, please use "paper" tape   Past Medical History:  Diagnosis Date  . Anemia   . Arthritis   . Atrial fibrillation, rapid ([place]) 10/27/2013  . Bilateral carotid artery disease ([place])   . Diabetes mellitus without complication ([place])   . Difficult intubation    states 'lady that did the sleep study told me I have the smallest airway she has ever seen in an adult"  . Dyslipidemia 06/29/2017  . Dysrhythmia   .  Hypertension   . Hypothyroid   . Obesity   . Obstructive sleep apnea    on C Pap  . PAF (paroxysmal atrial fibrillation) ([place])    a. newly dx in 09/2013; on eliquis    Current Outpatient Medications:  .  aspirin EC 81 MG tablet, Take by mouth., Disp: , Rfl:  .  blood glucose meter kit and supplies, Dispense based on patient and insurance preference. Use up to four times daily as directed. E11.9, Disp: 1 each, Rfl: 0 .  chlorthalidone (HYGROTON) 25 MG tablet, TAKE 1 TABLET BY MOUTH EVERY DAY, Disp: 90 tablet, Rfl: 1 .  CRANBERRY PO, Take 1 tablet by mouth 2 (two) times daily., Disp: , Rfl:  .  diltiazem (CARDIZEM CD) 180 MG 24 hr capsule, TAKE 1 CAPSULE BY MOUTH EVERY DAY, Disp: 90 capsule, Rfl: 1 .  diltiazem (CARDIZEM) 30 MG tablet, TAKE 1 TABLET BY MOUTH EVERY DAY (Patient taking differently: Take only when heart is out of rhythm), Disp: 90 tablet, Rfl: 1 .  doxycycline (VIBRA-TABS) 100 MG tablet, Take 1 tablet (100 mg total) by mouth 2 (two) times daily., Disp: 20 tablet, Rfl: 0 .  empagliflozin (JARDIANCE) 25 MG TABS tablet, Take 25 mg by mouth daily before breakfast., Disp: 30 tablet, Rfl: 11 .  ezetimibe (ZETIA) 10 MG tablet, TAKE 1 TABLET BY MOUTH  EVERY DAY, Disp: 90 tablet, Rfl: 1 .  flecainide (TAMBOCOR) 150 MG tablet, TAKE 0.5 TABLETS (75 MG TOTAL) BY MOUTH 2 (TWO) TIMES DAILY., Disp: 90 tablet, Rfl: 3 .  furosemide (LASIX) 20 MG tablet, Take 1 tablet (20 mg total) by mouth daily as needed for fluid., Disp: 90 tablet, Rfl: 0 .  glipiZIDE (GLUCOTROL) 5 MG tablet, Take 1 tablet (5 mg total) by mouth 2 (two) times daily., Disp: 180 tablet, Rfl: 3 .  glucose blood (ACCU-CHEK GUIDE) test strip, USE TO CHECK BLOOD GLUCOSE ONCE DAILY Dx E11.9, Disp: 100 each, Rfl: 3 .  ketoconazole (NIZORAL) 2 % cream, Apply 1 application topically daily. x7-10 days (for skin yeast), Disp: 60 g, Rfl: 0 .  Lancet Device MISC, Use to test sugar once daily E11.9, Disp: 100 each, Rfl: 12 .  levothyroxine  (SYNTHROID) 112 MCG tablet, TAKE 1 TABLET BY MOUTH EVERY OTHER DAY, ALTERNATING WITH 1 & 1/2 TABLETS EVERY OTHER DAY, Disp: 112 tablet, Rfl: 3 .  losartan (COZAAR) 25 MG tablet, TAKE 1 TABLET BY MOUTH EVERY DAY, Disp: 90 tablet, Rfl: 1 .  lovastatin (MEVACOR) 40 MG tablet, TAKE 1 TABLET BY MOUTH EVERY DAY IN THE EVENING, Disp: 90 tablet, Rfl: 3 .  metFORMIN (GLUCOPHAGE) 1000 MG tablet, Take 1 tablet (1,000 mg total) by mouth 2 (two) times daily with a meal., Disp: 180 tablet, Rfl: 3 .  metoprolol succinate (TOPROL-XL) 100 MG 24 hr tablet, Take 1 tablet (100 mg total) by mouth daily. Take with or immediately following a meal., Disp: 90 tablet, Rfl: 3 .  Probiotic Product (PROBIOTIC-10 PO), Take by mouth., Disp: , Rfl:  .  rOPINIRole (REQUIP) 0.5 MG tablet, TAKE 1 TABLET (0.5 MG TOTAL) BY MOUTH AT BEDTIME., Disp: 90 tablet, Rfl: 0 .  triamcinolone cream (KENALOG) 0.1 %, Apply 1 application topically 2 (two) times daily. To neck x7-10d, Disp: 30 g, Rfl: 0  Current Facility-Administered Medications:  .  cyanocobalamin ((VITAMIN B-12)) injection 1,000 mcg, 1,000 mcg, Intramuscular, Q30 days, Timmothy Euler, MD, 1,000 mcg at 09/19/19 1105 Social History   Socioeconomic History  . Marital status: Single    Spouse name: Not on file  . Number of children: Not on file  . Years of education: Not on file  . Highest education level: Not on file  Occupational History  . Not on file  Tobacco Use  . Smoking status: Never Smoker  . Smokeless tobacco: Never Used  Substance and Sexual Activity  . Alcohol use: No    Alcohol/week: 0.0 standard drinks  . Drug use: Never  . Sexual activity: Never  Other Topics Concern  . Not on file  Social History Narrative  . Not on file   Social Determinants of Health   Financial Resource Strain:   . Difficulty of Paying Living Expenses:   Food Insecurity:   . Worried About Charity fundraiser in the Last Year:   . Arboriculturist in the Last Year:     Transportation Needs:   . Film/video editor (Medical):   Marland Kitchen Lack of Transportation (Non-Medical):   Physical Activity:   . Days of Exercise per Week:   . Minutes of Exercise per Session:   Stress:   . Feeling of Stress :   Social Connections:   . Frequency of Communication with Friends and Family:   . Frequency of Social Gatherings with Friends and Family:   . Attends Religious Services:   . Active Member of  Clubs or Organizations:   . Attends Archivist Meetings:   Marland Kitchen Marital Status:   Intimate Partner Violence:   . Fear of Current or Ex-Partner:   . Emotionally Abused:   Marland Kitchen Physically Abused:   . Sexually Abused:    Family History  Problem Relation Age of Onset  . COPD Father   . Heart failure Father   . Heart disease Father   . Emphysema Father   . Arrhythmia Sister   . Arrhythmia Sister   . Arrhythmia Sister        had PPM also  . Cancer Sister     Objective: Office vital signs reviewed. BP (!) 112/44   Pulse (!) 50   Temp 97.6 F (36.4 C)   Wt 223 lb (101.2 kg)   SpO2 98%   BMI 40.79 kg/m   Physical Examination:  General: Awake, alert, tired appearing, No acute distress HEENT: Normal. Mild conjunctival pallor Cardio: bradycardic with regular rhythm. S1S2 heard, blowing systolic murmurs appreciated Pulm: clear to auscultation bilaterally, no wheezes, rhonchi or rales; normal work of breathing on room air Extremities: warm, well perfused, No edema, cyanosis or clubbing; +2 pulses bilaterally MSK: slow gait Skin: dry; intact; no rashes or lesions; normal temperature Neuro: no tremor  Assessment/ Plan: 69 y.o. female   1. Upper GI bleed Point-of-care hemoglobin 7.4 today.  This is not an appropriate response to 1 unit packed red blood cells administered on the fifth.  I would expect her to be at least in the 8 range of hemoglobin.  I contacted her GI office and asked a message to be given to Morey Hummingbird, the nurse practitioner that she saw earlier  this week as well as the triage nurse.  I anticipate she will likely need at minimum an outpatient transfusion to get her through the weekend if not readmission for capsule study as originally planned.  We discussed red flag signs and symptoms here in the office the patient voiced good understanding.  She will also try and contact the GI office when she leaves this appointment. - Hemoglobin, fingerstick  2. Constipation, unspecified constipation type Push oral fluids.  Discontinue use of chlorthalidone as directed.  We discussed that swelling in the lower extremities is expected given profound anemia.  Compression hose, elevation of legs recommended.  Her blood pressure is borderline here in office and I do not think that she should continue the chlorthalidone.  This is reemphasized during today's visit.  I have given her a few tablets of Linzess to trial.  I do not think that she has an SBO at this point but we discussed red flag signs and symptoms which would warrant emergent evaluation emergency department.  She voiced good understanding. - linaclotide (LINZESS) 290 MCG CAPS capsule; Take 1 capsule (290 mcg total) by mouth daily before breakfast.  Dispense: 8 capsule; Refill: 0  3. Elevated TSH Plan for repeat thyroid panel after acute illness has resolved. - Thyroid Panel With TSH; Future   No orders of the defined types were placed in this encounter.  No orders of the defined types were placed in this encounter.   Today's visit is for Transitional Care Management.  The patient was discharged from [facility] on 09/27/2019 with a primary diagnosis of Upper GI bleed.   Contact with the patient and/or caregiver, by a clinical staff member, was made on 09/28/2019 and was documented as a telephone encounter within the EMR.  Through chart review and discussion with the patient  I have determined that management of their condition is of high complexity given ongoing GI bleed whose etiology is yet to be  determined and lack of appropriate response from transfusion 2 days ago.  I suspect due to ongoing (and likely brisk) bleeding.    Janora Norlander, DO [place] [phone]

## 2019-10-06 NOTE — Patient Instructions (Signed)
Use Linzess daily for constipation If symptoms do not improve or if you develop red flag symptoms (severe abdominal pain, nausea, vomiting, decreased appetite), you need to seek immediate medical attention

## 2019-10-07 ENCOUNTER — Other Ambulatory Visit: Payer: Self-pay | Admitting: Cardiology

## 2019-10-10 DIAGNOSIS — D649 Anemia, unspecified: Secondary | ICD-10-CM | POA: Diagnosis not present

## 2019-10-11 ENCOUNTER — Telehealth: Payer: Self-pay | Admitting: Cardiology

## 2019-10-11 ENCOUNTER — Telehealth: Payer: Self-pay | Admitting: Family Medicine

## 2019-10-11 DIAGNOSIS — D500 Iron deficiency anemia secondary to blood loss (chronic): Secondary | ICD-10-CM

## 2019-10-11 DIAGNOSIS — K922 Gastrointestinal hemorrhage, unspecified: Secondary | ICD-10-CM

## 2019-10-11 NOTE — Telephone Encounter (Signed)
Spoke with patient. She wanted to let Dr. Percival Spanish know she was hospitalized 2 weeks ago for GI bleeding but they haven't found the source yet. Patient had another blood transfusion yesterday due to low hemoglobin. Patient reports the hospital told her not to take her chlorthalidone due to hypotension and bradycardia so she is currently off that medication but thinks it may be temporary. She reports continued fatigue and her HR not being higher than low 60s. Patient made follow up appointment with Dr. Percival Spanish for Friday to assess BP, HR and to ensure medications do not need to be altered while patient continues treatment for her low hemoglobin.

## 2019-10-11 NOTE — Telephone Encounter (Signed)
Patient calling stating she has been in the hospital and had a digestive bleed. She states her hemoglobin was down to 7.1, but they did not find an active bleed. She states 09/27/2019 her hemoglobin went up to 8 and she was sent home. She states 10/03/2019 it was 7.4 and on 10/04/2019 she had a blood transfusion, but her hemoglobin stated at 7.4. She states yesterday, 10/10/2019, she had a transfusion of 2 units of blood. Patient states her BP and HR yesterday was very low and the hospital took her off chlorthalidone (HYGROTON) 25 MG tablet, but not permanently. She states her HR never got above 60. Please advise.

## 2019-10-11 NOTE — Telephone Encounter (Signed)
Emily [name] states she received 2 units of blood on 10/10/19 and would like to know if she can come have her blood count rechecked here on Friday, 10/13/19. Please call her to let her know if this is approved. Thank you!

## 2019-10-12 DIAGNOSIS — I6523 Occlusion and stenosis of bilateral carotid arteries: Secondary | ICD-10-CM | POA: Insufficient documentation

## 2019-10-12 DIAGNOSIS — Z7189 Other specified counseling: Secondary | ICD-10-CM | POA: Insufficient documentation

## 2019-10-12 NOTE — Telephone Encounter (Signed)
Orders placed under Dr. Lajuana Ripple and Evelina Dun, FNP will look at results when they come in.  GI should also be able to see results in Epic

## 2019-10-12 NOTE — Progress Notes (Signed)
HPI The patient presents for followup of atrial fib.   The patient has paroxysmal atrial fibrillation and was treated with flecainide. She has had recurrent GI bleeding and could not tolerate anticoagulation.  She was getting follow-up of her watchman device at [facility].   In December she had PAF and called EMS.  She had GI bleeding again and she was in the hospital in April for this.  She has had a complete work-up and not been able to identify a source.  She has had transfusions.  She is waiting for another hemoglobin but she thinks her hemoglobin might finally be up from the 7.4 it was.  She has been fatigued with this.  Despite all of this she has not had any cardiac complaints.  She has not had any palpitations, presyncope or syncope.  She was taken off her chlorthalidone because her diastolic blood pressures were low.  Her beta-blockers and calcium channel blockers have been continued though she has been a little bit on the bradycardic side.  She is not had any symptoms related to this.  She has had some very mild lower extremity swelling with her transfusions.  She has had no chest pressure, neck or arm discomfort.  She has been holding off on going back to work but is going to start back.   Allergies  Allergen Reactions  . Quinapril Hcl Cough  . Statins Other (See Comments)    Not all Statins but some cause cough and pain in legs.  . Tape Other (See Comments)    Redness, please use "paper" tape    Current Outpatient Medications  Medication Sig Dispense Refill  . aspirin EC 81 MG tablet Take by mouth.    . blood glucose meter kit and supplies Dispense based on patient and insurance preference. Use up to four times daily as directed. E11.9 1 each 0  . chlorthalidone (HYGROTON) 25 MG tablet TAKE 1 TABLET BY MOUTH EVERY DAY 90 tablet 1  . CRANBERRY PO Take 1 tablet by mouth 2 (two) times daily.    Marland Kitchen diltiazem (CARDIZEM CD) 180 MG 24 hr capsule TAKE 1 CAPSULE BY MOUTH EVERY DAY 90  capsule 1  . diltiazem (CARDIZEM) 30 MG tablet TAKE 1 TABLET BY MOUTH EVERY DAY (Patient taking differently: Take only when heart is out of rhythm) 90 tablet 1  . doxycycline (VIBRA-TABS) 100 MG tablet Take 1 tablet (100 mg total) by mouth 2 (two) times daily. 20 tablet 0  . empagliflozin (JARDIANCE) 25 MG TABS tablet Take 25 mg by mouth daily before breakfast. 30 tablet 11  . ezetimibe (ZETIA) 10 MG tablet TAKE 1 TABLET BY MOUTH EVERY DAY 90 tablet 1  . flecainide (TAMBOCOR) 150 MG tablet TAKE 0.5 TABLETS (75 MG TOTAL) BY MOUTH 2 (TWO) TIMES DAILY. 90 tablet 3  . furosemide (LASIX) 20 MG tablet Take 1 tablet (20 mg total) by mouth daily as needed for fluid. 90 tablet 0  . glipiZIDE (GLUCOTROL) 5 MG tablet Take 1 tablet (5 mg total) by mouth 2 (two) times daily. 180 tablet 3  . glucose blood (ACCU-CHEK GUIDE) test strip USE TO CHECK BLOOD GLUCOSE ONCE DAILY Dx E11.9 100 each 3  . ketoconazole (NIZORAL) 2 % cream Apply 1 application topically daily. x7-10 days (for skin yeast) 60 g 0  . Lancet Device MISC Use to test sugar once daily E11.9 100 each 12  . levothyroxine (SYNTHROID) 112 MCG tablet TAKE 1 TABLET BY MOUTH EVERY  OTHER DAY, ALTERNATING WITH 1 & 1/2 TABLETS EVERY OTHER DAY 112 tablet 3  . linaclotide (LINZESS) 290 MCG CAPS capsule Take 1 capsule (290 mcg total) by mouth daily before breakfast. 8 capsule 0  . losartan (COZAAR) 25 MG tablet TAKE 1 TABLET BY MOUTH EVERY DAY 90 tablet 1  . lovastatin (MEVACOR) 40 MG tablet TAKE 1 TABLET BY MOUTH EVERY DAY IN THE EVENING 90 tablet 3  . metFORMIN (GLUCOPHAGE) 1000 MG tablet Take 1 tablet (1,000 mg total) by mouth 2 (two) times daily with a meal. 180 tablet 3  . metoprolol succinate (TOPROL-XL) 100 MG 24 hr tablet Take 1 tablet (100 mg total) by mouth daily. Take with or immediately following a meal. 90 tablet 3  . pantoprazole (PROTONIX) 40 MG tablet Take by mouth.    . Probiotic Product (PROBIOTIC-10 PO) Take by mouth.    Marland Kitchen rOPINIRole (REQUIP)  0.5 MG tablet TAKE 1 TABLET (0.5 MG TOTAL) BY MOUTH AT BEDTIME. 90 tablet 0  . triamcinolone cream (KENALOG) 0.1 % Apply 1 application topically 2 (two) times daily. To neck x7-10d 30 g 0   Current Facility-Administered Medications  Medication Dose Route Frequency Provider Last Rate Last Admin  . cyanocobalamin ((VITAMIN B-12)) injection 1,000 mcg  1,000 mcg Intramuscular Q30 days Timmothy Euler, MD   1,000 mcg at 09/19/19 1105    Past Medical History:  Diagnosis Date  . Anemia   . Arthritis   . Atrial fibrillation, rapid ([place]) 10/27/2013  . Bilateral carotid artery disease ([place])   . Diabetes mellitus without complication ([place])   . Difficult intubation    states 'lady that did the sleep study told me I have the smallest airway she has ever seen in an adult"  . Dyslipidemia 06/29/2017  . Dysrhythmia   . Hypertension   . Hypothyroid   . Obesity   . Obstructive sleep apnea    on C Pap  . PAF (paroxysmal atrial fibrillation) ([place])    a. newly dx in 09/2013; on eliquis    Past Surgical History:  Procedure Laterality Date  . CATARACT EXTRACTION Left   . CATARACT EXTRACTION W/PHACO Right 12/09/2015   Procedure: CATARACT EXTRACTION PHACO AND INTRAOCULAR LENS PLACEMENT (IOC);  Surgeon: Tonny Branch, MD;  Location: [facility];  Service: Ophthalmology;  Laterality: Right;  CDE:10.48  . COLONOSCOPY W/ POLYPECTOMY    . cyst on back of neck removed    . DILATION AND CURETTAGE OF UTERUS     x 5  . ENDARTERECTOMY Left 08/30/2015   Procedure: LEFT CAROYID ENDARTERECTOMY WITH XENOSURE BOVINE PERICARDIUM PATCH ANGIOPLASTY;  Surgeon: [name], MD;  Location: [facility];  Service: Vascular;  Laterality: Left;  . EYE SURGERY    . TONSILLECTOMY    . UPPER GI ENDOSCOPY      ROS:   As stated in the HPI and negative for all other systems.  PHYSICAL EXAM BP (!) 142/58   Pulse 64   Ht 5' 2.5" (1.588 m)   Wt 226 lb (102.5 kg)   SpO2 99%   BMI 40.68 kg/m   GENERAL:  Well appearing NECK:  No  jugular venous distention, waveform within normal limits, carotid upstroke brisk and symmetric, bilateral bruits bruits, no thyromegaly LUNGS:  Clear to auscultation bilaterally CHEST:  Unremarkable HEART:  PMI not displaced or sustained,S1 and S2 within normal limits, no S3, no S4, no clicks, no rubs, 3 out of 6 apical systolic murmur early to mid peaking and radiating up  the aortic outflow tract, no diastolic murmurs ABD:  Flat, positive bowel sounds normal in frequency in pitch, no bruits, no rebound, no guarding, no midline pulsatile mass, no hepatomegaly, no splenomegaly EXT:  2 plus pulses throughout, no edema, no cyanosis no clubbing   EKG: Sinus rhythm, rate 64, left axis deviation, interventricular conduction delay, no acute ST-T wave changes.  ASSESSMENT AND PLAN  ATRIAL FIB:   .  [name] has a CHA2DS2 - VASc score of 4.  She seems to be maintaining sinus rhythm.  She has had a Watchman.  No change in therapy.   GI BLEEDING:     She is having this closely followed in work-up.   HTN: Blood pressure is mildly elevated but has not been previously.  She can remain off the chlorthalidone.   CAROTID STENOSIS:  She had 60 - 79% right stenosis and left 40 - 59% stenosis in March of this year.  I will follow this again in September.     OBESITY: She has maintained her weight loss and encouraged to do more.   DM: A1c was 7.8.  I will defer to her primary provider.   AS/LVH: She had this on echo in 2019 and I am going to follow-up with another echo cardiogram.  COVID EDUCATION: She has had her vaccines.

## 2019-10-12 NOTE — Telephone Encounter (Signed)
It looks like GI is the one transfusing her, is this correct? If so, I would expect them to follow-up. Please correct me if I am wrong.

## 2019-10-13 ENCOUNTER — Telehealth: Payer: Self-pay | Admitting: Family Medicine

## 2019-10-13 ENCOUNTER — Other Ambulatory Visit: Payer: Medicare Other

## 2019-10-13 ENCOUNTER — Encounter: Payer: Self-pay | Admitting: Cardiology

## 2019-10-13 ENCOUNTER — Ambulatory Visit: Payer: Medicare Other | Admitting: Cardiology

## 2019-10-13 ENCOUNTER — Other Ambulatory Visit: Payer: Self-pay

## 2019-10-13 VITALS — BP 142/58 | HR 64 | Ht 62.5 in | Wt 226.0 lb

## 2019-10-13 DIAGNOSIS — K921 Melena: Secondary | ICD-10-CM

## 2019-10-13 DIAGNOSIS — I10 Essential (primary) hypertension: Secondary | ICD-10-CM | POA: Diagnosis not present

## 2019-10-13 DIAGNOSIS — Z7189 Other specified counseling: Secondary | ICD-10-CM

## 2019-10-13 DIAGNOSIS — E118 Type 2 diabetes mellitus with unspecified complications: Secondary | ICD-10-CM | POA: Diagnosis not present

## 2019-10-13 DIAGNOSIS — I480 Paroxysmal atrial fibrillation: Secondary | ICD-10-CM | POA: Diagnosis not present

## 2019-10-13 DIAGNOSIS — I6523 Occlusion and stenosis of bilateral carotid arteries: Secondary | ICD-10-CM | POA: Diagnosis not present

## 2019-10-13 LAB — HEMOGLOBIN, FINGERSTICK: Hemoglobin: 9 g/dL — ABNORMAL LOW (ref 11.1–15.9)

## 2019-10-13 NOTE — Telephone Encounter (Signed)
Emily [name] is due to come in for B12 injection on the 25th and would like to know if you would put in an order for her to have her hemoglobin and her thyroid rechecked at that time. Please let her know if that is ok.

## 2019-10-13 NOTE — Patient Instructions (Addendum)
Medication Instructions:  Your physician recommends that you continue on your current medications as directed. Please refer to the Current Medication list given to you today.  *If you need a refill on your cardiac medications before your next appointment, please call your pharmacy*  Lab Work: NONE ordered at this time of appointment   If you have labs (blood work) drawn today and your tests are completely normal, you will receive your results only by: Marland Kitchen MyChart Message (if you have MyChart) OR . A paper copy in the mail If you have any lab test that is abnormal or we need to change your treatment, we will call you to review the results.  Testing/Procedures: Your physician has requested that you have an echocardiogram. Echocardiography is a painless test that uses sound waves to create images of your heart. It provides your doctor with information about the size and shape of your heart and how well your heart's chambers and valves are working. This procedure takes approximately one hour. There are no restrictions for this procedure. This test is done at [address]   Please schedule for 2-3 weeks   Follow-Up: At [facility], you and your health needs are our priority.  As part of our continuing mission to provide you with exceptional heart care, we have created designated Provider Care Teams.  These Care Teams include your primary Cardiologist (physician) and Advanced Practice Providers (APPs -  Physician Assistants and Nurse Practitioners) who all work together to provide you with the care you need, when you need it.  We recommend signing up for the patient portal called "MyChart".  Sign up information is provided on this After Visit Summary.  MyChart is used to connect with patients for Virtual Visits (Telemedicine).  Patients are able to view lab/test results, encounter notes, upcoming appointments, etc.  Non-urgent messages can be sent to your provider as well.   To  learn more about what you can do with MyChart, go to [email or web address].    Your next appointment:   3 month(s) at the [place] office   The format for your next appointment:   In Person  Provider:   Minus Breeding, MD  Other Instructions

## 2019-10-16 NOTE — Telephone Encounter (Signed)
Please remind patient that she should not be checking her thyroid again until 6 weeks following her last visit.  She will not be due for recheck until at least the second week in June. She already has a standing order for hemoglobin.

## 2019-10-16 NOTE — Telephone Encounter (Signed)
Patient aware and verbalizes understanding. 

## 2019-10-17 ENCOUNTER — Other Ambulatory Visit: Payer: Medicare Other

## 2019-10-17 ENCOUNTER — Other Ambulatory Visit: Payer: Self-pay

## 2019-10-17 DIAGNOSIS — D500 Iron deficiency anemia secondary to blood loss (chronic): Secondary | ICD-10-CM | POA: Diagnosis not present

## 2019-10-17 DIAGNOSIS — K922 Gastrointestinal hemorrhage, unspecified: Secondary | ICD-10-CM

## 2019-10-17 LAB — HEMOGLOBIN, FINGERSTICK: Hemoglobin: 7.4 g/dL — ABNORMAL LOW (ref 11.1–15.9)

## 2019-10-18 DIAGNOSIS — D649 Anemia, unspecified: Secondary | ICD-10-CM | POA: Diagnosis not present

## 2019-10-20 ENCOUNTER — Other Ambulatory Visit: Payer: Self-pay | Admitting: Family Medicine

## 2019-10-20 ENCOUNTER — Telehealth: Payer: Self-pay | Admitting: Family Medicine

## 2019-10-20 DIAGNOSIS — D500 Iron deficiency anemia secondary to blood loss (chronic): Secondary | ICD-10-CM

## 2019-10-20 NOTE — Telephone Encounter (Signed)
Emily [name] had a blood transfusion on 10/18/19. She would like to recheck her hemoglobin when she comes in on 10/24/19 for B12 injection. She would like for you to put in an order for that if approved. Please call her and let her know.

## 2019-10-20 NOTE — Telephone Encounter (Signed)
Done

## 2019-10-23 ENCOUNTER — Other Ambulatory Visit: Payer: Self-pay

## 2019-10-23 ENCOUNTER — Ambulatory Visit (INDEPENDENT_AMBULATORY_CARE_PROVIDER_SITE_OTHER): Payer: Medicare Other | Admitting: *Deleted

## 2019-10-23 ENCOUNTER — Other Ambulatory Visit: Payer: Medicare Other

## 2019-10-23 DIAGNOSIS — E538 Deficiency of other specified B group vitamins: Secondary | ICD-10-CM

## 2019-10-23 DIAGNOSIS — D500 Iron deficiency anemia secondary to blood loss (chronic): Secondary | ICD-10-CM | POA: Diagnosis not present

## 2019-10-23 LAB — HEMOGLOBIN, FINGERSTICK: Hemoglobin: 7.9 g/dL — ABNORMAL LOW (ref 11.1–15.9)

## 2019-10-23 NOTE — Telephone Encounter (Signed)
Pt aware of order

## 2019-10-23 NOTE — Progress Notes (Addendum)
Patient in today for B12 injection. 1000 mcg given IM left deltoid. Patient tolerated well.

## 2019-10-24 ENCOUNTER — Telehealth: Payer: Self-pay | Admitting: Family Medicine

## 2019-10-24 ENCOUNTER — Inpatient Hospital Stay: Payer: Medicare Other | Admitting: Family Medicine

## 2019-10-24 ENCOUNTER — Ambulatory Visit: Payer: Medicare Other

## 2019-10-24 NOTE — Telephone Encounter (Signed)
Would probably hold them since she will likely be fasting for procedure.

## 2019-10-24 NOTE — Telephone Encounter (Signed)
Patient aware.

## 2019-10-25 ENCOUNTER — Telehealth: Payer: Self-pay | Admitting: Family Medicine

## 2019-10-25 ENCOUNTER — Other Ambulatory Visit: Payer: Self-pay

## 2019-10-25 ENCOUNTER — Other Ambulatory Visit: Payer: Medicare Other

## 2019-10-25 DIAGNOSIS — D649 Anemia, unspecified: Secondary | ICD-10-CM | POA: Diagnosis not present

## 2019-10-25 DIAGNOSIS — K921 Melena: Secondary | ICD-10-CM | POA: Diagnosis not present

## 2019-10-25 DIAGNOSIS — D500 Iron deficiency anemia secondary to blood loss (chronic): Secondary | ICD-10-CM

## 2019-10-25 LAB — HEMOGLOBIN, FINGERSTICK: Hemoglobin: 7 g/dL — CL (ref 11.1–15.9)

## 2019-10-25 NOTE — Telephone Encounter (Signed)
Patient came in for hemoglobin check as she has been feeling more fatigued and dyspneic on exertion.  She was worried that her hemoglobin had yet again dropped.  She is status post at least 3 transfusions over the last 3 weeks for ongoing bleed.  She is currently undergoing a capsule study which she plans on returning at 430 today.  I reached out to her gastroenterologist office to discuss likely need for repeat transfusion given symptomatic anemia and drop in hemoglobin.  I tried to come through the notes to see if a tagged RBC study had been done, unsure if this would even be of relevance in this patient's case.  I am hoping that the capsule study will give more insight as to where she is bleeding.  I left my cell phone number and am awaiting callback from her gastroenterologist to coordinate this patient's care.  Patient aware of red flag signs and symptoms warranting further evaluation emergency department

## 2019-10-26 DIAGNOSIS — D649 Anemia, unspecified: Secondary | ICD-10-CM | POA: Diagnosis not present

## 2019-10-27 DIAGNOSIS — Z7984 Long term (current) use of oral hypoglycemic drugs: Secondary | ICD-10-CM | POA: Diagnosis not present

## 2019-10-27 DIAGNOSIS — Z86010 Personal history of colonic polyps: Secondary | ICD-10-CM | POA: Diagnosis not present

## 2019-10-27 DIAGNOSIS — Z803 Family history of malignant neoplasm of breast: Secondary | ICD-10-CM | POA: Diagnosis not present

## 2019-10-27 DIAGNOSIS — Z79899 Other long term (current) drug therapy: Secondary | ICD-10-CM | POA: Diagnosis not present

## 2019-10-27 DIAGNOSIS — Z95820 Peripheral vascular angioplasty status with implants and grafts: Secondary | ICD-10-CM | POA: Diagnosis not present

## 2019-10-27 DIAGNOSIS — E119 Type 2 diabetes mellitus without complications: Secondary | ICD-10-CM | POA: Diagnosis not present

## 2019-10-27 DIAGNOSIS — I482 Chronic atrial fibrillation, unspecified: Secondary | ICD-10-CM | POA: Diagnosis not present

## 2019-10-27 DIAGNOSIS — E785 Hyperlipidemia, unspecified: Secondary | ICD-10-CM | POA: Diagnosis not present

## 2019-10-27 DIAGNOSIS — I480 Paroxysmal atrial fibrillation: Secondary | ICD-10-CM | POA: Diagnosis not present

## 2019-10-27 DIAGNOSIS — K922 Gastrointestinal hemorrhage, unspecified: Secondary | ICD-10-CM | POA: Diagnosis not present

## 2019-10-27 DIAGNOSIS — Z91048 Other nonmedicinal substance allergy status: Secondary | ICD-10-CM | POA: Diagnosis not present

## 2019-10-27 DIAGNOSIS — I454 Nonspecific intraventricular block: Secondary | ICD-10-CM | POA: Diagnosis not present

## 2019-10-27 DIAGNOSIS — G4733 Obstructive sleep apnea (adult) (pediatric): Secondary | ICD-10-CM | POA: Diagnosis not present

## 2019-10-27 DIAGNOSIS — K921 Melena: Secondary | ICD-10-CM | POA: Diagnosis not present

## 2019-10-27 DIAGNOSIS — I10 Essential (primary) hypertension: Secondary | ICD-10-CM | POA: Diagnosis not present

## 2019-10-27 DIAGNOSIS — Z7982 Long term (current) use of aspirin: Secondary | ICD-10-CM | POA: Diagnosis not present

## 2019-10-27 DIAGNOSIS — G2581 Restless legs syndrome: Secondary | ICD-10-CM | POA: Diagnosis not present

## 2019-10-27 DIAGNOSIS — K219 Gastro-esophageal reflux disease without esophagitis: Secondary | ICD-10-CM | POA: Diagnosis not present

## 2019-10-27 DIAGNOSIS — Z888 Allergy status to other drugs, medicaments and biological substances status: Secondary | ICD-10-CM | POA: Diagnosis not present

## 2019-10-27 DIAGNOSIS — E039 Hypothyroidism, unspecified: Secondary | ICD-10-CM | POA: Diagnosis not present

## 2019-10-31 ENCOUNTER — Telehealth: Payer: Self-pay | Admitting: Family Medicine

## 2019-10-31 ENCOUNTER — Other Ambulatory Visit: Payer: Self-pay | Admitting: Family Medicine

## 2019-10-31 DIAGNOSIS — D500 Iron deficiency anemia secondary to blood loss (chronic): Secondary | ICD-10-CM

## 2019-10-31 NOTE — Telephone Encounter (Signed)
Order in.

## 2019-10-31 NOTE — Telephone Encounter (Signed)
Left detailed voice message.

## 2019-10-31 NOTE — Telephone Encounter (Signed)
Emily [name] is awaiting a procedure at [facility] to treat a GI bleed. She had blood transfusion on 10/26/19. She would like to come in on 11/01/19 to have hemoglobin rechecked. Please call her to let her know if this is approved. Thank you!

## 2019-11-01 ENCOUNTER — Other Ambulatory Visit: Payer: Medicare Other

## 2019-11-01 ENCOUNTER — Other Ambulatory Visit: Payer: Self-pay

## 2019-11-01 DIAGNOSIS — D500 Iron deficiency anemia secondary to blood loss (chronic): Secondary | ICD-10-CM | POA: Diagnosis not present

## 2019-11-01 LAB — HEMOGLOBIN, FINGERSTICK: Hemoglobin: 8.3 g/dL — ABNORMAL LOW (ref 11.1–15.9)

## 2019-11-06 ENCOUNTER — Ambulatory Visit: Payer: Medicare Other | Admitting: Family Medicine

## 2019-11-07 ENCOUNTER — Other Ambulatory Visit: Payer: Self-pay

## 2019-11-07 ENCOUNTER — Ambulatory Visit (HOSPITAL_COMMUNITY): Payer: Medicare Other | Attending: Cardiovascular Disease

## 2019-11-07 DIAGNOSIS — I480 Paroxysmal atrial fibrillation: Secondary | ICD-10-CM | POA: Diagnosis not present

## 2019-11-08 ENCOUNTER — Other Ambulatory Visit: Payer: Medicare Other

## 2019-11-08 DIAGNOSIS — D500 Iron deficiency anemia secondary to blood loss (chronic): Secondary | ICD-10-CM

## 2019-11-08 LAB — HEMOGLOBIN, FINGERSTICK: Hemoglobin: 8.1 g/dL — ABNORMAL LOW (ref 11.1–15.9)

## 2019-11-10 ENCOUNTER — Other Ambulatory Visit: Payer: Self-pay

## 2019-11-10 ENCOUNTER — Other Ambulatory Visit: Payer: Medicare Other

## 2019-11-10 DIAGNOSIS — D500 Iron deficiency anemia secondary to blood loss (chronic): Secondary | ICD-10-CM

## 2019-11-10 LAB — HEMOGLOBIN, FINGERSTICK: Hemoglobin: 8.5 g/dL — ABNORMAL LOW (ref 11.1–15.9)

## 2019-11-12 ENCOUNTER — Other Ambulatory Visit: Payer: Self-pay | Admitting: Family Medicine

## 2019-11-16 ENCOUNTER — Other Ambulatory Visit: Payer: Medicare Other

## 2019-11-16 ENCOUNTER — Other Ambulatory Visit: Payer: Self-pay

## 2019-11-16 DIAGNOSIS — D500 Iron deficiency anemia secondary to blood loss (chronic): Secondary | ICD-10-CM

## 2019-11-16 LAB — HEMOGLOBIN, FINGERSTICK: Hemoglobin: 8.1 g/dL — ABNORMAL LOW (ref 11.1–15.9)

## 2019-11-20 ENCOUNTER — Other Ambulatory Visit: Payer: Self-pay

## 2019-11-20 ENCOUNTER — Other Ambulatory Visit: Payer: Medicare Other

## 2019-11-20 DIAGNOSIS — R7989 Other specified abnormal findings of blood chemistry: Secondary | ICD-10-CM | POA: Diagnosis not present

## 2019-11-20 DIAGNOSIS — D510 Vitamin B12 deficiency anemia due to intrinsic factor deficiency: Secondary | ICD-10-CM | POA: Diagnosis not present

## 2019-11-20 DIAGNOSIS — D500 Iron deficiency anemia secondary to blood loss (chronic): Secondary | ICD-10-CM | POA: Diagnosis not present

## 2019-11-20 LAB — HEMOGLOBIN, FINGERSTICK: Hemoglobin: 8.8 g/dL — ABNORMAL LOW (ref 11.1–15.9)

## 2019-11-21 LAB — THYROID PANEL WITH TSH
Free Thyroxine Index: 2.8 (ref 1.2–4.9)
T3 Uptake Ratio: 32 % (ref 24–39)
T4, Total: 8.9 ug/dL (ref 4.5–12.0)
TSH: 4.11 u[IU]/mL (ref 0.450–4.500)

## 2019-11-28 ENCOUNTER — Ambulatory Visit: Payer: Medicare Other

## 2019-11-28 ENCOUNTER — Other Ambulatory Visit: Payer: Self-pay

## 2019-11-28 ENCOUNTER — Ambulatory Visit (INDEPENDENT_AMBULATORY_CARE_PROVIDER_SITE_OTHER): Payer: Medicare Other | Admitting: Family Medicine

## 2019-11-28 ENCOUNTER — Telehealth: Payer: Self-pay | Admitting: Family Medicine

## 2019-11-28 ENCOUNTER — Encounter: Payer: Self-pay | Admitting: Family Medicine

## 2019-11-28 VITALS — BP 127/52 | HR 65 | Temp 98.0°F | Ht 62.5 in | Wt 225.8 lb

## 2019-11-28 DIAGNOSIS — E1165 Type 2 diabetes mellitus with hyperglycemia: Secondary | ICD-10-CM | POA: Diagnosis not present

## 2019-11-28 DIAGNOSIS — E538 Deficiency of other specified B group vitamins: Secondary | ICD-10-CM

## 2019-11-28 DIAGNOSIS — K922 Gastrointestinal hemorrhage, unspecified: Secondary | ICD-10-CM

## 2019-11-28 DIAGNOSIS — R499 Unspecified voice and resonance disorder: Secondary | ICD-10-CM

## 2019-11-28 LAB — HEMOGLOBIN, FINGERSTICK: Hemoglobin: 8.3 g/dL — ABNORMAL LOW (ref 11.1–15.9)

## 2019-11-28 LAB — BAYER DCA HB A1C WAIVED: HB A1C (BAYER DCA - WAIVED): 6.8 % (ref <7.0)

## 2019-11-28 NOTE — Telephone Encounter (Signed)
Dr. Percival Spanish message to me to let me know that she should CONTINUE her heart medications prior to her procedure unless GI requests specifically that the aspirin be held.  She should however hold her sugar medications as she will likely be fasting for that GI procedure.

## 2019-11-28 NOTE — Progress Notes (Signed)
Subjective: CC: f/u Upper GI bleed PCP: Emily Norlander, DO [name] is a 69 y.o. female presenting to clinic today for:  1. Upper GI bleed Her capsule study showed a GI bleed.  Unfortunately they were unable to reach this and she was subsequently referred to [facility].  She has an appointment with them on July 26 for evaluation and then plans for procedure on July 27.  Of note, she is not actively bleeding anymore.  She notes that her energy is improving some but she still fatigues easily with ambulation and sometimes has occasional shortness of breath but again all this is getting better.  Denies any appreciable hematochezia, melena, dizziness, loss of consciousness.  She does have occasional heart palpitations.  She was wondering what she should do about her sugar medications prior to the procedure.  She has not yet reached out to her cardiologist about her heart medications.  2.  Change in voice Patient reports that since she had her left carotid surgery, she has had difficulty hitting the high notes when singing.  She is still able to do low notes but this is worried her.  She is not sure if this is because of aging or if there was damage during the surgery.  She would like to get this evaluated at some point but is going to put the GI bleed on the forefront.  ROS: Per HPI  Allergies  Allergen Reactions  . Quinapril Hcl Cough  . Statins Other (See Comments)    Not all Statins but some cause cough and pain in legs.  . Tape Other (See Comments)    Redness, please use "paper" tape   Past Medical History:  Diagnosis Date  . Anemia   . Arthritis   . Atrial fibrillation, rapid (Emily [name]) 10/27/2013  . Bilateral carotid artery disease ([place])   . Diabetes mellitus without complication ([place])   . Difficult intubation    states 'lady that did the sleep study told me I have the smallest airway she has ever seen in an adult"  . Dyslipidemia 06/29/2017  . Dysrhythmia   .  Hypertension   . Hypothyroid   . Obesity   . Obstructive sleep apnea    on C Pap  . PAF (paroxysmal atrial fibrillation) ([place])    a. newly dx in 09/2013; on eliquis    Current Outpatient Medications:  .  aspirin EC 81 MG tablet, Take by mouth., Disp: , Rfl:  .  blood glucose meter kit and supplies, Dispense based on patient and insurance preference. Use up to four times daily as directed. E11.9, Disp: 1 each, Rfl: 0 .  CRANBERRY PO, Take 1 tablet by mouth 2 (two) times daily., Disp: , Rfl:  .  diltiazem (CARDIZEM CD) 180 MG 24 hr capsule, TAKE 1 CAPSULE BY MOUTH EVERY DAY, Disp: 90 capsule, Rfl: 1 .  diltiazem (CARDIZEM) 30 MG tablet, TAKE 1 TABLET BY MOUTH EVERY DAY (Patient taking differently: Take only when heart is out of rhythm), Disp: 90 tablet, Rfl: 1 .  empagliflozin (JARDIANCE) 25 MG TABS tablet, Take 25 mg by mouth daily before breakfast., Disp: 30 tablet, Rfl: 11 .  ezetimibe (ZETIA) 10 MG tablet, TAKE 1 TABLET BY MOUTH EVERY DAY, Disp: 90 tablet, Rfl: 1 .  flecainide (TAMBOCOR) 150 MG tablet, TAKE 0.5 TABLETS (75 MG TOTAL) BY MOUTH 2 (TWO) TIMES DAILY., Disp: 90 tablet, Rfl: 3 .  furosemide (LASIX) 20 MG tablet, Take 1 tablet (20 mg total) by  mouth daily as needed for fluid., Disp: 90 tablet, Rfl: 0 .  glipiZIDE (GLUCOTROL) 5 MG tablet, Take 1 tablet (5 mg total) by mouth 2 (two) times daily., Disp: 180 tablet, Rfl: 3 .  glucose blood (ACCU-CHEK GUIDE) test strip, USE TO CHECK BLOOD GLUCOSE ONCE DAILY Dx E11.9, Disp: 100 each, Rfl: 3 .  ketoconazole (NIZORAL) 2 % cream, Apply 1 application topically daily. x7-10 days (for skin yeast), Disp: 60 g, Rfl: 0 .  Lancet Device MISC, Use to test sugar once daily E11.9, Disp: 100 each, Rfl: 12 .  levothyroxine (SYNTHROID) 112 MCG tablet, TAKE 1 TABLET BY MOUTH EVERY OTHER DAY, ALTERNATING WITH 1 & 1/2 TABLETS EVERY OTHER DAY, Disp: 112 tablet, Rfl: 3 .  losartan (COZAAR) 25 MG tablet, TAKE 1 TABLET BY MOUTH EVERY DAY, Disp: 90 tablet, Rfl:  1 .  lovastatin (MEVACOR) 40 MG tablet, TAKE 1 TABLET BY MOUTH EVERY DAY IN THE EVENING, Disp: 90 tablet, Rfl: 3 .  metFORMIN (GLUCOPHAGE) 1000 MG tablet, Take 1 tablet (1,000 mg total) by mouth 2 (two) times daily with a meal., Disp: 180 tablet, Rfl: 3 .  metoprolol succinate (TOPROL-XL) 100 MG 24 hr tablet, Take 1 tablet (100 mg total) by mouth daily. Take with or immediately following a meal., Disp: 90 tablet, Rfl: 3 .  Probiotic Product (PROBIOTIC-10 PO), Take by mouth., Disp: , Rfl:  .  rOPINIRole (REQUIP) 0.5 MG tablet, TAKE 1 TABLET (0.5 MG TOTAL) BY MOUTH AT BEDTIME., Disp: 90 tablet, Rfl: 0 .  triamcinolone cream (KENALOG) 0.1 %, Apply 1 application topically 2 (two) times daily. To neck x7-10d, Disp: 30 g, Rfl: 0 .  chlorthalidone (HYGROTON) 25 MG tablet, TAKE 1 TABLET BY MOUTH EVERY DAY (Patient not taking: Reported on 11/28/2019), Disp: 90 tablet, Rfl: 1 .  linaclotide (LINZESS) 290 MCG CAPS capsule, Take 1 capsule (290 mcg total) by mouth daily before breakfast. (Patient not taking: Reported on 11/28/2019), Disp: 8 capsule, Rfl: 0 .  pantoprazole (PROTONIX) 40 MG tablet, Take by mouth. (Patient not taking: Reported on 11/28/2019), Disp: , Rfl:   Current Facility-Administered Medications:  .  cyanocobalamin ((VITAMIN B-12)) injection 1,000 mcg, 1,000 mcg, Intramuscular, Q30 days, Emily Euler, MD, 1,000 mcg at 10/23/19 0962 Social History   Socioeconomic History  . Marital status: Single    Spouse name: Not on file  . Number of children: Not on file  . Years of education: Not on file  . Highest education level: Not on file  Occupational History  . Not on file  Tobacco Use  . Smoking status: Never Smoker  . Smokeless tobacco: Never Used  Vaping Use  . Vaping Use: Never used  Substance and Sexual Activity  . Alcohol use: No    Alcohol/week: 0.0 standard drinks  . Drug use: Never  . Sexual activity: Never  Other Topics Concern  . Not on file  Social History Narrative    . Not on file   Social Determinants of Health   Financial Resource Strain:   . Difficulty of Paying Living Expenses:   Food Insecurity:   . Worried About Charity fundraiser in the Last Year:   . Arboriculturist in the Last Year:   Transportation Needs:   . Film/video editor (Medical):   Marland Kitchen Lack of Transportation (Non-Medical):   Physical Activity:   . Days of Exercise per Week:   . Minutes of Exercise per Session:   Stress:   . Feeling of  Stress :   Social Connections:   . Frequency of Communication with Friends and Family:   . Frequency of Social Gatherings with Friends and Family:   . Attends Religious Services:   . Active Member of Clubs or Organizations:   . Attends Archivist Meetings:   Marland Kitchen Marital Status:   Intimate Partner Violence:   . Fear of Current or Ex-Partner:   . Emotionally Abused:   Marland Kitchen Physically Abused:   . Sexually Abused:    Family History  Problem Relation Age of Onset  . COPD Father   . Heart failure Father   . Heart disease Father   . Emphysema Father   . Arrhythmia Sister   . Arrhythmia Sister   . Arrhythmia Sister        had PPM also  . Cancer Sister     Objective: Office vital signs reviewed. BP (!) 127/52   Pulse 65   Temp 98 F (36.7 C) (Temporal)   Ht 5' 2.5" (1.588 m)   Wt 225 lb 12.8 oz (102.4 kg)   SpO2 98%   BMI 40.64 kg/m   Physical Examination:  General: Awake, alert, continues to appear pale but with more energy today.  No acute distress HEENT: Normal. conjunctival pallor present Cardio: bradycardic with regular rhythm. S1S2 heard, blowing systolic murmur noted at bilateral sternal borders  Pulm: clear to auscultation bilaterally, no wheezes, rhonchi or rales; normal work of breathing on room air  Assessment/ Plan: 69 y.o. female   1. Upper GI bleed Check hemoglobin.  Proceed with [facility] referral.  Hold diabetes medications prior to procedure since she will be fasting.  I will reach out to her  cardiologist with regards to her atrial fibrillation medications. - Hemoglobin, fingerstick  2. B12 deficiency Check sending B12 since we have not checked this in greater than 1 year.  This was also administered during today's visit after the lab draw - Vitamin B12  3. Uncontrolled type 2 diabetes mellitus with hyperglycemia (HCC) A1c - Bayer DCA Hb A1c Waived  4. Change in voice Does not wish to be referred to ENT just yet but at some point would like to have this evaluated.   Emily Norlander, DO [place] [phone]

## 2019-11-29 LAB — VITAMIN B12: Vitamin B-12: 531 pg/mL (ref 232–1245)

## 2019-12-07 ENCOUNTER — Other Ambulatory Visit: Payer: Self-pay

## 2019-12-07 ENCOUNTER — Other Ambulatory Visit: Payer: Medicare Other

## 2019-12-07 DIAGNOSIS — D500 Iron deficiency anemia secondary to blood loss (chronic): Secondary | ICD-10-CM | POA: Diagnosis not present

## 2019-12-07 LAB — HEMOGLOBIN, FINGERSTICK: Hemoglobin: 9 g/dL — ABNORMAL LOW (ref 11.1–15.9)

## 2019-12-08 ENCOUNTER — Other Ambulatory Visit: Payer: Self-pay | Admitting: Family Medicine

## 2019-12-19 ENCOUNTER — Encounter: Payer: Self-pay | Admitting: Family Medicine

## 2019-12-19 ENCOUNTER — Other Ambulatory Visit: Payer: Self-pay

## 2019-12-19 ENCOUNTER — Ambulatory Visit (INDEPENDENT_AMBULATORY_CARE_PROVIDER_SITE_OTHER): Payer: Medicare Other | Admitting: Family Medicine

## 2019-12-19 VITALS — BP 135/58 | HR 57 | Temp 97.4°F | Ht 62.5 in | Wt 226.6 lb

## 2019-12-19 DIAGNOSIS — K922 Gastrointestinal hemorrhage, unspecified: Secondary | ICD-10-CM

## 2019-12-19 DIAGNOSIS — D500 Iron deficiency anemia secondary to blood loss (chronic): Secondary | ICD-10-CM

## 2019-12-19 LAB — HEMOGLOBIN, FINGERSTICK: Hemoglobin: 8.2 g/dL — ABNORMAL LOW (ref 11.1–15.9)

## 2019-12-19 NOTE — Progress Notes (Signed)
Subjective: CC: f/u Upper GI bleed PCP: Emily [name], Emily [name] NAT:[id number] Emily [name] is a 69 y.o. female presenting to clinic today for:  1. Upper GI bleed To summarize her capsule study showed a GI bleed.  She has an appointment on July 27 with [facility] for initial eval with planned procedure for July 28.  She is not seen in hematochezia or melena.  She does report feeling a little bit more fatigued than normal.  Denies any falls.  She continues to have some dyspnea on exertion and easy fatigability with small activities.  ROS: Per HPI  Allergies  Allergen Reactions  . Quinapril Hcl Cough  . Statins Other (See Comments)    Not all Statins but some cause cough and pain in legs.  . Tape Other (See Comments)    Redness, please use "paper" tape   Past Medical History:  Diagnosis Date  . Anemia   . Arthritis   . Atrial fibrillation, rapid ([place]) 10/27/2013  . Bilateral carotid artery disease ([place])   . Diabetes mellitus without complication ([place])   . Difficult intubation    states 'lady that did the sleep study told me I have the smallest airway she has ever seen in an adult"  . Dyslipidemia 06/29/2017  . Dysrhythmia   . Hypertension   . Hypothyroid   . Obesity   . Obstructive sleep apnea    on C Pap  . PAF (paroxysmal atrial fibrillation) ([place])    a. newly dx in 09/2013; on eliquis    Current Outpatient Medications:  .  ACCU-CHEK GUIDE test strip, USE TO CHECK BLOOD GLUCOSE ONCE DAILY DX E11.9, Disp: 100 strip, Rfl: 3 .  aspirin EC 81 MG tablet, Take by mouth., Disp: , Rfl:  .  blood glucose meter kit and supplies, Dispense based on patient and insurance preference. Use up to four times daily as directed. E11.9, Disp: 1 each, Rfl: 0 .  chlorthalidone (HYGROTON) 25 MG tablet, TAKE 1 TABLET BY MOUTH EVERY DAY (Patient not taking: Reported on 11/28/2019), Disp: 90 tablet, Rfl: 1 .  CRANBERRY PO, Take 1 tablet by mouth 2 (two) times daily., Disp: , Rfl:  .  diltiazem  (CARDIZEM CD) 180 MG 24 hr capsule, TAKE 1 CAPSULE BY MOUTH EVERY DAY, Disp: 90 capsule, Rfl: 1 .  diltiazem (CARDIZEM) 30 MG tablet, TAKE 1 TABLET BY MOUTH EVERY DAY (Patient taking differently: Take only when heart is out of rhythm), Disp: 90 tablet, Rfl: 1 .  empagliflozin (JARDIANCE) 25 MG TABS tablet, Take 25 mg by mouth daily before breakfast., Disp: 30 tablet, Rfl: 11 .  ezetimibe (ZETIA) 10 MG tablet, TAKE 1 TABLET BY MOUTH EVERY DAY, Disp: 90 tablet, Rfl: 1 .  flecainide (TAMBOCOR) 150 MG tablet, TAKE 0.5 TABLETS (75 MG TOTAL) BY MOUTH 2 (TWO) TIMES DAILY., Disp: 90 tablet, Rfl: 3 .  furosemide (LASIX) 20 MG tablet, Take 1 tablet (20 mg total) by mouth daily as needed for fluid., Disp: 90 tablet, Rfl: 0 .  glipiZIDE (GLUCOTROL) 5 MG tablet, Take 1 tablet (5 mg total) by mouth 2 (two) times daily., Disp: 180 tablet, Rfl: 3 .  ketoconazole (NIZORAL) 2 % cream, Apply 1 application topically daily. x7-10 days (for skin yeast), Disp: 60 g, Rfl: 0 .  Lancet Device MISC, Use to test sugar once daily E11.9, Disp: 100 each, Rfl: 12 .  levothyroxine (SYNTHROID) 112 MCG tablet, TAKE 1 TABLET BY MOUTH EVERY OTHER DAY, ALTERNATING WITH 1 & 1/2 TABLETS EVERY  OTHER DAY, Disp: 112 tablet, Rfl: 3 .  linaclotide (LINZESS) 290 MCG CAPS capsule, Take 1 capsule (290 mcg total) by mouth daily before breakfast. (Patient not taking: Reported on 11/28/2019), Disp: 8 capsule, Rfl: 0 .  losartan (COZAAR) 25 MG tablet, TAKE 1 TABLET BY MOUTH EVERY DAY, Disp: 90 tablet, Rfl: 1 .  lovastatin (MEVACOR) 40 MG tablet, TAKE 1 TABLET BY MOUTH EVERY DAY IN THE EVENING, Disp: 90 tablet, Rfl: 3 .  metFORMIN (GLUCOPHAGE) 1000 MG tablet, Take 1 tablet (1,000 mg total) by mouth 2 (two) times daily with a meal., Disp: 180 tablet, Rfl: 3 .  metoprolol succinate (TOPROL-XL) 100 MG 24 hr tablet, Take 1 tablet (100 mg total) by mouth daily. Take with or immediately following a meal., Disp: 90 tablet, Rfl: 3 .  pantoprazole (PROTONIX) 40  MG tablet, Take by mouth. (Patient not taking: Reported on 11/28/2019), Disp: , Rfl:  .  Probiotic Product (PROBIOTIC-10 PO), Take by mouth., Disp: , Rfl:  .  rOPINIRole (REQUIP) 0.5 MG tablet, TAKE 1 TABLET (0.5 MG TOTAL) BY MOUTH AT BEDTIME., Disp: 90 tablet, Rfl: 0 .  triamcinolone cream (KENALOG) 0.1 %, Apply 1 application topically 2 (two) times daily. To neck x7-10d, Disp: 30 g, Rfl: 0  Current Facility-Administered Medications:  .  cyanocobalamin ((VITAMIN B-12)) injection 1,000 mcg, 1,000 mcg, Intramuscular, Q30 days, Emily Euler, MD, 1,000 mcg at 11/28/19 1126 Social History   Socioeconomic History  . Marital status: Single    Spouse name: Not on file  . Number of children: Not on file  . Years of education: Not on file  . Highest education level: Not on file  Occupational History  . Not on file  Tobacco Use  . Smoking status: Never Smoker  . Smokeless tobacco: Never Used  Vaping Use  . Vaping Use: Never used  Substance and Sexual Activity  . Alcohol use: No    Alcohol/week: 0.0 standard drinks  . Drug use: Never  . Sexual activity: Never  Other Topics Concern  . Not on file  Social History Narrative  . Not on file   Social Determinants of Health   Financial Resource Strain:   . Difficulty of Paying Living Expenses:   Food Insecurity:   . Worried About Charity fundraiser in the Last Year:   . Arboriculturist in the Last Year:   Transportation Needs:   . Film/video editor (Medical):   Marland Kitchen Lack of Transportation (Non-Medical):   Physical Activity:   . Days of Exercise per Week:   . Minutes of Exercise per Session:   Stress:   . Feeling of Stress :   Social Connections:   . Frequency of Communication with Friends and Family:   . Frequency of Social Gatherings with Friends and Family:   . Attends Religious Services:   . Active Member of Clubs or Organizations:   . Attends Archivist Meetings:   Marland Kitchen Marital Status:   Intimate Partner  Violence:   . Fear of Current or Ex-Partner:   . Emotionally Abused:   Marland Kitchen Physically Abused:   . Sexually Abused:    Family History  Problem Relation Age of Onset  . COPD Father   . Heart failure Father   . Heart disease Father   . Emphysema Father   . Arrhythmia Sister   . Arrhythmia Sister   . Arrhythmia Sister        had PPM also  . Cancer Sister  Objective: Office vital signs reviewed. BP (!) 135/58   Pulse (!) 57   Temp (!) 97.4 F (36.3 C)   Ht 5' 2.5" (1.588 m)   Wt 226 lb 9.6 oz (102.8 kg)   SpO2 97%   BMI 40.79 kg/m   Physical Examination:  General: Awake, alert,  No acute distress HEENT: appears pale  Cardio: bradycardic with regular rhythm. S1S2 heard, 2/6 blowing systolic murmur noted at bilateral sternal borders  Pulm: clear to auscultation bilaterally, no wheezes, rhonchi or rales; normal work of breathing on room air MSK: Ambulating independently.  No results found for this or any previous visit (from the past 24 hour(s)).  Assessment/ Plan: 69 y.o. female   1. Upper GI bleed Point-of-care hemoglobin revealed a slight drop compared to her point-of-care 2 weeks ago.  Her hemoglobin was 8.2 today.  A copy of this was provided.  I encouraged her to consider following up Thursday afternoon or Friday morning for repeat to make sure that she does not continue to downtrend prior to her appointment next week  2. Blood loss anemia - Hemoglobin, fingerstick   Emily Dice Windell Moulding, Emily [name] [place] [phone]

## 2019-12-21 ENCOUNTER — Other Ambulatory Visit: Payer: Medicare Other

## 2019-12-21 ENCOUNTER — Other Ambulatory Visit: Payer: Self-pay

## 2019-12-21 DIAGNOSIS — D500 Iron deficiency anemia secondary to blood loss (chronic): Secondary | ICD-10-CM

## 2019-12-21 LAB — HEMOGLOBIN, FINGERSTICK: Hemoglobin: 9 g/dL — ABNORMAL LOW (ref 11.1–15.9)

## 2019-12-26 DIAGNOSIS — D500 Iron deficiency anemia secondary to blood loss (chronic): Secondary | ICD-10-CM | POA: Diagnosis not present

## 2019-12-26 DIAGNOSIS — R933 Abnormal findings on diagnostic imaging of other parts of digestive tract: Secondary | ICD-10-CM | POA: Diagnosis not present

## 2019-12-26 DIAGNOSIS — I4891 Unspecified atrial fibrillation: Secondary | ICD-10-CM | POA: Diagnosis not present

## 2019-12-27 DIAGNOSIS — G4730 Sleep apnea, unspecified: Secondary | ICD-10-CM | POA: Diagnosis not present

## 2019-12-27 DIAGNOSIS — K921 Melena: Secondary | ICD-10-CM | POA: Diagnosis not present

## 2019-12-27 DIAGNOSIS — E119 Type 2 diabetes mellitus without complications: Secondary | ICD-10-CM | POA: Diagnosis not present

## 2019-12-27 DIAGNOSIS — Z794 Long term (current) use of insulin: Secondary | ICD-10-CM | POA: Diagnosis not present

## 2019-12-27 DIAGNOSIS — D649 Anemia, unspecified: Secondary | ICD-10-CM | POA: Diagnosis not present

## 2019-12-27 DIAGNOSIS — Z7984 Long term (current) use of oral hypoglycemic drugs: Secondary | ICD-10-CM | POA: Diagnosis not present

## 2019-12-27 DIAGNOSIS — Z79899 Other long term (current) drug therapy: Secondary | ICD-10-CM | POA: Diagnosis not present

## 2019-12-27 DIAGNOSIS — Z7982 Long term (current) use of aspirin: Secondary | ICD-10-CM | POA: Diagnosis not present

## 2019-12-27 DIAGNOSIS — I10 Essential (primary) hypertension: Secondary | ICD-10-CM | POA: Diagnosis not present

## 2019-12-27 DIAGNOSIS — R933 Abnormal findings on diagnostic imaging of other parts of digestive tract: Secondary | ICD-10-CM | POA: Diagnosis not present

## 2019-12-28 ENCOUNTER — Telehealth: Payer: Self-pay | Admitting: Family Medicine

## 2019-12-29 ENCOUNTER — Ambulatory Visit (INDEPENDENT_AMBULATORY_CARE_PROVIDER_SITE_OTHER): Payer: Medicare Other

## 2019-12-29 ENCOUNTER — Other Ambulatory Visit: Payer: Self-pay

## 2019-12-29 ENCOUNTER — Other Ambulatory Visit: Payer: Self-pay | Admitting: Family Medicine

## 2019-12-29 DIAGNOSIS — E538 Deficiency of other specified B group vitamins: Secondary | ICD-10-CM

## 2019-12-29 DIAGNOSIS — D500 Iron deficiency anemia secondary to blood loss (chronic): Secondary | ICD-10-CM | POA: Diagnosis not present

## 2019-12-29 LAB — HEMOGLOBIN, FINGERSTICK: Hemoglobin: 8.7 g/dL — ABNORMAL LOW (ref 11.1–15.9)

## 2019-12-29 NOTE — Progress Notes (Signed)
Pt given B12 IM right deltoid without difficulty today. Next appt scheduled for 01/30/20.

## 2019-12-29 NOTE — Addendum Note (Signed)
Addended by: Earlene Plater on: 12/29/2019 11:52 AM   Modules accepted: Orders

## 2020-01-01 ENCOUNTER — Telehealth: Payer: Self-pay | Admitting: Family Medicine

## 2020-01-01 NOTE — Telephone Encounter (Signed)
Will call her as soon as I get a chance

## 2020-01-01 NOTE — Telephone Encounter (Signed)
Patient states she dropped off the paper work on Friday- please advise

## 2020-01-02 ENCOUNTER — Other Ambulatory Visit: Payer: Self-pay

## 2020-01-02 ENCOUNTER — Other Ambulatory Visit: Payer: Medicare Other

## 2020-01-02 DIAGNOSIS — D500 Iron deficiency anemia secondary to blood loss (chronic): Secondary | ICD-10-CM | POA: Diagnosis not present

## 2020-01-02 LAB — HEMOGLOBIN, FINGERSTICK: Hemoglobin: 9.6 g/dL — ABNORMAL LOW (ref 11.1–15.9)

## 2020-01-02 NOTE — Telephone Encounter (Signed)
Pt came in to do some lab work and was checking to see if Dr Lajuana Ripple got her message about needing advice regarding her options that Dr Reymundo Poll gave her when she was at [facility] having procedure. Explained to pt that Dr Lajuana Ripple did get her message and plans to call her when able.

## 2020-01-06 ENCOUNTER — Other Ambulatory Visit: Payer: Self-pay | Admitting: Family Medicine

## 2020-01-06 IMAGING — CR DG CHEST 2V
2 series · 2 of 2 positions shown · non-contrast
Comparison: 11/05/2015

CLINICAL DATA: Severe weakness.  Atrial fibrillation.  Diabetes.

EXAM:
CHEST - 2 VIEW

[w chest pa]
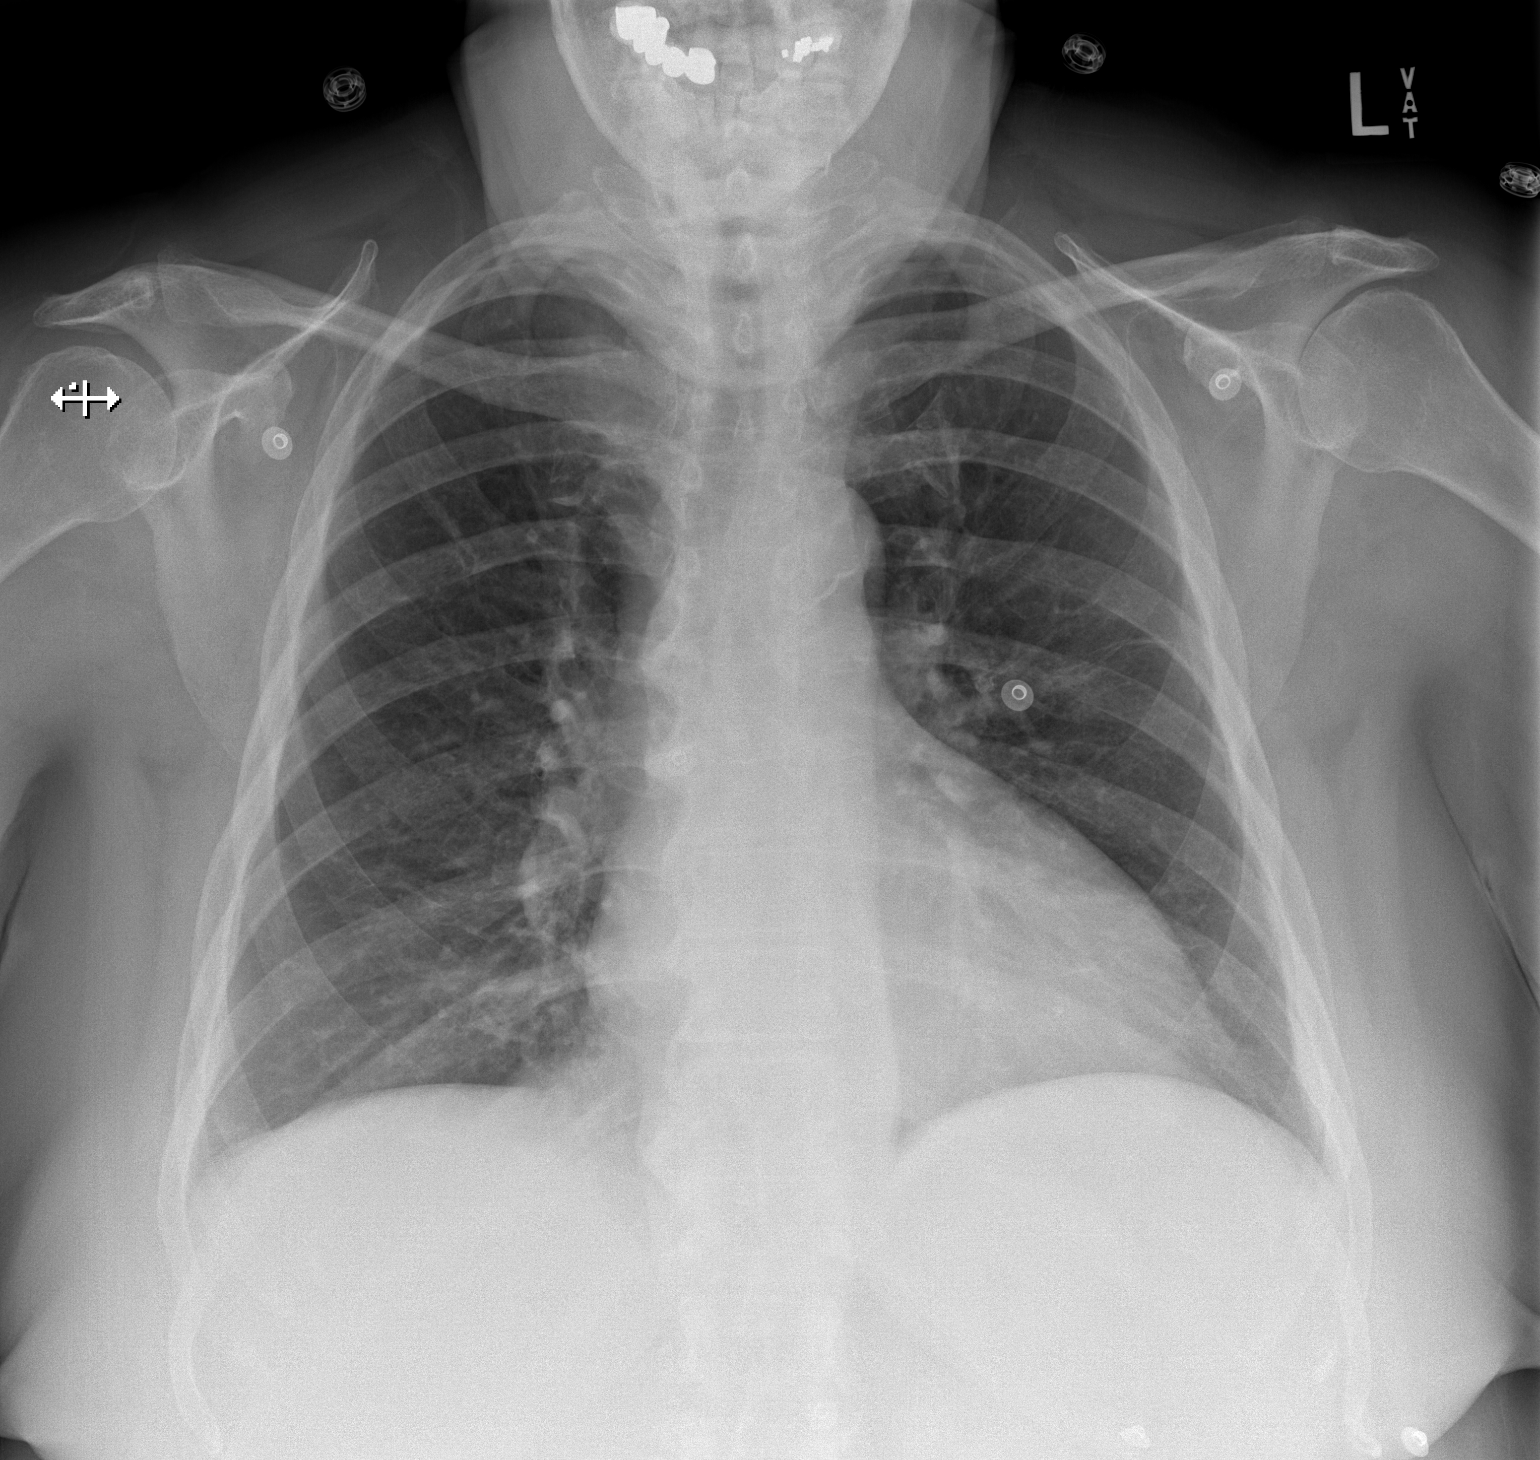

[w chest lat]
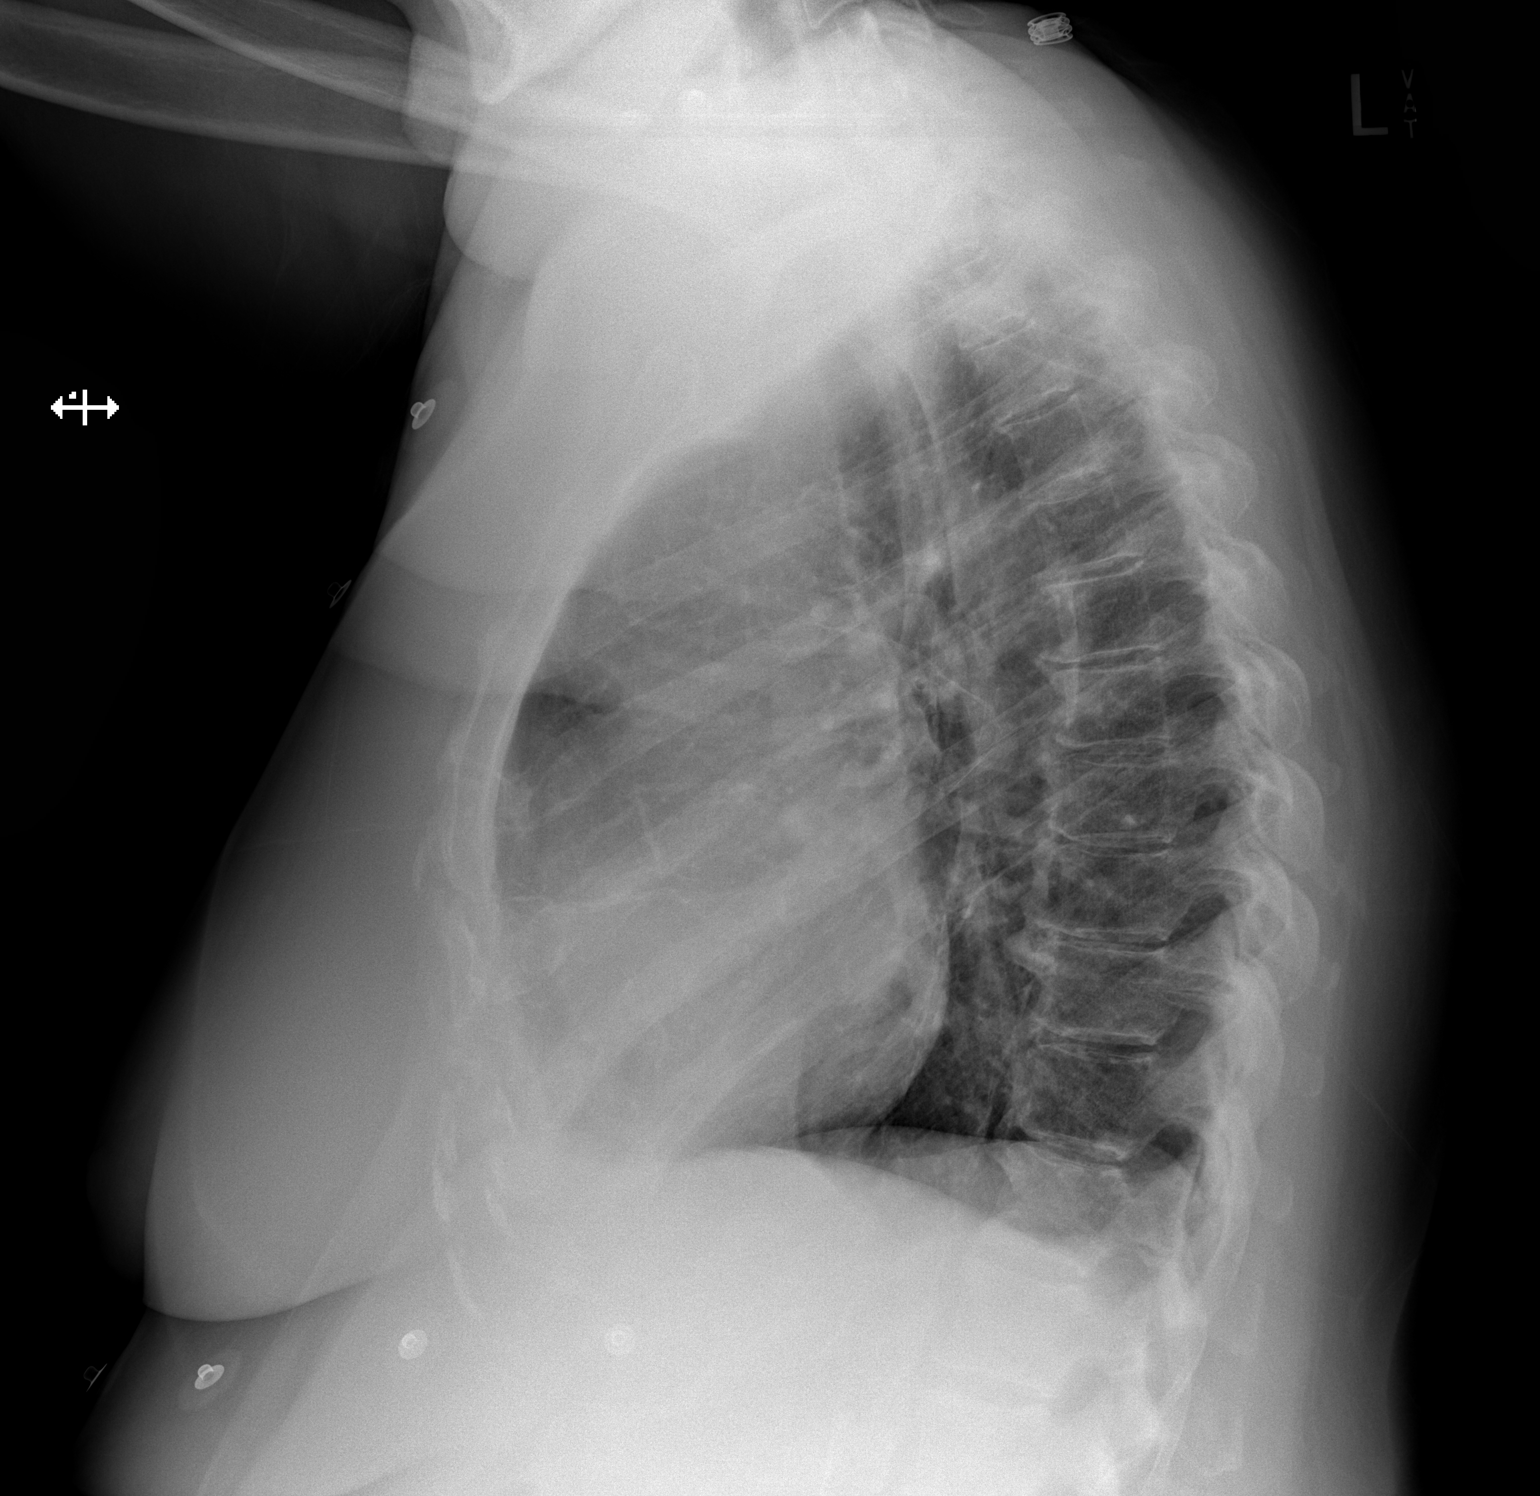

[2 of 2 positions shown; findings below may reference images not displayed]

FINDINGS: Stable mild cardiomegaly. Aortic atherosclerosis. Both lungs are
clear.
IMPRESSION: Stable mild cardiomegaly.  No active lung disease.

## 2020-01-08 ENCOUNTER — Telehealth: Payer: Self-pay | Admitting: Family Medicine

## 2020-01-08 NOTE — Telephone Encounter (Signed)
It will def have to be during lunch as I have patients in the morning.  So I will call Wed during lunch.

## 2020-01-08 NOTE — Telephone Encounter (Signed)
I'm glad to call patient during lunch today.  I'm fully booked Wednesday so not sure how I would accommodate an in office visit for this patient.

## 2020-01-08 NOTE — Telephone Encounter (Signed)
Emily [name] has an appointment here on Wednesday, August 11th, for a diabetic eye exam. She would like to know if she can see Dr. Lajuana Ripple while here for the eye appointment in order to discuss her recent visit and tests results at [facility].

## 2020-01-08 NOTE — Telephone Encounter (Signed)
Pt aware.

## 2020-01-08 NOTE — Telephone Encounter (Signed)
Pt states she couldn't talk over the phone today as she starts work at 46 but could talk in the morning tomorrow or on Wednesday during lunch?

## 2020-01-10 ENCOUNTER — Telehealth: Payer: Self-pay | Admitting: Family Medicine

## 2020-01-10 ENCOUNTER — Other Ambulatory Visit: Payer: Medicare Other

## 2020-01-10 ENCOUNTER — Other Ambulatory Visit: Payer: Self-pay

## 2020-01-10 DIAGNOSIS — D500 Iron deficiency anemia secondary to blood loss (chronic): Secondary | ICD-10-CM | POA: Diagnosis not present

## 2020-01-10 DIAGNOSIS — Z8719 Personal history of other diseases of the digestive system: Secondary | ICD-10-CM

## 2020-01-10 LAB — HEMOGLOBIN, FINGERSTICK: Hemoglobin: 8.6 g/dL — ABNORMAL LOW (ref 11.1–15.9)

## 2020-01-10 LAB — HM DIABETES EYE EXAM

## 2020-01-10 NOTE — Telephone Encounter (Signed)
Discussed recent recommendations by [facility]. I have placed a referral to hematology [facility].  She was previously being seen by Dr. Walden Field, who is no longer working at that facility.  She has had about a 1 point hemoglobin drop from last week to this week.  She does not report any melena, hematochezia.  She is a little tired but not nearly as bad as she was earlier in July.  We will plan to obtain anemia panel to evaluate iron and vitamin B12 levels.  Orders Placed This Encounter  Procedures  . Anemia panel  . Ambulatory referral to Hematology

## 2020-01-11 ENCOUNTER — Other Ambulatory Visit: Payer: Medicare Other

## 2020-01-11 DIAGNOSIS — D500 Iron deficiency anemia secondary to blood loss (chronic): Secondary | ICD-10-CM

## 2020-01-11 DIAGNOSIS — R6889 Other general symptoms and signs: Secondary | ICD-10-CM | POA: Diagnosis not present

## 2020-01-12 LAB — ANEMIA PANEL
Ferritin: 8 ng/mL — ABNORMAL LOW (ref 15–150)
Folate, Hemolysate: >620 ng/mL
Folate, RBC: >2033 ng/mL (ref >498)
Hematocrit: 30.5 % — ABNORMAL LOW (ref 34.0–46.6)
Iron Saturation: 6 % — CL (ref 15–55)
Iron: 21 ug/dL — ABNORMAL LOW (ref 27–139)
Retic Ct Pct: 2.5 % (ref 0.6–2.6)
Total Iron Binding Capacity: 374 ug/dL (ref 250–450)
UIBC: 353 ug/dL (ref 118–369)
Vitamin B-12: 726 pg/mL (ref 232–1245)

## 2020-01-16 ENCOUNTER — Other Ambulatory Visit: Payer: Self-pay

## 2020-01-16 ENCOUNTER — Inpatient Hospital Stay (HOSPITAL_COMMUNITY): Payer: Medicare Other

## 2020-01-16 ENCOUNTER — Inpatient Hospital Stay (HOSPITAL_COMMUNITY): Payer: Medicare Other | Attending: Hematology | Admitting: Hematology

## 2020-01-16 VITALS — BP 144/47 | HR 65 | Temp 98.1°F | Resp 16 | Wt 226.9 lb

## 2020-01-16 DIAGNOSIS — Z809 Family history of malignant neoplasm, unspecified: Secondary | ICD-10-CM | POA: Diagnosis not present

## 2020-01-16 DIAGNOSIS — Z79899 Other long term (current) drug therapy: Secondary | ICD-10-CM | POA: Diagnosis not present

## 2020-01-16 DIAGNOSIS — D508 Other iron deficiency anemias: Secondary | ICD-10-CM

## 2020-01-16 DIAGNOSIS — D509 Iron deficiency anemia, unspecified: Secondary | ICD-10-CM | POA: Diagnosis not present

## 2020-01-16 DIAGNOSIS — Z7984 Long term (current) use of oral hypoglycemic drugs: Secondary | ICD-10-CM | POA: Insufficient documentation

## 2020-01-16 DIAGNOSIS — Z7982 Long term (current) use of aspirin: Secondary | ICD-10-CM | POA: Diagnosis not present

## 2020-01-16 DIAGNOSIS — I4819 Other persistent atrial fibrillation: Secondary | ICD-10-CM | POA: Insufficient documentation

## 2020-01-16 HISTORY — DX: Other persistent atrial fibrillation: I48.19

## 2020-01-16 LAB — CBC WITH DIFFERENTIAL/PLATELET
Abs Immature Granulocytes: 0.06 10*3/uL (ref 0.00–0.07)
Basophils Absolute: 0.1 10*3/uL (ref 0.0–0.1)
Basophils Relative: 1 %
Eosinophils Absolute: 0.2 10*3/uL (ref 0.0–0.5)
Eosinophils Relative: 3 %
HCT: 32.3 % — ABNORMAL LOW (ref 36.0–46.0)
Hemoglobin: 8.9 g/dL — ABNORMAL LOW (ref 12.0–15.0)
Immature Granulocytes: 1 %
Lymphocytes Relative: 10 %
Lymphs Abs: 0.8 10*3/uL (ref 0.7–4.0)
MCH: 22.5 pg — ABNORMAL LOW (ref 26.0–34.0)
MCHC: 27.6 g/dL — ABNORMAL LOW (ref 30.0–36.0)
MCV: 81.8 fL (ref 80.0–100.0)
Monocytes Absolute: 0.6 10*3/uL (ref 0.1–1.0)
Monocytes Relative: 7 %
Neutro Abs: 6.5 10*3/uL (ref 1.7–7.7)
Neutrophils Relative %: 78 %
Platelets: 289 10*3/uL (ref 150–400)
RBC: 3.95 MIL/uL (ref 3.87–5.11)
RDW: 17.6 % — ABNORMAL HIGH (ref 11.5–15.5)
WBC: 8.2 10*3/uL (ref 4.0–10.5)
nRBC: 0 % (ref 0.0–0.2)

## 2020-01-16 LAB — COMPREHENSIVE METABOLIC PANEL
ALT: 15 U/L (ref 0–44)
AST: 18 U/L (ref 15–41)
Albumin: 3.9 g/dL (ref 3.5–5.0)
Alkaline Phosphatase: 85 U/L (ref 38–126)
Anion gap: 11 (ref 5–15)
BUN: 25 mg/dL — ABNORMAL HIGH (ref 8–23)
CO2: 23 mmol/L (ref 22–32)
Calcium: 9.1 mg/dL (ref 8.9–10.3)
Chloride: 101 mmol/L (ref 98–111)
Creatinine, Ser: 0.74 mg/dL (ref 0.44–1.00)
GFR calc Af Amer: >60 mL/min (ref >60)
GFR calc non Af Amer: >60 mL/min (ref >60)
Glucose, Bld: 234 mg/dL — ABNORMAL HIGH (ref 70–99)
Potassium: 4.6 mmol/L (ref 3.5–5.1)
Sodium: 135 mmol/L (ref 135–145)
Total Bilirubin: 0.7 mg/dL (ref 0.3–1.2)
Total Protein: 7.3 g/dL (ref 6.5–8.1)

## 2020-01-16 LAB — RETICULOCYTES
Immature Retic Fract: 26.5 % — ABNORMAL HIGH (ref 2.3–15.9)
RBC.: 3.96 MIL/uL (ref 3.87–5.11)
Retic Count, Absolute: 108.1 10*3/uL (ref 19.0–186.0)
Retic Ct Pct: 2.7 % (ref 0.4–3.1)

## 2020-01-16 LAB — LACTATE DEHYDROGENASE: LDH: 115 U/L (ref 98–192)

## 2020-01-16 NOTE — Patient Instructions (Signed)
[place] at [facility] Discharge Instructions  You were seen today by Dr. Delton Coombes.  He discussed your recent lab work and it shows that your iron levels are extremely low. He wants to give you iron infusions.  These are given in 2 doses and at least one week apart.  We will get you scheduled for these.  We will also do some additional lab work to rule out some other causes of your low blood counts.    We will see you back in 6 weeks.    Thank you for choosing [place] at [facility] to provide your oncology and hematology care.  To afford each patient quality time with our provider, please arrive at least 15 minutes before your scheduled appointment time.   If you have a lab appointment with the [facility] please come in thru the Main Entrance and check in at the main information desk.  You need to re-schedule your appointment should you arrive 10 or more minutes late.  We strive to give you quality time with our providers, and arriving late affects you and other patients whose appointments are after yours.  Also, if you no show three or more times for appointments you may be dismissed from the clinic at the providers discretion.     Again, thank you for choosing [facility].  Our hope is that these requests will decrease the amount of time that you wait before being seen by our physicians.       _____________________________________________________________  Should you have questions after your visit to [facility], please contact our office at [phone] and follow the prompts.  Our office hours are 8:00 a.m. and 4:30 p.m. Monday - Friday.  Please note that voicemails left after 4:00 p.m. may not be returned until the following business day.  We are closed weekends and major holidays.  You do have access to a nurse 24-7, just call the main number to the clinic [phone] and do not press any options, hold on the  line and a nurse will answer the phone.    For prescription refill requests, have your pharmacy contact our office and allow 72 hours.    Due to Covid, you will need to wear a mask upon entering the hospital. If you do not have a mask, a mask will be given to you at the Main Entrance upon arrival. For doctor visits, patients may have 1 support person age 109 or older with them. For treatment visits, patients can not have anyone with them due to social distancing guidelines and our immunocompromised population.

## 2020-01-16 NOTE — Progress Notes (Signed)
Emily [name],  [id number]   CLINIC:  Medical Oncology/Hematology  PCP:  Janora Norlander, DO [place] [address] [phone]   REASON FOR VISIT:  Follow-up for iron deficiency anemia.  PRIOR THERAPY: Parenteral iron therapy.    BRIEF ONCOLOGIC HISTORY:  Oncology History   No history exists.    CANCER STAGING: Cancer Staging No matching staging information was found for the patient.   INTERVAL HISTORY:  Emily [name] 69 y.o. female seen for follow-up of iron deficiency anemia.  She was last seen in our clinic by Dr. Walden Field on 12/28/2017.  Denies any bleeding per rectum or melena.  Reports energy levels of 25% and appetite of 100%.  Has some constipation and could not tolerate iron pills.  Reportedly had colonoscopy in March of this year at [facility].  She was hospitalized in mid April at [facility] for 6 weeks and received 6 units of PRBC.  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    REVIEW OF SYSTEMS:  Review of Systems  Gastrointestinal: Positive for constipation.  All other systems reviewed and are negative.    PAST MEDICAL/SURGICAL HISTORY:  Past Medical History:  Diagnosis Date  . Anemia   . Arthritis   . Atrial fibrillation, rapid ([place]) 10/27/2013  . Bilateral carotid artery disease ([place])   . Diabetes mellitus without complication ([place])   . Difficult intubation    states 'lady that did the sleep study told me I have the smallest airway she has ever seen in an adult"  . Dyslipidemia 06/29/2017  . Dysrhythmia   . Hypertension   . Hypothyroid   . Obesity   . Obstructive sleep apnea    on C Pap  . PAF (paroxysmal atrial fibrillation) ([place])    a. newly dx in 09/2013; on eliquis   Past Surgical History:  Procedure Laterality Date  . CATARACT EXTRACTION Left   . CATARACT EXTRACTION W/PHACO Right 12/09/2015   Procedure: CATARACT EXTRACTION PHACO AND INTRAOCULAR LENS  PLACEMENT (IOC);  Surgeon: Tonny Branch, MD;  Location: [facility];  Service: Ophthalmology;  Laterality: Right;  CDE:10.48  . COLONOSCOPY W/ POLYPECTOMY    . cyst on back of neck removed    . DILATION AND CURETTAGE OF UTERUS     x 5  . ENDARTERECTOMY Left 08/30/2015   Procedure: LEFT CAROYID ENDARTERECTOMY WITH XENOSURE BOVINE PERICARDIUM PATCH ANGIOPLASTY;  Surgeon: [name], MD;  Location: [place];  Service: Vascular;  Laterality: Left;  . EYE SURGERY    . TONSILLECTOMY    . UPPER GI ENDOSCOPY       SOCIAL HISTORY:  Social History   Socioeconomic History  . Marital status: Single    Spouse name: Not on file  . Number of children: Not on file  . Years of education: Not on file  . Highest education level: Not on file  Occupational History  . Not on file  Tobacco Use  . Smoking status: Never Smoker  . Smokeless tobacco: Never Used  Vaping Use  . Vaping Use: Never used  Substance and Sexual Activity  . Alcohol use: No    Alcohol/week: 0.0 standard drinks  . Drug use: Never  . Sexual activity: Never  Other Topics Concern  . Not on file  Social History Narrative  . Not on file   Social Determinants of Health   Financial Resource Strain:   . Difficulty of  Paying Living Expenses:   Food Insecurity:   . Worried About Charity fundraiser in the Last Year:   . Arboriculturist in the Last Year:   Transportation Needs:   . Film/video editor (Medical):   Marland Kitchen Lack of Transportation (Non-Medical):   Physical Activity:   . Days of Exercise per Week:   . Minutes of Exercise per Session:   Stress:   . Feeling of Stress :   Social Connections:   . Frequency of Communication with Friends and Family:   . Frequency of Social Gatherings with Friends and Family:   . Attends Religious Services:   . Active Member of Clubs or Organizations:   . Attends Archivist Meetings:   Marland Kitchen Marital Status:   Intimate Partner Violence:   . Fear of Current or Ex-Partner:   .  Emotionally Abused:   Marland Kitchen Physically Abused:   . Sexually Abused:     FAMILY HISTORY:  Family History  Problem Relation Age of Onset  . COPD Father   . Heart failure Father   . Heart disease Father   . Emphysema Father   . Arrhythmia Sister   . Arrhythmia Sister   . Arrhythmia Sister        had PPM also  . Cancer Sister     CURRENT MEDICATIONS:  Outpatient Encounter Medications as of 01/16/2020  Medication Sig  . ACCU-CHEK GUIDE test strip USE TO CHECK BLOOD GLUCOSE ONCE DAILY DX E11.9  . aspirin EC 81 MG tablet Take by mouth.  . blood glucose meter kit and supplies Dispense based on patient and insurance preference. Use up to four times daily as directed. E11.9  . chlorthalidone (HYGROTON) 25 MG tablet TAKE 1 TABLET BY MOUTH EVERY DAY  . CRANBERRY PO Take 1 tablet by mouth 2 (two) times daily.  Marland Kitchen diltiazem (CARDIZEM CD) 180 MG 24 hr capsule TAKE 1 CAPSULE BY MOUTH EVERY DAY (Patient taking differently: only takes when heart is out of rhythm)  . empagliflozin (JARDIANCE) 25 MG TABS tablet Take 25 mg by mouth daily before breakfast.  . ezetimibe (ZETIA) 10 MG tablet TAKE 1 TABLET BY MOUTH EVERY DAY  . flecainide (TAMBOCOR) 150 MG tablet TAKE 0.5 TABLETS (75 MG TOTAL) BY MOUTH 2 (TWO) TIMES DAILY.  . furosemide (LASIX) 20 MG tablet Take 1 tablet (20 mg total) by mouth daily as needed for fluid.  Marland Kitchen glipiZIDE (GLUCOTROL) 5 MG tablet Take 1 tablet (5 mg total) by mouth 2 (two) times daily.  Marland Kitchen ketoconazole (NIZORAL) 2 % cream Apply 1 application topically daily. x7-10 days (for skin yeast)  . Lancet Device MISC Use to test sugar once daily E11.9  . levothyroxine (SYNTHROID) 112 MCG tablet TAKE 1 TABLET BY MOUTH EVERY OTHER DAY, ALTERNATING WITH 1 & 1/2 TABLETS EVERY OTHER DAY  . losartan (COZAAR) 25 MG tablet TAKE 1 TABLET BY MOUTH EVERY DAY  . lovastatin (MEVACOR) 40 MG tablet TAKE 1 TABLET BY MOUTH EVERY DAY IN THE EVENING  . metFORMIN (GLUCOPHAGE) 1000 MG tablet TAKE 1 TABLET (1,000  MG TOTAL) BY MOUTH 2 (TWO) TIMES DAILY WITH A MEAL.  . metoprolol succinate (TOPROL-XL) 100 MG 24 hr tablet Take 1 tablet (100 mg total) by mouth daily. Take with or immediately following a meal.  . rOPINIRole (REQUIP) 0.5 MG tablet TAKE 1 TABLET (0.5 MG TOTAL) BY MOUTH AT BEDTIME.  Marland Kitchen triamcinolone cream (KENALOG) 0.1 % Apply 1 application topically 2 (two) times daily. To  neck x7-10d  . [DISCONTINUED] diltiazem (CARDIZEM) 30 MG tablet TAKE 1 TABLET BY MOUTH EVERY DAY (Patient taking differently: Take only when heart is out of rhythm)  . pantoprazole (PROTONIX) 40 MG tablet Take by mouth.  (Patient not taking: Reported on 01/16/2020)  . Probiotic Product (PROBIOTIC-10 PO) Take by mouth. (Patient not taking: Reported on 01/16/2020)   Facility-Administered Encounter Medications as of 01/16/2020  Medication  . cyanocobalamin ((VITAMIN B-12)) injection 1,000 mcg    ALLERGIES:  Allergies  Allergen Reactions  . Quinapril Hcl Cough  . Statins Other (See Comments)    Not all Statins but some cause cough and pain in legs.  . Tape Other (See Comments)    Redness, please use "paper" tape     PHYSICAL EXAM:  ECOG Performance status: 0  Vitals:   01/16/20 0812  BP: (!) 144/47  Pulse: 65  Resp: 16  Temp: 98.1 F (36.7 C)  SpO2: 100%   Filed Weights   01/16/20 0812  Weight: 226 lb 14.4 oz (102.9 kg)   Physical Exam Vitals reviewed.  Constitutional:      Appearance: Normal appearance.  Cardiovascular:     Rate and Rhythm: Normal rate and regular rhythm.     Heart sounds: Normal heart sounds.  Pulmonary:     Effort: Pulmonary effort is normal.     Breath sounds: Normal breath sounds.  Abdominal:     General: There is no distension.     Palpations: Abdomen is soft. There is no mass.  Lymphadenopathy:     Cervical: No cervical adenopathy.  Neurological:     General: No focal deficit present.     Mental Status: She is alert and oriented to person, place, and time.  Psychiatric:          Mood and Affect: Mood normal.        Behavior: Behavior normal.      LABORATORY DATA:  I have reviewed the labs as listed.  CBC    Component Value Date/Time   WBC 8.2 01/16/2020 0847   RBC 3.95 01/16/2020 0847   RBC 3.96 01/16/2020 0847   HGB 8.9 (L) 01/16/2020 0847   HGB 11.3 05/15/2019 1430   HCT 32.3 (L) 01/16/2020 0847   HCT 30.5 (L) 01/11/2020 0823   PLT 289 01/16/2020 0847   PLT 290 05/15/2019 1430   MCV 81.8 01/16/2020 0847   MCV 86 05/15/2019 1430   MCH 22.5 (L) 01/16/2020 0847   MCHC 27.6 (L) 01/16/2020 0847   RDW 17.6 (H) 01/16/2020 0847   RDW 15.0 05/15/2019 1430   LYMPHSABS 0.8 01/16/2020 0847   LYMPHSABS 1.4 07/27/2017 1418   MONOABS 0.6 01/16/2020 0847   EOSABS 0.2 01/16/2020 0847   EOSABS 0.2 07/27/2017 1418   BASOSABS 0.1 01/16/2020 0847   BASOSABS 0.0 07/27/2017 1418   CMP Latest Ref Rng & Units 01/16/2020 10/03/2019 09/08/2019  Glucose 70 - 99 mg/dL 234(H) 271(H) 167(H)  BUN 8 - 23 mg/dL 25(H) 28(H) 24  Creatinine 0.44 - 1.00 mg/dL 0.74 0.79 0.82  Sodium 135 - 145 mmol/L 135 135 138  Potassium 3.5 - 5.1 mmol/L 4.6 3.8 4.2  Chloride 98 - 111 mmol/L 101 95(L) 98  CO2 22 - 32 mmol/L 23 24 23   Calcium 8.9 - 10.3 mg/dL 9.1 9.0 9.2  Total Protein 6.5 - 8.1 g/dL 7.3 - 6.7  Total Bilirubin 0.3 - 1.2 mg/dL 0.7 - 0.7  Alkaline Phos 38 - 126 U/L 85 - 114  AST 15 -  41 U/L 18 - 17  ALT 0 - 44 U/L 15 - 11    DIAGNOSTIC IMAGING:  I have independently reviewed the scans and discussed with the patient.  ASSESSMENT:  1.  Iron deficiency anemia: -Labs on 01/11/2020 shows ferritin of 8, iron saturation of 6 with normal B86 and folic acid levels.  Hematocrit was 30.5.  Prior hemoglobin was 8.6 on 01/10/2020. -Colonoscopy on 08/30/2019 at [facility] showed medium sized lipoma in the transverse colon with no other lesions. -Patient reportedly hospitalized at [facility] in mid April and received 6 units of PRBC. -Double-balloon enteroscopy on 10/27/2019 shows  normal esophagus, stomach, duodenum and jejunum. -She cannot tolerate iron pills due to severe constipation.  She last received Injectafer on 01/05/2018 and 01/12/2018. -Likely etiology is blood loss from GI tract.    PLAN:  1.  Iron deficiency anemia: -We will check her CBC today.  We will also check methylmalonic acid, copper, LDH, reticulocyte count, and haptoglobin. -We will also check SPEP to evaluate for monoclonal gammopathy. -I have recommended weekly Feraheme x2 as her ferritin levels are very low and she cannot tolerate oral iron secondary to constipation. -We will see her back in 6 to 8 weeks for follow-up with repeat labs.     Orders placed this encounter:  Orders Placed This Encounter  Procedures  . CBC with Differential/Platelet  . Comprehensive metabolic panel  . Copper, serum  . Methylmalonic acid, serum  . Lactate dehydrogenase  . Reticulocytes  . Haptoglobin  . Protein electrophoresis, serum      Derek Jack, MD [place] [phone]

## 2020-01-16 NOTE — Progress Notes (Signed)
Cardiology Office Note   Date:  01/17/2020   ID:  Emily [name], DOB 1950/09/29, MRN [id number]  PCP:  Emily Norlander, DO  Cardiologist:   Emily Breeding, MD   Chief Complaint  Patient presents with  . Atrial Fibrillation      History of Present Illness: Emily [name] is a 69 y.o. female who presents for followup of atrial fib.   The patient has paroxysmal atrial fibrillation and was treated with flecainide. She has had recurrent GI bleeding and could not tolerate anticoagulation.  She was getting follow-up of her watchman device at [facility].   In December she had PAF and called EMS.  She had GI bleeding again and she was in the hospital in April for this.  She has had a complete work-up and not been able to identify a source.  She has had transfusions.    Since I saw her she has been doing for double-balloon enteroscopy.  I reviewed these records.  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  Her hemoglobin is about steady at 8.9 and she is going to get blood transfusions.  She otherwise has had no new complaints.  She did have TEE in February to follow-up for watchman and I reviewed this as well.  Most recently she had a transthoracic echo with and her ejection fraction is well-preserved.  She had moderate aortic valve stenosis.  There is some mitral regurgitation but it was trivial on transthoracic but more and slightly advanced transesophageal.  She is not complaining of any new GI bleeding.  She has had no new symptomatic paroxysms of atrial fibrillation.  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  She just stops what she is doing.  She is not getting any chest pressure, neck or arm discomfort.  She has had no PND or orthopnea.    Past Medical History:  Diagnosis Date  . Anemia   . Arthritis   . Atrial fibrillation, rapid ([place]) 10/27/2013  . Bilateral carotid artery disease ([place])   . Diabetes mellitus without complication  ([place])   . Difficult intubation    states 'lady that did the sleep study told me I have the smallest airway she has ever seen in an adult"  . Dyslipidemia 06/29/2017  . Dysrhythmia   . Hypertension   . Hypothyroid   . Obesity   . Obstructive sleep apnea    on C Pap  . PAF (paroxysmal atrial fibrillation) ([place])    a. newly dx in 09/2013; on eliquis    Past Surgical History:  Procedure Laterality Date  . CATARACT EXTRACTION Left   . CATARACT EXTRACTION W/PHACO Right 12/09/2015   Procedure: CATARACT EXTRACTION PHACO AND INTRAOCULAR LENS PLACEMENT (IOC);  Surgeon: Tonny Branch, MD;  Location: [facility];  Service: Ophthalmology;  Laterality: Right;  CDE:10.48  . COLONOSCOPY W/ POLYPECTOMY    . cyst on back of neck removed    . DILATION AND CURETTAGE OF UTERUS     x 5  . ENDARTERECTOMY Left 08/30/2015   Procedure: LEFT CAROYID ENDARTERECTOMY WITH XENOSURE BOVINE PERICARDIUM PATCH ANGIOPLASTY;  Surgeon: [name], MD;  Location: [place];  Service: Vascular;  Laterality: Left;  . EYE SURGERY    . TONSILLECTOMY    . UPPER GI ENDOSCOPY       Current Outpatient Medications  Medication Sig Dispense Refill  . aspirin EC 81 MG tablet Take by mouth.    Emily [name]  PO Take 1 tablet by mouth 2 (two) times daily.    Marland Kitchen diltiazem (CARDIZEM CD) 180 MG 24 hr capsule TAKE 1 CAPSULE BY MOUTH EVERY DAY 90 capsule 1  . empagliflozin (JARDIANCE) 25 MG TABS tablet Take 25 mg by mouth daily before breakfast. 30 tablet 11  . ezetimibe (ZETIA) 10 MG tablet TAKE 1 TABLET BY MOUTH EVERY DAY 90 tablet 1  . flecainide (TAMBOCOR) 150 MG tablet TAKE 0.5 TABLETS (75 MG TOTAL) BY MOUTH 2 (TWO) TIMES DAILY. 90 tablet 3  . furosemide (LASIX) 20 MG tablet Take 1 tablet (20 mg total) by mouth daily as needed for fluid. 90 tablet 0  . glipiZIDE (GLUCOTROL) 5 MG tablet Take 1 tablet (5 mg total) by mouth 2 (two) times daily. 180 tablet 3  . levothyroxine (SYNTHROID) 112 MCG tablet TAKE 1 TABLET BY MOUTH EVERY OTHER DAY,  ALTERNATING WITH 1 & 1/2 TABLETS EVERY OTHER DAY 112 tablet 3  . losartan (COZAAR) 25 MG tablet TAKE 1 TABLET BY MOUTH EVERY DAY 90 tablet 1  . lovastatin (MEVACOR) 40 MG tablet TAKE 1 TABLET BY MOUTH EVERY DAY IN THE EVENING 90 tablet 3  . metFORMIN (GLUCOPHAGE) 1000 MG tablet TAKE 1 TABLET (1,000 MG TOTAL) BY MOUTH 2 (TWO) TIMES DAILY WITH A MEAL. 180 tablet 0  . metoprolol succinate (TOPROL-XL) 100 MG 24 hr tablet Take 1 tablet (100 mg total) by mouth daily. Take with or immediately following a meal. 90 tablet 3  . rOPINIRole (REQUIP) 0.5 MG tablet TAKE 1 TABLET (0.5 MG TOTAL) BY MOUTH AT BEDTIME. 90 tablet 0  . triamcinolone cream (KENALOG) 0.1 % Apply 1 application topically 2 (two) times daily. To neck x7-10d 30 g 0  . ACCU-CHEK GUIDE test strip USE TO CHECK BLOOD GLUCOSE ONCE DAILY DX E11.9 100 strip 3  . blood glucose meter kit and supplies Dispense based on patient and insurance preference. Use up to four times daily as directed. E11.9 1 each 0  . Lancet Device MISC Use to test sugar once daily E11.9 100 each 12   Current Facility-Administered Medications  Medication Dose Route Frequency Provider Last Rate Last Admin  . cyanocobalamin ((VITAMIN B-12)) injection 1,000 mcg  1,000 mcg Intramuscular Q30 days Emily Euler, MD   1,000 mcg at 12/29/19 1010    Allergies:   Quinapril hcl, Statins, and Tape   ROS:  Please see the history of present illness.   Otherwise, review of systems are positive for none.   All other systems are reviewed and negative.    PHYSICAL EXAM: VS:  BP 140/64   Pulse 71   Ht 5' 2.5" (1.588 m)   Wt 225 lb (102.1 kg)   BMI 40.50 kg/m  , BMI Body mass index is 40.5 kg/m. GENERAL:  Well appearing NECK:  No jugular venous distention, waveform within normal limits, carotid upstroke brisk and symmetric, no bruits, no thyromegaly LUNGS:  Clear to auscultation bilaterally CHEST:  Unremarkable HEART:  PMI not displaced or sustained,S1 and S2 within normal  limits, no S3, no S4, no clicks, no rubs, 2 out of 6 apical systolic murmur radiating slightly at the aortic outflow tract, no diastolic murmurs ABD:  Flat, positive bowel sounds normal in frequency in pitch, no bruits, no rebound, no guarding, no midline pulsatile mass, no hepatomegaly, no splenomegaly EXT:  2 plus pulses throughout, trace bilateral lower extremity edema, no cyanosis no clubbing     EKG:  EKG is ordered today. The ekg ordered  today demonstrates sinus rhythm, rate 71, left axis deviation, interventricular conduction delay, left ventricular hypertrophy, no acute ST-T wave changes.   Recent Labs: 11/20/2019: TSH 4.110 01/16/2020: ALT 15; BUN 25; Creatinine, Ser 0.74; Hemoglobin 8.9; Platelets 289; Potassium 4.6; Sodium 135    Lipid Panel    Component Value Date/Time   CHOL 145 09/08/2019 1015   TRIG 156 (H) 09/08/2019 1015   HDL 37 (L) 09/08/2019 1015   CHOLHDL 3.9 09/08/2019 1015   LDLCALC 81 09/08/2019 1015      Wt Readings from Last 3 Encounters:  01/17/20 225 lb (102.1 kg)  01/16/20 226 lb 14.4 oz (102.9 kg)  12/19/19 226 lb 9.6 oz (102.8 kg)      Other studies Reviewed: Additional studies/ records that were reviewed today include: [facility] and [facility] records. Review of the above records demonstrates:  Please see elsewhere in the note.     ASSESSMENT AND PLAN:  ATRIAL FIB:   .  Ms. Tarry Riederer has a CHA2DS2 - VASc score of 4.    She tolerates aspirin.  She is not able to take anticoagulation.  She has a Forensic scientist placed.  She is maintaining sinus rhythm with flecainide.  No change in therapy.  EKG measurements are chronically unchanged.  GI BLEEDING:    She is going get iron transfusions.  Hemoglobin is stable.  HTN:   The blood pressure is at target.  No change  CAROTID STENOSIS:  She had 60 - 79% right stenosis and left 40 - 59% stenosis in March of this year.  I will follow this again this fall at [facility]      OBESITY:  She is encouraged to continue  weight loss.   DM: A1c was now 6.8 down from 7.8.  I will defer to her primary provider.   AS/LVH:   She had moderate AS she had normal LVEF.  I'll probably repeat this in about 18 months.  COVID EDUCATION: She has had her vaccines.  We talked about the booster.      Current medicines are reviewed at length with the patient today.  The patient does not have concerns regarding medicines.  The following changes have been made:  no change  Labs/ tests ordered today include: None  Orders Placed This Encounter  Procedures  . EKG 12-Lead     Disposition:   FU with me in one year.     Signed, Emily Breeding, MD  01/17/2020 10:06 AM    [facility]

## 2020-01-17 ENCOUNTER — Encounter: Payer: Self-pay | Admitting: Cardiology

## 2020-01-17 ENCOUNTER — Ambulatory Visit: Payer: Medicare Other | Admitting: Cardiology

## 2020-01-17 VITALS — BP 140/64 | HR 71 | Ht 62.5 in | Wt 225.0 lb

## 2020-01-17 DIAGNOSIS — I6523 Occlusion and stenosis of bilateral carotid arteries: Secondary | ICD-10-CM | POA: Diagnosis not present

## 2020-01-17 DIAGNOSIS — I4819 Other persistent atrial fibrillation: Secondary | ICD-10-CM | POA: Diagnosis not present

## 2020-01-17 DIAGNOSIS — E118 Type 2 diabetes mellitus with unspecified complications: Secondary | ICD-10-CM

## 2020-01-17 DIAGNOSIS — I10 Essential (primary) hypertension: Secondary | ICD-10-CM | POA: Diagnosis not present

## 2020-01-17 LAB — PROTEIN ELECTROPHORESIS, SERUM
A/G Ratio: 1.1 (ref 0.7–1.7)
Albumin ELP: 3.6 g/dL (ref 2.9–4.4)
Alpha-1-Globulin: 0.1 g/dL (ref 0.0–0.4)
Alpha-2-Globulin: 0.9 g/dL (ref 0.4–1.0)
Beta Globulin: 1.1 g/dL (ref 0.7–1.3)
Gamma Globulin: 1.2 g/dL (ref 0.4–1.8)
Globulin, Total: 3.4 g/dL (ref 2.2–3.9)
Total Protein ELP: 7 g/dL (ref 6.0–8.5)

## 2020-01-17 LAB — HAPTOGLOBIN: Haptoglobin: 108 mg/dL (ref 37–355)

## 2020-01-17 NOTE — Patient Instructions (Signed)
Medication Instructions:  The current medical regimen is effective;  continue present plan and medications.  *If you need a refill on your cardiac medications before your next appointment, please call your pharmacy*  Follow-Up: At [facility], you and your health needs are our priority.  As part of our continuing mission to provide you with exceptional heart care, we have created designated Provider Care Teams.  These Care Teams include your primary Cardiologist (physician) and Advanced Practice Providers (APPs -  Physician Assistants and Nurse Practitioners) who all work together to provide you with the care you need, when you need it.  We recommend signing up for the patient portal called "MyChart".  Sign up information is provided on this After Visit Summary.  MyChart is used to connect with patients for Virtual Visits (Telemedicine).  Patients are able to view lab/test results, encounter notes, upcoming appointments, etc.  Non-urgent messages can be sent to your provider as well.   To learn more about what you can do with MyChart, go to [email or web address].    Your next appointment:   12 month(s)  The format for your next appointment:   In Person  Provider:   James Hochrein, MD   Thank you for choosing [facility]!!     

## 2020-01-19 LAB — METHYLMALONIC ACID, SERUM: Methylmalonic Acid, Quantitative: 211 nmol/L (ref 0–378)

## 2020-01-19 LAB — COPPER, SERUM: Copper: 116 ug/dL (ref 80–158)

## 2020-01-22 ENCOUNTER — Other Ambulatory Visit: Payer: Medicare Other

## 2020-01-22 ENCOUNTER — Other Ambulatory Visit: Payer: Self-pay

## 2020-01-22 DIAGNOSIS — D500 Iron deficiency anemia secondary to blood loss (chronic): Secondary | ICD-10-CM

## 2020-01-22 LAB — HEMOGLOBIN, FINGERSTICK: Hemoglobin: 10 g/dL — ABNORMAL LOW (ref 11.1–15.9)

## 2020-01-22 IMAGING — DX DG LUMBAR SPINE 2-3V
2 series · 2 of 2 positions shown · non-contrast
Comparison: CT abdomen pelvis 05/26/2014

CLINICAL DATA: Left leg pain

EXAM:
LUMBAR SPINE - 2-3 VIEW

[l-spine ap]
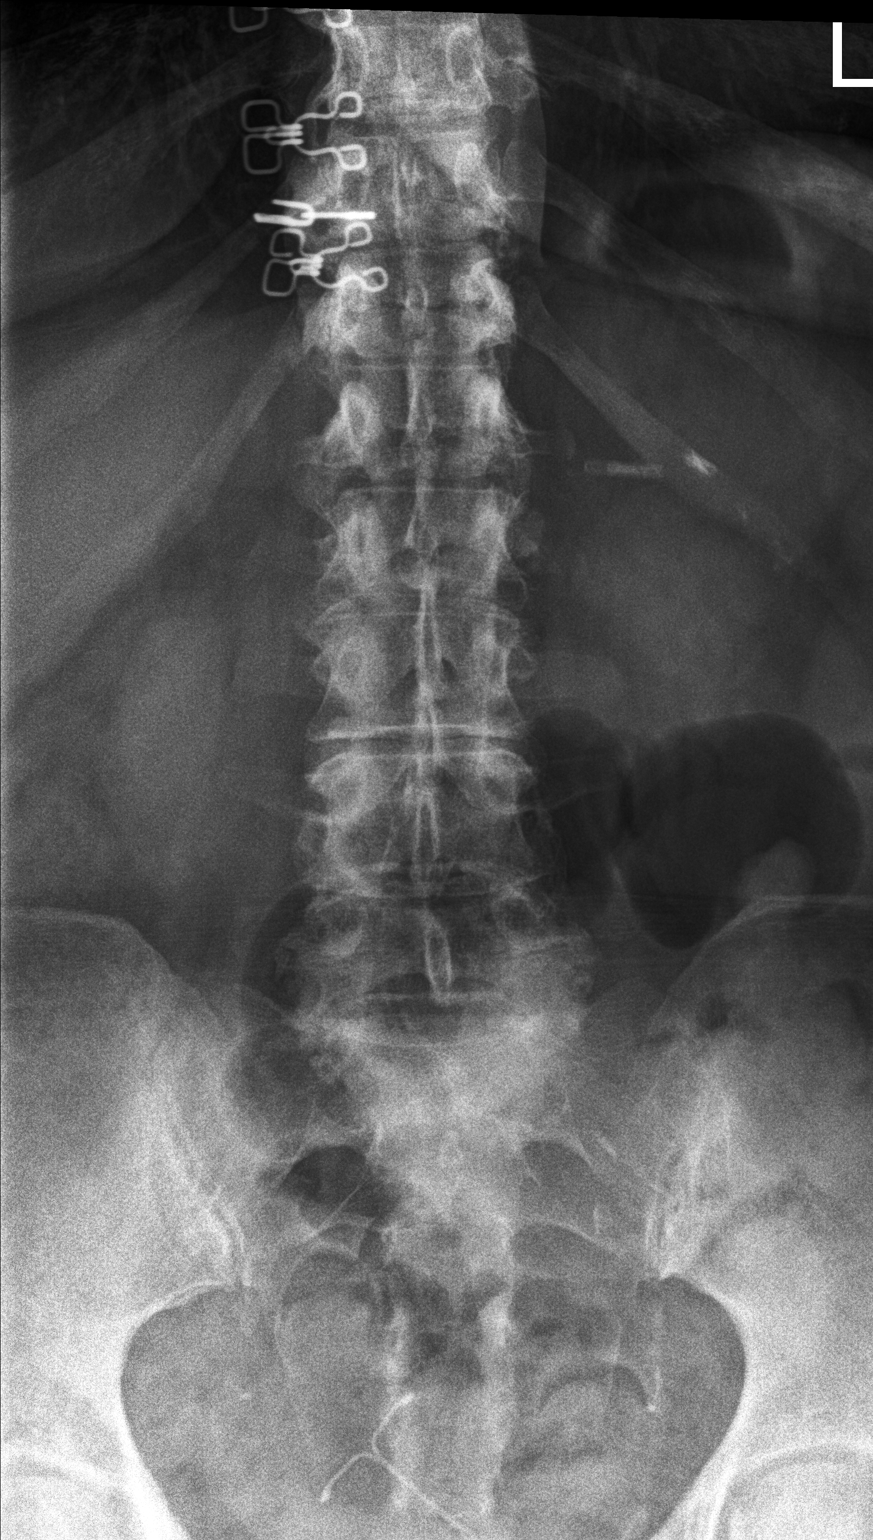

[l-spine lat]
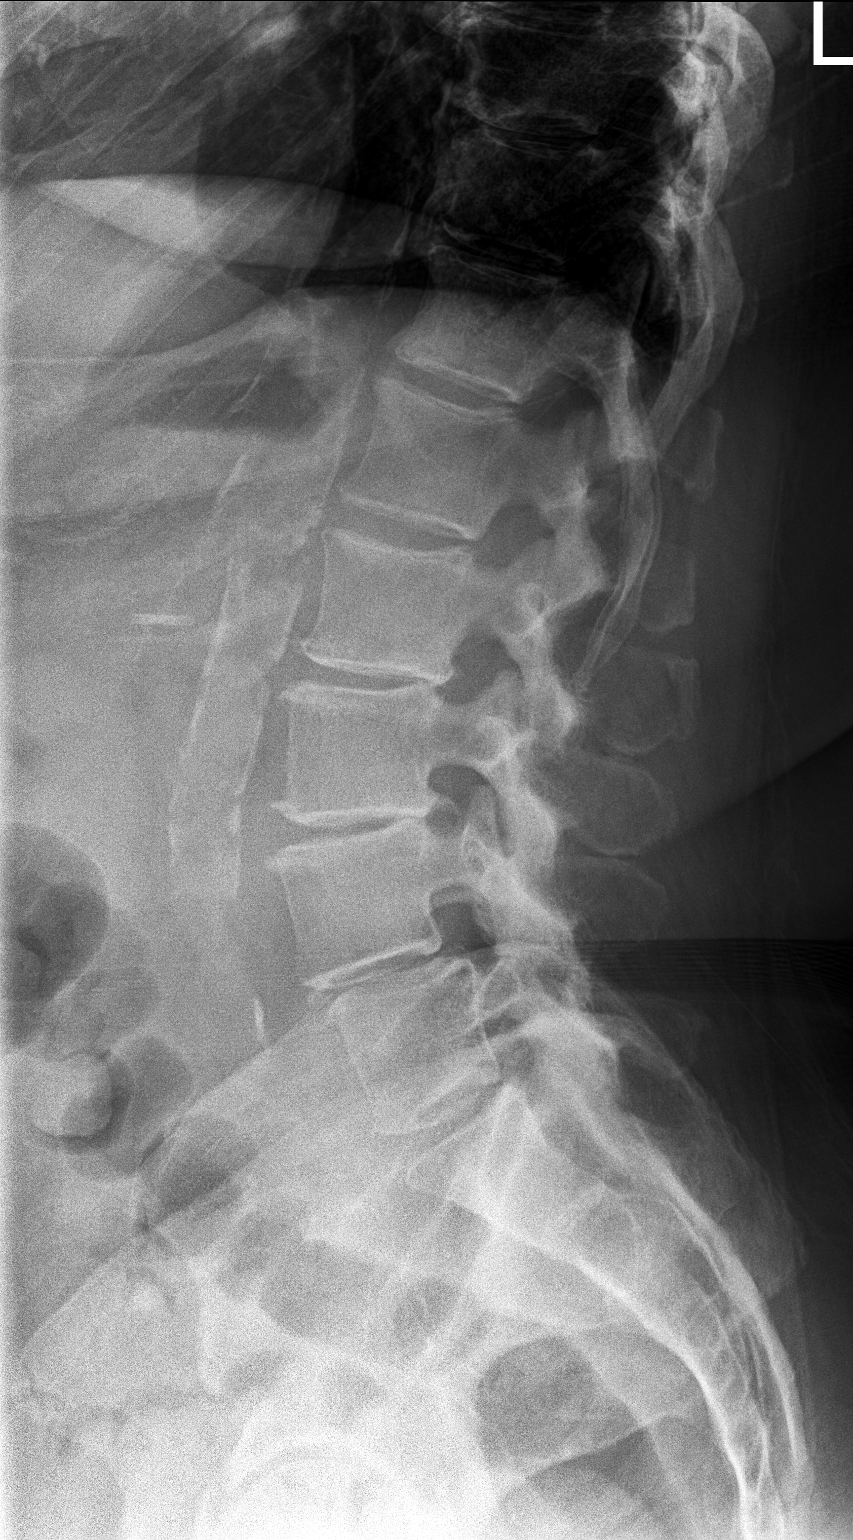

[2 of 2 positions shown; findings below may reference images not displayed]

FINDINGS: There is approximately 5 mm anterolisthesis of L4 on L5 which
appears stable compared to sagittal images from CT of 0726. There is
diffuse degenerative disc disease most marked at L2-3, L3-4, and
L4-5 levels. There is degenerative change of the facet joints of the
lower lumbar spine. No compression deformity is seen. Moderate
abdominal aortic atherosclerosis is noted. The SI joints appear
corticated. IUD is noted in the mid pelvis.
IMPRESSION: 1. Stable 5 mm anterolisthesis of L4 on L5 which appears to be due
to degenerative change of the facet joints.
2. Degenerative disc disease primarily at L2-3, L3-4, and L4-5
levels.

## 2020-01-23 ENCOUNTER — Inpatient Hospital Stay (HOSPITAL_COMMUNITY): Payer: Medicare Other

## 2020-01-23 VITALS — BP 135/41 | HR 58 | Temp 97.0°F | Resp 18

## 2020-01-23 DIAGNOSIS — Z7984 Long term (current) use of oral hypoglycemic drugs: Secondary | ICD-10-CM | POA: Diagnosis not present

## 2020-01-23 DIAGNOSIS — Z7982 Long term (current) use of aspirin: Secondary | ICD-10-CM | POA: Diagnosis not present

## 2020-01-23 DIAGNOSIS — D509 Iron deficiency anemia, unspecified: Secondary | ICD-10-CM | POA: Diagnosis not present

## 2020-01-23 DIAGNOSIS — Z809 Family history of malignant neoplasm, unspecified: Secondary | ICD-10-CM | POA: Diagnosis not present

## 2020-01-23 DIAGNOSIS — Z79899 Other long term (current) drug therapy: Secondary | ICD-10-CM | POA: Diagnosis not present

## 2020-01-23 MED ORDER — SODIUM CHLORIDE 0.9 % IV SOLN: Freq: Once | INTRAVENOUS | Status: AC

## 2020-01-23 MED ORDER — SODIUM CHLORIDE 0.9 % IV SOLN
510.0000 mg | Freq: Once | INTRAVENOUS | Status: AC
  Administered 2020-01-23: 510 mg via INTRAVENOUS
  Filled 2020-01-23: qty 510

## 2020-01-23 NOTE — Progress Notes (Signed)
Iron infusion given per orders. Patient tolerated it well without problems. Vitals stable and discharged home from clinic ambulatory. Follow up as scheduled.  

## 2020-01-23 NOTE — Patient Instructions (Signed)
[facility] at [facility]  Discharge Instructions:   _______________________________________________________________  Thank you for choosing [facility] at [facility] to provide your oncology and hematology care.  To afford each patient quality time with our providers, please arrive at least 15 minutes before your scheduled appointment.  You need to re-schedule your appointment if you arrive 10 or more minutes late.  We strive to give you quality time with our providers, and arriving late affects you and other patients whose appointments are after yours.  Also, if you no show three or more times for appointments you may be dismissed from the clinic.  Again, thank you for choosing  [facility] at [facility]. Our hope is that these requests will allow you access to exceptional care and in a timely manner. _______________________________________________________________  If you have questions after your visit, please contact our office at [phone] between the hours of 8:30 a.m. and 5:00 p.m. Voicemails left after 4:30 p.m. will not be returned until the following business day. _______________________________________________________________  For prescription refill requests, have your pharmacy contact our office. _______________________________________________________________  Recommendations made by the consultant and any test results will be sent to your referring physician. _______________________________________________________________ 

## 2020-01-30 ENCOUNTER — Inpatient Hospital Stay (HOSPITAL_COMMUNITY): Payer: Medicare Other

## 2020-01-30 ENCOUNTER — Encounter (HOSPITAL_COMMUNITY): Payer: Self-pay

## 2020-01-30 ENCOUNTER — Ambulatory Visit (INDEPENDENT_AMBULATORY_CARE_PROVIDER_SITE_OTHER): Payer: Medicare Other | Admitting: *Deleted

## 2020-01-30 ENCOUNTER — Other Ambulatory Visit: Payer: Self-pay

## 2020-01-30 VITALS — BP 160/75 | HR 61 | Temp 97.0°F | Resp 18

## 2020-01-30 DIAGNOSIS — Z7984 Long term (current) use of oral hypoglycemic drugs: Secondary | ICD-10-CM | POA: Diagnosis not present

## 2020-01-30 DIAGNOSIS — Z7982 Long term (current) use of aspirin: Secondary | ICD-10-CM | POA: Diagnosis not present

## 2020-01-30 DIAGNOSIS — D509 Iron deficiency anemia, unspecified: Secondary | ICD-10-CM | POA: Diagnosis not present

## 2020-01-30 DIAGNOSIS — Z79899 Other long term (current) drug therapy: Secondary | ICD-10-CM | POA: Diagnosis not present

## 2020-01-30 DIAGNOSIS — E538 Deficiency of other specified B group vitamins: Secondary | ICD-10-CM

## 2020-01-30 DIAGNOSIS — Z809 Family history of malignant neoplasm, unspecified: Secondary | ICD-10-CM | POA: Diagnosis not present

## 2020-01-30 MED ORDER — SODIUM CHLORIDE 0.9 % IV SOLN
510.0000 mg | Freq: Once | INTRAVENOUS | Status: AC
  Administered 2020-01-30: 510 mg via INTRAVENOUS
  Filled 2020-01-30: qty 510

## 2020-01-30 MED ORDER — SODIUM CHLORIDE 0.9 % IV SOLN: Freq: Once | INTRAVENOUS | Status: AC

## 2020-01-30 NOTE — Progress Notes (Signed)
Pt given B12 injection IM left deltoid and tolerated well. °

## 2020-01-30 NOTE — Patient Instructions (Signed)

## 2020-01-30 NOTE — Progress Notes (Signed)
Emily [name] presents today for IV iron infusion. Infusion tolerated without incident or complaint. See MAR for details. VSS prior to and post infusion. Discharged in satisfactory condition with follow up instructions.

## 2020-01-30 NOTE — Patient Instructions (Signed)
[place] at [facility]  Discharge Instructions:  Iv iron received today. _______________________________________________________________  Thank you for choosing [place] at [facility] to provide your oncology and hematology care.  To afford each patient quality time with our providers, please arrive at least 15 minutes before your scheduled appointment.  You need to re-schedule your appointment if you arrive 10 or more minutes late.  We strive to give you quality time with our providers, and arriving late affects you and other patients whose appointments are after yours.  Also, if you no show three or more times for appointments you may be dismissed from the clinic.  Again, thank you for choosing [place] at [place] hope is that these requests will allow you access to exceptional care and in a timely manner. _______________________________________________________________  If you have questions after your visit, please contact our office at [phone] between the hours of 8:30 a.m. and 5:00 p.m. Voicemails left after 4:30 p.m. will not be returned until the following business day. _______________________________________________________________  For prescription refill requests, have your pharmacy contact our office. _______________________________________________________________  Recommendations made by the consultant and any test results will be sent to your referring physician. _______________________________________________________________

## 2020-02-04 ENCOUNTER — Other Ambulatory Visit: Payer: Self-pay | Admitting: Family Medicine

## 2020-02-04 ENCOUNTER — Emergency Department (HOSPITAL_COMMUNITY)
Admission: EM | Admit: 2020-02-04 | Discharge: 2020-02-04 | Disposition: A | Discharge status: Home / Self Care | Payer: Medicare Other | Attending: Emergency Medicine | Admitting: Emergency Medicine

## 2020-02-04 ENCOUNTER — Other Ambulatory Visit: Payer: Self-pay

## 2020-02-04 ENCOUNTER — Emergency Department (HOSPITAL_COMMUNITY): Payer: Medicare Other

## 2020-02-04 ENCOUNTER — Encounter (HOSPITAL_COMMUNITY): Payer: Self-pay | Admitting: Emergency Medicine

## 2020-02-04 DIAGNOSIS — R0602 Shortness of breath: Secondary | ICD-10-CM | POA: Insufficient documentation

## 2020-02-04 DIAGNOSIS — Z79890 Hormone replacement therapy (postmenopausal): Secondary | ICD-10-CM | POA: Insufficient documentation

## 2020-02-04 DIAGNOSIS — Z79899 Other long term (current) drug therapy: Secondary | ICD-10-CM | POA: Insufficient documentation

## 2020-02-04 DIAGNOSIS — R61 Generalized hyperhidrosis: Secondary | ICD-10-CM | POA: Diagnosis not present

## 2020-02-04 DIAGNOSIS — E669 Obesity, unspecified: Secondary | ICD-10-CM | POA: Diagnosis not present

## 2020-02-04 DIAGNOSIS — I4891 Unspecified atrial fibrillation: Secondary | ICD-10-CM | POA: Insufficient documentation

## 2020-02-04 DIAGNOSIS — E119 Type 2 diabetes mellitus without complications: Secondary | ICD-10-CM | POA: Diagnosis not present

## 2020-02-04 DIAGNOSIS — J811 Chronic pulmonary edema: Secondary | ICD-10-CM | POA: Diagnosis not present

## 2020-02-04 DIAGNOSIS — R42 Dizziness and giddiness: Secondary | ICD-10-CM | POA: Insufficient documentation

## 2020-02-04 DIAGNOSIS — R000 Tachycardia, unspecified: Secondary | ICD-10-CM | POA: Diagnosis not present

## 2020-02-04 DIAGNOSIS — E039 Hypothyroidism, unspecified: Secondary | ICD-10-CM | POA: Insufficient documentation

## 2020-02-04 DIAGNOSIS — I10 Essential (primary) hypertension: Secondary | ICD-10-CM | POA: Diagnosis not present

## 2020-02-04 DIAGNOSIS — I499 Cardiac arrhythmia, unspecified: Secondary | ICD-10-CM | POA: Diagnosis not present

## 2020-02-04 DIAGNOSIS — R0789 Other chest pain: Secondary | ICD-10-CM | POA: Diagnosis not present

## 2020-02-04 DIAGNOSIS — Z7982 Long term (current) use of aspirin: Secondary | ICD-10-CM | POA: Insufficient documentation

## 2020-02-04 DIAGNOSIS — Z743 Need for continuous supervision: Secondary | ICD-10-CM | POA: Diagnosis not present

## 2020-02-04 NOTE — ED Triage Notes (Signed)
Pt called EMS stating she was in A-fib. Per EMS, pt with hx of A-fib so she took 25mg  Cardiziem PTA. HR per EMS was 140-160.

## 2020-02-04 NOTE — Discharge Instructions (Addendum)
Recheck as needed.  Please call Dr. Rosezella [place] office on Tuesday, September seventh to let him know you had to come to the ED for an episode of atrial fibrillation that spontaneously resolved after you took your Cardizem/diltiazem.

## 2020-02-04 NOTE — ED Provider Notes (Signed)
[facility] EMERGENCY DEPARTMENT Provider Note   CSN: [id number] Arrival date & time: 02/04/20  0119   Time seen 1:45 AM  History Chief Complaint  Patient presents with  . Irregular Heart Beat    Emily [name] is a 69 y.o. female.  HPI   Patient reports she has a history of atrial fibrillation.  She is also status post watchman procedure and now takes aspirin 81 mg a day.  She states about 11:15 PM tonight she was in bed and she started getting pain in the back of her neck which is typical for when she is in her atrial fibrillation.  She denies feeling actual palpitations or having chest pain.  She had mild shortness of breath and got a little bit sweaty.  She denies nausea or vomiting.  EMS reports that her heart rate was 140-160.  She states she took Cardizem 25 mg orally about 11:30 PM.  She states currently she feels tired.  She states she has had to be cardioverted a few times and the last time she had an episode of atrial fibrillation was before Christmas.  PCP Janora Norlander, DO Cardiology Dr Percival Spanish  Past Medical History:  Diagnosis Date  . Anemia   . Arthritis   . Atrial fibrillation, rapid ([place]) 10/27/2013  . Bilateral carotid artery disease ([place])   . Diabetes mellitus without complication ([place])   . Difficult intubation    states 'lady that did the sleep study told me I have the smallest airway she has ever seen in an adult"  . Dyslipidemia 06/29/2017  . Dysrhythmia   . Hypertension   . Hypothyroid   . Obesity   . Obstructive sleep apnea    on C Pap  . PAF (paroxysmal atrial fibrillation) ([place])    a. newly dx in 09/2013; on eliquis    Patient Active Problem List   Diagnosis Date Noted  . Persistent atrial fibrillation ([place]) 01/16/2020  . Bilateral carotid artery stenosis 10/12/2019  . Educated about COVID-19 virus infection 10/12/2019  . Restless leg syndrome 02/16/2018  . Chronic left-sided low back pain with left-sided sciatica 02/16/2018  . Morbid  obesity ([place]) 02/14/2018  . Bruit 02/14/2018  . PAF (paroxysmal atrial fibrillation) ([place]) 02/14/2018  . DDD (degenerative disc disease), lumbar 12/07/2017  . Peripheral arterial disease ([place]) 07/06/2017  . Hyperlipidemia associated with type 2 diabetes mellitus ([place]) 06/29/2017  . Iron deficiency anemia 05/27/2017  . Vitamin B12 deficiency 05/27/2017  . Pain in joint, ankle and foot 06/03/2016  . Carotid stenosis 08/30/2015  . Bilateral carotid artery disease ([place]) 07/05/2015  . Acute bronchitis 01/31/2015  . Diabetes mellitus without complication ([place])   . Hypertension associated with diabetes ([place]) 10/27/2013  . Hypothyroidism due to acquired atrophy of thyroid 10/27/2013    Past Surgical History:  Procedure Laterality Date  . CATARACT EXTRACTION Left   . CATARACT EXTRACTION W/PHACO Right 12/09/2015   Procedure: CATARACT EXTRACTION PHACO AND INTRAOCULAR LENS PLACEMENT (IOC);  Surgeon: Tonny Branch, MD;  Location: [facility];  Service: Ophthalmology;  Laterality: Right;  CDE:10.48  . COLONOSCOPY W/ POLYPECTOMY    . cyst on back of neck removed    . DILATION AND CURETTAGE OF UTERUS     x 5  . ENDARTERECTOMY Left 08/30/2015   Procedure: LEFT CAROYID ENDARTERECTOMY WITH XENOSURE BOVINE PERICARDIUM PATCH ANGIOPLASTY;  Surgeon: [name], MD;  Location: [place];  Service: Vascular;  Laterality: Left;  . EYE SURGERY    . TONSILLECTOMY    .  UPPER GI ENDOSCOPY       OB History   No obstetric history on file.     Family History  Problem Relation Age of Onset  . COPD Father   . Heart failure Father   . Heart disease Father   . Emphysema Father   . Arrhythmia Sister   . Arrhythmia Sister   . Arrhythmia Sister        had PPM also  . Cancer Sister     Social History   Tobacco Use  . Smoking status: Never Smoker  . Smokeless tobacco: Never Used  Vaping Use  . Vaping Use: Never used  Substance Use Topics  . Alcohol use: No    Alcohol/week: 0.0 standard drinks  . Drug use:  Never    Home Medications Prior to Admission medications   Medication Sig Start Date End Date Taking? Authorizing Provider  ACCU-CHEK GUIDE test strip USE TO CHECK BLOOD GLUCOSE ONCE DAILY DX E11.9 12/08/19   Ronnie Doss M, DO  aspirin EC 81 MG tablet Take by mouth. 09/15/18   [provider]  blood glucose meter kit and supplies Dispense based on patient and insurance preference. Use up to four times daily as directed. E11.9 06/28/18   Ronnie Doss M, DO  CRANBERRY PO Take 1 tablet by mouth 2 (two) times daily.    [provider]  diltiazem (CARDIZEM CD) 180 MG 24 hr capsule TAKE 1 CAPSULE BY MOUTH EVERY DAY 08/17/19   Minus Breeding, MD  empagliflozin (JARDIANCE) 25 MG TABS tablet Take 25 mg by mouth daily before breakfast. 02/28/19   Ronnie Doss M, DO  ezetimibe (ZETIA) 10 MG tablet TAKE 1 TABLET BY MOUTH EVERY DAY 08/17/19   Minus Breeding, MD  flecainide (TAMBOCOR) 150 MG tablet TAKE 0.5 TABLETS (75 MG TOTAL) BY MOUTH 2 (TWO) TIMES DAILY. 06/07/19   Minus Breeding, MD  furosemide (LASIX) 20 MG tablet Take 1 tablet (20 mg total) by mouth daily as needed for fluid. 03/08/18 12/26/20  Janora Norlander, DO  glipiZIDE (GLUCOTROL) 5 MG tablet Take 1 tablet (5 mg total) by mouth 2 (two) times daily. 11/15/18   Janora Norlander, DO  Lancet Device MISC Use to test sugar once daily E11.9 06/28/18   Ronnie Doss M, DO  levothyroxine (SYNTHROID) 112 MCG tablet TAKE 1 TABLET BY MOUTH EVERY OTHER DAY, ALTERNATING WITH 1 & 1/2 TABLETS EVERY OTHER DAY 03/29/19   Ronnie Doss M, DO  losartan (COZAAR) 25 MG tablet TAKE 1 TABLET BY MOUTH EVERY DAY 09/14/19   Almyra Deforest, PA  lovastatin (MEVACOR) 40 MG tablet TAKE 1 TABLET BY MOUTH EVERY DAY IN THE EVENING 10/09/19   Minus Breeding, MD  metFORMIN (GLUCOPHAGE) 1000 MG tablet TAKE 1 TABLET (1,000 MG TOTAL) BY MOUTH 2 (TWO) TIMES DAILY WITH A MEAL. 01/08/20   Ronnie Doss M, DO  metoprolol succinate (TOPROL-XL) 100 MG 24  hr tablet Take 1 tablet (100 mg total) by mouth daily. Take with or immediately following a meal. 11/15/18   Gottschalk, Leatrice Jewels M, DO  rOPINIRole (REQUIP) 0.5 MG tablet TAKE 1 TABLET (0.5 MG TOTAL) BY MOUTH AT BEDTIME. 11/13/19   Ronnie Doss M, DO  triamcinolone cream (KENALOG) 0.1 % Apply 1 application topically 2 (two) times daily. To neck x7-10d 06/13/19   Ronnie Doss M, DO    Allergies    Quinapril hcl, Statins, and Tape  Review of Systems   Review of Systems  All other systems reviewed and are  negative.   Physical Exam Updated Vital Signs BP 138/73   Pulse (!) 120   Temp 97.6 F (36.4 C) (Oral)   Resp (!) 24   Ht _0  (1.575 m)   Wt 101.6 kg   SpO2 95%   BMI 40.97 kg/m   Physical Exam Vitals and nursing note reviewed.  Constitutional:      Appearance: Normal appearance. She is obese.  HENT:     Head: Normocephalic and atraumatic.  Eyes:     Extraocular Movements: Extraocular movements intact.     Conjunctiva/sclera: Conjunctivae normal.     Pupils: Pupils are equal, round, and reactive to light.  Cardiovascular:     Rate and Rhythm: Tachycardia present. Rhythm irregular.     Pulses: Normal pulses.     Heart sounds: Normal heart sounds. No murmur heard.   Pulmonary:     [place]: Pulmonary effort is normal. No respiratory distress.     Breath sounds: Normal breath sounds. No wheezing, rhonchi or rales.  Musculoskeletal:        General: Normal range of motion.     Cervical back: Normal range of motion.  Skin:    General: Skin is warm and dry.  Neurological:     General: No focal deficit present.     Mental Status: She is alert and oriented to person, place, and time.     Cranial Nerves: No cranial nerve deficit.  Psychiatric:        Mood and Affect: Mood normal.        Behavior: Behavior normal.        Thought Content: Thought content normal.     ED Results / Procedures / Treatments   Labs (all labs ordered are listed, but only abnormal results  are displayed) Labs Reviewed - No data to display  EKG EKG Interpretation  Date/Time:  Sunday February 04 2020 01:31:38 EDT Ventricular Rate:  119 PR Interval:    QRS Duration: 144 QT Interval:  356 QTC Calculation: 501 R Axis:   -73 Text Interpretation: Atrial fibrillation with rapid ventricular response Left bundle branch block Since last tracing rate faster 20 May 2019 Atrial fibrillation has replaced Sinus bradycardia Confirmed by Rolland Porter [phone]) on 02/04/2020 2:15:39 AM  #2  EKG Interpretation  Date/Time:  Sunday February 04 2020 02:13:11 EDT Ventricular Rate:  73 PR Interval:    QRS Duration: 138 QT Interval:  461 QTC Calculation: 508 R Axis:   -60 Text Interpretation: Sinus rhythm Left bundle branch block Since last tracing of earlier today Normal sinus rhythm has replaced Atrial fibrillation Confirmed by Rolland Porter [phone]) on 02/04/2020 2:16:47 AM        Radiology DG Chest Port 1 View  Result Date: 02/04/2020 CLINICAL DATA:  Dizziness patient in AFib EXAM: PORTABLE CHEST 1 VIEW COMPARISON:  None. FINDINGS: The heart size and mediastinal contours are within normal limits. There is prominence of the central pulmonary vasculature. No large airspace consolidation or pleural effusion. IMPRESSION: Mild pulmonary vascular congestion. Electronically Signed   By: Prudencio Pair M.D.   On: 02/04/2020 02:01    Procedures Procedures (including critical care time)  Medications Ordered in ED Medications - No data to display  ED Course  I have reviewed the triage vital signs and the nursing notes.  Pertinent labs & imaging results that were available during my care of the patient were reviewed by me and considered in my medical decision making (see chart for details).    MDM Rules/Calculators/A&P  Patient was in atrial fibrillation with heart rate around 120 when I was looking at the monitor as I placed my stethoscope on her chest and then she was in  sinus rhythm with heart rate of 75 at 1:53 AM.  She states the pain in her neck has also gone away when her heart rate converted to normal sinus rhythm.  I reviewed patient's prior charts, she has not had problems with her potassium or other electrolyte problems.  She did have an anemia however her primary care doctor is treating that and following it and her hemoglobin has improved up to 10 on August 23.  At this point I do not see a reason to do any blood work Midwife.  Her atrial fibrillation is a well-known problem.  She has a cardiologist she can follow-up with.  Patient was discharged home.     Final Clinical Impression(s) / ED Diagnoses Final diagnoses:  Atrial fibrillation with rapid ventricular response ([place])    Rx / DC Orders ED Discharge Orders    None     Plan discharge  Rolland Porter, MD, Barbette Or, MD 02/04/20 Rogene [place]

## 2020-02-06 ENCOUNTER — Telehealth: Payer: Self-pay | Admitting: Cardiology

## 2020-02-06 NOTE — Telephone Encounter (Signed)
Spoke to patient she stated her heart went out of rhythm on 9/4 she called EMS and was taken to [facility] ED.Stated while waiting her heart went back in rhythm.Stated when her heart goes out of rhythm she has pain in back of neck and across shoulder blades.Stated she feels fine now.She wanted to know if she needs to see Dr.Hochrein sooner than 1 year.Advised I will send message to him for advice.

## 2020-02-06 NOTE — Telephone Encounter (Signed)
  Patient had to go to the ED on 02/04/20 due to being in Afib. She is better now but she was told by the ED doctor to contact Dr Percival Spanish and let him know what happened and see if he needs to see her or has any recommendations of care. Please advise.

## 2020-02-07 NOTE — Telephone Encounter (Signed)
Spoke with patient and scheduled her to see Dr Percival Spanish in [place] on 10/20.  Pt prefers to be seen on Tuesdays as that is her day off.  She will call the [place] office to see if he has any upcoming available appts there on a Tuesday.

## 2020-02-07 NOTE — Telephone Encounter (Signed)
I could see her up in [place] in one month.

## 2020-02-20 ENCOUNTER — Other Ambulatory Visit: Payer: Self-pay | Admitting: Cardiology

## 2020-02-21 ENCOUNTER — Telehealth: Payer: Self-pay | Admitting: Family Medicine

## 2020-02-21 NOTE — Telephone Encounter (Signed)
Pt aware.

## 2020-02-21 NOTE — Telephone Encounter (Signed)
If worsening or any other concerning symptoms, recommend COVID testing.  Give it a few days.  Can place an order here if needed going forward.

## 2020-02-21 NOTE — Telephone Encounter (Signed)
°  Incoming Patient Call  02/21/2020  What symptoms do you have? Runny nose   How long have you been sick? Started this morning but is more than usual   Have you been seen for this problem? No, she just needs advice on what to do she is a little paranoid she can taste and smell feels fine

## 2020-02-28 ENCOUNTER — Other Ambulatory Visit: Payer: Self-pay | Admitting: Cardiology

## 2020-03-04 ENCOUNTER — Other Ambulatory Visit (HOSPITAL_COMMUNITY): Payer: Self-pay

## 2020-03-04 DIAGNOSIS — D509 Iron deficiency anemia, unspecified: Secondary | ICD-10-CM

## 2020-03-05 ENCOUNTER — Other Ambulatory Visit: Payer: Self-pay

## 2020-03-05 ENCOUNTER — Ambulatory Visit (INDEPENDENT_AMBULATORY_CARE_PROVIDER_SITE_OTHER): Payer: Medicare Other | Admitting: *Deleted

## 2020-03-05 ENCOUNTER — Inpatient Hospital Stay (HOSPITAL_COMMUNITY): Payer: Medicare Other | Attending: Hematology

## 2020-03-05 DIAGNOSIS — Z23 Encounter for immunization: Secondary | ICD-10-CM | POA: Diagnosis not present

## 2020-03-05 DIAGNOSIS — D509 Iron deficiency anemia, unspecified: Secondary | ICD-10-CM | POA: Diagnosis not present

## 2020-03-05 DIAGNOSIS — Z809 Family history of malignant neoplasm, unspecified: Secondary | ICD-10-CM | POA: Insufficient documentation

## 2020-03-05 DIAGNOSIS — Z86010 Personal history of colonic polyps: Secondary | ICD-10-CM | POA: Insufficient documentation

## 2020-03-05 DIAGNOSIS — E538 Deficiency of other specified B group vitamins: Secondary | ICD-10-CM

## 2020-03-05 LAB — COMPREHENSIVE METABOLIC PANEL
ALT: 18 U/L (ref 0–44)
AST: 21 U/L (ref 15–41)
Albumin: 3.8 g/dL (ref 3.5–5.0)
Alkaline Phosphatase: 81 U/L (ref 38–126)
Anion gap: 9 (ref 5–15)
BUN: 26 mg/dL — ABNORMAL HIGH (ref 8–23)
CO2: 25 mmol/L (ref 22–32)
Calcium: 8.9 mg/dL (ref 8.9–10.3)
Chloride: 104 mmol/L (ref 98–111)
Creatinine, Ser: 0.66 mg/dL (ref 0.44–1.00)
GFR calc non Af Amer: >60 mL/min (ref >60)
Glucose, Bld: 181 mg/dL — ABNORMAL HIGH (ref 70–99)
Potassium: 4.2 mmol/L (ref 3.5–5.1)
Sodium: 138 mmol/L (ref 135–145)
Total Bilirubin: 0.8 mg/dL (ref 0.3–1.2)
Total Protein: 6.7 g/dL (ref 6.5–8.1)

## 2020-03-05 LAB — CBC WITH DIFFERENTIAL/PLATELET
Abs Immature Granulocytes: 0.06 10*3/uL (ref 0.00–0.07)
Basophils Absolute: 0 10*3/uL (ref 0.0–0.1)
Basophils Relative: 1 %
Eosinophils Absolute: 0.3 10*3/uL (ref 0.0–0.5)
Eosinophils Relative: 4 %
HCT: 36.7 % (ref 36.0–46.0)
Hemoglobin: 11.7 g/dL — ABNORMAL LOW (ref 12.0–15.0)
Immature Granulocytes: 1 %
Lymphocytes Relative: 10 %
Lymphs Abs: 0.8 10*3/uL (ref 0.7–4.0)
MCH: 28.4 pg (ref 26.0–34.0)
MCHC: 31.9 g/dL (ref 30.0–36.0)
MCV: 89.1 fL (ref 80.0–100.0)
Monocytes Absolute: 0.5 10*3/uL (ref 0.1–1.0)
Monocytes Relative: 7 %
Neutro Abs: 5.9 10*3/uL (ref 1.7–7.7)
Neutrophils Relative %: 77 %
Platelets: 220 10*3/uL (ref 150–400)
RBC: 4.12 MIL/uL (ref 3.87–5.11)
RDW: 21.3 % — ABNORMAL HIGH (ref 11.5–15.5)
WBC: 7.6 10*3/uL (ref 4.0–10.5)
nRBC: 0 % (ref 0.0–0.2)

## 2020-03-05 NOTE — Progress Notes (Signed)
Patient here today for monthly B12 injection. 1000 mcg given IM in right deltoid. Patient tolerated well.

## 2020-03-12 ENCOUNTER — Inpatient Hospital Stay (HOSPITAL_BASED_OUTPATIENT_CLINIC_OR_DEPARTMENT_OTHER): Payer: Medicare Other | Admitting: Hematology

## 2020-03-12 VITALS — BP 161/50 | HR 58 | Temp 97.2°F | Resp 20 | Wt 227.6 lb

## 2020-03-12 DIAGNOSIS — D509 Iron deficiency anemia, unspecified: Secondary | ICD-10-CM

## 2020-03-12 DIAGNOSIS — Z809 Family history of malignant neoplasm, unspecified: Secondary | ICD-10-CM | POA: Diagnosis not present

## 2020-03-12 DIAGNOSIS — Z86010 Personal history of colonic polyps: Secondary | ICD-10-CM | POA: Diagnosis not present

## 2020-03-12 NOTE — Progress Notes (Signed)
[place], [place] [address]   CLINIC:  Medical Oncology/Hematology  PCP:  Janora Norlander, DO [address] [place] [address]  [phone]  REASON FOR VISIT:  Follow-up for IDA  PRIOR THERAPY: Intermittent PRBC  CURRENT THERAPY: Intermittent Feraheme  INTERVAL HISTORY:  Emily [name], a 69 y.o. female, returns for routine follow-up for her IDA. Mio was last seen on 01/16/2020.  Today she reports feeling well and her energy levels improved after her last iron infusion on 8/31. She denies having melena, hematochezia or hematuria. She used to take iron tablets but stopped in April 2021 after being constipated. The color of her stool is normal now and she is able to tell when she has bleeding, since her stools turn black.  She is tolerating work and is able to do all of her ADL's.   REVIEW OF SYSTEMS:  Review of Systems  Constitutional: Positive for fatigue (75%). Negative for appetite change.  HENT:   Positive for trouble swallowing.   Gastrointestinal: Negative for blood in stool.  Genitourinary: Positive for difficulty urinating. Negative for hematuria.   All other systems reviewed and are negative.   PAST MEDICAL/SURGICAL HISTORY:  Past Medical History:  Diagnosis Date  . Anemia   . Arthritis   . Atrial fibrillation, rapid ([place]) 10/27/2013  . Bilateral carotid artery disease ([place])   . Diabetes mellitus without complication ([place])   . Difficult intubation    states 'lady that did the sleep study told me I have the smallest airway she has ever seen in an adult"  . Dyslipidemia 06/29/2017  . Dysrhythmia   . Hypertension   . Hypothyroid   . Obesity   . Obstructive sleep apnea    on C Pap  . PAF (paroxysmal atrial fibrillation) ([place])    a. newly dx in 09/2013; on eliquis   Past Surgical History:  Procedure Laterality Date  . CATARACT EXTRACTION Left   . CATARACT EXTRACTION W/PHACO Right 12/09/2015   Procedure:  CATARACT EXTRACTION PHACO AND INTRAOCULAR LENS PLACEMENT (IOC);  Surgeon: Tonny Branch, MD;  Location: [facility];  Service: Ophthalmology;  Laterality: Right;  CDE:10.48  . COLONOSCOPY W/ POLYPECTOMY    . cyst on back of neck removed    . DILATION AND CURETTAGE OF UTERUS     x 5  . ENDARTERECTOMY Left 08/30/2015   Procedure: LEFT CAROYID ENDARTERECTOMY WITH XENOSURE BOVINE PERICARDIUM PATCH ANGIOPLASTY;  Surgeon: [name], MD;  Location: [facility];  Service: Vascular;  Laterality: Left;  . EYE SURGERY    . TONSILLECTOMY    . UPPER GI ENDOSCOPY      SOCIAL HISTORY:  Social History   Socioeconomic History  . Marital status: Single    Spouse name: Not on file  . Number of children: Not on file  . Years of education: Not on file  . Highest education level: Not on file  Occupational History  . Not on file  Tobacco Use  . Smoking status: Never Smoker  . Smokeless tobacco: Never Used  Vaping Use  . Vaping Use: Never used  Substance and Sexual Activity  . Alcohol use: No    Alcohol/week: 0.0 standard drinks  . Drug use: Never  . Sexual activity: Never  Other Topics Concern  . Not on file  Social History Narrative  . Not on file   Social Determinants of Health   Financial Resource Strain:   . Difficulty of Paying  Living Expenses: Not on file  Food Insecurity:   . Worried About Charity fundraiser in the Last Year: Not on file  . Ran Out of Food in the Last Year: Not on file  Transportation Needs:   . Lack of Transportation (Medical): Not on file  . Lack of Transportation (Non-Medical): Not on file  Physical Activity:   . Days of Exercise per Week: Not on file  . Minutes of Exercise per Session: Not on file  Stress:   . Feeling of Stress : Not on file  Social Connections:   . Frequency of Communication with Friends and Family: Not on file  . Frequency of Social Gatherings with Friends and Family: Not on file  . Attends Religious Services: Not on file  . Active Member of  Clubs or Organizations: Not on file  . Attends Archivist Meetings: Not on file  . Marital Status: Not on file  Intimate Partner Violence:   . Fear of Current or Ex-Partner: Not on file  . Emotionally Abused: Not on file  . Physically Abused: Not on file  . Sexually Abused: Not on file    FAMILY HISTORY:  Family History  Problem Relation Age of Onset  . COPD Father   . Heart failure Father   . Heart disease Father   . Emphysema Father   . Arrhythmia Sister   . Arrhythmia Sister   . Arrhythmia Sister        had PPM also  . Cancer Sister     CURRENT MEDICATIONS:  Current Outpatient Medications  Medication Sig Dispense Refill  . ACCU-CHEK GUIDE test strip USE TO CHECK BLOOD GLUCOSE ONCE DAILY DX E11.9 100 strip 3  . aspirin EC 81 MG tablet Take by mouth.    . blood glucose meter kit and supplies Dispense based on patient and insurance preference. Use up to four times daily as directed. E11.9 1 each 0  . CRANBERRY PO Take 1 tablet by mouth 2 (two) times daily.    Marland Kitchen diltiazem (CARDIZEM CD) 180 MG 24 hr capsule TAKE 1 CAPSULE BY MOUTH EVERY DAY 90 capsule 1  . empagliflozin (JARDIANCE) 25 MG TABS tablet Take 25 mg by mouth daily before breakfast. 30 tablet 11  . ezetimibe (ZETIA) 10 MG tablet TAKE 1 TABLET BY MOUTH EVERY DAY 90 tablet 3  . flecainide (TAMBOCOR) 150 MG tablet TAKE 0.5 TABLETS (75 MG TOTAL) BY MOUTH 2 (TWO) TIMES DAILY. 90 tablet 3  . furosemide (LASIX) 20 MG tablet Take 1 tablet (20 mg total) by mouth daily as needed for fluid. 90 tablet 0  . glipiZIDE (GLUCOTROL) 5 MG tablet TAKE 1 TABLET BY MOUTH TWICE A DAY 180 tablet 0  . Lancet Device MISC Use to test sugar once daily E11.9 100 each 12  . levothyroxine (SYNTHROID) 112 MCG tablet TAKE 1 TABLET BY MOUTH EVERY OTHER DAY, ALTERNATING WITH 1 & 1/2 TABLETS EVERY OTHER DAY 112 tablet 3  . losartan (COZAAR) 25 MG tablet TAKE 1 TABLET BY MOUTH EVERY DAY 90 tablet 1  . lovastatin (MEVACOR) 40 MG tablet TAKE 1  TABLET BY MOUTH EVERY DAY IN THE EVENING 90 tablet 3  . metFORMIN (GLUCOPHAGE) 1000 MG tablet TAKE 1 TABLET (1,000 MG TOTAL) BY MOUTH 2 (TWO) TIMES DAILY WITH A MEAL. 180 tablet 0  . metoprolol succinate (TOPROL-XL) 100 MG 24 hr tablet TAKE 1 TABLET (100 MG TOTAL) BY MOUTH DAILY. TAKE WITH OR IMMEDIATELY FOLLOWING A MEAL. 90 tablet  0  . rOPINIRole (REQUIP) 0.5 MG tablet TAKE 1 TABLET (0.5 MG TOTAL) BY MOUTH AT BEDTIME. 90 tablet 0  . triamcinolone cream (KENALOG) 0.1 % Apply 1 application topically 2 (two) times daily. To neck x7-10d 30 g 0   Current Facility-Administered Medications  Medication Dose Route Frequency Provider Last Rate Last Admin  . cyanocobalamin ((VITAMIN B-12)) injection 1,000 mcg  1,000 mcg Intramuscular Q30 days Timmothy Euler, MD   1,000 mcg at 03/05/20 1914    ALLERGIES:  Allergies  Allergen Reactions  . Quinapril Hcl Cough  . Statins Other (See Comments)    Not all Statins but some cause cough and pain in legs.  . Tape Other (See Comments)    Redness, please use "paper" tape    PHYSICAL EXAM:  Performance status (ECOG): 0 - Asymptomatic  Vitals:   03/12/20 1402  BP: (!) 161/50  Pulse: (!) 58  Resp: 20  Temp: (!) 97.2 F (36.2 C)  SpO2: 98%   Wt Readings from Last 3 Encounters:  03/12/20 227 lb 9.6 oz (103.2 kg)  02/04/20 224 lb (101.6 kg)  01/17/20 225 lb (102.1 kg)   Physical Exam Vitals reviewed.  Constitutional:      Appearance: Normal appearance. She is obese.  Cardiovascular:     Rate and Rhythm: Normal rate and regular rhythm.     Pulses: Normal pulses.     Heart sounds: Normal heart sounds.  Pulmonary:     Effort: Pulmonary effort is normal.     Breath sounds: Normal breath sounds.  Abdominal:     Palpations: Abdomen is soft. There is no hepatomegaly, splenomegaly or mass.     Tenderness: There is no abdominal tenderness.     Hernia: No hernia is present.  Musculoskeletal:     Right lower leg: No edema.     Left lower leg: No  edema.  Neurological:     General: No focal deficit present.     Mental Status: She is alert and oriented to person, place, and time.  Psychiatric:        Mood and Affect: Mood normal.        Behavior: Behavior normal.     LABORATORY DATA:  I have reviewed the labs as listed.  CBC Latest Ref Rng & Units 03/05/2020 01/16/2020 01/11/2020  WBC 4.0 - 10.5 K/uL 7.6 8.2 -  Hemoglobin 12.0 - 15.0 g/dL 11.7(L) 8.9(L) -  Hematocrit 36 - 46 % 36.7 32.3(L) 30.5(L)  Platelets 150 - 400 K/uL 220 289 -   CMP Latest Ref Rng & Units 03/05/2020 01/16/2020 10/03/2019  Glucose 70 - 99 mg/dL 181(H) 234(H) 271(H)  BUN 8 - 23 mg/dL 26(H) 25(H) 28(H)  Creatinine 0.44 - 1.00 mg/dL 0.66 0.74 0.79  Sodium 135 - 145 mmol/L 138 135 135  Potassium 3.5 - 5.1 mmol/L 4.2 4.6 3.8  Chloride 98 - 111 mmol/L 104 101 95(L)  CO2 22 - 32 mmol/L 25 23 24   Calcium 8.9 - 10.3 mg/dL 8.9 9.1 9.0  Total Protein 6.5 - 8.1 g/dL 6.7 7.3 -  Total Bilirubin 0.3 - 1.2 mg/dL 0.8 0.7 -  Alkaline Phos 38 - 126 U/L 81 85 -  AST 15 - 41 U/L 21 18 -  ALT 0 - 44 U/L 18 15 -      Component Value Date/Time   RBC 4.12 03/05/2020 1046   MCV 89.1 03/05/2020 1046   MCV 86 05/15/2019 1430   MCH 28.4 03/05/2020 1046   MCHC  31.9 03/05/2020 1046   RDW 21.3 (H) 03/05/2020 1046   RDW 15.0 05/15/2019 1430   LYMPHSABS 0.8 03/05/2020 1046   LYMPHSABS 1.4 07/27/2017 1418   MONOABS 0.5 03/05/2020 1046   EOSABS 0.3 03/05/2020 1046   EOSABS 0.2 07/27/2017 1418   BASOSABS 0.0 03/05/2020 1046   BASOSABS 0.0 07/27/2017 1418    DIAGNOSTIC IMAGING:  I have independently reviewed the scans and discussed with the patient. No results found.   ASSESSMENT:  1.  Iron deficiency anemia: -Labs on 01/11/2020 shows ferritin of 8, iron saturation of 6 with normal F18 and folic acid levels.  Hematocrit was 30.5.  Prior hemoglobin was 8.6 on 01/10/2020. -Colonoscopy on 08/30/2019 at [facility] showed medium sized lipoma in the transverse colon with no other  lesions. -Patient reportedly hospitalized at [facility] in mid April and received 6 units of PRBC. -Double-balloon enteroscopy on 10/27/2019 shows normal esophagus, stomach, duodenum and jejunum. -She cannot tolerate iron pills due to severe constipation.  She last received Injectafer on 01/05/2018 and 01/12/2018. -Likely etiology is blood loss from GI tract. -Received Feraheme on 01/23/2020 and 01/30/2020.   PLAN:  1.  Iron deficiency anemia: -We reviewed results of blood work from 01/16/2020.  SPEP is negative.  Other nutritional deficiency work-up was negative.  Ferritin was low at 8. -She received Feraheme infusions end of August.  She is feeling much better in terms of energy levels. -We reviewed labs from 03/05/2020.  Hemoglobin improved 11.7.  MCV is 89. -Recommend follow-up in 3 months with repeat labs.  She was told to come back sooner should she develop any severe weakness or bleeding.  Orders placed this encounter:  Orders Placed This Encounter  Procedures  . CBC with Differential/Platelet  . Ferritin  . Iron and TIBC     Derek Jack, MD [place] [phone]   I, Milinda Antis, am acting as a scribe for Dr. Sanda Linger.  I, Derek Jack MD, have reviewed the above documentation for accuracy and completeness, and I agree with the above.

## 2020-03-12 NOTE — Patient Instructions (Signed)
[place] at [facility] Discharge Instructions  You were seen today by Dr. Delton Coombes. He went over your recent results; your blood work was normal and negative for leukemia. Dr. Delton Coombes will see you back in 3 months for labs and follow up.   Thank you for choosing [place] at [facility] to provide your oncology and hematology care.  To afford each patient quality time with our provider, please arrive at least 15 minutes before your scheduled appointment time.   If you have a lab appointment with the [facility] please come in thru the Main Entrance and check in at the main information desk  You need to re-schedule your appointment should you arrive 10 or more minutes late.  We strive to give you quality time with our providers, and arriving late affects you and other patients whose appointments are after yours.  Also, if you no show three or more times for appointments you may be dismissed from the clinic at the providers discretion.     Again, thank you for choosing [facility].  Our hope is that these requests will decrease the amount of time that you wait before being seen by our physicians.       _____________________________________________________________  Should you have questions after your visit to [facility], please contact our office at [phone] between the hours of 8:00 a.m. and 4:30 p.m.  Voicemails left after 4:00 p.m. will not be returned until the following business day.  For prescription refill requests, have your pharmacy contact our office and allow 72 hours.    Cancer Center Support Programs:   > Cancer Support Group  2nd Tuesday of the month 1pm-2pm, Journey Room

## 2020-03-13 ENCOUNTER — Ambulatory Visit (INDEPENDENT_AMBULATORY_CARE_PROVIDER_SITE_OTHER): Payer: Medicare Other

## 2020-03-13 ENCOUNTER — Other Ambulatory Visit: Payer: Self-pay

## 2020-03-13 DIAGNOSIS — Z23 Encounter for immunization: Secondary | ICD-10-CM

## 2020-03-13 NOTE — Progress Notes (Signed)
   Covid-19 Vaccination Clinic  Name:  Emily [name]    MRN: [id number] DOB: 01/11/1951  03/13/2020  [name] was observed post Covid-19 immunization for 15 minutes without incident. She was provided with Vaccine Information Sheet and instruction to access the V-Safe system.   [name] was instructed to call 911 with any severe reactions post vaccine: Marland Kitchen Difficulty breathing  . Swelling of face and throat  . A fast heartbeat  . A bad rash all over body  . Dizziness and weakness

## 2020-03-19 DIAGNOSIS — I350 Nonrheumatic aortic (valve) stenosis: Secondary | ICD-10-CM | POA: Insufficient documentation

## 2020-03-19 NOTE — Progress Notes (Signed)
Cardiology Office Note   Date:  03/20/2020   ID:  Emily [name], DOB November 11, 1950, MRN [id number]  PCP:  Janora Norlander, DO  Cardiologist:   Minus Breeding, MD   Chief Complaint  Patient presents with  . Atrial Fibrillation      History of Present Illness: Emily [name] is a 69 y.o. female who presents for followup of atrial fib.   The patient has paroxysmal atrial fibrillation and was treated with flecainide. She has had recurrent GI bleeding and could not tolerate anticoagulation.  She was getting follow-up of her Watchman device at [facility].   In December she had PAF and called EMS.  She had GI bleeding again and she was in the hospital in April for this.  She has had a complete work-up and not been able to identify a source.  She has had transfusions.  She subsequently had double-balloon enteroscopy.   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  She did have TEE in February to follow-up for Watchman and I reviewed this as well.  Most recently she had a transthoracic echo with and her ejection fraction is well-preserved.  She had moderate aortic valve stenosis.  There is some mitral regurgitation but it was trivial on transthoracic but more and slightly advanced transesophageal.  She presents for follow up.  She was in the emergency room in early September with paroxysmal atrial fibrillation. I reviewed these records for this visit.    She said this is the first episode since December of last year.  She otherwise has been feeling okay.  She did have to come off her hydrochlorothiazide because her blood pressure was running low.  As her hemoglobin is slowly coming up and she has not had any further GI bleeding her blood pressure starting to come up a little bit.  She denies any new cardiovascular symptoms such as chest pressure, neck or arm discomfort.  She has had no new shortness of breath, PND or orthopnea.  She has had no weight gain or  edema.   Past Medical History:  Diagnosis Date  . Anemia   . Arthritis   . Atrial fibrillation, rapid ([place]) 10/27/2013  . Bilateral carotid artery disease ([place])   . Diabetes mellitus without complication ([place])   . Difficult intubation    states 'lady that did the sleep study told me I have the smallest airway she has ever seen in an adult"  . Dyslipidemia 06/29/2017  . Dysrhythmia   . Hypertension   . Hypothyroid   . Obesity   . Obstructive sleep apnea    on C Pap  . PAF (paroxysmal atrial fibrillation) ([place])    a. newly dx in 09/2013; on eliquis    Past Surgical History:  Procedure Laterality Date  . CATARACT EXTRACTION Left   . CATARACT EXTRACTION W/PHACO Right 12/09/2015   Procedure: CATARACT EXTRACTION PHACO AND INTRAOCULAR LENS PLACEMENT (IOC);  Surgeon: Tonny Branch, MD;  Location: [facility];  Service: Ophthalmology;  Laterality: Right;  CDE:10.48  . COLONOSCOPY W/ POLYPECTOMY    . cyst on back of neck removed    . DILATION AND CURETTAGE OF UTERUS     x 5  . ENDARTERECTOMY Left 08/30/2015   Procedure: LEFT CAROYID ENDARTERECTOMY WITH XENOSURE BOVINE PERICARDIUM PATCH ANGIOPLASTY;  Surgeon: [name], MD;  Location: [facility];  Service: Vascular;  Laterality: Left;  . EYE SURGERY    . TONSILLECTOMY    . UPPER  GI ENDOSCOPY       Current Outpatient Medications  Medication Sig Dispense Refill  . aspirin EC 81 MG tablet Take by mouth.    . diltiazem (CARDIZEM CD) 180 MG 24 hr capsule TAKE 1 CAPSULE BY MOUTH EVERY DAY 90 capsule 1  . empagliflozin (JARDIANCE) 25 MG TABS tablet Take 25 mg by mouth daily before breakfast. 30 tablet 11  . ezetimibe (ZETIA) 10 MG tablet TAKE 1 TABLET BY MOUTH EVERY DAY 90 tablet 3  . flecainide (TAMBOCOR) 150 MG tablet TAKE 0.5 TABLETS (75 MG TOTAL) BY MOUTH 2 (TWO) TIMES DAILY. 90 tablet 3  . furosemide (LASIX) 20 MG tablet Take 1 tablet (20 mg total) by mouth daily as needed for fluid. 90 tablet 0  . glipiZIDE (GLUCOTROL) 5 MG tablet TAKE 1  TABLET BY MOUTH TWICE A DAY 180 tablet 0  . levothyroxine (SYNTHROID) 112 MCG tablet TAKE 1 TABLET BY MOUTH EVERY OTHER DAY, ALTERNATING WITH 1 & 1/2 TABLETS EVERY OTHER DAY 112 tablet 3  . losartan (COZAAR) 25 MG tablet TAKE 1 TABLET BY MOUTH EVERY DAY 90 tablet 1  . lovastatin (MEVACOR) 40 MG tablet TAKE 1 TABLET BY MOUTH EVERY DAY IN THE EVENING 90 tablet 3  . metFORMIN (GLUCOPHAGE) 1000 MG tablet TAKE 1 TABLET (1,000 MG TOTAL) BY MOUTH 2 (TWO) TIMES DAILY WITH A MEAL. 180 tablet 0  . metoprolol succinate (TOPROL-XL) 100 MG 24 hr tablet TAKE 1 TABLET (100 MG TOTAL) BY MOUTH DAILY. TAKE WITH OR IMMEDIATELY FOLLOWING A MEAL. 90 tablet 0  . rOPINIRole (REQUIP) 0.5 MG tablet TAKE 1 TABLET (0.5 MG TOTAL) BY MOUTH AT BEDTIME. 90 tablet 0  . triamcinolone cream (KENALOG) 0.1 % Apply 1 application topically 2 (two) times daily. To neck x7-10d 30 g 0  . ACCU-CHEK GUIDE test strip USE TO CHECK BLOOD GLUCOSE ONCE DAILY DX E11.9 100 strip 3  . blood glucose meter kit and supplies Dispense based on patient and insurance preference. Use up to four times daily as directed. E11.9 1 each 0  . Lancet Device MISC Use to test sugar once daily E11.9 100 each 12   Current Facility-Administered Medications  Medication Dose Route Frequency Provider Last Rate Last Admin  . cyanocobalamin ((VITAMIN B-12)) injection 1,000 mcg  1,000 mcg Intramuscular Q30 days Timmothy Euler, MD   1,000 mcg at 03/05/20 5035    Allergies:   Quinapril hcl, Statins, and Tape   ROS:  Please see the history of present illness.   Otherwise, review of systems are positive for none.   All other systems are reviewed and negative.    PHYSICAL EXAM: VS:  BP 136/70   Pulse (!) 52   Ht 5' 2.5" (1.588 m)   Wt 222 lb (100.7 kg)   BMI 39.96 kg/m  , BMI Body mass index is 39.96 kg/m. GENERAL:  Well appearing NECK:  No jugular venous distention, waveform within normal limits, carotid upstroke brisk and symmetric, no bruits, no  thyromegaly LUNGS:  Clear to auscultation bilaterally CHEST:  Unremarkable HEART:  PMI not displaced or sustained,S1 and S2 within normal limits, no S3, no S4, no clicks, no rubs, 2 out of 6 apical cyst murmur radiating up the aortic outflow tract and early peaking, no diastolic murmurs ABD:  Flat, positive bowel sounds normal in frequency in pitch, no bruits, no rebound, no guarding, no midline pulsatile mass, no hepatomegaly, no splenomegaly EXT:  2 plus pulses throughout, no edema, no cyanosis no clubbing  EKG:  EKG is  not ordered today.   Recent Labs: 11/20/2019: TSH 4.110 03/05/2020: ALT 18; BUN 26; Creatinine, Ser 0.66; Hemoglobin 11.7; Platelets 220; Potassium 4.2; Sodium 138    Lipid Panel    Component Value Date/Time   CHOL 145 09/08/2019 1015   TRIG 156 (H) 09/08/2019 1015   HDL 37 (L) 09/08/2019 1015   CHOLHDL 3.9 09/08/2019 1015   LDLCALC 81 09/08/2019 1015      Wt Readings from Last 3 Encounters:  03/20/20 222 lb (100.7 kg)  03/12/20 227 lb 9.6 oz (103.2 kg)  02/04/20 224 lb (101.6 kg)      Other studies Reviewed: Additional studies/ records that were reviewed today include: ED records Review of the above records demonstrates:  Please see elsewhere in the note.     ASSESSMENT AND PLAN:  ATRIAL FIB:   .  Ms. Lalana Looman has a CHA2DS2 - VASc score of 4.    She and I had a long discussion about this.  At this point we do not think this dose of flecainide is failing.  However, if she has more frequent paroxysms I would probably increase the dose or switch to Tikosyn otherwise no change in therapy.   GI BLEEDING:  She has had no further GI bleeding.  No further work-up.  HTN:   The blood pressure is at target.  No change in therapy.    CAROTID STENOSIS:  She had 60 - 79% right stenosis and left 40 - 59% stenosis in March of this year.   She is to have follow up with [facility].  She has follow-up scheduled soon.   OBESITY:  We had talked about this at length  in the past.   DM:  A1c was 6.8.  No change in therapy.   AS/LVH:   She had moderate AS she had normal LVEF.  I will follow this clinically and with a repeat echo next year.    Current medicines are reviewed at length with the patient today.  The patient does not have concerns regarding medicines.  The following changes have been made:  None  Labs/ tests ordered today include: None  No orders of the defined types were placed in this encounter.    Disposition:   FU with me in 1 year.     Signed, Minus Breeding, MD  03/20/2020 3:35 PM    [facility]

## 2020-03-20 ENCOUNTER — Encounter: Payer: Self-pay | Admitting: Cardiology

## 2020-03-20 ENCOUNTER — Other Ambulatory Visit: Payer: Self-pay

## 2020-03-20 ENCOUNTER — Ambulatory Visit: Payer: Medicare Other | Admitting: Cardiology

## 2020-03-20 VITALS — BP 136/70 | HR 52 | Ht 62.5 in | Wt 222.0 lb

## 2020-03-20 DIAGNOSIS — I480 Paroxysmal atrial fibrillation: Secondary | ICD-10-CM

## 2020-03-20 DIAGNOSIS — I350 Nonrheumatic aortic (valve) stenosis: Secondary | ICD-10-CM

## 2020-03-20 DIAGNOSIS — I10 Essential (primary) hypertension: Secondary | ICD-10-CM

## 2020-03-20 DIAGNOSIS — E118 Type 2 diabetes mellitus with unspecified complications: Secondary | ICD-10-CM

## 2020-03-20 DIAGNOSIS — I6523 Occlusion and stenosis of bilateral carotid arteries: Secondary | ICD-10-CM | POA: Diagnosis not present

## 2020-03-20 NOTE — Patient Instructions (Signed)
Medication Instructions:  The current medical regimen is effective;  continue present plan and medications.  *If you need a refill on your cardiac medications before your next appointment, please call your pharmacy*  Follow-Up: At [facility], you and your health needs are our priority.  As part of our continuing mission to provide you with exceptional heart care, we have created designated Provider Care Teams.  These Care Teams include your primary Cardiologist (physician) and Advanced Practice Providers (APPs -  Physician Assistants and Nurse Practitioners) who all work together to provide you with the care you need, when you need it.  We recommend signing up for the patient portal called "MyChart".  Sign up information is provided on this After Visit Summary.  MyChart is used to connect with patients for Virtual Visits (Telemedicine).  Patients are able to view lab/test results, encounter notes, upcoming appointments, etc.  Non-urgent messages can be sent to your provider as well.   To learn more about what you can do with MyChart, go to [email or web address].    Your next appointment:   12 month(s)  The format for your next appointment:   In Person  Provider:   James Hochrein, MD   Thank you for choosing [facility]!!     

## 2020-03-21 ENCOUNTER — Telehealth: Payer: Self-pay

## 2020-03-21 NOTE — Telephone Encounter (Signed)
Patient stated she missed a call from Emily [name] around 4 yesterday, was in chart looking for call.

## 2020-03-24 ENCOUNTER — Other Ambulatory Visit: Payer: Self-pay | Admitting: Family Medicine

## 2020-03-24 DIAGNOSIS — E119 Type 2 diabetes mellitus without complications: Secondary | ICD-10-CM

## 2020-03-25 NOTE — Telephone Encounter (Signed)
Next OV 06/18/20

## 2020-04-01 ENCOUNTER — Other Ambulatory Visit: Payer: Self-pay | Admitting: Family Medicine

## 2020-04-02 ENCOUNTER — Ambulatory Visit: Payer: Medicare Other | Admitting: Physician Assistant

## 2020-04-02 ENCOUNTER — Ambulatory Visit (HOSPITAL_COMMUNITY)
Admission: RE | Admit: 2020-04-02 | Discharge: 2020-04-02 | Disposition: A | Discharge status: Home / Self Care | Payer: Medicare Other | Source: Ambulatory Visit | Attending: Physician Assistant | Admitting: Physician Assistant

## 2020-04-02 ENCOUNTER — Other Ambulatory Visit: Payer: Self-pay

## 2020-04-02 VITALS — BP 144/57 | HR 49 | Temp 98.2°F | Resp 20 | Ht 62.5 in | Wt 222.9 lb

## 2020-04-02 DIAGNOSIS — I6523 Occlusion and stenosis of bilateral carotid arteries: Secondary | ICD-10-CM | POA: Diagnosis not present

## 2020-04-02 DIAGNOSIS — Z9889 Other specified postprocedural states: Secondary | ICD-10-CM

## 2020-04-02 NOTE — Progress Notes (Signed)
Office Note     CC:  follow up Requesting Provider:  Janora Norlander, DO  HPI: Emily [name] is a 69 y.o. (1950/07/28) female who presents for follow up of carotid stenosis. She is s/p Left CEA by Dr. Trula Slade on 07/31/15 for asymptomatic left ICA stenosis. She denies any amaurosis fugax, speech difficulties, facial drooping, weakness, numbness of upper or lower extremities. She has had several episodes of waking up at night and having a slight headache. She said this is unusual for her as she does not get headaches. Usually she will fall back to sleep and upon waking the headache is resolved. She has no associated symptoms  Her PMH, SH, FH, and surgical history were reviewed and she reports no new history since her last visit  The pt is on a statin and Zetia for cholesterol management.  The pt is on a daily aspirin.   Other AC: None The pt is on BB, CCB, ARB for hypertension.   The pt is diabetic.   Tobacco hx:  never  Past Medical History:  Diagnosis Date  . Anemia   . Arthritis   . Atrial fibrillation, rapid ([place]) 10/27/2013  . Bilateral carotid artery disease ([place])   . Diabetes mellitus without complication ([place])   . Difficult intubation    states 'lady that did the sleep study told me I have the smallest airway she has ever seen in an adult"  . Dyslipidemia 06/29/2017  . Dysrhythmia   . Hypertension   . Hypothyroid   . Obesity   . Obstructive sleep apnea    on C Pap  . PAF (paroxysmal atrial fibrillation) ([place])    a. newly dx in 09/2013; on eliquis    Past Surgical History:  Procedure Laterality Date  . CATARACT EXTRACTION Left   . CATARACT EXTRACTION W/PHACO Right 12/09/2015   Procedure: CATARACT EXTRACTION PHACO AND INTRAOCULAR LENS PLACEMENT (IOC);  Surgeon: Tonny Branch, MD;  Location: [facility];  Service: Ophthalmology;  Laterality: Right;  CDE:10.48  . COLONOSCOPY W/ POLYPECTOMY    . cyst on back of neck removed    . DILATION AND CURETTAGE OF UTERUS     x 5  .  ENDARTERECTOMY Left 08/30/2015   Procedure: LEFT CAROYID ENDARTERECTOMY WITH XENOSURE BOVINE PERICARDIUM PATCH ANGIOPLASTY;  Surgeon: [name], MD;  Location: [facility];  Service: Vascular;  Laterality: Left;  . EYE SURGERY    . TONSILLECTOMY    . UPPER GI ENDOSCOPY      Social History   Socioeconomic History  . Marital status: Single    Spouse name: Not on file  . Number of children: Not on file  . Years of education: Not on file  . Highest education level: Not on file  Occupational History  . Not on file  Tobacco Use  . Smoking status: Never Smoker  . Smokeless tobacco: Never Used  Vaping Use  . Vaping Use: Never used  Substance and Sexual Activity  . Alcohol use: No    Alcohol/week: 0.0 standard drinks  . Drug use: Never  . Sexual activity: Never  Other Topics Concern  . Not on file  Social History Narrative  . Not on file   Social Determinants of Health   Financial Resource Strain:   . Difficulty of Paying Living Expenses: Not on file  Food Insecurity:   . Worried About Charity fundraiser in the Last Year: Not on file  . Ran Out of Food in the Last Year:  Not on file  Transportation Needs:   . Lack of Transportation (Medical): Not on file  . Lack of Transportation (Non-Medical): Not on file  Physical Activity:   . Days of Exercise per Week: Not on file  . Minutes of Exercise per Session: Not on file  Stress:   . Feeling of Stress : Not on file  Social Connections:   . Frequency of Communication with Friends and Family: Not on file  . Frequency of Social Gatherings with Friends and Family: Not on file  . Attends Religious Services: Not on file  . Active Member of Clubs or Organizations: Not on file  . Attends Archivist Meetings: Not on file  . Marital Status: Not on file  Intimate Partner Violence:   . Fear of Current or Ex-Partner: Not on file  . Emotionally Abused: Not on file  . Physically Abused: Not on file  . Sexually Abused: Not on file     Family History  Problem Relation Age of Onset  . COPD Father   . Heart failure Father   . Heart disease Father   . Emphysema Father   . Arrhythmia Sister   . Arrhythmia Sister   . Arrhythmia Sister        had PPM also  . Cancer Sister     Current Outpatient Medications  Medication Sig Dispense Refill  . ACCU-CHEK GUIDE test strip USE TO CHECK BLOOD GLUCOSE ONCE DAILY DX E11.9 100 strip 3  . aspirin EC 81 MG tablet Take by mouth.    . blood glucose meter kit and supplies Dispense based on patient and insurance preference. Use up to four times daily as directed. E11.9 1 each 0  . diltiazem (CARDIZEM CD) 180 MG 24 hr capsule TAKE 1 CAPSULE BY MOUTH EVERY DAY 90 capsule 1  . ezetimibe (ZETIA) 10 MG tablet TAKE 1 TABLET BY MOUTH EVERY DAY 90 tablet 3  . flecainide (TAMBOCOR) 150 MG tablet TAKE 0.5 TABLETS (75 MG TOTAL) BY MOUTH 2 (TWO) TIMES DAILY. 90 tablet 3  . furosemide (LASIX) 20 MG tablet Take 1 tablet (20 mg total) by mouth daily as needed for fluid. 90 tablet 0  . glipiZIDE (GLUCOTROL) 5 MG tablet TAKE 1 TABLET BY MOUTH TWICE A DAY 180 tablet 0  . JARDIANCE 25 MG TABS tablet TAKE 1 TBLET BY MOUTH DAILY BEFORE BREAKFAST 30 tablet 2  . Lancet Device MISC Use to test sugar once daily E11.9 100 each 12  . levothyroxine (SYNTHROID) 112 MCG tablet TAKE 1 TABLET BY MOUTH EVERY OTHER DAY, ALTERNATING WITH 1 & 1/2 TABLETS EVERY OTHER DAY 112 tablet 3  . losartan (COZAAR) 25 MG tablet TAKE 1 TABLET BY MOUTH EVERY DAY 90 tablet 1  . lovastatin (MEVACOR) 40 MG tablet TAKE 1 TABLET BY MOUTH EVERY DAY IN THE EVENING 90 tablet 3  . metFORMIN (GLUCOPHAGE) 1000 MG tablet TAKE 1 TABLET (1,000 MG TOTAL) BY MOUTH 2 (TWO) TIMES DAILY WITH A MEAL. 180 tablet 0  . metoprolol succinate (TOPROL-XL) 100 MG 24 hr tablet TAKE 1 TABLET (100 MG TOTAL) BY MOUTH DAILY. TAKE WITH OR IMMEDIATELY FOLLOWING A MEAL. 90 tablet 0  . rOPINIRole (REQUIP) 0.5 MG tablet TAKE 1 TABLET (0.5 MG TOTAL) BY MOUTH AT BEDTIME.  90 tablet 0  . triamcinolone cream (KENALOG) 0.1 % Apply 1 application topically 2 (two) times daily. To neck x7-10d 30 g 0   Current Facility-Administered Medications  Medication Dose Route Frequency Provider Last Rate Last  Admin  . cyanocobalamin ((VITAMIN B-12)) injection 1,000 mcg  1,000 mcg Intramuscular Q30 days Timmothy Euler, MD   1,000 mcg at 03/05/20 5038    Allergies  Allergen Reactions  . Quinapril Hcl Cough  . Statins Other (See Comments)    Not all Statins but some cause cough and pain in legs.  . Tape Other (See Comments)    Redness, please use "paper" tape     REVIEW OF SYSTEMS:  [X]  denotes positive finding, [ ]  denotes negative finding Cardiac  Comments:  Chest pain or chest pressure:    Shortness of breath upon exertion:    Short of breath when lying flat:    Irregular heart rhythm:        Vascular    Pain in calf, thigh, or hip brought on by ambulation:    Pain in feet at night that wakes you up from your sleep:     Blood clot in your veins:    Leg swelling:         Pulmonary    Oxygen at home:    Productive cough:     Wheezing:         Neurologic    Sudden weakness in arms or legs:     Sudden numbness in arms or legs:     Sudden onset of difficulty speaking or slurred speech:    Temporary loss of vision in one eye:     Problems with dizziness:         Gastrointestinal    Blood in stool:     Vomited blood:         Genitourinary    Burning when urinating:     Blood in urine:        Psychiatric    Major depression:         Hematologic    Bleeding problems:    Problems with blood clotting too easily:        Skin    Rashes or ulcers:        Constitutional    Fever or chills:      PHYSICAL EXAMINATION:  Vitals:   04/02/20 1251 04/02/20 1254  BP: (!) 119/56 (!) 144/57  Pulse: (!) 49   Resp: 20   Temp: 98.2 F (36.8 C)   TempSrc: Temporal   SpO2: 96%   Weight: 222 lb 14.4 oz (101.1 kg)   Height: 5' 2.5" (1.588 m)      General:  WDWN in NAD; vital signs documented above Gait: Normal HENT: WNL, normocephalic Pulmonary: normal non-labored breathing , without Rales, rhonchi,  wheezing Cardiac: regular HR, without  Murmurs without carotid bruit Vascular Exam/Pulses:2+ radial pulses, 2+ DP and PT pulses bilaterally Extremities: lower extremities well perfused and warm. Bilateral lower extremities edematous Musculoskeletal: no muscle wasting or atrophy  Neurologic: A&O X 3;  No focal weakness or paresthesias are detected Psychiatric:  The pt has Normal affect.   Non-Invasive Vascular Imaging:   Right Carotid: Velocities in the right ICA are consistent with a 60-79% stenosis (329 PSV, EDV 74). The ECA appears >50% stenosed.   Left Carotid: Velocities in the left ICA are consistent with a 1-39% stenosis. The ECA appears >50% stenosed.    ASSESSMENT/PLAN:: 69 y.o. female here for follow up for carotid stenosis. She is s/p left CEA by Dr. Trula Slade for left ICA stenosis. She has had stable right ICA stenosis of 60-79% which is being monitored. This appears stable on duplex today. There  was some slight increase in her PSV but otherwise stable. She remains without any symptoms. - She will continue her Aspirin and Statin -discussed s/s of stroke and TIA and should these occur she should seek immediate medical attention -She will follow up again in 6 months with repeat carotid duplex   Karoline Caldwell, PA-C [facility] [phone]  Clinic MD:  Dr. Carlis Abbott

## 2020-04-04 ENCOUNTER — Other Ambulatory Visit: Payer: Self-pay | Admitting: Family Medicine

## 2020-04-09 ENCOUNTER — Ambulatory Visit (INDEPENDENT_AMBULATORY_CARE_PROVIDER_SITE_OTHER): Payer: Medicare Other

## 2020-04-09 ENCOUNTER — Other Ambulatory Visit: Payer: Self-pay

## 2020-04-09 DIAGNOSIS — E538 Deficiency of other specified B group vitamins: Secondary | ICD-10-CM

## 2020-04-09 NOTE — Progress Notes (Signed)
B12 injection given and tolerated well.  

## 2020-05-03 ENCOUNTER — Other Ambulatory Visit: Payer: Self-pay | Admitting: Family Medicine

## 2020-05-03 ENCOUNTER — Other Ambulatory Visit: Payer: Self-pay | Admitting: Physician Assistant

## 2020-05-07 ENCOUNTER — Encounter (HOSPITAL_COMMUNITY): Payer: Self-pay

## 2020-05-07 ENCOUNTER — Encounter (HOSPITAL_COMMUNITY): Payer: Self-pay | Admitting: *Deleted

## 2020-05-07 ENCOUNTER — Other Ambulatory Visit: Payer: Self-pay

## 2020-05-07 ENCOUNTER — Ambulatory Visit (HOSPITAL_COMMUNITY): Payer: Medicare Other

## 2020-05-07 NOTE — Progress Notes (Signed)
Patient ID: Emily [name], female   DOB: 06-05-1950, 69 y.o.   MRN: [id number]   Patient arrived for echocardiogram. It was noted in the [facility] 10/21 by Dr. Percival Spanish, the exam was due 10/22. We cancelled the appointment and will reschedule for next year.

## 2020-05-14 ENCOUNTER — Ambulatory Visit (INDEPENDENT_AMBULATORY_CARE_PROVIDER_SITE_OTHER): Payer: Medicare Other | Admitting: *Deleted

## 2020-05-14 ENCOUNTER — Other Ambulatory Visit: Payer: Self-pay

## 2020-05-14 DIAGNOSIS — E538 Deficiency of other specified B group vitamins: Secondary | ICD-10-CM | POA: Diagnosis not present

## 2020-05-14 NOTE — Progress Notes (Signed)
Patient in today for monthly B 12 injection. 1000 mcg given IM in right deltoid. Patient tolerated well.  

## 2020-06-11 ENCOUNTER — Telehealth: Payer: Self-pay | Admitting: *Deleted

## 2020-06-11 NOTE — Chronic Care Management (AMB) (Signed)
  Chronic Care Management   Outreach Note  06/11/2020 Name: Naidelyn Parrella MRN: [id number] DOB: 02/26/1951  Olevia Westervelt is a 70 y.o. year old female who is a primary care patient of Janora Norlander, DO. I reached out to [employer] by phone today in response to a referral sent by Ms. [employer]anch [employer]cardi's health plan.     An unsuccessful telephone outreach was attempted today. The patient was referred to the case management team for assistance with care management and care coordination.   Follow Up Plan: A HIPAA compliant phone message was left for the patient providing contact information and requesting a return call. The care management team will reach out to the patient again over the next 7 days. If patient returns call to provider office, please advise to call [facility] at [phone].  [facility]

## 2020-06-12 NOTE — Chronic Care Management (AMB) (Signed)
  Chronic Care Management   Note  06/12/2020 Name: Emily [name] MRN: [id number] DOB: 1950/08/26  Emily [name] is a 70 y.o. year old female who is a primary care patient of Janora Norlander, DO. I reached out to [employer] by phone today in response to a referral sent by [name]'s health plan.     [name] was given information about Chronic Care Management services today including:  1. CCM service includes personalized support from designated clinical staff supervised by her physician, including individualized plan of care and coordination with other care providers 2. 24/7 contact phone numbers for assistance for urgent and routine care needs. 3. Service will only be billed when office clinical staff spend 20 minutes or more in a month to coordinate care. 4. Only one practitioner may furnish and bill the service in a calendar month. 5. The patient may stop CCM services at any time (effective at the end of the month) by phone call to the office staff. 6. The patient will be responsible for cost sharing (co-pay) of up to 20% of the service fee (after annual deductible is met).  Patient agreed to services and verbal consent obtained.   Follow up plan: Telephone appointment with care management team member scheduled for: 07/02/2020  [facility]

## 2020-06-18 ENCOUNTER — Ambulatory Visit: Payer: Medicare Other | Admitting: Family Medicine

## 2020-06-21 ENCOUNTER — Other Ambulatory Visit: Payer: Self-pay | Admitting: Cardiology

## 2020-06-23 ENCOUNTER — Other Ambulatory Visit: Payer: Self-pay | Admitting: Family Medicine

## 2020-06-23 DIAGNOSIS — E119 Type 2 diabetes mellitus without complications: Secondary | ICD-10-CM

## 2020-06-24 ENCOUNTER — Other Ambulatory Visit: Payer: Self-pay | Admitting: Family Medicine

## 2020-06-24 NOTE — Telephone Encounter (Signed)
OV 08/20/20

## 2020-06-24 NOTE — Telephone Encounter (Signed)
OV 08/20/20 

## 2020-06-25 ENCOUNTER — Ambulatory Visit (HOSPITAL_COMMUNITY): Payer: Medicare Other | Admitting: Hematology

## 2020-07-02 ENCOUNTER — Encounter: Payer: Self-pay | Admitting: *Deleted

## 2020-07-02 ENCOUNTER — Ambulatory Visit (INDEPENDENT_AMBULATORY_CARE_PROVIDER_SITE_OTHER): Payer: Medicare Other

## 2020-07-02 ENCOUNTER — Ambulatory Visit: Payer: Medicare Other | Admitting: *Deleted

## 2020-07-02 ENCOUNTER — Other Ambulatory Visit: Payer: Self-pay

## 2020-07-02 DIAGNOSIS — E538 Deficiency of other specified B group vitamins: Secondary | ICD-10-CM

## 2020-07-02 DIAGNOSIS — E1159 Type 2 diabetes mellitus with other circulatory complications: Secondary | ICD-10-CM

## 2020-07-02 DIAGNOSIS — I152 Hypertension secondary to endocrine disorders: Secondary | ICD-10-CM

## 2020-07-02 DIAGNOSIS — E119 Type 2 diabetes mellitus without complications: Secondary | ICD-10-CM

## 2020-07-02 NOTE — Patient Instructions (Signed)
Ms. Reznik was given information about Chronic Care Management services today including:  1. CCM service includes personalized support from designated clinical staff supervised by her physician, including individualized plan of care and coordination with other care providers 2. 24/7 contact phone numbers for assistance for urgent and routine care needs. 3. Service will only be billed when office clinical staff spend 20 minutes or more in a month to coordinate care. 4. Only one practitioner may furnish and bill the service in a calendar month. 5. The patient may stop CCM services at any time (effective at the end of the month) by phone call to the office staff. 6. The patient will be responsible for cost sharing (co-pay) of up to 20% of the service fee (after annual deductible is met).  Patient did not agree to enrollment in care management services and does not wish to consider at this time.    Plan:  . The patient has been provided with contact information for the care management team and has been advised to call if they would like to re-enroll in the CCM program to receive care management and care coordination services.  . CCM enrollment status changed to "previously enrolled" as per patient request on 07/02/2020  to discontinue enrollment. Case closed to case management services in primary care home.  . Patient was provided with general disease management education and encouraged to follow-up with their PCP and specialists as recommended.   Chong Sicilian, BSN, RN-BC Embedded Chronic Care Manager [facility] Direct Dial: [phone]

## 2020-07-02 NOTE — Progress Notes (Signed)
Cyanocobalamin injection given to left deltoid.  Patient tolerated well. 

## 2020-07-02 NOTE — Chronic Care Management (AMB) (Signed)
  Chronic Care Management   Initial Visit Note  07/02/2020 Name: Emily [name] MRN: [id number] DOB: 10/13/1950  Referred by: Janora Norlander, DO Reason for referral : Chronic Care Management (Initial Visit)   Emily [name] is a 70 y.o. year old female who is a primary care patient of Janora Norlander, DO. The CCM team was consulted for assistance with chronic disease management and care coordination needs related to Atrial Fibrillation, HTN, HLD and DM  Review of patient status, including review of consultants reports, relevant laboratory and other test results was performed prior to outreach.   I spoke with Emily [name] by telephone today regarding management of their chronic medical conditions. They do not have any CCM or resource needs and feel that their medical conditions are well managed. They appreciated the outreach, but do not feel that CCM services are needed at this time. They will reach out in the future if a need arises.   SDOH (Social Determinants of Health) assessments performed: Yes See Care Plan activities for detailed interventions related to SDOH     Plan:  . The patient has been provided with contact information for the care management team and has been advised to call if they would like to re-enroll in the CCM program to receive care management and care coordination services.  . CCM enrollment status changed to "previously enrolled" as per patient request on 07/02/2020  to discontinue enrollment. Case closed to case management services in primary care home.  . Patient was provided with general disease management education and encouraged to follow-up with their PCP and specialists as recommended.   Emily [name], BSN, RN-BC Embedded Chronic Care Manager [facility] Direct Dial: [phone]

## 2020-07-17 ENCOUNTER — Telehealth: Payer: Self-pay | Admitting: Cardiology

## 2020-07-17 NOTE — Telephone Encounter (Signed)
    Pt said she had Afib episodes. The first one was on 07/09/20 right after midnight her heart went out of rhythm she took her meds and in 30 mins she went back to rhythm. Last night it happened again, around 12:30 am it woke her up, she went to Afib again, she took her meds and in 15 mins her heart went back to rhythm. She said she only want for Dr. Warren Lacy to be aware.

## 2020-07-17 NOTE — Telephone Encounter (Signed)
Spoke to patient she wanted Dr.Hochrein to know she has had 2 episodes of Afib.Stated she had a episode last Tue that lasted appox 45 min and a episode last night that lasted appox 15 min.She took Diltiazem each time.She feels good today just alittle tired.I will make Dr.Hochrein aware.

## 2020-07-17 NOTE — Telephone Encounter (Signed)
OK.  No change in therapy.

## 2020-07-29 ENCOUNTER — Other Ambulatory Visit: Payer: Self-pay | Admitting: Family Medicine

## 2020-07-30 ENCOUNTER — Ambulatory Visit (INDEPENDENT_AMBULATORY_CARE_PROVIDER_SITE_OTHER): Payer: Medicare Other | Admitting: *Deleted

## 2020-07-30 ENCOUNTER — Other Ambulatory Visit: Payer: Self-pay

## 2020-07-30 DIAGNOSIS — E538 Deficiency of other specified B group vitamins: Secondary | ICD-10-CM | POA: Diagnosis not present

## 2020-08-20 ENCOUNTER — Other Ambulatory Visit: Payer: Self-pay

## 2020-08-20 ENCOUNTER — Encounter: Payer: Self-pay | Admitting: Family Medicine

## 2020-08-20 ENCOUNTER — Ambulatory Visit (INDEPENDENT_AMBULATORY_CARE_PROVIDER_SITE_OTHER): Payer: Medicare Other | Admitting: Family Medicine

## 2020-08-20 VITALS — BP 116/53 | HR 51 | Temp 98.4°F | Resp 20 | Ht 62.5 in | Wt 221.0 lb

## 2020-08-20 DIAGNOSIS — E1169 Type 2 diabetes mellitus with other specified complication: Secondary | ICD-10-CM | POA: Diagnosis not present

## 2020-08-20 DIAGNOSIS — I480 Paroxysmal atrial fibrillation: Secondary | ICD-10-CM

## 2020-08-20 DIAGNOSIS — Z8719 Personal history of other diseases of the digestive system: Secondary | ICD-10-CM | POA: Diagnosis not present

## 2020-08-20 DIAGNOSIS — I152 Hypertension secondary to endocrine disorders: Secondary | ICD-10-CM | POA: Diagnosis not present

## 2020-08-20 DIAGNOSIS — E034 Atrophy of thyroid (acquired): Secondary | ICD-10-CM

## 2020-08-20 DIAGNOSIS — E785 Hyperlipidemia, unspecified: Secondary | ICD-10-CM

## 2020-08-20 DIAGNOSIS — E1159 Type 2 diabetes mellitus with other circulatory complications: Secondary | ICD-10-CM

## 2020-08-20 DIAGNOSIS — L304 Erythema intertrigo: Secondary | ICD-10-CM

## 2020-08-20 DIAGNOSIS — R6889 Other general symptoms and signs: Secondary | ICD-10-CM | POA: Diagnosis not present

## 2020-08-20 LAB — HEMOGLOBIN, FINGERSTICK: Hemoglobin: 12.4 g/dL (ref 11.1–15.9)

## 2020-08-20 LAB — BAYER DCA HB A1C WAIVED: HB A1C (BAYER DCA - WAIVED): 6.2 % (ref <7.0)

## 2020-08-20 MED ORDER — KETOCONAZOLE 2 % EX CREA: 1.0000 "application " | TOPICAL_CREAM | Freq: Every day | CUTANEOUS | 1 refills | Status: DC

## 2020-08-20 NOTE — Progress Notes (Signed)
Subjective: CC: Dm PCP: Janora Norlander, DO RAQ:[id number] Emily [name] is a 70 y.o. female presenting to clinic today for:  1. Type 2 Diabetes with hypertension, hyperlipidemia:  Patient reports compliance with Jardiance, Metformin and glipizide.  She denies any chest pain, shortness of breath, heart palpitations or dizziness.  She has had ongoing vaginitis that seems to be relieved by ketoconazole cream, which she would like a refill on today.  She does not wish to switch from [place] despite genital irritation.  No dysuria.  Last eye exam: Up-to-date Last foot exam: Up-to-date Last A1c:  Lab Results  Component Value Date   HGBA1C 6.8 11/28/2019   Nephropathy screen indicated?:  On ARB Last flu, zoster and/or pneumovax:  Immunization History  Administered Date(s) Administered  . Fluad Quad(high Dose 65+) 02/28/2019, 03/05/2020  . Influenza, High Dose Seasonal PF 03/24/2016, 03/22/2018  . Influenza, Seasonal, Injecte, Preservative Fre 06/05/2014, 07/02/2015  . Influenza-Unspecified 03/24/2016  . PFIZER(Purple Top)SARS-COV-2 Vaccination 07/10/2019, 08/03/2019, 03/13/2020  . Pneumococcal Conjugate-13 07/09/2015  . Pneumococcal Polysaccharide-23 01/30/2014, 02/28/2019  . Tdap 05/26/2014    2. Hypothyroidism Compliant with Synthroid 1 tablet alternating with 1/2 tablet every other day.  No reports of difficulty swallowing, tremor, heart palpitations, change in bowel habit or weight  3. GI bleed No active GI bleeding.  They were able ultimately to find a GI bleed at [facility] but it was no longer bleeding and therefore no interventions were performed.  She is not on any blood thinners but she does continue to take a daily aspirin for atrial fibrillation.  ROS: Per HPI  Allergies  Allergen Reactions  . Quinapril Hcl Cough  . Statins Other (See Comments)    Not all Statins but some cause cough and pain in legs.  . Tape Other (See Comments)    Redness, please use "paper" tape    Past Medical History:  Diagnosis Date  . Anemia   . Arthritis   . Atrial fibrillation, rapid ([place]) 10/27/2013  . Bilateral carotid artery disease ([place])   . Diabetes mellitus without complication ([place])   . Difficult intubation    states 'lady that did the sleep study told me I have the smallest airway she has ever seen in an adult"  . Dyslipidemia 06/29/2017  . Dysrhythmia   . Hypertension   . Hypothyroid   . Obesity   . Obstructive sleep apnea    on C Pap  . PAF (paroxysmal atrial fibrillation) ([place])    a. newly dx in 09/2013; on eliquis    Current Outpatient Medications:  .  ACCU-CHEK GUIDE test strip, USE TO CHECK BLOOD GLUCOSE ONCE DAILY DX E11.9, Disp: 100 strip, Rfl: 3 .  aspirin EC 81 MG tablet, Take by mouth., Disp: , Rfl:  .  blood glucose meter kit and supplies, Dispense based on patient and insurance preference. Use up to four times daily as directed. E11.9, Disp: 1 each, Rfl: 0 .  diltiazem (CARDIZEM CD) 180 MG 24 hr capsule, TAKE 1 CAPSULE BY MOUTH EVERY DAY, Disp: 90 capsule, Rfl: 1 .  ezetimibe (ZETIA) 10 MG tablet, TAKE 1 TABLET BY MOUTH EVERY DAY, Disp: 90 tablet, Rfl: 3 .  flecainide (TAMBOCOR) 150 MG tablet, TAKE 0.5 TABLETS (75 MG TOTAL) BY MOUTH 2 (TWO) TIMES DAILY., Disp: 90 tablet, Rfl: 0 .  furosemide (LASIX) 20 MG tablet, Take 1 tablet (20 mg total) by mouth daily as needed for fluid., Disp: 90 tablet, Rfl: 0 .  glipiZIDE (GLUCOTROL) 5 MG tablet, TAKE  1 TABLET BY MOUTH TWICE A DAY, Disp: 180 tablet, Rfl: 0 .  JARDIANCE 25 MG TABS tablet, TAKE 1 TBLET BY MOUTH DAILY BEFORE BREAKFAST, Disp: 30 tablet, Rfl: 1 .  Lancet Device MISC, Use to test sugar once daily E11.9, Disp: 100 each, Rfl: 12 .  levothyroxine (SYNTHROID) 112 MCG tablet, TAKE 1 TABLET BY MOUTH EVERY OTHER DAY, ALTERNATING WITH 1 & 1/2 TABLETS EVERY OTHER DAY, Disp: 112 tablet, Rfl: 3 .  losartan (COZAAR) 25 MG tablet, TAKE 1 TABLET BY MOUTH EVERY DAY, Disp: 90 tablet, Rfl: 2 .  lovastatin (MEVACOR)  40 MG tablet, TAKE 1 TABLET BY MOUTH EVERY DAY IN THE EVENING, Disp: 90 tablet, Rfl: 3 .  metFORMIN (GLUCOPHAGE) 1000 MG tablet, TAKE 1 TABLET (1,000 MG TOTAL) BY MOUTH 2 (TWO) TIMES DAILY WITH A MEAL., Disp: 180 tablet, Rfl: 0 .  metoprolol succinate (TOPROL-XL) 100 MG 24 hr tablet, TAKE 1 TABLET (100 MG TOTAL) BY MOUTH DAILY. TAKE WITH OR IMMEDIATELY FOLLOWING A MEAL., Disp: 90 tablet, Rfl: 0 .  rOPINIRole (REQUIP) 0.5 MG tablet, TAKE 1 TABLET (0.5 MG TOTAL) BY MOUTH AT BEDTIME., Disp: 90 tablet, Rfl: 0 .  triamcinolone cream (KENALOG) 0.1 %, Apply 1 application topically 2 (two) times daily. To neck x7-10d, Disp: 30 g, Rfl: 0  Current Facility-Administered Medications:  .  cyanocobalamin ((VITAMIN B-12)) injection 1,000 mcg, 1,000 mcg, Intramuscular, Q30 days, Timmothy Euler, MD, 1,000 mcg at 07/30/20 1521 Social History   Socioeconomic History  . Marital status: Single    Spouse name: Not on file  . Number of children: Not on file  . Years of education: Not on file  . Highest education level: Not on file  Occupational History  . Not on file  Tobacco Use  . Smoking status: Never Smoker  . Smokeless tobacco: Never Used  Vaping Use  . Vaping Use: Never used  Substance and Sexual Activity  . Alcohol use: No    Alcohol/week: 0.0 standard drinks  . Drug use: Never  . Sexual activity: Never  Other Topics Concern  . Not on file  Social History Narrative  . Not on file   Social Determinants of Health   Financial Resource Strain: Not on file  Food Insecurity: Not on file  Transportation Needs: Not on file  Physical Activity: Not on file  Stress: Not on file  Social Connections: Not on file  Intimate Partner Violence: Not on file   Family History  Problem Relation Age of Onset  . COPD Father   . Heart failure Father   . Heart disease Father   . Emphysema Father   . Arrhythmia Sister   . Arrhythmia Sister   . Arrhythmia Sister        had PPM also  . Cancer Sister      Objective: Office vital signs reviewed. BP (!) 116/53   Pulse (!) 51   Temp 98.4 F (36.9 C)   Resp 20   Ht 5' 2.5" (1.588 m)   Wt 221 lb (100.2 kg)   SpO2 97%   BMI 39.78 kg/m   Physical Examination:  General: Awake, alert, well nourished, No acute distress HEENT: Normal; no exophthalmos.  No goiter Cardio: regular rate and rhythm, S1S2 heard, no murmurs appreciated Pulm: clear to auscultation bilaterally, no wheezes, rhonchi or rales; normal work of breathing on room air Extremities: warm, well perfused, No edema, cyanosis or clubbing; +2 pulses bilaterally MSK: normal gait and station  Assessment/ Plan: 69  y.o. female   Controlled type 2 diabetes mellitus with other specified complication, without long-term current use of insulin ([place]) - Plan: Bayer DCA Hb A1c Waived  Hyperlipidemia associated with type 2 diabetes mellitus ([place]) - Plan: CMP14+EGFR  Hypertension associated with diabetes ([place]) - Plan: CMP14+EGFR  Hypothyroidism due to acquired atrophy of thyroid - Plan: TSH, T4, Free  PAF (paroxysmal atrial fibrillation) ([facility]) - Plan: Hemoglobin, fingerstick, CMP14+EGFR  History of GI bleed - Plan: Anemia panel, Hemoglobin, fingerstick  Intertrigo - Plan: ketoconazole (NIZORAL) 2 % cream  Diabetes under excellent control.  Continue current regimen.  Offered changing Jardiance to [place]belsus but she declined this today.  Continue statin, Zetia  Blood pressure well controlled.  Continue current regimen  Patient appear to be rate and rhythm controlled today.  Hemoglobin was normal range today. No active bleeding  Ketoconazole renewed for intertrigo  No orders of the defined types were placed in this encounter.  No orders of the defined types were placed in this encounter.    Janora Norlander, DO [facility] [phone]

## 2020-08-21 LAB — ANEMIA PANEL
Ferritin: 85 ng/mL (ref 15–150)
Folate, Hemolysate: 594 ng/mL
Folate, RBC: 1559 ng/mL (ref >498)
Hematocrit: 38.1 % (ref 34.0–46.6)
Iron Saturation: 17 % (ref 15–55)
Iron: 54 ug/dL (ref 27–139)
Retic Ct Pct: 2.9 % — ABNORMAL HIGH (ref 0.6–2.6)
Total Iron Binding Capacity: 312 ug/dL (ref 250–450)
UIBC: 258 ug/dL (ref 118–369)
Vitamin B-12: 467 pg/mL (ref 232–1245)

## 2020-08-21 LAB — T4, FREE: Free T4: 1.45 ng/dL (ref 0.82–1.77)

## 2020-08-21 LAB — CMP14+EGFR
ALT: 16 IU/L (ref 0–32)
AST: 20 IU/L (ref 0–40)
Albumin/Globulin Ratio: 1.5 (ref 1.2–2.2)
Albumin: 4.4 g/dL (ref 3.8–4.8)
Alkaline Phosphatase: 111 IU/L (ref 44–121)
BUN/Creatinine Ratio: 47 — ABNORMAL HIGH (ref 12–28)
BUN: 28 mg/dL — ABNORMAL HIGH (ref 8–27)
Bilirubin Total: 0.5 mg/dL (ref 0.0–1.2)
CO2: 21 mmol/L (ref 20–29)
Calcium: 9.4 mg/dL (ref 8.7–10.3)
Chloride: 102 mmol/L (ref 96–106)
Creatinine, Ser: 0.59 mg/dL (ref 0.57–1.00)
Globulin, Total: 2.9 g/dL (ref 1.5–4.5)
Glucose: 119 mg/dL — ABNORMAL HIGH (ref 65–99)
Potassium: 4.3 mmol/L (ref 3.5–5.2)
Sodium: 138 mmol/L (ref 134–144)
Total Protein: 7.3 g/dL (ref 6.0–8.5)
eGFR: 97 mL/min/{1.73_m2} (ref >59)

## 2020-08-21 LAB — TSH: TSH: 1.33 u[IU]/mL (ref 0.450–4.500)

## 2020-08-22 ENCOUNTER — Other Ambulatory Visit: Payer: Self-pay | Admitting: Cardiology

## 2020-08-23 ENCOUNTER — Other Ambulatory Visit: Payer: Self-pay | Admitting: Family Medicine

## 2020-08-23 DIAGNOSIS — E119 Type 2 diabetes mellitus without complications: Secondary | ICD-10-CM

## 2020-08-27 ENCOUNTER — Other Ambulatory Visit: Payer: Self-pay

## 2020-08-27 ENCOUNTER — Ambulatory Visit (INDEPENDENT_AMBULATORY_CARE_PROVIDER_SITE_OTHER): Payer: Medicare Other | Admitting: *Deleted

## 2020-08-27 DIAGNOSIS — E538 Deficiency of other specified B group vitamins: Secondary | ICD-10-CM

## 2020-08-27 NOTE — Progress Notes (Signed)
Pt given B12 injection IM left deltoid and tolerated well. °

## 2020-08-29 DIAGNOSIS — R6889 Other general symptoms and signs: Secondary | ICD-10-CM | POA: Diagnosis not present

## 2020-08-29 DIAGNOSIS — I499 Cardiac arrhythmia, unspecified: Secondary | ICD-10-CM | POA: Diagnosis not present

## 2020-08-29 DIAGNOSIS — I4891 Unspecified atrial fibrillation: Secondary | ICD-10-CM | POA: Diagnosis not present

## 2020-08-29 DIAGNOSIS — R1110 Vomiting, unspecified: Secondary | ICD-10-CM | POA: Diagnosis not present

## 2020-08-29 DIAGNOSIS — Z743 Need for continuous supervision: Secondary | ICD-10-CM | POA: Diagnosis not present

## 2020-08-30 ENCOUNTER — Encounter (HOSPITAL_COMMUNITY): Payer: Self-pay

## 2020-08-30 ENCOUNTER — Other Ambulatory Visit: Payer: Self-pay

## 2020-08-30 ENCOUNTER — Inpatient Hospital Stay (HOSPITAL_BASED_OUTPATIENT_CLINIC_OR_DEPARTMENT_OTHER): Payer: Medicare Other

## 2020-08-30 ENCOUNTER — Inpatient Hospital Stay (HOSPITAL_COMMUNITY): Payer: Medicare Other

## 2020-08-30 ENCOUNTER — Observation Stay (HOSPITAL_COMMUNITY)
Admission: EM | Admit: 2020-08-30 | Discharge: 2020-08-31 | Disposition: A | Discharge status: Home / Self Care | Payer: Medicare Other | Attending: Internal Medicine | Admitting: Internal Medicine

## 2020-08-30 DIAGNOSIS — I5032 Chronic diastolic (congestive) heart failure: Secondary | ICD-10-CM | POA: Diagnosis not present

## 2020-08-30 DIAGNOSIS — Z7984 Long term (current) use of oral hypoglycemic drugs: Secondary | ICD-10-CM | POA: Diagnosis not present

## 2020-08-30 DIAGNOSIS — Z79899 Other long term (current) drug therapy: Secondary | ICD-10-CM | POA: Insufficient documentation

## 2020-08-30 DIAGNOSIS — E039 Hypothyroidism, unspecified: Secondary | ICD-10-CM | POA: Insufficient documentation

## 2020-08-30 DIAGNOSIS — Z20822 Contact with and (suspected) exposure to covid-19: Secondary | ICD-10-CM | POA: Diagnosis not present

## 2020-08-30 DIAGNOSIS — E785 Hyperlipidemia, unspecified: Secondary | ICD-10-CM

## 2020-08-30 DIAGNOSIS — R000 Tachycardia, unspecified: Secondary | ICD-10-CM | POA: Diagnosis present

## 2020-08-30 DIAGNOSIS — I480 Paroxysmal atrial fibrillation: Principal | ICD-10-CM | POA: Insufficient documentation

## 2020-08-30 DIAGNOSIS — K529 Noninfective gastroenteritis and colitis, unspecified: Secondary | ICD-10-CM

## 2020-08-30 DIAGNOSIS — R079 Chest pain, unspecified: Secondary | ICD-10-CM | POA: Diagnosis present

## 2020-08-30 DIAGNOSIS — I352 Nonrheumatic aortic (valve) stenosis with insufficiency: Secondary | ICD-10-CM

## 2020-08-30 DIAGNOSIS — I4891 Unspecified atrial fibrillation: Secondary | ICD-10-CM | POA: Diagnosis present

## 2020-08-30 DIAGNOSIS — I110 Hypertensive heart disease with heart failure: Secondary | ICD-10-CM | POA: Diagnosis not present

## 2020-08-30 DIAGNOSIS — R778 Other specified abnormalities of plasma proteins: Secondary | ICD-10-CM | POA: Diagnosis not present

## 2020-08-30 DIAGNOSIS — Z7901 Long term (current) use of anticoagulants: Secondary | ICD-10-CM | POA: Diagnosis not present

## 2020-08-30 DIAGNOSIS — E1165 Type 2 diabetes mellitus with hyperglycemia: Secondary | ICD-10-CM | POA: Diagnosis not present

## 2020-08-30 DIAGNOSIS — N390 Urinary tract infection, site not specified: Secondary | ICD-10-CM | POA: Diagnosis not present

## 2020-08-30 DIAGNOSIS — E118 Type 2 diabetes mellitus with unspecified complications: Secondary | ICD-10-CM

## 2020-08-30 DIAGNOSIS — Z7982 Long term (current) use of aspirin: Secondary | ICD-10-CM | POA: Diagnosis not present

## 2020-08-30 DIAGNOSIS — E1169 Type 2 diabetes mellitus with other specified complication: Secondary | ICD-10-CM

## 2020-08-30 HISTORY — DX: Unspecified atrial fibrillation: I48.91

## 2020-08-30 LAB — COMPREHENSIVE METABOLIC PANEL
ALT: 22 U/L (ref 0–44)
ALT: 24 U/L (ref 0–44)
AST: 23 U/L (ref 15–41)
AST: 31 U/L (ref 15–41)
Albumin: 4.1 g/dL (ref 3.5–5.0)
Albumin: 3.9 g/dL (ref 3.5–5.0)
Alkaline Phosphatase: 96 U/L (ref 38–126)
Alkaline Phosphatase: 93 U/L (ref 38–126)
Anion gap: 13 (ref 5–15)
Anion gap: 13 (ref 5–15)
BUN: 36 mg/dL — ABNORMAL HIGH (ref 8–23)
BUN: 34 mg/dL — ABNORMAL HIGH (ref 8–23)
CO2: 20 mmol/L — ABNORMAL LOW (ref 22–32)
CO2: 22 mmol/L (ref 22–32)
Calcium: 8.5 mg/dL — ABNORMAL LOW (ref 8.9–10.3)
Calcium: 8.3 mg/dL — ABNORMAL LOW (ref 8.9–10.3)
Chloride: 105 mmol/L (ref 98–111)
Chloride: 105 mmol/L (ref 98–111)
Creatinine, Ser: 0.69 mg/dL (ref 0.44–1.00)
Creatinine, Ser: 0.61 mg/dL (ref 0.44–1.00)
GFR, Estimated: >60 mL/min (ref >60)
GFR, Estimated: >60 mL/min (ref >60)
Glucose, Bld: 254 mg/dL — ABNORMAL HIGH (ref 70–99)
Glucose, Bld: 193 mg/dL — ABNORMAL HIGH (ref 70–99)
Potassium: 3.7 mmol/L (ref 3.5–5.1)
Potassium: 3.9 mmol/L (ref 3.5–5.1)
Sodium: 138 mmol/L (ref 135–145)
Sodium: 140 mmol/L (ref 135–145)
Total Bilirubin: 1.8 mg/dL — ABNORMAL HIGH (ref 0.3–1.2)
Total Bilirubin: 1.4 mg/dL — ABNORMAL HIGH (ref 0.3–1.2)
Total Protein: 7.6 g/dL (ref 6.5–8.1)
Total Protein: 7.2 g/dL (ref 6.5–8.1)

## 2020-08-30 LAB — ECHOCARDIOGRAM COMPLETE
AR max vel: 1.26 cm2
AV Area VTI: 1.29 cm2
AV Area mean vel: 1.28 cm2
AV Mean grad: 20 mmHg
AV Peak grad: 33.9 mmHg
Ao pk vel: 2.91 m/s
Area-P 1/2: 2.2 cm2
Height: 62.5 in
S' Lateral: 2.7 cm
Weight: 3536 oz

## 2020-08-30 LAB — GLUCOSE, CAPILLARY
Glucose-Capillary: 159 mg/dL — ABNORMAL HIGH (ref 70–99)
Glucose-Capillary: 171 mg/dL — ABNORMAL HIGH (ref 70–99)

## 2020-08-30 LAB — URINALYSIS, COMPLETE (UACMP) WITH MICROSCOPIC
Bilirubin Urine: NEGATIVE
Glucose, UA: >=500 mg/dL — AB
Hgb urine dipstick: NEGATIVE
Ketones, ur: 80 mg/dL — AB
Nitrite: POSITIVE — AB
Protein, ur: NEGATIVE mg/dL
Specific Gravity, Urine: 1.03 (ref 1.005–1.030)
WBC, UA: >50 WBC/hpf — ABNORMAL HIGH (ref 0–5)
pH: 5 (ref 5.0–8.0)

## 2020-08-30 LAB — CBC WITH DIFFERENTIAL/PLATELET
Abs Immature Granulocytes: 0.07 10*3/uL (ref 0.00–0.07)
Abs Immature Granulocytes: 0.1 10*3/uL — ABNORMAL HIGH (ref 0.00–0.07)
Basophils Absolute: 0 10*3/uL (ref 0.0–0.1)
Basophils Absolute: 0.1 10*3/uL (ref 0.0–0.1)
Basophils Relative: 0 %
Basophils Relative: 0 %
Eosinophils Absolute: 0 10*3/uL (ref 0.0–0.5)
Eosinophils Absolute: 0 10*3/uL (ref 0.0–0.5)
Eosinophils Relative: 0 %
Eosinophils Relative: 0 %
HCT: 39.5 % (ref 36.0–46.0)
HCT: 40.8 % (ref 36.0–46.0)
Hemoglobin: 12.2 g/dL (ref 12.0–15.0)
Hemoglobin: 12.9 g/dL (ref 12.0–15.0)
Immature Granulocytes: 1 %
Immature Granulocytes: 1 %
Lymphocytes Relative: 2 %
Lymphocytes Relative: 2 %
Lymphs Abs: 0.2 10*3/uL — ABNORMAL LOW (ref 0.7–4.0)
Lymphs Abs: 0.4 10*3/uL — ABNORMAL LOW (ref 0.7–4.0)
MCH: 29.5 pg (ref 26.0–34.0)
MCH: 29.7 pg (ref 26.0–34.0)
MCHC: 30.9 g/dL (ref 30.0–36.0)
MCHC: 31.6 g/dL (ref 30.0–36.0)
MCV: 93.8 fL (ref 80.0–100.0)
MCV: 95.6 fL (ref 80.0–100.0)
Monocytes Absolute: 0.2 10*3/uL (ref 0.1–1.0)
Monocytes Absolute: 0.4 10*3/uL (ref 0.1–1.0)
Monocytes Relative: 2 %
Monocytes Relative: 2 %
Neutro Abs: 13.7 10*3/uL — ABNORMAL HIGH (ref 1.7–7.7)
Neutro Abs: 9 10*3/uL — ABNORMAL HIGH (ref 1.7–7.7)
Neutrophils Relative %: 95 %
Neutrophils Relative %: 95 %
Platelets: 215 10*3/uL (ref 150–400)
Platelets: 335 10*3/uL (ref 150–400)
RBC: 4.13 MIL/uL (ref 3.87–5.11)
RBC: 4.35 MIL/uL (ref 3.87–5.11)
RDW: 15.8 % — ABNORMAL HIGH (ref 11.5–15.5)
RDW: 15.9 % — ABNORMAL HIGH (ref 11.5–15.5)
WBC: 14.5 10*3/uL — ABNORMAL HIGH (ref 4.0–10.5)
WBC: 9.5 10*3/uL (ref 4.0–10.5)
nRBC: 0 % (ref 0.0–0.2)
nRBC: 0 % (ref 0.0–0.2)

## 2020-08-30 LAB — TSH: TSH: 0.802 u[IU]/mL (ref 0.350–4.500)

## 2020-08-30 LAB — MAGNESIUM: Magnesium: 2 mg/dL (ref 1.7–2.4)

## 2020-08-30 LAB — LIPASE, BLOOD: Lipase: 22 U/L (ref 11–51)

## 2020-08-30 LAB — TROPONIN I (HIGH SENSITIVITY)
Troponin I (High Sensitivity): 5 ng/L (ref <18)
Troponin I (High Sensitivity): 20 ng/L — ABNORMAL HIGH (ref <18)
Troponin I (High Sensitivity): 422 ng/L (ref <18)
Troponin I (High Sensitivity): 1207 ng/L (ref <18)
Troponin I (High Sensitivity): 1738 ng/L (ref <18)
Troponin I (High Sensitivity): 1193 ng/L (ref <18)

## 2020-08-30 LAB — RESP PANEL BY RT-PCR (FLU A&B, COVID) ARPGX2
Influenza A by PCR: NEGATIVE
Influenza B by PCR: NEGATIVE
SARS Coronavirus 2 by RT PCR: NEGATIVE

## 2020-08-30 LAB — CBG MONITORING, ED
Glucose-Capillary: 144 mg/dL — ABNORMAL HIGH (ref 70–99)
Glucose-Capillary: 124 mg/dL — ABNORMAL HIGH (ref 70–99)

## 2020-08-30 LAB — HIV ANTIBODY (ROUTINE TESTING W REFLEX): HIV Screen 4th Generation wRfx: NONREACTIVE

## 2020-08-30 MED ORDER — OXYCODONE HCL 5 MG PO TABS: 5.0000 mg | ORAL_TABLET | ORAL | Status: DC | PRN

## 2020-08-30 MED ORDER — EZETIMIBE 10 MG PO TABS
10.0000 mg | ORAL_TABLET | Freq: Every day | ORAL | Status: DC
  Administered 2020-08-30 – 2020-08-31 (×2): 10 mg via ORAL
  Filled 2020-08-30 (×2): qty 1

## 2020-08-30 MED ORDER — INSULIN ASPART 100 UNIT/ML ~~LOC~~ SOLN: 0.0000 [IU] | Freq: Every day | SUBCUTANEOUS | Status: DC

## 2020-08-30 MED ORDER — FLECAINIDE ACETATE 50 MG PO TABS
75.0000 mg | ORAL_TABLET | Freq: Two times a day (BID) | ORAL | Status: DC
  Administered 2020-08-30 – 2020-08-31 (×3): 75 mg via ORAL
  Filled 2020-08-30 (×2): qty 2

## 2020-08-30 MED ORDER — SODIUM CHLORIDE 0.9 % IV BOLUS
500.0000 mL | Freq: Once | INTRAVENOUS | Status: AC
  Administered 2020-08-30: 500 mL via INTRAVENOUS

## 2020-08-30 MED ORDER — ASPIRIN EC 81 MG PO TBEC
81.0000 mg | DELAYED_RELEASE_TABLET | Freq: Every day | ORAL | Status: DC
  Administered 2020-08-30 – 2020-08-31 (×2): 81 mg via ORAL
  Filled 2020-08-30 (×2): qty 1

## 2020-08-30 MED ORDER — LEVOTHYROXINE SODIUM 112 MCG PO TABS
112.0000 ug | ORAL_TABLET | Freq: Every day | ORAL | Status: DC
  Administered 2020-08-30 – 2020-08-31 (×2): 112 ug via ORAL
  Filled 2020-08-30 (×2): qty 1

## 2020-08-30 MED ORDER — POLYETHYLENE GLYCOL 3350 17 G PO PACK: 17.0000 g | PACK | Freq: Every day | ORAL | Status: DC | PRN

## 2020-08-30 MED ORDER — HEPARIN SODIUM (PORCINE) 5000 UNIT/ML IJ SOLN
5000.0000 [IU] | Freq: Three times a day (TID) | INTRAMUSCULAR | Status: DC
  Administered 2020-08-30 – 2020-08-31 (×3): 5000 [IU] via SUBCUTANEOUS
  Filled 2020-08-30 (×4): qty 1

## 2020-08-30 MED ORDER — SODIUM CHLORIDE 0.9 % IV SOLN
1.0000 g | INTRAVENOUS | Status: DC
  Administered 2020-08-30 – 2020-08-31 (×2): 1 g via INTRAVENOUS
  Filled 2020-08-30 (×2): qty 10

## 2020-08-30 MED ORDER — DILTIAZEM HCL ER COATED BEADS 180 MG PO CP24
180.0000 mg | ORAL_CAPSULE | Freq: Every day | ORAL | Status: DC
  Administered 2020-08-30 – 2020-08-31 (×2): 180 mg via ORAL
  Filled 2020-08-30 (×2): qty 1

## 2020-08-30 MED ORDER — ROPINIROLE HCL 0.25 MG PO TABS
0.5000 mg | ORAL_TABLET | Freq: Every day | ORAL | Status: DC
  Administered 2020-08-30: 0.5 mg via ORAL
  Filled 2020-08-30: qty 2

## 2020-08-30 MED ORDER — DILTIAZEM HCL-DEXTROSE 125-5 MG/125ML-% IV SOLN (PREMIX)
5.0000 mg/h | INTRAVENOUS | Status: DC
  Administered 2020-08-30: 5 mg/h via INTRAVENOUS
  Filled 2020-08-30: qty 125

## 2020-08-30 MED ORDER — LOSARTAN POTASSIUM 25 MG PO TABS
25.0000 mg | ORAL_TABLET | Freq: Every day | ORAL | Status: DC
  Filled 2020-08-30: qty 1

## 2020-08-30 MED ORDER — ONDANSETRON HCL 4 MG PO TABS: 4.0000 mg | ORAL_TABLET | Freq: Four times a day (QID) | ORAL | Status: DC | PRN

## 2020-08-30 MED ORDER — ACETAMINOPHEN 325 MG PO TABS: 650.0000 mg | ORAL_TABLET | Freq: Four times a day (QID) | ORAL | Status: DC | PRN

## 2020-08-30 MED ORDER — DILTIAZEM HCL ER COATED BEADS 120 MG PO CP24
120.0000 mg | ORAL_CAPSULE | Freq: Every day | ORAL | Status: DC
  Filled 2020-08-30: qty 1

## 2020-08-30 MED ORDER — INSULIN ASPART 100 UNIT/ML ~~LOC~~ SOLN
0.0000 [IU] | Freq: Three times a day (TID) | SUBCUTANEOUS | Status: DC
  Administered 2020-08-30: 3 [IU] via SUBCUTANEOUS
  Administered 2020-08-30: 2 [IU] via SUBCUTANEOUS
  Administered 2020-08-31: 3 [IU] via SUBCUTANEOUS
  Administered 2020-08-31: 2 [IU] via SUBCUTANEOUS
  Filled 2020-08-30: qty 1

## 2020-08-30 MED ORDER — ONDANSETRON HCL 4 MG/2ML IJ SOLN: 4.0000 mg | Freq: Four times a day (QID) | INTRAMUSCULAR | Status: DC | PRN

## 2020-08-30 MED ORDER — METOPROLOL SUCCINATE ER 50 MG PO TB24
100.0000 mg | ORAL_TABLET | Freq: Every day | ORAL | Status: DC
Start: 2020-08-30 — End: 2020-08-31
  Administered 2020-08-30 – 2020-08-31 (×2): 100 mg via ORAL
  Filled 2020-08-30 (×2): qty 2

## 2020-08-30 MED ORDER — ACETAMINOPHEN 650 MG RE SUPP: 650.0000 mg | Freq: Four times a day (QID) | RECTAL | Status: DC | PRN

## 2020-08-30 MED ORDER — PRAVASTATIN SODIUM 10 MG PO TABS
10.0000 mg | ORAL_TABLET | Freq: Every day | ORAL | Status: DC
  Administered 2020-08-30: 10 mg via ORAL
  Filled 2020-08-30: qty 1

## 2020-08-30 MED ORDER — PERFLUTREN LIPID MICROSPHERE
1.0000 mL | INTRAVENOUS | Status: AC | PRN
  Administered 2020-08-30: 2 mL via INTRAVENOUS

## 2020-08-30 NOTE — ED Triage Notes (Signed)
rcems from home with cc of a fib with rvr. Called for dehydration. Said that since lunch time she has been having nausea vomiting(x5) diarrhea (x5). Also says that she is having neck pain, says this is always where it hurts when she has afib. Takes baby aspirin.

## 2020-08-30 NOTE — ED Notes (Signed)
Pt provided lunch, sitting up in room eating. No additional needs voiced at this time. Remains pain-free, denies dyspnea or weakness. Updated her on plan of care for admission. Bed remains low and locked, call bell in reach.

## 2020-08-30 NOTE — ED Notes (Signed)
Dr Dyann Kief at bedside

## 2020-08-30 NOTE — ED Notes (Signed)
Date and time results received: 08/30/20 0635  Test: troponin Critical Value: 422  Name of Provider Notified: Josph Macho, MD  Orders Received? Or Actions Taken?: acknowledged

## 2020-08-30 NOTE — Progress Notes (Signed)
   Progress Note  Patient Name: Emily [name] Date of Encounter: 08/30/2020  Primary Cardiologist: Minus Breeding, MD  Follow-up high-sensitivity troponin I has increased from [phone], still in the absence of any chest pain or recurrent atrial fibrillation.  Echocardiogram is pending for reevaluation of LVEF.  Please see consultation note from this morning.  Would recommend observation admission today, increase activity and repeat another high-sensitivity troponin I level.  Still most likely this represents demand ischemia with RVR.  Do not need to start heparin.  If rhythm remains stable and she does not report any exertional symptoms with downward trend in high-sensitivity troponin I, she could still likely be discharged tomorrow and we will arrange a follow-up Lexiscan Myoview for next week as an outpatient to ensure no development of ischemic heart disease since her last assessment in 2017.  She does not report any exertional symptoms at baseline.  Signed, Rozann Lesches, MD  08/30/2020, 11:36 AM

## 2020-08-30 NOTE — Consult Note (Addendum)
Cardiology Consultation:   Patient ID: Emily [name] MRN: [id number]; DOB: 1950/12/29  Admit date: 08/30/2020 Date of Consult: 08/30/2020  PCP:  Janora Norlander, [place]  Cardiologist:  Minus Breeding, MD    Patient Profile:   Emily [name] is a 70 y.o. female with past medical history of paroxysmal atrial fibrillation (on Flecainide and s/p Watchman device in 07/2018), HTN, HLD, Hypothyroidism, OSA (on CPAP), carotid artery stenosis (s/p L CEA) and history of GIB who is being seen today for the evaluation of atrial fibrillation with RVR and elevated troponin values at the request of Dr. Dyann Kief.  History of Present Illness:   Emily [name] was last examined by Dr. Percival Spanish in 03/2020 and had recently undergone a repeat echocardiogram which showed a preserved EF of 60 to 65% with no regional wall motion abnormalities. She did have grade 2 diastolic dysfunction, trivial MR and moderate AS. Her Watchman device was being followed by Cardiology at [facility]. She had been in the emergency room the prior month for paroxysmal atrial fibrillation but denied any recurrent palpitations at the time of her visit. Was continued on Flecainide 75 mg twice daily and was recommended that if she had more frequent paroxysms, would increase the dose or switch to Tikosyn.  She presented to [facility] ED on 08/30/2020 for evaluation of palpitations along with nausea, vomiting and diarrhea which had been occurring for almost 12 hours. She was hypotensive upon arrival with BP at 84/34 and was started on IV fluids. She reports being in her usual state of health until lunch time on 3/31 and she developed acute nausea, vomiting and diarrhea. Symptoms persisted throughout the day and she was unable to keep her medications down. Around 2300, she developed palpitations and could tell she was in atrial fibrillation, therefore she came to the ED for further evaluation.   Initial labs  show WBC 14.5, Hgb 12.9, platelets 335, Na+ 138, K+ 3.7, glucose 254 and creatinine 0.69. Lipase 22. COVID negative. Mg 2.0. TSH 0.802. Initial HS Troponin 5 with repeat values of 20 and 422. UA concerning for UTI with culture pending. CXR showing mild vascular congestion. EKG shows atrial fibrillation with RVR, HR 131 and LBBB and ST depression along the lateral leads. Repeat tracing shows NSR and ST depression has significantly improved.   In addition to IVF, she also received IV Cardizem on admission but this has since been discontinued. Has been restarted on Flecainide 72m BID and Cardizem 1250mdaily. Supportive treatment recommended for likely viral gastroenteritis and she has been started on Rocephin for a likely UTI.   Reports feeling back to baseline this morning. No recurrent vomiting or diarrhea. She did convert to NSR around 0345 and denies any recurrent palpitations. No chest pain or dyspnea. Asking when can she go home.     Past Medical History:  Diagnosis Date  . Anemia   . Arthritis   . Atrial fibrillation, rapid ([facility]/29/2015  . Bilateral carotid artery disease ([facility]  . Diabetes mellitus without complication ([facility]  . Difficult intubation    states 'lady that did the sleep study told me I have the smallest airway she has ever seen in an adult"  . Dyslipidemia 06/29/2017  . Dysrhythmia   . Hypertension   . Hypothyroid   . Obesity   . Obstructive sleep apnea    on C Pap  . PAF (paroxysmal atrial fibrillation) ([facility]   a. newly dx  in 09/2013; on eliquis    Past Surgical History:  Procedure Laterality Date  . CATARACT EXTRACTION Left   . CATARACT EXTRACTION W/PHACO Right 12/09/2015   Procedure: CATARACT EXTRACTION PHACO AND INTRAOCULAR LENS PLACEMENT (IOC);  Surgeon: Tonny Branch, MD;  Location: [facility];  Service: Ophthalmology;  Laterality: Right;  CDE:10.48  . COLONOSCOPY W/ POLYPECTOMY    . cyst on back of neck removed    . DILATION AND CURETTAGE OF UTERUS     x 5  .  ENDARTERECTOMY Left 08/30/2015   Procedure: LEFT CAROYID ENDARTERECTOMY WITH XENOSURE BOVINE PERICARDIUM PATCH ANGIOPLASTY;  Surgeon: [name], MD;  Location: [place];  Service: Vascular;  Laterality: Left;  . EYE SURGERY    . TONSILLECTOMY    . UPPER GI ENDOSCOPY       Home Medications:  Prior to Admission medications   Medication Sig Start Date End Date Taking? Authorizing Provider  ACCU-CHEK GUIDE test strip USE TO CHECK BLOOD GLUCOSE ONCE DAILY DX E11.9 12/08/19   Ronnie Doss M, DO  aspirin EC 81 MG tablet Take by mouth. 09/15/18   [provider]  blood glucose meter kit and supplies Dispense based on patient and insurance preference. Use up to four times daily as directed. E11.9 06/28/18   Ronnie Doss M, DO  diltiazem (CARDIZEM CD) 180 MG 24 hr capsule TAKE 1 CAPSULE BY MOUTH EVERY DAY 08/22/20   Minus Breeding, MD  ezetimibe (ZETIA) 10 MG tablet TAKE 1 TABLET BY MOUTH EVERY DAY 02/28/20   Minus Breeding, MD  flecainide (TAMBOCOR) 150 MG tablet TAKE 0.5 TABLETS (75 MG TOTAL) BY MOUTH 2 (TWO) TIMES DAILY. 06/21/20   Minus Breeding, MD  furosemide (LASIX) 20 MG tablet Take 1 tablet (20 mg total) by mouth daily as needed for fluid. 03/08/18 12/26/20  Ronnie Doss M, DO  glipiZIDE (GLUCOTROL) 5 MG tablet TAKE 1 TABLET BY MOUTH TWICE A DAY 07/29/20   Ronnie Doss M, DO  JARDIANCE 25 MG TABS tablet TAKE 1 TABLET BY MOUTH DAILY BEFORE BREAKFAST 08/23/20   Ronnie Doss M, DO  ketoconazole (NIZORAL) 2 % cream Apply 1 application topically daily. Prn intertrigo rash 08/20/20   Janora Norlander, DO  Lancet Device MISC Use to test sugar once daily E11.9 06/28/18   Ronnie Doss M, DO  levothyroxine (SYNTHROID) 112 MCG tablet TAKE 1 TABLET BY MOUTH EVERY OTHER DAY, ALTERNATING WITH 1 & 1/2 TABLETS EVERY OTHER DAY 04/04/20   Ronnie Doss M, DO  losartan (COZAAR) 25 MG tablet TAKE 1 TABLET BY MOUTH EVERY DAY 05/03/20   Minus Breeding, MD  lovastatin (MEVACOR) 40  MG tablet TAKE 1 TABLET BY MOUTH EVERY DAY IN THE EVENING 10/09/19   Minus Breeding, MD  metFORMIN (GLUCOPHAGE) 1000 MG tablet TAKE 1 TABLET (1,000 MG TOTAL) BY MOUTH 2 (TWO) TIMES DAILY WITH A MEAL. 06/24/20   Janora Norlander, DO  metoprolol succinate (TOPROL-XL) 100 MG 24 hr tablet TAKE 1 TABLET (100 MG TOTAL) BY MOUTH DAILY. TAKE WITH OR IMMEDIATELY FOLLOWING A MEAL. 07/29/20   Ronnie Doss M, DO  rOPINIRole (REQUIP) 0.5 MG tablet TAKE 1 TABLET (0.5 MG TOTAL) BY MOUTH AT BEDTIME. 07/29/20   Janora Norlander, DO    Inpatient Medications: Scheduled Meds: . aspirin EC  81 mg Oral Daily  . cyanocobalamin  1,000 mcg Intramuscular Q30 days  . diltiazem  180 mg Oral Daily  . ezetimibe  10 mg Oral Daily  . flecainide  75 mg Oral BID  .  heparin  5,000 Units Subcutaneous Q8H  . insulin aspart  0-15 Units Subcutaneous TID WC  . insulin aspart  0-5 Units Subcutaneous QHS  . levothyroxine  112 mcg Oral Q0600  . metoprolol succinate  100 mg Oral Daily  . pravastatin  10 mg Oral q1800  . rOPINIRole  0.5 mg Oral QHS   Continuous Infusions: . cefTRIAXone (ROCEPHIN)  IV 1 g (08/30/20 0555)   PRN Meds: acetaminophen **OR** acetaminophen, ondansetron **OR** ondansetron (ZOFRAN) IV, oxyCODONE, polyethylene glycol  Allergies:    Allergies  Allergen Reactions  . Quinapril Hcl Cough  . Statins Other (See Comments)    Not all Statins but some cause cough and pain in legs.  . Tape Other (See Comments)    Redness, please use "paper" tape    Social History:   Social History   Socioeconomic History  . Marital status: Single    Spouse name: Not on file  . Number of children: Not on file  . Years of education: Not on file  . Highest education level: Not on file  Occupational History  . Not on file  Tobacco Use  . Smoking status: Never Smoker  . Smokeless tobacco: Never Used  Vaping Use  . Vaping Use: Never used  Substance and Sexual Activity  . Alcohol use: No    Alcohol/week: 0.0  standard drinks  . Drug use: Never  . Sexual activity: Never  Other Topics Concern  . Not on file  Social History Narrative  . Not on file   Social Determinants of Health   Financial Resource Strain: Not on file  Food Insecurity: Not on file  Transportation Needs: Not on file  Physical Activity: Not on file  Stress: Not on file  Social Connections: Not on file  Intimate Partner Violence: Not on file    Family History:    Family History  Problem Relation Age of Onset  . COPD Father   . Heart failure Father   . Heart disease Father   . Emphysema Father   . Arrhythmia Sister   . Arrhythmia Sister   . Arrhythmia Sister        had PPM also  . Cancer Sister      ROS:  Please see the history of present illness.   All other ROS reviewed and negative.     Physical Exam/Data:   Vitals:   08/30/20 0610 08/30/20 0615 08/30/20 0630 08/30/20 0635  BP: (!) 152/59 (!) 142/55 (!) 130/54 (!) 130/51  Pulse: 71 71 70 68  Resp: 18 (!) 22 (!) 22 19  Temp:      TempSrc:      SpO2: 96% 96% 94% 93%  Weight:      Height:        Intake/Output Summary (Last 24 hours) at 08/30/2020 0853 Last data filed at 08/30/2020 0257 Gross per 24 hour  Intake 1000 ml  Output --  Net 1000 ml   Last 3 Weights 08/30/2020 08/20/2020 04/02/2020  Weight (lbs) 221 lb 221 lb 222 lb 14.4 oz  Weight (kg) 100.245 kg 100.245 kg 101.107 kg     Body mass index is 39.78 kg/m.  General:  Well nourished, well developed female appearing in no acute distress. HEENT: normal Lymph: no adenopathy Neck: no JVD. Right carotid bruit.  Endocrine:  No thryomegaly Vascular: No carotid bruits; FA pulses 2+ bilaterally without bruits  Cardiac:  normal S1, S2; RRR; 2/6 SEM along RUSB.  Lungs:  clear to auscultation bilaterally,  no wheezing, rhonchi or rales  Abd: soft, nontender, no hepatomegaly  Ext: no edema Musculoskeletal:  No deformities, BUE and BLE strength normal and equal Skin: warm and dry  Neuro:  CNs 2-12  intact, no focal abnormalities noted Psych:  Normal affect   EKG:  The EKG was personally reviewed and demonstrates: Atrial fibrillation with RVR, HR 131 and LBBB and ST depression along the lateral leads. Repeat tracing shows NSR and ST depression has significantly improved.   Telemetry:  Telemetry was personally reviewed and demonstrates: Atrial fibrillation with HR in 130's to 140's. Converted to NSR around 0345 and has been in NSR since with HR in the 60's to 70's.   Relevant CV Studies:   NST: 07/2015  The left ventricular ejection fraction is hyperdynamic (>65%).  Nuclear stress EF: 66%.  The study is normal.  This is a low risk study.  There was no ST segment deviation noted during stress.  Blood pressure demonstrated a hypertensive response to exercise.   Normal pharmacologic nuclear study with no evidence for infarct or ischemia. There was baseline hypertension with severe hypertension at stress.   Echocardiogram: 10/2019 IMPRESSIONS    1. Left ventricular ejection fraction, by estimation, is 60 to 65%. The  left ventricle has normal function. The left ventricle has no regional  wall motion abnormalities. There is mild concentric left ventricular  hypertrophy. Left ventricular diastolic  parameters are consistent with Grade II diastolic dysfunction  (pseudonormalization). Elevated left ventricular end-diastolic pressure.  2. Right ventricular systolic function is normal. The right ventricular  size is normal.  3. Left atrial size was moderately dilated.  4. The mitral valve is normal in structure. Trivial mitral valve  regurgitation. No evidence of mitral stenosis.  5. The aortic valve is normal in structure. Aortic valve regurgitation is  not visualized. Moderate aortic valve stenosis. Aortic valve mean gradient  measures 26.0 mmHg. Aortic valve Vmax measures 3.06 m/s.  6. The inferior vena cava is dilated in size with >50% respiratory  variability,  suggesting right atrial pressure of 8 mmHg.   Carotid Dopplers: 04/2020 Summary:  Right Carotid: Velocities in the right ICA are consistent with a 60-79%         stenosis. The ECA appears >50% stenosed.   Left Carotid: Velocities in the left ICA are consistent with a 1-39%  stenosis.        The ECA appears >50% stenosed.   Laboratory Data:  High Sensitivity Troponin:   Recent Labs  Lab 08/30/20 0038 08/30/20 0254 08/30/20 0534  TROPONINIHS 5 20* 422*     Chemistry Recent Labs  Lab 08/30/20 0038 08/30/20 0534  NA 138 140  K 3.7 3.9  CL 105 105  CO2 20* 22  GLUCOSE 254* 193*  BUN 36* 34*  CREATININE 0.69 0.61  CALCIUM 8.5* 8.3*  GFRNONAA >60 >60  ANIONGAP 13 13    Recent Labs  Lab 08/30/20 0038 08/30/20 0534  PROT 7.6 7.2  ALBUMIN 4.1 3.9  AST 23 31  ALT 22 24  ALKPHOS 96 93  BILITOT 1.8* 1.4*   Hematology Recent Labs  Lab 08/30/20 0038 08/30/20 0534  WBC 14.5* 9.5  RBC 4.35 4.13  HGB 12.9 12.2  HCT 40.8 39.5  MCV 93.8 95.6  MCH 29.7 29.5  MCHC 31.6 30.9  RDW 15.9* 15.8*  PLT 335 215   BNPNo results for input(s): BNP, PROBNP in the last 168 hours.  DDimer No results for input(s): DDIMER in the last 168  hours.   Radiology/Studies:  DG CHEST PORT 1 VIEW  Result Date: 08/30/2020 CLINICAL DATA:  Atrial fibrillation EXAM: PORTABLE CHEST 1 VIEW COMPARISON:  02/04/2020 FINDINGS: Cardiac shadow is stable. Aortic calcifications are again seen. Lungs are well aerated bilaterally. Mild central vascular congestion is noted similar to that seen on prior exam. Watchman device is noted within the left atrial appendage. No bony abnormality is seen. IMPRESSION: Mild vascular congestion stable in appearance from the prior exam. Electronically Signed   By: Inez Catalina M.D.   On: 08/30/2020 03:43     Assessment and Plan:   1. Atrial Fibrillation with RVR - Occurred in the setting of a likely viral GI illness resulting in dehydration along with a  UTI present on admission. She was started on IV Cardizem and has since converted back to NSR. Would stop IV Cardizem and restart her home medications of Cardizem CD 142m daily, Toprol-XL 1061mdaily and Flecainide 7532mID now that her BP has normalized.  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 Elevated Troponin Values -  Initial HS Troponin 5 with repeat values of 20 and 422. Suspect this is secondary to demand ischemia in the setting of atrial fibrillation with RVR and not consistent with ACS. She did have ST depression when in atrial fibrillation but this has improved since back in NSR. Continue to trend enzymes and will plan to obtain an echocardiogram for reassessment of LV function and wall motion. Would not start Heparin unless enzymes continue to trend upwards given her history of GIB.   - Reviewed with Dr. McDDomenic Polited given her enzyme elevation and that she is on Flecainide, [facility] to rule-out any significant ischemia (last stress test was in 2017).   3. Carotid Artery Stenosis - She is s/p L CEA and has known right carotid stenosis which is followed by Vascular as an outpatient. Continue ASA 98m60mily, Lovastatin 40mg34mly and Zetia 10mg 97my.   4. Aortic Stenosis - This was moderate by echocardiogram in 10/2019. Repeat imaging pending.   5. HTN - She was initially hypotensive in the setting of dehydration but this improved with administration of IVF. Will plan to restart her PTA Toprol-XL and Cardizem as outlined above. Will hold Losartan for now until we can see how her BP responds.   6. Viral Gastroenteritis/UTI - She has been started on Rocephin for her UTI. Further management per the admitting team.    Risk Assessment/Risk Scores:       CHA2DS2-VASc Score = 5  This indicates a 7.2% annual risk  of stroke. The patient's score is based upon: CHF History: No HTN History: Yes Diabetes History: Yes Stroke History: No Vascular Disease History: Yes Age Score: 1 Gender Score: 1   For questions or updates, please contact [facility] consult [email or web address] for contact info under    Signed, BrittaErma Heritage  08/30/2020 8:53 AM   Attending note:  Patient seen and examined.  I reviewed her records and discussed the case with Ms. StradeDelano Metzree with her above findings.  Ms. HutcheChismarnts with a breakthrough episode of atrial fibrillation associated with RVR in the setting of UTI, nausea/vomiting, and diarrhea within the last 12 hours.  She was not able to take her regular oral medications  including flecainide during this time.  She is not anticoagulated given prior history of GI bleed, has had a Watchman device placed at [facility].  She was given IV fluids at presentation, also on intravenous Cardizem, spontaneously converted to sinus rhythm whether she remains this morning.  She has had no chest pain, high-sensitivity troponin I levels were checked at 20 and subsequently 422.  ECG shows sinus rhythm with IVCD of left bundle branch block type (old).  On examination this morning she appears comfortable.  Afebrile, heart rate in the 60s to 70s in sinus rhythm by telemetry which I personally reviewed.  Systolic blood pressure 923R to 130s.  Lungs are clear.  Cardiac exam with RRR no gallop, soft systolic murmur.  Pertinent lab work includes potassium 3.9, creatinine 0.61, normal AST and ALT, hemoglobin 12.2, platelets 215, TSH 0.80.  Chest x-ray reports mild vascular congestion.  Known history of paroxysmal atrial fibrillation, CHA2DS2-VASc score is 5 but she is not anticoagulated with prior history of GI bleed and status post Watchman device at [facility].  She presented with RVR as noted above but has spontaneously converted to sinus rhythm.  She is on flecainide, Toprol-XL  and Cardizem CD as an outpatient.  Would continue present cardiac regimen, check echocardiogram to ensure no change in LVEF and follow-up high-sensitivity troponin I levels.  If LVEF is normal and she does not have a diagnostic trend in troponin, she can likely be discharged later today.  Would recommend a follow-up outpatient Lexiscan Myoview however to rescreen for ischemic heart disease, her last test was in 2017.  This will be important if flecainide is to be continued longer term.  Satira Sark, M.D., F.A.C.C.

## 2020-08-30 NOTE — H&P (Signed)
TRH H&P    Patient Demographics:    Emily [name], is a 70 y.o. female  MRN: [id number]  DOB - Jul 06, 1950  Admit Date - 08/30/2020  Referring MD/NP/PA: Stark Jock  Outpatient Primary MD for the patient is Janora Norlander, DO  Patient coming from: Home  Chief complaint-palpitations   HPI:    Emily [name]  is a 70 y.o. female, with history of paroxysmal atrial fibrillation, obstructive sleep apnea, obesity, hypothyroidism, hypertension, hyperlipidemia, diabetes mellitus type 2, and more presents the ED with a chief complaint of palpitations.  Patient reports that on March 31 between 1 and 2 PM she started having vomiting and diarrhea.  She reports the release 5 or 6 episodes of vomiting at least 5 or 6 episodes of diarrhea.  Some episodes included both at the same time.  This continued between 1 or 2 PM and 11 PM.  She reports no hematemesis, hematochezia, melena.  She reports that she had all of her stomach pain that was like an ache when this was going on.  Vomiting or having a bowel movement would make the pain a little better.  Pain is completely resolved now.  Patient denies any fever.  She reports that she was otherwise in her normal state of health and even worked on the morning of March 31.  That evening she started having palpitations.  She reports no actual chest pain but back and neck pain.  She had taken her 75 mg of flecainide twice as she was supposed to the day.  When the palpitations did not go away she came into the ED.  She was hoping to be cardioverted, especially since her watchman device sinus exactly how long she has been in A. fib, but she was not cardioverted in the ED because she was not on anticoagulation.  Patient did have soft blood pressures, but they improved with fluids.  In the ED Temp 97.8, heart rate 55-1 41, respiratory rate 14-36, blood pressure 108/53, satting 93% on room  air Leukocytosis 14.5, hemoglobin 12.9, platelets 335 Chemistry panel reveals a decreased bicarb at 20, hyperglycemia at 254 Troponin is uptrending from 5-20 Last TSH earlier this month was 1.33, T4 1.45 EKG showed a heart rate of 131 A. fib with RVR and aberrancy 1 L NaCl was given, diltiazem drip was started Admission was requested for further management of A. fib with RVR    Review of systems:    In addition to the HPI above,  No Fever-chills, No Headache, No changes with Vision or hearing, No problems swallowing food or Liquids, No Chest pain, Cough or Shortness of Breath, Admits to abdominal pain, nausea, vomiting, diarrhea No Blood in stool or Urine, No dysuria, No new skin rashes or bruises, Admits to neck and back pain No new weakness, tingling, numbness in any extremity, No recent weight gain or loss, No polyuria, polydypsia or polyphagia, No significant Mental Stressors.  All other systems reviewed and are negative.    Past History of the following :    Past Medical History:  Diagnosis Date  . Anemia   . Arthritis   . Atrial fibrillation, rapid ([place]) 10/27/2013  . Bilateral carotid artery disease ([place])   . Diabetes mellitus without complication ([place])   . Difficult intubation    states 'lady that did the sleep study told me I have the smallest airway she has ever seen in an adult"  . Dyslipidemia 06/29/2017  . Dysrhythmia   . Hypertension   . Hypothyroid   . Obesity   . Obstructive sleep apnea    on C Pap  . PAF (paroxysmal atrial fibrillation) ([place])    a. newly dx in 09/2013; on eliquis      Past Surgical History:  Procedure Laterality Date  . CATARACT EXTRACTION Left   . CATARACT EXTRACTION W/PHACO Right 12/09/2015   Procedure: CATARACT EXTRACTION PHACO AND INTRAOCULAR LENS PLACEMENT (IOC);  Surgeon: Tonny Branch, MD;  Location: [facility];  Service: Ophthalmology;  Laterality: Right;  CDE:10.48  . COLONOSCOPY W/ POLYPECTOMY    . cyst on back of neck  removed    . DILATION AND CURETTAGE OF UTERUS     x 5  . ENDARTERECTOMY Left 08/30/2015   Procedure: LEFT CAROYID ENDARTERECTOMY WITH XENOSURE BOVINE PERICARDIUM PATCH ANGIOPLASTY;  Surgeon: [name], MD;  Location: [facility];  Service: Vascular;  Laterality: Left;  . EYE SURGERY    . TONSILLECTOMY    . UPPER GI ENDOSCOPY        Social History:      Social History   Tobacco Use  . Smoking status: Never Smoker  . Smokeless tobacco: Never Used  Substance Use Topics  . Alcohol use: No    Alcohol/week: 0.0 standard drinks       Family History :     Family History  Problem Relation Age of Onset  . COPD Father   . Heart failure Father   . Heart disease Father   . Emphysema Father   . Arrhythmia Sister   . Arrhythmia Sister   . Arrhythmia Sister        had PPM also  . Cancer Sister   3   Home Medications:   Prior to Admission medications   Medication Sig Start Date End Date Taking? Authorizing Provider  ACCU-CHEK GUIDE test strip USE TO CHECK BLOOD GLUCOSE ONCE DAILY DX E11.9 12/08/19   Ronnie Doss M, DO  aspirin EC 81 MG tablet Take by mouth. 09/15/18   [provider]  blood glucose meter kit and supplies Dispense based on patient and insurance preference. Use up to four times daily as directed. E11.9 06/28/18   Ronnie Doss M, DO  diltiazem (CARDIZEM CD) 180 MG 24 hr capsule TAKE 1 CAPSULE BY MOUTH EVERY DAY 08/22/20   Minus Breeding, MD  ezetimibe (ZETIA) 10 MG tablet TAKE 1 TABLET BY MOUTH EVERY DAY 02/28/20   Minus Breeding, MD  flecainide (TAMBOCOR) 150 MG tablet TAKE 0.5 TABLETS (75 MG TOTAL) BY MOUTH 2 (TWO) TIMES DAILY. 06/21/20   Minus Breeding, MD  furosemide (LASIX) 20 MG tablet Take 1 tablet (20 mg total) by mouth daily as needed for fluid. 03/08/18 12/26/20  Ronnie Doss M, DO  glipiZIDE (GLUCOTROL) 5 MG tablet TAKE 1 TABLET BY MOUTH TWICE A DAY 07/29/20   Ronnie Doss M, DO  JARDIANCE 25 MG TABS tablet TAKE 1 TABLET BY MOUTH  DAILY BEFORE BREAKFAST 08/23/20   Ronnie Doss M, DO  ketoconazole (NIZORAL) 2 % cream Apply 1 application topically daily. Prn intertrigo rash 08/20/20  Ronnie Doss M, DO  Lancet Device MISC Use to test sugar once daily E11.9 06/28/18   Ronnie Doss M, DO  levothyroxine (SYNTHROID) 112 MCG tablet TAKE 1 TABLET BY MOUTH EVERY OTHER DAY, ALTERNATING WITH 1 & 1/2 TABLETS EVERY OTHER DAY 04/04/20   Ronnie Doss M, DO  losartan (COZAAR) 25 MG tablet TAKE 1 TABLET BY MOUTH EVERY DAY 05/03/20   Minus Breeding, MD  lovastatin (MEVACOR) 40 MG tablet TAKE 1 TABLET BY MOUTH EVERY DAY IN THE EVENING 10/09/19   Minus Breeding, MD  metFORMIN (GLUCOPHAGE) 1000 MG tablet TAKE 1 TABLET (1,000 MG TOTAL) BY MOUTH 2 (TWO) TIMES DAILY WITH A MEAL. 06/24/20   Janora Norlander, DO  metoprolol succinate (TOPROL-XL) 100 MG 24 hr tablet TAKE 1 TABLET (100 MG TOTAL) BY MOUTH DAILY. TAKE WITH OR IMMEDIATELY FOLLOWING A MEAL. 07/29/20   Ronnie Doss M, DO  rOPINIRole (REQUIP) 0.5 MG tablet TAKE 1 TABLET (0.5 MG TOTAL) BY MOUTH AT BEDTIME. 07/29/20   Janora Norlander, DO     Allergies:     Allergies  Allergen Reactions  . Quinapril Hcl Cough  . Statins Other (See Comments)    Not all Statins but some cause cough and pain in legs.  . Tape Other (See Comments)    Redness, please use "paper" tape     Physical Exam:   Vitals  Blood pressure 138/69, pulse 77, temperature 97.8 F (36.6 C), temperature source Rectal, resp. rate (!) 22, height 5' 2.5" (1.588 m), weight 100.2 kg, SpO2 96 %.  1.  General: Patient lying supine in bed in no acute distress  2. Psychiatric: Mood and behavior normal for situation, good insight into healthcare, pleasant, cooperative with exam  3. Neurologic: Face is symmetric, speech and language are normal, moves all 4 extremities voluntarily, equal sensation upper and lower extremities bilaterally, no focal deficit on limited exam  4. HEENMT:  Head is  atraumatic, normocephalic, pupils reactive to light, neck is supple, trachea is midline, mucous membranes are moist  5. Respiratory : Mild wheeze in the left lung field, no rhonchi, no crackles, no clubbing, no increased work of breathing, no accessory muscle use  6. Cardiovascular : Heart rate is tachycardic, rhythm is irregularly irregular, no murmurs rubs or gallops, no peripheral edema  7. Gastrointestinal:  Abdomen is soft, nondistended, nontender to palpation  8. Skin:  Skin is warm dry and intact without acute lesion on limited exam  9.Musculoskeletal:  No acute deformity, no calf tenderness, peripheral pulses palpated    Data Review:    CBC Recent Labs  Lab 08/30/20 0038  WBC 14.5*  HGB 12.9  HCT 40.8  PLT 335  MCV 93.8  MCH 29.7  MCHC 31.6  RDW 15.9*  LYMPHSABS 0.4*  MONOABS 0.4  EOSABS 0.0  BASOSABS 0.1   ------------------------------------------------------------------------------------------------------------------  Results for orders placed or performed during the hospital encounter of 08/30/20 (from the past 48 hour(s))  CBC with Differential     Status: Abnormal   Collection Time: 08/30/20 12:38 AM  Result Value Ref Range   WBC 14.5 (H) 4.0 - 10.5 K/uL   RBC 4.35 3.87 - 5.11 MIL/uL   Hemoglobin 12.9 12.0 - 15.0 g/dL   HCT 40.8 36.0 - 46.0 %   MCV 93.8 80.0 - 100.0 fL   MCH 29.7 26.0 - 34.0 pg   MCHC 31.6 30.0 - 36.0 g/dL   RDW 15.9 (H) 11.5 - 15.5 %   Platelets 335 150 - 400  K/uL   nRBC 0.0 0.0 - 0.2 %   Neutrophils Relative % 95 %   Neutro Abs 13.7 (H) 1.7 - 7.7 K/uL   Lymphocytes Relative 2 %   Lymphs Abs 0.4 (L) 0.7 - 4.0 K/uL   Monocytes Relative 2 %   Monocytes Absolute 0.4 0.1 - 1.0 K/uL   Eosinophils Relative 0 %   Eosinophils Absolute 0.0 0.0 - 0.5 K/uL   Basophils Relative 0 %   Basophils Absolute 0.1 0.0 - 0.1 K/uL   Immature Granulocytes 1 %   Abs Immature Granulocytes 0.10 (H) 0.00 - 0.07 K/uL    Comment: Performed at [facility], [address], [place], [place] [address]  Comprehensive metabolic panel     Status: Abnormal   Collection Time: 08/30/20 12:38 AM  Result Value Ref Range   Sodium 138 135 - 145 mmol/L   Potassium 3.7 3.5 - 5.1 mmol/L   Chloride 105 98 - 111 mmol/L   CO2 20 (L) 22 - 32 mmol/L   Glucose, Bld 254 (H) 70 - 99 mg/dL    Comment: Glucose reference range applies only to samples taken after fasting for at least 8 hours.   BUN 36 (H) 8 - 23 mg/dL   Creatinine, Ser 0.69 0.44 - 1.00 mg/dL   Calcium 8.5 (L) 8.9 - 10.3 mg/dL   Total Protein 7.6 6.5 - 8.1 g/dL   Albumin 4.1 3.5 - 5.0 g/dL   AST 23 15 - 41 U/L   ALT 22 0 - 44 U/L   Alkaline Phosphatase 96 38 - 126 U/L   Total Bilirubin 1.8 (H) 0.3 - 1.2 mg/dL   GFR, Estimated >60 >60 mL/min    Comment: (NOTE) Calculated using the CKD-EPI Creatinine Equation (2021)    Anion gap 13 5 - 15    Comment: Performed at [facility], [address], [place], [place] [address]  Troponin I (High Sensitivity)     Status: None   Collection Time: 08/30/20 12:38 AM  Result Value Ref Range   Troponin I (High Sensitivity) 5 <18 ng/L    Comment: (NOTE) Elevated high sensitivity troponin I (hsTnI) values and significant  changes across serial measurements may suggest ACS but many other  chronic and acute conditions are known to elevate hsTnI results.  Refer to the "Links" section for chest pain algorithms and additional  guidance. Performed at [facility], [address] [place], [place] [address]   Lipase, blood     Status: None   Collection Time: 08/30/20 12:38 AM  Result Value Ref Range   Lipase 22 11 - 51 U/L    Comment: Performed at [facility], [address], [place], [place] [address]  Resp Panel by RT-PCR (Flu A&B, Covid) Nasopharyngeal Swab     Status: None   Collection Time: 08/30/20  2:50 AM   Specimen: Nasopharyngeal Swab; Nasopharyngeal(NP) swabs in vial transport medium  Result Value Ref Range   SARS Coronavirus 2 by RT PCR  NEGATIVE NEGATIVE    Comment: (NOTE) SARS-CoV-2 target nucleic acids are NOT DETECTED.  The SARS-CoV-2 RNA is generally detectable in upper respiratory specimens during the acute phase of infection. The lowest concentration of SARS-CoV-2 viral copies this assay can detect is 138 copies/mL. A negative result does not preclude SARS-Cov-2 infection and should not be used as the sole basis for treatment or other patient management decisions. A negative result may occur with  improper specimen collection/handling, submission of specimen other than nasopharyngeal swab, presence  of viral mutation(s) within the areas targeted by this assay, and inadequate number of viral copies(<138 copies/mL). A negative result must be combined with clinical observations, patient history, and epidemiological information. The expected result is Negative.  Fact Sheet for Patients:  EntrepreneurPulse.com.au  Fact Sheet for Healthcare Providers:  IncredibleEmployment.be  This test is no t yet approved or cleared by the [place] FDA and  has been authorized for detection and/or diagnosis of SARS-CoV-2 by FDA under an Emergency Use Authorization (EUA). This EUA will remain  in effect (meaning this test can be used) for the duration of the COVID-19 declaration under Section 564(b)(1) of the Act, 21 U.S.C.section 360bbb-3(b)(1), unless the authorization is terminated  or revoked sooner.       Influenza A by PCR NEGATIVE NEGATIVE   Influenza B by PCR NEGATIVE NEGATIVE    Comment: (NOTE) The Xpert Xpress SARS-CoV-2/FLU/RSV plus assay is intended as an aid in the diagnosis of influenza from Nasopharyngeal swab specimens and should not be used as a sole basis for treatment. Nasal washings and aspirates are unacceptable for Xpert Xpress SARS-CoV-2/FLU/RSV testing.  Fact Sheet for Patients: EntrepreneurPulse.com.au  Fact Sheet for Healthcare  Providers: IncredibleEmployment.be  This test is not yet approved or cleared by the [place] FDA and has been authorized for detection and/or diagnosis of SARS-CoV-2 by FDA under an Emergency Use Authorization (EUA). This EUA will remain in effect (meaning this test can be used) for the duration of the COVID-19 declaration under Section 564(b)(1) of the Act, 21 U.S.C. section 360bbb-3(b)(1), unless the authorization is terminated or revoked.  Performed at [facility], [address] [place], [place] [address]   Troponin I (High Sensitivity)     Status: Abnormal   Collection Time: 08/30/20  2:54 AM  Result Value Ref Range   Troponin I (High Sensitivity) 20 (H) <18 ng/L    Comment: (NOTE) Elevated high sensitivity troponin I (hsTnI) values and significant  changes across serial measurements may suggest ACS but many other  chronic and acute conditions are known to elevate hsTnI results.  Refer to the "Links" section for chest pain algorithms and additional  guidance. Performed at [facility], [address], [place], [place] [address]   Urinalysis, Complete w Microscopic     Status: Abnormal   Collection Time: 08/30/20  4:34 AM  Result Value Ref Range   Color, Urine YELLOW YELLOW   APPearance HAZY (A) CLEAR   Specific Gravity, Urine 1.030 1.005 - 1.030   pH 5.0 5.0 - 8.0   Glucose, UA >=500 (A) NEGATIVE mg/dL   Hgb urine dipstick NEGATIVE NEGATIVE   Bilirubin Urine NEGATIVE NEGATIVE   Ketones, ur 80 (A) NEGATIVE mg/dL   Protein, ur NEGATIVE NEGATIVE mg/dL   Nitrite POSITIVE (A) NEGATIVE   Leukocytes,Ua MODERATE (A) NEGATIVE   RBC / HPF 0-5 0 - 5 RBC/hpf   WBC, UA >50 (H) 0 - 5 WBC/hpf   Bacteria, UA RARE (A) NONE SEEN   Squamous Epithelial / LPF 0-5 0 - 5   Mucus PRESENT    Hyaline Casts, UA PRESENT     Comment: Performed at [facility], [address] [place], [place] [address]    Chemistries  Recent Labs  Lab 08/30/20 0038  NA 138  K 3.7   CL 105  CO2 20*  GLUCOSE 254*  BUN 36*  CREATININE 0.69  CALCIUM 8.5*  AST 23  ALT 22  ALKPHOS 96  BILITOT 1.8*   ------------------------------------------------------------------------------------------------------------------  ------------------------------------------------------------------------------------------------------------------ GFR:  Estimated Creatinine Clearance: 73.2 mL/min (by C-G formula based on SCr of 0.69 mg/dL). Liver Function Tests: Recent Labs  Lab 08/30/20 0038  AST 23  ALT 22  ALKPHOS 96  BILITOT 1.8*  PROT 7.6  ALBUMIN 4.1   Recent Labs  Lab 08/30/20 0038  LIPASE 22   No results for input(s): AMMONIA in the last 168 hours. Coagulation Profile: No results for input(s): INR, PROTIME in the last 168 hours. Cardiac Enzymes: No results for input(s): CKTOTAL, CKMB, CKMBINDEX, TROPONINI in the last 168 hours. BNP (last 3 results) No results for input(s): PROBNP in the last 8760 hours. HbA1C: No results for input(s): HGBA1C in the last 72 hours. CBG: No results for input(s): GLUCAP in the last 168 hours. Lipid Profile: No results for input(s): CHOL, HDL, LDLCALC, TRIG, CHOLHDL, LDLDIRECT in the last 72 hours. Thyroid Function Tests: No results for input(s): TSH, T4TOTAL, FREET4, T3FREE, THYROIDAB in the last 72 hours. Anemia Panel: No results for input(s): VITAMINB12, FOLATE, FERRITIN, TIBC, IRON, RETICCTPCT in the last 72 hours.  --------------------------------------------------------------------------------------------------------------- Urine analysis:    Component Value Date/Time   COLORURINE YELLOW 08/30/2020 0434   APPEARANCEUR HAZY (A) 08/30/2020 0434   APPEARANCEUR Cloudy (A) 09/10/2017 1322   LABSPEC 1.030 08/30/2020 0434   PHURINE 5.0 08/30/2020 0434   GLUCOSEU >=500 (A) 08/30/2020 0434   HGBUR NEGATIVE 08/30/2020 0434   BILIRUBINUR NEGATIVE 08/30/2020 0434   BILIRUBINUR Negative 09/10/2017 1322   KETONESUR 80 (A)  08/30/2020 0434   PROTEINUR NEGATIVE 08/30/2020 0434   UROBILINOGEN 0.2 05/26/2014 1611   NITRITE POSITIVE (A) 08/30/2020 0434   LEUKOCYTESUR MODERATE (A) 08/30/2020 0434      Imaging Results:    DG CHEST PORT 1 VIEW  Result Date: 08/30/2020 CLINICAL DATA:  Atrial fibrillation EXAM: PORTABLE CHEST 1 VIEW COMPARISON:  02/04/2020 FINDINGS: Cardiac shadow is stable. Aortic calcifications are again seen. Lungs are well aerated bilaterally. Mild central vascular congestion is noted similar to that seen on prior exam. Watchman device is noted within the left atrial appendage. No bony abnormality is seen. IMPRESSION: Mild vascular congestion stable in appearance from the prior exam. Electronically Signed   By: Inez Catalina M.D.   On: 08/30/2020 03:43       Assessment & Plan:    Active Problems:   Atrial fibrillation with RVR (HCC)   1. A. fib with RVR 1. History of paroxysmal A. fib and watchman's device 2. Blood pressures were soft initially, improved with fluids 3. Patient spontaneously converted after admission captured on EKG 4. Infection is likely what caused her to go into A. fib with RVR, UA is indicative of UTI, chest x-ray is stable, patient discusses symptoms consistent with viral gastroenteritis 5. Continue to monitor 2. Hyperglycemia in the setting of diabetes mellitus type 2 1. Usually controlled with a hemoglobin A1c of 6.2 2. Hyperglycemia of 254 is likely secondary to infection 3. Sliding scale coverage 4. Continue to monitor 3. UTI 1. Urine culture pending 2. Continue Rocephin 3. This is likely the etiology of the leukocytosis 14.5 4. Continue to monitor 4. Elevated troponin 1. First troponin normal, second 1 mildly elevated, most likely secondary to demand ischemia 2. Trend 1 more time   DVT Prophylaxis-   Heparin- SCDs   AM Labs Ordered, also please review Full Orders  Family Communication: No family at bedside  Code Status: Full  Admission status:  Inpatient :The appropriate admission status for this patient is INPATIENT. Inpatient status is judged to be reasonable and  necessary in order to provide the required intensity of service to ensure the patient's safety. The patient's presenting symptoms, physical exam findings, and initial radiographic and laboratory data in the context of their chronic comorbidities is felt to place them at high risk for further clinical deterioration. Furthermore, it is not anticipated that the patient will be medically stable for discharge from the hospital within 2 midnights of admission. The following factors support the admission status of inpatient.     The patient's presenting symptoms include palpitations. The worrisome physical exam findings include irregularly irregular heartbeat The initial radiographic and laboratory data are worrisome because of UTI The chronic co-morbidities include hypothyroid, hypertension, hyperlipidemia, diabetes mellitus type 2,       * I certify that at the point of admission it is my clinical judgment that the patient will require inpatient hospital care spanning beyond 2 midnights from the point of admission due to high intensity of service, high risk for further deterioration and high frequency of surveillance required.*  Time spent in minutes : [place]

## 2020-08-30 NOTE — Progress Notes (Signed)
Patient seen and examined.  Admitted after midnight secondary to chest pain and A. fib with RVR.  At the time of my evaluation patient was chest pain-free, with excellent response to the use of IV Cardizem; right was controlling the 60s to 70s and essentially asymptomatic.  Patient cardiac enzymes steadily trending up and with request for observation admission.  Please refer to H&P written by Dr. Clearence Ped for further info/details on admission history.    Plan: -Continue to follow/cycle troponin enzymes -No acute ischemic changes on EKG and currently chest pain-free; patient also denies shortness of breath. -Most likely demand ischemia -Advised by cardiology service no need for heparin drip currently. -Follow-up echo results. -Hopefully discharge in a.m. if otherwise remains stable.  Barton Dubois MD [phone]

## 2020-08-30 NOTE — ED Provider Notes (Signed)
[facility] EMERGENCY DEPARTMENT Provider Note   CSN: [id number] Arrival date & time: 08/30/20  0009     History Chief Complaint  Patient presents with  . A. Fib RVR    Emily [name] is a 70 y.o. female.  Patient is a 70 year old female with history of paroxysmal A. fib with prior watchman implantation, hypertension, diabetes.  Patient presents today for evaluation of rapid heartbeat, weakness, and possible dehydration.  Patient started earlier today with what she describes as a stomach virus.  She reports 5 episodes each of diarrhea and vomiting.  All has been nonbloody and nonbilious.  She reports little p.o. intake.  This evening she became lightheaded and began having palpitations and EMS was called.  She was found to be in A. fib with RVR, hypotensive, and fluid bolus initiated.  Patient denies to me she is having any chest pain or shortness of breath.  She does describe pain across the back of her neck which she tells me she normally gets when she is in A. fib.  The history is provided by the patient.       Past Medical History:  Diagnosis Date  . Anemia   . Arthritis   . Atrial fibrillation, rapid ([place]) 10/27/2013  . Bilateral carotid artery disease ([place])   . Diabetes mellitus without complication ([place])   . Difficult intubation    states 'lady that did the sleep study told me I have the smallest airway she has ever seen in an adult"  . Dyslipidemia 06/29/2017  . Dysrhythmia   . Hypertension   . Hypothyroid   . Obesity   . Obstructive sleep apnea    on C Pap  . PAF (paroxysmal atrial fibrillation) ([place])    a. newly dx in 09/2013; on eliquis    Patient Active Problem List   Diagnosis Date Noted  . Nonrheumatic aortic valve stenosis 03/19/2020  . Persistent atrial fibrillation ([place]) 01/16/2020  . Bilateral carotid artery stenosis 10/12/2019  . Educated about COVID-19 virus infection 10/12/2019  . Acute on chronic blood loss anemia 09/25/2019  . OSA on CPAP  09/25/2019  . Upper GI bleed 09/25/2019  . History of adenomatous polyp of colon 08/30/2019  . 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 device 07/16/2018  . Chronic anticoagulation 03/03/2018  . Melena 03/03/2018  . Restless leg syndrome 02/16/2018  . Chronic left-sided low back pain with left-sided sciatica 02/16/2018  . Morbid obesity ([place]) 02/14/2018  . Bruit 02/14/2018  . PAF (paroxysmal atrial fibrillation) ([place]) 02/14/2018  . DDD (degenerative disc disease), lumbar 12/07/2017  . Peripheral arterial disease ([place]) 07/06/2017  . Hyperlipidemia associated with type 2 diabetes mellitus ([place]) 06/29/2017  . Iron deficiency anemia 05/27/2017  . Vitamin B12 deficiency 05/27/2017  . Pain in joint, ankle and foot 06/03/2016  . Carotid stenosis 08/30/2015  . Bilateral carotid artery disease ([place]) 07/05/2015  . Acute bronchitis 01/31/2015  . Multiple gastric polyps 07/03/2014  . Diabetes mellitus without complication ([place])   . Hypertension associated with diabetes ([place]) 10/27/2013  . Hypothyroidism due to acquired atrophy of thyroid 10/27/2013  . Hypothyroidism 06/06/2010  . Nonspecific elevation of levels of transaminase or lactic acid dehydrogenase (LDH) 02/14/2007  . 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 Cellulitis and abscess of finger, unspecified 01/17/2007    Past Surgical History:  Procedure Laterality Date  . CATARACT EXTRACTION Left   . CATARACT EXTRACTION W/PHACO Right 12/09/2015   Procedure: CATARACT EXTRACTION PHACO AND INTRAOCULAR LENS PLACEMENT (IOC);  Surgeon:  Tonny Branch, MD;  Location: [facility];  Service: Ophthalmology;  Laterality: Right;  CDE:10.48  . COLONOSCOPY W/ POLYPECTOMY    . cyst on back of neck removed    . DILATION AND CURETTAGE OF UTERUS     x 5  . ENDARTERECTOMY Left 08/30/2015   Procedure: LEFT CAROYID ENDARTERECTOMY WITH XENOSURE BOVINE PERICARDIUM PATCH ANGIOPLASTY;  Surgeon: [name], MD;  Location: [facility];  Service: Vascular;   Laterality: Left;  . EYE SURGERY    . TONSILLECTOMY    . UPPER GI ENDOSCOPY       OB History   No obstetric history on file.     Family History  Problem Relation Age of Onset  . COPD Father   . Heart failure Father   . Heart disease Father   . Emphysema Father   . Arrhythmia Sister   . Arrhythmia Sister   . Arrhythmia Sister        had PPM also  . Cancer Sister     Social History   Tobacco Use  . Smoking status: Never Smoker  . Smokeless tobacco: Never Used  Vaping Use  . Vaping Use: Never used  Substance Use Topics  . Alcohol use: No    Alcohol/week: 0.0 standard drinks  . Drug use: Never    Home Medications Prior to Admission medications   Medication Sig Start Date End Date Taking? Authorizing Provider  ACCU-CHEK GUIDE test strip USE TO CHECK BLOOD GLUCOSE ONCE DAILY DX E11.9 12/08/19   Ronnie Doss M, DO  aspirin EC 81 MG tablet Take by mouth. 09/15/18   [provider]  blood glucose meter kit and supplies Dispense based on patient and insurance preference. Use up to four times daily as directed. E11.9 06/28/18   Ronnie Doss M, DO  diltiazem (CARDIZEM CD) 180 MG 24 hr capsule TAKE 1 CAPSULE BY MOUTH EVERY DAY 08/22/20   Minus Breeding, MD  ezetimibe (ZETIA) 10 MG tablet TAKE 1 TABLET BY MOUTH EVERY DAY 02/28/20   Minus Breeding, MD  flecainide (TAMBOCOR) 150 MG tablet TAKE 0.5 TABLETS (75 MG TOTAL) BY MOUTH 2 (TWO) TIMES DAILY. 06/21/20   Minus Breeding, MD  furosemide (LASIX) 20 MG tablet Take 1 tablet (20 mg total) by mouth daily as needed for fluid. 03/08/18 12/26/20  Ronnie Doss M, DO  glipiZIDE (GLUCOTROL) 5 MG tablet TAKE 1 TABLET BY MOUTH TWICE A DAY 07/29/20   Ronnie Doss M, DO  JARDIANCE 25 MG TABS tablet TAKE 1 TABLET BY MOUTH DAILY BEFORE BREAKFAST 08/23/20   Ronnie Doss M, DO  ketoconazole (NIZORAL) 2 % cream Apply 1 application topically daily. Prn intertrigo rash 08/20/20   Janora Norlander, DO  Lancet Device MISC Use  to test sugar once daily E11.9 06/28/18   Ronnie Doss M, DO  levothyroxine (SYNTHROID) 112 MCG tablet TAKE 1 TABLET BY MOUTH EVERY OTHER DAY, ALTERNATING WITH 1 & 1/2 TABLETS EVERY OTHER DAY 04/04/20   Ronnie Doss M, DO  losartan (COZAAR) 25 MG tablet TAKE 1 TABLET BY MOUTH EVERY DAY 05/03/20   Minus Breeding, MD  lovastatin (MEVACOR) 40 MG tablet TAKE 1 TABLET BY MOUTH EVERY DAY IN THE EVENING 10/09/19   Minus Breeding, MD  metFORMIN (GLUCOPHAGE) 1000 MG tablet TAKE 1 TABLET (1,000 MG TOTAL) BY MOUTH 2 (TWO) TIMES DAILY WITH A MEAL. 06/24/20   Janora Norlander, DO  metoprolol succinate (TOPROL-XL) 100 MG 24 hr tablet TAKE 1 TABLET (100 MG TOTAL) BY MOUTH DAILY.  TAKE WITH OR IMMEDIATELY FOLLOWING A MEAL. 07/29/20   Ronnie Doss M, DO  rOPINIRole (REQUIP) 0.5 MG tablet TAKE 1 TABLET (0.5 MG TOTAL) BY MOUTH AT BEDTIME. 07/29/20   Ronnie Doss M, DO    Allergies    Quinapril hcl, Statins, and Tape  Review of Systems   Review of Systems  All other systems reviewed and are negative.   Physical Exam Updated Vital Signs BP (!) 84/34   Pulse (!) 138   Temp 97.8 F (36.6 C) (Rectal)   Resp (!) 23   Ht 5' 2.5" (1.588 m)   Wt 100.2 kg   SpO2 94%   BMI 39.78 kg/m   Physical Exam Vitals and nursing note reviewed.  Constitutional:      General: She is not in acute distress.    Appearance: She is well-developed. She is not diaphoretic.  HENT:     Head: Normocephalic and atraumatic.  Cardiovascular:     Rate and Rhythm: Tachycardia present. Rhythm irregular.     Heart sounds: No murmur heard. No friction rub. No gallop.   Pulmonary:     Effort: Pulmonary effort is normal. No respiratory distress.     Breath sounds: Normal breath sounds. No wheezing.  Abdominal:     General: Bowel sounds are normal. There is no distension.     Palpations: Abdomen is soft.     Tenderness: There is no abdominal tenderness.  Musculoskeletal:        General: Normal range of motion.      Cervical back: Normal range of motion and neck supple.  Skin:    General: Skin is warm and dry.     Coloration: Skin is pale.  Neurological:     Mental Status: She is alert and oriented to person, place, and time.     ED Results / Procedures / Treatments   Labs (all labs ordered are listed, but only abnormal results are displayed) Labs Reviewed  CBC WITH DIFFERENTIAL/PLATELET  COMPREHENSIVE METABOLIC PANEL  LIPASE, BLOOD  TROPONIN I (HIGH SENSITIVITY)    EKG EKG Interpretation  Date/Time:  Friday August 30 2020 00:18:30 EDT Ventricular Rate:  131 PR Interval:    QRS Duration: 156 QT Interval:  371 QTC Calculation: 548 R Axis:   -65 Text Interpretation: Atrial fibrillation Ventricular premature complex Left bundle branch block When compared with ECG dated 02/04/2020, atrial fibrillation has replaced sinus rhythm Confirmed by Veryl Speak [phone]) on 08/30/2020 12:38:02 AM   Radiology No results found.  Procedures Procedures   Medications Ordered in ED Medications  sodium chloride 0.9 % bolus 500 mL (has no administration in time range)    ED Course  I have reviewed the triage vital signs and the nursing notes.  Pertinent labs & imaging results that were available during my care of the patient were reviewed by me and considered in my medical decision making (see chart for details).    MDM Rules/Calculators/A&P  Patient is a 70 year old female with history of atrial fibrillation with watchman device.  She presents today with nausea, vomiting, and diarrhea that started earlier today.  This evening, she developed palpitations and weakness.  EMS was called and patient transported here.  She was found to be in A. fib with RVR with blood pressures in the 80H systolic.  She received IV fluids in route.  Additional IV fluids given upon patient arrival.  Her blood pressures have gradually trended upward and Cardizem drip to be started.  Patient's work-up shows atrial  fibrillation with RVR on her EKG, and white count of 14.5 but is otherwise unremarkable.  Her troponin is negative.  Patient to be admitted for rate control and further hydration and monitoring.  Patient inquired about the possibility of cardioversion, however I feel as though cardiology should make this determination given the presence of the watchman device.  CRITICAL CARE Performed by: Veryl Speak Total critical care time: 35 minutes Critical care time was exclusive of separately billable procedures and treating other patients. Critical care was necessary to treat or prevent imminent or life-threatening deterioration. Critical care was time spent personally by me on the following activities: development of treatment plan with patient and/or surrogate as well as nursing, discussions with consultants, evaluation of patient's response to treatment, examination of patient, obtaining history from patient or surrogate, ordering and performing treatments and interventions, ordering and review of laboratory studies, ordering and review of radiographic studies, pulse oximetry and re-evaluation of patient's condition.   Final Clinical Impression(s) / ED Diagnoses Final diagnoses:  None    Rx / DC Orders ED Discharge Orders    None       Veryl Speak, MD 08/30/20 [phone]

## 2020-08-31 DIAGNOSIS — E785 Hyperlipidemia, unspecified: Secondary | ICD-10-CM | POA: Diagnosis not present

## 2020-08-31 DIAGNOSIS — N390 Urinary tract infection, site not specified: Secondary | ICD-10-CM | POA: Diagnosis not present

## 2020-08-31 DIAGNOSIS — E1169 Type 2 diabetes mellitus with other specified complication: Secondary | ICD-10-CM

## 2020-08-31 DIAGNOSIS — R079 Chest pain, unspecified: Secondary | ICD-10-CM | POA: Diagnosis not present

## 2020-08-31 DIAGNOSIS — I4891 Unspecified atrial fibrillation: Secondary | ICD-10-CM | POA: Diagnosis not present

## 2020-08-31 LAB — GLUCOSE, CAPILLARY
Glucose-Capillary: 134 mg/dL — ABNORMAL HIGH (ref 70–99)
Glucose-Capillary: 174 mg/dL — ABNORMAL HIGH (ref 70–99)

## 2020-08-31 LAB — TROPONIN I (HIGH SENSITIVITY)
Troponin I (High Sensitivity): 594 ng/L
Troponin I (High Sensitivity): 533 ng/L (ref <18)

## 2020-08-31 MED ORDER — LOSARTAN POTASSIUM 25 MG PO TABS: 12.5000 mg | ORAL_TABLET | Freq: Every day | ORAL | Status: DC

## 2020-08-31 MED ORDER — METOPROLOL SUCCINATE ER 100 MG PO TB24: 100.0000 mg | ORAL_TABLET | Freq: Two times a day (BID) | ORAL | 2 refills | Status: DC

## 2020-08-31 MED ORDER — METOPROLOL TARTRATE 5 MG/5ML IV SOLN
5.0000 mg | Freq: Once | INTRAVENOUS | Status: AC
  Administered 2020-08-31: 5 mg via INTRAVENOUS
  Filled 2020-08-31: qty 5

## 2020-08-31 MED ORDER — CEFDINIR 300 MG PO CAPS: 300.0000 mg | ORAL_CAPSULE | Freq: Two times a day (BID) | ORAL | 0 refills | Status: AC

## 2020-08-31 NOTE — Plan of Care (Signed)

## 2020-08-31 NOTE — Discharge Summary (Signed)
Physician Discharge Summary  Emily [name] DQQ:[id number] DOB: 10-21-1950 DOA: 08/30/2020  PCP: Janora Norlander, DO  Admit date: 08/30/2020 Discharge date: 08/31/2020  Time spent: 35 minutes  Recommendations for Outpatient Follow-up:  1. Repeat basic metabolic panel to follow electrolytes and renal function 2. Patient to follow-up with cardiology service as instructed for further management of her A. fib and evaluation of recent elevated troponin   Discharge Diagnoses:  Active Problems:   Type 2 diabetes mellitus with hyperlipidemia (HCC)   Atrial fibrillation with RVR (HCC)   Chest pain   Acute lower UTI Hyperlipidemia  Discharge Condition: Stable and improved.  Discharged home with instruction to follow-up with PCP and cardiology service as an outpatient.  CODE STATUS: Full code.  Diet recommendation: Type 2 diabetes mellitus and heart healthy diet.  Filed Weights   08/30/20 0015 08/30/20 1422  Weight: 100.2 kg 101.6 kg    History of present illness:  Emily [name]  is a 70 y.o. female, with history of paroxysmal atrial fibrillation, obstructive sleep apnea, obesity, hypothyroidism, hypertension, hyperlipidemia, diabetes mellitus type 2, and more presents the ED with a chief complaint of palpitations.  Patient reports that on March 31 between 1 and 2 PM she started having vomiting and diarrhea.  She reports the release 5 or 6 episodes of vomiting at least 5 or 6 episodes of diarrhea.  Some episodes included both at the same time.  This continued between 1 or 2 PM and 11 PM.  She reports no hematemesis, hematochezia, melena.  She reports that she had all of her stomach pain that was like an ache when this was going on.  Vomiting or having a bowel movement would make the pain a little better.  Pain is completely resolved now.  Patient denies any fever.  She reports that she was otherwise in her normal state of health and even worked on the morning of March 31.  That evening she  started having palpitations.  She reports no actual chest pain but back and neck pain.  She had taken her 75 mg of flecainide twice as she was supposed to the day.  When the palpitations did not go away she came into the ED.  She was hoping to be cardioverted, especially since her watchman device sinus exactly how long she has been in A. fib, but she was not cardioverted in the ED because she was not on anticoagulation.  Patient did have soft blood pressures, but they improved with fluids.  In the ED Temp 97.8, heart rate 55-1 41, respiratory rate 14-36, blood pressure 108/53, satting 93% on room air Leukocytosis 14.5, hemoglobin 12.9, platelets 335 Chemistry panel reveals a decreased bicarb at 20, hyperglycemia at 254 Troponin is uptrending from 5-20 Last TSH earlier this month was 1.33, T4 1.45 EKG showed a heart rate of 131 A. fib with RVR and aberrancy 1 L NaCl was given, diltiazem drip was started Admission was requested for further management of A. fib with RVR  Hospital Course:  1-A. fib with RVR -Patient with underlying history of atrial fibrillation; status post watchman procedure 3 years ago -Currently receiving metoprolol, flecainide and Cardizem -Aspirin on daily basis; due to history of GI bleed for which she was taken off Eliquis. -Patient received IV Cardizem with resumption of adjusted rate control agents medications. -At discharge no complaining of palpitations and with rate control vital signs.  2-elevated troponin in the setting of demand ischemia -2D echo reassuring -Pending cardiology recommendations patient will follow-up as  an outpatient for my review -No acute ischemic work-up in setting hospital recommended -Advised not to use heparin. -Patient discharged on aspirin, continue beta-blockers, continue management as mentioned above for atrial fibrillation control and continue statins. -No chest pain at discharge.  3-type 2 diabetes with hyperglycemia -Recent A1c  6.2 -Resume home hypoglycemic regimen -Advised to maintain adequate hydration and to follow-up with her PCP.  4-UTI -No complaining of dysuria at discharge -Patient will complete treatment with oral cefdinir -Replacement adequate hydration.  5-hyperlipidemia -Continue statins and Zetia  6-hypokalemia -In the setting of GI losses and decreased oral intake -Repleted and within normal limits at discharge -No further episode of nausea, vomiting and tolerating diet.  7-chronic diastolic heart failure -Compensated -Continue as needed Lasix -Instructed to follow low-sodium diet and to check her weight on daily basis.  Procedures:  2D echo: Demonstrating grade 2 diastolic dysfunction, normal motion abnormalities, preserved ejection fraction.  Consultations:  Cardiology service  Discharge Exam: Vitals:   08/31/20 1015 08/31/20 1054  BP:  125/71  Pulse: (!) 133 64  Resp:  16  Temp:  98 F (36.7 C)  SpO2:  93%    General: No chest pain, no nausea, no vomiting.  Reports feeling ready to go home.  Denies palpitations currently. Cardiovascular: S1 and S2, positive systolic murmur, no rubs, no gallops, no JVD. Respiratory: Clear to auscultation bilaterally, no wheezing, no crackles. Abdomen: Soft, nontender, nondistended, positive bowel sounds. Extremities: no cyanosis, no clubbing   Discharge Instructions   Discharge Instructions    (HEART FAILURE PATIENTS) Call MD:  Anytime you have any of the following symptoms: 1) 3 pound weight gain in 24 hours or 5 pounds in 1 week 2) shortness of breath, with or without a dry hacking cough 3) swelling in the hands, feet or stomach 4) if you have to sleep on extra pillows at night in order to breathe.   Complete by: As directed    Diet - low sodium heart healthy   Complete by: As directed    Diet Carb Modified   Complete by: As directed    Discharge instructions   Complete by: As directed    Follow Heart healthy/low sodium diet Take  medications as prescribed  Completion of antibiotics as instructed Maintain adequate hydration  Arrange follow-up with PCP in 2 weeks.   Increase activity slowly   Complete by: As directed      Allergies as of 08/31/2020      Reactions   Quinapril Hcl Cough   Statins Other (See Comments)   Not all Statins but some cause cough and pain in legs.   Tape Other (See Comments)   Redness, please use "paper" tape      Medication List    TAKE these medications   aspirin EC 81 MG tablet Take by mouth.   cefdinir 300 MG capsule Commonly known as: OMNICEF Take 1 capsule (300 mg total) by mouth 2 (two) times daily for 3 days.   diltiazem 180 MG 24 hr capsule Commonly known as: CARDIZEM CD TAKE 1 CAPSULE BY MOUTH EVERY DAY   ezetimibe 10 MG tablet Commonly known as: ZETIA TAKE 1 TABLET BY MOUTH EVERY DAY   flecainide 150 MG tablet Commonly known as: TAMBOCOR TAKE 0.5 TABLETS (75 MG TOTAL) BY MOUTH 2 (TWO) TIMES DAILY.   furosemide 20 MG tablet Commonly known as: Lasix Take 1 tablet (20 mg total) by mouth daily as needed for fluid.   glipiZIDE 5 MG tablet Commonly known  as: GLUCOTROL TAKE 1 TABLET BY MOUTH TWICE A DAY   Jardiance 25 MG Tabs tablet Generic drug: empagliflozin TAKE 1 TABLET BY MOUTH DAILY BEFORE BREAKFAST   ketoconazole 2 % cream Commonly known as: NIZORAL Apply 1 application topically daily. Prn intertrigo rash   levothyroxine 112 MCG tablet Commonly known as: SYNTHROID TAKE 1 TABLET BY MOUTH EVERY OTHER DAY, ALTERNATING WITH 1 & 1/2 TABLETS EVERY OTHER DAY   losartan 25 MG tablet Commonly known as: COZAAR Take 0.5 tablets (12.5 mg total) by mouth daily. What changed: how much to take   lovastatin 40 MG tablet Commonly known as: MEVACOR TAKE 1 TABLET BY MOUTH EVERY DAY IN THE EVENING   metFORMIN 1000 MG tablet Commonly known as: GLUCOPHAGE TAKE 1 TABLET (1,000 MG TOTAL) BY MOUTH 2 (TWO) TIMES DAILY WITH A MEAL.   metoprolol succinate 100 MG 24 hr  tablet Commonly known as: TOPROL-XL Take 1 tablet (100 mg total) by mouth in the morning and at bedtime. Take with or immediately following a meal. What changed: when to take this   rOPINIRole 0.5 MG tablet Commonly known as: REQUIP TAKE 1 TABLET (0.5 MG TOTAL) BY MOUTH AT BEDTIME.      Allergies  Allergen Reactions  . Quinapril Hcl Cough  . Statins Other (See Comments)    Not all Statins but some cause cough and pain in legs.  . Tape Other (See Comments)    Redness, please use "paper" tape    Follow-up Information    Janora Norlander, DO. Schedule an appointment as soon as possible for a visit in 2 week(s).   Specialty: Family Medicine Contact information: [place] [address] [phone]        Minus Breeding, MD .   Specialty: Cardiology Contact information: [address] [place]  [address] [phone]                The results of significant diagnostics from this hospitalization (including imaging, microbiology, ancillary and laboratory) are listed below for reference.    Significant Diagnostic Studies: DG CHEST PORT 1 VIEW  Result Date: 08/30/2020 CLINICAL DATA:  Atrial fibrillation EXAM: PORTABLE CHEST 1 VIEW COMPARISON:  02/04/2020 FINDINGS: Cardiac shadow is stable. Aortic calcifications are again seen. Lungs are well aerated bilaterally. Mild central vascular congestion is noted similar to that seen on prior exam. Watchman device is noted within the left atrial appendage. No bony abnormality is seen. IMPRESSION: Mild vascular congestion stable in appearance from the prior exam. Electronically Signed   By: Inez Catalina M.D.   On: 08/30/2020 03:43   ECHOCARDIOGRAM COMPLETE  Result Date: 08/30/2020    ECHOCARDIOGRAM REPORT   Patient Name:   VONNA BRABSON Date of Exam: 08/30/2020 Medical Rec #:  [id number]        Height:       62.5 in Accession #:    [id number]       Weight:       221.0 lb Date of Birth:  [birth year]         BSA:          2.006 m Patient Age:    70 years         BP:           136/55 mmHg Patient Gender: F                HR:           63 bpm. Exam Location:  [name] Procedure: 2D Echo, Cardiac Doppler, Color Doppler and Intracardiac            Opacification Agent Indications:    Elevated Troponin  History:        Patient has prior history of Echocardiogram examinations, most                 recent 11/07/2019. Aortic Valve Disease, Arrythmias:Atrial                 Fibrillation and palpitations; Risk Factors:Obesity,                 Hypertension, Dyslipidemia and Diabetes. Elevated Troponin.  Sonographer:    Dustin Flock RDCS Referring Phys: [id number] [place] Comments: Patient is morbidly obese. Image acquisition challenging due to patient body habitus. IMPRESSIONS  1. Left ventricular ejection fraction, by estimation, is 60 to 65%. The left ventricle has normal function. The left ventricle has no regional wall motion abnormalities. There is moderate left ventricular hypertrophy. Left ventricular diastolic parameters are consistent with Grade II diastolic dysfunction (pseudonormalization). Elevated left atrial pressure.  2. Right ventricular systolic function is normal. The right ventricular size is normal.  3. Left atrial size was moderately dilated.  4. The pericardial effusion is circumferential.  5. The mitral valve is normal in structure. Trivial mitral valve regurgitation. No evidence of mitral stenosis. Moderate mitral annular calcification.  6. The aortic valve is tricuspid. There is moderate calcification of the aortic valve. There is moderate thickening of the aortic valve. Aortic valve regurgitation is not visualized. Moderate aortic valve stenosis.  7. The inferior vena cava is dilated in size with <50% respiratory variability, suggesting right atrial pressure of 15 mmHg. FINDINGS  Left Ventricle: Left ventricular ejection fraction, by estimation, is 60 to 65%. The left ventricle  has normal function. The left ventricle has no regional wall motion abnormalities. Definity contrast agent was given IV to delineate the left ventricular  endocardial borders. The left ventricular internal cavity size was normal in size. There is moderate left ventricular hypertrophy. Left ventricular diastolic parameters are consistent with Grade II diastolic dysfunction (pseudonormalization). Elevated left atrial pressure. Right Ventricle: The right ventricular size is normal. No increase in right ventricular wall thickness. Right ventricular systolic function is normal. Left Atrium: Left atrial size was moderately dilated. Right Atrium: Right atrial size was normal in size. Pericardium: Trivial pericardial effusion is present. The pericardial effusion is circumferential. Mitral Valve: The mitral valve is normal in structure. There is moderate thickening of the mitral valve leaflet(s). There is moderate calcification of the mitral valve leaflet(s). Moderate mitral annular calcification. Trivial mitral valve regurgitation.  No evidence of mitral valve stenosis. Tricuspid Valve: The tricuspid valve is normal in structure. Tricuspid valve regurgitation is not demonstrated. No evidence of tricuspid stenosis. Aortic Valve: The aortic valve is tricuspid. There is moderate calcification of the aortic valve. There is moderate thickening of the aortic valve. There is moderate aortic valve annular calcification. Aortic valve regurgitation is not visualized. Moderate aortic stenosis is present. Aortic valve mean gradient measures 20.0 mmHg. Aortic valve peak gradient measures 33.9 mmHg. Aortic valve area, by VTI measures 1.29 cm. Pulmonic Valve: The pulmonic valve was not well visualized. Pulmonic valve regurgitation is trivial. No evidence of pulmonic stenosis. Aorta: The aortic root is normal in size and structure. Pulmonary Artery: Indeterminant PASP, inadequate TR jet. Venous: The inferior vena cava is dilated in size  with less than 50% respiratory variability, suggesting right  atrial pressure of 15 mmHg. IAS/Shunts: The interatrial septum was not well visualized.  LEFT VENTRICLE PLAX 2D LVIDd:         3.93 cm  Diastology LVIDs:         2.70 cm  LV e' medial:    5.87 cm/s LV PW:         1.48 cm  LV E/e' medial:  23.7 LV IVS:        1.52 cm  LV e' lateral:   4.57 cm/s LVOT diam:     2.20 cm  LV E/e' lateral: 30.4 LV SV:         99 LV SV Index:   49 LVOT Area:     3.80 cm  RIGHT VENTRICLE RV Basal diam:  2.78 cm RV S prime:     6.31 cm/s TAPSE (M-mode): 2.8 cm LEFT ATRIUM              Index       RIGHT ATRIUM           Index LA diam:        3.90 cm  1.94 cm/m  RA Area:     17.30 cm LA Vol (A2C):   105.0 ml 52.33 ml/m RA Volume:   46.20 ml  23.03 ml/m LA Vol (A4C):   63.7 ml  31.75 ml/m LA Biplane Vol: 86.7 ml  43.21 ml/m  AORTIC VALVE AV Area (Vmax):    1.26 cm AV Area (Vmean):   1.28 cm AV Area (VTI):     1.29 cm AV Vmax:           291.00 cm/s AV Vmean:          215.000 cm/s AV VTI:            0.770 m AV Peak Grad:      33.9 mmHg AV Mean Grad:      20.0 mmHg LVOT Vmax:         96.70 cm/s LVOT Vmean:        72.400 cm/s LVOT VTI:          0.261 m LVOT/AV VTI ratio: 0.34  AORTA Ao Root diam: 2.80 cm MITRAL VALVE MV Area (PHT): 2.20 cm     SHUNTS MV Decel Time: 345 msec     Systemic VTI:  0.26 m MV E velocity: 139.00 cm/s  Systemic Diam: 2.20 cm MV A velocity: 97.40 cm/s MV E/A ratio:  1.43 Carlyle Dolly MD Electronically signed by Carlyle Dolly MD Signature Date/Time: 08/30/2020/3:10:51 PM    Final     Microbiology: Recent Results (from the past 240 hour(s))  Resp Panel by RT-PCR (Flu A&B, Covid) Nasopharyngeal Swab     Status: None   Collection Time: 08/30/20  2:50 AM   Specimen: Nasopharyngeal Swab; Nasopharyngeal(NP) swabs in vial transport medium  Result Value Ref Range Status   SARS Coronavirus 2 by RT PCR NEGATIVE NEGATIVE Final    Comment: (NOTE) SARS-CoV-2 target nucleic acids are NOT DETECTED.  The  SARS-CoV-2 RNA is generally detectable in upper respiratory specimens during the acute phase of infection. The lowest concentration of SARS-CoV-2 viral copies this assay can detect is 138 copies/mL. A negative result does not preclude SARS-Cov-2 infection and should not be used as the sole basis for treatment or other patient management decisions. A negative result may occur with  improper specimen collection/handling, submission of specimen other than nasopharyngeal swab, presence of viral mutation(s) within the areas targeted by this  assay, and inadequate number of viral copies(<138 copies/mL). A negative result must be combined with clinical observations, patient history, and epidemiological information. The expected result is Negative.  Fact Sheet for Patients:  EntrepreneurPulse.com.au  Fact Sheet for Healthcare Providers:  IncredibleEmployment.be  This test is no t yet approved or cleared by the [place] FDA and  has been authorized for detection and/or diagnosis of SARS-CoV-2 by FDA under an Emergency Use Authorization (EUA). This EUA will remain  in effect (meaning this test can be used) for the duration of the COVID-19 declaration under Section 564(b)(1) of the Act, 21 U.S.C.section 360bbb-3(b)(1), unless the authorization is terminated  or revoked sooner.       Influenza A by PCR NEGATIVE NEGATIVE Final   Influenza B by PCR NEGATIVE NEGATIVE Final    Comment: (NOTE) The Xpert Xpress SARS-CoV-2/FLU/RSV plus assay is intended as an aid in the diagnosis of influenza from Nasopharyngeal swab specimens and should not be used as a sole basis for treatment. Nasal washings and aspirates are unacceptable for Xpert Xpress SARS-CoV-2/FLU/RSV testing.  Fact Sheet for Patients: EntrepreneurPulse.com.au  Fact Sheet for Healthcare Providers: IncredibleEmployment.be  This test is not yet approved or  cleared by the [place] FDA and has been authorized for detection and/or diagnosis of SARS-CoV-2 by FDA under an Emergency Use Authorization (EUA). This EUA will remain in effect (meaning this test can be used) for the duration of the COVID-19 declaration under Section 564(b)(1) of the Act, 21 U.S.C. section 360bbb-3(b)(1), unless the authorization is terminated or revoked.  Performed at [facility], [address], [place], [place] [address]      Labs: Basic Metabolic Panel: Recent Labs  Lab 08/30/20 0038 08/30/20 0534  NA 138 140  K 3.7 3.9  CL 105 105  CO2 20* 22  GLUCOSE 254* 193*  BUN 36* 34*  CREATININE 0.69 0.61  CALCIUM 8.5* 8.3*  MG  --  2.0   Liver Function Tests: Recent Labs  Lab 08/30/20 0038 08/30/20 0534  AST 23 31  ALT 22 24  ALKPHOS 96 93  BILITOT 1.8* 1.4*  PROT 7.6 7.2  ALBUMIN 4.1 3.9   Recent Labs  Lab 08/30/20 0038  LIPASE 22   No results for input(s): AMMONIA in the last 168 hours. CBC: Recent Labs  Lab 08/30/20 0038 08/30/20 0534  WBC 14.5* 9.5  NEUTROABS 13.7* 9.0*  HGB 12.9 12.2  HCT 40.8 39.5  MCV 93.8 95.6  PLT 335 215    CBG: Recent Labs  Lab 08/30/20 1224 08/30/20 1607 08/30/20 2102 08/31/20 0739 08/31/20 1115  GLUCAP 124* 159* 171* 134* 174*    Signed:  Barton Dubois MD.  [facility] 08/31/2020, 1:28 PM

## 2020-08-31 NOTE — Care Management Obs Status (Signed)
[facility] NOTIFICATION   Patient Details  Name: Deepa Barthel MRN: [id number] Date of Birth: 06/07/1950   Medicare Observation Status Notification Given:  Yes    Natasha Bence, LCSW 08/31/2020, 1:17 PM

## 2020-08-31 NOTE — Plan of Care (Signed)
  Problem: Education: Goal: Knowledge of General Education information will improve Description: Including pain rating scale, medication(s)/side effects and non-pharmacologic comfort measures 08/31/2020 1417 by Adam Phenix, RN Outcome: Adequate for Discharge 08/31/2020 0804 by Adam Phenix, RN Outcome: Progressing   Problem: Health Behavior/Discharge Planning: Goal: Ability to manage health-related needs will improve 08/31/2020 1417 by Adam Phenix, RN Outcome: Adequate for Discharge 08/31/2020 0804 by Adam Phenix, RN Outcome: Progressing   Problem: Clinical Measurements: Goal: Ability to maintain clinical measurements within normal limits will improve 08/31/2020 1417 by Adam Phenix, RN Outcome: Adequate for Discharge 08/31/2020 0804 by Adam Phenix, RN Outcome: Progressing Goal: Will remain free from infection 08/31/2020 1417 by Adam Phenix, RN Outcome: Adequate for Discharge 08/31/2020 0804 by Adam Phenix, RN Outcome: Progressing Goal: Diagnostic test results will improve 08/31/2020 1417 by Adam Phenix, RN Outcome: Adequate for Discharge 08/31/2020 0804 by Adam Phenix, RN Outcome: Progressing Goal: Respiratory complications will improve 08/31/2020 1417 by Adam Phenix, RN Outcome: Adequate for Discharge 08/31/2020 0804 by Adam Phenix, RN Outcome: Progressing Goal: Cardiovascular complication will be avoided 08/31/2020 1417 by Adam Phenix, RN Outcome: Adequate for Discharge 08/31/2020 0804 by Adam Phenix, RN Outcome: Progressing   Problem: Activity: Goal: Risk for activity intolerance will decrease 08/31/2020 1417 by Adam Phenix, RN Outcome: Adequate for Discharge 08/31/2020 0804 by Adam Phenix, RN Outcome: Progressing   Problem: Nutrition: Goal: Adequate nutrition will be maintained 08/31/2020 1417 by Adam Phenix, RN Outcome: Adequate for  Discharge 08/31/2020 0804 by Adam Phenix, RN Outcome: Progressing   Problem: Coping: Goal: Level of anxiety will decrease 08/31/2020 1417 by Adam Phenix, RN Outcome: Adequate for Discharge 08/31/2020 0804 by Adam Phenix, RN Outcome: Progressing   Problem: Elimination: Goal: Will not experience complications related to bowel motility 08/31/2020 1417 by Adam Phenix, RN Outcome: Adequate for Discharge 08/31/2020 0804 by Adam Phenix, RN Outcome: Progressing Goal: Will not experience complications related to urinary retention 08/31/2020 1417 by Adam Phenix, RN Outcome: Adequate for Discharge 08/31/2020 0804 by Adam Phenix, RN Outcome: Progressing   Problem: Pain Managment: Goal: General experience of comfort will improve 08/31/2020 1417 by Adam Phenix, RN Outcome: Adequate for Discharge 08/31/2020 0804 by Adam Phenix, RN Outcome: Progressing   Problem: Safety: Goal: Ability to remain free from injury will improve 08/31/2020 1417 by Adam Phenix, RN Outcome: Adequate for Discharge 08/31/2020 0804 by Adam Phenix, RN Outcome: Progressing   Problem: Skin Integrity: Goal: Risk for impaired skin integrity will decrease 08/31/2020 1417 by Adam Phenix, RN Outcome: Adequate for Discharge 08/31/2020 0804 by Adam Phenix, RN Outcome: Progressing

## 2020-08-31 NOTE — Progress Notes (Signed)
   08/31/20 0745  Assess: MEWS Score  BP (!) 148/110  Pulse Rate (!) 121 (tele monitor showed 150s- MD paged)  Level of Consciousness Alert  Assess: MEWS Score  MEWS Temp 0  MEWS Systolic 0  MEWS Pulse 2  MEWS RR 0  MEWS LOC 0  MEWS Score 2  MEWS Score Color Yellow  Assess: if the MEWS score is Yellow or Red  Were vital signs taken at a resting state? Yes  Focused Assessment Change from prior assessment (see assessment flowsheet)  Early Detection of Sepsis Score *See Row Information* Medium  MEWS guidelines implemented *See Row Information* Yes  Treat  MEWS Interventions Administered scheduled meds/treatments;Escalated (See documentation below)  Pain Scale 0-10  Pain Score 0  Complains of Shortness of breath  Interventions Medication (see MAR);Relaxation  Patients response to intervention Unchanged  Take Vital Signs  Increase Vital Sign Frequency  Yellow: Q 2hr X 2 then Q 4hr X 2, if remains yellow, continue Q 4hrs  Escalate  MEWS: Escalate Yellow: discuss with charge nurse/RN and consider discussing with provider and RRT  Notify: Charge Nurse/RN  Name of Charge Nurse/RN Notified Audrea Muscat  Date Charge Nurse/RN Notified 08/31/20  Time Charge Nurse/RN Notified 2458  Notify: Provider  Provider Name/Title Dyann Kief  Date Provider Notified 08/31/20  Time Provider Notified 0745  Notification Type Page  Notification Reason Change in status (afib rvr)  Provider response No new orders (says ok to give po 1000 meds now)  Date of Provider Response 08/31/20  Time of Provider Response [phone]

## 2020-08-31 NOTE — Care Management CC44 (Signed)
Condition Code 44 Documentation Completed  Patient Details  Name: Emily [name] MRN: [id number] Date of Birth: Jan 07, 1951   Condition Code 44 given:  Yes Patient signature on Condition Code 44 notice:  Yes Documentation of 2 MD's agreement:  Yes Code 44 added to claim:  Yes    Natasha Bence, LCSW 08/31/2020, 1:18 PM

## 2020-09-01 ENCOUNTER — Telehealth: Payer: Self-pay | Admitting: Physician Assistant

## 2020-09-01 NOTE — Telephone Encounter (Signed)
Patient was just released from the hospital yesterday.  She had atrial fibrillation with RVR and elevated troponin in the setting of GI illness, dehydration and a urinary tract infection.  During the hospitalization, patient converted back to sinus rhythm.  She mentions that she did have another episode of atrial fibrillation yesterday on the day of her discharge, however converted back to sinus rhythm prior to going home.  She felt fine for the rest of the day yesterday.  This morning around 7 AM, she had recurrent symptom of palpitation.  Pulse ox shows her heart rate was initially in the 170s range which gradually came down to 150s.  She was discharged with plan to follow-up as outpatient and undergo Myoview given the elevated troponin which was felt to be demand ischemia in the setting of atrial fibrillation with RVR.  She has been compliant with her long-acting diltiazem and metoprolol succinate.  She took extra 30 mg short acting diltiazem this morning after realizing she has went into atrial fibrillation.  She already took 75 mg of flecainide this morning.  I also instructed her to take additional half a tablet of flecainide.  I recommend she wait about 2 hours, if her heart rate is still above 120s, she will need to come back to the emergency room.

## 2020-09-02 ENCOUNTER — Telehealth: Payer: Self-pay

## 2020-09-02 DIAGNOSIS — R778 Other specified abnormalities of plasma proteins: Secondary | ICD-10-CM

## 2020-09-02 NOTE — Telephone Encounter (Signed)
Not sure why this has been sent to me but please make sure pt has appropriate follow up

## 2020-09-02 NOTE — Telephone Encounter (Signed)
Order placed for lexiscan, forward to front desk to schedule.

## 2020-09-02 NOTE — Telephone Encounter (Signed)
Appt has been made.

## 2020-09-02 NOTE — Telephone Encounter (Signed)
-----   Message from Erma Heritage, [place] sent at 09/01/2020 11:20 AM EDT ----- Regarding: Outpatient Lexiscan Myoview This patient needs an outpatient Lexiscan Myoview per Dr. Domenic Polite for elevated troponin values. Was discharged home over the weekend. Would see if it can be performed this week if the schedule allows. Will need follow-up with Dr. Percival Spanish or an APP within the next 3-4 weeks but would make sure her stress test has been performed before follow-up.   Thanks,  [place]

## 2020-09-02 NOTE — Progress Notes (Deleted)
Cardiology Office Note   Date:  09/02/2020   ID:  Emily [name], DOB 1950-12-27, MRN [id number]  PCP:  Janora Norlander, DO  Cardiologist:   Minus Breeding, MD   No chief complaint on file.     History of Present Illness: Emily [name] is a 70 y.o. female who presents for followup of atrial fib.   The patient has paroxysmal atrial fibrillation and was treated with flecainide. She has had recurrent GI bleeding and could not tolerate anticoagulation.  She was getting follow-up of her Watchman device at [facility].   In December she had PAF and called EMS.  She had GI bleeding again and she was in the hospital in April for this.  She has had a complete work-up and not been able to identify a source.  She has had transfusions.  She subsequently had double-balloon enteroscopy.   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  She did have Watchman.  She has moderate AS.   She was in the hospital for one day earlier this month with atrial fib with RVR.  She spontaneously converted to NSR after IV Dilt.  She had a UTI.  ***    ***  and I reviewed this as well.  Most recently she had a transthoracic echo with and her ejection fraction is well-preserved.  She had moderate aortic valve stenosis.  There is some mitral regurgitation but it was trivial on transthoracic but more and slightly advanced transesophageal.  She presents for follow up.  She was in the emergency room in early September with paroxysmal atrial fibrillation. I reviewed these records for this visit.    She said this is the first episode since December of last year.  She otherwise has been feeling okay.  She did have to come off her hydrochlorothiazide because her blood pressure was running low.  As her hemoglobin is slowly coming up and she has not had any further GI bleeding her blood pressure starting to come up a little bit.  She denies any new cardiovascular symptoms such as chest pressure, neck or  arm discomfort.  She has had no new shortness of breath, PND or orthopnea.  She has had no weight gain or edema.   Past Medical History:  Diagnosis Date  . Anemia   . Arthritis   . Atrial fibrillation, rapid ([place]) 10/27/2013  . Bilateral carotid artery disease ([place])   . Diabetes mellitus without complication ([place])   . Difficult intubation    states 'lady that did the sleep study told me I have the smallest airway she has ever seen in an adult"  . Dyslipidemia 06/29/2017  . Dysrhythmia   . Hypertension   . Hypothyroid   . Obesity   . Obstructive sleep apnea    on C Pap  . PAF (paroxysmal atrial fibrillation) ([place])    a. newly dx in 09/2013; on eliquis    Past Surgical History:  Procedure Laterality Date  . CATARACT EXTRACTION Left   . CATARACT EXTRACTION W/PHACO Right 12/09/2015   Procedure: CATARACT EXTRACTION PHACO AND INTRAOCULAR LENS PLACEMENT (IOC);  Surgeon: Tonny Branch, MD;  Location: [facility];  Service: Ophthalmology;  Laterality: Right;  CDE:10.48  . COLONOSCOPY W/ POLYPECTOMY    . cyst on back of neck removed    . DILATION AND CURETTAGE OF UTERUS     x 5  . ENDARTERECTOMY Left 08/30/2015   Procedure: LEFT CAROYID ENDARTERECTOMY WITH XENOSURE BOVINE PERICARDIUM  PATCH ANGIOPLASTY;  Surgeon: [name], MD;  Location: [facility];  Service: Vascular;  Laterality: Left;  . EYE SURGERY    . TONSILLECTOMY    . UPPER GI ENDOSCOPY       Current Outpatient Medications  Medication Sig Dispense Refill  . aspirin EC 81 MG tablet Take by mouth.    . cefdinir (OMNICEF) 300 MG capsule Take 1 capsule (300 mg total) by mouth 2 (two) times daily for 3 days. 6 capsule 0  . diltiazem (CARDIZEM CD) 180 MG 24 hr capsule TAKE 1 CAPSULE BY MOUTH EVERY DAY 90 capsule 3  . ezetimibe (ZETIA) 10 MG tablet TAKE 1 TABLET BY MOUTH EVERY DAY 90 tablet 3  . flecainide (TAMBOCOR) 150 MG tablet TAKE 0.5 TABLETS (75 MG TOTAL) BY MOUTH 2 (TWO) TIMES DAILY. 90 tablet 0  . furosemide (LASIX) 20 MG tablet  Take 1 tablet (20 mg total) by mouth daily as needed for fluid. 90 tablet 0  . glipiZIDE (GLUCOTROL) 5 MG tablet TAKE 1 TABLET BY MOUTH TWICE A DAY 180 tablet 0  . JARDIANCE 25 MG TABS tablet TAKE 1 TABLET BY MOUTH DAILY BEFORE BREAKFAST 30 tablet 2  . ketoconazole (NIZORAL) 2 % cream Apply 1 application topically daily. Prn intertrigo rash 60 g 1  . levothyroxine (SYNTHROID) 112 MCG tablet TAKE 1 TABLET BY MOUTH EVERY OTHER DAY, ALTERNATING WITH 1 & 1/2 TABLETS EVERY OTHER DAY 112 tablet 3  . losartan (COZAAR) 25 MG tablet Take 0.5 tablets (12.5 mg total) by mouth daily.    Marland Kitchen lovastatin (MEVACOR) 40 MG tablet TAKE 1 TABLET BY MOUTH EVERY DAY IN THE EVENING 90 tablet 3  . metFORMIN (GLUCOPHAGE) 1000 MG tablet TAKE 1 TABLET (1,000 MG TOTAL) BY MOUTH 2 (TWO) TIMES DAILY WITH A MEAL. 180 tablet 0  . metoprolol succinate (TOPROL-XL) 100 MG 24 hr tablet Take 1 tablet (100 mg total) by mouth in the morning and at bedtime. Take with or immediately following a meal. 60 tablet 2  . rOPINIRole (REQUIP) 0.5 MG tablet TAKE 1 TABLET (0.5 MG TOTAL) BY MOUTH AT BEDTIME. 90 tablet 0   Current Facility-Administered Medications  Medication Dose Route Frequency Provider Last Rate Last Admin  . cyanocobalamin ((VITAMIN B-12)) injection 1,000 mcg  1,000 mcg Intramuscular Q30 days Timmothy Euler, MD   1,000 mcg at 08/27/20 1501    Allergies:   Quinapril hcl, Statins, and Tape   ROS:  Please see the history of present illness.   Otherwise, review of systems are positive for ***.   All other systems are reviewed and negative.    PHYSICAL EXAM: VS:  There were no vitals taken for this visit. , BMI There is no height or weight on file to calculate BMI. GENERAL:  Well appearing NECK:  No jugular venous distention, waveform within normal limits, carotid upstroke brisk and symmetric, no bruits, no thyromegaly LUNGS:  Clear to auscultation bilaterally CHEST:  Unremarkable HEART:  PMI not displaced or sustained,S1  and S2 within normal limits, no S3, no S4, no clicks, no rubs, *** murmurs ABD:  Flat, positive bowel sounds normal in frequency in pitch, no bruits, no rebound, no guarding, no midline pulsatile mass, no hepatomegaly, no splenomegaly EXT:  2 plus pulses throughout, no edema, no cyanosis no clubbing    ***GENERAL:  Well appearing NECK:  No jugular venous distention, waveform within normal limits, carotid upstroke brisk and symmetric, no bruits, no thyromegaly LUNGS:  Clear to auscultation bilaterally CHEST:  Unremarkable HEART:  PMI not displaced or sustained,S1 and S2 within normal limits, no S3, no S4, no clicks, no rubs, 2 out of 6 apical cyst murmur radiating up the aortic outflow tract and early peaking, no diastolic murmurs ABD:  Flat, positive bowel sounds normal in frequency in pitch, no bruits, no rebound, no guarding, no midline pulsatile mass, no hepatomegaly, no splenomegaly EXT:  2 plus pulses throughout, no edema, no cyanosis no clubbing   EKG:  EKG is *** ordered today.   Recent Labs: 08/30/2020: ALT 24; BUN 34; Creatinine, Ser 0.61; Hemoglobin 12.2; Magnesium 2.0; Platelets 215; Potassium 3.9; Sodium 140; TSH 0.802    Lipid Panel    Component Value Date/Time   CHOL 145 09/08/2019 1015   TRIG 156 (H) 09/08/2019 1015   HDL 37 (L) 09/08/2019 1015   CHOLHDL 3.9 09/08/2019 1015   LDLCALC 81 09/08/2019 1015      Wt Readings from Last 3 Encounters:  08/30/20 224 lb (101.6 kg)  08/20/20 221 lb (100.2 kg)  04/02/20 222 lb 14.4 oz (101.1 kg)      Other studies Reviewed: Additional studies/ records that were reviewed today include:  *** Review of the above records demonstrates:  Please see elsewhere in the note.     ASSESSMENT AND PLAN:  ATRIAL FIB:   .  [name] has a CHA2DS2 - VASc score of 4.   *** She and I had a long discussion about this.  At this point we do not think this dose of flecainide is failing.  However, if she has more frequent paroxysms  I would probably increase the dose or switch to Tikosyn otherwise no change in therapy.   GI BLEEDING:  ***  She has had no further GI bleeding.  No further work-up.  HTN:   The blood pressure is at target.  No change in therapy.    CAROTID STENOSIS:  She had 60 - 79% right stenosis and left 40 - 59% stenosis in March of this year.  ***  She is to have follow up with [facility].  She has follow-up scheduled soon.   OBESITY:  ***  We had talked about this at length in the past.   DM:  A1c was *** 6.8.  No change in therapy.   AS/LVH:   She had moderate AS.  I will foloow this clinically.  ***  she had normal LVEF.  I will follow this clinically and with a repeat echo next year.    Current medicines are reviewed at length with the patient today.  The patient does not have concerns regarding medicines.  The following changes have been made:  ***  Labs/ tests ordered today include: None  No orders of the defined types were placed in this encounter.    Disposition:   FU with me in *** year.     Signed, Minus Breeding, MD  09/02/2020 8:34 PM    [facility]

## 2020-09-02 NOTE — Telephone Encounter (Signed)
LMTCB to make Hospital follow-up appt

## 2020-09-02 NOTE — Telephone Encounter (Signed)
Emily [name] was hospitalized at [facility] from 08/29/20 to 08/31/20 and was told to follow up with PCP in 2 weeks. Please call her to schedule appt. Thank you!

## 2020-09-02 NOTE — Telephone Encounter (Signed)
lmom for patient to call back to schedule testing

## 2020-09-03 ENCOUNTER — Ambulatory Visit (HOSPITAL_COMMUNITY): Payer: Medicare Other | Admitting: Physician Assistant

## 2020-09-03 ENCOUNTER — Telehealth (HOSPITAL_COMMUNITY): Payer: Medicare Other | Admitting: Physician Assistant

## 2020-09-03 ENCOUNTER — Ambulatory Visit: Payer: Medicare Other | Admitting: Cardiology

## 2020-09-03 ENCOUNTER — Telehealth: Payer: Self-pay

## 2020-09-03 DIAGNOSIS — E118 Type 2 diabetes mellitus with unspecified complications: Secondary | ICD-10-CM

## 2020-09-03 DIAGNOSIS — I350 Nonrheumatic aortic (valve) stenosis: Secondary | ICD-10-CM

## 2020-09-03 DIAGNOSIS — I480 Paroxysmal atrial fibrillation: Secondary | ICD-10-CM

## 2020-09-03 DIAGNOSIS — I6523 Occlusion and stenosis of bilateral carotid arteries: Secondary | ICD-10-CM

## 2020-09-03 LAB — URINE CULTURE: Culture: >=100000 — AB

## 2020-09-03 MED ORDER — FUROSEMIDE 20 MG PO TABS: 20.0000 mg | ORAL_TABLET | Freq: Every day | ORAL | 0 refills | Status: DC | PRN

## 2020-09-03 NOTE — Telephone Encounter (Signed)
Pt aware = sent 1 ref[facility]

## 2020-09-04 ENCOUNTER — Ambulatory Visit (INDEPENDENT_AMBULATORY_CARE_PROVIDER_SITE_OTHER): Payer: Medicare Other | Admitting: Family Medicine

## 2020-09-04 ENCOUNTER — Encounter: Payer: Self-pay | Admitting: Family Medicine

## 2020-09-04 ENCOUNTER — Other Ambulatory Visit: Payer: Self-pay

## 2020-09-04 VITALS — BP 138/80 | HR 48 | Temp 98.2°F | Ht 63.0 in | Wt 223.0 lb

## 2020-09-04 DIAGNOSIS — Z09 Encounter for follow-up examination after completed treatment for conditions other than malignant neoplasm: Secondary | ICD-10-CM

## 2020-09-04 DIAGNOSIS — I480 Paroxysmal atrial fibrillation: Secondary | ICD-10-CM

## 2020-09-04 DIAGNOSIS — N3000 Acute cystitis without hematuria: Secondary | ICD-10-CM | POA: Diagnosis not present

## 2020-09-04 LAB — HEMOGLOBIN, FINGERSTICK: Hemoglobin: 11.3 g/dL (ref 11.1–15.9)

## 2020-09-04 NOTE — Patient Instructions (Addendum)
You had labs performed today.  You will be contacted with the results of the labs once they are available, usually in the next 3 business days for routine lab work.  If you have an active my chart account, they will be released to your MyChart.  If you prefer to have these labs released to you via telephone, please let us know.  Lasix twice daily for 2 days then go back to once daily if needed Keep Losartan 1/2 tablet for now.  Monitor Blood pressures and if >150/90, will plan to increase back to 1 tablet again.

## 2020-09-04 NOTE — Progress Notes (Signed)
Subjective: CC: Hospital discharge follow-up PCP: Janora Norlander, DO Emily [name] is a 71 y.o. female presenting to clinic today for:  1.  Hospital discharge follow-up Patient reports that she had had a GI virus last Thursday and apparently became dehydrated and went into atrial fibrillation.  She subsequently called EMS and was found to have elevated troponins and in fact to be out of rhythm.  She was treated with medication but apparently went in and out of rhythm all weekend.  Her troponins peaked at 1738.  She has a stress test scheduled for Friday with her cardiologist, Dr. Percival Spanish.  She was discharged on half dose of losartan given borderline low blood pressure.  She is been taking Lasix daily in efforts to improve lower extremity edema.  She does admit to some easy dyspnea on exertion but this is not to the extent that she had when she was severely anemic.  She does not report orthopnea.  Her GI symptoms have totally resolved.  She has been compliant with her A. fib medications.   ROS: Per HPI  Allergies  Allergen Reactions  . Quinapril Hcl Cough  . Statins Other (See Comments)    Not all Statins but some cause cough and pain in legs.  . Tape Other (See Comments)    Redness, please use "paper" tape   Past Medical History:  Diagnosis Date  . Anemia   . Arthritis   . Atrial fibrillation, rapid ([place]) 10/27/2013  . Bilateral carotid artery disease ([place])   . Diabetes mellitus without complication ([place])   . Difficult intubation    states 'lady that did the sleep study told me I have the smallest airway she has ever seen in an adult"  . Dyslipidemia 06/29/2017  . Dysrhythmia   . Hypertension   . Hypothyroid   . Obesity   . Obstructive sleep apnea    on C Pap  . PAF (paroxysmal atrial fibrillation) ([place])    a. newly dx in 09/2013; on eliquis    Current Outpatient Medications:  .  aspirin EC 81 MG tablet, Take by mouth., Disp: , Rfl:  .  diltiazem (CARDIZEM CD)  180 MG 24 hr capsule, TAKE 1 CAPSULE BY MOUTH EVERY DAY, Disp: 90 capsule, Rfl: 3 .  ezetimibe (ZETIA) 10 MG tablet, TAKE 1 TABLET BY MOUTH EVERY DAY, Disp: 90 tablet, Rfl: 3 .  flecainide (TAMBOCOR) 150 MG tablet, TAKE 0.5 TABLETS (75 MG TOTAL) BY MOUTH 2 (TWO) TIMES DAILY., Disp: 90 tablet, Rfl: 0 .  furosemide (LASIX) 20 MG tablet, Take 1 tablet (20 mg total) by mouth daily as needed for fluid., Disp: 90 tablet, Rfl: 0 .  glipiZIDE (GLUCOTROL) 5 MG tablet, TAKE 1 TABLET BY MOUTH TWICE A DAY, Disp: 180 tablet, Rfl: 0 .  JARDIANCE 25 MG TABS tablet, TAKE 1 TABLET BY MOUTH DAILY BEFORE BREAKFAST, Disp: 30 tablet, Rfl: 2 .  ketoconazole (NIZORAL) 2 % cream, Apply 1 application topically daily. Prn intertrigo rash, Disp: 60 g, Rfl: 1 .  levothyroxine (SYNTHROID) 112 MCG tablet, TAKE 1 TABLET BY MOUTH EVERY OTHER DAY, ALTERNATING WITH 1 & 1/2 TABLETS EVERY OTHER DAY, Disp: 112 tablet, Rfl: 3 .  losartan (COZAAR) 25 MG tablet, Take 0.5 tablets (12.5 mg total) by mouth daily., Disp: , Rfl:  .  lovastatin (MEVACOR) 40 MG tablet, TAKE 1 TABLET BY MOUTH EVERY DAY IN THE EVENING, Disp: 90 tablet, Rfl: 3 .  metFORMIN (GLUCOPHAGE) 1000 MG tablet, TAKE 1 TABLET (1,000 MG  TOTAL) BY MOUTH 2 (TWO) TIMES DAILY WITH A MEAL., Disp: 180 tablet, Rfl: 0 .  metoprolol succinate (TOPROL-XL) 100 MG 24 hr tablet, Take 1 tablet (100 mg total) by mouth in the morning and at bedtime. Take with or immediately following a meal., Disp: 60 tablet, Rfl: 2 .  rOPINIRole (REQUIP) 0.5 MG tablet, TAKE 1 TABLET (0.5 MG TOTAL) BY MOUTH AT BEDTIME., Disp: 90 tablet, Rfl: 0  Current Facility-Administered Medications:  .  cyanocobalamin ((VITAMIN B-12)) injection 1,000 mcg, 1,000 mcg, Intramuscular, Q30 days, Timmothy Euler, MD, 1,000 mcg at 08/27/20 1501 Social History   Socioeconomic History  . Marital status: Single    Spouse name: Not on file  . Number of children: Not on file  . Years of education: Not on file  . Highest  education level: Not on file  Occupational History  . Not on file  Tobacco Use  . Smoking status: Never Smoker  . Smokeless tobacco: Never Used  Vaping Use  . Vaping Use: Never used  Substance and Sexual Activity  . Alcohol use: No    Alcohol/week: 0.0 standard drinks  . Drug use: Never  . Sexual activity: Never  Other Topics Concern  . Not on file  Social History Narrative  . Not on file   Social Determinants of Health   Financial Resource Strain: Not on file  Food Insecurity: Not on file  Transportation Needs: Not on file  Physical Activity: Not on file  Stress: Not on file  Social Connections: Not on file  Intimate Partner Violence: Not on file   Family History  Problem Relation Age of Onset  . COPD Father   . Heart failure Father   . Heart disease Father   . Emphysema Father   . Arrhythmia Sister   . Arrhythmia Sister   . Arrhythmia Sister        had PPM also  . Cancer Sister     Objective: Office vital signs reviewed. BP (!) 151/59   Pulse (!) 48   Temp 98.2 F (36.8 C) (Temporal)   Ht 5\' 3"  (1.6 m)   Wt 223 lb (101.2 kg)   SpO2 97%   BMI 39.50 kg/m   Physical Examination:  General: Awake, alert, nontoxic, No acute distress HEENT: Normal; sclera white Cardio: regular rate and rhythm, S1S2 heard, soft systolic murmur appreciated Pulm: Slight decreased breath sounds throughout but otherwise clear to auscultation bilaterally, no wheezes, rhonchi or rales; normal work of breathing on room air Extremities: Trace edema to ankles on right with +1 edema to ankles on left.  Assessment/ Plan: 69 y.o. female   Hospital discharge follow-up  PAF (paroxysmal atrial fibrillation) ([place]) - Plan: Basic Metabolic Panel, CBC, Hemoglobin, fingerstick  Acute cystitis without hematuria - Plan: Basic Metabolic Panel  I reviewed her hospitalization, discharge summary and recommendations.  Because she was heparinized in the hospital, I am going to go ahead and check a  hemoglobin.  She has had so much trouble with GI bleeds that I just worry.  Her hemoglobin of note at discharge from the hospital was within normal range.  She has appropriate follow-up with cardiology on Friday for stress testing.  She seemed to be both rate and rhythm controlled during her visit today.  Check BMP to ensure electrolyte balance.  Overall she appears well-hydrated.  There is no evidence of fluid overload within her lungs but I have recommended increasing Lasix to twice daily given reports of feeling short of breath  and lower extremity edema.  She can use twice daily dosing for the next 2 days then resume once daily as needed.  Would continue only half dose of losartan given normotension on today's exam.  She understands threshold of 150/90 for blood pressure.  She will follow-up as needed  No orders of the defined types were placed in this encounter.  No orders of the defined types were placed in this encounter.    Janora Norlander, DO [place] [phone]

## 2020-09-05 LAB — CBC
Hematocrit: 34.4 % (ref 34.0–46.6)
Hemoglobin: 11.4 g/dL (ref 11.1–15.9)
MCH: 29.9 pg (ref 26.6–33.0)
MCHC: 33.1 g/dL (ref 31.5–35.7)
MCV: 90 fL (ref 79–97)
Platelets: 253 10*3/uL (ref 150–450)
RBC: 3.81 x10E6/uL (ref 3.77–5.28)
RDW: 15.1 % (ref 11.7–15.4)
WBC: 7.6 10*3/uL (ref 3.4–10.8)

## 2020-09-05 LAB — BASIC METABOLIC PANEL
BUN/Creatinine Ratio: 32 — ABNORMAL HIGH (ref 12–28)
BUN: 20 mg/dL (ref 8–27)
CO2: 20 mmol/L (ref 20–29)
Calcium: 9 mg/dL (ref 8.7–10.3)
Chloride: 102 mmol/L (ref 96–106)
Creatinine, Ser: 0.62 mg/dL (ref 0.57–1.00)
Glucose: 144 mg/dL — ABNORMAL HIGH (ref 65–99)
Potassium: 4.2 mmol/L (ref 3.5–5.2)
Sodium: 141 mmol/L (ref 134–144)
eGFR: 96 mL/min/{1.73_m2} (ref >59)

## 2020-09-06 ENCOUNTER — Encounter (HOSPITAL_COMMUNITY)
Admission: RE | Admit: 2020-09-06 | Discharge: 2020-09-06 | Disposition: A | Discharge status: Home / Self Care | Payer: Medicare Other | Source: Ambulatory Visit | Attending: Cardiology | Admitting: Cardiology

## 2020-09-06 ENCOUNTER — Encounter (HOSPITAL_COMMUNITY): Admission: RE | Admit: 2020-09-06 | Payer: Medicare Other | Source: Ambulatory Visit

## 2020-09-06 DIAGNOSIS — R778 Other specified abnormalities of plasma proteins: Secondary | ICD-10-CM | POA: Insufficient documentation

## 2020-09-09 ENCOUNTER — Other Ambulatory Visit: Payer: Self-pay

## 2020-09-09 DIAGNOSIS — I6523 Occlusion and stenosis of bilateral carotid arteries: Secondary | ICD-10-CM

## 2020-09-10 DIAGNOSIS — Z1231 Encounter for screening mammogram for malignant neoplasm of breast: Secondary | ICD-10-CM | POA: Diagnosis not present

## 2020-09-17 ENCOUNTER — Encounter (HOSPITAL_COMMUNITY): Payer: Self-pay

## 2020-09-17 ENCOUNTER — Encounter (HOSPITAL_COMMUNITY)
Admission: RE | Admit: 2020-09-17 | Discharge: 2020-09-17 | Disposition: A | Discharge status: Home / Self Care | Payer: Medicare Other | Source: Ambulatory Visit | Attending: Cardiology | Admitting: Cardiology

## 2020-09-17 ENCOUNTER — Ambulatory Visit (HOSPITAL_COMMUNITY)
Admission: RE | Admit: 2020-09-17 | Discharge: 2020-09-17 | Disposition: A | Discharge status: Home / Self Care | Payer: Medicare Other | Source: Ambulatory Visit | Attending: Cardiology | Admitting: Cardiology

## 2020-09-17 DIAGNOSIS — R778 Other specified abnormalities of plasma proteins: Secondary | ICD-10-CM | POA: Diagnosis not present

## 2020-09-17 DIAGNOSIS — Z79899 Other long term (current) drug therapy: Secondary | ICD-10-CM | POA: Diagnosis not present

## 2020-09-17 DIAGNOSIS — Z7901 Long term (current) use of anticoagulants: Secondary | ICD-10-CM | POA: Diagnosis not present

## 2020-09-17 DIAGNOSIS — I480 Paroxysmal atrial fibrillation: Secondary | ICD-10-CM | POA: Diagnosis not present

## 2020-09-17 DIAGNOSIS — R0789 Other chest pain: Secondary | ICD-10-CM | POA: Diagnosis not present

## 2020-09-17 DIAGNOSIS — I256 Silent myocardial ischemia: Secondary | ICD-10-CM

## 2020-09-17 LAB — NM MYOCAR MULTI W/SPECT W/WALL MOTION / EF
LV dias vol: 100 mL (ref 46–106)
LV sys vol: 39 mL
Peak HR: 84 {beats}/min
RATE: 0.7
Rest HR: 57 {beats}/min
SDS: 4
SRS: 1
SSS: 5
TID: 1.41

## 2020-09-17 MED ORDER — SODIUM CHLORIDE FLUSH 0.9 % IV SOLN
INTRAVENOUS | Status: AC
  Administered 2020-09-17: 10 mL via INTRAVENOUS
  Filled 2020-09-17: qty 10

## 2020-09-17 MED ORDER — TECHNETIUM TC 99M TETROFOSMIN IV KIT
10.0000 | PACK | Freq: Once | INTRAVENOUS | Status: AC | PRN
  Administered 2020-09-17: 9.7 via INTRAVENOUS

## 2020-09-17 MED ORDER — REGADENOSON 0.4 MG/5ML IV SOLN
INTRAVENOUS | Status: AC
  Administered 2020-09-17: 0.4 mg via INTRAVENOUS
  Filled 2020-09-17: qty 5

## 2020-09-17 MED ORDER — TECHNETIUM TC 99M TETROFOSMIN IV KIT
30.0000 | PACK | Freq: Once | INTRAVENOUS | Status: AC | PRN
  Administered 2020-09-17: 30.5 via INTRAVENOUS

## 2020-09-19 ENCOUNTER — Other Ambulatory Visit: Payer: Self-pay | Admitting: Cardiology

## 2020-09-20 ENCOUNTER — Other Ambulatory Visit: Payer: Self-pay | Admitting: Family Medicine

## 2020-09-29 NOTE — Progress Notes (Signed)
Cardiology Office Note   Date:  10/01/2020   ID:  Emily [name], DOB [birth year], MRN [id number]  PCP:  Janora Norlander, DO  Cardiologist:   Minus Breeding, MD   No chief complaint on file.     History of Present Illness: Emily [name] is a 70 y.o. female who presents for followup of atrial fib.   The patient has paroxysmal atrial fibrillation and was treated with flecainide. She has had recurrent GI bleeding and could not tolerate anticoagulation.  She was getting follow-up of her Watchman device at [facility].   In December she had PAF and called EMS.  She had GI bleeding again and she was in the hospital in April for this.  She has had a complete work-up and not been able to identify a source.  She has had transfusions.  She subsequently had double-balloon enteroscopy.   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  She did have Watchman.  She has moderate AS.     She was in the hospital for one day last month with atrial fib with RVR.  She spontaneously converted to NSR after IV Dilt.  She had a UTI.  She did have a troponin that went up to 1738.  Echocardiogram was essentially unremarkable except for the moderate left ventricular hypertrophy and aortic stenosis.  She has LVH as well.  There were no wall motion abnormalities.  She did have a stress perfusion study which suggested possibly fixed defect in the apex with some mild peri-infarct ischemia.  She said when she went home she did have a couple of episodes of atrial fibrillation a few days later once in the morning and once in the evening and she took an extra 30 mg of diltiazem and the extra half of flecainide and converted both times.  This was a month ago and she has since had no further atrial fibrillation.  She denies any chest pressure, neck or arm discomfort.  She is still cutting hair.  She denies any new shortness of breath, PND or orthopnea.  She has had no weight gain.  Has had some mild  increased edema and has had to take a little extra Lasix.  Hospital records reviewed for this visit  Past Medical History:  Diagnosis Date  . Anemia   . Arthritis   . Atrial fibrillation, rapid ([place]) 10/27/2013  . Bilateral carotid artery disease ([place])   . Diabetes mellitus without complication ([place])   . Difficult intubation    states 'lady that did the sleep study told me I have the smallest airway she has ever seen in an adult"  . Dyslipidemia 06/29/2017  . Dysrhythmia   . Hypertension   . Hypothyroid   . Obesity   . Obstructive sleep apnea    on C Pap  . PAF (paroxysmal atrial fibrillation) ([place])    a. newly dx in 09/2013; on eliquis    Past Surgical History:  Procedure Laterality Date  . CATARACT EXTRACTION Left   . CATARACT EXTRACTION W/PHACO Right 12/09/2015   Procedure: CATARACT EXTRACTION PHACO AND INTRAOCULAR LENS PLACEMENT (IOC);  Surgeon: Tonny Branch, MD;  Location: [facility];  Service: Ophthalmology;  Laterality: Right;  CDE:10.48  . COLONOSCOPY W/ POLYPECTOMY    . cyst on back of neck removed    . DILATION AND CURETTAGE OF UTERUS     x 5  . ENDARTERECTOMY Left 08/30/2015   Procedure: LEFT CAROYID ENDARTERECTOMY WITH XENOSURE BOVINE PERICARDIUM  PATCH ANGIOPLASTY;  Surgeon: [name], MD;  Location: [place];  Service: Vascular;  Laterality: Left;  . EYE SURGERY    . TONSILLECTOMY    . UPPER GI ENDOSCOPY       Current Outpatient Medications  Medication Sig Dispense Refill  . aspirin EC 81 MG tablet Take by mouth.    . diltiazem (CARDIZEM CD) 180 MG 24 hr capsule TAKE 1 CAPSULE BY MOUTH EVERY DAY 90 capsule 3  . ezetimibe (ZETIA) 10 MG tablet TAKE 1 TABLET BY MOUTH EVERY DAY 90 tablet 3  . flecainide (TAMBOCOR) 150 MG tablet TAKE 1/2 TABLETS (75 MG TOTAL) BY MOUTH 2 (TWO) TIMES DAILY. 90 tablet 3  . furosemide (LASIX) 20 MG tablet Take 1 tablet (20 mg total) by mouth daily as needed for fluid. 90 tablet 0  . glipiZIDE (GLUCOTROL) 5 MG tablet TAKE 1 TABLET BY MOUTH  TWICE A DAY 180 tablet 0  . JARDIANCE 25 MG TABS tablet TAKE 1 TABLET BY MOUTH DAILY BEFORE BREAKFAST 30 tablet 2  . ketoconazole (NIZORAL) 2 % cream Apply 1 application topically daily. Prn intertrigo rash 60 g 1  . levothyroxine (SYNTHROID) 112 MCG tablet TAKE 1 TABLET BY MOUTH EVERY OTHER DAY, ALTERNATING WITH 1 & 1/2 TABLETS EVERY OTHER DAY 112 tablet 3  . losartan (COZAAR) 25 MG tablet Take 0.5 tablets (12.5 mg total) by mouth daily.    Marland Kitchen lovastatin (MEVACOR) 40 MG tablet TAKE 1 TABLET BY MOUTH EVERY DAY IN THE EVENING 90 tablet 3  . metFORMIN (GLUCOPHAGE) 1000 MG tablet TAKE 1 TABLET (1,000 MG TOTAL) BY MOUTH 2 (TWO) TIMES DAILY WITH A MEAL. 180 tablet 0  . metoprolol succinate (TOPROL-XL) 100 MG 24 hr tablet Take 1 tablet (100 mg total) by mouth in the morning and at bedtime. Take with or immediately following a meal. 60 tablet 2  . rOPINIRole (REQUIP) 0.5 MG tablet TAKE 1 TABLET (0.5 MG TOTAL) BY MOUTH AT BEDTIME. 90 tablet 0   Current Facility-Administered Medications  Medication Dose Route Frequency Provider Last Rate Last Admin  . cyanocobalamin ((VITAMIN B-12)) injection 1,000 mcg  1,000 mcg Intramuscular Q30 days Timmothy Euler, MD   1,000 mcg at 08/27/20 1501    Allergies:   Quinapril hcl, Statins, and Tape   ROS:  Please see the history of present illness.   Otherwise, review of systems are positive for none.   All other systems are reviewed and negative.    PHYSICAL EXAM: VS:  There were no vitals taken for this visit. , BMI There is no height or weight on file to calculate BMI. GENERAL:  Well appearing NECK:  No jugular venous distention, waveform within normal limits, carotid upstroke brisk and symmetric, bilateral carotid bruits, no thyromegaly LUNGS:  Clear to auscultation bilaterally CHEST:  Unremarkable HEART:  PMI not displaced or sustained,S1 and S2 within normal limits, no S3, no S4, no clicks, no rubs, 3 out of 6 apical systolic murmur, early peaking, no  diastolic murmurs ABD:  Flat, positive bowel sounds normal in frequency in pitch, no bruits, no rebound, no guarding, no midline pulsatile mass, no hepatomegaly, no splenomegaly EXT:  2 plus pulses throughout, left greater than right leg mild edema, no cyanosis no clubbing  EKG:  EKG is not ordered today.   Recent Labs: 08/30/2020: ALT 24; Magnesium 2.0; TSH 0.802 09/04/2020: BUN 20; Creatinine, Ser 0.62; Hemoglobin 11.4; Platelets 253; Potassium 4.2; Sodium 141    Lipid Panel    Component  Value Date/Time   CHOL 145 09/08/2019 1015   TRIG 156 (H) 09/08/2019 1015   HDL 37 (L) 09/08/2019 1015   CHOLHDL 3.9 09/08/2019 1015   LDLCALC 81 09/08/2019 1015      Wt Readings from Last 3 Encounters:  09/04/20 223 lb (101.2 kg)  08/30/20 224 lb (101.6 kg)  08/20/20 221 lb (100.2 kg)      Other studies Reviewed: Additional studies/ records that were reviewed today include:  Hospital records Review of the above records demonstrates:  Please see elsewhere in the note.     ASSESSMENT AND PLAN:  ATRIAL FIB:   .  [name] has a CHA2DS2 - VASc score of 4.     I think this event was precipitated by a GI problem.  This is resolved and she has had no further symptoms.  No change in therapy.  If she feels further she might need to be switched to Tikosyn.  GI BLEEDING:  She had follow-up with [facility] and they found no active bleeding although they did see a previous bleeding site when they did double balloon enteroscopy.   HTN:   The blood pressure is elevated.  Her Cozaar has been reduced in the hospital.  I am going to go back up to 25 mg daily.  CAROTID STENOSIS:  She had 60 - 79% right stenosis and left 40 - 59% stenosis and this was done at [facility].   DM:  A1c was down to 6.2 from 6.8.  No change in therapy.   AS/LVH:   She had moderate AS.  I will follow this clinically.  It was moderate again on echo in the hospital.  ELEVATED TROP:   She had a low risk perfusion study.   She is not having any ongoing symptoms.  She had normal wall motion on echo.  No further invasive work-up is indicated.  Current medicines are reviewed at length with the patient today.  The patient does not have concerns regarding medicines.  The following changes have been made: As above  Labs/ tests ordered today include: None  No orders of the defined types were placed in this encounter.    Disposition:   FU with me in 6 months  Signed, Minus Breeding, MD  10/01/2020 1:35 PM    [facility]

## 2020-10-01 ENCOUNTER — Encounter: Payer: Self-pay | Admitting: Cardiology

## 2020-10-01 ENCOUNTER — Other Ambulatory Visit: Payer: Self-pay | Admitting: Family Medicine

## 2020-10-01 ENCOUNTER — Ambulatory Visit: Payer: Medicare Other | Admitting: Cardiology

## 2020-10-01 ENCOUNTER — Ambulatory Visit: Payer: Medicare Other | Admitting: Physician Assistant

## 2020-10-01 ENCOUNTER — Other Ambulatory Visit: Payer: Self-pay

## 2020-10-01 ENCOUNTER — Ambulatory Visit: Payer: Medicare Other | Admitting: Pharmacist

## 2020-10-01 ENCOUNTER — Ambulatory Visit (HOSPITAL_COMMUNITY)
Admission: RE | Admit: 2020-10-01 | Discharge: 2020-10-01 | Disposition: A | Discharge status: Home / Self Care | Payer: Medicare Other | Source: Ambulatory Visit | Attending: Vascular Surgery | Admitting: Vascular Surgery

## 2020-10-01 VITALS — BP 142/57 | HR 48 | Temp 98.3°F | Resp 20 | Ht 63.0 in

## 2020-10-01 VITALS — BP 140/60 | HR 51 | Ht 63.0 in | Wt 222.0 lb

## 2020-10-01 DIAGNOSIS — I6523 Occlusion and stenosis of bilateral carotid arteries: Secondary | ICD-10-CM | POA: Insufficient documentation

## 2020-10-01 DIAGNOSIS — I480 Paroxysmal atrial fibrillation: Secondary | ICD-10-CM | POA: Diagnosis not present

## 2020-10-01 DIAGNOSIS — E118 Type 2 diabetes mellitus with unspecified complications: Secondary | ICD-10-CM

## 2020-10-01 MED ORDER — LOSARTAN POTASSIUM 25 MG PO TABS: 25.0000 mg | ORAL_TABLET | Freq: Every day | ORAL | 3 refills | Status: DC

## 2020-10-01 NOTE — Progress Notes (Signed)
Carotid Artery Follow-Up   VASCULAR SURGERY ASSESSMENT & PLAN:   Emily [name] is a 70 y.o. female presents for routine follow-up. She is s/p left CEA by Dr. Trula Slade on 07/31/15 for asymptomatic left ICA stenosis.  Bilateral carotid artery stenosis: The patient has no symptoms referable to carotid artery stenosis.  Duplex examination today is stable as compared to 6 months ago.  We reviewed the signs and symptoms of stroke/TIA and advised the patient to call EMS should these occur.   Continue optimal medical management of diabetes, hypertension and follow-up with primary care physician.  She has an appointment today with her cardiologist. Does not use tobacco products. Continue the following medications: Aspirin, statin, Zetia Follow-up in 6 months with carotid duplex ultrasound.  SUBJECTIVE:   The patient denies monocular blindness, slurred speech, facial drooping, extremity weakness or numbness.  No lower extremity claudication or rest pain. She continues to work as a Haematologist. She was recently evaluated at in the hospital after a bout of gastrointestinal illness.  She states she has been in atrial fibrillation 3 times.  She has history of a prior Watchman procedure.  She also has a history of GI bleeding and anticoagulation is contraindicated. Has a history of diabetes mellitus and states her last A1c was 6.?%  PHYSICAL EXAM:   Vitals:   10/01/20 1151 10/01/20 1153  BP: (!) 115/54 (!) 142/57  Pulse: (!) 48   Resp: 20   Temp: 98.3 F (36.8 C)   TempSrc: Temporal   SpO2: 96%   Height: 5\' 3"  (1.6 m)     General appearance: Well-developed, well-nourished in no apparent distress Neurologic: Alert and oriented x4, tongue is midline, face symmetric, speech fluent, 5 out of 5 bilateral upper extremity grip strength, triceps and biceps strength Cardiovascular: Heart rate and rhythm are regular with systolic murmur.  Left artery bruit versus radiation of systolic murmur.  2+  symmetrical radial pulses. Respirations: Nonlabored    NON-INVASIVE VASCULAR STUDIES   10/01/2020 Right Carotid: Velocities in the right ICA are consistent with a 60-79% stenosis. The ECA appears <50% stenosed.   Left Carotid: Velocities in the left ICA are consistent with a 1-39% stenosis.   Vertebrals: Right vertebral artery flow is antegrade and stenotic. Left vertebral is antegrade and high resisitant, low velocity suggesting distal obstruction.   Subclavians: Normal flow hemodynamics were seen in bilateral subclavian arteries.   PROBLEM LIST:    The patient's past medical history, past surgical history, family history, social history, allergy list and medication list are reviewed.   CURRENT MEDS:    Current Outpatient Medications:  .  aspirin EC 81 MG tablet, Take by mouth., Disp: , Rfl:  .  diltiazem (CARDIZEM CD) 180 MG 24 hr capsule, TAKE 1 CAPSULE BY MOUTH EVERY DAY, Disp: 90 capsule, Rfl: 3 .  ezetimibe (ZETIA) 10 MG tablet, TAKE 1 TABLET BY MOUTH EVERY DAY, Disp: 90 tablet, Rfl: 3 .  flecainide (TAMBOCOR) 150 MG tablet, TAKE 1/2 TABLETS (75 MG TOTAL) BY MOUTH 2 (TWO) TIMES DAILY., Disp: 90 tablet, Rfl: 3 .  furosemide (LASIX) 20 MG tablet, Take 1 tablet (20 mg total) by mouth daily as needed for fluid., Disp: 90 tablet, Rfl: 0 .  glipiZIDE (GLUCOTROL) 5 MG tablet, TAKE 1 TABLET BY MOUTH TWICE A DAY, Disp: 180 tablet, Rfl: 0 .  JARDIANCE 25 MG TABS tablet, TAKE 1 TABLET BY MOUTH DAILY BEFORE BREAKFAST, Disp: 30 tablet, Rfl: 2 .  ketoconazole (NIZORAL) 2 % cream, Apply 1 application  topically daily. Prn intertrigo rash, Disp: 60 g, Rfl: 1 .  levothyroxine (SYNTHROID) 112 MCG tablet, TAKE 1 TABLET BY MOUTH EVERY OTHER DAY, ALTERNATING WITH 1 & 1/2 TABLETS EVERY OTHER DAY, Disp: 112 tablet, Rfl: 3 .  losartan (COZAAR) 25 MG tablet, Take 0.5 tablets (12.5 mg total) by mouth daily., Disp: , Rfl:  .  lovastatin (MEVACOR) 40 MG tablet, TAKE 1 TABLET BY MOUTH EVERY DAY IN THE  EVENING, Disp: 90 tablet, Rfl: 3 .  metFORMIN (GLUCOPHAGE) 1000 MG tablet, TAKE 1 TABLET (1,000 MG TOTAL) BY MOUTH 2 (TWO) TIMES DAILY WITH A MEAL., Disp: 180 tablet, Rfl: 0 .  metoprolol succinate (TOPROL-XL) 100 MG 24 hr tablet, Take 1 tablet (100 mg total) by mouth in the morning and at bedtime. Take with or immediately following a meal., Disp: 60 tablet, Rfl: 2 .  rOPINIRole (REQUIP) 0.5 MG tablet, TAKE 1 TABLET (0.5 MG TOTAL) BY MOUTH AT BEDTIME., Disp: 90 tablet, Rfl: 0  Current Facility-Administered Medications:  .  cyanocobalamin ((VITAMIN B-12)) injection 1,000 mcg, 1,000 mcg, Intramuscular, Q30 days, Timmothy Euler, MD, 1,000 mcg at 08/27/20 1501   REVIEW OF SYSTEMS:   [X]  denotes positive finding, [ ]  denotes negative finding Cardiac  Comments:  Chest pain or chest pressure:    Shortness of breath upon exertion:    Short of breath when lying flat:    Irregular heart rhythm:        Vascular    Pain in calf, thigh, or hip brought on by ambulation:    Pain in feet at night that wakes you up from your sleep:     Blood clot in your veins:    Leg swelling:         Pulmonary    Oxygen at home:    Productive cough:     Wheezing:         Neurologic    Sudden weakness in arms or legs:     Sudden numbness in arms or legs:     Sudden onset of difficulty speaking or slurred speech:    Temporary loss of vision in one eye:     Problems with dizziness:         Gastrointestinal    Blood in stool:     Vomited blood:         Genitourinary    Burning when urinating:     Blood in urine:        Psychiatric    Major depression:         Hematologic    Bleeding problems:    Problems with blood clotting too easily:        Skin    Rashes or ulcers:        Constitutional    Fever or chills:     Barbie Banner, PA-C  Office: [phone] 10/01/2020

## 2020-10-01 NOTE — Patient Instructions (Addendum)
Medication Instructions:  INCREASE- Losartan 25 mg by mouth daily  *If you need a refill on your cardiac medications before your next appointment, please call your pharmacy*   Lab Work: None Ordered   Testing/Procedures: None Ordered   Follow-Up: At [facility], you and your health needs are our priority.  As part of our continuing mission to provide you with exceptional heart care, we have created designated Provider Care Teams.  These Care Teams include your primary Cardiologist (physician) and Advanced Practice Providers (APPs -  Physician Assistants and Nurse Practitioners) who all work together to provide you with the care you need, when you need it.  We recommend signing up for the patient portal called "MyChart".  Sign up information is provided on this After Visit Summary.  MyChart is used to connect with patients for Virtual Visits (Telemedicine).  Patients are able to view lab/test results, encounter notes, upcoming appointments, etc.  Non-urgent messages can be sent to your provider as well.   To learn more about what you can do with MyChart, go to [email or web address].    Your next appointment:   6 month(s)  The format for your next appointment:   In Person  Provider:   You may see Minus Breeding, MD or one of the following Advanced Practice Providers on your designated Care Team:    [name], PA-C  Jory Sims, DNP, ANP

## 2020-10-03 ENCOUNTER — Other Ambulatory Visit: Payer: Self-pay

## 2020-10-03 DIAGNOSIS — I6523 Occlusion and stenosis of bilateral carotid arteries: Secondary | ICD-10-CM

## 2020-10-08 ENCOUNTER — Other Ambulatory Visit: Payer: Self-pay

## 2020-10-08 ENCOUNTER — Ambulatory Visit (INDEPENDENT_AMBULATORY_CARE_PROVIDER_SITE_OTHER): Payer: Medicare Other | Admitting: Pharmacist

## 2020-10-08 DIAGNOSIS — E119 Type 2 diabetes mellitus without complications: Secondary | ICD-10-CM

## 2020-10-08 DIAGNOSIS — E538 Deficiency of other specified B group vitamins: Secondary | ICD-10-CM

## 2020-10-08 NOTE — Progress Notes (Signed)
Cyanocobalamin injection given to right deltoid.  Patient tolerated well. 

## 2020-10-08 NOTE — Progress Notes (Signed)
    10/08/2020 Name: Emily [name] MRN: [id number] DOB: 1951-02-04   S:  65yoF Presents for diabetes evaluation, education, and management Patient was referred and last seen by Primary Care Provider on 09/04/20.  Insurance coverage/medication affordability: [employer] medicare  Patient reports adherence with medications. . Current diabetes medications include: jardiance,metformin, glipizide . Current hypertension medications include: dilt, losartan, metop Goal 130/80 . Current hyperlipidemia medications include: lovastatin  Patient denies hypoglycemic events.     O:  Lab Results  Component Value Date   HGBA1C 6.2 08/20/2020   Lipid Panel     Component Value Date/Time   CHOL 145 09/08/2019 1015   TRIG 156 (H) 09/08/2019 1015   HDL 37 (L) 09/08/2019 1015   CHOLHDL 3.9 09/08/2019 1015   LDLCALC 81 09/08/2019 1015    Home fasting blood sugars: <130  2 hour post-meal/random blood sugars: <180.    Clinical Atherosclerotic Cardiovascular Disease (ASCVD): Yes   The 10-year ASCVD risk score Mikey Bussing DC Jr., et al., 2013) is: 26.4%   Values used to calculate the score:     Age: 37 years     Sex: Female     Is Non-Hispanic African American: No     Diabetic: Yes     Tobacco smoker: No     Systolic Blood Pressure: 536 mmHg     Is BP treated: Yes     HDL Cholesterol: 37 mg/dL     Total Cholesterol: 145 mg/dL    A/P:  Diabetes T2DM currently controlled, however patient is experiencing high coays with Jardiance.   -Continue metformin  -Continue jardiance 25mg  daily-GFR 96  Application for [employer] patient assistance completed with patient (awaiting financials)  -Continue glipizide for now, however we will look as discontinuing this medication at follow up.  -Extensively discussed pathophysiology of diabetes, recommended lifestyle interventions, dietary effects on blood sugar control  -Counseled on s/sx of and management of hypoglycemia  Written patient instructions  provided.  Total time in face to face counseling 25 minutes.   Follow up PCP Clinic Visit ON 12/17/20.    Regina Eck, PharmD, BCPS Clinical Pharmacist, [place]  II Phone [phone]

## 2020-10-11 ENCOUNTER — Other Ambulatory Visit: Payer: Self-pay

## 2020-10-11 MED ORDER — LOVASTATIN 40 MG PO TABS: 40.0000 mg | ORAL_TABLET | Freq: Every evening | ORAL | 3 refills | Status: DC

## 2020-10-15 ENCOUNTER — Telehealth: Payer: Self-pay | Admitting: Pharmacist

## 2020-10-15 NOTE — Telephone Encounter (Signed)
Left message to call back  

## 2020-10-15 NOTE — Telephone Encounter (Signed)
Patient aware.

## 2020-10-15 NOTE — Telephone Encounter (Signed)
Please let patient know she has been approved through the [employer] patient assistance program for free jardiance (until 05/31/21)  Jardiance will be shipped to her home in 10 business days  Refills can be ordered by phone number on the bottle that comes in mail or by calling [phone]  Thank you!

## 2020-10-24 ENCOUNTER — Other Ambulatory Visit: Payer: Self-pay | Admitting: Family Medicine

## 2020-10-25 ENCOUNTER — Other Ambulatory Visit: Payer: Self-pay | Admitting: Family Medicine

## 2020-11-12 ENCOUNTER — Ambulatory Visit (INDEPENDENT_AMBULATORY_CARE_PROVIDER_SITE_OTHER): Payer: Medicare Other | Admitting: *Deleted

## 2020-11-12 ENCOUNTER — Other Ambulatory Visit: Payer: Self-pay

## 2020-11-12 DIAGNOSIS — E538 Deficiency of other specified B group vitamins: Secondary | ICD-10-CM | POA: Diagnosis not present

## 2020-11-12 NOTE — Progress Notes (Signed)
Pt given B12 injection IM left deltoid and tolerated well. °

## 2020-11-24 ENCOUNTER — Other Ambulatory Visit: Payer: Self-pay | Admitting: Family Medicine

## 2020-12-11 ENCOUNTER — Other Ambulatory Visit: Payer: Self-pay | Admitting: Family Medicine

## 2020-12-17 ENCOUNTER — Encounter: Payer: Self-pay | Admitting: Family Medicine

## 2020-12-17 ENCOUNTER — Ambulatory Visit (INDEPENDENT_AMBULATORY_CARE_PROVIDER_SITE_OTHER): Payer: Medicare Other | Admitting: Family Medicine

## 2020-12-17 ENCOUNTER — Other Ambulatory Visit: Payer: Self-pay

## 2020-12-17 VITALS — BP 146/63 | HR 49 | Temp 97.8°F | Ht 63.0 in | Wt 221.4 lb

## 2020-12-17 DIAGNOSIS — I152 Hypertension secondary to endocrine disorders: Secondary | ICD-10-CM | POA: Diagnosis not present

## 2020-12-17 DIAGNOSIS — E785 Hyperlipidemia, unspecified: Secondary | ICD-10-CM | POA: Diagnosis not present

## 2020-12-17 DIAGNOSIS — E1159 Type 2 diabetes mellitus with other circulatory complications: Secondary | ICD-10-CM

## 2020-12-17 DIAGNOSIS — I480 Paroxysmal atrial fibrillation: Secondary | ICD-10-CM | POA: Diagnosis not present

## 2020-12-17 DIAGNOSIS — E1169 Type 2 diabetes mellitus with other specified complication: Secondary | ICD-10-CM

## 2020-12-17 DIAGNOSIS — E538 Deficiency of other specified B group vitamins: Secondary | ICD-10-CM | POA: Diagnosis not present

## 2020-12-17 DIAGNOSIS — Z8719 Personal history of other diseases of the digestive system: Secondary | ICD-10-CM

## 2020-12-17 LAB — BAYER DCA HB A1C WAIVED: HB A1C (BAYER DCA - WAIVED): 6.4 % (ref <7.0)

## 2020-12-17 LAB — HEMOGLOBIN, FINGERSTICK: Hemoglobin: 12.5 g/dL (ref 11.1–15.9)

## 2020-12-17 NOTE — Progress Notes (Signed)
Subjective: CC: DM PCP: Janora Norlander, DO YTK:PTWSF Weidler is a 70 y.o. female presenting to clinic today for:  1. Type 2 Diabetes with hypertension, hyperlipidemia:  She reports that she has been doing well.  She is compliant with her medications.  Continues to experience some intermittent vaginal irritation due to the Jardiance but this is relieved by topical ketoconazole as needed.  No dysuria, pelvic pain, discoloration.  Last eye exam: UTD Last foot exam: needs Last A1c:  Lab Results  Component Value Date   HGBA1C 6.2 08/20/2020   Nephropathy screen indicated?: UTD Last flu, zoster and/or pneumovax:  Immunization History  Administered Date(s) Administered   Fluad Quad(high Dose 65+) 02/28/2019, 03/05/2020   Influenza, High Dose Seasonal PF 03/24/2016, 03/22/2018   Influenza, Seasonal, Injecte, Preservative Fre 06/05/2014, 07/02/2015   Influenza-Unspecified 03/24/2016   PFIZER(Purple Top)SARS-COV-2 Vaccination 07/10/2019, 08/03/2019, 03/13/2020   Pneumococcal Conjugate-13 07/09/2015   Pneumococcal Polysaccharide-23 01/30/2014, 02/28/2019   Tdap 05/26/2014    2. Afib w/ h.o GI bleed Has some fatigue but nothing compared to when she had active GI bleeding.  She is compliant with all medications.  Denies any visible rectal bleeding, vaginal bleeding or urinary bleeding.  No shortness of breath or dyspnea on exertion.  3.  B12 deficiency Patient needs to get her B12 shot.  Has some fatigability as above but otherwise does well.  Denies any sensory changes or balance issues  ROS: Per HPI  Allergies  Allergen Reactions   Quinapril Hcl Cough   Statins Other (See Comments)    Not all Statins but some cause cough and pain in legs.   Tape Other (See Comments)    Redness, please use "paper" tape   Past Medical History:  Diagnosis Date   Anemia    Arthritis    Atrial fibrillation, rapid ([place]) 10/27/2013   Bilateral carotid artery disease (HCC)    Diabetes  mellitus without complication ([place])    Difficult intubation    states 'lady that did the sleep study told me I have the smallest airway she has ever seen in an adult"   Dyslipidemia 06/29/2017   Dysrhythmia    Hypertension    Hypothyroid    Obesity    Obstructive sleep apnea    on C Pap   PAF (paroxysmal atrial fibrillation) ([place])    a. newly dx in 09/2013; on eliquis    Current Outpatient Medications:    aspirin EC 81 MG tablet, Take by mouth., Disp: , Rfl:    diltiazem (CARDIZEM CD) 180 MG 24 hr capsule, TAKE 1 CAPSULE BY MOUTH EVERY DAY, Disp: 90 capsule, Rfl: 3   ezetimibe (ZETIA) 10 MG tablet, TAKE 1 TABLET BY MOUTH EVERY DAY, Disp: 90 tablet, Rfl: 3   flecainide (TAMBOCOR) 150 MG tablet, TAKE 1/2 TABLETS (75 MG TOTAL) BY MOUTH 2 (TWO) TIMES DAILY., Disp: 90 tablet, Rfl: 3   furosemide (LASIX) 20 MG tablet, TAKE 1 TABLET (20 MG TOTAL) BY MOUTH DAILY AS NEEDED FOR FLUID., Disp: 90 tablet, Rfl: 0   glipiZIDE (GLUCOTROL) 5 MG tablet, TAKE 1 TABLET BY MOUTH TWICE A DAY, Disp: 180 tablet, Rfl: 0   JARDIANCE 25 MG TABS tablet, TAKE 1 TABLET BY MOUTH DAILY BEFORE BREAKFAST, Disp: 30 tablet, Rfl: 2   ketoconazole (NIZORAL) 2 % cream, Apply 1 application topically daily. Prn intertrigo rash, Disp: 60 g, Rfl: 1   levothyroxine (SYNTHROID) 112 MCG tablet, TAKE 1 TABLET BY MOUTH EVERY OTHER DAY, ALTERNATING WITH 1 & 1/2  TABLETS EVERY OTHER DAY, Disp: 112 tablet, Rfl: 3   losartan (COZAAR) 25 MG tablet, Take 1 tablet (25 mg total) by mouth daily., Disp: 90 tablet, Rfl: 3   lovastatin (MEVACOR) 40 MG tablet, Take 1 tablet (40 mg total) by mouth every evening., Disp: 90 tablet, Rfl: 3   metFORMIN (GLUCOPHAGE) 1000 MG tablet, TAKE 1 TABLET (1,000 MG TOTAL) BY MOUTH 2 (TWO) TIMES DAILY WITH A MEAL., Disp: 180 tablet, Rfl: 0   metoprolol succinate (TOPROL-XL) 100 MG 24 hr tablet, TAKE 1 TABLET BY MOUTH DAILY. TAKE WITH OR IMMEDIATELY FOLLOWING A MEAL., Disp: 90 tablet, Rfl: 3   rOPINIRole (REQUIP) 0.5  MG tablet, TAKE 1 TABLET BY MOUTH AT BEDTIME., Disp: 90 tablet, Rfl: 1  Current Facility-Administered Medications:    cyanocobalamin ((VITAMIN B-12)) injection 1,000 mcg, 1,000 mcg, Intramuscular, Q30 days, Timmothy Euler, MD, 1,000 mcg at 11/12/20 1436 Social History   Socioeconomic History   Marital status: Single    Spouse name: Not on file   Number of children: Not on file   Years of education: Not on file   Highest education level: Not on file  Occupational History   Not on file  Tobacco Use   Smoking status: Never   Smokeless tobacco: Never  Vaping Use   Vaping Use: Never used  Substance and Sexual Activity   Alcohol use: No    Alcohol/week: 0.0 standard drinks   Drug use: Never   Sexual activity: Never  Other Topics Concern   Not on file  Social History Narrative   Not on file   Social Determinants of Health   Financial Resource Strain: Not on file  Food Insecurity: Not on file  Transportation Needs: Not on file  Physical Activity: Not on file  Stress: Not on file  Social Connections: Not on file  Intimate Partner Violence: Not on file   Family History  Problem Relation Age of Onset   COPD Father    Heart failure Father    Heart disease Father    Emphysema Father    Arrhythmia Sister    Arrhythmia Sister    Arrhythmia Sister        had PPM also   Cancer Sister     Objective: Office vital signs reviewed. BP (!) 146/63   Pulse (!) 49   Temp 97.8 F (36.6 C)   Ht 5\' 3"  (1.6 m)   Wt 221 lb 6.4 oz (100.4 kg)   SpO2 95%   BMI 39.22 kg/m   Physical Examination:  General: Awake, alert, well nourished, No acute distress HEENT: Normal; sclera white Cardio: Mildly bradycardic with regular rhythm.  S1S2 heard, murmur appreciated. Pulm: clear to auscultation bilaterally, no wheezes, rhonchi or rales; normal work of breathing on room air Extremities: warm, well perfused, No edema, cyanosis or clubbing; +2 pulses bilaterally MSK: Ambulating  independently  Diabetic Foot Exam - Simple   Simple Foot Form Diabetic Foot exam was performed with the following findings: Yes 12/17/2020  3:56 PM  Visual Inspection No deformities, no ulcerations, no other skin breakdown bilaterally: Yes Sensation Testing Intact to touch and monofilament testing bilaterally: Yes Pulse Check Posterior Tibialis and Dorsalis pulse intact bilaterally: Yes Comments      Assessment/ Plan: 70 y.o. female   Controlled type 2 diabetes mellitus with other specified complication, without long-term current use of insulin ([place]) - Plan: Bayer DCA Hb A1c Waived  Hyperlipidemia associated with type 2 diabetes mellitus ([place])  Hypertension associated with diabetes (  HCC)  PAF (paroxysmal atrial fibrillation) (HCC)  History of GI bleed - Plan: Hemoglobin, fingerstick  Vitamin B12 deficiency  Sugar remains under excellent control.  Continue statin.  Not yet due for fasting lipid  Blood pressure controlled for age no changes  Seems both rate and rhythm controlled today.  No GI bleeding but hemoglobin was obtained to ensure stability and this was normal  B12 shot administered today.  Not experiencing any symptomatology of B12 deficiency   Orders Placed This Encounter  Procedures   Bayer DCA Hb A1c Waived   Hemoglobin, fingerstick   No orders of the defined types were placed in this encounter.    Janora Norlander, DO [place] [phone]

## 2020-12-22 DIAGNOSIS — R0602 Shortness of breath: Secondary | ICD-10-CM | POA: Diagnosis not present

## 2020-12-22 DIAGNOSIS — Z743 Need for continuous supervision: Secondary | ICD-10-CM | POA: Diagnosis not present

## 2020-12-22 DIAGNOSIS — I4891 Unspecified atrial fibrillation: Secondary | ICD-10-CM | POA: Diagnosis not present

## 2020-12-22 DIAGNOSIS — I499 Cardiac arrhythmia, unspecified: Secondary | ICD-10-CM | POA: Diagnosis not present

## 2020-12-22 DIAGNOSIS — M549 Dorsalgia, unspecified: Secondary | ICD-10-CM | POA: Diagnosis not present

## 2020-12-22 DIAGNOSIS — R404 Transient alteration of awareness: Secondary | ICD-10-CM | POA: Diagnosis not present

## 2020-12-22 DIAGNOSIS — R6889 Other general symptoms and signs: Secondary | ICD-10-CM | POA: Diagnosis not present

## 2020-12-22 DIAGNOSIS — R002 Palpitations: Secondary | ICD-10-CM | POA: Diagnosis not present

## 2021-01-07 DIAGNOSIS — Z0100 Encounter for examination of eyes and vision without abnormal findings: Secondary | ICD-10-CM | POA: Diagnosis not present

## 2021-01-07 DIAGNOSIS — E119 Type 2 diabetes mellitus without complications: Secondary | ICD-10-CM | POA: Diagnosis not present

## 2021-01-12 ENCOUNTER — Telehealth: Payer: Self-pay | Admitting: Internal Medicine

## 2021-01-12 NOTE — Telephone Encounter (Signed)
Received call tonight from Ms. Hufford regarding her atrial fibrillation.  Briefly, she has over 5 yr history of paroxysmal AF, currently treated with flecainide '75mg'$  BID + diltiazem and is s/p Watchman. She has had a few episodes of AF associated with palpitations and rate up to about 140 over the past few days. First episode 2 days ago spontaneously terminated, then again tonight after about 1 hour. Around 7pm tonight she felt palpitations recur, HR currently 100-130s. She is very minimally symptomatic without any chest pain, dyspnea, diaphoresis, or dizziness. Her baseline HR is around 55-60. I advised she take double her dose of flecainide tonight (total '150mg'$ , then return to '75mg'$  tomorrow) and discuss potential long-term medication changes or other options with Dr. Percival Spanish tomorrow. Call back if symptoms worsen or HR sustains > 150 bpm. She was in agreement with this plan.

## 2021-01-13 ENCOUNTER — Telehealth: Payer: Self-pay | Admitting: Cardiology

## 2021-01-13 NOTE — Telephone Encounter (Signed)
Spoke to patient she stated she has been having frequent episodes of afib.She called Dr. on call last night and was told to take a extra Flecainide dose.Stated she would like to see Dr.Hochrein before Oct.Appointment scheduled with Dr.Hochrein at [facility] location 8/18 at 4:30 pm.

## 2021-01-13 NOTE — Telephone Encounter (Signed)
   Patient c/o Palpitations:  High priority if patient c/o lightheadedness, shortness of breath, or chest pain  How long have you had palpitations/irregular HR/ Afib? Are you having the symptoms now?no   Are you currently experiencing lightheadedness, SOB or CP? No  Do you have a history of afib (atrial fibrillation) or irregular heart rhythm? Yes   Have you checked your BP or HR? (document readings if available): No  Are you experiencing any other symptoms?   Pt said she had Afib episode 4x for the last 3 weeks, he spoke with doctor on call Dr. Paralee Cancel (please see phone notes 01/12/21). She wanted to make sure Dr. Percival Spanish is aware she took additional dose of her flecanide. She wanted to check in if Dr. Percival Spanish would like to see her sooner as well

## 2021-01-15 NOTE — Progress Notes (Signed)
Cardiology Office Note   Date:  01/16/2021   ID:  Emily [name], DOB [birth year], MRN [id number]  PCP:  Emily Norlander, DO  Cardiologist:   Emily Breeding, MD   Chief Complaint  Patient presents with   Atrial Fibrillation       History of Present Illness: Emily [name] is a 70 y.o. female who presents for followup of atrial fib.   The patient has paroxysmal atrial fibrillation and was treated with flecainide. She has had recurrent GI bleeding and could not tolerate anticoagulation.  She was getting follow-up of her Watchman device at [facility].   In December she had PAF and called EMS.  She had GI bleeding again and she was in the hospital in April for this.  She has had a complete work-up and not been able to identify a source.  She has had transfusions.  She subsequently had double-balloon enteroscopy.   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  She did have Watchman.  She has moderate AS.     She had atrial fib with RVR.and was in the hospital in April.   She spontaneously converted to NSR after IV Dilt.  She had a UTI.  She did have a troponin that went up to 1738.  Echocardiogram was essentially unremarkable except for the moderate left ventricular hypertrophy and aortic stenosis.  She has LVH as well.  There were no wall motion abnormalities.  She did have a stress perfusion study which suggested possibly fixed defect in the apex with some mild peri-infarct ischemia.  Since I last saw her she has had multiple episodes of atrial fibrillation.  [place] out of rhythm.  She actually was on her way to the hospital when she went back into sinus rhythm.  This week she has had episodes Friday Sunday and again last night.  She typically takes an extra Cardizem.  She has taken an extra 75 mg of flecainide to break these episodes.  This morning she was back in atrial fibrillation.  She again took an extra 75 mg of flecainide and  went back to sinus rhythm by the appointment this afternoon.  She feels tired.  She feels her heart racing.  Makes her anxious.  She is not having any presyncope or syncope.  She is not having any new chest pressure, neck or arm discomfort.  She had no weight gain.  She has some mild chronic lower extremity swelling.   Past Medical History:  Diagnosis Date   Anemia    Arthritis    Atrial fibrillation, rapid ([place]) 10/27/2013   Bilateral carotid artery disease (HCC)    Diabetes mellitus without complication ([place])    Difficult intubation    states 'lady that did the sleep study told me I have the smallest airway she has ever seen in an adult"   Dyslipidemia 06/29/2017   Dysrhythmia    Hypertension    Hypothyroid    Obesity    Obstructive sleep apnea    on C Pap   PAF (paroxysmal atrial fibrillation) ([place])    a. newly dx in 09/2013; on eliquis    Past Surgical History:  Procedure Laterality Date   CATARACT EXTRACTION Left    CATARACT EXTRACTION W/PHACO Right 12/09/2015   Procedure: CATARACT EXTRACTION PHACO AND INTRAOCULAR LENS PLACEMENT ([place]);  Surgeon: Tonny Branch, MD;  Location: [facility];  Service: Ophthalmology;  Laterality: Right;  CDE:10.48  COLONOSCOPY W/ POLYPECTOMY     cyst on back of neck removed     DILATION AND CURETTAGE OF UTERUS     x 5   ENDARTERECTOMY Left 08/30/2015   Procedure: LEFT CAROYID ENDARTERECTOMY WITH XENOSURE BOVINE PERICARDIUM PATCH ANGIOPLASTY;  Surgeon: [name], MD;  Location: [facility];  Service: Vascular;  Laterality: Left;   EYE SURGERY     TONSILLECTOMY     UPPER GI ENDOSCOPY       Current Outpatient Medications  Medication Sig Dispense Refill   aspirin EC 81 MG tablet Take by mouth.     diltiazem (CARDIZEM CD) 180 MG 24 hr capsule TAKE 1 CAPSULE BY MOUTH EVERY DAY 90 capsule 3   ezetimibe (ZETIA) 10 MG tablet TAKE 1 TABLET BY MOUTH EVERY DAY 90 tablet 3   furosemide (LASIX) 20 MG tablet TAKE 1 TABLET (20 MG TOTAL) BY MOUTH DAILY AS NEEDED FOR  FLUID. 90 tablet 0   glipiZIDE (GLUCOTROL) 5 MG tablet TAKE 1 TABLET BY MOUTH TWICE A DAY 180 tablet 0   JARDIANCE 25 MG TABS tablet TAKE 1 TABLET BY MOUTH DAILY BEFORE BREAKFAST 30 tablet 2   ketoconazole (NIZORAL) 2 % cream Apply 1 application topically daily. Prn intertrigo rash 60 g 1   levothyroxine (SYNTHROID) 112 MCG tablet TAKE 1 TABLET BY MOUTH EVERY OTHER DAY, ALTERNATING WITH 1 & 1/2 TABLETS EVERY OTHER DAY 112 tablet 3   losartan (COZAAR) 25 MG tablet Take 1 tablet (25 mg total) by mouth daily. 90 tablet 3   lovastatin (MEVACOR) 40 MG tablet Take 1 tablet (40 mg total) by mouth every evening. 90 tablet 3   metFORMIN (GLUCOPHAGE) 1000 MG tablet TAKE 1 TABLET (1,000 MG TOTAL) BY MOUTH 2 (TWO) TIMES DAILY WITH A MEAL. 180 tablet 0   metoprolol succinate (TOPROL-XL) 100 MG 24 hr tablet TAKE 1 TABLET BY MOUTH DAILY. TAKE WITH OR IMMEDIATELY FOLLOWING A MEAL. 90 tablet 3   rOPINIRole (REQUIP) 0.5 MG tablet TAKE 1 TABLET BY MOUTH AT BEDTIME. 90 tablet 1   flecainide (TAMBOCOR) 100 MG tablet Take 1 tablet (100 mg total) by mouth 2 (two) times daily. 180 tablet 3   Current Facility-Administered Medications  Medication Dose Route Frequency Provider Last Rate Last Admin   cyanocobalamin ((VITAMIN B-12)) injection 1,000 mcg  1,000 mcg Intramuscular Q30 days Timmothy Euler, MD   1,000 mcg at 12/17/20 1321    Allergies:   Quinapril hcl, Statins, and Tape   ROS:  Please see the history of present illness.   Otherwise, review of systems are positive for none.   All other systems are reviewed and negative.    PHYSICAL EXAM: VS:  BP (!) 150/77   Pulse (!) 59   Ht 5' 2.5" (1.588 m)   Wt 219 lb (99.3 kg)   SpO2 97%   BMI 39.42 kg/m  , BMI Body mass index is 39.42 kg/m. GENERAL:  Well appearing NECK:  No jugular venous distention, waveform within normal limits, carotid upstroke brisk and symmetric, no bruits, no thyromegaly LUNGS:  Clear to auscultation bilaterally CHEST:   Unremarkable HEART:  PMI not displaced or sustained,S1 and S2 within normal limits, no S3, no S4, no clicks, no rubs, 3 out of 6 apical systolic murmur early peaking and radiating slightly at the aortic outflow tract, no diastolic murmurs ABD:  Flat, positive bowel sounds normal in frequency in pitch, no bruits, no rebound, no guarding, no midline pulsatile mass, no hepatomegaly, no splenomegaly  EXT:  2 plus pulses throughout, mild left greater than right edema, no cyanosis no clubbing   EKG:  EKG is  ordered today. Sinus rhythm, rate 56, axis within normal limits, intervals within normal limits, interventricular conduction delay.  Recent Labs: 08/30/2020: ALT 24; Magnesium 2.0; TSH 0.802 09/04/2020: BUN 20; Creatinine, Ser 0.62; Hemoglobin 11.4; Platelets 253; Potassium 4.2; Sodium 141    Lipid Panel    Component Value Date/Time   CHOL 145 09/08/2019 1015   TRIG 156 (H) 09/08/2019 1015   HDL 37 (L) 09/08/2019 1015   CHOLHDL 3.9 09/08/2019 1015   LDLCALC 81 09/08/2019 1015      Wt Readings from Last 3 Encounters:  01/16/21 219 lb (99.3 kg)  12/17/20 221 lb 6.4 oz (100.4 kg)  10/01/20 222 lb (100.7 kg)      Other studies Reviewed: Additional studies/ records that were reviewed today include: None. Review of the above records demonstrates:  NA   ASSESSMENT AND PLAN:  ATRIAL FIB:   .  Ms. Bernida Petko has a CHA2DS2 - VASc score of 4.    I am going to have her increase her flecainide to 100 mg twice daily.  I will check a basic metabolic profile and a magnesium level.  I think she should be considered for ablation and I will set her up for an EP visit.  GI BLEEDING:   Has had no further bleeding.  No change in therapy.   HTN:   The blood pressure is at target.  No change in therapy.    CAROTID STENOSIS:  She had 60 - 79% right stenosis and left 40 - 59% stenosis .  This was in May.    DM:  A1c was 6.4.   AS/LVH:   She had moderate AS.  I will follow I have advised her to  stop echo next year.    Current medicines are reviewed at length with the patient today.  The patient does not have concerns regarding medicines.  The following changes have been made: As above  Labs/ tests ordered today include:    Orders Placed This Encounter  Procedures   Basic Metabolic Panel (BMET)   Magnesium   Ambulatory referral to Cardiac Electrophysiology   EKG 12-Lead      Disposition:   FU with me after her EP evaluation.    Signed, Emily Breeding, MD  01/16/2021 5:26 PM    [place]

## 2021-01-16 ENCOUNTER — Other Ambulatory Visit: Payer: Self-pay

## 2021-01-16 ENCOUNTER — Encounter: Payer: Self-pay | Admitting: Cardiology

## 2021-01-16 ENCOUNTER — Ambulatory Visit: Payer: Medicare Other | Admitting: Cardiology

## 2021-01-16 ENCOUNTER — Telehealth: Payer: Self-pay | Admitting: Internal Medicine

## 2021-01-16 ENCOUNTER — Telehealth: Payer: Self-pay | Admitting: Cardiology

## 2021-01-16 VITALS — BP 150/77 | HR 59 | Ht 62.5 in | Wt 219.0 lb

## 2021-01-16 DIAGNOSIS — E118 Type 2 diabetes mellitus with unspecified complications: Secondary | ICD-10-CM

## 2021-01-16 DIAGNOSIS — I10 Essential (primary) hypertension: Secondary | ICD-10-CM | POA: Diagnosis not present

## 2021-01-16 DIAGNOSIS — I480 Paroxysmal atrial fibrillation: Secondary | ICD-10-CM

## 2021-01-16 DIAGNOSIS — I6523 Occlusion and stenosis of bilateral carotid arteries: Secondary | ICD-10-CM

## 2021-01-16 MED ORDER — FLECAINIDE ACETATE 100 MG PO TABS: 100.0000 mg | ORAL_TABLET | Freq: Two times a day (BID) | ORAL | 3 refills | Status: DC

## 2021-01-16 NOTE — Patient Instructions (Signed)
Medication Instructions:   INCREASE FLECAINIDE TO 100 MG TWICE DAILY  *If you need a refill on your cardiac medications before your next appointment, please call your pharmacy*   Lab Work:  Your physician recommends that you return for lab work AT [facility]  If you have labs (blood work) drawn today and your tests are completely normal, you will receive your results only by: MyChart Message (if you have MyChart) OR A paper copy in the mail If you have any lab test that is abnormal or we need to change your treatment, we will call you to review the results.   Follow-Up: At [facility], you and your health needs are our priority.  As part of our continuing mission to provide you with exceptional heart care, we have created designated Provider Care Teams.  These Care Teams include your primary Cardiologist (physician) and Advanced Practice Providers (APPs -  Physician Assistants and Nurse Practitioners) who all work together to provide you with the care you need, when you need it.  We recommend signing up for the patient portal called "MyChart".  Sign up information is provided on this After Visit Summary.  MyChart is used to connect with patients for Virtual Visits (Telemedicine).  Patients are able to view lab/test results, encounter notes, upcoming appointments, etc.  Non-urgent messages can be sent to your provider as well.   To learn more about what you can do with MyChart, go to [email or web address].    Your next appointment:   3 month(s)  The format for your next appointment:   In Person  Provider:   Minus Breeding, MD

## 2021-01-16 NOTE — Telephone Encounter (Signed)
Patient c/o Palpitations:  High priority if patient c/o lightheadedness, shortness of breath, or chest pain  How long have you had palpitations/irregular HR/ Afib? Are you having the symptoms now? Yes and off and on since last Friday the 01/10/21  Are you currently experiencing lightheadedness, SOB or CP? CP in back of her shoulder   Do you have a history of afib (atrial fibrillation) or irregular heart rhythm? yes  Have you checked your BP or HR? (document readings if available): HR 96  Are you experiencing any other symptoms? HR now it 117

## 2021-01-16 NOTE — Telephone Encounter (Signed)
Called by Emily [name] to report she was back in atrial fibrillation. Her heart rates were ranging between the 100-140s. She reported palpitations but no chest pain/SOB. Her blood pressures were stable. While we were chatting, she actually went back into sinus rhythm. She has taken a total of 225 mg of flecainide today + her long acting dilt + metoprolol. I advised her that she could take one additional 75 mg of flecainide if this recurred but no further doses. I also asked her to make sure she wears her CPAP this evening (has missed a few times recently). She will report to the ED with any significant change in her symptoms.

## 2021-01-16 NOTE — Telephone Encounter (Signed)
Received stat call into triage from patient stating that she is back in Atrial Fib. Patient states that she rolled over in bed this morning and she felt her heart rate go up. She reports her HR was in low 100's-140's. Currently on the phone with patient her HR is in the 70's, but states that it is jumping all over the place. Patient reports overall she feels well but states she has a discomfort in the back of her neck/shoulder that she states comes with the A-Fib. Patient denies any chest pain/radiating pain, shortness of breath, dizziness, or swelling. Patient would like to know if she can take an extra (1/2) tablet '75mg'$  total of Flecainide. Patient does have appointment with Dr. Percival Spanish this afternoon to discuss.   Spoke with DOD (Dr. Percival Spanish)- per Dr. Laureen Ochs for patient to take another (1/2) tablet 75 mg total Flecainide and monitor symptoms and come in this afternoon for appointment.   Made patient aware of Dr. Rosezella [place] recommendations and patient verbalized understanding. Advised patient that if she does feel symptomatic she should have someone drive her to her appointment this afternoon. Advised of ED precautions as well should new or worsening symptoms develop. Patient verbalized understanding of all instructions.

## 2021-01-17 ENCOUNTER — Other Ambulatory Visit: Payer: Medicare Other

## 2021-01-17 ENCOUNTER — Telehealth: Payer: Self-pay | Admitting: Cardiology

## 2021-01-17 DIAGNOSIS — I480 Paroxysmal atrial fibrillation: Secondary | ICD-10-CM | POA: Diagnosis not present

## 2021-01-17 NOTE — Telephone Encounter (Signed)
Patient c/o Palpitations:  High priority if patient c/o lightheadedness, shortness of breath, or chest pain  How long have you had palpitations/irregular HR/ Afib? Are you having the symptoms now?  Yes   Are you currently experiencing lightheadedness, SOB or CP? no  Do you have a history of afib (atrial fibrillation) or irregular heart rhythm? yes  Have you checked your BP or HR? (document readings if available): Patient has a pulse ox she uses to check her pulse. Readings just today were 100, 29, 64  Are you experiencing any other symptoms? Not really.   She saw Dr. Percival Spanish yesterday but would still like some other advice

## 2021-01-17 NOTE — Telephone Encounter (Signed)
Left message to call back if needed. The reading were good except the 29 . This reading was probably a false reading.

## 2021-01-17 NOTE — Telephone Encounter (Signed)
Patient return called. Patient anxious about heart fluctuating and she feels  heart going in an out of afib. She states this the most it has occurred at one singular time.   RN attempted to reassure patient.  Medication was just increase to 100 mg flecainide twice a day.  Appointment with 02/11/21 with Dr Curt Bears to discuss ablation.  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

## 2021-01-18 LAB — BASIC METABOLIC PANEL
BUN/Creatinine Ratio: 43 — ABNORMAL HIGH (ref 12–28)
BUN: 30 mg/dL — ABNORMAL HIGH (ref 8–27)
CO2: 23 mmol/L (ref 20–29)
Calcium: 8.6 mg/dL — ABNORMAL LOW (ref 8.7–10.3)
Chloride: 101 mmol/L (ref 96–106)
Creatinine, Ser: 0.69 mg/dL (ref 0.57–1.00)
Glucose: 187 mg/dL — ABNORMAL HIGH (ref 65–99)
Potassium: 4.4 mmol/L (ref 3.5–5.2)
Sodium: 138 mmol/L (ref 134–144)
eGFR: 93 mL/min/{1.73_m2} (ref >59)

## 2021-01-18 LAB — MAGNESIUM: Magnesium: 2.2 mg/dL (ref 1.6–2.3)

## 2021-01-21 ENCOUNTER — Ambulatory Visit (INDEPENDENT_AMBULATORY_CARE_PROVIDER_SITE_OTHER): Payer: Medicare Other | Admitting: *Deleted

## 2021-01-21 ENCOUNTER — Other Ambulatory Visit: Payer: Self-pay

## 2021-01-21 DIAGNOSIS — E538 Deficiency of other specified B group vitamins: Secondary | ICD-10-CM

## 2021-01-21 NOTE — Progress Notes (Signed)
Vitamin b12 injection given and patient tolerated well.  

## 2021-01-22 ENCOUNTER — Encounter: Payer: Self-pay | Admitting: *Deleted

## 2021-01-23 ENCOUNTER — Telehealth: Payer: Self-pay | Admitting: Cardiology

## 2021-01-23 NOTE — Telephone Encounter (Signed)
Patient c/o Palpitations:  High priority if patient c/o lightheadedness, shortness of breath, or chest pain  How long have you had palpitations/irregular HR/ Afib? Are you having the symptoms now?  Yes, since 7am this morning  Are you currently experiencing lightheadedness, SOB or CP? No to all  Do you have a history of afib (atrial fibrillation) or irregular heart rhythm? Yes  Have you checked your BP or HR? (document readings if available): No   Are you experiencing any other symptoms? HR 123 as of 12:35pm

## 2021-01-23 NOTE — Telephone Encounter (Signed)
Message routed to DOD to review and advise if PRN dilt '30mg'$  is OK to take - she has at home, but not on med list. Mentioned in last MD note, but not the dose.

## 2021-01-23 NOTE — Telephone Encounter (Signed)
Yes that is fine to take dilt 30 mg PRN

## 2021-01-23 NOTE — Telephone Encounter (Signed)
Spoke with patient of Dr. Percival Spanish who has known PAF She was seen on 01/16/21 She increased flecainide to '100mg'$  BID and heart went back in rhythm last Friday  Her heart has been out of rhythm since 7am today - HR ranges up to 70s/80s - 130s She has NO shortness of breath or other symptoms  She said she was told to take PRN diltiazem '30mg'$  if needed - not on med list  Will check with MD on advice regarding PRN meds

## 2021-01-24 ENCOUNTER — Other Ambulatory Visit: Payer: Self-pay | Admitting: Family Medicine

## 2021-01-24 NOTE — Telephone Encounter (Signed)
Minus Breeding, MD  Fidel Levy, RN Caller: Unspecified Wilburn Mylar, 12:34 PM) OK to call in the 30 mg prn    Returned call to patient who reports her AFib episode broke around 1pm yesterday. She did not take PRN meds. Added PRN dilt '30mg'$  to her med list

## 2021-02-11 ENCOUNTER — Telehealth: Payer: Self-pay | Admitting: Family Medicine

## 2021-02-11 ENCOUNTER — Other Ambulatory Visit: Payer: Self-pay

## 2021-02-11 ENCOUNTER — Encounter: Payer: Self-pay | Admitting: Cardiology

## 2021-02-11 ENCOUNTER — Ambulatory Visit: Payer: Medicare Other | Admitting: Cardiology

## 2021-02-11 VITALS — BP 128/70 | HR 55 | Ht 62.5 in | Wt 222.0 lb

## 2021-02-11 DIAGNOSIS — I4819 Other persistent atrial fibrillation: Secondary | ICD-10-CM

## 2021-02-11 DIAGNOSIS — I480 Paroxysmal atrial fibrillation: Secondary | ICD-10-CM

## 2021-02-11 NOTE — Patient Instructions (Signed)
Medication Instructions:  Your physician recommends that you continue on your current medications as directed. Please refer to the Current Medication list given to you today.  *If you need a refill on your cardiac medications before your next appointment, please call your pharmacy*   Lab Work: None ordered   Testing/Procedures: None ordered   Follow-Up: At [facility], you and your health needs are our priority.  As part of our continuing mission to provide you with exceptional heart care, we have created designated Provider Care Teams.  These Care Teams include your primary Cardiologist (physician) and Advanced Practice Providers (APPs -  Physician Assistants and Nurse Practitioners) who all work together to provide you with the care you need, when you need it.  Your next appointment:   3 month(s)  The format for your next appointment:   In Person  Provider:   Will Camnitz, MD    Thank you for choosing [facility]!!   Revere Maahs, RN [phone]     

## 2021-02-11 NOTE — Progress Notes (Signed)
Electrophysiology Office Note   Date:  02/11/2021   ID:  Emily [name], DOB September 26, 1950, MRN [id number]  PCP:  Janora Norlander, DO  Cardiologist: Hoch[place] Primary Electrophysiologist:  Kindrick Lankford Meredith Leeds, MD    Chief Complaint: Atrial fibrillation   History of Present Illness: Emily [name] is a 70 y.o. female who is being seen today for the evaluation of atrial fibrillation at the request of Minus Breeding, MD. Presenting today for electrophysiology evaluation.  She has a history significant for atrial fibrillation, diabetes, hypertension, hyperlipidemia, obstructive sleep apnea.  She is currently on flecainide.  She is unfortunately had GI bleeding and could not tolerate anticoagulation.  She has had a complete work-up, though no source was found.  She is status post watchman implant.  Unfortunately she is continued to have multiple episodes of atrial fibrillation.  She typically takes an extra dose of Cardizem and extra dose of flecainide when she has these episodes.  She has fatigue and palpitations associated with her episodes of atrial fibrillation.  She was having more frequent episodes of atrial fibrillation, her flecainide dose was increased.  She continued to have a few episodes at the beginning of August.  She feels palpitations, fatigue, and shortness of breath when she has atrial fibrillation.  Since the second Thursday in August, she has had no further episodes of atrial fibrillation.  She has felt well and is without major complaint.  She is overall happy with her control.  Today, she denies symptoms of palpitations, chest pain, shortness of breath, orthopnea, PND, lower extremity edema, claudication, dizziness, presyncope, syncope, bleeding, or neurologic sequela. The patient is tolerating medications without difficulties.    Past Medical History:  Diagnosis Date   Anemia    Arthritis    Atrial fibrillation, rapid ([place]) 10/27/2013   Bilateral carotid artery  disease (HCC)    Diabetes mellitus without complication ([place])    Difficult intubation    states 'lady that did the sleep study told me I have the smallest airway she has ever seen in an adult"   Dyslipidemia 06/29/2017   Dysrhythmia    Hypertension    Hypothyroid    Obesity    Obstructive sleep apnea    on C Pap   PAF (paroxysmal atrial fibrillation) ([place])    a. newly dx in 09/2013; on eliquis   Past Surgical History:  Procedure Laterality Date   CATARACT EXTRACTION Left    CATARACT EXTRACTION W/PHACO Right 12/09/2015   Procedure: CATARACT EXTRACTION PHACO AND INTRAOCULAR LENS PLACEMENT ([place]);  Surgeon: Tonny Branch, MD;  Location: [facility];  Service: Ophthalmology;  Laterality: Right;  CDE:10.48   COLONOSCOPY W/ POLYPECTOMY     cyst on back of neck removed     DILATION AND CURETTAGE OF UTERUS     x 5   ENDARTERECTOMY Left 08/30/2015   Procedure: LEFT CAROYID ENDARTERECTOMY WITH XENOSURE BOVINE PERICARDIUM PATCH ANGIOPLASTY;  Surgeon: [name], MD;  Location: [facility];  Service: Vascular;  Laterality: Left;   EYE SURGERY     TONSILLECTOMY     UPPER GI ENDOSCOPY       Current Outpatient Medications  Medication Sig Dispense Refill   aspirin EC 81 MG tablet Take by mouth.     diltiazem (CARDIZEM CD) 180 MG 24 hr capsule TAKE 1 CAPSULE BY MOUTH EVERY DAY 90 capsule 3   diltiazem (CARDIZEM) 30 MG tablet Take 30 mg by mouth as needed.     ezetimibe (ZETIA) 10 MG tablet TAKE 1  TABLET BY MOUTH EVERY DAY 90 tablet 3   flecainide (TAMBOCOR) 100 MG tablet Take 1 tablet (100 mg total) by mouth 2 (two) times daily. 180 tablet 3   furosemide (LASIX) 20 MG tablet TAKE 1 TABLET (20 MG TOTAL) BY MOUTH DAILY AS NEEDED FOR FLUID. 90 tablet 0   glipiZIDE (GLUCOTROL) 5 MG tablet TAKE 1 TABLET BY MOUTH TWICE A DAY 180 tablet 0   JARDIANCE 25 MG TABS tablet TAKE 1 TABLET BY MOUTH DAILY BEFORE BREAKFAST 30 tablet 2   ketoconazole (NIZORAL) 2 % cream Apply 1 application topically daily. Prn intertrigo  rash 60 g 1   levothyroxine (SYNTHROID) 112 MCG tablet TAKE 1 TABLET BY MOUTH EVERY OTHER DAY, ALTERNATING WITH 1 & 1/2 TABLETS EVERY OTHER DAY 112 tablet 3   losartan (COZAAR) 25 MG tablet Take 1 tablet (25 mg total) by mouth daily. 90 tablet 3   lovastatin (MEVACOR) 40 MG tablet Take 1 tablet (40 mg total) by mouth every evening. 90 tablet 3   metFORMIN (GLUCOPHAGE) 1000 MG tablet TAKE 1 TABLET (1,000 MG TOTAL) BY MOUTH 2 (TWO) TIMES DAILY WITH A MEAL. 180 tablet 0   metoprolol succinate (TOPROL-XL) 100 MG 24 hr tablet TAKE 1 TABLET BY MOUTH DAILY. TAKE WITH OR IMMEDIATELY FOLLOWING A MEAL. 90 tablet 3   rOPINIRole (REQUIP) 0.5 MG tablet TAKE 1 TABLET BY MOUTH AT BEDTIME. 90 tablet 1   Current Facility-Administered Medications  Medication Dose Route Frequency Provider Last Rate Last Admin   cyanocobalamin ((VITAMIN B-12)) injection 1,000 mcg  1,000 mcg Intramuscular Q30 days Timmothy Euler, MD   1,000 mcg at 01/21/21 1058    Allergies:   Quinapril hcl, Statins, and Tape   Social History:  The patient  reports that she has never smoked. She has never used smokeless tobacco. She reports that she does not drink alcohol and does not use drugs.   Family History:  The patient's family history includes Arrhythmia in her sister, sister, and sister; COPD in her father; Cancer in her sister; Emphysema in her father; Heart disease in her father; Heart failure in her father.    ROS:  Please see the history of present illness.   Otherwise, review of systems is positive for none.   All other systems are reviewed and negative.    PHYSICAL EXAM: VS:  BP 128/70   Pulse (!) 55   Ht 5' 2.5" (1.588 m)   Wt 222 lb (100.7 kg)   SpO2 96%   BMI 39.96 kg/m  , BMI Body mass index is 39.96 kg/m. GEN: Well nourished, well developed, in no acute distress  HEENT: normal  Neck: no JVD, carotid bruits, or masses Cardiac: RRR; 2 out of 6 systolic murmur at the base, no rubs, or gallops,no edema   Respiratory:  clear to auscultation bilaterally, normal work of breathing GI: soft, nontender, nondistended, + BS MS: no deformity or atrophy  Skin: warm and dry Neuro:  Strength and sensation are intact Psych: euthymic mood, full affect  EKG:  EKG is ordered today. Personal review of the ekg ordered shows sinus rhythm, rate 55  Recent Labs: 08/30/2020: ALT 24; TSH 0.802 09/04/2020: Hemoglobin 11.4; Platelets 253 01/17/2021: BUN 30; Creatinine, Ser 0.69; Magnesium 2.2; Potassium 4.4; Sodium 138    Lipid Panel     Component Value Date/Time   CHOL 145 09/08/2019 1015   TRIG 156 (H) 09/08/2019 1015   HDL 37 (L) 09/08/2019 1015   CHOLHDL 3.9 09/08/2019 1015  [place] 81 09/08/2019 1015     Wt Readings from Last 3 Encounters:  02/11/21 222 lb (100.7 kg)  01/16/21 219 lb (99.3 kg)  12/17/20 221 lb 6.4 oz (100.4 kg)      Other studies Reviewed: Additional studies/ records that were reviewed today include: TTE 08/30/20  Review of the above records today demonstrates:   1. Left ventricular ejection fraction, by estimation, is 60 to 65%. The  left ventricle has normal function. The left ventricle has no regional  wall motion abnormalities. There is moderate left ventricular hypertrophy.  Left ventricular diastolic  parameters are consistent with Grade II diastolic dysfunction  (pseudonormalization). Elevated left atrial pressure.   2. Right ventricular systolic function is normal. The right ventricular  size is normal.   3. Left atrial size was moderately dilated.   4. The pericardial effusion is circumferential.   5. The mitral valve is normal in structure. Trivial mitral valve  regurgitation. No evidence of mitral stenosis. Moderate mitral annular  calcification.   6. The aortic valve is tricuspid. There is moderate calcification of the  aortic valve. There is moderate thickening of the aortic valve. Aortic  valve regurgitation is not visualized. Moderate aortic valve stenosis.    7. The inferior vena cava is dilated in size with <50% respiratory  variability, suggesting right atrial pressure of 15 mmHg.   Myoview 09/17/2020 There was no ST segment deviation noted during stress. Findings consistent with prior distal anteior/apical myocardial infarction with mild peri-infarct ischemia. The left ventricular ejection fraction is normal (55-65%). Low risk based on perfusion imaging. Elevated TID 1.41 may suggest higher relative risk.  ASSESSMENT AND PLAN:  1.  Persistent atrial fibrillation: CHA2DS2-VASc of 4.  Currently on flecainide 100 mg twice daily.  Has a watchman in place and thus not anticoagulated.  Her flecainide was recently increased to 100 mg from 75 mg twice daily.  With the increase in her medication, she has not had any further episodes of atrial fibrillation in the last month.  She is overall comfortable with her control.  If she does have more frequent episodes of atrial flutter patient, she would be willing to further discuss ablation.  2.  GI bleeding: Has not been anticoagulated and has had no further bleeding.  3.  Carotid artery stenosis: Moderate stenosis on ultrasound.  Plan per primary cardiology.  4.  Moderate aortic stenosis: Has been stable on echoes.  Plan per primary cardiology.  Case discussed with primary cardiology  Current medicines are reviewed at length with the patient today.   The patient does not have concerns regarding her medicines.  The following changes were made today:  none  Labs/ tests ordered today include:  Orders Placed This Encounter  Procedures   EKG 12-Lead     Disposition:   FU with Breeanna Galgano 3 months  Signed, Bana Borgmeyer Meredith Leeds, MD  02/11/2021 11:42 AM     [place] [place] [address] [phone] (office) [phone] (fax)

## 2021-02-23 ENCOUNTER — Other Ambulatory Visit: Payer: Self-pay | Admitting: Cardiology

## 2021-02-25 ENCOUNTER — Other Ambulatory Visit: Payer: Self-pay

## 2021-02-25 ENCOUNTER — Ambulatory Visit (INDEPENDENT_AMBULATORY_CARE_PROVIDER_SITE_OTHER): Payer: Medicare Other | Admitting: *Deleted

## 2021-02-25 DIAGNOSIS — E538 Deficiency of other specified B group vitamins: Secondary | ICD-10-CM | POA: Diagnosis not present

## 2021-02-25 NOTE — Progress Notes (Signed)
Vitamin b12 injection given and patient tolerated well.  

## 2021-03-11 ENCOUNTER — Other Ambulatory Visit: Payer: Self-pay | Admitting: Family Medicine

## 2021-03-16 NOTE — Progress Notes (Signed)
Cardiology Office Note   Date:  03/18/2021   ID:  Emily [name], DOB May 23, 1951, MRN [id number]  PCP:  Janora Norlander, DO  Cardiologist:   Minus Breeding, MD   Chief Complaint  Patient presents with   Atrial Fibrillation      History of Present Illness: Emily [name] is a 70 y.o. female who presents for followup of atrial fib.   The patient has paroxysmal atrial fibrillation and was treated with flecainide. She has had recurrent GI bleeding and could not tolerate anticoagulation. She has had transfusions.  She subsequently had double-balloon enteroscopy.   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  She did have Watchman.  She has moderate AS.   She had atrial fib with RVR.and was in the hospital in April.   She spontaneously converted to NSR after IV Dilt.  She had a UTI.  She did have a troponin that went up to 1738.  Echocardiogram was essentially unremarkable except for the moderate left ventricular hypertrophy and aortic stenosis.  She has LVH as well.  There were no wall motion abnormalities.  She did have a stress perfusion study which suggested possibly fixed defect in the apex with some mild peri-infarct ischemia.  We managed this medically.  She has required increased doses of flecainide for management of her atrial fib.    She had 1 episode of fibrillation shortly after I saw her and increased flecainide but since then she has had no further episodes.  She is still working as a Theme park manager.  She denies any new shortness of breath, PND or orthopnea.  She has had no new palpitations, presyncope or syncope.  She has no chest pressure, neck or arm discomfort.   Past Medical History:  Diagnosis Date   Anemia    Arthritis    Atrial fibrillation, rapid ([place]) 10/27/2013   Bilateral carotid artery disease (HCC)    Diabetes mellitus without complication ([place])    Difficult intubation    states 'lady that did the sleep study told me I have the  smallest airway she has ever seen in an adult"   Dyslipidemia 06/29/2017   Dysrhythmia    Hypertension    Hypothyroid    Obesity    Obstructive sleep apnea    on C Pap   PAF (paroxysmal atrial fibrillation) ([place])    a. newly dx in 09/2013; on eliquis    Past Surgical History:  Procedure Laterality Date   CATARACT EXTRACTION Left    CATARACT EXTRACTION W/PHACO Right 12/09/2015   Procedure: CATARACT EXTRACTION PHACO AND INTRAOCULAR LENS PLACEMENT ([place]);  Surgeon: Tonny Branch, MD;  Location: [facility];  Service: Ophthalmology;  Laterality: Right;  CDE:10.48   COLONOSCOPY W/ POLYPECTOMY     cyst on back of neck removed     DILATION AND CURETTAGE OF UTERUS     x 5   ENDARTERECTOMY Left 08/30/2015   Procedure: LEFT CAROYID ENDARTERECTOMY WITH XENOSURE BOVINE PERICARDIUM PATCH ANGIOPLASTY;  Surgeon: [name], MD;  Location: [facility];  Service: Vascular;  Laterality: Left;   EYE SURGERY     TONSILLECTOMY     UPPER GI ENDOSCOPY       Current Outpatient Medications  Medication Sig Dispense Refill   aspirin EC 81 MG tablet Take by mouth.     diltiazem (CARDIZEM CD) 180 MG 24 hr capsule TAKE 1 CAPSULE BY MOUTH EVERY DAY 90 capsule 3   diltiazem (CARDIZEM) 30 MG tablet Take  30 mg by mouth as needed.     ezetimibe (ZETIA) 10 MG tablet TAKE 1 TABLET BY MOUTH EVERY DAY 90 tablet 3   flecainide (TAMBOCOR) 100 MG tablet Take 1 tablet (100 mg total) by mouth 2 (two) times daily. 180 tablet 3   furosemide (LASIX) 20 MG tablet TAKE 1 TABLET (20 MG TOTAL) BY MOUTH DAILY AS NEEDED FOR FLUID. 90 tablet 0   glipiZIDE (GLUCOTROL) 5 MG tablet TAKE 1 TABLET BY MOUTH TWICE A DAY 180 tablet 0   JARDIANCE 25 MG TABS tablet TAKE 1 TABLET BY MOUTH DAILY BEFORE BREAKFAST 30 tablet 2   ketoconazole (NIZORAL) 2 % cream Apply 1 application topically daily. Prn intertrigo rash 60 g 1   levothyroxine (SYNTHROID) 112 MCG tablet TAKE 1 TABLET BY MOUTH EVERY OTHER DAY, ALTERNATING WITH 1 & 1/2 TABLETS EVERY OTHER DAY 112  tablet 3   losartan (COZAAR) 25 MG tablet Take 1 tablet (25 mg total) by mouth daily. 90 tablet 3   lovastatin (MEVACOR) 40 MG tablet Take 1 tablet (40 mg total) by mouth every evening. 90 tablet 3   metFORMIN (GLUCOPHAGE) 1000 MG tablet TAKE 1 TABLET (1,000 MG TOTAL) BY MOUTH 2 (TWO) TIMES DAILY WITH A MEAL. 180 tablet 0   metoprolol succinate (TOPROL-XL) 100 MG 24 hr tablet TAKE 1 TABLET BY MOUTH DAILY. TAKE WITH OR IMMEDIATELY FOLLOWING A MEAL. 90 tablet 3   rOPINIRole (REQUIP) 0.5 MG tablet TAKE 1 TABLET BY MOUTH AT BEDTIME. 90 tablet 1   Current Facility-Administered Medications  Medication Dose Route Frequency Provider Last Rate Last Admin   cyanocobalamin ((VITAMIN B-12)) injection 1,000 mcg  1,000 mcg Intramuscular Q30 days Timmothy Euler, MD   1,000 mcg at 02/25/21 1017    Allergies:   Quinapril hcl, Statins, and Tape   ROS:  Please see the history of present illness.   Otherwise, review of systems are positive for none.   All other systems are reviewed and negative.    PHYSICAL EXAM: VS:  BP (!) 150/72   Pulse (!) 55   Ht 5\' 2"  (1.575 m)   Wt 222 lb 9.6 oz (101 kg)   SpO2 95%   BMI 40.71 kg/m  , BMI Body mass index is 40.71 kg/m. GENERAL:  Well appearing NECK:  No jugular venous distention, waveform within normal limits, carotid upstroke brisk and symmetric, no bruits, no thyromegaly LUNGS:  Clear to auscultation bilaterally CHEST:  Unremarkable HEART:  PMI not displaced or sustained,S1 and S2 within normal limits, no S3, no S4, no clicks, no rubs, no murmurs ABD:  Flat, positive bowel sounds normal in frequency in pitch, no bruits, no rebound, no guarding, no midline pulsatile mass, 3 of 6 apical systolic murmur radiating out the aortic outflow tract hepatomegaly, no splenomegaly EXT:  2 plus pulses throughout, no edema, no cyanosis no clubbing   EKG:  EKG is   ordered today. Sinus rhythm, rate 55, axis within normal limits, intervals within normal limits,  interventricular conduction delay.  Recent Labs: 08/30/2020: ALT 24; TSH 0.802 09/04/2020: Hemoglobin 11.4; Platelets 253 01/17/2021: BUN 30; Creatinine, Ser 0.69; Magnesium 2.2; Potassium 4.4; Sodium 138    Lipid Panel    Component Value Date/Time   CHOL 145 09/08/2019 1015   TRIG 156 (H) 09/08/2019 1015   HDL 37 (L) 09/08/2019 1015   CHOLHDL 3.9 09/08/2019 1015   LDLCALC 81 09/08/2019 1015      Wt Readings from Last 3 Encounters:  03/18/21 222 lb  9.6 oz (101 kg)  02/11/21 222 lb (100.7 kg)  01/16/21 219 lb (99.3 kg)      Other studies Reviewed: Additional studies/ records that were reviewed today include: Labs. Review of the above records demonstrates: See elsewhere   ASSESSMENT AND PLAN:  ATRIAL FIB:   .  Emily [name] has a CHA2DS2 - VASc score of 4.   She is up-to-date with blood work.  She tolerates anticoagulation.  She is had no symptomatic significant paroxysms.  GI BLEEDING:   She has had no further GI bleeding.  No change in therapy.    HTN:   The blood pressure is controlled.  No change in therapy.   CAROTID STENOSIS:  She had 60 - 79% right stenosis and left 40 - 59% stenosis .  This was in May of 2022 and has been stable over multiple scans. She is followed at [facility].    DM:  A1c was 6.4.  No change in therapy.   AS/LVH:   She had moderate AS on echo in April.  I will follow-up this April.   Current medicines are reviewed at length with the patient today.  The patient does not have concerns regarding medicines.  The following changes have been made: None  Labs/ tests ordered today include:  None  No orders of the defined types were placed in this encounter.     Disposition:   FU with me in six months.  Signed, Minus Breeding, MD  03/18/2021 2:25 PM    [facility]

## 2021-03-18 ENCOUNTER — Ambulatory Visit: Payer: Medicare Other | Admitting: Cardiology

## 2021-03-18 ENCOUNTER — Telehealth: Payer: Self-pay | Admitting: Family Medicine

## 2021-03-18 ENCOUNTER — Encounter: Payer: Self-pay | Admitting: Cardiology

## 2021-03-18 ENCOUNTER — Other Ambulatory Visit: Payer: Self-pay

## 2021-03-18 VITALS — BP 150/72 | HR 55 | Ht 62.0 in | Wt 222.6 lb

## 2021-03-18 DIAGNOSIS — I350 Nonrheumatic aortic (valve) stenosis: Secondary | ICD-10-CM | POA: Diagnosis not present

## 2021-03-18 DIAGNOSIS — I6523 Occlusion and stenosis of bilateral carotid arteries: Secondary | ICD-10-CM | POA: Diagnosis not present

## 2021-03-18 DIAGNOSIS — I480 Paroxysmal atrial fibrillation: Secondary | ICD-10-CM | POA: Diagnosis not present

## 2021-03-18 DIAGNOSIS — I10 Essential (primary) hypertension: Secondary | ICD-10-CM | POA: Diagnosis not present

## 2021-03-18 NOTE — Patient Instructions (Signed)
Medication Instructions:  The current medical regimen is effective;  continue present plan and medications.  *If you need a refill on your cardiac medications before your next appointment, please call your pharmacy*   Testing/Procedures: Echocardiogram - Your physician has requested that you have an echocardiogram. Echocardiography is a painless test that uses sound waves to create images of your heart. It provides your doctor with information about the size and shape of your heart and how well your heart's chambers and valves are working. This procedure takes approximately one hour. There are no restrictions for this procedure. This will be performed at our [facility] location - [address], [address].    Follow-Up: At [facility], you and your health needs are our priority.  As part of our continuing mission to provide you with exceptional heart care, we have created designated Provider Care Teams.  These Care Teams include your primary Cardiologist (physician) and Advanced Practice Providers (APPs -  Physician Assistants and Nurse Practitioners) who all work together to provide you with the care you need, when you need it.  We recommend signing up for the patient portal called "MyChart".  Sign up information is provided on this After Visit Summary.  MyChart is used to connect with patients for Virtual Visits (Telemedicine).  Patients are able to view lab/test results, encounter notes, upcoming appointments, etc.  Non-urgent messages can be sent to your provider as well.   To learn more about what you can do with MyChart, go to [email or web address].    Your next appointment:   6 month(s)  The format for your next appointment:   In Person  Provider:   Minus Breeding, MD

## 2021-03-18 NOTE — Telephone Encounter (Signed)
Is this something you help with ?

## 2021-03-19 ENCOUNTER — Telehealth: Payer: Self-pay

## 2021-03-19 NOTE — Chronic Care Management (AMB) (Signed)
  Chronic Care Management   Note  03/19/2021 Name: Emily [name] MRN: [id number] DOB: December 13, 1950  Emily [name] is a 70 y.o. year old female who is a primary care patient of Janora Norlander, DO. I reached out to [employer] by phone today in response to a referral sent by [name]'s PCP.  [name] was given information about Chronic Care Management services today including:  CCM service includes personalized support from designated clinical staff supervised by her physician, including individualized plan of care and coordination with other care providers 24/7 contact phone numbers for assistance for urgent and routine care needs. Service will only be billed when office clinical staff spend 20 minutes or more in a month to coordinate care. Only one practitioner may furnish and bill the service in a calendar month. The patient may stop CCM services at any time (effective at the end of the month) by phone call to the office staff. The patient is responsible for co-pay (up to 20% after annual deductible is met) if co-pay is required by the individual health plan.   Patient agreed to services and verbal consent obtained.   Follow up plan: Telephone appointment with care management team member scheduled for:04/22/2021  Noreene Larsson, [place], [place], [place] [address] Direct Dial: [phone] [email or web address] Website: [email or web address]

## 2021-03-19 NOTE — Telephone Encounter (Signed)
Pt has been scheduled.  °

## 2021-03-20 NOTE — Telephone Encounter (Signed)
Patient has scheduled f/u with PharmD No further action req'd  Thank you!

## 2021-03-27 ENCOUNTER — Other Ambulatory Visit: Payer: Self-pay | Admitting: Family Medicine

## 2021-04-01 ENCOUNTER — Other Ambulatory Visit: Payer: Self-pay

## 2021-04-01 ENCOUNTER — Ambulatory Visit: Payer: Medicare Other | Admitting: Physician Assistant

## 2021-04-01 ENCOUNTER — Ambulatory Visit: Payer: Medicare Other

## 2021-04-01 ENCOUNTER — Ambulatory Visit (HOSPITAL_COMMUNITY)
Admission: RE | Admit: 2021-04-01 | Discharge: 2021-04-01 | Disposition: A | Discharge status: Home / Self Care | Payer: Medicare Other | Source: Ambulatory Visit | Attending: Vascular Surgery | Admitting: Vascular Surgery

## 2021-04-01 ENCOUNTER — Ambulatory Visit (INDEPENDENT_AMBULATORY_CARE_PROVIDER_SITE_OTHER): Payer: Medicare Other

## 2021-04-01 VITALS — BP 159/62 | HR 51 | Temp 97.9°F | Resp 20 | Ht 62.0 in | Wt 227.8 lb

## 2021-04-01 DIAGNOSIS — E538 Deficiency of other specified B group vitamins: Secondary | ICD-10-CM

## 2021-04-01 DIAGNOSIS — I6523 Occlusion and stenosis of bilateral carotid arteries: Secondary | ICD-10-CM

## 2021-04-01 DIAGNOSIS — I6521 Occlusion and stenosis of right carotid artery: Secondary | ICD-10-CM

## 2021-04-01 DIAGNOSIS — Z9889 Other specified postprocedural states: Secondary | ICD-10-CM | POA: Diagnosis not present

## 2021-04-01 NOTE — Progress Notes (Signed)
Office Note     CC:  follow up Requesting Provider:  Janora Norlander, DO  HPI: Emily [name] is a 70 y.o. (1950/08/14) female who presents for follow up of carotid stenosis. She has history of left CEA in March of 2017 by Dr. Trula Slade for high grade asymptomatic stenosis. She has no history of TIA or Stroke. She has done well since her surgery and has been without any symptoms. We have been continuing to follow her every 6 months for right ICA stenosis of 60-79%.   Today she reports she has been doing well since her last visit. Her medical history is unchanged. She denies any amaurosis fugax or other visual changes, slurred speech, facial drooping, unilateral upper or lower extremity weakness or numbness. She has no lower extremity claudication, rest pain or tissue loss. 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 She is also managed on Zetia  The pt is on a daily aspirin.   Other AC:  none The pt is on BB, CCB,diuretic, ARB for hypertension.   The pt is diabetic. A1C in July was 6.4 Tobacco hx:  Never  Past Medical History:  Diagnosis Date   Anemia    Arthritis    Atrial fibrillation, rapid ([place]) 10/27/2013   Bilateral carotid artery disease (HCC)    Diabetes mellitus without complication ([place])    Difficult intubation    states 'lady that did the sleep study told me I have the smallest airway she has ever seen in an adult"   Dyslipidemia 06/29/2017   Dysrhythmia    Hypertension    Hypothyroid    Obesity    Obstructive sleep apnea    on C Pap   PAF (paroxysmal atrial fibrillation) ([place])    a. newly dx in 09/2013; on eliquis    Past Surgical History:  Procedure Laterality Date   CATARACT EXTRACTION Left    CATARACT EXTRACTION W/PHACO Right 12/09/2015   Procedure: CATARACT EXTRACTION PHACO AND INTRAOCULAR LENS PLACEMENT ([place]);  Surgeon: Tonny Branch, MD;  Location: [facility];  Service: Ophthalmology;  Laterality: Right;  CDE:10.48    COLONOSCOPY W/ POLYPECTOMY     cyst on back of neck removed     DILATION AND CURETTAGE OF UTERUS     x 5   ENDARTERECTOMY Left 08/30/2015   Procedure: LEFT CAROYID ENDARTERECTOMY WITH XENOSURE BOVINE PERICARDIUM PATCH ANGIOPLASTY;  Surgeon: [name], MD;  Location: [facility];  Service: Vascular;  Laterality: Left;   EYE SURGERY     TONSILLECTOMY     UPPER GI ENDOSCOPY      Social History   Socioeconomic History   Marital status: Single    Spouse name: Not on file   Number of children: Not on file   Years of education: Not on file   Highest education level: Not on file  Occupational History   Not on file  Tobacco Use   Smoking status: Never   Smokeless tobacco: Never  Vaping Use   Vaping Use: Never used  Substance and Sexual Activity   Alcohol use: No    Alcohol/week: 0.0 standard drinks   Drug use: Never   Sexual activity: Never  Other Topics Concern   Not on file  Social History Narrative   Not on file   Social Determinants of Health   Financial Resource Strain: Not on file  Food Insecurity: Not on file  Transportation Needs: Not on file  Physical Activity: Not  on file  Stress: Not on file  Social Connections: Not on file  Intimate Partner Violence: Not on file    Family History  Problem Relation Age of Onset   COPD Father    Heart failure Father    Heart disease Father    Emphysema Father    Arrhythmia Sister    Arrhythmia Sister    Arrhythmia Sister        had PPM also   Cancer Sister     Current Outpatient Medications  Medication Sig Dispense Refill   aspirin EC 81 MG tablet Take by mouth.     diltiazem (CARDIZEM CD) 180 MG 24 hr capsule TAKE 1 CAPSULE BY MOUTH EVERY DAY 90 capsule 3   diltiazem (CARDIZEM) 30 MG tablet Take 30 mg by mouth as needed.     ezetimibe (ZETIA) 10 MG tablet TAKE 1 TABLET BY MOUTH EVERY DAY 90 tablet 3   flecainide (TAMBOCOR) 100 MG tablet Take 1 tablet (100 mg total) by mouth 2 (two) times daily. 180 tablet 3    furosemide (LASIX) 20 MG tablet TAKE 1 TABLET (20 MG TOTAL) BY MOUTH DAILY AS NEEDED FOR FLUID. 90 tablet 0   glipiZIDE (GLUCOTROL) 5 MG tablet TAKE 1 TABLET BY MOUTH TWICE A DAY 180 tablet 0   JARDIANCE 25 MG TABS tablet TAKE 1 TABLET BY MOUTH DAILY BEFORE BREAKFAST 30 tablet 2   ketoconazole (NIZORAL) 2 % cream Apply 1 application topically daily. Prn intertrigo rash 60 g 1   levothyroxine (SYNTHROID) 112 MCG tablet TAKE 1 TABLET BY MOUTH EVERY OTHER DAY, ALTERNATING WITH 1 & 1/2 TABLETS EVERY OTHER DAY 112 tablet 0   losartan (COZAAR) 25 MG tablet Take 1 tablet (25 mg total) by mouth daily. 90 tablet 3   lovastatin (MEVACOR) 40 MG tablet Take 1 tablet (40 mg total) by mouth every evening. 90 tablet 3   metFORMIN (GLUCOPHAGE) 1000 MG tablet TAKE 1 TABLET (1,000 MG TOTAL) BY MOUTH 2 (TWO) TIMES DAILY WITH A MEAL. 180 tablet 0   metoprolol succinate (TOPROL-XL) 100 MG 24 hr tablet TAKE 1 TABLET BY MOUTH DAILY. TAKE WITH OR IMMEDIATELY FOLLOWING A MEAL. 90 tablet 3   rOPINIRole (REQUIP) 0.5 MG tablet TAKE 1 TABLET BY MOUTH AT BEDTIME. 90 tablet 1   Current Facility-Administered Medications  Medication Dose Route Frequency Provider Last Rate Last Admin   cyanocobalamin ((VITAMIN B-12)) injection 1,000 mcg  1,000 mcg Intramuscular Q30 days Timmothy Euler, MD   1,000 mcg at 04/01/21 9562    Allergies  Allergen Reactions   Quinapril Hcl Cough   Statins Other (See Comments)    Not all Statins but some cause cough and pain in legs.   Tape Other (See Comments)    Redness, please use "paper" tape     REVIEW OF SYSTEMS:  [X]  denotes positive finding, [ ]  denotes negative finding Cardiac  Comments:  Chest pain or chest pressure:    Shortness of breath upon exertion:    Short of breath when lying flat:    Irregular heart rhythm:        Vascular    Pain in calf, thigh, or hip brought on by ambulation:    Pain in feet at night that wakes you up from your sleep:     Blood clot in your  veins:    Leg swelling:         Pulmonary    Oxygen at home:    Productive cough:  Wheezing:         Neurologic    Sudden weakness in arms or legs:     Sudden numbness in arms or legs:     Sudden onset of difficulty speaking or slurred speech:    Temporary loss of vision in one eye:     Problems with dizziness:         Gastrointestinal    Blood in stool:     Vomited blood:         Genitourinary    Burning when urinating:     Blood in urine:        Psychiatric    Major depression:         Hematologic    Bleeding problems:    Problems with blood clotting too easily:        Skin    Rashes or ulcers:        Constitutional    Fever or chills:      PHYSICAL EXAMINATION:  Vitals:   04/01/21 1059 04/01/21 1101  BP: (!) 149/62 (!) 159/62  Pulse: (!) 51   Resp: 20   Temp: 97.9 F (36.6 C)   TempSrc: Temporal   SpO2: 97%   Weight: 227 lb 12.8 oz (103.3 kg)   Height: 5\' 2"  (1.575 m)     General:  WDWN in NAD; vital signs documented above Gait: Normal HENT: WNL, normocephalic Pulmonary: normal non-labored breathing , without wheezing Cardiac: regular HR, without  Murmurs without carotid bruit Abdomen: obese Vascular Exam/Pulses: 2+ radial pulses bilaterally, palpable pedal pulses bilaterally. Feet warm and well perfused Musculoskeletal: no muscle wasting or atrophy  Neurologic: A&O X 3;  No focal weakness or paresthesias are detected Psychiatric:  The pt has Normal affect.   Non-Invasive Vascular Imaging:   VAS US Carotid: 04/01/21 Summary: Right Carotid: Velocities in the right ICA are consistent with a 60-79% stenosis.   Left Carotid: Velocities in the left ICA are consistent with a 1-39% stenosis.   Vertebrals:  Bilateral vertebral arteries demonstrate antegrade flow.  Subclavians: Normal flow hemodynamics were seen in bilateral subclavian arteries.    ASSESSMENT/PLAN:: 70 y.o. female here for follow up of carotid stenosis. She has history of left CEA  in March of 2017 by Dr. Trula Slade for high grade asymptomatic stenosis. She is not having any new neurological symptoms. Her duplex today is stable with right ICA stenosis of 60-79%. Left 1-39%.Normal flow in her vertebral and subclavian arteries bilaterally - Continue Aspirin and Statin - Discussed importance of good blood pressure control - Reviewed signs and symptoms of TIA/ Stroke and she understands should this occur to call 911 or go to ER - She will return in 6 months with repeat carotid duplex   Karoline Caldwell, PA-C [facility] [phone]  Clinic MD:   [place]

## 2021-04-01 NOTE — Progress Notes (Signed)
Cyanocobalamin injection given to left deltoid.  Patient tolerated well. 

## 2021-04-02 ENCOUNTER — Other Ambulatory Visit: Payer: Self-pay

## 2021-04-02 DIAGNOSIS — I6523 Occlusion and stenosis of bilateral carotid arteries: Secondary | ICD-10-CM

## 2021-04-22 ENCOUNTER — Ambulatory Visit: Payer: Medicare Other

## 2021-04-27 ENCOUNTER — Other Ambulatory Visit: Payer: Self-pay | Admitting: Family Medicine

## 2021-04-27 ENCOUNTER — Other Ambulatory Visit: Payer: Self-pay | Admitting: Family

## 2021-05-06 ENCOUNTER — Ambulatory Visit (INDEPENDENT_AMBULATORY_CARE_PROVIDER_SITE_OTHER): Payer: Medicare Other | Admitting: *Deleted

## 2021-05-06 ENCOUNTER — Other Ambulatory Visit: Payer: Self-pay

## 2021-05-06 ENCOUNTER — Encounter: Payer: Self-pay | Admitting: Cardiology

## 2021-05-06 ENCOUNTER — Ambulatory Visit: Payer: Medicare Other | Admitting: Cardiology

## 2021-05-06 VITALS — BP 150/78 | HR 54 | Ht 62.5 in | Wt 221.4 lb

## 2021-05-06 DIAGNOSIS — E538 Deficiency of other specified B group vitamins: Secondary | ICD-10-CM

## 2021-05-06 DIAGNOSIS — I480 Paroxysmal atrial fibrillation: Secondary | ICD-10-CM

## 2021-05-06 NOTE — Progress Notes (Signed)
Electrophysiology Office Note   Date:  05/06/2021   ID:  Emily [name], DOB 1951/02/09, MRN [id number]  PCP:  Emily Norlander, DO  Cardiologist: [place] Primary Electrophysiologist:  Emily Spada Meredith Leeds, MD    Chief Complaint: Atrial fibrillation   History of Present Illness: Emily [name] is a 70 y.o. female who is being seen today for the evaluation of atrial fibrillation at the request of Ronnie Doss M, DO. Presenting today for electrophysiology evaluation.  She has a history significant for atrial fibrillation, diabetes, hypertension, hyperlipidemia, sleep apnea.  She is currently on flecainide.  She had GI bleeding and also did not tolerate anticoagulation.  She is status post watchman implant.  She has had more frequent episodes of atrial fibrillation.  Her flecainide was increased.  Today, denies symptoms of palpitations, chest pain, shortness of breath, orthopnea, PND, lower extremity edema, claudication, dizziness, presyncope, syncope, bleeding, or neurologic sequela. The patient is tolerating medications without difficulties.  Since last being seen she has done well.  She has noted no further episodes of atrial fibrillation.  She is quite happy with her control.  She has been able to do all of her daily activities without restriction.  Past Medical History:  Diagnosis Date   Anemia    Arthritis    Atrial fibrillation, rapid ([place]) 10/27/2013   Bilateral carotid artery disease (HCC)    Diabetes mellitus without complication ([place])    Difficult intubation    states 'lady that did the sleep study told me I have the smallest airway she has ever seen in an adult"   Dyslipidemia 06/29/2017   Dysrhythmia    Hypertension    Hypothyroid    Obesity    Obstructive sleep apnea    on C Pap   PAF (paroxysmal atrial fibrillation) ([place])    a. newly dx in 09/2013; on eliquis   Past Surgical History:  Procedure Laterality Date   CATARACT EXTRACTION Left    CATARACT  EXTRACTION W/PHACO Right 12/09/2015   Procedure: CATARACT EXTRACTION PHACO AND INTRAOCULAR LENS PLACEMENT ([place]);  Surgeon: Tonny Branch, MD;  Location: [facility];  Service: Ophthalmology;  Laterality: Right;  CDE:10.48   COLONOSCOPY W/ POLYPECTOMY     cyst on back of neck removed     DILATION AND CURETTAGE OF UTERUS     x 5   ENDARTERECTOMY Left 08/30/2015   Procedure: LEFT CAROYID ENDARTERECTOMY WITH XENOSURE BOVINE PERICARDIUM PATCH ANGIOPLASTY;  Surgeon: [name], MD;  Location: [facility];  Service: Vascular;  Laterality: Left;   EYE SURGERY     TONSILLECTOMY     UPPER GI ENDOSCOPY       Current Outpatient Medications  Medication Sig Dispense Refill   aspirin EC 81 MG tablet Take 81 mg by mouth daily.     bisacodyl (DULCOLAX) 5 MG EC tablet Take 5 mg by mouth daily.     diltiazem (CARDIZEM CD) 180 MG 24 hr capsule TAKE 1 CAPSULE BY MOUTH EVERY DAY 90 capsule 3   diltiazem (CARDIZEM) 30 MG tablet Take 30 mg by mouth as needed.     ezetimibe (ZETIA) 10 MG tablet TAKE 1 TABLET BY MOUTH EVERY DAY 90 tablet 3   flecainide (TAMBOCOR) 100 MG tablet Take 1 tablet (100 mg total) by mouth 2 (two) times daily. 180 tablet 3   furosemide (LASIX) 20 MG tablet TAKE 1 TABLET (20 MG TOTAL) BY MOUTH DAILY AS NEEDED FOR FLUID. 90 tablet 0   glipiZIDE (GLUCOTROL) 5 MG tablet TAKE  1 TABLET BY MOUTH TWICE A DAY 180 tablet 0   JARDIANCE 25 MG TABS tablet TAKE 1 TABLET BY MOUTH DAILY BEFORE BREAKFAST 30 tablet 2   ketoconazole (NIZORAL) 2 % cream Apply 1 application topically daily. Prn intertrigo rash 60 g 1   levothyroxine (SYNTHROID) 112 MCG tablet TAKE 1 TABLET BY MOUTH EVERY OTHER DAY, ALTERNATING WITH 1 & 1/2 TABLETS EVERY OTHER DAY 112 tablet 0   losartan (COZAAR) 25 MG tablet Take 1 tablet (25 mg total) by mouth daily. 90 tablet 3   lovastatin (MEVACOR) 40 MG tablet Take 1 tablet (40 mg total) by mouth every evening. 90 tablet 3   metFORMIN (GLUCOPHAGE) 1000 MG tablet TAKE 1 TABLET (1,000 MG TOTAL) BY  MOUTH 2 (TWO) TIMES DAILY WITH A MEAL. 180 tablet 0   metoprolol succinate (TOPROL-XL) 100 MG 24 hr tablet TAKE 1 TABLET BY MOUTH DAILY. TAKE WITH OR IMMEDIATELY FOLLOWING A MEAL. 90 tablet 3   rOPINIRole (REQUIP) 0.5 MG tablet TAKE 1 TABLET BY MOUTH EVERYDAY AT BEDTIME 90 tablet 0   Current Facility-Administered Medications  Medication Dose Route Frequency Provider Last Rate Last Admin   cyanocobalamin ((VITAMIN B-12)) injection 1,000 mcg  1,000 mcg Intramuscular Q30 days Timmothy Euler, MD   1,000 mcg at 05/06/21 1129    Allergies:   Quinapril hcl, Statins, and Tape   Social History:  The patient  reports that she has never smoked. She has never used smokeless tobacco. She reports that she does not drink alcohol and does not use drugs.   Family History:  The patient's family history includes Arrhythmia in her sister, sister, and sister; COPD in her father; Cancer in her sister; Emphysema in her father; Heart disease in her father; Heart failure in her father.   ROS:  Please see the history of present illness.   Otherwise, review of systems is positive for none.   All other systems are reviewed and negative.   PHYSICAL EXAM: VS:  BP (!) 150/78   Pulse (!) 54   Ht 5' 2.5" (1.588 m)   Wt 221 lb 6.4 oz (100.4 kg)   SpO2 97%   BMI 39.85 kg/m  , BMI Body mass index is 39.85 kg/m. GEN: Well nourished, well developed, in no acute distress  HEENT: normal  Neck: no JVD, carotid bruits, or masses Cardiac: RRR; no murmurs, rubs, or gallops,no edema  Respiratory:  clear to auscultation bilaterally, normal work of breathing GI: soft, nontender, nondistended, + BS MS: no deformity or atrophy  Skin: warm and dry Neuro:  Strength and sensation are intact Psych: euthymic mood, full affect  EKG:  EKG is ordered today. Personal review of the ekg ordered shows sinus rhythm, right bundle branch block, rate 158  Recent Labs: 08/30/2020: ALT 24; TSH 0.802 09/04/2020: Hemoglobin 11.4; Platelets  253 01/17/2021: BUN 30; Creatinine, Ser 0.69; Magnesium 2.2; Potassium 4.4; Sodium 138    Lipid Panel     Component Value Date/Time   CHOL 145 09/08/2019 1015   TRIG 156 (H) 09/08/2019 1015   HDL 37 (L) 09/08/2019 1015   CHOLHDL 3.9 09/08/2019 1015   LDLCALC 81 09/08/2019 1015     Wt Readings from Last 3 Encounters:  05/06/21 221 lb 6.4 oz (100.4 kg)  04/01/21 227 lb 12.8 oz (103.3 kg)  03/18/21 222 lb 9.6 oz (101 kg)      Other studies Reviewed: Additional studies/ records that were reviewed today include: TTE 08/30/20  Review of the above records  today demonstrates:   1. Left ventricular ejection fraction, by estimation, is 60 to 65%. The  left ventricle has normal function. The left ventricle has no regional  wall motion abnormalities. There is moderate left ventricular hypertrophy.  Left ventricular diastolic  parameters are consistent with Grade II diastolic dysfunction  (pseudonormalization). Elevated left atrial pressure.   2. Right ventricular systolic function is normal. The right ventricular  size is normal.   3. Left atrial size was moderately dilated.   4. The pericardial effusion is circumferential.   5. The mitral valve is normal in structure. Trivial mitral valve  regurgitation. No evidence of mitral stenosis. Moderate mitral annular  calcification.   6. The aortic valve is tricuspid. There is moderate calcification of the  aortic valve. There is moderate thickening of the aortic valve. Aortic  valve regurgitation is not visualized. Moderate aortic valve stenosis.   7. The inferior vena cava is dilated in size with <50% respiratory  variability, suggesting right atrial pressure of 15 mmHg.   Myoview 09/17/2020 There was no ST segment deviation noted during stress. Findings consistent with prior distal anteior/apical myocardial infarction with mild peri-infarct ischemia. The left ventricular ejection fraction is normal (55-65%). Low risk based on perfusion  imaging. Elevated TID 1.41 may suggest higher relative risk.  ASSESSMENT AND PLAN:  1.  Persistent atrial fibrillation: CHA2DS2-VASc of 4.  Currently on flecainide 100 mg twice daily.  She has a Forensic scientist in place and thus not anticoagulated.  High risk medication monitoring for flecainide via ECG.  Remains in sinus rhythm.  No changes at this time.  2.  GI bleeding: Has not been anticoagulated.  Currently has a Forensic scientist.  No changes.  3.  Carotid artery stenosis: Mild on ultrasound.  Plan per primary cardiology.  4.  Moderate aortic stenosis: Has been stable.  Plan per primary cardiology.  Current medicines are reviewed at length with the patient today.   The patient does not have concerns regarding her medicines.  The following changes were made today: None  Labs/ tests ordered today include:  Orders Placed This Encounter  Procedures   EKG 12-Lead      Disposition:   FU with EP app 6 months  Signed, Keontae Levingston Meredith Leeds, MD  05/06/2021 2:50 PM     [address] [place] [address] [phone] (office) [phone] (fax)

## 2021-05-06 NOTE — Patient Instructions (Signed)
Medication Instructions:  Your physician recommends that you continue on your current medications as directed. Please refer to the Current Medication list given to you today.  *If you need a refill on your cardiac medications before your next appointment, please call your pharmacy*   Lab Work: None ordered   Testing/Procedures: None ordered   Follow-Up: At [facility], you and your health needs are our priority.  As part of our continuing mission to provide you with exceptional heart care, we have created designated Provider Care Teams.  These Care Teams include your primary Cardiologist (physician) and Advanced Practice Providers (APPs -  Physician Assistants and Nurse Practitioners) who all work together to provide you with the care you need, when you need it.  Your next appointment:   6 month(s)  The format for your next appointment:   In Person  Provider:   You will see one of the following Advanced Practice Providers on your designated Care Team:   Renee Ursuy, PA-C Michael "Andy" Tillery, PA-C   Thank you for choosing [facility]!!   Addylin Manke, RN [phone]         

## 2021-05-12 ENCOUNTER — Telehealth: Payer: Self-pay | Admitting: Family Medicine

## 2021-05-12 DIAGNOSIS — E119 Type 2 diabetes mellitus without complications: Secondary | ICD-10-CM

## 2021-05-13 MED ORDER — EMPAGLIFLOZIN 25 MG PO TABS
25.0000 mg | ORAL_TABLET | Freq: Every day | ORAL | 4 refills | Status: AC
Start: 2021-05-13 — End: 2021-08-11

## 2021-05-13 NOTE — Telephone Encounter (Signed)
Call returned Jardiance app for [employer] submitted today for patient assistance--[facility] (on behlf of [employer] patient assistance) Ships to patients home Gfr 93 A1c 6.4!

## 2021-05-20 ENCOUNTER — Telehealth: Payer: Self-pay | Admitting: Family Medicine

## 2021-05-20 NOTE — Telephone Encounter (Signed)
Will check on jardiance application It may be later this week before we know of approval  80-month of samples left up front for patient to pick up (jardiance 25mg )

## 2021-05-20 NOTE — Telephone Encounter (Signed)
Patient aware.

## 2021-05-23 ENCOUNTER — Encounter: Payer: Self-pay | Admitting: Nurse Practitioner

## 2021-05-23 ENCOUNTER — Ambulatory Visit (INDEPENDENT_AMBULATORY_CARE_PROVIDER_SITE_OTHER): Payer: Medicare Other | Admitting: Nurse Practitioner

## 2021-05-23 VITALS — BP 131/66 | HR 47 | Temp 96.9°F | Resp 20 | Ht 62.0 in | Wt 218.0 lb

## 2021-05-23 DIAGNOSIS — H00021 Hordeolum internum right upper eyelid: Secondary | ICD-10-CM | POA: Diagnosis not present

## 2021-05-23 MED ORDER — ERYTHROMYCIN 5 MG/GM OP OINT: 1.0000 "application " | TOPICAL_OINTMENT | Freq: Two times a day (BID) | OPHTHALMIC | 0 refills | Status: DC

## 2021-05-23 MED ORDER — SULFAMETHOXAZOLE-TRIMETHOPRIM 800-160 MG PO TABS: 1.0000 | ORAL_TABLET | Freq: Two times a day (BID) | ORAL | 0 refills | Status: DC

## 2021-05-23 NOTE — Patient Instructions (Signed)
Stye A stye, also known as a hordeolum, is a bump that forms on an eyelid. It may look like a pimple next to the eyelash. A stye can form inside the eyelid (internal stye) or outside the eyelid (external stye). A stye can cause redness, swelling, and pain on the eyelid. Styes are very common. Anyone can get them at any age. They usually occur in just one eye at a time, but you may have more than one in either eye. What are the causes? A stye is caused by an infection. The infection is almost always caused by bacteria called Staphylococcus aureus. This is a common type of bacteria that lives on the skin. An internal stye may result from an infected oil-producing gland inside the eyelid. An external stye may be caused by an infection at the base of the eyelash (hair follicle). What increases the risk? You are more likely to develop a stye if: You have had a stye before. You have any of these conditions: Red, itchy, inflamed eyelids (blepharitis). A skin condition such as seborrheic dermatitis or rosacea. High fat levels in your blood (lipids). Dry eyes. What are the signs or symptoms? The most common symptom of a stye is eyelid pain. Internal styes are more painful than external styes. Other symptoms may include: Painful swelling of your eyelid. A scratchy feeling in your eye. Tearing and redness of your eye. A pimple-like bump on the edge of the eyelid. Pus draining from the stye. How is this diagnosed? Your health care provider may be able to diagnose a stye just by examining your eye. The health care provider may also check to make sure: You do not have a fever or other signs of a more serious infection. The infection has not spread to other parts of your eye or areas around your eye. How is this treated? Most styes will clear up in a few days without treatment or with warm compresses applied to the area. You may need to use antibiotic drops or ointment to treat an infection. Sometimes,  steroid drops or ointment are used in addition to antibiotics. In some cases, your health care provider may give you a small steroid injection in the eyelid. If your stye does not heal with routine treatment, your health care provider may drain pus from the stye using a thin blade or needle. This may be done if the stye is large, causing a lot of pain, or affecting your vision. Follow these instructions at home: Take over-the-counter and prescription medicines only as told by your health care provider. This includes eye drops or ointments. If you were prescribed an antibiotic medicine, steroid medicine, or both, apply or use them as told by your health care provider. Do not stop using the medicine even if your condition improves. Apply a warm, wet cloth (warm compress) to your eye for 5-10 minutes, 4 to 6 times a day. Clean the affected eyelid as directed by your health care provider. Do not wear contact lenses or eye makeup until your stye has healed and your health care provider says that it is safe. Do not try to pop or drain the stye. Do not rub your eye. Contact a health care provider if: You have chills or a fever. Your stye does not go away after several days. Your stye affects your vision. Your eyeball becomes swollen, red, or painful. Get help right away if: You have pain when moving your eye around. Summary A stye is a bump that forms  on an eyelid. It may look like a pimple next to the eyelash. A stye can form inside the eyelid (internal stye) or outside the eyelid (external stye). A stye can cause redness, swelling, and pain on the eyelid. Your health care provider may be able to diagnose a stye just by examining your eye. Apply a warm, wet cloth (warm compress) to your eye for 5-10 minutes, 4 to 6 times a day. This information is not intended to replace advice given to you by your health care provider. Make sure you discuss any questions you have with your health care  provider. Document Revised: 07/24/2020 Document Reviewed: 07/24/2020 Elsevier Patient Education  [place].

## 2021-05-23 NOTE — Progress Notes (Signed)
° °  Subjective:    Patient ID: Emily [name], female    DOB: Jun 20, 1950, 70 y.o.   MRN: [id number]  Chief Complaint: Eye red and swollen (Right eye/)   HPI Patient comes in today c/o right upper lid erythematous and sore to chart. Started several days ago.    Review of Systems  Eyes:  Positive for pain, discharge and redness. Negative for photophobia and visual disturbance.  All other systems reviewed and are negative.     Objective:   Physical Exam Vitals reviewed.  Constitutional:      Appearance: Normal appearance.  Eyes:     General:        Right eye: Discharge present.     Pupils: Pupils are equal, round, and reactive to light.     Comments: Right upper lid erythematous and edematous  Cardiovascular:     Rate and Rhythm: Normal rate and regular rhythm.     Heart sounds: Normal heart sounds.  Pulmonary:     Effort: Pulmonary effort is normal.     Breath sounds: Normal breath sounds.  Skin:    General: Skin is warm.  Neurological:     General: No focal deficit present.     Mental Status: She is alert and oriented to person, place, and time.  Psychiatric:        Mood and Affect: Mood normal.        Behavior: Behavior normal.   BP 131/66    Pulse (!) 47    Temp (!) 96.9 F (36.1 C) (Temporal)    Resp 20    Ht 5\' 2"  (1.575 m)    Wt 218 lb (98.9 kg)    SpO2 99%    BMI 39.87 kg/m         Assessment & Plan:  Emily [name] in today with chief complaint of Eye red and swollen (Right eye/)   1. Hordeolum internum of right upper eyelid Avoid rubbing eye Cool compresses Good handwashing If not improving RTO  - erythromycin ophthalmic ointment; Place 1 application into the right eye in the morning and at bedtime.  Dispense: 3.5 g; Refill: 0 - sulfamethoxazole-trimethoprim (BACTRIM DS) 800-160 MG tablet; Take 1 tablet by mouth 2 (two) times daily.  Dispense: 20 tablet; Refill: 0    The above assessment and management plan was discussed with the patient. The  patient verbalized understanding of and has agreed to the management plan. Patient is aware to call the clinic if symptoms persist or worsen. Patient is aware when to return to the clinic for a follow-up visit. Patient educated on when it is appropriate to go to the emergency department.   Emily [name] Done, FNP

## 2021-06-03 ENCOUNTER — Ambulatory Visit (INDEPENDENT_AMBULATORY_CARE_PROVIDER_SITE_OTHER): Payer: Medicare Other | Admitting: Family Medicine

## 2021-06-03 ENCOUNTER — Encounter: Payer: Self-pay | Admitting: Family Medicine

## 2021-06-03 VITALS — BP 135/55 | HR 53 | Temp 97.6°F | Ht 62.0 in | Wt 220.4 lb

## 2021-06-03 DIAGNOSIS — E785 Hyperlipidemia, unspecified: Secondary | ICD-10-CM | POA: Diagnosis not present

## 2021-06-03 DIAGNOSIS — H00021 Hordeolum internum right upper eyelid: Secondary | ICD-10-CM

## 2021-06-03 DIAGNOSIS — L304 Erythema intertrigo: Secondary | ICD-10-CM

## 2021-06-03 DIAGNOSIS — Z961 Presence of intraocular lens: Secondary | ICD-10-CM | POA: Diagnosis not present

## 2021-06-03 DIAGNOSIS — I480 Paroxysmal atrial fibrillation: Secondary | ICD-10-CM

## 2021-06-03 DIAGNOSIS — E119 Type 2 diabetes mellitus without complications: Secondary | ICD-10-CM | POA: Diagnosis not present

## 2021-06-03 DIAGNOSIS — H00011 Hordeolum externum right upper eyelid: Secondary | ICD-10-CM | POA: Diagnosis not present

## 2021-06-03 DIAGNOSIS — I152 Hypertension secondary to endocrine disorders: Secondary | ICD-10-CM | POA: Diagnosis not present

## 2021-06-03 DIAGNOSIS — E1159 Type 2 diabetes mellitus with other circulatory complications: Secondary | ICD-10-CM | POA: Diagnosis not present

## 2021-06-03 DIAGNOSIS — E1169 Type 2 diabetes mellitus with other specified complication: Secondary | ICD-10-CM | POA: Diagnosis not present

## 2021-06-03 LAB — BAYER DCA HB A1C WAIVED: HB A1C (BAYER DCA - WAIVED): 7 % — ABNORMAL HIGH (ref 4.8–5.6)

## 2021-06-03 MED ORDER — KETOCONAZOLE 2 % EX CREA: 1.0000 "application " | TOPICAL_CREAM | Freq: Every day | CUTANEOUS | 1 refills | Status: DC

## 2021-06-03 MED ORDER — AMOXICILLIN-POT CLAVULANATE 875-125 MG PO TABS: 1.0000 | ORAL_TABLET | Freq: Two times a day (BID) | ORAL | 0 refills | Status: DC

## 2021-06-03 NOTE — Progress Notes (Signed)
Subjective: CC:DM PCP: Emily Norlander, DO OAC:ZYSAY Emily [name] is a 71 y.o. female presenting to clinic today for:  1. Type 2 Diabetes with hypertension, hyperlipidemia:  Compliant with medications.  She admits that she has been eating more over the holidays and worries that her blood sugar will be elevated.  She continues to have intermittent yeast infections.  Has ketoconazole on hand if needed but uses a barrier cream after each bathroom use  Last eye exam: Up-to-date.  Saw eye doctor today Last foot exam: Up-to-date Last A1c:  Lab Results  Component Value Date   HGBA1C 6.4 12/17/2020   Nephropathy screen indicated?:  Up-to-date Last flu, zoster and/or pneumovax:  Immunization History  Administered Date(s) Administered   Fluad Quad(high Dose 65+) 02/28/2019, 03/05/2020   Influenza, High Dose Seasonal PF 03/24/2016, 03/22/2018   Influenza, Seasonal, Injecte, Preservative Fre 06/05/2014, 07/02/2015   Influenza-Unspecified 03/24/2016   PFIZER(Purple Top)SARS-COV-2 Vaccination 07/10/2019, 08/03/2019, 03/13/2020   Pneumococcal Conjugate-13 07/09/2015   Pneumococcal Polysaccharide-23 01/30/2014, 02/28/2019   Tdap 05/26/2014    ROS: No chest pain, shortness of breath  2.  Hordeolum Patient was started on antibiotics for a right upper lid stye.  It has gotten slightly better but continues to be quite large.  She saw her eye doctor today and they have instructed her to use warm compresses to promote drainage and they in fact would like to switch her off of her current antibiotic onto Augmentin but apparently this has been backordered.  They instead gave her a Z-Pak but they were worried about possible drug drug interactions.    ROS: Per HPI  Allergies  Allergen Reactions   Quinapril Hcl Cough   Statins Other (See Comments)    Not all Statins but some cause cough and pain in legs.   Tape Other (See Comments)    Redness, please use "paper" tape   Past Medical History:   Diagnosis Date   Anemia    Arthritis    Atrial fibrillation, rapid ([name]) 10/27/2013   Bilateral carotid artery disease (HCC)    Diabetes mellitus without complication ([place])    Difficult intubation    states 'lady that did the sleep study told me I have the smallest airway she has ever seen in an adult"   Dyslipidemia 06/29/2017   Dysrhythmia    Hypertension    Hypothyroid    Obesity    Obstructive sleep apnea    on C Pap   PAF (paroxysmal atrial fibrillation) ([place])    a. newly dx in 09/2013; on eliquis    Current Outpatient Medications:    aspirin EC 81 MG tablet, Take 81 mg by mouth daily., Disp: , Rfl:    bisacodyl (DULCOLAX) 5 MG EC tablet, Take 5 mg by mouth daily., Disp: , Rfl:    diltiazem (CARDIZEM CD) 180 MG 24 hr capsule, TAKE 1 CAPSULE BY MOUTH EVERY DAY, Disp: 90 capsule, Rfl: 3   diltiazem (CARDIZEM) 30 MG tablet, Take 30 mg by mouth as needed., Disp: , Rfl:    empagliflozin (JARDIANCE) 25 MG TABS tablet, Take 1 tablet (25 mg total) by mouth daily before breakfast., Disp: 90 tablet, Rfl: 4   erythromycin ophthalmic ointment, Place 1 application into the right eye in the morning and at bedtime., Disp: 3.5 g, Rfl: 0   ezetimibe (ZETIA) 10 MG tablet, TAKE 1 TABLET BY MOUTH EVERY DAY, Disp: 90 tablet, Rfl: 3   flecainide (TAMBOCOR) 100 MG tablet, Take 1 tablet (100 mg total) by mouth  2 (two) times daily., Disp: 180 tablet, Rfl: 3   furosemide (LASIX) 20 MG tablet, TAKE 1 TABLET (20 MG TOTAL) BY MOUTH DAILY AS NEEDED FOR FLUID., Disp: 90 tablet, Rfl: 0   glipiZIDE (GLUCOTROL) 5 MG tablet, TAKE 1 TABLET BY MOUTH TWICE A DAY, Disp: 180 tablet, Rfl: 0   ketoconazole (NIZORAL) 2 % cream, Apply 1 application topically daily. Prn intertrigo rash, Disp: 60 g, Rfl: 1   levothyroxine (SYNTHROID) 112 MCG tablet, TAKE 1 TABLET BY MOUTH EVERY OTHER DAY, ALTERNATING WITH 1 & 1/2 TABLETS EVERY OTHER DAY, Disp: 112 tablet, Rfl: 0   losartan (COZAAR) 25 MG tablet, Take 1 tablet (25 mg total) by  mouth daily., Disp: 90 tablet, Rfl: 3   lovastatin (MEVACOR) 40 MG tablet, Take 1 tablet (40 mg total) by mouth every evening., Disp: 90 tablet, Rfl: 3   metFORMIN (GLUCOPHAGE) 1000 MG tablet, TAKE 1 TABLET (1,000 MG TOTAL) BY MOUTH 2 (TWO) TIMES DAILY WITH A MEAL., Disp: 180 tablet, Rfl: 0   metoprolol succinate (TOPROL-XL) 100 MG 24 hr tablet, TAKE 1 TABLET BY MOUTH DAILY. TAKE WITH OR IMMEDIATELY FOLLOWING A MEAL., Disp: 90 tablet, Rfl: 3   rOPINIRole (REQUIP) 0.5 MG tablet, TAKE 1 TABLET BY MOUTH EVERYDAY AT BEDTIME, Disp: 90 tablet, Rfl: 0   sulfamethoxazole-trimethoprim (BACTRIM DS) 800-160 MG tablet, Take 1 tablet by mouth 2 (two) times daily., Disp: 20 tablet, Rfl: 0  Current Facility-Administered Medications:    cyanocobalamin ((VITAMIN B-12)) injection 1,000 mcg, 1,000 mcg, Intramuscular, Q30 days, Emily Euler, MD, 1,000 mcg at 05/06/21 1129 Social History   Socioeconomic History   Marital status: Single    Spouse name: Not on file   Number of children: Not on file   Years of education: Not on file   Highest education level: Not on file  Occupational History   Not on file  Tobacco Use   Smoking status: Never   Smokeless tobacco: Never  Vaping Use   Vaping Use: Never used  Substance and Sexual Activity   Alcohol use: No    Alcohol/week: 0.0 standard drinks   Drug use: Never   Sexual activity: Never  Other Topics Concern   Not on file  Social History Narrative   Not on file   Social Determinants of Health   Financial Resource Strain: Not on file  Food Insecurity: Not on file  Transportation Needs: Not on file  Physical Activity: Not on file  Stress: Not on file  Social Connections: Not on file  Intimate Partner Violence: Not on file   Family History  Problem Relation Age of Onset   COPD Father    Heart failure Father    Heart disease Father    Emphysema Father    Arrhythmia Sister    Arrhythmia Sister    Arrhythmia Sister        had PPM also    Cancer Sister     Objective: Office vital signs reviewed. BP (!) 135/55    Pulse (!) 53    Temp 97.6 F (36.4 C)    Ht 5\' 2"  (1.575 m)    Wt 220 lb 6.4 oz (100 kg)    SpO2 96%    BMI 40.31 kg/m   Physical Examination:  General: Awake, alert, well nourished, No acute distress HEENT: Right upper lid with bean sized hordeolum that is erythematous.  No active drainage. Cardio: Bradycardic with a seemingly regular rhythm, H8E9 heard, systolic murmur present pulm: clear to auscultation bilaterally,  no wheezes, rhonchi or rales; normal work of breathing on room air  Assessment/ Plan: 71 y.o. female   Controlled type 2 diabetes mellitus with other specified complication, without long-term current use of insulin ([place]) - Plan: Bayer DCA Hb A1c Waived  Hyperlipidemia associated with type 2 diabetes mellitus ([place]) - Plan: LDL Cholesterol, Direct  Hypertension associated with diabetes ([place]) - Plan: Basic Metabolic Panel  PAF (paroxysmal atrial fibrillation) ([place]) - Plan: CBC  Intertrigo - Plan: ketoconazole (NIZORAL) 2 % cream  Hordeolum internum of right upper eyelid - Plan: amoxicillin-clavulanate (AUGMENTIN) 875-125 MG tablet  Sugar remains under control with A1c of 7.0.  No changes  Direct LDL obtained given nonfasting status.  Continue Zetia, lovastatin  Blood pressure controlled.  No changes needed  She is rate controlled.  Check CBC  Intertrigo continues to be an issue with the Jardiance but she does not want to come off of it.  Ketoconazole renewed  I reviewed with her that Z-Pak does increase her risk of arrhythmia.  If she can find Augmentin I would much prefer her to utilize that medication and lieu of the azithromycin for treatment of the hordeolum.  She will contact me if she prefers referral to ophthalmology for [facility] procedure.  Orders Placed This Encounter  Procedures   Basic Metabolic Panel   CBC   Bayer DCA Hb A1c Waived   LDL Cholesterol, Direct   No orders of  the defined types were placed in this encounter.    Emily Norlander, DO [place] [phone]

## 2021-06-04 ENCOUNTER — Other Ambulatory Visit: Payer: Self-pay

## 2021-06-04 ENCOUNTER — Encounter: Payer: Self-pay | Admitting: Family Medicine

## 2021-06-04 ENCOUNTER — Ambulatory Visit: Payer: Medicare Other | Admitting: Family Medicine

## 2021-06-04 ENCOUNTER — Ambulatory Visit (HOSPITAL_COMMUNITY)
Admission: RE | Admit: 2021-06-04 | Discharge: 2021-06-04 | Disposition: A | Discharge status: Home / Self Care | Payer: Medicare Other | Source: Ambulatory Visit | Attending: Family Medicine | Admitting: Family Medicine

## 2021-06-04 VITALS — BP 131/47 | HR 50 | Temp 97.4°F | Ht 62.0 in | Wt 220.4 lb

## 2021-06-04 DIAGNOSIS — R2241 Localized swelling, mass and lump, right lower limb: Secondary | ICD-10-CM | POA: Insufficient documentation

## 2021-06-04 DIAGNOSIS — R600 Localized edema: Secondary | ICD-10-CM | POA: Diagnosis not present

## 2021-06-04 LAB — BASIC METABOLIC PANEL
BUN/Creatinine Ratio: 27 (ref 12–28)
BUN: 22 mg/dL (ref 8–27)
CO2: 23 mmol/L (ref 20–29)
Calcium: 9.1 mg/dL (ref 8.7–10.3)
Chloride: 102 mmol/L (ref 96–106)
Creatinine, Ser: 0.81 mg/dL (ref 0.57–1.00)
Glucose: 160 mg/dL — ABNORMAL HIGH (ref 70–99)
Potassium: 4.8 mmol/L (ref 3.5–5.2)
Sodium: 139 mmol/L (ref 134–144)
eGFR: 78 mL/min/{1.73_m2} (ref >59)

## 2021-06-04 LAB — CBC
Hematocrit: 38.3 % (ref 34.0–46.6)
Hemoglobin: 12.7 g/dL (ref 11.1–15.9)
MCH: 29.5 pg (ref 26.6–33.0)
MCHC: 33.2 g/dL (ref 31.5–35.7)
MCV: 89 fL (ref 79–97)
Platelets: 247 10*3/uL (ref 150–450)
RBC: 4.31 x10E6/uL (ref 3.77–5.28)
RDW: 14.4 % (ref 11.7–15.4)
WBC: 9.9 10*3/uL (ref 3.4–10.8)

## 2021-06-04 LAB — LDL CHOLESTEROL, DIRECT: LDL Direct: 89 mg/dL (ref 0–99)

## 2021-06-04 NOTE — Progress Notes (Signed)
Subjective: CC:leg swelling PCP: Janora Norlander, DO SAY:Emily [name] is a 71 y.o. female presenting to clinic today for:  1.  Leg swelling Patient reports abrupt onset of right lower leg swelling.  She has had some redness in the past on this leg but it is never really hurt nor had the degree of swelling that it has currently.  She has been on anticoagulation in the past but was taken off anticoagulation due to bleeding issues.  Has watchman device in place.  She has been on antibiotics for a stye in the right eye for over 1 week now with recent switch over to Augmentin last evening.   ROS: Per HPI  Allergies  Allergen Reactions   Quinapril Hcl Cough   Statins Other (See Comments)    Not all Statins but some cause cough and pain in legs.   Tape Other (See Comments)    Redness, please use "paper" tape   Past Medical History:  Diagnosis Date   Anemia    Arthritis    Atrial fibrillation, rapid ([place]) 10/27/2013   Bilateral carotid artery disease (HCC)    Diabetes mellitus without complication ([place])    Difficult intubation    states 'lady that did the sleep study told me I have the smallest airway she has ever seen in an adult"   Dyslipidemia 06/29/2017   Dysrhythmia    Hypertension    Hypothyroid    Obesity    Obstructive sleep apnea    on C Pap   PAF (paroxysmal atrial fibrillation) ([place])    a. newly dx in 09/2013; on eliquis    Current Outpatient Medications:    amoxicillin-clavulanate (AUGMENTIN) 875-125 MG tablet, Take 1 tablet by mouth 2 (two) times daily., Disp: 20 tablet, Rfl: 0   aspirin EC 81 MG tablet, Take 81 mg by mouth daily., Disp: , Rfl:    bisacodyl (DULCOLAX) 5 MG EC tablet, Take 5 mg by mouth daily., Disp: , Rfl:    diltiazem (CARDIZEM CD) 180 MG 24 hr capsule, TAKE 1 CAPSULE BY MOUTH EVERY DAY, Disp: 90 capsule, Rfl: 3   diltiazem (CARDIZEM) 30 MG tablet, Take 30 mg by mouth as needed., Disp: , Rfl:    empagliflozin (JARDIANCE) 25 MG TABS tablet,  Take 1 tablet (25 mg total) by mouth daily before breakfast., Disp: 90 tablet, Rfl: 4   erythromycin ophthalmic ointment, Place 1 application into the right eye in the morning and at bedtime., Disp: 3.5 g, Rfl: 0   ezetimibe (ZETIA) 10 MG tablet, TAKE 1 TABLET BY MOUTH EVERY DAY, Disp: 90 tablet, Rfl: 3   flecainide (TAMBOCOR) 100 MG tablet, Take 1 tablet (100 mg total) by mouth 2 (two) times daily., Disp: 180 tablet, Rfl: 3   furosemide (LASIX) 20 MG tablet, TAKE 1 TABLET (20 MG TOTAL) BY MOUTH DAILY AS NEEDED FOR FLUID., Disp: 90 tablet, Rfl: 0   glipiZIDE (GLUCOTROL) 5 MG tablet, TAKE 1 TABLET BY MOUTH TWICE A DAY, Disp: 180 tablet, Rfl: 0   ketoconazole (NIZORAL) 2 % cream, Apply 1 application topically daily. Prn intertrigo rash, Disp: 60 g, Rfl: 1   levothyroxine (SYNTHROID) 112 MCG tablet, TAKE 1 TABLET BY MOUTH EVERY OTHER DAY, ALTERNATING WITH 1 & 1/2 TABLETS EVERY OTHER DAY, Disp: 112 tablet, Rfl: 0   losartan (COZAAR) 25 MG tablet, Take 1 tablet (25 mg total) by mouth daily., Disp: 90 tablet, Rfl: 3   lovastatin (MEVACOR) 40 MG tablet, Take 1 tablet (40 mg total) by  mouth every evening., Disp: 90 tablet, Rfl: 3   metFORMIN (GLUCOPHAGE) 1000 MG tablet, TAKE 1 TABLET (1,000 MG TOTAL) BY MOUTH 2 (TWO) TIMES DAILY WITH A MEAL., Disp: 180 tablet, Rfl: 0   metoprolol succinate (TOPROL-XL) 100 MG 24 hr tablet, TAKE 1 TABLET BY MOUTH DAILY. TAKE WITH OR IMMEDIATELY FOLLOWING A MEAL., Disp: 90 tablet, Rfl: 3   rOPINIRole (REQUIP) 0.5 MG tablet, TAKE 1 TABLET BY MOUTH EVERYDAY AT BEDTIME, Disp: 90 tablet, Rfl: 0   sulfamethoxazole-trimethoprim (BACTRIM DS) 800-160 MG tablet, Take 1 tablet by mouth 2 (two) times daily., Disp: 20 tablet, Rfl: 0  Current Facility-Administered Medications:    cyanocobalamin ((VITAMIN B-12)) injection 1,000 mcg, 1,000 mcg, Intramuscular, Q30 days, Timmothy Euler, MD, 1,000 mcg at 05/06/21 1129 Social History   Socioeconomic History   Marital status: Single     Spouse name: Not on file   Number of children: Not on file   Years of education: Not on file   Highest education level: Not on file  Occupational History   Not on file  Tobacco Use   Smoking status: Never   Smokeless tobacco: Never  Vaping Use   Vaping Use: Never used  Substance and Sexual Activity   Alcohol use: No    Alcohol/week: 0.0 standard drinks   Drug use: Never   Sexual activity: Never  Other Topics Concern   Not on file  Social History Narrative   Not on file   Social Determinants of Health   Financial Resource Strain: Not on file  Food Insecurity: Not on file  Transportation Needs: Not on file  Physical Activity: Not on file  Stress: Not on file  Social Connections: Not on file  Intimate Partner Violence: Not on file   Family History  Problem Relation Age of Onset   COPD Father    Heart failure Father    Heart disease Father    Emphysema Father    Arrhythmia Sister    Arrhythmia Sister    Arrhythmia Sister        had PPM also   Cancer Sister     Objective: Office vital signs reviewed. BP (!) 131/47    Pulse (!) 50    Temp (!) 97.4 F (36.3 C)    Ht 5\' 2"  (1.575 m)    Wt 220 lb 6.4 oz (100 kg)    SpO2 96%    BMI 40.31 kg/m   Physical Examination:  General: Awake, alert, well nourished, No acute distress MSK: pain with homan's maneuver on right.  No significant calf girth discrepancy.  She does have induration, erythema and warmth of the lower leg just inferior to the gastroc on the right.  Assessment/ Plan: 71 y.o. female   Localized swelling of right lower leg - Plan: US Venous Img Lower Unilateral Right  Rule out acute DVT.  Stat venous ultrasound ordered.  This certainly could be presentation of a cellulitis but she has been on antibiotics for well over a week for a stye so would find that to be quite unusual.  Orders Placed This Encounter  Procedures   US Venous Img Lower Unilateral Right    Standing Status:   Future    Standing  Expiration Date:   06/04/2022    Order Specific Question:   Reason for Exam (SYMPTOM  OR DIAGNOSIS REQUIRED)    Answer:   RLE swelling, redness and pain in the posterior calf    Order Specific Question:   Preferred imaging  location?    Answer:   Internal   No orders of the defined types were placed in this encounter.    Janora Norlander, DO [place] [phone]

## 2021-06-08 ENCOUNTER — Other Ambulatory Visit: Payer: Self-pay | Admitting: Family Medicine

## 2021-06-10 ENCOUNTER — Ambulatory Visit (INDEPENDENT_AMBULATORY_CARE_PROVIDER_SITE_OTHER): Payer: Medicare Other | Admitting: Pharmacist

## 2021-06-10 ENCOUNTER — Ambulatory Visit (INDEPENDENT_AMBULATORY_CARE_PROVIDER_SITE_OTHER): Payer: Medicare Other | Admitting: *Deleted

## 2021-06-10 VITALS — BP 130/76

## 2021-06-10 DIAGNOSIS — E538 Deficiency of other specified B group vitamins: Secondary | ICD-10-CM | POA: Diagnosis not present

## 2021-06-10 DIAGNOSIS — G720 Drug-induced myopathy: Secondary | ICD-10-CM

## 2021-06-10 DIAGNOSIS — E785 Hyperlipidemia, unspecified: Secondary | ICD-10-CM

## 2021-06-10 DIAGNOSIS — E1169 Type 2 diabetes mellitus with other specified complication: Secondary | ICD-10-CM

## 2021-06-10 NOTE — Progress Notes (Signed)
Chronic Care Management Pharmacy Note  06/10/2021 Name:  Emily [name] MRN:  [id number] DOB:  1951-01-05  Summary: T2DM, HLD  Recommendations/Changes made from today's visit:  Diabetes: New goal. A1C 7%  (slight increase) Current treatment: Jardiance 31m daily (via [employer] cares patient assistance program) glipizide 531mtwice daily with meals metformin 1g twice daily with meals;  Current glucose readings: fasting glucose: <130, post prandial glucose: <190 Denies hypoglycemic/hyperglycemic symptoms Discussed meal planning options and Plate method for healthy eating Avoid sugary drinks and desserts Incorporate balanced protein, non starchy veggies, 1 serving of carbohydrate with each meal Increase water intake Increase physical activity as able Current exercise: N/A Recommended Potentially stopping glipizide, decreasing metformin and adding a GLP1 at f/u if a1c continues to increase; would help with weight/lipids as well Assessed patient finances. Patient enrolled in the [employer] cares patient assistance program for 2023--Jardiance to ship to home in 3 month increments  Blood pressure controlled; reviewed/documented  Hyperlipidemia:  New goal. Uncontrolled-LDL 89, GOAL <70; current treatment:LOVASTATIN 40MG, ZETIA 10MG;  Medications previously tried: PRAVASTATIN STATIN INDUCED MYOPATHY--CHART REVIEW COMPLETED AND NO OTHER STATINS FOUND, PATIENT HAS TRIED SEVERAL OTHERS, BUT DOESN'T RECALL NAMES; STABLE ON CURRENT REGIMEN Current dietary patterns: REVIEWED HEART HEALTHY DIET Recommended HEART HEALTHY DIET BEFORE ADJUSTING REGIMEN; ICD10-G72 DOCUMENTED FOR STATIN INDUCED MYOPATHY Lipid Panel     Component Value Date/Time   CHOL 145 09/08/2019 1015   TRIG 156 (H) 09/08/2019 1015   HDL 37 (L) 09/08/2019 1015   CHOLHDL 3.9 09/08/2019 1015   LDLCALC 81 09/08/2019 1015   LDLDIRECT 89 06/03/2021 1324   LABVLDL 27 09/08/2019 1015    Patient Goals/Self-Care  Activities patient will:  - take medications as prescribed as evidenced by patient report and record review check glucose 3X WEEKLY OR IF SYMPTOMATIC, document, and provide at future appointments check blood pressure 3X WEEKLY OR IF SYMPTOMATIC, document, and provide at future appointments collaborate with provider on medication access solutions target a minimum of 150 minutes of moderate intensity exercise weekly engage in dietary modifications by FOLLOWING HEALTHY PLATE METHOD AND HEART HEALTHY DIET  Plan: F/U SCHEDULED FOR MARCH 2023  Subjective: Emily [name] an [age].o. year old female who is a primary patient of Emily NorlanderDO.  The CCM team was consulted for assistance with disease management and care coordination needs.    Engaged with patient face to face for initial visit in response to provider referral for pharmacy case management and/or care coordination services.   Consent to Services:  The patient was given information about Chronic Care Management services, agreed to services, and gave verbal consent prior to initiation of services.  Please see initial visit note for detailed documentation.   Patient Care Team: Emily NorlanderDO as PCP - General (Family Medicine) HoMinus BreedingMD as PCP - Cardiology (Cardiology) CaConstance HawMD as PCP - Electrophysiology (Cardiology) HoMinus BreedingMD as Consulting Physician (Cardiology) DaFranchot GalloMD as Consulting Physician (Urology) CoJanie MorningMD (Gastroenterology) MiJonna ClarkMD as Referring Physician (Obstetrics and Gynecology) Emily MerinoMD as Consulting Physician (Rheumatology) BrSerafina MitchellMD as Consulting Physician (Vascular Surgery) PrLavera GuiseRPMoab [facility] HospitalPharmacist)  Objective:  Lab Results  Component Value Date   CREATININE 0.81 06/03/2021   CREATININE 0.69 01/17/2021   CREATININE 0.62 09/04/2020    Lab Results  Component Value Date    HGBA1C 7.0 (H) 06/03/2021   Last diabetic Eye exam:  Lab Results  Component Value Date/Time  HMDIABEYEEXA No Retinopathy 01/10/2020 12:00 AM    Last diabetic Foot exam: No results found for: HMDIABFOOTEX      Component Value Date/Time   CHOL 145 09/08/2019 1015   TRIG 156 (H) 09/08/2019 1015   HDL 37 (L) 09/08/2019 1015   CHOLHDL 3.9 09/08/2019 1015   LDLCALC 81 09/08/2019 1015   LDLDIRECT 89 06/03/2021 1324    Hepatic Function Latest Ref Rng & Units 08/30/2020 08/30/2020 08/20/2020  Total Protein 6.5 - 8.1 g/dL 7.2 7.6 7.3  Albumin 3.5 - 5.0 g/dL 3.9 4.1 4.4  AST 15 - 41 U/L _0 ALT 0 - 44 U/L _1 Alk Phosphatase 38 - 126 U/L 93 96 111  Total Bilirubin 0.3 - 1.2 mg/dL 1.4(H) 1.8(H) 0.5  Bilirubin, Direct 0.00 - 0.40 mg/dL - - -    Lab Results  Component Value Date/Time   TSH 0.802 08/30/2020 05:34 AM   TSH 1.330 08/20/2020 01:12 PM   TSH 4.110 11/20/2019 08:25 AM   FREET4 1.45 08/20/2020 01:12 PM   FREET4 1.02 11/06/2015 07:54 AM    CBC Latest Ref Rng & Units 06/03/2021 09/04/2020 08/30/2020  WBC 3.4 - 10.8 x10E3/uL 9.9 7.6 9.5  Hemoglobin 11.1 - 15.9 g/dL 12.7 11.4 12.2  Hematocrit 34.0 - 46.6 % 38.3 34.4 39.5  Platelets 150 - 450 x10E3/uL 247 253 215    No results found for: VD25OH  Clinical ASCVD: Yes  The 10-year ASCVD risk score (Arnett DK, et al., 2019) is: 25.8%   Values used to calculate the score:     Age: 71 years     Sex: Female     Is Non-Hispanic African American: No     Diabetic: Yes     Tobacco smoker: No     Systolic Blood Pressure: 381 mmHg     Is BP treated: Yes     HDL Cholesterol: 37 mg/dL     Total Cholesterol: 145 mg/dL    Other: (CHADS2VASc if Afib, PHQ9 if depression, MMRC or CAT for COPD, ACT, DEXA)  Social History   Tobacco Use  Smoking Status Never  Smokeless Tobacco Never   BP Readings from Last 3 Encounters:  06/04/21 (!) 131/47  06/03/21 (!) 135/55  05/23/21 131/66   Pulse Readings from Last 3 Encounters:   06/04/21 (!) 50  06/03/21 (!) 53  05/23/21 (!) 47   Wt Readings from Last 3 Encounters:  06/04/21 220 lb 6.4 oz (100 kg)  06/03/21 220 lb 6.4 oz (100 kg)  05/23/21 218 lb (98.9 kg)    Assessment: Review of patient past medical history, allergies, medications, health status, including review of consultants reports, laboratory and other test data, was performed as part of comprehensive evaluation and provision of chronic care management services.   SDOH:  (Social Determinants of Health) assessments and interventions performed:    CCM Care Plan  Allergies  Allergen Reactions   Quinapril Hcl Cough   Statins Other (See Comments)    Not all Statins but some cause cough and pain in legs.   Tape Other (See Comments)    Redness, please use "paper" tape    Medications Reviewed Today     Reviewed by Lavera Guise, [facility] (Pharmacist) on 06/10/21 at [place] List Status: <None>   Medication Order Taking? Sig Documenting Provider Last Dose Status Informant  amoxicillin-clavulanate (AUGMENTIN) 875-125 MG tablet [id number] No Take 1 tablet by mouth 2 (two) times daily. Janora Norlander, DO Taking Active  aspirin EC 81 MG tablet [id number] No Take 81 mg by mouth daily. [provider] Taking Active Self  bisacodyl (DULCOLAX) 5 MG EC tablet [id number] No Take 5 mg by mouth daily. [provider] Taking Active   cyanocobalamin ((VITAMIN B-12)) injection 1,000 mcg [id number]   Timmothy Euler, MD  Active   diltiazem (CARDIZEM CD) 180 MG 24 hr capsule [id number] No TAKE 1 CAPSULE BY MOUTH EVERY DAY Minus Breeding, MD Taking Active Self  diltiazem (CARDIZEM) 30 MG tablet [id number] No Take 30 mg by mouth as needed. [provider] Taking Active   empagliflozin (JARDIANCE) 25 MG TABS tablet [id number] No Take 1 tablet (25 mg total) by mouth daily before breakfast. Ronnie Doss M, DO Taking Active   erythromycin ophthalmic ointment [id number] No Place 1 application  into the right eye in the [name] and at bedtime. Hassell Done, Mary-Margaret, FNP Taking Active   ezetimibe (ZETIA) 10 MG tablet [id number] No TAKE 1 TABLET BY MOUTH EVERY DAY Minus Breeding, MD Taking Active   flecainide (TAMBOCOR) 100 MG tablet [id number] No Take 1 tablet (100 mg total) by mouth 2 (two) times daily. Minus Breeding, MD Taking Active   furosemide (LASIX) 20 MG tablet [id number] No TAKE 1 TABLET (20 MG TOTAL) BY MOUTH DAILY AS NEEDED FOR FLUID. Ronnie Doss M, DO Taking Active   glipiZIDE (GLUCOTROL) 5 MG tablet [id number] No TAKE 1 TABLET BY MOUTH TWICE A DAY Gottschalk, Ashly M, DO Taking Active   ketoconazole (NIZORAL) 2 % cream [id number] No Apply 1 application topically daily. Prn intertrigo rash Ronnie Doss M, DO Taking Active   levothyroxine (SYNTHROID) 112 MCG tablet [id number] No TAKE 1 TABLET BY MOUTH EVERY OTHER DAY, ALTERNATING WITH 1 & 1/2 TABLETS EVERY OTHER DAY Gottschalk, Ashly M, DO Taking Active   losartan (COZAAR) 25 MG tablet [id number] No Take 1 tablet (25 mg total) by mouth daily. Minus Breeding, MD Taking Active   lovastatin (MEVACOR) 40 MG tablet [id number] No Take 1 tablet (40 mg total) by mouth every evening. Minus Breeding, MD Taking Active   metFORMIN (GLUCOPHAGE) 1000 MG tablet [id number]  TAKE 1 TABLET (1,000 MG TOTAL) BY MOUTH 2 (TWO) TIMES DAILY WITH A MEAL. Ronnie Doss M, DO  Active   metoprolol succinate (TOPROL-XL) 100 MG 24 hr tablet [id number] No TAKE 1 TABLET BY MOUTH DAILY. TAKE WITH OR IMMEDIATELY FOLLOWING A MEAL. Ronnie Doss M, DO Taking Active   rOPINIRole (REQUIP) 0.5 MG tablet [id number] No TAKE 1 TABLET BY MOUTH EVERYDAY AT BEDTIME Janora Norlander, DO Taking Active   Discontinued 06/10/21 1056             Patient Active Problem List   Diagnosis Date Noted   Acute lower UTI    Atrial fibrillation with RVR ([place]) 08/30/2020   Chest pain 08/30/2020   Nonrheumatic aortic valve stenosis 03/19/2020   Persistent atrial  fibrillation ([place]) 01/16/2020   Bilateral carotid artery stenosis 10/12/2019   Educated about COVID-19 virus infection 10/12/2019   Acute on chronic blood loss anemia 09/25/2019   OSA on CPAP 09/25/2019   Upper GI bleed 09/25/2019   History of adenomatous polyp of colon 08/30/2019   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 device 07/16/2018   Chronic anticoagulation 03/03/2018   Melena 03/03/2018   Restless leg syndrome 02/16/2018   Chronic left-sided low back pain with left-sided sciatica 02/16/2018   Morbid obesity ([place]) 02/14/2018   Bruit 02/14/2018   PAF (paroxysmal atrial fibrillation) ([place]) 02/14/2018  DDD (degenerative disc disease), lumbar 12/07/2017   Peripheral arterial disease ([place]) 07/06/2017   Type 2 diabetes mellitus with hyperlipidemia ([place]) 06/29/2017   Iron deficiency anemia 05/27/2017   Vitamin B12 deficiency 05/27/2017   Pain in joint, ankle and foot 06/03/2016   Carotid stenosis 08/30/2015   Bilateral carotid artery disease ([place]) 07/05/2015   Acute bronchitis 01/31/2015   Multiple gastric polyps 07/03/2014   Diabetes mellitus without complication ([place])    Hypertension associated with diabetes ([place]) 10/27/2013   Hypothyroidism due to acquired atrophy of thyroid 10/27/2013   Hypothyroidism 06/06/2010   Nonspecific elevation of levels of transaminase or lactic acid dehydrogenase (LDH) 02/14/2007   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/2007   Cellulitis and abscess of finger, unspecified 01/17/2007    Immunization History  Administered Date(s) Administered   Fluad Quad(high Dose 65+) 02/28/2019, 03/05/2020   Influenza, High Dose Seasonal PF 03/24/2016, 03/22/2018   Influenza, Seasonal, Injecte, Preservative Fre 06/05/2014, 07/02/2015   Influenza-Unspecified 03/24/2016   PFIZER(Purple Top)SARS-COV-2 Vaccination 07/10/2019, 08/03/2019, 03/13/2020   Pneumococcal Conjugate-13 07/09/2015   Pneumococcal Polysaccharide-23 01/30/2014, 02/28/2019   Tdap  05/26/2014    Conditions to be addressed/monitored: HTN, HLD, and DMII  Care Plan : PHARMD MEDICATION MANAGEMENT  Updates made by Lavera Guise, [place] since 06/10/2021 12:00 AM     Problem: DISEASE PROGRESSION PREVENTION      Long-Range Goal: T2DM, HLD PHARMD GOALS   This Visit's Progress: Not on track  Priority: High  Note:   Current Barriers:  Unable to independently afford treatment regimen Unable to maintain control of T2DM Suboptimal therapeutic regimen for T2DM, HLD  Pharmacist Clinical Goal(s):  patient will verbalize ability to afford treatment regimen achieve control of T2DM, HLD as evidenced by GOAL A1C<7%, LDL<70 maintain control of T2MD, HLD as evidenced by GOAL A1C<7%, LDL<70  adhere to plan to optimize therapeutic regimen for T2DM, HLD as evidenced by report of adherence to recommended medication management changes through collaboration with PharmD and provider.   Interventions: 1:1 collaboration with Janora Norlander, DO regarding development and update of comprehensive plan of care as evidenced by provider attestation and co-signature Inter-disciplinary care team collaboration (see longitudinal plan of care) Comprehensive medication review performed; medication list updated in electronic medical record  Diabetes: New goal. A1C 7%  (slight increase) Current treatment: Jardiance 63m daily (via [employer] cares patient assistance program) glipizide 534mtwice daily with meals metformin 1g twice daily with meals;  Current glucose readings: fasting glucose: <130, post prandial glucose: <190 Denies hypoglycemic/hyperglycemic symptoms Discussed meal planning options and Plate method for healthy eating Avoid sugary drinks and desserts Incorporate balanced protein, non starchy veggies, 1 serving of carbohydrate with each meal Increase water intake Increase physical activity as able Current exercise: N/A Recommended Potentially stopping glipizide, decreasing metformin  and adding a GLP1 at f/u if a1c continues to increase; would help with weight/lipids as well Assessed patient finances. Patient enrolled in the [employer] cares patient assistance program for 2023--Jardiance to ship to home in 3 month increments  Blood pressure controlled; reviewed/documented  Hyperlipidemia:  New goal. Uncontrolled-LDL 89, GOAL <70; current treatment:LOVASTATIN 40MG, ZETIA 10MG;  Medications previously tried: PRAVASTATIN STATIN INDUCED MYOPATHY--CHART REVIEW COMPLETED AND NO OTHER STATINS FOUND, PATIENT HAS TRIED SEVERAL OTHERS, BUT DOESN'T RECALL NAMES; STABLE ON CURRENT REGIMEN Current dietary patterns: REVIEWED HEART HEALTHY DIET Recommended HEART HEALTHY DIET BEFORE ADJUSTING REGIMEN; ICD10-G72 DOCUMENTED FOR STATIN INDUCED MYOPATHY Lipid Panel     Component Value Date/Time   CHOL 145 09/08/2019 1015  TRIG 156 (H) 09/08/2019 1015   HDL 37 (L) 09/08/2019 1015   CHOLHDL 3.9 09/08/2019 1015   LDLCALC 81 09/08/2019 1015   LDLDIRECT 89 06/03/2021 1324   LABVLDL 27 09/08/2019 1015   Patient Goals/Self-Care Activities patient will:  - take medications as prescribed as evidenced by patient report and record review check glucose 3X WEEKLY OR IF SYMPTOMATIC, document, and provide at future appointments check blood pressure 3X WEEKLY OR IF SYMPTOMATIC, document, and provide at future appointments collaborate with provider on medication access solutions target a minimum of 150 minutes of moderate intensity exercise weekly engage in dietary modifications by FOLLOWING HEALTHY PLATE METHOD AND HEART HEALTHY DIET      Medication Assistance: Application for JARDIANCE/[employer]  medication assistance program. in process.  Anticipated assistance start date TBD.  See plan of care for additional detail.  Patient's preferred pharmacy is:  [id number] [place]Phone: [phone]:  [phone] [facility] [place] [id number]: [phone]: [phone] Uses pill box? No - N/A Pt endorses 100% compliance  Follow Up:  Patient agrees to Care Plan and Follow-up.  Plan: Telephone follow up appointment with care management team member scheduled for:  MARCH 2023   JRegina Eck PharmD, BCPS Clinical Pharmacist, [place] Phone [phone]

## 2021-06-10 NOTE — Patient Instructions (Addendum)
Visit Information  Thank you for taking time to visit with me today. Please don't hesitate to contact me if I can be of assistance to you before our next scheduled telephone appointment.  Following are the goals we discussed today:  Current Barriers:  Unable to independently afford treatment regimen Unable to maintain control of T2DM Suboptimal therapeutic regimen for T2DM, HLD  Pharmacist Clinical Goal(s):  patient will verbalize ability to afford treatment regimen achieve control of T2DM, HLD as evidenced by GOAL A1C<7%, LDL<70 maintain control of T2MD, HLD as evidenced by GOAL A1C<7%, LDL<70  adhere to plan to optimize therapeutic regimen for T2DM, HLD as evidenced by report of adherence to recommended medication management changes through collaboration with PharmD and provider.   Interventions: 1:1 collaboration with Janora Norlander, DO regarding development and update of comprehensive plan of care as evidenced by provider attestation and co-signature Inter-disciplinary care team collaboration (see longitudinal plan of care) Comprehensive medication review performed; medication list updated in electronic medical record  Diabetes: New goal. A1C 7%  (slight increase) Current treatment: Jardiance 25mg  daily (via [employer] cares patient assistance program) glipizide 5mg  twice daily with meals metformin 1g twice daily with meals;  Current glucose readings: fasting glucose: <130, post prandial glucose: <190 Denies hypoglycemic/hyperglycemic symptoms Discussed meal planning options and Plate method for healthy eating Avoid sugary drinks and desserts Incorporate balanced protein, non starchy veggies, 1 serving of carbohydrate with each meal Increase water intake Increase physical activity as able Current exercise: N/A Recommended Potentially stopping glipizide, decreasing metformin and adding a GLP1 at f/u if a1c continues to increase; would help with weight/lipids as well Assessed  patient finances. Patient enrolled in the [employer] cares patient assistance program for 2023--Jardiance to ship to home in 3 month increments  Blood pressure controlled; reviewed/documented  Hyperlipidemia:  New goal. Uncontrolled-LDL 89, GOAL <70; current treatment:LOVASTATIN 40MG , ZETIA 10MG ;  Medications previously tried: PRAVASTATIN STATIN INDUCED MYOPATHY--CHART REVIEW COMPLETED AND NO OTHER STATINS FOUND, PATIENT HAS TRIED SEVERAL OTHERS, BUT DOESN'T RECALL NAMES; STABLE ON CURRENT REGIMEN Current dietary patterns: REVIEWED HEART HEALTHY DIET Recommended HEART HEALTHY DIET BEFORE ADJUSTING REGIMEN; ICD10-G72 DOCUMENTED FOR STATIN INDUCED MYOPATHY Lipid Panel     Component Value Date/Time   CHOL 145 09/08/2019 1015   TRIG 156 (H) 09/08/2019 1015   HDL 37 (L) 09/08/2019 1015   CHOLHDL 3.9 09/08/2019 1015   LDLCALC 81 09/08/2019 1015   LDLDIRECT 89 06/03/2021 1324   LABVLDL 27 09/08/2019 1015    Patient Goals/Self-Care Activities patient will:  - take medications as prescribed as evidenced by patient report and record review check glucose 3X WEEKLY OR IF SYMPTOMATIC, document, and provide at future appointments check blood pressure 3X WEEKLY OR IF SYMPTOMATIC, document, and provide at future appointments collaborate with provider on medication access solutions target a minimum of 150 minutes of moderate intensity exercise weekly engage in dietary modifications by [place] DIET   Please call the care guide team at [phone] if you need to cancel or reschedule your appointment.    The patient verbalized understanding of instructions, educational materials, and care plan provided today and declined offer to receive copy of patient instructions, educational materials, and care plan.   Signature Regina Eck, PharmD, BCPS Clinical Pharmacist, [place]  [place] Phone [phone]

## 2021-06-19 NOTE — Progress Notes (Signed)
Received notification from [employer] [employer]) regarding approval for [employer]. Patient assistance approved from 06/17/21 to 05/31/22.  MEDICATION WILL SHIP TO PT'S HOME. PT CAN REQUEST REFILLS USING RX INFORMATION INCLUDED IN SHIPMENT  Phone: [phone]

## 2021-06-25 ENCOUNTER — Other Ambulatory Visit: Payer: Self-pay | Admitting: Family Medicine

## 2021-07-01 DIAGNOSIS — E785 Hyperlipidemia, unspecified: Secondary | ICD-10-CM

## 2021-07-01 DIAGNOSIS — E1169 Type 2 diabetes mellitus with other specified complication: Secondary | ICD-10-CM | POA: Diagnosis not present

## 2021-07-15 ENCOUNTER — Ambulatory Visit (INDEPENDENT_AMBULATORY_CARE_PROVIDER_SITE_OTHER): Payer: Medicare Other | Admitting: *Deleted

## 2021-07-15 DIAGNOSIS — E538 Deficiency of other specified B group vitamins: Secondary | ICD-10-CM | POA: Diagnosis not present

## 2021-07-15 NOTE — Progress Notes (Signed)
Patient in today for monthly B12 injection. 1000 mcg given in right deltoid. Patient tolerated well.

## 2021-07-21 ENCOUNTER — Other Ambulatory Visit: Payer: Self-pay | Admitting: Family Medicine

## 2021-07-21 ENCOUNTER — Other Ambulatory Visit: Payer: Medicare Other

## 2021-07-21 DIAGNOSIS — R300 Dysuria: Secondary | ICD-10-CM | POA: Diagnosis not present

## 2021-07-21 LAB — URINALYSIS, COMPLETE
Bilirubin, UA: NEGATIVE
Ketones, UA: NEGATIVE
Leukocytes,UA: NEGATIVE
Nitrite, UA: NEGATIVE
Protein,UA: NEGATIVE
Specific Gravity, UA: 1.01 (ref 1.005–1.030)
Urobilinogen, Ur: 0.2 mg/dL (ref 0.2–1.0)
pH, UA: 5 (ref 5.0–7.5)

## 2021-07-21 LAB — MICROSCOPIC EXAMINATION
Bacteria, UA: NONE SEEN
Renal Epithel, UA: NONE SEEN /hpf

## 2021-07-22 ENCOUNTER — Ambulatory Visit (INDEPENDENT_AMBULATORY_CARE_PROVIDER_SITE_OTHER): Payer: Medicare Other | Admitting: Nurse Practitioner

## 2021-07-22 ENCOUNTER — Encounter: Payer: Self-pay | Admitting: Nurse Practitioner

## 2021-07-22 DIAGNOSIS — R300 Dysuria: Secondary | ICD-10-CM

## 2021-07-22 MED ORDER — PHENAZOPYRIDINE HCL 95 MG PO TABS: 95.0000 mg | ORAL_TABLET | Freq: Three times a day (TID) | ORAL | 0 refills | Status: DC | PRN

## 2021-07-22 MED ORDER — CEPHALEXIN 500 MG PO CAPS: 500.0000 mg | ORAL_CAPSULE | Freq: Two times a day (BID) | ORAL | 0 refills | Status: DC

## 2021-07-22 NOTE — Progress Notes (Signed)
Acute Office Visit  Subjective:    Patient ID: Emily [name], female    DOB: Nov 23, 1950, 71 y.o.   MRN: [id number]  Chief Complaint  Patient presents with   Urinary Tract Infection    Urinary Tract Infection  This is a new problem. Episode onset: 4 days ago. The problem has been gradually worsening. The quality of the pain is described as aching. The pain is at a severity of 3/10. There has been no fever. She is Not sexually active. There is No history of pyelonephritis. Pertinent negatives include no chills, flank pain or nausea.    Past Medical History:  Diagnosis Date   Anemia    Arthritis    Atrial fibrillation, rapid ([place]) 10/27/2013   Bilateral carotid artery disease ([facility])    Diabetes mellitus without complication ([place])    Difficult intubation    states 'lady that did the sleep study told me I have the smallest airway she has ever seen in an adult"   Dyslipidemia 06/29/2017   Dysrhythmia    Hypertension    Hypothyroid    Obesity    Obstructive sleep apnea    on C Pap   PAF (paroxysmal atrial fibrillation) ([place])    a. newly dx in 09/2013; on eliquis    Past Surgical History:  Procedure Laterality Date   CATARACT EXTRACTION Left    CATARACT EXTRACTION W/PHACO Right 12/09/2015   Procedure: CATARACT EXTRACTION PHACO AND INTRAOCULAR LENS PLACEMENT ([place]);  Surgeon: Tonny Branch, MD;  Location: [facility];  Service: Ophthalmology;  Laterality: Right;  CDE:10.48   COLONOSCOPY W/ POLYPECTOMY     cyst on back of neck removed     DILATION AND CURETTAGE OF UTERUS     x 5   ENDARTERECTOMY Left 08/30/2015   Procedure: LEFT CAROYID ENDARTERECTOMY WITH XENOSURE BOVINE PERICARDIUM PATCH ANGIOPLASTY;  Surgeon: [name], MD;  Location: [facility];  Service: Vascular;  Laterality: Left;   EYE SURGERY     TONSILLECTOMY     UPPER GI ENDOSCOPY      Family History  Problem Relation Age of Onset   COPD Father    Heart failure Father    Heart disease Father    Emphysema Father     Arrhythmia Sister    Arrhythmia Sister    Arrhythmia Sister        had PPM also   Cancer Sister     Social History   Socioeconomic History   Marital status: Single    Spouse name: Not on file   Number of children: Not on file   Years of education: Not on file   Highest education level: Not on file  Occupational History   Not on file  Tobacco Use   Smoking status: Never   Smokeless tobacco: Never  Vaping Use   Vaping Use: Never used  Substance and Sexual Activity   Alcohol use: No    Alcohol/week: 0.0 standard drinks   Drug use: Never   Sexual activity: Never  Other Topics Concern   Not on file  Social History Narrative   Not on file   Social Determinants of Health   Financial Resource Strain: Not on file  Food Insecurity: Not on file  Transportation Needs: Not on file  Physical Activity: Not on file  Stress: Not on file  Social Connections: Not on file  Intimate Partner Violence: Not on file    Outpatient Medications Prior to Visit  Medication Sig Dispense Refill   aspirin  EC 81 MG tablet Take 81 mg by mouth daily.     bisacodyl (DULCOLAX) 5 MG EC tablet Take 5 mg by mouth daily.     diltiazem (CARDIZEM CD) 180 MG 24 hr capsule TAKE 1 CAPSULE BY MOUTH EVERY DAY 90 capsule 3   diltiazem (CARDIZEM) 30 MG tablet Take 30 mg by mouth as needed.     empagliflozin (JARDIANCE) 25 MG TABS tablet Take 1 tablet (25 mg total) by mouth daily before breakfast. 90 tablet 4   erythromycin ophthalmic ointment Place 1 application into the right eye in the morning and at bedtime. 3.5 g 0   ezetimibe (ZETIA) 10 MG tablet TAKE 1 TABLET BY MOUTH EVERY DAY 90 tablet 3   flecainide (TAMBOCOR) 100 MG tablet Take 1 tablet (100 mg total) by mouth 2 (two) times daily. 180 tablet 3   furosemide (LASIX) 20 MG tablet TAKE 1 TABLET (20 MG TOTAL) BY MOUTH DAILY AS NEEDED FOR FLUID. 90 tablet 0   glipiZIDE (GLUCOTROL) 5 MG tablet TAKE 1 TABLET BY MOUTH TWICE A DAY 180 tablet 0   ketoconazole  (NIZORAL) 2 % cream Apply 1 application topically daily. Prn intertrigo rash 60 g 1   levothyroxine (SYNTHROID) 112 MCG tablet TAKE 1 TABLET BY MOUTH EVERY OTHER DAY, ALTERNATING WITH 1 & 1/2 TABLETS EVERY OTHER DAY 112 tablet 0   losartan (COZAAR) 25 MG tablet Take 1 tablet (25 mg total) by mouth daily. 90 tablet 3   lovastatin (MEVACOR) 40 MG tablet Take 1 tablet (40 mg total) by mouth every evening. 90 tablet 3   metFORMIN (GLUCOPHAGE) 1000 MG tablet TAKE 1 TABLET (1,000 MG TOTAL) BY MOUTH 2 (TWO) TIMES DAILY WITH A MEAL. 180 tablet 0   metoprolol succinate (TOPROL-XL) 100 MG 24 hr tablet TAKE 1 TABLET BY MOUTH DAILY. TAKE WITH OR IMMEDIATELY FOLLOWING A MEAL. 90 tablet 3   rOPINIRole (REQUIP) 0.5 MG tablet TAKE 1 TABLET BY MOUTH EVERYDAY AT BEDTIME 90 tablet 0   amoxicillin-clavulanate (AUGMENTIN) 875-125 MG tablet Take 1 tablet by mouth 2 (two) times daily. 20 tablet 0   Facility-Administered Medications Prior to Visit  Medication Dose Route Frequency Provider Last Rate Last Admin   cyanocobalamin ((VITAMIN B-12)) injection 1,000 mcg  1,000 mcg Intramuscular Q30 days Timmothy Euler, MD   1,000 mcg at 07/15/21 1937    Allergies  Allergen Reactions   Quinapril Hcl Cough   Statins Other (See Comments)    Not all Statins but some cause cough and pain in legs.   Tape Other (See Comments)    Redness, please use "paper" tape    Review of Systems  Constitutional:  Negative for chills.  Eyes: Negative.   Respiratory: Negative.    Gastrointestinal:  Negative for nausea.  Genitourinary:  Positive for dysuria. Negative for flank pain.  All other systems reviewed and are negative.     Objective:    Physical Exam Vitals and nursing note reviewed.  Constitutional:      Appearance: Normal appearance.  HENT:     Head: Normocephalic.     Right Ear: External ear normal.     Left Ear: External ear normal.     Nose: Nose normal.     Mouth/Throat:     Mouth: Mucous membranes are moist.      Pharynx: Oropharynx is clear.  Cardiovascular:     Rate and Rhythm: Normal rate and regular rhythm.     Pulses: Normal pulses.     Heart  sounds: Normal heart sounds.  Pulmonary:     Effort: Pulmonary effort is normal.     Breath sounds: Normal breath sounds.  Abdominal:     General: Bowel sounds are normal. There is no distension.     Tenderness: There is abdominal tenderness. There is no right CVA tenderness, left CVA tenderness or guarding.  Neurological:     Mental Status: She is alert and oriented to person, place, and time.  Psychiatric:        Behavior: Behavior normal.    There were no vitals taken for this visit. Wt Readings from Last 3 Encounters:  06/04/21 220 lb 6.4 oz (100 kg)  06/03/21 220 lb 6.4 oz (100 kg)  05/23/21 218 lb (98.9 kg)    Health Maintenance Due  Topic Date Due   COVID-19 Vaccine (4 - Booster for Pfizer series) 05/08/2020    There are no preventive care reminders to display for this patient.   Lab Results  Component Value Date   TSH 0.802 08/30/2020   Lab Results  Component Value Date   WBC 9.9 06/03/2021   HGB 12.7 06/03/2021   HCT 38.3 06/03/2021   MCV 89 06/03/2021   PLT 247 06/03/2021   Lab Results  Component Value Date   NA 139 06/03/2021   K 4.8 06/03/2021   CO2 23 06/03/2021   GLUCOSE 160 (H) 06/03/2021   BUN 22 06/03/2021   CREATININE 0.81 06/03/2021   BILITOT 1.4 (H) 08/30/2020   ALKPHOS 93 08/30/2020   AST 31 08/30/2020   ALT 24 08/30/2020   PROT 7.2 08/30/2020   ALBUMIN 3.9 08/30/2020   CALCIUM 9.1 06/03/2021   ANIONGAP 13 08/30/2020   EGFR 78 06/03/2021   Lab Results  Component Value Date   CHOL 145 09/08/2019   Lab Results  Component Value Date   HDL 37 (L) 09/08/2019   Lab Results  Component Value Date   LDLCALC 81 09/08/2019   Lab Results  Component Value Date   TRIG 156 (H) 09/08/2019   Lab Results  Component Value Date   CHOLHDL 3.9 09/08/2019   Lab Results  Component Value Date    HGBA1C 7.0 (H) 06/03/2021       Assessment & Plan:   UTI  vs  interstitial cystitis -urinalysis completed results shows abnormal urinalysis, cultures sent and results pending -pyridium for pain -Keflex 500 mg 2x/daily Precaution and education provided -All questions answered -follow up with unresolved symptoms  Problem List Items Addressed This Visit   None Visit Diagnoses     Dysuria    -  Primary   Relevant Medications   phenazopyridine (PYRIDIUM) 95 MG tablet   cephALEXin (KEFLEX) 500 MG capsule   Other Relevant Orders   CULTURE, URINE COMPREHENSIVE        Meds ordered this encounter  Medications   phenazopyridine (PYRIDIUM) 95 MG tablet    Sig: Take 1 tablet (95 mg total) by mouth 3 (three) times daily as needed for pain.    Dispense:  10 tablet    Refill:  0    Order Specific Question:   Supervising Provider    Answer:   Claretta Fraise [[id number]]   cephALEXin (KEFLEX) 500 MG capsule    Sig: Take 1 capsule (500 mg total) by mouth 2 (two) times daily.    Dispense:  14 capsule    Refill:  0    Order Specific Question:   Supervising Provider    Answer:   Claretta Fraise [  [place] Augusta, NP

## 2021-07-23 LAB — URINE CULTURE

## 2021-07-27 ENCOUNTER — Other Ambulatory Visit: Payer: Self-pay | Admitting: Family Medicine

## 2021-08-05 ENCOUNTER — Telehealth: Payer: Medicare Other

## 2021-08-12 ENCOUNTER — Ambulatory Visit (INDEPENDENT_AMBULATORY_CARE_PROVIDER_SITE_OTHER): Payer: Medicare Other

## 2021-08-12 DIAGNOSIS — E538 Deficiency of other specified B group vitamins: Secondary | ICD-10-CM

## 2021-08-12 NOTE — Progress Notes (Signed)
Cyanocobalamin injection given to left deltoid.  Patient tolerated well. 

## 2021-08-27 ENCOUNTER — Other Ambulatory Visit: Payer: Self-pay | Admitting: Cardiology

## 2021-09-09 ENCOUNTER — Encounter (HOSPITAL_COMMUNITY): Payer: Self-pay | Admitting: Hematology

## 2021-09-09 ENCOUNTER — Ambulatory Visit (INDEPENDENT_AMBULATORY_CARE_PROVIDER_SITE_OTHER): Payer: Medicare Other | Admitting: *Deleted

## 2021-09-09 ENCOUNTER — Ambulatory Visit (HOSPITAL_COMMUNITY): Payer: Medicare Other | Attending: Cardiology

## 2021-09-09 DIAGNOSIS — E538 Deficiency of other specified B group vitamins: Secondary | ICD-10-CM | POA: Diagnosis not present

## 2021-09-09 DIAGNOSIS — I350 Nonrheumatic aortic (valve) stenosis: Secondary | ICD-10-CM | POA: Insufficient documentation

## 2021-09-09 LAB — ECHOCARDIOGRAM COMPLETE
AR max vel: 1.19 cm2
AV Area VTI: 1.42 cm2
AV Area mean vel: 1.23 cm2
AV Mean grad: 21 mmHg
AV Peak grad: 36.7 mmHg
Ao pk vel: 3.03 m/s
Area-P 1/2: 1.65 cm2
MV VTI: 1.45 cm2
S' Lateral: 3 cm

## 2021-09-09 NOTE — Progress Notes (Signed)
Patient in today for monthly B12 injection. 1000 mcg administered in right deltoid. Patient tolerated well.  ?

## 2021-09-11 ENCOUNTER — Telehealth: Payer: Medicare Other

## 2021-09-12 ENCOUNTER — Other Ambulatory Visit: Payer: Self-pay | Admitting: Family Medicine

## 2021-09-16 DIAGNOSIS — Z1231 Encounter for screening mammogram for malignant neoplasm of breast: Secondary | ICD-10-CM | POA: Diagnosis not present

## 2021-09-19 ENCOUNTER — Other Ambulatory Visit: Payer: Self-pay | Admitting: Family Medicine

## 2021-09-22 NOTE — Telephone Encounter (Signed)
OV 09/30/21 ?

## 2021-09-29 ENCOUNTER — Encounter (HOSPITAL_COMMUNITY): Payer: Self-pay | Admitting: Hematology

## 2021-09-30 ENCOUNTER — Encounter: Payer: Self-pay | Admitting: Family Medicine

## 2021-09-30 ENCOUNTER — Ambulatory Visit (INDEPENDENT_AMBULATORY_CARE_PROVIDER_SITE_OTHER): Payer: Medicare Other | Admitting: Family Medicine

## 2021-09-30 VITALS — BP 140/50 | HR 45 | Temp 97.3°F | Ht 62.0 in | Wt 217.0 lb

## 2021-09-30 DIAGNOSIS — I480 Paroxysmal atrial fibrillation: Secondary | ICD-10-CM

## 2021-09-30 DIAGNOSIS — E1169 Type 2 diabetes mellitus with other specified complication: Secondary | ICD-10-CM | POA: Diagnosis not present

## 2021-09-30 DIAGNOSIS — E1159 Type 2 diabetes mellitus with other circulatory complications: Secondary | ICD-10-CM

## 2021-09-30 DIAGNOSIS — E034 Atrophy of thyroid (acquired): Secondary | ICD-10-CM

## 2021-09-30 DIAGNOSIS — E538 Deficiency of other specified B group vitamins: Secondary | ICD-10-CM | POA: Diagnosis not present

## 2021-09-30 DIAGNOSIS — I152 Hypertension secondary to endocrine disorders: Secondary | ICD-10-CM | POA: Diagnosis not present

## 2021-09-30 DIAGNOSIS — E785 Hyperlipidemia, unspecified: Secondary | ICD-10-CM | POA: Diagnosis not present

## 2021-09-30 LAB — BAYER DCA HB A1C WAIVED: HB A1C (BAYER DCA - WAIVED): 6.4 % — ABNORMAL HIGH (ref 4.8–5.6)

## 2021-09-30 NOTE — Patient Instructions (Signed)
FAST for lab ?Will reach out to [facility] about Ozempic. ?

## 2021-09-30 NOTE — Progress Notes (Signed)
? ?Subjective: ?CC:DM ?PCP: Janora Norlander, DO ?DDU:KGURK Beharry is a 71 y.o. female presenting to clinic today for: ? ?1. Type 2 Diabetes with hypertension, hyperlipidemia and atrial fibrillation:  ?Patient reports compliance with all medications.  She is currently receiving Jardiance 25 mg daily through the manufacture. ? ?Last eye exam: Up-to-date ?Last foot exam: Up-to-date ?Last A1c:  ?Lab Results  ?Component Value Date  ? HGBA1C 6.4 (H) 09/30/2021  ? ?Nephropathy screen indicated?:  Up-to-date ?Last flu, zoster and/or pneumovax:  ?Immunization History  ?Administered Date(s) Administered  ? Fluad Quad(high Dose 65+) 02/28/2019, 03/05/2020  ? Influenza, High Dose Seasonal PF 03/24/2016, 03/22/2018  ? Influenza, Seasonal, Injecte, Preservative Fre 06/05/2014, 07/02/2015  ? Influenza-Unspecified 03/24/2016  ? PFIZER(Purple Top)SARS-COV-2 Vaccination 07/10/2019, 08/03/2019, 03/13/2020  ? Pneumococcal Conjugate-13 07/09/2015  ? Pneumococcal Polysaccharide-23 01/30/2014, 02/28/2019  ? Tdap 05/26/2014  ? ? ?ROS: Continues to have quite a bit of vaginal pruritus and irritation.  She is on Jardiance for heart disease along with metformin.  She does not report any discoloration or bleeding but has really been suffering with the pruritus ? ?2.  Hypothyroidism ?Patient is compliant with Synthroid 112 mcg alternating with 1-1/2 tablets every other day.  No reports of tremor, heart palpitations or unplanned weight loss ? ? ?ROS: Per HPI ? ?Allergies  ?Allergen Reactions  ? Quinapril Hcl Cough  ? Statins Other (See Comments)  ?  Not all Statins but some cause cough and pain in legs.  ? Tape Other (See Comments)  ?  Redness, please use "paper" tape  ? ?Past Medical History:  ?Diagnosis Date  ? Anemia   ? Arthritis   ? Atrial fibrillation, rapid ([place]) 10/27/2013  ? Bilateral carotid artery disease ([place])   ? Diabetes mellitus without complication ([place])   ? Difficult intubation   ? states 'lady that did the sleep study  told me I have the smallest airway she has ever seen in an adult"  ? Dyslipidemia 06/29/2017  ? Dysrhythmia   ? Hypertension   ? Hypothyroid   ? Obesity   ? Obstructive sleep apnea   ? on C Pap  ? PAF (paroxysmal atrial fibrillation) ([place])   ? a. newly dx in 09/2013; on eliquis  ? ? ?Current Outpatient Medications:  ?  aspirin EC 81 MG tablet, Take 81 mg by mouth daily., Disp: , Rfl:  ?  bisacodyl (DULCOLAX) 5 MG EC tablet, Take 5 mg by mouth daily., Disp: , Rfl:  ?  diltiazem (CARDIZEM CD) 180 MG 24 hr capsule, TAKE 1 CAPSULE BY MOUTH EVERY DAY, Disp: 90 capsule, Rfl: 2 ?  diltiazem (CARDIZEM) 30 MG tablet, Take 30 mg by mouth as needed., Disp: , Rfl:  ?  erythromycin ophthalmic ointment, Place 1 application into the right eye in the morning and at bedtime., Disp: 3.5 g, Rfl: 0 ?  ezetimibe (ZETIA) 10 MG tablet, TAKE 1 TABLET BY MOUTH EVERY DAY, Disp: 90 tablet, Rfl: 3 ?  flecainide (TAMBOCOR) 100 MG tablet, Take 1 tablet (100 mg total) by mouth 2 (two) times daily., Disp: 180 tablet, Rfl: 3 ?  furosemide (LASIX) 20 MG tablet, TAKE 1 TABLET (20 MG TOTAL) BY MOUTH DAILY AS NEEDED FOR FLUID., Disp: 90 tablet, Rfl: 0 ?  glipiZIDE (GLUCOTROL) 5 MG tablet, TAKE 1 TABLET BY MOUTH TWICE A DAY, Disp: 180 tablet, Rfl: 0 ?  ketoconazole (NIZORAL) 2 % cream, Apply 1 application topically daily. Prn intertrigo rash, Disp: 60 g, Rfl: 1 ?  levothyroxine (SYNTHROID)  112 MCG tablet, TAKE 1 TABLET BY MOUTH EVERY OTHER DAY, ALTERNATING WITH 1 & 1/2 TABLETS EVERY OTHER DAY, Disp: 112 tablet, Rfl: 0 ?  losartan (COZAAR) 25 MG tablet, Take 1 tablet (25 mg total) by mouth daily., Disp: 90 tablet, Rfl: 3 ?  lovastatin (MEVACOR) 40 MG tablet, Take 1 tablet (40 mg total) by mouth every evening., Disp: 90 tablet, Rfl: 3 ?  metFORMIN (GLUCOPHAGE) 1000 MG tablet, TAKE 1 TABLET (1,000 MG TOTAL) BY MOUTH TWICE A DAY WITH FOOD, Disp: 180 tablet, Rfl: 0 ?  metoprolol succinate (TOPROL-XL) 100 MG 24 hr tablet, TAKE 1 TABLET BY MOUTH DAILY. TAKE WITH  OR IMMEDIATELY FOLLOWING A MEAL., Disp: 90 tablet, Rfl: 3 ?  phenazopyridine (PYRIDIUM) 95 MG tablet, Take 1 tablet (95 mg total) by mouth 3 (three) times daily as needed for pain., Disp: 10 tablet, Rfl: 0 ?  rOPINIRole (REQUIP) 0.5 MG tablet, TAKE 1 TABLET BY MOUTH EVERYDAY AT BEDTIME, Disp: 90 tablet, Rfl: 0 ? ?Current Facility-Administered Medications:  ?  cyanocobalamin ((VITAMIN B-12)) injection 1,000 mcg, 1,000 mcg, Intramuscular, Q30 days, Timmothy Euler, MD, 1,000 mcg at 09/09/21 0857 ?Social History  ? ?Socioeconomic History  ? Marital status: Single  ?  Spouse name: Not on file  ? Number of children: Not on file  ? Years of education: Not on file  ? Highest education level: Not on file  ?Occupational History  ? Not on file  ?Tobacco Use  ? Smoking status: Never  ? Smokeless tobacco: Never  ?Vaping Use  ? Vaping Use: Never used  ?Substance and Sexual Activity  ? Alcohol use: No  ?  Alcohol/week: 0.0 standard drinks  ? Drug use: Never  ? Sexual activity: Never  ?Other Topics Concern  ? Not on file  ?Social History Narrative  ? Not on file  ? ?Social Determinants of Health  ? ?Financial Resource Strain: Not on file  ?Food Insecurity: Not on file  ?Transportation Needs: Not on file  ?Physical Activity: Not on file  ?Stress: Not on file  ?Social Connections: Not on file  ?Intimate Partner Violence: Not on file  ? ?Family History  ?Problem Relation Age of Onset  ? COPD Father   ? Heart failure Father   ? Heart disease Father   ? Emphysema Father   ? Arrhythmia Sister   ? Arrhythmia Sister   ? Arrhythmia Sister   ?     had PPM also  ? Cancer Sister   ? ? ?Objective: ?Office vital signs reviewed. ?BP (!) 145/51   Pulse (!) 45   Temp (!) 97.3 ?F (36.3 ?C)   Ht '5\' 2"'$  (1.575 m)   Wt 217 lb (98.4 kg)   SpO2 96%   BMI 39.69 kg/m?  ? ?Physical Examination:  ?General: Awake, alert, morbidly obese, No acute distress ?HEENT: No exopthalamos.  No goiter ?Cardio: Bradycardic with regular rhythm ?Pulm: clear to  auscultation bilaterally, no wheezes, rhonchi or rales; normal work of breathing on room air ?Neuro: No tremor ? ?Assessment/ Plan: ?71 y.o. female  ? ?Type 2 diabetes mellitus with hyperlipidemia ([place]) - Plan: Bayer DCA Hb A1c Waived ? ?Hypertension associated with diabetes ([place]) ? ?PAF (paroxysmal atrial fibrillation) ([place]) ? ?Vitamin B12 deficiency - Plan: Vitamin B12 ? ?Hypothyroidism due to acquired atrophy of thyroid ? ?Sugar shows excellent control but I do have concerns about ongoing and relatively uncomfortable vaginitis secondary to the Jardiance use.  Well excellent for reducing heart disease, I do wonder if  she might benefit better from a GLP and have less vaginitis symptoms.  I will CC to Dr. Percival Spanish as Juluis Rainier as I am strongly considering switching her off of the Jardiance and onto something like Ozempic.  I will also CC to [facility] for patient assistance with this medication and some ongoing monitoring once we make this transition ? ?Her blood pressure was controlled for age ? ?She had rate control and seemingly rhythm control and exam ? ?We will recheck her vitamin B12 level ? ?Asymptomatic from a thyroid standpoint.  Not yet due for thyroid labs ? ?Orders Placed This Encounter  ?Procedures  ? Bayer DCA Hb A1c Waived  ? Vitamin B12  ?  Standing Status:   Future  ?  Standing Expiration Date:   10/01/2022  ? ?No orders of the defined types were placed in this encounter. ? ? ? ?Janora Norlander, DO ?[place] ?[phone] ? ?

## 2021-10-09 ENCOUNTER — Other Ambulatory Visit: Payer: Self-pay | Admitting: Family Medicine

## 2021-10-09 ENCOUNTER — Other Ambulatory Visit: Payer: Self-pay | Admitting: Cardiology

## 2021-10-09 NOTE — Progress Notes (Signed)
?HISTORY AND PHYSICAL  ? ? ? ?CC:  follow up. ?Requesting Provider:  Janora Norlander, DO ? ?HPI: This is a 71 y.o. female here for follow up for carotid artery stenosis.  Pt is s/p left CEA for asymptomatic carotid artery stenosis in March 2017 by Dr. Trula Slade.  She continues on 6 month surveillance for 60-79% right ICA stenosis.    ? ?Pt was last seen 04/01/2021 and at that time she was doing well without neurologic symptoms.  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   ? ?She has a hx of having a Watchman procedure in February 2020 at [facility].  She is followed by Dr. Percival Spanish here in [place].  She has a hx of 2 bleeding gastric polyps that had been addressed by GI in [place]. ? ?Pt returns today for follow up.   ? ?Pt denies any amaurosis fugax, speech difficulties, weakness, numbness, paralysis or clumsiness or facial droop.  She denies any claudication or rest pain or non healing wounds.  Says if she walks a while, her legs get weak.  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  She stopped taking new customers about 10 years ago.  She is compliant with here statin and asa.  ? ?The pt is on a statin for cholesterol management.  ?The pt is on a daily aspirin.   Other AC:  none ?The pt is on CCB, ARB, diuretic, BB for hypertension.   ?The pt does  have diabetes ?Tobacco hx:  never ? ?Pt does not have family hx of AAA. ? ?Past Medical History:  ?Diagnosis Date  ? Anemia   ? Arthritis   ? Atrial fibrillation, rapid ([place]) 10/27/2013  ? Bilateral carotid artery disease ([place])   ? Diabetes mellitus without complication ([place])   ? Difficult intubation   ? states 'lady that did the sleep study told me I have the smallest airway she has ever seen in an adult"  ? Dyslipidemia 06/29/2017  ? Dysrhythmia   ? Hypertension   ? Hypothyroid   ? Obesity   ? Obstructive sleep apnea   ? on C Pap  ? PAF (paroxysmal atrial fibrillation) ([place])   ? a.  newly dx in 09/2013; on eliquis  ? ? ?Past Surgical History:  ?Procedure Laterality Date  ? CATARACT EXTRACTION Left   ? CATARACT EXTRACTION W/PHACO Right 12/09/2015  ? Procedure: CATARACT EXTRACTION PHACO AND INTRAOCULAR LENS PLACEMENT (IOC);  Surgeon: Tonny Branch, MD;  Location: [facility];  Service: Ophthalmology;  Laterality: Right;  CDE:10.48  ? COLONOSCOPY W/ POLYPECTOMY    ? cyst on back of neck removed    ? DILATION AND CURETTAGE OF UTERUS    ? x 5  ? ENDARTERECTOMY Left 08/30/2015  ? Procedure: LEFT CAROYID ENDARTERECTOMY WITH XENOSURE BOVINE PERICARDIUM PATCH ANGIOPLASTY;  Surgeon: [name], MD;  Location: [place];  Service: Vascular;  Laterality: Left;  ? EYE SURGERY    ? TONSILLECTOMY    ? UPPER GI ENDOSCOPY    ? ? ?Allergies  ?Allergen Reactions  ? Quinapril Hcl Cough  ? Statins Other (See Comments)  ?  Not all Statins but some cause cough and pain in legs.  ? Tape Other (See Comments)  ?  Redness, please use "paper" tape  ? ? ?Current Outpatient Medications  ?Medication Sig Dispense Refill  ? aspirin EC 81 MG tablet Take 81 mg by  mouth daily.    ? bisacodyl (DULCOLAX) 5 MG EC tablet Take 5 mg by mouth daily.    ? diltiazem (CARDIZEM CD) 180 MG 24 hr capsule TAKE 1 CAPSULE BY MOUTH EVERY DAY 90 capsule 2  ? diltiazem (CARDIZEM) 30 MG tablet Take 30 mg by mouth as needed.    ? erythromycin ophthalmic ointment Place 1 application into the right eye in the morning and at bedtime. 3.5 g 0  ? ezetimibe (ZETIA) 10 MG tablet TAKE 1 TABLET BY MOUTH EVERY DAY 90 tablet 3  ? flecainide (TAMBOCOR) 100 MG tablet Take 1 tablet (100 mg total) by mouth 2 (two) times daily. 180 tablet 3  ? furosemide (LASIX) 20 MG tablet TAKE 1 TABLET (20 MG TOTAL) BY MOUTH DAILY AS NEEDED FOR FLUID. 90 tablet 0  ? glipiZIDE (GLUCOTROL) 5 MG tablet TAKE 1 TABLET BY MOUTH TWICE A DAY 180 tablet 0  ? ketoconazole (NIZORAL) 2 % cream Apply 1 application topically daily. Prn intertrigo rash 60 g 1  ? levothyroxine (SYNTHROID) 112 MCG tablet  TAKE 1 TABLET BY MOUTH EVERY OTHER DAY, ALTERNATING WITH 1 & 1/2 TABLETS EVERY OTHER DAY 112 tablet 0  ? losartan (COZAAR) 25 MG tablet Take 1 tablet (25 mg total) by mouth daily. 90 tablet 3  ? lovastatin (MEVACOR) 40 MG tablet Take 1 tablet (40 mg total) by mouth every evening. 90 tablet 3  ? metFORMIN (GLUCOPHAGE) 1000 MG tablet TAKE 1 TABLET (1,000 MG TOTAL) BY MOUTH TWICE A DAY WITH FOOD 180 tablet 0  ? metoprolol succinate (TOPROL-XL) 100 MG 24 hr tablet TAKE 1 TABLET BY MOUTH DAILY. TAKE WITH OR IMMEDIATELY FOLLOWING A MEAL. 90 tablet 1  ? phenazopyridine (PYRIDIUM) 95 MG tablet Take 1 tablet (95 mg total) by mouth 3 (three) times daily as needed for pain. 10 tablet 0  ? rOPINIRole (REQUIP) 0.5 MG tablet TAKE 1 TABLET BY MOUTH EVERYDAY AT BEDTIME 90 tablet 0  ? ?Current Facility-Administered Medications  ?Medication Dose Route Frequency Provider Last Rate Last Admin  ? cyanocobalamin ((VITAMIN B-12)) injection 1,000 mcg  1,000 mcg Intramuscular Q30 days Timmothy Euler, MD   1,000 mcg at 09/09/21 0857  ? ? ?Family History  ?Problem Relation Age of Onset  ? COPD Father   ? Heart failure Father   ? Heart disease Father   ? Emphysema Father   ? Arrhythmia Sister   ? Arrhythmia Sister   ? Arrhythmia Sister   ?     had PPM also  ? Cancer Sister   ? ? ?Social History  ? ?Socioeconomic History  ? Marital status: Single  ?  Spouse name: Not on file  ? Number of children: Not on file  ? Years of education: Not on file  ? Highest education level: Not on file  ?Occupational History  ? Not on file  ?Tobacco Use  ? Smoking status: Never  ? Smokeless tobacco: Never  ?Vaping Use  ? Vaping Use: Never used  ?Substance and Sexual Activity  ? Alcohol use: No  ?  Alcohol/week: 0.0 standard drinks  ? Drug use: Never  ? Sexual activity: Never  ?Other Topics Concern  ? Not on file  ?Social History Narrative  ? Not on file  ? ?Social Determinants of Health  ? ?Financial Resource Strain: Not on file  ?Food Insecurity: Not on file   ?Transportation Needs: Not on file  ?Physical Activity: Not on file  ?Stress: Not on file  ?Social Connections: Not  on file  ?Intimate Partner Violence: Not on file  ? ? ? ?REVIEW OF SYSTEMS:  ? ?'[X]'$  denotes positive finding, '[ ]'$  denotes negative finding ?Cardiac  Comments:  ?Chest pain or chest pressure:    ?Shortness of breath upon exertion:    ?Short of breath when lying flat:    ?Irregular heart rhythm:    ?    ?Vascular    ?Pain in calf, thigh, or hip brought on by ambulation:    ?Pain in feet at night that wakes you up from your sleep:     ?Blood clot in your veins:    ?Leg swelling:     ?    ?Pulmonary    ?Oxygen at home:    ?Productive cough:     ?Wheezing:     ?    ?Neurologic    ?Sudden weakness in arms or legs:     ?Sudden numbness in arms or legs:     ?Sudden onset of difficulty speaking or slurred speech:    ?Temporary loss of vision in one eye:     ?Problems with dizziness:     ?    ?Gastrointestinal    ?Blood in stool:     ?Vomited blood:     ?    ?Genitourinary    ?Burning when urinating:     ?Blood in urine:    ?    ?Psychiatric    ?Major depression:     ?    ?Hematologic    ?Bleeding problems:    ?Problems with blood clotting too easily:    ?    ?Skin    ?Rashes or ulcers:    ?    ?Constitutional    ?Fever or chills:    ? ? ?PHYSICAL EXAMINATION: ? ?Today's Vitals  ? 10/13/21 1210 10/13/21 1214  ?BP: (!) 140/57 (!) 139/53  ?Pulse: (!) 44 (!) 43  ?Resp: 14   ?Temp: (!) 97.4 ?F (36.3 ?C)   ?TempSrc: Temporal   ?SpO2: 97%   ?Weight: 218 lb (98.9 kg)   ?Height: '5\' 2"'$  (1.575 m)   ? ?Body mass index is 39.87 kg/m?. ? ? ?General:  WDWN in NAD; vital signs documented above ?Gait: Not observed ?HENT: WNL, normocephalic ?Pulmonary: normal non-labored breathing ?Cardiac: regular HR, without carotid bruits ?Abdomen: soft, NT; aortic pulse is not palpable ?Skin: without rashes ?Vascular Exam/Pulses: ? Right Left  ?Radial 2+ (normal) 2+ (normal)  ?Popliteal Unable to palpate Unable to palpate  ?DP 1+ (weak) 1+  (weak)  ?PT Unable to palpate Unable to palpate  ? ?Extremities: without ischemic changes, without Gangrene , without cellulitis; without open wounds ?Musculoskeletal: no muscle wasting or atrophy  ?Neuro

## 2021-10-13 ENCOUNTER — Ambulatory Visit (HOSPITAL_COMMUNITY)
Admission: RE | Admit: 2021-10-13 | Discharge: 2021-10-13 | Disposition: A | Discharge status: Home / Self Care | Payer: Medicare Other | Source: Ambulatory Visit | Attending: Surgery | Admitting: Surgery

## 2021-10-13 ENCOUNTER — Ambulatory Visit: Payer: Medicare Other | Admitting: Physician Assistant

## 2021-10-13 VITALS — BP 139/53 | HR 43 | Temp 97.4°F | Resp 14 | Ht 62.0 in | Wt 218.0 lb

## 2021-10-13 DIAGNOSIS — I6523 Occlusion and stenosis of bilateral carotid arteries: Secondary | ICD-10-CM

## 2021-10-14 ENCOUNTER — Ambulatory Visit (INDEPENDENT_AMBULATORY_CARE_PROVIDER_SITE_OTHER): Payer: Medicare Other

## 2021-10-14 DIAGNOSIS — E538 Deficiency of other specified B group vitamins: Secondary | ICD-10-CM

## 2021-10-14 NOTE — Progress Notes (Signed)
B12 injection given to patient and patient tolerated well.  ?

## 2021-10-16 ENCOUNTER — Other Ambulatory Visit: Payer: Self-pay | Admitting: *Deleted

## 2021-10-16 ENCOUNTER — Ambulatory Visit: Payer: Medicare Other | Admitting: Family Medicine

## 2021-10-16 DIAGNOSIS — I6523 Occlusion and stenosis of bilateral carotid arteries: Secondary | ICD-10-CM

## 2021-10-20 NOTE — Progress Notes (Unsigned)
Cardiology Office Note   Date:  10/22/2021   ID:  Emily [name], DOB 12/07/1950, MRN [id number]  PCP:  Janora Norlander, DO  Cardiologist:   Minus Breeding, MD   Chief Complaint  Patient presents with   Aortic Stenosis           History of Present Illness: Emily [name] is a 71 y.o. female who presents for followup of atrial fib.   The patient has paroxysmal atrial fibrillation and was treated with flecainide. She has had recurrent GI bleeding and could not tolerate anticoagulation. She has had transfusions.  She subsequently had double-balloon enteroscopy.   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  She did have Watchman.  She has moderate AS.   She had atrial fib with RVR.and was in the hospital in April.   She spontaneously converted to NSR after IV Dilt.  She had a UTI.  She did have a troponin that went up to 1738.  Echocardiogram was essentially unremarkable except for the moderate left ventricular hypertrophy and aortic stenosis.  She has LVH as well.  There were no wall motion abnormalities.  She did have a stress perfusion study which suggested possibly fixed defect in the apex with some mild peri-infarct ischemia.  We managed this medically.  She has required increased doses of flecainide for management of her atrial fib.    She has been doing well.  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 The patient denies any new symptoms such as chest discomfort, neck or arm discomfort. There has been no new shortness of breath, PND or orthopnea. There have been no reported palpitations, presyncope or syncope.  She has had some baseline dyspnea with exertion but this is not changed from previous.    Past Medical History:  Diagnosis Date   Anemia    Arthritis    Atrial fibrillation, rapid ([place]) 10/27/2013   Bilateral carotid artery disease ([facility])    Diabetes mellitus without complication ([place])    Difficult intubation     states 'lady that did the sleep study told me I have the smallest airway she has ever seen in an adult"   Dyslipidemia 06/29/2017   Dysrhythmia    Hypertension    Hypothyroid    Obesity    Obstructive sleep apnea    on C Pap   PAF (paroxysmal atrial fibrillation) ([place])    a. newly dx in 09/2013; on eliquis    Past Surgical History:  Procedure Laterality Date   CATARACT EXTRACTION Left    CATARACT EXTRACTION W/PHACO Right 12/09/2015   Procedure: CATARACT EXTRACTION PHACO AND INTRAOCULAR LENS PLACEMENT ([place]);  Surgeon: Tonny Branch, MD;  Location: [facility];  Service: Ophthalmology;  Laterality: Right;  CDE:10.48   COLONOSCOPY W/ POLYPECTOMY     cyst on back of neck removed     DILATION AND CURETTAGE OF UTERUS     x 5   ENDARTERECTOMY Left 08/30/2015   Procedure: LEFT CAROYID ENDARTERECTOMY WITH XENOSURE BOVINE PERICARDIUM PATCH ANGIOPLASTY;  Surgeon: [name], MD;  Location: [facility];  Service: Vascular;  Laterality: Left;   EYE SURGERY     TONSILLECTOMY     UPPER GI ENDOSCOPY       Current Outpatient Medications  Medication Sig Dispense Refill   aspirin EC 81 MG tablet Take 81 mg by mouth daily.     bisacodyl (DULCOLAX) 5 MG EC tablet Take 5 mg by mouth  daily.     diltiazem (CARDIZEM CD) 180 MG 24 hr capsule TAKE 1 CAPSULE BY MOUTH EVERY DAY 90 capsule 2   diltiazem (CARDIZEM) 30 MG tablet Take 30 mg by mouth as needed.     ezetimibe (ZETIA) 10 MG tablet TAKE 1 TABLET BY MOUTH EVERY DAY 90 tablet 3   flecainide (TAMBOCOR) 100 MG tablet Take 1 tablet (100 mg total) by mouth 2 (two) times daily. 180 tablet 3   furosemide (LASIX) 20 MG tablet TAKE 1 TABLET (20 MG TOTAL) BY MOUTH DAILY AS NEEDED FOR FLUID. 90 tablet 0   glipiZIDE (GLUCOTROL) 5 MG tablet TAKE 1 TABLET BY MOUTH TWICE A DAY 180 tablet 0   ketoconazole (NIZORAL) 2 % cream Apply 1 application topically daily. Prn intertrigo rash 60 g 1   levothyroxine (SYNTHROID) 112 MCG tablet TAKE 1 TABLET BY MOUTH EVERY OTHER DAY,  ALTERNATING WITH 1 & 1/2 TABLETS EVERY OTHER DAY 112 tablet 0   losartan (COZAAR) 25 MG tablet TAKE 1 TABLET (25 MG TOTAL) BY MOUTH DAILY. 30 tablet 0   lovastatin (MEVACOR) 40 MG tablet Take 1 tablet (40 mg total) by mouth every evening. 90 tablet 3   metFORMIN (GLUCOPHAGE) 1000 MG tablet TAKE 1 TABLET (1,000 MG TOTAL) BY MOUTH TWICE A DAY WITH FOOD 180 tablet 0   metoprolol succinate (TOPROL-XL) 100 MG 24 hr tablet TAKE 1 TABLET BY MOUTH DAILY. TAKE WITH OR IMMEDIATELY FOLLOWING A MEAL. 90 tablet 1   rOPINIRole (REQUIP) 0.5 MG tablet TAKE 1 TABLET BY MOUTH EVERYDAY AT BEDTIME 90 tablet 0   Current Facility-Administered Medications  Medication Dose Route Frequency Provider Last Rate Last Admin   cyanocobalamin ((VITAMIN B-12)) injection 1,000 mcg  1,000 mcg Intramuscular Q30 days Timmothy Euler, MD   1,000 mcg at 10/14/21 3825    Allergies:   Quinapril hcl, Statins, and Tape   ROS:  Please see the history of present illness.   Otherwise, review of systems are positive for none.   All other systems are reviewed and negative.    PHYSICAL EXAM: VS:  BP 122/60   Pulse (!) 51   Ht '5\' 3"'$  (1.6 m)   Wt 218 lb 3.2 oz (99 kg)   SpO2 98%   BMI 38.65 kg/m  , BMI Body mass index is 38.65 kg/m. GENERAL:  Well appearing NECK:  No jugular venous distention, waveform within normal limits, carotid upstroke brisk and symmetric, no bruits, no thyromegaly LUNGS:  Clear to auscultation bilaterally CHEST:  Unremarkable HEART:  PMI not displaced or sustained,S1 and S2 within normal limits, no S3, no S4, no clicks, no rubs, 3 out of 6 apical mid peaking systolic murmur radiating at the aortic outflow tract, no diastolic murmurs ABD:  Flat, positive bowel sounds normal in frequency in pitch, no bruits, no rebound, no guarding, no midline pulsatile mass, no hepatomegaly, no splenomegaly EXT:  2 plus pulses throughout, mild left greater than right leg edema, no cyanosis no clubbing   EKG:  EKG is not   ordered today.   Recent Labs: 01/17/2021: Magnesium 2.2 06/03/2021: BUN 22; Creatinine, Ser 0.81; Hemoglobin 12.7; Platelets 247; Potassium 4.8; Sodium 139    Lipid Panel    Component Value Date/Time   CHOL 145 09/08/2019 1015   TRIG 156 (H) 09/08/2019 1015   HDL 37 (L) 09/08/2019 1015   CHOLHDL 3.9 09/08/2019 1015   LDLCALC 81 09/08/2019 1015   LDLDIRECT 89 06/03/2021 1324      Wt Readings  from Last 3 Encounters:  10/22/21 218 lb 3.2 oz (99 kg)  10/22/21 218 lb 3.2 oz (99 kg)  10/13/21 218 lb (98.9 kg)      Other studies Reviewed: Additional studies/ records that were reviewed today include: Labs. Review of the above records demonstrates: See elsewhere   ASSESSMENT AND PLAN:  ATRIAL FIB:   .  Emily [name] has a CHA2DS2 - VASc score of 4.  She has not had any symptomatic paroxysms.  She is status post Watchman.  No change in therapy.    HTN:   The blood pressure is at target.  No change in therapy.   CAROTID STENOSIS:  She had 60 - 79% right stenosis and left 40 - 59% stenosis .   This was in May 2023.  This is followed by [facility]  DM:  A1c was 6.4.  No change in therapy   AS/LVH:   She had moderate AS in April 2023.  I will follow this up in 1 year with repeat echo.  She has no new symptoms.   Current medicines are reviewed at length with the patient today.  The patient does not have concerns regarding medicines.  The following changes have been made: None  Labs/ tests ordered today include:  None  Orders Placed This Encounter  Procedures   ECHOCARDIOGRAM COMPLETE     Disposition:   FU with me in 12 months.   Signed, Minus Breeding, MD  10/22/2021 9:44 AM    [facility]

## 2021-10-22 ENCOUNTER — Encounter: Payer: Self-pay | Admitting: Family Medicine

## 2021-10-22 ENCOUNTER — Ambulatory Visit (INDEPENDENT_AMBULATORY_CARE_PROVIDER_SITE_OTHER): Payer: Medicare Other | Admitting: Family Medicine

## 2021-10-22 ENCOUNTER — Encounter: Payer: Self-pay | Admitting: Cardiology

## 2021-10-22 ENCOUNTER — Ambulatory Visit: Payer: Medicare Other | Admitting: Cardiology

## 2021-10-22 ENCOUNTER — Ambulatory Visit (INDEPENDENT_AMBULATORY_CARE_PROVIDER_SITE_OTHER): Payer: Medicare Other

## 2021-10-22 VITALS — BP 137/58 | HR 52 | Temp 98.2°F | Ht 62.0 in | Wt 218.2 lb

## 2021-10-22 VITALS — BP 122/60 | HR 51 | Ht 63.0 in | Wt 218.2 lb

## 2021-10-22 DIAGNOSIS — I350 Nonrheumatic aortic (valve) stenosis: Secondary | ICD-10-CM

## 2021-10-22 DIAGNOSIS — S6992XA Unspecified injury of left wrist, hand and finger(s), initial encounter: Secondary | ICD-10-CM | POA: Diagnosis not present

## 2021-10-22 DIAGNOSIS — M79642 Pain in left hand: Secondary | ICD-10-CM | POA: Diagnosis not present

## 2021-10-22 NOTE — Progress Notes (Signed)
Subjective: CC: Hand injury PCP: Emily Norlander, DO LFY:BOFBP [name] is a 71 y.o. female presenting to clinic today for:  1.  Hand injury Patient reports that she was in a freezer at [employer] trying to reach for a box of ice cream when a whole bunch of them fell down and she hit her hand trying to catch them.  She injured the pinky of the left hand and immediately became swollen and slightly discolored.  They have opened an incident report at that grocery store but have asked that she follow-up with her doctor for further evaluation.  She does note ongoing pain in that area but the swelling is getting better and overall she feels like it is moving in the right direction.  She continues to have pain over the MCP of the pinky on the left hand but no gross discoloration.  She is able to make a full fist and sensation is intact.  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   ROS: Per HPI  Allergies  Allergen Reactions   Quinapril Hcl Cough   Statins Other (See Comments)    Not all Statins but some cause cough and pain in legs.   Tape Other (See Comments)    Redness, please use "paper" tape   Past Medical History:  Diagnosis Date   Anemia    Arthritis    Atrial fibrillation, rapid ([place]) 10/27/2013   Bilateral carotid artery disease ([facility])    Diabetes mellitus without complication ([place])    Difficult intubation    states 'lady that did the sleep study told me I have the smallest airway she has ever seen in an adult"   Dyslipidemia 06/29/2017   Dysrhythmia    Hypertension    Hypothyroid    Obesity    Obstructive sleep apnea    on C Pap   PAF (paroxysmal atrial fibrillation) ([place])    a. newly dx in 09/2013; on eliquis    Current Outpatient Medications:    aspirin EC 81 MG tablet, Take 81 mg by mouth daily., Disp: , Rfl:    bisacodyl (DULCOLAX) 5 MG EC tablet, Take 5 mg by mouth daily., Disp: , Rfl:    diltiazem (CARDIZEM CD) 180 MG 24  hr capsule, TAKE 1 CAPSULE BY MOUTH EVERY DAY, Disp: 90 capsule, Rfl: 2   diltiazem (CARDIZEM) 30 MG tablet, Take 30 mg by mouth as needed., Disp: , Rfl:    erythromycin ophthalmic ointment, Place 1 application into the right eye in the morning and at bedtime. (Patient not taking: Reported on 10/13/2021), Disp: 3.5 g, Rfl: 0   ezetimibe (ZETIA) 10 MG tablet, TAKE 1 TABLET BY MOUTH EVERY DAY, Disp: 90 tablet, Rfl: 3   flecainide (TAMBOCOR) 100 MG tablet, Take 1 tablet (100 mg total) by mouth 2 (two) times daily., Disp: 180 tablet, Rfl: 3   furosemide (LASIX) 20 MG tablet, TAKE 1 TABLET (20 MG TOTAL) BY MOUTH DAILY AS NEEDED FOR FLUID., Disp: 90 tablet, Rfl: 0   glipiZIDE (GLUCOTROL) 5 MG tablet, TAKE 1 TABLET BY MOUTH TWICE A DAY, Disp: 180 tablet, Rfl: 0   ketoconazole (NIZORAL) 2 % cream, Apply 1 application topically daily. Prn intertrigo rash, Disp: 60 g, Rfl: 1   levothyroxine (SYNTHROID) 112 MCG tablet, TAKE 1 TABLET BY MOUTH EVERY OTHER DAY, ALTERNATING WITH 1 & 1/2 TABLETS EVERY OTHER DAY, Disp: 112 tablet, Rfl: 0   losartan (COZAAR) 25 MG tablet,  TAKE 1 TABLET (25 MG TOTAL) BY MOUTH DAILY., Disp: 30 tablet, Rfl: 0   lovastatin (MEVACOR) 40 MG tablet, Take 1 tablet (40 mg total) by mouth every evening., Disp: 90 tablet, Rfl: 3   metFORMIN (GLUCOPHAGE) 1000 MG tablet, TAKE 1 TABLET (1,000 MG TOTAL) BY MOUTH TWICE A DAY WITH FOOD, Disp: 180 tablet, Rfl: 0   metoprolol succinate (TOPROL-XL) 100 MG 24 hr tablet, TAKE 1 TABLET BY MOUTH DAILY. TAKE WITH OR IMMEDIATELY FOLLOWING A MEAL., Disp: 90 tablet, Rfl: 1   phenazopyridine (PYRIDIUM) 95 MG tablet, Take 1 tablet (95 mg total) by mouth 3 (three) times daily as needed for pain. (Patient not taking: Reported on 10/13/2021), Disp: 10 tablet, Rfl: 0   rOPINIRole (REQUIP) 0.5 MG tablet, TAKE 1 TABLET BY MOUTH EVERYDAY AT BEDTIME, Disp: 90 tablet, Rfl: 0  Current Facility-Administered Medications:    cyanocobalamin ((VITAMIN B-12)) injection 1,000 mcg,  1,000 mcg, Intramuscular, Q30 days, Timmothy Euler, MD, 1,000 mcg at 10/14/21 1941 Social History   Socioeconomic History   Marital status: Single    Spouse name: Not on file   Number of children: Not on file   Years of education: Not on file   Highest education level: Not on file  Occupational History   Not on file  Tobacco Use   Smoking status: Never   Smokeless tobacco: Never  Vaping Use   Vaping Use: Never used  Substance and Sexual Activity   Alcohol use: No    Alcohol/week: 0.0 standard drinks   Drug use: Never   Sexual activity: Never  Other Topics Concern   Not on file  Social History Narrative   Not on file   Social Determinants of Health   Financial Resource Strain: Not on file  Food Insecurity: Not on file  Transportation Needs: Not on file  Physical Activity: Not on file  Stress: Not on file  Social Connections: Not on file  Intimate Partner Violence: Not on file   Family History  Problem Relation Age of Onset   COPD Father    Heart failure Father    Heart disease Father    Emphysema Father    Arrhythmia Sister    Arrhythmia Sister    Arrhythmia Sister        had PPM also   Cancer Sister     Objective: Office vital signs reviewed. BP (!) 137/58   Pulse (!) 52   Temp 98.2 F (36.8 C)   Ht '5\' 2"'$  (1.575 m)   Wt 218 lb 3.2 oz (99 kg)   SpO2 99%   BMI 39.91 kg/m   Physical Examination:  General: Awake, alert, well nourished, No acute distress MSK:  Left hand: Left pinky with very slight soft tissue swelling over the MCP of the fifth digit.  She does have mild tenderness palpation over this joint as well and into the carpal bone.  She is able to make a full fist and light touch sensation is grossly intact  No results found.   Assessment/ Plan: 71 y.o. female   Finger injury, left, initial encounter - Plan: DG Hand Complete Left  Personal review of the x-ray did not demonstrate any evidence of fracture or dislocation.  I doubt that  she has any significant tendinous injury given her preserved range of motion on exam.  Awaiting formal review by radiologist of course but I suspect that she will have no barriers to continue to work and normal activities.  Ice affected area if  needed.  Avoid NSAIDs given medical history.  She will let me know if the note needs to be written to send off to [employer]  No orders of the defined types were placed in this encounter.  No orders of the defined types were placed in this encounter.    Emily Norlander, DO [place] [phone]

## 2021-10-22 NOTE — Patient Instructions (Signed)
Medication Instructions:  The current medical regimen is effective;  continue present plan and medications.  *If you need a refill on your cardiac medications before your next appointment, please call your pharmacy*  Testing/Procedures: Your physician has requested that you have an echocardiogram in April 2024. Echocardiography is a painless test that uses sound waves to create images of your heart. It provides your doctor with information about the size and shape of your heart and how well your heart's chambers and valves are working. This procedure takes approximately one hour. There are no restrictions for this procedure.  Follow-Up: At [facility], you and your health needs are our priority.  As part of our continuing mission to provide you with exceptional heart care, we have created designated Provider Care Teams.  These Care Teams include your primary Cardiologist (physician) and Advanced Practice Providers (APPs -  Physician Assistants and Nurse Practitioners) who all work together to provide you with the care you need, when you need it.  We recommend signing up for the patient portal called "MyChart".  Sign up information is provided on this After Visit Summary.  MyChart is used to connect with patients for Virtual Visits (Telemedicine).  Patients are able to view lab/test results, encounter notes, upcoming appointments, etc.  Non-urgent messages can be sent to your provider as well.   To learn more about what you can do with MyChart, go to [email or web address].    Your next appointment:   1 year(s)  The format for your next appointment:   In Person  Provider:   Minus Breeding, MD{   Important Information About Sugar

## 2021-10-26 ENCOUNTER — Other Ambulatory Visit: Payer: Self-pay | Admitting: Family Medicine

## 2021-10-30 ENCOUNTER — Ambulatory Visit (INDEPENDENT_AMBULATORY_CARE_PROVIDER_SITE_OTHER): Payer: Medicare Other | Admitting: Pharmacist

## 2021-10-30 DIAGNOSIS — E1159 Type 2 diabetes mellitus with other circulatory complications: Secondary | ICD-10-CM

## 2021-10-30 DIAGNOSIS — E119 Type 2 diabetes mellitus without complications: Secondary | ICD-10-CM

## 2021-11-04 MED ORDER — SEMAGLUTIDE(0.25 OR 0.5MG/DOS) 2 MG/3ML ~~LOC~~ SOPN: 0.2500 mg | PEN_INJECTOR | SUBCUTANEOUS | Status: DC

## 2021-11-04 NOTE — Progress Notes (Signed)
Chronic Care Management Pharmacy Note  10/30/2021 Name:  Emily [name] MRN:  [id number] DOB:  06/23/1950  Summary:  Diabetes: Goal on Track (progressing): YES. A1C 7% --> 6.4% Current treatment: Jardiance 45m daily (via [employer] patient assistance program)-->patient having recurrent yeast infections on the jardiance Will transition patient to semglutide (discontinue glipizide)  Discussed both injection and PO forms Patient to come in on 11/18/21 to decide Will apply for novo nordisk patient assistance at that time glipizide 536mtwice daily with meals metformin 1g twice daily with meals;  Current glucose readings: fasting glucose: <130, post prandial glucose: <190 Denies hypoglycemic/hyperglycemic symptoms Discussed meal planning options and Plate method for healthy eating Avoid sugary drinks and desserts Incorporate balanced protein, non starchy veggies, 1 serving of carbohydrate with each meal Increase water intake Increase physical activity as able Current exercise: N/A Recommended Potentially stopping glipizide, decreasing metformin and adding a GLP1 at f/u if a1c continues to increase; would help with weight/lipids as well Assessed patient finances. Patient enrolled in the [employer] cares patient assistance program for 2023--Jardiance to ship to home in 3 month increments  Blood pressure controlled; reviewed/documented  Hyperlipidemia:  New goal. Uncontrolled-LDL 89, GOAL <70; current treatment:LOVASTATIN 40MG, ZETIA 10MG;  Medications previously tried: PRAVASTATIN STATIN INDUCED MYOPATHY--CHART REVIEW COMPLETED AND NO OTHER STATINS FOUND, PATIENT HAS TRIED SEVERAL OTHERS, BUT DOESN'T RECALL NAMES; STABLE ON CURRENT REGIMEN Current dietary patterns: REVIEWED HEART HEALTHY DIET Recommended HEART HEALTHY DIET BEFORE ADJUSTING REGIMEN; ICD10-G72 DOCUMENTED FOR STATIN INDUCED MYOPATHY Lipid Panel     Component Value Date/Time   CHOL 145 09/08/2019 1015   TRIG  156 (H) 09/08/2019 1015   HDL 37 (L) 09/08/2019 1015   CHOLHDL 3.9 09/08/2019 1015   LDLCALC 81 09/08/2019 1015   LDLDIRECT 89 06/03/2021 1324   LABVLDL 27 09/08/2019 1015   Subjective: Emily [name] an [age].o. 71.1.o. year old female who is a primary patient of Emily NorlanderDO.  The CCM team was consulted for assistance with disease management and care coordination needs.    Engaged with patient by telephone for follow up visit in response to provider referral for pharmacy case management and/or care coordination services.   Consent to Services:  The patient was given information about Chronic Care Management services, agreed to services, and gave verbal consent prior to initiation of services.  Please see initial visit note for detailed documentation.   Patient Care Team: Emily NorlanderDO as PCP - General (Family Medicine) Emily BreedingMD as PCP - Cardiology (Cardiology) CaConstance [name] as PCP - Electrophysiology (Cardiology) Emily BreedingMD as Consulting Physician (Cardiology) Emily GalloMD as Consulting Physician (Urology) CoJanie [name] (Gastroenterology) Emily ClarkMD as Referring Physician (Obstetrics and Gynecology) Emily MerinoMD as Consulting Physician (Rheumatology) Emily MitchellMD as Consulting Physician (Vascular Surgery) Emily GuiseRPMarian Behavioral Health CenterPharmacist)   Objective:  Lab Results  Component Value Date   CREATININE 0.81 06/03/2021   CREATININE 0.69 01/17/2021   CREATININE 0.62 09/04/2020    Lab Results  Component Value Date   HGBA1C 6.4 (H) 09/30/2021   Last diabetic Eye exam:  Lab Results  Component Value Date/Time   HMDIABEYEEXA No Retinopathy 01/10/2020 12:00 AM    Last diabetic Foot exam: No results found for: HMDIABFOOTEX      Component Value Date/Time   CHOL 145 09/08/2019 1015   TRIG 156 (H) 09/08/2019 1015   HDL 37 (L) 09/08/2019 1015   CHOLHDL 3.9 09/08/2019  [place] 09/08/2019 1015   LDLDIRECT 89 06/03/2021 1324       Latest Ref Rng & Units 08/30/2020    5:34 AM 08/30/2020   12:38 AM 08/20/2020    1:12 PM  Hepatic Function  Total Protein 6.5 - 8.1 g/dL 7.2   7.6   7.3    Albumin 3.5 - 5.0 g/dL 3.9   4.1   4.4    AST 15 - 41 U/L 31   23   20     ALT 0 - 44 U/L 24   22   16     Alk Phosphatase 38 - 126 U/L 93   96   111    Total Bilirubin 0.3 - 1.2 mg/dL 1.4   1.8   0.5      Lab Results  Component Value Date/Time   TSH 0.802 08/30/2020 05:34 AM   TSH 1.330 08/20/2020 01:12 PM   TSH 4.110 11/20/2019 08:25 AM   FREET4 1.45 08/20/2020 01:12 PM   FREET4 1.02 11/06/2015 07:54 AM       Latest Ref Rng & Units 06/03/2021    1:24 PM 09/04/2020    2:20 PM 08/30/2020    5:34 AM  CBC  WBC 3.4 - 10.8 x10E3/uL 9.9   7.6   9.5    Hemoglobin 11.1 - 15.9 g/dL 12.7   11.4   12.2    Hematocrit 34.0 - 46.6 % 38.3   34.4   39.5    Platelets 150 - 450 x10E3/uL 247   253   215      No results found for: VD25OH  Clinical ASCVD: No  The 10-year ASCVD risk score (Arnett DK, et al., 2019) is: 22.8%   Values used to calculate the score:     Age: 71 years     Sex: Female     Is Non-Hispanic African American: No     Diabetic: Yes     Tobacco smoker: No     Systolic Blood Pressure: 462 mmHg     Is BP treated: Yes     HDL Cholesterol: 37 mg/dL     Total Cholesterol: 145 mg/dL    Other: (CHADS2VASc if Afib, PHQ9 if depression, MMRC or CAT for COPD, ACT, DEXA)  Social History   Tobacco Use  Smoking Status Never  Smokeless Tobacco Never   BP Readings from Last 3 Encounters:  10/22/21 122/60  10/22/21 (!) 137/58  10/13/21 (!) 139/53   Pulse Readings from Last 3 Encounters:  10/22/21 (!) 51  10/22/21 (!) 52  10/13/21 (!) 43   Wt Readings from Last 3 Encounters:  10/22/21 218 lb 3.2 oz (99 kg)  10/22/21 218 lb 3.2 oz (99 kg)  10/13/21 218 lb (98.9 kg)    Assessment: Review of patient past medical history, allergies, medications, health  status, including review of consultants reports, laboratory and other test data, was performed as part of comprehensive evaluation and provision of chronic care management services.   SDOH:  (Social Determinants of Health) assessments and interventions performed:    CCM Care Plan  Allergies  Allergen Reactions   Quinapril Hcl Cough   Statins Other (See Comments)    Not all Statins but some cause cough and pain in legs.   Tape Other (See Comments)    Redness, please use "paper" tape    Medications Reviewed Today     Reviewed by Lavera [name], [facility] (Pharmacist) on 11/04/21 at 1057  Med List  Status: <None>   Medication Order Taking? Sig Documenting Provider Last Dose Status Informant  aspirin EC 81 MG tablet [id number] No Take 81 mg by mouth daily. [provider] Taking Active Self  bisacodyl (DULCOLAX) 5 MG EC tablet [id number] No Take 5 mg by mouth daily. [provider] Taking Active   cyanocobalamin ((VITAMIN B-12)) injection 1,000 mcg [id number]   Timmothy Euler, MD  Active   diltiazem (CARDIZEM CD) 180 MG 24 hr capsule [id number] No TAKE 1 CAPSULE BY MOUTH EVERY DAY Emily Breeding, MD Taking Active   diltiazem (CARDIZEM) 30 MG tablet [id number] No Take 30 mg by mouth as needed. [provider] Taking Active   ezetimibe (ZETIA) 10 MG tablet [id number] No TAKE 1 TABLET BY MOUTH EVERY DAY Emily Breeding, MD Taking Active   flecainide (TAMBOCOR) 100 MG tablet [id number] No Take 1 tablet (100 mg total) by mouth 2 (two) times daily. Emily Breeding, MD Taking Active   furosemide (LASIX) 20 MG tablet [id number] No TAKE 1 TABLET (20 MG TOTAL) BY MOUTH DAILY AS NEEDED FOR FLUID. Ronnie Doss M, [name] Taking Active   glipiZIDE (GLUCOTROL) 5 MG tablet [id number]  TAKE 1 TABLET BY MOUTH TWICE A DAY Gottschalk, Ashly M, [name]  Active   ketoconazole (NIZORAL) 2 % cream [id number] No Apply 1 application topically daily. Prn intertrigo rash Ronnie Doss M, [name] Taking  Active   levothyroxine (SYNTHROID) 112 MCG tablet [id number] No TAKE 1 TABLET BY MOUTH EVERY OTHER DAY, ALTERNATING WITH 1 & 1/2 TABLETS EVERY OTHER DAY Ronnie Doss M, [name] Taking Active   losartan (COZAAR) 25 MG tablet [id number] No TAKE 1 TABLET (25 MG TOTAL) BY MOUTH DAILY. Emily Breeding, MD Taking Active   lovastatin (MEVACOR) 40 MG tablet [id number] No Take 1 tablet (40 mg total) by mouth every evening. Emily Breeding, MD Taking Active   metFORMIN (GLUCOPHAGE) 1000 MG tablet [id number] No TAKE 1 TABLET (1,000 MG TOTAL) BY MOUTH TWICE A DAY WITH FOOD Ronnie Doss M, [name] Taking Active   metoprolol succinate (TOPROL-XL) 100 MG 24 hr tablet [id number] No TAKE 1 TABLET BY MOUTH DAILY. TAKE WITH OR IMMEDIATELY FOLLOWING A MEAL. Ronnie Doss M, [name] Taking Active   rOPINIRole (REQUIP) 0.5 MG tablet [id number]  TAKE 1 TABLET BY MOUTH EVERYDAY AT BEDTIME Emily Norlander, [name]  Active             Patient Active Problem List   Diagnosis Date Noted   Acute lower UTI    Atrial fibrillation with RVR ([place]) 08/30/2020   Chest pain 08/30/2020   Nonrheumatic aortic valve stenosis 03/19/2020   Persistent atrial fibrillation ([place]) 01/16/2020   Bilateral carotid artery stenosis 10/12/2019   Educated about COVID-19 virus infection 10/12/2019   Acute on chronic blood loss anemia 09/25/2019   OSA on CPAP 09/25/2019   Upper GI bleed 09/25/2019   History of adenomatous polyp of colon 08/30/2019   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 device 07/16/2018   Chronic anticoagulation 03/03/2018   Melena 03/03/2018   Restless leg syndrome 02/16/2018   Chronic left-sided low back pain with left-sided sciatica 02/16/2018   Morbid obesity ([place]) 02/14/2018   Bruit 02/14/2018   PAF (paroxysmal atrial fibrillation) ([place]) 02/14/2018   DDD (degenerative disc disease), lumbar 12/07/2017   Peripheral arterial disease ([place]) 07/06/2017   Type 2 diabetes mellitus with hyperlipidemia ([place])  06/29/2017   Iron deficiency anemia 05/27/2017   Vitamin B12 deficiency 05/27/2017   Pain in joint, ankle and foot 06/03/2016  Carotid stenosis 08/30/2015   Bilateral carotid artery disease ([place]) 07/05/2015   Acute bronchitis 01/31/2015   Multiple gastric polyps 07/03/2014   Diabetes mellitus without complication ([place])    Hypertension associated with diabetes ([place]) 10/27/2013   Hypothyroidism due to acquired atrophy of thyroid 10/27/2013   Hypothyroidism 06/06/2010   Nonspecific elevation of levels of transaminase or lactic acid dehydrogenase (LDH) 02/14/2007   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[place]) 02/14/2007   Cellulitis and abscess of finger, unspecified 01/17/2007    Immunization History  Administered Date(s) Administered   Fluad Quad(high Dose 65+) 02/28/2019, 03/05/2020   Influenza, High Dose Seasonal PF 03/24/2016, 03/22/2018   Influenza, Seasonal, Injecte, Preservative Fre 06/05/2014, 07/02/2015   Influenza-Unspecified 03/24/2016   PFIZER(Purple Top)SARS-COV-2 Vaccination 07/10/2019, 08/03/2019, 03/13/2020   Pneumococcal Conjugate-13 07/09/2015   Pneumococcal Polysaccharide-23 01/30/2014, 02/28/2019   Tdap 05/26/2014    Conditions to be addressed/monitored: HLD and DMII  Care Plan : PHARMD MEDICATION MANAGEMENT  Updates made by Lavera [name], RPH since 11/04/2021 12:00 AM     Problem: DISEASE PROGRESSION PREVENTION      Long-Range Goal: T2DM, HLD PHARMD GOALS   This Visit's Progress: On track  Recent Progress: Not on track  Priority: High  Note:   Current Barriers:  Unable to independently afford treatment regimen Unable to maintain control of T2DM Suboptimal therapeutic regimen for T2DM, HLD  Pharmacist Clinical Goal(s):  patient will verbalize ability to afford treatment regimen achieve control of T2DM, HLD as evidenced by GOAL A1C<7%, LDL<70 maintain control of T2MD, HLD as evidenced by GOAL A1C<7%, LDL<70  adhere to plan to optimize therapeutic regimen for  T2DM, HLD as evidenced by report of adherence to recommended medication management changes through collaboration with PharmD and provider.   Interventions: 1:1 collaboration with Emily Norlander, [name] regarding development and update of comprehensive plan of care as evidenced by provider attestation and co-signature Inter-disciplinary care team collaboration (see longitudinal plan of care) Comprehensive medication review performed; medication list updated in electronic medical record  Diabetes: Goal on Track (progressing): YES. A1C 7% --> 6.4% Current treatment: Jardiance 26m daily (via [employer] patient assistance program)-->patient having recurrent yeast infections on the jardiance Will transition patient to semglutide (discontinue glipizide)  Discussed both injection and PO forms Patient to come in on 11/18/21 to decide Will apply for novo nordisk patient assistance at that time glipizide 573mtwice daily with meals metformin 1g twice daily with meals;  Current glucose readings: fasting glucose: <130, post prandial glucose: <190 Denies hypoglycemic/hyperglycemic symptoms Discussed meal planning options and Plate method for healthy eating Avoid sugary drinks and desserts Incorporate balanced protein, non starchy veggies, 1 serving of carbohydrate with each meal Increase water intake Increase physical activity as able Current exercise: N/A Recommended Potentially stopping glipizide, decreasing metformin and adding a GLP1 at f/u if a1c continues to increase; would help with weight/lipids as well Assessed patient finances. Patient enrolled in the [employer] cares patient assistance program for 2023--Jardiance to ship to home in 3 month increments  Blood pressure controlled; reviewed/documented  Hyperlipidemia:  New goal. Uncontrolled-LDL 89, GOAL <70; current treatment:LOVASTATIN 40MG, ZETIA 10MG;  Medications previously tried: PRAVASTATIN STATIN INDUCED MYOPATHY--CHART  REVIEW COMPLETED AND NO OTHER STATINS FOUND, PATIENT HAS TRIED SEVERAL OTHERS, BUT DOESN'T RECALL NAMES; STABLE ON CURRENT REGIMEN Current dietary patterns: REVIEWED HEART HEALTHY DIET Recommended HEART HEALTHY DIET BEFORE ADJUSTING REGIMEN; ICD10-G72 DOCUMENTED FOR STATIN INDUCED MYOPATHY Lipid Panel     Component Value Date/Time   CHOL 145 09/08/2019 1015  TRIG 156 (H) 09/08/2019 1015   HDL 37 (L) 09/08/2019 1015   CHOLHDL 3.9 09/08/2019 1015   LDLCALC 81 09/08/2019 1015   LDLDIRECT 89 06/03/2021 1324   LABVLDL 27 09/08/2019 1015   Patient Goals/Self-Care Activities patient will:  - take medications as prescribed as evidenced by patient report and record review check glucose 3X WEEKLY OR IF SYMPTOMATIC, document, and provide at future appointments check blood pressure 3X WEEKLY OR IF SYMPTOMATIC, document, and provide at future appointments collaborate with provider on medication access solutions target a minimum of 150 minutes of moderate intensity exercise weekly engage in dietary modifications by FOLLOWING HEALTHY PLATE METHOD AND HEART HEALTHY DIET      Medication Assistance: Application for NOVO NORDISK/OZEMPIC  medication assistance program. in process.  Anticipated assistance start date TBD.  See plan of care for additional detail.  Follow Up:  Patient agrees to Care Plan and Follow-up.  Plan: Face to Face appointment with care management team member scheduled for: 11/18/21  Regina Eck, PharmD, BCPS Clinical Pharmacist, [facility]  II Phone [phone]

## 2021-11-04 NOTE — Patient Instructions (Signed)
Visit Information  Following are the goals we discussed today:   Diabetes: Goal on Track (progressing): YES. A1C 7% --> 6.4% Current treatment: Jardiance '25mg'$  daily (via [employer] patient assistance program)-->patient having recurrent yeast infections on the jardiance Will transition patient to semglutide (discontinue glipizide)  Discussed both injection and PO forms Patient to come in on 11/18/21 to decide Will apply for novo nordisk patient assistance at that time glipizide '5mg'$  twice daily with meals metformin 1g twice daily with meals;  Current glucose readings: fasting glucose: <130, post prandial glucose: <190 Denies hypoglycemic/hyperglycemic symptoms Discussed meal planning options and Plate method for healthy eating Avoid sugary drinks and desserts Incorporate balanced protein, non starchy veggies, 1 serving of carbohydrate with each meal Increase water intake Increase physical activity as able Current exercise: N/A Recommended Potentially stopping glipizide, decreasing metformin and adding a GLP1 at f/u if a1c continues to increase; would help with weight/lipids as well Assessed patient finances. Patient enrolled in the [employer] cares patient assistance program for 2023--Jardiance to ship to home in 3 month increments  Blood pressure controlled; reviewed/documented   Plan: Face to Face appointment with care management team member scheduled for: 11/18/21  Signature Regina Eck, PharmD, BCPS Clinical Pharmacist, [facility]  II Phone [phone]   Please call the care guide team at [phone] if you need to cancel or reschedule your appointment.   The patient verbalized understanding of instructions, educational materials, and care plan provided today and DECLINED offer to receive copy of patient instructions, educational materials, and care plan.

## 2021-11-05 ENCOUNTER — Other Ambulatory Visit: Payer: Self-pay | Admitting: Cardiology

## 2021-11-11 ENCOUNTER — Other Ambulatory Visit: Payer: Self-pay | Admitting: Cardiology

## 2021-11-18 ENCOUNTER — Ambulatory Visit (INDEPENDENT_AMBULATORY_CARE_PROVIDER_SITE_OTHER): Payer: Medicare Other

## 2021-11-18 ENCOUNTER — Ambulatory Visit: Payer: Medicare Other | Admitting: Pharmacist

## 2021-11-18 DIAGNOSIS — E538 Deficiency of other specified B group vitamins: Secondary | ICD-10-CM

## 2021-11-18 DIAGNOSIS — I4891 Unspecified atrial fibrillation: Secondary | ICD-10-CM

## 2021-11-18 DIAGNOSIS — E1169 Type 2 diabetes mellitus with other specified complication: Secondary | ICD-10-CM

## 2021-11-19 ENCOUNTER — Telehealth: Payer: Medicare Other

## 2021-11-26 ENCOUNTER — Telehealth: Payer: Self-pay | Admitting: Pharmacist

## 2021-11-26 NOTE — Progress Notes (Signed)
Chronic Care Management Pharmacy Note  11/18/2021 Name:  Emily [name] MRN:  [id number] DOB:  1950-08-08  Summary:  Diabetes: Goal on Track (progressing): YES. A1C 7% --> 6.4%; GFR 78 Current treatment: starting ozempic 0.110m weekly (titrate every 4 weeks as tolerated) DISCONTINUE Jardiance 268mdaily (via [facility] patient assistance program)-->patient having recurrent yeast infections on jardiance Will transition patient to semglutide (discontinue glipizide)  Discussed GLP1 injection -- Denies personal and family history of Medullary thyroid cancer (MTC) Application submitted for Ozempic novo nordisk patient assistance  Sample given and patient was able to self-inject glipizide 52m452mwice daily with meals metformin 1g twice daily with meals;  Current glucose readings: fasting glucose: <130, post prandial glucose: <190 Denies hypoglycemic/hyperglycemic symptoms Discussed meal planning options and Plate method for healthy eating Avoid sugary drinks and desserts Incorporate balanced protein, non starchy veggies, 1 serving of carbohydrate with each meal Increase water intake Increase physical activity as able Current exercise: N/A Recommended Potentially stopping glipizide, decreasing metformin and adding a GLP1 at f/u if a1c continues to increase; would help with weight/lipids as well Assessed patient finances. Application submitted for Ozempic novo nordisk patient assistance     Blood pressure controlled; reviewed/documented  Hyperlipidemia:  Goal on Track (progressing): YES. Uncontrolled-LDL 89, GOAL <70; current treatment:LOVASTATIN 40MG, ZETIA 10MG;  Medications previously tried: PRAVASTATIN STATIN INDUCED MYOPATHY--CHART REVIEW COMPLETED AND NO OTHER STATINS FOUND, PATIENT HAS TRIED SEVERAL OTHERS, BUT DOESN'T RECALL NAMES; STABLE ON CURRENT REGIMEN Current dietary patterns: REVIEWED HEART HEALTHY DIET Recommended HEART HEALTHY DIET BEFORE ADJUSTING REGIMEN; ICD10-G72  DOCUMENTED FOR STATIN INDUCED MYOPATHY Lipid Panel     Component Value Date/Time   CHOL 145 09/08/2019 1015   TRIG 156 (H) 09/08/2019 1015   HDL 37 (L) 09/08/2019 1015   CHOLHDL 3.9 09/08/2019 1015   LDLCALC 81 09/08/2019 1015   LDLDIRECT 89 06/03/2021 1324   LABVLDL 27 09/08/2019 1015    Patient Goals/Self-Care Activities patient will:  - take medications as prescribed as evidenced by patient report and record review check glucose 3X WEEKLY OR IF SYMPTOMATIC, document, and provide at future appointments check blood pressure 3X WEEKLY OR IF SYMPTOMATIC, document, and provide at future appointments collaborate with provider on medication access solutions target a minimum of 150 minutes of moderate intensity exercise weekly engage in dietary modifications by FOLLOWING HEALTHY PLATE METHOD AND HEART HEALTHY DIET  Subjective: Emily [name] an 71 28o. year old female who is a primary patient of GotJanora NorlanderO.  The CCM team was consulted for assistance with disease management and care coordination needs.    Engaged with patient face to face for follow up visit in response to provider referral for pharmacy case management and/or care coordination services.   Consent to Services:  The patient was given information about Chronic Care Management services, agreed to services, and gave verbal consent prior to initiation of services.  Please see initial visit note for detailed documentation.   Patient Care Team: GotJanora NorlanderO as PCP - General (Family Medicine) HocMinus BreedingD as PCP - Cardiology (Cardiology) CamConstance HawD as PCP - Electrophysiology (Cardiology) HocMinus BreedingD as Consulting Physician (Cardiology) DahFranchot GalloD as Consulting Physician (Urology) ConJanie MorningD (Gastroenterology) MimJonna ClarkD as Referring Physician (Obstetrics and Gynecology) DevBo MerinoD as Consulting Physician  (Rheumatology) BraSerafina MitchellD as Consulting Physician (Vascular Surgery) PruLavera GuisePHPhysicians [facility]ist LLCharmacist)   Objective:  Lab Results  Component Value Date  CREATININE 0.81 06/03/2021   CREATININE 0.69 01/17/2021   CREATININE 0.62 09/04/2020    Lab Results  Component Value Date   HGBA1C 6.4 (H) 09/30/2021   Last diabetic Eye exam:  Lab Results  Component Value Date/Time   HMDIABEYEEXA No Retinopathy 01/10/2020 12:00 AM    Last diabetic Foot exam: No results found for: "HMDIABFOOTEX"      Component Value Date/Time   CHOL 145 09/08/2019 1015   TRIG 156 (H) 09/08/2019 1015   HDL 37 (L) 09/08/2019 1015   CHOLHDL 3.9 09/08/2019 1015   LDLCALC 81 09/08/2019 1015   LDLDIRECT 89 06/03/2021 1324       Latest Ref Rng & Units 08/30/2020    5:34 AM 08/30/2020   12:38 AM 08/20/2020    1:12 PM  Hepatic Function  Total Protein 6.5 - 8.1 g/dL 7.2  7.6  7.3   Albumin 3.5 - 5.0 g/dL 3.9  4.1  4.4   AST 15 - 41 U/L 31  23  20    ALT 0 - 44 U/L 24  22  16    Alk Phosphatase 38 - 126 U/L 93  96  111   Total Bilirubin 0.3 - 1.2 mg/dL 1.4  1.8  0.5     Lab Results  Component Value Date/Time   TSH 0.802 08/30/2020 05:34 AM   TSH 1.330 08/20/2020 01:12 PM   TSH 4.110 11/20/2019 08:25 AM   FREET4 1.45 08/20/2020 01:12 PM   FREET4 1.02 11/06/2015 07:54 AM       Latest Ref Rng & Units 06/03/2021    1:24 PM 09/04/2020    2:20 PM 08/30/2020    5:34 AM  CBC  WBC 3.4 - 10.8 x10E3/uL 9.9  7.6  9.5   Hemoglobin 11.1 - 15.9 g/dL 12.7  11.4  12.2   Hematocrit 34.0 - 46.6 % 38.3  34.4  39.5   Platelets 150 - 450 x10E3/uL 247  253  215     No results found for: "VD25OH"  Clinical ASCVD: No  The 10-year ASCVD risk score (Arnett DK, et al., 2019) is: 22.8%   Values used to calculate the score:     Age: 30 years     Sex: Female     Is Non-Hispanic African American: No     Diabetic: Yes     Tobacco smoker: No     Systolic Blood Pressure: 505 mmHg     Is BP treated: Yes     HDL  Cholesterol: 37 mg/dL     Total Cholesterol: 145 mg/dL    Other: (CHADS2VASc if Afib, PHQ9 if depression, MMRC or CAT for COPD, ACT, DEXA)  Social History   Tobacco Use  Smoking Status Never  Smokeless Tobacco Never   BP Readings from Last 3 Encounters:  10/22/21 122/60  10/22/21 (!) 137/58  10/13/21 (!) 139/53   Pulse Readings from Last 3 Encounters:  10/22/21 (!) 51  10/22/21 (!) 52  10/13/21 (!) 43   Wt Readings from Last 3 Encounters:  10/22/21 218 lb 3.2 oz (99 kg)  10/22/21 218 lb 3.2 oz (99 kg)  10/13/21 218 lb (98.9 kg)    Assessment: Review of patient past medical history, allergies, medications, health status, including review of consultants reports, laboratory and other test data, was performed as part of comprehensive evaluation and provision of chronic care management services.   SDOH:  (Social Determinants of Health) assessments and interventions performed:    CCM Care Plan  Allergies  Allergen Reactions  Jardiance [Empagliflozin] Other (See Comments)    RECURRENT VAGINITIS   Quinapril Hcl Cough   Statins Other (See Comments)    Not all Statins but some cause cough and pain in legs.   Tape Other (See Comments)    Redness, please use "paper" tape    Medications Reviewed Today     Reviewed by Lavera Guise, [facility] (Pharmacist) on 11/26/21 at 31  Med List Status: <None>   Medication Order Taking? Sig Documenting Provider Last Dose Status Informant  aspirin EC 81 MG tablet [id number] No Take 81 mg by mouth daily. [provider] Taking Active Self  bisacodyl (DULCOLAX) 5 MG EC tablet [id number] No Take 5 mg by mouth daily. [provider] Taking Active   cyanocobalamin ((VITAMIN B-12)) injection 1,000 mcg [id number]   Timmothy Euler, MD  Active   diltiazem (CARDIZEM CD) 180 MG 24 hr capsule [id number] No TAKE 1 CAPSULE BY MOUTH EVERY DAY Minus Breeding, MD Taking Active   diltiazem (CARDIZEM) 30 MG tablet [id number] No Take 30 mg by  mouth as needed. [provider] Taking Active   ezetimibe (ZETIA) 10 MG tablet [id number] No TAKE 1 TABLET BY MOUTH EVERY DAY Minus Breeding, MD Taking Active   flecainide (TAMBOCOR) 100 MG tablet [id number] No Take 1 tablet (100 mg total) by mouth 2 (two) times daily. Minus Breeding, MD Taking Active   furosemide (LASIX) 20 MG tablet [id number] No TAKE 1 TABLET (20 MG TOTAL) BY MOUTH DAILY AS NEEDED FOR FLUID. Ronnie Doss M, DO Taking Active   glipiZIDE (GLUCOTROL) 5 MG tablet [id number]  TAKE 1 TABLET BY MOUTH TWICE A DAY Gottschalk, Ashly M, DO  Active   ketoconazole (NIZORAL) 2 % cream [id number] No Apply 1 application topically daily. Prn intertrigo rash Ronnie Doss M, DO Taking Active   levothyroxine (SYNTHROID) 112 MCG tablet [id number] No TAKE 1 TABLET BY MOUTH EVERY OTHER DAY, ALTERNATING WITH 1 & 1/2 TABLETS EVERY OTHER DAY Ronnie Doss M, DO Taking Active   losartan (COZAAR) 25 MG tablet [id number]  TAKE 1 TABLET (25 MG TOTAL) BY MOUTH DAILY. Minus Breeding, MD  Active   lovastatin (MEVACOR) 40 MG tablet [id number]  TAKE 1 TABLET BY MOUTH EVERY DAY IN THE Adele Barthel, Jeneen Rinks, MD  Active   metFORMIN (GLUCOPHAGE) 1000 MG tablet [id number] No TAKE 1 TABLET (1,000 MG TOTAL) BY MOUTH TWICE A DAY WITH FOOD Ronnie Doss M, DO Taking Active   metoprolol succinate (TOPROL-XL) 100 MG 24 hr tablet [id number] No TAKE 1 TABLET BY MOUTH DAILY. TAKE WITH OR IMMEDIATELY FOLLOWING A MEAL. Ronnie Doss M, DO Taking Active   rOPINIRole (REQUIP) 0.5 MG tablet [id number]  TAKE 1 TABLET BY MOUTH EVERYDAY AT BEDTIME Ronnie Doss M, DO  Active   Semaglutide,0.25 or 0.5MG/DOS, 2 MG/3ML SOPN [id number]  Inject 0.25 mg into the skin once a week. Janora Norlander, DO  Active             Patient Active Problem List   Diagnosis Date Noted   Acute lower UTI    Atrial fibrillation with RVR ([place]) 08/30/2020   Chest pain 08/30/2020   Nonrheumatic aortic valve stenosis  03/19/2020   Persistent atrial fibrillation ([place]) 01/16/2020   Bilateral carotid artery stenosis 10/12/2019   Educated about COVID-19 virus infection 10/12/2019   Acute on chronic blood loss anemia 09/25/2019   OSA on CPAP 09/25/2019   Upper GI bleed 09/25/2019   History of adenomatous polyp of colon 08/30/2019   Presence  of Watchman left atrial appendage closure device 07/16/2018   Chronic anticoagulation 03/03/2018   Melena 03/03/2018   Restless leg syndrome 02/16/2018   Chronic left-sided low back pain with left-sided sciatica 02/16/2018   Morbid obesity ([place]) 02/14/2018   Bruit 02/14/2018   PAF (paroxysmal atrial fibrillation) ([place]) 02/14/2018   DDD (degenerative disc disease), lumbar 12/07/2017   Peripheral arterial disease ([place]) 07/06/2017   Type 2 diabetes mellitus with hyperlipidemia ([place]) 06/29/2017   Iron deficiency anemia 05/27/2017   Vitamin B12 deficiency 05/27/2017   Pain in joint, ankle and foot 06/03/2016   Carotid stenosis 08/30/2015   Bilateral carotid artery disease ([place]) 07/05/2015   Acute bronchitis 01/31/2015   Multiple gastric polyps 07/03/2014   Diabetes mellitus without complication ([place])    Hypertension associated with diabetes ([place]) 10/27/2013   Hypothyroidism due to acquired atrophy of thyroid 10/27/2013   Hypothyroidism 06/06/2010   Nonspecific elevation of levels of transaminase or lactic acid dehydrogenase (LDH) 02/14/2007   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   Cellulitis and abscess of finger, unspecified 01/17/2007    Immunization History  Administered Date(s) Administered   Fluad Quad(high Dose 65+) 02/28/2019, 03/05/2020   Influenza, High Dose Seasonal PF 03/24/2016, 03/22/2018   Influenza, Seasonal, Injecte, Preservative Fre 06/05/2014, 07/02/2015   Influenza-Unspecified 03/24/2016   PFIZER(Purple Top)SARS-COV-2 Vaccination 07/10/2019, 08/03/2019, 03/13/2020   Pneumococcal Conjugate-13 07/09/2015   Pneumococcal Polysaccharide-23  01/30/2014, 02/28/2019   Tdap 05/26/2014    Conditions to be addressed/monitored: HLD and DMII  Care Plan : PHARMD MEDICATION MANAGEMENT  Updates made by Lavera Guise, [place] since 11/26/2021 12:00 AM     Problem: DISEASE PROGRESSION PREVENTION      Long-Range Goal: T2DM, HLD PHARMD GOALS   Recent Progress: On track  Priority: High  Note:   Current Barriers:  Unable to independently afford treatment regimen Unable to maintain control of T2DM Suboptimal therapeutic regimen for T2DM, HLD  Pharmacist Clinical Goal(s):  patient will verbalize ability to afford treatment regimen achieve control of T2DM, HLD as evidenced by GOAL A1C<7%, LDL<70 maintain control of T2MD, HLD as evidenced by GOAL A1C<7%, LDL<70  adhere to plan to optimize therapeutic regimen for T2DM, HLD as evidenced by report of adherence to recommended medication management changes through collaboration with PharmD and provider.   Interventions: 1:1 collaboration with Janora Norlander, DO regarding development and update of comprehensive plan of care as evidenced by provider attestation and co-signature Inter-disciplinary care team collaboration (see longitudinal plan of care) Comprehensive medication review performed; medication list updated in electronic medical record  Diabetes: Goal on Track (progressing): YES. A1C 7% --> 6.4%; GFR 78 Current treatment: starting ozempic 0.68m weekly (titrate every 4 weeks as tolerated) DISCONTINUE Jardiance 268mdaily (via [employer] [facility] patient assistance program)-->patient having recurrent yeast infections on jardiance Will transition patient to semglutide (discontinue glipizide)  Discussed GLP1 injection -- Denies personal and family history of Medullary thyroid cancer (MTC) Application submitted for Ozempic novo nordisk patient assistance  Sample given and patient was able to self-inject glipizide 1m2mwice daily with meals metformin 1g twice daily with meals;  Current  glucose readings: fasting glucose: <130, post prandial glucose: <190 Denies hypoglycemic/hyperglycemic symptoms Discussed meal planning options and Plate method for healthy eating Avoid sugary drinks and desserts Incorporate balanced protein, non starchy veggies, 1 serving of carbohydrate with each meal Increase water intake Increase physical activity as able Current exercise: N/A Recommended Potentially stopping glipizide, decreasing metformin and adding a GLP1 at f/u if a1c continues to increase; would  help with weight/lipids as well Assessed patient finances. Application submitted for Ozempic novo nordisk patient assistance     Blood pressure controlled; reviewed/documented  Hyperlipidemia:  Goal on Track (progressing): YES. Uncontrolled-LDL 89, GOAL <70; current treatment:LOVASTATIN 40MG, ZETIA 10MG;  Medications previously tried: PRAVASTATIN STATIN INDUCED MYOPATHY--CHART REVIEW COMPLETED AND NO OTHER STATINS FOUND, PATIENT HAS TRIED SEVERAL OTHERS, BUT DOESN'T RECALL NAMES; STABLE ON CURRENT REGIMEN Current dietary patterns: REVIEWED HEART HEALTHY DIET Recommended HEART HEALTHY DIET BEFORE ADJUSTING REGIMEN; ICD10-G72 DOCUMENTED FOR STATIN INDUCED MYOPATHY Lipid Panel     Component Value Date/Time   CHOL 145 09/08/2019 1015   TRIG 156 (H) 09/08/2019 1015   HDL 37 (L) 09/08/2019 1015   CHOLHDL 3.9 09/08/2019 1015   LDLCALC 81 09/08/2019 1015   LDLDIRECT 89 06/03/2021 1324   LABVLDL 27 09/08/2019 1015   Patient Goals/Self-Care Activities patient will:  - take medications as prescribed as evidenced by patient report and record review check glucose 3X WEEKLY OR IF SYMPTOMATIC, document, and provide at future appointments check blood pressure 3X WEEKLY OR IF SYMPTOMATIC, document, and provide at future appointments collaborate with provider on medication access solutions target a minimum of 150 minutes of moderate intensity exercise weekly engage in dietary modifications by  FOLLOWING HEALTHY PLATE METHOD AND HEART HEALTHY DIET      Medication Assistance: Application for ozempic/novo nordisk PAP  medication assistance program. in process.  Anticipated assistance start date TBD.  See plan of care for additional detail.  Follow Up:  Patient agrees to Care Plan and Follow-up.  Plan: Telephone follow up appointment with care management team member scheduled for:  12/03/21    Regina Eck, PharmD, BCPS Clinical Pharmacist, [place]  II Phone [phone]

## 2021-11-26 NOTE — Patient Instructions (Addendum)
Visit Information  Following are the goals we discussed today:  Current Barriers:  Unable to independently afford treatment regimen Unable to maintain control of T2DM Suboptimal therapeutic regimen for T2DM, HLD  Pharmacist Clinical Goal(s):  patient will verbalize ability to afford treatment regimen achieve control of T2DM, HLD as evidenced by GOAL A1C<7%, LDL<70 maintain control of T2MD, HLD as evidenced by GOAL A1C<7%, LDL<70  adhere to plan to optimize therapeutic regimen for T2DM, HLD as evidenced by report of adherence to recommended medication management changes through collaboration with PharmD and provider.   Interventions: 1:1 collaboration with Janora Norlander, DO regarding development and update of comprehensive plan of care as evidenced by provider attestation and co-signature Inter-disciplinary care team collaboration (see longitudinal plan of care) Comprehensive medication review performed; medication list updated in electronic medical record  Diabetes: Goal on Track (progressing): YES. A1C 7% --> 6.4%; GFR 78 Current treatment: starting ozempic 0.'25mg'$  weekly (titrate every 4 weeks as tolerated) DISCONTINUE Jardiance '25mg'$  daily (via [employer] cares patient assistance program)-->patient having recurrent yeast infections on jardiance Will transition patient to semglutide (discontinue glipizide)  Discussed GLP1 injection -- Denies personal and family history of Medullary thyroid cancer (MTC) Application submitted for Ozempic novo nordisk patient assistance  Sample given and patient was able to self-inject glipizide '5mg'$  twice daily with meals metformin 1g twice daily with meals;  Current glucose readings: fasting glucose: <130, post prandial glucose: <190 Denies hypoglycemic/hyperglycemic symptoms Discussed meal planning options and Plate method for healthy eating Avoid sugary drinks and desserts Incorporate balanced protein, non starchy veggies, 1 serving of carbohydrate  with each meal Increase water intake Increase physical activity as able Current exercise: N/A Recommended Potentially stopping glipizide, decreasing metformin and adding a GLP1 at f/u if a1c continues to increase; would help with weight/lipids as well Assessed patient finances. Application submitted for Ozempic novo nordisk patient assistance     Blood pressure controlled; reviewed/documented  Hyperlipidemia:  Goal on Track (progressing): YES. Uncontrolled-LDL 89, GOAL <70; current treatment:LOVASTATIN '40MG'$ , ZETIA '10MG'$ ;  Medications previously tried: PRAVASTATIN STATIN INDUCED MYOPATHY--CHART REVIEW COMPLETED AND NO OTHER STATINS FOUND, PATIENT HAS TRIED SEVERAL OTHERS, BUT DOESN'T RECALL NAMES; STABLE ON CURRENT REGIMEN Current dietary patterns: REVIEWED HEART HEALTHY DIET Recommended HEART HEALTHY DIET BEFORE ADJUSTING REGIMEN; ICD10-G72 DOCUMENTED FOR STATIN INDUCED MYOPATHY Lipid Panel     Component Value Date/Time   CHOL 145 09/08/2019 1015   TRIG 156 (H) 09/08/2019 1015   HDL 37 (L) 09/08/2019 1015   CHOLHDL 3.9 09/08/2019 1015   LDLCALC 81 09/08/2019 1015   LDLDIRECT 89 06/03/2021 1324   LABVLDL 27 09/08/2019 1015    Patient Goals/Self-Care Activities patient will:  - take medications as prescribed as evidenced by patient report and record review check glucose 3X WEEKLY OR IF SYMPTOMATIC, document, and provide at future appointments check blood pressure 3X WEEKLY OR IF SYMPTOMATIC, document, and provide at future appointments collaborate with provider on medication access solutions target a minimum of 150 minutes of moderate intensity exercise weekly engage in dietary modifications by FOLLOWING HEALTHY PLATE METHOD AND HEART HEALTHY DIET   Plan: Telephone follow up appointment with care management team member scheduled for:  12/03/21  Signature Regina Eck, PharmD, BCPS Clinical Pharmacist, [place]  II Phone  [phone]   Please call the care guide team at [phone] if you need to cancel or reschedule your appointment.   The patient verbalized understanding of instructions, educational materials, and care plan provided today and DECLINED offer  to receive copy of patient instructions, educational materials, and care plan.

## 2021-11-26 NOTE — Telephone Encounter (Signed)
Patient unable to tolerate jardiance due to severe recurrent vaginitis Please cancel [facility] cares for jardiance No rush

## 2021-11-28 DIAGNOSIS — Z7984 Long term (current) use of oral hypoglycemic drugs: Secondary | ICD-10-CM | POA: Diagnosis not present

## 2021-11-28 DIAGNOSIS — E785 Hyperlipidemia, unspecified: Secondary | ICD-10-CM | POA: Diagnosis not present

## 2021-11-28 DIAGNOSIS — E1169 Type 2 diabetes mellitus with other specified complication: Secondary | ICD-10-CM

## 2021-12-03 ENCOUNTER — Telehealth: Payer: Medicare Other

## 2021-12-04 ENCOUNTER — Telehealth: Payer: Self-pay

## 2021-12-04 NOTE — Telephone Encounter (Signed)
Received notification from [place] regarding approval for [place]. Patient assistance approved from 11/27/21 to 04/30/22.  MEDICATION SHIPS TO OFFICE  Phone: [phone]

## 2021-12-10 ENCOUNTER — Other Ambulatory Visit: Payer: Self-pay | Admitting: Family Medicine

## 2021-12-16 ENCOUNTER — Ambulatory Visit (INDEPENDENT_AMBULATORY_CARE_PROVIDER_SITE_OTHER): Payer: Medicare Other | Admitting: Pharmacist

## 2021-12-16 DIAGNOSIS — G720 Drug-induced myopathy: Secondary | ICD-10-CM

## 2021-12-16 DIAGNOSIS — E1169 Type 2 diabetes mellitus with other specified complication: Secondary | ICD-10-CM

## 2021-12-18 NOTE — Progress Notes (Signed)
Chronic Care Management Pharmacy Note  12/16/2021 Name:  Emily [name] MRN:  [id number] DOB:  1951/05/24  Summary: Diabetes: Goal on Track (progressing): YES. A1C 7% --> 6.4%; GFR 78 Current treatment: ozempic 0.20m weekly (titrate every 4 weeks as tolerated), metformin Intolerance: DISCONTINUED Jardiance 220mdaily (via [employer] patient assistance program)-->patient having recurrent yeast infections on jardiance; updated intolerance list CONTINUE Ozempic 0.2516meekly (discontinue glipizide) -- will plan to increase to Ozempic 0.5mg63mekly when patient assistance supply arrives Discussed GLP1 injection -- Denies personal and family history of Medullary thyroid cancer (MTC) approved for Ozempic novo nordisk patient assistance -- awaiting shipment Sample given and patient was able to self-inject at last visit Metformin 1g twice daily with meals;  Current glucose readings: fasting glucose: <130, post prandial glucose: <190 Denies hypoglycemic/hyperglycemic symptoms Discussed meal planning options and Plate method for healthy eating Avoid sugary drinks and desserts Incorporate balanced protein, non starchy veggies, 1 serving of carbohydrate with each meal Increase water intake Increase physical activity as able Current exercise: N/A Recommended stopped glipizide, continue GLP1--will increase to 0.5mg 54me patient assistance supply comes in; would help with weight/lipids as well Assessed patient finances. Application submitted for Ozempic novo nordisk patient assistance     Blood pressure controlled; reviewed/documented  Hyperlipidemia:  Goal on Track (progressing): YES. Uncontrolled-LDL 89, GOAL <70; current treatment:LOVASTATIN 40MG, ZETIA 10MG;  Medications previously tried: PRAVASTATIN STATIN INDUCED MYOPATHY--CHART REVIEW COMPLETED AND NO OTHER STATINS FOUND, PATIENT HAS TRIED SEVERAL OTHERS, BUT DOESN'T RECALL NAMES; STABLE ON CURRENT REGIMEN Current dietary patterns: REVIEWED  HEART HEALTHY DIET Recommended HEART HEALTHY DIET BEFORE ADJUSTING REGIMEN; ICD10-G72 DOCUMENTED FOR STATIN INDUCED MYOPATHY Lipid Panel     Component Value Date/Time   CHOL 145 09/08/2019 1015   TRIG 156 (H) 09/08/2019 1015   HDL 37 (L) 09/08/2019 1015   CHOLHDL 3.9 09/08/2019 1015   LDLCALC 81 09/08/2019 1015   LDLDIRECT 89 06/03/2021 1324   LABVLDL 27 09/08/2019 1015   Patient Goals/Self-Care Activities patient will:  - take medications as prescribed as evidenced by patient report and record review check glucose 3X WEEKLY OR IF SYMPTOMATIC, document, and provide at future appointments check blood pressure 3X WEEKLY OR IF SYMPTOMATIC, document, and provide at future appointments collaborate with provider on medication access solutions target a minimum of 150 minutes of moderate intensity exercise weekly engage in dietary modifications by FOLLOWING HEALTHY PLATE METHOD AND HEART HEALTHY DIET   Subjective: JoyceStefannie [name] 70 y.71 year old female who is a primary patient of GottsJanora Norlander  The CCM team was consulted for assistance with disease management and care coordination needs.    Engaged with patient by telephone for follow up visit in response to provider referral for pharmacy case management and/or care coordination services.   Consent to Services:  The patient was given information about Chronic Care Management services, agreed to services, and gave verbal consent prior to initiation of services.  Please see initial visit note for detailed documentation.   Patient Care Team: GottsJanora Norlanderas PCP - General (Family Medicine) HochrMinus Breedingas PCP - Cardiology (Cardiology) CamniConstance Hawas PCP - Electrophysiology (Cardiology) HochrMinus Breedingas Consulting Physician (Cardiology) DahlsFranchot Galloas Consulting Physician (Urology) ConnoJanie Morning(Gastroenterology) Mims,Jonna Clarkas Referring Physician  (Obstetrics and Gynecology) DevesBo Merinoas Consulting Physician (Rheumatology) BrabhSerafina Mitchellas Consulting Physician (Vascular Surgery) PruitLavera Guise ([facility])  Objective:  Lab  Results  Component Value Date   CREATININE 0.81 06/03/2021   CREATININE 0.69 01/17/2021   CREATININE 0.62 09/04/2020    Lab Results  Component Value Date   HGBA1C 6.4 (H) 09/30/2021   Last diabetic Eye exam:  Lab Results  Component Value Date/Time   HMDIABEYEEXA No Retinopathy 01/10/2020 12:00 AM    Last diabetic Foot exam: No results found for: "HMDIABFOOTEX"      Component Value Date/Time   CHOL 145 09/08/2019 1015   TRIG 156 (H) 09/08/2019 1015   HDL 37 (L) 09/08/2019 1015   CHOLHDL 3.9 09/08/2019 1015   LDLCALC 81 09/08/2019 1015   LDLDIRECT 89 06/03/2021 1324       Latest Ref Rng & Units 08/30/2020    5:34 AM 08/30/2020   12:38 AM 08/20/2020    1:12 PM  Hepatic Function  Total Protein 6.5 - 8.1 g/dL 7.2  7.6  7.3   Albumin 3.5 - 5.0 g/dL 3.9  4.1  4.4   AST 15 - 41 U/L _0 ALT 0 - 44 U/L _1 Alk Phosphatase 38 - 126 U/L 93  96  111   Total Bilirubin 0.3 - 1.2 mg/dL 1.4  1.8  0.5     Lab Results  Component Value Date/Time   TSH 0.802 08/30/2020 05:34 AM   TSH 1.330 08/20/2020 01:12 PM   TSH 4.110 11/20/2019 08:25 AM   FREET4 1.45 08/20/2020 01:12 PM   FREET4 1.02 11/06/2015 07:54 AM       Latest Ref Rng & Units 06/03/2021    1:24 PM 09/04/2020    2:20 PM 08/30/2020    5:34 AM  CBC  WBC 3.4 - 10.8 x10E3/uL 9.9  7.6  9.5   Hemoglobin 11.1 - 15.9 g/dL 12.7  11.4  12.2   Hematocrit 34.0 - 46.6 % 38.3  34.4  39.5   Platelets 150 - 450 x10E3/uL 247  253  215     No results found for: "VD25OH"  Clinical ASCVD: No  The 10-year ASCVD risk score (Arnett DK, et al., 2019) is: 22.8%   Values used to calculate the score:     Age: 81 years     Sex: Female     Is Non-Hispanic African American: No     Diabetic: Yes     Tobacco smoker: No      Systolic Blood Pressure: 476 mmHg     Is BP treated: Yes     HDL Cholesterol: 37 mg/dL     Total Cholesterol: 145 mg/dL    Other: (CHADS2VASc if Afib, PHQ9 if depression, MMRC or CAT for COPD, ACT, DEXA)  Social History   Tobacco Use  Smoking Status Never  Smokeless Tobacco Never   BP Readings from Last 3 Encounters:  10/22/21 122/60  10/22/21 (!) 137/58  10/13/21 (!) 139/53   Pulse Readings from Last 3 Encounters:  10/22/21 (!) 51  10/22/21 (!) 52  10/13/21 (!) 43   Wt Readings from Last 3 Encounters:  10/22/21 218 lb 3.2 oz (99 kg)  10/22/21 218 lb 3.2 oz (99 kg)  10/13/21 218 lb (98.9 kg)    Assessment: Review of patient past medical history, allergies, medications, health status, including review of consultants reports, laboratory and other test data, was performed as part of comprehensive evaluation and provision of chronic care management services.   SDOH:  (Social Determinants of Health) assessments and interventions performed:    CCM  Care Plan  Allergies  Allergen Reactions   Jardiance [Empagliflozin] Other (See Comments)    RECURRENT VAGINITIS   Quinapril Hcl Cough   Statins Other (See Comments)    Not all Statins but some cause cough and pain in legs.   Tape Other (See Comments)    Redness, please use "paper" tape    Medications Reviewed Today     Reviewed by Lavera Guise, [facility] (Pharmacist) on 12/18/21 at 1202  Med List Status: <None>   Medication Order Taking? Sig Documenting Provider Last Dose Status Informant  aspirin EC 81 MG tablet [id number] No Take 81 mg by mouth daily. [provider] Taking Active Self  bisacodyl (DULCOLAX) 5 MG EC tablet [id number] No Take 5 mg by mouth daily. [provider] Taking Active   cyanocobalamin ((VITAMIN B-12)) injection 1,000 mcg [id number]   Timmothy Euler, MD  Active   diltiazem (CARDIZEM CD) 180 MG 24 hr capsule [id number] No TAKE 1 CAPSULE BY MOUTH EVERY DAY Minus Breeding, MD Taking  Active   diltiazem (CARDIZEM) 30 MG tablet [id number] No Take 30 mg by mouth as needed. [provider] Taking Active   ezetimibe (ZETIA) 10 MG tablet [id number] No TAKE 1 TABLET BY MOUTH EVERY DAY Minus Breeding, MD Taking Active   flecainide (TAMBOCOR) 100 MG tablet [id number] No Take 1 tablet (100 mg total) by mouth 2 (two) times daily. Minus Breeding, MD Taking Active   furosemide (LASIX) 20 MG tablet [id number] No TAKE 1 TABLET (20 MG TOTAL) BY MOUTH DAILY AS NEEDED FOR FLUID. Ronnie Doss M, DO Taking Active   glipiZIDE (GLUCOTROL) 5 MG tablet [id number]  TAKE 1 TABLET BY MOUTH TWICE A DAY Gottschalk, Ashly M, DO  Active   ketoconazole (NIZORAL) 2 % cream [id number] No Apply 1 application topically daily. Prn intertrigo rash Ronnie Doss M, DO Taking Active   levothyroxine (SYNTHROID) 112 MCG tablet [id number] No TAKE 1 TABLET BY MOUTH EVERY OTHER DAY, ALTERNATING WITH 1 & 1/2 TABLETS EVERY OTHER DAY Ronnie Doss M, DO Taking Active   losartan (COZAAR) 25 MG tablet [id number]  TAKE 1 TABLET (25 MG TOTAL) BY MOUTH DAILY. Minus Breeding, MD  Active   lovastatin (MEVACOR) 40 MG tablet [id number]  TAKE 1 TABLET BY MOUTH EVERY DAY IN THE Adele Barthel, Jeneen Rinks, MD  Active   metFORMIN (GLUCOPHAGE) 1000 MG tablet [id number]  TAKE 1 TABLET (1,000 MG TOTAL) BY MOUTH TWICE A DAY WITH FOOD Ronnie Doss M, DO  Active   metoprolol succinate (TOPROL-XL) 100 MG 24 hr tablet [id number] No TAKE 1 TABLET BY MOUTH DAILY. TAKE WITH OR IMMEDIATELY FOLLOWING A MEAL. Ronnie Doss M, DO Taking Active   rOPINIRole (REQUIP) 0.5 MG tablet [id number]  TAKE 1 TABLET BY MOUTH EVERYDAY AT BEDTIME Ronnie Doss M, DO  Active   Semaglutide,0.25 or 0.5MG/DOS, 2 MG/3ML SOPN [id number]  Inject 0.25 mg into the skin once a week. Janora Norlander, DO  Active             Patient Active Problem List   Diagnosis Date Noted   Acute lower UTI    Atrial fibrillation with RVR ([place]) 08/30/2020    Chest pain 08/30/2020   Nonrheumatic aortic valve stenosis 03/19/2020   Persistent atrial fibrillation ([place]) 01/16/2020   Bilateral carotid artery stenosis 10/12/2019   Educated about COVID-19 virus infection 10/12/2019   Acute on chronic blood loss anemia 09/25/2019   OSA on CPAP 09/25/2019   Upper GI bleed 09/25/2019   History  of adenomatous polyp of colon 08/30/2019   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 device 07/16/2018   Chronic anticoagulation 03/03/2018   Melena 03/03/2018   Restless leg syndrome 02/16/2018   Chronic left-sided low back pain with left-sided sciatica 02/16/2018   Morbid obesity ([place]) 02/14/2018   Bruit 02/14/2018   PAF (paroxysmal atrial fibrillation) ([place]) 02/14/2018   DDD (degenerative disc disease), lumbar 12/07/2017   Peripheral arterial disease ([place]) 07/06/2017   Type 2 diabetes mellitus with hyperlipidemia ([place]) 06/29/2017   Iron deficiency anemia 05/27/2017   Vitamin B12 deficiency 05/27/2017   Pain in joint, ankle and foot 06/03/2016   Carotid stenosis 08/30/2015   Bilateral carotid artery disease ([place]) 07/05/2015   Acute bronchitis 01/31/2015   Multiple gastric polyps 07/03/2014   Diabetes mellitus without complication ([place])    Hypertension associated with diabetes ([place]) 10/27/2013   Hypothyroidism due to acquired atrophy of thyroid 10/27/2013   Hypothyroidism 06/06/2010   Nonspecific elevation of levels of transaminase or lactic acid dehydrogenase (LDH) 02/14/2007   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/2007   Cellulitis and abscess of finger, unspecified 01/17/2007    Immunization History  Administered Date(s) Administered   Fluad Quad(high Dose 65+) 02/28/2019, 03/05/2020   Influenza, High Dose Seasonal PF 03/24/2016, 03/22/2018   Influenza, Seasonal, Injecte, Preservative Fre 06/05/2014, 07/02/2015   Influenza-Unspecified 03/24/2016   PFIZER(Purple Top)SARS-COV-2 Vaccination 07/10/2019, 08/03/2019, 03/13/2020    Pneumococcal Conjugate-13 07/09/2015   Pneumococcal Polysaccharide-23 01/30/2014, 02/28/2019   Tdap 05/26/2014    Conditions to be addressed/monitored: HLD and DMII  Care Plan : PHARMD MEDICATION MANAGEMENT  Updates made by Lavera Guise, RPH since 12/18/2021 12:00 AM     Problem: DISEASE PROGRESSION PREVENTION      Long-Range Goal: T2DM, HLD PHARMD GOALS   Recent Progress: On track  Priority: High  Note:   Current Barriers:  Unable to independently afford treatment regimen Unable to maintain control of T2DM Suboptimal therapeutic regimen for T2DM, HLD  Pharmacist Clinical Goal(s):  patient will verbalize ability to afford treatment regimen achieve control of T2DM, HLD as evidenced by GOAL A1C<7%, LDL<70 maintain control of T2MD, HLD as evidenced by GOAL A1C<7%, LDL<70  adhere to plan to optimize therapeutic regimen for T2DM, HLD as evidenced by report of adherence to recommended medication management changes through collaboration with PharmD and provider.   Interventions: 1:1 collaboration with Janora Norlander, DO regarding development and update of comprehensive plan of care as evidenced by provider attestation and co-signature Inter-disciplinary care team collaboration (see longitudinal plan of care) Comprehensive medication review performed; medication list updated in electronic medical record  Diabetes: Goal on Track (progressing): YES. A1C 7% --> 6.4%; GFR 78 Current treatment: ozempic 0.19m weekly (titrate every 4 weeks as tolerated), metformin Intolerance: DISCONTINUED Jardiance 255mdaily (via [employer] patient assistance program)-->patient having recurrent yeast infections on jardiance; updated intolerance list CONTINUE Ozempic 0.2554meekly (discontinue glipizide) -- will plan to increase to Ozempic 0.5mg23mekly when patient assistance supply arrives Discussed GLP1 injection -- Denies personal and family history of Medullary thyroid cancer (MTC) approved for  Ozempic novo nordisk patient assistance -- awaiting shipment Sample given and patient was able to self-inject at last visit Metformin 1g twice daily with meals;  Current glucose readings: fasting glucose: <130, post prandial glucose: <190 Denies hypoglycemic/hyperglycemic symptoms Discussed meal planning options and Plate method for healthy eating Avoid sugary drinks and desserts Incorporate balanced protein, non starchy veggies, 1 serving of carbohydrate with each meal Increase water intake Increase physical activity  as able Current exercise: N/A Recommended stopped glipizide, continue GLP1--will increase to 0.2m once patient assistance supply comes in; would help with weight/lipids as well Assessed patient finances. Application submitted for Ozempic novo nordisk patient assistance     Blood pressure controlled; reviewed/documented  Hyperlipidemia:  Goal on Track (progressing): YES. Uncontrolled-LDL 89, GOAL <70; current treatment:LOVASTATIN 40MG, ZETIA 10MG;  Medications previously tried: PRAVASTATIN STATIN INDUCED MYOPATHY--CHART REVIEW COMPLETED AND NO OTHER STATINS FOUND, PATIENT HAS TRIED SEVERAL OTHERS, BUT DOESN'T RECALL NAMES; STABLE ON CURRENT REGIMEN Current dietary patterns: REVIEWED HEART HEALTHY DIET Recommended HEART HEALTHY DIET BEFORE ADJUSTING REGIMEN; ICD10-G72 DOCUMENTED FOR STATIN INDUCED MYOPATHY Lipid Panel     Component Value Date/Time   CHOL 145 09/08/2019 1015   TRIG 156 (H) 09/08/2019 1015   HDL 37 (L) 09/08/2019 1015   CHOLHDL 3.9 09/08/2019 1015   LDLCALC 81 09/08/2019 1015   LDLDIRECT 89 06/03/2021 1324   LABVLDL 27 09/08/2019 1015  Patient Goals/Self-Care Activities patient will:  - take medications as prescribed as evidenced by patient report and record review check glucose 3X WEEKLY OR IF SYMPTOMATIC, document, and provide at future appointments check blood pressure 3X WEEKLY OR IF SYMPTOMATIC, document, and provide at future  appointments collaborate with provider on medication access solutions target a minimum of 150 minutes of moderate intensity exercise weekly engage in dietary modifications by FOLLOWING HEALTHY PLATE METHOD AND HEART HEALTHY DIET      Medication Assistance:  ozempic obtained through novonordisk medication assistance program.  Enrollment ends 04/30/22  Patient's preferred pharmacy is:  [id number]: [phone]: [phone] Follow Up:  Patient agrees to Care Plan and Follow-up.  Plan: Telephone follow up appointment with care management team member scheduled for:  1 month    JuRegina EckPharmD, BCPS Clinical Pharmacist, [place] Phone [phone]

## 2021-12-18 NOTE — Patient Instructions (Addendum)
Visit Information  Following are the goals we discussed today:  Current Barriers:  Unable to independently afford treatment regimen Unable to maintain control of T2DM Suboptimal therapeutic regimen for T2DM, HLD  Pharmacist Clinical Goal(s):  patient will verbalize ability to afford treatment regimen achieve control of T2DM, HLD as evidenced by GOAL A1C<7%, LDL<70 maintain control of T2MD, HLD as evidenced by GOAL A1C<7%, LDL<70  adhere to plan to optimize therapeutic regimen for T2DM, HLD as evidenced by report of adherence to recommended medication management changes through collaboration with PharmD and provider.   Interventions: 1:1 collaboration with Janora Norlander, DO regarding development and update of comprehensive plan of care as evidenced by provider attestation and co-signature Inter-disciplinary care team collaboration (see longitudinal plan of care) Comprehensive medication review performed; medication list updated in electronic medical record  Diabetes: Goal on Track (progressing): YES. A1C 7% --> 6.4%; GFR 78 Current treatment: ozempic 0.'25mg'$  weekly (titrate every 4 weeks as tolerated), metformin Intolerance: DISCONTINUED Jardiance '25mg'$  daily (via [employer] patient assistance program)-->patient having recurrent yeast infections on jardiance; updated intolerance list CONTINUE Ozempic 0.'25mg'$  weekly (discontinue glipizide) -- will plan to increase to Ozempic 0.'5mg'$  weekly when patient assistance supply arrives Discussed GLP1 injection -- Denies personal and family history of Medullary thyroid cancer (MTC) approved for Ozempic novo nordisk patient assistance -- awaiting shipment Sample given and patient was able to self-inject at last visit Metformin 1g twice daily with meals;  Current glucose readings: fasting glucose: <130, post prandial glucose: <190 Denies hypoglycemic/hyperglycemic symptoms Discussed meal planning options and Plate method for healthy eating Avoid  sugary drinks and desserts Incorporate balanced protein, non starchy veggies, 1 serving of carbohydrate with each meal Increase water intake Increase physical activity as able Current exercise: N/A Recommended stopped glipizide, continue GLP1--will increase to 0.'5mg'$  once patient assistance supply comes in; would help with weight/lipids as well Assessed patient finances. Application submitted for Ozempic novo nordisk patient assistance     Blood pressure controlled; reviewed/documented  Hyperlipidemia:  Goal on Track (progressing): YES. Uncontrolled-LDL 89, GOAL <70; current treatment:LOVASTATIN '40MG'$ , ZETIA '10MG'$ ;  Medications previously tried: PRAVASTATIN STATIN INDUCED MYOPATHY--CHART REVIEW COMPLETED AND NO OTHER STATINS FOUND, PATIENT HAS TRIED SEVERAL OTHERS, BUT DOESN'T RECALL NAMES; STABLE ON CURRENT REGIMEN Current dietary patterns: REVIEWED HEART HEALTHY DIET Recommended HEART HEALTHY DIET BEFORE ADJUSTING REGIMEN; ICD10-G72 DOCUMENTED FOR STATIN INDUCED MYOPATHY Lipid Panel     Component Value Date/Time   CHOL 145 09/08/2019 1015   TRIG 156 (H) 09/08/2019 1015   HDL 37 (L) 09/08/2019 1015   CHOLHDL 3.9 09/08/2019 1015   LDLCALC 81 09/08/2019 1015   LDLDIRECT 89 06/03/2021 1324   LABVLDL 27 09/08/2019 1015   Patient Goals/Self-Care Activities patient will:  - take medications as prescribed as evidenced by patient report and record review check glucose 3X WEEKLY OR IF SYMPTOMATIC, document, and provide at future appointments check blood pressure 3X WEEKLY OR IF SYMPTOMATIC, document, and provide at future appointments collaborate with provider on medication access solutions target a minimum of 150 minutes of moderate intensity exercise weekly engage in dietary modifications by FOLLOWING HEALTHY PLATE METHOD AND HEART HEALTHY DIET   Plan: Telephone follow up appointment with care management team member scheduled for:  1 month  Signature Regina Eck, PharmD,  BCPS Clinical Pharmacist, [place]  [place] Phone [phone]   Please call the care guide team at [phone] if you need to cancel or reschedule your appointment.   The patient verbalized understanding of  instructions, educational materials, and care plan provided today and DECLINED offer to receive copy of patient instructions, educational materials, and care plan.

## 2021-12-23 ENCOUNTER — Ambulatory Visit (INDEPENDENT_AMBULATORY_CARE_PROVIDER_SITE_OTHER): Payer: Medicare Other

## 2021-12-23 DIAGNOSIS — E538 Deficiency of other specified B group vitamins: Secondary | ICD-10-CM | POA: Diagnosis not present

## 2021-12-23 NOTE — Progress Notes (Signed)
Cyanocobalamin injection given to left deltoid.  Patient tolerated well. 

## 2021-12-24 ENCOUNTER — Other Ambulatory Visit: Payer: Self-pay | Admitting: Family Medicine

## 2021-12-29 DIAGNOSIS — E785 Hyperlipidemia, unspecified: Secondary | ICD-10-CM

## 2021-12-29 DIAGNOSIS — E1169 Type 2 diabetes mellitus with other specified complication: Secondary | ICD-10-CM | POA: Diagnosis not present

## 2022-01-06 ENCOUNTER — Encounter: Payer: Self-pay | Admitting: Family Medicine

## 2022-01-06 ENCOUNTER — Ambulatory Visit (INDEPENDENT_AMBULATORY_CARE_PROVIDER_SITE_OTHER): Payer: Medicare Other | Admitting: Family Medicine

## 2022-01-06 VITALS — BP 137/51 | HR 54 | Temp 96.6°F | Resp 20 | Ht 63.0 in | Wt 222.0 lb

## 2022-01-06 DIAGNOSIS — S80922A Unspecified superficial injury of left lower leg, initial encounter: Secondary | ICD-10-CM

## 2022-01-06 DIAGNOSIS — L989 Disorder of the skin and subcutaneous tissue, unspecified: Secondary | ICD-10-CM | POA: Diagnosis not present

## 2022-01-06 DIAGNOSIS — T148XXA Other injury of unspecified body region, initial encounter: Secondary | ICD-10-CM

## 2022-01-06 MED ORDER — TRIAMCINOLONE ACETONIDE 0.1 % EX CREA: 1.0000 | TOPICAL_CREAM | Freq: Two times a day (BID) | CUTANEOUS | 0 refills | Status: DC

## 2022-01-06 NOTE — Progress Notes (Signed)
Assessment & Plan:  1. Skin lesion of left leg - triamcinolone cream (KENALOG) 0.1 %; Apply 1 Application topically 2 (two) times daily.  Dispense: 80 g; Refill: 0  2. Blood blister Reassurance provided. Advised not to pop blister. Discussed s/s of infection that she needs to make [place] aware of if they occur.    Follow up plan: Return if symptoms worsen or fail to improve.  Hendricks Limes, MSN, APRN, FNP-C [facility]  Subjective:   Patient ID: Emily [name], female    DOB: 30-Nov-1950, 71 y.o.   MRN: [id number]  HPI: Emily [name] is a 71 y.o. female presenting on 01/06/2022 for Wound Check (Place on left leg - 1- on calf, 1 on thigh - both noticed about 2 week ago )  Patient reports two spots on her left leg that she noticed two weeks ago. She as been intermittently applying neosporin. One is on the back side of her left thigh - this one is not open and never has been. It does itch. The other is on the back side of her left calf - this one is closed and looks like a blood blister. She is a diabetic, which is controlled with an A1c of 6.4 three months ago. She is concerned because neither skin lesion has improved over the past two weeks since she first noticed them. She is unsure what caused either of them.    ROS: Negative unless specifically indicated above in HPI.   Relevant past medical history reviewed and updated as indicated.   Allergies and medications reviewed and updated.   Current Outpatient Medications:    aspirin EC 81 MG tablet, Take 81 mg by mouth daily., Disp: , Rfl:    bisacodyl (DULCOLAX) 5 MG EC tablet, Take 5 mg by mouth daily., Disp: , Rfl:    diltiazem (CARDIZEM CD) 180 MG 24 hr capsule, TAKE 1 CAPSULE BY MOUTH EVERY DAY, Disp: 90 capsule, Rfl: 2   diltiazem (CARDIZEM) 30 MG tablet, Take 30 mg by mouth as needed., Disp: , Rfl:    ezetimibe (ZETIA) 10 MG tablet, TAKE 1 TABLET BY MOUTH EVERY DAY, Disp: 90 tablet, Rfl: 3   flecainide  (TAMBOCOR) 100 MG tablet, Take 1 tablet (100 mg total) by mouth 2 (two) times daily., Disp: 180 tablet, Rfl: 3   furosemide (LASIX) 20 MG tablet, TAKE 1 TABLET (20 MG TOTAL) BY MOUTH DAILY AS NEEDED FOR FLUID., Disp: 90 tablet, Rfl: 0   glipiZIDE (GLUCOTROL) 5 MG tablet, TAKE 1 TABLET BY MOUTH TWICE A DAY, Disp: 180 tablet, Rfl: 0   ketoconazole (NIZORAL) 2 % cream, Apply 1 application topically daily. Prn intertrigo rash, Disp: 60 g, Rfl: 1   levothyroxine (SYNTHROID) 112 MCG tablet, TAKE 1 TABLET BY MOUTH EVERY OTHER DAY, ALTERNATING WITH 1 & 1/2 TABLETS EVERY OTHER DAY, Disp: 112 tablet, Rfl: 0   losartan (COZAAR) 25 MG tablet, TAKE 1 TABLET (25 MG TOTAL) BY MOUTH DAILY., Disp: 90 tablet, Rfl: 3   lovastatin (MEVACOR) 40 MG tablet, TAKE 1 TABLET BY MOUTH EVERY DAY IN THE EVENING, Disp: 90 tablet, Rfl: 1   metFORMIN (GLUCOPHAGE) 1000 MG tablet, TAKE 1 TABLET (1,000 MG TOTAL) BY MOUTH TWICE A DAY WITH FOOD, Disp: 180 tablet, Rfl: 0   metoprolol succinate (TOPROL-XL) 100 MG 24 hr tablet, TAKE 1 TABLET BY MOUTH DAILY. TAKE WITH OR IMMEDIATELY FOLLOWING A MEAL., Disp: 90 tablet, Rfl: 1   rOPINIRole (REQUIP) 0.5 MG tablet, TAKE 1 TABLET BY MOUTH  EVERYDAY AT BEDTIME, Disp: 90 tablet, Rfl: 0   Semaglutide,0.25 or 0.'5MG'$ /DOS, 2 MG/3ML SOPN, Inject 0.25 mg into the skin once a week., Disp: 3 mL, Rfl:   Current Facility-Administered Medications:    cyanocobalamin ((VITAMIN B-12)) injection 1,000 mcg, 1,000 mcg, Intramuscular, Q30 days, Timmothy Euler, MD, 1,000 mcg at 12/23/21 2979  Allergies  Allergen Reactions   Jardiance [Empagliflozin] Other (See Comments)    RECURRENT VAGINITIS   Quinapril Hcl Cough   Statins Other (See Comments)    Not all Statins but some cause cough and pain in legs.   Tape Other (See Comments)    Redness, please use "paper" tape    Objective:   BP (!) 137/51   Pulse (!) 54   Temp (!) 96.6 F (35.9 C)   Resp 20   Ht '5\' 3"'$  (1.6 m)   Wt 222 lb (100.7 kg)   SpO2  96%   BMI 39.33 kg/m    Physical Exam Vitals reviewed.  Constitutional:      General: She is not in acute distress.    Appearance: Normal appearance. She is not ill-appearing, toxic-appearing or diaphoretic.  HENT:     Head: Normocephalic and atraumatic.  Eyes:     General: No scleral icterus.       Right eye: No discharge.        Left eye: No discharge.     Conjunctiva/sclera: Conjunctivae normal.  Cardiovascular:     Rate and Rhythm: Normal rate.  Pulmonary:     Effort: Pulmonary effort is normal. No respiratory distress.  Musculoskeletal:        General: Normal range of motion.     Cervical back: Normal range of motion.  Skin:    General: Skin is warm and dry.     Capillary Refill: Capillary refill takes less than 2 seconds.       Neurological:     General: No focal deficit present.     Mental Status: She is alert and oriented to person, place, and time. Mental status is at baseline.  Psychiatric:        Mood and Affect: Mood normal.        Behavior: Behavior normal.        Thought Content: Thought content normal.        Judgment: Judgment normal.

## 2022-01-08 ENCOUNTER — Other Ambulatory Visit: Payer: Self-pay | Admitting: Cardiology

## 2022-01-08 DIAGNOSIS — I480 Paroxysmal atrial fibrillation: Secondary | ICD-10-CM

## 2022-01-26 ENCOUNTER — Encounter: Payer: Self-pay | Admitting: Nurse Practitioner

## 2022-01-26 ENCOUNTER — Ambulatory Visit (INDEPENDENT_AMBULATORY_CARE_PROVIDER_SITE_OTHER): Payer: Medicare Other | Admitting: Nurse Practitioner

## 2022-01-26 VITALS — BP 140/66 | HR 72 | Temp 97.1°F | Resp 20 | Ht 63.0 in | Wt 220.0 lb

## 2022-01-26 DIAGNOSIS — U071 COVID-19: Secondary | ICD-10-CM

## 2022-01-26 MED ORDER — MOLNUPIRAVIR EUA 200MG CAPSULE: 4.0000 | ORAL_CAPSULE | Freq: Two times a day (BID) | ORAL | 0 refills | Status: AC

## 2022-01-26 NOTE — Patient Instructions (Signed)
    Person Under Monitoring Name: Emily [name]  Location: [address] [place] [address]   CORONAVIRUS DISEASE 2019 (COVID-19) Guidance for Persons Under Investigation You are being tested for the virus that causes coronavirus disease 2019 (COVID-19). Public health actions are necessary to ensure protection of your health and the health of others, and to prevent further spread of infection. COVID-19 is caused by a virus that can cause symptoms, such as fever, cough, and shortness of breath. The primary transmission from person to person is by coughing or sneezing. On June 30, 2018, the [place] announced a [employer] Emergency of International Concern and on July 01, 2018 the U.S. Department of Health and Human Services declared a public health emergency. If the virus that causesCOVID-19 spreads in the community, it could have severe public health consequences.  As a person under investigation for COVID-19, the [place] advises you to adhere to the following guidance until your test results are reported to you. If your test result is positive, you will receive additional information from your provider and your local health department at that time.  Remain at home until you are cleared by your health provider or public health authorities.  Keep a log of visitors to your home using the form provided. Any visitors to your home must be aware of your isolation status. If you plan to move to a new address or leave the county, notify the local health department in your county. Call a doctor or seek care if you have an urgent medical need. Before seeking medical care, call ahead and get instructions from the provider before arriving at the medical office, clinic or hospital. Notify them that you are being tested for the virus that causes COVID-19 so arrangements can be made, as necessary, to  prevent transmission to others in the healthcare setting. Next, notify the local health department in your county. If a medical emergency arises and you need to call 911, inform the first responders that you are being tested for the virus that causes COVID-19. Next, notify the local health department in your county. Adhere to all guidance set forth by the [place] for [facility] of patients that is based on guidance from the Center for Disease Control and Prevention with suspected or confirmed COVID-19. It is provided with this guidance for Persons Under Investigation.  Your health and the health of our community are our top priorities. Public Health officials remain available to provide assistance and counseling to you about COVID-19 and compliance with this guidance.  Provider: ____________________________________________________________ Date: ______/_____/_________  By signing below, you acknowledge that you have read and agree to comply with this Guidance for Persons Under Investigation. ______________________________________________________________ Date: ______/_____/_________  WHO DO I CALL? You can find a list of local health departments here: [email or web address] Health Department: ____________________________________________________________________ Contact Name: ________________________________________________________________________ Telephone: ___________________________________________________________________________  Marice Potter, [place], Communicable Disease Branch COVID-19 Guidance for Persons Under Investigation August 06, 2018

## 2022-01-26 NOTE — Progress Notes (Signed)
Subjective:    Patient ID: Emily [name], female    DOB: 02/11/1951, 71 y.o.   MRN: [id number]   Chief Complaint: URI   URI  This is a new problem. The current episode started yesterday. The problem has been gradually worsening. There has been no fever. Associated symptoms include congestion, coughing and rhinorrhea. Pertinent negatives include no headaches or sore throat. She has tried nothing for the symptoms. The treatment provided no relief.  Positive covid test at home.     Review of Systems  HENT:  Positive for congestion and rhinorrhea. Negative for sore throat.   Respiratory:  Positive for cough.   Neurological:  Negative for headaches.       Objective:   Physical Exam Vitals reviewed.  Constitutional:      Appearance: Normal appearance.  HENT:     Right Ear: Tympanic membrane normal.     Left Ear: Tympanic membrane normal.     Nose: Congestion and rhinorrhea present.     Mouth/Throat:     Mouth: Mucous membranes are moist.  Eyes:     Extraocular Movements: Extraocular movements intact.     Pupils: Pupils are equal, round, and reactive to light.  Cardiovascular:     Rate and Rhythm: Normal rate. Rhythm irregular.     Heart sounds: Murmur (3/6) heard.  Pulmonary:     Effort: Pulmonary effort is normal.     Breath sounds: Normal breath sounds.  Musculoskeletal:     Cervical back: Normal range of motion and neck supple.  Skin:    General: Skin is warm.  Neurological:     General: No focal deficit present.     Mental Status: She is alert and oriented to person, place, and time.  Psychiatric:        Mood and Affect: Mood normal.        Behavior: Behavior normal.   BP (!) 140/66   Pulse 72   Temp (!) 97.1 F (36.2 C) (Oral)   Resp 20   Ht '5\' 3"'$  (1.6 m)   Wt 220 lb (99.8 kg)   SpO2 96%   BMI 38.97 kg/m          Assessment & Plan:   Emily [name] in today with chief complaint of URI   1. Positive self-administered antigen test for  COVID-19 1. Take meds as prescribed 2. Use a cool mist humidifier especially during the winter months and when heat has been humid. 3. Use saline nose sprays frequently 4. Saline irrigations of the nose can be very helpful if done frequently.  * 4X daily for 1 week*  * Use of a nettie pot can be helpful with this. Follow directions with this* 5. Drink plenty of fluids 6. Keep thermostat turn down low 7.For any cough or congestion- OTC meds as needed 8. For fever or aces or pains- take tylenol or ibuprofen appropriate for age and weight.  * for fevers greater than 101 orally you may alternate ibuprofen and tylenol every  3 hours.    - molnupiravir EUA (LAGEVRIO) 200 mg CAPS capsule; Take 4 capsules (800 mg total) by mouth 2 (two) times daily for 5 days.  Dispense: 40 capsule; Refill: 0    The above assessment and management plan was discussed with the patient. The patient verbalized understanding of and has agreed to the management plan. Patient is aware to call the clinic if symptoms persist or worsen. Patient is aware when to return to the clinic for  a follow-up visit. Patient educated on when it is appropriate to go to the emergency department.   [name] Done, FNP

## 2022-01-27 ENCOUNTER — Other Ambulatory Visit: Payer: Self-pay | Admitting: Family Medicine

## 2022-02-03 ENCOUNTER — Ambulatory Visit (INDEPENDENT_AMBULATORY_CARE_PROVIDER_SITE_OTHER): Payer: Medicare Other | Admitting: Family Medicine

## 2022-02-03 ENCOUNTER — Encounter: Payer: Self-pay | Admitting: Family Medicine

## 2022-02-03 ENCOUNTER — Ambulatory Visit: Payer: Medicare Other

## 2022-02-03 VITALS — BP 135/57 | HR 56 | Temp 97.4°F | Ht 63.0 in | Wt 220.0 lb

## 2022-02-03 DIAGNOSIS — E785 Hyperlipidemia, unspecified: Secondary | ICD-10-CM

## 2022-02-03 DIAGNOSIS — E538 Deficiency of other specified B group vitamins: Secondary | ICD-10-CM | POA: Diagnosis not present

## 2022-02-03 DIAGNOSIS — E1159 Type 2 diabetes mellitus with other circulatory complications: Secondary | ICD-10-CM

## 2022-02-03 DIAGNOSIS — E1169 Type 2 diabetes mellitus with other specified complication: Secondary | ICD-10-CM

## 2022-02-03 DIAGNOSIS — M5136 Other intervertebral disc degeneration, lumbar region: Secondary | ICD-10-CM

## 2022-02-03 DIAGNOSIS — E034 Atrophy of thyroid (acquired): Secondary | ICD-10-CM

## 2022-02-03 DIAGNOSIS — I152 Hypertension secondary to endocrine disorders: Secondary | ICD-10-CM

## 2022-02-03 DIAGNOSIS — Z0001 Encounter for general adult medical examination with abnormal findings: Secondary | ICD-10-CM | POA: Diagnosis not present

## 2022-02-03 DIAGNOSIS — I480 Paroxysmal atrial fibrillation: Secondary | ICD-10-CM

## 2022-02-03 DIAGNOSIS — Z0000 Encounter for general adult medical examination without abnormal findings: Secondary | ICD-10-CM

## 2022-02-03 LAB — CMP14+EGFR
ALT: 14 IU/L (ref 0–32)
AST: 19 IU/L (ref 0–40)
Albumin/Globulin Ratio: 1.8 (ref 1.2–2.2)
Albumin: 4.3 g/dL (ref 3.8–4.8)
Alkaline Phosphatase: 91 IU/L (ref 44–121)
BUN/Creatinine Ratio: 22 (ref 12–28)
BUN: 14 mg/dL (ref 8–27)
Bilirubin Total: 0.5 mg/dL (ref 0.0–1.2)
CO2: 24 mmol/L (ref 20–29)
Calcium: 9.3 mg/dL (ref 8.7–10.3)
Chloride: 102 mmol/L (ref 96–106)
Creatinine, Ser: 0.64 mg/dL (ref 0.57–1.00)
Globulin, Total: 2.4 g/dL (ref 1.5–4.5)
Glucose: 123 mg/dL — ABNORMAL HIGH (ref 70–99)
Potassium: 4.7 mmol/L (ref 3.5–5.2)
Sodium: 139 mmol/L (ref 134–144)
Total Protein: 6.7 g/dL (ref 6.0–8.5)
eGFR: 94 mL/min/{1.73_m2} (ref >59)

## 2022-02-03 LAB — BAYER DCA HB A1C WAIVED: HB A1C (BAYER DCA - WAIVED): 6 % — ABNORMAL HIGH (ref 4.8–5.6)

## 2022-02-03 NOTE — Progress Notes (Signed)
Emily [name] is a 71 y.o. female presents to office today for annual physical exam examination.    Concerns today include: 1. Type 2 Diabetes with hypertension, hyperlipidemia:  Patient is compliant with all of her medications.  No hypoglycemic episodes reported.  She admits that she does not do a great job checking blood sugars regularly but overall has felt relatively well from a diabetic standpoint.  She has chronic urinary frequency at baseline but notes total resolution of the vaginal symptoms since discontinuation of Jardiance.  Last eye exam: Up-to-date Last foot exam: The Last A1c:  Lab Results  Component Value Date   HGBA1C 6.4 (H) 09/30/2021   Nephropathy screen indicated?:  On ARB Last flu, zoster and/or pneumovax:  Immunization History  Administered Date(s) Administered   Fluad Quad(high Dose 65+) 03/24/2016, 02/28/2019, 03/05/2020   Influenza, High Dose Seasonal PF 03/24/2016, 03/22/2018   Influenza, Seasonal, Injecte, Preservative Fre 06/05/2014, 07/02/2015   Influenza,inj,Quad PF,6+ Mos 06/05/2014, 07/02/2015   Influenza-Unspecified 03/24/2016   PFIZER(Purple Top)SARS-COV-2 Vaccination 07/10/2019, 08/03/2019, 03/13/2020   Pneumococcal Conjugate-13 07/09/2015   Pneumococcal Polysaccharide-23 01/30/2014, 02/28/2019   Tdap 05/26/2014    ROS: No pain, shortness of breath, dizziness reported.  She does report some numbness and tingling that occurs in the right thigh and seems to be associated with right low back and hip pain.  This is been ongoing for quite some time now.  She saw Dr. Micheline Chapman many years ago and he offered corticosteroid injection into the spine but she notes that symptoms have resolved and therefore she never pursued it.  She is currently taking Tylenol PM with some improvement in sleep and pain.  Symptoms are only present when she lies down.  She has no issues during the daytime   Occupation: Hairdresser  Diet: Fair, Exercise: No structured Last eye  exam: Up-to-date Last colonoscopy: Up-to-date Last mammogram: Up to date Last pap smear: N/A Refills needed today: None Immunizations needed: Immunization History  Administered Date(s) Administered   Fluad Quad(high Dose 65+) 03/24/2016, 02/28/2019, 03/05/2020   Influenza, High Dose Seasonal PF 03/24/2016, 03/22/2018   Influenza, Seasonal, Injecte, Preservative Fre 06/05/2014, 07/02/2015   Influenza,inj,Quad PF,6+ Mos 06/05/2014, 07/02/2015   Influenza-Unspecified 03/24/2016   PFIZER(Purple Top)SARS-COV-2 Vaccination 07/10/2019, 08/03/2019, 03/13/2020   Pneumococcal Conjugate-13 07/09/2015   Pneumococcal Polysaccharide-23 01/30/2014, 02/28/2019   Tdap 05/26/2014     Past Medical History:  Diagnosis Date   Anemia    Arthritis    Atrial fibrillation, rapid ([place]) 10/27/2013   Bilateral carotid artery disease (HCC)    Diabetes mellitus without complication ([place])    Difficult intubation    states 'lady that did the sleep study told me I have the smallest airway she has ever seen in an adult"   Dyslipidemia 06/29/2017   Dysrhythmia    Hypertension    Hypothyroid    Obesity    Obstructive sleep apnea    on C Pap   PAF (paroxysmal atrial fibrillation) ([place])    a. newly dx in 09/2013; on eliquis   Social History   Socioeconomic History   Marital status: Single    Spouse name: Not on file   Number of children: Not on file   Years of education: Not on file   Highest education level: Not on file  Occupational History   Not on file  Tobacco Use   Smoking status: Never   Smokeless tobacco: Never  Vaping Use   Vaping Use: Never used  Substance and Sexual Activity  Alcohol use: No    Alcohol/week: 0.0 standard drinks of alcohol   Drug use: Never   Sexual activity: Never  Other Topics Concern   Not on file  Social History Narrative   Not on file   Social Determinants of Health   Financial Resource Strain: Not on file  Food Insecurity: Not on file  Transportation  Needs: No Transportation Needs (12/21/2017)   PRAPARE - Hydrologist (Medical): No    Lack of Transportation (Non-Medical): No  Physical Activity: Inactive (12/21/2017)   Exercise Vital Sign    Days of Exercise per Week: 0 days    Minutes of Exercise per Session: 0 min  Stress: No Stress Concern Present (12/21/2017)   [place]    Feeling of Stress : Not at all  Social Connections: Somewhat Isolated (12/21/2017)   Social Connection and Isolation Panel [NHANES]    Frequency of Communication with Friends and Family: Twice a week    Frequency of Social Gatherings with Friends and Family: Twice a week    Attends Religious Services: More than 4 times per year    Active Member of [employer] or Organizations: No    Attends Archivist Meetings: Never    Marital Status: Never married  Intimate Partner Violence: Not At Risk (12/21/2017)   Humiliation, Afraid, Rape, and Kick questionnaire    Fear of Current or Ex-Partner: No    Emotionally Abused: No    Physically Abused: No    Sexually Abused: No   Past Surgical History:  Procedure Laterality Date   CATARACT EXTRACTION Left    CATARACT EXTRACTION W/PHACO Right 12/09/2015   Procedure: CATARACT EXTRACTION PHACO AND INTRAOCULAR LENS PLACEMENT ([place]);  Surgeon: Tonny Branch, MD;  Location: [facility];  Service: Ophthalmology;  Laterality: Right;  CDE:10.48   COLONOSCOPY W/ POLYPECTOMY     cyst on back of neck removed     DILATION AND CURETTAGE OF UTERUS     x 5   ENDARTERECTOMY Left 08/30/2015   Procedure: LEFT CAROYID ENDARTERECTOMY WITH XENOSURE BOVINE PERICARDIUM PATCH ANGIOPLASTY;  Surgeon: [name], MD;  Location: [facility];  Service: Vascular;  Laterality: Left;   EYE SURGERY     TONSILLECTOMY     UPPER GI ENDOSCOPY     Family History  Problem Relation Age of Onset   COPD Father    Heart failure Father    Heart disease Father    Emphysema  Father    Arrhythmia Sister    Arrhythmia Sister    Arrhythmia Sister        had PPM also   Cancer Sister     Current Outpatient Medications:    aspirin EC 81 MG tablet, Take 81 mg by mouth daily., Disp: , Rfl:    diltiazem (CARDIZEM CD) 180 MG 24 hr capsule, TAKE 1 CAPSULE BY MOUTH EVERY DAY, Disp: 90 capsule, Rfl: 2   diltiazem (CARDIZEM) 30 MG tablet, Take 30 mg by mouth as needed., Disp: , Rfl:    ezetimibe (ZETIA) 10 MG tablet, TAKE 1 TABLET BY MOUTH EVERY DAY, Disp: 90 tablet, Rfl: 3   flecainide (TAMBOCOR) 100 MG tablet, TAKE 1 TABLET BY MOUTH TWICE A DAY, Disp: 180 tablet, Rfl: 3   furosemide (LASIX) 20 MG tablet, TAKE 1 TABLET (20 MG TOTAL) BY MOUTH DAILY AS NEEDED FOR FLUID., Disp: 90 tablet, Rfl: 0   glipiZIDE (GLUCOTROL) 5 MG tablet, Take 1 tablet (  5 mg total) by mouth 2 (two) times daily. (NEEDS TO BE SEEN BEFORE NEXT REFILL), Disp: 60 tablet, Rfl: 0   ketoconazole (NIZORAL) 2 % cream, Apply 1 application topically daily. Prn intertrigo rash, Disp: 60 g, Rfl: 1   levothyroxine (SYNTHROID) 112 MCG tablet, TAKE 1 TABLET BY MOUTH EVERY OTHER DAY, ALTERNATING WITH 1 & 1/2 TABLETS EVERY OTHER DAY, Disp: 112 tablet, Rfl: 0   losartan (COZAAR) 25 MG tablet, TAKE 1 TABLET (25 MG TOTAL) BY MOUTH DAILY., Disp: 90 tablet, Rfl: 3   lovastatin (MEVACOR) 40 MG tablet, TAKE 1 TABLET BY MOUTH EVERY DAY IN THE EVENING, Disp: 90 tablet, Rfl: 1   metFORMIN (GLUCOPHAGE) 1000 MG tablet, TAKE 1 TABLET (1,000 MG TOTAL) BY MOUTH TWICE A DAY WITH FOOD, Disp: 180 tablet, Rfl: 0   metoprolol succinate (TOPROL-XL) 100 MG 24 hr tablet, TAKE 1 TABLET BY MOUTH DAILY. TAKE WITH OR IMMEDIATELY FOLLOWING A MEAL., Disp: 90 tablet, Rfl: 1   rOPINIRole (REQUIP) 0.5 MG tablet, Take 1 tablet (0.5 mg total) by mouth at bedtime. (NEEDS TO BE SEEN BEFORE NEXT REFILL), Disp: 30 tablet, Rfl: 0   Semaglutide,0.25 or 0.5MG/DOS, 2 MG/3ML SOPN, Inject 0.25 mg into the skin once a week., Disp: 3 mL, Rfl:    triamcinolone cream  (KENALOG) 0.1 %, Apply 1 Application topically 2 (two) times daily., Disp: 80 g, Rfl: 0   bisacodyl (DULCOLAX) 5 MG EC tablet, Take 5 mg by mouth daily. (Patient not taking: Reported on 02/03/2022), Disp: , Rfl:   Current Facility-Administered Medications:    cyanocobalamin ((VITAMIN B-12)) injection 1,000 mcg, 1,000 mcg, Intramuscular, Q30 days, Timmothy Euler, MD, 1,000 mcg at 12/23/21 2111  Allergies  Allergen Reactions   Jardiance [Empagliflozin] Other (See Comments)    RECURRENT VAGINITIS   Quinapril Hcl Cough   Statins Other (See Comments)    Not all Statins but some cause cough and pain in legs.   Tape Other (See Comments)    Redness, please use "paper" tape     ROS: Review of Systems Pertinent items noted in HPI and remainder of comprehensive ROS otherwise negative.    Physical exam BP (!) 135/57   Pulse (!) 56   Temp (!) 97.4 F (36.3 C)   Ht 5' 3"  (1.6 m)   Wt 220 lb (99.8 kg)   SpO2 96%   BMI 38.97 kg/m  General appearance: alert, cooperative, appears stated age, and no distress Head: Normocephalic, without obvious abnormality, atraumatic Eyes: negative findings: lids and lashes normal, conjunctivae and sclerae normal, corneas clear, and pupils equal, round, reactive to light and accomodation Ears: normal TM's and external ear canals both ears Nose: Nares normal. Septum midline. Mucosa normal. No drainage or sinus tenderness. Throat: lips, mucosa, and tongue normal; teeth and gums normal Neck: no adenopathy, no carotid bruit, no JVD, supple, symmetrical, trachea midline, and thyroid not enlarged, symmetric, no tenderness/mass/nodules Back: symmetric, no curvature. ROM normal. No CVA tenderness. Lungs: clear to auscultation bilaterally Heart: regular rate and rhythm, S1, S2 normal, no murmur, click, rub or gallop Abdomen: soft, non-tender; bowel sounds normal; no masses,  no organomegaly Extremities: extremities normal, atraumatic, no cyanosis or edema Pulses:  2+ and symmetric Skin: Skin color, texture, turgor normal. No rashes or lesions Lymph nodes: Cervical, supraclavicular, and axillary nodes normal. Neurologic: Grossly normal Psych: Mood stable, speech normal, affect appropriate  Diabetic Foot Exam - Simple   Simple Foot Form Diabetic Foot exam was performed with the following findings: Yes 02/03/2022  2:07 PM  Visual Inspection See comments: Yes Sensation Testing Intact to touch and monofilament testing bilaterally: Yes Pulse Check Posterior Tibialis and Dorsalis pulse intact bilaterally: Yes Comments Onychomycotic changes to the nails bilaterally     Assessment/ Plan: Mamie Levers here for annual physical exam.   Annual physical exam  Type 2 diabetes mellitus with hyperlipidemia ([place]) - Plan: CMP14+EGFR, Bayer DCA Hb A1c Waived, Lipid Panel  Hypertension associated with diabetes ([place])  Hyperlipidemia associated with type 2 diabetes mellitus ([place])  Hypothyroidism due to acquired atrophy of thyroid - Plan: TSH, T4, Free  PAF (paroxysmal atrial fibrillation) (H[facility])  Vitamin B12 deficiency - Plan: Vitamin B12  DDD (degenerative disc disease), lumbar  Diabetes remains under excellent control.  Check lipid, CMP.  Blood pressure is controlled.  No changes  Continue statin  Check TSH, free T4  A-fib is a rate controlled  B12 administered  Patient with degenerative disc disease noted on previous plain films back in 2019.  This was primarily affecting the lumbar spine.  I suspect that this is what is causing her symptoms in the right thigh and did offer referral for further evaluation management but she wishes to hold off on this.  I also offered gabapentin but again wishes to hold off on any additional medicines at this time  Emily [name] M. Lajuana Ripple, DO

## 2022-02-05 LAB — LIPID PANEL
Chol/HDL Ratio: 4 ratio (ref 0.0–4.4)
Cholesterol, Total: 153 mg/dL (ref 100–199)
HDL: 38 mg/dL — ABNORMAL LOW (ref >39)
LDL Chol Calc (NIH): 85 mg/dL (ref 0–99)
Triglycerides: 174 mg/dL — ABNORMAL HIGH (ref 0–149)
VLDL Cholesterol Cal: 30 mg/dL (ref 5–40)

## 2022-02-05 LAB — VITAMIN B12: Vitamin B-12: 576 pg/mL (ref 232–1245)

## 2022-02-05 LAB — SPECIMEN STATUS REPORT

## 2022-02-05 LAB — T4, FREE: Free T4: 1.51 ng/dL (ref 0.82–1.77)

## 2022-02-05 LAB — TSH: TSH: 3.07 u[IU]/mL (ref 0.450–4.500)

## 2022-02-19 ENCOUNTER — Telehealth: Payer: Self-pay | Admitting: Family Medicine

## 2022-02-19 DIAGNOSIS — M5136 Other intervertebral disc degeneration, lumbar region: Secondary | ICD-10-CM

## 2022-02-24 NOTE — Addendum Note (Signed)
Addended by: Janora Norlander on: 02/24/2022 02:58 PM   Modules accepted: Orders

## 2022-02-24 NOTE — Telephone Encounter (Signed)
I offered gabapentin for pain at her visit but she declined.  Also offered referral to spinal specialist as she has known DDD of her lumbar spine.  If she would like to pursue either of these avenues, please let me know and I can place orders

## 2022-02-24 NOTE — Telephone Encounter (Signed)
R/C to [employer]

## 2022-02-24 NOTE — Telephone Encounter (Signed)
Referral placed  Orders Placed This Encounter  Procedures   Ambulatory referral to Orthopedic Surgery    Referral Priority:   Routine    Referral Type:   Surgical    Referral Reason:   Specialty Services Required    Requested Specialty:   Orthopedic Surgery    Number of Visits Requested:   1

## 2022-02-24 NOTE — Telephone Encounter (Signed)
Lmtcb.

## 2022-02-24 NOTE — Telephone Encounter (Signed)
LMTCB

## 2022-02-24 NOTE — Telephone Encounter (Signed)
Patient aware and verbalizes understanding.  She states that the pain is manageable but the numbness is no better.  She would like to do whatever you feel is best.  She thought the numbness would have went away by now but it is no better.

## 2022-02-24 NOTE — Telephone Encounter (Signed)
Patient r/c  

## 2022-02-25 ENCOUNTER — Other Ambulatory Visit: Payer: Self-pay | Admitting: Family Medicine

## 2022-02-26 ENCOUNTER — Other Ambulatory Visit: Payer: Self-pay | Admitting: Cardiology

## 2022-03-09 DIAGNOSIS — M5416 Radiculopathy, lumbar region: Secondary | ICD-10-CM | POA: Diagnosis not present

## 2022-03-10 ENCOUNTER — Ambulatory Visit (INDEPENDENT_AMBULATORY_CARE_PROVIDER_SITE_OTHER): Payer: Medicare Other

## 2022-03-10 DIAGNOSIS — E538 Deficiency of other specified B group vitamins: Secondary | ICD-10-CM | POA: Diagnosis not present

## 2022-03-10 NOTE — Progress Notes (Signed)
Cyanocobalamin injection given to right deltoid.  Patient tolerated well. 

## 2022-03-12 ENCOUNTER — Other Ambulatory Visit: Payer: Self-pay | Admitting: Family Medicine

## 2022-03-16 DIAGNOSIS — S0990XA Unspecified injury of head, initial encounter: Secondary | ICD-10-CM | POA: Diagnosis not present

## 2022-03-16 DIAGNOSIS — M549 Dorsalgia, unspecified: Secondary | ICD-10-CM | POA: Diagnosis not present

## 2022-03-16 DIAGNOSIS — M545 Low back pain, unspecified: Secondary | ICD-10-CM | POA: Diagnosis not present

## 2022-03-16 DIAGNOSIS — Z743 Need for continuous supervision: Secondary | ICD-10-CM | POA: Diagnosis not present

## 2022-03-16 DIAGNOSIS — R52 Pain, unspecified: Secondary | ICD-10-CM | POA: Diagnosis not present

## 2022-03-16 DIAGNOSIS — R6889 Other general symptoms and signs: Secondary | ICD-10-CM | POA: Diagnosis not present

## 2022-03-16 DIAGNOSIS — W19XXXA Unspecified fall, initial encounter: Secondary | ICD-10-CM | POA: Diagnosis not present

## 2022-03-17 ENCOUNTER — Encounter (HOSPITAL_COMMUNITY): Payer: Self-pay | Admitting: Emergency Medicine

## 2022-03-17 ENCOUNTER — Ambulatory Visit: Payer: Medicare Other | Admitting: Physical Therapy

## 2022-03-17 ENCOUNTER — Emergency Department (HOSPITAL_COMMUNITY): Payer: Medicare Other

## 2022-03-17 ENCOUNTER — Telehealth: Payer: Self-pay | Admitting: Family Medicine

## 2022-03-17 ENCOUNTER — Emergency Department (HOSPITAL_COMMUNITY)
Admission: EM | Admit: 2022-03-17 | Discharge: 2022-03-17 | Disposition: A | Discharge status: Home / Self Care | Payer: Medicare Other | Attending: Emergency Medicine | Admitting: Emergency Medicine

## 2022-03-17 DIAGNOSIS — M5136 Other intervertebral disc degeneration, lumbar region: Secondary | ICD-10-CM

## 2022-03-17 DIAGNOSIS — E039 Hypothyroidism, unspecified: Secondary | ICD-10-CM | POA: Diagnosis not present

## 2022-03-17 DIAGNOSIS — M47812 Spondylosis without myelopathy or radiculopathy, cervical region: Secondary | ICD-10-CM | POA: Diagnosis not present

## 2022-03-17 DIAGNOSIS — M25551 Pain in right hip: Secondary | ICD-10-CM | POA: Insufficient documentation

## 2022-03-17 DIAGNOSIS — M47816 Spondylosis without myelopathy or radiculopathy, lumbar region: Secondary | ICD-10-CM | POA: Diagnosis not present

## 2022-03-17 DIAGNOSIS — I10 Essential (primary) hypertension: Secondary | ICD-10-CM | POA: Diagnosis not present

## 2022-03-17 DIAGNOSIS — W19XXXA Unspecified fall, initial encounter: Secondary | ICD-10-CM

## 2022-03-17 DIAGNOSIS — I672 Cerebral atherosclerosis: Secondary | ICD-10-CM | POA: Diagnosis not present

## 2022-03-17 DIAGNOSIS — W01198A Fall on same level from slipping, tripping and stumbling with subsequent striking against other object, initial encounter: Secondary | ICD-10-CM | POA: Diagnosis not present

## 2022-03-17 DIAGNOSIS — M545 Low back pain, unspecified: Secondary | ICD-10-CM | POA: Insufficient documentation

## 2022-03-17 DIAGNOSIS — M25552 Pain in left hip: Secondary | ICD-10-CM | POA: Insufficient documentation

## 2022-03-17 DIAGNOSIS — S0083XA Contusion of other part of head, initial encounter: Secondary | ICD-10-CM | POA: Diagnosis not present

## 2022-03-17 DIAGNOSIS — I6529 Occlusion and stenosis of unspecified carotid artery: Secondary | ICD-10-CM | POA: Diagnosis not present

## 2022-03-17 DIAGNOSIS — E119 Type 2 diabetes mellitus without complications: Secondary | ICD-10-CM | POA: Insufficient documentation

## 2022-03-17 DIAGNOSIS — S199XXA Unspecified injury of neck, initial encounter: Secondary | ICD-10-CM | POA: Diagnosis not present

## 2022-03-17 DIAGNOSIS — M79605 Pain in left leg: Secondary | ICD-10-CM | POA: Insufficient documentation

## 2022-03-17 DIAGNOSIS — S0990XA Unspecified injury of head, initial encounter: Secondary | ICD-10-CM | POA: Diagnosis not present

## 2022-03-17 MED ORDER — ACETAMINOPHEN 500 MG PO TABS
1000.0000 mg | ORAL_TABLET | Freq: Once | ORAL | Status: AC
  Administered 2022-03-17: 1000 mg via ORAL
  Filled 2022-03-17: qty 2

## 2022-03-17 MED ORDER — OXYCODONE HCL 5 MG PO TABS
5.0000 mg | ORAL_TABLET | Freq: Once | ORAL | Status: AC
  Administered 2022-03-17: 5 mg via ORAL
  Filled 2022-03-17: qty 1

## 2022-03-17 NOTE — Telephone Encounter (Signed)
PT AWARE TO TRY ICEY HOT AND HEAT. AWARE DR G WILL NOT RX MUSCLE RELAXER DUE TO TAKING METOPROLOL

## 2022-03-17 NOTE — ED Provider Notes (Signed)
[facility] Emergency Department Provider Note MRN:  [id number]  Arrival date & time: 03/17/22     Chief Complaint   Fall History of Present Illness   Emily [name] is a 71 y.o. year-old female with a history of diabetes, A-fib presenting to the ED with chief complaint of fall.  Tripped over something on the ground and fell forward, struck her face on the ground.  Has some bruising to her face, cut to her lip, endorsing low back pain, pain to the left leg in the hip area.  No loss of consciousness.  Review of Systems  A thorough review of systems was obtained and all systems are negative except as noted in the HPI and PMH.   Patient's Health History    Past Medical History:  Diagnosis Date   Anemia    Arthritis    Atrial fibrillation, rapid ([place]) 10/27/2013   Bilateral carotid artery disease (HCC)    Diabetes mellitus without complication ([place])    Difficult intubation    states 'lady that did the sleep study told me I have the smallest airway she has ever seen in an adult"   Dyslipidemia 06/29/2017   Dysrhythmia    Hypertension    Hypothyroid    Obesity    Obstructive sleep apnea    on C Pap   PAF (paroxysmal atrial fibrillation) ([place])    a. newly dx in 09/2013; on eliquis    Past Surgical History:  Procedure Laterality Date   CATARACT EXTRACTION Left    CATARACT EXTRACTION W/PHACO Right 12/09/2015   Procedure: CATARACT EXTRACTION PHACO AND INTRAOCULAR LENS PLACEMENT ([place]);  Surgeon: Tonny Branch, MD;  Location: [facility];  Service: Ophthalmology;  Laterality: Right;  CDE:10.48   COLONOSCOPY W/ POLYPECTOMY     cyst on back of neck removed     DILATION AND CURETTAGE OF UTERUS     x 5   ENDARTERECTOMY Left 08/30/2015   Procedure: LEFT CAROYID ENDARTERECTOMY WITH XENOSURE BOVINE PERICARDIUM PATCH ANGIOPLASTY;  Surgeon: [name], MD;  Location: [facility] OR;  Service: Vascular;  Laterality: Left;   EYE SURGERY     TONSILLECTOMY     UPPER GI ENDOSCOPY       Family History  Problem Relation Age of Onset   COPD Father    Heart failure Father    Heart disease Father    Emphysema Father    Arrhythmia Sister    Arrhythmia Sister    Arrhythmia Sister        had PPM also   Cancer Sister     Social History   Socioeconomic History   Marital status: Single    Spouse name: Not on file   Number of children: Not on file   Years of education: Not on file   Highest education level: Not on file  Occupational History   Not on file  Tobacco Use   Smoking status: Never   Smokeless tobacco: Never  Vaping Use   Vaping Use: Never used  Substance and Sexual Activity   Alcohol use: No    Alcohol/week: 0.0 standard drinks of alcohol   Drug use: Never   Sexual activity: Never  Other Topics Concern   Not on file  Social History Narrative   Not on file   Social Determinants of Health   Financial Resource Strain: Not on file  Food Insecurity: Not on file  Transportation Needs: No Transportation Needs (12/21/2017)   PRAPARE - Transportation    Lack  of Transportation (Medical): No    Lack of Transportation (Non-Medical): No  Physical Activity: Inactive (12/21/2017)   Exercise Vital Sign    Days of Exercise per Week: 0 days    Minutes of Exercise per Session: 0 min  Stress: No Stress Concern Present (12/21/2017)   [place]    Feeling of Stress : Not at all  Social Connections: Somewhat Isolated (12/21/2017)   Social Connection and Isolation Panel [NHANES]    Frequency of Communication with Friends and Family: Twice a week    Frequency of Social Gatherings with Friends and Family: Twice a week    Attends Religious Services: More than 4 times per year    Active Member of [employer] or Organizations: No    Attends Archivist Meetings: Never    Marital Status: Never married  Intimate Partner Violence: Not At Risk (12/21/2017)   Humiliation, Afraid, Rape, and Kick  questionnaire    Fear of Current or Ex-Partner: No    Emotionally Abused: No    Physically Abused: No    Sexually Abused: No     Physical Exam   Vitals:   03/17/22 0230 03/17/22 0250  BP: (!) 150/57   Pulse: 66 67  Resp: 18   SpO2: 96% 95%    CONSTITUTIONAL: Well-appearing, NAD NEURO/PSYCH:  Alert and oriented x 3, normal and symmetric strength and sensation, normal coordination, normal speech EYES:  eyes equal and reactive ENT/NECK:  no LAD, no JVD CARDIO: Regular rate, well-perfused, normal S1 and S2 PULM:  CTAB no wheezing or rhonchi GI/GU:  non-distended, non-tender MSK/SPINE:  No gross deformities, no edema SKIN:  no rash, atraumatic   *Additional and/or pertinent findings included in MDM below  Diagnostic and Interventional Summary    EKG Interpretation  Date/Time:    Ventricular Rate:    PR Interval:    QRS Duration:   QT Interval:    QTC Calculation:   R Axis:     Text Interpretation:         Labs Reviewed - No data to display  DG Lumbar Spine Complete  Final Result    DG HIPS BILAT WITH PELVIS MIN 5 VIEWS  Final Result    CT HEAD WO CONTRAST (5MM)  Final Result    CT CERVICAL SPINE WO CONTRAST  Final Result      Medications  oxyCODONE (Oxy IR/ROXICODONE) immediate release tablet 5 mg (5 mg Oral Given 03/17/22 0238)  acetaminophen (TYLENOL) tablet 1,000 mg (1,000 mg Oral Given 03/17/22 0238)     Procedures  /  Critical Care Procedures  ED Course and Medical Decision Making  Initial Impression and Ddx Mechanical fall, evidence of head trauma, also having some low back pain, bilateral hip pain.  DDx includes intracranial bleeding, cervical spinal fracture, lumbar spinal fracture, hip fracture.  There is no numbness or weakness, no bowel or bladder dysfunction, nothing to suggest myelopathy.  No chest pain or shortness of breath, no abdominal pain, doubt significant intrathoracic or intra-abdominal injury.  Past medical/surgical history that  increases complexity of ED encounter: None  Interpretation of Diagnostics Imaging is reassuring with no signs of acute fracture or intracranial bleeding. Patient Reassessment and Ultimate Disposition/Management     Patient is appropriate for discharge.  Patient management required discussion with the following services or consulting groups:  None  Complexity of Problems Addressed Acute illness or injury that poses threat of life of bodily function  Additional Data Reviewed  and Analyzed Further history obtained from: None  Additional Factors Impacting ED Encounter Risk None  Barth Kirks. [place]onia Small, [place] [email or web address]  Final Clinical Impressions(s) / ED Diagnoses     ICD-10-CM   1. Acute low back pain, unspecified back pain laterality, unspecified whether sciatica present  M54.50     2. Fall, initial encounter  [email or web address].[facility]       ED Discharge Orders     None        Discharge Instructions Discussed with and Provided to Patient:    Discharge Instructions      You were evaluated in the Emergency Department and after careful evaluation, we did not find any emergent condition requiring admission or further testing in the hospital.  Your exam/testing today is overall reassuring.  X-rays and CT scans did not show any significant injuries.  Suspect your pain is related to bruising.  Recommend Tylenol or Motrin at home for discomfort.  Very important that you use her walker and avoid any further falls.  Please return to the Emergency Department if you experience any worsening of your condition.   Thank you for allowing [place] to be a part of your care.      Maudie Flakes, MD 03/17/22 [phone]

## 2022-03-17 NOTE — ED Triage Notes (Signed)
Pt BIB [facility] from home after mechanical fall today at home. Pt refused EMS earlier today. Pt has small lip laceration, 1 of her bottom teeth was knocked out, bruising to left jaw, and lower back pain. Pt has hx of back pain and is scheduled to start PT tomorrow.

## 2022-03-17 NOTE — Telephone Encounter (Signed)
Patient called requesting to speak directly with Almyra Free.

## 2022-03-17 NOTE — Discharge Instructions (Signed)
You were evaluated in the Emergency Department and after careful evaluation, we did not find any emergent condition requiring admission or further testing in the hospital.  Your exam/testing today is overall reassuring.  X-rays and CT scans did not show any significant injuries.  Suspect your pain is related to bruising.  Recommend Tylenol or Motrin at home for discomfort.  Very important that you use her walker and avoid any further falls.  Please return to the Emergency Department if you experience any worsening of your condition.   Thank you for allowing [place] to be a part of your care.

## 2022-03-17 NOTE — Telephone Encounter (Signed)
Pt called requesting to see Dr Lajuana Ripple ASAP. Says she had a fall at home and ended up going to the ER. Says she didn't break anything but is having a lot of pain and wants to be seen about it and to see if Dr Lajuana Ripple can prescribe her a muscle relaxer.   Please advise and call patient.

## 2022-03-17 NOTE — ED Notes (Signed)
Patient transported to CT and X-Ray

## 2022-03-18 ENCOUNTER — Telehealth: Payer: Self-pay | Admitting: Pharmacist

## 2022-03-18 DIAGNOSIS — E1169 Type 2 diabetes mellitus with other specified complication: Secondary | ICD-10-CM

## 2022-03-18 NOTE — Telephone Encounter (Signed)
Lm making pt aware

## 2022-03-18 NOTE — Telephone Encounter (Signed)
Hey!  Do you mind reaching out to patient to see if she needs to schedule with me? I'm slammed this week :)  Thank you!

## 2022-03-18 NOTE — Telephone Encounter (Signed)
Please let patient know: Ozempic 0.'5mg'$  weekly #4 boxes is ready for pick up Via [employer] patient assistance program Please tell patient to schedule with me to re-enroll for patient assistance for 2024 if she would like to continue Pharmacy clinic appt via telephone or inperson is fine (doesn't have to be CCM)

## 2022-03-19 NOTE — Telephone Encounter (Signed)
Pt contacted the office back asking what she can take for pain from her recent fall. She was seen at ER and aware that Dr Darnell Level does not want to prescribe a muscle relaxer. She is taking Tylenol but unable to take IBU because of bleeding. Wants to know if there is anything else that you suggest she take.

## 2022-03-19 NOTE — Telephone Encounter (Signed)
Pt called requesting to speak directly with nurse regarding her pain.

## 2022-03-19 NOTE — Addendum Note (Signed)
Addended by: Lottie Dawson D on: 03/19/2022 08:22 AM   Modules accepted: Orders

## 2022-03-19 NOTE — Telephone Encounter (Signed)
New CCM referral order to continue CCM pharmacy services on 03/19/2022

## 2022-03-20 MED ORDER — METHOCARBAMOL 500 MG PO TABS: 500.0000 mg | ORAL_TABLET | Freq: Three times a day (TID) | ORAL | 0 refills | Status: DC | PRN

## 2022-03-20 NOTE — Telephone Encounter (Signed)
LM MAKING PT AWARE 

## 2022-03-20 NOTE — Telephone Encounter (Signed)
I've sent robaxin.  Hopefully, we'll have no issues with her afib but she is extremely limited to meds otherwise.  If insufficient and is needing pain meds, will have to be seen in person since all of those are controlled.

## 2022-03-20 NOTE — Addendum Note (Signed)
Addended by: Janora Norlander on: 03/20/2022 11:49 AM   Modules accepted: Orders

## 2022-03-23 ENCOUNTER — Encounter: Payer: Self-pay | Admitting: Family Medicine

## 2022-03-23 ENCOUNTER — Ambulatory Visit: Payer: Medicare Other | Admitting: Family Medicine

## 2022-03-23 VITALS — BP 122/58 | HR 66 | Temp 97.0°F | Ht 63.0 in | Wt 218.6 lb

## 2022-03-23 DIAGNOSIS — S39012A Strain of muscle, fascia and tendon of lower back, initial encounter: Secondary | ICD-10-CM | POA: Diagnosis not present

## 2022-03-23 MED ORDER — HYDROCODONE-ACETAMINOPHEN 5-325 MG PO TABS: 1.0000 | ORAL_TABLET | Freq: Two times a day (BID) | ORAL | 0 refills | Status: AC | PRN

## 2022-03-23 MED ORDER — PREDNISONE 20 MG PO TABS: ORAL_TABLET | ORAL | 0 refills | Status: DC

## 2022-03-23 NOTE — Progress Notes (Signed)
BP (!) 122/58   Pulse 66   Temp (!) 97 F (36.1 C) (Temporal)   Ht '5\' 3"'$  (1.6 m)   Wt 218 lb 9.6 oz (99.2 kg)   SpO2 98%   BMI 38.72 kg/m    Subjective:   Patient ID: Emily [name], female    DOB: May 02, 1951, 71 y.o.   MRN: [id number]  HPI: Emily [name] is a 71 y.o. female presenting on 03/23/2022 for ER follow up (Fall 03/17/22 [facility]- fall.  Patient states that she has lower back muscle spasms that is worse at night.  )   HPI Back pain and fall Patient is coming in for back pain after a fall.  She says that she had a fall on 03/17/2022.  She was working in a [employer] and [employer] and there was a lip on one of the padded mats and somehow she caught her foot there and fell down.  She landed on the left side of her face on a cabinet and then twisted and landed on the left hip on the outside and ended up with lower back pain and hip pain and face pain.  She says her hip and her face are doing better but it is mainly her lower back on both sides that is hurting the worst.  It hurts her at night and sitting and laying down and she is having trouble with it in many positions.  She was given a muscle relaxer by her PCP and says it helped some but cannot take it too much because it is sedating.  She is also been having some constipation and is using MiraLAX and a laxative.  Relevant past medical, surgical, family and social history reviewed and updated as indicated. Interim medical history since our last visit reviewed. Allergies and medications reviewed and updated.  Review of Systems  Constitutional:  Negative for chills and fever.  HENT:  Negative for congestion, ear discharge and ear pain.   Eyes:  Negative for redness and visual disturbance.  Respiratory:  Negative for chest tightness and shortness of breath.   Cardiovascular:  Negative for chest pain and leg swelling.  Genitourinary:  Negative for difficulty urinating and dysuria.  Musculoskeletal:  Positive for arthralgias, back pain and  myalgias. Negative for gait problem.  Skin:  Negative for rash.  Neurological:  Negative for light-headedness and headaches.  Psychiatric/Behavioral:  Negative for agitation and behavioral problems.   All other systems reviewed and are negative.   Per HPI unless specifically indicated above   Allergies as of 03/23/2022       Reactions   Jardiance [empagliflozin] Other (See Comments)   RECURRENT VAGINITIS   Quinapril Hcl Cough   Statins Other (See Comments)   Not all Statins but some cause cough and pain in legs.   Tape Other (See Comments)   Redness, please use "paper" tape        Medication List        Accurate as of March 23, 2022  3:18 PM. If you have any questions, ask your nurse or doctor.          aspirin EC 81 MG tablet Take 81 mg by mouth daily.   bisacodyl 5 MG EC tablet Commonly known as: DULCOLAX Take 5 mg by mouth daily.   diltiazem 180 MG 24 hr capsule Commonly known as: CARDIZEM CD TAKE 1 CAPSULE BY MOUTH EVERY DAY   diltiazem 30 MG tablet Commonly known as: CARDIZEM Take 30 mg by mouth as needed.  ezetimibe 10 MG tablet Commonly known as: ZETIA TAKE 1 TABLET BY MOUTH EVERY DAY   flecainide 100 MG tablet Commonly known as: TAMBOCOR TAKE 1 TABLET BY MOUTH TWICE A DAY   furosemide 20 MG tablet Commonly known as: LASIX TAKE 1 TABLET (20 MG TOTAL) BY MOUTH DAILY AS NEEDED FOR FLUID.   glipiZIDE 5 MG tablet Commonly known as: GLUCOTROL Take 1 tablet (5 mg total) by mouth 2 (two) times daily.   HYDROcodone-acetaminophen 5-325 MG tablet Commonly known as: NORCO/VICODIN Take 1 tablet by mouth 2 (two) times daily as needed for up to 5 days for moderate pain. Started by: Fransisca Kaufmann Lakiesha Ralphs, MD   ketoconazole 2 % cream Commonly known as: NIZORAL Apply 1 application topically daily. Prn intertrigo rash   levothyroxine 112 MCG tablet Commonly known as: SYNTHROID TAKE 1 TABLET BY MOUTH EVERY OTHER DAY, ALTERNATING WITH 1 & 1/2 TABLETS  EVERY OTHER DAY   losartan 25 MG tablet Commonly known as: COZAAR TAKE 1 TABLET (25 MG TOTAL) BY MOUTH DAILY.   lovastatin 40 MG tablet Commonly known as: MEVACOR TAKE 1 TABLET BY MOUTH EVERY DAY IN THE EVENING   metFORMIN 1000 MG tablet Commonly known as: GLUCOPHAGE TAKE 1 TABLET (1,000 MG TOTAL) BY MOUTH TWICE A DAY WITH FOOD   methocarbamol 500 MG tablet Commonly known as: ROBAXIN Take 1 tablet (500 mg total) by mouth every 8 (eight) hours as needed for muscle spasms.   metoprolol succinate 100 MG 24 hr tablet Commonly known as: TOPROL-XL TAKE 1 TABLET BY MOUTH DAILY. TAKE WITH OR IMMEDIATELY FOLLOWING A MEAL.   predniSONE 20 MG tablet Commonly known as: DELTASONE 2 po at same time daily for 5 days Started by: Fransisca Kaufmann Jere Bostrom, MD   rOPINIRole 0.5 MG tablet Commonly known as: REQUIP Take 1 tablet (0.5 mg total) by mouth at bedtime.   Semaglutide(0.25 or 0.'5MG'$ /DOS) 2 MG/3ML Sopn Inject 0.25 mg into the skin once a week.   triamcinolone cream 0.1 % Commonly known as: KENALOG Apply 1 Application topically 2 (two) times daily.         Objective:   BP (!) 122/58   Pulse 66   Temp (!) 97 F (36.1 C) (Temporal)   Ht '5\' 3"'$  (1.6 m)   Wt 218 lb 9.6 oz (99.2 kg)   SpO2 98%   BMI 38.72 kg/m   Wt Readings from Last 3 Encounters:  03/23/22 218 lb 9.6 oz (99.2 kg)  03/17/22 220 lb 0.3 oz (99.8 kg)  02/03/22 220 lb (99.8 kg)    Physical Exam Vitals and nursing note reviewed.  Constitutional:      Appearance: Normal appearance.  HENT:     Head:     Comments: Left lower face bruising on chin. Musculoskeletal:     Lumbar back: Spasms and tenderness present. No swelling or bony tenderness. Normal range of motion. Negative right straight leg raise test and negative left straight leg raise test.  Neurological:     Mental Status: She is alert.       Assessment & Plan:   Problem List Items Addressed This Visit   None Visit Diagnoses     Back strain,  initial encounter    -  Primary   Relevant Medications   predniSONE (DELTASONE) 20 MG tablet   HYDROcodone-acetaminophen (NORCO/VICODIN) 5-325 MG tablet       We will give short course of hydrocodone and some steroids to help calm this down.  Movement and stretching and walking  could help relieve some of the pressure as well Follow up plan: Return if symptoms worsen or fail to improve.  Counseling provided for all of the vaccine components No orders of the defined types were placed in this encounter.   Caryl Pina, MD [facility] 03/23/2022, 3:18 PM

## 2022-03-24 ENCOUNTER — Ambulatory Visit: Payer: Medicare Other | Admitting: Physical Therapy

## 2022-03-26 ENCOUNTER — Other Ambulatory Visit: Payer: Self-pay | Admitting: Family Medicine

## 2022-04-01 ENCOUNTER — Telehealth: Payer: Self-pay

## 2022-04-01 NOTE — Chronic Care Management (AMB) (Signed)
  Chronic Care Management   Note  04/01/2022 Name: Emily [name] MRN: [id number] DOB: 06-30-1950  Emily [name] is a 71 y.o. year old female who is a primary care patient of Janora Norlander, DO. I reached out to [employer] by phone today in response to a referral sent by Emily [name]'s PCP.  Emily [name] was given information about Chronic Care Management services today including:  CCM service includes personalized support from designated clinical staff supervised by her physician, including individualized plan of care and coordination with other care providers 24/7 contact phone numbers for assistance for urgent and routine care needs. Service will only be billed when office clinical staff spend 20 minutes or more in a month to coordinate care. Only one practitioner may furnish and bill the service in a calendar month. The patient may stop CCM services at any time (effective at the end of the month) by phone call to the office staff. The patient is responsible for co-pay (up to 20% after annual deductible is met) if co-pay is required by the individual health plan.   Patient agreed to services and verbal consent obtained.   Follow up plan: Telephone appointment with care management team member scheduled for:04/14/2022  Noreene Larsson, [place], [place] [address] Direct Dial: [phone] [email or web address]

## 2022-04-07 ENCOUNTER — Ambulatory Visit (INDEPENDENT_AMBULATORY_CARE_PROVIDER_SITE_OTHER): Payer: Medicare Other | Admitting: *Deleted

## 2022-04-07 DIAGNOSIS — E538 Deficiency of other specified B group vitamins: Secondary | ICD-10-CM

## 2022-04-09 ENCOUNTER — Other Ambulatory Visit: Payer: Self-pay | Admitting: Family Medicine

## 2022-04-14 ENCOUNTER — Ambulatory Visit: Payer: Medicare Other | Admitting: Physical Therapy

## 2022-04-14 ENCOUNTER — Telehealth: Payer: Medicare Other

## 2022-04-21 ENCOUNTER — Other Ambulatory Visit: Payer: Self-pay

## 2022-04-21 ENCOUNTER — Ambulatory Visit: Payer: Medicare Other | Attending: Family Medicine

## 2022-04-21 ENCOUNTER — Ambulatory Visit (INDEPENDENT_AMBULATORY_CARE_PROVIDER_SITE_OTHER): Payer: Medicare Other | Admitting: *Deleted

## 2022-04-21 DIAGNOSIS — M6281 Muscle weakness (generalized): Secondary | ICD-10-CM | POA: Diagnosis not present

## 2022-04-21 DIAGNOSIS — Z9181 History of falling: Secondary | ICD-10-CM | POA: Diagnosis not present

## 2022-04-21 DIAGNOSIS — M79651 Pain in right thigh: Secondary | ICD-10-CM | POA: Insufficient documentation

## 2022-04-21 DIAGNOSIS — E1169 Type 2 diabetes mellitus with other specified complication: Secondary | ICD-10-CM

## 2022-04-21 DIAGNOSIS — I152 Hypertension secondary to endocrine disorders: Secondary | ICD-10-CM

## 2022-04-21 NOTE — Plan of Care (Signed)
Chronic Care Management Provider Comprehensive Care Plan    04/21/2022 Name: Emily [name] MRN: [id number] DOB: 25-Mar-1951  Referral to Chronic Care Management (CCM) services was placed by Provider:  Ronnie Doss DO on Date: 03/19/22.  Chronic Condition 1: HYPERTENSION Provider Assessment and Plan Blood pressure is controlled.  No changes    Expected Outcome/Goals Addressed This Visit (Provider CCM goals/Provider Assessment and plan  CCM (HYPERTENSION) EXPECTED OUTCOME: MONITOR, SELF-MANAGE AND REDUCE SYMPTOMS OF HYPERTENSION  Symptom Management Condition 1: Take medications as prescribed   Attend all scheduled provider appointments Call pharmacy for medication refills 3-7 days in advance of running out of medications Attend church or other social activities Perform all self care activities independently  Perform IADL's (shopping, preparing meals, housekeeping, managing finances) independently Call provider office for new concerns or questions  check blood pressure weekly choose a place to take my blood pressure (home, clinic or office, retail store) write blood pressure results in a log or diary keep a blood pressure log take blood pressure log to all doctor appointments call doctor for signs and symptoms of high blood pressure take medications for blood pressure exactly as prescribed begin an exercise program eat more whole grains, fruits and vegetables, lean meats and healthy fats Look over education mailed- low sodium diet Advanced directives packet mailed fall prevention strategies: change position slowly, use assistive device such as walker or cane (per provider recommendations) when walking, keep walkways clear, have good lighting in room. It is important to contact your provider if you have any falls, maintain muscle strength/tone by exercise per provider recommendations.  Chronic Condition 2: DIABETES Provider Assessment and Plan Diabetes remains under excellent  control.  Check lipid, CMP.    Expected Outcome/Goals Addressed This Visit (Provider CCM goals/Provider Assessment and plan  CCM (DIABETES) EXPECTED OUTCOME:  MONITOR, SELF-MANAGE AND REDUCE SYMPTOMS OF DIABETES  Symptom Management Condition 2: Take medications as prescribed   Attend all scheduled provider appointments Call pharmacy for medication refills 3-7 days in advance of running out of medications Attend church or other social activities Perform all self care activities independently  Perform IADL's (shopping, preparing meals, housekeeping, managing finances) independently Call provider office for new concerns or questions  check blood sugar at prescribed times: once daily check feet daily for cuts, sores or redness enter blood sugar readings and medication or insulin into daily log take the blood sugar log to all doctor visits take the blood sugar meter to all doctor visits trim toenails straight across drink 6 to 8 glasses of water each day fill half of plate with vegetables limit fast food meals to no more than 1 per week read food labels for fat, fiber, carbohydrates and portion size wash and dry feet carefully every day wear comfortable, cotton socks Look over education mailed - hypoglycemia  Problem List Patient Active Problem List   Diagnosis Date Noted   Acute lower UTI    Atrial fibrillation with RVR ([place]) 08/30/2020   Chest pain 08/30/2020   Nonrheumatic aortic valve stenosis 03/19/2020   Persistent atrial fibrillation ([place]) 01/16/2020   Bilateral carotid artery stenosis 10/12/2019   Educated about COVID-19 virus infection 10/12/2019   Acute on chronic blood loss anemia 09/25/2019   OSA on CPAP 09/25/2019   Upper GI bleed 09/25/2019   History of adenomatous polyp of colon 08/30/2019   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 device 07/16/2018   Chronic anticoagulation 03/03/2018   Melena 03/03/2018   Restless leg syndrome 02/16/2018  Chronic  left-sided low back pain with left-sided sciatica 02/16/2018   Morbid obesity ([place]) 02/14/2018   Bruit 02/14/2018   PAF (paroxysmal atrial fibrillation) ([place]) 02/14/2018   DDD (degenerative disc disease), lumbar 12/07/2017   Peripheral arterial disease ([place]) 07/06/2017   Type 2 diabetes mellitus with hyperlipidemia ([place]) 06/29/2017   Iron deficiency anemia 05/27/2017   Vitamin B12 deficiency 05/27/2017   Pain in joint, ankle and foot 06/03/2016   Carotid stenosis 08/30/2015   Bilateral carotid artery disease ([place]) 07/05/2015   Acute bronchitis 01/31/2015   Multiple gastric polyps 07/03/2014   Diabetes mellitus without complication ([place])    Hypertension associated with diabetes ([place]) 10/27/2013   Hypothyroidism due to acquired atrophy of thyroid 10/27/2013   Hypothyroidism 06/06/2010   Nonspecific elevation of levels of transaminase or lactic acid dehydrogenase (LDH) 02/14/2007   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[place]) 02/14/2007   Cellulitis and abscess of finger, unspecified 01/17/2007    Medication Management  Current Outpatient Medications:    aspirin EC 81 MG tablet, Take 81 mg by mouth daily., Disp: , Rfl:    bisacodyl (DULCOLAX) 5 MG EC tablet, Take 5 mg by mouth daily., Disp: , Rfl:    diltiazem (CARDIZEM CD) 180 MG 24 hr capsule, TAKE 1 CAPSULE BY MOUTH EVERY DAY, Disp: 90 capsule, Rfl: 2   diltiazem (CARDIZEM) 30 MG tablet, Take 30 mg by mouth as needed., Disp: , Rfl:    ezetimibe (ZETIA) 10 MG tablet, TAKE 1 TABLET BY MOUTH EVERY DAY, Disp: 90 tablet, Rfl: 3   flecainide (TAMBOCOR) 100 MG tablet, TAKE 1 TABLET BY MOUTH TWICE A DAY, Disp: 180 tablet, Rfl: 3   furosemide (LASIX) 20 MG tablet, TAKE 1 TABLET (20 MG TOTAL) BY MOUTH DAILY AS NEEDED FOR FLUID., Disp: 90 tablet, Rfl: 0   glipiZIDE (GLUCOTROL) 5 MG tablet, Take 1 tablet (5 mg total) by mouth 2 (two) times daily., Disp: 180 tablet, Rfl: 1   ketoconazole (NIZORAL) 2 % cream, Apply 1 application topically daily. Prn  intertrigo rash, Disp: 60 g, Rfl: 1   levothyroxine (SYNTHROID) 112 MCG tablet, TAKE 1 TABLET BY MOUTH EVERY OTHER DAY, ALTERNATING WITH 1 & 1/2 TABLETS EVERY OTHER DAY, Disp: 112 tablet, Rfl: 3   losartan (COZAAR) 25 MG tablet, TAKE 1 TABLET (25 MG TOTAL) BY MOUTH DAILY., Disp: 90 tablet, Rfl: 3   lovastatin (MEVACOR) 40 MG tablet, TAKE 1 TABLET BY MOUTH EVERY DAY IN THE EVENING, Disp: 90 tablet, Rfl: 1   metFORMIN (GLUCOPHAGE) 1000 MG tablet, TAKE 1 TABLET (1,000 MG TOTAL) BY MOUTH TWICE A DAY WITH FOOD, Disp: 180 tablet, Rfl: 0   methocarbamol (ROBAXIN) 500 MG tablet, Take 1 tablet (500 mg total) by mouth every 8 (eight) hours as needed for muscle spasms., Disp: 30 tablet, Rfl: 0   metoprolol succinate (TOPROL-XL) 100 MG 24 hr tablet, TAKE 1 TABLET BY MOUTH EVERY DAY WITH OR IMMEDIATELY FOLLOWING A MEAL, Disp: 90 tablet, Rfl: 0   rOPINIRole (REQUIP) 0.5 MG tablet, Take 1 tablet (0.5 mg total) by mouth at bedtime., Disp: 90 tablet, Rfl: 1   Semaglutide,0.25 or 0.'5MG'$ /DOS, 2 MG/3ML SOPN, Inject 0.25 mg into the skin once a week., Disp: 3 mL, Rfl:    predniSONE (DELTASONE) 20 MG tablet, 2 po at same time daily for 5 days (Patient not taking: Reported on 04/21/2022), Disp: 10 tablet, Rfl: 0   triamcinolone cream (KENALOG) 0.1 %, Apply 1 Application topically 2 (two) times daily. (Patient not taking: Reported on 04/21/2022), Disp: 80  g, Rfl: 0  Current Facility-Administered Medications:    cyanocobalamin ((VITAMIN B-12)) injection 1,000 mcg, 1,000 mcg, Intramuscular, Q30 days, Timmothy Euler, MD, 1,000 mcg at 04/07/22 1003  Cognitive Assessment Identity Confirmed: : Name; DOB Cognitive Status: Normal   Functional Assessment Hearing Difficulty or Deaf: no Wear Glasses or Blind: yes Vision Management: reading glasses Concentrating, Remembering or Making Decisions Difficulty (CP): no Difficulty Communicating: no Difficulty Eating/Swallowing: no Walking or Climbing Stairs Difficulty:  no Dressing/Bathing Difficulty: no Doing Errands Independently Difficulty (such as shopping) (CP): no   Caregiver Assessment  Primary Source of Support/Comfort: extended family Name of Support/Comfort Primary Source: can call on friends and nephew Imelda Pillow People in Home: alone   Planned Interventions  Evaluation of current treatment plan related to hypertension self management and patient's adherence to plan as established by provider;   Provided education to patient re: stroke prevention, s/s of heart attack and stroke; Reviewed prescribed diet low sodium Reviewed medications with patient and discussed importance of compliance;  Counseled on the importance of exercise goals with target of 150 minutes per week Discussed plans with patient for ongoing care management follow up and provided patient with direct contact information for care management team; Advised patient, providing education and rationale, to monitor blood pressure daily and record, calling PCP for findings outside established parameters;  Discussed complications of poorly controlled blood pressure such as heart disease, stroke, circulatory complications, vision complications, kidney impairment, sexual dysfunction;  Screening for signs and symptoms of depression related to chronic disease state;  Assessed social determinant of health barriers;  Education mailed - low sodium diet Safety precautions reviewed Provided education to patient about basic DM disease process; Reviewed medications with patient and discussed importance of medication adherence;        Reviewed prescribed diet with patient carbohydrate modified, plate method; Counseled on importance of regular laboratory monitoring as prescribed;        Discussed plans with patient for ongoing care management follow up and provided patient with direct contact information for care management team;      Provided patient with written educational materials related  to hypo and hyperglycemia and importance of correct treatment;       Review of patient status, including review of consultants reports, relevant laboratory and other test results, and medications completed;       Screening for signs and symptoms of depression related to chronic disease state;        Assessed social determinant of health barriers;        Reviewed importance of exercise and benefit with blood sugar levels  Interaction and coordination with outside resources, practitioners, and providers See CCM Referral  Care Plan: Printed and mailed to patient

## 2022-04-21 NOTE — Patient Instructions (Signed)
Please call the care guide team at [phone] if you need to cancel or reschedule your appointment.   If you are experiencing a Mental Health or [place] or need someone to talk to, please call the Suicide and Crisis Lifeline: 988 call the [place] National Suicide Prevention Lifeline: (629) 502-5892 or TTY: 262-327-6359 TTY (763)782-2679) to talk to a trained counselor call 1-800-273-TALK (toll free, 24 hour hotline) go to [facility] [place] [phone]) call the [place]: [phone] call 911   Following is a copy of your full provider care plan:   Goals Addressed             This Visit's Progress    CCM (DIABETES) EXPECTED OUTCOME:  MONITOR, SELF-MANAGE AND REDUCE SYMPTOMS OF DIABETES       Current Barriers:  Knowledge Deficits related to Diabetes management Chronic Disease Management support and education needs related to Diabetes and diet No Advanced Directives in place- information to be mailed per pt request Patient reports she does not check CBG, has glucometer and knows how to use, states AIC is good and does not feel the need to check Patient reports she does not always follow a special diet, drinks mostly unsweetened tea, diet drinks and water Patient states she does not exercise, does continue to work out of her home  Planned Interventions: Provided education to patient about basic DM disease process; Reviewed medications with patient and discussed importance of medication adherence;        Reviewed prescribed diet with patient carbohydrate modified, plate method; Counseled on importance of regular laboratory monitoring as prescribed;        Discussed plans with patient for ongoing care management follow up and provided patient with direct contact information for care management team;      Provided patient with written educational materials related to hypo and hyperglycemia and  importance of correct treatment;       Review of patient status, including review of consultants reports, relevant laboratory and other test results, and medications completed;       Screening for signs and symptoms of depression related to chronic disease state;        Assessed social determinant of health barriers;        Reviewed importance of exercise and benefit with blood sugar levels  Symptom Management: Take medications as prescribed   Attend all scheduled provider appointments Call pharmacy for medication refills 3-7 days in advance of running out of medications Attend church or other social activities Perform all self care activities independently  Perform IADL's (shopping, preparing meals, housekeeping, managing finances) independently Call provider office for new concerns or questions  check blood sugar at prescribed times: once daily check feet daily for cuts, sores or redness enter blood sugar readings and medication or insulin into daily log take the blood sugar log to all doctor visits take the blood sugar meter to all doctor visits trim toenails straight across drink 6 to 8 glasses of water each day fill half of plate with vegetables limit fast food meals to no more than 1 per week read food labels for fat, fiber, carbohydrates and portion size wash and dry feet carefully every day wear comfortable, cotton socks Look over education mailed - hypoglycemia  Follow Up Plan: Telephone follow up appointment with care management team member scheduled for:  06/16/22 at 9 am       CCM (HYPERTENSION) EXPECTED OUTCOME: MONITOR, SELF-MANAGE AND REDUCE SYMPTOMS  OF HYPERTENSION       Current Barriers:  Knowledge Deficits related to Hypertension management Chronic Disease Management support and education needs related to Hypertension, diet, exercise No Advanced Directives in place- pt requests information be mailed Patient reports she lives alone, is independent with all aspects  of her care, 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   Patient is currently attending outpatient PT for lower back/ spine issues, this is 5-6 sessions and if does not help pt states she will be most likely be having an MRI.  Planned Interventions: Evaluation of current treatment plan related to hypertension self management and patient's adherence to plan as established by provider;   Provided education to patient re: stroke prevention, s/s of heart attack and stroke; Reviewed prescribed diet low sodium Reviewed medications with patient and discussed importance of compliance;  Counseled on the importance of exercise goals with target of 150 minutes per week Discussed plans with patient for ongoing care management follow up and provided patient with direct contact information for care management team; Advised patient, providing education and rationale, to monitor blood pressure daily and record, calling PCP for findings outside established parameters;  Discussed complications of poorly controlled blood pressure such as heart disease, stroke, circulatory complications, vision complications, kidney impairment, sexual dysfunction;  Screening for signs and symptoms of depression related to chronic disease state;  Assessed social determinant of health barriers;  Education mailed - low sodium diet Safety precautions reviewed  Symptom Management: Take medications as prescribed   Attend all scheduled provider appointments Call pharmacy for medication refills 3-7 days in advance of running out of medications Attend church or other social activities Perform all self care activities  independently  Perform IADL's (shopping, preparing meals, housekeeping, managing finances) independently Call provider office for new concerns or questions  check blood pressure weekly choose a place to take my blood pressure (home, clinic or office, retail store) write blood pressure results in a log or diary keep a blood pressure log take blood pressure log to all doctor appointments call doctor for signs and symptoms of high blood pressure take medications for blood pressure exactly as prescribed begin an exercise program eat more whole grains, fruits and vegetables, lean meats and healthy fats Look over education mailed- low sodium diet Advanced directives packet mailed fall prevention strategies: change position slowly, use assistive device such as walker or cane (per provider recommendations) when walking, keep walkways clear, have good lighting in room. It is important to contact your provider if you have any falls, maintain muscle strength/tone by exercise per provider recommendations.  Follow Up Plan: Telephone follow up appointment with care management team member scheduled for:  06/16/22 at 9 am          The patient verbalized understanding of instructions, educational materials, and care plan provided today and agreed to receive a mailed copy of patient instructions, educational materials, and care plan.   Telephone follow up appointment with care management team member scheduled for:  06/16/22 at 9 am  Low-Sodium Eating Plan Sodium, which is an element that makes up salt, helps you maintain a healthy  balance of fluids in your body. Too much sodium can increase your blood pressure and cause fluid and waste to be held in your body. Your health care provider or dietitian may recommend following this plan if you have high blood pressure (hypertension), kidney disease, liver disease, or heart failure. Eating less sodium can help lower your blood pressure, reduce swelling, and  protect your heart, liver, and kidneys. What are tips for following this plan? Reading food labels The Nutrition Facts label lists the amount of sodium in one serving of the food. If you eat more than one serving, you must multiply the listed amount of sodium by the number of servings. Choose foods with less than 140 mg of sodium per serving. Avoid foods with 300 mg of sodium or more per serving. Shopping  Look for lower-sodium products, often labeled as "low-sodium" or "no salt added." Always check the sodium content, even if foods are labeled as "unsalted" or "no salt added." Buy fresh foods. Avoid canned foods and pre-made or frozen meals. Avoid canned, cured, or processed meats. Buy breads that have less than 80 mg of sodium per slice. Cooking  Eat more home-cooked food and less restaurant, buffet, and fast food. Avoid adding salt when cooking. Use salt-free seasonings or herbs instead of table salt or sea salt. Check with your health care provider or pharmacist before using salt substitutes. Cook with plant-based oils, such as canola, sunflower, or olive oil. Meal planning When eating at a restaurant, ask that your food be prepared with less salt or no salt, if possible. Avoid dishes labeled as brined, pickled, cured, smoked, or made with soy sauce, miso, or teriyaki sauce. Avoid foods that contain MSG (monosodium glutamate). MSG is sometimes added to [place] food, bouillon, and some canned foods. Make meals that can be grilled, baked, poached, roasted, or steamed. These are generally made with less sodium. General information Most people on this plan should limit their sodium intake to 1,500-2,000 mg (milligrams) of sodium each day. What foods should I eat? Fruits Fresh, frozen, or canned fruit. Fruit juice. Vegetables Fresh or frozen vegetables. "No salt added" canned vegetables. "No salt added" tomato sauce and paste. Low-sodium or reduced-sodium tomato and vegetable  juice. Grains Low-sodium cereals, including oats, puffed wheat and rice, and shredded wheat. Low-sodium crackers. Unsalted rice. Unsalted pasta. Low-sodium bread. Whole-grain breads and whole-grain pasta. Meats and other proteins Fresh or frozen (no salt added) meat, poultry, seafood, and fish. Low-sodium canned tuna and salmon. Unsalted nuts. Dried peas, beans, and lentils without added salt. Unsalted canned beans. Eggs. Unsalted nut butters. Dairy Milk. Soy milk. Cheese that is naturally low in sodium, such as ricotta cheese, fresh mozzarella, or Swiss cheese. Low-sodium or reduced-sodium cheese. Cream cheese. Yogurt. Seasonings and condiments Fresh and dried herbs and spices. Salt-free seasonings. Low-sodium mustard and ketchup. Sodium-free salad dressing. Sodium-free light mayonnaise. Fresh or refrigerated horseradish. Lemon juice. Vinegar. Other foods Homemade, reduced-sodium, or low-sodium soups. Unsalted popcorn and pretzels. Low-salt or salt-free chips. The items listed above may not be a complete list of foods and beverages you can eat. Contact a dietitian for more information. What foods should I avoid? Vegetables Sauerkraut, pickled vegetables, and relishes. Olives. [place] fries. Onion rings. Regular canned vegetables (not low-sodium or reduced-sodium). Regular canned tomato sauce and paste (not low-sodium or reduced-sodium). Regular tomato and vegetable juice (not low-sodium or reduced-sodium). Frozen vegetables in sauces. Grains Instant hot cereals. Bread stuffing, pancake, and biscuit mixes. Croutons. Seasoned rice or pasta mixes. Noodle soup  cups. Boxed or frozen macaroni and cheese. Regular salted crackers. Self-rising flour. Meats and other proteins Meat or fish that is salted, canned, smoked, spiced, or pickled. Precooked or cured meat, such as sausages or meat loaves. [place] Salines. Ham. Pepperoni. Hot dogs. Corned beef. Chipped beef. Salt pork. Jerky. Pickled herring. Anchovies and  sardines. Regular canned tuna. Salted nuts. Dairy Processed cheese and cheese spreads. Hard cheeses. Cheese curds. Blue cheese. Feta cheese. String cheese. Regular cottage cheese. Buttermilk. Canned milk. Fats and oils Salted butter. Regular margarine. Ghee. Bacon fat. Seasonings and condiments Onion salt, garlic salt, seasoned salt, table salt, and sea salt. Canned and packaged gravies. Worcestershire sauce. Tartar sauce. Barbecue sauce. Teriyaki sauce. Soy sauce, including reduced-sodium. Steak sauce. Fish sauce. Oyster sauce. Cocktail sauce. Horseradish that you find on the shelf. Regular ketchup and mustard. Meat flavorings and tenderizers. Bouillon cubes. Hot sauce. Pre-made or packaged marinades. Pre-made or packaged taco seasonings. Relishes. Regular salad dressings. Salsa. Other foods Salted popcorn and pretzels. Corn chips and puffs. Potato and tortilla chips. Canned or dried soups. Pizza. Frozen entrees and pot pies. The items listed above may not be a complete list of foods and beverages you should avoid. Contact a dietitian for more information. Summary Eating less sodium can help lower your blood pressure, reduce swelling, and protect your heart, liver, and kidneys. Most people on this plan should limit their sodium intake to 1,500-2,000 mg (milligrams) of sodium each day. Canned, boxed, and frozen foods are high in sodium. Restaurant foods, fast foods, and pizza are also very high in sodium. You also get sodium by adding salt to food. Try to cook at home, eat more fresh fruits and vegetables, and eat less fast food and canned, processed, or prepared foods. This information is not intended to replace advice given to you by your health care provider. Make sure you discuss any questions you have with your health care provider. Document Revised: 06/23/2019 Document Reviewed: 04/19/2019 Elsevier Patient Education  [place]. Hypoglycemia Hypoglycemia occurs when the level of  sugar (glucose) in the blood is too low. Hypoglycemia can happen in people who have or do not have diabetes. It can develop quickly, and it can be a medical emergency. For most people, a blood glucose level below 70 mg/dL (3.9 mmol/L) is considered hypoglycemia. Glucose is a type of sugar that provides the body's main source of energy. Certain hormones (insulin and glucagon) control the level of glucose in the blood. Insulin lowers blood glucose, and glucagon raises blood glucose. Hypoglycemia can result from having too much insulin in the bloodstream, or from not eating enough food that contains glucose. You may also have reactive hypoglycemia, which happens within 4 hours after eating a meal. What are the causes? Hypoglycemia occurs most often in people who have diabetes and may be caused by: Diabetes medicine. Not eating enough, or not eating often enough. Increased physical activity. Drinking alcohol on an empty stomach. If you do not have diabetes, hypoglycemia may be caused by: A tumor in the pancreas. Not eating enough, or not eating for long periods at a time (fasting). A severe infection or illness. Problems after having bariatric surgery. Organ failure, such as kidney or liver failure. Certain medicines. What increases the risk? Hypoglycemia is more likely to develop in people who: Have diabetes and take medicines to lower blood glucose. Abuse alcohol. Have a severe illness. What are the signs or symptoms? Symptoms vary depending on whether the condition is mild, moderate, or severe. Mild hypoglycemia  Hunger. Sweating and feeling clammy. Dizziness or feeling light-headed. Sleepiness or restless sleep. Nausea. Increased heart rate. Headache. Blurry vision. Mood changes, such as irritability or anxiety. Tingling or numbness around the mouth, lips, or tongue. Moderate hypoglycemia Confusion and poor judgment. Behavior changes. Weakness. Irregular heartbeat. A change in  coordination. Severe hypoglycemia Severe hypoglycemia is a medical emergency. It can cause: Fainting. Seizures. Loss of consciousness (coma). Death. How is this diagnosed? Hypoglycemia is diagnosed with a blood test to measure your blood glucose level. This blood test is done while you are having symptoms. Your health care provider may also do a physical exam and review your medical history. How is this treated? This condition can be treated by immediately eating or drinking something that contains sugar with 15 grams of fast-acting carbohydrate, such as: 4 oz (120 mL) of fruit juice. 4 oz (120 mL) of regular soda (not diet soda). Several pieces of hard candy. Check food labels to find out how many pieces to eat for 15 grams. 1 Tbsp (15 mL) of sugar or honey. 4 glucose tablets. 1 tube of glucose gel. Treating hypoglycemia if you have diabetes If you are alert and able to swallow safely, follow the 15:15 rule: Take 15 grams of a fast-acting carbohydrate. Talk with your health care provider about how much you should take. Options for getting 15 grams of fast-acting carbohydrate include: Glucose tablets (take 4 tablets). Several pieces of hard candy. Check food labels to find out how many pieces to eat for 15 grams. 4 oz (120 mL) of fruit juice. 4 oz (120 mL) of regular soda (not diet soda). 1 Tbsp (15 mL) of sugar or honey. 1 tube of glucose gel. Check your blood glucose 15 minutes after you take the carbohydrate. If the repeat blood glucose level is still at or below 70 mg/dL (3.9 mmol/L), take 15 grams of a carbohydrate again. If your blood glucose level does not increase above 70 mg/dL (3.9 mmol/L) after 3 tries, seek emergency medical care. After your blood glucose level returns to normal, eat a meal or a snack within 1 hour.  Treating severe hypoglycemia Severe hypoglycemia is when your blood glucose level is below 54 mg/dL (3 mmol/L). Severe hypoglycemia is a medical emergency.  Get medical help right away. If you have severe hypoglycemia and you cannot eat or drink, you will need to be given glucagon. A family member or close friend should learn how to check your blood glucose and how to give you glucagon. Ask your health care provider if you need to have an emergency glucagon kit available. Severe hypoglycemia may need to be treated in a hospital. The treatment may include getting glucose through an IV. You may also need treatment for the cause of your hypoglycemia. Follow these instructions at home:  General instructions Take over-the-counter and prescription medicines only as told by your health care provider. Monitor your blood glucose as told by your health care provider. If you drink alcohol: Limit how much you have to: 0-1 drink a day for women who are not pregnant. 0-2 drinks a day for men. Know how much alcohol is in your drink. In the [place] one drink equals one 12 oz bottle of beer (355 mL), one 5 oz glass of wine (148 mL), or one 1 oz glass of hard liquor (44 mL). Be sure to eat food along with drinking alcohol. Be aware that alcohol is absorbed quickly and may have lingering effects that may result in hypoglycemia later. Be  sure to do ongoing glucose monitoring. Keep all follow-up visits. This is important. If you have diabetes: Always have a fast-acting carbohydrate (15 grams) option with you to treat low blood glucose. Follow your diabetes management plan as directed by your health care provider. Make sure you: Know the symptoms of hypoglycemia. It is important to treat it right away to prevent it from becoming severe. Check your blood glucose as often as told. Always check before and after exercise. Always check your blood glucose before you drive a motorized vehicle. Take your medicines as told. Follow your meal plan. Eat on time, and do not skip meals. Share your diabetes management plan with people in your workplace, school, and household. Carry  a medical alert card or wear medical alert jewelry. Where to find more information American Diabetes Association: www.diabetes.org Contact a health care provider if: You have problems keeping your blood glucose in your target range. You have frequent episodes of hypoglycemia. Get help right away if: You continue to have hypoglycemia symptoms after eating or drinking something that contains 15 grams of fast-acting carbohydrate, and you cannot get your blood glucose above 70 mg/dL (3.9 mmol/L) while following the 15:15 rule. Your blood glucose is below 54 mg/dL (3 mmol/L). You have a seizure. You faint. These symptoms may represent a serious problem that is an emergency. Do not wait to see if the symptoms will go away. Get medical help right away. Call your local emergency services (911 in the [place] Do not drive yourself to the hospital. Summary Hypoglycemia occurs when the level of sugar (glucose) in the blood is too low. Hypoglycemia can happen in people who have or do not have diabetes. It can develop quickly, and it can be a medical emergency. Make sure you know the symptoms of hypoglycemia and how to treat it. Always have a fast-acting carbohydrate option with you to treat low blood sugar. This information is not intended to replace advice given to you by your health care provider. Make sure you discuss any questions you have with your health care provider. Document Revised: 04/18/2020 Document Reviewed: 04/18/2020 Elsevier Patient Education  [place].

## 2022-04-21 NOTE — Chronic Care Management (AMB) (Signed)
Chronic Care Management   CCM RN Visit Note  04/21/2022 Name: Emily [name] MRN: [id number] DOB: 04/03/1951  Subjective: Emily [name] is a 71 y.o. year old female who is a primary care patient of Janora Norlander, DO. The patient was referred to the Chronic Care Management team for assistance with care management needs subsequent to provider initiation of CCM services and plan of care.    Today's Visit:  Engaged with patient by telephone for initial visit.     SDOH Interventions Today    Flowsheet Row Most Recent Value  SDOH Interventions   Food Insecurity Interventions Intervention Not Indicated  Housing Interventions Intervention Not Indicated  Transportation Interventions Intervention Not Indicated  Utilities Interventions Intervention Not Indicated  Financial Strain Interventions Intervention Not Indicated  Physical Activity Interventions Intervention Not Indicated  Stress Interventions Intervention Not Indicated  Social Connections Interventions Intervention Not Indicated         Goals Addressed             This Visit's Progress    CCM (DIABETES) EXPECTED OUTCOME:  MONITOR, SELF-MANAGE AND REDUCE SYMPTOMS OF DIABETES       Current Barriers:  Knowledge Deficits related to Diabetes management Chronic Disease Management support and education needs related to Diabetes and diet No Advanced Directives in place- information to be mailed per pt request Patient reports she does not check CBG, has glucometer and knows how to use, states AIC is good and does not feel the need to check Patient reports she does not always follow a special diet, drinks mostly unsweetened tea, diet drinks and water Patient states she does not exercise, does continue to work out of her home  Planned Interventions: Provided education to patient about basic DM disease process; Reviewed medications with patient and discussed importance of medication adherence;        Reviewed prescribed  diet with patient carbohydrate modified, plate method; Counseled on importance of regular laboratory monitoring as prescribed;        Discussed plans with patient for ongoing care management follow up and provided patient with direct contact information for care management team;      Provided patient with written educational materials related to hypo and hyperglycemia and importance of correct treatment;       Review of patient status, including review of consultants reports, relevant laboratory and other test results, and medications completed;       Screening for signs and symptoms of depression related to chronic disease state;        Assessed social determinant of health barriers;        Reviewed importance of exercise and benefit with blood sugar levels  Symptom Management: Take medications as prescribed   Attend all scheduled provider appointments Call pharmacy for medication refills 3-7 days in advance of running out of medications Attend church or other social activities Perform all self care activities independently  Perform IADL's (shopping, preparing meals, housekeeping, managing finances) independently Call provider office for new concerns or questions  check blood sugar at prescribed times: once daily check feet daily for cuts, sores or redness enter blood sugar readings and medication or insulin into daily log take the blood sugar log to all doctor visits take the blood sugar meter to all doctor visits trim toenails straight across drink 6 to 8 glasses of water each day fill half of plate with vegetables limit fast food meals to no more than 1 per week read food labels for fat, fiber, carbohydrates and  portion size wash and dry feet carefully every day wear comfortable, cotton socks Look over education mailed - hypoglycemia  Follow Up Plan: Telephone follow up appointment with care management team member scheduled for:  06/16/22 at 9 am       CCM (HYPERTENSION) EXPECTED  OUTCOME: MONITOR, SELF-MANAGE AND REDUCE SYMPTOMS OF HYPERTENSION       Current Barriers:  Knowledge Deficits related to Hypertension management Chronic Disease Management support and education needs related to Hypertension, diet, exercise No Advanced Directives in place- pt requests information be mailed Patient reports she lives alone, is independent with all aspects of her care, 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   Patient is currently attending outpatient PT for lower back/ spine issues, this is 5-6 sessions and if does not help pt states she will be most likely be having an MRI.  Planned Interventions: Evaluation of current treatment plan related to hypertension self management and patient's adherence to plan as established by provider;   Provided education to patient re: stroke prevention, s/s of heart attack and stroke; Reviewed prescribed diet low sodium Reviewed medications with patient and discussed importance of compliance;  Counseled on the importance of exercise goals with target of 150 minutes per week Discussed plans with patient for ongoing care management follow up and provided patient with direct contact information for care management team; Advised patient, providing education and rationale, to monitor blood pressure daily and record, calling PCP for findings outside established parameters;  Discussed complications of poorly controlled blood pressure such as heart disease, stroke, circulatory complications, vision complications, kidney impairment, sexual dysfunction;  Screening for signs and symptoms of depression related to chronic disease state;   Assessed social determinant of health barriers;  Education mailed - low sodium diet Safety precautions reviewed  Symptom Management: Take medications as prescribed   Attend all scheduled provider appointments Call pharmacy for medication refills 3-7 days in advance of running out of medications Attend church or other social activities Perform all self care activities independently  Perform IADL's (shopping, preparing meals, housekeeping, managing finances) independently Call provider office for new concerns or questions  check blood pressure weekly choose a place to take my blood pressure (home, clinic or office, retail store) write blood pressure results in a log or diary keep a blood pressure log take blood pressure log to all doctor appointments call doctor for signs and symptoms of high blood pressure take medications for blood pressure exactly as prescribed begin an exercise program eat more whole grains, fruits and vegetables, lean meats and healthy fats Look over education mailed- low sodium diet Advanced directives packet mailed fall prevention strategies: change position slowly, use assistive device such as walker or cane (per provider recommendations) when walking, keep walkways clear, have good lighting in room. It is important to contact your provider if you have any falls, maintain muscle strength/tone by exercise per provider recommendations.  Follow Up Plan: Telephone follow up appointment with care management team member scheduled for:  06/16/22 at 9 am          Plan:Telephone follow up appointment with care management team member scheduled  for:  06/16/22 at 9 am  Jacqlyn Larsen [facility], BSN RN Case Manager [place] [phone]

## 2022-04-21 NOTE — Therapy (Signed)
OUTPATIENT PHYSICAL THERAPY THORACOLUMBAR EVALUATION   Patient Name: Emily [name] MRN: [id number] DOB:[birth year], 71 y.o., female Today's Date: 04/21/2022  END OF SESSION:  PT End of Session - 04/21/22 1125     Visit Number 1    Number of Visits 8    Date for PT Re-Evaluation 05/22/22    PT Start Time 1122    PT Stop Time 1200    PT Time Calculation (min) 38 min    Activity Tolerance Patient tolerated treatment well    Behavior During Therapy [facility] for tasks assessed/performed             Past Medical History:  Diagnosis Date   Anemia    Arthritis    Atrial fibrillation, rapid ([place]) 10/27/2013   Bilateral carotid artery disease ([place])    Diabetes mellitus without complication ([place])    Difficult intubation    states 'lady that did the sleep study told me I have the smallest airway she has ever seen in an adult"   Dyslipidemia 06/29/2017   Dysrhythmia    Hypertension    Hypothyroid    Obesity    Obstructive sleep apnea    on C Pap   PAF (paroxysmal atrial fibrillation) ([place])    a. newly dx in 09/2013; on eliquis   Past Surgical History:  Procedure Laterality Date   CATARACT EXTRACTION Left    CATARACT EXTRACTION W/PHACO Right 12/09/2015   Procedure: CATARACT EXTRACTION PHACO AND INTRAOCULAR LENS PLACEMENT ([place]);  Surgeon: Tonny Branch, MD;  Location: [facility];  Service: Ophthalmology;  Laterality: Right;  CDE:10.48   COLONOSCOPY W/ POLYPECTOMY     cyst on back of neck removed     DILATION AND CURETTAGE OF UTERUS     x 5   ENDARTERECTOMY Left 08/30/2015   Procedure: LEFT CAROYID ENDARTERECTOMY WITH XENOSURE BOVINE PERICARDIUM PATCH ANGIOPLASTY;  Surgeon: [name], MD;  Location: [facility];  Service: Vascular;  Laterality: Left;   EYE SURGERY     TONSILLECTOMY     UPPER GI ENDOSCOPY     Patient Active Problem List   Diagnosis Date Noted   Acute lower UTI    Atrial fibrillation with RVR ([place]) 08/30/2020   Chest pain 08/30/2020   Nonrheumatic aortic valve  stenosis 03/19/2020   Persistent atrial fibrillation ([place]) 01/16/2020   Bilateral carotid artery stenosis 10/12/2019   Educated about COVID-19 virus infection 10/12/2019   Acute on chronic blood loss anemia 09/25/2019   OSA on CPAP 09/25/2019   Upper GI bleed 09/25/2019   History of adenomatous polyp of colon 08/30/2019   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 device 07/16/2018   Chronic anticoagulation 03/03/2018   Melena 03/03/2018   Restless leg syndrome 02/16/2018   Chronic left-sided low back pain with left-sided sciatica 02/16/2018   Morbid obesity ([place]) 02/14/2018   Bruit 02/14/2018   PAF (paroxysmal atrial fibrillation) ([place]) 02/14/2018   DDD (degenerative disc disease), lumbar 12/07/2017   Peripheral arterial disease ([place]) 07/06/2017   Type 2 diabetes mellitus with hyperlipidemia ([place]) 06/29/2017   Iron deficiency anemia 05/27/2017   Vitamin B12 deficiency 05/27/2017   Pain in joint, ankle and foot 06/03/2016   Carotid stenosis 08/30/2015   Bilateral carotid artery disease ([place]) 07/05/2015   Acute bronchitis 01/31/2015   Multiple gastric polyps 07/03/2014   Diabetes mellitus without complication ([place])    Hypertension associated with diabetes ([place]) 10/27/2013   Hypothyroidism due to acquired atrophy of thyroid 10/27/2013   Hypothyroidism 06/06/2010   Nonspecific  elevation of levels of transaminase or lactic acid dehydrogenase (LDH) 02/14/2007   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[place]) 02/14/2007   Cellulitis and abscess of finger, unspecified 01/17/2007    PCP: Janora Norlander, DO  REFERRING PROVIDER: Franki Monte, FNP   REFERRING DIAG: Radiculopathy, lumbar region   Rationale for Evaluation and Treatment: Rehabilitation  THERAPY DIAG:  Muscle weakness (generalized)  History of falling  Pain in right thigh  ONSET DATE: September 2023  SUBJECTIVE:                                                                                                                                                                                            SUBJECTIVE STATEMENT: Patient reports that she has been having numbness in her right thigh since around September 2023. She initially noticed pain in her right thigh at night, but it slowly progressed to numbness on the front of her thigh. She has not had any problems like this before.  She notes that she has fallen twice since she last saw her referring physician. These falls were due to her not picking up her feet. She has begun to have some trouble standing for long periods at work due to her back getting tight, but she did not have problems with this until after her last fall.   PERTINENT HISTORY:  Allergies, hypertension, diabetes, atrial fibrillation, obesity, arthritis  PAIN:  Are you having pain? Yes: NPRS scale: 1-2/10 Pain location: right thigh Pain description: dull, pins and needles  PRECAUTIONS: Fall  WEIGHT BEARING RESTRICTIONS: No  FALLS:  Has patient fallen in last 6 months? Yes. Number of falls 2  LIVING ENVIRONMENT: Lives with: lives alone Lives in: House/apartment Stairs: Yes: External: 3 steps; none, but she has a handicap bar that she can reach from the ground Has following equipment at home: None  OCCUPATION: hair dresser; working 5 days per week   PLOF: Independent  PATIENT GOALS: get a MRI, get normal sensation in her right thigh, and reduce fall risk   NEXT MD VISIT: to be scheduled after therapy  OBJECTIVE:   DIAGNOSTIC FINDINGS: Lumbar x-ray on 03/17/22 IMPRESSION: No acute fracture or traumatic malalignment.  SCREENING FOR RED FLAGS: Bowel or bladder incontinence: No Spinal tumors: No Cauda equina syndrome: No Compression fracture: No Abdominal aneurysm: No  COGNITION: Overall cognitive status: Within functional limits for tasks assessed     SENSATION: Patient reports altered sensation along her anterior right thigh. Increased sensitivity to light  touch along her right thigh with no dermatomal pattern.  POSTURE: rounded shoulders, forward head, and decreased lumbar lordosis  PALPATION: TTP: bilateral lumbar paraspinals  and L5-S1 spinous process   LUMBAR ROM:   AROM eval  Flexion 70  Extension 10  Right lateral flexion 75% limited  Left lateral flexion 75% limited  Right rotation 50% limited  Left rotation 50% limited   (Blank rows = not tested)  LOWER EXTREMITY ROM: WFL for activities assessed  LOWER EXTREMITY MMT:    MMT Right eval Left eval  Hip flexion 3/5 3+/5  Hip extension    Hip abduction    Hip adduction    Hip internal rotation    Hip external rotation    Knee flexion 4/5 4/5  Knee extension 4/5 4+/5  Ankle dorsiflexion 4/5 4/5  Ankle plantarflexion    Ankle inversion    Ankle eversion     (Blank rows = not tested)  FUNCTIONAL TESTS:  5 times sit to stand: 18.28 seconds w/o UE support  Timed up and go (TUG): 14.97 seconds  GAIT: Assistive device utilized: None Level of assistance: SBA Comments: Decreased gait speed, stride length, hip extension bilaterally, and increased lateral sway  TODAY'S TREATMENT:                                                                                                                              DATE:     PATIENT EDUCATION:  Education details: Plan of care, goals for therapy, and objective results Person educated: Patient Education method: Explanation Education comprehension: verbalized understanding  HOME EXERCISE PROGRAM:   ASSESSMENT:  CLINICAL IMPRESSION: Patient is a 71 y.o. female who was seen today for physical therapy evaluation and treatment for altered sensation in her right thigh and a history of falling.  She is at an elevated risk of falling due to her lower extremity weakness, timed up and go score, 5 times sit to stand time, and her history of falling.  She also exhibits gait deviations which increase her risk of falling.  Recommend that she  continue with skilled physical therapy to address her impairments to return to her prior level of function.  OBJECTIVE IMPAIRMENTS: Abnormal gait, decreased balance, decreased mobility, difficulty walking, decreased ROM, decreased strength, hypomobility, impaired sensation, impaired tone, postural dysfunction, and pain.   ACTIVITY LIMITATIONS: standing, stairs, and locomotion level  PARTICIPATION LIMITATIONS: meal prep, cleaning, laundry, shopping, community activity, occupation, and church  PERSONAL FACTORS: Profession and 3+ comorbidities: Allergies, hypertension, diabetes, atrial fibrillation, obesity, arthritis  are also affecting patient's functional outcome.   REHAB POTENTIAL: Fair    CLINICAL DECISION MAKING: Evolving/moderate complexity  EVALUATION COMPLEXITY: Moderate   GOALS: Goals reviewed with patient? Yes  LONG TERM GOALS: Target date: 05/19/22  Patient will be independent with her HEP. Baseline:  Goal status: INITIAL  2.  Patient will improve her 5 times sit to stand to 12 seconds or less. Baseline:  Goal status: INITIAL  3.  Patient will improve her timed up and go to 12 seconds or less for improved safety. Baseline:  Goal status: INITIAL  4.  Patient will improve her bilateral hip flexion strength to at least 4/5. Baseline:  Goal status: INITIAL   PLAN:  PT FREQUENCY: 2x/week  PT DURATION: 4 weeks  PLANNED INTERVENTIONS: Therapeutic exercises, Therapeutic activity, Neuromuscular re-education, Balance training, Gait training, Patient/Family education, Self Care, Joint mobilization, Stair training, Dry Needling, Electrical stimulation, Spinal mobilization, Cryotherapy, Moist heat, Traction, Manual therapy, and Re-evaluation.  PLAN FOR NEXT SESSION: NuStep, lower extremity strengthening, balance interventions, and modalities as needed   Darlin Coco, PT 04/21/2022, 5:13 PM

## 2022-04-28 ENCOUNTER — Ambulatory Visit: Payer: Medicare Other

## 2022-04-28 DIAGNOSIS — Z9181 History of falling: Secondary | ICD-10-CM

## 2022-04-28 DIAGNOSIS — M79651 Pain in right thigh: Secondary | ICD-10-CM | POA: Diagnosis not present

## 2022-04-28 DIAGNOSIS — M6281 Muscle weakness (generalized): Secondary | ICD-10-CM | POA: Diagnosis not present

## 2022-04-28 NOTE — Therapy (Signed)
OUTPATIENT PHYSICAL THERAPY THORACOLUMBAR TREATMENT   Patient Name: Emily [name] MRN: [id number] DOB:February 11, 1951, 71 y.o., female Today's Date: 04/28/2022  END OF SESSION:  PT End of Session - 04/28/22 1117     Visit Number 2    Number of Visits 8    Date for PT Re-Evaluation 05/22/22    PT Start Time 1115    PT Stop Time 1158    PT Time Calculation (min) 43 min    Activity Tolerance Patient tolerated treatment well    Behavior During Therapy [facility] for tasks assessed/performed              Past Medical History:  Diagnosis Date   Anemia    Arthritis    Atrial fibrillation, rapid ([place]) 10/27/2013   Bilateral carotid artery disease ([place])    Diabetes mellitus without complication ([place])    Difficult intubation    states 'lady that did the sleep study told me I have the smallest airway she has ever seen in an adult"   Dyslipidemia 06/29/2017   Dysrhythmia    Hypertension    Hypothyroid    Obesity    Obstructive sleep apnea    on C Pap   PAF (paroxysmal atrial fibrillation) ([place])    a. newly dx in 09/2013; on eliquis   Past Surgical History:  Procedure Laterality Date   CATARACT EXTRACTION Left    CATARACT EXTRACTION W/PHACO Right 12/09/2015   Procedure: CATARACT EXTRACTION PHACO AND INTRAOCULAR LENS PLACEMENT ([place]);  Surgeon: Tonny Branch, MD;  Location: [facility];  Service: Ophthalmology;  Laterality: Right;  CDE:10.48   COLONOSCOPY W/ POLYPECTOMY     cyst on back of neck removed     DILATION AND CURETTAGE OF UTERUS     x 5   ENDARTERECTOMY Left 08/30/2015   Procedure: LEFT CAROYID ENDARTERECTOMY WITH XENOSURE BOVINE PERICARDIUM PATCH ANGIOPLASTY;  Surgeon: [name], MD;  Location: [place];  Service: Vascular;  Laterality: Left;   EYE SURGERY     TONSILLECTOMY     UPPER GI ENDOSCOPY     Patient Active Problem List   Diagnosis Date Noted   Acute lower UTI    Atrial fibrillation with RVR ([place]) 08/30/2020   Chest pain 08/30/2020   Nonrheumatic aortic valve  stenosis 03/19/2020   Persistent atrial fibrillation ([place]) 01/16/2020   Bilateral carotid artery stenosis 10/12/2019   Educated about COVID-19 virus infection 10/12/2019   Acute on chronic blood loss anemia 09/25/2019   OSA on CPAP 09/25/2019   Upper GI bleed 09/25/2019   History of adenomatous polyp of colon 08/30/2019   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 device 07/16/2018   Chronic anticoagulation 03/03/2018   Melena 03/03/2018   Restless leg syndrome 02/16/2018   Chronic left-sided low back pain with left-sided sciatica 02/16/2018   Morbid obesity ([place]) 02/14/2018   Bruit 02/14/2018   PAF (paroxysmal atrial fibrillation) ([place]) 02/14/2018   DDD (degenerative disc disease), lumbar 12/07/2017   Peripheral arterial disease ([place]) 07/06/2017   Type 2 diabetes mellitus with hyperlipidemia ([place]) 06/29/2017   Iron deficiency anemia 05/27/2017   Vitamin B12 deficiency 05/27/2017   Pain in joint, ankle and foot 06/03/2016   Carotid stenosis 08/30/2015   Bilateral carotid artery disease ([place]) 07/05/2015   Acute bronchitis 01/31/2015   Multiple gastric polyps 07/03/2014   Diabetes mellitus without complication ([place])    Hypertension associated with diabetes ([place]) 10/27/2013   Hypothyroidism due to acquired atrophy of thyroid 10/27/2013   Hypothyroidism 06/06/2010  Nonspecific elevation of levels of transaminase or lactic acid dehydrogenase (LDH) 02/14/2007   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[place]) 02/14/2007   Cellulitis and abscess of finger, unspecified 01/17/2007    PCP: Janora Norlander, DO  REFERRING PROVIDER: Franki Monte, FNP   REFERRING DIAG: Radiculopathy, lumbar region   Rationale for Evaluation and Treatment: Rehabilitation  THERAPY DIAG:  Muscle weakness (generalized)  History of falling  Pain in right thigh  ONSET DATE: September 2023  SUBJECTIVE:                                                                                                                                                                                            SUBJECTIVE STATEMENT: Patient reports that she feels about the same since her evaluation. She notes that she still has a strange sensation in her right thigh.   PERTINENT HISTORY:  Allergies, hypertension, diabetes, atrial fibrillation, obesity, arthritis  PAIN:  Are you having pain? Yes: NPRS scale: 1-2/10 Pain location: right thigh Pain description: dull, pins and needles  PRECAUTIONS: Fall  WEIGHT BEARING RESTRICTIONS: No  FALLS:  Has patient fallen in last 6 months? Yes. Number of falls 2  LIVING ENVIRONMENT: Lives with: lives alone Lives in: House/apartment Stairs: Yes: External: 3 steps; none, but she has a handicap bar that she can reach from the ground Has following equipment at home: None  OCCUPATION: hair dresser; working 5 days per week   PLOF: Independent  PATIENT GOALS: get a MRI, get normal sensation in her right thigh, and reduce fall risk   NEXT MD VISIT: to be scheduled after therapy  OBJECTIVE: all objective measures were assessed at her initial evaluation on 04/21/22 unless otherwise noted  DIAGNOSTIC FINDINGS: Lumbar x-ray on 03/17/22 IMPRESSION: No acute fracture or traumatic malalignment.  SCREENING FOR RED FLAGS: Bowel or bladder incontinence: No Spinal tumors: No Cauda equina syndrome: No Compression fracture: No Abdominal aneurysm: No  COGNITION: Overall cognitive status: Within functional limits for tasks assessed     SENSATION: Patient reports altered sensation along her anterior right thigh. Increased sensitivity to light touch along her right thigh with no dermatomal pattern.  POSTURE: rounded shoulders, forward head, and decreased lumbar lordosis  PALPATION: TTP: bilateral lumbar paraspinals and L5-S1 spinous process   LUMBAR ROM:   AROM eval  Flexion 70  Extension 10  Right lateral flexion 75% limited  Left lateral flexion 75% limited   Right rotation 50% limited  Left rotation 50% limited   (Blank rows = not tested)  LOWER EXTREMITY ROM: WFL for activities assessed  LOWER EXTREMITY MMT:    MMT Right eval Left eval  Hip flexion 3/5 3+/5  Hip extension    Hip abduction    Hip adduction    Hip internal rotation    Hip external rotation    Knee flexion 4/5 4/5  Knee extension 4/5 4+/5  Ankle dorsiflexion 4/5 4/5  Ankle plantarflexion    Ankle inversion    Ankle eversion     (Blank rows = not tested)  FUNCTIONAL TESTS:  5 times sit to stand: 18.28 seconds w/o UE support  Timed up and go (TUG): 14.97 seconds  GAIT: Assistive device utilized: None Level of assistance: SBA Comments: Decreased gait speed, stride length, hip extension bilaterally, and increased lateral sway  TODAY'S TREATMENT:                                                                                                                              DATE:                                     11/28 EXERCISE LOG  Exercise Repetitions and Resistance Comments  Standing heel/toe raises  2 minutes   Marching on foam  2 minutes   Standing hip extension 2 minutes  Alternating LE   Lateral step up  4" step; 2 minutes   LAQ 2.5 minutes;  Alternating LE  Nustep  L3 x 10 minutes   Resisted pull down  Blue XTS x 2 minutes   Resisted clams Green t-band x 3 minutes        Blank cell = exercise not performed today   PATIENT EDUCATION:  Education details: Plan of care, goals for therapy, and objective results Person educated: Patient Education method: Explanation Education comprehension: verbalized understanding  HOME EXERCISE PROGRAM:   ASSESSMENT:  CLINICAL IMPRESSION: Patient was introduced to multiple new interventions for improved lumbar and lower extremity strength needed for safety and improved function with her daily activities. She required minimal cueing with standing hip extension to prevent lumbar flexion to isolate gluteal and  posterior chain engagement. She reported feeling alright upon the conclusion of treatment. She continues to require skilled physical therapy to address her remaining impairments to return to her prior level of function.    OBJECTIVE IMPAIRMENTS: Abnormal gait, decreased balance, decreased mobility, difficulty walking, decreased ROM, decreased strength, hypomobility, impaired sensation, impaired tone, postural dysfunction, and pain.   ACTIVITY LIMITATIONS: standing, stairs, and locomotion level  PARTICIPATION LIMITATIONS: meal prep, cleaning, laundry, shopping, community activity, occupation, and church  PERSONAL FACTORS: Profession and 3+ comorbidities: Allergies, hypertension, diabetes, atrial fibrillation, obesity, arthritis  are also affecting patient's functional outcome.   REHAB POTENTIAL: Fair    CLINICAL DECISION MAKING: Evolving/moderate complexity  EVALUATION COMPLEXITY: Moderate   GOALS: Goals reviewed with patient? Yes  LONG TERM GOALS: Target date: 05/19/22  Patient will be independent with her HEP. Baseline:  Goal status: INITIAL  2.  Patient will improve  her 5 times sit to stand to 12 seconds or less. Baseline:  Goal status: INITIAL  3.  Patient will improve her timed up and go to 12 seconds or less for improved safety. Baseline:  Goal status: INITIAL  4.  Patient will improve her bilateral hip flexion strength to at least 4/5. Baseline:  Goal status: INITIAL   PLAN:  PT FREQUENCY: 2x/week  PT DURATION: 4 weeks  PLANNED INTERVENTIONS: Therapeutic exercises, Therapeutic activity, Neuromuscular re-education, Balance training, Gait training, Patient/Family education, Self Care, Joint mobilization, Stair training, Dry Needling, Electrical stimulation, Spinal mobilization, Cryotherapy, Moist heat, Traction, Manual therapy, and Re-evaluation.  PLAN FOR NEXT SESSION: NuStep, lower extremity strengthening, balance interventions, and modalities as  needed   Darlin Coco, PT 04/28/2022, 12:57 PM

## 2022-04-30 DIAGNOSIS — I10 Essential (primary) hypertension: Secondary | ICD-10-CM

## 2022-04-30 DIAGNOSIS — E1159 Type 2 diabetes mellitus with other circulatory complications: Secondary | ICD-10-CM

## 2022-05-01 ENCOUNTER — Ambulatory Visit: Payer: Medicare Other | Attending: Family Medicine

## 2022-05-01 DIAGNOSIS — M79651 Pain in right thigh: Secondary | ICD-10-CM | POA: Insufficient documentation

## 2022-05-01 DIAGNOSIS — Z9181 History of falling: Secondary | ICD-10-CM | POA: Diagnosis not present

## 2022-05-01 DIAGNOSIS — M6281 Muscle weakness (generalized): Secondary | ICD-10-CM | POA: Diagnosis not present

## 2022-05-01 NOTE — Therapy (Signed)
OUTPATIENT PHYSICAL THERAPY THORACOLUMBAR TREATMENT   Patient Name: Emily [name] MRN: [id number] DOB:1951/02/27, 71 y.o., female Today's Date: 05/01/2022  END OF SESSION:  PT End of Session - 05/01/22 0903     Visit Number 3    Number of Visits 8    Date for PT Re-Evaluation 05/22/22    PT Start Time 0900    PT Stop Time 0945    PT Time Calculation (min) 45 min    Activity Tolerance Patient tolerated treatment well    Behavior During Therapy [facility] for tasks assessed/performed              Past Medical History:  Diagnosis Date   Anemia    Arthritis    Atrial fibrillation, rapid ([place]) 10/27/2013   Bilateral carotid artery disease ([place])    Diabetes mellitus without complication ([place])    Difficult intubation    states 'lady that did the sleep study told me I have the smallest airway she has ever seen in an adult"   Dyslipidemia 06/29/2017   Dysrhythmia    Hypertension    Hypothyroid    Obesity    Obstructive sleep apnea    on C Pap   PAF (paroxysmal atrial fibrillation) ([place])    a. newly dx in 09/2013; on eliquis   Past Surgical History:  Procedure Laterality Date   CATARACT EXTRACTION Left    CATARACT EXTRACTION W/PHACO Right 12/09/2015   Procedure: CATARACT EXTRACTION PHACO AND INTRAOCULAR LENS PLACEMENT ([place]);  Surgeon: Tonny Branch, MD;  Location: [facility];  Service: Ophthalmology;  Laterality: Right;  CDE:10.48   COLONOSCOPY W/ POLYPECTOMY     cyst on back of neck removed     DILATION AND CURETTAGE OF UTERUS     x 5   ENDARTERECTOMY Left 08/30/2015   Procedure: LEFT CAROYID ENDARTERECTOMY WITH XENOSURE BOVINE PERICARDIUM PATCH ANGIOPLASTY;  Surgeon: [name], MD;  Location: [place];  Service: Vascular;  Laterality: Left;   EYE SURGERY     TONSILLECTOMY     UPPER GI ENDOSCOPY     Patient Active Problem List   Diagnosis Date Noted   Acute lower UTI    Atrial fibrillation with RVR ([place]) 08/30/2020   Chest pain 08/30/2020   Nonrheumatic aortic valve  stenosis 03/19/2020   Persistent atrial fibrillation ([place]) 01/16/2020   Bilateral carotid artery stenosis 10/12/2019   Educated about COVID-19 virus infection 10/12/2019   Acute on chronic blood loss anemia 09/25/2019   OSA on CPAP 09/25/2019   Upper GI bleed 09/25/2019   History of adenomatous polyp of colon 08/30/2019   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 device 07/16/2018   Chronic anticoagulation 03/03/2018   Melena 03/03/2018   Restless leg syndrome 02/16/2018   Chronic left-sided low back pain with left-sided sciatica 02/16/2018   Morbid obesity ([place]) 02/14/2018   Bruit 02/14/2018   PAF (paroxysmal atrial fibrillation) ([place]) 02/14/2018   DDD (degenerative disc disease), lumbar 12/07/2017   Peripheral arterial disease ([place]) 07/06/2017   Type 2 diabetes mellitus with hyperlipidemia ([place]) 06/29/2017   Iron deficiency anemia 05/27/2017   Vitamin B12 deficiency 05/27/2017   Pain in joint, ankle and foot 06/03/2016   Carotid stenosis 08/30/2015   Bilateral carotid artery disease ([place]) 07/05/2015   Acute bronchitis 01/31/2015   Multiple gastric polyps 07/03/2014   Diabetes mellitus without complication ([place])    Hypertension associated with diabetes ([place]) 10/27/2013   Hypothyroidism due to acquired atrophy of thyroid 10/27/2013   Hypothyroidism 06/06/2010  Nonspecific elevation of levels of transaminase or lactic acid dehydrogenase (LDH) 02/14/2007   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[place]) 02/14/2007   Cellulitis and abscess of finger, unspecified 01/17/2007    PCP: Janora Norlander, DO  REFERRING PROVIDER: Franki Monte, FNP   REFERRING DIAG: Radiculopathy, lumbar region   Rationale for Evaluation and Treatment: Rehabilitation  THERAPY DIAG:  Muscle weakness (generalized)  History of falling  Pain in right thigh  ONSET DATE: September 2023  SUBJECTIVE:                                                                                                                                                                                            SUBJECTIVE STATEMENT: Patient reports that her back was really bothering her yesterday. She notes that she felt ok after her last appointment, but she is hurting a more today.   PERTINENT HISTORY:  Allergies, hypertension, diabetes, atrial fibrillation, obesity, arthritis  PAIN:  Are you having pain? Yes: NPRS scale: 6-7/10 Pain location: right thigh Pain description: dull, pins and needles  PRECAUTIONS: Fall  WEIGHT BEARING RESTRICTIONS: No  FALLS:  Has patient fallen in last 6 months? Yes. Number of falls 2  LIVING ENVIRONMENT: Lives with: lives alone Lives in: House/apartment Stairs: Yes: External: 3 steps; none, but she has a handicap bar that she can reach from the ground Has following equipment at home: None  OCCUPATION: hair dresser; working 5 days per week   PLOF: Independent  PATIENT GOALS: get a MRI, get normal sensation in her right thigh, and reduce fall risk   NEXT MD VISIT: to be scheduled after therapy  OBJECTIVE: all objective measures were assessed at her initial evaluation on 04/21/22 unless otherwise noted  DIAGNOSTIC FINDINGS: Lumbar x-ray on 03/17/22 IMPRESSION: No acute fracture or traumatic malalignment.  SCREENING FOR RED FLAGS: Bowel or bladder incontinence: No Spinal tumors: No Cauda equina syndrome: No Compression fracture: No Abdominal aneurysm: No  COGNITION: Overall cognitive status: Within functional limits for tasks assessed     SENSATION: Patient reports altered sensation along her anterior right thigh. Increased sensitivity to light touch along her right thigh with no dermatomal pattern.  POSTURE: rounded shoulders, forward head, and decreased lumbar lordosis  PALPATION: TTP: bilateral lumbar paraspinals and L5-S1 spinous process   LUMBAR ROM:   AROM eval  Flexion 70  Extension 10  Right lateral flexion 75% limited  Left lateral  flexion 75% limited  Right rotation 50% limited  Left rotation 50% limited   (Blank rows = not tested)  LOWER EXTREMITY ROM: WFL for activities assessed  LOWER EXTREMITY MMT:    MMT Right  eval Left eval  Hip flexion 3/5 3+/5  Hip extension    Hip abduction    Hip adduction    Hip internal rotation    Hip external rotation    Knee flexion 4/5 4/5  Knee extension 4/5 4+/5  Ankle dorsiflexion 4/5 4/5  Ankle plantarflexion    Ankle inversion    Ankle eversion     (Blank rows = not tested)  FUNCTIONAL TESTS:  5 times sit to stand: 18.28 seconds w/o UE support  Timed up and go (TUG): 14.97 seconds  GAIT: Assistive device utilized: None Level of assistance: SBA Comments: Decreased gait speed, stride length, hip extension bilaterally, and increased lateral sway  TODAY'S TREATMENT:                                                                                                                              DATE:                                     12/1 EXERCISE LOG  Exercise Repetitions and Resistance Comments  Nustep L3 x 15 minutes   LAQ 3 minutes   Isometric ball press 2 minutes w/ 5 second hold   Ball roll out  2.5 minutes   Isometric hip ADD  3 minutes w/ 5 second hold    Marching on foam 2.5 minutes   Rocker board (seated)  4 minutes    Blank cell = exercise not performed today                                    11/28 EXERCISE LOG  Exercise Repetitions and Resistance Comments  Standing heel/toe raises  2 minutes   Marching on foam  2 minutes   Standing hip extension 2 minutes  Alternating LE   Lateral step up  4" step; 2 minutes   LAQ 2.5 minutes;  Alternating LE  Nustep  L3 x 10 minutes   Resisted pull down  Blue XTS x 2 minutes   Resisted clams Green t-band x 3 minutes        Blank cell = exercise not performed today   PATIENT EDUCATION:  Education details: Plan of care, goals for therapy, and objective results Person educated: Patient Education method:  Explanation Education comprehension: verbalized understanding  HOME EXERCISE PROGRAM:   ASSESSMENT:  CLINICAL IMPRESSION: Patient was progressed multiple new and familiar interventions for improved lower extremity strength with moderate difficulty and fatigue. She required minimal cueing with today's interventions for proper pacing to avoid a significant increase in fatigue. However, fatigue was her primarily limitation as she required multiple seated rest breaks throughout treatment. She reported feeling tired upon the conclusion of treatment. She continues to require skilled physical therapy to address her remaining impairments to maximize her functional mobility  and safety.     OBJECTIVE IMPAIRMENTS: Abnormal gait, decreased balance, decreased mobility, difficulty walking, decreased ROM, decreased strength, hypomobility, impaired sensation, impaired tone, postural dysfunction, and pain.   ACTIVITY LIMITATIONS: standing, stairs, and locomotion level  PARTICIPATION LIMITATIONS: meal prep, cleaning, laundry, shopping, community activity, occupation, and church  PERSONAL FACTORS: Profession and 3+ comorbidities: Allergies, hypertension, diabetes, atrial fibrillation, obesity, arthritis  are also affecting patient's functional outcome.   REHAB POTENTIAL: Fair    CLINICAL DECISION MAKING: Evolving/moderate complexity  EVALUATION COMPLEXITY: Moderate   GOALS: Goals reviewed with patient? Yes  LONG TERM GOALS: Target date: 05/19/22  Patient will be independent with her HEP. Baseline:  Goal status: INITIAL  2.  Patient will improve her 5 times sit to stand to 12 seconds or less. Baseline:  Goal status: INITIAL  3.  Patient will improve her timed up and go to 12 seconds or less for improved safety. Baseline:  Goal status: INITIAL  4.  Patient will improve her bilateral hip flexion strength to at least 4/5. Baseline:  Goal status: INITIAL   PLAN:  PT FREQUENCY: 2x/week  PT  DURATION: 4 weeks  PLANNED INTERVENTIONS: Therapeutic exercises, Therapeutic activity, Neuromuscular re-education, Balance training, Gait training, Patient/Family education, Self Care, Joint mobilization, Stair training, Dry Needling, Electrical stimulation, Spinal mobilization, Cryotherapy, Moist heat, Traction, Manual therapy, and Re-evaluation.  PLAN FOR NEXT SESSION: NuStep, lower extremity strengthening, balance interventions, and modalities as needed   Darlin Coco, PT 05/01/2022, 12:32 PM

## 2022-05-04 ENCOUNTER — Ambulatory Visit: Payer: Medicare Other | Admitting: Physical Therapy

## 2022-05-04 ENCOUNTER — Encounter: Payer: Self-pay | Admitting: Physical Therapy

## 2022-05-04 DIAGNOSIS — Z9181 History of falling: Secondary | ICD-10-CM | POA: Diagnosis not present

## 2022-05-04 DIAGNOSIS — M6281 Muscle weakness (generalized): Secondary | ICD-10-CM

## 2022-05-04 DIAGNOSIS — M79651 Pain in right thigh: Secondary | ICD-10-CM | POA: Diagnosis not present

## 2022-05-04 NOTE — Therapy (Signed)
OUTPATIENT PHYSICAL THERAPY THORACOLUMBAR TREATMENT   Patient Name: Emily [name] MRN: [id number] DOB:02/17/1951, 71 y.o., female Today's Date: 05/04/2022  END OF SESSION:  PT End of Session - 05/04/22 0950     Visit Number 4    Number of Visits 8    Date for PT Re-Evaluation 05/22/22    PT Start Time 0947    PT Stop Time 1028    PT Time Calculation (min) 41 min    Activity Tolerance Patient tolerated treatment well    Behavior During Therapy [facility] for tasks assessed/performed            Past Medical History:  Diagnosis Date   Anemia    Arthritis    Atrial fibrillation, rapid ([place]) 10/27/2013   Bilateral carotid artery disease ([place])    Diabetes mellitus without complication ([place])    Difficult intubation    states 'lady that did the sleep study told me I have the smallest airway she has ever seen in an adult"   Dyslipidemia 06/29/2017   Dysrhythmia    Hypertension    Hypothyroid    Obesity    Obstructive sleep apnea    on C Pap   PAF (paroxysmal atrial fibrillation) ([place])    a. newly dx in 09/2013; on eliquis   Past Surgical History:  Procedure Laterality Date   CATARACT EXTRACTION Left    CATARACT EXTRACTION W/PHACO Right 12/09/2015   Procedure: CATARACT EXTRACTION PHACO AND INTRAOCULAR LENS PLACEMENT ([place]);  Surgeon: Tonny Branch, MD;  Location: [facility];  Service: Ophthalmology;  Laterality: Right;  CDE:10.48   COLONOSCOPY W/ POLYPECTOMY     cyst on back of neck removed     DILATION AND CURETTAGE OF UTERUS     x 5   ENDARTERECTOMY Left 08/30/2015   Procedure: LEFT CAROYID ENDARTERECTOMY WITH XENOSURE BOVINE PERICARDIUM PATCH ANGIOPLASTY;  Surgeon: [name], MD;  Location: [place];  Service: Vascular;  Laterality: Left;   EYE SURGERY     TONSILLECTOMY     UPPER GI ENDOSCOPY     Patient Active Problem List   Diagnosis Date Noted   Acute lower UTI    Atrial fibrillation with RVR ([place]) 08/30/2020   Chest pain 08/30/2020   Nonrheumatic aortic valve stenosis  03/19/2020   Persistent atrial fibrillation ([place]) 01/16/2020   Bilateral carotid artery stenosis 10/12/2019   Educated about COVID-19 virus infection 10/12/2019   Acute on chronic blood loss anemia 09/25/2019   OSA on CPAP 09/25/2019   Upper GI bleed 09/25/2019   History of adenomatous polyp of colon 08/30/2019   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 device 07/16/2018   Chronic anticoagulation 03/03/2018   Melena 03/03/2018   Restless leg syndrome 02/16/2018   Chronic left-sided low back pain with left-sided sciatica 02/16/2018   Morbid obesity ([place]) 02/14/2018   Bruit 02/14/2018   PAF (paroxysmal atrial fibrillation) ([place]) 02/14/2018   DDD (degenerative disc disease), lumbar 12/07/2017   Peripheral arterial disease ([place]) 07/06/2017   Type 2 diabetes mellitus with hyperlipidemia ([place]) 06/29/2017   Iron deficiency anemia 05/27/2017   Vitamin B12 deficiency 05/27/2017   Pain in joint, ankle and foot 06/03/2016   Carotid stenosis 08/30/2015   Bilateral carotid artery disease ([place]) 07/05/2015   Acute bronchitis 01/31/2015   Multiple gastric polyps 07/03/2014   Diabetes mellitus without complication ([place])    Hypertension associated with diabetes ([place]) 10/27/2013   Hypothyroidism due to acquired atrophy of thyroid 10/27/2013   Hypothyroidism 06/06/2010   Nonspecific elevation  of levels of transaminase or lactic acid dehydrogenase (LDH) 02/14/2007   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[place]) 02/14/2007   Cellulitis and abscess of finger, unspecified 01/17/2007   PCP: Janora Norlander, DO  REFERRING PROVIDER: Franki Monte, FNP   REFERRING DIAG: Radiculopathy, lumbar region   Rationale for Evaluation and Treatment: Rehabilitation  THERAPY DIAG:  Muscle weakness (generalized)  History of falling  Pain in right thigh  ONSET DATE: September 2023  SUBJECTIVE:                                                                                                                                                                                            SUBJECTIVE STATEMENT: Denies any new falls or pain.  PERTINENT HISTORY:  Allergies, hypertension, diabetes, atrial fibrillation, obesity, arthritis  PAIN: None  PRECAUTIONS: Fall  WEIGHT BEARING RESTRICTIONS: No  PATIENT GOALS: get a MRI, get normal sensation in her right thigh, and reduce fall risk   NEXT MD VISIT: to be scheduled after therapy  OBJECTIVE: all objective measures were assessed at her initial evaluation on 04/21/22 unless otherwise noted  DIAGNOSTIC FINDINGS: Lumbar x-ray on 03/17/22 IMPRESSION: No acute fracture or traumatic malalignment.  LUMBAR ROM:   AROM eval  Flexion 70  Extension 10  Right lateral flexion 75% limited  Left lateral flexion 75% limited  Right rotation 50% limited  Left rotation 50% limited   (Blank rows = not tested)  LOWER EXTREMITY ROM: WFL for activities assessed  LOWER EXTREMITY MMT:    MMT Right eval Left eval  Hip flexion 3/5 3+/5  Hip extension    Hip abduction    Hip adduction    Hip internal rotation    Hip external rotation    Knee flexion 4/5 4/5  Knee extension 4/5 4+/5  Ankle dorsiflexion 4/5 4/5  Ankle plantarflexion    Ankle inversion    Ankle eversion     (Blank rows = not tested)  FUNCTIONAL TESTS:  5 times sit to stand: 18.28 seconds w/o UE support  Timed up and go (TUG): 14.97 seconds  TODAY'S TREATMENT:  DATE:  12/4                                   12/4 EXERCISE LOG  Exercise Repetitions and Resistance Comments  Nustep L4 x 19 minutes   Heel/toes raises X20 reps   Hip abduction X15 reps   LAQ 3# x20 reps   Hip marching  Seated 3# x20 reps   Clam  Red x20 reps   Sit to stands X15 reps/ no UE support    Blank cell = exercise not performed today   PATIENT EDUCATION:  Education details:  Plan of care, goals for therapy, and objective results Person educated: Patient Education method: Explanation Education comprehension: verbalized understanding  HOME EXERCISE PROGRAM:  ASSESSMENT:  CLINICAL IMPRESSION: Patient presented in clinic with reports of no pain or new falls. Patient continues to experience the RLE numbness to anterior thigh. Patient able to complete therex but fatigues quickly in standing. Patient states that later her legs may be tired but is okay in sitting. No complaints following end of treatment.  OBJECTIVE IMPAIRMENTS: Abnormal gait, decreased balance, decreased mobility, difficulty walking, decreased ROM, decreased strength, hypomobility, impaired sensation, impaired tone, postural dysfunction, and pain.   ACTIVITY LIMITATIONS: standing, stairs, and locomotion level  PARTICIPATION LIMITATIONS: meal prep, cleaning, laundry, shopping, community activity, occupation, and church  PERSONAL FACTORS: Profession and 3+ comorbidities: Allergies, hypertension, diabetes, atrial fibrillation, obesity, arthritis  are also affecting patient's functional outcome.   REHAB POTENTIAL: Fair    CLINICAL DECISION MAKING: Evolving/moderate complexity  EVALUATION COMPLEXITY: Moderate  GOALS: Goals reviewed with patient? Yes  LONG TERM GOALS: Target date: 05/19/22  Patient will be independent with her HEP. Baseline:  Goal status: INITIAL  2.  Patient will improve her 5 times sit to stand to 12 seconds or less. Baseline:  Goal status: INITIAL  3.  Patient will improve her timed up and go to 12 seconds or less for improved safety. Baseline:  Goal status: INITIAL  4.  Patient will improve her bilateral hip flexion strength to at least 4/5. Baseline:  Goal status: INITIAL  PLAN:  PT FREQUENCY: 2x/week  PT DURATION: 4 weeks  PLANNED INTERVENTIONS: Therapeutic exercises, Therapeutic activity, Neuromuscular re-education, Balance training, Gait training,  Patient/Family education, Self Care, Joint mobilization, Stair training, Dry Needling, Electrical stimulation, Spinal mobilization, Cryotherapy, Moist heat, Traction, Manual therapy, and Re-evaluation.  PLAN FOR NEXT SESSION: NuStep, lower extremity strengthening, balance interventions, and modalities as needed   Standley Brooking, PTA 05/04/2022, 10:46 AM

## 2022-05-06 ENCOUNTER — Ambulatory Visit: Payer: Medicare Other

## 2022-05-06 DIAGNOSIS — M79651 Pain in right thigh: Secondary | ICD-10-CM | POA: Diagnosis not present

## 2022-05-06 DIAGNOSIS — M6281 Muscle weakness (generalized): Secondary | ICD-10-CM | POA: Diagnosis not present

## 2022-05-06 DIAGNOSIS — Z9181 History of falling: Secondary | ICD-10-CM

## 2022-05-06 NOTE — Therapy (Addendum)
OUTPATIENT PHYSICAL THERAPY THORACOLUMBAR TREATMENT   Patient Name: Emily [name] MRN: [id number] DOB:09-16-1950, 71 y.o., female Today's Date: 05/06/2022  END OF SESSION:  PT End of Session - 05/06/22 0909     Visit Number 5    Number of Visits 8    Date for PT Re-Evaluation 05/22/22    PT Start Time 0900    PT Stop Time 0943    PT Time Calculation (min) 43 min    Activity Tolerance Patient tolerated treatment well    Behavior During Therapy [facility] for tasks assessed/performed            Past Medical History:  Diagnosis Date   Anemia    Arthritis    Atrial fibrillation, rapid ([place]) 10/27/2013   Bilateral carotid artery disease ([place])    Diabetes mellitus without complication ([place])    Difficult intubation    states 'lady that did the sleep study told me I have the smallest airway she has ever seen in an adult"   Dyslipidemia 06/29/2017   Dysrhythmia    Hypertension    Hypothyroid    Obesity    Obstructive sleep apnea    on C Pap   PAF (paroxysmal atrial fibrillation) ([place])    a. newly dx in 09/2013; on eliquis   Past Surgical History:  Procedure Laterality Date   CATARACT EXTRACTION Left    CATARACT EXTRACTION W/PHACO Right 12/09/2015   Procedure: CATARACT EXTRACTION PHACO AND INTRAOCULAR LENS PLACEMENT ([place]);  Surgeon: Tonny Branch, MD;  Location: [facility];  Service: Ophthalmology;  Laterality: Right;  CDE:10.48   COLONOSCOPY W/ POLYPECTOMY     cyst on back of neck removed     DILATION AND CURETTAGE OF UTERUS     x 5   ENDARTERECTOMY Left 08/30/2015   Procedure: LEFT CAROYID ENDARTERECTOMY WITH XENOSURE BOVINE PERICARDIUM PATCH ANGIOPLASTY;  Surgeon: [name], MD;  Location: [place];  Service: Vascular;  Laterality: Left;   EYE SURGERY     TONSILLECTOMY     UPPER GI ENDOSCOPY     Patient Active Problem List   Diagnosis Date Noted   Acute lower UTI    Atrial fibrillation with RVR ([place]) 08/30/2020   Chest pain 08/30/2020   Nonrheumatic aortic valve stenosis  03/19/2020   Persistent atrial fibrillation ([place]) 01/16/2020   Bilateral carotid artery stenosis 10/12/2019   Educated about COVID-19 virus infection 10/12/2019   Acute on chronic blood loss anemia 09/25/2019   OSA on CPAP 09/25/2019   Upper GI bleed 09/25/2019   History of adenomatous polyp of colon 08/30/2019   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 device 07/16/2018   Chronic anticoagulation 03/03/2018   Melena 03/03/2018   Restless leg syndrome 02/16/2018   Chronic left-sided low back pain with left-sided sciatica 02/16/2018   Morbid obesity ([place]) 02/14/2018   Bruit 02/14/2018   PAF (paroxysmal atrial fibrillation) ([place]) 02/14/2018   DDD (degenerative disc disease), lumbar 12/07/2017   Peripheral arterial disease ([place]) 07/06/2017   Type 2 diabetes mellitus with hyperlipidemia ([place]) 06/29/2017   Iron deficiency anemia 05/27/2017   Vitamin B12 deficiency 05/27/2017   Pain in joint, ankle and foot 06/03/2016   Carotid stenosis 08/30/2015   Bilateral carotid artery disease ([place]) 07/05/2015   Acute bronchitis 01/31/2015   Multiple gastric polyps 07/03/2014   Diabetes mellitus without complication ([place])    Hypertension associated with diabetes ([place]) 10/27/2013   Hypothyroidism due to acquired atrophy of thyroid 10/27/2013   Hypothyroidism 06/06/2010   Nonspecific elevation  of levels of transaminase or lactic acid dehydrogenase (LDH) 02/14/2007   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[place]) 02/14/2007   Cellulitis and abscess of finger, unspecified 01/17/2007   PCP: Janora Norlander, DO  REFERRING PROVIDER: Franki Monte, FNP   REFERRING DIAG: Radiculopathy, lumbar region   Rationale for Evaluation and Treatment: Rehabilitation  THERAPY DIAG:  Muscle weakness (generalized)  History of falling  Pain in right thigh  ONSET DATE: September 2023  SUBJECTIVE:                                                                                                                                                                                            SUBJECTIVE STATEMENT: Patient reports that her thigh feels about the same as what it has been.   PERTINENT HISTORY:  Allergies, hypertension, diabetes, atrial fibrillation, obesity, arthritis  PAIN: None  PRECAUTIONS: Fall  WEIGHT BEARING RESTRICTIONS: No  PATIENT GOALS: get a MRI, get normal sensation in her right thigh, and reduce fall risk   NEXT MD VISIT: to be scheduled after therapy  OBJECTIVE: all objective measures were assessed at her initial evaluation on 04/21/22 unless otherwise noted  DIAGNOSTIC FINDINGS: Lumbar x-ray on 03/17/22 IMPRESSION: No acute fracture or traumatic malalignment.  LUMBAR ROM:   AROM eval  Flexion 70  Extension 10  Right lateral flexion 75% limited  Left lateral flexion 75% limited  Right rotation 50% limited  Left rotation 50% limited   (Blank rows = not tested)  LOWER EXTREMITY ROM: WFL for activities assessed  LOWER EXTREMITY MMT:    MMT Right eval Left eval  Hip flexion 3/5 3+/5  Hip extension    Hip abduction    Hip adduction    Hip internal rotation    Hip external rotation    Knee flexion 4/5 4/5  Knee extension 4/5 4+/5  Ankle dorsiflexion 4/5 4/5  Ankle plantarflexion    Ankle inversion    Ankle eversion     (Blank rows = not tested)  FUNCTIONAL TESTS:  5 times sit to stand: 18.28 seconds w/o UE support  Timed up and go (TUG): 14.97 seconds  TODAY'S TREATMENT:  DATE:                                     12/6 EXERCISE LOG  Exercise Repetitions and Resistance Comments  Nustep  L4 x 15 minutes   Rocker board 5 minutes   Resisted pull down Green t-band x 25 reps   LAQ 3# x 3 minutes        Blank cell = exercise not performed today  Manual Therapy Manual Traction: RLE long axis distraction, temporarily  effective at reducing her familiar symptoms                                     12/4 EXERCISE LOG  Exercise Repetitions and Resistance Comments  Nustep L4 x 19 minutes   Heel/toes raises X20 reps   Hip abduction X15 reps   LAQ 3# x20 reps   Hip marching  Seated 3# x20 reps   Clam  Red x20 reps   Sit to stands X15 reps/ no UE support    Blank cell = exercise not performed today   PATIENT EDUCATION:  Education details: Plan of care, goals for therapy, and objective results Person educated: Patient Education method: Explanation Education comprehension: verbalized understanding  HOME EXERCISE PROGRAM:  ASSESSMENT:  CLINICAL IMPRESSION: Treatment focused on familiar interventions for improved lower extremity strength with moderate difficulty and fatigue. Manual therapy focused on long axis distraction with this being temporarily effective at reducing her familiar symptoms. However, she reported that her symptoms returned to baseline when she returned to standing. She may benefit from additional medical intervention due to her continued right thigh symptoms.   PHYSICAL THERAPY DISCHARGE SUMMARY  Visits from Start of Care: 5  Current functional level related to goals / functional outcomes: Patient was unable to meet her goals for skilled physical therapy.    Remaining deficits: Pain   Education / Equipment: HEP   Patient agrees to discharge. Patient goals were not met. Patient is being discharged due to not returning since the last visit.  Candi Leash, PT, DPT    OBJECTIVE IMPAIRMENTS: Abnormal gait, decreased balance, decreased mobility, difficulty walking, decreased ROM, decreased strength, hypomobility, impaired sensation, impaired tone, postural dysfunction, and pain.   ACTIVITY LIMITATIONS: standing, stairs, and locomotion level  PARTICIPATION LIMITATIONS: meal prep, cleaning, laundry, shopping, community activity, occupation, and church  PERSONAL FACTORS: Profession  and 3+ comorbidities: Allergies, hypertension, diabetes, atrial fibrillation, obesity, arthritis  are also affecting patient's functional outcome.   REHAB POTENTIAL: Fair    CLINICAL DECISION MAKING: Evolving/moderate complexity  EVALUATION COMPLEXITY: Moderate  GOALS: Goals reviewed with patient? Yes  LONG TERM GOALS: Target date: 05/19/22  Patient will be independent with her HEP. Baseline:  Goal status: INITIAL  2.  Patient will improve her 5 times sit to stand to 12 seconds or less. Baseline:  Goal status: INITIAL  3.  Patient will improve her timed up and go to 12 seconds or less for improved safety. Baseline:  Goal status: INITIAL  4.  Patient will improve her bilateral hip flexion strength to at least 4/5. Baseline:  Goal status: INITIAL  PLAN:  PT FREQUENCY: 2x/week  PT DURATION: 4 weeks  PLANNED INTERVENTIONS: Therapeutic exercises, Therapeutic activity, Neuromuscular re-education, Balance training, Gait training, Patient/Family education, Self Care, Joint mobilization, Stair training, Dry Needling, Electrical stimulation, Spinal mobilization, Cryotherapy,  Moist heat, Traction, Manual therapy, and Re-evaluation.  PLAN FOR NEXT SESSION: NuStep, lower extremity strengthening, balance interventions, and modalities as needed   Granville Lewis, PT 05/06/2022, 10:05 AM

## 2022-05-11 ENCOUNTER — Telehealth: Payer: Self-pay | Admitting: Family Medicine

## 2022-05-11 ENCOUNTER — Other Ambulatory Visit: Payer: Self-pay | Admitting: Cardiology

## 2022-05-12 ENCOUNTER — Ambulatory Visit (INDEPENDENT_AMBULATORY_CARE_PROVIDER_SITE_OTHER): Payer: Medicare Other

## 2022-05-12 DIAGNOSIS — E538 Deficiency of other specified B group vitamins: Secondary | ICD-10-CM | POA: Diagnosis not present

## 2022-05-12 NOTE — Progress Notes (Signed)
Cyanocobalamin injection given to right deltoid.  Patient tolerated well. 

## 2022-05-12 NOTE — Progress Notes (Signed)
[facility] Quality Team Note  Name: Emily [name] Date of Birth: 1950-12-28 MRN: [id number] Date: 05/12/2022  [facility] Quality Team has reviewed this patient's chart, please see recommendations below:  [facility] Quality Other; (KED: Kidney Health Evaluation Gap- Patient needs Urine Albumin Creatinine Ratio Test completed for gap closure. EGFR has already been completed).

## 2022-05-13 ENCOUNTER — Telehealth: Payer: Self-pay | Admitting: Family Medicine

## 2022-05-19 ENCOUNTER — Telehealth: Payer: Medicare Other

## 2022-05-20 NOTE — Telephone Encounter (Signed)
Faxed novo nordisk pcp pages to office.

## 2022-05-28 ENCOUNTER — Other Ambulatory Visit: Payer: Self-pay | Admitting: Cardiology

## 2022-05-31 ENCOUNTER — Other Ambulatory Visit: Payer: Self-pay | Admitting: Family Medicine

## 2022-06-09 ENCOUNTER — Ambulatory Visit (INDEPENDENT_AMBULATORY_CARE_PROVIDER_SITE_OTHER): Payer: Medicare Other | Admitting: Family Medicine

## 2022-06-09 ENCOUNTER — Encounter: Payer: Self-pay | Admitting: Family Medicine

## 2022-06-09 ENCOUNTER — Other Ambulatory Visit: Payer: Self-pay | Admitting: Family Medicine

## 2022-06-09 VITALS — BP 132/71 | HR 65 | Temp 98.1°F | Ht 63.0 in | Wt 212.6 lb

## 2022-06-09 DIAGNOSIS — E538 Deficiency of other specified B group vitamins: Secondary | ICD-10-CM | POA: Diagnosis not present

## 2022-06-09 DIAGNOSIS — R197 Diarrhea, unspecified: Secondary | ICD-10-CM | POA: Diagnosis not present

## 2022-06-09 DIAGNOSIS — E785 Hyperlipidemia, unspecified: Secondary | ICD-10-CM

## 2022-06-09 DIAGNOSIS — I480 Paroxysmal atrial fibrillation: Secondary | ICD-10-CM | POA: Diagnosis not present

## 2022-06-09 DIAGNOSIS — Z23 Encounter for immunization: Secondary | ICD-10-CM

## 2022-06-09 DIAGNOSIS — E034 Atrophy of thyroid (acquired): Secondary | ICD-10-CM

## 2022-06-09 DIAGNOSIS — E1159 Type 2 diabetes mellitus with other circulatory complications: Secondary | ICD-10-CM

## 2022-06-09 DIAGNOSIS — M5416 Radiculopathy, lumbar region: Secondary | ICD-10-CM | POA: Diagnosis not present

## 2022-06-09 DIAGNOSIS — I152 Hypertension secondary to endocrine disorders: Secondary | ICD-10-CM

## 2022-06-09 DIAGNOSIS — E1169 Type 2 diabetes mellitus with other specified complication: Secondary | ICD-10-CM | POA: Diagnosis not present

## 2022-06-09 LAB — BAYER DCA HB A1C WAIVED: HB A1C (BAYER DCA - WAIVED): 6.1 % — ABNORMAL HIGH (ref 4.8–5.6)

## 2022-06-09 MED ORDER — SEMAGLUTIDE(0.25 OR 0.5MG/DOS) 2 MG/3ML ~~LOC~~ SOPN: 0.5000 mg | PEN_INJECTOR | SUBCUTANEOUS | Status: DC

## 2022-06-09 MED ORDER — METFORMIN HCL 1000 MG PO TABS: 500.0000 mg | ORAL_TABLET | Freq: Two times a day (BID) | ORAL | 3 refills | Status: DC

## 2022-06-09 MED ORDER — GABAPENTIN 300 MG PO CAPS: 300.0000 mg | ORAL_CAPSULE | Freq: Two times a day (BID) | ORAL | 2 refills | Status: DC | PRN

## 2022-06-09 MED ORDER — HYDROCODONE-ACETAMINOPHEN 5-325 MG PO TABS: 1.0000 | ORAL_TABLET | Freq: Four times a day (QID) | ORAL | 0 refills | Status: DC | PRN

## 2022-06-09 NOTE — Patient Instructions (Signed)
Increase Ozempic to 0.'5mg'$  injected under the skin every 7 days REDUCE the Metformin to '500mg'$  twice daily (this will be 1/2 tablet twice daily).

## 2022-06-09 NOTE — Progress Notes (Signed)
Subjective: CC:DM PCP: Janora Norlander, DO XBL:[id number] Emily [name] is a 72 y.o. female presenting to clinic today for:  1. Type 2 Diabetes with hypertension, hyperlipidemia:  She reports compliance with metformin 1000 mg twice daily, Ozempic 0.25 mg subcutaneously each week and glipizide.  No hypoglycemic episodes reported.  No nausea or vomiting but she has had several episodes of sudden need to defecate and this sometimes did not wait to she can get to the restroom.  She discontinued her stool softener and that seemed to help.  Last eye exam: Needs Last foot exam: Up-to-date Last A1c:  Lab Results  Component Value Date   HGBA1C 6.0 (H) 02/03/2022   Nephropathy screen indicated?:  Needs Last flu, zoster and/or pneumovax:  Immunization History  Administered Date(s) Administered   Fluad Quad(high Dose 65+) 03/24/2016, 02/28/2019, 03/05/2020   Influenza, High Dose Seasonal PF 03/24/2016, 03/22/2018   Influenza, Seasonal, Injecte, Preservative Fre 06/05/2014, 07/02/2015   Influenza,inj,Quad PF,6+ Mos 06/05/2014, 07/02/2015   Influenza-Unspecified 03/24/2016   PFIZER(Purple Top)SARS-COV-2 Vaccination 07/10/2019, 08/03/2019, 03/13/2020   Pneumococcal Conjugate-13 07/09/2015   Pneumococcal Polysaccharide-23 01/30/2014, 02/28/2019   Tdap 05/26/2014    ROS: No chest pain, shortness of breath  2. Afib Denies any heart palpitations.  Not on anticoagulation anymore due to history of GI bleed  3. Hypothyroidism Compliant with medications.  No reports of tremor but reports some viral episodes as above  4.  Right leg/back pain Patient sustained a fall recently when she had tripped over something.  This exacerbated the back pain and right leg pain that she has been experiencing.  She notes hypersensitivity of that right leg and some numbness and tingling.  She is under the care of orthopedics and has an MRI scheduled at [facility] at the end of the month.  Has Robaxin on  hand.  Had Norco after her fall last month and that was helpful but she was only given 5 days of this.  Cannot take NSAIDs due to history of GI bleed and Tylenol has been unhelpful as well.   ROS: Per HPI  Allergies  Allergen Reactions   Jardiance [Empagliflozin] Other (See Comments)    RECURRENT VAGINITIS   Quinapril Hcl Cough   Statins Other (See Comments)    Not all Statins but some cause cough and pain in legs.   Tape Other (See Comments)    Redness, please use "paper" tape   Past Medical History:  Diagnosis Date   Anemia    Arthritis    Atrial fibrillation with RVR ([place]) 08/30/2020   Atrial fibrillation, rapid ([place]) 10/27/2013   Bilateral carotid artery disease (HCC)    Diabetes mellitus without complication ([place])    Difficult intubation    states 'lady that did the sleep study told me I have the smallest airway she has ever seen in an adult"   Dyslipidemia 06/29/2017   Dysrhythmia    Hypertension    Hypothyroid    Obesity    Obstructive sleep apnea    on C Pap   PAF (paroxysmal atrial fibrillation) ([place])    a. newly dx in 09/2013; on eliquis   Upper GI bleed 09/25/2019    Current Outpatient Medications:    aspirin EC 81 MG tablet, Take 81 mg by mouth daily., Disp: , Rfl:    bisacodyl (DULCOLAX) 5 MG EC tablet, Take 5 mg by mouth daily., Disp: , Rfl:    diltiazem (CARDIZEM CD) 180 MG 24 hr capsule, TAKE 1 CAPSULE BY  MOUTH EVERY DAY, Disp: 90 capsule, Rfl: 2   diltiazem (CARDIZEM) 30 MG tablet, Take 30 mg by mouth as needed., Disp: , Rfl:    ezetimibe (ZETIA) 10 MG tablet, TAKE 1 TABLET BY MOUTH EVERY DAY, Disp: 90 tablet, Rfl: 3   flecainide (TAMBOCOR) 100 MG tablet, TAKE 1 TABLET BY MOUTH TWICE A DAY, Disp: 180 tablet, Rfl: 3   furosemide (LASIX) 20 MG tablet, TAKE 1 TABLET (20 MG TOTAL) BY MOUTH DAILY AS NEEDED FOR FLUID., Disp: 90 tablet, Rfl: 0   glipiZIDE (GLUCOTROL) 5 MG tablet, Take 1 tablet (5 mg total) by mouth 2 (two) times daily., Disp: 180 tablet, Rfl: 1    ketoconazole (NIZORAL) 2 % cream, Apply 1 application topically daily. Prn intertrigo rash, Disp: 60 g, Rfl: 1   levothyroxine (SYNTHROID) 112 MCG tablet, TAKE 1 TABLET BY MOUTH EVERY OTHER DAY, ALTERNATING WITH 1 & 1/2 TABLETS EVERY OTHER DAY, Disp: 112 tablet, Rfl: 3   losartan (COZAAR) 25 MG tablet, TAKE 1 TABLET (25 MG TOTAL) BY MOUTH DAILY., Disp: 90 tablet, Rfl: 3   lovastatin (MEVACOR) 40 MG tablet, TAKE 1 TABLET BY MOUTH EVERY DAY IN THE EVENING, Disp: 90 tablet, Rfl: 3   metFORMIN (GLUCOPHAGE) 1000 MG tablet, TAKE 1 TABLET (1,000 MG TOTAL) BY MOUTH TWICE A DAY WITH FOOD, Disp: 180 tablet, Rfl: 0   methocarbamol (ROBAXIN) 500 MG tablet, Take 1 tablet (500 mg total) by mouth every 8 (eight) hours as needed for muscle spasms., Disp: 30 tablet, Rfl: 0   metoprolol succinate (TOPROL-XL) 100 MG 24 hr tablet, TAKE 1 TABLET BY MOUTH EVERY DAY WITH OR IMMEDIATELY FOLLOWING A MEAL, Disp: 90 tablet, Rfl: 0   predniSONE (DELTASONE) 20 MG tablet, 2 po at same time daily for 5 days (Patient not taking: Reported on 04/21/2022), Disp: 10 tablet, Rfl: 0   rOPINIRole (REQUIP) 0.5 MG tablet, Take 1 tablet (0.5 mg total) by mouth at bedtime., Disp: 90 tablet, Rfl: 1   Semaglutide,0.25 or 0.'5MG'$ /DOS, 2 MG/3ML SOPN, Inject 0.25 mg into the skin once a week., Disp: 3 mL, Rfl:    triamcinolone cream (KENALOG) 0.1 %, Apply 1 Application topically 2 (two) times daily. (Patient not taking: Reported on 04/21/2022), Disp: 80 g, Rfl: 0  Current Facility-Administered Medications:    cyanocobalamin ((VITAMIN B-12)) injection 1,000 mcg, 1,000 mcg, Intramuscular, Q30 days, Timmothy Euler, MD, 1,000 mcg at 05/12/22 0940 Social History   Socioeconomic History   Marital status: Single    Spouse name: Not on file   Number of children: Not on file   Years of education: Not on file   Highest education level: Not on file  Occupational History   Not on file  Tobacco Use   Smoking status: Never   Smokeless tobacco:  Never  Vaping Use   Vaping Use: Never used  Substance and Sexual Activity   Alcohol use: No    Alcohol/week: 0.0 standard drinks of alcohol   Drug use: Never   Sexual activity: Never  Other Topics Concern   Not on file  Social History Narrative   Not on file   Social Determinants of Health   Financial Resource Strain: Low Risk  (04/21/2022)   Overall Financial Resource Strain (CARDIA)    Difficulty of Paying Living Expenses: Not hard at all  Food Insecurity: No Food Insecurity (04/21/2022)   Hunger Vital Sign    Worried About Running Out of Food in the Last Year: Never true    Ran Out  of Food in the Last Year: Never true  Transportation Needs: No Transportation Needs (04/21/2022)   PRAPARE - Hydrologist (Medical): No    Lack of Transportation (Non-Medical): No  Physical Activity: Inactive (04/21/2022)   Exercise Vital Sign    Days of Exercise per Week: 0 days    Minutes of Exercise per Session: 0 min  Stress: No Stress Concern Present (04/21/2022)   [place]    Feeling of Stress : Not at all  Social Connections: Moderately Integrated (04/21/2022)   Social Connection and Isolation Panel [NHANES]    Frequency of Communication with Friends and Family: More than three times a week    Frequency of Social Gatherings with Friends and Family: Twice a week    Attends Religious Services: More than 4 times per year    Active Member of [employer] or Organizations: Yes    Attends Music therapist: More than 4 times per year    Marital Status: Never married  Intimate Partner Violence: Not At Risk (12/21/2017)   Humiliation, Afraid, Rape, and Kick questionnaire    Fear of Current or Ex-Partner: No    Emotionally Abused: No    Physically Abused: No    Sexually Abused: No   Family History  Problem Relation Age of Onset   COPD Father    Heart failure Father    Heart disease Father     Emphysema Father    Arrhythmia Sister    Arrhythmia Sister    Arrhythmia Sister        had PPM also   Cancer Sister     Objective: Office vital signs reviewed. BP (!) 144/67   Pulse 65   Temp 98.1 F (36.7 C)   Ht '5\' 3"'$  (1.6 m)   Wt 212 lb 9.6 oz (96.4 kg)   SpO2 96%   BMI 37.66 kg/m   Physical Examination:  General: Awake, alert, morbidly obese, No acute distress HEENT: sclera white, MMM Cardio: regular rate and rhythm, K9X8 heard, systolic murmurs appreciated Pulm: clear to auscultation bilaterally, no wheezes, rhonchi or rales; normal work of breathing on room air MSK: antalgic gait and station; ambulating independently  Assessment/ Plan: 72 y.o. female   Type 2 diabetes mellitus with hyperlipidemia (HCC) - Plan: CMP14+EGFR, Bayer DCA Hb A1c Waived, Microalbumin / creatinine urine ratio, Semaglutide,0.25 or 0.'5MG'$ /DOS, 2 MG/3ML SOPN  Hypertension associated with diabetes ([place]) - Plan: CMP14+EGFR  Hyperlipidemia associated with type 2 diabetes mellitus ([place])  Hypothyroidism due to acquired atrophy of thyroid - Plan: CMP14+EGFR, TSH, T4, Free  PAF (paroxysmal atrial fibrillation) (HCC) - Plan: Magnesium, CMP14+EGFR, CBC  Diarrhea, unspecified type  Lumbar radiculopathy, right - Plan: gabapentin (NEURONTIN) 300 MG capsule, HYDROcodone-acetaminophen (NORCO) 5-325 MG tablet  Sugar remains under good control with A1c of 6.1 today.  Given diarrheal episodes she may decrease the metformin to 500 mg twice daily and I will raise her Ozempic to 0.5 mg weekly.  Follow-up in 4 months, sooner if concerns arise  Blood pressure is appropriate for age.  No changes.  Check renal function, liver enzymes  Continue current cholesterol regimen  Check thyroid levels.  Having some viral episodes but suspect that is more medication induced than uncontrolled hyperthyroidism  Check magnesium, CBC given history of A-fib  I gave her gabapentin to use.  She will start this at bedtime  and then may titrate to twice daily dosing if tolerated as well  as Norco for severe pain.  Discussed limitations of use of this medication.  Caution constipation and sedation.  National narcotic database reviewed and there were no red flags.  Do not anticipate need for UDS and CSC as this is not going to be a chronic medication for this patient  No orders of the defined types were placed in this encounter.  No orders of the defined types were placed in this encounter.    Janora Norlander, DO [place] [phone]

## 2022-06-09 NOTE — Addendum Note (Signed)
Addended by: Janora Norlander on: 06/09/2022 10:11 AM   Modules accepted: Level of Service

## 2022-06-10 ENCOUNTER — Telehealth: Payer: Self-pay | Admitting: Family Medicine

## 2022-06-10 ENCOUNTER — Ambulatory Visit: Payer: Medicare Other

## 2022-06-10 ENCOUNTER — Telehealth: Payer: Self-pay | Admitting: Pharmacist

## 2022-06-10 LAB — CBC
Hematocrit: 38.3 % (ref 34.0–46.6)
Hemoglobin: 12.5 g/dL (ref 11.1–15.9)
MCH: 30 pg (ref 26.6–33.0)
MCHC: 32.6 g/dL (ref 31.5–35.7)
MCV: 92 fL (ref 79–97)
Platelets: 250 10*3/uL (ref 150–450)
RBC: 4.17 x10E6/uL (ref 3.77–5.28)
RDW: 13.9 % (ref 11.7–15.4)
WBC: 7.4 10*3/uL (ref 3.4–10.8)

## 2022-06-10 LAB — CMP14+EGFR
ALT: 16 IU/L (ref 0–32)
AST: 19 IU/L (ref 0–40)
Albumin/Globulin Ratio: 2.2 (ref 1.2–2.2)
Albumin: 4.8 g/dL (ref 3.8–4.8)
Alkaline Phosphatase: 94 IU/L (ref 44–121)
BUN/Creatinine Ratio: 34 — ABNORMAL HIGH (ref 12–28)
BUN: 24 mg/dL (ref 8–27)
Bilirubin Total: 0.4 mg/dL (ref 0.0–1.2)
CO2: 20 mmol/L (ref 20–29)
Calcium: 9.5 mg/dL (ref 8.7–10.3)
Chloride: 104 mmol/L (ref 96–106)
Creatinine, Ser: 0.71 mg/dL (ref 0.57–1.00)
Globulin, Total: 2.2 g/dL (ref 1.5–4.5)
Glucose: 152 mg/dL — ABNORMAL HIGH (ref 70–99)
Potassium: 4.4 mmol/L (ref 3.5–5.2)
Sodium: 142 mmol/L (ref 134–144)
Total Protein: 7 g/dL (ref 6.0–8.5)
eGFR: 90 mL/min/{1.73_m2} (ref >59)

## 2022-06-10 LAB — T4, FREE: Free T4: 1.61 ng/dL (ref 0.82–1.77)

## 2022-06-10 LAB — MICROALBUMIN / CREATININE URINE RATIO
Creatinine, Urine: 143.4 mg/dL
Microalb/Creat Ratio: 97 mg/g creat — ABNORMAL HIGH (ref 0–29)
Microalbumin, Urine: 139.8 ug/mL

## 2022-06-10 LAB — TSH: TSH: 1.91 u[IU]/mL (ref 0.450–4.500)

## 2022-06-10 LAB — MAGNESIUM: Magnesium: 2 mg/dL (ref 1.6–2.3)

## 2022-06-10 NOTE — Telephone Encounter (Signed)
Please submit dose change request form to [facility] Ozempic 0.'5mg'$  sq weekly You can email me/fax PCP  Thanks!

## 2022-06-11 NOTE — Telephone Encounter (Signed)
Returned patients call.  Rec'd letter from [facility] regarding levemir and glucagon injection being discontinued - this letter was sent to everyone. However I did follow up on patients application via [facility] automated system and "name & address" are missing on application (these automated system updates are not the clearest). Informed patient I will resubmit her application although I'm sure this info was listed.

## 2022-06-11 NOTE — Telephone Encounter (Signed)
Can you touch base with patient? Her app was missing info? For ozempic

## 2022-06-12 NOTE — Telephone Encounter (Signed)
*  UPDATE*  Received notification from [facility] [place] regarding RE-ENROLLMENT approval for [place]. Patient assistance approved from 06/11/22 to 06/01/23.  MEDICATION WILL SHIP TO OFFICE.  Phone: [phone]

## 2022-06-15 ENCOUNTER — Telehealth: Payer: Self-pay | Admitting: Family Medicine

## 2022-06-15 NOTE — Telephone Encounter (Signed)
Patient is calling to check on ppw that she brought down for [place] before Christmas. Please call back and advise.

## 2022-06-16 ENCOUNTER — Ambulatory Visit (INDEPENDENT_AMBULATORY_CARE_PROVIDER_SITE_OTHER): Payer: Medicare Other | Admitting: *Deleted

## 2022-06-16 DIAGNOSIS — E1169 Type 2 diabetes mellitus with other specified complication: Secondary | ICD-10-CM

## 2022-06-16 DIAGNOSIS — I152 Hypertension secondary to endocrine disorders: Secondary | ICD-10-CM

## 2022-06-16 NOTE — Chronic Care Management (AMB) (Signed)
Chronic Care Management   CCM RN Visit Note  06/16/2022 Name: Emily [name] MRN: [id number] DOB: 08-20-1950  Subjective: Emily [name] is a 72 y.o. year old female who is a primary care patient of Janora Norlander, DO. The patient was referred to the Chronic Care Management team for assistance with care management needs subsequent to provider initiation of CCM services and plan of care.    Today's Visit:  Engaged with patient by telephone for follow up visit.        Goals Addressed             This Visit's Progress    CCM (DIABETES) EXPECTED OUTCOME:  MONITOR, SELF-MANAGE AND REDUCE SYMPTOMS OF DIABETES       Current Barriers:  Knowledge Deficits related to Diabetes management Chronic Disease Management support and education needs related to Diabetes and diet No Advanced Directives in place- information to be mailed per pt request Patient reports she does not check CBG, has glucometer and knows how to use, states AIC is good 6.1 on 06/09/22 and does not feel the need to check Patient reports she does not always follow a special diet, drinks mostly unsweetened tea, diet drinks and water Patient states she does not exercise, does continue to work out of her home  Planned Interventions: Reviewed medications with patient and discussed importance of medication adherence;        Reviewed prescribed diet with patient carbohydrate modified, plate method; Counseled on importance of regular laboratory monitoring as prescribed;        Discussed plans with patient for ongoing care management follow up and provided patient with direct contact information for care management team;      Review of patient status, including review of consultants reports, relevant laboratory and other test results, and medications completed;       Reinforced importance of exercise and benefit with blood sugar levels Reviewed upcoming scheduled appointments including MRI scheduled for 06/20/22 at  [facility]  Symptom Management: Take medications as prescribed   Attend all scheduled provider appointments Call pharmacy for medication refills 3-7 days in advance of running out of medications Attend church or other social activities Perform all self care activities independently  Perform IADL's (shopping, preparing meals, housekeeping, managing finances) independently Call provider office for new concerns or questions  check blood sugar at prescribed times: once daily check feet daily for cuts, sores or redness trim toenails straight across drink 6 to 8 glasses of water each day fill half of plate with vegetables limit fast food meals to no more than 1 per week manage portion size prepare main meal at home 3 to 5 days each week read food labels for fat, fiber, carbohydrates and portion size wash and dry feet carefully every day wear comfortable, cotton socks Be mindful of carbohydrate intake- too much bread, pasta, rice, potatoes at one meal will elevate blood sugar Avoid/ limit concentrated sweets  Follow Up Plan: Telephone follow up appointment with care management team member scheduled for:   08/18/22 at 9 am       CCM (HYPERTENSION) EXPECTED OUTCOME: MONITOR, SELF-MANAGE AND REDUCE SYMPTOMS OF HYPERTENSION       Current Barriers:  Knowledge Deficits related to Hypertension management Chronic Disease Management support and education needs related to Hypertension, diet, exercise No Advanced Directives in place- pt requests information be mailed Patient reports she lives alone, is independent with all aspects of her care, 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   Patient is finished attending outpatient PT for lower back/ spine issues, had 5-6 sessions and will now be having an MRI on 06/20/22 at [facility] Interventions: Evaluation of current treatment plan related to hypertension self management and patient's adherence to plan as established by provider;   Reviewed prescribed diet low sodium Reviewed medications with patient and discussed importance of compliance;  Counseled on the importance of exercise goals with target of 150 minutes per week Advised patient, providing education and rationale, to monitor blood pressure daily and record, calling PCP for findings outside established parameters;  Advised patient to discuss any issues with blood pressure, medications with provider; Provided education on prescribed diet carbohydrate modified;  Reinforced safety precautions  Symptom Management: Take medications as prescribed   Attend all scheduled provider appointments Call pharmacy for medication refills 3-7 days in advance of running out of medications Attend church or other social activities Perform all self care activities independently  Perform IADL's (shopping, preparing meals, housekeeping, managing finances) independently Call provider office for new concerns or questions  check blood pressure weekly choose a place to take my blood pressure (home, clinic or office, retail store) write blood pressure results in a log or diary keep a blood pressure log take blood pressure log to all doctor appointments call doctor for signs and symptoms of high blood pressure take medications for blood pressure exactly as prescribed begin an exercise program eat more whole grains, fruits and vegetables, lean meats and healthy fats Follow low sodium diet- read labels, avoid/ limit fast food Practice relaxation daily, keep stress to a minimum fall prevention strategies: change  position slowly, use assistive device such as walker or cane (per provider recommendations) when walking, keep walkways clear, have good lighting in room. It is important to contact your provider if you have any falls, maintain muscle strength/tone by exercise per provider recommendations.  Follow Up Plan: Telephone follow up appointment with care management team member scheduled for:  08/18/22 at 9 am          Plan:Telephone follow up appointment with care management team member scheduled for:  08/18/22 at 9 am  Jacqlyn Larsen [facility], BSN RN Case Manager [place] [phone]

## 2022-06-16 NOTE — Patient Instructions (Signed)
Please call the care guide team at [phone] if you need to cancel or reschedule your appointment.   If you are experiencing a Mental Health or [place] or need someone to talk to, please call the Suicide and Crisis Lifeline: 988 call the [place] National Suicide Prevention Lifeline: (878)181-5613 or TTY: 985-875-1752 TTY 2628531306) to talk to a trained counselor call 1-800-273-TALK (toll free, 24 hour hotline) go to [facility] [address], [place] [phone]) call the [facility]: [phone] call 911   Following is a copy of the CCM Program Consent:  CCM service includes personalized support from designated clinical staff supervised by the physician, including individualized plan of care and coordination with other care providers 24/7 contact phone numbers for assistance for urgent and routine care needs. Service will only be billed when office clinical staff spend 20 minutes or more in a month to coordinate care. Only one practitioner may furnish and bill the service in a calendar month. The patient may stop CCM services at amy time (effective at the end of the month) by phone call to the office staff. The patient will be responsible for cost sharing (co-pay) or up to 20% of the service fee (after annual deductible is met)  Following is a copy of your full provider care plan:   Goals Addressed             This Visit's Progress    CCM (DIABETES) EXPECTED OUTCOME:  MONITOR, SELF-MANAGE AND REDUCE SYMPTOMS OF DIABETES       Current Barriers:  Knowledge Deficits related to Diabetes management Chronic Disease Management support and education needs related to Diabetes and diet No Advanced Directives in place- information to be mailed per pt request Patient reports she does not check CBG, has glucometer and knows how to use, states AIC is good 6.1 on 06/09/22 and does not feel the need to check Patient  reports she does not always follow a special diet, drinks mostly unsweetened tea, diet drinks and water Patient states she does not exercise, does continue to work out of her home  Planned Interventions: Reviewed medications with patient and discussed importance of medication adherence;        Reviewed prescribed diet with patient carbohydrate modified, plate method; Counseled on importance of regular laboratory monitoring as prescribed;        Discussed plans with patient for ongoing care management follow up and provided patient with direct contact information for care management team;      Review of patient status, including review of consultants reports, relevant laboratory and other test results, and medications completed;       Reinforced importance of exercise and benefit with blood sugar levels Reviewed upcoming scheduled appointments including MRI scheduled for 06/20/22 at [facility]  Symptom Management: Take medications as prescribed   Attend all scheduled provider appointments Call pharmacy for medication refills 3-7 days in advance of running out of medications Attend church or other social activities Perform all self care activities independently  Perform IADL's (shopping, preparing meals, housekeeping, managing finances) independently Call provider office for new concerns or questions  check blood sugar at prescribed times: once daily check feet daily for cuts, sores or redness trim toenails straight across drink 6 to 8 glasses of water each day fill half of plate with vegetables limit fast food meals to no more than 1 per week manage portion size prepare main meal at home 3 to 5 days each week read  food labels for fat, fiber, carbohydrates and portion size wash and dry feet carefully every day wear comfortable, cotton socks Be mindful of carbohydrate intake- too much bread, pasta, rice, potatoes at one meal will elevate blood sugar Avoid/ limit concentrated sweets  Follow  Up Plan: Telephone follow up appointment with care management team member scheduled for:   08/18/22 at 9 am       CCM (HYPERTENSION) EXPECTED OUTCOME: MONITOR, SELF-MANAGE AND REDUCE SYMPTOMS OF HYPERTENSION       Current Barriers:  Knowledge Deficits related to Hypertension management Chronic Disease Management support and education needs related to Hypertension, diet, exercise No Advanced Directives in place- pt requests information be mailed Patient reports she lives alone, is independent with all aspects of her care, 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   Patient is finished attending outpatient PT for lower back/ spine issues, had 5-6 sessions and will now be having an MRI on 06/20/22 at [facility] Interventions: Evaluation of current treatment plan related to hypertension self management and patient's adherence to plan as established by provider;   Reviewed prescribed diet low sodium Reviewed medications with patient and discussed importance of compliance;  Counseled on the importance of exercise goals with target of 150 minutes per week Advised patient, providing education and rationale, to monitor blood pressure daily and record, calling PCP for findings outside established parameters;  Advised patient to discuss any issues with blood pressure, medications with provider; Provided education on prescribed diet carbohydrate modified;  Reinforced safety precautions  Symptom Management: Take medications as prescribed   Attend all scheduled provider appointments Call pharmacy for medication  refills 3-7 days in advance of running out of medications Attend church or other social activities Perform all self care activities independently  Perform IADL's (shopping, preparing meals, housekeeping, managing finances) independently Call provider office for new concerns or questions  check blood pressure weekly choose a place to take my blood pressure (home, clinic or office, retail store) write blood pressure results in a log or diary keep a blood pressure log take blood pressure log to all doctor appointments call doctor for signs and symptoms of high blood pressure take medications for blood pressure exactly as prescribed begin an exercise program eat more whole grains, fruits and vegetables, lean meats and healthy fats Follow low sodium diet- read labels, avoid/ limit fast food Practice relaxation daily, keep stress to a minimum fall prevention strategies: change position slowly, use assistive device such as walker or cane (per provider recommendations) when walking, keep walkways clear, have good lighting in room. It is important to contact your provider if you have any falls, maintain muscle strength/tone by exercise per provider recommendations.  Follow Up Plan: Telephone follow up appointment with care management team member scheduled for:  08/18/22 at 9 am          The patient verbalized understanding of instructions, educational materials, and care plan provided today and DECLINED offer to receive copy of patient instructions, educational materials, and care plan.   Telephone follow up appointment with  care management team member scheduled for:  08/18/22 at 9 am

## 2022-06-16 NOTE — Telephone Encounter (Signed)
Spoke with pt.   Informed her she is approved with [employer] and the office will reach out to her as soon as her shipment is in.

## 2022-06-20 DIAGNOSIS — M47816 Spondylosis without myelopathy or radiculopathy, lumbar region: Secondary | ICD-10-CM | POA: Diagnosis not present

## 2022-06-20 DIAGNOSIS — M48061 Spinal stenosis, lumbar region without neurogenic claudication: Secondary | ICD-10-CM | POA: Diagnosis not present

## 2022-06-24 ENCOUNTER — Telehealth: Payer: Self-pay | Admitting: *Deleted

## 2022-06-24 NOTE — Telephone Encounter (Signed)
Patient has an upcoming appointment at [facility] and vascular with Dr. Trula Slade. She had asked their office if they would be able to perform studies n her Right leg.  They said that another referral would have to be submitted by PCP specifically for lower extremity. I presume these are doppler tests. She requested that I ask if Dr. Lajuana Ripple would be willing to do this.

## 2022-06-24 NOTE — Telephone Encounter (Signed)
She has pulses in her leg and her pain started after she had a mechanical injury, so VERY low suspicion for vascular etiology.  Has there been a change since last visit?  Is she seeing blue feet/ swelling, etc?

## 2022-06-25 NOTE — Telephone Encounter (Signed)
Patient states that there has not been any changes in her leg since last visit.  No blue foot or swelling.  States it is from right knee up to thigh.  She has numbness in it, sensitive burning and sensitive to touch.  Patient states "it is a weird felling, I don't know how to explain it".  She is taking hydrocodone and when she does she has "good days".  States it has been going on since September. Has appointment Sunday with Dr. Nelva Bush.  Will send to PCP and covering provider to see if this can wait until PCP returns to office.

## 2022-06-26 NOTE — Telephone Encounter (Signed)
Patient aware and states she is going to see him Sunday.

## 2022-06-26 NOTE — Telephone Encounter (Signed)
Yes, again sounds neuropathic and I think Dr Nelva Bush will be able to help with this.  If he believes vascular issues are at play, I'm glad to add study but again based on her reports, I do not believe this to be the case.

## 2022-06-28 DIAGNOSIS — M5136 Other intervertebral disc degeneration, lumbar region: Secondary | ICD-10-CM | POA: Diagnosis not present

## 2022-06-29 ENCOUNTER — Other Ambulatory Visit: Payer: Self-pay | Admitting: *Deleted

## 2022-06-29 DIAGNOSIS — I6523 Occlusion and stenosis of bilateral carotid arteries: Secondary | ICD-10-CM

## 2022-07-01 DIAGNOSIS — E1159 Type 2 diabetes mellitus with other circulatory complications: Secondary | ICD-10-CM

## 2022-07-01 DIAGNOSIS — I10 Essential (primary) hypertension: Secondary | ICD-10-CM | POA: Diagnosis not present

## 2022-07-03 DIAGNOSIS — E119 Type 2 diabetes mellitus without complications: Secondary | ICD-10-CM | POA: Diagnosis not present

## 2022-07-03 DIAGNOSIS — K589 Irritable bowel syndrome without diarrhea: Secondary | ICD-10-CM | POA: Diagnosis not present

## 2022-07-03 DIAGNOSIS — K5900 Constipation, unspecified: Secondary | ICD-10-CM | POA: Diagnosis not present

## 2022-07-13 ENCOUNTER — Ambulatory Visit (HOSPITAL_COMMUNITY)
Admission: RE | Admit: 2022-07-13 | Discharge: 2022-07-13 | Disposition: A | Discharge status: Home / Self Care | Payer: Medicare Other | Source: Ambulatory Visit | Attending: Surgery | Admitting: Surgery

## 2022-07-13 ENCOUNTER — Ambulatory Visit: Payer: Medicare Other | Admitting: Physician Assistant

## 2022-07-13 VITALS — BP 156/67 | HR 60 | Temp 98.0°F | Resp 20 | Ht 63.0 in | Wt 219.9 lb

## 2022-07-13 DIAGNOSIS — I6523 Occlusion and stenosis of bilateral carotid arteries: Secondary | ICD-10-CM | POA: Diagnosis not present

## 2022-07-14 ENCOUNTER — Ambulatory Visit: Payer: Medicare Other

## 2022-07-14 ENCOUNTER — Ambulatory Visit (INDEPENDENT_AMBULATORY_CARE_PROVIDER_SITE_OTHER): Payer: Medicare Other

## 2022-07-14 DIAGNOSIS — E538 Deficiency of other specified B group vitamins: Secondary | ICD-10-CM | POA: Diagnosis not present

## 2022-07-14 NOTE — Progress Notes (Signed)
Established Carotid Patient   History of Present Illness   Emily [name] is a 72 y.o. (24-Nov-1950) female who presents for surveillance of carotid artery stenosis.  She has a history of left CEA for asymptomatic carotid artery stenosis in March 2017 by Dr. Trula Slade.  She continues to require 90-monthsurveillance for 60 to 79% right ICA stenosis.  At her last appointment she was doing well without neurologic symptoms.  She denied any claudication, rest pain, tissue loss.  She is doing well at today's visit.  She denies any strokelike symptoms such as amaurosis fugax, slurred speech, sudden onset of weakness/numbness, clumsiness, or facial droop.  She also denies claudication, rest pain, tissue loss.  She still takes her aspirin and statin.  Current Outpatient Medications  Medication Sig Dispense Refill   aspirin EC 81 MG tablet Take 81 mg by mouth daily.     bisacodyl (DULCOLAX) 5 MG EC tablet Take 5 mg by mouth daily.     diltiazem (CARDIZEM CD) 180 MG 24 hr capsule TAKE 1 CAPSULE BY MOUTH EVERY DAY 90 capsule 2   diltiazem (CARDIZEM) 30 MG tablet Take 30 mg by mouth as needed.     ezetimibe (ZETIA) 10 MG tablet TAKE 1 TABLET BY MOUTH EVERY DAY 90 tablet 3   flecainide (TAMBOCOR) 100 MG tablet TAKE 1 TABLET BY MOUTH TWICE A DAY 180 tablet 3   furosemide (LASIX) 20 MG tablet TAKE 1 TABLET (20 MG TOTAL) BY MOUTH DAILY AS NEEDED FOR FLUID. 90 tablet 0   gabapentin (NEURONTIN) 300 MG capsule Take 1 capsule (300 mg total) by mouth 2 (two) times daily as needed (neuropathy right leg). 180 capsule 2   glipiZIDE (GLUCOTROL) 5 MG tablet Take 1 tablet (5 mg total) by mouth 2 (two) times daily. 180 tablet 1   HYDROcodone-acetaminophen (NORCO) 5-325 MG tablet Take 1 tablet by mouth every 6 (six) hours as needed for severe pain. 20 tablet 0   ketoconazole (NIZORAL) 2 % cream Apply 1 application topically daily. Prn intertrigo rash 60 g 1   levothyroxine (SYNTHROID) 112 MCG tablet TAKE 1 TABLET BY  MOUTH EVERY OTHER DAY, ALTERNATING WITH 1 & 1/2 TABLETS EVERY OTHER DAY 112 tablet 3   losartan (COZAAR) 25 MG tablet TAKE 1 TABLET (25 MG TOTAL) BY MOUTH DAILY. 90 tablet 3   lovastatin (MEVACOR) 40 MG tablet TAKE 1 TABLET BY MOUTH EVERY DAY IN THE EVENING 90 tablet 3   metFORMIN (GLUCOPHAGE) 1000 MG tablet Take 0.5 tablets (500 mg total) by mouth 2 (two) times daily with a meal. 90 tablet 3   methocarbamol (ROBAXIN) 500 MG tablet Take 1 tablet (500 mg total) by mouth every 8 (eight) hours as needed for muscle spasms. 30 tablet 0   metoprolol succinate (TOPROL-XL) 100 MG 24 hr tablet TAKE 1 TABLET BY MOUTH EVERY DAY WITH OR IMMEDIATELY FOLLOWING A MEAL 90 tablet 1   rOPINIRole (REQUIP) 0.5 MG tablet Take 1 tablet (0.5 mg total) by mouth at bedtime. 90 tablet 1   Semaglutide,0.25 or 0.5MG/DOS, 2 MG/3ML SOPN Inject 0.5 mg into the skin once a week. 3 mL    triamcinolone cream (KENALOG) 0.1 % Apply 1 Application topically 2 (two) times daily. 80 g 0   Current Facility-Administered Medications  Medication Dose Route Frequency Provider Last Rate Last Admin   cyanocobalamin ((VITAMIN B-12)) injection 1,000 mcg  1,000 mcg Intramuscular Q30 days BTimmothy Euler MD   1,000 mcg at 07/14/22 1503    REVIEW  OF SYSTEMS (negative unless checked):   Cardiac:  []$  Chest pain or chest pressure? []$  Shortness of breath upon activity? []$  Shortness of breath when lying flat? []$  Irregular heart rhythm?  Vascular:  []$  Pain in calf, thigh, or hip brought on by walking? []$  Pain in feet at night that wakes you up from your sleep? []$  Blood clot in your veins? []$  Leg swelling?  Pulmonary:  []$  Oxygen at home? []$  Productive cough? []$  Wheezing?  Neurologic:  []$  Sudden weakness in arms or legs? []$  Sudden numbness in arms or legs? []$  Sudden onset of difficult speaking or slurred speech? []$  Temporary loss of vision in one eye? []$  Problems with dizziness?  Gastrointestinal:  []$  Blood in stool? []$  Vomited  blood?  Genitourinary:  []$  Burning when urinating? []$  Blood in urine?  Psychiatric:  []$  Major depression  Hematologic:  []$  Bleeding problems? []$  Problems with blood clotting?  Dermatologic:  []$  Rashes or ulcers?  Constitutional:  []$  Fever or chills?  Ear/Nose/Throat:  []$  Change in hearing? []$  Nose bleeds? []$  Sore throat?  Musculoskeletal:  []$  Back pain? []$  Joint pain? []$  Muscle pain?   Physical Examination   Vitals:   07/13/22 0939 07/13/22 0944  BP: (!) 154/69 (!) 156/67  Pulse: 60   Resp: 20   Temp: 98 F (36.7 C)   TempSrc: Temporal   SpO2: 98%   Weight: 219 lb 14.4 oz (99.7 kg)   Height: 5' 3"$  (1.6 m)    Body mass index is 38.95 kg/m.  General:  WDWN in NAD; vital signs documented above Gait: Not observed HENT: WNL, normocephalic Pulmonary: normal non-labored breathing  Cardiac: Regular rate and rhythm, no carotid bruits Abdomen: soft, NT, no masses Skin: without rashes Vascular Exam/Pulses: 2+ radial pulses bilaterally Extremities: without ischemic changes, without Gangrene , without cellulitis; without open wounds;  Musculoskeletal: no muscle wasting or atrophy  Neurologic: A&O X 3;  No focal weakness or paresthesias are detected Psychiatric:  The pt has Normal affect.  Non-Invasive Vascular Imaging   B Carotid Duplex (07/13/2022):  R ICA stenosis:  60-79% R VA:  patent and antegrade L ICA stenosis:  1-39% L VA:  patent and antegrade   Medical Decision Making   Emily [name] is a 72 y.o. female who presents for surveillance of carotid artery stenosis  Based on the patient's vascular studies, her carotid artery stenosis is unchanged bilaterally.  She has right ICA stenosis of 60 to 79%.  She has left ICA stenosis of 1 to 39% She denies any neurological symptoms.  She is compliant with her aspirin and statin She has palpable and equal radial pulses 2+ She can follow-up with our office in 6 months with bilateral carotid  duplex  Vicente Serene PA-C [facility] Office: [facility] MD: Trula Slade

## 2022-07-14 NOTE — Progress Notes (Signed)
Cyanocobalamin injection given to left deltoid.  Patient tolerated well. 

## 2022-07-15 ENCOUNTER — Other Ambulatory Visit: Payer: Self-pay

## 2022-07-15 DIAGNOSIS — Z9889 Other specified postprocedural states: Secondary | ICD-10-CM

## 2022-07-15 DIAGNOSIS — I6523 Occlusion and stenosis of bilateral carotid arteries: Secondary | ICD-10-CM

## 2022-07-15 DIAGNOSIS — I6521 Occlusion and stenosis of right carotid artery: Secondary | ICD-10-CM

## 2022-07-17 ENCOUNTER — Other Ambulatory Visit: Payer: Self-pay

## 2022-07-17 DIAGNOSIS — I6523 Occlusion and stenosis of bilateral carotid arteries: Secondary | ICD-10-CM

## 2022-07-17 DIAGNOSIS — I6521 Occlusion and stenosis of right carotid artery: Secondary | ICD-10-CM

## 2022-07-17 DIAGNOSIS — Z9889 Other specified postprocedural states: Secondary | ICD-10-CM

## 2022-08-18 ENCOUNTER — Ambulatory Visit (INDEPENDENT_AMBULATORY_CARE_PROVIDER_SITE_OTHER): Payer: Medicare Other | Admitting: *Deleted

## 2022-08-18 DIAGNOSIS — E785 Hyperlipidemia, unspecified: Secondary | ICD-10-CM

## 2022-08-18 DIAGNOSIS — E538 Deficiency of other specified B group vitamins: Secondary | ICD-10-CM

## 2022-08-18 DIAGNOSIS — I152 Hypertension secondary to endocrine disorders: Secondary | ICD-10-CM

## 2022-08-18 NOTE — Patient Instructions (Signed)
Please call the care guide team at [phone] if you need to cancel or reschedule your appointment.   If you are experiencing a Mental Health or [place] or need someone to talk to, please call the Suicide and Crisis Lifeline: 988 call the [place] National Suicide Prevention Lifeline: 6695042527 or TTY: (810) 457-5349 TTY 640-218-8383) to talk to a trained counselor call 1-800-273-TALK (toll free, 24 hour hotline) go to [facility] [address] [place] [phone]) call the [facility]: [phone] call 911   Following is a copy of the CCM Program Consent:  CCM service includes personalized support from designated clinical staff supervised by the physician, including individualized plan of care and coordination with other care providers 24/7 contact phone numbers for assistance for urgent and routine care needs. Service will only be billed when office clinical staff spend 20 minutes or more in a month to coordinate care. Only one practitioner may furnish and bill the service in a calendar month. The patient may stop CCM services at amy time (effective at the end of the month) by phone call to the office staff. The patient will be responsible for cost sharing (co-pay) or up to 20% of the service fee (after annual deductible is met)  Following is a copy of your full provider care plan:   Goals Addressed             This Visit's Progress    CCM (DIABETES) EXPECTED OUTCOME:  MONITOR, SELF-MANAGE AND REDUCE SYMPTOMS OF DIABETES       Current Barriers:  Knowledge Deficits related to Diabetes management Chronic Disease Management support and education needs related to Diabetes and diet No Advanced Directives in place- information previously mailed Patient reports she does not check CBG, has glucometer and knows how to use, states AIC is good 6.1 on 06/09/22 and does not feel the need to check Patient reports she  does not always follow a special diet, drinks mostly unsweetened tea, diet drinks and water Patient states she does not exercise, does continue to work out of her home Patient reports she followed up with Dr. Truman Hayward (GI) for chronic constipation and was given regimen of taking miralax for 3-5 days with water or coffee, then take metamucil and drink adequate water daily and if this does not help, take linzess samples,  pt states she has not been taking metamucil daily or drinking adequate water, has not tried linzess samples, pt states she will start today and take the metamucil and work on drinking more water, pt has follow up appointment with Dr. Truman Hayward and she is going to reschedule. Patient reports she had MRI at [facility] on 06/20/22 and shows spinal stenosis and DJD, followed up with Dr. Nelva Bush  Planned Interventions: Reviewed medications with patient and discussed importance of medication adherence;        Counseled on importance of regular laboratory monitoring as prescribed;        Review of patient status, including review of consultants reports, relevant laboratory and other test results, and medications completed;       Reinforced carbohydrate modified diet Reviewed importance of exercise and benefit with blood sugar levels Reviewed all upcoming scheduled appointments including B12 injection today Reviewed preventive measures for constipation Pain assessment completed  Symptom Management: Take medications as prescribed   Attend all scheduled provider appointments Call pharmacy for medication refills 3-7 days in advance of running out of medications Attend church or other social activities Perform all self  care activities independently  Perform IADL's (shopping, preparing meals, housekeeping, managing finances) independently Call provider office for new concerns or questions  check blood sugar at prescribed times: once daily check feet daily for cuts, sores or redness trim toenails straight  across drink 6 to 8 glasses of water each day eat fish at least once per week fill half of plate with vegetables limit fast food meals to no more than 1 per week manage portion size prepare main meal at home 3 to 5 days each week read food labels for fat, fiber, carbohydrates and portion size wash and dry feet carefully every day wear comfortable, cotton socks wear comfortable, well-fitting shoes Be mindful of carbohydrate intake- too much bread, pasta, rice, potatoes at one meal will elevate blood sugar Avoid/ limit concentrated sweets Drink adequate fluids, preferably water Try to metamucil as prescribed by Dr. 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   Patient is finished attending outpatient PT for lower back/ spine issues, had 5-6 sessions.  Planned Interventions: Evaluation of current treatment plan related to hypertension self management and patient's adherence to plan as established by provider;   Reviewed medications with  patient and discussed importance of compliance;  Counseled on the importance of exercise goals with target of 150 minutes per week Advised patient, providing education and rationale, to monitor blood pressure daily and record, calling PCP for findings outside established parameters;  Advised patient to discuss any issues with blood pressure, medications with provider; Reinforced low sodium diet Reviewed safety precautions Reviewed importance of completing exercises prescribed by PT  Symptom Management: Take medications as prescribed   Attend all scheduled provider appointments Call pharmacy for medication refills 3-7 days in advance of running out of medications Attend church or other social activities Perform all self care activities independently  Perform IADL's (shopping, preparing meals, housekeeping, managing finances) independently Call provider office for new concerns or questions  check blood pressure weekly choose a place to take my blood pressure (home, clinic or office, retail store) write blood pressure results in a log or diary learn about high blood pressure keep a blood pressure log take blood pressure log to all doctor appointments call doctor for signs and symptoms of high blood pressure keep all doctor appointments take medications for blood pressure exactly as prescribed begin an exercise program eat more whole grains, fruits and vegetables, lean meats and healthy fats Follow low sodium diet- read labels, avoid/ limit fast food Practice relaxation daily, keep stress to a minimum  fall prevention strategies: change position slowly, use assistive device such as walker or cane (per provider recommendations) when walking, keep walkways clear, have good lighting in room. It is important to contact your provider if you have any falls, maintain muscle strength/tone by exercise per provider recommendations.  Follow Up Plan: Telephone follow up appointment with care management  team member scheduled for:  11/24/22 at 9 am          The patient verbalized understanding of instructions, educational materials, and care plan provided today and DECLINED offer to receive copy of patient instructions, educational materials, and care plan.   Telephone follow up appointment with care management team member scheduled for:  11/24/22 at 9 am

## 2022-08-18 NOTE — Chronic Care Management (AMB) (Signed)
Chronic Care Management   CCM RN Visit Note  08/18/2022 Name: Emily [name] MRN: [id number] DOB: 06/02/1950  Subjective: Emily [name] is a 72 y.o. year old female who is a primary care patient of Janora Norlander, DO. The patient was referred to the Chronic Care Management team for assistance with care management needs subsequent to provider initiation of CCM services and plan of care.    Today's Visit:  Engaged with patient by telephone for follow up visit.        Goals Addressed             This Visit's Progress    CCM (DIABETES) EXPECTED OUTCOME:  MONITOR, SELF-MANAGE AND REDUCE SYMPTOMS OF DIABETES       Current Barriers:  Knowledge Deficits related to Diabetes management Chronic Disease Management support and education needs related to Diabetes and diet No Advanced Directives in place- information previously mailed Patient reports she does not check CBG, has glucometer and knows how to use, states AIC is good 6.1 on 06/09/22 and does not feel the need to check Patient reports she does not always follow a special diet, drinks mostly unsweetened tea, diet drinks and water Patient states she does not exercise, does continue to work out of her home Patient reports she followed up with Dr. Truman Hayward (GI) for chronic constipation and was given regimen of taking miralax for 3-5 days with water or coffee, then take metamucil and drink adequate water daily and if this does not help, take linzess samples,  pt states she has not been taking metamucil daily or drinking adequate water, has not tried linzess samples, pt states she will start today and take the metamucil and work on drinking more water, pt has follow up appointment with Dr. Truman Hayward and she is going to reschedule. Patient reports she had MRI at [facility] on 06/20/22 and shows spinal stenosis and DJD, followed up with Dr. Nelva Bush  Planned Interventions: Reviewed medications with patient and discussed importance of medication adherence;         Counseled on importance of regular laboratory monitoring as prescribed;        Review of patient status, including review of consultants reports, relevant laboratory and other test results, and medications completed;       Reinforced carbohydrate modified diet Reviewed importance of exercise and benefit with blood sugar levels Reviewed all upcoming scheduled appointments including B12 injection today Reviewed preventive measures for constipation Pain assessment completed  Symptom Management: Take medications as prescribed   Attend all scheduled provider appointments Call pharmacy for medication refills 3-7 days in advance of running out of medications Attend church or other social activities Perform all self care activities independently  Perform IADL's (shopping, preparing meals, housekeeping, managing finances) independently Call provider office for new concerns or questions  check blood sugar at prescribed times: once daily check feet daily for cuts, sores or redness trim toenails straight across drink 6 to 8 glasses of water each day eat fish at least once per week fill half of plate with vegetables limit fast food meals to no more than 1 per week manage portion size prepare main meal at home 3 to 5 days each week read food labels for fat, fiber, carbohydrates and portion size wash and dry feet carefully every day wear comfortable, cotton socks wear comfortable, well-fitting shoes Be mindful of carbohydrate intake- too much bread, pasta, rice, potatoes at one meal will elevate blood sugar Avoid/ limit concentrated sweets Drink adequate fluids, preferably water Try  to metamucil as prescribed by Dr. 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   Patient is finished attending outpatient PT for lower back/ spine issues, had 5-6 sessions.  Planned Interventions: Evaluation of current treatment plan related to hypertension self management and patient's adherence to plan as established by provider;   Reviewed medications with patient and discussed importance of compliance;  Counseled on the importance of exercise goals with target of 150 minutes per week Advised patient, providing education and rationale, to monitor blood pressure daily and record, calling PCP for findings outside established parameters;  Advised patient to discuss any issues with blood pressure, medications with provider; Reinforced low sodium diet Reviewed safety precautions Reviewed importance of completing exercises prescribed by PT  Symptom Management: Take medications as prescribed   Attend all scheduled provider appointments Call pharmacy for medication refills 3-7 days in advance of running out of medications Attend church or other social activities Perform all self care activities independently  Perform IADL's (shopping, preparing meals, housekeeping, managing finances) independently Call provider office for new concerns or questions   check blood pressure weekly choose a place to take my blood pressure (home, clinic or office, retail store) write blood pressure results in a log or diary learn about high blood pressure keep a blood pressure log take blood pressure log to all doctor appointments call doctor for signs and symptoms of high blood pressure keep all doctor appointments take medications for blood pressure exactly as prescribed begin an exercise program eat more whole grains, fruits and vegetables, lean meats and healthy fats Follow low sodium diet- read labels, avoid/ limit fast food Practice relaxation daily, keep stress to a minimum fall prevention strategies: change position slowly, use assistive device such as walker or cane (per provider recommendations) when walking, keep walkways clear, have good lighting in room. It is important to contact your provider if you have any falls, maintain muscle strength/tone by exercise per provider recommendations.  Follow Up Plan: Telephone follow up appointment with care management team member scheduled for:  11/24/22 at 9 am          Plan:Telephone follow up appointment with care management team member scheduled for:  11/24/22 at 9 am  Jacqlyn Larsen [facility], BSN RN Case Manager [place] [phone]

## 2022-08-27 ENCOUNTER — Other Ambulatory Visit: Payer: Self-pay | Admitting: Family Medicine

## 2022-08-30 DIAGNOSIS — E1159 Type 2 diabetes mellitus with other circulatory complications: Secondary | ICD-10-CM | POA: Diagnosis not present

## 2022-08-30 DIAGNOSIS — E785 Hyperlipidemia, unspecified: Secondary | ICD-10-CM

## 2022-08-30 DIAGNOSIS — Z7984 Long term (current) use of oral hypoglycemic drugs: Secondary | ICD-10-CM

## 2022-08-30 DIAGNOSIS — I10 Essential (primary) hypertension: Secondary | ICD-10-CM

## 2022-09-01 DIAGNOSIS — E119 Type 2 diabetes mellitus without complications: Secondary | ICD-10-CM | POA: Diagnosis not present

## 2022-09-01 DIAGNOSIS — K5900 Constipation, unspecified: Secondary | ICD-10-CM | POA: Diagnosis not present

## 2022-09-08 ENCOUNTER — Ambulatory Visit (HOSPITAL_COMMUNITY): Payer: Medicare Other | Attending: Cardiology

## 2022-09-08 DIAGNOSIS — I350 Nonrheumatic aortic (valve) stenosis: Secondary | ICD-10-CM | POA: Diagnosis not present

## 2022-09-08 LAB — ECHOCARDIOGRAM COMPLETE
AR max vel: 0.92 cm2
AV Area VTI: 0.88 cm2
AV Area mean vel: 0.84 cm2
AV Mean grad: 39 mmHg
AV Peak grad: 56 mmHg
Ao pk vel: 3.74 m/s
Area-P 1/2: 3.34 cm2
MV VTI: 1.77 cm2
S' Lateral: 2.9 cm

## 2022-09-22 ENCOUNTER — Ambulatory Visit (INDEPENDENT_AMBULATORY_CARE_PROVIDER_SITE_OTHER): Payer: Medicare Other

## 2022-09-22 DIAGNOSIS — E538 Deficiency of other specified B group vitamins: Secondary | ICD-10-CM

## 2022-09-22 NOTE — Progress Notes (Signed)
Cyanocobalamin injection given to right deltoid.  Patient tolerated well. 

## 2022-09-25 ENCOUNTER — Telehealth: Payer: Self-pay | Admitting: Pharmacist

## 2022-09-25 NOTE — Telephone Encounter (Signed)
Patient notified of Ozempic 0.5mg  sq weekly shipment #5 boxes are ready for pick up in the lab fridge Denies personal and family history of Medullary thyroid cancer (MTC)

## 2022-09-29 DIAGNOSIS — D3132 Benign neoplasm of left choroid: Secondary | ICD-10-CM | POA: Diagnosis not present

## 2022-09-29 DIAGNOSIS — Z961 Presence of intraocular lens: Secondary | ICD-10-CM | POA: Diagnosis not present

## 2022-09-29 DIAGNOSIS — E119 Type 2 diabetes mellitus without complications: Secondary | ICD-10-CM | POA: Diagnosis not present

## 2022-09-29 LAB — HM DIABETES EYE EXAM

## 2022-10-13 ENCOUNTER — Encounter: Payer: Self-pay | Admitting: Family Medicine

## 2022-10-13 ENCOUNTER — Ambulatory Visit (INDEPENDENT_AMBULATORY_CARE_PROVIDER_SITE_OTHER): Payer: Medicare Other | Admitting: Family Medicine

## 2022-10-13 VITALS — BP 116/50 | HR 64 | Temp 98.6°F | Ht 63.0 in | Wt 218.0 lb

## 2022-10-13 DIAGNOSIS — E1169 Type 2 diabetes mellitus with other specified complication: Secondary | ICD-10-CM | POA: Diagnosis not present

## 2022-10-13 DIAGNOSIS — I152 Hypertension secondary to endocrine disorders: Secondary | ICD-10-CM | POA: Diagnosis not present

## 2022-10-13 DIAGNOSIS — Z7985 Long-term (current) use of injectable non-insulin antidiabetic drugs: Secondary | ICD-10-CM

## 2022-10-13 DIAGNOSIS — E034 Atrophy of thyroid (acquired): Secondary | ICD-10-CM | POA: Diagnosis not present

## 2022-10-13 DIAGNOSIS — E1159 Type 2 diabetes mellitus with other circulatory complications: Secondary | ICD-10-CM

## 2022-10-13 DIAGNOSIS — E785 Hyperlipidemia, unspecified: Secondary | ICD-10-CM

## 2022-10-13 LAB — BAYER DCA HB A1C WAIVED: HB A1C (BAYER DCA - WAIVED): 6 % — ABNORMAL HIGH (ref 4.8–5.6)

## 2022-10-13 NOTE — Progress Notes (Unsigned)
Subjective: CC:DM PCP: Raliegh Ip, DO [id number] Emily [name] is a 72 y.o. female presenting to clinic today for:  1. Type 2 Diabetes with hypertension, hyperlipidemia:  Patient reports compliance with her medications.  She reports no hypoglycemic episodes.  She is tolerating the Ozempic without difficulty reports no abdominal pain, nausea or vomiting.  She unfortunately has been having some back pain  Last eye exam: needs Last foot exam: UTD Last A1c:  Lab Results  Component Value Date   HGBA1C 6.0 (H) 10/13/2022   Nephropathy screen indicated?: UTD Last flu, zoster and/or pneumovax:  Immunization History  Administered Date(s) Administered   Fluad Quad(high Dose 65+) 03/24/2016, 02/28/2019, 03/05/2020, 06/09/2022   Influenza, High Dose Seasonal PF 03/24/2016, 03/22/2018   Influenza, Seasonal, Injecte, Preservative Fre 06/05/2014, 07/02/2015   Influenza,inj,Quad PF,6+ Mos 06/05/2014, 07/02/2015   Influenza-Unspecified 03/24/2016   PFIZER(Purple Top)SARS-COV-2 Vaccination 07/10/2019, 08/03/2019, 03/13/2020   Pneumococcal Conjugate-13 07/09/2015   Pneumococcal Polysaccharide-23 01/30/2014, 02/28/2019   Tdap 05/26/2014    ROS: Per HPI  Allergies  Allergen Reactions   Jardiance [Empagliflozin] Other (See Comments)    RECURRENT VAGINITIS   Quinapril Hcl Cough   Statins Other (See Comments)    Not all Statins but some cause cough and pain in legs.   Tape Other (See Comments)    Redness, please use "paper" tape   Past Medical History:  Diagnosis Date   Anemia    Arthritis    Atrial fibrillation with RVR (HCC) 08/30/2020   Atrial fibrillation, rapid (HCC) 10/27/2013   Bilateral carotid artery disease (HCC)    Diabetes mellitus without complication (HCC)    Difficult intubation    states 'lady that did the sleep study told me I have the smallest airway she has ever seen in an adult"   Dyslipidemia 06/29/2017   Dysrhythmia    Hypertension    Hypothyroid     Obesity    Obstructive sleep apnea    on C Pap   PAF (paroxysmal atrial fibrillation) (HCC)    a. newly dx in 09/2013; on eliquis   Upper GI bleed 09/25/2019    Current Outpatient Medications:    aspirin EC 81 MG tablet, Take 81 mg by mouth daily., Disp: , Rfl:    bisacodyl (DULCOLAX) 5 MG EC tablet, Take 5 mg by mouth daily. (Patient not taking: Reported on 08/18/2022), Disp: , Rfl:    diltiazem (CARDIZEM CD) 180 MG 24 hr capsule, TAKE 1 CAPSULE BY MOUTH EVERY DAY, Disp: 90 capsule, Rfl: 2   diltiazem (CARDIZEM) 30 MG tablet, Take 30 mg by mouth as needed., Disp: , Rfl:    ezetimibe (ZETIA) 10 MG tablet, TAKE 1 TABLET BY MOUTH EVERY DAY, Disp: 90 tablet, Rfl: 3   flecainide (TAMBOCOR) 100 MG tablet, TAKE 1 TABLET BY MOUTH TWICE A DAY, Disp: 180 tablet, Rfl: 3   furosemide (LASIX) 20 MG tablet, TAKE 1 TABLET (20 MG TOTAL) BY MOUTH DAILY AS NEEDED FOR FLUID., Disp: 90 tablet, Rfl: 0   gabapentin (NEURONTIN) 300 MG capsule, Take 1 capsule (300 mg total) by mouth 2 (two) times daily as needed (neuropathy right leg)., Disp: 180 capsule, Rfl: 2   glipiZIDE (GLUCOTROL) 5 MG tablet, TAKE 1 TABLET BY MOUTH TWICE A DAY, Disp: 180 tablet, Rfl: 0   HYDROcodone-acetaminophen (NORCO) 5-325 MG tablet, Take 1 tablet by mouth every 6 (six) hours as needed for severe pain., Disp: 20 tablet, Rfl: 0   ketoconazole (NIZORAL) 2 % cream, Apply 1 application topically daily.  Prn intertrigo rash, Disp: 60 g, Rfl: 1   levothyroxine (SYNTHROID) 112 MCG tablet, TAKE 1 TABLET BY MOUTH EVERY OTHER DAY, ALTERNATING WITH 1 & 1/2 TABLETS EVERY OTHER DAY, Disp: 112 tablet, Rfl: 3   losartan (COZAAR) 25 MG tablet, TAKE 1 TABLET (25 MG TOTAL) BY MOUTH DAILY., Disp: 90 tablet, Rfl: 3   lovastatin (MEVACOR) 40 MG tablet, TAKE 1 TABLET BY MOUTH EVERY DAY IN THE EVENING, Disp: 90 tablet, Rfl: 3   metFORMIN (GLUCOPHAGE) 1000 MG tablet, Take 0.5 tablets (500 mg total) by mouth 2 (two) times daily with a meal., Disp: 90 tablet, Rfl: 3    methocarbamol (ROBAXIN) 500 MG tablet, Take 1 tablet (500 mg total) by mouth every 8 (eight) hours as needed for muscle spasms., Disp: 30 tablet, Rfl: 0   metoprolol succinate (TOPROL-XL) 100 MG 24 hr tablet, TAKE 1 TABLET BY MOUTH EVERY DAY WITH OR IMMEDIATELY FOLLOWING A MEAL, Disp: 90 tablet, Rfl: 1   rOPINIRole (REQUIP) 0.5 MG tablet, TAKE 1 TABLET BY MOUTH AT BEDTIME., Disp: 90 tablet, Rfl: 0   Semaglutide,0.25 or 0.5MG /DOS, 2 MG/3ML SOPN, Inject 0.5 mg into the skin once a week., Disp: 3 mL, Rfl:    triamcinolone cream (KENALOG) 0.1 %, Apply 1 Application topically 2 (two) times daily., Disp: 80 g, Rfl: 0  Current Facility-Administered Medications:    cyanocobalamin ((VITAMIN B-12)) injection 1,000 mcg, 1,000 mcg, Intramuscular, Q30 days, Elenora Gamma, MD, 1,000 mcg at 09/22/22 1010 Social History   Socioeconomic History   Marital status: Single    Spouse name: Not on file   Number of children: Not on file   Years of education: Not on file   Highest education level: Not on file  Occupational History   Not on file  Tobacco Use   Smoking status: Never    Passive exposure: Never   Smokeless tobacco: Never  Vaping Use   Vaping Use: Never used  Substance and Sexual Activity   Alcohol use: No    Alcohol/week: 0.0 standard drinks of alcohol   Drug use: Never   Sexual activity: Never  Other Topics Concern   Not on file  Social History Narrative   Not on file   Social Determinants of Health   Financial Resource Strain: Low Risk  (04/21/2022)   Overall Financial Resource Strain (CARDIA)    Difficulty of Paying Living Expenses: Not hard at all  Food Insecurity: No Food Insecurity (04/21/2022)   Hunger Vital Sign    Worried About Running Out of Food in the Last Year: Never true    Ran Out of Food in the Last Year: Never true  Transportation Needs: No Transportation Needs (04/21/2022)   PRAPARE - Administrator, Civil Service (Medical): No    Lack of  Transportation (Non-Medical): No  Physical Activity: Inactive (04/21/2022)   Exercise Vital Sign    Days of Exercise per Week: 0 days    Minutes of Exercise per Session: 0 min  Stress: No Stress Concern Present (04/21/2022)   [employer] of Occupational Health - Occupational Stress Questionnaire    Feeling of Stress : Not at all  Social Connections: Moderately Integrated (04/21/2022)   Social Connection and Isolation Panel [NHANES]    Frequency of Communication with Friends and Family: More than three times a week    Frequency of Social Gatherings with Friends and Family: Twice a week    Attends Religious Services: More than 4 times per year    Active  Member of Clubs or Organizations: Yes    Attends Engineer, structural: More than 4 times per year    Marital Status: Never married  Intimate Partner Violence: Not At Risk (12/21/2017)   Humiliation, Afraid, Rape, and Kick questionnaire    Fear of Current or Ex-Partner: No    Emotionally Abused: No    Physically Abused: No    Sexually Abused: No   Family History  Problem Relation Age of Onset   COPD Father    Heart failure Father    Heart disease Father    Emphysema Father    Arrhythmia Sister    Arrhythmia Sister    Arrhythmia Sister        had PPM also   Cancer Sister     Objective: Office vital signs reviewed. BP (!) 116/50   Pulse 64   Temp 98.6 F (37 C)   Ht 5\' 3"  (1.6 m)   Wt 218 lb (98.9 kg)   SpO2 95%   BMI 38.62 kg/m   Physical Examination:  General: Awake, alert, well nourished, No acute distress HEENT: No goiter.  No exophthalmos Cardio: regular rate and rhythm, S1S2 heard, + murmurs appreciated Pulm: clear to auscultation bilaterally, no wheezes, rhonchi or rales; normal work of breathing on room air MSK: Antalgic gait but ambulating independently  Assessment/ Plan: 72 y.o. female   Type 2 diabetes mellitus with hyperlipidemia (HCC) - Plan: Bayer DCA Hb A1c Waived  Hypertension  associated with diabetes (HCC)  Hyperlipidemia associated with type 2 diabetes mellitus (HCC)  Hypothyroidism due to acquired atrophy of thyroid  Sugar well-controlled with A1c of 6.0 today.  No changes.  Blood pressure well-controlled.  No changes.  She will continue her statin and Zetia for cholesterol control.  Will be due for lipid panel at next visit  She is asymptomatic from a thyroid standpoint.  Plan to repeat thyroid levels at next visit  No orders of the defined types were placed in this encounter.  No orders of the defined types were placed in this encounter.    Raliegh Ip, DO [facility] [phone]

## 2022-10-18 NOTE — Progress Notes (Unsigned)
Cardiology Office Note:   Date:  10/20/2022  ID:  Emily [name], DOB 1951-01-08, MRN [id number]  History of Present Illness:   Emily [name] is a 72 y.o. female who presents for followup of atrial fib.   The patient has paroxysmal atrial fibrillation and was treated with flecainide. She has had recurrent GI bleeding and could not tolerate anticoagulation. She has had transfusions.  She subsequently had double-balloon enteroscopy.   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  She did have Watchman.  She has moderate AS.   She had atrial fib with RVR.and was in the hospital in April.   She spontaneously converted to NSR after IV Dilt.  She had a UTI.  She did have a troponin that went up to 1738.  Echocardiogram was essentially unremarkable except for the moderate left ventricular hypertrophy and aortic stenosis.  She has LVH as well.  There were no wall motion abnormalities.  She did have a stress perfusion study which suggested possibly fixed defect in the apex with some mild peri-infarct ischemia.  We managed this medically.  She has required increased doses of flecainide for management of her atrial fib.     Since she was last seen she has done okay.  She still works as a Interior and spatial designer.  She lives alone.  The patient denies any new symptoms such as chest discomfort, neck or arm discomfort. There has been no new shortness of breath, PND or orthopnea. There have been no reported palpitations, presyncope or syncope.   He denies any new shortness of breath, PND or orthopnea   ROS: As stated in the HPI and negative for all other systems.  Studies Reviewed:    EKG: Sinus rhythm, right bundle branch block, left anterior fascicular block   Risk Assessment/Calculations:    CHA2DS2-VASc Score = 4   This indicates a 4.8% annual risk of stroke. The patient's score is based upon: CHF History: 0 HTN History: 1 Diabetes History: 1 Stroke History: 0 Vascular Disease History:  0 Age Score: 1 Gender Score: 1     Physical Exam:   VS:  BP 112/74   Pulse 63   Ht 5\' 3"  (1.6 m)   Wt 227 lb (103 kg)   SpO2 95%   BMI 40.21 kg/m    Wt Readings from Last 3 Encounters:  10/20/22 227 lb (103 kg)  10/13/22 218 lb (98.9 kg)  07/13/22 219 lb 14.4 oz (99.7 kg)     GEN: Well nourished, well developed in no acute distress NECK: No JVD; No carotid bruits CARDIAC: RRR, 3 of 6 apical systolic murmur mid peaking, no diastolic murmurs, rubs, gallops RESPIRATORY:  Clear to auscultation without rales, wheezing or rhonchi  ABDOMEN: Soft, non-tender, non-distended EXTREMITIES:  No edema; No deformity   ASSESSMENT AND PLAN:   ATRIAL FIB:   .  Emily [name] has a CHA2DS2 - VASc score of 4.  She has not had any symptomatic paroxysms.  She tolerates.  No change in therapy.     HTN:   The blood pressure is at target.  No change in therapy.    CAROTID STENOSIS:  She had 60 - 79% right stenosis and left 40 - 59% stenosis .   She has this followed every 6 months.  Last was in February 2024.    DM:  A1c was 6.0 which is down from 6.4.  No change in therapy    AS/LVH:   She had moderate to  severe AS  in April 2024.   This has increased from a mean of 21-39.  I am going to follow this more closely with an echo in October.  She so far has no new symptoms but we talked about this at length today.  She eventually will need valve replacement.  Conduction disturbance: She has no bradycardic symptoms.  No change in therapy.        Signed, Rollene Rotunda, MD

## 2022-10-20 ENCOUNTER — Ambulatory Visit: Payer: Medicare Other | Attending: Cardiology | Admitting: Cardiology

## 2022-10-20 ENCOUNTER — Encounter: Payer: Self-pay | Admitting: Cardiology

## 2022-10-20 VITALS — BP 112/74 | HR 63 | Ht 63.0 in | Wt 227.0 lb

## 2022-10-20 DIAGNOSIS — I480 Paroxysmal atrial fibrillation: Secondary | ICD-10-CM

## 2022-10-20 DIAGNOSIS — I6523 Occlusion and stenosis of bilateral carotid arteries: Secondary | ICD-10-CM

## 2022-10-20 DIAGNOSIS — E118 Type 2 diabetes mellitus with unspecified complications: Secondary | ICD-10-CM

## 2022-10-20 DIAGNOSIS — I350 Nonrheumatic aortic (valve) stenosis: Secondary | ICD-10-CM | POA: Diagnosis not present

## 2022-10-20 DIAGNOSIS — I10 Essential (primary) hypertension: Secondary | ICD-10-CM | POA: Diagnosis not present

## 2022-10-20 NOTE — Patient Instructions (Signed)
Medication Instructions:  No Changes In Medications at this time.   *If you need a refill on your cardiac medications before your next appointment, please call your pharmacy*  Testing/Procedures: Your physician has requested that you have an echocardiogram IN OCTOBER. Echocardiography is a painless test that uses sound waves to create images of your heart. It provides your doctor with information about the size and shape of your heart and how well your heart's chambers and valves are working. You may receive an ultrasound enhancing agent through an IV if needed to better visualize your heart during the echo.This procedure takes approximately one hour. There are no restrictions for this procedure. This will take place at the [address].    Follow-Up: At [facility], you and your health needs are our priority.  As part of our continuing mission to provide you with exceptional heart care, we have created designated Provider Care Teams.  These Care Teams include your primary Cardiologist (physician) and Advanced Practice Providers (APPs -  Physician Assistants and Nurse Practitioners) who all work together to provide you with the care you need, when you need it.  Your next appointment:   OCTOBER IN [place]   Provider:   Rollene Rotunda, MD

## 2022-10-27 ENCOUNTER — Ambulatory Visit: Payer: Medicare Other

## 2022-10-27 DIAGNOSIS — Z1231 Encounter for screening mammogram for malignant neoplasm of breast: Secondary | ICD-10-CM | POA: Diagnosis not present

## 2022-10-27 LAB — HM MAMMOGRAPHY

## 2022-10-28 ENCOUNTER — Ambulatory Visit (INDEPENDENT_AMBULATORY_CARE_PROVIDER_SITE_OTHER): Payer: Medicare Other

## 2022-10-28 DIAGNOSIS — E538 Deficiency of other specified B group vitamins: Secondary | ICD-10-CM

## 2022-11-03 DIAGNOSIS — K5900 Constipation, unspecified: Secondary | ICD-10-CM | POA: Diagnosis not present

## 2022-11-04 ENCOUNTER — Other Ambulatory Visit: Payer: Self-pay | Admitting: Cardiology

## 2022-11-24 ENCOUNTER — Ambulatory Visit (INDEPENDENT_AMBULATORY_CARE_PROVIDER_SITE_OTHER): Payer: Medicare Other | Admitting: *Deleted

## 2022-11-24 DIAGNOSIS — E1169 Type 2 diabetes mellitus with other specified complication: Secondary | ICD-10-CM

## 2022-11-24 DIAGNOSIS — E1159 Type 2 diabetes mellitus with other circulatory complications: Secondary | ICD-10-CM

## 2022-11-24 NOTE — Chronic Care Management (AMB) (Signed)
Chronic Care Management   CCM RN Visit Note  11/24/2022 Name: Emily [name] MRN: [id number] DOB: July 23, 1950  Subjective: Emily [name] is a 73 y.o. year old female who is a primary care patient of Raliegh Ip, DO. The patient was referred to the Chronic Care Management team for assistance with care management needs subsequent to provider initiation of CCM services and plan of care.    Today's Visit:  Engaged with patient by telephone for follow up visit.        Goals Addressed             This Visit's Progress    COMPLETED: CCM (DIABETES) EXPECTED OUTCOME:  MONITOR, SELF-MANAGE AND REDUCE SYMPTOMS OF DIABETES       Current Barriers:  Knowledge Deficits related to Diabetes management Chronic Disease Management support and education needs related to Diabetes and diet No Advanced Directives in place- information previously mailed Patient reports she does not check CBG, has glucometer and knows how to use, states AIC is good 6.1 on 06/09/22 and does not feel the need to check Patient reports she does not always follow a special diet, drinks mostly unsweetened tea, diet drinks and water Patient states she does not exercise, does continue to work out of her home Patient reports she followed up with Dr. Nedra Hai (GI) for chronic constipation and is taking metamucil consistently and uses miralax prn, is trying to drink adequate fluids Patient reports she had MRI at [facility] on 06/20/22 and [name] spinal stenosis and DJD, followed up with Dr. Ethelene Hal  Planned Interventions: Reviewed medications with patient and discussed importance of medication adherence;        Counseled on importance of regular laboratory monitoring as prescribed;        Review of patient status, including review of consultants reports, relevant laboratory and other test results, and medications completed;       Reviewed carbohydrate modified diet Reinforced importance of exercise and benefit with blood sugar  levels Reviewed all upcoming scheduled appointments  Reviewed preventive measures for constipation Pain assessment completed Reviewed plan of care, case closure today, no further needs for care management  Symptom Management: Take medications as prescribed   Attend all scheduled provider appointments Call pharmacy for medication refills 3-7 days in advance of running out of medications Attend church or other social activities Perform all self care activities independently  Perform IADL's (shopping, preparing meals, housekeeping, managing finances) independently Call provider office for new concerns or questions  check blood sugar at prescribed times: once daily check feet daily for cuts, sores or redness trim toenails straight across drink 6 to 8 glasses of water each day eat fish at least once per week fill half of plate with vegetables limit fast food meals to no more than 1 per week manage portion size prepare main meal at home 3 to 5 days each week read food labels for fat, fiber, carbohydrates and portion size wash and dry feet carefully every day wear comfortable, cotton socks wear comfortable, well-fitting shoes Be mindful of carbohydrate intake- too much bread, pasta, rice, potatoes at one meal will elevate blood sugar Avoid/ limit concentrated sweets Drink adequate fluids, preferably water Try to metamucil consistently and use miralax as needed  Follow Up Plan: No further follow up required: Case closure           COMPLETED: CCM (HYPERTENSION) EXPECTED OUTCOME: MONITOR, SELF-MANAGE AND REDUCE SYMPTOMS OF HYPERTENSION       Current Barriers:  Knowledge Deficits related to  Hypertension management Chronic Disease Management support and education needs related to Hypertension, diet, exercise No Advanced Directives in place- information previously mailed Patient reports she lives alone, is independent with all aspects of her care, 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   Patient is finished attending outpatient PT for lower back/ spine issues, had 5-6 sessions, no new concerns reported today  Planned Interventions: Evaluation of current treatment plan related to hypertension self management and patient's adherence to plan as established by provider;   Reviewed medications with patient and discussed importance of compliance;  Counseled on the importance of exercise goals with target of 150 minutes per week Advised patient, providing education and rationale, to monitor blood pressure daily and record, calling PCP for findings outside established parameters;  Advised patient to discuss any issues with blood pressure, medications with provider; Reinforced low sodium diet Reinforced safety precautions Reinforced importance of completing exercises prescribed by PT  Symptom Management: Take medications as prescribed   Attend all scheduled provider appointments Call pharmacy for medication refills 3-7 days in advance of running out of medications Attend church or other social activities Perform all self care activities independently  Perform IADL's (shopping, preparing meals, housekeeping, managing finances) independently Call provider office for new concerns or questions  check blood pressure weekly choose a place to take my blood pressure (home, clinic or office, retail store) write blood pressure results in a log or diary learn about high blood pressure keep a blood pressure log take blood pressure log to all doctor appointments call doctor for signs and symptoms of high  blood pressure keep all doctor appointments take medications for blood pressure exactly as prescribed begin an exercise program eat more whole grains, fruits and vegetables, lean meats and healthy fats Follow low sodium diet- read labels, avoid/ limit fast food Practice relaxation daily, keep stress to a minimum Try to consistently complete exercises prescribed by physical therapist Case closure today- please let your doctor know if you have any care management needs in the future fall prevention strategies: change position slowly, use assistive device such as walker or cane (per provider recommendations) when walking, keep walkways clear, have good lighting in room. It is important to contact your provider if you have any falls, maintain muscle strength/tone by exercise per provider recommendations.  Follow Up Plan: No further follow up required: Case closure            Plan:No further follow up required: case closure  Emily [name] [facility], BSN RN Case Manager [facility] [phone]

## 2022-11-24 NOTE — Patient Instructions (Signed)
Please call the care guide team at [phone] if you need to cancel or reschedule your appointment.   If you are experiencing a Mental Health or Behavioral Health Crisis or need someone to talk to, please call the Suicide and Crisis Lifeline: 988 call the [place] National Suicide Prevention Lifeline: 330 063 0662 or TTY: 715-248-0035 TTY 615-864-4805) to talk to a trained counselor call 1-800-273-TALK (toll free, 24 hour hotline) go to [facility] [address], [place] [phone]) call the [facility]: [phone] call 911   Following is a copy of the CCM Program Consent:  CCM service includes personalized support from designated clinical staff supervised by the physician, including individualized plan of care and coordination with other care providers 24/7 contact phone numbers for assistance for urgent and routine care needs. Service will only be billed when office clinical staff spend 20 minutes or more in a month to coordinate care. Only one practitioner may furnish and bill the service in a calendar month. The patient may stop CCM services at amy time (effective at the end of the month) by phone call to the office staff. The patient will be responsible for cost sharing (co-pay) or up to 20% of the service fee (after annual deductible is met)  Following is a copy of your full provider care plan:   Goals Addressed             This Visit's Progress    COMPLETED: CCM (DIABETES) EXPECTED OUTCOME:  MONITOR, SELF-MANAGE AND REDUCE SYMPTOMS OF DIABETES       Current Barriers:  Knowledge Deficits related to Diabetes management Chronic Disease Management support and education needs related to Diabetes and diet No Advanced Directives in place- information previously mailed Patient reports she does not check CBG, has glucometer and knows how to use, states AIC is good 6.1 on 06/09/22 and does not feel the need to check Patient  reports she does not always follow a special diet, drinks mostly unsweetened tea, diet drinks and water Patient states she does not exercise, does continue to work out of her home Patient reports she followed up with Dr. Nedra Hai (GI) for chronic constipation and is taking metamucil consistently and uses miralax prn, is trying to drink adequate fluids Patient reports she had MRI at [facility] on 06/20/22 and shows spinal stenosis and DJD, followed up with Dr. Ethelene Hal  Planned Interventions: Reviewed medications with patient and discussed importance of medication adherence;        Counseled on importance of regular laboratory monitoring as prescribed;        Review of patient status, including review of consultants reports, relevant laboratory and other test results, and medications completed;       Reviewed carbohydrate modified diet Reinforced importance of exercise and benefit with blood sugar levels Reviewed all upcoming scheduled appointments  Reviewed preventive measures for constipation Pain assessment completed Reviewed plan of care, case closure today, no further needs for care management  Symptom Management: Take medications as prescribed   Attend all scheduled provider appointments Call pharmacy for medication refills 3-7 days in advance of running out of medications Attend church or other social activities Perform all self care activities independently  Perform IADL's (shopping, preparing meals, housekeeping, managing finances) independently Call provider office for new concerns or questions  check blood sugar at prescribed times: once daily check feet daily for cuts, sores or redness trim toenails straight across drink 6 to 8 glasses of water each day eat fish at  least once per week fill half of plate with vegetables limit fast food meals to no more than 1 per week manage portion size prepare main meal at home 3 to 5 days each week read food labels for fat, fiber, carbohydrates and  portion size wash and dry feet carefully every day wear comfortable, cotton socks wear comfortable, well-fitting shoes Be mindful of carbohydrate intake- too much bread, pasta, rice, potatoes at one meal will elevate blood sugar Avoid/ limit concentrated sweets Drink adequate fluids, preferably water Try to metamucil consistently and use miralax as needed  Follow Up Plan: No further follow up required: Case closure           COMPLETED: CCM (HYPERTENSION) EXPECTED OUTCOME: MONITOR, SELF-MANAGE AND REDUCE SYMPTOMS OF HYPERTENSION       Current Barriers:  Knowledge Deficits related to Hypertension management Chronic Disease Management support and education needs related to Hypertension, diet, exercise No Advanced Directives in place- information previously mailed Patient reports she lives alone, is independent with all aspects of her care, 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   Patient is finished attending outpatient PT for lower back/ spine issues, had 5-6 sessions, no new concerns reported today  Planned Interventions: Evaluation of current treatment plan related to hypertension self management and patient's adherence to plan as established by provider;   Reviewed medications with patient and discussed importance of compliance;  Counseled on the importance of exercise goals with target of 150 minutes per week Advised patient, providing education and rationale, to monitor blood pressure daily and record, calling PCP for findings outside established parameters;  Advised patient to discuss any issues  with blood pressure, medications with provider; Reinforced low sodium diet Reinforced safety precautions Reinforced importance of completing exercises prescribed by PT  Symptom Management: Take medications as prescribed   Attend all scheduled provider appointments Call pharmacy for medication refills 3-7 days in advance of running out of medications Attend church or other social activities Perform all self care activities independently  Perform IADL's (shopping, preparing meals, housekeeping, managing finances) independently Call provider office for new concerns or questions  check blood pressure weekly choose a place to take my blood pressure (home, clinic or office, retail store) write blood pressure results in a log or diary learn about high blood pressure keep a blood pressure log take blood pressure log to all doctor appointments call doctor for signs and symptoms of high blood pressure keep all doctor appointments take medications for blood pressure exactly as prescribed begin an exercise program eat more whole grains, fruits and vegetables, lean meats and healthy fats Follow low sodium diet- read labels, avoid/ limit fast food Practice relaxation daily, keep stress to a minimum Try to consistently complete exercises prescribed by physical therapist Case closure today- please let your doctor know if you have any care management needs in the future fall prevention strategies: change position slowly, use assistive device such as walker or cane (per provider recommendations) when walking, keep walkways clear, have good lighting in room. It is important to contact your provider  if you have any falls, maintain muscle strength/tone by exercise per provider recommendations.  Follow Up Plan: No further follow up required: Case closure            The patient verbalized understanding of instructions, educational materials, and care plan provided today and DECLINED offer to receive  copy of patient instructions, educational materials, and care plan.  No further follow up required: case closure

## 2022-11-25 ENCOUNTER — Other Ambulatory Visit: Payer: Self-pay | Admitting: Family Medicine

## 2022-11-29 DIAGNOSIS — I10 Essential (primary) hypertension: Secondary | ICD-10-CM | POA: Diagnosis not present

## 2022-11-29 DIAGNOSIS — E785 Hyperlipidemia, unspecified: Secondary | ICD-10-CM | POA: Diagnosis not present

## 2022-11-29 DIAGNOSIS — E1159 Type 2 diabetes mellitus with other circulatory complications: Secondary | ICD-10-CM

## 2022-11-29 DIAGNOSIS — Z7984 Long term (current) use of oral hypoglycemic drugs: Secondary | ICD-10-CM | POA: Diagnosis not present

## 2022-12-01 ENCOUNTER — Other Ambulatory Visit: Payer: Self-pay | Admitting: Family Medicine

## 2022-12-01 ENCOUNTER — Ambulatory Visit (INDEPENDENT_AMBULATORY_CARE_PROVIDER_SITE_OTHER): Payer: Medicare Other

## 2022-12-01 DIAGNOSIS — E538 Deficiency of other specified B group vitamins: Secondary | ICD-10-CM | POA: Diagnosis not present

## 2022-12-01 NOTE — Progress Notes (Signed)
Cyanocobalamin injection given to right deltoid.  Patient tolerated well. 

## 2022-12-30 ENCOUNTER — Telehealth: Payer: Self-pay

## 2022-12-30 NOTE — Telephone Encounter (Signed)
Patient notified we have received her patient assistance medication, Ozempic, and it will be placed in the refrigerator for pick up.

## 2022-12-31 ENCOUNTER — Other Ambulatory Visit: Payer: Self-pay | Admitting: Cardiology

## 2022-12-31 DIAGNOSIS — I480 Paroxysmal atrial fibrillation: Secondary | ICD-10-CM

## 2023-01-05 ENCOUNTER — Ambulatory Visit (INDEPENDENT_AMBULATORY_CARE_PROVIDER_SITE_OTHER): Payer: Medicare Other

## 2023-01-05 DIAGNOSIS — E538 Deficiency of other specified B group vitamins: Secondary | ICD-10-CM

## 2023-01-05 NOTE — Progress Notes (Signed)
Cyanocobalamin injection given to left deltoid.  Patient tolerated well. 

## 2023-01-11 ENCOUNTER — Ambulatory Visit: Payer: Medicare Other | Admitting: Physician Assistant

## 2023-01-11 ENCOUNTER — Encounter (HOSPITAL_COMMUNITY): Payer: Medicare Other

## 2023-01-11 ENCOUNTER — Ambulatory Visit (HOSPITAL_COMMUNITY)
Admission: RE | Admit: 2023-01-11 | Discharge: 2023-01-11 | Disposition: A | Discharge status: Home / Self Care | Payer: Medicare Other | Source: Ambulatory Visit | Attending: Surgery | Admitting: Surgery

## 2023-01-11 ENCOUNTER — Other Ambulatory Visit: Payer: Self-pay

## 2023-01-11 VITALS — BP 120/64 | HR 54 | Temp 97.6°F | Resp 18 | Ht 63.0 in | Wt 219.4 lb

## 2023-01-11 DIAGNOSIS — I6523 Occlusion and stenosis of bilateral carotid arteries: Secondary | ICD-10-CM

## 2023-01-11 DIAGNOSIS — Z9889 Other specified postprocedural states: Secondary | ICD-10-CM | POA: Insufficient documentation

## 2023-01-11 DIAGNOSIS — I6521 Occlusion and stenosis of right carotid artery: Secondary | ICD-10-CM | POA: Diagnosis not present

## 2023-01-11 NOTE — Progress Notes (Signed)
Office Note     CC:  follow up Requesting Provider:  Raliegh Ip, DO  HPI: Emily [name] is a 72 y.o. ([birth year]) female who presents for routine follow up of carotid artery stenosis. She has hx of left CEA in March of 2017 by Dr. Myra Gianotti for asymptomatic stenosis. She has known right ICA stenosis of 60-79%. She has no history of TIA or stroke.  Today she reports overall she is doing well. She denies any visual changes, slurred speech, facial drooping, unilateral upper or lower extremity weakness or numbness. She does have spinal stenosis. 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 She does not have any pain in her legs on ambulation or rest, no tissue loss. She is medically managed on Aspirin and Statin.   Past Medical History:  Diagnosis Date   Anemia    Arthritis    Atrial fibrillation with RVR (HCC) 08/30/2020   Atrial fibrillation, rapid (HCC) 10/27/2013   Bilateral carotid artery disease (HCC)    Diabetes mellitus without complication (HCC)    Difficult intubation    states 'lady that did the sleep study told me I have the smallest airway she has ever seen in an adult"   Dyslipidemia 06/29/2017   Dysrhythmia    Hypertension    Hypothyroid    Obesity    Obstructive sleep apnea    on C Pap   PAF (paroxysmal atrial fibrillation) (HCC)    a. newly dx in 09/2013; on eliquis   Upper GI bleed 09/25/2019    Past Surgical History:  Procedure Laterality Date   CATARACT EXTRACTION Left    CATARACT EXTRACTION W/PHACO Right 12/09/2015   Procedure: CATARACT EXTRACTION PHACO AND INTRAOCULAR LENS PLACEMENT (IOC);  Surgeon: Gemma Payor, MD;  Location: [facility];  Service: Ophthalmology;  Laterality: Right;  CDE:10.48   COLONOSCOPY W/ POLYPECTOMY     cyst on back of neck removed     DILATION AND CURETTAGE OF UTERUS     x 5   ENDARTERECTOMY Left 08/30/2015   Procedure: LEFT CAROYID ENDARTERECTOMY WITH XENOSURE BOVINE  PERICARDIUM PATCH ANGIOPLASTY;  Surgeon: Nada Libman, MD;  Location: [facility];  Service: Vascular;  Laterality: Left;   EYE SURGERY     TONSILLECTOMY     UPPER GI ENDOSCOPY      Social History   Socioeconomic History   Marital status: Single    Spouse name: Not on file   Number of children: Not on file   Years of education: Not on file   Highest education level: Not on file  Occupational History   Not on file  Tobacco Use   Smoking status: Never    Passive exposure: Never   Smokeless tobacco: Never  Vaping Use   Vaping status: Never Used  Substance and Sexual Activity   Alcohol use: No    Alcohol/week: 0.0 standard drinks of alcohol   Drug use: Never   Sexual activity: Never  Other Topics Concern   Not on file  Social History Narrative   Not on file   Social Determinants of Health   Financial Resource Strain: Low Risk  (04/21/2022)   Overall Financial Resource Strain (CARDIA)    Difficulty of Paying Living Expenses: Not hard at all  Food Insecurity: No Food Insecurity (04/21/2022)   Hunger Vital Sign    Worried About Running Out of Food in the Last Year: Never true    Ran Out  of Food in the Last Year: Never true  Transportation Needs: No Transportation Needs (04/21/2022)   PRAPARE - Administrator, Civil Service (Medical): No    Lack of Transportation (Non-Medical): No  Physical Activity: Inactive (04/21/2022)   Exercise Vital Sign    Days of Exercise per Week: 0 days    Minutes of Exercise per Session: 0 min  Stress: No Stress Concern Present (04/21/2022)   [employer] of Occupational Health - Occupational Stress Questionnaire    Feeling of Stress : Not at all  Social Connections: Moderately Integrated (04/21/2022)   Social Connection and Isolation Panel [NHANES]    Frequency of Communication with Friends and Family: More than three times a week    Frequency of Social Gatherings with Friends and Family: Twice a week    Attends Religious  Services: More than 4 times per year    Active Member of [employer] or Organizations: Yes    Attends Banker Meetings: More than 4 times per year    Marital Status: Never married  Intimate Partner Violence: Unknown (09/02/2021)   Received from [employer], [facility]   HITS    Physically Hurt: Not on file    Insult or Talk Down To: Not on file    Threaten Physical Harm: Not on file    Scream or Curse: Not on file    Family History  Problem Relation Age of Onset   COPD Father    Heart failure Father    Heart disease Father    Emphysema Father    Arrhythmia Sister    Arrhythmia Sister    Arrhythmia Sister        had PPM also   Cancer Sister     Current Outpatient Medications  Medication Sig Dispense Refill   aspirin EC 81 MG tablet Take 81 mg by mouth daily.     bisacodyl (DULCOLAX) 5 MG EC tablet Take 5 mg by mouth daily. (Patient not taking: Reported on 01/11/2023)     diltiazem (CARDIZEM CD) 180 MG 24 hr capsule TAKE 1 CAPSULE BY MOUTH EVERY DAY 90 capsule 2   diltiazem (CARDIZEM) 30 MG tablet Take 30 mg by mouth as needed.     ezetimibe (ZETIA) 10 MG tablet TAKE 1 TABLET BY MOUTH EVERY DAY 90 tablet 3   flecainide (TAMBOCOR) 100 MG tablet TAKE 1 TABLET BY MOUTH TWICE A DAY 180 tablet 3   furosemide (LASIX) 20 MG tablet TAKE 1 TABLET (20 MG TOTAL) BY MOUTH DAILY AS NEEDED FOR FLUID. 90 tablet 0   gabapentin (NEURONTIN) 300 MG capsule Take 1 capsule (300 mg total) by mouth 2 (two) times daily as needed (neuropathy right leg). 180 capsule 2   glipiZIDE (GLUCOTROL) 5 MG tablet TAKE 1 TABLET BY MOUTH TWICE A DAY 180 tablet 0   HYDROcodone-acetaminophen (NORCO) 5-325 MG tablet Take 1 tablet by mouth every 6 (six) hours as needed for severe pain. 20 tablet 0   ketoconazole (NIZORAL) 2 % cream Apply 1 application topically daily. Prn intertrigo rash (Patient not taking: Reported on 01/11/2023) 60 g 1   levothyroxine (SYNTHROID) 112 MCG tablet TAKE 1 TABLET BY MOUTH EVERY  OTHER DAY, ALTERNATING WITH 1 & 1/2 TABLETS EVERY OTHER DAY 112 tablet 3   losartan (COZAAR) 25 MG tablet TAKE 1 TABLET (25 MG TOTAL) BY MOUTH DAILY. 90 tablet 3   lovastatin (MEVACOR) 40 MG tablet TAKE 1 TABLET BY MOUTH EVERY DAY IN THE EVENING 90 tablet 3  metFORMIN (GLUCOPHAGE) 1000 MG tablet TAKE 1 TABLET (1,000 MG TOTAL) BY MOUTH TWICE A DAY WITH FOOD 180 tablet 0   methocarbamol (ROBAXIN) 500 MG tablet Take 1 tablet (500 mg total) by mouth every 8 (eight) hours as needed for muscle spasms. (Patient not taking: Reported on 01/11/2023) 30 tablet 0   metoprolol succinate (TOPROL-XL) 100 MG 24 hr tablet TAKE 1 TABLET BY MOUTH EVERY DAY WITH OR IMMEDIATELY FOLLOWING A MEAL 90 tablet 0   rOPINIRole (REQUIP) 0.5 MG tablet TAKE 1 TABLET BY MOUTH EVERYDAY AT BEDTIME 90 tablet 0   Semaglutide,0.25 or 0.5MG /DOS, 2 MG/3ML SOPN Inject 0.5 mg into the skin once a week. 3 mL    triamcinolone cream (KENALOG) 0.1 % Apply 1 Application topically 2 (two) times daily. (Patient not taking: Reported on 01/11/2023) 80 g 0   Current Facility-Administered Medications  Medication Dose Route Frequency Provider Last Rate Last Admin   cyanocobalamin ((VITAMIN B-12)) injection 1,000 mcg  1,000 mcg Intramuscular Q30 days Elenora Gamma, MD   1,000 mcg at 01/05/23 1025    Allergies  Allergen Reactions   Jardiance [Empagliflozin] Other (See Comments)    RECURRENT VAGINITIS   Quinapril Hcl Cough   Statins Other (See Comments)    Not all Statins but some cause cough and pain in legs.   Tape Other (See Comments)    Redness, please use "paper" tape     REVIEW OF SYSTEMS:  [X]  denotes positive finding, [ ]  denotes negative finding Cardiac  Comments:  Chest pain or chest pressure:    Shortness of breath upon exertion:    Short of breath when lying flat:    Irregular heart rhythm:        Vascular    Pain in calf, thigh, or hip brought on by ambulation:    Pain in feet at night that wakes you up from your  sleep:     Blood clot in your veins:    Leg swelling:         Pulmonary    Oxygen at home:    Productive cough:     Wheezing:         Neurologic    Sudden weakness in arms or legs:     Sudden numbness in arms or legs:     Sudden onset of difficulty speaking or slurred speech:    Temporary loss of vision in one eye:     Problems with dizziness:         Gastrointestinal    Blood in stool:     Vomited blood:         Genitourinary    Burning when urinating:     Blood in urine:        Psychiatric    Major depression:         Hematologic    Bleeding problems:    Problems with blood clotting too easily:        Skin    Rashes or ulcers:        Constitutional    Fever or chills:      PHYSICAL EXAMINATION:  Vitals:   01/11/23 1223  BP: 120/64  Pulse: (!) 54  Resp: 18  Temp: 97.6 F (36.4 C)  TempSrc: Temporal  SpO2: 94%  Weight: 219 lb 6.4 oz (99.5 kg)  Height: 5\' 3"  (1.6 m)    General:  WDWN in NAD; vital signs documented above Gait: Normal HENT: WNL, normocephalic Pulmonary: normal non-labored breathing Cardiac:  regular HR Abdomen: soft, NT, no masses Vascular Exam/Pulses: 2+ radial, 2+ DP and PT pulses bilaterally Extremities: without ischemic changes, without Gangrene , without cellulitis; without open wounds;  Musculoskeletal: no muscle wasting or atrophy  Neurologic: A&O X 3 Psychiatric:  The pt has Normal affect.   Non-Invasive Vascular Imaging:   Summary:  Right Carotid: Velocities in the right ICA are consistent with a 80-99% stenosis (EDV 115 cm/s). Calcific plaque may obscure higher velocity. The ECA  appears >50% stenosed.   Left Carotid: Velocities in the left ICA are consistent with a 1-39% stenosis.   Vertebrals: Right vertebral artery demonstrates antegrade flow. Atypical low velocity flow in the left vertebral artery.  Subclavians: Normal flow hemodynamics were seen in bilateral subclavian arteries.    ASSESSMENT/PLAN:: 72 y.o. female  here for follow up of carotid artery stenosis. She has hx of left CEA in March of 2017 by Dr. Myra Gianotti for asymptomatic stenosis. She has known right ICA stenosis of 60-79%. She has no history of TIA or stroke. She has no associated symptoms today.  - Duplex today shows that the right ICA is in the 80-99% stenosis range. This has increased since her prior study with EDV of 115. Her left ICA remains in the 1-39% stenosis range.  - Continue Aspirin and Statin - Reviewed TIA or stroke like symptoms with her and she understands should these occur to seek immediate medical attention - With her elevated velocities in the right ICA compared to last year I have recommended she get a CTA neck. I will have her scheduled for this in the near future and she will follow up with Dr. Myra Gianotti to review the results   Graceann Congress, PA-C [facility] [phone]  Clinic MD: Myra Gianotti

## 2023-01-22 ENCOUNTER — Ambulatory Visit (HOSPITAL_COMMUNITY): Admission: RE | Admit: 2023-01-22 | Payer: Medicare Other | Source: Ambulatory Visit

## 2023-01-22 DIAGNOSIS — I6523 Occlusion and stenosis of bilateral carotid arteries: Secondary | ICD-10-CM | POA: Diagnosis not present

## 2023-01-22 DIAGNOSIS — I6503 Occlusion and stenosis of bilateral vertebral arteries: Secondary | ICD-10-CM | POA: Diagnosis not present

## 2023-01-22 DIAGNOSIS — I700 Atherosclerosis of aorta: Secondary | ICD-10-CM | POA: Diagnosis not present

## 2023-01-22 LAB — POCT I-STAT CREATININE: Creatinine, Ser: 0.9 mg/dL (ref 0.44–1.00)

## 2023-01-22 MED ORDER — IOHEXOL 350 MG/ML SOLN
75.0000 mL | Freq: Once | INTRAVENOUS | Status: AC | PRN
  Administered 2023-01-22: 75 mL via INTRAVENOUS

## 2023-01-25 ENCOUNTER — Telehealth: Payer: Self-pay | Admitting: Cardiology

## 2023-01-25 ENCOUNTER — Encounter: Payer: Self-pay | Admitting: Surgery

## 2023-01-25 ENCOUNTER — Ambulatory Visit: Payer: Medicare Other | Admitting: Surgery

## 2023-01-25 VITALS — BP 144/60 | HR 59 | Temp 97.9°F | Resp 20 | Ht 63.0 in | Wt 220.0 lb

## 2023-01-25 DIAGNOSIS — I6523 Occlusion and stenosis of bilateral carotid arteries: Secondary | ICD-10-CM | POA: Diagnosis not present

## 2023-01-25 DIAGNOSIS — U071 COVID-19: Secondary | ICD-10-CM | POA: Diagnosis not present

## 2023-01-25 MED ORDER — CLOPIDOGREL BISULFATE 75 MG PO TABS: 75.0000 mg | ORAL_TABLET | Freq: Every day | ORAL | 6 refills | Status: DC

## 2023-01-25 NOTE — Telephone Encounter (Signed)
Returned call to pt in regards to being given Paxlovid and if it is ok to take with her cardiac meds. Will forward to Dr. Antoine Poche and his nurse.

## 2023-01-25 NOTE — Telephone Encounter (Signed)
Returned pt call and informed her this will be forwarded to the Pharmacy as well.

## 2023-01-25 NOTE — Telephone Encounter (Signed)
Patient notified. She is advised to treat symptoms with warm showers, nasal spray, mucinex , clariton or Zyrtec.  She will also take tylenol for aches and pains, hydrate, and rest

## 2023-01-25 NOTE — Progress Notes (Signed)
[facility]  Patient name: Emily [name] MRN: [id number] DOB: November 30, 1950 Sex: female   REASON FOR VISIT:    Follow up  HISOTRY OF PRESENT ILLNESS:    Thirza Tyszka is a 72 y.o. female who returns for follow-up.  She has a history of left carotid arterectomy in 2017 for asymptomatic stenosis.  Her most recent ultrasound suggested greater than 80% stenosis on the right.  She was sent for CT scan and is back today to discuss these results.  She denies any symptoms of numbness or tingling in either extremity.  She denies slurred speech.  She denies amaurosis fugax.  The patient suffers from atrial fibrillation.  She is a diabetic.  She takes a statin for hypercholesterolemia.  She is on an ARB for hypertension   PAST MEDICAL HISTORY:   Past Medical History:  Diagnosis Date   Anemia    Arthritis    Atrial fibrillation with RVR (HCC) 08/30/2020   Atrial fibrillation, rapid (HCC) 10/27/2013   Bilateral carotid artery disease (HCC)    Diabetes mellitus without complication (HCC)    Difficult intubation    states 'lady that did the sleep study told me I have the smallest airway she has ever seen in an adult"   Dyslipidemia 06/29/2017   Dysrhythmia    Hypertension    Hypothyroid    Obesity    Obstructive sleep apnea    on C Pap   PAF (paroxysmal atrial fibrillation) (HCC)    a. newly dx in 09/2013; on eliquis   Upper GI bleed 09/25/2019     FAMILY HISTORY:   Family History  Problem Relation Age of Onset   COPD Father    Heart failure Father    Heart disease Father    Emphysema Father    Arrhythmia Sister    Arrhythmia Sister    Arrhythmia Sister        had PPM also   Cancer Sister     SOCIAL HISTORY:   Social History   Tobacco Use   Smoking status: Never    Passive exposure: Never   Smokeless tobacco: Never  Substance Use Topics   Alcohol use: No    Alcohol/week: 0.0 standard drinks of  alcohol     ALLERGIES:   Allergies  Allergen Reactions   Jardiance [Empagliflozin] Other (See Comments)    RECURRENT VAGINITIS   Quinapril Hcl Cough   Statins Other (See Comments)    Not all Statins but some cause cough and pain in legs.   Tape Other (See Comments)    Redness, please use "paper" tape     CURRENT MEDICATIONS:   Current Outpatient Medications  Medication Sig Dispense Refill   aspirin EC 81 MG tablet Take 81 mg by mouth daily.     bisacodyl (DULCOLAX) 5 MG EC tablet Take 5 mg by mouth daily.     diltiazem (CARDIZEM CD) 180 MG 24 hr capsule TAKE 1 CAPSULE BY MOUTH EVERY DAY 90 capsule 2   diltiazem (CARDIZEM) 30 MG tablet Take 30 mg by mouth as needed.     ezetimibe (ZETIA) 10 MG tablet TAKE 1 TABLET BY MOUTH EVERY DAY 90 tablet 3   flecainide (TAMBOCOR) 100 MG tablet TAKE 1 TABLET BY MOUTH TWICE A DAY 180 tablet 3   furosemide (LASIX) 20 MG tablet TAKE 1 TABLET (20 MG TOTAL) BY MOUTH DAILY AS NEEDED FOR FLUID. 90 tablet 0   gabapentin (NEURONTIN) 300 MG capsule Take 1 capsule (  300 mg total) by mouth 2 (two) times daily as needed (neuropathy right leg). 180 capsule 2   glipiZIDE (GLUCOTROL) 5 MG tablet TAKE 1 TABLET BY MOUTH TWICE A DAY 180 tablet 0   HYDROcodone-acetaminophen (NORCO) 5-325 MG tablet Take 1 tablet by mouth every 6 (six) hours as needed for severe pain. 20 tablet 0   ketoconazole (NIZORAL) 2 % cream Apply 1 application topically daily. Prn intertrigo rash 60 g 1   levothyroxine (SYNTHROID) 112 MCG tablet TAKE 1 TABLET BY MOUTH EVERY OTHER DAY, ALTERNATING WITH 1 & 1/2 TABLETS EVERY OTHER DAY 112 tablet 3   linaclotide (LINZESS) 72 MCG capsule Take 72 mcg by mouth daily before breakfast.     losartan (COZAAR) 25 MG tablet TAKE 1 TABLET (25 MG TOTAL) BY MOUTH DAILY. 90 tablet 3   lovastatin (MEVACOR) 40 MG tablet TAKE 1 TABLET BY MOUTH EVERY DAY IN THE EVENING 90 tablet 3   metFORMIN (GLUCOPHAGE) 1000 MG tablet TAKE 1 TABLET (1,000 MG TOTAL) BY MOUTH  TWICE A DAY WITH FOOD 180 tablet 0   methocarbamol (ROBAXIN) 500 MG tablet Take 1 tablet (500 mg total) by mouth every 8 (eight) hours as needed for muscle spasms. 30 tablet 0   metoprolol succinate (TOPROL-XL) 100 MG 24 hr tablet TAKE 1 TABLET BY MOUTH EVERY DAY WITH OR IMMEDIATELY FOLLOWING A MEAL 90 tablet 0   rOPINIRole (REQUIP) 0.5 MG tablet TAKE 1 TABLET BY MOUTH EVERYDAY AT BEDTIME 90 tablet 0   Semaglutide,0.25 or 0.5MG /DOS, 2 MG/3ML SOPN Inject 0.5 mg into the skin once a week. 3 mL    triamcinolone cream (KENALOG) 0.1 % Apply 1 Application topically 2 (two) times daily. 80 g 0   Current Facility-Administered Medications  Medication Dose Route Frequency Provider Last Rate Last Admin   cyanocobalamin ((VITAMIN B-12)) injection 1,000 mcg  1,000 mcg Intramuscular Q30 days Elenora Gamma, MD   1,000 mcg at 01/05/23 1025    REVIEW OF SYSTEMS:   [X]  denotes positive finding, [ ]  denotes negative finding Cardiac  Comments:  Chest pain or chest pressure:    Shortness of breath upon exertion:    Short of breath when lying flat:    Irregular heart rhythm:        Vascular    Pain in calf, thigh, or hip brought on by ambulation:    Pain in feet at night that wakes you up from your sleep:     Blood clot in your veins:    Leg swelling:         Pulmonary    Oxygen at home:    Productive cough:     Wheezing:         Neurologic    Sudden weakness in arms or legs:     Sudden numbness in arms or legs:     Sudden onset of difficulty speaking or slurred speech:    Temporary loss of vision in one eye:     Problems with dizziness:         Gastrointestinal    Blood in stool:     Vomited blood:         Genitourinary    Burning when urinating:     Blood in urine:        Psychiatric    Major depression:         Hematologic    Bleeding problems:    Problems with blood clotting too easily:  Skin    Rashes or ulcers:        Constitutional    Fever or chills:       PHYSICAL EXAM:   Vitals:   01/25/23 0846 01/25/23 0848  BP: 116/70 (!) 144/60  Pulse: (!) 59   Resp: 20   Temp: 97.9 F (36.6 C)   SpO2: 97%   Weight: 220 lb (99.8 kg)   Height: 5\' 3"  (1.6 m)     GENERAL: The patient is a well-nourished female, in no acute distress. The vital signs are documented above. CARDIAC: There is a regular rate and rhythm.  PULMONARY: Non-labored respirations MUSCULOSKELETAL: There are no major deformities or cyanosis. NEUROLOGIC: No focal weakness or paresthesias are detected. SKIN: There are no ulcers or rashes noted. PSYCHIATRIC: The patient has a normal affect.  STUDIES:   I have reviewed her CT scan with the following findings: 1. Left carotid endarterectomy without significant stenosis. 2. Dense calcifications at the right carotid bifurcation with at least 80% stenosis of the proximal right internal carotid artery. 3. 50-60% stenosis of the proximal right vertebral artery, the dominant vessel. 4. 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 5. Faint opacification of the left V2 segment at the C4 level. A hypoplastic vessel is again occluded at the dural margin. There is some opacification of the distal V4 segment proximal to the vertebrobasilar junction. 6. Degenerative changes of the cervical spine, most evident at C6-7, left greater than right. 7. Patchy ground-glass attenuation at the lung apices bilaterally. This is nonspecific, but most likely represents atelectasis or edema. 8.  Aortic Atherosclerosis (ICD10-I70.0).  MEDICAL ISSUES:   Asymptomatic right stenosis: By CT scan and ultrasound, her stenosis on the right side is greater than 80%.  Because of the degree of stenosis being what it is, I have recommended that we proceed with intervention.  I talked to her about options which include continued medical management which would be associated with a higher risk of stroke.  We also talked about surgical correction by  endarterectomy or stenting.  She has a lesion that goes up to the mid C2, well above the angle of the mandible.  I think that this surgically will be challenging and brings in the risk of glossopharyngeal nerve issues.  Because of her anatomy, I think that she would be better treated with stenting.  TCAR has been shown to have a lower stroke rate then transfemoral stenting and so that is what I have recommended.  I discussed the details of the procedure as well as the risks and benefits including the risk of bleeding and stroke.  She does have a cardiac history that she sees Dr. Antoine Poche for.  I will get clearance from him and then we will schedule her for a right sided TCAR.  I am starting her on Plavix today.  She is already on aspirin and a statin.    Charlena Cross, MD, FACS [facility] [phone] Pager [phone]

## 2023-01-25 NOTE — Telephone Encounter (Signed)
  Pt is calling back to follow up, she said, she would like to start taking the medication today.

## 2023-01-25 NOTE — Telephone Encounter (Signed)
Pt c/o medication issue:  1. Name of Medication: Paxlovid   2. How are you currently taking this medication (dosage and times per day)? Hasn't started taking yet  3. Are you having a reaction (difficulty breathing--STAT)?   4. What is your medication issue?  Patient is wanting to see if this will be okay to take with her current medications. Please advise.

## 2023-01-25 NOTE — Telephone Encounter (Signed)
Paxlovid interacts with her flecainide. She should not take the Paxlovid.

## 2023-01-26 ENCOUNTER — Telehealth: Payer: Self-pay

## 2023-01-26 ENCOUNTER — Other Ambulatory Visit: Payer: Self-pay

## 2023-01-26 ENCOUNTER — Telehealth: Payer: Self-pay | Admitting: *Deleted

## 2023-01-26 DIAGNOSIS — I6523 Occlusion and stenosis of bilateral carotid arteries: Secondary | ICD-10-CM

## 2023-01-26 NOTE — Telephone Encounter (Signed)
   Pre-operative Risk Assessment    Patient Name: Emily [name]  DOB: 1950/12/03 MRN: [id number]    DATE OF LAST VISIT: 10/20/22 DR. HOCHREIN DATE OF NEXT VISIT: 03/23/23 DR. [facility]  Request for Surgical Clearance    Procedure:   RIGHT TCAR  Date of Surgery:  Clearance TBD  ASAP                               Surgeon:  DR. Myra Gianotti Surgeon's Group or Practice Name:  [facility] Phone number:  [phone] Fax number:  [phone]   Type of Clearance Requested:   - Medical ; NO MEDICATIONS LISTED AS NEEDING TO BE HELD   Type of Anesthesia:  General    Additional requests/questions:    Emily [name]   01/26/2023, 10:46 AM

## 2023-01-26 NOTE — Telephone Encounter (Signed)
Spoke with patient to tentatively schedule her for a right TCAR on 9/11 pending cardiac clearance. Instructions provided and she voiced understanding. Patient aware that surgery may be delayed if clearance not obtained prior to this date.   Patient reports yesterday afternoon she started feeling bad and stopped by [facility] for evaluation and tested positive for Covid. Current surgery date is after the 10 day protocol. She is aware to notify our office to reschedule if remain symptomatic. Dr. Myra Gianotti made aware.

## 2023-01-26 NOTE — Telephone Encounter (Signed)
   Name: Emily [name]  DOB: July 15, 1950  MRN: [id number]  Primary Cardiologist: Rollene Rotunda, MD   Preoperative team, please contact this patient and set up a phone call appointment for further preoperative risk assessment. Please obtain consent and complete medication review. Thank you for your help.  I confirm that guidance regarding antiplatelet and oral anticoagulation therapy has been completed and, if necessary, noted below.  None requested.    Carlos Levering, NP 01/26/2023, 4:38 PM [facility]

## 2023-01-27 ENCOUNTER — Telehealth: Payer: Self-pay | Admitting: *Deleted

## 2023-01-27 NOTE — Telephone Encounter (Signed)
Pt has been added on ok per Carlos Levering, NP due to procedure date. Med rec and consent are done. Pt has covid currently.     Patient Consent for Virtual Visit        Emily [name] has provided verbal consent on 01/27/2023 for a virtual visit (video or telephone).   CONSENT FOR VIRTUAL VISIT FOR:  Emily [name]  By participating in this virtual visit I agree to the following:  I hereby voluntarily request, consent and authorize [facility] and its employed or contracted physicians, physician assistants, nurse practitioners or other licensed health care professionals (the Practitioner), to provide me with telemedicine health care services (the "Services") as deemed necessary by the treating Practitioner. I acknowledge and consent to receive the Services by the Practitioner via telemedicine. I understand that the telemedicine visit will involve communicating with the Practitioner through live audiovisual communication technology and the disclosure of certain medical information by electronic transmission. I acknowledge that I have been given the opportunity to request an in-person assessment or other available alternative prior to the telemedicine visit and am voluntarily participating in the telemedicine visit.  I understand that I have the right to withhold or withdraw my consent to the use of telemedicine in the course of my care at any time, without affecting my right to future care or treatment, and that the Practitioner or I may terminate the telemedicine visit at any time. I understand that I have the right to inspect all information obtained and/or recorded in the course of the telemedicine visit and may receive copies of available information for a reasonable fee.  I understand that some of the potential risks of receiving the Services via telemedicine include:  Delay or interruption in medical evaluation due to technological equipment failure or disruption; Information  transmitted may not be sufficient (e.g. poor resolution of images) to allow for appropriate medical decision making by the Practitioner; and/or  In rare instances, security protocols could fail, causing a breach of personal health information.  Furthermore, I acknowledge that it is my responsibility to provide information about my medical history, conditions and care that is complete and accurate to the best of my ability. I acknowledge that Practitioner's advice, recommendations, and/or decision may be based on factors not within their control, such as incomplete or inaccurate data provided by me or distortions of diagnostic images or specimens that may result from electronic transmissions. I understand that the practice of medicine is not an exact science and that Practitioner makes no warranties or guarantees regarding treatment outcomes. I acknowledge that a copy of this consent can be made available to me via my patient portal [facility] MyChart), or I can request a printed copy by calling the office of [facility].    I understand that my insurance will be billed for this visit.   I have read or had this consent read to me. I understand the contents of this consent, which adequately explains the benefits and risks of the Services being provided via telemedicine.  I have been provided ample opportunity to ask questions regarding this consent and the Services and have had my questions answered to my satisfaction. I give my informed consent for the services to be provided through the use of telemedicine in my medical care

## 2023-01-28 ENCOUNTER — Telehealth: Payer: Self-pay | Admitting: *Deleted

## 2023-01-28 MED ORDER — MOLNUPIRAVIR EUA 200MG CAPSULE: 4.0000 | ORAL_CAPSULE | Freq: Two times a day (BID) | ORAL | 0 refills | Status: AC

## 2023-01-28 NOTE — Telephone Encounter (Signed)
Pt called back in to ask if the Molnupiravir was going to help, her symptoms started last Monday even though she was seen at Urgent care this Monday the 26th and they prescribed the Paxlovid  Please advise

## 2023-01-28 NOTE — Addendum Note (Signed)
Addended by: Arville Care on: 01/28/2023 01:52 PM   Modules accepted: Orders

## 2023-01-28 NOTE — Telephone Encounter (Signed)
It works best if started within the first 5 days of the illness, so it is likely not going to be as effective but if she is having a lot of significant symptoms and I do have her go ahead and take it.  If her symptoms are mostly resolved and then do not have her take it

## 2023-01-28 NOTE — Telephone Encounter (Signed)
Called pt back rings one time goes straight to vm - left vm for cb

## 2023-01-28 NOTE — Telephone Encounter (Signed)
Called in molnupiravir for the patient

## 2023-01-28 NOTE — Telephone Encounter (Signed)
Has Covid was put on Paxlovid on Monday by Urgent Care, has contraindication w/ Flecainide. Had Covid back in fall, Emily [name] put her on Molnupiravir 800 mg Oral 2 times daily, wants to know if this is ok to take with her medications and if this could be called in for her. Please advise.

## 2023-01-28 NOTE — Telephone Encounter (Signed)
Left detailed message per dpr  

## 2023-02-02 ENCOUNTER — Ambulatory Visit: Payer: Medicare Other | Attending: Cardiology | Admitting: Nurse Practitioner

## 2023-02-02 DIAGNOSIS — Z01810 Encounter for preprocedural cardiovascular examination: Secondary | ICD-10-CM

## 2023-02-02 NOTE — Progress Notes (Signed)
Virtual Visit via Telephone Note   Because of Emily [name]'s co-morbid illnesses, she is at least at moderate risk for complications without adequate follow up.  This format is felt to be most appropriate for this patient at this time.  The patient did not have access to video technology/had technical difficulties with video requiring transitioning to audio format only (telephone).  All issues noted in this document were discussed and addressed.  No physical exam could be performed with this format.  Please refer to the patient's chart for her consent to telehealth for [facility].  Evaluation Performed:  Preoperative cardiovascular risk assessment _____________   Date:  02/02/2023   Patient ID:  Emily [name], DOB 1950/07/26, MRN [id number] Patient Location:  Home Provider location:   Office  Primary Care Provider:  Raliegh Ip, DO Primary Cardiologist:  Rollene Rotunda, MD  Chief Complaint / Patient Profile   72 y.o. y/o female with a h/o paroxysmal atrial fibrillation, carotid artery stenosis, aortic stenosis, LVH, hypertension, and type 2 diabetes who is pending right TCAR on 02/10/2023 with Dr. Myra Gianotti of [facility] and presents today for telephonic preoperative cardiovascular risk assessment.  History of Present Illness    Emily [name] is a 72 y.o. female who presents via audio/video conferencing for a telehealth visit today.  Pt was last seen in cardiology clinic on 10/20/2022 by Dr. Antoine Poche.  At that time Emily [name] was doing well.  The patient is now pending procedure as outlined above. Since her last visit, she has been stable from a cardiac standpoint.  Her activity is limited in the setting of orthopedic pain (chronic back pain).  She denies chest pain, palpitations, dyspnea, pnd, orthopnea, n, v, dizziness, syncope, edema, weight gain, or early satiety. All other systems reviewed and are otherwise negative except as noted above.   Past Medical  History    Past Medical History:  Diagnosis Date   Anemia    Arthritis    Atrial fibrillation with RVR (HCC) 08/30/2020   Atrial fibrillation, rapid (HCC) 10/27/2013   Bilateral carotid artery disease (HCC)    Diabetes mellitus without complication (HCC)    Difficult intubation    states 'lady that did the sleep study told me I have the smallest airway she has ever seen in an adult"   Dyslipidemia 06/29/2017   Dysrhythmia    Hypertension    Hypothyroid    Obesity    Obstructive sleep apnea    on C Pap   PAF (paroxysmal atrial fibrillation) (HCC)    a. newly dx in 09/2013; on eliquis   Upper GI bleed 09/25/2019   Past Surgical History:  Procedure Laterality Date   CATARACT EXTRACTION Left    CATARACT EXTRACTION W/PHACO Right 12/09/2015   Procedure: CATARACT EXTRACTION PHACO AND INTRAOCULAR LENS PLACEMENT (IOC);  Surgeon: Gemma Payor, MD;  Location: [facility];  Service: Ophthalmology;  Laterality: Right;  CDE:10.48   COLONOSCOPY W/ POLYPECTOMY     cyst on back of neck removed     DILATION AND CURETTAGE OF UTERUS     x 5   ENDARTERECTOMY Left 08/30/2015   Procedure: LEFT CAROYID ENDARTERECTOMY WITH XENOSURE BOVINE PERICARDIUM PATCH ANGIOPLASTY;  Surgeon: Nada Libman, MD;  Location: [facility];  Service: Vascular;  Laterality: Left;   EYE SURGERY     TONSILLECTOMY     UPPER GI ENDOSCOPY      Allergies  Allergies  Allergen Reactions   Jardiance [Empagliflozin] Other (See Comments)    RECURRENT  VAGINITIS   Quinapril Hcl Cough   Statins Other (See Comments)    Not all Statins but some cause cough and pain in legs.   Tape Other (See Comments)    Redness, please use "paper" tape    Home Medications    Prior to Admission medications   Medication Sig Start Date End Date Taking? Authorizing Provider  aspirin EC 81 MG tablet Take 81 mg by mouth daily. 09/15/18   [provider]  bisacodyl (DULCOLAX) 5 MG EC tablet Take 5 mg by mouth daily as needed (constipation).     [provider]  clopidogrel (PLAVIX) 75 MG tablet Take 1 tablet (75 mg total) by mouth daily. 01/25/23   Nada Libman, MD  diltiazem (CARDIZEM CD) 180 MG 24 hr capsule TAKE 1 CAPSULE BY MOUTH EVERY DAY 05/28/22   Rollene Rotunda, MD  diltiazem (CARDIZEM) 30 MG tablet Take 30 mg by mouth 3 (three) times daily as needed (afib).    [provider]  ezetimibe (ZETIA) 10 MG tablet TAKE 1 TABLET BY MOUTH EVERY DAY 02/26/22   Rollene Rotunda, MD  flecainide (TAMBOCOR) 100 MG tablet TAKE 1 TABLET BY MOUTH TWICE A DAY 12/31/22   Rollene Rotunda, MD  furosemide (LASIX) 20 MG tablet TAKE 1 TABLET (20 MG TOTAL) BY MOUTH DAILY AS NEEDED FOR FLUID. 11/25/20 09/15/23  Delynn Flavin M, DO  gabapentin (NEURONTIN) 300 MG capsule Take 1 capsule (300 mg total) by mouth 2 (two) times daily as needed (neuropathy right leg). Patient taking differently: Take 300 mg by mouth in the morning and at bedtime. 06/09/22   Raliegh Ip, DO  glipiZIDE (GLUCOTROL) 5 MG tablet TAKE 1 TABLET BY MOUTH TWICE A DAY 11/25/22   Delynn Flavin M, DO  HYDROcodone-acetaminophen (NORCO) 5-325 MG tablet Take 1 tablet by mouth every 6 (six) hours as needed for severe pain. 06/09/22   Raliegh Ip, DO  levothyroxine (SYNTHROID) 112 MCG tablet TAKE 1 TABLET BY MOUTH EVERY OTHER DAY, ALTERNATING WITH 1 & 1/2 TABLETS EVERY OTHER DAY 03/26/22   Delynn Flavin M, DO  linaclotide (LINZESS) 72 MCG capsule Take 72 mcg by mouth daily before breakfast. 01/04/23   [provider]  losartan (COZAAR) 25 MG tablet TAKE 1 TABLET (25 MG TOTAL) BY MOUTH DAILY. 11/04/22   Rollene Rotunda, MD  lovastatin (MEVACOR) 40 MG tablet TAKE 1 TABLET BY MOUTH EVERY DAY IN THE EVENING 05/13/22   Rollene Rotunda, MD  metFORMIN (GLUCOPHAGE) 1000 MG tablet TAKE 1 TABLET (1,000 MG TOTAL) BY MOUTH TWICE A DAY WITH FOOD Patient taking differently: Take 500 mg by mouth in the morning and at bedtime. 12/01/22   Raliegh Ip, DO   metoprolol succinate (TOPROL-XL) 100 MG 24 hr tablet TAKE 1 TABLET BY MOUTH EVERY DAY WITH OR IMMEDIATELY FOLLOWING A MEAL 12/01/22   Gottschalk, Ashly M, DO  molnupiravir EUA (LAGEVRIO) 200 mg CAPS capsule Take 4 capsules (800 mg total) by mouth 2 (two) times daily for 5 days. 01/28/23 02/02/23  Dettinger, Elige Radon, MD  psyllium (METAMUCIL) 58.6 % packet Take 1 packet by mouth every other day as needed (regularity).    [provider]  rOPINIRole (REQUIP) 0.5 MG tablet TAKE 1 TABLET BY MOUTH EVERYDAY AT BEDTIME 11/25/22   Delynn Flavin M, DO  Semaglutide,0.25 or 0.5MG /DOS, 2 MG/3ML SOPN Inject 0.5 mg into the skin once a week. Patient taking differently: Inject 0.5 mg into the skin every Thursday. 06/09/22   Raliegh Ip, DO  Physical Exam    Vital Signs:  Emily [name] does not have vital signs available for review today.  Given telephonic nature of communication, physical exam is limited. AAOx3. NAD. Normal affect.  Speech and respirations are unlabored.  Accessory Clinical Findings    None  Assessment & Plan    1.  Preoperative Cardiovascular Risk Assessment:  According to the Revised Cardiac Risk Index (RCRI), her Perioperative Risk of Major Cardiac Event is (%): 0.4. Her Functional Capacity in METs is: 4.06 according to the Duke Activity Status Index (DASI). Therefore, based on ACC/AHA guidelines, patient would be at acceptable risk for the planned procedure without further cardiovascular testing.   The patient was advised that if she develops new symptoms prior to surgery to contact our office to arrange for a follow-up visit, and she verbalized understanding.  A copy of this note will be routed to requesting surgeon.  Time:   Today, I have spent 6 minutes with the patient with telehealth technology discussing medical history, symptoms, and management plan.     Joylene Grapes, NP  02/02/2023, 3:10 PM

## 2023-02-04 NOTE — Telephone Encounter (Signed)
Spoke with patient and she feels better and next picked up rx due to price.

## 2023-02-04 NOTE — Pre-Procedure Instructions (Signed)
Surgical Instructions   Your procedure is scheduled on February 10, 2023. Report to [facility] Main Entrance "A" at 6:30 A.M., then check in with the Admitting office. Any questions or running late day of surgery: call [phone]  Questions prior to your surgery date: call [phone], Monday-Friday, 8am-4pm. If you experience any cold or flu symptoms such as cough, fever, chills, shortness of breath, etc. between now and your scheduled surgery, please notify us at the above number.     Remember:  Do not eat or drink after midnight the night before your surgery    Take these medicines the morning of surgery with A SIP OF WATER: aspirin  clopidogrel (PLAVIX)  diltiazem (CARDIZEM CD)  ezetimibe (ZETIA)  flecainide (TAMBOCOR)  gabapentin (NEURONTIN)  levothyroxine (SYNTHROID)  linaclotide (LINZESS)  metoprolol succinate (TOPROL-XL)    May take these medicines IF NEEDED: diltiazem (CARDIZEM)  HYDROcodone-acetaminophen (NORCO)    One week prior to surgery, STOP taking any Aleve, Naproxen, Ibuprofen, Motrin, Advil, Goody's, BC's, all herbal medications, fish oil, and non-prescription vitamins.   WHAT DO I DO ABOUT MY DIABETES MEDICATION?   Do not take glipiZIDE (GLUCOTROL) the evening before surgery or the morning of surgery.  Do not take metFORMIN (GLUCOPHAGE) the morning of surgery.  STOP taking your Semaglutide one week prior to surgery. Your last dose will be August 29th   HOW TO MANAGE YOUR DIABETES BEFORE AND AFTER SURGERY  Why is it important to control my blood sugar before and after surgery? Improving blood sugar levels before and after surgery helps healing and can limit problems. A way of improving blood sugar control is eating a healthy diet by:  Eating less sugar and carbohydrates  Increasing activity/exercise  Talking with your doctor about reaching your blood sugar goals High blood sugars (greater than 180 mg/dL) can raise your risk of infections and  slow your recovery, so you will need to focus on controlling your diabetes during the weeks before surgery. Make sure that the doctor who takes care of your diabetes knows about your planned surgery including the date and location.  How do I manage my blood sugar before surgery? Check your blood sugar at least 4 times a day, starting 2 days before surgery, to make sure that the level is not too high or low.  Check your blood sugar the morning of your surgery when you wake up and every 2 hours until you get to the Short Stay unit.  If your blood sugar is less than 70 mg/dL, you will need to treat for low blood sugar: Do not take insulin. Treat a low blood sugar (less than 70 mg/dL) with  cup of clear juice (cranberry or apple), 4 glucose tablets, OR glucose gel. Recheck blood sugar in 15 minutes after treatment (to make sure it is greater than 70 mg/dL). If your blood sugar is not greater than 70 mg/dL on recheck, call [phone] for further instructions. Report your blood sugar to the short stay nurse when you get to Short Stay.  If you are admitted to the hospital after surgery: Your blood sugar will be checked by the staff and you will probably be given insulin after surgery (instead of oral diabetes medicines) to make sure you have good blood sugar levels. The goal for blood sugar control after surgery is 80-180 mg/dL.                      Do NOT Smoke (Tobacco/Vaping) for 24 hours prior  to your procedure.  If you use a CPAP at night, you may bring your mask/headgear for your overnight stay.   You will be asked to remove any contacts, glasses, piercing's, hearing aid's, dentures/partials prior to surgery. Please bring cases for these items if needed.    Patients discharged the day of surgery will not be allowed to drive home, and someone needs to stay with them for 24 hours.  SURGICAL WAITING ROOM VISITATION Patients may have no more than 2 support people in the waiting area -  these visitors may rotate.   Pre-op nurse will coordinate an appropriate time for 1 ADULT support person, who may not rotate, to accompany patient in pre-op.  Children under the age of 3 must have an adult with them who is not the patient and must remain in the main waiting area with an adult.  If the patient needs to stay at the hospital during part of their recovery, the visitor guidelines for inpatient rooms apply.  Please refer to the [facility] website for the visitor guidelines for any additional information.   If you received a COVID test during your pre-op visit  it is requested that you wear a mask when out in public, stay away from anyone that may not be feeling well and notify your surgeon if you develop symptoms. If you have been in contact with anyone that has tested positive in the last 10 days please notify you surgeon.      Pre-operative CHG Bathing Instructions   You can play a key role in reducing the risk of infection after surgery. Your skin needs to be as free of germs as possible. You can reduce the number of germs on your skin by washing with CHG (chlorhexidine gluconate) soap before surgery. CHG is an antiseptic soap that kills germs and continues to kill germs even after washing.   DO NOT use if you have an allergy to chlorhexidine/CHG or antibacterial soaps. If your skin becomes reddened or irritated, stop using the CHG and notify one of our RNs at [phone].              TAKE A SHOWER THE NIGHT BEFORE SURGERY AND THE DAY OF SURGERY    Please keep in mind the following:  DO NOT shave, including legs and underarms, 48 hours prior to surgery.   You may shave your face before/day of surgery.  Place clean sheets on your bed the night before surgery Use a clean washcloth (not used since being washed) for each shower. DO NOT sleep with pet's night before surgery.  CHG Shower Instructions:  If you choose to wash your hair and private area, wash first with your  normal shampoo/soap.  After you use shampoo/soap, rinse your hair and body thoroughly to remove shampoo/soap residue.  Turn the water OFF and apply half the bottle of CHG soap to a CLEAN washcloth.  Apply CHG soap ONLY FROM YOUR NECK DOWN TO YOUR TOES (washing for 3-5 minutes)  DO NOT use CHG soap on face, private areas, open wounds, or sores.  Pay special attention to the area where your surgery is being performed.  If you are having back surgery, having someone wash your back for you may be helpful. Wait 2 minutes after CHG soap is applied, then you may rinse off the CHG soap.  Pat dry with a clean towel  Put on clean pajamas    Additional instructions for the day of surgery: DO NOT APPLY any lotions,  deodorants, cologne, or perfumes.   Do not wear jewelry or makeup Do not wear nail polish, gel polish, artificial nails, or any other type of covering on natural nails (fingers and toes) Do not bring valuables to the hospital. [facility] is not responsible for valuables/personal belongings. Put on clean/comfortable clothes.  Please brush your teeth.  Ask your nurse before applying any prescription medications to the skin.

## 2023-02-04 NOTE — Telephone Encounter (Signed)
Per Antionette Poles, PA-C, since patient was symptomatic with Covid based on urgent care notes, patient will need to delay surgery for 4 weeks and feel recovered 2 weeks prior to surgery.   Spoke with patient and explained the above. She voiced understanding. Rescheduled TCAR surgery for 9/25. Instructions reviewed and patient aware to take last dose of Ozempic on 9/12.

## 2023-02-05 ENCOUNTER — Inpatient Hospital Stay (HOSPITAL_COMMUNITY)
Admission: RE | Admit: 2023-02-05 | Discharge: 2023-02-05 | Disposition: A | Discharge status: Home / Self Care | Payer: Medicare Other | Source: Ambulatory Visit

## 2023-02-09 ENCOUNTER — Ambulatory Visit (INDEPENDENT_AMBULATORY_CARE_PROVIDER_SITE_OTHER): Payer: Medicare Other

## 2023-02-09 ENCOUNTER — Ambulatory Visit: Payer: Medicare Other | Admitting: Family Medicine

## 2023-02-09 DIAGNOSIS — E538 Deficiency of other specified B group vitamins: Secondary | ICD-10-CM

## 2023-02-09 NOTE — Progress Notes (Signed)
Cyanocobalamin injection given to right deltoid.  Patient tolerated well. 

## 2023-02-15 NOTE — Pre-Procedure Instructions (Signed)
Surgical Instructions   Your procedure is scheduled on February 24, 2023. Report to [facility] Main Entrance "A" at 5:30 A.M., then check in with the Admitting office. Any questions or running late day of surgery: call [phone]  Questions prior to your surgery date: call [phone], Monday-Friday, 8am-4pm. If you experience any cold or flu symptoms such as cough, fever, chills, shortness of breath, etc. between now and your scheduled surgery, please notify us at the above number.     Remember:  Do not eat or drink after midnight the night before your surgery     Take these medicines the morning of surgery with A SIP OF WATER: aspirin  clopidogrel (PLAVIX)  diltiazem (CARDIZEM CD)  ezetimibe (ZETIA)  flecainide (TAMBOCOR)  gabapentin (NEURONTIN)  levothyroxine (SYNTHROID)  linaclotide (LINZESS)  lovastatin (MEVACOR)  metoprolol succinate (TOPROL-XL)    May take these medicines IF NEEDED: diltiazem (CARDIZEM)  HYDROcodone-acetaminophen (NORCO)    One week prior to surgery, STOP taking any Aleve, Naproxen, Ibuprofen, Motrin, Advil, Goody's, BC's, all herbal medications, fish oil, and non-prescription vitamins.   WHAT DO I DO ABOUT MY DIABETES MEDICATION?   Do not take glipiZIDE (GLUCOTROL) the evening before surgery or the morning of surgery.  Do not take your metFORMIN (GLUCOPHAGE) morning of surgery.      STOP taking your Semaglutide one week prior to surgery. DO NOT take any doses after September 17th.   HOW TO MANAGE YOUR DIABETES BEFORE AND AFTER SURGERY  Why is it important to control my blood sugar before and after surgery? Improving blood sugar levels before and after surgery helps healing and can limit problems. A way of improving blood sugar control is eating a healthy diet by:  Eating less sugar and carbohydrates  Increasing activity/exercise  Talking with your doctor about reaching your blood sugar goals High blood sugars (greater than 180  mg/dL) can raise your risk of infections and slow your recovery, so you will need to focus on controlling your diabetes during the weeks before surgery. Make sure that the doctor who takes care of your diabetes knows about your planned surgery including the date and location.  How do I manage my blood sugar before surgery? Check your blood sugar at least 4 times a day, starting 2 days before surgery, to make sure that the level is not too high or low.  Check your blood sugar the morning of your surgery when you wake up and every 2 hours until you get to the Short Stay unit.  If your blood sugar is less than 70 mg/dL, you will need to treat for low blood sugar: Do not take insulin. Treat a low blood sugar (less than 70 mg/dL) with  cup of clear juice (cranberry or apple), 4 glucose tablets, OR glucose gel. Recheck blood sugar in 15 minutes after treatment (to make sure it is greater than 70 mg/dL). If your blood sugar is not greater than 70 mg/dL on recheck, call [phone] for further instructions. Report your blood sugar to the short stay nurse when you get to Short Stay.  If you are admitted to the hospital after surgery: Your blood sugar will be checked by the staff and you will probably be given insulin after surgery (instead of oral diabetes medicines) to make sure you have good blood sugar levels. The goal for blood sugar control after surgery is 80-180 mg/dL.  Do NOT Smoke (Tobacco/Vaping) for 24 hours prior to your procedure.  If you use a CPAP at night, you may bring your mask/headgear for your overnight stay.   You will be asked to remove any contacts, glasses, piercing's, hearing aid's, dentures/partials prior to surgery. Please bring cases for these items if needed.    Patients discharged the day of surgery will not be allowed to drive home, and someone needs to stay with them for 24 hours.  SURGICAL WAITING ROOM VISITATION Patients may have no more  than 2 support people in the waiting area - these visitors may rotate.   Pre-op nurse will coordinate an appropriate time for 1 ADULT support person, who may not rotate, to accompany patient in pre-op.  Children under the age of 74 must have an adult with them who is not the patient and must remain in the main waiting area with an adult.  If the patient needs to stay at the hospital during part of their recovery, the visitor guidelines for inpatient rooms apply.  Please refer to the [facility] website for the visitor guidelines for any additional information.   If you received a COVID test during your pre-op visit  it is requested that you wear a mask when out in public, stay away from anyone that may not be feeling well and notify your surgeon if you develop symptoms. If you have been in contact with anyone that has tested positive in the last 10 days please notify you surgeon.      Pre-operative CHG Bathing Instructions   You can play a key role in reducing the risk of infection after surgery. Your skin needs to be as free of germs as possible. You can reduce the number of germs on your skin by washing with CHG (chlorhexidine gluconate) soap before surgery. CHG is an antiseptic soap that kills germs and continues to kill germs even after washing.   DO NOT use if you have an allergy to chlorhexidine/CHG or antibacterial soaps. If your skin becomes reddened or irritated, stop using the CHG and notify one of our RNs at [phone].              TAKE A SHOWER THE NIGHT BEFORE SURGERY AND THE DAY OF SURGERY    Please keep in mind the following:  DO NOT shave, including legs and underarms, 48 hours prior to surgery.   You may shave your face before/day of surgery.  Place clean sheets on your bed the night before surgery Use a clean washcloth (not used since being washed) for each shower. DO NOT sleep with pet's night before surgery.  CHG Shower Instructions:  If you choose to wash your  hair and private area, wash first with your normal shampoo/soap.  After you use shampoo/soap, rinse your hair and body thoroughly to remove shampoo/soap residue.  Turn the water OFF and apply half the bottle of CHG soap to a CLEAN washcloth.  Apply CHG soap ONLY FROM YOUR NECK DOWN TO YOUR TOES (washing for 3-5 minutes)  DO NOT use CHG soap on face, private areas, open wounds, or sores.  Pay special attention to the area where your surgery is being performed.  If you are having back surgery, having someone wash your back for you may be helpful. Wait 2 minutes after CHG soap is applied, then you may rinse off the CHG soap.  Pat dry with a clean towel  Put on clean pajamas    Additional instructions for the  day of surgery: DO NOT APPLY any lotions, deodorants, cologne, or perfumes.   Do not wear jewelry or makeup Do not wear nail polish, gel polish, artificial nails, or any other type of covering on natural nails (fingers and toes) Do not bring valuables to the hospital. [facility] is not responsible for valuables/personal belongings. Put on clean/comfortable clothes.  Please brush your teeth.  Ask your nurse before applying any prescription medications to the skin.

## 2023-02-16 ENCOUNTER — Other Ambulatory Visit: Payer: Self-pay

## 2023-02-16 ENCOUNTER — Encounter (HOSPITAL_COMMUNITY)
Admission: RE | Admit: 2023-02-16 | Discharge: 2023-02-16 | Disposition: A | Discharge status: Home / Self Care | Payer: Medicare Other | Source: Ambulatory Visit | Attending: Surgery | Admitting: Surgery

## 2023-02-16 ENCOUNTER — Encounter (HOSPITAL_COMMUNITY): Payer: Self-pay

## 2023-02-16 VITALS — BP 130/65 | HR 52 | Temp 98.2°F | Resp 18 | Ht 62.0 in | Wt 219.8 lb

## 2023-02-16 DIAGNOSIS — I6523 Occlusion and stenosis of bilateral carotid arteries: Secondary | ICD-10-CM | POA: Diagnosis not present

## 2023-02-16 DIAGNOSIS — Z01818 Encounter for other preprocedural examination: Secondary | ICD-10-CM | POA: Diagnosis not present

## 2023-02-16 HISTORY — DX: Other complications of anesthesia, initial encounter: T88.59XA

## 2023-02-16 HISTORY — DX: Respiratory tuberculosis unspecified: A15.9

## 2023-02-16 HISTORY — DX: Peripheral vascular disease, unspecified: I73.9

## 2023-02-16 LAB — COMPREHENSIVE METABOLIC PANEL
ALT: 19 U/L (ref 0–44)
AST: 19 U/L (ref 15–41)
Albumin: 4 g/dL (ref 3.5–5.0)
Alkaline Phosphatase: 84 U/L (ref 38–126)
Anion gap: 9 (ref 5–15)
BUN: 21 mg/dL (ref 8–23)
CO2: 27 mmol/L (ref 22–32)
Calcium: 9 mg/dL (ref 8.9–10.3)
Chloride: 101 mmol/L (ref 98–111)
Creatinine, Ser: 0.78 mg/dL (ref 0.44–1.00)
GFR, Estimated: >60 mL/min (ref >60)
Glucose, Bld: 152 mg/dL — ABNORMAL HIGH (ref 70–99)
Potassium: 4.2 mmol/L (ref 3.5–5.1)
Sodium: 137 mmol/L (ref 135–145)
Total Bilirubin: 0.9 mg/dL (ref 0.3–1.2)
Total Protein: 7.2 g/dL (ref 6.5–8.1)

## 2023-02-16 LAB — URINALYSIS, ROUTINE W REFLEX MICROSCOPIC
Bilirubin Urine: NEGATIVE
Glucose, UA: NEGATIVE mg/dL
Hgb urine dipstick: NEGATIVE
Ketones, ur: NEGATIVE mg/dL
Nitrite: POSITIVE — AB
Protein, ur: NEGATIVE mg/dL
Specific Gravity, Urine: 1.017 (ref 1.005–1.030)
pH: 5 (ref 5.0–8.0)

## 2023-02-16 LAB — HEMOGLOBIN A1C
Hgb A1c MFr Bld: 5.1 % (ref 4.8–5.6)
Mean Plasma Glucose: 99.67 mg/dL

## 2023-02-16 LAB — CBC
HCT: 32.3 % — ABNORMAL LOW (ref 36.0–46.0)
Hemoglobin: 10.1 g/dL — ABNORMAL LOW (ref 12.0–15.0)
MCH: 29.4 pg (ref 26.0–34.0)
MCHC: 31.3 g/dL (ref 30.0–36.0)
MCV: 94.2 fL (ref 80.0–100.0)
Platelets: 303 10*3/uL (ref 150–400)
RBC: 3.43 MIL/uL — ABNORMAL LOW (ref 3.87–5.11)
RDW: 17.5 % — ABNORMAL HIGH (ref 11.5–15.5)
WBC: 11.5 10*3/uL — ABNORMAL HIGH (ref 4.0–10.5)
nRBC: 0 % (ref 0.0–0.2)

## 2023-02-16 LAB — SURGICAL PCR SCREEN
MRSA, PCR: NEGATIVE
Staphylococcus aureus: NEGATIVE

## 2023-02-16 LAB — TYPE AND SCREEN
ABO/RH(D): A NEG
Antibody Screen: NEGATIVE

## 2023-02-16 LAB — APTT: aPTT: 28 s (ref 24–36)

## 2023-02-16 LAB — GLUCOSE, CAPILLARY: Glucose-Capillary: 111 mg/dL — ABNORMAL HIGH (ref 70–99)

## 2023-02-16 NOTE — Progress Notes (Addendum)
PCP - [facility]  Cardiologist - Dr. Elita Boone  EP- Dr. Kellie Moor  Endocrine-no  Pulm-no  Chest x-ray - NA  EKG - 10/20/22  Stress Test - 09/17/20  ECHO - 09/08/22  Cardiac Cath - no  Stress test-09/17/20  AICD-no PM-no LOOP-no  Nerve Stimulator-no  Dialysis-no  Sleep Study - yes CPAP - yes  LABS- ABC, CMP, PT, PTT, T/S, A1C, UA micro  ASA- yes - to continue Plavix- to continue  ERAS-no  HA1C- patient reports 6.1-, 02/16/23- A1C - 5.7 GLP-1-yes, last dose was- 02/04/23 Fasting Blood Sugar - rarely checks Checks Blood Sugar __rare__ times a day   Patient did not bring medication list, I reviewed medications by name, I gave her the number to call pharmacy  with list.  Anesthesia- Miss Hutherson was positive for Covid 01/25/23- symptoms lasted for 1 week, she no longer has symptoms of any respiratory symptoms. Dr. Myra Gianotti added Plavix to Aspirin and Lipid to last office visit. I left a message for [facility] nurse line, I asked that they review patients UA with micro- few bacteria, moderate Leukocytes, Hyline Cast present. Pt denies having chest pain, sob, or fever at this time. All instructions explained to the pt, with a verbal understanding of the material. Pt agrees to go over the instructions while at home for a better understanding. The opportunity to ask questions was provided.^

## 2023-02-17 ENCOUNTER — Encounter (HOSPITAL_COMMUNITY): Payer: Self-pay | Admitting: Physician Assistant

## 2023-02-17 ENCOUNTER — Telehealth: Payer: Self-pay | Admitting: *Deleted

## 2023-02-17 ENCOUNTER — Telehealth: Payer: Self-pay

## 2023-02-17 MED ORDER — SULFAMETHOXAZOLE-TRIMETHOPRIM 800-160 MG PO TABS: 1.0000 | ORAL_TABLET | Freq: Two times a day (BID) | ORAL | 0 refills | Status: AC

## 2023-02-17 NOTE — Telephone Encounter (Signed)
Spoke with pt, echo rescheduled to Monday 02/22/23 at [facility] [place]. Directions and instructions discussed with the patient.

## 2023-02-17 NOTE — Telephone Encounter (Signed)
Received notification from preadmission testing regarding patient's abnormal pre-op urinalysis results. Spoke with patient regarding urinalysis results and will send a Rx for Bactrim DS, take 1 tablet by mouth twice daily x 5 days to pharmacy. Will make Dr. Myra Gianotti aware and will contact patient if surgery schedule needs to change from 9/25. Patient verbalized understanding.

## 2023-02-17 NOTE — Telephone Encounter (Signed)
-----   Message from Jasmine December sent at 02/17/2023  4:26 PM EDT ----- Regarding: Updated echo Copying prior secure chat conversation here for permanence:  Antionette Poles, PA-C Dr. Antoine Poche, I work in the preop clinic at [facility] and am reviewing the attached pt's chart for upcoming TCAR on 9/25 with Dr. Myra Gianotti. She does have clearance per telephone encounter by Bernadene Person on 02/02/23. However, I also see that she has an echo scheduled on 10/8 for followup of her severe AS. I wanted to see if that was something you felt needed to be done prior to her TCAR.  Rollene Rotunda, MD Yes. Let me see if we can move it up. Please send a staff message and include me a [facility] so this message becomes permanent.            Zannie Cove [facility] Short Stay Center/Anesthesiology Phone [phone] 02/17/2023 4:27 PM

## 2023-02-17 NOTE — Progress Notes (Signed)
Anesthesia Chart Review:  Follows with cardiology for hx of paroxysmal atrial fibrillation s/p Watchman, carotid artery stenosis, moderate aortic stenosis, LVH, hypertension. Seen by Bernadene Person, NP on 02/02/23 for preop eval. Per note, "According to the Revised Cardiac Risk Index (RCRI), her Perioperative Risk of Major Cardiac Event is (%): 0.4. Her Functional Capacity in METs is: 4.06 according to the Duke Activity Status Index (DASI). Therefore, based on ACC/AHA guidelines, patient would be at acceptable risk for the planned procedure without further cardiovascular testing."  Patient was scheduled for follow-up echocardiogram in October 2024 for monitoring of mod-severe aortic stenosis.  I reached out to Dr. Antoine Poche and he recommended having this repeated prior to surgery.  Echo 02/22/2023 showed EF 70 to 75%, grade 2 DD, moderate aortic stenosis with mean gradient 24.2 mmHg and AVA 1.22 cm.  Prior mean gradient 39 mmHg on echo 08/2022.  Follows with vascular surgery for hx of carotid disease s/p left CEA 2017 for asymptomatic stenosis. Most recent duplex showed >80% stenosis on the right.   OSA on CPAP.   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 Last dose of Semaglutide 02/04/23.  History of difficult airway, glidescope previously used electively.   Preop labs reviewed, mild anemia with Hgb 10.1, otherwise unremarkable.   EKG 10/20/22: Sinus rhythm with first degree AV block. Rate 63. RBBB. LAFB. Voltage criteria for LVH.  TTE 02/22/2023: 1. Left ventricular ejection fraction, by estimation, is 70 to 75%. The  left ventricle has hyperdynamic function. The left ventricle has no  regional wall motion abnormalities. There is mild left ventricular  hypertrophy. Left ventricular diastolic  parameters are consistent with Grade II diastolic dysfunction  (pseudonormalization).   2. Right ventricular systolic function is normal. The right ventricular  size is normal.   3. Left atrial size was moderately  dilated.   4. The mitral valve is normal in structure. Mild mitral valve  regurgitation. Mild mitral stenosis. The mean mitral valve gradient is 3.0  mmHg. Severe mitral annular calcification.   5. The aortic valve is normal in structure. Aortic valve regurgitation is  not visualized. Moderate aortic valve stenosis. Aortic valve area, by VTI  measures 1.22 cm. Aortic valve mean gradient measures 24.2 mmHg. Aortic  valve Vmax measures 3.26 m/s.   6. The inferior vena cava is dilated in size with >50% respiratory  variability, suggesting right atrial pressure of 8 mmHg.   Comparison(s): Prior aortic valve mean gradient 39 mmHg.   Carotid duplex 01/11/23: Summary:  Right Carotid: Velocities in the right ICA are consistent with a 80-99%  stenosis. Calcific plaque may obscure higher velocity. The ECA appears >50% stenosed.  Left Carotid: Velocities in the left ICA are consistent with a 1-39% stenosis.  Vertebrals: Right vertebral artery demonstrates antegrade flow. Atypical low velocity flow in the left vertebral artery.  Subclavians: Normal flow hemodynamics were seen in bilateral subclavian arteries.   Nuclear stress 09/17/20: There was no ST segment deviation noted during stress. Findings consistent with prior distal anteior/apical myocardial infarction with mild peri-infarct ischemia. The left ventricular ejection fraction is normal (55-65%). Low risk based on perfusion imaging. Elevated TID 1.41 may suggest higher relative risk.    Zannie Cove [facility] Short Stay Center/Anesthesiology Phone [phone] 02/17/2023 3:47 PM

## 2023-02-21 ENCOUNTER — Other Ambulatory Visit: Payer: Self-pay | Admitting: Family Medicine

## 2023-02-22 ENCOUNTER — Ambulatory Visit (HOSPITAL_COMMUNITY)
Admission: RE | Admit: 2023-02-22 | Discharge: 2023-02-22 | Disposition: A | Discharge status: Home / Self Care | Payer: Medicare Other | Source: Ambulatory Visit | Attending: Cardiology | Admitting: Cardiology

## 2023-02-22 ENCOUNTER — Telehealth: Payer: Self-pay | Admitting: Cardiology

## 2023-02-22 DIAGNOSIS — I080 Rheumatic disorders of both mitral and aortic valves: Secondary | ICD-10-CM | POA: Insufficient documentation

## 2023-02-22 DIAGNOSIS — I779 Disorder of arteries and arterioles, unspecified: Secondary | ICD-10-CM | POA: Diagnosis not present

## 2023-02-22 DIAGNOSIS — I739 Peripheral vascular disease, unspecified: Secondary | ICD-10-CM | POA: Insufficient documentation

## 2023-02-22 DIAGNOSIS — I480 Paroxysmal atrial fibrillation: Secondary | ICD-10-CM | POA: Insufficient documentation

## 2023-02-22 DIAGNOSIS — I10 Essential (primary) hypertension: Secondary | ICD-10-CM | POA: Insufficient documentation

## 2023-02-22 DIAGNOSIS — E119 Type 2 diabetes mellitus without complications: Secondary | ICD-10-CM | POA: Diagnosis not present

## 2023-02-22 DIAGNOSIS — I350 Nonrheumatic aortic (valve) stenosis: Secondary | ICD-10-CM | POA: Diagnosis not present

## 2023-02-22 LAB — ECHOCARDIOGRAM COMPLETE
AR max vel: 1.11 cm2
AV Area VTI: 1.22 cm2
AV Area mean vel: 1.07 cm2
AV Mean grad: 24.2 mmHg
AV Peak grad: 42.5 mmHg
Ao pk vel: 3.26 m/s
Area-P 1/2: 3.6 cm2
MV VTI: 1.79 cm2
S' Lateral: 3.4 cm

## 2023-02-22 NOTE — Telephone Encounter (Signed)
Patient is following up from disconnected call with LPN this morning. She reiterates that yesterday she was very exhausted and tired and she checked her HR and it was in the upper 40's--around 47. She states today she was also very weak and HR was 34 this morning. Since then her HR has gone up to the 60's and she would like an additional call to further discuss.

## 2023-02-22 NOTE — Telephone Encounter (Signed)
Pt c/o Shortness Of Breath: STAT if SOB developed within the last 24 hours or pt is noticeably SOB on the phone  1. Are you currently SOB (can you hear that pt is SOB on the phone)? Yes, cannot hear over the phone   2. How long have you been experiencing SOB? Unsure due to back injury causing her not to be able to walk as much to know.  3. Are you SOB when sitting or when up moving around? Moving around   4. Are you currently experiencing any other symptoms? Weak legs & HR dropped to 40'S yesterday    HR checked at time of call and is HR 34.  Call transferred to triage due to HR being STAT.

## 2023-02-22 NOTE — Anesthesia Preprocedure Evaluation (Signed)
Anesthesia Evaluation    Airway        Dental   Pulmonary           Cardiovascular hypertension,      Neuro/Psych    GI/Hepatic   Endo/Other  diabetes    Renal/GU      Musculoskeletal   Abdominal   Peds  Hematology   Anesthesia Other Findings   Reproductive/Obstetrics                              Anesthesia Physical Anesthesia Plan  ASA:   Anesthesia Plan:    Post-op Pain Management:    Induction:   PONV Risk Score and Plan:   Airway Management Planned:   Additional Equipment:   Intra-op Plan:   Post-operative Plan:   Informed Consent:   Plan Discussed with:   Anesthesia Plan Comments: (PAT note by Antionette Poles, PA-C:  Follows with cardiology for hx of paroxysmal atrial fibrillation s/p Watchman, carotid artery stenosis, moderate aortic stenosis, LVH, hypertension. Seen by Bernadene Person, NP on 02/02/23 for preop eval. Per note, "According to the Revised Cardiac Risk Index (RCRI), her Perioperative Risk of Major Cardiac Event is (%): 0.4. Her Functional Capacity in METs is: 4.06 according to the Duke Activity Status Index (DASI). Therefore, based on ACC/AHA guidelines, patient would be at acceptable risk for the planned procedure without further cardiovascular testing."  Patient was scheduled for follow-up echocardiogram in October 2024 for monitoring of mod-severe aortic stenosis.  I reached out to Dr. Antoine Poche and he recommended having this repeated prior to surgery.  Echo 02/22/2023 showed EF 70 to 75%, grade 2 DD, moderate aortic stenosis with mean gradient 24.2 mmHg and AVA 1.22 cm.  Prior mean gradient 39 mmHg on echo 08/2022.  Follows with vascular surgery for hx of carotid disease s/p left CEA 2017 for asymptomatic stenosis. Most recent duplex showed >80% stenosis on the right.   OSA on CPAP.   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 Last dose of Semaglutide 02/04/23.  History of  difficult airway, glidescope previously used electively.   Preop labs reviewed, mild anemia with Hgb 10.1, otherwise unremarkable.   EKG 10/20/22: Sinus rhythm with first degree AV block. Rate 63. RBBB. LAFB. Voltage criteria for LVH.  TTE 02/22/2023: 1. Left ventricular ejection fraction, by estimation, is 70 to 75%. The  left ventricle has hyperdynamic function. The left ventricle has no  regional wall motion abnormalities. There is mild left ventricular  hypertrophy. Left ventricular diastolic  parameters are consistent with Grade II diastolic dysfunction  (pseudonormalization).   2. Right ventricular systolic function is normal. The right ventricular  size is normal.   3. Left atrial size was moderately dilated.   4. The mitral valve is normal in structure. Mild mitral valve  regurgitation. Mild mitral stenosis. The mean mitral valve gradient is 3.0  mmHg. Severe mitral annular calcification.   5. The aortic valve is normal in structure. Aortic valve regurgitation is  not visualized. Moderate aortic valve stenosis. Aortic valve area, by VTI  measures 1.22 cm. Aortic valve mean gradient measures 24.2 mmHg. Aortic  valve Vmax measures 3.26 m/s.   6. The inferior vena cava is dilated in size with >50% respiratory  variability, suggesting right atrial pressure of 8 mmHg.   Comparison(s): Prior aortic valve mean gradient 39 mmHg.   Carotid duplex 01/11/23: Summary:  Right Carotid: Velocities in the right ICA are consistent with a 80-99%  stenosis. Calcific plaque may obscure higher velocity. The ECA appears >50% stenosed.  Left Carotid: Velocities in the left ICA are consistent with a 1-39% stenosis.  Vertebrals: Right vertebral artery demonstrates antegrade flow. Atypical low velocity flow in the left vertebral artery.  Subclavians: Normal flow hemodynamics were seen in bilateral subclavian arteries.   Nuclear stress 09/17/20:  There was no ST segment deviation noted during  stress.  Findings consistent with prior distal anteior/apical myocardial infarction with mild peri-infarct ischemia.  The left ventricular ejection fraction is normal (55-65%).  Low risk based on perfusion imaging. Elevated TID 1.41 may suggest higher relative risk.  )         Anesthesia Quick Evaluation

## 2023-02-22 NOTE — Progress Notes (Signed)
*  PRELIMINARY RESULTS* Echocardiogram 2D Echocardiogram has been performed.  Emily [name] 02/22/2023, 11:47 AM

## 2023-02-22 NOTE — Telephone Encounter (Signed)
Patient states she has been very weak and has been falling down. She states her kegs are really weak. Yesterday her heart rate was in the 40's and she felt weak. She stated it gradually got better.  Today she states while getting ready for her Echo she got really weak and she had to lie down. Her heart rate was at a 34. She went and had echo today. She does feel better and heart rate currently in the 60's and she is working. No other vitals to report. She states the weak spells and her heart rate dropping that low is really concerning to her. Currently asymptomatic. Did advise to continue to monitor heart rate. Discussed ED precautions. Will forward to provider. She has appointment with PCP tomorrow.

## 2023-02-22 NOTE — Telephone Encounter (Signed)
Phone call was transferred and disconnected.  Called back and left voicemail for patient to return call to office.

## 2023-02-23 ENCOUNTER — Telehealth: Payer: Self-pay | Admitting: Surgery

## 2023-02-23 ENCOUNTER — Encounter: Payer: Self-pay | Admitting: Nurse Practitioner

## 2023-02-23 ENCOUNTER — Ambulatory Visit (HOSPITAL_COMMUNITY)
Admission: RE | Admit: 2023-02-23 | Discharge: 2023-02-23 | Disposition: A | Discharge status: Home / Self Care | Payer: Medicare Other | Source: Ambulatory Visit | Attending: Nurse Practitioner | Admitting: Nurse Practitioner

## 2023-02-23 ENCOUNTER — Telehealth: Payer: Self-pay | Admitting: Family Medicine

## 2023-02-23 ENCOUNTER — Telehealth: Payer: Self-pay

## 2023-02-23 ENCOUNTER — Ambulatory Visit (INDEPENDENT_AMBULATORY_CARE_PROVIDER_SITE_OTHER): Payer: Medicare Other | Admitting: Nurse Practitioner

## 2023-02-23 VITALS — BP 125/54 | HR 58 | Temp 96.8°F | Resp 20 | Ht 62.0 in | Wt 217.0 lb

## 2023-02-23 DIAGNOSIS — L03115 Cellulitis of right lower limb: Secondary | ICD-10-CM | POA: Diagnosis not present

## 2023-02-23 MED ORDER — FUROSEMIDE 20 MG PO TABS: 20.0000 mg | ORAL_TABLET | Freq: Every day | ORAL | 0 refills | Status: DC | PRN

## 2023-02-23 MED ORDER — CEPHALEXIN 500 MG PO CAPS: 500.0000 mg | ORAL_CAPSULE | Freq: Three times a day (TID) | ORAL | 0 refills | Status: DC

## 2023-02-23 NOTE — Patient Instructions (Signed)

## 2023-02-23 NOTE — Progress Notes (Signed)
Subjective:    Patient ID: Emily [name], female    DOB: 03-16-1951, 72 y.o.   MRN: [id number]   Chief Complaint: Right lower leg red   Patient in c/o pain and redness right lower leg. Mild edema     Patient Active Problem List   Diagnosis Date Noted   Nonrheumatic aortic valve stenosis 03/19/2020   Persistent atrial fibrillation (HCC) 01/16/2020   OSA on CPAP 09/25/2019   History of adenomatous polyp of colon 08/30/2019   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 device 07/16/2018   Chronic anticoagulation 03/03/2018   Restless leg syndrome 02/16/2018   Chronic left-sided low back pain with left-sided sciatica 02/16/2018   Morbid obesity (HCC) 02/14/2018   Bruit 02/14/2018   DDD (degenerative disc disease), lumbar 12/07/2017   Peripheral arterial disease (HCC) 07/06/2017   Type 2 diabetes mellitus with hyperlipidemia (HCC) 06/29/2017   Iron deficiency anemia 05/27/2017   Vitamin B12 deficiency 05/27/2017   Pain in joint, ankle and foot 06/03/2016   Bilateral carotid artery disease (HCC) 07/05/2015   Multiple gastric polyps 07/03/2014   Hypertension associated with diabetes (HCC) 10/27/2013   Hypothyroidism due to acquired atrophy of thyroid 10/27/2013   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  Eyes:  Negative for pain.  Respiratory:  Negative for shortness of breath.   Cardiovascular:  Negative for chest pain, palpitations and leg swelling.  Gastrointestinal:  Negative for abdominal pain.  Endocrine: Negative for polydipsia.  Skin:  Negative for rash.  Neurological:  Negative for dizziness, weakness and headaches.  Hematological:  Does not bruise/bleed easily.  All other systems reviewed and are negative.      Objective:   Physical Exam Constitutional:      Appearance: Normal appearance.  Cardiovascular:     Rate and Rhythm: Normal rate and regular rhythm.     Heart  sounds: Normal heart sounds.  Pulmonary:     Effort: Pulmonary effort is normal.     Breath sounds: Normal breath sounds.  Musculoskeletal:     Right lower leg: Edema (2+) present.     Left lower leg: No edema.  Skin:    General: Skin is warm.  Neurological:     General: No focal deficit present.     Mental Status: She is alert and oriented to person, place, and time.  Psychiatric:        Mood and Affect: Mood normal.        Behavior: Behavior normal.    BP (!) 125/54   Pulse (!) 58   Temp (!) 96.8 F (36 C) (Temporal)   Resp 20   Ht 5\' 2"  (1.575 m)   Wt 217 lb (98.4 kg)   SpO2 98%   BMI 39.69 kg/m         Assessment & Plan:   Emily [name] in today with chief complaint of Right lower leg red   1. Cellulitis of right lower extremity Stat doppler study Elevate leg when sitting LasiX daily - US Venous Img Lower Unilateral Right; Future    The above assessment and management plan was discussed with the patient. The patient verbalized understanding of and has agreed to the management plan. Patient is aware to call the clinic if symptoms persist or worsen. Patient is aware when to return to the clinic for a follow-up visit. Patient educated on when it is appropriate to go to the emergency department.  Mary-Margaret Daphine Deutscher, FNP

## 2023-02-23 NOTE — Telephone Encounter (Signed)
Pt called to let us know her HR dropped to 34 yesterday afternoon and she felt very weak. It sounded like she felt it lasted less than a minute and then went back up to the 60's. She said she called her cardiologist and let them know as well. I have sent her MD a message to make him aware.

## 2023-02-23 NOTE — Telephone Encounter (Signed)
I spoke with the patient tonight.  She is having low energy, and also bradycardia. She had a echo done which was ok, but she also has leg swelling and redness.  Doppler was negative for DVT.  We discussed being seen by cardiology and then rescheduling her surgery when she is feeling better, hopefully within a week or 2  Wells Kesha Hurrell

## 2023-02-23 NOTE — Telephone Encounter (Signed)
Called and spoke with patient and gave [place] results. Patient is wanting to know if she will have surgery tomorrow. Patient states that she has already contacted the surgeons office and left a message. I advised to contact their office back and see if they have someone on call. Patient verbalized understanding

## 2023-02-24 ENCOUNTER — Inpatient Hospital Stay (HOSPITAL_COMMUNITY): Admission: RE | Admit: 2023-02-24 | Payer: Medicare Other | Source: Home / Self Care | Admitting: Surgery

## 2023-02-24 ENCOUNTER — Other Ambulatory Visit: Payer: Self-pay | Admitting: Cardiology

## 2023-02-24 ENCOUNTER — Other Ambulatory Visit: Payer: Self-pay | Admitting: Family Medicine

## 2023-02-24 ENCOUNTER — Encounter (HOSPITAL_COMMUNITY): Admission: RE | Payer: Self-pay | Source: Home / Self Care

## 2023-02-24 DIAGNOSIS — Z01818 Encounter for other preprocedural examination: Secondary | ICD-10-CM

## 2023-02-24 SURGERY — TRANSCAROTID ARTERY REVASCULARIZATION (TCAR)
Anesthesia: General | Laterality: Right

## 2023-02-24 NOTE — Progress Notes (Unsigned)
Cardiology Office Note:   Date:  02/25/2023  ID:  Emily [name], DOB [birth year], MRN [id number] PCP: Raliegh Ip, DO  [facility] Providers Cardiologist:  Rollene Rotunda, MD Electrophysiologist:  Will Jorja Loa, MD {  History of Present Illness:   Emily [name] is a 72 y.o. female The patient has paroxysmal atrial fibrillation and was treated with flecainide. She has had recurrent GI bleeding and could not tolerate anticoagulation. She has had transfusions. She subsequently had double-balloon enteroscopy. 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 She did have Watchman.     She was to get CEA.  However, she called recently and reported fatigue, bradycardia and leg swelling with redness.  She had venous Doppler which was negative for DVT.   She also had a follow up echo on 02/22/23 and had a normal EF with moderate aortic stenosis and a gradient of 24.2 mean.   She presents now and has been treated for possible cellulitis in her leg is getting better.  There is a small wound in the anterior pretibial area and some swelling in the calf with slight erythema but no warmth.  She says all of this is improving.  She does not remember having any trauma to that area and otherwise had been doing okay.  She did also report 1 day that her heart rate was in the 40s.  She thinks that he went down to the 30s but very quickly came up to the 50s to 60s.  She felt a little bit lightheaded with this.  This was last week when she was not feeling well.  She was very tired.  She had had some trauma with her cat having to be put to sleep and she had a family reunion.  She felt poorly during this time and this was when her heart rate was low.  However, before then after that she has not had the symptoms.  She has not had any presyncope or syncope.  She has not been having any chest pressure, neck or arm discomfort.  She has been having no new shortness of breath, PND or  orthopnea.  ROS: Positive for back pain.  Studies Reviewed:    EKG:   EKG Interpretation Date/Time:  Thursday February 25 2023 15:15:05 EDT Ventricular Rate:  64 PR Interval:  200 QRS Duration:  174 QT Interval:  486 QTC Calculation: 501 R Axis:   -68  Text Interpretation: Normal sinus rhythm Right bundle branch block Left anterior fascicular block Bifascicular block Left ventricular hypertrophy with repolarization abnormality ( R in aVL , Romhilt-Estes ) Cannot rule out Septal infarct , age undetermined When compared with ECG of 30-Aug-2020 04:01, No significant change since last tracing Confirmed by Rollene Rotunda ([id number]) on 02/25/2023 3:41:38 PM     Risk Assessment/Calculations:    CHA2DS2-VASc Score = 4   This indicates a 4.8% annual risk of stroke. The patient's score is based upon: CHF History: 0 HTN History: 1 Diabetes History: 1 Stroke History: 0 Vascular Disease History: 0 Age Score: 1 Gender Score: 1   Physical Exam:   VS:  BP (!) 142/52   Pulse 64   Ht 5' 2.5" (1.588 m)   Wt 216 lb (98 kg)   SpO2 98%   BMI 38.88 kg/m    Wt Readings from Last 3 Encounters:  02/25/23 216 lb (98 kg)  02/23/23 217 lb (98.4 kg)  02/16/23 219 lb 12.8 oz (99.7 kg)  GEN: Well nourished, well developed in no acute distress NECK: No JVD; No carotid bruits CARDIAC:  RRR, 3 out of 6 systolic murmur radiating slightly at aortic outflow tract, no diastolic murmurs, rubs, gallops RESPIRATORY:  Clear to auscultation without rales, wheezing or rhonchi  ABDOMEN: Soft, non-tender, non-distended EXTREMITIES:  Right greater than left leg edema; No deformity.  Healing wound in the right anterior pretibial region, erythema without rubor in the right lower calf  ASSESSMENT AND PLAN:   Carotid stenosis: She is due to have carotid endarterectomy.  The patient has no high risk symptoms or findings currently.  She is at acceptable risk for the planned surgery.  No further testing is  suggested.    Bradycardia: The patient does have conduction disturbance as above.  She has not any frank syncope.  She has now had some bradycardia.  I am going to have her wear a 3-day monitor to further assess this.  She knows to let me know if she has any presyncope or syncope or symptomatic bradycardia arrhythmias going forward.  AS: This was moderate on echocardiogram.  No further testing is suggested.  PAF: She has not felt any fibrillation.  She is now status post Watchman.  She has had no symptomatic paroxysms.  No change in therapy.  Preop:  See above  Leg swelling:   The patient had no evidence of a DVT.  She does have a slight wound in the anterior pretibial area and likely has a cellulitis that is improving.  She did get antibiotics.  At this point no further cardiovascular testing is suggested.       Follow up with me in 6 months in [place].  Signed, Rollene Rotunda, MD

## 2023-02-25 ENCOUNTER — Ambulatory Visit: Payer: Medicare Other | Attending: Cardiology | Admitting: Cardiology

## 2023-02-25 ENCOUNTER — Ambulatory Visit (INDEPENDENT_AMBULATORY_CARE_PROVIDER_SITE_OTHER): Payer: Medicare Other

## 2023-02-25 ENCOUNTER — Encounter: Payer: Self-pay | Admitting: Cardiology

## 2023-02-25 VITALS — BP 142/52 | HR 64 | Ht 62.5 in | Wt 216.0 lb

## 2023-02-25 DIAGNOSIS — I480 Paroxysmal atrial fibrillation: Secondary | ICD-10-CM

## 2023-02-25 DIAGNOSIS — I350 Nonrheumatic aortic (valve) stenosis: Secondary | ICD-10-CM | POA: Diagnosis not present

## 2023-02-25 DIAGNOSIS — I6523 Occlusion and stenosis of bilateral carotid arteries: Secondary | ICD-10-CM | POA: Diagnosis not present

## 2023-02-25 DIAGNOSIS — R42 Dizziness and giddiness: Secondary | ICD-10-CM

## 2023-02-25 DIAGNOSIS — R001 Bradycardia, unspecified: Secondary | ICD-10-CM

## 2023-02-25 NOTE — Patient Instructions (Signed)
Medication Instructions:  NO CHANGES  *If you need a refill on your cardiac medications before your next appointment, please call your pharmacy*   Testing/Procedures: ZIO XT- Long Term Monitor Instructions  Your physician has requested you wear a ZIO patch monitor for THREE days.  This is a single patch monitor. [employer] supplies one patch monitor per enrollment. Additional stickers are not available. Please do not apply patch if you will be having a Nuclear Stress Test,  Echocardiogram, Cardiac CT, MRI, or Chest Xray during the period you would be wearing the  monitor. The patch cannot be worn during these tests. You cannot remove and re-apply the  ZIO XT patch monitor.  Your ZIO patch monitor will be mailed 3 day USPS to your address on file. It may take 3-5 days  to receive your monitor after you have been enrolled.  Once you have received your monitor, please review the enclosed instructions. Your monitor  has already been registered assigning a specific monitor serial # to you.  Billing and Patient Assistance Program Information  We have supplied [employer] with any of your insurance information on file for billing purposes. [employer] offers a sliding scale Patient Assistance Program for patients that do not have  insurance, or whose insurance does not completely cover the cost of the ZIO monitor.  You must apply for the Patient Assistance Program to qualify for this discounted rate.  To apply, please call [employer] at [phone], select option 4, select option 2, ask to apply for  Patient Assistance Program. Meredeth Ide will ask your household income, and how many people  are in your household. They will quote your out-of-pocket cost based on that information.  [employer] will also be able to set up a 41-month, interest-free payment plan if needed.  Applying the monitor   Shave hair from upper left chest.  Hold abrader disc by orange tab. Rub abrader in 40 strokes over the upper left chest  as  indicated in your monitor instructions.  Clean area with 4 enclosed alcohol pads. Let dry.  Apply patch as indicated in monitor instructions. Patch will be placed under collarbone on left  side of chest with arrow pointing upward.  Rub patch adhesive wings for 2 minutes. Remove white label marked "1". Remove the white  label marked "2". Rub patch adhesive wings for 2 additional minutes.  While looking in a mirror, press and release button in center of patch. A small green light will  flash 3-4 times. This will be your only indicator that the monitor has been turned on.  Do not shower for the first 24 hours. You may shower after the first 24 hours.  Press the button if you feel a symptom. You will hear a small click. Record Date, Time and  Symptom in the Patient Logbook.  When you are ready to remove the patch, follow instructions on the last 2 pages of Patient  Logbook. Stick patch monitor onto the last page of Patient Logbook.  Place Patient Logbook in the blue and white box. Use locking tab on box and tape box closed  securely. The blue and white box has prepaid postage on it. Please place it in the mailbox as  soon as possible. Your physician should have your test results approximately 7 days after the  monitor has been mailed back to [facility].  Call [facility] Customer Care at [phone] if you have questions regarding  your ZIO XT patch monitor. Call them immediately if you see an orange  light blinking on your  monitor.  If your monitor falls off in less than 4 days, contact our Monitor department at [phone].  If your monitor becomes loose or falls off after 4 days call [employer] at [phone] for  suggestions on securing your monitor    Follow-Up: At [facility], you and your health needs are our priority.  As part of our continuing mission to provide you with exceptional heart care, we have created designated Provider Care Teams.  These Care Teams  include your primary Cardiologist (physician) and Advanced Practice Providers (APPs -  Physician Assistants and Nurse Practitioners) who all work together to provide you with the care you need, when you need it.  We recommend signing up for the patient portal called "MyChart".  Sign up information is provided on this After Visit Summary.  MyChart is used to connect with patients for Virtual Visits (Telemedicine).  Patients are able to view lab/test results, encounter notes, upcoming appointments, etc.  Non-urgent messages can be sent to your provider as well.   To learn more about what you can do with MyChart, go to [email or web address].    Your next appointment:   2-3 months with Dr. Antoine Poche in [place]

## 2023-02-25 NOTE — Progress Notes (Unsigned)
Enrolled for [employer] to mail a ZIO XT long term holter monitor to the patients address on file.  

## 2023-02-28 DIAGNOSIS — R001 Bradycardia, unspecified: Secondary | ICD-10-CM | POA: Diagnosis not present

## 2023-02-28 DIAGNOSIS — R42 Dizziness and giddiness: Secondary | ICD-10-CM | POA: Diagnosis not present

## 2023-03-01 ENCOUNTER — Other Ambulatory Visit: Payer: Self-pay

## 2023-03-01 DIAGNOSIS — I6523 Occlusion and stenosis of bilateral carotid arteries: Secondary | ICD-10-CM

## 2023-03-01 NOTE — Progress Notes (Unsigned)
Subjective: CC:DM PCP: Raliegh Ip, DO WGN:FAOZH Emily [name] is a 72 y.o. female presenting to clinic today for:  1. Type 2 Diabetes with hypertension, hyperlipidemia:  Reports compliance with all medications.  She reports no chest pain, shortness of breath, nausea, vomiting or GI upset with semaglutide.  No reports of hypoglycemic episodes.  Diabetes Health Maintenance Due  Topic Date Due   FOOT EXAM  02/04/2023   HEMOGLOBIN A1C  08/16/2023   OPHTHALMOLOGY EXAM  09/29/2023    Last A1c:  Lab Results  Component Value Date   HGBA1C 5.1 02/16/2023    Denies any dysuria, hematuria or increased urinary frequency but notes that she was diagnosed with a UTI during preop and this prevented her from getting her surgery which is now scheduled for October 18.  She knows to hold her Ozempic prior to that by 1 week but was not sure which medications were okay to take as she has misplaced her preop information.  2.  Hypothyroidism No reports of change in bowel habits, weight, difficulty swallowing.  She is compliant with Synthroid.  ROS: Per HPI  Allergies  Allergen Reactions   Jardiance [Empagliflozin] Other (See Comments)    RECURRENT VAGINITIS   Quinapril Hcl Cough   Statins Other (See Comments)    Not all Statins but some cause cough and pain in legs.   Tape Other (See Comments)    Redness, please use "paper" tape   Past Medical History:  Diagnosis Date   Anemia    Arthritis    Atrial fibrillation, rapid (HCC) 10/27/2013   Bilateral carotid artery disease (HCC)    Complication of anesthesia    Diabetes mellitus without complication (HCC)    Difficult intubation    states 'lady that did the sleep study told me I have the smallest airway she has ever seen in an adult"   Dyslipidemia 06/29/2017   History of blood transfusion    GI bleed   Hypertension    Hypothyroid    Obesity    Obstructive sleep apnea    on C Pap   Peripheral vascular disease (HCC)     Tuberculosis    Upper GI bleed 09/25/2019    Current Outpatient Medications:    aspirin EC 81 MG tablet, Take 81 mg by mouth daily., Disp: , Rfl:    bisacodyl (DULCOLAX) 5 MG EC tablet, Take 5 mg by mouth daily as needed (constipation)., Disp: , Rfl:    cephALEXin (KEFLEX) 500 MG capsule, Take 1 capsule (500 mg total) by mouth 3 (three) times daily., Disp: 14 capsule, Rfl: 0   clopidogrel (PLAVIX) 75 MG tablet, Take 1 tablet (75 mg total) by mouth daily., Disp: 30 tablet, Rfl: 6   diltiazem (CARDIZEM CD) 180 MG 24 hr capsule, TAKE 1 CAPSULE BY MOUTH EVERY DAY, Disp: 90 capsule, Rfl: 2   diltiazem (CARDIZEM) 30 MG tablet, Take 30 mg by mouth 3 (three) times daily as needed (afib)., Disp: , Rfl:    ezetimibe (ZETIA) 10 MG tablet, TAKE 1 TABLET BY MOUTH EVERY DAY, Disp: 90 tablet, Rfl: 3   flecainide (TAMBOCOR) 100 MG tablet, TAKE 1 TABLET BY MOUTH TWICE A DAY, Disp: 180 tablet, Rfl: 3   furosemide (LASIX) 20 MG tablet, Take 1 tablet (20 mg total) by mouth daily as needed for fluid., Disp: 90 tablet, Rfl: 0   gabapentin (NEURONTIN) 300 MG capsule, Take 1 capsule (300 mg total) by mouth 2 (two) times daily as needed (neuropathy right leg). (Patient  taking differently: Take 300 mg by mouth in the morning and at bedtime.), Disp: 180 capsule, Rfl: 2   glipiZIDE (GLUCOTROL) 5 MG tablet, TAKE 1 TABLET BY MOUTH TWICE A DAY, Disp: 180 tablet, Rfl: 0   HYDROcodone-acetaminophen (NORCO) 5-325 MG tablet, Take 1 tablet by mouth every 6 (six) hours as needed for severe pain., Disp: 20 tablet, Rfl: 0   levothyroxine (SYNTHROID) 112 MCG tablet, TAKE 1 TABLET BY MOUTH EVERY OTHER DAY, ALTERNATING WITH 1 & 1/2 TABLETS EVERY OTHER DAY, Disp: 112 tablet, Rfl: 3   linaclotide (LINZESS) 72 MCG capsule, Take 72 mcg by mouth daily before breakfast., Disp: , Rfl:    losartan (COZAAR) 25 MG tablet, TAKE 1 TABLET (25 MG TOTAL) BY MOUTH DAILY., Disp: 90 tablet, Rfl: 3   lovastatin (MEVACOR) 40 MG tablet, TAKE 1 TABLET BY  MOUTH EVERY DAY IN THE EVENING, Disp: 90 tablet, Rfl: 3   metFORMIN (GLUCOPHAGE) 1000 MG tablet, TAKE 1 TABLET (1,000 MG TOTAL) BY MOUTH TWICE A DAY WITH FOOD, Disp: 180 tablet, Rfl: 0   metoprolol succinate (TOPROL-XL) 100 MG 24 hr tablet, TAKE 1 TABLET BY MOUTH EVERY DAY WITH OR IMMEDIATELY FOLLOWING A MEAL, Disp: 90 tablet, Rfl: 0   psyllium (METAMUCIL) 58.6 % packet, Take 1 packet by mouth every other day as needed (regularity)., Disp: , Rfl:    rOPINIRole (REQUIP) 0.5 MG tablet, TAKE 1 TABLET BY MOUTH EVERYDAY AT BEDTIME, Disp: 90 tablet, Rfl: 0   Semaglutide,0.25 or 0.5MG /DOS, 2 MG/3ML SOPN, Inject 0.5 mg into the skin once a week. (Patient not taking: Reported on 02/25/2023), Disp: 3 mL, Rfl:   Current Facility-Administered Medications:    cyanocobalamin ((VITAMIN B-12)) injection 1,000 mcg, 1,000 mcg, Intramuscular, Q30 days, Elenora Gamma, MD, 1,000 mcg at 02/09/23 1052 Social History   Socioeconomic History   Marital status: Single    Spouse name: Not on file   Number of children: Not on file   Years of education: Not on file   Highest education level: Not on file  Occupational History   Not on file  Tobacco Use   Smoking status: Never    Passive exposure: Never   Smokeless tobacco: Never  Vaping Use   Vaping status: Never Used  Substance and Sexual Activity   Alcohol use: No    Alcohol/week: 0.0 standard drinks of alcohol   Drug use: Never   Sexual activity: Never  Other Topics Concern   Not on file  Social History Narrative   Not on file   Social Determinants of Health   Financial Resource Strain: Low Risk  (04/21/2022)   Overall Financial Resource Strain (CARDIA)    Difficulty of Paying Living Expenses: Not hard at all  Food Insecurity: No Food Insecurity (04/21/2022)   Hunger Vital Sign    Worried About Running Out of Food in the Last Year: Never true    Ran Out of Food in the Last Year: Never true  Transportation Needs: No Transportation Needs  (04/21/2022)   PRAPARE - Administrator, Civil Service (Medical): No    Lack of Transportation (Non-Medical): No  Physical Activity: Inactive (04/21/2022)   Exercise Vital Sign    Days of Exercise per Week: 0 days    Minutes of Exercise per Session: 0 min  Stress: No Stress Concern Present (04/21/2022)   [employer] of Occupational Health - Occupational Stress Questionnaire    Feeling of Stress : Not at all  Social Connections: Moderately Integrated (04/21/2022)  Social Advertising account executive [NHANES]    Frequency of Communication with Friends and Family: More than three times a week    Frequency of Social Gatherings with Friends and Family: Twice a week    Attends Religious Services: More than 4 times per year    Active Member of [employer] or Organizations: Yes    Attends Engineer, structural: More than 4 times per year    Marital Status: Never married  Intimate Partner Violence: Unknown (09/02/2021)   Received from [employer], [facility]   HITS    Physically Hurt: Not on file    Insult or Talk Down To: Not on file    Threaten Physical Harm: Not on file    Scream or Curse: Not on file   Family History  Problem Relation Age of Onset   COPD Father    Heart failure Father    Heart disease Father    Emphysema Father    Arrhythmia Sister    Arrhythmia Sister    Arrhythmia Sister        had PPM also   Cancer Sister     Objective: Office vital signs reviewed. BP (!) 104/58   Pulse 64   Temp 98.6 F (37 C)   Ht 5\' 2"  (1.575 m)   Wt 217 lb (98.4 kg)   SpO2 95%   BMI 39.69 kg/m   Physical Examination:  General: Awake, alert, well-appearing female in no acute distress HEENT: Sclera white.  Moist mucous membranes. Cardio: regular rate and rhythm, S1S2 heard, blowing systolic murmurs appreciated Pulm: clear to auscultation bilaterally, no wheezes, rhonchi or rales; normal work of breathing on room air Neuro: see DM foot  Diabetic Foot  Exam - Simple   Simple Foot Form Diabetic Foot exam was performed with the following findings: Yes 03/02/2023  5:57 PM  Visual Inspection No deformities, no ulcerations, no other skin breakdown bilaterally: Yes Sensation Testing Intact to touch and monofilament testing bilaterally: Yes Pulse Check Posterior Tibialis and Dorsalis pulse intact bilaterally: Yes Comments      Assessment/ Plan: 72 y.o. female   Type 2 diabetes mellitus with hyperlipidemia (HCC) - Plan: glipiZIDE (GLUCOTROL) 5 MG tablet, metFORMIN (GLUCOPHAGE) 1000 MG tablet  Hypertension associated with diabetes (HCC)  Hyperlipidemia associated with type 2 diabetes mellitus (HCC)  Long-term current use of injectable noninsulin antidiabetic medication  Recent urinary tract infection - Plan: Urine Culture, Urinalysis, Routine w reflex microscopic, Microscopic Examination  Lumbar radiculopathy, right - Plan: gabapentin (NEURONTIN) 300 MG capsule  Hypothyroidism due to acquired atrophy of thyroid - Plan: levothyroxine (SYNTHROID) 112 MCG tablet   Her sugar is well-controlled based on the A1c collected on 02/16/2023.  In fact have advised her to discontinue use of glipizide in the evening time and just continue the morning dose.  I anticipate we will likely be able to discontinue both doses.  She will continue Ozempic, metformin as prescribed for now and we will determine ongoing reduction in medication regimen pending A1c in 3 months.  She will continue current regimen for blood pressure though I suspect that we may need to back down on her losartan if she continues to have borderline blood pressures.  She is totally asymptomatic today so we will no medication adjustments for now  She will continue current cholesterol regimen  Her urinalysis did not demonstrate ongoing signs of persistent infection.  In fact given asymptomatic nature I wonder if perhaps this was just some colonization  Gabapentin renewed though  we did  not discuss lumbar radiculopathy in detail today  I renewed her thyroid medication as well and we will plan for thyroid levels at next visit  Raliegh Ip, DO [facility] [phone]

## 2023-03-02 ENCOUNTER — Encounter: Payer: Self-pay | Admitting: Family Medicine

## 2023-03-02 ENCOUNTER — Ambulatory Visit (INDEPENDENT_AMBULATORY_CARE_PROVIDER_SITE_OTHER): Payer: Medicare Other | Admitting: Family Medicine

## 2023-03-02 ENCOUNTER — Telehealth: Payer: Self-pay | Admitting: Family Medicine

## 2023-03-02 VITALS — BP 104/58 | HR 64 | Temp 98.6°F | Ht 62.0 in | Wt 217.0 lb

## 2023-03-02 DIAGNOSIS — E1159 Type 2 diabetes mellitus with other circulatory complications: Secondary | ICD-10-CM | POA: Diagnosis not present

## 2023-03-02 DIAGNOSIS — Z7985 Long-term (current) use of injectable non-insulin antidiabetic drugs: Secondary | ICD-10-CM

## 2023-03-02 DIAGNOSIS — E1169 Type 2 diabetes mellitus with other specified complication: Secondary | ICD-10-CM

## 2023-03-02 DIAGNOSIS — Z87440 Personal history of urinary (tract) infections: Secondary | ICD-10-CM

## 2023-03-02 DIAGNOSIS — E034 Atrophy of thyroid (acquired): Secondary | ICD-10-CM | POA: Diagnosis not present

## 2023-03-02 DIAGNOSIS — E785 Hyperlipidemia, unspecified: Secondary | ICD-10-CM | POA: Diagnosis not present

## 2023-03-02 DIAGNOSIS — M5416 Radiculopathy, lumbar region: Secondary | ICD-10-CM

## 2023-03-02 DIAGNOSIS — I152 Hypertension secondary to endocrine disorders: Secondary | ICD-10-CM

## 2023-03-02 DIAGNOSIS — N390 Urinary tract infection, site not specified: Secondary | ICD-10-CM | POA: Diagnosis not present

## 2023-03-02 LAB — MICROSCOPIC EXAMINATION
RBC, Urine: NONE SEEN /[HPF] (ref 0–2)
Renal Epithel, UA: NONE SEEN /[HPF]
Yeast, UA: NONE SEEN

## 2023-03-02 LAB — URINALYSIS, ROUTINE W REFLEX MICROSCOPIC
Bilirubin, UA: NEGATIVE
Glucose, UA: NEGATIVE
Ketones, UA: NEGATIVE
Nitrite, UA: NEGATIVE
Protein,UA: NEGATIVE
RBC, UA: NEGATIVE
Specific Gravity, UA: 1.02 (ref 1.005–1.030)
Urobilinogen, Ur: 0.2 mg/dL (ref 0.2–1.0)
pH, UA: 6.5 (ref 5.0–7.5)

## 2023-03-02 MED ORDER — METFORMIN HCL 1000 MG PO TABS: 1000.0000 mg | ORAL_TABLET | Freq: Two times a day (BID) | ORAL | 3 refills | Status: AC

## 2023-03-02 MED ORDER — GABAPENTIN 300 MG PO CAPS
300.0000 mg | ORAL_CAPSULE | Freq: Two times a day (BID) | ORAL | 3 refills | Status: DC | PRN
Start: 2023-03-02 — End: 2024-01-03

## 2023-03-02 MED ORDER — ACCU-CHEK GUIDE VI STRP: ORAL_STRIP | 3 refills | Status: DC

## 2023-03-02 MED ORDER — GLIPIZIDE 5 MG PO TABS
5.0000 mg | ORAL_TABLET | Freq: Every day | ORAL | 3 refills | Status: DC
Start: 2023-03-02 — End: 2024-01-03

## 2023-03-02 MED ORDER — ACCU-CHEK SOFTCLIX LANCETS MISC: 3 refills | Status: DC

## 2023-03-02 MED ORDER — LEVOTHYROXINE SODIUM 112 MCG PO TABS
ORAL_TABLET | ORAL | 3 refills | Status: DC
Start: 2023-03-02 — End: 2023-11-30

## 2023-03-02 MED ORDER — ROPINIROLE HCL 0.5 MG PO TABS: 0.5000 mg | ORAL_TABLET | Freq: Every day | ORAL | 3 refills | Status: DC

## 2023-03-02 NOTE — Patient Instructions (Addendum)
A1c 5.1 at preop. Ok to back down to just 1 glipizide per day now.

## 2023-03-02 NOTE — Telephone Encounter (Signed)
LMOVM refills sent to pharmacy °

## 2023-03-02 NOTE — Telephone Encounter (Signed)
  Prescription Request  03/02/2023  Is this a "Controlled Substance" medicine? no  Have you seen your PCP in the last 2 weeks? no  If YES, route message to pool  -  If NO, patient needs to be scheduled for appointment.  What is the name of the medication or equipment? RX for [employer] me test strips and soft clicks lancets  Have you contacted your pharmacy to request a refill? NO   Which pharmacy would you like this sent to? [facility] in [place]   Patient notified that their request is being sent to the clinical staff for review and that they should receive a response within 2 business days.

## 2023-03-04 ENCOUNTER — Telehealth: Payer: Self-pay | Admitting: Family Medicine

## 2023-03-04 LAB — URINE CULTURE: Organism ID, Bacteria: NO GROWTH

## 2023-03-08 DIAGNOSIS — R001 Bradycardia, unspecified: Secondary | ICD-10-CM | POA: Diagnosis not present

## 2023-03-08 DIAGNOSIS — R42 Dizziness and giddiness: Secondary | ICD-10-CM | POA: Diagnosis not present

## 2023-03-09 ENCOUNTER — Other Ambulatory Visit (HOSPITAL_COMMUNITY): Payer: Medicare Other

## 2023-03-12 ENCOUNTER — Telehealth: Payer: Self-pay | Admitting: Cardiology

## 2023-03-12 NOTE — Telephone Encounter (Signed)
Patient c/o Palpitations:  High priority if patient c/o lightheadedness, shortness of breath, or chest pain  How long have you had palpitations/irregular HR/ Afib? Are you having the symptoms now? No. Patient states that on 10/07 at 1:30 A.M. to 2:30 A.M. her HR went out of rhythm and HR was fluctuating between 130 to 90 and then 60 for an hour.   Are you currently experiencing lightheadedness, SOB or CP? No   Do you have a history of afib (atrial fibrillation) or irregular heart rhythm? Yes   Have you checked your BP or HR? (document readings if available): See above. No BP readings   Are you experiencing any other symptoms? No   Patient states she wanted Dr. Antoine Poche to know that she had experienced afib 10/07 and wanted to have it noted. She is scheduled to have surgery on 10/18.

## 2023-03-12 NOTE — Telephone Encounter (Signed)
Called pt she states she had a.fib on 10/07 from 1:30-2:30. She denies any more episodes since that day. Pt denies pain, SOB or sweating at that time. She states to went from laying on her right side than moved to her back. "I have not had any issues since than." I have not had an episode in 2 years, this was a minor one but I wanted to let him know. Pt states she is having carotid stents  placed on the 18th of this month. Pt is not sure if she is cleared by our office or not. Will forward message to Dr. Antoine Poche to clarify. Pt would like a call back to let her know if she is cleared (she wants a call before Tuesday).

## 2023-03-15 ENCOUNTER — Encounter (HOSPITAL_COMMUNITY): Payer: Self-pay

## 2023-03-15 NOTE — Progress Notes (Signed)
Surgical Instructions    Your procedure is scheduled on Friday, 03/19/23.  Report to [facility] Main Entrance "A" at 7:55 A.M., then check in with the Admitting office.  Call this number if you have problems the morning of surgery:  [phone]   If you have any questions prior to your surgery date call [phone]: Open Monday-Friday 8am-4pm If you experience any cold or flu symptoms such as cough, fever, chills, shortness of breath, etc. between now and your scheduled surgery, please notify us at the above number     Remember:  Do not eat or drink after midnight the night before your surgery-Thursday.     Take these medicines the morning of surgery with A SIP OF WATER (Fri):  aspirin EC  clopidogrel (PLAVIX)  diltiazem (CARDIZEM CD)  ezetimibe (ZETIA)  flecainide (TAMBOCOR)  gabapentin (NEURONTIN) if needed HYDROcodone-acetaminophen (NORCO) if needed levothyroxine (SYNTHROID)  metoprolol succinate (TOPROL-XL)   WHAT DO I DO ABOUT MY DIABETES MEDICATION?   Do not take Metformin and Glipizide on the morning of surgery.   HOW TO MANAGE YOUR DIABETES BEFORE AND AFTER SURGERY  Why is it important to control my blood sugar before and after surgery? Improving blood sugar levels before and after surgery helps healing and can limit problems. A way of improving blood sugar control is eating a healthy diet by:  Eating less sugar and carbohydrates  Increasing activity/exercise  Talking with your doctor about reaching your blood sugar goals High blood sugars (greater than 180 mg/dL) can raise your risk of infections and slow your recovery, so you will need to focus on controlling your diabetes during the weeks before surgery. Make sure that the doctor who takes care of your diabetes knows about your planned surgery including the date and location.  How do I manage my blood sugar before surgery? Check your blood sugar at least 4 times a day, starting 2 days before surgery, to  make sure that the level is not too high or low.  Check your blood sugar the morning of your surgery when you wake up and every 2 hours until you get to the Short Stay unit.  If your blood sugar is less than 70 mg/dL, you will need to treat for low blood sugar: Treat a low blood sugar (less than 70 mg/dL) with  cup of clear juice (cranberry or apple), 4 glucose tablets, OR glucose gel. Recheck blood sugar in 15 minutes after treatment (to make sure it is greater than 70 mg/dL). If your blood sugar is not greater than 70 mg/dL on recheck, call [phone] for further instructions.  Report your blood sugar to the short stay nurse when you get to Short Stay.  If you are admitted to the hospital after surgery: Your blood sugar will be checked by the staff and you will probably be given insulin after surgery (instead of oral diabetes medicines) to make sure you have good blood sugar levels. The goal for blood sugar control after surgery is 80-180 mg/dL.    As of today, STOP taking any Aspirin (unless otherwise instructed by your surgeon) Aleve, Naproxen, Ibuprofen, Motrin, Advil, Goody's, BC's, all herbal medications, fish oil, and all vitamins.           Do not wear jewelry or makeup. Do not wear lotions, powders, perfumes or deodorant. Do not shave 48 hours prior to surgery.   Do not bring valuables to the hospital. Do not wear nail polish, gel polish, artificial nails, or any other type  of covering on natural nails (fingers and toes) If you have artificial nails or gel coating that need to be removed by a nail salon, please have this removed prior to surgery. Artificial nails or gel coating may interfere with anesthesia's ability to adequately monitor your vital signs.  [facility] is not responsible for any belongings or valuables.    Do NOT Smoke (Tobacco/Vaping)  24 hours prior to your procedure  If you use a CPAP at night, you may bring your mask for your overnight stay.    Contacts, glasses, hearing aids, dentures or partials may not be worn into surgery, please bring cases for these belongings   For patients admitted to the hospital, discharge time will be determined by your treatment team.   Patients discharged the day of surgery will not be allowed to drive home, and someone needs to stay with them for 24 hours.   SURGICAL WAITING ROOM VISITATION Patients having surgery or a procedure may have no more than 2 support people in the waiting area - these visitors may rotate.   Children under the age of 28 must have an adult with them who is not the patient. If the patient needs to stay at the hospital during part of their recovery, the visitor guidelines for inpatient rooms apply. Pre-op nurse will coordinate an appropriate time for 1 support person to accompany patient in pre-op.  This support person may not rotate.   Please refer to [email or web address] for the visitor guidelines for Inpatients (after your surgery is over and you are in a regular room).    Special instructions:    Oral Hygiene is also important to reduce your risk of infection.  Remember - BRUSH YOUR TEETH THE MORNING OF SURGERY WITH YOUR REGULAR TOOTHPASTE   [facility]- Preparing For Surgery  Before surgery, you can play an important role. Because skin is not sterile, your skin needs to be as free of germs as possible. You can reduce the number of germs on your skin by washing with CHG (chlorahexidine gluconate) Soap before surgery.  CHG is an antiseptic cleaner which kills germs and bonds with the skin to continue killing germs even after washing.     Please do not use if you have an allergy to CHG or antibacterial soaps. If your skin becomes reddened/irritated stop using the CHG.  Do not shave (including legs and underarms) for at least 48 hours prior to first CHG shower. It is OK to shave your face.  Please follow these instructions  carefully.     Shower the NIGHT BEFORE SURGERY-Thurs and the MORNING OF SURGERY-Fri with CHG Soap.   If you chose to wash your hair, wash your hair first as usual with your normal shampoo. After you shampoo, rinse your hair and body thoroughly to remove the shampoo.  Then [employer] and genitals (private parts) with your normal soap and rinse thoroughly to remove soap.  After that Use CHG Soap as you would any other liquid soap. You can apply CHG directly to the skin and wash gently with a scrungie or a clean washcloth.   Apply the CHG Soap to your body ONLY FROM THE NECK DOWN.  Do not use on open wounds or open sores. Avoid contact with your eyes, ears, mouth and genitals (private parts). Wash Face and genitals (private parts)  with your normal soap.   Wash thoroughly, paying special attention to the area where your surgery will be performed.  Thoroughly rinse your body with  warm water from the neck down.  DO NOT shower/wash with your normal soap after using and rinsing off the CHG Soap.  Pat yourself dry with a CLEAN TOWEL.  Wear CLEAN PAJAMAS to bed the night before surgery  Place CLEAN SHEETS on your bed the night before your surgery  DO NOT SLEEP WITH PETS.   Day of Surgery:  Take a shower with CHG soap. Wear Clean/Comfortable clothing the morning of surgery Do not apply any deodorants/lotions.   Remember to brush your teeth WITH YOUR REGULAR TOOTHPASTE.    If you received a COVID test during your pre-op visit, it is requested that you wear a mask when out in public, stay away from anyone that may not be feeling well, and notify your surgeon if you develop symptoms. If you have been in contact with anyone that has tested positive in the last 10 days, please notify your surgeon.    Please read over the following fact sheets that you were given.

## 2023-03-15 NOTE — Progress Notes (Signed)
PCP - Delynn Flavin, DO Cardiologist - Dr Rollene Rotunda  Chest x-ray - n/a EKG - 02/25/23 Stress Test - 09/17/20 ECHO - 02/22/23 Cardiac Cath - n/a  ICD Pacemaker/Loop - n/a  Sleep Study -  Yes CPAP - occasional uses CPAP  Diabetes Type 2 Do not take Metformin and Glipizide on the morning of surgery.  Hold Ozempic for 1 week prior to surgery.  Last dose was on 03/05/23  If your blood sugar is less than 70 mg/dL, you will need to treat for low blood sugar: Treat a low blood sugar (less than 70 mg/dL) with  cup of clear juice (cranberry or apple), 4 glucose tablets, OR glucose gel. Recheck blood sugar in 15 minutes after treatment (to make sure it is greater than 70 mg/dL). If your blood sugar is not greater than 70 mg/dL on recheck, call [phone] for further instructions.  ASA/Plavix Instructions:  Continue Plavix and ASA per MD instructions.    NPO  Anesthesia review: Yes   STOP now taking any Aspirin (unless otherwise instructed by your surgeon), Aleve, Naproxen, Ibuprofen, Motrin, Advil, Goody's, BC's, all herbal medications, fish oil, and all vitamins.   Coronavirus Screening Do you have any of the following symptoms:  Cough yes/no: No Fever (>100.37F)  yes/no: No Runny nose yes/no: No Sore throat yes/no: No Difficulty breathing/shortness of breath  Yes, occasional  Have you traveled in the last 14 days and where? yes/no: No  Patient verbalized understanding of instructions that were given to them at the PAT appointment. Patient was also instructed that they will need to review over the PAT instructions again at home before surgery.

## 2023-03-16 ENCOUNTER — Ambulatory Visit: Payer: Medicare Other

## 2023-03-16 ENCOUNTER — Encounter (HOSPITAL_COMMUNITY): Payer: Self-pay

## 2023-03-16 ENCOUNTER — Encounter (HOSPITAL_COMMUNITY)
Admission: RE | Admit: 2023-03-16 | Discharge: 2023-03-16 | Disposition: A | Discharge status: Home / Self Care | Payer: Medicare Other | Source: Ambulatory Visit | Attending: Surgery | Admitting: Surgery

## 2023-03-16 ENCOUNTER — Ambulatory Visit (INDEPENDENT_AMBULATORY_CARE_PROVIDER_SITE_OTHER): Payer: Medicare Other

## 2023-03-16 ENCOUNTER — Other Ambulatory Visit: Payer: Self-pay

## 2023-03-16 VITALS — BP 113/61 | HR 40 | Temp 98.4°F | Resp 18 | Ht 62.0 in | Wt 219.9 lb

## 2023-03-16 DIAGNOSIS — E785 Hyperlipidemia, unspecified: Secondary | ICD-10-CM | POA: Insufficient documentation

## 2023-03-16 DIAGNOSIS — I6523 Occlusion and stenosis of bilateral carotid arteries: Secondary | ICD-10-CM | POA: Insufficient documentation

## 2023-03-16 DIAGNOSIS — I480 Paroxysmal atrial fibrillation: Secondary | ICD-10-CM | POA: Insufficient documentation

## 2023-03-16 DIAGNOSIS — E538 Deficiency of other specified B group vitamins: Secondary | ICD-10-CM | POA: Diagnosis not present

## 2023-03-16 DIAGNOSIS — Z01812 Encounter for preprocedural laboratory examination: Secondary | ICD-10-CM | POA: Insufficient documentation

## 2023-03-16 DIAGNOSIS — I080 Rheumatic disorders of both mitral and aortic valves: Secondary | ICD-10-CM | POA: Insufficient documentation

## 2023-03-16 DIAGNOSIS — I10 Essential (primary) hypertension: Secondary | ICD-10-CM | POA: Insufficient documentation

## 2023-03-16 DIAGNOSIS — Z01818 Encounter for other preprocedural examination: Secondary | ICD-10-CM

## 2023-03-16 DIAGNOSIS — G4733 Obstructive sleep apnea (adult) (pediatric): Secondary | ICD-10-CM | POA: Insufficient documentation

## 2023-03-16 DIAGNOSIS — E039 Hypothyroidism, unspecified: Secondary | ICD-10-CM | POA: Insufficient documentation

## 2023-03-16 DIAGNOSIS — Z7984 Long term (current) use of oral hypoglycemic drugs: Secondary | ICD-10-CM | POA: Insufficient documentation

## 2023-03-16 DIAGNOSIS — E1151 Type 2 diabetes mellitus with diabetic peripheral angiopathy without gangrene: Secondary | ICD-10-CM | POA: Insufficient documentation

## 2023-03-16 LAB — URINALYSIS, ROUTINE W REFLEX MICROSCOPIC
Bilirubin Urine: NEGATIVE
Glucose, UA: NEGATIVE mg/dL
Hgb urine dipstick: NEGATIVE
Ketones, ur: NEGATIVE mg/dL
Leukocytes,Ua: NEGATIVE
Nitrite: NEGATIVE
Protein, ur: NEGATIVE mg/dL
Specific Gravity, Urine: 1.01 (ref 1.005–1.030)
pH: 6 (ref 5.0–8.0)

## 2023-03-16 LAB — CBC
HCT: 35.8 % — ABNORMAL LOW (ref 36.0–46.0)
Hemoglobin: 11 g/dL — ABNORMAL LOW (ref 12.0–15.0)
MCH: 28.8 pg (ref 26.0–34.0)
MCHC: 30.7 g/dL (ref 30.0–36.0)
MCV: 93.7 fL (ref 80.0–100.0)
Platelets: 264 10*3/uL (ref 150–400)
RBC: 3.82 MIL/uL — ABNORMAL LOW (ref 3.87–5.11)
RDW: 15.9 % — ABNORMAL HIGH (ref 11.5–15.5)
WBC: 9 10*3/uL (ref 4.0–10.5)
nRBC: 0 % (ref 0.0–0.2)

## 2023-03-16 LAB — TYPE AND SCREEN
ABO/RH(D): A NEG
Antibody Screen: NEGATIVE

## 2023-03-16 LAB — COMPREHENSIVE METABOLIC PANEL
ALT: 23 U/L (ref 0–44)
AST: 29 U/L (ref 15–41)
Albumin: 4 g/dL (ref 3.5–5.0)
Alkaline Phosphatase: 102 U/L (ref 38–126)
Anion gap: 8 (ref 5–15)
BUN: 15 mg/dL (ref 8–23)
CO2: 26 mmol/L (ref 22–32)
Calcium: 9.3 mg/dL (ref 8.9–10.3)
Chloride: 101 mmol/L (ref 98–111)
Creatinine, Ser: 0.74 mg/dL (ref 0.44–1.00)
GFR, Estimated: >60 mL/min (ref >60)
Glucose, Bld: 205 mg/dL — ABNORMAL HIGH (ref 70–99)
Potassium: 4.2 mmol/L (ref 3.5–5.1)
Sodium: 135 mmol/L (ref 135–145)
Total Bilirubin: 0.7 mg/dL (ref 0.3–1.2)
Total Protein: 7.2 g/dL (ref 6.5–8.1)

## 2023-03-16 LAB — APTT: aPTT: 28 s (ref 24–36)

## 2023-03-16 LAB — SURGICAL PCR SCREEN
MRSA, PCR: NEGATIVE
Staphylococcus aureus: NEGATIVE

## 2023-03-16 LAB — GLUCOSE, CAPILLARY: Glucose-Capillary: 220 mg/dL — ABNORMAL HIGH (ref 70–99)

## 2023-03-16 LAB — HEMOGLOBIN A1C
Hgb A1c MFr Bld: 5.8 % — ABNORMAL HIGH (ref 4.8–5.6)
Mean Plasma Glucose: 119.76 mg/dL

## 2023-03-16 LAB — PROTIME-INR
INR: 1 (ref 0.8–1.2)
Prothrombin Time: 13.6 s (ref 11.4–15.2)

## 2023-03-16 NOTE — Progress Notes (Signed)
Cyanocobalamin injection given to left deltoid.  Patient tolerated well. 

## 2023-03-17 ENCOUNTER — Telehealth: Payer: Self-pay | Admitting: Cardiology

## 2023-03-17 NOTE — Telephone Encounter (Signed)
Patient has pre opp on yesterday at another office for pre opp. Durning the appt her HR was in the 40s. She is very concerned and would like to speak to dr hochrein to see if she would be okay during the procedure. Please advise

## 2023-03-17 NOTE — Progress Notes (Signed)
Anesthesia Chart Review:   Case: [id number] Date/Time: 03/19/23 0940   Procedure: Transcarotid Artery Revascularization (Right)   Anesthesia type: General   Pre-op diagnosis: Bilateral carotid artery stenosis   [facility] ROOM 16 / [facility]   Surgeons: Nada Libman, MD       DISCUSSION: Patient is a 72 year old female scheduled for the above procedure. She has asymptomatic 80-99% RICA stenosis. She is s/p left CEA 08/30/15.   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 + COVID-19 01/25/23.  Surgery was initially scheduled for 02/24/23, but postponed due to low energy, bradycardia (briefly in the 30-40's, but then up to 50-60's), and RLE cellulitis with negative RLE venous US on 02/23/23. Dr. Myra Gianotti wanted her seen again by cardiology. She had follow-up with Dr. Antoine Poche on 02/25/23.  She reported that she felt a little lightheaded when her HR briefly dropped to the 30's, but otherwise no presyncope, syncope, chest pain, or new dyspnea. He ordered a 3 day monitor that showed no significant arrhythmias. Her 02/22/23 echo showed LVEF 70-75%, no regional wall motion abnormalities, mild LVH, grade 2 diastolic dysfunction, normal RV systolic function, moderately dilated LA, mild MR, mild MS with mean MV gradient 3.0 mmHg, moderate AS with mean AV gradient 24.2 mmHg. Per Dr. Antoine Poche, "She is due to have carotid endarterectomy. The patient has no high risk symptoms or findings currently. She is at acceptable risk for the planned surgery. No further testing is suggested." (She called Dr. Jenene Slicker office again on 03/17/23 to notify him the HR was 40 with 03/16/23 PAT  vitals.)   Reported difficulty airway. Glidescope and 3 used to 08/30/15 intubation, Grade 1 view. On 12/27/19, easy mask ventilation, 7.0 ETT placed using MacIntosh #4, Cormack-Lehane Classification: grade IIa - partial view of glottis, 1 attempt.   Last A1c 5.8% on 03/16/23. She is on glipizide, ,metformin, semaglutide. Last semaglutide 03/05/23.    She can continue ASA and Plavix per [facility].   Anesthesia team to evaluate on the day of surgery.     VS: BP 113/61   Pulse (!) 40   Temp 36.9 C (Oral)   Resp 18   Ht 5\' 2"  (1.575 m)   Wt 99.7 kg   SpO2 98%   BMI 40.22 kg/m    PROVIDERS: Raliegh Ip, DO is PCP  Rollene Rotunda, MD is cardiologist Venida Jarvis, MD is vascular surgeon   LABS: Labs reviewed: Acceptable for surgery. (all labs ordered are listed, but only abnormal results are displayed)  Labs Reviewed  GLUCOSE, CAPILLARY - Abnormal; Notable for the following components:      Result Value   Glucose-Capillary 220 (*)    All other components within normal limits  HEMOGLOBIN A1C - Abnormal; Notable for the following components:   Hgb A1c MFr Bld 5.8 (*)    All other components within normal limits  CBC - Abnormal; Notable for the following components:   RBC 3.82 (*)    Hemoglobin 11.0 (*)    HCT 35.8 (*)    RDW 15.9 (*)    All other components within normal limits  COMPREHENSIVE METABOLIC PANEL - Abnormal; Notable for the following components:   Glucose, Bld 205 (*)  All other components within normal limits  URINALYSIS, ROUTINE W REFLEX MICROSCOPIC - Abnormal; Notable for the following components:   APPearance HAZY (*)    All other components within normal limits  SURGICAL PCR SCREEN  PROTIME-INR  APTT  TYPE AND SCREEN     IMAGES: CTA Neck 01/22/23: IMPRESSION: 1. Left carotid endarterectomy without significant stenosis. 2. Dense calcifications at the right carotid bifurcation with at least 80% stenosis of the proximal right internal  carotid artery. 3. 50-60% stenosis of the proximal right vertebral artery, the dominant vessel. 4. 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 5. Faint opacification of the left V2 segment at the C4 level. A hypoplastic vessel is again occluded at the dural margin. There is some opacification of the distal V4 segment proximal to the vertebrobasilar junction. 6. Degenerative changes of the cervical spine, most evident at C6-7, left greater than right. 7. Patchy ground-glass attenuation at the lung apices bilaterally. This is nonspecific, but most likely represents atelectasis or edema. 8.  Aortic Atherosclerosis (ICD10-I70.0).   EKG: 02/25/23:  Normal sinus rhythm Right bundle branch block Left anterior fascicular block Bifascicular block Left ventricular hypertrophy with repolarization abnormality ( R in aVL , Romhilt-Estes ) Cannot rule out Septal infarct , age undetermined When compared with ECG of 30-Aug-2020 04:01, No significant change since last tracing Confirmed by Rollene Rotunda ([id number]) on 02/25/2023 3:41:38 PM   CV: Long term monitor 02/28/23 - 03/03/23: The predominant rhythm was normal sinus. Rare ectopy No sustained arrhythmias.   Venous US RLE 02/23/23: IMPRESSION: Directed duplex of the right lower extremity negative for DVT   Echo 02/22/23: IMPRESSIONS   1. Left ventricular ejection fraction, by estimation, is 70 to 75%. The  left ventricle has hyperdynamic function. The left ventricle has no  regional wall motion abnormalities. There is mild left ventricular  hypertrophy. Left ventricular diastolic  parameters are consistent with Grade II diastolic dysfunction  (pseudonormalization).   2. Right ventricular systolic function is normal. The right ventricular  size is normal.   3. Left atrial size was moderately dilated.   4. The mitral valve is normal in structure. Mild mitral valve  regurgitation. Mild mitral stenosis. The mean mitral valve gradient  is 3.0  mmHg. Severe mitral annular calcification.   5. The aortic valve is normal in structure. Aortic valve regurgitation is  not visualized. Moderate aortic valve stenosis. Aortic valve area, by VTI  measures 1.22 cm. Aortic valve mean gradient measures 24.2 mmHg. Aortic  valve Vmax measures 3.26 m/s.   6. The inferior vena cava is dilated in size with >50% respiratory  variability, suggesting right atrial pressure of 8 mmHg.  - Comparison(s): Prior aortic valve mean gradient 39 mmHg.    Carotid duplex 01/11/23: Summary:  Right Carotid: Velocities in the right ICA are consistent with a 80-99%  stenosis. Calcific plaque may obscure higher velocity. The ECA appears >50% stenosed.  Left Carotid: Velocities in the left ICA are consistent with a 1-39% stenosis.  Vertebrals: Right vertebral artery demonstrates antegrade flow. Atypical low velocity flow in the left vertebral artery.  Subclavians: Normal flow hemodynamics were seen in bilateral subclavian arteries.     Nuclear stress 09/17/20: There was no ST segment deviation noted during stress. Findings consistent with prior distal anteior/apical myocardial infarction with mild peri-infarct ischemia. The left ventricular ejection fraction is normal (55-65%). Low risk based on perfusion imaging. Elevated TID 1.41 may suggest higher relative risk.   Past Medical History:  Diagnosis Date  Anemia    Arthritis    Atrial fibrillation, rapid (HCC) 10/27/2013   Bilateral carotid artery disease (HCC)    Complication of anesthesia    difficult intubation in past   Diabetes mellitus without complication (HCC)    type 2   Difficult intubation    states 'lady that did the sleep study told me I have the smallest airway she has ever seen in an adult"   Dyslipidemia 06/29/2017   Heart murmur    History of blood transfusion 09/2019   GI bleed - in [facility]   Hypertension    Hypothyroid    Obesity    Obstructive sleep apnea    occasional uses  CPAP   Peripheral vascular disease (HCC)    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" - xrays were negative   Upper GI bleed 09/25/2019    Past Surgical History:  Procedure Laterality Date   CATARACT EXTRACTION Left    CATARACT EXTRACTION W/PHACO Right 12/09/2015   Procedure: CATARACT EXTRACTION PHACO AND INTRAOCULAR LENS PLACEMENT (IOC);  Surgeon: Gemma Payor, MD;  Location: [facility];  Service: Ophthalmology;  Laterality: Right;  CDE:10.48   COLONOSCOPY W/ POLYPECTOMY     cyst on back of neck removed     DILATION AND CURETTAGE OF UTERUS     x 5   ENDARTERECTOMY Left 08/30/2015   Procedure: LEFT CAROYID ENDARTERECTOMY WITH XENOSURE BOVINE PERICARDIUM PATCH ANGIOPLASTY;  Surgeon: Nada Libman, MD;  Location: [facility] OR;  Service: Vascular;  Laterality: Left;   TONSILLECTOMY     UPPER GI ENDOSCOPY     x several    MEDICATIONS:  levonorgestrel (MIRENA) 20 MCG/DAY IUD   Accu-Chek Softclix Lancets lancets   aspirin EC 81 MG tablet   bisacodyl (DULCOLAX) 5 MG EC tablet   clopidogrel (PLAVIX) 75 MG tablet   diltiazem (CARDIZEM CD) 180 MG 24 hr capsule   diltiazem (CARDIZEM) 30 MG tablet   ezetimibe (ZETIA) 10 MG tablet   flecainide (TAMBOCOR) 100 MG tablet   furosemide (LASIX) 20 MG tablet   gabapentin (NEURONTIN) 300 MG capsule   glipiZIDE (GLUCOTROL) 5 MG tablet   glucose blood (ACCU-CHEK GUIDE) test strip   HYDROcodone-acetaminophen (NORCO) 5-325 MG tablet   levothyroxine (SYNTHROID) 112 MCG tablet   linaclotide (LINZESS) 72 MCG capsule   losartan (COZAAR) 25 MG tablet   lovastatin (MEVACOR) 40 MG tablet   metFORMIN (GLUCOPHAGE) 1000 MG tablet   metoprolol succinate (TOPROL-XL) 100 MG 24 hr tablet   psyllium (METAMUCIL) 58.6 % packet   rOPINIRole (REQUIP) 0.5 MG tablet   Semaglutide,0.25 or 0.5MG /DOS, 2 MG/3ML SOPN    cyanocobalamin ((VITAMIN B-12)) injection 1,000 mcg    Shonna Chock, PA-C Surgical Short Stay/Anesthesiology [facility] Phone [phone] [facility] Phone [phone] 03/17/2023 2:07 PM

## 2023-03-17 NOTE — Telephone Encounter (Signed)
Spoke with pt, regarding monitor results. Aware she is cleared for her procedure.

## 2023-03-17 NOTE — Telephone Encounter (Signed)
She went to pre-op for Carotid artery surgery- getting stent in the right side. She just wanted to be sure that Dr Antoine Poche knew that her hr was 40 yesterday and make sure it is still ok to have surgery. I tried to reassure her; but she really wants to make sure that Dr Antoine Poche knows. Informed her that I would send the information to her provider and if there are any recommendations, then we will let her know.

## 2023-03-17 NOTE — Anesthesia Preprocedure Evaluation (Addendum)
Anesthesia Evaluation  Patient identified by MRN, date of birth, ID band Patient awake    Reviewed: Allergy & Precautions, H&P , NPO status , Patient's Chart, lab work & pertinent test results  History of Anesthesia Complications (+) DIFFICULT AIRWAY and history of anesthetic complications  Airway Mallampati: II   Neck ROM: full    Dental   Pulmonary sleep apnea    breath sounds clear to auscultation       Cardiovascular hypertension, + Peripheral Vascular Disease   Rhythm:regular Rate:Normal  Echo 02/22/23: IMPRESSIONS   1. Left ventricular ejection fraction, by estimation, is 70 to 75%. The  left ventricle has hyperdynamic function. The left ventricle has no  regional wall motion abnormalities. There is mild left ventricular  hypertrophy. Left ventricular diastolic  parameters are consistent with Grade II diastolic dysfunction  (pseudonormalization).   2. Right ventricular systolic function is normal. The right ventricular  size is normal.   3. Left atrial size was moderately dilated.   4. The mitral valve is normal in structure. Mild mitral valve  regurgitation. Mild mitral stenosis. The mean mitral valve gradient is 3.0  mmHg. Severe mitral annular calcification.   5. The aortic valve is normal in structure. Aortic valve regurgitation is  not visualized. Moderate aortic valve stenosis. Aortic valve area, by VTI  measures 1.22 cm. Aortic valve mean gradient measures 24.2 mmHg. Aortic  valve Vmax measures 3.26 m/s.   6. The inferior vena cava is dilated in size with >50% respiratory  variability, suggesting right atrial pressure of 8 mmHg.  - Comparison(s): Prior aortic valve mean gradient 39 mmHg.     Neuro/Psych  Neuromuscular disease    GI/Hepatic   Endo/Other  diabetes, Type 2Hypothyroidism  Morbid obesity  Renal/GU      Musculoskeletal  (+) Arthritis ,    Abdominal   Peds  Hematology   Anesthesia  Other Findings   Reproductive/Obstetrics                             Anesthesia Physical Anesthesia Plan  ASA: 3  Anesthesia Plan: General   Post-op Pain Management:    Induction: Intravenous  PONV Risk Score and Plan: 3 and Ondansetron, Dexamethasone and Treatment may vary due to age or medical condition  Airway Management Planned: Oral ETT  Additional Equipment: Arterial line  Intra-op Plan:   Post-operative Plan: Extubation in OR  Informed Consent: I have reviewed the patients History and Physical, chart, labs and discussed the procedure including the risks, benefits and alternatives for the proposed anesthesia with the patient or authorized representative who has indicated his/her understanding and acceptance.     Dental advisory given  Plan Discussed with: CRNA, Anesthesiologist and Surgeon  Anesthesia Plan Comments: (PAT note written 03/17/2023 by Shonna Chock, PA-C.  )        Anesthesia Quick Evaluation

## 2023-03-18 ENCOUNTER — Telehealth: Payer: Self-pay

## 2023-03-18 NOTE — Telephone Encounter (Signed)
-----   Message from Durene Cal sent at 03/17/2023  6:54 PM EDT ----- Regarding: RE: FYI- case 10/18 ok ----- Message ----- From: Primitivo Gauze, RN Sent: 03/17/2023   3:38 PM EDT To: Nada Libman, MD Subject: Lorain Childes- case 10/18                                WB  Patient called to make sure you were aware that her HR was 40 during her preadmission visit on yesterday. Patient previously wore a 3 day monitor, ordered by Dr. Antoine Poche, that showed no significant arrhythmias and he advised, patient has no high risk symptoms or findings currently. She is at acceptable risk for the planned surgery. No further testing is suggested. She also notified Dr. Jenene Slicker office today.   Georgianne Fick

## 2023-03-18 NOTE — Telephone Encounter (Signed)
Rollene Rotunda, MD  You5 minutes ago (12:32 PM)    OK to have the procedure.  They will monitor during the procedure.   Informed the pt of the information above. She was thankful and verbalized understanding.

## 2023-03-18 NOTE — Telephone Encounter (Signed)
Spoke with patient and advised that Dr. Myra Gianotti has been made aware of her heart rate. No recommendations given to postpone surgery or any additional follow up necessary. She voiced understanding and appreciation.

## 2023-03-19 ENCOUNTER — Inpatient Hospital Stay (HOSPITAL_COMMUNITY): Payer: Medicare Other

## 2023-03-19 ENCOUNTER — Inpatient Hospital Stay (HOSPITAL_COMMUNITY): Payer: Medicare Other | Admitting: Registered Nurse

## 2023-03-19 ENCOUNTER — Inpatient Hospital Stay (HOSPITAL_COMMUNITY)
Admission: RE | Admit: 2023-03-19 | Discharge: 2023-03-20 | DRG: 035 | Disposition: A | Discharge status: Home / Self Care | Payer: Medicare Other | Attending: Surgery | Admitting: Surgery

## 2023-03-19 ENCOUNTER — Other Ambulatory Visit: Payer: Self-pay

## 2023-03-19 ENCOUNTER — Inpatient Hospital Stay (HOSPITAL_COMMUNITY): Payer: Medicare Other | Admitting: Vascular Surgery

## 2023-03-19 ENCOUNTER — Encounter (HOSPITAL_COMMUNITY)
Admission: RE | Disposition: A | Discharge status: Home / Self Care | Payer: Self-pay | Source: Home / Self Care | Attending: Surgery

## 2023-03-19 ENCOUNTER — Encounter (HOSPITAL_COMMUNITY): Payer: Self-pay | Admitting: Surgery

## 2023-03-19 DIAGNOSIS — E039 Hypothyroidism, unspecified: Secondary | ICD-10-CM | POA: Diagnosis present

## 2023-03-19 DIAGNOSIS — Z8616 Personal history of COVID-19: Secondary | ICD-10-CM

## 2023-03-19 DIAGNOSIS — Z8249 Family history of ischemic heart disease and other diseases of the circulatory system: Secondary | ICD-10-CM | POA: Diagnosis not present

## 2023-03-19 DIAGNOSIS — Z7982 Long term (current) use of aspirin: Secondary | ICD-10-CM | POA: Diagnosis not present

## 2023-03-19 DIAGNOSIS — Z7902 Long term (current) use of antithrombotics/antiplatelets: Secondary | ICD-10-CM | POA: Diagnosis not present

## 2023-03-19 DIAGNOSIS — G4733 Obstructive sleep apnea (adult) (pediatric): Secondary | ICD-10-CM | POA: Diagnosis present

## 2023-03-19 DIAGNOSIS — I252 Old myocardial infarction: Secondary | ICD-10-CM | POA: Diagnosis not present

## 2023-03-19 DIAGNOSIS — Z7984 Long term (current) use of oral hypoglycemic drugs: Secondary | ICD-10-CM | POA: Diagnosis not present

## 2023-03-19 DIAGNOSIS — I480 Paroxysmal atrial fibrillation: Secondary | ICD-10-CM | POA: Diagnosis present

## 2023-03-19 DIAGNOSIS — E1151 Type 2 diabetes mellitus with diabetic peripheral angiopathy without gangrene: Secondary | ICD-10-CM | POA: Diagnosis present

## 2023-03-19 DIAGNOSIS — Z79890 Hormone replacement therapy (postmenopausal): Secondary | ICD-10-CM | POA: Diagnosis not present

## 2023-03-19 DIAGNOSIS — Z6841 Body Mass Index (BMI) 40.0 and over, adult: Secondary | ICD-10-CM | POA: Diagnosis not present

## 2023-03-19 DIAGNOSIS — Z825 Family history of asthma and other chronic lower respiratory diseases: Secondary | ICD-10-CM

## 2023-03-19 DIAGNOSIS — I10 Essential (primary) hypertension: Secondary | ICD-10-CM | POA: Diagnosis present

## 2023-03-19 DIAGNOSIS — E66813 Obesity, class 3: Secondary | ICD-10-CM | POA: Diagnosis present

## 2023-03-19 DIAGNOSIS — I6521 Occlusion and stenosis of right carotid artery: Secondary | ICD-10-CM | POA: Diagnosis present

## 2023-03-19 DIAGNOSIS — E785 Hyperlipidemia, unspecified: Secondary | ICD-10-CM | POA: Diagnosis present

## 2023-03-19 DIAGNOSIS — Z7985 Long-term (current) use of injectable non-insulin antidiabetic drugs: Secondary | ICD-10-CM

## 2023-03-19 DIAGNOSIS — Z79899 Other long term (current) drug therapy: Secondary | ICD-10-CM

## 2023-03-19 DIAGNOSIS — I6523 Occlusion and stenosis of bilateral carotid arteries: Secondary | ICD-10-CM

## 2023-03-19 DIAGNOSIS — I6529 Occlusion and stenosis of unspecified carotid artery: Principal | ICD-10-CM | POA: Diagnosis present

## 2023-03-19 DIAGNOSIS — M5416 Radiculopathy, lumbar region: Secondary | ICD-10-CM

## 2023-03-19 HISTORY — PX: ULTRASOUND GUIDANCE FOR VASCULAR ACCESS: SHX6516

## 2023-03-19 HISTORY — PX: TRANSCAROTID ARTERY REVASCULARIZATIONÂ: SHX6778

## 2023-03-19 LAB — GLUCOSE, CAPILLARY
Glucose-Capillary: 222 mg/dL — ABNORMAL HIGH (ref 70–99)
Glucose-Capillary: 167 mg/dL — ABNORMAL HIGH (ref 70–99)
Glucose-Capillary: 199 mg/dL — ABNORMAL HIGH (ref 70–99)
Glucose-Capillary: 215 mg/dL — ABNORMAL HIGH (ref 70–99)

## 2023-03-19 SURGERY — TRANSCAROTID ARTERY REVASCULARIZATION (TCAR)
Anesthesia: General | Site: Neck | Laterality: Right

## 2023-03-19 MED ORDER — SODIUM CHLORIDE 0.9 % IV SOLN: INTRAVENOUS | Status: DC

## 2023-03-19 MED ORDER — ACETAMINOPHEN 650 MG RE SUPP: 325.0000 mg | RECTAL | Status: DC | PRN

## 2023-03-19 MED ORDER — PHENOL 1.4 % MT LIQD: 1.0000 | OROMUCOSAL | Status: DC | PRN

## 2023-03-19 MED ORDER — FENTANYL CITRATE (PF) 250 MCG/5ML IJ SOLN
INTRAMUSCULAR | Status: AC
  Filled 2023-03-19: qty 5

## 2023-03-19 MED ORDER — FENTANYL CITRATE (PF) 250 MCG/5ML IJ SOLN
INTRAMUSCULAR | Status: DC | PRN
  Administered 2023-03-19: 50 ug via INTRAVENOUS
  Administered 2023-03-19: 25 ug via INTRAVENOUS
  Administered 2023-03-19: 50 ug via INTRAVENOUS

## 2023-03-19 MED ORDER — LEVOTHYROXINE SODIUM 112 MCG PO TABS
112.0000 ug | ORAL_TABLET | Freq: Every day | ORAL | Status: DC
  Administered 2023-03-20: 112 ug via ORAL
  Filled 2023-03-19: qty 1

## 2023-03-19 MED ORDER — FLECAINIDE ACETATE 100 MG PO TABS
100.0000 mg | ORAL_TABLET | Freq: Two times a day (BID) | ORAL | Status: DC
  Administered 2023-03-19 – 2023-03-20 (×2): 100 mg via ORAL
  Filled 2023-03-19 (×3): qty 1

## 2023-03-19 MED ORDER — EPHEDRINE 5 MG/ML INJ
INTRAVENOUS | Status: AC
  Filled 2023-03-19: qty 5

## 2023-03-19 MED ORDER — PHENYLEPHRINE HCL-NACL 20-0.9 MG/250ML-% IV SOLN
INTRAVENOUS | Status: DC | PRN
  Administered 2023-03-19: 40 ug/min via INTRAVENOUS

## 2023-03-19 MED ORDER — 0.9 % SODIUM CHLORIDE (POUR BTL) OPTIME
TOPICAL | Status: DC | PRN
  Administered 2023-03-19: 1000 mL

## 2023-03-19 MED ORDER — PANTOPRAZOLE SODIUM 40 MG PO TBEC
40.0000 mg | DELAYED_RELEASE_TABLET | Freq: Every day | ORAL | Status: DC
  Administered 2023-03-20: 40 mg via ORAL
  Filled 2023-03-19: qty 1

## 2023-03-19 MED ORDER — OXYCODONE HCL 5 MG PO TABS
5.0000 mg | ORAL_TABLET | Freq: Once | ORAL | Status: AC | PRN
  Administered 2023-03-19: 5 mg via ORAL

## 2023-03-19 MED ORDER — HEPARIN SODIUM (PORCINE) 1000 UNIT/ML IJ SOLN
INTRAMUSCULAR | Status: DC | PRN
Start: 2023-03-19 — End: 2023-03-19
  Administered 2023-03-19: 10000 [IU] via INTRAVENOUS

## 2023-03-19 MED ORDER — SODIUM CHLORIDE 0.9 % IV SOLN: 500.0000 mL | Freq: Once | INTRAVENOUS | Status: DC | PRN

## 2023-03-19 MED ORDER — HYDRALAZINE HCL 20 MG/ML IJ SOLN
10.0000 mg | Freq: Once | INTRAMUSCULAR | Status: AC
  Administered 2023-03-19: 10 mg via INTRAVENOUS

## 2023-03-19 MED ORDER — METOPROLOL TARTRATE 5 MG/5ML IV SOLN: 2.0000 mg | INTRAVENOUS | Status: DC | PRN

## 2023-03-19 MED ORDER — ONDANSETRON HCL 4 MG/2ML IJ SOLN: 4.0000 mg | Freq: Four times a day (QID) | INTRAMUSCULAR | Status: DC | PRN

## 2023-03-19 MED ORDER — LIDOCAINE 2% (20 MG/ML) 5 ML SYRINGE
INTRAMUSCULAR | Status: DC | PRN
  Administered 2023-03-19: 60 mg via INTRAVENOUS

## 2023-03-19 MED ORDER — EPHEDRINE SULFATE-NACL 50-0.9 MG/10ML-% IV SOSY
PREFILLED_SYRINGE | INTRAVENOUS | Status: DC | PRN
  Administered 2023-03-19: 5 mg via INTRAVENOUS

## 2023-03-19 MED ORDER — SURGIFLO WITH THROMBIN (HEMOSTATIC MATRIX KIT) OPTIME
TOPICAL | Status: DC | PRN
Start: 2023-03-19 — End: 2023-03-19
  Administered 2023-03-19: 1 via TOPICAL

## 2023-03-19 MED ORDER — PROPOFOL 10 MG/ML IV BOLUS
INTRAVENOUS | Status: DC | PRN
  Administered 2023-03-19: 100 ug/kg/min via INTRAVENOUS
  Administered 2023-03-19: 150 mg via INTRAVENOUS
  Administered 2023-03-19: 50 mg via INTRAVENOUS

## 2023-03-19 MED ORDER — CHLORHEXIDINE GLUCONATE CLOTH 2 % EX PADS: 6.0000 | MEDICATED_PAD | Freq: Once | CUTANEOUS | Status: DC

## 2023-03-19 MED ORDER — HYDRALAZINE HCL 20 MG/ML IJ SOLN
INTRAMUSCULAR | Status: AC
  Filled 2023-03-19: qty 1

## 2023-03-19 MED ORDER — HEPARIN 6000 UNIT IRRIGATION SOLUTION
Status: AC
  Filled 2023-03-19: qty 500

## 2023-03-19 MED ORDER — ORAL CARE MOUTH RINSE: 15.0000 mL | Freq: Once | OROMUCOSAL | Status: AC

## 2023-03-19 MED ORDER — CEFAZOLIN SODIUM-DEXTROSE 2-4 GM/100ML-% IV SOLN
2.0000 g | INTRAVENOUS | Status: AC
  Administered 2023-03-19: 2 g via INTRAVENOUS
  Filled 2023-03-19: qty 100

## 2023-03-19 MED ORDER — DILTIAZEM HCL ER COATED BEADS 180 MG PO CP24
180.0000 mg | ORAL_CAPSULE | Freq: Every day | ORAL | Status: DC
  Administered 2023-03-19 – 2023-03-20 (×2): 180 mg via ORAL
  Filled 2023-03-19 (×2): qty 1

## 2023-03-19 MED ORDER — SUGAMMADEX SODIUM 200 MG/2ML IV SOLN
INTRAVENOUS | Status: DC | PRN
  Administered 2023-03-19: 250 mg via INTRAVENOUS

## 2023-03-19 MED ORDER — POTASSIUM CHLORIDE CRYS ER 20 MEQ PO TBCR: 20.0000 meq | EXTENDED_RELEASE_TABLET | Freq: Every day | ORAL | Status: DC | PRN

## 2023-03-19 MED ORDER — INSULIN ASPART 100 UNIT/ML IJ SOLN
0.0000 [IU] | Freq: Three times a day (TID) | INTRAMUSCULAR | Status: DC
  Administered 2023-03-20: 3 [IU] via SUBCUTANEOUS
  Administered 2023-03-20: 5 [IU] via SUBCUTANEOUS

## 2023-03-19 MED ORDER — ACETAMINOPHEN 325 MG PO TABS: 325.0000 mg | ORAL_TABLET | ORAL | Status: DC | PRN

## 2023-03-19 MED ORDER — GUAIFENESIN-DM 100-10 MG/5ML PO SYRP: 15.0000 mL | ORAL_SOLUTION | ORAL | Status: DC | PRN

## 2023-03-19 MED ORDER — PROTAMINE SULFATE 10 MG/ML IV SOLN
INTRAVENOUS | Status: DC | PRN
Start: 2023-03-19 — End: 2023-03-19
  Administered 2023-03-19: 50 mg via INTRAVENOUS

## 2023-03-19 MED ORDER — LABETALOL HCL 5 MG/ML IV SOLN: 10.0000 mg | INTRAVENOUS | Status: DC | PRN

## 2023-03-19 MED ORDER — CHLORHEXIDINE GLUCONATE 0.12 % MT SOLN
15.0000 mL | Freq: Once | OROMUCOSAL | Status: AC
  Administered 2023-03-19: 15 mL via OROMUCOSAL
  Filled 2023-03-19: qty 15

## 2023-03-19 MED ORDER — INFLUENZA VAC A&B SURF ANT ADJ 0.5 ML IM SUSY
0.5000 mL | PREFILLED_SYRINGE | INTRAMUSCULAR | Status: DC
  Filled 2023-03-19: qty 0.5

## 2023-03-19 MED ORDER — OXYCODONE HCL 5 MG/5ML PO SOLN: 5.0000 mg | Freq: Once | ORAL | Status: AC | PRN

## 2023-03-19 MED ORDER — ALUM & MAG HYDROXIDE-SIMETH 200-200-20 MG/5ML PO SUSP: 15.0000 mL | ORAL | Status: DC | PRN

## 2023-03-19 MED ORDER — PROPOFOL 10 MG/ML IV BOLUS
INTRAVENOUS | Status: AC
  Filled 2023-03-19: qty 20

## 2023-03-19 MED ORDER — METOPROLOL SUCCINATE ER 100 MG PO TB24
100.0000 mg | ORAL_TABLET | Freq: Every day | ORAL | Status: DC
  Administered 2023-03-20: 100 mg via ORAL
  Filled 2023-03-19: qty 1

## 2023-03-19 MED ORDER — HYDRALAZINE HCL 20 MG/ML IJ SOLN: 5.0000 mg | INTRAMUSCULAR | Status: DC | PRN

## 2023-03-19 MED ORDER — ONDANSETRON HCL 4 MG/2ML IJ SOLN
INTRAMUSCULAR | Status: AC
  Filled 2023-03-19: qty 2

## 2023-03-19 MED ORDER — MAGNESIUM SULFATE 2 GM/50ML IV SOLN: 2.0000 g | Freq: Every day | INTRAVENOUS | Status: DC | PRN

## 2023-03-19 MED ORDER — OXYCODONE-ACETAMINOPHEN 5-325 MG PO TABS
1.0000 | ORAL_TABLET | ORAL | Status: DC | PRN
  Administered 2023-03-19: 1 via ORAL
  Filled 2023-03-19: qty 1

## 2023-03-19 MED ORDER — LIDOCAINE HCL (PF) 1 % IJ SOLN
INTRAMUSCULAR | Status: AC
  Filled 2023-03-19: qty 5

## 2023-03-19 MED ORDER — EZETIMIBE 10 MG PO TABS
10.0000 mg | ORAL_TABLET | Freq: Every day | ORAL | Status: DC
  Administered 2023-03-19 – 2023-03-20 (×2): 10 mg via ORAL
  Filled 2023-03-19 (×2): qty 1

## 2023-03-19 MED ORDER — ROCURONIUM BROMIDE 10 MG/ML (PF) SYRINGE
PREFILLED_SYRINGE | INTRAVENOUS | Status: DC | PRN
  Administered 2023-03-19: 20 mg via INTRAVENOUS
  Administered 2023-03-19: 50 mg via INTRAVENOUS

## 2023-03-19 MED ORDER — ASPIRIN 81 MG PO TBEC
81.0000 mg | DELAYED_RELEASE_TABLET | Freq: Every day | ORAL | Status: DC
  Administered 2023-03-19 – 2023-03-20 (×2): 81 mg via ORAL
  Filled 2023-03-19 (×3): qty 1

## 2023-03-19 MED ORDER — INSULIN ASPART 100 UNIT/ML IJ SOLN
0.0000 [IU] | INTRAMUSCULAR | Status: DC | PRN
  Administered 2023-03-19: 3 [IU] via SUBCUTANEOUS
  Filled 2023-03-19: qty 1

## 2023-03-19 MED ORDER — GLIPIZIDE 5 MG PO TABS
5.0000 mg | ORAL_TABLET | Freq: Every day | ORAL | Status: DC
  Administered 2023-03-20: 5 mg via ORAL
  Filled 2023-03-19: qty 1

## 2023-03-19 MED ORDER — CLOPIDOGREL BISULFATE 75 MG PO TABS
75.0000 mg | ORAL_TABLET | Freq: Every day | ORAL | Status: DC
  Administered 2023-03-19 – 2023-03-20 (×2): 75 mg via ORAL
  Filled 2023-03-19 (×3): qty 1

## 2023-03-19 MED ORDER — OXYCODONE HCL 5 MG PO TABS
ORAL_TABLET | ORAL | Status: AC
  Filled 2023-03-19: qty 1

## 2023-03-19 MED ORDER — DEXAMETHASONE SODIUM PHOSPHATE 10 MG/ML IJ SOLN
INTRAMUSCULAR | Status: AC
  Filled 2023-03-19: qty 1

## 2023-03-19 MED ORDER — FENTANYL CITRATE (PF) 100 MCG/2ML IJ SOLN: 25.0000 ug | INTRAMUSCULAR | Status: DC | PRN

## 2023-03-19 MED ORDER — CEFAZOLIN SODIUM-DEXTROSE 2-4 GM/100ML-% IV SOLN
2.0000 g | Freq: Three times a day (TID) | INTRAVENOUS | Status: AC
  Administered 2023-03-19 – 2023-03-20 (×2): 2 g via INTRAVENOUS
  Filled 2023-03-19 (×2): qty 100

## 2023-03-19 MED ORDER — PHENYLEPHRINE 80 MCG/ML (10ML) SYRINGE FOR IV PUSH (FOR BLOOD PRESSURE SUPPORT)
PREFILLED_SYRINGE | INTRAVENOUS | Status: DC | PRN
  Administered 2023-03-19: 160 ug via INTRAVENOUS

## 2023-03-19 MED ORDER — HEPARIN 6000 UNIT IRRIGATION SOLUTION
Status: DC | PRN
  Administered 2023-03-19: 1

## 2023-03-19 MED ORDER — DOCUSATE SODIUM 100 MG PO CAPS
100.0000 mg | ORAL_CAPSULE | Freq: Every day | ORAL | Status: DC
  Administered 2023-03-20: 100 mg via ORAL
  Filled 2023-03-19: qty 1

## 2023-03-19 MED ORDER — ROCURONIUM BROMIDE 10 MG/ML (PF) SYRINGE
PREFILLED_SYRINGE | INTRAVENOUS | Status: AC
  Filled 2023-03-19: qty 10

## 2023-03-19 MED ORDER — IODIXANOL 320 MG/ML IV SOLN
INTRAVENOUS | Status: DC | PRN
  Administered 2023-03-19: 30 mL via INTRA_ARTERIAL

## 2023-03-19 MED ORDER — PROPOFOL 1000 MG/100ML IV EMUL
INTRAVENOUS | Status: AC
  Filled 2023-03-19: qty 100

## 2023-03-19 MED ORDER — LIDOCAINE 2% (20 MG/ML) 5 ML SYRINGE
INTRAMUSCULAR | Status: AC
  Filled 2023-03-19: qty 5

## 2023-03-19 MED ORDER — PRAVASTATIN SODIUM 40 MG PO TABS
40.0000 mg | ORAL_TABLET | Freq: Every day | ORAL | Status: DC
  Administered 2023-03-19: 40 mg via ORAL
  Filled 2023-03-19: qty 1

## 2023-03-19 MED ORDER — MORPHINE SULFATE (PF) 2 MG/ML IV SOLN: 2.0000 mg | INTRAVENOUS | Status: DC | PRN

## 2023-03-19 MED ORDER — GLYCOPYRROLATE PF 0.2 MG/ML IJ SOSY
PREFILLED_SYRINGE | INTRAMUSCULAR | Status: DC | PRN
Start: 2023-03-19 — End: 2023-03-19
  Administered 2023-03-19: .1 mg via INTRAVENOUS
  Administered 2023-03-19: .2 mg via INTRAVENOUS

## 2023-03-19 MED ORDER — SODIUM CHLORIDE 0.9 % IV SOLN: INTRAVENOUS | Status: AC

## 2023-03-19 SURGICAL SUPPLY — 50 items
ADH SKN CLS APL DERMABOND .7 (GAUZE/BANDAGES/DRESSINGS) ×2
AGENT HMST KT MTR STRL THRMB (HEMOSTASIS) ×2
BAG BANDED W/RUBBER/TAPE 36X54 (MISCELLANEOUS) ×2 IMPLANT
BAG COUNTER SPONGE SURGICOUNT (BAG) ×2 IMPLANT
BAG EQP BAND 135X91 W/RBR TAPE (MISCELLANEOUS) ×2
BAG SPNG CNTER NS LX DISP (BAG) ×2
CANISTER SUCT 3000ML PPV (MISCELLANEOUS) ×2 IMPLANT
CATH SHOCKWAVE C2 4.0X12 (CATHETERS) IMPLANT
CATH SUCT 10FR WHISTLE TIP (CATHETERS) ×2 IMPLANT
CLIP TI MEDIUM 6 (CLIP) ×2 IMPLANT
CLIP TI WIDE RED SMALL 6 (CLIP) ×2 IMPLANT
COVER DOME SNAP 22 D (MISCELLANEOUS) ×2 IMPLANT
COVER PROBE W GEL 5X96 (DRAPES) ×2 IMPLANT
DERMABOND ADVANCED .7 DNX12 (GAUZE/BANDAGES/DRESSINGS) ×2 IMPLANT
DRAPE FEMORAL ANGIO 80X135IN (DRAPES) ×2 IMPLANT
ELECT REM PT RETURN 9FT ADLT (ELECTROSURGICAL) ×2
ELECTRODE REM PT RTRN 9FT ADLT (ELECTROSURGICAL) ×2 IMPLANT
GLOVE SURG SS PI 7.5 STRL IVOR (GLOVE) ×6 IMPLANT
GOWN STRL REUS W/ TWL LRG LVL3 (GOWN DISPOSABLE) ×4 IMPLANT
GOWN STRL REUS W/ TWL XL LVL3 (GOWN DISPOSABLE) ×2 IMPLANT
GOWN STRL REUS W/TWL LRG LVL3 (GOWN DISPOSABLE) ×4
GOWN STRL REUS W/TWL XL LVL3 (GOWN DISPOSABLE) ×2
GUIDEWIRE ENROUTE 0.014 (WIRE) ×2 IMPLANT
HEMOSTAT SNOW SURGICEL 2X4 (HEMOSTASIS) IMPLANT
KIT BASIN OR (CUSTOM PROCEDURE TRAY) ×2 IMPLANT
KIT ENCORE 26 ADVANTAGE (KITS) ×2 IMPLANT
KIT INTRODUCER GALT 7 (INTRODUCER) ×2 IMPLANT
KIT TURNOVER KIT B (KITS) ×2 IMPLANT
NDL HYPO 25GX1X1/2 BEV (NEEDLE) IMPLANT
NEEDLE HYPO 25GX1X1/2 BEV (NEEDLE) IMPLANT
PACK CAROTID (CUSTOM PROCEDURE TRAY) ×2 IMPLANT
POSITIONER HEAD DONUT 9IN (MISCELLANEOUS) ×2 IMPLANT
SET MICROPUNCTURE 5F STIFF (MISCELLANEOUS) ×2 IMPLANT
STENT TRANSCAROTID SYSTEM 9X40 (Permanent Stent) IMPLANT
SURGIFLO W/THROMBIN 8M KIT (HEMOSTASIS) IMPLANT
SUT PROLENE 5 0 C 1 24 (SUTURE) ×4 IMPLANT
SUT SILK 2 0 PERMA HAND 18 BK (SUTURE) ×2 IMPLANT
SUT SILK 2 0 SH (SUTURE) ×2 IMPLANT
SUT VIC AB 3-0 SH 27 (SUTURE) ×2
SUT VIC AB 3-0 SH 27X BRD (SUTURE) ×4 IMPLANT
SUT VIC AB 4-0 PS2 27 (SUTURE) ×2 IMPLANT
SYR 10ML LL (SYRINGE) ×6 IMPLANT
SYR 20ML LL LF (SYRINGE) ×2 IMPLANT
SYR CONTROL 10ML LL (SYRINGE) IMPLANT
SYSTEM TRANSCAROTID NEUROPRTCT (MISCELLANEOUS) ×2 IMPLANT
TOWEL GREEN STERILE (TOWEL DISPOSABLE) ×2 IMPLANT
TRANSCAROTID NEUROPROTECT SYS (MISCELLANEOUS) ×2
WATER STERILE IRR 1000ML POUR (IV SOLUTION) ×2 IMPLANT
WIRE BENTSON .035X145CM (WIRE) ×2 IMPLANT
WIRE SPARTACORE .014X190CM (WIRE) IMPLANT

## 2023-03-19 NOTE — Transfer of Care (Signed)
Immediate Anesthesia Transfer of Care Note  Patient: Emily [name]  Procedure(s) Performed: Right Transcarotid Artery Revascularization (Right: Neck) INTRAVASCULAR LITHOTRIPSY PROCEDURE (Right: Neck) ULTRASOUND GUIDANCE FOR VASCULAR ACCESS (Left: Groin)  Patient Location: PACU  Anesthesia Type:General  Level of Consciousness: awake, alert , and oriented  Airway & Oxygen Therapy: Patient connected to nasal cannula oxygen  Post-op Assessment: Report given to RN and Post -op Vital signs reviewed and stable  Post vital signs: Reviewed and stable  Last Vitals:  Vitals Value Taken Time  BP 114/49 03/19/23 1124  Temp    Pulse 55 03/19/23 1128  Resp 0 03/19/23 1128  SpO2 95 % 03/19/23 1128  Vitals shown include unfiled device data.  Last Pain:  Vitals:   03/19/23 0907  TempSrc:   PainSc: 0-No pain         Complications: No notable events documented.

## 2023-03-19 NOTE — Plan of Care (Signed)
[facility] Tonsillectomy/Adenoidectomy, Postoperative PEDS care plan entered in error.

## 2023-03-19 NOTE — Anesthesia Procedure Notes (Signed)
Procedure Name: Intubation Date/Time: 03/19/2023 9:45 AM  Performed by: Sharyn Dross, CRNAPre-anesthesia Checklist: Patient identified, Emergency Drugs available, Suction available and Patient being monitored Patient Re-evaluated:Patient Re-evaluated prior to induction Oxygen Delivery Method: Circle system utilized Preoxygenation: Pre-oxygenation with 100% oxygen Induction Type: IV induction Ventilation: Mask ventilation without difficulty Laryngoscope Size: Glidescope and 4 Grade View: Grade I Tube type: Oral Tube size: 7.0 mm Number of attempts: 1 Airway Equipment and Method: Stylet and Oral airway Placement Confirmation: ETT inserted through vocal cords under direct vision, positive ETCO2 and breath sounds checked- equal and bilateral Secured at: 22 cm Tube secured with: Tape Dental Injury: Teeth and Oropharynx as per pre-operative assessment

## 2023-03-19 NOTE — H&P (Signed)
[facility]   Patient name: Emily [name]       MRN: [id number]        DOB: 08/27/1950          Sex: female     REASON FOR VISIT:      Follow up   HISOTRY OF PRESENT ILLNESS:      Emily [name] is a 72 y.o. female who returns for follow-up.  She has a history of left carotid arterectomy in 2017 for asymptomatic stenosis.  Her most recent ultrasound suggested greater than 80% stenosis on the right.  She was sent for CT scan and is back today to discuss these results.  She denies any symptoms of numbness or tingling in either extremity.  She denies slurred speech.  She denies amaurosis fugax.   The patient suffers from atrial fibrillation.  She is a diabetic.  She takes a statin for hypercholesterolemia.  She is on an ARB for hypertension     PAST MEDICAL HISTORY:        Past Medical History:  Diagnosis Date   Anemia     Arthritis     Atrial fibrillation with RVR (HCC) 08/30/2020   Atrial fibrillation, rapid (HCC) 10/27/2013   Bilateral carotid artery disease (HCC)     Diabetes mellitus without complication (HCC)     Difficult intubation      states 'lady that did the sleep study told me I have the smallest airway she has ever seen in an adult"   Dyslipidemia 06/29/2017   Dysrhythmia     Hypertension     Hypothyroid     Obesity     Obstructive sleep apnea      on C Pap   PAF (paroxysmal atrial fibrillation) (HCC)      a. newly dx in 09/2013; on eliquis   Upper GI bleed 09/25/2019            FAMILY HISTORY:         Family History  Problem Relation Age of Onset   COPD Father     Heart failure Father     Heart disease Father     Emphysema Father     Arrhythmia Sister     Arrhythmia Sister     Arrhythmia Sister          had PPM also   Cancer Sister            SOCIAL HISTORY:    Social History         Tobacco Use   Smoking status: Never      Passive exposure: Never   Smokeless tobacco: Never   Substance Use Topics   Alcohol use: No      Alcohol/week: 0.0 standard drinks of alcohol        ALLERGIES:    Allergies       Allergies  Allergen Reactions   Jardiance [Empagliflozin] Other (See Comments)      RECURRENT VAGINITIS   Quinapril Hcl Cough   Statins Other (See Comments)      Not all Statins but some cause cough and pain in legs.   Tape Other (See Comments)      Redness, please use "paper" tape          CURRENT MEDICATIONS:          Current Outpatient Medications  Medication Sig Dispense Refill   aspirin EC 81 MG tablet Take 81 mg by mouth daily.  bisacodyl (DULCOLAX) 5 MG EC tablet Take 5 mg by mouth daily.       diltiazem (CARDIZEM CD) 180 MG 24 hr capsule TAKE 1 CAPSULE BY MOUTH EVERY DAY 90 capsule 2   diltiazem (CARDIZEM) 30 MG tablet Take 30 mg by mouth as needed.       ezetimibe (ZETIA) 10 MG tablet TAKE 1 TABLET BY MOUTH EVERY DAY 90 tablet 3   flecainide (TAMBOCOR) 100 MG tablet TAKE 1 TABLET BY MOUTH TWICE A DAY 180 tablet 3   furosemide (LASIX) 20 MG tablet TAKE 1 TABLET (20 MG TOTAL) BY MOUTH DAILY AS NEEDED FOR FLUID. 90 tablet 0   gabapentin (NEURONTIN) 300 MG capsule Take 1 capsule (300 mg total) by mouth 2 (two) times daily as needed (neuropathy right leg). 180 capsule 2   glipiZIDE (GLUCOTROL) 5 MG tablet TAKE 1 TABLET BY MOUTH TWICE A DAY 180 tablet 0   HYDROcodone-acetaminophen (NORCO) 5-325 MG tablet Take 1 tablet by mouth every 6 (six) hours as needed for severe pain. 20 tablet 0   ketoconazole (NIZORAL) 2 % cream Apply 1 application topically daily. Prn intertrigo rash 60 g 1   levothyroxine (SYNTHROID) 112 MCG tablet TAKE 1 TABLET BY MOUTH EVERY OTHER DAY, ALTERNATING WITH 1 & 1/2 TABLETS EVERY OTHER DAY 112 tablet 3   linaclotide (LINZESS) 72 MCG capsule Take 72 mcg by mouth daily before breakfast.       losartan (COZAAR) 25 MG tablet TAKE 1 TABLET (25 MG TOTAL) BY MOUTH DAILY. 90 tablet 3   lovastatin (MEVACOR) 40 MG tablet TAKE 1  TABLET BY MOUTH EVERY DAY IN THE EVENING 90 tablet 3   metFORMIN (GLUCOPHAGE) 1000 MG tablet TAKE 1 TABLET (1,000 MG TOTAL) BY MOUTH TWICE A DAY WITH FOOD 180 tablet 0   methocarbamol (ROBAXIN) 500 MG tablet Take 1 tablet (500 mg total) by mouth every 8 (eight) hours as needed for muscle spasms. 30 tablet 0   metoprolol succinate (TOPROL-XL) 100 MG 24 hr tablet TAKE 1 TABLET BY MOUTH EVERY DAY WITH OR IMMEDIATELY FOLLOWING A MEAL 90 tablet 0   rOPINIRole (REQUIP) 0.5 MG tablet TAKE 1 TABLET BY MOUTH EVERYDAY AT BEDTIME 90 tablet 0   Semaglutide,0.25 or 0.5MG /DOS, 2 MG/3ML SOPN Inject 0.5 mg into the skin once a week. 3 mL     triamcinolone cream (KENALOG) 0.1 % Apply 1 Application topically 2 (two) times daily. 80 g 0               Current Facility-Administered Medications  Medication Dose Route Frequency Provider Last Rate Last Admin   cyanocobalamin ((VITAMIN B-12)) injection 1,000 mcg  1,000 mcg Intramuscular Q30 days Elenora Gamma, MD   1,000 mcg at 01/05/23 1025        REVIEW OF SYSTEMS:    [X]  denotes positive finding, [ ]  denotes negative finding Cardiac   Comments:  Chest pain or chest pressure:      Shortness of breath upon exertion:      Short of breath when lying flat:      Irregular heart rhythm:             Vascular      Pain in calf, thigh, or hip brought on by ambulation:      Pain in feet at night that wakes you up from your sleep:       Blood clot in your veins:      Leg swelling:  Pulmonary      Oxygen at home:      Productive cough:       Wheezing:              Neurologic      Sudden weakness in arms or legs:       Sudden numbness in arms or legs:       Sudden onset of difficulty speaking or slurred speech:      Temporary loss of vision in one eye:       Problems with dizziness:              Gastrointestinal      Blood in stool:       Vomited blood:              Genitourinary      Burning when urinating:       Blood in urine:              Psychiatric      Major depression:              Hematologic      Bleeding problems:      Problems with blood clotting too easily:             Skin      Rashes or ulcers:             Constitutional      Fever or chills:          PHYSICAL EXAM:        Vitals:    01/25/23 0846 01/25/23 0848  BP: 116/70 (!) 144/60  Pulse: (!) 59    Resp: 20    Temp: 97.9 F (36.6 C)    SpO2: 97%    Weight: 220 lb (99.8 kg)    Height: 5\' 3"  (1.6 m)        GENERAL: The patient is a well-nourished female, in no acute distress. The vital signs are documented above. CARDIAC: There is a regular rate and rhythm.  PULMONARY: Non-labored respirations MUSCULOSKELETAL: There are no major deformities or cyanosis. NEUROLOGIC: No focal weakness or paresthesias are detected. SKIN: There are no ulcers or rashes noted. PSYCHIATRIC: The patient has a normal affect.   STUDIES:    I have reviewed her CT scan with the following findings: 1. Left carotid endarterectomy without significant stenosis. 2. Dense calcifications at the right carotid bifurcation with at least 80% stenosis of the proximal right internal carotid artery. 3. 50-60% stenosis of the proximal right vertebral artery, the dominant vessel. 4. 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 5. Faint opacification of the left V2 segment at the C4 level. A hypoplastic vessel is again occluded at the dural margin. There is some opacification of the distal V4 segment proximal to the vertebrobasilar junction. 6. Degenerative changes of the cervical spine, most evident at C6-7, left greater than right. 7. Patchy ground-glass attenuation at the lung apices bilaterally. This is nonspecific, but most likely represents atelectasis or edema. 8.  Aortic Atherosclerosis (ICD10-I70.0).   MEDICAL ISSUES:    Asymptomatic right stenosis: By CT scan and ultrasound, her stenosis on the right side is greater than 80%.  Because of the degree of  stenosis being what it is, I have recommended that we proceed with intervention.  I talked to her about options which include continued medical management which would be associated with a higher risk of stroke.  We also talked about surgical correction by  endarterectomy or stenting.  She has a lesion that goes up to the mid C2, well above the angle of the mandible.  I think that this surgically will be challenging and brings in the risk of glossopharyngeal nerve issues.  Because of her anatomy, I think that she would be better treated with stenting.  TCAR has been shown to have a lower stroke rate then transfemoral stenting and so that is what I have recommended.  I discussed the details of the procedure as well as the risks and benefits including the risk of bleeding and stroke.  She does have a cardiac history that she sees Dr. Antoine Poche for.  I will get clearance from him and then we will schedule her for a right sided TCAR.  I am starting her on Plavix today.  She is already on aspirin and a statin.       Charlena Cross, MD, FACS [facility] [phone] Pager [phone]

## 2023-03-19 NOTE — Op Note (Signed)
Patient name: Emily [name] MRN: [id number] DOB: [birth year] Sex: female  03/19/2023 Pre-operative Diagnosis: Asymptomatic right carotid stenosis Post-operative diagnosis:  Same Surgeon:  Durene Cal Assistants:  Clotilde Dieter, MD, Aggie Moats, [place] Procedure:   #1: Right carotid stent (TCAR)   #2: Retrograde flow reversal neuroprotection    #3: Ultrasound-guided access, left femoral vein   #4: Shockwave intra-arterial lithotripsy, right common and internal carotid artery Anesthesia:  general Blood Loss:  minimal Specimens:  none  Findings: Calcified bifurcation treated with a 4 mm shockwave balloon followed by stenting with no residual stenosis  Predilation balloon: 4 x 12 shockwave, 5 x 35 Stent: 9 x 40 Flow reversal time: 17 minutes Procedure time: 60 minutes Contrast: 30 cc Fluoroscopy: 5.1 minutes Dose area: 2449  Indications: This is a 72 year old female with history of left carotid endarterectomy who has developed progressive stenosis of her right carotid artery which is asymptomatic.  Her lesion was heavily calcified and high, and so stenting was recommended.  Procedure:  The patient was identified in the holding area and taken to [facility] ROOM 16  The patient was then placed supine on the table. general anesthesia was administered.  The patient was prepped and draped in the usual sterile fashion.  A time out was called and antibiotics were administered.  An assistant was necessary to expedite the procedure and assist with technical details.  They help with placement of the left femoral sheath, exposure of the carotid artery, wire manipulation and catheter exchanges and closure.  Ultrasound was used to evaluate the left common femoral vein which was widely patent and easily compressible.  It was cannulated under ultrasound guidance with a micropuncture needle.  A [place] wire was inserted followed by placement of micropuncture sheath.  Next over an 035 wire, the TCAR sheath was  placed.  A transverse incision was made just above the clavicle.  Cautery was used divide subcutaneous tissue down to the platysma which was divided with cautery.  An avascular plane was then developed between the 2 heads of the sternocleidomastoid.  The common carotid artery was then exposed.  The vagus nerve was visualized and protected.  The common carotid artery was disease-free.  It was encircled with a vessel loop and umbilical tape.  The patient was then fully heparinized.  A 5-0 Prolene U-stitch was placed in the adventitia of the right common carotid artery.  The common carotid artery was then cannulated under ultrasound guidance with a micropuncture needle.  A 018 wire was then inserted followed by placement of a micropuncture sheath up to 3 cm.  A contrast injection was then performed locating the carotid bifurcation.  This was marked on the screen.  A carotid Amplatz wire was then inserted up to the mark and the TCAR sheath was placed.  Flow reversal tubing was then connected.  Passive flow reversal was confirmed with a saline flush.  This TCAR sheath was sutured into position with silk.  Next, a TCAR timeout was called.  Active flow reversal was initiated by securing the Potts vessel loop on the proximal common carotid artery.  Flow reversal was confirmed with a saline flush.  Next, a 018 wire and a 4 x 12 shockwave balloon were advanced into the sheath.  A contrast injection was performed locating the lesion.  The [place] core wire was then advanced through the stenosis.  I then treated the calcified stenosis using 40 pulses of the balloon in the distal internal carotid artery and 40 across  the bifurcation.  Next, a 5 x 35 balloon was inflated to nominal pressure for 20 seconds.  A 9 x 40 stent was then deployed.  After 2 minutes, completion imaging showed a widely patent carotid artery and 2 separate obliquities.  The wire was then removed.  Flow reversal was discontinued blood flow in the tubing was  returned to the patient via the groin access.  The TCAR sheath was then removed and the arteriotomy closed by securing the Prolene stitch already placed.  Hand-held Doppler revealed excellent signals in the common carotid artery.  The patient's heparin was reversed with 50 mg of protamine.  The groin sheath was removed and manual pressure was held.  Once the neck incision was irrigated and hemostasis was achieved, the platysma muscle was reapproximated with 3-0 Vicryl and skin was closed with 4-0 Vicryl followed by Dermabond.  The patient successfully extubated and taken to recovery in stable condition.  There were no immediate complications.   Disposition: To PACU stable.   Juleen China, M.D., [facility] [place] Office: [phone] Pager:  [phone]

## 2023-03-19 NOTE — Anesthesia Procedure Notes (Signed)
Arterial Line Insertion Start/End10/18/2024 9:05 AM, 03/19/2023 9:10 AM Performed by: Randon Goldsmith, CRNA, CRNA  Patient location: Pre-op. Preanesthetic checklist: patient identified, IV checked, site marked, risks and benefits discussed, surgical consent, monitors and equipment checked, pre-op evaluation, timeout performed and anesthesia consent Lidocaine 1% used for infiltration Left, radial was placed Catheter size: 20 G Hand hygiene performed , maximum sterile barriers used  and Seldinger technique used Allen's test indicative of satisfactory collateral circulation Attempts: 1 Procedure performed without using ultrasound guided technique. Following insertion, dressing applied. Post procedure assessment: normal and unchanged  Patient tolerated the procedure well with no immediate complications.

## 2023-03-20 ENCOUNTER — Other Ambulatory Visit (HOSPITAL_COMMUNITY): Payer: Self-pay

## 2023-03-20 ENCOUNTER — Encounter (HOSPITAL_COMMUNITY): Payer: Self-pay | Admitting: Hematology

## 2023-03-20 LAB — GLUCOSE, CAPILLARY
Glucose-Capillary: 207 mg/dL — ABNORMAL HIGH (ref 70–99)
Glucose-Capillary: 181 mg/dL — ABNORMAL HIGH (ref 70–99)

## 2023-03-20 LAB — BASIC METABOLIC PANEL
Anion gap: 11 (ref 5–15)
BUN: 24 mg/dL — ABNORMAL HIGH (ref 8–23)
CO2: 22 mmol/L (ref 22–32)
Calcium: 8.5 mg/dL — ABNORMAL LOW (ref 8.9–10.3)
Chloride: 101 mmol/L (ref 98–111)
Creatinine, Ser: 0.84 mg/dL (ref 0.44–1.00)
GFR, Estimated: >60 mL/min (ref >60)
Glucose, Bld: 203 mg/dL — ABNORMAL HIGH (ref 70–99)
Potassium: 4.3 mmol/L (ref 3.5–5.1)
Sodium: 134 mmol/L — ABNORMAL LOW (ref 135–145)

## 2023-03-20 LAB — CBC
HCT: 31.7 % — ABNORMAL LOW (ref 36.0–46.0)
Hemoglobin: 9.9 g/dL — ABNORMAL LOW (ref 12.0–15.0)
MCH: 28.4 pg (ref 26.0–34.0)
MCHC: 31.2 g/dL (ref 30.0–36.0)
MCV: 90.8 fL (ref 80.0–100.0)
Platelets: 258 10*3/uL (ref 150–400)
RBC: 3.49 MIL/uL — ABNORMAL LOW (ref 3.87–5.11)
RDW: 16.4 % — ABNORMAL HIGH (ref 11.5–15.5)
WBC: 13.7 10*3/uL — ABNORMAL HIGH (ref 4.0–10.5)
nRBC: 0 % (ref 0.0–0.2)

## 2023-03-20 LAB — POCT ACTIVATED CLOTTING TIME: Activated Clotting Time: 311 s

## 2023-03-20 MED ORDER — HYDROCODONE-ACETAMINOPHEN 5-325 MG PO TABS
1.0000 | ORAL_TABLET | Freq: Four times a day (QID) | ORAL | 0 refills | Status: DC | PRN
  Filled 2023-03-20: qty 10, 3d supply, fill #0

## 2023-03-20 NOTE — Progress Notes (Addendum)
  Progress Note    03/20/2023 9:19 AM 1 Day Post-Op  Subjective:  no complaints.  Denies stroke like symptoms overnight   Vitals:   03/20/23 0600 03/20/23 0713  BP:  (!) 121/52  Pulse: (!) 56 60  Resp: 14 19  Temp:  97.8 F (36.6 C)  SpO2:  93%   Physical Exam: Lungs:  non labored Incisions:  R neck c/d/I; L groin without hematoma Extremities:  moving all ext well Neurologic: CN grossly intact  CBC    Component Value Date/Time   WBC 13.7 (H) 03/20/2023 0500   RBC 3.49 (L) 03/20/2023 0500   HGB 9.9 (L) 03/20/2023 0500   HGB 12.5 06/09/2022 0935   HCT 31.7 (L) 03/20/2023 0500   HCT 38.3 06/09/2022 0935   PLT 258 03/20/2023 0500   PLT 250 06/09/2022 0935   MCV 90.8 03/20/2023 0500   MCV 92 06/09/2022 0935   MCH 28.4 03/20/2023 0500   MCHC 31.2 03/20/2023 0500   RDW 16.4 (H) 03/20/2023 0500   RDW 13.9 06/09/2022 0935   LYMPHSABS 0.2 (L) 08/30/2020 0534   LYMPHSABS 1.4 07/27/2017 1418   MONOABS 0.2 08/30/2020 0534   EOSABS 0.0 08/30/2020 0534   EOSABS 0.2 07/27/2017 1418   BASOSABS 0.0 08/30/2020 0534   BASOSABS 0.0 07/27/2017 1418    BMET    Component Value Date/Time   NA 134 (L) 03/20/2023 0500   NA 142 06/09/2022 0935   K 4.3 03/20/2023 0500   CL 101 03/20/2023 0500   CO2 22 03/20/2023 0500   GLUCOSE 203 (H) 03/20/2023 0500   BUN 24 (H) 03/20/2023 0500   BUN 24 06/09/2022 0935   CREATININE 0.84 03/20/2023 0500   CALCIUM 8.5 (L) 03/20/2023 0500   GFRNONAA >60 03/20/2023 0500   GFRAA >60 01/16/2020 0847    INR    Component Value Date/Time   INR 1.0 03/16/2023 1224     Intake/Output Summary (Last 24 hours) at 03/20/2023 0919 Last data filed at 03/20/2023 0805 Gross per 24 hour  Intake 1257.54 ml  Output 400 ml  Net 857.54 ml     Assessment/Plan:  72 y.o. female is s/p right TCAR with shockwave lithotripsy 1 Day Post-Op   No neuro events overnight or this morning.  Patient is ambulating well and feeling ready for discharge home The  right neck incision is well-appearing.  Left groin, femoral vein cath site without hematoma Continue aspirin, Plavix, statin daily Discharge home today.  Office will arrange carotid duplex in 3 to 4 weeks   Emilie Rutter, PA-C [facility] [phone] 03/20/2023 9:19 AM   I have seen and evaluated the patient. I agree with the PA note as documented above.  Status post right TCAR with lithotripsy yesterday.  Incisions look fine.  At her neurologic baseline.  Plan discharge today on aspirin statin Plavix.  Will arrange follow-up in 4 weeks with carotid duplex.  Cephus Shelling, MD [facility] of [place] Office: [phone]

## 2023-03-20 NOTE — Progress Notes (Signed)
D/c tele and IV. Went over AVS with pt and all questions were addressed.   Lavenia Atlas, RN

## 2023-03-20 NOTE — Discharge Instructions (Signed)
   [facility] [place]  Discharge Instructions   Carotid Endarterectomy (CEA)  Please refer to the following instructions for your post-procedure care. Your surgeon or physician assistant will discuss any changes with you.  Activity  You are encouraged to walk as much as you can. You can slowly return to normal activities but must avoid strenuous activity and heavy lifting until your doctor tell you it's OK. Avoid activities such as vacuuming or swinging a golf club. You can drive after one week if you are comfortable and you are no longer taking prescription pain medications. It is normal to feel tired for serval weeks after your surgery. It is also normal to have difficulty with sleep habits, eating, and bowel movements after surgery. These will go away with time.  Bathing/Showering  You may shower after you come home. Do not soak in a bathtub, hot tub, or swim until the incision heals completely.  Incision Care  Shower every day. Clean your incision with mild soap and water. Pat the area dry with a clean towel. You do not need a bandage unless otherwise instructed. Do not apply any ointments or creams to your incision. You may have skin glue on your incision. Do not peel it off. It will come off on its own in about one week. Your incision may feel thickened and raised for several weeks after your surgery. This is normal and the skin will soften over time. For Men Only: It's OK to shave around the incision but do not shave the incision itself for 2 weeks. It is common to have numbness under your chin that could last for several months.  Diet  Resume your normal diet. There are no special food restrictions following this procedure. A low fat/low cholesterol diet is recommended for all patients with vascular disease. In order to heal from your surgery, it is CRITICAL to get adequate nutrition. Your body requires vitamins, minerals, and protein. Vegetables are the best  source of vitamins and minerals. Vegetables also provide the perfect balance of protein. Processed food has little nutritional value, so try to avoid this.        Medications  Resume taking all of your medications unless your doctor or physician assistant tells you not to. If your incision is causing pain, you may take over-the- counter pain relievers such as acetaminophen (Tylenol). If you were prescribed a stronger pain medication, please be aware these medications can cause nausea and constipation. Prevent nausea by taking the medication with a snack or meal. Avoid constipation by drinking plenty of fluids and eating foods with a high amount of fiber, such as fruits, vegetables, and grains. Do not take Tylenol if you are taking prescription pain medications.  Follow Up  Our office will schedule a follow up appointment 2-3 weeks following discharge.  Please call us immediately for any of the following conditions  Increased pain, redness, drainage (pus) from your incision site. Fever of 101 degrees or higher. If you should develop stroke (slurred speech, difficulty swallowing, weakness on one side of your body, loss of vision) you should call 911 and go to the nearest emergency room.  Reduce your risk of vascular disease:  Stop smoking. If you would like help call [facility] at 1-800-QUIT-NOW (1-800-784-8669) or [facility] at 336-586-4000. Manage your cholesterol Maintain a desired weight Control your diabetes Keep your blood pressure down  If you have any questions, please call the office at [phone].   

## 2023-03-20 NOTE — Anesthesia Postprocedure Evaluation (Signed)
Anesthesia Post Note  Patient: Onna Maldonado  Procedure(s) Performed: Right Transcarotid Artery Revascularization (Right: Neck) INTRAVASCULAR LITHOTRIPSY PROCEDURE (Right: Neck) ULTRASOUND GUIDANCE FOR VASCULAR ACCESS (Left: Groin)     Patient location during evaluation: PACU Anesthesia Type: General Level of consciousness: awake and alert Pain management: pain level controlled Vital Signs Assessment: post-procedure vital signs reviewed and stable Respiratory status: spontaneous breathing, nonlabored ventilation, respiratory function stable and patient connected to nasal cannula oxygen Cardiovascular status: blood pressure returned to baseline and stable Postop Assessment: no apparent nausea or vomiting Anesthetic complications: no   No notable events documented.  Last Vitals:  Vitals:   03/20/23 0600 03/20/23 0713  BP:  (!) 121/52  Pulse: (!) 56 60  Resp: 14 19  Temp:  36.6 C  SpO2:  93%    Last Pain:  Vitals:   03/20/23 0713  TempSrc: Oral  PainSc:                  Amanada Philbrick S

## 2023-03-21 ENCOUNTER — Telehealth: Payer: Self-pay | Admitting: Physician Assistant

## 2023-03-21 NOTE — Telephone Encounter (Signed)
Pt called because she got R-TCAR procedure Friday. D/c 10/19.  Wt 99.3 >> 101.9 Kg during hospitalization.  Wt today 101.6 kg  Last pm, pt noticed HR 40s at times. O2 sats dropped to 85% when she laid down, 95% when she sat up. Currently 91% at rest.   She feels tired. Not SOB at rest.   This am, pt noticed HR low 40s when she is at rest.   Recommended she cut the Metop in half.  Pt states she is constipated. No BM in several days. Acute on chronic problem, she has not had Linzess in several days, didn't think it helped much.   She took Miralax this am, no help. Has Colace, has not taken it. Also take Metamucil.  Recommended she take Colace and Miralax twice a day for now. Discuss w/ MD if she should go back on Linzess.   She was on Ozempic (not on med list) and held it a week before the surgery, wants to know if she can restart it >> yes.  At this time, she feels only a little tired, does not wish to go to ER.  Understands that if HR sustained < 40 and/or she feels worse, call 911 and come in.  Theodore Demark, PA-C 03/21/2023 2:49 PM

## 2023-03-21 NOTE — Discharge Summary (Signed)
Discharge Summary     Emily [name] 12-21-1950 72 y.o. female  [id number]  Admission Date: 03/19/2023  Discharge Date: 03/20/23  Physician: Dr. Myra Gianotti  Admission Diagnosis: Carotid artery stenosis [I65.29]  Discharge Day services:   See progress note 03/20/2023  Hospital Course:  Emily [name] is a 72 year old female who underwent right sided TCAR with shockwave lithotripsy by Dr. 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  She tolerated the procedure well and was admitted to the hospital postoperatively.  POD #1 her neuroexam remained at baseline.  Her right neck incision was well-appearing and her left groin venous cath site was without hematoma.  She was ready for discharge home.  She will continue her aspirin, Plavix, statin daily.  She will follow-up in 1 month in the office with a carotid duplex.  She was discharged home in stable condition.   Recent Labs    03/20/23 0500  NA 134*  K 4.3  CL 101  CO2 22  GLUCOSE 203*  BUN 24*  CALCIUM 8.5*   Recent Labs    03/20/23 0500  WBC 13.7*  HGB 9.9*  HCT 31.7*  PLT 258   No results for input(s): "INR" in the last 72 hours.     Discharge Diagnosis:  Carotid artery stenosis [I65.29]  Secondary Diagnosis: Patient Active Problem List   Diagnosis Date Noted   Carotid artery stenosis 03/19/2023   Nonrheumatic aortic valve stenosis 03/19/2020   Persistent atrial fibrillation (HCC) 01/16/2020   OSA on CPAP 09/25/2019   History of adenomatous polyp of colon 08/30/2019   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 device 07/16/2018   Chronic anticoagulation 03/03/2018   Restless leg syndrome 02/16/2018   Chronic left-sided low back pain with left-sided sciatica 02/16/2018   Morbid obesity (HCC) 02/14/2018   Bruit 02/14/2018   DDD (degenerative disc disease), lumbar 12/07/2017   Peripheral arterial disease (HCC) 07/06/2017   Type 2 diabetes mellitus with  hyperlipidemia (HCC) 06/29/2017   Iron deficiency anemia 05/27/2017   Vitamin B12 deficiency 05/27/2017   Pain in joint, ankle and foot 06/03/2016   Bilateral carotid artery disease (HCC) 07/05/2015   Multiple gastric polyps 07/03/2014   Hypertension associated with diabetes (HCC) 10/27/2013   Hypothyroidism due to acquired atrophy of thyroid 10/27/2013   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HCC) 02/14/2007   Past Medical History:  Diagnosis Date   Anemia    Arthritis    Atrial fibrillation, rapid (HCC) 10/27/2013   Bilateral carotid artery disease (HCC)    Complication of anesthesia    difficult intubation in past   Diabetes mellitus without complication (HCC)    type 2   Difficult intubation    states 'lady that did the sleep study told me I have the smallest airway she has ever seen in an adult"   Dyslipidemia 06/29/2017   Heart murmur    History of blood transfusion 09/2019   GI bleed - in CE   Hypertension    Hypothyroid    Obesity    Obstructive sleep apnea    occasional uses CPAP   Peripheral vascular disease (HCC)    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" - xrays were negative   Upper GI bleed 09/25/2019    Allergies as of 03/20/2023       Reactions   Jardiance [empagliflozin] Other (See Comments)   RECURRENT VAGINITIS   Quinapril Hcl Cough   Statins Other (See Comments)   Not  all Statins but some cause cough and pain in legs.   Tape Other (See Comments)   Redness, please use "paper" tape        Medication List     TAKE these medications    Accu-Chek Guide test strip Generic drug: glucose blood Test BS daily Dx E11.69   Accu-Chek Softclix Lancets lancets Test BS daily Dx E11.69   aspirin EC 81 MG tablet Take 81 mg by mouth daily.   bisacodyl 5 MG EC tablet Commonly known as: DULCOLAX Take 5 mg by mouth daily as needed (constipation).   clopidogrel 75 MG tablet Commonly known as: PLAVIX Take 1 tablet (75 mg total) by mouth  daily.   diltiazem 180 MG 24 hr capsule Commonly known as: CARDIZEM CD TAKE 1 CAPSULE BY MOUTH EVERY DAY   diltiazem 30 MG tablet Commonly known as: CARDIZEM Take 30 mg by mouth 3 (three) times daily as needed (afib).   ezetimibe 10 MG tablet Commonly known as: ZETIA TAKE 1 TABLET BY MOUTH EVERY DAY   flecainide 100 MG tablet Commonly known as: TAMBOCOR TAKE 1 TABLET BY MOUTH TWICE A DAY   furosemide 20 MG tablet Commonly known as: LASIX Take 1 tablet (20 mg total) by mouth daily as needed for fluid.   gabapentin 300 MG capsule Commonly known as: NEURONTIN Take 1 capsule (300 mg total) by mouth 2 (two) times daily as needed (neuropathy right leg).   glipiZIDE 5 MG tablet Commonly known as: GLUCOTROL Take 1 tablet (5 mg total) by mouth daily before breakfast. Dose reduction   HYDROcodone-acetaminophen 5-325 MG tablet Commonly known as: Norco Take 1 tablet by mouth every 6 (six) hours as needed for severe pain (pain score 7-10).   levonorgestrel 20 MCG/DAY Iud Commonly known as: MIRENA 1 each by Intrauterine route once.   levothyroxine 112 MCG tablet Commonly known as: SYNTHROID TAKE 1 TABLET BY MOUTH EVERY OTHER DAY, ALTERNATING WITH 1 & 1/2 TABLETS EVERY OTHER DAY   linaclotide 72 MCG capsule Commonly known as: LINZESS Take 72 mcg by mouth daily before breakfast.   losartan 25 MG tablet Commonly known as: COZAAR TAKE 1 TABLET (25 MG TOTAL) BY MOUTH DAILY.   lovastatin 40 MG tablet Commonly known as: MEVACOR TAKE 1 TABLET BY MOUTH EVERY DAY IN THE EVENING   metFORMIN 1000 MG tablet Commonly known as: GLUCOPHAGE Take 1 tablet (1,000 mg total) by mouth 2 (two) times daily with a meal. What changed: how much to take   metoprolol succinate 100 MG 24 hr tablet Commonly known as: TOPROL-XL TAKE 1 TABLET BY MOUTH EVERY DAY WITH OR IMMEDIATELY FOLLOWING A MEAL   psyllium 58.6 % packet Commonly known as: METAMUCIL Take 1 packet by mouth every other day as needed  (regularity).   rOPINIRole 0.5 MG tablet Commonly known as: REQUIP Take 1 tablet (0.5 mg total) by mouth at bedtime.   Semaglutide(0.25 or 0.5MG /DOS) 2 MG/3ML Sopn Inject 0.5 mg into the skin once a week. What changed: additional instructions         Discharge Instructions:   [facility] Discharge Instructions Carotid Endarterectomy (CEA)  Please refer to the following instructions for your post-procedure care. Your surgeon or physician assistant will discuss any changes with you.  Activity  You are encouraged to walk as much as you can. You can slowly return to normal activities but must avoid strenuous activity and heavy lifting until your doctor tell you it's OK. Avoid activities such as  vacuuming or swinging a golf club. You can drive after one week if you are comfortable and you are no longer taking prescription pain medications. It is normal to feel tired for serval weeks after your surgery. It is also normal to have difficulty with sleep habits, eating, and bowel movements after surgery. These will go away with time.  Bathing/Showering  You may shower after you come home. Do not soak in a bathtub, hot tub, or swim until the incision heals completely.  Incision Care  Shower every day. Clean your incision with mild soap and water. Pat the area dry with a clean towel. You do not need a bandage unless otherwise instructed. Do not apply any ointments or creams to your incision. You may have skin glue on your incision. Do not peel it off. It will come off on its own in about one week. Your incision may feel thickened and raised for several weeks after your surgery. This is normal and the skin will soften over time. For Men Only: It's OK to shave around the incision but do not shave the incision itself for 2 weeks. It is common to have numbness under your chin that could last for several months.  Diet  Resume your normal diet. There are no special  food restrictions following this procedure. A low fat/low cholesterol diet is recommended for all patients with vascular disease. In order to heal from your surgery, it is CRITICAL to get adequate nutrition. Your body requires vitamins, minerals, and protein. Vegetables are the best source of vitamins and minerals. Vegetables also provide the perfect balance of protein. Processed food has little nutritional value, so try to avoid this.  Medications  Resume taking all of your medications unless your doctor or physician assistant tells you not to.  If your incision is causing pain, you may take over-the- counter pain relievers such as acetaminophen (Tylenol). If you were prescribed a stronger pain medication, please be aware these medications can cause nausea and constipation.  Prevent nausea by taking the medication with a snack or meal. Avoid constipation by drinking plenty of fluids and eating foods with a high amount of fiber, such as fruits, vegetables, and grains. Do not take Tylenol if you are taking prescription pain medications.  Follow Up  Our office will schedule a follow up appointment 2-3 weeks following discharge.  Please call us immediately for any of the following conditions  Increased pain, redness, drainage (pus) from your incision site. Fever of 101 degrees or higher. If you should develop stroke (slurred speech, difficulty swallowing, weakness on one side of your body, loss of vision) you should call 911 and go to the nearest emergency room.  Reduce your risk of vascular disease:  Stop smoking. If you would like help call [facility] at 1-800-QUIT-NOW ((254)024-8871) or [place] at (902)094-2538. Manage your cholesterol Maintain a desired weight Control your diabetes Keep your blood pressure down  If you have any questions, please call the office at [phone].   Disposition: home  Patient's condition: is Good  Follow up: 1. [facility] in 4 weeks.   Emilie Rutter,  PA-C [facility] [phone]   --- For [facility] Registry use ---   Modified Rankin score at D/C (0-6): 0  IV medication needed for:  1. Hypertension: No 2. Hypotension: No  Post-op Complications: No  1. Post-op CVA or TIA: No  If yes: Event classification (right eye, left eye, right cortical, left cortical, verterobasilar, other):   If yes: Timing  of event (intra-op, <6 hrs post-op, >=6 hrs post-op, unknown):   2. CN injury: No  If yes: CN  injuried   3. Myocardial infarction: No  If yes: Dx by (EKG or clinical, Troponin):   4.  CHF: No  5.  Dysrhythmia (new): No  6. Wound infection: No  7. Reperfusion symptoms: No  8. Return to OR: No  If yes: return to OR for (bleeding, neurologic, other CEA incision, other):   Discharge medications: Statin use:  Yes ASA use:  Yes   Beta blocker use:  Yes ACE-Inhibitor use:  No  ARB use:  Yes CCB use: Yes P2Y12 Antagonist use: Yes, [ x] Plavix, [ ]  Plasugrel, [ ]  Ticlopinine, [ ]  Ticagrelor, [ ]  Other, [ ]  No for medical reason, [ ]  Non-compliant, [ ]  Not-indicated Anti-coagulant use:  No, [ ]  Warfarin, [ ]  Rivaroxaban, [ ]  Dabigatran,

## 2023-03-22 ENCOUNTER — Telehealth: Payer: Self-pay

## 2023-03-22 ENCOUNTER — Encounter (HOSPITAL_COMMUNITY): Payer: Self-pay | Admitting: Surgery

## 2023-03-22 NOTE — Transitions of Care (Post Inpatient/ED Visit) (Signed)
03/22/2023  Name: Emily [name] MRN: [id number] DOB: 12/03/1950  Today's TOC FU Call Status: Today's TOC FU Call Status:: Successful TOC FU Call Completed TOC FU Call Complete Date: 03/22/23 Patient's Name and Date of Birth confirmed.  Transition Care Management Follow-up Telephone Call Date of Discharge: 03/20/23 Discharge Facility: [facility]) Type of Discharge: Inpatient Admission Primary Inpatient Discharge Diagnosis:: TCAR How have you been since you were released from the hospital?: Same Any questions or concerns?: No  Items Reviewed: Did you receive and understand the discharge instructions provided?: Yes Medications obtained,verified, and reconciled?: Yes (Medications Reviewed) Any new allergies since your discharge?: No Dietary orders reviewed?: NA Do you have support at home?: No  Medications Reviewed Today: Medications Reviewed Today     Reviewed by Redge Gainer, RN (Case Manager) on 03/22/23 at 1540  Med List Status: <None>   Medication Order Taking? Sig Documenting Provider Last Dose Status Informant  Accu-Chek Softclix Lancets lancets [id number]  Test BS daily Dx E11.69 Raliegh Ip, DO  Active Self  aspirin EC 81 MG tablet [id number] No Take 81 mg by mouth daily. [provider] 03/18/2023 Active Self  bisacodyl (DULCOLAX) 5 MG EC tablet [id number] No Take 5 mg by mouth daily as needed (constipation). [provider] Past Month Active Self  clopidogrel (PLAVIX) 75 MG tablet [id number] No Take 1 tablet (75 mg total) by mouth daily. Nada Libman, MD 03/18/2023 Active Self  cyanocobalamin ((VITAMIN B-12)) injection 1,000 mcg [id number]   Elenora Gamma, MD  Active   diltiazem [facility] CD) 180 MG 24 hr capsule [id number] No TAKE 1 CAPSULE BY MOUTH EVERY DAY Rollene Rotunda, MD 03/18/2023 Active Self  diltiazem (CARDIZEM) 30 MG tablet [id number] No Take 30 mg by mouth 3 (three) times daily as needed (afib). [provider] More than a month Active Self  ezetimibe (ZETIA) 10 MG tablet [id number] No TAKE 1 TABLET BY MOUTH EVERY Kenn File, MD 03/18/2023 Active Self  flecainide (TAMBOCOR) 100 MG tablet [id number] No TAKE 1 TABLET BY MOUTH TWICE A Kenn File, MD 03/18/2023 Active Self  furosemide (LASIX) 20 MG tablet [id number] No Take 1 tablet (20 mg total) by mouth daily as needed for fluid. Daphine Deutscher, Mary-Margaret, FNP Past Week Active Self  gabapentin (NEURONTIN) 300 MG capsule [id number] No Take 1 capsule (300 mg total) by mouth 2 (two) times daily as needed (neuropathy right leg). Raliegh Ip, DO 03/19/2023 0530 Active Self  glipiZIDE (GLUCOTROL) 5 MG tablet [id number] No Take 1 tablet (5 mg total) by mouth daily before breakfast. Dose reduction Raliegh Ip, DO 03/18/2023 Active Self  glucose blood (ACCU-CHEK GUIDE) test strip [id number]  Test BS daily Dx E11.69 Raliegh Ip, DO  Active Self  HYDROcodone-acetaminophen (NORCO) 5-325 MG tablet [id number]  Take 1 tablet by mouth every 6 (six) hours as needed for severe pain (pain score 7-10). Emilie Rutter, PA-C  Active   levonorgestrel (MIRENA) 20 MCG/DAY IUD [id number]  1 each by Intrauterine route once. [provider]  Active   levothyroxine (SYNTHROID) 112 MCG tablet [id number] No TAKE 1 TABLET BY MOUTH EVERY OTHER DAY, ALTERNATING WITH 1 & 1/2 TABLETS EVERY OTHER DAY Delynn Flavin M, DO 03/19/2023 0530 Active Self  linaclotide (LINZESS) 72 MCG capsule [id number] No Take 72 mcg by mouth daily before breakfast. [provider] 03/18/2023 Active Self  losartan (COZAAR) 25 MG tablet [id number] No TAKE 1 TABLET (25 MG TOTAL) BY MOUTH DAILY. Rollene Rotunda, MD 03/18/2023 Active Self  lovastatin (MEVACOR) 40 MG tablet [id number] No TAKE 1 TABLET BY MOUTH EVERY DAY IN THE Shea Stakes, MD 03/18/2023 Active Self  metFORMIN (GLUCOPHAGE) 1000 MG tablet [id number] No Take 1 tablet (1,000 mg total) by mouth 2 (two)  times daily with a meal.  Patient taking differently: Take 500 mg by mouth 2 (two) times daily with a meal.   Delynn Flavin M, DO 03/18/2023 Active Self  metoprolol succinate (TOPROL-XL) 100 MG 24 hr tablet [id number] No TAKE 1 TABLET BY MOUTH EVERY DAY WITH OR IMMEDIATELY FOLLOWING A MEAL Gottschalk, Ashly M, DO 03/19/2023 Active Self  psyllium (METAMUCIL) 58.6 % packet [id number] No Take 1 packet by mouth every other day as needed (regularity). [provider] Past Week Active Self  rOPINIRole (REQUIP) 0.5 MG tablet [id number] No Take 1 tablet (0.5 mg total) by mouth at bedtime. Delynn Flavin M, DO 03/18/2023 Active Self  Semaglutide,0.25 or 0.5MG /DOS, 2 MG/3ML SOPN [id number] No Inject 0.5 mg into the skin once a week.  Patient taking differently: Inject 0.5 mg into the skin once a week. Takes on Friday   Delynn Flavin [place], [place] 03/05/2023 Active Self           Med Note Alphonzo Dublin   Fri Mar 05, 2023 12:44 PM)              Home Care and Equipment/Supplies: Were Home Health Services Ordered?: NA Any new equipment or medical supplies ordered?: NA  Functional Questionnaire: Do you need assistance with bathing/showering or dressing?: No Do you need assistance with meal preparation?: No Do you need assistance with eating?: No Do you have difficulty maintaining continence: No Do you need assistance with getting out of bed/getting out of a chair/moving?: No Do you have difficulty managing or taking your medications?: No  Follow up appointments reviewed: PCP Follow-up appointment confirmed?: NA Specialist Hospital Follow-up appointment confirmed?: Yes Date of Specialist follow-up appointment?: 04/26/23 Follow-Up Specialty Provider:: [facility] Do you need transportation to your follow-up appointment?: No Do you understand care options if your condition(s) worsen?: Yes-patient verbalized understanding  SDOH Interventions Today    Flowsheet Row Most Recent Value   SDOH Interventions   Food Insecurity Interventions Intervention Not Indicated  Transportation Interventions Intervention Not Indicated      The patient declined 30 day enrollment. Her Heart rate is in the 40's. She states this can be normal for her but she has a call into the Cardiologist  Deidre Ala, RN RN Care Manager [facility] [phone]

## 2023-03-23 ENCOUNTER — Ambulatory Visit: Payer: Medicare Other | Admitting: Cardiology

## 2023-03-24 ENCOUNTER — Encounter: Payer: Self-pay | Admitting: Family Medicine

## 2023-03-27 ENCOUNTER — Other Ambulatory Visit: Payer: Self-pay | Admitting: Family Medicine

## 2023-04-09 ENCOUNTER — Other Ambulatory Visit: Payer: Self-pay

## 2023-04-09 DIAGNOSIS — I6523 Occlusion and stenosis of bilateral carotid arteries: Secondary | ICD-10-CM

## 2023-04-20 ENCOUNTER — Ambulatory Visit (INDEPENDENT_AMBULATORY_CARE_PROVIDER_SITE_OTHER): Payer: Medicare Other

## 2023-04-20 ENCOUNTER — Other Ambulatory Visit: Payer: Self-pay | Admitting: Family Medicine

## 2023-04-20 ENCOUNTER — Other Ambulatory Visit: Payer: Self-pay

## 2023-04-20 ENCOUNTER — Other Ambulatory Visit: Payer: Medicare Other

## 2023-04-20 DIAGNOSIS — D509 Iron deficiency anemia, unspecified: Secondary | ICD-10-CM

## 2023-04-20 DIAGNOSIS — E538 Deficiency of other specified B group vitamins: Secondary | ICD-10-CM

## 2023-04-20 DIAGNOSIS — E1169 Type 2 diabetes mellitus with other specified complication: Secondary | ICD-10-CM

## 2023-04-20 LAB — CBC
Hematocrit: 34.3 % (ref 34.0–46.6)
Hemoglobin: 11 g/dL — ABNORMAL LOW (ref 11.1–15.9)
MCH: 29.1 pg (ref 26.6–33.0)
MCHC: 32.1 g/dL (ref 31.5–35.7)
MCV: 91 fL (ref 79–97)
Platelets: 232 10*3/uL (ref 150–450)
RBC: 3.78 x10E6/uL (ref 3.77–5.28)
RDW: 14.6 % (ref 11.7–15.4)
WBC: 5.8 10*3/uL (ref 3.4–10.8)

## 2023-04-20 LAB — HEMOGLOBIN, FINGERSTICK: Hemoglobin: 11 g/dL — ABNORMAL LOW (ref 11.1–15.9)

## 2023-04-20 NOTE — Progress Notes (Signed)
B12 injection right deltoid patient tolerated well

## 2023-04-26 ENCOUNTER — Encounter: Payer: Self-pay | Admitting: Family Medicine

## 2023-04-26 ENCOUNTER — Ambulatory Visit (INDEPENDENT_AMBULATORY_CARE_PROVIDER_SITE_OTHER): Payer: Medicare Other | Admitting: Physician Assistant

## 2023-04-26 ENCOUNTER — Ambulatory Visit (HOSPITAL_COMMUNITY)
Admission: RE | Admit: 2023-04-26 | Discharge: 2023-04-26 | Disposition: A | Discharge status: Home / Self Care | Payer: Medicare Other | Source: Ambulatory Visit | Attending: Surgery | Admitting: Surgery

## 2023-04-26 VITALS — BP 135/72 | HR 60 | Temp 97.1°F | Ht 62.0 in | Wt 218.0 lb

## 2023-04-26 DIAGNOSIS — I6523 Occlusion and stenosis of bilateral carotid arteries: Secondary | ICD-10-CM

## 2023-04-26 NOTE — Progress Notes (Deleted)
HISTORY AND PHYSICAL     CC:  follow up. Requesting Provider:  Raliegh Ip, DO  HPI: This is a 72 y.o. female here for follow up for carotid artery stenosis.  Pt is s/p left CEA for asymptomatic carotid artery stenosis on 08/20/2015 by Dr. Myra Gianotti.    She underwent right TCAR on 03/19/2023 for asymptomatic carotid artery stenosis also by Dr. Myra Gianotti.  Pt was last seen *** and at that time ***  Pt returns today for follow up.    Pt *** any amaurosis fugax, speech difficulties, weakness, numbness, paralysis or clumsiness or facial droop.    ***  The pt is on a statin for cholesterol management.  The pt is on a daily aspirin.   Other AC:  Plavix The pt is on CCB, diuretic, ARB, BB for hypertension.   The pt is  on medication for diabetes Tobacco hx:  never  Pt does not have family hx of AAA.  Past Medical History:  Diagnosis Date   Anemia    Arthritis    Atrial fibrillation, rapid (HCC) 10/27/2013   Bilateral carotid artery disease (HCC)    Complication of anesthesia    difficult intubation in past   Diabetes mellitus without complication (HCC)    type 2   Difficult intubation    states 'lady that did the sleep study told me I have the smallest airway she has ever seen in an adult"   Dyslipidemia 06/29/2017   Heart murmur    History of blood transfusion 09/2019   GI bleed - in [facility]   Hypertension    Hypothyroid    Obesity    Obstructive sleep apnea    occasional uses CPAP   Peripheral vascular disease (HCC)    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" - xrays were negative   Upper GI bleed 09/25/2019    Past Surgical History:  Procedure Laterality Date   CATARACT EXTRACTION Left    CATARACT EXTRACTION W/PHACO Right 12/09/2015   Procedure: CATARACT EXTRACTION PHACO AND INTRAOCULAR LENS PLACEMENT (IOC);  Surgeon: Gemma Payor, MD;  Location: [facility];  Service: Ophthalmology;  Laterality: Right;  CDE:10.48   COLONOSCOPY W/ POLYPECTOMY      cyst on back of neck removed     DILATION AND CURETTAGE OF UTERUS     x 5   ENDARTERECTOMY Left 08/30/2015   Procedure: LEFT CAROYID ENDARTERECTOMY WITH XENOSURE BOVINE PERICARDIUM PATCH ANGIOPLASTY;  Surgeon: Nada Libman, MD;  Location: [facility];  Service: Vascular;  Laterality: Left;   TONSILLECTOMY     TRANSCAROTID ARTERY REVASCULARIZATION  Right 03/19/2023   Procedure: Right Transcarotid Artery Revascularization;  Surgeon: Nada Libman, MD;  Location: [facility];  Service: Vascular;  Laterality: Right;   ULTRASOUND GUIDANCE FOR VASCULAR ACCESS Left 03/19/2023   Procedure: ULTRASOUND GUIDANCE FOR VASCULAR ACCESS;  Surgeon: Nada Libman, MD;  Location: [facility];  Service: Vascular;  Laterality: Left;   UPPER GI ENDOSCOPY     x several    Allergies  Allergen Reactions   Jardiance [Empagliflozin] Other (See Comments)    RECURRENT VAGINITIS   Quinapril Hcl Cough   Statins Other (See Comments)    Not all Statins but some cause cough and pain in legs.   Tape Other (See Comments)    Redness, please use "paper" tape    Current Outpatient Medications  Medication Sig Dispense Refill   Accu-Chek Softclix Lancets lancets Test BS daily Dx E11.69 100 each  3   aspirin EC 81 MG tablet Take 81 mg by mouth daily.     bisacodyl (DULCOLAX) 5 MG EC tablet Take 5 mg by mouth daily as needed (constipation).     clopidogrel (PLAVIX) 75 MG tablet Take 1 tablet (75 mg total) by mouth daily. 30 tablet 6   diltiazem (CARDIZEM CD) 180 MG 24 hr capsule TAKE 1 CAPSULE BY MOUTH EVERY DAY 90 capsule 2   diltiazem (CARDIZEM) 30 MG tablet Take 30 mg by mouth 3 (three) times daily as needed (afib).     ezetimibe (ZETIA) 10 MG tablet TAKE 1 TABLET BY MOUTH EVERY DAY 90 tablet 3   flecainide (TAMBOCOR) 100 MG tablet TAKE 1 TABLET BY MOUTH TWICE A DAY 180 tablet 3   furosemide (LASIX) 20 MG tablet Take 1 tablet (20 mg total) by mouth daily as needed for fluid. 90 tablet 0   gabapentin (NEURONTIN) 300 MG capsule  Take 1 capsule (300 mg total) by mouth 2 (two) times daily as needed (neuropathy right leg). 180 capsule 3   glipiZIDE (GLUCOTROL) 5 MG tablet Take 1 tablet (5 mg total) by mouth daily before breakfast. Dose reduction 90 tablet 3   glucose blood (ACCU-CHEK GUIDE) test strip Test BS daily Dx E11.69 100 each 3   HYDROcodone-acetaminophen (NORCO) 5-325 MG tablet Take 1 tablet by mouth every 6 (six) hours as needed for severe pain (pain score 7-10). 10 tablet 0   levonorgestrel (MIRENA) 20 MCG/DAY IUD 1 each by Intrauterine route once.     levothyroxine (SYNTHROID) 112 MCG tablet TAKE 1 TABLET BY MOUTH EVERY OTHER DAY, ALTERNATING WITH 1 & 1/2 TABLETS EVERY OTHER DAY 112 tablet 3   linaclotide (LINZESS) 72 MCG capsule Take 72 mcg by mouth daily before breakfast.     losartan (COZAAR) 25 MG tablet TAKE 1 TABLET (25 MG TOTAL) BY MOUTH DAILY. 90 tablet 3   lovastatin (MEVACOR) 40 MG tablet TAKE 1 TABLET BY MOUTH EVERY DAY IN THE EVENING 90 tablet 3   metFORMIN (GLUCOPHAGE) 1000 MG tablet Take 1 tablet (1,000 mg total) by mouth 2 (two) times daily with a meal. (Patient taking differently: Take 500 mg by mouth 2 (two) times daily with a meal.) 180 tablet 3   metoprolol succinate (TOPROL-XL) 100 MG 24 hr tablet TAKE 1 TABLET BY MOUTH EVERY DAY WITH OR IMMEDIATELY FOLLOWING A MEAL 90 tablet 1   psyllium (METAMUCIL) 58.6 % packet Take 1 packet by mouth every other day as needed (regularity).     rOPINIRole (REQUIP) 0.5 MG tablet Take 1 tablet (0.5 mg total) by mouth at bedtime. 90 tablet 3   Semaglutide,0.25 or 0.5MG /DOS, 2 MG/3ML SOPN Inject 0.5 mg into the skin once a week. (Patient taking differently: Inject 0.5 mg into the skin once a week. Takes on Friday) 3 mL    Current Facility-Administered Medications  Medication Dose Route Frequency Provider Last Rate Last Admin   cyanocobalamin ((VITAMIN B-12)) injection 1,000 mcg  1,000 mcg Intramuscular Q30 days Elenora Gamma, MD   1,000 mcg at 04/20/23 1005     Family History  Problem Relation Age of Onset   COPD Father    Heart failure Father    Heart disease Father    Emphysema Father    Arrhythmia Sister    Arrhythmia Sister    Arrhythmia Sister        had PPM also   Cancer Sister     Social History   Socioeconomic History  Marital status: Single    Spouse name: Not on file   Number of children: Not on file   Years of education: Not on file   Highest education level: Not on file  Occupational History   Not on file  Tobacco Use   Smoking status: Never    Passive exposure: Never   Smokeless tobacco: Never  Vaping Use   Vaping status: Never Used  Substance and Sexual Activity   Alcohol use: No    Alcohol/week: 0.0 standard drinks of alcohol   Drug use: Never   Sexual activity: Not Currently    Birth control/protection: Post-menopausal  Other Topics Concern   Not on file  Social History Narrative   Not on file   Social Determinants of Health   Financial Resource Strain: Low Risk  (04/21/2022)   Overall Financial Resource Strain (CARDIA)    Difficulty of Paying Living Expenses: Not hard at all  Food Insecurity: No Food Insecurity (03/22/2023)   Hunger Vital Sign    Worried About Running Out of Food in the Last Year: Never true    Ran Out of Food in the Last Year: Never true  Transportation Needs: No Transportation Needs (03/22/2023)   PRAPARE - Administrator, Civil Service (Medical): No    Lack of Transportation (Non-Medical): No  Physical Activity: Inactive (04/21/2022)   Exercise Vital Sign    Days of Exercise per Week: 0 days    Minutes of Exercise per Session: 0 min  Stress: No Stress Concern Present (04/21/2022)   [employer] of Occupational Health - Occupational Stress Questionnaire    Feeling of Stress : Not at all  Social Connections: Moderately Integrated (04/21/2022)   Social Connection and Isolation Panel [NHANES]    Frequency of Communication with Friends and Family: More than  three times a week    Frequency of Social Gatherings with Friends and Family: Twice a week    Attends Religious Services: More than 4 times per year    Active Member of [employer] or Organizations: Yes    Attends Engineer, structural: More than 4 times per year    Marital Status: Never married  Intimate Partner Violence: Not At Risk (03/19/2023)   Humiliation, Afraid, Rape, and Kick questionnaire    Fear of Current or Ex-Partner: No    Emotionally Abused: No    Physically Abused: No    Sexually Abused: No     REVIEW OF SYSTEMS:  *** [X]  denotes positive finding, [ ]  denotes negative finding Cardiac  Comments:  Chest pain or chest pressure:    Shortness of breath upon exertion:    Short of breath when lying flat:    Irregular heart rhythm:        Vascular    Pain in calf, thigh, or hip brought on by ambulation:    Pain in feet at night that wakes you up from your sleep:     Blood clot in your veins:    Leg swelling:         Pulmonary    Oxygen at home:    Productive cough:     Wheezing:         Neurologic    Sudden weakness in arms or legs:     Sudden numbness in arms or legs:     Sudden onset of difficulty speaking or slurred speech:    Temporary loss of vision in one eye:     Problems with dizziness:  Gastrointestinal    Blood in stool:     Vomited blood:         Genitourinary    Burning when urinating:     Blood in urine:        Psychiatric    Major depression:         Hematologic    Bleeding problems:    Problems with blood clotting too easily:        Skin    Rashes or ulcers:        Constitutional    Fever or chills:      PHYSICAL EXAMINATION:  ***  General:  WDWN in NAD; vital signs documented above Gait: Not observed HENT: WNL, normocephalic Pulmonary: normal non-labored breathing Cardiac: {Desc; regular/irreg:14544} HR, {With/Without:20273} carotid bruit*** Abdomen: soft, NT; aortic pulse is *** palpable Skin:  {With/Without:20273} rashes Vascular Exam/Pulses:  Right Left  Radial {Exam; arterial pulse strength 0-4:30167} {Exam; arterial pulse strength 0-4:30167}  Popliteal {Exam; arterial pulse strength 0-4:30167} {Exam; arterial pulse strength 0-4:30167}  DP {Exam; arterial pulse strength 0-4:30167} {Exam; arterial pulse strength 0-4:30167}  PT {Exam; arterial pulse strength 0-4:30167} {Exam; arterial pulse strength 0-4:30167}   Extremities: {With/Without:20273} open wounds Musculoskeletal: no muscle wasting or atrophy  Neurologic: A&O X 3; moving all extremities equally; speech is fluent/normal Psychiatric:  The pt has {Desc; normal/abnormal:11317::"Normal"} affect.   Non-Invasive Vascular Imaging:   Carotid Duplex on 04/26/2023 Right:  ***% ICA stenosis Left:  ***% ICA stenosis ***  Previous Carotid duplex on 01/11/2023: Right: 80-99% ICA stenosis Left:   1-39% ICA stenosis    ASSESSMENT/PLAN:: 72 y.o. female here for follow up carotid artery stenosis and is  s/p left CEA for asymptomatic carotid artery stenosis on 08/20/2015 by Dr. Myra Gianotti.    She underwent right TCAR on 03/19/2023 for asymptomatic carotid artery stenosis also by Dr. Myra Gianotti.  -duplex today reveals *** -discussed s/s of stroke with pt and she understands should she develop any of these sx, she will go to the nearest ER or call 911. -pt will f/u in *** with carotid duplex -pt will call sooner should she have any issues. -continue statin/asa/plavix   Doreatha Massed, [facility] [phone]  Clinic MD:  *** on call MD

## 2023-04-26 NOTE — Progress Notes (Signed)
POST OPERATIVE OFFICE NOTE    CC:  F/u for surgery  HPI:  This is a 72 y.o. female who is s/p TCAR  on 03/19/23 by Dr. Myra Gianotti for greater than 80% stenosis on the right.   She has been asymptomatic for stroke/TIA.  She denies amaurosis, aphasia or weakness on one side of her body.      Pt returns today for follow up.  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  She  She  is taking ASA, Plavix and has an allergy to Statins.  She was worried about her HGB levels they were reported as 9.9 in the hospital, but the most recent was 11 6 days ago.  She denies hemoptysis or melena.    Allergies  Allergen Reactions   Jardiance [Empagliflozin] Other (See Comments)    RECURRENT VAGINITIS   Quinapril Hcl Cough   Statins Other (See Comments)    Not all Statins but some cause cough and pain in legs.   Tape Other (See Comments)    Redness, please use "paper" tape    Current Outpatient Medications  Medication Sig Dispense Refill   Accu-Chek Softclix Lancets lancets Test BS daily Dx E11.69 100 each 3   aspirin EC 81 MG tablet Take 81 mg by mouth daily.     bisacodyl (DULCOLAX) 5 MG EC tablet Take 5 mg by mouth daily as needed (constipation).     clopidogrel (PLAVIX) 75 MG tablet Take 1 tablet (75 mg total) by mouth daily. 30 tablet 6   diltiazem (CARDIZEM CD) 180 MG 24 hr capsule TAKE 1 CAPSULE BY MOUTH EVERY DAY 90 capsule 2   diltiazem (CARDIZEM) 30 MG tablet Take 30 mg by mouth 3 (three) times daily as needed (afib).     ezetimibe (ZETIA) 10 MG tablet TAKE 1 TABLET BY MOUTH EVERY DAY 90 tablet 3   flecainide (TAMBOCOR) 100 MG tablet TAKE 1 TABLET BY MOUTH TWICE A DAY 180 tablet 3   furosemide (LASIX) 20 MG tablet Take 1 tablet (20 mg total) by mouth daily as needed for fluid. 90 tablet 0   gabapentin (NEURONTIN) 300 MG capsule Take 1 capsule (300 mg total) by mouth 2 (two) times daily as needed (neuropathy right leg). 180 capsule 3   glipiZIDE (GLUCOTROL) 5 MG tablet Take 1  tablet (5 mg total) by mouth daily before breakfast. Dose reduction 90 tablet 3   glucose blood (ACCU-CHEK GUIDE) test strip Test BS daily Dx E11.69 100 each 3   HYDROcodone-acetaminophen (NORCO) 5-325 MG tablet Take 1 tablet by mouth every 6 (six) hours as needed for severe pain (pain score 7-10). 10 tablet 0   levonorgestrel (MIRENA) 20 MCG/DAY IUD 1 each by Intrauterine route once.     levothyroxine (SYNTHROID) 112 MCG tablet TAKE 1 TABLET BY MOUTH EVERY OTHER DAY, ALTERNATING WITH 1 & 1/2 TABLETS EVERY OTHER DAY 112 tablet 3   linaclotide (LINZESS) 72 MCG capsule Take 72 mcg by mouth daily before breakfast.     losartan (COZAAR) 25 MG tablet TAKE 1 TABLET (25 MG TOTAL) BY MOUTH DAILY. 90 tablet 3   lovastatin (MEVACOR) 40 MG tablet TAKE 1 TABLET BY MOUTH EVERY DAY IN THE EVENING 90 tablet 3   metFORMIN (GLUCOPHAGE) 1000 MG tablet Take 1 tablet (1,000 mg total) by mouth 2 (two) times daily with a meal. (Patient taking differently: Take 500 mg by mouth 2 (two) times daily with a meal.) 180 tablet 3   metoprolol succinate (  TOPROL-XL) 100 MG 24 hr tablet TAKE 1 TABLET BY MOUTH EVERY DAY WITH OR IMMEDIATELY FOLLOWING A MEAL (Patient taking differently: 50 mg daily.) 90 tablet 1   psyllium (METAMUCIL) 58.6 % packet Take 1 packet by mouth every other day as needed (regularity).     rOPINIRole (REQUIP) 0.5 MG tablet Take 1 tablet (0.5 mg total) by mouth at bedtime. 90 tablet 3   Semaglutide,0.25 or 0.5MG /DOS, 2 MG/3ML SOPN Inject 0.5 mg into the skin once a week. (Patient taking differently: Inject 0.5 mg into the skin once a week. Takes on Friday) 3 mL    Current Facility-Administered Medications  Medication Dose Route Frequency Provider Last Rate Last Admin   cyanocobalamin ((VITAMIN B-12)) injection 1,000 mcg  1,000 mcg Intramuscular Q30 days Elenora Gamma, MD   1,000 mcg at 04/20/23 1005     ROS:  See HPI  Physical Exam:  Right Carotid Findings:   +----------+--------+--------+--------+------------------+--------+           PSV cm/sEDV cm/sStenosisPlaque DescriptionComments  +----------+--------+--------+--------+------------------+--------+  CCA Prox  75      13                                          +----------+--------+--------+--------+------------------+--------+  CCA Mid   69      15              heterogenous                +----------+--------+--------+--------+------------------+--------+  CCA Distal                                          stent     +----------+--------+--------+--------+------------------+--------+  ICA Prox                                            stent     +----------+--------+--------+--------+------------------+--------+  ICA Mid                                             stent     +----------+--------+--------+--------+------------------+--------+  ICA Distal108     22                                          +----------+--------+--------+--------+------------------+--------+  ECA      176                                                 +----------+--------+--------+--------+------------------+--------+   +----------+--------+-------+----------------+-------------------+           PSV cm/sEDV cmsDescribe        Arm Pressure (mmHG)  +----------+--------+-------+----------------+-------------------+  [id number]           Multiphasic, [id number]                  +----------+--------+-------+----------------+-------------------+   +---------+--------+---+--------+--+---------+  VertebralPSV cm/s177EDV  cm/s36Antegrade  +---------+--------+---+--------+--+---------+      Right Stent(s):  +---------------+--------+--------+---------------+--------+---------------  -+  ICA           PSV cm/sEDV cm/sStenosis       WaveformComments           +---------------+--------+--------+---------------+--------+---------------   -+  Prox to Stent  69      15                                                +---------------+--------+--------+---------------+--------+---------------  -+  Proximal Stent 153     36                                                +---------------+--------+--------+---------------+--------+---------------  -+  Mid Stent      158     46      50-75% stenosis        low end of  range  +---------------+--------+--------+---------------+--------+---------------  -+  Distal Stent   168     37                                                +---------------+--------+--------+---------------+--------+---------------  -+  Distal to Stent108     22                                                +---------------+--------+--------+---------------+--------+---------------  -+          Left Carotid Findings:  +----------+--------+--------+--------+------------------+--------+           PSV cm/sEDV cm/sStenosisPlaque DescriptionComments  +----------+--------+--------+--------+------------------+--------+  CCA Prox  89      19                                          +----------+--------+--------+--------+------------------+--------+  CCA Mid   85      20              heterogenous                +----------+--------+--------+--------+------------------+--------+  CCA Distal111     25              calcific                    +----------+--------+--------+--------+------------------+--------+  ICA Prox  88      23      1-39%   heterogenous                +----------+--------+--------+--------+------------------+--------+  ICA Mid   111     30                                          +----------+--------+--------+--------+------------------+--------+  ICA Distal136     32                                          +----------+--------+--------+--------+------------------+--------+  ECA      115                                                  +----------+--------+--------+--------+------------------+--------+   +----------+--------+--------+----------------+-------------------+           PSV cm/sEDV cm/sDescribe        Arm Pressure (mmHG)  +----------+--------+--------+----------------+-------------------+  [id number]            Multiphasic, [id number]                  +----------+--------+--------+----------------+-------------------+   +---------+--------+--+--------+---------+  VertebralPSV cm/s24EDV cm/sAntegrade  +---------+--------+--+--------+---------+    Summary:  Right Carotid: Right ICA stent velocities suggest a 50-75% stenosis.   Left Carotid: Velocities in the left ICA are consistent with a 1-39%  stenosis.   Vertebrals: Bilateral vertebral arteries demonstrate antegrade flow.  Subclavians: Normal flow hemodynamics were seen in bilateral subclavian               arteries.    Incision:  well healed Extremities:  moving all 4 extremities, palpable radial pulses B  Neuro: sensation and motor intact, no tongue deviation or facial droop     Assessment/Plan:  This is a 72 y.o. female who is s/p: Right TCAR by Dr. Myra Gianotti for > 80% asymptomatic stenosis in the ICA.  No neurologic deficits.  The duplex shows 50-75 % stenosis in the stent with EDV of 46.  She is asymptomatic for symptoms of stroke/TIA.  I will bring her back in 6 months for repeat duplex and she will continue ASA and Plavix daily.  She has an allergy to Statins.       Mosetta Pigeon PA-C [facility] [phone] Call MD Hetty Blend

## 2023-04-27 ENCOUNTER — Other Ambulatory Visit: Payer: Self-pay

## 2023-04-27 DIAGNOSIS — I6523 Occlusion and stenosis of bilateral carotid arteries: Secondary | ICD-10-CM

## 2023-05-02 NOTE — Progress Notes (Unsigned)
Cardiology Office Note:   Date:  05/05/2023  ID:  Emily [name], DOB Jul 13, 1950, MRN [id number] PCP: Raliegh Ip, DO  [facility] Providers Cardiologist:  Rollene Rotunda, MD Electrophysiologist:  Will Jorja Loa, MD {  History of Present Illness:   Emily [name] is a 72 y.o. female The patient has paroxysmal atrial fibrillation and was treated with flecainide. She has had recurrent GI bleeding and could not tolerate anticoagulation. She has had transfusions. She subsequently had double-balloon enteroscopy. 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 She did have Watchman.   She also had a follow up echo on 02/22/23 and had a normal EF with moderate aortic stenosis and a gradient of 24.2 mean.   She had carotid stenting since I last saw her.    She did have some bradycardia at home 1 day prior to her carotid stenting and she was very symptomatic with this.  However, this was transient.  She already had a monitor which did not demonstrate any significant bradycardia arrhythmias.  She has not felt like she has had any fibrillation sustained.  She might have had 1 brief episode.  She is not having any presyncope or syncope.  She denies any chest pressure, neck or arm discomfort.  She has had no weight gain or edema.  ROS: As stated in the HPI and negative for all other systems.  Studies Reviewed:    EKG:   EKG Interpretation Date/Time:  Wednesday May 05 2023 09:26:12 EST Ventricular Rate:  61 PR Interval:  206 QRS Duration:  158 QT Interval:  504 QTC Calculation: 507 R Axis:   -65  Text Interpretation: Normal sinus rhythm Right bundle branch block Left anterior fascicular block Bifascicular block Minimal voltage criteria for LVH, may be normal variant ( R in aVL ) When compared with ECG of 25-Feb-2023 15:15, No significant change since last tracing Confirmed by Rollene Rotunda ([id number]) on 05/05/2023 10:07:40 AM    Risk  Assessment/Calculations:    CHA2DS2-VASc Score = 4   This indicates a 4.8% annual risk of stroke. The patient's score is based upon: CHF History: 0 HTN History: 1 Diabetes History: 1 Stroke History: 0 Vascular Disease History: 0 Age Score: 1 Gender Score: 1    Physical Exam:   VS:  BP (!) 158/70   Pulse 61   Ht 5' 2.5" (1.588 m)   Wt 216 lb (98 kg)   BMI 38.88 kg/m    Wt Readings from Last 3 Encounters:  05/05/23 216 lb (98 kg)  04/26/23 218 lb (98.9 kg)  03/19/23 223 lb 8.7 oz (101.4 kg)     GEN: Well nourished, well developed in no acute distress NECK: No JVD; No carotid bruits CARDIAC: RRR, early to mid peaking 3 out of 6 systolic murmur radiating slightly up to aortic outflow tract, no diastolic murmurs, rubs, gallops RESPIRATORY:  Clear to auscultation without rales, wheezing or rhonchi  ABDOMEN: Soft, non-tender, non-distended EXTREMITIES:  No edema; No deformity   ASSESSMENT AND PLAN:   Carotid stenosis:   She is status post carotid stenting.  She does have some residual stenosis.  She will remain on aspirin and Plavix for now.  She has follow-up in 6 months with [facility].  For this appointment I reviewed the [facility] records including the recent office visit.  Bradycardia: She has had no further symptomatic bradycardia arrhythmias.  Will watch this expectantly.  AS: This was moderate on echocardiogram.  No change in therapy.  I will follow-up with an echo in 1 year.   PAF:   She seems to be doing well on flecainide.  She does have her conduction disturbances but no other changes on the EKG.  She has a Youth worker.  No change in therapy.   Leg swelling:   She previously had a slight wound on her anterior pretibial area but I saw at the last appointment prescribed antibiotics and this seems to have resolved.     Follow up with me in six months.   Signed, Rollene Rotunda, MD

## 2023-05-04 ENCOUNTER — Encounter: Payer: Self-pay | Admitting: Family Medicine

## 2023-05-05 ENCOUNTER — Encounter: Payer: Self-pay | Admitting: Cardiology

## 2023-05-05 ENCOUNTER — Ambulatory Visit (INDEPENDENT_AMBULATORY_CARE_PROVIDER_SITE_OTHER): Payer: Medicare Other | Admitting: Cardiology

## 2023-05-05 VITALS — BP 158/70 | HR 61 | Ht 62.5 in | Wt 216.0 lb

## 2023-05-05 DIAGNOSIS — I480 Paroxysmal atrial fibrillation: Secondary | ICD-10-CM | POA: Diagnosis not present

## 2023-05-05 DIAGNOSIS — R001 Bradycardia, unspecified: Secondary | ICD-10-CM

## 2023-05-05 DIAGNOSIS — I350 Nonrheumatic aortic (valve) stenosis: Secondary | ICD-10-CM

## 2023-05-05 DIAGNOSIS — I6523 Occlusion and stenosis of bilateral carotid arteries: Secondary | ICD-10-CM

## 2023-05-05 NOTE — Patient Instructions (Signed)
Medication Instructions:  The current medical regimen is effective;  continue present plan and medications.  *If you need a refill on your cardiac medications before your next appointment, please call your pharmacy*  Follow-Up: At [facility], you and your health needs are our priority.  As part of our continuing mission to provide you with exceptional heart care, we have created designated Provider Care Teams.  These Care Teams include your primary Cardiologist (physician) and Advanced Practice Providers (APPs -  Physician Assistants and Nurse Practitioners) who all work together to provide you with the care you need, when you need it.  We recommend signing up for the patient portal called "MyChart".  Sign up information is provided on this After Visit Summary.  MyChart is used to connect with patients for Virtual Visits (Telemedicine).  Patients are able to view lab/test results, encounter notes, upcoming appointments, etc.  Non-urgent messages can be sent to your provider as well.   To learn more about what you can do with MyChart, go to [email or web address].    Your next appointment:   6 month(s)  Provider:   James Hochrein, MD    

## 2023-05-06 ENCOUNTER — Emergency Department (HOSPITAL_COMMUNITY): Payer: Medicare Other

## 2023-05-06 ENCOUNTER — Telehealth: Payer: Self-pay | Admitting: Cardiology

## 2023-05-06 ENCOUNTER — Emergency Department (HOSPITAL_COMMUNITY)
Admission: EM | Admit: 2023-05-06 | Discharge: 2023-05-06 | Disposition: A | Discharge status: Home / Self Care | Payer: Medicare Other | Attending: Emergency Medicine | Admitting: Emergency Medicine

## 2023-05-06 ENCOUNTER — Other Ambulatory Visit: Payer: Self-pay

## 2023-05-06 DIAGNOSIS — R000 Tachycardia, unspecified: Secondary | ICD-10-CM | POA: Diagnosis not present

## 2023-05-06 DIAGNOSIS — I4891 Unspecified atrial fibrillation: Secondary | ICD-10-CM

## 2023-05-06 DIAGNOSIS — Z95818 Presence of other cardiac implants and grafts: Secondary | ICD-10-CM | POA: Diagnosis not present

## 2023-05-06 DIAGNOSIS — I499 Cardiac arrhythmia, unspecified: Secondary | ICD-10-CM | POA: Diagnosis not present

## 2023-05-06 DIAGNOSIS — I350 Nonrheumatic aortic (valve) stenosis: Secondary | ICD-10-CM | POA: Diagnosis not present

## 2023-05-06 DIAGNOSIS — Z79899 Other long term (current) drug therapy: Secondary | ICD-10-CM

## 2023-05-06 DIAGNOSIS — M549 Dorsalgia, unspecified: Secondary | ICD-10-CM | POA: Insufficient documentation

## 2023-05-06 DIAGNOSIS — Z8719 Personal history of other diseases of the digestive system: Secondary | ICD-10-CM | POA: Diagnosis not present

## 2023-05-06 DIAGNOSIS — I050 Rheumatic mitral stenosis: Secondary | ICD-10-CM | POA: Diagnosis not present

## 2023-05-06 DIAGNOSIS — I480 Paroxysmal atrial fibrillation: Secondary | ICD-10-CM

## 2023-05-06 DIAGNOSIS — Z743 Need for continuous supervision: Secondary | ICD-10-CM | POA: Diagnosis not present

## 2023-05-06 LAB — CBC WITH DIFFERENTIAL/PLATELET
Abs Immature Granulocytes: 0.06 10*3/uL (ref 0.00–0.07)
Basophils Absolute: 0.1 10*3/uL (ref 0.0–0.1)
Basophils Relative: 1 %
Eosinophils Absolute: 0.1 10*3/uL (ref 0.0–0.5)
Eosinophils Relative: 1 %
HCT: 36.2 % (ref 36.0–46.0)
Hemoglobin: 11.4 g/dL — ABNORMAL LOW (ref 12.0–15.0)
Immature Granulocytes: 1 %
Lymphocytes Relative: 7 %
Lymphs Abs: 0.6 10*3/uL — ABNORMAL LOW (ref 0.7–4.0)
MCH: 28.4 pg (ref 26.0–34.0)
MCHC: 31.5 g/dL (ref 30.0–36.0)
MCV: 90 fL (ref 80.0–100.0)
Monocytes Absolute: 0.6 10*3/uL (ref 0.1–1.0)
Monocytes Relative: 7 %
Neutro Abs: 7.5 10*3/uL (ref 1.7–7.7)
Neutrophils Relative %: 83 %
Platelets: 212 10*3/uL (ref 150–400)
RBC: 4.02 MIL/uL (ref 3.87–5.11)
RDW: 15.9 % — ABNORMAL HIGH (ref 11.5–15.5)
WBC: 9 10*3/uL (ref 4.0–10.5)
nRBC: 0 % (ref 0.0–0.2)

## 2023-05-06 LAB — BASIC METABOLIC PANEL
Anion gap: 11 (ref 5–15)
BUN: 23 mg/dL (ref 8–23)
CO2: 22 mmol/L (ref 22–32)
Calcium: 8.8 mg/dL — ABNORMAL LOW (ref 8.9–10.3)
Chloride: 100 mmol/L (ref 98–111)
Creatinine, Ser: 0.68 mg/dL (ref 0.44–1.00)
GFR, Estimated: >60 mL/min (ref >60)
Glucose, Bld: 269 mg/dL — ABNORMAL HIGH (ref 70–99)
Potassium: 4.3 mmol/L (ref 3.5–5.1)
Sodium: 133 mmol/L — ABNORMAL LOW (ref 135–145)

## 2023-05-06 LAB — TROPONIN I (HIGH SENSITIVITY)
Troponin I (High Sensitivity): 20 ng/L — ABNORMAL HIGH (ref <18)
Troponin I (High Sensitivity): 21 ng/L — ABNORMAL HIGH (ref <18)

## 2023-05-06 LAB — MAGNESIUM: Magnesium: 2 mg/dL (ref 1.7–2.4)

## 2023-05-06 MED ORDER — FLECAINIDE ACETATE 50 MG PO TABS
100.0000 mg | ORAL_TABLET | Freq: Once | ORAL | Status: AC
  Administered 2023-05-06: 100 mg via ORAL
  Filled 2023-05-06: qty 2

## 2023-05-06 MED ORDER — APIXABAN 5 MG PO TABS
5.0000 mg | ORAL_TABLET | Freq: Two times a day (BID) | ORAL | Status: DC
  Administered 2023-05-06: 5 mg via ORAL
  Filled 2023-05-06: qty 1

## 2023-05-06 MED ORDER — DILTIAZEM HCL ER COATED BEADS 180 MG PO CP24
180.0000 mg | ORAL_CAPSULE | Freq: Once | ORAL | Status: AC
  Administered 2023-05-06: 180 mg via ORAL
  Filled 2023-05-06: qty 1

## 2023-05-06 MED ORDER — DILTIAZEM HCL-DEXTROSE 125-5 MG/125ML-% IV SOLN (PREMIX)
5.0000 mg/h | INTRAVENOUS | Status: DC
  Administered 2023-05-06: 5 mg/h via INTRAVENOUS
  Filled 2023-05-06: qty 125

## 2023-05-06 MED ORDER — METOPROLOL SUCCINATE ER 50 MG PO TB24: 50.0000 mg | ORAL_TABLET | Freq: Every evening | ORAL | 0 refills | Status: DC

## 2023-05-06 MED ORDER — DILTIAZEM HCL 25 MG/5ML IV SOLN
20.0000 mg | Freq: Once | INTRAVENOUS | Status: AC
  Administered 2023-05-06: 20 mg via INTRAVENOUS
  Filled 2023-05-06: qty 5

## 2023-05-06 NOTE — Discharge Instructions (Addendum)
You were evaluated in the Emergency Department and after careful evaluation, we did not find any emergent condition requiring admission or further testing in the hospital.  As you discussed with Dr. Odis Hollingshead, make sure to stay compliant with your medications.  Please follow-up with the A-fib clinic.  I have sent you a new prescription for your metoprolol 50 mg, take nightly.  Hold if you are systolic (top number) blood pressure is less than 100 or your heart rate is less than 50.  Please watch closely for signs of bleeding given that you were given a dose of Eliquis, please return to the ER if you have any episodes of vomiting blood, bloody or tarry stools, you feel weak or dizzy etc.  Thank you for allowing [place] to take care of you today

## 2023-05-06 NOTE — ED Provider Notes (Signed)
[place] EMERGENCY DEPARTMENT AT [facility] Provider Note   CSN: [id number] Arrival date & time: 05/06/23  8413     History  Chief Complaint  Patient presents with   Atrial Fibrillation    Emily [name] is a 72 y.o. female.  She has PMH of atrial fibrillation.  This started last night around 11:30 PM while she was watching TV.  She complains of pain in the center of her back.  She states this always happens when she has A-fib but also sometimes happens when she exerts herself.   She had a Watchman device.  She is on aspirin and Plavix due to transcarotid artery revascularization in October.  Patient took 30 mg of diltiazem when symptoms started without relief.  She denies chest pain or shortness of breath, denies fever chills or cough.  No other complaints.  Patient takes diltiazem 180 mg 24-hour capsule daily and 30 mg 3 times daily as needed, also takes 100 mg of flecainide twice daily and Toprol XL 100 mg daily.  Patient reports she has been cardioverted several times in the past.  States after her Watchman procedure they prepared to cardiovert her but she spontaneously converted back to sinus rhythm as they were wearing to sedate her.   Atrial Fibrillation       Home Medications Prior to Admission medications   Medication Sig Start Date End Date Taking? Authorizing Provider  Accu-Chek Softclix Lancets lancets Test BS daily Dx E11.69 03/02/23   Raliegh Ip, DO  aspirin EC 81 MG tablet Take 81 mg by mouth daily. 09/15/18   [provider]  bisacodyl (DULCOLAX) 5 MG EC tablet Take 5 mg by mouth daily as needed (constipation).    [provider]  clopidogrel (PLAVIX) 75 MG tablet Take 1 tablet (75 mg total) by mouth daily. 01/25/23   Nada Libman, MD  diltiazem (CARDIZEM CD) 180 MG 24 hr capsule TAKE 1 CAPSULE BY MOUTH EVERY DAY 02/24/23   Rollene Rotunda, MD  diltiazem (CARDIZEM) 30 MG tablet Take 30 mg by mouth 3 (three) times daily  as needed (afib).    [provider]  ezetimibe (ZETIA) 10 MG tablet TAKE 1 TABLET BY MOUTH EVERY DAY 02/24/23   Rollene Rotunda, MD  flecainide (TAMBOCOR) 100 MG tablet TAKE 1 TABLET BY MOUTH TWICE A DAY 12/31/22   Rollene Rotunda, MD  furosemide (LASIX) 20 MG tablet Take 1 tablet (20 mg total) by mouth daily as needed for fluid. 02/23/23 12/13/25  Daphine Deutscher Mary-Margaret, FNP  gabapentin (NEURONTIN) 300 MG capsule Take 1 capsule (300 mg total) by mouth 2 (two) times daily as needed (neuropathy right leg). 03/02/23   Raliegh Ip, DO  glipiZIDE (GLUCOTROL) 5 MG tablet Take 1 tablet (5 mg total) by mouth daily before breakfast. Dose reduction 03/02/23   Delynn Flavin M, DO  glucose blood (ACCU-CHEK GUIDE) test strip Test BS daily Dx E11.69 03/02/23   Raliegh Ip, DO  HYDROcodone-acetaminophen (NORCO) 5-325 MG tablet Take 1 tablet by mouth every 6 (six) hours as needed for severe pain (pain score 7-10). 03/20/23   Emilie Rutter, PA-C  levonorgestrel (MIRENA) 20 MCG/DAY IUD 1 each by Intrauterine route once.    [provider]  levothyroxine (SYNTHROID) 112 MCG tablet TAKE 1 TABLET BY MOUTH EVERY OTHER DAY, ALTERNATING WITH 1 & 1/2 TABLETS EVERY OTHER DAY 03/02/23   Delynn Flavin M, DO  linaclotide (LINZESS) 72 MCG capsule Take 72 mcg by mouth daily before breakfast. 01/04/23  [provider]  losartan (COZAAR) 25 MG tablet TAKE 1 TABLET (25 MG TOTAL) BY MOUTH DAILY. 11/04/22   Rollene Rotunda, MD  lovastatin (MEVACOR) 40 MG tablet TAKE 1 TABLET BY MOUTH EVERY DAY IN THE EVENING 05/13/22   Rollene Rotunda, MD  metFORMIN (GLUCOPHAGE) 1000 MG tablet Take 1 tablet (1,000 mg total) by mouth 2 (two) times daily with a meal. Patient taking differently: Take 500 mg by mouth 2 (two) times daily with a meal. 03/02/23   Gottschalk, Kathie Rhodes M, DO  metoprolol succinate (TOPROL-XL) 100 MG 24 hr tablet TAKE 1 TABLET BY MOUTH EVERY DAY WITH OR IMMEDIATELY FOLLOWING A MEAL Patient  taking differently: 50 mg daily. 03/29/23   Raliegh Ip, DO  psyllium (METAMUCIL) 58.6 % packet Take 1 packet by mouth every other day as needed (regularity).    [provider]  rOPINIRole (REQUIP) 0.5 MG tablet Take 1 tablet (0.5 mg total) by mouth at bedtime. 03/02/23   Raliegh Ip, DO  Semaglutide,0.25 or 0.5MG /DOS, 2 MG/3ML SOPN Inject 0.5 mg into the skin once a week. Patient taking differently: Inject 0.5 mg into the skin once a week. Takes on Friday 06/09/22   Raliegh Ip, DO      Allergies    Jardiance [empagliflozin], Quinapril hcl, Statins, and Tape    Review of Systems   Review of Systems  Physical Exam Updated Vital Signs BP 123/71   Pulse (!) 117   Temp (!) 97.3 F (36.3 C) (Oral)   Resp 19   Ht 5' 2.5" (1.588 m)   Wt 98 kg   SpO2 96%   BMI 38.88 kg/m  Physical Exam Vitals and nursing note reviewed.  Constitutional:      General: She is not in acute distress.    Appearance: She is well-developed.  HENT:     Head: Normocephalic and atraumatic.  Eyes:     Conjunctiva/sclera: Conjunctivae normal.  Cardiovascular:     Rate and Rhythm: Normal rate and regular rhythm.     Heart sounds: No murmur heard. Pulmonary:     Effort: Pulmonary effort is normal. No respiratory distress.     Breath sounds: Normal breath sounds.  Abdominal:     Palpations: Abdomen is soft.     Tenderness: There is no abdominal tenderness.  Musculoskeletal:        General: No swelling.     Cervical back: Neck supple.  Skin:    General: Skin is warm and dry.     Capillary Refill: Capillary refill takes less than 2 seconds.  Neurological:     General: No focal deficit present.     Mental Status: She is alert and oriented to person, place, and time.  Psychiatric:        Mood and Affect: Mood normal.     ED Results / Procedures / Treatments   Labs (all labs ordered are listed, but only abnormal results are displayed) Labs Reviewed  CBC WITH  DIFFERENTIAL/PLATELET - Abnormal; Notable for the following components:      Result Value   Hemoglobin 11.4 (*)    RDW 15.9 (*)    Lymphs Abs 0.6 (*)    All other components within normal limits  BASIC METABOLIC PANEL - Abnormal; Notable for the following components:   Sodium 133 (*)    Glucose, Bld 269 (*)    Calcium 8.8 (*)    All other components within normal limits  TROPONIN I (HIGH SENSITIVITY) - Abnormal; Notable for  the following components:   Troponin I (High Sensitivity) 20 (*)    All other components within normal limits  TROPONIN I (HIGH SENSITIVITY) - Abnormal; Notable for the following components:   Troponin I (High Sensitivity) 21 (*)    All other components within normal limits  MAGNESIUM    EKG EKG Interpretation Date/Time:  Thursday May 06 2023 02:21:31 EST Ventricular Rate:  120 PR Interval:    QRS Duration:  186 QT Interval:  400 QTC Calculation: 566 R Axis:   -80  Text Interpretation: Atrial flutter with predominant 2:1 AV block Right bundle branch block LVH with IVCD and secondary repol abnrm Prolonged QT interval Confirmed by Alvester Chou [phone]) on 05/06/2023 2:42:08 AM  Radiology DG Chest 2 View  Result Date: 05/06/2023 CLINICAL DATA:  Atrial fibrillation. EXAM: CHEST - 2 VIEW COMPARISON:  08/30/2020 FINDINGS: Cardiopericardial silhouette is at upper limits of normal for size. The lungs are clear without focal pneumonia, edema, pneumothorax or pleural effusion. Linear scarring noted left mid lung. No acute bony abnormality. Telemetry leads overlie the chest. IMPRESSION: No active cardiopulmonary disease. Electronically Signed   By: Kennith Center M.D.   On: 05/06/2023 05:19    Procedures Procedures    Medications Ordered in ED Medications  diltiazem (CARDIZEM) 125 mg in dextrose 5% 125 mL (1 mg/mL) infusion (5 mg/hr Intravenous New Bag/Given 05/06/23 0520)  diltiazem (CARDIZEM) injection 20 mg (20 mg Intravenous Given 05/06/23 0316)    ED  Course/ Medical Decision Making/ A&P Clinical Course as of 05/06/23 0703  Thu May 06, 2023  02555 This is 72 year old female with a history of Watchman procedure, coronary disease, on metoprolol and flecainide and diltiazem for paroxysmal A-fib, presented to the ED with concern for recurring A-fib.  Patient reports that she felt her cervical back in A-fib earlier this evening.  She does feel that she missed a dose of her evening antiarrhythmic medications yesterday, but otherwise has been compliant with her meds.  She reports palpitations and pressure between her shoulder blades which she says is "typical for my A-fib" but also reports that she is sometimes as pressure within her shoulder blades with exertion.  On arrival the patient is EKG shows atrial fibrillation with LBBB pattern, not meeting Sgarbossa criteria for MI.  Blood pressure is normal and stable.  We will give a 20 mg IV diltiazem bolus as a push dose, as a push dose, check patient's labs, electrolyte levels.  Potential cardioversion in ED, will discuss with cardiology after labs result. [MT]  [phone] Patient presents in A-fib with RVR, given diltiazem with improvement of right but still in A-fib still having some symptoms.  Cardiology paged at 4:40 AM awaiting callback at this time.  Troponin is 20, EKG does not show acute ischemia, likely slightly elevated due to demand ischemia from increased rate [CB]    Clinical Course User Index [CB] Ma Rings, PA-C [MT] Terald Sleeper, MD                                 Medical Decision Making This patient presents to the ED for concern of afib, this involves an extensive number of treatment options, and is a complaint that carries with it a high risk of complications and morbidity.  The differential diagnosis includes A-fib with RVR, ACS, electrolyte abnormality, other   Co morbidities that complicate the patient evaluation :   A-fib, history of Watchman  device, carotid artery  disease,   Additional history obtained:  Additional history obtained from EMR External records from outside source obtained and reviewed including prior notes   Lab Tests:  I Ordered, and personally interpreted labs.  The pertinent results include: Troponin delta of 1, CBC has no leukocytosis, hemoglobin baseline 11.4, BMP shows glucose elevated at 269, normal anion gap, normal renal function   Imaging Studies ordered:  I ordered imaging studies including chest x-ray which shows no pulmonary edema or infiltrate I independently visualized and interpreted imaging within scope of identifying emergent findings  I agree with the radiologist interpretation   Cardiac Monitoring: / EKG:  The patient was maintained on a cardiac monitor.  I personally viewed and interpreted the cardiac monitored which showed an underlying rhythm of: Atrial fibrillation with rapid ventricular response   Consultations Obtained:  I requested consultation with the on-call cardiologist Dr. Odis Hollingshead,  and discussed lab and imaging findings as well as pertinent plan - they recommend: Starting patient on diltiazem drip, he is going to review patient's chart to see if she be appropriate for cardioversion potentially and he will call back   Problem List / ED Course / Critical interventions / Medication management  A Fib with RVR-patient does not convert with diltiazem bolus, started on diltiazem drip per recommendation from Dr. Odis Hollingshead.  Has history of Watchman procedure but do not have any documentation on imaging that the watchman is sealed, so he is going to further evaluate patient's chart and will call back with recommendations.  Patient is on diltiazem drip, had some improvement of symptoms but still tachycardic.  She was having some upper back pain so troponins were ordered and delta is 1, with no concerning EKG findings so do not feel this represents ACS.  Is hemodynamically stable. Signed out to Trudee Grip,  PA-C.  I ordered medication including diltiazem  for control of heart rate  Reevaluation of the patient after these medicines showed that the patient improved I have reviewed the patients home medicines and have made adjustments as needed       Amount and/or Complexity of Data Reviewed Labs: ordered. Radiology: ordered.  Risk Prescription drug management.           Final Clinical Impression(s) / ED Diagnoses Final diagnoses:  None    Rx / DC Orders ED Discharge Orders     None         Ma Rings, PA-C 05/06/23 0703    Terald Sleeper, MD 05/06/23 [phone]

## 2023-05-06 NOTE — ED Triage Notes (Signed)
BIB EMS - Hx of Afib, tool 30 mg Cardizem prior to calling 911. HR 120s-140s with EMS. Denies CP/SOB. Pain between shoulder blades.

## 2023-05-06 NOTE — Consult Note (Signed)
Cardiology Consultation   Patient ID: Emily [name] MRN: [id number]; DOB: Mar 12, 1951  Admit date: 05/06/2023 Date of Consult: 05/06/2023  PCP:  Raliegh Ip, DO    [facility] Providers Cardiologist:  Rollene Rotunda, MD  Electrophysiologist:  Regan Lemming, MD       Patient Profile:   Emily [name] is a 72 y.o. female with a hx of paroxysmal atrial fibrillation, long-term antiarrhythmic medications, history of GI bleed status post watchman implant, history of AVMs, mild mitral stenosis, moderate aortic stenosis, carotid artery disease status post stenting who is being seen 05/06/2023 for the evaluation of symptomatic atrial fibrillation at the request of Carmel [place] A, PA-C. Marland Kitchen  History of Present Illness:   [name] a very pleasant 72 year old Caucasian female who follows with Dr. Antoine Poche given her cardiovascular comorbidities as noted above.  She presents to the hospital due to symptomatic A-fib with RVR.  Patient was seen by her primary cardiologist Dr. Antoine Poche yesterday in the office and she was doing well.  Prior to her recent carotid stenting she had an episode of bradycardia and her Toprol-XL 100 mg p.o. daily was reduced to 50 mg p.o. daily.  In addition due to medication mismanagement she forgot to take her regular dose of diltiazem 180 mg p.o. every morning for the last 2 days.  She presented to the hospital via EMS earlier this morning with A-fib with RVR.  Cardiology was consulted for further management.  Recommended starting her on diltiazem drip for rate control strategy to see if she spontaneously converts.  She is not on oral anticoagulation given her history of watchman implant back in 2020.  At the time of my evaluation patient had spontaneously converted back to normal sinus rhythm.  Clinically denies any anginal chest pain or heart failure symptoms.  And is eager to go home.  Past Medical History:  Diagnosis Date   Anemia     Arthritis    Atrial fibrillation, rapid (HCC) 10/27/2013   Bilateral carotid artery disease (HCC)    Complication of anesthesia    difficult intubation in past   Diabetes mellitus without complication (HCC)    type 2   Difficult intubation    states 'lady that did the sleep study told me I have the smallest airway she has ever seen in an adult"   Dyslipidemia 06/29/2017   Heart murmur    History of blood transfusion 09/2019   GI bleed - in [facility]   Hypertension    Hypothyroid    Obesity    Obstructive sleep apnea    occasional uses CPAP   Peripheral vascular disease (HCC)    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" - xrays were negative   Upper GI bleed 09/25/2019    Past Surgical History:  Procedure Laterality Date   CATARACT EXTRACTION Left    CATARACT EXTRACTION W/PHACO Right 12/09/2015   Procedure: CATARACT EXTRACTION PHACO AND INTRAOCULAR LENS PLACEMENT (IOC);  Surgeon: Gemma Payor, MD;  Location: [facility];  Service: Ophthalmology;  Laterality: Right;  CDE:10.48   COLONOSCOPY W/ POLYPECTOMY     cyst on back of neck removed     DILATION AND CURETTAGE OF UTERUS     x 5   ENDARTERECTOMY Left 08/30/2015   Procedure: LEFT CAROYID ENDARTERECTOMY WITH XENOSURE BOVINE PERICARDIUM PATCH ANGIOPLASTY;  Surgeon: Nada Libman, MD;  Location: [facility];  Service: Vascular;  Laterality: Left;   TONSILLECTOMY     TRANSCAROTID ARTERY REVASCULARIZATION  Right 03/19/2023   Procedure: Right Transcarotid Artery Revascularization;  Surgeon: Nada Libman, MD;  Location: [facility] OR;  Service: Vascular;  Laterality: Right;   ULTRASOUND GUIDANCE FOR VASCULAR ACCESS Left 03/19/2023   Procedure: ULTRASOUND GUIDANCE FOR VASCULAR ACCESS;  Surgeon: Nada Libman, MD;  Location: [facility] OR;  Service: Vascular;  Laterality: Left;   UPPER GI ENDOSCOPY     x several     Home Medications:  Prior to Admission medications   Medication Sig Start Date End Date Taking? Authorizing Provider   bisacodyl (DULCOLAX) 5 MG EC tablet Take 5 mg by mouth daily as needed (constipation).   Yes [provider]  clopidogrel (PLAVIX) 75 MG tablet Take 1 tablet (75 mg total) by mouth daily. 01/25/23  Yes Nada Libman, MD  diltiazem (CARDIZEM CD) 180 MG 24 hr capsule TAKE 1 CAPSULE BY MOUTH EVERY DAY 02/24/23  Yes Rollene Rotunda, MD  diltiazem (CARDIZEM) 30 MG tablet Take 30 mg by mouth 3 (three) times daily as needed (afib).   Yes [provider]  diphenhydramine-acetaminophen (TYLENOL PM) 25-500 MG TABS tablet Take 1 tablet by mouth at bedtime as needed (Pain).   Yes [provider]  ezetimibe (ZETIA) 10 MG tablet TAKE 1 TABLET BY MOUTH EVERY DAY 02/24/23  Yes Hochrein, Fayrene Fearing, MD  flecainide (TAMBOCOR) 100 MG tablet TAKE 1 TABLET BY MOUTH TWICE A DAY 12/31/22  Yes Rollene Rotunda, MD  furosemide (LASIX) 20 MG tablet Take 1 tablet (20 mg total) by mouth daily as needed for fluid. 02/23/23 12/13/25 Yes Martin, Mary-Margaret, FNP  gabapentin (NEURONTIN) 300 MG capsule Take 1 capsule (300 mg total) by mouth 2 (two) times daily as needed (neuropathy right leg). 03/02/23  Yes Gottschalk, Ashly M, DO  glipiZIDE (GLUCOTROL) 5 MG tablet Take 1 tablet (5 mg total) by mouth daily before breakfast. Dose reduction 03/02/23  Yes Gottschalk, Ashly M, DO  HYDROcodone-acetaminophen (NORCO) 5-325 MG tablet Take 1 tablet by mouth every 6 (six) hours as needed for severe pain (pain score 7-10). 03/20/23  Yes Emilie Rutter, PA-C  levonorgestrel (MIRENA) 20 MCG/DAY IUD 1 each by Intrauterine route once.   Yes [provider]  levothyroxine (SYNTHROID) 112 MCG tablet TAKE 1 TABLET BY MOUTH EVERY OTHER DAY, ALTERNATING WITH 1 & 1/2 TABLETS EVERY OTHER DAY 03/02/23  Yes Gottschalk, Ashly M, DO  losartan (COZAAR) 25 MG tablet TAKE 1 TABLET (25 MG TOTAL) BY MOUTH DAILY. 11/04/22  Yes Rollene Rotunda, MD  lovastatin (MEVACOR) 40 MG tablet TAKE 1 TABLET BY MOUTH EVERY DAY IN THE EVENING 05/13/22   Yes Rollene Rotunda, MD  metFORMIN (GLUCOPHAGE) 1000 MG tablet Take 1 tablet (1,000 mg total) by mouth 2 (two) times daily with a meal. Patient taking differently: Take 500 mg by mouth 2 (two) times daily with a meal. 03/02/23  Yes Gottschalk, Ashly M, DO  metoprolol succinate (TOPROL-XL) 100 MG 24 hr tablet TAKE 1 TABLET BY MOUTH EVERY DAY WITH OR IMMEDIATELY FOLLOWING A MEAL Patient taking differently: Take 50 mg by mouth daily. 03/29/23  Yes Gottschalk, Kathie Rhodes M, DO  polyethylene glycol (MIRALAX / GLYCOLAX) 17 g packet Take 17 g by mouth daily as needed for moderate constipation.   Yes [provider]  psyllium (METAMUCIL) 58.6 % packet Take 1 packet by mouth daily as needed (Constipation).   Yes [provider]  rOPINIRole (REQUIP) 0.5 MG tablet Take 1 tablet (0.5 mg total) by mouth at bedtime. 03/02/23  Yes Raliegh Ip, DO  Semaglutide,0.25  or 0.5MG /DOS, 2 MG/3ML SOPN Inject 0.5 mg into the skin once a week. 06/09/22  Yes Delynn Flavin M, DO  Accu-Chek Softclix Lancets lancets Test BS daily Dx E11.69 03/02/23   Raliegh Ip, DO  aspirin EC 81 MG tablet Take 81 mg by mouth daily. Patient not taking: Reported on 05/06/2023 09/15/18   [provider]  glucose blood (ACCU-CHEK GUIDE) test strip Test BS daily Dx E11.69 03/02/23   Raliegh Ip, DO  linaclotide Karlene Einstein) 72 MCG capsule Take 72 mcg by mouth daily before breakfast. Patient not taking: Reported on 05/06/2023 01/04/23   [provider]    Inpatient Medications: Scheduled Meds:  cyanocobalamin  1,000 mcg Intramuscular Q30 days   Continuous Infusions:  diltiazem (CARDIZEM) infusion 5 mg/hr (05/06/23 0758)   PRN Meds:   Allergies:    Allergies  Allergen Reactions   Jardiance [Empagliflozin] Other (See Comments)    RECURRENT VAGINITIS   Quinapril Hcl Cough   Statins Other (See Comments)    Not all Statins but some cause cough and pain in legs.   Tape Other (See Comments)     Redness, please use "paper" tape    Social History:   Social History   Socioeconomic History   Marital status: Single    Spouse name: Not on file   Number of children: Not on file   Years of education: Not on file   Highest education level: Not on file  Occupational History   Not on file  Tobacco Use   Smoking status: Never    Passive exposure: Never   Smokeless tobacco: Never  Vaping Use   Vaping status: Never Used  Substance and Sexual Activity   Alcohol use: No    Alcohol/week: 0.0 standard drinks of alcohol   Drug use: Never   Sexual activity: Not Currently    Birth control/protection: Post-menopausal  Other Topics Concern   Not on file  Social History Narrative   Not on file   Social Determinants of Health   Financial Resource Strain: Low Risk  (04/21/2022)   Overall Financial Resource Strain (CARDIA)    Difficulty of Paying Living Expenses: Not hard at all  Food Insecurity: No Food Insecurity (03/22/2023)   Hunger Vital Sign    Worried About Running Out of Food in the Last Year: Never true    Ran Out of Food in the Last Year: Never true  Transportation Needs: No Transportation Needs (03/22/2023)   PRAPARE - Administrator, Civil Service (Medical): No    Lack of Transportation (Non-Medical): No  Physical Activity: Inactive (04/21/2022)   Exercise Vital Sign    Days of Exercise per Week: 0 days    Minutes of Exercise per Session: 0 min  Stress: No Stress Concern Present (04/21/2022)   [employer] of Occupational Health - Occupational Stress Questionnaire    Feeling of Stress : Not at all  Social Connections: Moderately Integrated (04/21/2022)   Social Connection and Isolation Panel [NHANES]    Frequency of Communication with Friends and Family: More than three times a week    Frequency of Social Gatherings with Friends and Family: Twice a week    Attends Religious Services: More than 4 times per year    Active Member of [employer] or  Organizations: Yes    Attends Banker Meetings: More than 4 times per year    Marital Status: Never married  Intimate Partner Violence: Not At Risk (03/19/2023)   Humiliation, Afraid,  Rape, and Kick questionnaire    Fear of Current or Ex-Partner: No    Emotionally Abused: No    Physically Abused: No    Sexually Abused: No    Family History:   Family History  Problem Relation Age of Onset   COPD Father    Heart failure Father    Heart disease Father    Emphysema Father    Arrhythmia Sister    Arrhythmia Sister    Arrhythmia Sister        had PPM also   Cancer Sister      ROS:  Review of Systems  Cardiovascular:  Negative for chest pain, claudication, irregular heartbeat, leg swelling, near-syncope, orthopnea, palpitations, paroxysmal nocturnal dyspnea and syncope.  Respiratory:  Negative for shortness of breath.   Hematologic/Lymphatic: Negative for bleeding problem.   Physical Exam/Data:   Vitals:   05/06/23 0700 05/06/23 0748 05/06/23 0754 05/06/23 0843  BP: (!) 102/55   (!) 103/44  Pulse: 66  60 62  Resp: 16  16 15   Temp:  98.1 F (36.7 C)    TempSrc:  Oral    SpO2: 93%  95% 99%  Weight:      Height:       No intake or output data in the 24 hours ending 05/06/23 0947    05/06/2023    2:22 AM 05/05/2023    9:20 AM 04/26/2023    9:17 AM  Last 3 Weights  Weight (lbs) 216 lb 216 lb 218 lb  Weight (kg) 97.977 kg 97.977 kg 98.884 kg     Body mass index is 38.88 kg/m.  General:  Well nourished, well developed, in no acute distress HEENT: normal Neck: no JVD Vascular: No carotid bruits; Distal pulses 2+ bilaterally Cardiac:  normal S1, S2; RRR; 3 out of 6 systolic ejection murmur heard at the second intercostal space, no gallops or rubs. Lungs:  clear to auscultation bilaterally, no wheezing, rhonchi or rales  Abd: soft, nontender, no hepatomegaly  Ext: no edema Musculoskeletal:  No deformities, BUE and BLE strength normal and equal Skin: warm and  dry  Neuro:  CNs 2-12 intact, no focal abnormalities noted Psych:  Normal affect   EKG:  The EKG was personally reviewed and demonstrates:   05/05/2023: Sinus rhythm, 61 bpm, right bundle branch block, left anterior fascicular block, left axis. 05/06/2023: Atrial fibrillation, 120 bpm, right bundle branch block. 05/06/2023: Sinus rhythm, 60 bpm, right bundle branch block, inferior and lateral ST-T changes cannot rule out ischemia.  Telemetry:  Telemetry was personally reviewed and demonstrates:  Afib w/ RVR but now NSR   Relevant CV Studies: Echocardiogram September 2024:  1. Left ventricular ejection fraction, by estimation, is 70 to 75%. The  left ventricle has hyperdynamic function. The left ventricle has no  regional wall motion abnormalities. There is mild left ventricular  hypertrophy. Left ventricular diastolic  parameters are consistent with Grade II diastolic dysfunction  (pseudonormalization).   2. Right ventricular systolic function is normal. The right ventricular  size is normal.   3. Left atrial size was moderately dilated.   4. The mitral valve is normal in structure. Mild mitral valve  regurgitation. Mild mitral stenosis. The mean mitral valve gradient is 3.0  mmHg. Severe mitral annular calcification.   5. The aortic valve is normal in structure. Aortic valve regurgitation is  not visualized. Moderate aortic valve stenosis. Aortic valve area, by VTI  measures 1.22 cm. Aortic valve mean gradient measures 24.2 mmHg. Aortic  valve Vmax measures 3.26 m/s.   6. The inferior vena cava is dilated in size with >50% respiratory  variability, suggesting right atrial pressure of 8 mmHg.   Laboratory Data:  High Sensitivity Troponin:   Recent Labs  Lab 05/06/23 0303 05/06/23 0519  TROPONINIHS 20* 21*     Chemistry Recent Labs  Lab 05/06/23 0519  NA 133*  K 4.3  CL 100  CO2 22  GLUCOSE 269*  BUN 23  CREATININE 0.68  CALCIUM 8.8*  MG 2.0  GFRNONAA >60  ANIONGAP  11    No results for input(s): "PROT", "ALBUMIN", "AST", "ALT", "ALKPHOS", "BILITOT" in the last 168 hours. Lipids No results for input(s): "CHOL", "TRIG", "HDL", "LABVLDL", "LDLCALC", "CHOLHDL" in the last 168 hours.  Hematology Recent Labs  Lab 05/06/23 0303  WBC 9.0  RBC 4.02  HGB 11.4*  HCT 36.2  MCV 90.0  MCH 28.4  MCHC 31.5  RDW 15.9*  PLT 212   Thyroid No results for input(s): "TSH", "FREET4" in the last 168 hours.  BNPNo results for input(s): "BNP", "PROBNP" in the last 168 hours.  DDimer No results for input(s): "DDIMER" in the last 168 hours.   Radiology/Studies:  DG Chest 2 View  Result Date: 05/06/2023 CLINICAL DATA:  Atrial fibrillation. EXAM: CHEST - 2 VIEW COMPARISON:  08/30/2020 FINDINGS: Cardiopericardial silhouette is at upper limits of normal for size. The lungs are clear without focal pneumonia, edema, pneumothorax or pleural effusion. Linear scarring noted left mid lung. No acute bony abnormality. Telemetry leads overlie the chest. IMPRESSION: No active cardiopulmonary disease. Electronically Signed   By: Kennith Center M.D.   On: 05/06/2023 05:19     Assessment and Plan:   Atrial fibrillation with rapid ventricular rate-spontaneously converted to sinus rhythm with IV Cardizem drip. Paroxysmal atrial fibrillation. Her recent episode of A-fib with RVR likely precipitated by medication mismanagement.  She forgot to take her maintenance dose of diltiazem 100 mg p.o. daily for the last 2 days.  And due to recent bradycardia her Toprol-XL dose was also reduced from 100 mg p.o. daily to 50 mg p.o. daily. Started on Cardizem drip while in the ER and she converted to sinus rhythm. Recommended following medication changes/reconciliation to the patient as well as the ED provider. Toprol-XL 50 mg p.o. every afternoon-new prescription Diltiazem 180 mg p.o. every morning Flecainide 100 mg p.o. twice daily Not on oral anticoagulation status post watchman  implant. Postconversion EKG notes sinus rhythm with ST-T changes likely secondary to recent A-fib with RVR.  Clinically denies anginal chest pain.  Monitor for now  Long-term antiarrhythmic medications. Indication: Paroxysmal atrial fibrillation.  Continue flecainide at her home dose. CHA2DS2-VASc score 4, indicating 4.8 annual risk of stroke  Status post watchman implant secondary to GI bleed due to AVMs.  Moderate aortic stenosis. She is followed clinically by her primary cardiologist who plans to repeat an echocardiogram in 1 year. She does not endorse syncope, heart failure or anginal chest pain at this time.  Mild mitral stenosis: Monitor clinically  History of carotid disease status post stenting Follows with vascular and vein. Continue antiplatelet therapy as advised by her other providers.  Patient can be discharged home from a cardiovascular standpoint.  Will arrange outpatient follow-up with Dr. Ollen Bowl APP in the next 2 to 4 weeks to reevaluate A-fib management and rate control/rhythm control strategy.  Recommendations conveyed to the patient as well as ED provider.  As part of this consultation reviewed documentations from primary cardiology dating  12//2024, transesophageal echocardiogram report from [facility] from 2020, multiple EKG tracings, laboratory values, coordination of care between ED physician, ED extender, and patient.  Risk Assessment/Risk Scores:        For questions or updates, please contact [facility] Please consult [email or web address] for contact info under    Signed, Tessa Lerner, DO  05/06/2023 9:47 AM

## 2023-05-06 NOTE — Telephone Encounter (Signed)
Called patient with no answer. Left vm to call back and schedule 4 week follow up per ED.

## 2023-05-06 NOTE — ED Provider Notes (Signed)
Patient received in signout from [facility]. Please see her note for full HPI.   Patient presented with A-fib RVR s/p Watchman device.  Signed out pending cardiology evaluation for possible cardioversion.  Discussed with Dr. Odis Hollingshead, initial plan was for initiation of Eliquis and continuing Cardizem gtt., however upon my reevaluation the patient was in normal sinus rhythm with a heart rate in the 50s/60s.  Discussed with Dr. Alvino Chapel again, who will come see her in consult but likely will be stable for discharge with outpatient follow-up without initiation of Eliquis.  9:30 AM: Pt received 1 time dose of Eliquis prior to discussion with Dr. Odis Hollingshead, will cancel additional Eliquis  10:15AM: Seen  by Dr. Odis Hollingshead at bedside, will give home Cardizem 180mg  dose and Flecanide, send home with Toprol XL 50mg  with hold parameters. Remains sinus rhythm. Pt ok to be discharge per Dr. Odis Hollingshead. Given strict return precautions for signs of bleeding given she was given 1 Eliquis dose     Results for orders placed or performed during the hospital encounter of 05/06/23  CBC with Differential  Result Value Ref Range   WBC 9.0 4.0 - 10.5 K/uL   RBC 4.02 3.87 - 5.11 MIL/uL   Hemoglobin 11.4 (L) 12.0 - 15.0 g/dL   HCT 16.1 09.6 - 04.5 %   MCV 90.0 80.0 - 100.0 fL   MCH 28.4 26.0 - 34.0 pg   MCHC 31.5 30.0 - 36.0 g/dL   RDW 40.9 (H) 81.1 - 91.4 %   Platelets 212 150 - 400 K/uL   nRBC 0.0 0.0 - 0.2 %   Neutrophils Relative % 83 %   Neutro Abs 7.5 1.7 - 7.7 K/uL   Lymphocytes Relative 7 %   Lymphs Abs 0.6 (L) 0.7 - 4.0 K/uL   Monocytes Relative 7 %   Monocytes Absolute 0.6 0.1 - 1.0 K/uL   Eosinophils Relative 1 %   Eosinophils Absolute 0.1 0.0 - 0.5 K/uL   Basophils Relative 1 %   Basophils Absolute 0.1 0.0 - 0.1 K/uL   Immature Granulocytes 1 %   Abs Immature Granulocytes 0.06 0.00 - 0.07 K/uL  Basic metabolic panel  Result Value Ref Range   Sodium 133 (L) 135 - 145 mmol/L   Potassium 4.3 3.5 - 5.1 mmol/L    Chloride 100 98 - 111 mmol/L   CO2 22 22 - 32 mmol/L   Glucose, Bld 269 (H) 70 - 99 mg/dL   BUN 23 8 - 23 mg/dL   Creatinine, Ser 7.82 0.44 - 1.00 mg/dL   Calcium 8.8 (L) 8.9 - 10.3 mg/dL   GFR, Estimated >95 >62 mL/min   Anion gap 11 5 - 15  Magnesium  Result Value Ref Range   Magnesium 2.0 1.7 - 2.4 mg/dL  Troponin I (High Sensitivity)  Result Value Ref Range   Troponin I (High Sensitivity) 20 (H) <18 ng/L  Troponin I (High Sensitivity)  Result Value Ref Range   Troponin I (High Sensitivity) 21 (H) <18 ng/L   DG Chest 2 View  Result Date: 05/06/2023 CLINICAL DATA:  Atrial fibrillation. EXAM: CHEST - 2 VIEW COMPARISON:  08/30/2020 FINDINGS: Cardiopericardial silhouette is at upper limits of normal for size. The lungs are clear without focal pneumonia, edema, pneumothorax or pleural effusion. Linear scarring noted left mid lung. No acute bony abnormality. Telemetry leads overlie the chest. IMPRESSION: No active cardiopulmonary disease. Electronically Signed   By: Kennith Center M.D.   On: 05/06/2023 05:19   VAS US CAROTID  Result Date: 04/26/2023 Carotid Arterial Duplex Study Patient Name:  Emily [name]  Date of Exam:   04/26/2023 Medical Rec #: [id number]         Accession #:    [id number] Date of Birth: 10/02/1950         Patient Gender: F Patient Age:   72 years Exam Location:  [facility] Procedure:      VAS US CAROTID Referring Phys: Lelon Mast RHYNE --------------------------------------------------------------------------------  Indications: Carotid artery disease and Right stent.              Right TCAR 03/19/2023 Performing Technologist: Dorthula Matas RVS, RCS  Examination Guidelines: A complete evaluation includes B-mode imaging, spectral Doppler, color Doppler, and power Doppler as needed of all accessible portions of each vessel. Bilateral testing is considered an integral part of a complete examination. Limited examinations for reoccurring indications may be  performed as noted.  Right Carotid Findings: +----------+--------+--------+--------+------------------+--------+           PSV cm/sEDV cm/sStenosisPlaque DescriptionComments +----------+--------+--------+--------+------------------+--------+ CCA Prox  75      13                                         +----------+--------+--------+--------+------------------+--------+ CCA Mid   69      15              heterogenous               +----------+--------+--------+--------+------------------+--------+ CCA Distal                                          stent    +----------+--------+--------+--------+------------------+--------+ ICA Prox                                            stent    +----------+--------+--------+--------+------------------+--------+ ICA Mid                                             stent    +----------+--------+--------+--------+------------------+--------+ ICA Distal108     22                                         +----------+--------+--------+--------+------------------+--------+ ECA       176                                                +----------+--------+--------+--------+------------------+--------+ +----------+--------+-------+----------------+-------------------+           PSV cm/sEDV cmsDescribe        Arm Pressure (mmHG) +----------+--------+-------+----------------+-------------------+ [id number]            Multiphasic, [id number]                 +----------+--------+-------+----------------+-------------------+ +---------+--------+---+--------+--+---------+ VertebralPSV cm/s177EDV cm/s36Antegrade +---------+--------+---+--------+--+---------+  Right Stent(s): +---------------+--------+--------+---------------+--------+----------------+ ICA            PSV cm/sEDV cm/sStenosis  WaveformComments         +---------------+--------+--------+---------------+--------+----------------+ Prox to Stent  69       15                                              +---------------+--------+--------+---------------+--------+----------------+ Proximal Stent 153     36                                              +---------------+--------+--------+---------------+--------+----------------+ Mid Stent      158     46      50-75% stenosis        low end of range +---------------+--------+--------+---------------+--------+----------------+ Distal Stent   168     37                                              +---------------+--------+--------+---------------+--------+----------------+ Distal to Stent108     22                                              +---------------+--------+--------+---------------+--------+----------------+   Left Carotid Findings: +----------+--------+--------+--------+------------------+--------+           PSV cm/sEDV cm/sStenosisPlaque DescriptionComments +----------+--------+--------+--------+------------------+--------+ CCA Prox  89      19                                         +----------+--------+--------+--------+------------------+--------+ CCA Mid   85      20              heterogenous               +----------+--------+--------+--------+------------------+--------+ CCA Distal111     25              calcific                   +----------+--------+--------+--------+------------------+--------+ ICA Prox  88      23      1-39%   heterogenous               +----------+--------+--------+--------+------------------+--------+ ICA Mid   111     30                                         +----------+--------+--------+--------+------------------+--------+ ICA Distal136     32                                         +----------+--------+--------+--------+------------------+--------+ ECA       115                                                +----------+--------+--------+--------+------------------+--------+  +----------+--------+--------+----------------+-------------------+  PSV cm/sEDV cm/sDescribe        Arm Pressure (mmHG) +----------+--------+--------+----------------+-------------------+ [id number]             Multiphasic, [id number]                 +----------+--------+--------+----------------+-------------------+ +---------+--------+--+--------+---------+ VertebralPSV cm/s24EDV cm/sAntegrade +---------+--------+--+--------+---------+   Summary: Right Carotid: Right ICA stent velocities suggest a 50-75% stenosis. Left Carotid: Velocities in the left ICA are consistent with a 1-39% stenosis. Vertebrals:  Bilateral vertebral arteries demonstrate antegrade flow. Subclavians: Normal flow hemodynamics were seen in bilateral subclavian              arteries. *See table(s) above for measurements and observations.  Electronically signed by Carolynn Sayers on 04/26/2023 at 10:48:33 AM.    Final       Mare Ferrari, PA-C 05/12/23 2232    Cathren Laine, MD 05/20/23 [phone]

## 2023-05-06 NOTE — Telephone Encounter (Signed)
Hey this person was DC before I could schedule follow up, could you please call patient and schedule f/u in about 4 weeks with APP please?

## 2023-05-07 NOTE — Telephone Encounter (Signed)
Patient returned call and was scheduled for 06/03/23 with Bradd Burner, NP at 10:55 am.

## 2023-05-07 NOTE — Telephone Encounter (Signed)
 Called patient with no answer. Left message to call back

## 2023-05-11 ENCOUNTER — Other Ambulatory Visit: Payer: Self-pay | Admitting: Cardiology

## 2023-05-17 ENCOUNTER — Other Ambulatory Visit: Payer: Self-pay | Admitting: Nurse Practitioner

## 2023-05-18 ENCOUNTER — Ambulatory Visit (INDEPENDENT_AMBULATORY_CARE_PROVIDER_SITE_OTHER): Payer: Medicare Other | Admitting: Family Medicine

## 2023-05-18 ENCOUNTER — Encounter: Payer: Self-pay | Admitting: Family Medicine

## 2023-05-18 VITALS — BP 139/62 | HR 61 | Temp 98.5°F | Ht 62.0 in | Wt 217.0 lb

## 2023-05-18 DIAGNOSIS — E1159 Type 2 diabetes mellitus with other circulatory complications: Secondary | ICD-10-CM

## 2023-05-18 DIAGNOSIS — Z0001 Encounter for general adult medical examination with abnormal findings: Secondary | ICD-10-CM | POA: Diagnosis not present

## 2023-05-18 DIAGNOSIS — E785 Hyperlipidemia, unspecified: Secondary | ICD-10-CM | POA: Diagnosis not present

## 2023-05-18 DIAGNOSIS — I152 Hypertension secondary to endocrine disorders: Secondary | ICD-10-CM | POA: Diagnosis not present

## 2023-05-18 DIAGNOSIS — Z0000 Encounter for general adult medical examination without abnormal findings: Secondary | ICD-10-CM

## 2023-05-18 DIAGNOSIS — Z23 Encounter for immunization: Secondary | ICD-10-CM | POA: Diagnosis not present

## 2023-05-18 DIAGNOSIS — E034 Atrophy of thyroid (acquired): Secondary | ICD-10-CM | POA: Diagnosis not present

## 2023-05-18 DIAGNOSIS — Z7985 Long-term (current) use of injectable non-insulin antidiabetic drugs: Secondary | ICD-10-CM | POA: Diagnosis not present

## 2023-05-18 DIAGNOSIS — E1169 Type 2 diabetes mellitus with other specified complication: Secondary | ICD-10-CM | POA: Diagnosis not present

## 2023-05-18 DIAGNOSIS — E538 Deficiency of other specified B group vitamins: Secondary | ICD-10-CM | POA: Diagnosis not present

## 2023-05-18 DIAGNOSIS — Z1159 Encounter for screening for other viral diseases: Secondary | ICD-10-CM | POA: Diagnosis not present

## 2023-05-18 DIAGNOSIS — E119 Type 2 diabetes mellitus without complications: Secondary | ICD-10-CM

## 2023-05-18 DIAGNOSIS — I480 Paroxysmal atrial fibrillation: Secondary | ICD-10-CM | POA: Diagnosis not present

## 2023-05-18 MED ORDER — FUROSEMIDE 20 MG PO TABS: 20.0000 mg | ORAL_TABLET | Freq: Every day | ORAL | 3 refills | Status: DC | PRN

## 2023-05-18 NOTE — Progress Notes (Signed)
Emily [name] is a 72 y.o. female presents to office today for annual physical exam examination.    Concerns today include: 1. Type 2 Diabetes with hypertension, hyperlipidemia with atrial fibrillation:  Compliant with meds. Restarted Glipizide because she was waking up with BGs in 200s. No hypoglycemia.  Had a run of atrial fibrillation since I saw her last.  Was seen in the ER for this and cardioverted chemically.  Last eye exam: Up-to-date Last foot exam: Up-to-date Last A1c:  Lab Results  Component Value Date   HGBA1C 5.8 (H) 03/16/2023   Nephropathy screen indicated?:  Needs Last flu, zoster and/or pneumovax:  Immunization History  Administered Date(s) Administered   Fluad Quad(high Dose 65+) 03/24/2016, 02/28/2019, 03/05/2020, 06/09/2022   Influenza, High Dose Seasonal PF 03/24/2016, 03/22/2018   Influenza, Seasonal, Injecte, Preservative Fre 06/05/2014, 07/02/2015   Influenza,inj,Quad PF,6+ Mos 06/05/2014, 07/02/2015   Influenza-Unspecified 03/24/2016   PFIZER(Purple Top)SARS-COV-2 Vaccination 07/10/2019, 08/03/2019, 03/13/2020   Pneumococcal Conjugate-13 07/09/2015   Pneumococcal Polysaccharide-23 01/30/2014, 02/28/2019   Tdap 05/26/2014    ROS: No chest pain, shortness of breath.  Has chronic lower extremity edema.  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  Not using compression hose because they are difficult and time consuming to get on  Occupation: hairdresser, Marital status: single, Substance use: none Health Maintenance Due  Topic Date Due   Diabetic kidney evaluation - Urine ACR  06/10/2023   Refills needed today: all  Immunization History  Administered Date(s) Administered   Fluad Quad(high Dose 65+) 03/24/2016, 02/28/2019, 03/05/2020, 06/09/2022   Influenza, High Dose Seasonal PF 03/24/2016, 03/22/2018   Influenza, Seasonal, Injecte, Preservative Fre 06/05/2014, 07/02/2015   Influenza,inj,Quad PF,6+ Mos 06/05/2014, 07/02/2015    Influenza-Unspecified 03/24/2016   PFIZER(Purple Top)SARS-COV-2 Vaccination 07/10/2019, 08/03/2019, 03/13/2020   Pneumococcal Conjugate-13 07/09/2015   Pneumococcal Polysaccharide-23 01/30/2014, 02/28/2019   Tdap 05/26/2014   Past Medical History:  Diagnosis Date   Anemia    Arthritis    Atrial fibrillation, rapid (HCC) 10/27/2013   Bilateral carotid artery disease (HCC)    Complication of anesthesia    difficult intubation in past   Diabetes mellitus without complication (HCC)    type 2   Difficult intubation    states 'lady that did the sleep study told me I have the smallest airway she has ever seen in an adult"   Dyslipidemia 06/29/2017   Heart murmur    History of blood transfusion 09/2019   GI bleed - in [place]   Hypertension    Hypothyroid    Obesity    Obstructive sleep apnea    occasional uses CPAP   Peripheral vascular disease (HCC)    Persistent atrial fibrillation (HCC) 01/16/2020   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" - xrays were negative   Upper GI bleed 09/25/2019   Social History   Socioeconomic History   Marital status: Single    Spouse name: Not on file   Number of children: Not on file   Years of education: Not on file   Highest education level: Not on file  Occupational History   Not on file  Tobacco Use   Smoking status: Never    Passive exposure: Never   Smokeless tobacco: Never  Vaping Use   Vaping status: Never Used  Substance and Sexual Activity   Alcohol use: No    Alcohol/week: 0.0 standard drinks of alcohol   Drug use: Never   Sexual activity: Not Currently  Birth control/protection: Post-menopausal  Other Topics Concern   Not on file  Social History Narrative   Not on file   Social Drivers of Health   Financial Resource Strain: Low Risk  (04/21/2022)   Overall Financial Resource Strain (CARDIA)    Difficulty of Paying Living Expenses: Not hard at all  Food Insecurity: No Food Insecurity (03/22/2023)    Hunger Vital Sign    Worried About Running Out of Food in the Last Year: Never true    Ran Out of Food in the Last Year: Never true  Transportation Needs: No Transportation Needs (03/22/2023)   PRAPARE - Administrator, Civil Service (Medical): No    Lack of Transportation (Non-Medical): No  Physical Activity: Inactive (04/21/2022)   Exercise Vital Sign    Days of Exercise per Week: 0 days    Minutes of Exercise per Session: 0 min  Stress: No Stress Concern Present (04/21/2022)   [employer] of Occupational Health - Occupational Stress Questionnaire    Feeling of Stress : Not at all  Social Connections: Moderately Integrated (04/21/2022)   Social Connection and Isolation Panel [NHANES]    Frequency of Communication with Friends and Family: More than three times a week    Frequency of Social Gatherings with Friends and Family: Twice a week    Attends Religious Services: More than 4 times per year    Active Member of [employer] or Organizations: Yes    Attends Banker Meetings: More than 4 times per year    Marital Status: Never married  Intimate Partner Violence: Not At Risk (03/19/2023)   Humiliation, Afraid, Rape, and Kick questionnaire    Fear of Current or Ex-Partner: No    Emotionally Abused: No    Physically Abused: No    Sexually Abused: No   Past Surgical History:  Procedure Laterality Date   CATARACT EXTRACTION Left    CATARACT EXTRACTION W/PHACO Right 12/09/2015   Procedure: CATARACT EXTRACTION PHACO AND INTRAOCULAR LENS PLACEMENT (IOC);  Surgeon: Gemma Payor, MD;  Location: [facility];  Service: Ophthalmology;  Laterality: Right;  CDE:10.48   COLONOSCOPY W/ POLYPECTOMY     cyst on back of neck removed     DILATION AND CURETTAGE OF UTERUS     x 5   ENDARTERECTOMY Left 08/30/2015   Procedure: LEFT CAROYID ENDARTERECTOMY WITH XENOSURE BOVINE PERICARDIUM PATCH ANGIOPLASTY;  Surgeon: Nada Libman, MD;  Location: [facility];  Service: Vascular;   Laterality: Left;   TONSILLECTOMY     TRANSCAROTID ARTERY REVASCULARIZATION  Right 03/19/2023   Procedure: Right Transcarotid Artery Revascularization;  Surgeon: Nada Libman, MD;  Location: [facility];  Service: Vascular;  Laterality: Right;   ULTRASOUND GUIDANCE FOR VASCULAR ACCESS Left 03/19/2023   Procedure: ULTRASOUND GUIDANCE FOR VASCULAR ACCESS;  Surgeon: Nada Libman, MD;  Location: [facility];  Service: Vascular;  Laterality: Left;   UPPER GI ENDOSCOPY     x several   Family History  Problem Relation Age of Onset   COPD Father    Heart failure Father    Heart disease Father    Emphysema Father    Arrhythmia Sister    Arrhythmia Sister    Arrhythmia Sister        had PPM also   Cancer Sister     Current Outpatient Medications:    Accu-Chek Softclix Lancets lancets, Test BS daily Dx E11.69, Disp: 100 each, Rfl: 3   aspirin EC 81 MG tablet, Take 81 mg  by mouth daily., Disp: , Rfl:    bisacodyl (DULCOLAX) 5 MG EC tablet, Take 5 mg by mouth daily as needed (constipation)., Disp: , Rfl:    clopidogrel (PLAVIX) 75 MG tablet, Take 1 tablet (75 mg total) by mouth daily., Disp: 30 tablet, Rfl: 6   diltiazem (CARDIZEM CD) 180 MG 24 hr capsule, TAKE 1 CAPSULE BY MOUTH EVERY DAY, Disp: 90 capsule, Rfl: 2   diltiazem (CARDIZEM) 30 MG tablet, Take 30 mg by mouth 3 (three) times daily as needed (afib)., Disp: , Rfl:    diphenhydramine-acetaminophen (TYLENOL PM) 25-500 MG TABS tablet, Take 1 tablet by mouth at bedtime as needed (Pain)., Disp: , Rfl:    ezetimibe (ZETIA) 10 MG tablet, TAKE 1 TABLET BY MOUTH EVERY DAY, Disp: 90 tablet, Rfl: 3   flecainide (TAMBOCOR) 100 MG tablet, TAKE 1 TABLET BY MOUTH TWICE A DAY, Disp: 180 tablet, Rfl: 3   furosemide (LASIX) 20 MG tablet, TAKE 1 TABLET (20 MG TOTAL) BY MOUTH DAILY AS NEEDED FOR FLUID., Disp: 90 tablet, Rfl: 0   gabapentin (NEURONTIN) 300 MG capsule, Take 1 capsule (300 mg total) by mouth 2 (two) times daily as needed (neuropathy right leg).,  Disp: 180 capsule, Rfl: 3   glipiZIDE (GLUCOTROL) 5 MG tablet, Take 1 tablet (5 mg total) by mouth daily before breakfast. Dose reduction, Disp: 90 tablet, Rfl: 3   glucose blood (ACCU-CHEK GUIDE) test strip, Test BS daily Dx E11.69, Disp: 100 each, Rfl: 3   HYDROcodone-acetaminophen (NORCO) 5-325 MG tablet, Take 1 tablet by mouth every 6 (six) hours as needed for severe pain (pain score 7-10)., Disp: 10 tablet, Rfl: 0   levonorgestrel (MIRENA) 20 MCG/DAY IUD, 1 each by Intrauterine route once., Disp: , Rfl:    levothyroxine (SYNTHROID) 112 MCG tablet, TAKE 1 TABLET BY MOUTH EVERY OTHER DAY, ALTERNATING WITH 1 & 1/2 TABLETS EVERY OTHER DAY, Disp: 112 tablet, Rfl: 3   linaclotide (LINZESS) 72 MCG capsule, Take 72 mcg by mouth daily before breakfast., Disp: , Rfl:    losartan (COZAAR) 25 MG tablet, TAKE 1 TABLET (25 MG TOTAL) BY MOUTH DAILY., Disp: 90 tablet, Rfl: 3   lovastatin (MEVACOR) 40 MG tablet, TAKE 1 TABLET BY MOUTH EVERY DAY IN THE EVENING, Disp: 90 tablet, Rfl: 3   metFORMIN (GLUCOPHAGE) 1000 MG tablet, Take 1 tablet (1,000 mg total) by mouth 2 (two) times daily with a meal. (Patient taking differently: Take 500 mg by mouth 2 (two) times daily with a meal.), Disp: 180 tablet, Rfl: 3   metoprolol succinate (TOPROL-XL) 50 MG 24 hr tablet, Take 1 tablet (50 mg total) by mouth at bedtime. Hold if systolic BP  less than 100 or HR less than 50 bpm, Disp: 30 tablet, Rfl: 0   polyethylene glycol (MIRALAX / GLYCOLAX) 17 g packet, Take 17 g by mouth daily as needed for moderate constipation., Disp: , Rfl:    psyllium (METAMUCIL) 58.6 % packet, Take 1 packet by mouth daily as needed (Constipation)., Disp: , Rfl:    rOPINIRole (REQUIP) 0.5 MG tablet, Take 1 tablet (0.5 mg total) by mouth at bedtime., Disp: 90 tablet, Rfl: 3   Semaglutide,0.25 or 0.5MG /DOS, 2 MG/3ML SOPN, Inject 0.5 mg into the skin once a week., Disp: 3 mL, Rfl:   Current Facility-Administered Medications:    cyanocobalamin ((VITAMIN  B-12)) injection 1,000 mcg, 1,000 mcg, Intramuscular, Q30 days, Elenora Gamma, MD, 1,000 mcg at 04/20/23 1005  Allergies  Allergen Reactions   Jardiance [Empagliflozin] Other (See  Comments)    RECURRENT VAGINITIS   Quinapril Hcl Cough   Statins Other (See Comments)    Not all Statins but some cause cough and pain in legs.   Tape Other (See Comments)    Redness, please use "paper" tape     ROS: Review of Systems Pertinent items noted in HPI and remainder of comprehensive ROS otherwise negative.    Physical exam BP 139/62   Pulse 61   Temp 98.5 F (36.9 C)   Ht 5\' 2"  (1.575 m)   Wt 217 lb (98.4 kg)   SpO2 99%   BMI 39.69 kg/m  General appearance: alert, cooperative, appears stated age, no distress, and morbidly obese Head: Normocephalic, without obvious abnormality, atraumatic Eyes: negative findings: lids and lashes normal, conjunctivae and sclerae normal, corneas clear, and pupils equal, round, reactive to light and accomodation Ears: normal TM's and external ear canals both ears Nose: Nares normal. Septum midline. Mucosa normal. No drainage or sinus tenderness. Throat: lips, mucosa, and tongue normal; teeth and gums normal and Mallampati 3 Neck: no adenopathy, supple, symmetrical, trachea midline, and thyroid not enlarged, symmetric, no tenderness/mass/nodules Back: symmetric, no curvature. ROM normal. No CVA tenderness. Lungs: clear to auscultation bilaterally Heart:  Regular rate and rhythm with murmur appreciated Abdomen: soft, non-tender; bowel sounds normal; no masses,  no organomegaly Extremities:  Warm.  She has trace edema to mid shins bilaterally Pulses: 2+ and symmetric Skin:  abrasion on left forearm Lymph nodes: Cervical, supraclavicular, and axillary nodes normal. Neurologic: Grossly normal      05/18/2023    9:01 AM 03/02/2023   10:02 AM 10/13/2022   11:01 AM  Depression screen PHQ 2/9  Decreased Interest 0 0 0  Down, Depressed, Hopeless 0 0 0   PHQ - 2 Score 0 0 0  Altered sleeping 0 0 1  Tired, decreased energy 0 0 1  Change in appetite 0 0 0  Feeling bad or failure about yourself  0 0 0  Trouble concentrating 0 0 0  Moving slowly or fidgety/restless 0 0 0  Suicidal thoughts 0 0 0  PHQ-9 Score 0 0 2  Difficult doing work/chores Not difficult at all Not difficult at all Somewhat difficult      05/18/2023    9:02 AM 03/02/2023   10:02 AM 10/13/2022   11:00 AM 06/09/2022    9:50 AM  GAD 7 : Generalized Anxiety Score  Nervous, Anxious, on Edge 0 0 0 0  Control/stop worrying 0 0 0 0  Worry too much - different things 0 0 0 0  Trouble relaxing 0 0 0 0  Restless 0 0 0 0  Easily annoyed or irritable 0 0 0 0  Afraid - awful might happen 0 0 0 0  Total GAD 7 Score 0 0 0 0  Anxiety Difficulty   Not difficult at all Not difficult at all     Assessment/ Plan: Emily [name] here for annual physical exam.   Annual physical exam  Diabetes mellitus treated with injections of non-insulin medication (HCC) - Plan: Microalbumin / creatinine urine ratio  Hypertension associated with diabetes (HCC) - Plan: CMP14+EGFR  Hyperlipidemia associated with type 2 diabetes mellitus (HCC) - Plan: CMP14+EGFR, Lipid Panel  Hypothyroidism due to acquired atrophy of thyroid - Plan: TSH, T4, Free  PAF (paroxysmal atrial fibrillation) (HCC)  Vitamin B12 deficiency - Plan: Vitamin B12  Need for hepatitis C screening test - Plan: Hepatitis C Antibody  Encounter for immunization - Plan: Flu  Vaccine Trivalent High Dose (Fluad)  Influenza vaccination administered.  She is going to hold off on shingles vaccination until her next visit.  Sugar at goal.  No changes needed at this time.  Check urine microalbumin.  Check renal function, liver enzymes and electrolytes  Fasting lipid panel collected.  Continue current regimen for cholesterol control  Check thyroid levels  Recent run of A-fib but has cardioverted with medication.  She was regular  rate and rhythm on exam with murmur appreciated that is her baseline  Check B12 given history of deficiency, screening hepatitis C  Counseled on healthy lifestyle choices, including diet (rich in fruits, vegetables and lean meats and low in salt and simple carbohydrates) and exercise (at least 30 minutes of moderate physical activity daily).  Patient to follow up 4 to 6 months for diabetes, sooner if concerns arise  Emily [name] M. Nadine Counts, DO

## 2023-05-19 LAB — CMP14+EGFR
ALT: 14 [IU]/L (ref 0–32)
AST: 19 [IU]/L (ref 0–40)
Albumin: 4.3 g/dL (ref 3.8–4.8)
Alkaline Phosphatase: 112 [IU]/L (ref 44–121)
BUN/Creatinine Ratio: 23 (ref 12–28)
BUN: 16 mg/dL (ref 8–27)
Bilirubin Total: 0.5 mg/dL (ref 0.0–1.2)
CO2: 21 mmol/L (ref 20–29)
Calcium: 9.3 mg/dL (ref 8.7–10.3)
Chloride: 101 mmol/L (ref 96–106)
Creatinine, Ser: 0.71 mg/dL (ref 0.57–1.00)
Globulin, Total: 2.5 g/dL (ref 1.5–4.5)
Glucose: 131 mg/dL — ABNORMAL HIGH (ref 70–99)
Potassium: 4.4 mmol/L (ref 3.5–5.2)
Sodium: 139 mmol/L (ref 134–144)
Total Protein: 6.8 g/dL (ref 6.0–8.5)
eGFR: 90 mL/min/{1.73_m2} (ref >59)

## 2023-05-19 LAB — LIPID PANEL
Chol/HDL Ratio: 3.1 {ratio} (ref 0.0–4.4)
Cholesterol, Total: 165 mg/dL (ref 100–199)
HDL: 53 mg/dL (ref >39)
LDL Chol Calc (NIH): 90 mg/dL (ref 0–99)
Triglycerides: 125 mg/dL (ref 0–149)
VLDL Cholesterol Cal: 22 mg/dL (ref 5–40)

## 2023-05-19 LAB — VITAMIN B12: Vitamin B-12: 514 pg/mL (ref 232–1245)

## 2023-05-19 LAB — HEPATITIS C ANTIBODY: Hep C Virus Ab: NONREACTIVE

## 2023-05-19 LAB — MICROALBUMIN / CREATININE URINE RATIO
Creatinine, Urine: 87.6 mg/dL
Microalb/Creat Ratio: 58 mg/g{creat} — ABNORMAL HIGH (ref 0–29)
Microalbumin, Urine: 51.1 ug/mL

## 2023-05-19 LAB — TSH: TSH: 1.88 u[IU]/mL (ref 0.450–4.500)

## 2023-05-19 LAB — T4, FREE: Free T4: 1.66 ng/dL (ref 0.82–1.77)

## 2023-05-21 ENCOUNTER — Ambulatory Visit (INDEPENDENT_AMBULATORY_CARE_PROVIDER_SITE_OTHER): Payer: Medicare Other | Admitting: Family Medicine

## 2023-05-21 DIAGNOSIS — E538 Deficiency of other specified B group vitamins: Secondary | ICD-10-CM | POA: Diagnosis not present

## 2023-06-02 NOTE — Progress Notes (Signed)
 Cardiology Office Note    Date:  06/03/2023  ID:  Emily [name], DOB 04/28/1951, MRN [id number] PCP:  Jolinda Norene HERO, DO  Cardiologist:  Lynwood Schilling, MD  Electrophysiologist:  Soyla Gladis Norton, MD   Chief Complaint: Atrial fibrillation   History of Present Illness: .    Emily [name] is a 73 y.o. female with visit-pertinent history of paroxysmal atrial fibrillation, long-term antiarrhythmic medication use, history of GI bleed s/p Watchman procedure, AVMs, mild mitral stenosis, moderate aortic stenosis, carotid artery disease s/p stenting.  Patient with history of paroxysmal atrial fibrillation, treated with flecainide .  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  She underwent Watchman procedure.  Echocardiogram on 02/22/2023 indicated LVEF of 70 to 75%, no RWMA, mild LVH, grade 2 diastolic dysfunction, mild mitral valve regurgitation and mild mitral stenosis, moderate aortic valve stenosis, gradient 24.2 mmHg.  Patient underwent carotid stenting on 03/19/2023.  [name] was last seen in office by Dr. Schilling on 05/05/2023.  Patient was noted to have episodes of bradycardia, 1 prior to her carotid stenting, she was symptomatic with this.  It was noted to be very transient.  Cardiac monitor worn for 3 days in 03/2023 indicated an average heart rate of 63 bpm, minimum heart rate of 45 bpm and max heart rate of 88 bpm.  There were no significant arrhythmias noted on the monitor.  When seen by Dr. Schilling she was stable from a cardiac standpoint, she denied any presyncope or syncope.  On 05/06/2023 she presented to the emergency room with symptomatic atrial fibrillation with RVR.  Patient had forgotten to take her regular dose of diltiazem  180 mg the prior 2 mornings.  She was started on diltiazem  drip for rate control strategy and spontaneously converted back to normal  sinus rhythm. She was continued on diltiazem  180 mg p.o. every morning and flecainide  100 mg twice daily.  Started on Toprol  XL 50 mg every afternoon.   Today she presents for follow-up.  She reports that she is doing well.  She denies any further episodes of atrial fibrillation, she feels that she had her one episode as she became mixed up on which medications she had already taken, feels that she missed a few doses of Cardizem .  She reports that she is overall feeling better since restarting of her medications.  Labwork independently reviewed: 05/18/2023: Sodium 139, potassium 4.4, creatinine 0.71, AST 19, ALT 14, TSH 1.880 ROS: .   Today she denies chest pain, shortness of breath, lower extremity edema, fatigue, palpitations, melena, hematuria, hemoptysis, diaphoresis, weakness, presyncope, syncope, orthopnea, and PND.  All other systems are reviewed and otherwise negative. Studies Reviewed: SABRA    EKG:  EKG is ordered today, personally reviewed, demonstrating  EKG Interpretation Date/Time:  Thursday June 03 2023 11:29:22 EST Ventricular Rate:  60 PR Interval:  208 QRS Duration:  170 QT Interval:  502 QTC Calculation: 502 R Axis:   -68  Text Interpretation: Normal sinus rhythm Right bundle branch block Left anterior fascicular block Bifascicular block Minimal voltage criteria for LVH, may be normal variant ( R in aVL ) Confirmed by Calixto Pavel [phone]) on 06/03/2023 11:38:15 AM   CV Studies:  Cardiac Studies & Procedures     STRESS TESTS  NM MYOCAR MULTI W/SPECT W 09/17/2020  Narrative  There was no ST segment deviation noted during stress.  Findings consistent with prior distal anteior/apical  myocardial infarction with mild peri-infarct ischemia.  The left ventricular ejection fraction is normal (55-65%).  Low risk based on perfusion imaging. Elevated TID 1.41 may suggest higher relative risk.  ECHOCARDIOGRAM  ECHOCARDIOGRAM COMPLETE 02/22/2023  Narrative ECHOCARDIOGRAM  REPORT    Patient Name:   Emily [name] Date of Exam: 02/22/2023 Medical Rec #:  [id number]        Height:       62.0 in Accession #:    [id number]       Weight:       219.8 lb Date of Birth:  05/20/1951        BSA:          1.990 m Patient Age:    72 years         BP:           154/74 mmHg Patient Gender: F                HR:           64 bpm. Exam Location:  Outpatient  Procedure: 2D Echo, Cardiac Doppler and Color Doppler  Indications:    I35.0 Nonrheumatic aortic (valve) stenosis  History:        Patient has prior history of Echocardiogram examinations, most recent 09/08/2022. Carotid Disease and PAD, Aortic Valve Disease and Mitral Valve Disease, Arrythmias:Atrial Fibrillation; Risk Factors:Hypertension, Diabetes and Non-Smoker.  Sonographer:    Tillman Nora RVT RCS Referring Phys: LYNWOOD SCHILLING  IMPRESSIONS   1. Left ventricular ejection fraction, by estimation, is 70 to 75%. The left ventricle has hyperdynamic function. The left ventricle has no regional wall motion abnormalities. There is mild left ventricular hypertrophy. Left ventricular diastolic parameters are consistent with Grade II diastolic dysfunction (pseudonormalization). 2. Right ventricular systolic function is normal. The right ventricular size is normal. 3. Left atrial size was moderately dilated. 4. The mitral valve is normal in structure. Mild mitral valve regurgitation. Mild mitral stenosis. The mean mitral valve gradient is 3.0 mmHg. Severe mitral annular calcification. 5. The aortic valve is normal in structure. Aortic valve regurgitation is not visualized. Moderate aortic valve stenosis. Aortic valve area, by VTI measures 1.22 cm. Aortic valve mean gradient measures 24.2 mmHg. Aortic valve Vmax measures 3.26 m/s. 6. The inferior vena cava is dilated in size with >50% respiratory variability, suggesting right atrial pressure of 8 mmHg.  Comparison(s): Prior aortic valve mean gradient 39  mmHg.  FINDINGS Left Ventricle: Left ventricular ejection fraction, by estimation, is 70 to 75%. The left ventricle has hyperdynamic function. The left ventricle has no regional wall motion abnormalities. The left ventricular internal cavity size was normal in size. There is mild left ventricular hypertrophy. Left ventricular diastolic parameters are consistent with Grade II diastolic dysfunction (pseudonormalization).  Right Ventricle: The right ventricular size is normal. No increase in right ventricular wall thickness. Right ventricular systolic function is normal.  Left Atrium: Left atrial size was moderately dilated.  Right Atrium: Right atrial size was normal in size.  Pericardium: There is no evidence of pericardial effusion.  Mitral Valve: The mitral valve is normal in structure. Severe mitral annular calcification. Mild mitral valve regurgitation. Mild mitral valve stenosis. MV peak gradient, 10.6 mmHg. The mean mitral valve gradient is 3.0 mmHg.  Tricuspid Valve: The tricuspid valve is normal in structure. Tricuspid valve regurgitation is not demonstrated. No evidence of tricuspid stenosis.  Aortic Valve: The aortic valve is normal in structure. Aortic valve regurgitation is not visualized. Moderate aortic stenosis is present.  Aortic valve mean gradient measures 24.2 mmHg. Aortic valve peak gradient measures 42.5 mmHg. Aortic valve area, by VTI measures 1.22 cm.  Pulmonic Valve: The pulmonic valve was normal in structure. Pulmonic valve regurgitation is mild. No evidence of pulmonic stenosis.  Aorta: The aortic root is normal in size and structure.  Venous: The inferior vena cava is dilated in size with greater than 50% respiratory variability, suggesting right atrial pressure of 8 mmHg.  IAS/Shunts: No atrial level shunt detected by color flow Doppler.   LEFT VENTRICLE PLAX 2D LVIDd:         5.00 cm   Diastology LVIDs:         3.40 cm   LV e' medial:    4.73 cm/s LV PW:          1.30 cm   LV E/e' medial:  37.6 LV IVS:        1.30 cm   LV e' lateral:   4.67 cm/s LVOT diam:     2.00 cm   LV E/e' lateral: 38.1 LV SV:         93 LV SV Index:   47 LVOT Area:     3.14 cm   RIGHT VENTRICLE             IVC RV S prime:     10.90 cm/s  IVC diam: 2.10 cm TAPSE (M-mode): 2.4 cm  LEFT ATRIUM             Index        RIGHT ATRIUM           Index LA diam:        4.50 cm 2.26 cm/m   RA Area:     14.30 cm LA Vol (A2C):   86.8 ml 43.62 ml/m  RA Volume:   39.10 ml  19.65 ml/m LA Vol (A4C):   74.1 ml 37.21 ml/m LA Biplane Vol: 89.0 ml 44.72 ml/m AORTIC VALVE                     PULMONIC VALVE AV Area (Vmax):    1.11 cm      PV Vmax:       1.02 m/s AV Area (Vmean):   1.07 cm      PV Peak grad:  4.2 mmHg AV Area (VTI):     1.22 cm AV Vmax:           326.00 cm/s AV Vmean:          231.000 cm/s AV VTI:            0.762 m AV Peak Grad:      42.5 mmHg AV Mean Grad:      24.2 mmHg LVOT Vmax:         114.85 cm/s LVOT Vmean:        78.400 cm/s LVOT VTI:          0.297 m LVOT/AV VTI ratio: 0.39  AORTA Ao Root diam: 2.70 cm Ao Asc diam:  3.20 cm  MITRAL VALVE MV Area (PHT): 3.60 cm     SHUNTS MV Area VTI:   1.79 cm     Systemic VTI:  0.30 m MV Peak grad:  10.6 mmHg    Systemic Diam: 2.00 cm MV Mean grad:  3.0 mmHg MV Vmax:       1.63 m/s MV Vmean:      80.8 cm/s MV Decel Time: 211 msec MV E velocity:  178.00 cm/s MV A velocity: 92.40 cm/s MV E/A ratio:  1.93  Oneil Parchment MD Electronically signed by Oneil Parchment MD Signature Date/Time: 02/22/2023/1:24:41 PM    Final   MONITORS  LONG TERM MONITOR (3-14 DAYS) 03/09/2023  Narrative The predominant rhythm was normal sinus. Rare ectopy No sustained arrhythmias.            Current Reported Medications:.    Current Meds  Medication Sig   Accu-Chek Softclix Lancets lancets Test BS daily Dx E11.69   bisacodyl  (DULCOLAX) 5 MG EC tablet Take 5 mg by mouth daily as needed (constipation).    clopidogrel  (PLAVIX ) 75 MG tablet Take 1 tablet (75 mg total) by mouth daily.   diltiazem  (CARDIZEM  CD) 180 MG 24 hr capsule TAKE 1 CAPSULE BY MOUTH EVERY DAY   diltiazem  (CARDIZEM ) 30 MG tablet Take 30 mg by mouth 3 (three) times daily as needed (afib).   diphenhydramine -acetaminophen  (TYLENOL  PM) 25-500 MG TABS tablet Take 1 tablet by mouth at bedtime as needed (Pain).   ezetimibe  (ZETIA ) 10 MG tablet TAKE 1 TABLET BY MOUTH EVERY DAY   flecainide  (TAMBOCOR ) 100 MG tablet TAKE 1 TABLET BY MOUTH TWICE A DAY   furosemide  (LASIX ) 20 MG tablet Take 1 tablet (20 mg total) by mouth daily as needed for fluid.   gabapentin  (NEURONTIN ) 300 MG capsule Take 1 capsule (300 mg total) by mouth 2 (two) times daily as needed (neuropathy right leg).   glipiZIDE  (GLUCOTROL ) 5 MG tablet Take 1 tablet (5 mg total) by mouth daily before breakfast. Dose reduction   glucose blood (ACCU-CHEK GUIDE) test strip Test BS daily Dx E11.69   HYDROcodone -acetaminophen  (NORCO) 5-325 MG tablet Take 1 tablet by mouth every 6 (six) hours as needed for severe pain (pain score 7-10).   levonorgestrel (MIRENA) 20 MCG/DAY IUD 1 each by Intrauterine route once.   levothyroxine  (SYNTHROID ) 112 MCG tablet TAKE 1 TABLET BY MOUTH EVERY OTHER DAY, ALTERNATING WITH 1 & 1/2 TABLETS EVERY OTHER DAY   losartan  (COZAAR ) 25 MG tablet TAKE 1 TABLET (25 MG TOTAL) BY MOUTH DAILY.   lovastatin  (MEVACOR ) 40 MG tablet TAKE 1 TABLET BY MOUTH EVERY DAY IN THE EVENING   metFORMIN  (GLUCOPHAGE ) 1000 MG tablet Take 1 tablet (1,000 mg total) by mouth 2 (two) times daily with a meal. (Patient taking differently: Take 500 mg by mouth 2 (two) times daily with a meal.)   polyethylene glycol (MIRALAX  / GLYCOLAX ) 17 g packet Take 17 g by mouth daily as needed for moderate constipation.   psyllium (METAMUCIL) 58.6 % packet Take 1 packet by mouth daily as needed (Constipation).   rOPINIRole  (REQUIP ) 0.5 MG tablet Take 1 tablet (0.5 mg total) by mouth at bedtime.    Semaglutide ,0.25 or 0.5MG /DOS, 2 MG/3ML SOPN Inject 0.5 mg into the skin once a week.   [DISCONTINUED] metoprolol  succinate (TOPROL -XL) 50 MG 24 hr tablet Take 1 tablet (50 mg total) by mouth at bedtime. Hold if systolic BP  less than 100 or HR less than 50 bpm   Current Facility-Administered Medications for the 06/03/23 encounter (Office Visit) with Tymika Grilli D, NP  Medication   cyanocobalamin  ((VITAMIN B-12)) injection 1,000 mcg   Physical Exam:    VS:  BP 130/68 (BP Location: Left Arm, Patient Position: Sitting, Cuff Size: Normal)   Pulse 60   Ht 5' 2 (1.575 m)   Wt 219 lb (99.3 kg)   SpO2 95%   BMI 40.06 kg/m    Wt Readings from Last  3 Encounters:  06/03/23 219 lb (99.3 kg)  05/18/23 217 lb (98.4 kg)  05/06/23 216 lb (98 kg)    GEN: Well nourished, well developed in no acute distress NECK: No JVD; No carotid bruits CARDIAC: RRR, no murmurs, rubs, gallops RESPIRATORY:  Clear to auscultation without rales, wheezing or rhonchi  ABDOMEN: Soft, non-tender, non-distended EXTREMITIES:  No edema; No acute deformity   Asessement and Plan:.    PAF: Patient with history of PAF, on flecainide .  S/p Watchman in 2020.  Presented to the ED on 05/06/2023 with symptomatic atrial fibrillation. Spontaneously converted on diltiazem  drip. She was continued on diltiazem  180 mg p.o. every morning and flecainide  100 mg twice daily.  Started on Toprol  XL 50 mg every afternoon. Today she denies any further episodes of atrial fibrillation or palpitations.  Will continue flecainide , diltiazem  and Toprol . Refill of Toprol  provided.  CHA2DS2-VASc Score = 4 [CHF History: 0, HTN History: 1, Diabetes History: 1, Stroke History: 0, Vascular Disease History: 0, Age Score: 1, Gender Score: 1].  Therefore, the patient's annual risk of stroke is 4.8 %.      Aortic stenosis: Aortic stenosis some noted as moderate with gradient 24.2 mmHg on echocardiogram on 02/22/2023. Dr. Lavona plans to repeat echo in 1  year.  Carotid stenosis: S/p carotid stenting in 03/2023.  On aspirin  and Plavix , followed by [facility].  Mild mitral stenosis: Noted on echo, will monitor clinically and with repeat echo for monitoring of aortic stenosis.  Hyperlipidemia: Last lipid profile in 05/18/2023 indicated total cholesterol 165, triglycerides 125, HDL 53 and LDL 90.  On lovastatin  40 mg daily and Zetia  10 mg daily.    Disposition: F/u with Dr. Lavona in five months or sooner if needed.   Signed, Betzabeth Derringer D Brenner Visconti, NP

## 2023-06-03 ENCOUNTER — Ambulatory Visit: Payer: Medicare Other | Attending: Cardiology | Admitting: Cardiology

## 2023-06-03 ENCOUNTER — Encounter: Payer: Self-pay | Admitting: Cardiology

## 2023-06-03 VITALS — BP 130/68 | HR 60 | Ht 62.0 in | Wt 219.0 lb

## 2023-06-03 DIAGNOSIS — I480 Paroxysmal atrial fibrillation: Secondary | ICD-10-CM

## 2023-06-03 DIAGNOSIS — I350 Nonrheumatic aortic (valve) stenosis: Secondary | ICD-10-CM

## 2023-06-03 DIAGNOSIS — I6523 Occlusion and stenosis of bilateral carotid arteries: Secondary | ICD-10-CM | POA: Diagnosis not present

## 2023-06-03 DIAGNOSIS — I050 Rheumatic mitral stenosis: Secondary | ICD-10-CM

## 2023-06-03 MED ORDER — METOPROLOL SUCCINATE ER 50 MG PO TB24: 50.0000 mg | ORAL_TABLET | Freq: Every evening | ORAL | 3 refills | Status: DC

## 2023-06-03 NOTE — Patient Instructions (Signed)
 Medication Instructions:  No changes *If you need a refill on your cardiac medications before your next appointment, please call your pharmacy*  Lab Work: No labs If you have labs (blood work) drawn today and your tests are completely normal, you will receive your results only by: MyChart Message (if you have MyChart) OR A paper copy in the mail If you have any lab test that is abnormal or we need to change your treatment, we will call you to review the results.  Testing/Procedures: No testing  Follow-Up: At [facility], you and your health needs are our priority.  As part of our continuing mission to provide you with exceptional heart care, we have created designated Provider Care Teams.  These Care Teams include your primary Cardiologist (physician) and Advanced Practice Providers (APPs -  Physician Assistants and Nurse Practitioners) who all work together to provide you with the care you need, when you need it.  We recommend signing up for the patient portal called "MyChart".  Sign up information is provided on this After Visit Summary.  MyChart is used to connect with patients for Virtual Visits (Telemedicine).  Patients are able to view lab/test results, encounter notes, upcoming appointments, etc.  Non-urgent messages can be sent to your provider as well.   To learn more about what you can do with MyChart, go to [email or web address].    Your next appointment:   6 month(s)  Provider:   Rollene Rotunda, MD

## 2023-06-12 ENCOUNTER — Telehealth: Payer: Self-pay | Admitting: Internal Medicine

## 2023-06-12 NOTE — Telephone Encounter (Signed)
 Patient call for accidentally doubling up on medications (losartan, plavix, diltiazem, zetia, diabetes medications) and seeks advice. Recommending holding AM diltiazem, plavix and losartan; restart normal schedule in the evening. Provided ED precautions.

## 2023-06-14 ENCOUNTER — Ambulatory Visit: Payer: Medicare Other | Admitting: Physician Assistant

## 2023-06-14 ENCOUNTER — Ambulatory Visit (HOSPITAL_COMMUNITY)
Admission: RE | Admit: 2023-06-14 | Discharge: 2023-06-14 | Disposition: A | Discharge status: Home / Self Care | Payer: Medicare Other | Source: Ambulatory Visit | Attending: Surgery | Admitting: Surgery

## 2023-06-14 VITALS — BP 173/67 | HR 94 | Temp 97.9°F | Resp 18 | Ht 62.0 in | Wt 217.4 lb

## 2023-06-14 DIAGNOSIS — I6521 Occlusion and stenosis of right carotid artery: Secondary | ICD-10-CM

## 2023-06-14 DIAGNOSIS — Z9889 Other specified postprocedural states: Secondary | ICD-10-CM

## 2023-06-14 DIAGNOSIS — I6523 Occlusion and stenosis of bilateral carotid arteries: Secondary | ICD-10-CM | POA: Insufficient documentation

## 2023-06-14 NOTE — Progress Notes (Signed)
 Office Note     CC:  follow up Requesting Provider:  Jolinda Norene HERO, DO  HPI: Emily [name] is a 73 y.o. ([birth year]) female who presents for routine post operative follow up. She recently underwent Right TCAR on 03/19/23 by Dr. Serene for asymptomatic high grade stenosis. She has no history of TIA or stroke. She has remote history of left CEA in March of 2017 by Dr. Serene.  She initially was seen for her post op visit in November and she was doing well at that time. She did not have any new neurological symptoms.   Today she reports overall doing well. She denies any visual changes, slurred speech, facial drooping, unilateral upper or lower extremity weakness or numbness. She is medically managed on Plavix  and statin. She has statin intolerance but is able to take lovastatin . She says that someone told her to stop her Aspirin . She reports no issues with taking dual antiplatelets.   Past Medical History:  Diagnosis Date   Anemia    Arthritis    Atrial fibrillation, rapid (HCC) 10/27/2013   Bilateral carotid artery disease (HCC)    Complication of anesthesia    difficult intubation in past   Diabetes mellitus without complication (HCC)    type 2   Difficult intubation    states 'lady that did the sleep study told me I have the smallest airway she has ever seen in an adult   Dyslipidemia 06/29/2017   Heart murmur    History of blood transfusion 09/2019   GI bleed - in [place]   Hypertension    Hypothyroid    Obesity    Obstructive sleep apnea    occasional uses CPAP   Peripheral vascular disease (HCC)    Persistent atrial fibrillation (HCC) 01/16/2020   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 - xrays were negative   Upper GI bleed 09/25/2019    Past Surgical History:  Procedure Laterality Date   CATARACT EXTRACTION Left    CATARACT EXTRACTION W/PHACO Right 12/09/2015   Procedure: CATARACT EXTRACTION PHACO AND INTRAOCULAR LENS PLACEMENT (IOC);   Surgeon: Cherene Mania, MD;  Location: [facility];  Service: Ophthalmology;  Laterality: Right;  CDE:10.48   COLONOSCOPY W/ POLYPECTOMY     cyst on back of neck removed     DILATION AND CURETTAGE OF UTERUS     x 5   ENDARTERECTOMY Left 08/30/2015   Procedure: LEFT CAROYID ENDARTERECTOMY WITH XENOSURE BOVINE PERICARDIUM PATCH ANGIOPLASTY;  Surgeon: Gaile LELON Serene, MD;  Location: [facility];  Service: Vascular;  Laterality: Left;   TONSILLECTOMY     TRANSCAROTID ARTERY REVASCULARIZATION  Right 03/19/2023   Procedure: Right Transcarotid Artery Revascularization;  Surgeon: Serene Gaile LELON, MD;  Location: [facility];  Service: Vascular;  Laterality: Right;   ULTRASOUND GUIDANCE FOR VASCULAR ACCESS Left 03/19/2023   Procedure: ULTRASOUND GUIDANCE FOR VASCULAR ACCESS;  Surgeon: Serene Gaile LELON, MD;  Location: [facility];  Service: Vascular;  Laterality: Left;   UPPER GI ENDOSCOPY     x several    Social History   Socioeconomic History   Marital status: Single    Spouse name: Not on file   Number of children: Not on file   Years of education: Not on file   Highest education level: Not on file  Occupational History   Not on file  Tobacco Use   Smoking status: Never    Passive exposure: Never   Smokeless tobacco: Never  Vaping Use  Vaping status: Never Used  Substance and Sexual Activity   Alcohol use: No    Alcohol/week: 0.0 standard drinks of alcohol   Drug use: Never   Sexual activity: Not Currently    Birth control/protection: Post-menopausal  Other Topics Concern   Not on file  Social History Narrative   Not on file   Social Drivers of Health   Financial Resource Strain: Low Risk  (04/21/2022)   Overall Financial Resource Strain (CARDIA)    Difficulty of Paying Living Expenses: Not hard at all  Food Insecurity: No Food Insecurity (03/22/2023)   Hunger Vital Sign    Worried About Running Out of Food in the Last Year: Never true    Ran Out of Food in the Last Year: Never true   Transportation Needs: No Transportation Needs (03/22/2023)   PRAPARE - Administrator, Civil Service (Medical): No    Lack of Transportation (Non-Medical): No  Physical Activity: Inactive (04/21/2022)   Exercise Vital Sign    Days of Exercise per Week: 0 days    Minutes of Exercise per Session: 0 min  Stress: No Stress Concern Present (04/21/2022)   [employer] of Occupational Health - Occupational Stress Questionnaire    Feeling of Stress : Not at all  Social Connections: Moderately Integrated (04/21/2022)   Social Connection and Isolation Panel [NHANES]    Frequency of Communication with Friends and Family: More than three times a week    Frequency of Social Gatherings with Friends and Family: Twice a week    Attends Religious Services: More than 4 times per year    Active Member of [employer] or Organizations: Yes    Attends Engineer, Structural: More than 4 times per year    Marital Status: Never married  Intimate Partner Violence: Not At Risk (03/19/2023)   Humiliation, Afraid, Rape, and Kick questionnaire    Fear of Current or Ex-Partner: No    Emotionally Abused: No    Physically Abused: No    Sexually Abused: No    Family History  Problem Relation Age of Onset   COPD Father    Heart failure Father    Heart disease Father    Emphysema Father    Arrhythmia Sister    Arrhythmia Sister    Arrhythmia Sister        had PPM also   Cancer Sister     Current Outpatient Medications  Medication Sig Dispense Refill   Accu-Chek Softclix Lancets lancets Test BS daily Dx E11.69 100 each 3   aspirin  EC 81 MG tablet Take 81 mg by mouth daily. (Patient not taking: Reported on 06/03/2023)     bisacodyl  (DULCOLAX) 5 MG EC tablet Take 5 mg by mouth daily as needed (constipation).     clopidogrel  (PLAVIX ) 75 MG tablet Take 1 tablet (75 mg total) by mouth daily. 30 tablet 6   diltiazem  (CARDIZEM  CD) 180 MG 24 hr capsule TAKE 1 CAPSULE BY MOUTH EVERY DAY 90 capsule  2   diltiazem  (CARDIZEM ) 30 MG tablet Take 30 mg by mouth 3 (three) times daily as needed (afib).     diphenhydramine -acetaminophen  (TYLENOL  PM) 25-500 MG TABS tablet Take 1 tablet by mouth at bedtime as needed (Pain).     ezetimibe  (ZETIA ) 10 MG tablet TAKE 1 TABLET BY MOUTH EVERY DAY 90 tablet 3   flecainide  (TAMBOCOR ) 100 MG tablet TAKE 1 TABLET BY MOUTH TWICE A DAY 180 tablet 3   furosemide  (LASIX ) 20 MG tablet  Take 1 tablet (20 mg total) by mouth daily as needed for fluid. 90 tablet 3   gabapentin  (NEURONTIN ) 300 MG capsule Take 1 capsule (300 mg total) by mouth 2 (two) times daily as needed (neuropathy right leg). 180 capsule 3   glipiZIDE  (GLUCOTROL ) 5 MG tablet Take 1 tablet (5 mg total) by mouth daily before breakfast. Dose reduction 90 tablet 3   glucose blood (ACCU-CHEK GUIDE) test strip Test BS daily Dx E11.69 100 each 3   HYDROcodone -acetaminophen  (NORCO) 5-325 MG tablet Take 1 tablet by mouth every 6 (six) hours as needed for severe pain (pain score 7-10). 10 tablet 0   levonorgestrel (MIRENA) 20 MCG/DAY IUD 1 each by Intrauterine route once.     levothyroxine  (SYNTHROID ) 112 MCG tablet TAKE 1 TABLET BY MOUTH EVERY OTHER DAY, ALTERNATING WITH 1 & 1/2 TABLETS EVERY OTHER DAY 112 tablet 3   linaclotide  (LINZESS ) 72 MCG capsule Take 72 mcg by mouth daily before breakfast. (Patient not taking: Reported on 06/03/2023)     losartan  (COZAAR ) 25 MG tablet TAKE 1 TABLET (25 MG TOTAL) BY MOUTH DAILY. 90 tablet 3   lovastatin  (MEVACOR ) 40 MG tablet TAKE 1 TABLET BY MOUTH EVERY DAY IN THE EVENING 90 tablet 3   metFORMIN  (GLUCOPHAGE ) 1000 MG tablet Take 1 tablet (1,000 mg total) by mouth 2 (two) times daily with a meal. (Patient taking differently: Take 500 mg by mouth 2 (two) times daily with a meal.) 180 tablet 3   metoprolol  succinate (TOPROL -XL) 50 MG 24 hr tablet Take 1 tablet (50 mg total) by mouth at bedtime. Hold if systolic BP  less than 100 or HR less than 50 bpm 90 tablet 3   polyethylene  glycol (MIRALAX  / GLYCOLAX ) 17 g packet Take 17 g by mouth daily as needed for moderate constipation.     psyllium (METAMUCIL) 58.6 % packet Take 1 packet by mouth daily as needed (Constipation).     rOPINIRole  (REQUIP ) 0.5 MG tablet Take 1 tablet (0.5 mg total) by mouth at bedtime. 90 tablet 3   Semaglutide ,0.25 or 0.5MG /DOS, 2 MG/3ML SOPN Inject 0.5 mg into the skin once a week. 3 mL    Current Facility-Administered Medications  Medication Dose Route Frequency Provider Last Rate Last Admin   cyanocobalamin  ((VITAMIN B-12)) injection 1,000 mcg  1,000 mcg Intramuscular Q30 days Harl Jayson CROME, MD   1,000 mcg at 05/21/23 0901    Allergies  Allergen Reactions   Jardiance  [Empagliflozin ] Other (See Comments)    RECURRENT VAGINITIS   Quinapril Hcl Cough   Statins Other (See Comments)    Not all Statins but some cause cough and pain in legs.   Tape Other (See Comments)    Redness, please use paper tape     REVIEW OF SYSTEMS:  [X]  denotes positive finding, [ ]  denotes negative finding Cardiac  Comments:  Chest pain or chest pressure:    Shortness of breath upon exertion:    Short of breath when lying flat:    Irregular heart rhythm:        Vascular    Pain in calf, thigh, or hip brought on by ambulation:    Pain in feet at night that wakes you up from your sleep:     Blood clot in your veins:    Leg swelling:         Pulmonary    Oxygen at home:    Productive cough:     Wheezing:  Neurologic    Sudden weakness in arms or legs:     Sudden numbness in arms or legs:     Sudden onset of difficulty speaking or slurred speech:    Temporary loss of vision in one eye:     Problems with dizziness:         Gastrointestinal    Blood in stool:     Vomited blood:         Genitourinary    Burning when urinating:     Blood in urine:        Psychiatric    Major depression:         Hematologic    Bleeding problems:    Problems with blood clotting too easily:         Skin    Rashes or ulcers:        Constitutional    Fever or chills:      PHYSICAL EXAMINATION:  Vitals:   06/14/23 1434 06/14/23 1436  BP: (!) 165/74 (!) 173/67  Pulse: 94   Resp: 18   Temp: 97.9 F (36.6 C)   TempSrc: Temporal   SpO2: 94%   Weight: 217 lb 6.4 oz (98.6 kg)   Height: 5' 2 (1.575 m)     General:  WDWN in NAD; vital signs documented above Gait: Normal HENT: WNL, normocephalic Pulmonary: normal non-labored breathing , without  wheezing Cardiac: regular HR Abdomen: soft Vascular Exam/Pulses: 2+ radial, 2+ DP pulses bilaterally Extremities: without ischemic changes, without Gangrene , without cellulitis; without open wounds;  Musculoskeletal: no muscle wasting or atrophy  Neurologic: A&O X 3 Psychiatric:  The pt has Normal affect.   Non-Invasive Vascular Imaging:   VAS US  Carotid Duplex: Summary:  Right Carotid: Right ICA stent velocities suggest a 50-75% stenosis.   Left Carotid: Velocities in the left ICA are consistent with a 1-39% stenosis.   Vertebrals: Bilateral vertebral arteries demonstrate antegrade flow.    ASSESSMENT/PLAN:: 73 y.o. female here for routine post operative follow up. She recently underwent Right TCAR on 03/19/23 by Dr. Serene for asymptomatic high grade stenosis. She has no history of TIA or stroke. She remains without any associated symptoms.  -Her duplex today remains unchanged from her prior in November. She is still showing 50-75% stenosis in her right ICA stent. Left ICA with 1-39% stenosis. Normal flow in the vertebral arteries bilaterally - Continue Statin and Plavix . Recommend restarting 81 mg Aspirin   - She will follow up in 6 months with repeat carotid duplex    Teretha Damme, PA-C [facility] [phone]  Clinic MD:   Serene

## 2023-06-21 ENCOUNTER — Ambulatory Visit: Payer: Medicare Other | Admitting: Surgery

## 2023-06-22 ENCOUNTER — Other Ambulatory Visit: Payer: Self-pay

## 2023-06-22 ENCOUNTER — Ambulatory Visit (INDEPENDENT_AMBULATORY_CARE_PROVIDER_SITE_OTHER): Payer: Medicare Other | Admitting: *Deleted

## 2023-06-22 DIAGNOSIS — E538 Deficiency of other specified B group vitamins: Secondary | ICD-10-CM | POA: Diagnosis not present

## 2023-06-22 DIAGNOSIS — I6523 Occlusion and stenosis of bilateral carotid arteries: Secondary | ICD-10-CM

## 2023-06-22 NOTE — Progress Notes (Signed)
 Patient in today for monthly B 12 injection. Tolerated well.

## 2023-07-13 ENCOUNTER — Encounter: Payer: Self-pay | Admitting: Family Medicine

## 2023-07-13 ENCOUNTER — Ambulatory Visit (INDEPENDENT_AMBULATORY_CARE_PROVIDER_SITE_OTHER): Payer: Medicare Other | Admitting: Family Medicine

## 2023-07-13 VITALS — BP 137/61 | HR 60 | Temp 98.2°F | Ht 62.0 in | Wt 215.0 lb

## 2023-07-13 DIAGNOSIS — Z7985 Long-term (current) use of injectable non-insulin antidiabetic drugs: Secondary | ICD-10-CM | POA: Diagnosis not present

## 2023-07-13 DIAGNOSIS — E1169 Type 2 diabetes mellitus with other specified complication: Secondary | ICD-10-CM | POA: Diagnosis not present

## 2023-07-13 DIAGNOSIS — I152 Hypertension secondary to endocrine disorders: Secondary | ICD-10-CM | POA: Diagnosis not present

## 2023-07-13 DIAGNOSIS — E1159 Type 2 diabetes mellitus with other circulatory complications: Secondary | ICD-10-CM | POA: Diagnosis not present

## 2023-07-13 DIAGNOSIS — E119 Type 2 diabetes mellitus without complications: Secondary | ICD-10-CM | POA: Diagnosis not present

## 2023-07-13 DIAGNOSIS — R21 Rash and other nonspecific skin eruption: Secondary | ICD-10-CM

## 2023-07-13 DIAGNOSIS — E785 Hyperlipidemia, unspecified: Secondary | ICD-10-CM

## 2023-07-13 LAB — BAYER DCA HB A1C WAIVED: HB A1C (BAYER DCA - WAIVED): 6.2 % — ABNORMAL HIGH (ref 4.8–5.6)

## 2023-07-13 MED ORDER — TRIAMCINOLONE ACETONIDE 0.1 % EX CREA
1.0000 | TOPICAL_CREAM | Freq: Two times a day (BID) | CUTANEOUS | 0 refills | Status: DC
Start: 2023-07-13 — End: 2024-04-18

## 2023-07-13 NOTE — Progress Notes (Addendum)
Subjective: CC:DM PCP: Raliegh Ip, DO ZOX:[id number] Heikes is a 73 y.o. female presenting to clinic today for:  1. Type 2 Diabetes with hypertension, hyperlipidemia:  She reports compliance with her Ozempic 0.5 mg weekly, metformin 1000 mg twice daily, Mevacor 40 mg daily, metoprolol extended release 50 mg daily, losartan 25 mg daily, glipizide 5 mg daily, Zetia 10 mg daily  Diabetes Health Maintenance Due  Topic Date Due   HEMOGLOBIN A1C  09/14/2023   OPHTHALMOLOGY EXAM  09/29/2023   FOOT EXAM  03/01/2024    Last A1c:  Lab Results  Component Value Date   HGBA1C 5.8 (H) 03/16/2023    ROS: No chest pain, falls.  She does have some edema today but will take a fluid pill when she gets home.  She does have some easy fatigability when she is walking.  This seems to be alleviated by utilization of a buggy.  No recurrent A-fib with RVR since hospitalization  2.  Rash She reports that she developed a rash over a week ago along the right side of her neck.  She attributed this to a coat that she had worn but she admits that she had had a carotid ultrasound done on the same day so not sure where this rash actually began but is not gotten better and it is itchy and irritating  ROS: Per HPI  Allergies  Allergen Reactions   Jardiance [Empagliflozin] Other (See Comments)    RECURRENT VAGINITIS   Quinapril Hcl Cough   Statins Other (See Comments)    Not all Statins but some cause cough and pain in legs.   Tape Other (See Comments)    Redness, please use "paper" tape   Past Medical History:  Diagnosis Date   Anemia    Arthritis    Atrial fibrillation, rapid (HCC) 10/27/2013   Bilateral carotid artery disease (HCC)    Complication of anesthesia    difficult intubation in past   Diabetes mellitus without complication (HCC)    type 2   Difficult intubation    states 'lady that did the sleep study told me I have the smallest airway she has ever seen in an adult"    Dyslipidemia 06/29/2017   Heart murmur    History of blood transfusion 09/2019   GI bleed - in [place]   Hypertension    Hypothyroid    Obesity    Obstructive sleep apnea    occasional uses CPAP   Peripheral vascular disease (HCC)    Persistent atrial fibrillation (HCC) 01/16/2020   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" - xrays were negative   Upper GI bleed 09/25/2019    Current Outpatient Medications:    Accu-Chek Softclix Lancets lancets, Test BS daily Dx E11.69, Disp: 100 each, Rfl: 3   aspirin EC 81 MG tablet, Take 81 mg by mouth daily., Disp: , Rfl:    bisacodyl (DULCOLAX) 5 MG EC tablet, Take 5 mg by mouth daily as needed (constipation)., Disp: , Rfl:    clopidogrel (PLAVIX) 75 MG tablet, Take 1 tablet (75 mg total) by mouth daily., Disp: 30 tablet, Rfl: 6   diltiazem (CARDIZEM CD) 180 MG 24 hr capsule, TAKE 1 CAPSULE BY MOUTH EVERY DAY, Disp: 90 capsule, Rfl: 2   diltiazem (CARDIZEM) 30 MG tablet, Take 30 mg by mouth 3 (three) times daily as needed (afib)., Disp: , Rfl:    diphenhydramine-acetaminophen (TYLENOL PM) 25-500 MG TABS tablet, Take 1 tablet by mouth  at bedtime as needed (Pain)., Disp: , Rfl:    ezetimibe (ZETIA) 10 MG tablet, TAKE 1 TABLET BY MOUTH EVERY DAY, Disp: 90 tablet, Rfl: 3   flecainide (TAMBOCOR) 100 MG tablet, TAKE 1 TABLET BY MOUTH TWICE A DAY, Disp: 180 tablet, Rfl: 3   furosemide (LASIX) 20 MG tablet, Take 1 tablet (20 mg total) by mouth daily as needed for fluid., Disp: 90 tablet, Rfl: 3   gabapentin (NEURONTIN) 300 MG capsule, Take 1 capsule (300 mg total) by mouth 2 (two) times daily as needed (neuropathy right leg)., Disp: 180 capsule, Rfl: 3   glipiZIDE (GLUCOTROL) 5 MG tablet, Take 1 tablet (5 mg total) by mouth daily before breakfast. Dose reduction, Disp: 90 tablet, Rfl: 3   glucose blood (ACCU-CHEK GUIDE) test strip, Test BS daily Dx E11.69, Disp: 100 each, Rfl: 3   HYDROcodone-acetaminophen (NORCO) 5-325 MG tablet, Take 1  tablet by mouth every 6 (six) hours as needed for severe pain (pain score 7-10)., Disp: 10 tablet, Rfl: 0   levonorgestrel (MIRENA) 20 MCG/DAY IUD, 1 each by Intrauterine route once., Disp: , Rfl:    levothyroxine (SYNTHROID) 112 MCG tablet, TAKE 1 TABLET BY MOUTH EVERY OTHER DAY, ALTERNATING WITH 1 & 1/2 TABLETS EVERY OTHER DAY, Disp: 112 tablet, Rfl: 3   linaclotide (LINZESS) 72 MCG capsule, Take 72 mcg by mouth daily before breakfast., Disp: , Rfl:    losartan (COZAAR) 25 MG tablet, TAKE 1 TABLET (25 MG TOTAL) BY MOUTH DAILY., Disp: 90 tablet, Rfl: 3   lovastatin (MEVACOR) 40 MG tablet, TAKE 1 TABLET BY MOUTH EVERY DAY IN THE EVENING, Disp: 90 tablet, Rfl: 3   metFORMIN (GLUCOPHAGE) 1000 MG tablet, Take 1 tablet (1,000 mg total) by mouth 2 (two) times daily with a meal. (Patient taking differently: Take 500 mg by mouth 2 (two) times daily with a meal.), Disp: 180 tablet, Rfl: 3   polyethylene glycol (MIRALAX / GLYCOLAX) 17 g packet, Take 17 g by mouth daily as needed for moderate constipation., Disp: , Rfl:    psyllium (METAMUCIL) 58.6 % packet, Take 1 packet by mouth daily as needed (Constipation)., Disp: , Rfl:    rOPINIRole (REQUIP) 0.5 MG tablet, Take 1 tablet (0.5 mg total) by mouth at bedtime., Disp: 90 tablet, Rfl: 3   Semaglutide,0.25 or 0.5MG /DOS, 2 MG/3ML SOPN, Inject 0.5 mg into the skin once a week., Disp: 3 mL, Rfl:    metoprolol succinate (TOPROL-XL) 50 MG 24 hr tablet, Take 1 tablet (50 mg total) by mouth at bedtime. Hold if systolic BP  less than 100 or HR less than 50 bpm, Disp: 90 tablet, Rfl: 3  Current Facility-Administered Medications:    cyanocobalamin ((VITAMIN B-12)) injection 1,000 mcg, 1,000 mcg, Intramuscular, Q30 days, Elenora Gamma, MD, 1,000 mcg at 06/22/23 1110 Social History   Socioeconomic History   Marital status: Single    Spouse name: Not on file   Number of children: Not on file   Years of education: Not on file   Highest education level: Not on file   Occupational History   Not on file  Tobacco Use   Smoking status: Never    Passive exposure: Never   Smokeless tobacco: Never  Vaping Use   Vaping status: Never Used  Substance and Sexual Activity   Alcohol use: No    Alcohol/week: 0.0 standard drinks of alcohol   Drug use: Never   Sexual activity: Not Currently    Birth control/protection: Post-menopausal  Other Topics Concern  Not on file  Social History Narrative   Not on file   Social Drivers of Health   Financial Resource Strain: Low Risk  (04/21/2022)   Overall Financial Resource Strain (CARDIA)    Difficulty of Paying Living Expenses: Not hard at all  Food Insecurity: No Food Insecurity (03/22/2023)   Hunger Vital Sign    Worried About Running Out of Food in the Last Year: Never true    Ran Out of Food in the Last Year: Never true  Transportation Needs: No Transportation Needs (03/22/2023)   PRAPARE - Administrator, Civil Service (Medical): No    Lack of Transportation (Non-Medical): No  Physical Activity: Inactive (04/21/2022)   Exercise Vital Sign    Days of Exercise per Week: 0 days    Minutes of Exercise per Session: 0 min  Stress: No Stress Concern Present (04/21/2022)   [employer] of Occupational Health - Occupational Stress Questionnaire    Feeling of Stress : Not at all  Social Connections: Moderately Integrated (04/21/2022)   Social Connection and Isolation Panel [NHANES]    Frequency of Communication with Friends and Family: More than three times a week    Frequency of Social Gatherings with Friends and Family: Twice a week    Attends Religious Services: More than 4 times per year    Active Member of [employer] or Organizations: Yes    Attends Engineer, structural: More than 4 times per year    Marital Status: Never married  Intimate Partner Violence: Not At Risk (03/19/2023)   Humiliation, Afraid, Rape, and Kick questionnaire    Fear of Current or Ex-Partner: No     Emotionally Abused: No    Physically Abused: No    Sexually Abused: No   Family History  Problem Relation Age of Onset   COPD Father    Heart failure Father    Heart disease Father    Emphysema Father    Arrhythmia Sister    Arrhythmia Sister    Arrhythmia Sister        had PPM also   Cancer Sister     Objective: Office vital signs reviewed. BP 137/61   Pulse 60   Temp 98.2 F (36.8 C)   Ht 5\' 2"  (1.575 m)   Wt 215 lb (97.5 kg)   SpO2 93%   BMI 39.32 kg/m   Physical Examination:  General: Awake, alert, morbidly obese, No acute distress HEENT: Sclera white.  Moist mucous membranes Cardio: regular rate and rhythm, S1S2 heard, blowing murmur appreciated Pulm: clear to auscultation bilaterally, no wheezes, rhonchi or rales; normal work of breathing on room air Skin: Erythematous, patchy maculopapular rash noted along the right neck that is approximately 3 inches in length and about  1 and half inches in width   Assessment/ Plan: 73 y.o. female   Diabetes mellitus treated with injections of non-insulin medication (HCC) - Plan: Bayer DCA Hb A1c Waived  Hypertension associated with diabetes (HCC)  Hyperlipidemia associated with type 2 diabetes mellitus (HCC)  Rash - Plan: triamcinolone cream (KENALOG) 0.1 %  Check A1c.  Discussed consideration for advancement of Ozempic given persistent issue with weight which is certainly contributing to physical deconditioning and strain.  She declined this today and is going to work on spiritual strength to reduce overeating and gluttonous behaviors.  I did give her handicap placard today  Her blood pressure is well-controlled.  No changes  Lipid panel not due.  Continue statin, Zetia  Triamcinolone given for rash along neck.  Encouraged her to contact me if this does not get better.     Raliegh Ip, DO [facility] [phone]

## 2023-07-13 NOTE — Addendum Note (Signed)
Addended by: Raliegh Ip on: 07/13/2023 12:29 PM   Modules accepted: Orders, Level of Service

## 2023-07-20 ENCOUNTER — Ambulatory Visit (INDEPENDENT_AMBULATORY_CARE_PROVIDER_SITE_OTHER): Payer: Medicare Other | Admitting: *Deleted

## 2023-07-20 DIAGNOSIS — E538 Deficiency of other specified B group vitamins: Secondary | ICD-10-CM | POA: Diagnosis not present

## 2023-07-20 NOTE — Progress Notes (Signed)
Pt given B12 injection IM left deltoid and tolerated well. °

## 2023-07-29 ENCOUNTER — Telehealth: Payer: Self-pay | Admitting: Cardiology

## 2023-07-29 NOTE — Telephone Encounter (Signed)
 Received outpatient call from patient regarding fluctuations in her HR today. This morning noted HR in the 80s, but has had episodes into the 130s intermittent throughout the day. Overall, asymptomatic. Has hx of atrial fibrillation s/p watchman, not on OAC. May be having recurrent episodes of afib. Has taken her flecainide, Diltiazem already for the day. Instructed to go ahead and take Toprol 50mg  now as BP in the 120s systolic. Advised of ER precautions. Instructed if she continues to have issues with HR to reach back out in the morning as she may need office visit/EKG to check rhythm. She voiced understanding and thanked me for callback.

## 2023-07-30 ENCOUNTER — Telehealth: Payer: Self-pay | Admitting: Internal Medicine

## 2023-07-30 NOTE — Telephone Encounter (Signed)
 Patient identification verified by 2 forms. Marilynn Rail, RN    Called and spoke to patient  Patient states:   -this morning heart rate was ~60  -BP this morning 132/59 HR 59  -symptoms did not persist last night   -so far today she is not experiencing symptoms from yesterday  Patient denies:   -heart palpitations   -SOB/difficulty breathing   -chest pressure/pain  Advised patient to continue monitor symptoms at home  6 month follow up scheduled for 6/17 at 1:40pm  Reviewed ED warning signs/precautions  Patient verbalized understanding, no questions at this time

## 2023-08-02 ENCOUNTER — Other Ambulatory Visit: Payer: Self-pay | Admitting: Surgery

## 2023-08-24 ENCOUNTER — Ambulatory Visit (INDEPENDENT_AMBULATORY_CARE_PROVIDER_SITE_OTHER): Payer: Medicare Other | Admitting: *Deleted

## 2023-08-24 DIAGNOSIS — E538 Deficiency of other specified B group vitamins: Secondary | ICD-10-CM

## 2023-08-24 NOTE — Progress Notes (Signed)
 Patient is in office today for a nurse visit for B12 Injection. Patient Injection was given in the  Right deltoid. Patient tolerated injection well.

## 2023-09-24 ENCOUNTER — Ambulatory Visit

## 2023-09-28 ENCOUNTER — Ambulatory Visit (INDEPENDENT_AMBULATORY_CARE_PROVIDER_SITE_OTHER)

## 2023-09-28 DIAGNOSIS — E538 Deficiency of other specified B group vitamins: Secondary | ICD-10-CM

## 2023-09-28 NOTE — Progress Notes (Signed)
 Patient is in office today for a nurse visit for B12 Injection. Patient Injection was given in the  Left deltoid. Patient tolerated injection well.

## 2023-10-07 ENCOUNTER — Telehealth: Payer: Self-pay | Admitting: Cardiology

## 2023-10-07 NOTE — Telephone Encounter (Signed)
 Spoke to patient she stated she was watching TV this morning she felt funny so she checked pulse with pulse ox -120. No chest pain.No sob.Stated all day pulse is ranging 60 to 134.She did take Diltiazem  30 mg about 1 hour ago.Pulse at present 105 B/P 126/76.She will take Metoprolol  Succ 50 mg at bedtime.If she wakes up during night and pulse is greater than 100 she can take Diltiaxem 30 mg. Appointment scheduled with Dr[facility]Hochrein at [facility] office 5/21 at 10:40 am.Advised if symptoms worsen she needs to go to ED to be evaluated. She wanted to ask Dr.Hochrein if she needs to repeat echo before appointment scheduled with him 6/17.I will send message to him for advice

## 2023-10-07 NOTE — Telephone Encounter (Signed)
 Attempted to call patient, no answer left message requesting a call back.

## 2023-10-07 NOTE — Telephone Encounter (Signed)
 Patient returned RN's call.  Patient noted she took a dose of diltiazem  (CARDIZEM ) 30 MG tablet as her HR was as high as 124.  Patient wants advice on next steps.

## 2023-10-07 NOTE — Telephone Encounter (Signed)
 New Message:      This message was left with the Answering Service Please give me a call, my heart rate is up No other details were left with the answering service today(10-07-23)  @ 1:26 PM

## 2023-10-08 NOTE — Telephone Encounter (Signed)
 Called patient left message on personal voice mail Dr.Hochrein advised echo not needed before appointment.

## 2023-10-11 ENCOUNTER — Telehealth: Payer: Self-pay

## 2023-10-11 NOTE — Telephone Encounter (Signed)
 Attempted to call pt to r/s apt for 05/13 due to provider being out this week      Please get pt r/s if she calls back

## 2023-10-12 ENCOUNTER — Ambulatory Visit: Payer: Medicare Other | Admitting: Family Medicine

## 2023-10-18 NOTE — Progress Notes (Signed)
 Cardiology Office Note:   Date:  10/20/2023  ID:  Emily [name], DOB Jun 18, 1950, MRN [id number] PCP: Eliodoro Guerin, DO  [facility] Providers Cardiologist:  Eilleen Grates, MD Electrophysiologist:  Will Cortland Ding, MD {  History of Present Illness:   Emily [name] is a 73 y.o. female who presents for the patient has paroxysmal atrial fibrillation and was treated with flecainide . She has had recurrent GI bleeding and could not tolerate anticoagulation. She has had transfusions. She subsequently had double-balloon enteroscopy. 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 She did have Watchman.   She also had a follow up echo on 02/22/23 and had a normal EF with moderate aortic stenosis and a gradient of 24.2 mean.   She had carotid stenting.    She called the other day because she was watching TV and she felt funny.  She checked her heart rate and it was in the 130s.  She took an extra 30 of diltiazem  and her heart rate went down to the 100s.  This is really symptoms a few times she has had to take Cardizem  for which she knows to be paroxysmal A-fib.  This happens relatively infrequently.  She is otherwise been doing about the same.  Her blood pressure seems to be a little more elevated.  She shows me some intermittent blood pressures that she has taken recently.  She is not having any presyncope or syncope.  She has some weakness but she is actually little bit better than she was about a year ago.  She is able to get out in the yard and do a little activity.  She is not having any new chest pressure, neck or arm discomfort.  She is not having any new shortness of breath, PND or orthopnea.  She has some chronic left greater than right lower extremity edema which is unchanged.  ROS: As stated in the HPI and negative for all other systems.  Studies Reviewed:    EKG:   Sinus rhythm, rate 60, right bundle branch block and left anterior fascicular block  unchanged from previous, June 03, 2023  Risk Assessment/Calculations:    CHA2DS2-VASc Score = 4   This indicates a 4.8% annual risk of stroke. The patient's score is based upon: CHF History: 0 HTN History: 1 Diabetes History: 0 Stroke History: 0 Vascular Disease History: 1 Age Score: 1 Gender Score: 1    Physical Exam:   VS:  BP 134/86   Pulse 67   Ht 5\' 3"  (1.6 m)   Wt 206 lb (93.4 kg)   SpO2 98%   BMI 36.49 kg/m    Wt Readings from Last 3 Encounters:  10/20/23 206 lb (93.4 kg)  07/13/23 215 lb (97.5 kg)  06/14/23 217 lb 6.4 oz (98.6 kg)     GEN: Well nourished, well developed in no acute distress NECK: No JVD; bilateral carotid bruits CARDIAC: RRR, she has 6 apical systolic murmur radiating at the aortic outflow tract, no diastolic murmurs, rubs, gallops RESPIRATORY:  Clear to auscultation without rales, wheezing or rhonchi  ABDOMEN: Soft, non-tender, non-distended EXTREMITIES:  Left greater than right leg edema,  No deformity   ASSESSMENT AND PLAN:   Carotid stenosis:   She is status post carotid stenting.  She will continue with aggressive risk reduction.    Bradycardia: She has had no new symptoms related to this.  She had a monitor in October last year without any sleep bradycardia arrhythmias.  She  is aware to let me know or call 911 if she has any presyncope or syncope.   AS:  This was moderate on echo in 2024.  I will follow-up with an echo this fall.  She has no new symptoms related to this.   PAF:   She seems to be doing well on flecainide .   She has a Youth worker.  She is having infrequent paroxysms of A-fib and does not want to consider any change in therapy.  I do not think this is a failure of flecainide .   Leg swelling: Her leg swelling is unchanged.  No change in therapy.  Hypertension: Her blood pressure is elevated and she has been keeping a blood pressure diary.  She will probably need med adjustments.    Follow up with me in the fall.    Signed, Eilleen Grates, MD

## 2023-10-20 ENCOUNTER — Telehealth: Payer: Self-pay | Admitting: Family Medicine

## 2023-10-20 ENCOUNTER — Ambulatory Visit: Admitting: Cardiology

## 2023-10-20 ENCOUNTER — Encounter: Payer: Self-pay | Admitting: Cardiology

## 2023-10-20 VITALS — BP 134/86 | HR 67 | Ht 63.0 in | Wt 206.0 lb

## 2023-10-20 DIAGNOSIS — M7989 Other specified soft tissue disorders: Secondary | ICD-10-CM | POA: Diagnosis not present

## 2023-10-20 DIAGNOSIS — I350 Nonrheumatic aortic (valve) stenosis: Secondary | ICD-10-CM | POA: Diagnosis not present

## 2023-10-20 DIAGNOSIS — I6529 Occlusion and stenosis of unspecified carotid artery: Secondary | ICD-10-CM

## 2023-10-20 DIAGNOSIS — I480 Paroxysmal atrial fibrillation: Secondary | ICD-10-CM | POA: Diagnosis not present

## 2023-10-20 DIAGNOSIS — R001 Bradycardia, unspecified: Secondary | ICD-10-CM | POA: Diagnosis not present

## 2023-10-20 NOTE — Telephone Encounter (Signed)
 Left detailed message for patient to call back to let us  know if they currently see an eye doctor to have the health of the eyes checked with having diabetes and if not, we do offer retinal imaging appointments here, to check the health of our patients eyes, if she would like to schedule an appt.

## 2023-10-20 NOTE — Patient Instructions (Signed)
 Medication Instructions:  The current medical regimen is effective;  continue present plan and medications.  *If you need a refill on your cardiac medications before your next appointment, please call your pharmacy*  Testing/Procedures: Your physician has requested that you have an echocardiogram. Echocardiography is a painless test that uses sound waves to create images of your heart. It provides your doctor with information about the size and shape of your heart and how well your heart's chambers and valves are working. This procedure takes approximately one hour. There are no restrictions for this procedure. Please do NOT wear cologne, perfume, aftershave, or lotions (deodorant is allowed). Please arrive 15 minutes prior to your appointment time.  Please note: We ask at that you not bring children with you during ultrasound (echo/ vascular) testing. Due to room size and safety concerns, children are not allowed in the ultrasound rooms during exams. Our front office staff cannot provide observation of children in our lobby area while testing is being conducted. An adult accompanying a patient to their appointment will only be allowed in the ultrasound room at the discretion of the ultrasound technician under special circumstances. We apologize for any inconvenience.  You will be contacted to be scheduled for this testing to be completed at [facility].  Please keep a 10 day blood pressure diary an return it to the [place] office.  Follow-Up: At [facility] you and your health needs are our priority.  As part of our continuing mission to provide you with exceptional heart care, our providers are all part of one team.  This team includes your primary Cardiologist (physician) and Advanced Practice Providers or APPs (Physician Assistants and Nurse Practitioners) who all work together to provide you with the care you need, when you need it.  Your next appointment:   4 month(s)  Provider:    Eilleen Grates, MD    We recommend signing up for the patient portal called "MyChart".  Sign up information is provided on this After Visit Summary.  MyChart is used to connect with patients for Virtual Visits (Telemedicine).  Patients are able to view lab/test results, encounter notes, upcoming appointments, etc.  Non-urgent messages can be sent to your provider as well.   To learn more about what you can do with MyChart, go to [email or web address].

## 2023-10-26 ENCOUNTER — Other Ambulatory Visit: Payer: Self-pay | Admitting: Cardiology

## 2023-10-26 DIAGNOSIS — I480 Paroxysmal atrial fibrillation: Secondary | ICD-10-CM

## 2023-11-02 ENCOUNTER — Ambulatory Visit (INDEPENDENT_AMBULATORY_CARE_PROVIDER_SITE_OTHER)

## 2023-11-02 DIAGNOSIS — Z1231 Encounter for screening mammogram for malignant neoplasm of breast: Secondary | ICD-10-CM | POA: Diagnosis not present

## 2023-11-02 DIAGNOSIS — E538 Deficiency of other specified B group vitamins: Secondary | ICD-10-CM | POA: Diagnosis not present

## 2023-11-02 LAB — HM MAMMOGRAPHY

## 2023-11-02 NOTE — Progress Notes (Signed)
 Patient is in office today for a nurse visit for B12 Injection. Patient Injection was given in the  Right deltoid. Patient tolerated injection well.

## 2023-11-03 ENCOUNTER — Encounter: Payer: Self-pay | Admitting: Cardiology

## 2023-11-08 NOTE — Progress Notes (Unsigned)
 Subjective: CC:DM PCP: Emily [name], Emily [name] [id number] Lucken is a 73 y.o. female presenting to clinic today for:  1. Type 2 Diabetes with hypertension, hyperlipidemia:  Reports compliance with Ozempic  0.5 mg weekly which is obtained through patient assistance program.  She takes 500 mg of metformin  twice daily, glipizide  5 mg daily with breakfast.  This was a dose reduction since last visit.  She is compliant with her blood pressure and cholesterol medications.    Diabetes Health Maintenance Due  Topic Date Due   OPHTHALMOLOGY EXAM  11/09/2023 (Originally 09/29/2023)   HEMOGLOBIN A1C  01/10/2024   FOOT EXAM  03/01/2024    Last A1c:  Lab Results  Component Value Date   HGBA1C 6.2 (H) 07/13/2023    ROS: No chest pain or shortness of breath reported.  2. Hypothyroidism She has had a couple of runs of atrial fibrillation with heart rates down into the 120s but that resolved with use of Cardizem  30 mg.  She reports some constipation related to her irritable bowel syndrome but otherwise is doing okay.  She has tried Linzess  72 mcg, which was prescribed by her gastroenterologist previously but that was not helpful.  She is been utilizing MiraLAX .  Denies any rectal bleeding, nausea or vomiting   ROS: Per HPI  Allergies  Allergen Reactions   Jardiance  [Empagliflozin ] Other (See Comments)    RECURRENT VAGINITIS   Quinapril Hcl Cough   Statins Other (See Comments)    Not all Statins but some cause cough and pain in legs.   Tape Other (See Comments)    Redness, please use "paper" tape   Past Medical History:  Diagnosis Date   Anemia    Arthritis    Atrial fibrillation, rapid (HCC) 10/27/2013   Bilateral carotid artery disease (HCC)    Complication of anesthesia    difficult intubation in past   Diabetes mellitus without complication (HCC)    type 2   Difficult intubation    states 'lady that did the sleep study told me I have the smallest airway she has ever seen in  an adult"   Dyslipidemia 06/29/2017   Heart murmur    History of blood transfusion 09/2019   GI bleed - in [place]   Hypertension    Hypothyroid    Obesity    Obstructive sleep apnea    occasional uses CPAP   Peripheral vascular disease (HCC)    Persistent atrial fibrillation (HCC) 01/16/2020   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" - xrays were negative   Upper GI bleed 09/25/2019    Current Outpatient Medications:    Accu-Chek Softclix Lancets lancets, Test BS daily Dx E11.69, Disp: 100 each, Rfl: 3   bisacodyl  (DULCOLAX) 5 MG EC tablet, Take 5 mg by mouth daily as needed (constipation)., Disp: , Rfl:    clopidogrel  (PLAVIX ) 75 MG tablet, TAKE 1 TABLET BY MOUTH EVERY DAY, Disp: 90 tablet, Rfl: 2   diltiazem  (CARDIZEM  CD) 180 MG 24 hr capsule, TAKE 1 CAPSULE BY MOUTH EVERY DAY, Disp: 90 capsule, Rfl: 3   diltiazem  (CARDIZEM ) 30 MG tablet, Take 30 mg by mouth 3 (three) times daily as needed (afib)., Disp: , Rfl:    diphenhydramine -acetaminophen  (TYLENOL  PM) 25-500 MG TABS tablet, Take 1 tablet by mouth at bedtime as needed (Pain)., Disp: , Rfl:    ezetimibe  (ZETIA ) 10 MG tablet, TAKE 1 TABLET BY MOUTH EVERY DAY, Disp: 90 tablet, Rfl: 3   flecainide  (TAMBOCOR ) 100  MG tablet, TAKE 1 TABLET BY MOUTH TWICE A DAY, Disp: 180 tablet, Rfl: 3   furosemide  (LASIX ) 20 MG tablet, Take 1 tablet (20 mg total) by mouth daily as needed for fluid., Disp: 90 tablet, Rfl: 3   gabapentin  (NEURONTIN ) 300 MG capsule, Take 1 capsule (300 mg total) by mouth 2 (two) times daily as needed (neuropathy right leg)., Disp: 180 capsule, Rfl: 3   glipiZIDE  (GLUCOTROL ) 5 MG tablet, Take 1 tablet (5 mg total) by mouth daily before breakfast. Dose reduction, Disp: 90 tablet, Rfl: 3   glucose blood (ACCU-CHEK GUIDE) test strip, Test BS daily Dx E11.69, Disp: 100 each, Rfl: 3   HYDROcodone -acetaminophen  (NORCO) 5-325 MG tablet, Take 1 tablet by mouth every 6 (six) hours as needed for severe pain (pain  score 7-10)., Disp: 10 tablet, Rfl: 0   levonorgestrel (MIRENA) 20 MCG/DAY IUD, 1 each by Intrauterine route once., Disp: , Rfl:    levothyroxine  (SYNTHROID ) 112 MCG tablet, TAKE 1 TABLET BY MOUTH EVERY OTHER DAY, ALTERNATING WITH 1 & 1/2 TABLETS EVERY OTHER DAY, Disp: 112 tablet, Rfl: 3   linaclotide  (LINZESS ) 72 MCG capsule, Take 72 mcg by mouth daily before breakfast., Disp: , Rfl:    losartan  (COZAAR ) 25 MG tablet, TAKE 1 TABLET (25 MG TOTAL) BY MOUTH DAILY., Disp: 90 tablet, Rfl: 3   lovastatin  (MEVACOR ) 40 MG tablet, TAKE 1 TABLET BY MOUTH EVERY DAY IN THE EVENING, Disp: 90 tablet, Rfl: 3   metFORMIN  (GLUCOPHAGE ) 1000 MG tablet, Take 1 tablet (1,000 mg total) by mouth 2 (two) times daily with a meal. (Patient taking differently: Take 500 mg by mouth 2 (two) times daily with a meal.), Disp: 180 tablet, Rfl: 3   polyethylene glycol (MIRALAX  / GLYCOLAX ) 17 g packet, Take 17 g by mouth daily as needed for moderate constipation., Disp: , Rfl:    psyllium (METAMUCIL) 58.6 % packet, Take 1 packet by mouth daily as needed (Constipation)., Disp: , Rfl:    rOPINIRole  (REQUIP ) 0.5 MG tablet, Take 1 tablet (0.5 mg total) by mouth at bedtime., Disp: 90 tablet, Rfl: 3   Semaglutide ,0.25 or 0.5MG /DOS, 2 MG/3ML SOPN, Inject 0.5 mg into the skin once a week., Disp: 3 mL, Rfl:    triamcinolone  cream (KENALOG ) 0.1 %, Apply 1 Application topically 2 (two) times daily. X10 days as needed for rash, Disp: 30 g, Rfl: 0   metoprolol  succinate (TOPROL -XL) 50 MG 24 hr tablet, Take 1 tablet (50 mg total) by mouth at bedtime. Hold if systolic BP  less than 100 or HR less than 50 bpm, Disp: 90 tablet, Rfl: 3  Current Facility-Administered Medications:    cyanocobalamin  ((VITAMIN B-12)) injection 1,000 mcg, 1,000 mcg, Intramuscular, Q30 days, Emily Raja, MD, 1,000 mcg at 11/02/23 1106 Social History   Socioeconomic History   Marital status: Single    Spouse name: Not on file   Number of children: Not on file    Years of education: Not on file   Highest education level: Not on file  Occupational History   Not on file  Tobacco Use   Smoking status: Never    Passive exposure: Never   Smokeless tobacco: Never  Vaping Use   Vaping status: Never Used  Substance and Sexual Activity   Alcohol use: No    Alcohol/week: 0.0 standard drinks of alcohol   Drug use: Never   Sexual activity: Not Currently    Birth control/protection: Post-menopausal  Other Topics Concern   Not on file  Social  History Narrative   Not on file   Social Drivers of Health   Financial Resource Strain: Low Risk  (04/21/2022)   Overall Financial Resource Strain (CARDIA)    Difficulty of Paying Living Expenses: Not hard at all  Food Insecurity: No Food Insecurity (03/22/2023)   Hunger Vital Sign    Worried About Running Out of Food in the Last Year: Never true    Ran Out of Food in the Last Year: Never true  Transportation Needs: No Transportation Needs (03/22/2023)   PRAPARE - Administrator, Civil Service (Medical): No    Lack of Transportation (Non-Medical): No  Physical Activity: Inactive (04/21/2022)   Exercise Vital Sign    Days of Exercise per Week: 0 days    Minutes of Exercise per Session: 0 min  Stress: No Stress Concern Present (04/21/2022)   [employer] of Occupational Health - Occupational Stress Questionnaire    Feeling of Stress : Not at all  Social Connections: Moderately Integrated (04/21/2022)   Social Connection and Isolation Panel [NHANES]    Frequency of Communication with Friends and Family: More than three times a week    Frequency of Social Gatherings with Friends and Family: Twice a week    Attends Religious Services: More than 4 times per year    Active Member of [employer] or Organizations: Yes    Attends Engineer, structural: More than 4 times per year    Marital Status: Never married  Intimate Partner Violence: Not At Risk (03/19/2023)   Humiliation, Afraid, Rape,  and Kick questionnaire    Fear of Current or Ex-Partner: No    Emotionally Abused: No    Physically Abused: No    Sexually Abused: No   Family History  Problem Relation Age of Onset   COPD Father    Heart failure Father    Heart disease Father    Emphysema Father    Arrhythmia Sister    Arrhythmia Sister    Arrhythmia Sister        had PPM also   Cancer Sister     Objective: Office vital signs reviewed. BP 137/65   Pulse 60   Temp 98.5 F (36.9 C)   Ht 5\' 3"  (1.6 m)   Wt 208 lb (94.3 kg)   SpO2 95%   BMI 36.85 kg/m   Physical Examination:  General: Awake, alert, well nourished, No acute distress HEENT: sclera white, MMM Cardio: Bradycardic rate and regular rhythm, S1S2 heard, + blowing murmurs appreciated Pulm: clear to auscultation bilaterally, no wheezes, rhonchi or rales; normal work of breathing on room air  Assessment/ Plan: 73 y.o. female   Diabetes mellitus treated with injections of non-insulin  medication (HCC) - Plan: Bayer DCA Hb A1c Waived  Hypertension associated with diabetes (HCC)  Hyperlipidemia associated with type 2 diabetes mellitus (HCC)  Hypothyroidism due to acquired atrophy of thyroid  - Plan: TSH + free T4  Irritable bowel syndrome with constipation - Plan: linaclotide  (LINZESS ) 145 MCG CAPS capsule  A1c controlled at 6.1 today.  She will continue blood pressure and cholesterol medications as prescribed  Check thyroid  levels given constipation and recent run of A-fib  Linzess  samples given today of 145 mcg and to 90 mcg and Rx sent for 145 mcg.  Discussed how to utilize medication appropriately and she will let me know if she needs a higher dose or not  Emily [name], Emily [name] [facility] [phone]

## 2023-11-09 ENCOUNTER — Ambulatory Visit (INDEPENDENT_AMBULATORY_CARE_PROVIDER_SITE_OTHER): Admitting: Family Medicine

## 2023-11-09 ENCOUNTER — Encounter: Payer: Self-pay | Admitting: Family Medicine

## 2023-11-09 VITALS — BP 137/65 | HR 60 | Temp 98.5°F | Ht 63.0 in | Wt 208.0 lb

## 2023-11-09 DIAGNOSIS — K581 Irritable bowel syndrome with constipation: Secondary | ICD-10-CM | POA: Diagnosis not present

## 2023-11-09 DIAGNOSIS — I152 Hypertension secondary to endocrine disorders: Secondary | ICD-10-CM

## 2023-11-09 DIAGNOSIS — E1159 Type 2 diabetes mellitus with other circulatory complications: Secondary | ICD-10-CM

## 2023-11-09 DIAGNOSIS — Z7985 Long-term (current) use of injectable non-insulin antidiabetic drugs: Secondary | ICD-10-CM

## 2023-11-09 DIAGNOSIS — E785 Hyperlipidemia, unspecified: Secondary | ICD-10-CM | POA: Diagnosis not present

## 2023-11-09 DIAGNOSIS — E034 Atrophy of thyroid (acquired): Secondary | ICD-10-CM | POA: Diagnosis not present

## 2023-11-09 DIAGNOSIS — E1169 Type 2 diabetes mellitus with other specified complication: Secondary | ICD-10-CM

## 2023-11-09 DIAGNOSIS — E119 Type 2 diabetes mellitus without complications: Secondary | ICD-10-CM | POA: Diagnosis not present

## 2023-11-09 LAB — BAYER DCA HB A1C WAIVED: HB A1C (BAYER DCA - WAIVED): 6.1 % — ABNORMAL HIGH (ref 4.8–5.6)

## 2023-11-09 MED ORDER — LINACLOTIDE 145 MCG PO CAPS: 145.0000 ug | ORAL_CAPSULE | Freq: Every day | ORAL | 99 refills | Status: DC

## 2023-11-09 NOTE — Patient Instructions (Signed)
 Ozempic  1mg  ordered. Trial of higher dose linzess  for stool

## 2023-11-10 ENCOUNTER — Ambulatory Visit: Payer: Self-pay | Admitting: Family Medicine

## 2023-11-10 LAB — TSH+FREE T4
Free T4: 1.65 ng/dL (ref 0.82–1.77)
TSH: 0.45 u[IU]/mL (ref 0.450–4.500)

## 2023-11-11 ENCOUNTER — Other Ambulatory Visit: Payer: Self-pay | Admitting: Family Medicine

## 2023-11-11 DIAGNOSIS — K581 Irritable bowel syndrome with constipation: Secondary | ICD-10-CM

## 2023-11-12 NOTE — Telephone Encounter (Signed)
 If she has a deductible, there is not getting around that.  Only cheap option is Miralax  powder.  All of the pills are brand name only sadly.

## 2023-11-12 NOTE — Telephone Encounter (Signed)
 Pt aware. She has taken the samples given to her the last 3 days with only 1 small BM, just doesn't think this is worth spending $300 so she will try the Miralax  for awhile.

## 2023-11-16 ENCOUNTER — Ambulatory Visit: Payer: Medicare Other | Admitting: Cardiology

## 2023-11-25 ENCOUNTER — Other Ambulatory Visit (HOSPITAL_COMMUNITY)

## 2023-11-30 ENCOUNTER — Other Ambulatory Visit: Payer: Self-pay | Admitting: Family Medicine

## 2023-11-30 DIAGNOSIS — E034 Atrophy of thyroid (acquired): Secondary | ICD-10-CM

## 2023-12-06 ENCOUNTER — Telehealth: Payer: Self-pay | Admitting: Family Medicine

## 2023-12-06 ENCOUNTER — Ambulatory Visit (HOSPITAL_COMMUNITY)
Admission: RE | Admit: 2023-12-06 | Discharge: 2023-12-06 | Disposition: A | Discharge status: Home / Self Care | Payer: Medicare Other | Source: Ambulatory Visit | Attending: Surgery | Admitting: Surgery

## 2023-12-06 ENCOUNTER — Ambulatory Visit: Payer: Medicare Other | Admitting: Physician Assistant

## 2023-12-06 VITALS — BP 136/67 | HR 50 | Temp 97.9°F | Ht 63.0 in | Wt 214.9 lb

## 2023-12-06 DIAGNOSIS — I6523 Occlusion and stenosis of bilateral carotid arteries: Secondary | ICD-10-CM

## 2023-12-06 NOTE — Progress Notes (Signed)
 Office Note     CC:  follow up Requesting Provider:  Jolinda Norene HERO, DO  HPI: Emily [name] is a 73 y.o. (May 16, 1951) female who presents for routine follow up of carotid artery stenosis. She is s/p right TCAR on 03/19/23 by Dr. Serene for asymptomatic high grade stenosis. She has no history of TIA or stroke. She has remote history of left CEA in March of 2017 by Dr. Serene.   Today she reports overall doing well.  She denies any visual changes, slurred speech, facial drooping, unilateral upper or lower extremity weakness or numbness. 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 She is medically managed on Plavix  and statin. She has statin intolerance but is able to tolerate lovastatin   Past Medical History:  Diagnosis Date   Anemia    Arthritis    Atrial fibrillation, rapid (HCC) 10/27/2013   Bilateral carotid artery disease (HCC)    Complication of anesthesia    difficult intubation in past   Diabetes mellitus without complication (HCC)    type 2   Difficult intubation    states 'lady that did the sleep study told me I have the smallest airway she has ever seen in an adult   Dyslipidemia 06/29/2017   Heart murmur    History of blood transfusion 09/2019   GI bleed - in [facility]   Hypertension    Hypothyroid    Obesity    Obstructive sleep apnea    occasional uses CPAP   Peripheral vascular disease (HCC)    Persistent atrial fibrillation (HCC) 01/16/2020   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 - xrays were negative   Upper GI bleed 09/25/2019    Past Surgical History:  Procedure Laterality Date   CATARACT EXTRACTION Left    CATARACT EXTRACTION W/PHACO Right 12/09/2015   Procedure: CATARACT EXTRACTION PHACO AND INTRAOCULAR LENS PLACEMENT (IOC);  Surgeon: Cherene Mania, MD;  Location: [facility];  Service: Ophthalmology;  Laterality: Right;  CDE:10.48   COLONOSCOPY W/ POLYPECTOMY     cyst on back of neck removed      DILATION AND CURETTAGE OF UTERUS     x 5   ENDARTERECTOMY Left 08/30/2015   Procedure: LEFT CAROYID ENDARTERECTOMY WITH XENOSURE BOVINE PERICARDIUM PATCH ANGIOPLASTY;  Surgeon: Gaile LELON Serene, MD;  Location: [facility];  Service: Vascular;  Laterality: Left;   TONSILLECTOMY     TRANSCAROTID ARTERY REVASCULARIZATION  Right 03/19/2023   Procedure: Right Transcarotid Artery Revascularization;  Surgeon: Serene Gaile LELON, MD;  Location: [facility];  Service: Vascular;  Laterality: Right;   ULTRASOUND GUIDANCE FOR VASCULAR ACCESS Left 03/19/2023   Procedure: ULTRASOUND GUIDANCE FOR VASCULAR ACCESS;  Surgeon: Serene Gaile LELON, MD;  Location: [facility];  Service: Vascular;  Laterality: Left;   UPPER GI ENDOSCOPY     x several    Social History   Socioeconomic History   Marital status: Single    Spouse name: Not on file   Number of children: Not on file   Years of education: Not on file   Highest education level: Not on file  Occupational History   Not on file  Tobacco Use   Smoking status: Never    Passive exposure: Never   Smokeless tobacco: Never  Vaping Use   Vaping status: Never Used  Substance and Sexual Activity   Alcohol use: No    Alcohol/week: 0.0 standard drinks of alcohol   Drug use: Never   Sexual activity: Not Currently  Birth control/protection: Post-menopausal  Other Topics Concern   Not on file  Social History Narrative   Not on file   Social Drivers of Health   Financial Resource Strain: Low Risk  (04/21/2022)   Overall Financial Resource Strain (CARDIA)    Difficulty of Paying Living Expenses: Not hard at all  Food Insecurity: No Food Insecurity (03/22/2023)   Hunger Vital Sign    Worried About Running Out of Food in the Last Year: Never true    Ran Out of Food in the Last Year: Never true  Transportation Needs: No Transportation Needs (03/22/2023)   PRAPARE - Administrator, Civil Service (Medical): No    Lack of Transportation (Non-Medical): No   Physical Activity: Inactive (04/21/2022)   Exercise Vital Sign    Days of Exercise per Week: 0 days    Minutes of Exercise per Session: 0 min  Stress: No Stress Concern Present (04/21/2022)   [employer] of Occupational Health - Occupational Stress Questionnaire    Feeling of Stress : Not at all  Social Connections: Moderately Integrated (04/21/2022)   Social Connection and Isolation Panel    Frequency of Communication with Friends and Family: More than three times a week    Frequency of Social Gatherings with Friends and Family: Twice a week    Attends Religious Services: More than 4 times per year    Active Member of [employer] or Organizations: Yes    Attends Engineer, structural: More than 4 times per year    Marital Status: Never married  Intimate Partner Violence: Not At Risk (03/19/2023)   Humiliation, Afraid, Rape, and Kick questionnaire    Fear of Current or Ex-Partner: No    Emotionally Abused: No    Physically Abused: No    Sexually Abused: No   Family History  Problem Relation Age of Onset   COPD Father    Heart failure Father    Heart disease Father    Emphysema Father    Arrhythmia Sister    Arrhythmia Sister    Arrhythmia Sister        had PPM also   Cancer Sister     Current Outpatient Medications  Medication Sig Dispense Refill   Accu-Chek Softclix Lancets lancets Test BS daily Dx E11.69 100 each 3   bisacodyl  (DULCOLAX) 5 MG EC tablet Take 5 mg by mouth daily as needed (constipation).     clopidogrel  (PLAVIX ) 75 MG tablet TAKE 1 TABLET BY MOUTH EVERY DAY 90 tablet 2   diltiazem  (CARDIZEM  CD) 180 MG 24 hr capsule TAKE 1 CAPSULE BY MOUTH EVERY DAY 90 capsule 3   diltiazem  (CARDIZEM ) 30 MG tablet Take 30 mg by mouth 3 (three) times daily as needed (afib).     diphenhydramine -acetaminophen  (TYLENOL  PM) 25-500 MG TABS tablet Take 1 tablet by mouth at bedtime as needed (Pain).     ezetimibe  (ZETIA ) 10 MG tablet TAKE 1 TABLET BY MOUTH EVERY DAY 90  tablet 3   flecainide  (TAMBOCOR ) 100 MG tablet TAKE 1 TABLET BY MOUTH TWICE A DAY 180 tablet 3   furosemide  (LASIX ) 20 MG tablet Take 1 tablet (20 mg total) by mouth daily as needed for fluid. 90 tablet 3   gabapentin  (NEURONTIN ) 300 MG capsule Take 1 capsule (300 mg total) by mouth 2 (two) times daily as needed (neuropathy right leg). 180 capsule 3   glipiZIDE  (GLUCOTROL ) 5 MG tablet Take 1 tablet (5 mg total) by mouth daily before breakfast. Dose  reduction 90 tablet 3   glucose blood (ACCU-CHEK GUIDE) test strip Test BS daily Dx E11.69 100 each 3   HYDROcodone -acetaminophen  (NORCO) 5-325 MG tablet Take 1 tablet by mouth every 6 (six) hours as needed for severe pain (pain score 7-10). 10 tablet 0   levonorgestrel (MIRENA) 20 MCG/DAY IUD 1 each by Intrauterine route once.     levothyroxine  (SYNTHROID ) 112 MCG tablet TAKE 1 TABLET BY MOUTH EVERY OTHER DAY, ALTERNATING WITH 1 & 1/2 TABLETS EVERY OTHER DAY 112 tablet 0   linaclotide  (LINZESS ) 145 MCG CAPS capsule Take 1 capsule (145 mcg total) by mouth daily before breakfast. 30 capsule PRN   losartan  (COZAAR ) 25 MG tablet TAKE 1 TABLET (25 MG TOTAL) BY MOUTH DAILY. 90 tablet 3   lovastatin  (MEVACOR ) 40 MG tablet TAKE 1 TABLET BY MOUTH EVERY DAY IN THE EVENING 90 tablet 3   metFORMIN  (GLUCOPHAGE ) 1000 MG tablet Take 1 tablet (1,000 mg total) by mouth 2 (two) times daily with a meal. (Patient taking differently: Take 500 mg by mouth 2 (two) times daily with a meal.) 180 tablet 3   metoprolol  succinate (TOPROL -XL) 50 MG 24 hr tablet Take 1 tablet (50 mg total) by mouth at bedtime. Hold if systolic BP  less than 100 or HR less than 50 bpm 90 tablet 3   polyethylene glycol (MIRALAX  / GLYCOLAX ) 17 g packet Take 17 g by mouth daily as needed for moderate constipation.     psyllium (METAMUCIL) 58.6 % packet Take 1 packet by mouth daily as needed (Constipation).     rOPINIRole  (REQUIP ) 0.5 MG tablet Take 1 tablet (0.5 mg total) by mouth at bedtime. 90 tablet 3    Semaglutide ,0.25 or 0.5MG /DOS, 2 MG/3ML SOPN Inject 0.5 mg into the skin once a week. 3 mL    triamcinolone  cream (KENALOG ) 0.1 % Apply 1 Application topically 2 (two) times daily. X10 days as needed for rash 30 g 0   Current Facility-Administered Medications  Medication Dose Route Frequency Provider Last Rate Last Admin   cyanocobalamin  ((VITAMIN B-12)) injection 1,000 mcg  1,000 mcg Intramuscular Q30 days Harl Jayson CROME, MD   1,000 mcg at 11/02/23 1106    Allergies  Allergen Reactions   Jardiance  [Empagliflozin ] Other (See Comments)    RECURRENT VAGINITIS   Quinapril Hcl Cough   Statins Other (See Comments)    Not all Statins but some cause cough and pain in legs.   Tape Other (See Comments)    Redness, please use paper tape     REVIEW OF SYSTEMS:  [X]  denotes positive finding, [ ]  denotes negative finding Cardiac  Comments:  Chest pain or chest pressure:    Shortness of breath upon exertion:    Short of breath when lying flat:    Irregular heart rhythm:        Vascular    Pain in calf, thigh, or hip brought on by ambulation:    Pain in feet at night that wakes you up from your sleep:     Blood clot in your veins:    Leg swelling:         Pulmonary    Oxygen at home:    Productive cough:     Wheezing:         Neurologic    Sudden weakness in arms or legs:     Sudden numbness in arms or legs:     Sudden onset of difficulty speaking or slurred speech:    Temporary loss  of vision in one eye:     Problems with dizziness:         Gastrointestinal    Blood in stool:     Vomited blood:         Genitourinary    Burning when urinating:     Blood in urine:        Psychiatric    Major depression:         Hematologic    Bleeding problems:    Problems with blood clotting too easily:        Skin    Rashes or ulcers:        Constitutional    Fever or chills:      PHYSICAL EXAMINATION:  Vitals:   12/06/23 1332 12/06/23 1336  BP: (!) 156/67 136/67   Pulse: (!) 50   Temp: 97.9 F (36.6 C)   TempSrc: Temporal   SpO2: 96%   Weight: 214 lb 14.4 oz (97.5 kg)   Height: 5' 3 (1.6 m)     General:  WDWN in NAD; vital signs documented above Gait: Normal HENT: WNL, normocephalic Pulmonary: normal non-labored breathing Cardiac: regular HR Abdomen: soft Vascular Exam/Pulses: 2+ radial, 2+ DP pulses bilaterally Extremities: without ischemic changes, without Gangrene , without cellulitis; without open wounds; bilateral lower extremities edematous Musculoskeletal: no muscle wasting or atrophy  Neurologic: A&O X 3 Psychiatric:  The pt has Normal affect.   Non-Invasive Vascular Imaging:   VAS US  Carotid duplex: Summary:  Right Carotid: The ECA appears >50% stenosed. Right ICA stent velocity suggests 50 - 75% stenosis.   Left Carotid: Velocities in the left ICA are consistent with a 1-39% stenosis. The ECA appears >50% stenosed.   Vertebrals:  Right vertebral artery demonstrates antegrade flow. Left vertebral artery demonstrates bidirectional flow.  Subclavians: Normal flow hemodynamics were seen in bilateral subclavian arteries.    ASSESSMENT/PLAN:: 73 y.o. female here for follow up for carotid artery stenosis. She is without any associated symptoms.  - Duplex today continued to show some increased velocities in the right ICA stent consistent with 50-79% stenosis. The velocities are slightly higher then on prior duplex. Left ICA appears patent with 1-39% stenosis. Right vertebral with antegrade flow, left bidirectional. Normal flow in the subclavian arteries - continue Aspirin , Plavix , Statin - reviewed signs and symptoms of TIA/ Stroke and she understands should this occur to seek immediate medical attention -If PSV/ EDV increase on follow up duplex in 6 months we will obtain a CTA head and neck to further evaluate her right carotid stent - She will follow up in 6 months with carotid duplex   Teretha Damme, PA-C [facility] [phone]  Clinic MD:   Serene

## 2023-12-06 NOTE — Telephone Encounter (Unsigned)
 Copied from CRM [id number]. Topic: Clinical - Medication Question >> Dec 06, 2023  9:04 AM Laurier C wrote: Reason for CRM: Patient would like a call back at [phone]. Patient states she normally picks up Ozempic  at the office and she wants to know when more would be available.

## 2023-12-07 ENCOUNTER — Ambulatory Visit

## 2023-12-07 ENCOUNTER — Ambulatory Visit (HOSPITAL_COMMUNITY)
Admission: RE | Admit: 2023-12-07 | Discharge: 2023-12-07 | Disposition: A | Discharge status: Home / Self Care | Source: Ambulatory Visit | Attending: Family Medicine | Admitting: Family Medicine

## 2023-12-07 DIAGNOSIS — I350 Nonrheumatic aortic (valve) stenosis: Secondary | ICD-10-CM | POA: Insufficient documentation

## 2023-12-07 LAB — ECHOCARDIOGRAM COMPLETE
AR max vel: 0.73 cm2
AV Area VTI: 0.74 cm2
AV Area mean vel: 0.75 cm2
AV Mean grad: 36 mmHg
AV Peak grad: 57.9 mmHg
Ao pk vel: 3.81 m/s
Area-P 1/2: 2.91 cm2
S' Lateral: 3.3 cm

## 2023-12-07 NOTE — Progress Notes (Signed)
*  PRELIMINARY RESULTS* Echocardiogram 2D Echocardiogram has been performed.  Teresa Aida PARAS 12/07/2023, 11:40 AM

## 2023-12-08 ENCOUNTER — Other Ambulatory Visit (HOSPITAL_COMMUNITY): Payer: Self-pay

## 2023-12-08 ENCOUNTER — Ambulatory Visit (INDEPENDENT_AMBULATORY_CARE_PROVIDER_SITE_OTHER)

## 2023-12-08 ENCOUNTER — Encounter (HOSPITAL_COMMUNITY): Payer: Self-pay | Admitting: Hematology

## 2023-12-08 ENCOUNTER — Other Ambulatory Visit: Payer: Self-pay

## 2023-12-08 ENCOUNTER — Telehealth: Payer: Self-pay

## 2023-12-08 DIAGNOSIS — I6523 Occlusion and stenosis of bilateral carotid arteries: Secondary | ICD-10-CM

## 2023-12-08 DIAGNOSIS — E538 Deficiency of other specified B group vitamins: Secondary | ICD-10-CM | POA: Diagnosis not present

## 2023-12-08 NOTE — Progress Notes (Signed)
 Patient is in office today for a nurse visit for B12 Injection. Patient Injection was given in the  Left deltoid. Patient tolerated injection well.

## 2023-12-08 NOTE — Progress Notes (Unsigned)
 Pharmacy Medication Assistance Program Note    12/17/2023  Patient ID: Emily [name], female   DOB: 1951/04/26, 73 y.o.   MRN: [id number]     12/08/2023  Outreach Medication One  Manufacturer Medication One Novo Nordisk  Nordisk Drugs Ozempic   Type of Radiographer, therapeutic Assistance  Date Application Sent to Prescriber 12/08/2023  Date Application Received From Provider 12/13/2023  Date Application Submitted to Manufacturer 12/14/2023  Method Application Sent to Manufacturer Fax  Patient Assistance Determination Approved  Approval Start Date 12/16/2023  Approval End Date 05/31/2024     RENEWAL APPROVED

## 2023-12-11 ENCOUNTER — Ambulatory Visit: Payer: Self-pay | Admitting: Cardiology

## 2023-12-13 ENCOUNTER — Other Ambulatory Visit: Payer: Self-pay | Admitting: Family Medicine

## 2023-12-13 DIAGNOSIS — E1169 Type 2 diabetes mellitus with other specified complication: Secondary | ICD-10-CM

## 2023-12-13 NOTE — Telephone Encounter (Signed)
  Picked up at the [facility] office

## 2023-12-13 NOTE — Telephone Encounter (Signed)
 Copied from CRM [id number]. Topic: Clinical - Medication Refill >> Dec 13, 2023  9:13 AM Carrielelia G wrote: Medication: Semaglutide , 0.5MG /DOS, 2 MG/3ML SOPN  Has the patient contacted their pharmacy? No (Agent: If no, request that the patient contact the pharmacy for the refill. If patient does not wish to contact the pharmacy document the reason why and proceed with request.) (Agent: If yes, when and what did the pharmacy advise?)  Picked up at the [facility] office    Is the patient out of the medication? No  (4 doses left)   Has the patient been seen for an appointment in the last year OR does the patient have an upcoming appointment? Yes  Can we respond through MyChart? No  Agent: Please be advised that Rx refills may take up to 3 business days. We ask that you follow-up with your pharmacy.

## 2023-12-15 DIAGNOSIS — D3132 Benign neoplasm of left choroid: Secondary | ICD-10-CM | POA: Diagnosis not present

## 2023-12-15 DIAGNOSIS — E119 Type 2 diabetes mellitus without complications: Secondary | ICD-10-CM | POA: Diagnosis not present

## 2023-12-15 DIAGNOSIS — Z961 Presence of intraocular lens: Secondary | ICD-10-CM | POA: Diagnosis not present

## 2024-01-03 ENCOUNTER — Other Ambulatory Visit: Payer: Self-pay | Admitting: Family Medicine

## 2024-01-03 ENCOUNTER — Other Ambulatory Visit: Payer: Self-pay | Admitting: Cardiology

## 2024-01-03 DIAGNOSIS — M5416 Radiculopathy, lumbar region: Secondary | ICD-10-CM

## 2024-01-03 DIAGNOSIS — E034 Atrophy of thyroid (acquired): Secondary | ICD-10-CM

## 2024-01-03 DIAGNOSIS — E1169 Type 2 diabetes mellitus with other specified complication: Secondary | ICD-10-CM

## 2024-01-11 ENCOUNTER — Ambulatory Visit (INDEPENDENT_AMBULATORY_CARE_PROVIDER_SITE_OTHER): Admitting: *Deleted

## 2024-01-11 DIAGNOSIS — E538 Deficiency of other specified B group vitamins: Secondary | ICD-10-CM

## 2024-01-11 NOTE — Progress Notes (Signed)
 Patient is in office today for a nurse visit for B12 Injection. Patient Injection was given in the  Right deltoid. Patient tolerated injection well.

## 2024-01-12 ENCOUNTER — Telehealth: Payer: Self-pay | Admitting: Cardiology

## 2024-01-12 NOTE — Telephone Encounter (Signed)
 STAT if HR is under 50 or over 120 (normal HR is 60-100 beats per minute)  What is your heart rate?   HR 102, oxygen - 92  Do you have a log of your heart rate readings (document readings)?  No  Do you have any other symptoms?  No  Patient stated her HR has been fluctuating between 130 and 90.  Patient stated her HR has decreased this morning after morning medication.

## 2024-01-12 NOTE — Telephone Encounter (Signed)
 Spoke to patient she stated she did not feel good this morning,no energy.Pulse irregular ranging 90 to 130.She did not check B/P.Stated she feels better now.She is under a lot of stress.Appointment scheduled with Dr.Hochrein 8/22 at 3:20 pm.Advised to check B/P and pulse daily and bring readings to appointment.I will make Dr.Hochrein aware.

## 2024-01-13 ENCOUNTER — Telehealth: Payer: Self-pay | Admitting: Pharmacist

## 2024-01-13 NOTE — Telephone Encounter (Signed)
 Left message for pt to return call. Unable to leave details on vmail.

## 2024-01-13 NOTE — Telephone Encounter (Signed)
 Please let patient know her Ozempic  0.5mg  weekly is at the office for pick up #4 boxes of Ozempic  0.5mg 

## 2024-01-14 ENCOUNTER — Telehealth: Payer: Self-pay | Admitting: Physician Assistant

## 2024-01-14 ENCOUNTER — Other Ambulatory Visit: Payer: Self-pay | Admitting: Physician Assistant

## 2024-01-14 MED ORDER — DILTIAZEM HCL 30 MG PO TABS: 30.0000 mg | ORAL_TABLET | Freq: Three times a day (TID) | ORAL | 0 refills | Status: DC | PRN

## 2024-01-14 NOTE — Telephone Encounter (Signed)
 Patient aware, verbalized understanding.

## 2024-01-14 NOTE — Telephone Encounter (Signed)
 Pt r/c.

## 2024-01-14 NOTE — Telephone Encounter (Signed)
 Patient who has rare history of A-fib has recurrent A-fib around 6:30 PM today.  She has very good cardiac awareness of A-fib and knows the exact moment when she goes into A-fib and it feels the exact moment when she come out of A-fib.  Previously, she has rare episode of A-fib and take as needed dose of diltiazem .  She had a history of Watchman procedure and could not tolerate anticoagulation due to history of GI bleed.  I have sent in a new prescription of short acting diltiazem  to her pharmacy.  She has been instructed to contact us  if her heart rate is persistently over 120 bpm despite the rate control therapy.

## 2024-01-17 ENCOUNTER — Other Ambulatory Visit: Payer: Self-pay | Admitting: Physician Assistant

## 2024-01-20 DIAGNOSIS — R001 Bradycardia, unspecified: Secondary | ICD-10-CM | POA: Insufficient documentation

## 2024-01-20 NOTE — Telephone Encounter (Signed)
 Pt of Dr. Lavona. This RX was filled on 8/15. Please advise on another refill.

## 2024-01-20 NOTE — Progress Notes (Unsigned)
 Cardiology Office Note:   Date:  01/21/2024  ID:  Emily [name], DOB [birth year], MRN [id number] PCP: Jolinda Norene HERO, DO  [facility] Providers Cardiologist:  Lynwood Schilling, MD Electrophysiologist:  Will Gladis Norton, MD {  History of Present Illness:   Emily [name] is a 73 y.o. female who presents for the patient has paroxysmal atrial fibrillation and was treated with flecainide . She has had recurrent GI bleeding and could not tolerate anticoagulation. She has had transfusions. She subsequently had double-balloon enteroscopy. 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 She did have Watchman.   She also had a follow up echo on 02/22/23 and had a normal EF with moderate aortic stenosis and a gradient of 24.2 mean.   She had carotid stenting.    She had an echocardiogram recently to evaluate aortic stenosis.  It is suggested that the aortic stenosis gradient was a mean of 36 with a VTI of 0.74.  There was also suggestion of severe septal hypertrophy.  She has some mild shortness of breath with activity but she does not think this is more severe than previous.  She is not having any PND or orthopnea.  She did call recently because she was having increased paroxysms of atrial fibrillation.  However, she had been drinking a fair amount of caffeine.  She eventually has had resolution of this and it does not bother her since she made her phone call a few days ago.  She is not having any presyncope or syncope.  She is not having any chest pressure, neck or arm discomfort.  She has had no weight gain.  ROS: As stated in the HPI and negative for all other systems.  Studies Reviewed:    EKG:   EKG Interpretation Date/Time:  Friday January 21 2024 15:10:40 EDT Ventricular Rate:  58 PR Interval:  198 QRS Duration:  174 QT Interval:  516 QTC Calculation: 506 R Axis:   -67  Text Interpretation: Sinus bradycardia Right bundle branch block Left anterior  fascicular block Bifascicular block Minimal voltage criteria for LVH, may be normal variant ( R in aVL ) When compared with ECG of 03-Jun-2023 11:29, No significant change was found Confirmed by Schilling Lynwood ([id number]) on 01/21/2024 3:54:53 PM  Review sinus rhythm deep T wave inversions with unstable angina there is a hold there is a home health you can print a hold of  Risk Assessment/Calculations:     Physical Exam:   VS:  BP (!) 157/64   Pulse (!) 58   Ht 5' 3 (1.6 m)   Wt 203 lb (92.1 kg)   SpO2 96%   BMI 35.96 kg/m    Wt Readings from Last 3 Encounters:  01/21/24 203 lb (92.1 kg)  12/06/23 214 lb 14.4 oz (97.5 kg)  11/09/23 208 lb (94.3 kg)     GEN: Well nourished, well developed in no acute distress NECK: No JVD; No carotid bruits CARDIAC: RRR, 3 out of 6 apical systolic murmur radiating out the aortic outflow tract and increasing slightly with the strain phase of Valsalva, no diastolic murmurs, rubs, gallops RESPIRATORY:  Clear to auscultation without rales, wheezing or rhonchi  ABDOMEN: Soft, non-tender, non-distended EXTREMITIES:  Left greater than right leg edema; No deformity   ASSESSMENT AND PLAN:   Carotid stenosis: She status post carotid stenting.  No change in therapy.    Bradycardia: She has had bradycardia before.  She has no symptoms related to this.  No change  in therapy at this time.  We have to use beta-blockers with caution if she needs rate control in the future.    AS:  This was severe on echo in 2024.  There is also a suggestion of septal hypertrophy.  I am going to set her up for an MRI to further evaluate.  She does have a dynamic component to her murmur.   PAF:   She seems to be doing well on flecainide .   She has a Youth worker.  She is going to avoid caffeine.  She thinks it has settled down and is not problematic at this point.  No change in therapy otherwise.   Leg swelling: This is chronic and unchanged.  No change in therapy.   Hypertension: Her  blood pressure is mildly elevated.    Follow up with me in six months.   Signed, Lynwood Schilling, MD

## 2024-01-21 ENCOUNTER — Ambulatory Visit: Attending: Cardiology | Admitting: Cardiology

## 2024-01-21 ENCOUNTER — Encounter: Payer: Self-pay | Admitting: Cardiology

## 2024-01-21 VITALS — BP 157/64 | HR 58 | Ht 63.0 in | Wt 203.0 lb

## 2024-01-21 DIAGNOSIS — I350 Nonrheumatic aortic (valve) stenosis: Secondary | ICD-10-CM | POA: Diagnosis not present

## 2024-01-21 DIAGNOSIS — I10 Essential (primary) hypertension: Secondary | ICD-10-CM | POA: Diagnosis not present

## 2024-01-21 DIAGNOSIS — M7989 Other specified soft tissue disorders: Secondary | ICD-10-CM

## 2024-01-21 DIAGNOSIS — R001 Bradycardia, unspecified: Secondary | ICD-10-CM

## 2024-01-21 DIAGNOSIS — I480 Paroxysmal atrial fibrillation: Secondary | ICD-10-CM | POA: Diagnosis not present

## 2024-01-21 NOTE — Patient Instructions (Signed)
 Medication Instructions:  Continue all medications *If you need a refill on your cardiac medications before your next appointment, please call your pharmacy*  Lab Work: Hemoglobin and hematocrit today  Testing/Procedures: Cardiac MRI will be scheduled at [facility] after approved by insurance  Follow-Up: At [facility], you and your health needs are our priority.  As part of our continuing mission to provide you with exceptional heart care, our providers are all part of one team.  This team includes your primary Cardiologist (physician) and Advanced Practice Providers or APPs (Physician Assistants and Nurse Practitioners) who all work together to provide you with the care you need, when you need it.  Your next appointment:  6 months  in [place]    Call in Oct to schedule Feb appointment     Provider:  Dr.Hochrein    We recommend signing up for the patient portal called MyChart.  Sign up information is provided on this After Visit Summary.  MyChart is used to connect with patients for Virtual Visits (Telemedicine).  Patients are able to view lab/test results, encounter notes, upcoming appointments, etc.  Non-urgent messages can be sent to your provider as well.   To learn more about what you can do with MyChart, go to [email or web address]

## 2024-01-22 LAB — HEMOGLOBIN AND HEMATOCRIT, BLOOD
Hematocrit: 35.7 % (ref 34.0–46.6)
Hemoglobin: 11.5 g/dL (ref 11.1–15.9)

## 2024-01-23 ENCOUNTER — Ambulatory Visit: Payer: Self-pay | Admitting: Cardiology

## 2024-02-03 ENCOUNTER — Telehealth: Payer: Self-pay | Admitting: Cardiology

## 2024-02-03 NOTE — Telephone Encounter (Signed)
 STAT if HR is under 50 or over 120 (normal HR is 60-100 beats per minute)  What is your heart rate? 56,42,53  Do you have a log of your heart rate readings (document readings)?   Do you have any other symptoms? no

## 2024-02-03 NOTE — Telephone Encounter (Signed)
 Patient verbalized heart rate ranged from mid 40's  today couple times. Per patient heart rate normally ranges late 40 to mid 58. Patient denies dizziness, lightheadedness and verbalizes she is fine and feels great. Offered and encouraged patient to scheduled appt.  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 Patient verbalized she would like to make provider aware. Advice patient that this will be sent to provider. Patient verbalized if she needs an appointment she will call office back to schedule an appointment. Advised patient if heart continue to decrease and symptoms should start, please go to Emergency Room. Understanding verbalized.

## 2024-02-04 NOTE — Telephone Encounter (Signed)
 Spoke with pt. She is not having any symptoms. Pt aware of Dr. Denver suggestion to not make any changes. Pt told to call if she begins to have symptoms. Pt verbalized understanding. All questions if any were answered.

## 2024-02-08 DIAGNOSIS — Z961 Presence of intraocular lens: Secondary | ICD-10-CM | POA: Diagnosis not present

## 2024-02-08 DIAGNOSIS — H16143 Punctate keratitis, bilateral: Secondary | ICD-10-CM | POA: Diagnosis not present

## 2024-02-08 DIAGNOSIS — E119 Type 2 diabetes mellitus without complications: Secondary | ICD-10-CM | POA: Diagnosis not present

## 2024-02-09 ENCOUNTER — Ambulatory Visit: Admitting: Cardiology

## 2024-02-15 ENCOUNTER — Ambulatory Visit

## 2024-02-17 ENCOUNTER — Ambulatory Visit

## 2024-02-17 DIAGNOSIS — E538 Deficiency of other specified B group vitamins: Secondary | ICD-10-CM | POA: Diagnosis not present

## 2024-02-17 NOTE — Progress Notes (Signed)
 Patient presented to clinic today for a B-12 injection. Tolerated well

## 2024-02-21 ENCOUNTER — Telehealth: Payer: Self-pay | Admitting: Cardiology

## 2024-02-21 NOTE — Telephone Encounter (Signed)
 Spoke with pt concerning her heart rate. Pt stated her heart rate has been elevated with today's readings being 111, 108, 115 and 93. Other times her heart rate is in the 60s. Pt denies having any caffeine today but did have chocolate and a mountain dew yesterday. Pt stated last night her heart rate got up to 120 and this caused her to have back pain between her shoulder blades and she could not sleep. Pt stated she took an extra 30 mg of diltiazem  last night as prescribed for afib. Pt denied any chest pain or back pain at the time of this call. Pt also denied any shortness of breath, lightheadedness or dizziness. Pt was asked if she is staying adequately hydrated. Pt stated she does not drink water. Pt was advised to drink fluids and that Dr. Lavona would be notified of her heart rates. Pt was given ED precautions. Pt verbalized understanding. All questions if any were answered.

## 2024-02-21 NOTE — Telephone Encounter (Signed)
 STAT if HR is under 50 or over 120 (normal HR is 60-100 beats per minute)  What is your heart rate? 113  Do you have a log of your heart rate readings (document readings)? 111, 108, 115, 93  Do you have any other symptoms? Patient states last night her back hurt. She is tired because she only slept 2 hours last night.

## 2024-02-23 ENCOUNTER — Other Ambulatory Visit: Payer: Self-pay | Admitting: Family Medicine

## 2024-02-23 ENCOUNTER — Other Ambulatory Visit: Payer: Self-pay | Admitting: Cardiology

## 2024-02-23 NOTE — Telephone Encounter (Signed)
 Emily Agent, MD to [facility] Triage (Selected Message)     02/22/24  8:17 PM I would like to continue the current meds since she has had problems with bradycardia.  S/w pt, went over the information above. She states that her hr is normal now and she is feeling much better. Aware that she can take diltazem 30 mg up to 3 times a day as needed for irregular heart rate. She verbalized understanding.

## 2024-02-24 ENCOUNTER — Encounter: Payer: Self-pay | Admitting: Family Medicine

## 2024-02-24 ENCOUNTER — Ambulatory Visit (INDEPENDENT_AMBULATORY_CARE_PROVIDER_SITE_OTHER): Admitting: Family Medicine

## 2024-02-24 VITALS — BP 157/54 | HR 56 | Temp 97.5°F | Ht 63.0 in | Wt 207.4 lb

## 2024-02-24 DIAGNOSIS — H00015 Hordeolum externum left lower eyelid: Secondary | ICD-10-CM

## 2024-02-24 DIAGNOSIS — L03213 Periorbital cellulitis: Secondary | ICD-10-CM

## 2024-02-24 MED ORDER — AMOXICILLIN-POT CLAVULANATE 875-125 MG PO TABS: 1.0000 | ORAL_TABLET | Freq: Two times a day (BID) | ORAL | 0 refills | Status: DC

## 2024-02-24 NOTE — Progress Notes (Signed)
 Subjective:  Patient ID: Emily [name], female    DOB: 1950/07/24, 73 y.o.   MRN: [id number]  Patient Care Team: Jolinda Norene HERO, DO as PCP - General (Family Medicine) Lavona Agent, MD as PCP - Cardiology (Cardiology) Inocencio Soyla Lunger, MD as PCP - Electrophysiology (Cardiology) Lavona Agent, MD as Consulting Physician (Cardiology) Matilda Senior, MD as Consulting Physician (Urology) Candi Lonni BIRCH, MD (Gastroenterology) Clayton Quale, MD as Referring Physician (Obstetrics and Gynecology) Dolphus Reiter, MD as Consulting Physician (Rheumatology) Serene Gaile ORN, MD as Consulting Physician (Vascular Surgery) Billee Mliss BIRCH, [facility] (Pharmacist) Clayton Quale, MD as Referring Physician (Obstetrics and Gynecology)   Chief Complaint:  Facial Swelling (LEFT EYE + UNDER EYE)   HPI: Emily [name] is a 73 y.o. female presenting on 02/24/2024 for Facial Swelling (LEFT EYE + UNDER EYE)   Emily [name] is a 73 year old female who presents with an infected sty on the left lower eyelid.  The sty appeared yesterday after lunch with initial soreness and redness in the left lower eyelid. By the end of the day, swelling developed. She has been using a stye ointment without relief of symptoms.  She has a history of styes but has never experienced one with this degree of swelling. She has been applying warm compresses since yesterday, doing so in the morning, last night, and again this morning.  No fever or changes in vision. She is able to move her eye without discomfort.  She is not currently taking any antibiotics and has no known allergies to them.          Relevant past medical, surgical, family, and social history reviewed and updated as indicated.  Allergies and medications reviewed and updated. Data reviewed: Chart in Epic.   Past Medical History:  Diagnosis Date   Anemia    Arthritis    Atrial fibrillation, rapid (HCC) 10/27/2013    Bilateral carotid artery disease    Complication of anesthesia    difficult intubation in past   Diabetes mellitus without complication (HCC)    type 2   Difficult intubation    states 'lady that did the sleep study told me I have the smallest airway she has ever seen in an adult   Dyslipidemia 06/29/2017   Heart murmur    History of blood transfusion 09/2019   GI bleed - in [place]   Hypertension    Hypothyroid    Obesity    Obstructive sleep apnea    occasional uses CPAP   Peripheral vascular disease    Persistent atrial fibrillation (HCC) 01/16/2020   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 - xrays were negative   Upper GI bleed 09/25/2019    Past Surgical History:  Procedure Laterality Date   CATARACT EXTRACTION Left    CATARACT EXTRACTION W/PHACO Right 12/09/2015   Procedure: CATARACT EXTRACTION PHACO AND INTRAOCULAR LENS PLACEMENT (IOC);  Surgeon: Cherene Mania, MD;  Location: [facility];  Service: Ophthalmology;  Laterality: Right;  CDE:10.48   COLONOSCOPY W/ POLYPECTOMY     cyst on back of neck removed     DILATION AND CURETTAGE OF UTERUS     x 5   ENDARTERECTOMY Left 08/30/2015   Procedure: LEFT CAROYID ENDARTERECTOMY WITH XENOSURE BOVINE PERICARDIUM PATCH ANGIOPLASTY;  Surgeon: Gaile ORN Serene, MD;  Location: [facility] OR;  Service: Vascular;  Laterality: Left;   TONSILLECTOMY     TRANSCAROTID ARTERY REVASCULARIZATION  Right 03/19/2023   Procedure: Right Transcarotid Artery Revascularization;  Surgeon: Serene Gaile ORN, MD;  Location: [facility] OR;  Service: Vascular;  Laterality: Right;   ULTRASOUND GUIDANCE FOR VASCULAR ACCESS Left 03/19/2023   Procedure: ULTRASOUND GUIDANCE FOR VASCULAR ACCESS;  Surgeon: Serene Gaile ORN, MD;  Location: [facility] OR;  Service: Vascular;  Laterality: Left;   UPPER GI ENDOSCOPY     x several    Social History   Socioeconomic History   Marital status: Single    Spouse name: Not on file   Number of children: Not on file   Years of  education: Not on file   Highest education level: Not on file  Occupational History   Not on file  Tobacco Use   Smoking status: Never    Passive exposure: Never   Smokeless tobacco: Never  Vaping Use   Vaping status: Never Used  Substance and Sexual Activity   Alcohol use: No    Alcohol/week: 0.0 standard drinks of alcohol   Drug use: Never   Sexual activity: Not Currently    Birth control/protection: Post-menopausal  Other Topics Concern   Not on file  Social History Narrative   Not on file   Social Drivers of Health   Financial Resource Strain: Low Risk  (04/21/2022)   Overall Financial Resource Strain (CARDIA)    Difficulty of Paying Living Expenses: Not hard at all  Food Insecurity: No Food Insecurity (03/22/2023)   Hunger Vital Sign    Worried About Running Out of Food in the Last Year: Never true    Ran Out of Food in the Last Year: Never true  Transportation Needs: No Transportation Needs (03/22/2023)   PRAPARE - Administrator, Civil Service (Medical): No    Lack of Transportation (Non-Medical): No  Physical Activity: Inactive (04/21/2022)   Exercise Vital Sign    Days of Exercise per Week: 0 days    Minutes of Exercise per Session: 0 min  Stress: No Stress Concern Present (04/21/2022)   [employer] of Occupational Health - Occupational Stress Questionnaire    Feeling of Stress : Not at all  Social Connections: Moderately Integrated (04/21/2022)   Social Connection and Isolation Panel    Frequency of Communication with Friends and Family: More than three times a week    Frequency of Social Gatherings with Friends and Family: Twice a week    Attends Religious Services: More than 4 times per year    Active Member of [employer] or Organizations: Yes    Attends Engineer, structural: More than 4 times per year    Marital Status: Never married  Intimate Partner Violence: Not At Risk (03/19/2023)   Humiliation, Afraid, Rape, and Kick  questionnaire    Fear of Current or Ex-Partner: No    Emotionally Abused: No    Physically Abused: No    Sexually Abused: No    Outpatient Encounter Medications as of 02/24/2024  Medication Sig   Accu-Chek Softclix Lancets lancets Test BS daily Dx E11.69   amoxicillin -clavulanate (AUGMENTIN ) 875-125 MG tablet Take 1 tablet by mouth 2 (two) times daily.   bisacodyl  (DULCOLAX) 5 MG EC tablet Take 5 mg by mouth daily as needed (constipation).   clopidogrel  (PLAVIX ) 75 MG tablet TAKE 1 TABLET BY MOUTH EVERY DAY   diltiazem  (CARDIZEM  CD) 180 MG 24 hr capsule TAKE 1 CAPSULE BY MOUTH EVERY DAY   diltiazem  (CARDIZEM ) 30 MG tablet Take 1 tablet (30 mg total) by mouth 3 (three) times daily as needed (afib).   diphenhydramine -acetaminophen  (TYLENOL  PM)  25-500 MG TABS tablet Take 1 tablet by mouth at bedtime as needed (Pain).   ezetimibe  (ZETIA ) 10 MG tablet TAKE 1 TABLET BY MOUTH EVERY DAY   flecainide  (TAMBOCOR ) 100 MG tablet TAKE 1 TABLET BY MOUTH TWICE A DAY   furosemide  (LASIX ) 20 MG tablet Take 1 tablet (20 mg total) by mouth daily as needed for fluid.   gabapentin  (NEURONTIN ) 300 MG capsule TAKE 1 CAPSULE (300 MG TOTAL) BY MOUTH 2 (TWO) TIMES DAILY AS NEEDED (NEUROPATHY RIGHT LEG).   glipiZIDE  (GLUCOTROL ) 5 MG tablet TAKE 1 TABLET (5 MG TOTAL) BY MOUTH DAILY BEFORE BREAKFAST. DOSE REDUCTION   glucose blood (ACCU-CHEK GUIDE TEST) test strip Test BS daily Dx E11.69   HYDROcodone -acetaminophen  (NORCO) 5-325 MG tablet Take 1 tablet by mouth every 6 (six) hours as needed for severe pain (pain score 7-10).   levonorgestrel (MIRENA) 20 MCG/DAY IUD 1 each by Intrauterine route once.   levothyroxine  (SYNTHROID ) 112 MCG tablet TAKE 1 TABLET BY MOUTH EVERY OTHER DAY, ALTERNATING WITH 1 & 1/2 TABLETS EVERY OTHER DAY   linaclotide  (LINZESS ) 145 MCG CAPS capsule Take 1 capsule (145 mcg total) by mouth daily before breakfast.   losartan  (COZAAR ) 25 MG tablet TAKE 1 TABLET (25 MG TOTAL) BY MOUTH DAILY.    lovastatin  (MEVACOR ) 40 MG tablet TAKE 1 TABLET BY MOUTH EVERY DAY IN THE EVENING   metFORMIN  (GLUCOPHAGE ) 1000 MG tablet Take 1 tablet (1,000 mg total) by mouth 2 (two) times daily with a meal. (Patient taking differently: Take 500 mg by mouth 2 (two) times daily with a meal.)   metoprolol  succinate (TOPROL -XL) 50 MG 24 hr tablet Take 1 tablet (50 mg total) by mouth at bedtime. Hold if systolic BP  less than 100 or HR less than 50 bpm   polyethylene glycol (MIRALAX  / GLYCOLAX ) 17 g packet Take 17 g by mouth daily as needed for moderate constipation.   psyllium (METAMUCIL) 58.6 % packet Take 1 packet by mouth daily as needed (Constipation).   rOPINIRole  (REQUIP ) 0.5 MG tablet Take 1 tablet (0.5 mg total) by mouth at bedtime.   Semaglutide ,0.25 or 0.5MG /DOS, 2 MG/3ML SOPN Inject 0.5 mg into the skin once a week.   triamcinolone  cream (KENALOG ) 0.1 % Apply 1 Application topically 2 (two) times daily. X10 days as needed for rash   Facility-Administered Encounter Medications as of 02/24/2024  Medication   cyanocobalamin  ((VITAMIN B-12)) injection 1,000 mcg    Allergies  Allergen Reactions   Jardiance  [Empagliflozin ] Other (See Comments)    RECURRENT VAGINITIS   Quinapril Hcl Cough   Statins Other (See Comments)    Not all Statins but some cause cough and pain in legs.   Tape Other (See Comments)    Redness, please use paper tape    Pertinent ROS per HPI, otherwise unremarkable      Objective:  BP (!) 157/54   Pulse (!) 56   Temp (!) 97.5 F (36.4 C)   Ht 5' 3 (1.6 m)   Wt 207 lb 6.4 oz (94.1 kg)   SpO2 96%   BMI 36.74 kg/m    Wt Readings from Last 3 Encounters:  02/24/24 207 lb 6.4 oz (94.1 kg)  01/21/24 203 lb (92.1 kg)  12/06/23 214 lb 14.4 oz (97.5 kg)    Physical Exam Vitals and nursing note reviewed.  Constitutional:      General: She is not in acute distress.    Appearance: Normal appearance. She is obese. She is not ill-appearing, toxic-appearing  or diaphoretic.   HENT:     Head: Normocephalic and atraumatic.     Mouth/Throat:     Mouth: Mucous membranes are moist.  Eyes:     General:        Left eye: Discharge present.    Extraocular Movements:     Left eye: Normal extraocular motion and no nystagmus.     Conjunctiva/sclera:     Left eye: Left conjunctiva is injected. Chemosis and exudate present. No hemorrhage.    Pupils: Pupils are equal, round, and reactive to light.     Comments: Image below. No eye pain with movement, no visual changes.   Cardiovascular:     Rate and Rhythm: Normal rate and regular rhythm.     Heart sounds: Normal heart sounds.  Pulmonary:     Effort: Pulmonary effort is normal.     Breath sounds: Normal breath sounds.  Skin:    General: Skin is warm and dry.     Capillary Refill: Capillary refill takes less than 2 seconds.  Neurological:     General: No focal deficit present.     Mental Status: She is alert and oriented to person, place, and time.  Psychiatric:        Mood and Affect: Mood normal.        Behavior: Behavior normal.        Thought Content: Thought content normal.        Judgment: Judgment normal.     HEENT: Left lower eyelid with purulent drainage and swelling. Preseptal cellulitis of the left eye.        Results for orders placed or performed in visit on 01/21/24  Hemoglobin and hematocrit, blood   Collection Time: 01/21/24  4:46 PM  Result Value Ref Range   Hemoglobin 11.5 11.1 - 15.9 g/dL   Hematocrit 64.2 65.9 - 46.6 %       Pertinent labs & imaging results that were available during my care of the patient were reviewed by me and considered in my medical decision making.  Assessment & Plan:  Emily [name] was seen today for facial swelling.  Diagnoses and all orders for this visit:  Preseptal cellulitis of left lower eyelid -     amoxicillin -clavulanate (AUGMENTIN ) 875-125 MG tablet; Take 1 tablet by mouth 2 (two) times daily.  Hordeolum externum left lower eyelid -      amoxicillin -clavulanate (AUGMENTIN ) 875-125 MG tablet; Take 1 tablet by mouth 2 (two) times daily.       Left lower eyelid preseptal cellulitis with abscess Acute preseptal cellulitis of the left lower eyelid with associated abscess formation. Onset was yesterday, beginning with soreness and redness, progressing to significant swelling and purulent drainage. No fever or vision changes reported. Differential diagnosis includes orbital cellulitis, but current presentation is consistent with preseptal cellulitis. Risk of progression to orbital cellulitis if untreated. - Prescribe Augmentin  to be taken twice daily for 10 days. Start with a dose now and another before bed. - Apply warm compresses several times a day using a new rag each time to prevent reinfection. - Return for re-evaluation if symptoms worsen, including increased swelling, pain with eye movement, visual changes, or fever. - Go to the emergency room if there are any changes in vision, pain with eye movement, or if symptoms worsen over the weekend.         Continue all other maintenance medications.  Follow up plan: Return if symptoms worsen or fail to improve.   Continue healthy lifestyle  choices, including diet (rich in fruits, vegetables, and lean proteins, and low in salt and simple carbohydrates) and exercise (at least 30 minutes of moderate physical activity daily).   The above assessment and management plan was discussed with the patient. The patient verbalized understanding of and has agreed to the management plan. Patient is aware to call the clinic if they develop any new symptoms or if symptoms persist or worsen. Patient is aware when to return to the clinic for a follow-up visit. Patient educated on when it is appropriate to go to the emergency department.   Emily Bruns, FNP-C [facility] [phone]

## 2024-03-07 ENCOUNTER — Ambulatory Visit (HOSPITAL_COMMUNITY)
Admission: RE | Admit: 2024-03-07 | Discharge: 2024-03-07 | Disposition: A | Discharge status: Home / Self Care | Source: Ambulatory Visit | Attending: Cardiology | Admitting: Cardiology

## 2024-03-07 ENCOUNTER — Other Ambulatory Visit: Payer: Self-pay | Admitting: Cardiology

## 2024-03-07 DIAGNOSIS — R001 Bradycardia, unspecified: Secondary | ICD-10-CM | POA: Diagnosis not present

## 2024-03-07 DIAGNOSIS — I350 Nonrheumatic aortic (valve) stenosis: Secondary | ICD-10-CM

## 2024-03-07 DIAGNOSIS — I10 Essential (primary) hypertension: Secondary | ICD-10-CM | POA: Insufficient documentation

## 2024-03-07 DIAGNOSIS — M7989 Other specified soft tissue disorders: Secondary | ICD-10-CM | POA: Diagnosis not present

## 2024-03-07 DIAGNOSIS — I480 Paroxysmal atrial fibrillation: Secondary | ICD-10-CM | POA: Insufficient documentation

## 2024-03-07 MED ORDER — GADOBUTROL 1 MMOL/ML IV SOLN
10.0000 mL | Freq: Once | INTRAVENOUS | Status: AC | PRN
Start: 2024-03-07 — End: 2024-03-07
  Administered 2024-03-07: 10 mL via INTRAVENOUS

## 2024-03-14 ENCOUNTER — Encounter: Payer: Self-pay | Admitting: Family Medicine

## 2024-03-14 ENCOUNTER — Telehealth: Payer: Self-pay | Admitting: Cardiology

## 2024-03-14 ENCOUNTER — Ambulatory Visit (INDEPENDENT_AMBULATORY_CARE_PROVIDER_SITE_OTHER): Admitting: Family Medicine

## 2024-03-14 ENCOUNTER — Telehealth: Payer: Self-pay | Admitting: Family Medicine

## 2024-03-14 VITALS — BP 112/53 | HR 52 | Temp 96.8°F | Ht 63.0 in | Wt 203.8 lb

## 2024-03-14 DIAGNOSIS — K581 Irritable bowel syndrome with constipation: Secondary | ICD-10-CM

## 2024-03-14 DIAGNOSIS — Z780 Asymptomatic menopausal state: Secondary | ICD-10-CM

## 2024-03-14 DIAGNOSIS — E034 Atrophy of thyroid (acquired): Secondary | ICD-10-CM

## 2024-03-14 DIAGNOSIS — E1159 Type 2 diabetes mellitus with other circulatory complications: Secondary | ICD-10-CM | POA: Diagnosis not present

## 2024-03-14 DIAGNOSIS — E1169 Type 2 diabetes mellitus with other specified complication: Secondary | ICD-10-CM | POA: Diagnosis not present

## 2024-03-14 DIAGNOSIS — E119 Type 2 diabetes mellitus without complications: Secondary | ICD-10-CM | POA: Diagnosis not present

## 2024-03-14 DIAGNOSIS — E785 Hyperlipidemia, unspecified: Secondary | ICD-10-CM | POA: Diagnosis not present

## 2024-03-14 DIAGNOSIS — Z7985 Long-term (current) use of injectable non-insulin antidiabetic drugs: Secondary | ICD-10-CM

## 2024-03-14 DIAGNOSIS — I152 Hypertension secondary to endocrine disorders: Secondary | ICD-10-CM | POA: Diagnosis not present

## 2024-03-14 DIAGNOSIS — Z13820 Encounter for screening for osteoporosis: Secondary | ICD-10-CM

## 2024-03-14 LAB — BAYER DCA HB A1C WAIVED: HB A1C (BAYER DCA - WAIVED): 5.4 % (ref 4.8–5.6)

## 2024-03-14 MED ORDER — LINACLOTIDE 290 MCG PO CAPS: 290.0000 ug | ORAL_CAPSULE | Freq: Every day | ORAL | Status: AC

## 2024-03-14 NOTE — Telephone Encounter (Signed)
 OK for pt to keep apt for CPE with Dr Jolinda in [facility]. The apt in May has been cancelled.

## 2024-03-14 NOTE — Progress Notes (Signed)
 Subjective: CC:DM PCP: Jolinda Norene HERO, DO Emily [name] is a 73 y.o. female presenting to clinic today for:  Type 2 Diabetes with hypertension, hyperlipidemia:  Compliant with Ozempic , glipizide , metformin , Mevacor , Zetia  and cardiac/blood pressure medications.  She denies any hypoglycemic episodes.  The highest blood sugars she sees in the 200s and this is typically after she has eaten something that is heavy.  She really has made the effort to watch her diet however  ROS: No chest pain, shortness of breath or falls.  She does report constipation that has been more prominent over the last week or so.  She denies any gross rectal bleeding but may have seen some scant bleeding due to an internal hemorrhoid when she had diarrhea a couple weeks ago.  She had a cardiac MRI done but has not yet heard about the results yet   Diabetes Health Maintenance Due  Topic Date Due   OPHTHALMOLOGY EXAM  09/29/2023   FOOT EXAM  03/01/2024   HEMOGLOBIN A1C  05/10/2024    ROS: Per HPI  Allergies  Allergen Reactions   Jardiance  [Empagliflozin ] Other (See Comments)    RECURRENT VAGINITIS   Quinapril Hcl Cough   Statins Other (See Comments)    Not all Statins but some cause cough and pain in legs.   Tape Other (See Comments)    Redness, please use paper tape   Past Medical History:  Diagnosis Date   Anemia    Arthritis    Atrial fibrillation, rapid (HCC) 10/27/2013   Bilateral carotid artery disease    Complication of anesthesia    difficult intubation in past   Diabetes mellitus without complication (HCC)    type 2   Difficult intubation    states 'lady that did the sleep study told me I have the smallest airway she has ever seen in an adult   Dyslipidemia 06/29/2017   Heart murmur    History of blood transfusion 09/2019   GI bleed - in [place]   Hypertension    Hypothyroid    Obesity    Obstructive sleep apnea    occasional uses CPAP   Peripheral vascular disease     Persistent atrial fibrillation (HCC) 01/16/2020   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 - xrays were negative   Upper GI bleed 09/25/2019    Current Outpatient Medications:    Accu-Chek Softclix Lancets lancets, Test BS daily Dx E11.69, Disp: 100 each, Rfl: 3   amoxicillin -clavulanate (AUGMENTIN ) 875-125 MG tablet, Take 1 tablet by mouth 2 (two) times daily., Disp: 20 tablet, Rfl: 0   bisacodyl  (DULCOLAX) 5 MG EC tablet, Take 5 mg by mouth daily as needed (constipation)., Disp: , Rfl:    clopidogrel  (PLAVIX ) 75 MG tablet, TAKE 1 TABLET BY MOUTH EVERY DAY, Disp: 90 tablet, Rfl: 2   diltiazem  (CARDIZEM  CD) 180 MG 24 hr capsule, TAKE 1 CAPSULE BY MOUTH EVERY DAY, Disp: 90 capsule, Rfl: 3   diltiazem  (CARDIZEM ) 30 MG tablet, Take 1 tablet (30 mg total) by mouth 3 (three) times daily as needed (afib)., Disp: 30 tablet, Rfl: 0   diphenhydramine -acetaminophen  (TYLENOL  PM) 25-500 MG TABS tablet, Take 1 tablet by mouth at bedtime as needed (Pain)., Disp: , Rfl:    ezetimibe  (ZETIA ) 10 MG tablet, TAKE 1 TABLET BY MOUTH EVERY DAY, Disp: 90 tablet, Rfl: 2   flecainide  (TAMBOCOR ) 100 MG tablet, TAKE 1 TABLET BY MOUTH TWICE A DAY, Disp: 180 tablet, Rfl: 3  furosemide  (LASIX ) 20 MG tablet, Take 1 tablet (20 mg total) by mouth daily as needed for fluid., Disp: 90 tablet, Rfl: 3   gabapentin  (NEURONTIN ) 300 MG capsule, TAKE 1 CAPSULE (300 MG TOTAL) BY MOUTH 2 (TWO) TIMES DAILY AS NEEDED (NEUROPATHY RIGHT LEG)., Disp: 180 capsule, Rfl: 0   glipiZIDE  (GLUCOTROL ) 5 MG tablet, TAKE 1 TABLET (5 MG TOTAL) BY MOUTH DAILY BEFORE BREAKFAST. DOSE REDUCTION, Disp: 90 tablet, Rfl: 0   glucose blood (ACCU-CHEK GUIDE TEST) test strip, Test BS daily Dx E11.69, Disp: 100 strip, Rfl: 3   HYDROcodone -acetaminophen  (NORCO) 5-325 MG tablet, Take 1 tablet by mouth every 6 (six) hours as needed for severe pain (pain score 7-10)., Disp: 10 tablet, Rfl: 0   levonorgestrel (MIRENA) 20 MCG/DAY IUD, 1 each by  Intrauterine route once., Disp: , Rfl:    levothyroxine  (SYNTHROID ) 112 MCG tablet, TAKE 1 TABLET BY MOUTH EVERY OTHER DAY, ALTERNATING WITH 1 & 1/2 TABLETS EVERY OTHER DAY, Disp: 112 tablet, Rfl: 3   linaclotide  (LINZESS ) 145 MCG CAPS capsule, Take 1 capsule (145 mcg total) by mouth daily before breakfast., Disp: 30 capsule, Rfl: PRN   losartan  (COZAAR ) 25 MG tablet, TAKE 1 TABLET (25 MG TOTAL) BY MOUTH DAILY., Disp: 90 tablet, Rfl: 2   lovastatin  (MEVACOR ) 40 MG tablet, TAKE 1 TABLET BY MOUTH EVERY DAY IN THE EVENING, Disp: 90 tablet, Rfl: 3   metFORMIN  (GLUCOPHAGE ) 1000 MG tablet, Take 1 tablet (1,000 mg total) by mouth 2 (two) times daily with a meal. (Patient taking differently: Take 500 mg by mouth 2 (two) times daily with a meal.), Disp: 180 tablet, Rfl: 3   metoprolol  succinate (TOPROL -XL) 50 MG 24 hr tablet, Take 1 tablet (50 mg total) by mouth at bedtime. Hold if systolic BP  less than 100 or HR less than 50 bpm, Disp: 90 tablet, Rfl: 3   polyethylene glycol (MIRALAX  / GLYCOLAX ) 17 g packet, Take 17 g by mouth daily as needed for moderate constipation., Disp: , Rfl:    psyllium (METAMUCIL) 58.6 % packet, Take 1 packet by mouth daily as needed (Constipation)., Disp: , Rfl:    rOPINIRole  (REQUIP ) 0.5 MG tablet, Take 1 tablet (0.5 mg total) by mouth at bedtime., Disp: 90 tablet, Rfl: 3   Semaglutide ,0.25 or 0.5MG /DOS, 2 MG/3ML SOPN, Inject 0.5 mg into the skin once a week., Disp: 3 mL, Rfl:    triamcinolone  cream (KENALOG ) 0.1 %, Apply 1 Application topically 2 (two) times daily. X10 days as needed for rash, Disp: 30 g, Rfl: 0  Current Facility-Administered Medications:    cyanocobalamin  ((VITAMIN B-12)) injection 1,000 mcg, 1,000 mcg, Intramuscular, Q30 days, Harl Jayson CROME, MD, 1,000 mcg at 02/17/24 1052 Social History   Socioeconomic History   Marital status: Single    Spouse name: Not on file   Number of children: Not on file   Years of education: Not on file   Highest education  level: Not on file  Occupational History   Not on file  Tobacco Use   Smoking status: Never    Passive exposure: Never   Smokeless tobacco: Never  Vaping Use   Vaping status: Never Used  Substance and Sexual Activity   Alcohol use: No    Alcohol/week: 0.0 standard drinks of alcohol   Drug use: Never   Sexual activity: Not Currently    Birth control/protection: Post-menopausal  Other Topics Concern   Not on file  Social History Narrative   Not on file   Social Drivers  of Health   Financial Resource Strain: Low Risk  (04/21/2022)   Overall Financial Resource Strain (CARDIA)    Difficulty of Paying Living Expenses: Not hard at all  Food Insecurity: No Food Insecurity (03/22/2023)   Hunger Vital Sign    Worried About Running Out of Food in the Last Year: Never true    Ran Out of Food in the Last Year: Never true  Transportation Needs: No Transportation Needs (03/22/2023)   PRAPARE - Administrator, Civil Service (Medical): No    Lack of Transportation (Non-Medical): No  Physical Activity: Inactive (04/21/2022)   Exercise Vital Sign    Days of Exercise per Week: 0 days    Minutes of Exercise per Session: 0 min  Stress: No Stress Concern Present (04/21/2022)   [employer] of Occupational Health - Occupational Stress Questionnaire    Feeling of Stress : Not at all  Social Connections: Moderately Integrated (04/21/2022)   Social Connection and Isolation Panel    Frequency of Communication with Friends and Family: More than three times a week    Frequency of Social Gatherings with Friends and Family: Twice a week    Attends Religious Services: More than 4 times per year    Active Member of [employer] or Organizations: Yes    Attends Engineer, structural: More than 4 times per year    Marital Status: Never married  Intimate Partner Violence: Not At Risk (03/19/2023)   Humiliation, Afraid, Rape, and Kick questionnaire    Fear of Current or Ex-Partner: No     Emotionally Abused: No    Physically Abused: No    Sexually Abused: No   Family History  Problem Relation Age of Onset   COPD Father    Heart failure Father    Heart disease Father    Emphysema Father    Arrhythmia Sister    Arrhythmia Sister    Arrhythmia Sister        had PPM also   Cancer Sister     Objective: Office vital signs reviewed. BP (!) 112/53   Pulse (!) 52   Temp (!) 96.8 F (36 C)   Ht 5' 3 (1.6 m)   Wt 203 lb 12.8 oz (92.4 kg)   SpO2 97%   BMI 36.10 kg/m   Physical Examination:  General: Awake, alert, morbidly obese, No acute distress HEENT: Sclera white.  Moist mucous membranes Cardio: regular rate and rhythm, S1S2 heard, blowing murmurs appreciated Pulm: clear to auscultation bilaterally, no wheezes, rhonchi or rales; normal work of breathing on room air GI: Soft, nontender, nondistended.  Obese.  Lab Results  Component Value Date   HGBA1C 6.1 (H) 11/09/2023    Assessment/ Plan: 73 y.o. female   Diabetes mellitus treated with injections of non-insulin  medication (HCC) - Plan: Microalbumin / creatinine urine ratio, Bayer DCA Hb A1c Waived, CMP14+EGFR  Hypertension associated with diabetes (HCC) - Plan: CMP14+EGFR  Hyperlipidemia associated with type 2 diabetes mellitus (HCC) - Plan: CMP14+EGFR, Lipid Panel  Morbid obesity (HCC) - Plan: CMP14+EGFR  Irritable bowel syndrome with constipation - Plan: linaclotide  (LINZESS ) 290 MCG CAPS capsule  A1c is 5.4 today.  Okay to discontinue glipizide .  Continue metformin , Ozempic  for now.  Will continue to reduce regimen as able.  Continue to monitor blood sugars.  Urine microalbumin and renal function collected today  Lipid panel collected.  She will continue all medications as prescribed.  Blood pressure controlled  Sample of Linzess  290 mcg provided.  If she continues to have issues, may need to consider further evaluation.  I do not think she has SBO but it something that we have considered given  lack of bowel movement in the last several days.  She still having flatus which is reassuring.  No abdominal discomfort on exam today.  Encourage p.o. fluids and increase fiber  Medications Discontinued During This Encounter  Medication Reason   glipiZIDE  (GLUCOTROL ) 5 MG tablet Change in therapy      Norene CHRISTELLA Fielding, DO [facility] [phone]

## 2024-03-14 NOTE — Patient Instructions (Signed)
STOP GLIPIZIDE.  

## 2024-03-14 NOTE — Telephone Encounter (Signed)
 I spoke with patient and reviewed MRI results with her.  Results routed to PCP.  Appointment made for patient to see Dr Lavona on August 16, 2024 in [place]

## 2024-03-14 NOTE — Telephone Encounter (Signed)
 Pt is calling to get her MR Results

## 2024-03-15 ENCOUNTER — Ambulatory Visit: Payer: Self-pay | Admitting: Family Medicine

## 2024-03-15 LAB — CMP14+EGFR
ALT: 13 IU/L (ref 0–32)
AST: 19 IU/L (ref 0–40)
Albumin: 4.2 g/dL (ref 3.8–4.8)
Alkaline Phosphatase: 105 IU/L (ref 49–135)
BUN/Creatinine Ratio: 24 (ref 12–28)
BUN: 17 mg/dL (ref 8–27)
Bilirubin Total: 0.8 mg/dL (ref 0.0–1.2)
CO2: 23 mmol/L (ref 20–29)
Calcium: 9.1 mg/dL (ref 8.7–10.3)
Chloride: 102 mmol/L (ref 96–106)
Creatinine, Ser: 0.7 mg/dL (ref 0.57–1.00)
Globulin, Total: 2.2 g/dL (ref 1.5–4.5)
Glucose: 117 mg/dL — ABNORMAL HIGH (ref 70–99)
Potassium: 4.5 mmol/L (ref 3.5–5.2)
Sodium: 138 mmol/L (ref 134–144)
Total Protein: 6.4 g/dL (ref 6.0–8.5)
eGFR: 91 mL/min/1.73 (ref >59)

## 2024-03-15 LAB — MICROALBUMIN / CREATININE URINE RATIO
Creatinine, Urine: 60.4 mg/dL
Microalb/Creat Ratio: 43 mg/g{creat} — ABNORMAL HIGH (ref 0–29)
Microalbumin, Urine: 26.2 ug/mL

## 2024-03-15 LAB — LIPID PANEL
Chol/HDL Ratio: 3.8 ratio (ref 0.0–4.4)
Cholesterol, Total: 148 mg/dL (ref 100–199)
HDL: 39 mg/dL — ABNORMAL LOW (ref >39)
LDL Chol Calc (NIH): 88 mg/dL (ref 0–99)
Triglycerides: 114 mg/dL (ref 0–149)
VLDL Cholesterol Cal: 21 mg/dL (ref 5–40)

## 2024-03-15 NOTE — Telephone Encounter (Signed)
 Patient aware and verbalized understanding.

## 2024-03-21 ENCOUNTER — Ambulatory Visit (INDEPENDENT_AMBULATORY_CARE_PROVIDER_SITE_OTHER): Admitting: *Deleted

## 2024-03-21 DIAGNOSIS — E538 Deficiency of other specified B group vitamins: Secondary | ICD-10-CM

## 2024-03-21 NOTE — Progress Notes (Signed)
 Emily [name]                                          MRN: [id number]   03/21/2024   The [facility]BCI Quality Team Specialist reviewed this patient medical record for the purposes of chart review for care gap closure. The following were reviewed: abstraction for care gap closure-glycemic status assessment and kidney health evaluation for diabetes:eGFR  and uACR.    VBCI Quality Team

## 2024-03-21 NOTE — Progress Notes (Signed)
 Patient is in office today for a nurse visit for B12 Injection. Patient Injection was given in the  Right deltoid. Patient tolerated injection well.

## 2024-03-23 ENCOUNTER — Telehealth: Payer: Self-pay | Admitting: Cardiology

## 2024-03-23 NOTE — Telephone Encounter (Signed)
 Patient identification verified by 2 forms.   Called and spoke to patient.  Patient states:  -She woke up and checked her heart rate -Highest HR 130 bpm -HR is all over the place according to her pulse ox -Blood pressure: 134/76 116 8:30 119/64 101 9:30 125/56 115 -I feel fine right now -Usually take Cardizem  30 mg three time daily PRN for a fib episodes   Patient denies:  -Being in a fib at this time.              Interventions/Plan: -Pt will monitor her heart rate, she will only take the Cardizem  30 mg if her heart rate is maintaining at 120 or above.   Reviewed ED warning signs/precautions  Patient agrees with plan, no questions at this time

## 2024-03-23 NOTE — Telephone Encounter (Signed)
 STAT if HR is under 50 or over 120  (normal HR is 60-100 beats per minute)  What is your heart rate?  134/76 116  Do you have a log of your heart rate readings (document readings)?  Has been in the 90's.  Do you have any other symptoms?   No

## 2024-03-23 NOTE — Telephone Encounter (Signed)
  error

## 2024-03-23 NOTE — Telephone Encounter (Signed)
 Patient called back to say that her bp is 84/70 HR 98. Please advise

## 2024-03-23 NOTE — Telephone Encounter (Signed)
 Spoke with patient. She states that since her last call at 3:37pm, she checked her BP in both arms again:  R arm: 195/81, HR 61 L arm: 120/51, HR 61 R arm (recheck): 131/56  She states that at this time she is feeling okay and all around has not felt poorly since this morning when her HR was around 120 bpm. She states that this was associated with back pain between her shoulder blades, similar to previous episodes. She did take 30 mg diltiazem  at that time and has not taken another dose due to HR not sustaining at 120 bpm. She does not feel that she is in A-fib when she is having these elevated HRs.  Pt accepted appointment with LOIS Louder, PA-C, on 03/24/24 at 8:50am. She agrees to bring her BP monitor to this appointment for comparison purposes. Reviewed ED precautions. Pt expressed appreciation of call and denies additional questions or concerns at this time.

## 2024-03-23 NOTE — Progress Notes (Unsigned)
 Cardiology Office Note   Date:  03/24/2024  ID:  Emily [name], DOB 03/27/1951, MRN [id number] PCP: Emily Norene HERO, DO   HeartCare Providers Cardiologist:  Emily Schilling, MD Electrophysiologist:  Emily Gladis Norton, MD    History of Present Illness Emily [name] is a 73 y.o. female with a past medical history of PAF s/p watchman, GI bleed/AVMs of the small intestine, aortic stenosis, carotid artery disease.   Patient followed by Dr. Schilling. Has a history of PAF and is on flecainide . She has a history of GI bleeding and has a watchman device. She has a history of bradycardia and is cautiously on metoprolol  succinate 50 mg at bedtime with hold parameters. Her most recent echocardiogram was completed 12/07/23 and showed EF 60-65%, no regional wall motion abnormalities, severe LVH, normal RV systolic function, mild MR, severe MAC, mild-moderate AI, moderate-severe AS.   Underwent cardiac MRI 03/07/24 that showed asymmetric LV hypertrophy measuring up to 16 mm in septum. While meeting criteria for hypertrophic cardiomyopathy, it was suspected that LVH was due to severe AS. Visually AS appeared severe.   Patient called the office multiple times yesterday with concerns of variable HR. She noted that HR was in the 100s-110s. She took cardizem  and HR improved.   Patient presents today because her heart rate was variable yesterday.  She woke up with mild back discomfort.  She often has this symptom when her heart rate is elevated.  She checked her heart rate and it was in the 120s.  Throughout the day, her heart rate improved.  She d did not need to take her as needed diltiazem  because her heart rate improved.  She continues to have occasional episodes of tachycardia in the low 100s throughout the day but overall heart rate was much better.  That she did not sleep with her CPAP that night.  She does not drink much caffeine, does a decent job staying hydrated.  She is feeling well  today.  Slept with her CPAP and has not had recurrence of tachycardia.  Her heart rate is in the 50s.  She denies chest pain.  Denies shortness of breath.  Denies dizziness, syncope, near syncope.  Her blood pressure is elevated today in clinic.  We checked her home blood pressure cuff and it matched in office blood pressure reading.  She feels a bit stressed because it was a long drive to get to the office in the building was a bit complicated to get checked into.  She believes that her blood pressure is better controlled at home.  Provided 2-week blood pressure log.  We also discussed her severe aortic stenosis referred to structural heart.     Studies Reviewed  Cardiac Studies & Procedures   ______________________________________________________________________________________________   STRESS TESTS  NM MYOCAR MULTI W/SPECT W 09/17/2020  Narrative  There was no ST segment deviation noted during stress.  Findings consistent with prior distal anteior/apical myocardial infarction with mild peri-infarct ischemia.  The left ventricular ejection fraction is normal (55-65%).  Low risk based on perfusion imaging. Elevated TID 1.41 may suggest higher relative risk.   ECHOCARDIOGRAM  ECHOCARDIOGRAM COMPLETE 12/07/2023  Narrative ECHOCARDIOGRAM REPORT    Patient Name:   Emily [name] Date of Exam: 12/07/2023 Medical Rec #:  [id number]        Height:       63.0 in Accession #:    [id number]       Weight:       214.9 lb Date of Birth:  March 15, 1951        BSA:          1.994 m Patient Age:    72 years         BP:           160/68 mmHg Patient Gender: F                HR:           58 bpm. Exam Location:  [name]  Procedure: 2D Echo, Cardiac Doppler and Color Doppler (Both Spectral and Color Flow Doppler were utilized during procedure).  Indications:    Aortic Stenosis l35.0  History:        Patient has prior history of Echocardiogram examinations, most recent 02/22/2023. Mitral Valve  Disease and Aortic Valve Disease, Arrythmias:Atrial Fibrillation; Risk Factors:Hypertension, Diabetes and Dyslipidemia.  Sonographer:    Emily [name] RCS Referring Phys: [id number] Emily [name]  IMPRESSIONS   1. Left ventricular ejection fraction, by estimation, is 60 to 65%. The left ventricle has normal function. The left ventricle has no regional wall motion abnormalities. There is severe asymmetric left ventricular hypertrophy of the septal segment. Left ventricular diastolic function could not be evaluated. 2. Right ventricular systolic function is normal. The right ventricular size is normal. 3. Left atrial size was severely dilated. 4. Right atrial size was mildly dilated. 5. The mitral valve is abnormal. Mild mitral valve regurgitation. Mild mitral stenosis. The mean mitral valve gradient is 3.5 mmHg. Severe mitral annular calcification. 6. The aortic valve is tricuspid. There is severe calcifcation of the aortic valve. There is severe thickening of the aortic valve. Thdere is severe restriction in the opening of aortic valve leaflets. Aortic valve regurgitation is mild to moderate. Moderate to severe aortic valve stenosis. Aortic valve area, by VTI measures 0.74 cm. Aortic valve mean gradient measures 36.0 mmHg. Aortic valve Vmax measures 3.80 m/s. DVI is 0.23. 7. The inferior vena cava is normal in size with greater than 50% respiratory variability, suggesting right atrial pressure of 3 mmHg.  FINDINGS Left Ventricle: Left ventricular ejection fraction, by estimation, is 60 to 65%. The left ventricle has normal function. The left ventricle has no regional wall motion abnormalities. Strain was performed and the global longitudinal strain is indeterminate. The left ventricular internal cavity size was normal in size. There is severe asymmetric left ventricular hypertrophy of the septal segment. Left ventricular diastolic function could not be evaluated due to mitral annular  calcification (moderate or greater). Left ventricular diastolic function could not be evaluated.  Right Ventricle: The right ventricular size is normal. No increase in right ventricular wall thickness. Right ventricular systolic function is normal.  Left Atrium: Left atrial size was severely dilated.  Right Atrium: Right atrial size was mildly dilated.  Pericardium: There is no evidence of pericardial effusion.  Mitral Valve: The mitral valve is abnormal. Severe mitral annular calcification. Mild mitral valve regurgitation. Mild mitral valve stenosis. MV peak gradient, 16.4 mmHg. The mean mitral valve gradient is 3.5 mmHg.  Tricuspid Valve: The tricuspid valve is normal in structure. Tricuspid valve regurgitation is trivial. No evidence of tricuspid stenosis.  Aortic Valve: The aortic valve is tricuspid. There is severe calcifcation of the aortic valve. There is severe thickening of the aortic valve. Aortic valve regurgitation is mild to moderate. Moderate to severe aortic stenosis is present. Aortic valve mean gradient measures 36.0 mmHg. Aortic valve peak gradient measures 57.9 mmHg. Aortic valve area, by VTI measures 0.74 cm.  Pulmonic  Valve: The pulmonic valve was normal in structure. Pulmonic valve regurgitation is mild. No evidence of pulmonic stenosis.  Aorta: The aortic root is normal in size and structure.  Venous: The inferior vena cava is normal in size with greater than 50% respiratory variability, suggesting right atrial pressure of 3 mmHg.  IAS/Shunts: No atrial level shunt detected by color flow Doppler.  Additional Comments: 3D was performed not requiring image post processing on an independent workstation and was indeterminate.   LEFT VENTRICLE PLAX 2D LVIDd:         5.00 cm   Diastology LVIDs:         3.30 cm   LV e' medial:    3.30 cm/s LV PW:         1.40 cm   LV E/e' medial:  52.7 LV IVS:        1.50 cm   LV e' lateral:   2.89 cm/s LVOT diam:     2.00 cm   LV  E/e' lateral: 60.2 LV SV:         83 LV SV Index:   42 LVOT Area:     3.14 cm   RIGHT VENTRICLE RV S prime:     10.90 cm/s TAPSE (M-mode): 2.7 cm  LEFT ATRIUM              Index        RIGHT ATRIUM           Index LA diam:        4.40 cm  2.21 cm/m   RA Area:     21.00 cm LA Vol (A2C):   123.0 ml 61.70 ml/m  RA Volume:   67.30 ml  33.76 ml/m LA Vol (A4C):   96.5 ml  48.40 ml/m LA Biplane Vol: 107.0 ml 53.67 ml/m AORTIC VALVE AV Area (Vmax):    0.73 cm AV Area (Vmean):   0.75 cm AV Area (VTI):     0.74 cm AV Vmax:           380.50 cm/s AV Vmean:          286.500 cm/s AV VTI:            1.130 m AV Peak Grad:      57.9 mmHg AV Mean Grad:      36.0 mmHg LVOT Vmax:         88.20 cm/s LVOT Vmean:        68.100 cm/s LVOT VTI:          0.265 m LVOT/AV VTI ratio: 0.23  AORTA Ao Root diam: 3.30 cm  MITRAL VALVE                TRICUSPID VALVE MV Area (PHT): 2.91 cm     TR Peak grad:   7.1 mmHg MV Peak grad:  16.4 mmHg    TR Vmax:        133.00 cm/s MV Mean grad:  3.5 mmHg MV Vmax:       2.03 m/s     SHUNTS MV Vmean:      78.9 cm/s    Systemic VTI:  0.26 m MV Decel Time: 261 msec     Systemic Diam: 2.00 cm MV E velocity: 174.00 cm/s MV A velocity: 91.40 cm/s MV E/A ratio:  1.90  Vishnu Priya Mallipeddi Electronically signed by Diannah Late Mallipeddi Signature Date/Time: 12/07/2023/3:50:14 PM    Final    MONITORS  LONG TERM MONITOR (3-14 DAYS)  03/09/2023  Narrative The predominant rhythm was normal sinus. Rare ectopy No sustained arrhythmias.     CARDIAC MRI  MR CARDIAC MORPHOLOGY W WO CONTRAST 03/07/2024  Narrative CLINICAL DATA:  70F with severe LVH on echo  EXAM: CARDIAC MRI  TECHNIQUE: The patient was scanned on a 1.5 Tesla Siemens magnet. A dedicated cardiac coil was used. Functional imaging was done using Fiesta sequences. 2,3, and 4 chamber views were done to assess for RWMA's. Modified Simpson's rule using a short axis stack was used  to calculate an ejection fraction on a dedicated work [employer]. The patient received 10 cc of Gadavist. After 10 minutes inversion recovery sequences were used to assess for infiltration and scar tissue. Phase contrast velocity mapping was performed above the aortic and pulmonic valves  CONTRAST:  10 cc  of Gadavist  FINDINGS: Left ventricle:  -Asymmetric hypertrophy measuring up to 16mm in septum (11mm in posterior wall).  -Normal size  -Normal systolic function  -Normal ECV (27%)  -RV insertion site LGE  LV EF:  62% (Normal 52-79%)  Absolute volumes:  LV EDV: (Normal 78-167 mL)  LV ESV: 66mL (Normal 21-64 mL)  LV SV: (Normal 52-114 mL)  CO: 4.6L/min (Normal 2.7-6.3 L/min)  Indexed volumes:  LV EDV: 23mL/sq-m (Normal 50-96 mL/sq-m)  LV ESV: 65mL/sq-m (Normal 10-40 mL/sq-m)  LV SV: 49mL/sq-m (Normal 33-64 mL/sq-m)  CI: 2.3L/min/sq-m (Normal 1.9-3.9 L/min/sq-m)  Right ventricle: Normal size and systolic function  RV EF: 55% (Normal 52-80%)  Absolute volumes:  RV EDV: (Normal 79-175 mL)  RV ESV: 73mL (Normal 13-75 mL)  RV SV: 89mL (Normal 56-110 mL)  CO: 3.9L/min (Normal 2.7-6 L/min)  Indexed volumes:  RV EDV: 69mL/sq-m (Normal 51-97 mL/sq-m)  RV ESV: 55mL/sq-m (Normal 9-42 mL/sq-m)  RV SV: 79mL/sq-m (Normal 35-61 mL/sq-m)  CI: 1.9L/min/sq-m (Normal 1.8-3.8 L/min/sq-m)  Left atrium: Moderate enlargement  Right atrium: Normal size  Mitral valve: Mild regurgitation  Aortic valve: Mild regurgitation.  Visually appears severe stenosis  Tricuspid valve: Mild regurgitation  Pulmonic valve: No regurgitation  Aorta: Normal proximal ascending aorta  Pulmonary artery: Dilated main pulmonary artery measuring 30mm  Pericardium: Normal  IMPRESSION: 1. Asymmetric LV hypertrophy measuring up to 16mm in septum (11mm in posterior wall). While this meets criteria for hypertrophic cardiomyopathy, aortic  stenosis can also cause asymmetric LVH. Visually AS appears severe, and on review of recent echo appears severe AS. Suspect LVH is due to severe aortic stenosis  2.  Normal LV size and systolic function (EF 62%)  3.  Normal RV size and systolic function (55%)  4. RV insertion site late gadolinium enhancement, which is a nonspecific scar pattern often seen in setting of elevated pulmonary pressures  5.  Dilated main pulmonary artery measuring 30mm   Electronically Signed By: Lonni Nanas M.D. On: 03/08/2024 12:15   ______________________________________________________________________________________________       Risk Assessment/Calculations  CHA2DS2-VASc Score = 4   This indicates a 4.8% annual risk of stroke. The patient's score is based upon: CHF History: 0 HTN History: 1 Diabetes History: 0 Stroke History: 0 Vascular Disease History: 1 Age Score: 1 Gender Score: 1       Physical Exam VS:  BP (!) 162/60   Pulse (!) 58   Ht 5' 0.5 (1.537 m)   Wt 206 lb (93.4 kg)   SpO2 99%   BMI 39.57 kg/m        Wt Readings from Last 3 Encounters:  03/24/24 206 lb (  93.4 kg)  03/14/24 203 lb 12.8 oz (92.4 kg)  02/24/24 207 lb 6.4 oz (94.1 kg)    GEN: Well nourished, well developed in no acute distress NECK: No JVD  CARDIAC: RRR, grade 3/6 systolic murmur with loss of S1/S2 RESPIRATORY:  Clear to auscultation without rales, wheezing or rhonchi.  Normal work of breathing on room air ABDOMEN: Soft, non-tender, non-distended EXTREMITIES:  No edema in bilateral lower extremities; No deformity   ASSESSMENT AND PLAN  PAF  Bradycardia , Tachycardia  - Has history of GI bleed and is s/p watchman. Not currently on oral anticoagulation.  No GI bleeding recently. Continue plavix  75 mg daily  - Patient had a few episodes of tachycardia yesterday. When she woke up, HR was in the 120s. Throughout the day continued to have episodes in the low 100s. Did not wear her CPAP  that night. Did not drink caffeine  - EKG today shows sinus bradycardia with heart rate 56 bpm, RBBB, left anterior fascicular block. - Patient denies syncope or near syncope. - With somewhat variable heart rate, ordered 14-day ZIO. This will also make sure she is not having episodes of afib when tachy  - Encouraged compliance with CPAP - Continue Cardizem  CD 180 mg every morning, flecainide  100 mg twice daily, metoprolol  succinate 50 mg every evening.  She has an additional 30 mg diltiazem  she can take if needed for sustained tachycardia   Severe Aortic Stenosis  Mild-moderate Aoritc Regurgitation  Mild MR  - Noted on echocardiogram in 11/2023. [facility]RI in 03/2024 confirmed severe AS which was causing LVH  - Patient denies chest pain, dyspnea on exertion, dizziness, syncope or near syncope.  However, as she has severe AS and LVH on echocardiogram, I think a be wise for her to establish with structural heart.  Patient is in agreement with this.  Sent referral  LVH  - Noted on CMRI in 03/2024, measuring 16 mm. Thought to be due to severe AS  - Patient is euvolemic on exam today.  No lower extremity swelling.  Continue Lasix  20 mg as needed for fluid  HTN  - BP is a bit elevated in clinic today.  She does admit to being very anxious today.  Also had a long drive in and a stressful check-in process. -For now, okay to have patient keep a 14-day blood pressure log.  I checked her home blood pressure cuff today and it is accurate - Continue diltiazem  180 mg daily, losartan  25 mg daily, metoprolol  succinate 50 mg daily - Creatinine 0.7, K 4.5 earlier this month - If BP is elevated at home, increase losartan   HLD  - Lipid panel earlier this month showed HDL 39, LDL 88, triglycerides 114, total cholesterol 148 - Continue Zetia  10 mg daily and lovastatin  40 mg daily  Carotid Artery Disease  - Carotid ultrasounds in 11/2023 showed 50-75% stenosis in right ICA, 1-39% stenosis in left ICA.  She follows  with vascular surgery, they are repeating scans in 06/2024 -Continue Plavix  75 mg daily  - Continue Zetia  10 mg daily and lovastatin  40 mg daily   Dispo: Follow up with me in 3 months, then Dr. Lavona in 07/2024 as planned   Signed, Rollo FABIENE Louder, PA-C

## 2024-03-24 ENCOUNTER — Ambulatory Visit: Attending: Cardiology | Admitting: Cardiology

## 2024-03-24 ENCOUNTER — Ambulatory Visit (INDEPENDENT_AMBULATORY_CARE_PROVIDER_SITE_OTHER)

## 2024-03-24 ENCOUNTER — Encounter: Payer: Self-pay | Admitting: Cardiology

## 2024-03-24 VITALS — BP 162/60 | HR 58 | Ht 60.5 in | Wt 206.0 lb

## 2024-03-24 DIAGNOSIS — I10 Essential (primary) hypertension: Secondary | ICD-10-CM | POA: Diagnosis not present

## 2024-03-24 DIAGNOSIS — I4719 Other supraventricular tachycardia: Secondary | ICD-10-CM

## 2024-03-24 DIAGNOSIS — R001 Bradycardia, unspecified: Secondary | ICD-10-CM

## 2024-03-24 DIAGNOSIS — I350 Nonrheumatic aortic (valve) stenosis: Secondary | ICD-10-CM | POA: Diagnosis not present

## 2024-03-24 DIAGNOSIS — I6523 Occlusion and stenosis of bilateral carotid arteries: Secondary | ICD-10-CM

## 2024-03-24 DIAGNOSIS — I4891 Unspecified atrial fibrillation: Secondary | ICD-10-CM

## 2024-03-24 DIAGNOSIS — I050 Rheumatic mitral stenosis: Secondary | ICD-10-CM

## 2024-03-24 NOTE — Progress Notes (Unsigned)
Applied a 14 day Zio XT monitor to patient in the office  [place] to read

## 2024-03-24 NOTE — Patient Instructions (Signed)
 Medication Instructions:  Your physician recommends that you continue on your current medications as directed. Please refer to the Current Medication list given to you today.  *If you need a refill on your cardiac medications before your next appointment, please call your pharmacy*  Testing/Procedures: ZIO XT- Long Term Monitor Instructions  Your physician has requested you wear a ZIO patch monitor for 14 days.  This is a single patch monitor. [employer] supplies one patch monitor per enrollment. Additional stickers are not available. Please do not apply patch if you will be having a Nuclear Stress Test,  Echocardiogram, Cardiac CT, MRI, or Chest Xray during the period you would be wearing the  monitor. The patch cannot be worn during these tests. You cannot remove and re-apply the  ZIO XT patch monitor.   Billing and Patient Assistance Program Information  We have supplied [employer] with any of your insurance information on file for billing purposes. [employer] offers a sliding scale Patient Assistance Program for patients that do not have  insurance, or whose insurance does not completely cover the cost of the ZIO monitor.  You must apply for the Patient Assistance Program to qualify for this discounted rate.  To apply, please call [employer] at [phone], select option 4, select option 2, ask to apply for  Patient Assistance Program. Meredeth will ask your household income, and how many people  are in your household. They will quote your out-of-pocket cost based on that information.  [employer] will also be able to set up a 28-month, interest-free payment plan if needed.  When you are ready to remove the patch, follow instructions on the last 2 pages of Patient  Logbook. Stick patch monitor onto the last page of Patient Logbook.  Place Patient Logbook in the blue and white box. Use locking tab on box and tape box closed  securely. The blue and white box has prepaid postage on it. Please place it  in the mailbox as  soon as possible. Your physician should have your test results approximately 7 days after the  monitor has been mailed back to [facility].  Call [facility] Customer Care at [phone] if you have questions regarding  your ZIO XT patch monitor. Call them immediately if you see an orange light blinking on your  monitor.  If your monitor falls off in less than 4 days, contact our Monitor department at [phone].  If your monitor becomes loose or falls off after 4 days call [employer] at [phone] for  suggestions on securing your monitor   Follow-Up: At [facility], you and your health needs are our priority.  As part of our continuing mission to provide you with exceptional heart care, our providers are all part of one team.  This team includes your primary Cardiologist (physician) and Advanced Practice Providers or APPs (Physician Assistants and Nurse Practitioners) who all work together to provide you with the care you need, when you need it.  Your next appointment:   3 month(s)  Provider:   Rollo Louder, PA-C          We recommend signing up for the patient portal called MyChart.  Sign up information is provided on this After Visit Summary.  MyChart is used to connect with patients for Virtual Visits (Telemedicine).  Patients are able to view lab/test results, encounter notes, upcoming appointments, etc.  Non-urgent messages can be sent to your provider as well.   To learn more about what you can do with MyChart, go to  [email or web address].   Other Instructions Referral sent to [facility]   We need to get a better idea of what your blood pressure is running at home. Here are some instructions to follow: - I would recommend using a blood pressure cuff that goes on your arm. The wrist ones can be inaccurate. If you're purchasing one for the first time, try to select one that also reports your heart rate because this can be  helpful information as well. - To check your blood pressure, choose a time at least 3 hours after taking your blood pressure medicines. If you can sample it at different times of the day, that's great - it might give you more information about how your blood pressure fluctuates. Remain seated in a chair for 5 minutes quietly beforehand, then check it.  - Please record a list of those readings and call us /send in MyChart message with them for our review on 04/07/24.

## 2024-03-25 ENCOUNTER — Telehealth: Payer: Self-pay

## 2024-03-25 NOTE — Telephone Encounter (Signed)
 I received a page asking to call the patient. I called them and the patient said they are feeling palpitations and dizzy. HR is in 140s. They also live alone and would like to call 911 and go to the ED. I told the patient I agree and to goa head with 911.

## 2024-03-26 NOTE — Telephone Encounter (Signed)
 Called to follow up on patient since MD recommended hospital. Patient states she called EMS and when they arrived her HR was back in normal 50-60's with no signs of Afib and symptoms resolved. This morning, patient is still asymptomatic with HR in 50's. Patient is wearing Zio monitor still but forget to press trigger during this episode.  Discussed to call heartcare back as needed if any changes.

## 2024-03-27 ENCOUNTER — Telehealth: Payer: Self-pay | Admitting: Family Medicine

## 2024-03-28 MED ORDER — ACCU-CHEK SOFTCLIX LANCETS MISC: 3 refills | Status: DC

## 2024-03-28 NOTE — Telephone Encounter (Signed)
 Pt needs lancets sent to [facility], sent

## 2024-04-03 ENCOUNTER — Other Ambulatory Visit: Payer: Self-pay

## 2024-04-03 ENCOUNTER — Encounter (HOSPITAL_COMMUNITY): Payer: Self-pay

## 2024-04-03 ENCOUNTER — Observation Stay (HOSPITAL_COMMUNITY)
Admission: EM | Admit: 2024-04-03 | Discharge: 2024-04-04 | Disposition: A | Attending: Internal Medicine | Admitting: Internal Medicine

## 2024-04-03 ENCOUNTER — Emergency Department (HOSPITAL_COMMUNITY)

## 2024-04-03 DIAGNOSIS — Z7901 Long term (current) use of anticoagulants: Secondary | ICD-10-CM | POA: Insufficient documentation

## 2024-04-03 DIAGNOSIS — I152 Hypertension secondary to endocrine disorders: Secondary | ICD-10-CM | POA: Diagnosis present

## 2024-04-03 DIAGNOSIS — E039 Hypothyroidism, unspecified: Secondary | ICD-10-CM | POA: Insufficient documentation

## 2024-04-03 DIAGNOSIS — I350 Nonrheumatic aortic (valve) stenosis: Secondary | ICD-10-CM

## 2024-04-03 DIAGNOSIS — E034 Atrophy of thyroid (acquired): Secondary | ICD-10-CM | POA: Diagnosis present

## 2024-04-03 DIAGNOSIS — Z79899 Other long term (current) drug therapy: Secondary | ICD-10-CM | POA: Insufficient documentation

## 2024-04-03 DIAGNOSIS — I480 Paroxysmal atrial fibrillation: Principal | ICD-10-CM

## 2024-04-03 DIAGNOSIS — E119 Type 2 diabetes mellitus without complications: Secondary | ICD-10-CM | POA: Insufficient documentation

## 2024-04-03 DIAGNOSIS — Z794 Long term (current) use of insulin: Secondary | ICD-10-CM | POA: Insufficient documentation

## 2024-04-03 DIAGNOSIS — G2581 Restless legs syndrome: Secondary | ICD-10-CM | POA: Insufficient documentation

## 2024-04-03 DIAGNOSIS — Z95818 Presence of other cardiac implants and grafts: Secondary | ICD-10-CM | POA: Diagnosis present

## 2024-04-03 DIAGNOSIS — G4733 Obstructive sleep apnea (adult) (pediatric): Secondary | ICD-10-CM | POA: Insufficient documentation

## 2024-04-03 DIAGNOSIS — I10 Essential (primary) hypertension: Secondary | ICD-10-CM | POA: Insufficient documentation

## 2024-04-03 DIAGNOSIS — Z7985 Long-term (current) use of injectable non-insulin antidiabetic drugs: Secondary | ICD-10-CM

## 2024-04-03 DIAGNOSIS — I4891 Unspecified atrial fibrillation: Principal | ICD-10-CM | POA: Diagnosis present

## 2024-04-03 DIAGNOSIS — E785 Hyperlipidemia, unspecified: Secondary | ICD-10-CM | POA: Insufficient documentation

## 2024-04-03 LAB — CBC WITH DIFFERENTIAL/PLATELET
Abs Immature Granulocytes: 0.05 K/uL (ref 0.00–0.07)
Basophils Absolute: 0.1 K/uL (ref 0.0–0.1)
Basophils Relative: 1 %
Eosinophils Absolute: 0.2 K/uL (ref 0.0–0.5)
Eosinophils Relative: 2 %
HCT: 39.4 % (ref 36.0–46.0)
Hemoglobin: 12.7 g/dL (ref 12.0–15.0)
Immature Granulocytes: 1 %
Lymphocytes Relative: 8 %
Lymphs Abs: 0.7 K/uL (ref 0.7–4.0)
MCH: 29.9 pg (ref 26.0–34.0)
MCHC: 32.2 g/dL (ref 30.0–36.0)
MCV: 92.7 fL (ref 80.0–100.0)
Monocytes Absolute: 0.7 K/uL (ref 0.1–1.0)
Monocytes Relative: 8 %
Neutro Abs: 7.1 K/uL (ref 1.7–7.7)
Neutrophils Relative %: 80 %
Platelets: 209 K/uL (ref 150–400)
RBC: 4.25 MIL/uL (ref 3.87–5.11)
RDW: 15.4 % (ref 11.5–15.5)
WBC: 8.7 K/uL (ref 4.0–10.5)
nRBC: 0 % (ref 0.0–0.2)

## 2024-04-03 LAB — COMPREHENSIVE METABOLIC PANEL WITH GFR
ALT: 16 U/L (ref 0–44)
AST: 20 U/L (ref 15–41)
Albumin: 3.8 g/dL (ref 3.5–5.0)
Alkaline Phosphatase: 95 U/L (ref 38–126)
Anion gap: 13 (ref 5–15)
BUN: 17 mg/dL (ref 8–23)
CO2: 25 mmol/L (ref 22–32)
Calcium: 9.2 mg/dL (ref 8.9–10.3)
Chloride: 99 mmol/L (ref 98–111)
Creatinine, Ser: 0.66 mg/dL (ref 0.44–1.00)
GFR, Estimated: >60 mL/min (ref >60)
Glucose, Bld: 251 mg/dL — ABNORMAL HIGH (ref 70–99)
Potassium: 3.8 mmol/L (ref 3.5–5.1)
Sodium: 137 mmol/L (ref 135–145)
Total Bilirubin: 1.3 mg/dL — ABNORMAL HIGH (ref 0.0–1.2)
Total Protein: 7.1 g/dL (ref 6.5–8.1)

## 2024-04-03 LAB — BRAIN NATRIURETIC PEPTIDE: B Natriuretic Peptide: 558.5 pg/mL — ABNORMAL HIGH (ref 0.0–100.0)

## 2024-04-03 LAB — TROPONIN I (HIGH SENSITIVITY): Troponin I (High Sensitivity): 13 ng/L (ref <18)

## 2024-04-03 MED ORDER — SODIUM CHLORIDE 0.9 % IV BOLUS
500.0000 mL | Freq: Once | INTRAVENOUS | Status: AC
  Administered 2024-04-03: 500 mL via INTRAVENOUS

## 2024-04-03 MED ORDER — DILTIAZEM HCL-DEXTROSE 125-5 MG/125ML-% IV SOLN (PREMIX)
5.0000 mg/h | INTRAVENOUS | Status: DC
  Administered 2024-04-04: 5 mg/h via INTRAVENOUS
  Filled 2024-04-03: qty 125

## 2024-04-03 MED ORDER — DILTIAZEM LOAD VIA INFUSION
15.0000 mg | Freq: Once | INTRAVENOUS | Status: AC
  Administered 2024-04-03: 15 mg via INTRAVENOUS
  Filled 2024-04-03: qty 15

## 2024-04-03 MED ORDER — ETOMIDATE 2 MG/ML IV SOLN
10.0000 mg | Freq: Once | INTRAVENOUS | Status: DC
  Filled 2024-04-03: qty 10

## 2024-04-03 NOTE — ED Triage Notes (Signed)
 PT brought in by [facility], PT called out due to AFIB RVR, PT initial hr was 132. PT was given bolus of NS en route. PT states she did take her 30mg  of cardizem  PO. PT HR was noted to be 120. PT states she has a history of Afib and takes cardizem  and has a youth worker.

## 2024-04-03 NOTE — ED Provider Notes (Addendum)
 [place] EMERGENCY DEPARTMENT AT [facility] Provider Note   CSN: [id number] Arrival date & time: 04/03/24  2220     Patient presents with: Atrial Fibrillation   Emily [name] is a 73 y.o. female patient with history of paroxysmal atrial fibrillation with a watchman and on Plavix , hypertension, and diabetes who presents to the emergency department today for further evaluation of palpitations and fast heart rate.  She states symptoms started around 8 PM while she was watching some sports.  She does state that her team was winning.  She also states that she had a lean cuisine for dinner and took a fluid pill as she was having some bilateral lower extremity edema.  She does take this as needed.  Chart review does reveal most recent echocardiogram performed earlier this year showed moderate to severe aortic stenosis and a normal ejection fraction.  Patient states she is having some back pain and palpitations which she states is typical when she has atrial fibrillation episodes.  She has needed cardioversion in the past but is also come out spontaneously.  She denies shortness of breath, nausea, vomiting, diarrhea, recent fever, chills, cough, congestion, sore throat, abdominal pain.    Atrial Fibrillation       Prior to Admission medications   Medication Sig Start Date End Date Taking? Authorizing Provider  Accu-Chek Softclix Lancets lancets Test BS daily Dx E11.69 03/28/24   Jolinda Potter M, DO  amoxicillin -clavulanate (AUGMENTIN ) 875-125 MG tablet Take 1 tablet by mouth 2 (two) times daily. 02/24/24   Severa Rock HERO, FNP  bisacodyl  (DULCOLAX) 5 MG EC tablet Take 5 mg by mouth daily as needed (constipation).    [provider]  clopidogrel  (PLAVIX ) 75 MG tablet TAKE 1 TABLET BY MOUTH EVERY DAY 08/02/23   Bethanie Cough, PA-C  diltiazem  (CARDIZEM  CD) 180 MG 24 hr capsule TAKE 1 CAPSULE BY MOUTH EVERY DAY 10/28/23   Lavona Agent, MD  diltiazem  (CARDIZEM ) 30 MG  tablet Take 1 tablet (30 mg total) by mouth 3 (three) times daily as needed (afib). 01/14/24   Meng, Hao, PA  diphenhydramine -acetaminophen  (TYLENOL  PM) 25-500 MG TABS tablet Take 1 tablet by mouth at bedtime as needed (Pain).    [provider]  ezetimibe  (ZETIA ) 10 MG tablet TAKE 1 TABLET BY MOUTH EVERY DAY 01/06/24   Lavona Agent, MD  flecainide  (TAMBOCOR ) 100 MG tablet TAKE 1 TABLET BY MOUTH TWICE A DAY 10/28/23   Lavona Agent, MD  furosemide  (LASIX ) 20 MG tablet Take 1 tablet (20 mg total) by mouth daily as needed for fluid. 05/18/23 03/07/26  Jolinda Potter HERO, DO  gabapentin  (NEURONTIN ) 300 MG capsule TAKE 1 CAPSULE (300 MG TOTAL) BY MOUTH 2 (TWO) TIMES DAILY AS NEEDED (NEUROPATHY RIGHT LEG). 01/03/24   Jolinda Potter M, DO  glucose blood (ACCU-CHEK GUIDE TEST) test strip Test BS daily Dx E11.69 02/23/24   Jolinda Potter HERO, DO  levonorgestrel (MIRENA) 20 MCG/DAY IUD 1 each by Intrauterine route once.    [provider]  levothyroxine  (SYNTHROID ) 112 MCG tablet TAKE 1 TABLET BY MOUTH EVERY OTHER DAY, ALTERNATING WITH 1 & 1/2 TABLETS EVERY OTHER DAY 01/03/24   Jolinda Potter M, DO  linaclotide  (LINZESS ) 290 MCG CAPS capsule Take 1 capsule (290 mcg total) by mouth daily before breakfast. 03/14/24   Jolinda Potter M, DO  losartan  (COZAAR ) 25 MG tablet TAKE 1 TABLET (25 MG TOTAL) BY MOUTH DAILY. 01/06/24   Lavona Agent, MD  lovastatin  (MEVACOR ) 40 MG tablet TAKE  1 TABLET BY MOUTH EVERY DAY IN THE EVENING 02/23/24   Lavona Agent, MD  metFORMIN  (GLUCOPHAGE ) 1000 MG tablet Take 1 tablet (1,000 mg total) by mouth 2 (two) times daily with a meal. Patient taking differently: Take 500 mg by mouth 2 (two) times daily with a meal. 03/02/23   Jolinda Potter M, DO  metoprolol  succinate (TOPROL -XL) 50 MG 24 hr tablet Take 1 tablet (50 mg total) by mouth at bedtime. Hold if systolic BP  less than 100 or HR less than 50 bpm 06/03/23   West, Katlyn D, NP  polyethylene glycol (MIRALAX   / GLYCOLAX ) 17 g packet Take 17 g by mouth daily as needed for moderate constipation.    [provider]  psyllium (METAMUCIL) 58.6 % packet Take 1 packet by mouth daily as needed (Constipation).    [provider]  rOPINIRole  (REQUIP ) 0.5 MG tablet Take 1 tablet (0.5 mg total) by mouth at bedtime. 03/02/23   Jolinda Potter HERO, DO  Semaglutide ,0.25 or 0.5MG /DOS, 2 MG/3ML SOPN Inject 0.5 mg into the skin once a week. 06/09/22   Jolinda Potter HERO, DO  triamcinolone  cream (KENALOG ) 0.1 % Apply 1 Application topically 2 (two) times daily. X10 days as needed for rash 07/13/23   Jolinda Potter HERO, DO    Allergies: Jardiance  [empagliflozin ], Quinapril hcl, Statins, and Tape    Review of Systems  All other systems reviewed and are negative.   Updated Vital Signs BP (!) 142/70   Pulse 97   Temp 97.9 F (36.6 C) (Oral)   Resp (!) 23   Ht 5' 3 (1.6 m)   Wt 90.7 kg   SpO2 96%   BMI 35.43 kg/m   Physical Exam Vitals and nursing note reviewed.  Constitutional:      General: She is not in acute distress.    Appearance: Normal appearance.  HENT:     Head: Normocephalic and atraumatic.  Eyes:     General:        Right eye: No discharge.        Left eye: No discharge.  Cardiovascular:     Rate and Rhythm: Tachycardia present. Rhythm irregularly irregular.     Heart sounds: Murmur heard.     Comments: Trace pitting edema to the midshin bilaterally. Pulmonary:     Comments: Clear to auscultation bilaterally.  Normal effort.  No respiratory distress.  No evidence of wheezes, rales, or rhonchi heard throughout. Abdominal:     General: Abdomen is flat. Bowel sounds are normal. There is no distension.     Tenderness: There is no abdominal tenderness. There is no guarding or rebound.  Musculoskeletal:        General: Normal range of motion.     Cervical back: Neck supple.     Right lower leg: Pitting Edema present.     Left lower leg: Pitting Edema present.  Skin:     General: Skin is warm and dry.     Findings: No rash.  Neurological:     General: No focal deficit present.     Mental Status: She is alert.  Psychiatric:        Mood and Affect: Mood normal.        Behavior: Behavior normal.     (all labs ordered are listed, but only abnormal results are displayed) Labs Reviewed  COMPREHENSIVE METABOLIC PANEL WITH GFR - Abnormal; Notable for the following components:      Result Value   Glucose, Bld 251 (*)  Total Bilirubin 1.3 (*)    All other components within normal limits  BRAIN NATRIURETIC PEPTIDE - Abnormal; Notable for the following components:   B Natriuretic Peptide 558.5 (*)    All other components within normal limits  TROPONIN I (HIGH SENSITIVITY) - Abnormal; Notable for the following components:   Troponin I (High Sensitivity) 23 (*)    All other components within normal limits  CBC WITH DIFFERENTIAL/PLATELET  TROPONIN I (HIGH SENSITIVITY)    EKG: None  Radiology: DG Chest 2 View Result Date: 04/03/2024 EXAM: 2 VIEW(S) XRAY OF THE CHEST 04/03/2024 11:10:53 PM COMPARISON: 05/06/2023 CLINICAL HISTORY: palpitations palpitations FINDINGS: LUNGS AND PLEURA: No focal pulmonary opacity. No pulmonary edema. No pleural effusion. No pneumothorax. HEART AND MEDIASTINUM: Aortic atherosclerosis. Borderline heart size. BONES AND SOFT TISSUES: No acute osseous abnormality. IMPRESSION: 1. No acute cardiopulmonary process identified. 2. Borderline cardiomegaly. Electronically signed by: Franky Crease MD 04/03/2024 11:40 PM EST RP Workstation: [id number]     .Critical Care  Performed by: Theotis Cameron HERO, PA-C Authorized by: Theotis Cameron HERO, PA-C   Critical care provider statement:    Critical care time (minutes):  35   Critical care time was exclusive of:  Separately billable procedures and treating other patients   Critical care was necessary to treat or prevent imminent or life-threatening deterioration of the following conditions:   Cardiac failure   Critical care was time spent personally by me on the following activities:  Blood draw for specimens, development of treatment plan with patient or surrogate, discussions with consultants, evaluation of patient's response to treatment, ordering and performing treatments and interventions, ordering and review of laboratory studies, ordering and review of radiographic studies, re-evaluation of patient's condition and pulse oximetry    Medications Ordered in the ED  diltiazem  (CARDIZEM ) 1 mg/mL load via infusion 15 mg (15 mg Intravenous Bolus from Bag 04/03/24 2351)    And  diltiazem  (CARDIZEM ) 125 mg in dextrose  5% 125 mL (1 mg/mL) infusion (0.5 mg/hr Intravenous Rate/Dose Change 04/04/24 0259)  sodium chloride  0.9 % bolus 500 mL (0 mLs Intravenous Stopped 04/04/24 0127)    Clinical Course as of 04/04/24 0340  Mon Apr 03, 2024  2318 Patient took diltiazem  after speaking with her cardiologist on-call around 8 PM without any change.  Patient is received 250 bolus without any change.  [CF]  2318 CBC with Differential Negative.  [CF]  Tue Apr 04, 2024  0101 I spoke with Dr. Gail with cardiology who will come evaluate the patient at bedside. [CF]  0138 I spoke with Dr. Gail who came and evaluated the patient at the bedside with cardiology.  He recommends admission to the hospital service, they will consult.  They will pause flecainide  for now and we can keep her on the Cardizem  drip. [CF]  [phone] While patient has been waiting for a bed, she converted while on diltiazem .  We lowered diltiazem  drip for now.  Cardiology notified. [CF]  0340 Comprehensive metabolic panel(!) Negative. [CF]  0340 Troponin I (High Sensitivity)(!) Elevated but likely secondary to atrial fibrillation RVR and demand. [CF]  0340 Brain natriuretic peptide(!) Likely due to demand.  Chest x-ray does not show any evidence of volume overload.  Patient appears euvolemic on exam. [CF]    Clinical Course User  Index [CF] Theotis Cameron HERO, PA-C    Medical Decision Making Vyolet Sakuma is a 73 y.o. female patient who presents to the emergency department today for further evaluation of atrial fibrillation with RVR.  Patient  arrived normal tensive and tachycardic into the 120s showing signs of atrial fibrillation.  She has had this in the past and has required cardioversion.  Patient is also gotten out of atrial fibrillation spontaneously in the past as well.  Will plan to give her half liter bolus.  Low suspicion for heart failure at this time.  She does not not appear to be clinically volume overloaded.  Although she did notice some bilateral leg swelling so we will look add a BNP.  Will also get some troponins as well.   Give the patient Cardizem  load. Patient is not on Plavix  and does have watchmen device. Not immediate cardioversion candidate.  Cardiology was consulted who evaluate the patient at the bedside.  Will plan to admit per their recommendations continue Cardizem .  Patient overall stable for admission at this time. I spoke with Dr. Franky with [facility]ad hospitalist who agrees to admit the patient.  Amount and/or Complexity of Data Reviewed Labs: ordered. Decision-making details documented in ED Course. Radiology: ordered.  Risk Prescription drug management. Decision regarding hospitalization.     Final diagnoses:  Atrial fibrillation with RVR [facility])    ED Discharge Orders     None          Theotis Cameron HERO, [place] 04/04/24 0340    Theotis Cameron HERO, PA-C 04/04/24 0340    Pamella Ozell LABOR, DO 04/11/24 [phone]

## 2024-04-04 ENCOUNTER — Telehealth: Payer: Self-pay | Admitting: Cardiology

## 2024-04-04 ENCOUNTER — Other Ambulatory Visit: Payer: Self-pay

## 2024-04-04 ENCOUNTER — Encounter (HOSPITAL_COMMUNITY): Payer: Self-pay | Admitting: Internal Medicine

## 2024-04-04 ENCOUNTER — Other Ambulatory Visit (HOSPITAL_COMMUNITY): Payer: Self-pay

## 2024-04-04 DIAGNOSIS — I480 Paroxysmal atrial fibrillation: Principal | ICD-10-CM

## 2024-04-04 DIAGNOSIS — G4733 Obstructive sleep apnea (adult) (pediatric): Secondary | ICD-10-CM

## 2024-04-04 DIAGNOSIS — I4891 Unspecified atrial fibrillation: Secondary | ICD-10-CM | POA: Diagnosis not present

## 2024-04-04 DIAGNOSIS — E119 Type 2 diabetes mellitus without complications: Secondary | ICD-10-CM

## 2024-04-04 DIAGNOSIS — E1159 Type 2 diabetes mellitus with other circulatory complications: Secondary | ICD-10-CM

## 2024-04-04 DIAGNOSIS — Z7985 Long-term (current) use of injectable non-insulin antidiabetic drugs: Secondary | ICD-10-CM

## 2024-04-04 DIAGNOSIS — I152 Hypertension secondary to endocrine disorders: Secondary | ICD-10-CM

## 2024-04-04 DIAGNOSIS — E034 Atrophy of thyroid (acquired): Secondary | ICD-10-CM

## 2024-04-04 DIAGNOSIS — I350 Nonrheumatic aortic (valve) stenosis: Secondary | ICD-10-CM | POA: Diagnosis not present

## 2024-04-04 DIAGNOSIS — Z95818 Presence of other cardiac implants and grafts: Secondary | ICD-10-CM

## 2024-04-04 LAB — CBC WITH DIFFERENTIAL/PLATELET
Abs Immature Granulocytes: 0.03 K/uL (ref 0.00–0.07)
Basophils Absolute: 0 K/uL (ref 0.0–0.1)
Basophils Relative: 1 %
Eosinophils Absolute: 0.1 K/uL (ref 0.0–0.5)
Eosinophils Relative: 1 %
HCT: 33.8 % — ABNORMAL LOW (ref 36.0–46.0)
Hemoglobin: 11 g/dL — ABNORMAL LOW (ref 12.0–15.0)
Immature Granulocytes: 0 %
Lymphocytes Relative: 10 %
Lymphs Abs: 0.7 K/uL (ref 0.7–4.0)
MCH: 30.1 pg (ref 26.0–34.0)
MCHC: 32.5 g/dL (ref 30.0–36.0)
MCV: 92.3 fL (ref 80.0–100.0)
Monocytes Absolute: 0.7 K/uL (ref 0.1–1.0)
Monocytes Relative: 8 %
Neutro Abs: 6.2 K/uL (ref 1.7–7.7)
Neutrophils Relative %: 80 %
Platelets: 210 K/uL (ref 150–400)
RBC: 3.66 MIL/uL — ABNORMAL LOW (ref 3.87–5.11)
RDW: 15.5 % (ref 11.5–15.5)
WBC: 7.7 K/uL (ref 4.0–10.5)
nRBC: 0 % (ref 0.0–0.2)

## 2024-04-04 LAB — HEMOGLOBIN A1C
Hgb A1c MFr Bld: 5.5 % (ref 4.8–5.6)
Mean Plasma Glucose: 111.15 mg/dL

## 2024-04-04 LAB — TROPONIN I (HIGH SENSITIVITY)
Troponin I (High Sensitivity): 23 ng/L — ABNORMAL HIGH (ref <18)
Troponin I (High Sensitivity): 160 ng/L (ref <18)

## 2024-04-04 LAB — BASIC METABOLIC PANEL WITH GFR
Anion gap: 11 (ref 5–15)
BUN: 15 mg/dL (ref 8–23)
CO2: 26 mmol/L (ref 22–32)
Calcium: 8.7 mg/dL — ABNORMAL LOW (ref 8.9–10.3)
Chloride: 103 mmol/L (ref 98–111)
Creatinine, Ser: 0.59 mg/dL (ref 0.44–1.00)
GFR, Estimated: >60 mL/min (ref >60)
Glucose, Bld: 167 mg/dL — ABNORMAL HIGH (ref 70–99)
Potassium: 3.9 mmol/L (ref 3.5–5.1)
Sodium: 140 mmol/L (ref 135–145)

## 2024-04-04 LAB — CBG MONITORING, ED
Glucose-Capillary: 146 mg/dL — ABNORMAL HIGH (ref 70–99)
Glucose-Capillary: 153 mg/dL — ABNORMAL HIGH (ref 70–99)

## 2024-04-04 LAB — TSH: TSH: 0.3 u[IU]/mL — ABNORMAL LOW (ref 0.350–4.500)

## 2024-04-04 LAB — MAGNESIUM: Magnesium: 2 mg/dL (ref 1.7–2.4)

## 2024-04-04 MED ORDER — INSULIN ASPART 100 UNIT/ML IJ SOLN
0.0000 [IU] | Freq: Three times a day (TID) | INTRAMUSCULAR | Status: DC
  Administered 2024-04-04: 1 [IU] via SUBCUTANEOUS

## 2024-04-04 MED ORDER — LEVOTHYROXINE SODIUM 112 MCG PO TABS
112.0000 ug | ORAL_TABLET | ORAL | Status: DC
  Administered 2024-04-04: 112 ug via ORAL
  Filled 2024-04-04: qty 1

## 2024-04-04 MED ORDER — DILTIAZEM HCL ER COATED BEADS 180 MG PO CP24
180.0000 mg | ORAL_CAPSULE | Freq: Every day | ORAL | Status: DC
  Administered 2024-04-04: 180 mg via ORAL
  Filled 2024-04-04: qty 1

## 2024-04-04 MED ORDER — AMIODARONE HCL 200 MG PO TABS
200.0000 mg | ORAL_TABLET | Freq: Every day | ORAL | 0 refills | Status: DC
  Filled 2024-04-04: qty 30, 30d supply, fill #0

## 2024-04-04 MED ORDER — PRAVASTATIN SODIUM 40 MG PO TABS: 40.0000 mg | ORAL_TABLET | Freq: Every day | ORAL | Status: DC

## 2024-04-04 MED ORDER — ROPINIROLE HCL 1 MG PO TABS: 0.5000 mg | ORAL_TABLET | Freq: Every day | ORAL | Status: DC

## 2024-04-04 MED ORDER — LEVOTHYROXINE SODIUM 112 MCG PO TABS
168.0000 ug | ORAL_TABLET | ORAL | Status: DC
  Filled 2024-04-04: qty 2

## 2024-04-04 MED ORDER — METOPROLOL SUCCINATE ER 25 MG PO TB24: 50.0000 mg | ORAL_TABLET | Freq: Every day | ORAL | Status: DC

## 2024-04-04 MED ORDER — LEVOTHYROXINE SODIUM 112 MCG PO TABS: 112.0000 ug | ORAL_TABLET | Freq: Every day | ORAL | Status: DC

## 2024-04-04 MED ORDER — DILTIAZEM HCL ER COATED BEADS 240 MG PO CP24
240.0000 mg | ORAL_CAPSULE | Freq: Every day | ORAL | 0 refills | Status: DC
  Filled 2024-04-04: qty 28, 28d supply, fill #0

## 2024-04-04 MED ORDER — LOSARTAN POTASSIUM 50 MG PO TABS
25.0000 mg | ORAL_TABLET | Freq: Every day | ORAL | Status: DC
  Administered 2024-04-04: 25 mg via ORAL
  Filled 2024-04-04: qty 1

## 2024-04-04 MED ORDER — GABAPENTIN 300 MG PO CAPS: 300.0000 mg | ORAL_CAPSULE | Freq: Two times a day (BID) | ORAL | Status: DC | PRN

## 2024-04-04 MED ORDER — AMIODARONE HCL 200 MG PO TABS
200.0000 mg | ORAL_TABLET | Freq: Every day | ORAL | Status: DC
  Administered 2024-04-04: 200 mg via ORAL
  Filled 2024-04-04: qty 1

## 2024-04-04 MED ORDER — ENOXAPARIN SODIUM 40 MG/0.4ML IJ SOSY
40.0000 mg | PREFILLED_SYRINGE | INTRAMUSCULAR | Status: DC
  Filled 2024-04-04: qty 0.4

## 2024-04-04 MED ORDER — EZETIMIBE 10 MG PO TABS
10.0000 mg | ORAL_TABLET | Freq: Every day | ORAL | Status: DC
  Administered 2024-04-04: 10 mg via ORAL
  Filled 2024-04-04: qty 1

## 2024-04-04 NOTE — Telephone Encounter (Signed)
 Spoke with Pt. Made pt aware that previous nurse had made Dr Lavona aware of the change. Pt stated understanding

## 2024-04-04 NOTE — ED Notes (Signed)
 Pt able to ambulate to and from bathroom with a steady gait. Pt back in bed, connected to monitor, with personal belongings and call bell in reach.

## 2024-04-04 NOTE — Telephone Encounter (Signed)
 Pt returning call. Please advise.

## 2024-04-04 NOTE — Telephone Encounter (Signed)
 Pt c/o medication issue:  1. Name of Medication:  Flecainide   2. How are you currently taking this medication (dosage and times per day)?   3. Are you having a reaction (difficulty breathing--STAT)?   4. What is your medication issue?   Patient would like to inform Dr.Hochrein of medication chancges since being admitted in the hospital--patient was taken off Flecainide  due to the thickening of her heart, switched to Amiodarone once daily. Diltiazem  180 MG was also increased to 240 MG. Patient also mentions that they were unable to cardiovert her because of watchman device. Please advise.

## 2024-04-04 NOTE — Telephone Encounter (Signed)
 Left voice message to call back 11/4

## 2024-04-04 NOTE — Progress Notes (Signed)
 Cardiologist:  Hochrein/Camnitz Subjective:  Denies SSCP, palpitations or Dyspnea Converted to NSR   Objective:  Vitals:   04/04/24 0615 04/04/24 0630 04/04/24 0745 04/04/24 0746  BP: (!) 162/57 (!) 146/54 (!) 181/63   Pulse: 61 61 (!) 25   Resp: 19 (!) 22 (!) 22   Temp:  98.1 F (36.7 C)  97.6 F (36.4 C)  TempSrc:  Oral  Oral  SpO2: 94% 96% 93%   Weight:      Height:        Intake/Output from previous day:  Intake/Output Summary (Last 24 hours) at 04/04/2024 9187 Last data filed at 04/04/2024 9471 Gross per 24 hour  Intake 515.07 ml  Output --  Net 515.07 ml    Physical Exam: Overweight female Prior carotid surgery Lungs clear AS murmur Abdomen benign Trace edema  Lab Results: Basic Metabolic Panel: Recent Labs    04/03/24 2231 04/04/24 0538  NA 137 140  K 3.8 3.9  CL 99 103  CO2 25 26  GLUCOSE 251* 167*  BUN 17 15  CREATININE 0.66 0.59  CALCIUM 9.2 8.7*  MG  --  2.0   Liver Function Tests: Recent Labs    04/03/24 2231  AST 20  ALT 16  ALKPHOS 95  BILITOT 1.3*  PROT 7.1  ALBUMIN 3.8   No results for input(s): LIPASE, AMYLASE in the last 72 hours. CBC: Recent Labs    04/03/24 2231 04/04/24 0538  WBC 8.7 7.7  NEUTROABS 7.1 6.2  HGB 12.7 11.0*  HCT 39.4 33.8*  MCV 92.7 92.3  PLT 209 210    Hemoglobin A1C: Recent Labs    04/04/24 0538  HGBA1C 5.5    Thyroid  Function Tests: Recent Labs    04/04/24 0538  TSH 0.300*    Imaging: DG Chest 2 View Result Date: 04/03/2024 EXAM: 2 VIEW(S) XRAY OF THE CHEST 04/03/2024 11:10:53 PM COMPARISON: 05/06/2023 CLINICAL HISTORY: palpitations palpitations FINDINGS: LUNGS AND PLEURA: No focal pulmonary opacity. No pulmonary edema. No pleural effusion. No pneumothorax. HEART AND MEDIASTINUM: Aortic atherosclerosis. Borderline heart size. BONES AND SOFT TISSUES: No acute osseous abnormality. IMPRESSION: 1. No acute cardiopulmonary process identified. 2. Borderline cardiomegaly.  Electronically signed by: Franky Crease MD 04/03/2024 11:40 PM EST RP Workstation: [id number]    Cardiac Studies:  ECG: afib nonspecific ST changes    Telemetry: converted to NSR rates 60-70  Echo:   Medications:    cyanocobalamin   1,000 mcg Intramuscular Q30 days   diltiazem   180 mg Oral Daily   enoxaparin (LOVENOX) injection  40 mg Subcutaneous Q24H   ezetimibe   10 mg Oral Daily   insulin  aspart  0-9 Units Subcutaneous TID WC   levothyroxine   112 mcg Oral QODAY   levothyroxine   168 mcg Oral QODAY   losartan   25 mg Oral Daily   metoprolol  succinate  50 mg Oral QHS   pravastatin   40 mg Oral q1800   rOPINIRole   0.5 mg Oral QHS      Assessment/Plan:   PAF:  converted to NSR increase cardizem  to 240 mg daily. Has had watchman with f/u TEE indicating closure of appendage. Plavix  Rx only Flecainide  d/c due to servere LVH 16 mm septum on MRI. Will start low dose amiodarone and have her f/u with Dr Inocencio to discuss ablation AS: echo 12/07/23 mean gradient 36 peak 57.9 mmHg AVA 0.74 cm2 Has appointment with Dr Wonda 11/18 to discuss TAVR HTN:  increase cardizem  outpatient f/u can also increase losartan  if needed  Discussed with hospitalist ok to d/c home from ER  Emily [name] 04/04/2024, 8:12 AM

## 2024-04-04 NOTE — Consult Note (Signed)
 Cardiology Consultation   Patient ID: Emily [name] MRN: [id number]; DOB: May 07, 1951  Admit date: 04/03/2024 Date of Consult: 04/04/2024  PCP:  Jolinda Norene HERO, DO   [facility] Providers Cardiologist:  Lynwood Schilling, MD  Electrophysiologist:  Soyla Gladis Norton, MD       Patient Profile: Emily [name] is a 73 y.o. female with a hx of paroxysmal atrial fibrillation, obstructive sleep apnea, obesity, hypothyroidism, hypertension, hyperlipidemia, diabetes mellitus type 2, carotid artery stenosis who is being seen 04/04/2024 for the evaluation of atrial fibrillation at the request of the Emergency Department.  History of Present Illness: Emily [name] was in her usual state of health when she developed back pain.  She states whenever she develops back pain, she is usually in atrial fibrillation.  He states her symptoms started last week, however prior to this, she is not sure the last time she was in atrial fibrillation..  She reached out to the overnight team, and was instructed to take her as needed diltiazem .  She took 30 mg diltiazem , and states she continued to have back pain along with elevated heart rates within the 130s.  This prompted ED visit.  Patient states she was diagnosed with atrial fibrillation sometime in 2016.  She has mostly been on flecainide , which she has tolerated well.  He has had approximately 2-3 direct electrical current cardioversions.  There was some discussion regarding catheter ablation, however her atrial burden had improved while on flecainide , so this procedure was deferred.  She has had a history of GI bleeding while on a direct oral acting anticoagulant.  As a result, she underwent a Watchman device at [facility], however she does not remember when this was done.  She also does not realize and her last trans esophageal echocardiogram was related to her watchman.  She has been adherent with her medicines.  She is primarily on  Plavix  monotherapy.  MRI on 03/07/2024 shows normal LV function, asymmetric hypertrophy measuring up to 1.6 cm in the septum (to be due to severe aortic stenosis), normal RV function, moderately enlarged left atrium.     Past Medical History:  Diagnosis Date   Anemia    Arthritis    Atrial fibrillation, rapid (HCC) 10/27/2013   Bilateral carotid artery disease    Complication of anesthesia    difficult intubation in past   Diabetes mellitus without complication (HCC)    type 2   Difficult intubation    states 'lady that did the sleep study told me I have the smallest airway she has ever seen in an adult   Dyslipidemia 06/29/2017   Heart murmur    History of blood transfusion 09/2019   GI bleed - in [place]   Hypertension    Hypothyroid    Obesity    Obstructive sleep apnea    occasional uses CPAP   Peripheral vascular disease    Persistent atrial fibrillation (HCC) 01/16/2020   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 - xrays were negative   Upper GI bleed 09/25/2019    Past Surgical History:  Procedure Laterality Date   CATARACT EXTRACTION Left    CATARACT EXTRACTION W/PHACO Right 12/09/2015   Procedure: CATARACT EXTRACTION PHACO AND INTRAOCULAR LENS PLACEMENT (IOC);  Surgeon: Cherene Mania, MD;  Location: [facility];  Service: Ophthalmology;  Laterality: Right;  CDE:10.48   COLONOSCOPY W/ POLYPECTOMY     cyst on back of neck removed     DILATION AND CURETTAGE OF UTERUS  x 5   ENDARTERECTOMY Left 08/30/2015   Procedure: LEFT CAROYID ENDARTERECTOMY WITH XENOSURE BOVINE PERICARDIUM PATCH ANGIOPLASTY;  Surgeon: Gaile LELON New, MD;  Location: [facility];  Service: Vascular;  Laterality: Left;   TONSILLECTOMY     TRANSCAROTID ARTERY REVASCULARIZATION  Right 03/19/2023   Procedure: Right Transcarotid Artery Revascularization;  Surgeon: New Gaile LELON, MD;  Location: [facility] OR;  Service: Vascular;  Laterality: Right;   ULTRASOUND GUIDANCE FOR VASCULAR ACCESS Left  03/19/2023   Procedure: ULTRASOUND GUIDANCE FOR VASCULAR ACCESS;  Surgeon: New Gaile LELON, MD;  Location: [facility];  Service: Vascular;  Laterality: Left;   UPPER GI ENDOSCOPY     x several     Home Medications:  Prior to Admission medications   Medication Sig Start Date End Date Taking? Authorizing Provider  Accu-Chek Softclix Lancets lancets Test BS daily Dx E11.69 03/28/24   Jolinda Norene HERO, DO  amoxicillin -clavulanate (AUGMENTIN ) 875-125 MG tablet Take 1 tablet by mouth 2 (two) times daily. 02/24/24   Severa Rock HERO, FNP  bisacodyl  (DULCOLAX) 5 MG EC tablet Take 5 mg by mouth daily as needed (constipation).    [provider]  clopidogrel  (PLAVIX ) 75 MG tablet TAKE 1 TABLET BY MOUTH EVERY DAY 08/02/23   Bethanie Cough, PA-C  diltiazem  (CARDIZEM  CD) 180 MG 24 hr capsule TAKE 1 CAPSULE BY MOUTH EVERY DAY 10/28/23   Lavona Agent, MD  diltiazem  (CARDIZEM ) 30 MG tablet Take 1 tablet (30 mg total) by mouth 3 (three) times daily as needed (afib). 01/14/24   Meng, Hao, PA  diphenhydramine -acetaminophen  (TYLENOL  PM) 25-500 MG TABS tablet Take 1 tablet by mouth at bedtime as needed (Pain).    [provider]  ezetimibe  (ZETIA ) 10 MG tablet TAKE 1 TABLET BY MOUTH EVERY DAY 01/06/24   Lavona Agent, MD  flecainide  (TAMBOCOR ) 100 MG tablet TAKE 1 TABLET BY MOUTH TWICE A DAY 10/28/23   Lavona Agent, MD  furosemide  (LASIX ) 20 MG tablet Take 1 tablet (20 mg total) by mouth daily as needed for fluid. 05/18/23 03/07/26  Jolinda Norene HERO, DO  gabapentin  (NEURONTIN ) 300 MG capsule TAKE 1 CAPSULE (300 MG TOTAL) BY MOUTH 2 (TWO) TIMES DAILY AS NEEDED (NEUROPATHY RIGHT LEG). 01/03/24   Jolinda Norene M, DO  glucose blood (ACCU-CHEK GUIDE TEST) test strip Test BS daily Dx E11.69 02/23/24   Jolinda Norene HERO, DO  levonorgestrel (MIRENA) 20 MCG/DAY IUD 1 each by Intrauterine route once.    [provider]  levothyroxine  (SYNTHROID ) 112 MCG tablet TAKE 1 TABLET BY MOUTH EVERY OTHER  DAY, ALTERNATING WITH 1 & 1/2 TABLETS EVERY OTHER DAY 01/03/24   Jolinda Norene M, DO  linaclotide  (LINZESS ) 290 MCG CAPS capsule Take 1 capsule (290 mcg total) by mouth daily before breakfast. 03/14/24   Jolinda Norene HERO, DO  losartan  (COZAAR ) 25 MG tablet TAKE 1 TABLET (25 MG TOTAL) BY MOUTH DAILY. 01/06/24   Lavona Agent, MD  lovastatin  (MEVACOR ) 40 MG tablet TAKE 1 TABLET BY MOUTH EVERY DAY IN THE EVENING 02/23/24   Lavona Agent, MD  metFORMIN  (GLUCOPHAGE ) 1000 MG tablet Take 1 tablet (1,000 mg total) by mouth 2 (two) times daily with a meal. Patient taking differently: Take 500 mg by mouth 2 (two) times daily with a meal. 03/02/23   Jolinda Norene M, DO  metoprolol  succinate (TOPROL -XL) 50 MG 24 hr tablet Take 1 tablet (50 mg total) by mouth at bedtime. Hold if systolic BP  less than 100 or HR less than 50 bpm 06/03/23  West, Katlyn D, NP  polyethylene glycol (MIRALAX  / GLYCOLAX ) 17 g packet Take 17 g by mouth daily as needed for moderate constipation.    [provider]  psyllium (METAMUCIL) 58.6 % packet Take 1 packet by mouth daily as needed (Constipation).    [provider]  rOPINIRole  (REQUIP ) 0.5 MG tablet Take 1 tablet (0.5 mg total) by mouth at bedtime. 03/02/23   Jolinda Norene HERO, DO  Semaglutide ,0.25 or 0.5MG /DOS, 2 MG/3ML SOPN Inject 0.5 mg into the skin once a week. 06/09/22   Jolinda Norene HERO, DO  triamcinolone  cream (KENALOG ) 0.1 % Apply 1 Application topically 2 (two) times daily. X10 days as needed for rash 07/13/23   Jolinda Norene M, DO    Scheduled Meds:  cyanocobalamin   1,000 mcg Intramuscular Q30 days   Continuous Infusions:  diltiazem  (CARDIZEM ) infusion 10 mg/hr (04/04/24 0138)   PRN Meds:   Allergies:    Allergies  Allergen Reactions   Jardiance  [Empagliflozin ] Other (See Comments)    RECURRENT VAGINITIS   Quinapril Hcl Cough   Statins Other (See Comments)    Not all Statins but some cause cough and pain in legs.   Tape Other  (See Comments)    Redness, please use paper tape    Social History:   Social History   Socioeconomic History   Marital status: Single    Spouse name: Not on file   Number of children: Not on file   Years of education: Not on file   Highest education level: Not on file  Occupational History   Not on file  Tobacco Use   Smoking status: Never    Passive exposure: Never   Smokeless tobacco: Never  Vaping Use   Vaping status: Never Used  Substance and Sexual Activity   Alcohol use: No    Alcohol/week: 0.0 standard drinks of alcohol   Drug use: Never   Sexual activity: Not Currently    Birth control/protection: Post-menopausal  Other Topics Concern   Not on file  Social History Narrative   Not on file   Social Drivers of Health   Financial Resource Strain: Low Risk  (04/21/2022)   Overall Financial Resource Strain (CARDIA)    Difficulty of Paying Living Expenses: Not hard at all  Food Insecurity: No Food Insecurity (03/22/2023)   Hunger Vital Sign    Worried About Running Out of Food in the Last Year: Never true    Ran Out of Food in the Last Year: Never true  Transportation Needs: No Transportation Needs (03/22/2023)   PRAPARE - Administrator, Civil Service (Medical): No    Lack of Transportation (Non-Medical): No  Physical Activity: Inactive (04/21/2022)   Exercise Vital Sign    Days of Exercise per Week: 0 days    Minutes of Exercise per Session: 0 min  Stress: No Stress Concern Present (04/21/2022)   [employer] of Occupational Health - Occupational Stress Questionnaire    Feeling of Stress : Not at all  Social Connections: Moderately Integrated (04/21/2022)   Social Connection and Isolation Panel    Frequency of Communication with Friends and Family: More than three times a week    Frequency of Social Gatherings with Friends and Family: Twice a week    Attends Religious Services: More than 4 times per year    Active Member of [employer] or  Organizations: Yes    Attends Banker Meetings: More than 4 times per year    Marital Status:  Never married  Intimate Partner Violence: Not At Risk (03/19/2023)   Humiliation, Afraid, Rape, and Kick questionnaire    Fear of Current or Ex-Partner: No    Emotionally Abused: No    Physically Abused: No    Sexually Abused: No    Family History:    Family History  Problem Relation Age of Onset   COPD Father    Heart failure Father    Heart disease Father    Emphysema Father    Arrhythmia Sister    Arrhythmia Sister    Arrhythmia Sister        had PPM also   Cancer Sister      ROS:  Please see the history of present illness.   All other ROS reviewed and negative.     Physical Exam/Data: Vitals:   04/04/24 0100 04/04/24 0115 04/04/24 0145 04/04/24 0154  BP:  119/76 (!) 142/70   Pulse: (!) 118 (!) 106 97   Resp: (!) 23 (!) 21 (!) 23   Temp:    97.9 F (36.6 C)  TempSrc:    Oral  SpO2: 99% 97% 96%   Weight:      Height:        Intake/Output Summary (Last 24 hours) at 04/04/2024 0207 Last data filed at 04/04/2024 0127 Gross per 24 hour  Intake 500 ml  Output --  Net 500 ml      04/03/2024   10:25 PM 03/24/2024    8:26 AM 03/14/2024   11:10 AM  Last 3 Weights  Weight (lbs) 200 lb 206 lb 203 lb 12.8 oz  Weight (kg) 90.719 kg 93.441 kg 92.443 kg     Body mass index is 35.43 kg/m.  General:  Well nourished, well developed, in no acute distress HEENT: normal Neck: no JVD Vascular: No carotid bruits; Distal pulses 2+ bilaterally Cardiac:  irregular rate, rhythm,  no murmur  Lungs:  clear to auscultation bilaterally, no wheezing, rhonchi or rales  Abd: soft, nontender, no hepatomegaly  Ext: no edema Musculoskeletal:  No deformities, BUE and BLE strength normal and equal Skin: warm and dry  Neuro:  CNs 2-12 intact, no focal abnormalities noted Psych:  Normal affect   EKG:  The EKG was personally reviewed and demonstrates:   Telemetry:  Telemetry was  personally reviewed and demonstrates:  AF  Relevant CV Studies:  reviewed  Laboratory Data: High Sensitivity Troponin:   Recent Labs  Lab 04/03/24 2231 04/04/24 0105  TROPONINIHS 13 23*     Chemistry Recent Labs  Lab 04/03/24 2231  NA 137  K 3.8  CL 99  CO2 25  GLUCOSE 251*  BUN 17  CREATININE 0.66  CALCIUM 9.2  GFRNONAA >60  ANIONGAP 13    Recent Labs  Lab 04/03/24 2231  PROT 7.1  ALBUMIN 3.8  AST 20  ALT 16  ALKPHOS 95  BILITOT 1.3*   Lipids No results for input(s): CHOL, TRIG, HDL, LABVLDL, LDLCALC, CHOLHDL in the last 168 hours.  Hematology Recent Labs  Lab 04/03/24 2231  WBC 8.7  RBC 4.25  HGB 12.7  HCT 39.4  MCV 92.7  MCH 29.9  MCHC 32.2  RDW 15.4  PLT 209   Thyroid  No results for input(s): TSH, FREET4 in the last 168 hours.  BNP Recent Labs  Lab 04/03/24 2231  BNP 558.5*    DDimer No results for input(s): DDIMER in the last 168 hours.  Radiology/Studies:  DG Chest 2 View Result Date: 04/03/2024 EXAM: 2 VIEW(S)  XRAY OF THE CHEST 04/03/2024 11:10:53 PM COMPARISON: 05/06/2023 CLINICAL HISTORY: palpitations palpitations FINDINGS: LUNGS AND PLEURA: No focal pulmonary opacity. No pulmonary edema. No pleural effusion. No pneumothorax. HEART AND MEDIASTINUM: Aortic atherosclerosis. Borderline heart size. BONES AND SOFT TISSUES: No acute osseous abnormality. IMPRESSION: 1. No acute cardiopulmonary process identified. 2. Borderline cardiomegaly. Electronically signed by: Franky Crease MD 04/03/2024 11:40 PM EST RP Workstation: [id number]     Assessment and Plan:  Emily [name] is a 73 y.o. female with a hx of paroxysmal atrial fibrillation, obstructive sleep apnea, obesity, hypothyroidism, hypertension, hyperlipidemia, diabetes mellitus type 2, carotid artery stenosis who is being seen 04/04/2024 for the evaluation of atrial fibrillation at the request of the Emergency Department.  Patient symptoms have improved while on  diltiazem  drip, however she still remains in atrial fibrillation with ventricular rates in the 110s.  Factors include obesity, age, and asymmetrical LVH.  Her AF burden is unclear.  She is currently wearing a Holter monitor, which she reports she is supposed to turn in by next Friday.  For rhythm control, will hold flecainide  for now.  Will defer DCCV given her unknown history regarding her watchman.  For now, we will continue diltiazem  and Plavix .  Continue diltiazem  drip (goal heart rate less than 120 bpm) Continue plavix  and other home medications Hold flecainide  3. Goal K>4, Mg>2  Risk Assessment/Risk Scores:         CHA2DS2-VASc Score = 4   This indicates a 4.8% annual risk of stroke. The patient's score is based upon: CHF History: 0 HTN History: 1 Diabetes History: 0 Stroke History: 0 Vascular Disease History: 1 Age Score: 1 Gender Score: 1        For questions or updates, please contact [facility] Please consult [email or web address] for contact info under      Signed, Moroni Nester A Amyra Vantuyl, MD  04/04/2024 2:07 AM

## 2024-04-04 NOTE — H&P (Signed)
 History and Physical    Emily [name] FMW:[id number] DOB: 1950-09-17 DOA: 04/03/2024  Patient coming from: Home.  Chief Complaint: Upper back pain.  HPI: Emily [name] is a 73 y.o. female with history of paroxysmal atrial fibrillation, severe aortic stenosis, diabetes mellitus type 2, hypertension, hyperlipidemia, sleep apnea was brought to the ER after patient started experiencing upper back pain last evening.  Patient states she was about to go to the bed when she started experiencing the upper back pain.  Denies any associated shortness of breath.  When she checked her heart rate with her friend it was elevated and she called EMS.  Patient has been recently experiencing periods of palpitations and had followed up with cardiology on March 24, 2024 when cardiology had placed on Zio patch due to intermittent episodes of tachycardia.  ED Course: In the ER patient was in A-fib with RVR.  Was started on Cardizem  infusion.  On-call cardiology was consulted.  Troponins were 13 and 23.  BNP was 558.  Chest x-ray unremarkable.  Hemoglobin 12.7.  Review of Systems: As per HPI, rest all negative.   Past Medical History:  Diagnosis Date   Anemia    Arthritis    Atrial fibrillation, rapid (HCC) 10/27/2013   Bilateral carotid artery disease    Complication of anesthesia    difficult intubation in past   Diabetes mellitus without complication (HCC)    type 2   Difficult intubation    states 'lady that did the sleep study told me I have the smallest airway she has ever seen in an adult   Dyslipidemia 06/29/2017   Heart murmur    History of blood transfusion 09/2019   GI bleed - in [place]   Hypertension    Hypothyroid    Obesity    Obstructive sleep apnea    occasional uses CPAP   Peripheral vascular disease    Persistent atrial fibrillation (HCC) 01/16/2020   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 - xrays were negative   Upper GI bleed 09/25/2019    Past  Surgical History:  Procedure Laterality Date   CATARACT EXTRACTION Left    CATARACT EXTRACTION W/PHACO Right 12/09/2015   Procedure: CATARACT EXTRACTION PHACO AND INTRAOCULAR LENS PLACEMENT (IOC);  Surgeon: Cherene Mania, MD;  Location: [facility];  Service: Ophthalmology;  Laterality: Right;  CDE:10.48   COLONOSCOPY W/ POLYPECTOMY     cyst on back of neck removed     DILATION AND CURETTAGE OF UTERUS     x 5   ENDARTERECTOMY Left 08/30/2015   Procedure: LEFT CAROYID ENDARTERECTOMY WITH XENOSURE BOVINE PERICARDIUM PATCH ANGIOPLASTY;  Surgeon: Gaile LELON New, MD;  Location: [facility] OR;  Service: Vascular;  Laterality: Left;   TONSILLECTOMY     TRANSCAROTID ARTERY REVASCULARIZATION  Right 03/19/2023   Procedure: Right Transcarotid Artery Revascularization;  Surgeon: New Gaile LELON, MD;  Location: [facility] OR;  Service: Vascular;  Laterality: Right;   ULTRASOUND GUIDANCE FOR VASCULAR ACCESS Left 03/19/2023   Procedure: ULTRASOUND GUIDANCE FOR VASCULAR ACCESS;  Surgeon: New Gaile LELON, MD;  Location: [facility] OR;  Service: Vascular;  Laterality: Left;   UPPER GI ENDOSCOPY     x several     reports that she has never smoked. She has never been exposed to tobacco smoke. She has never used smokeless tobacco. She reports that she does not drink alcohol and does not use drugs.  Allergies  Allergen Reactions   Jardiance  [Empagliflozin ] Other (See Comments)  RECURRENT VAGINITIS   Quinapril Hcl Cough   Statins Other (See Comments)    Not all Statins but some cause cough and pain in legs.   Tape Other (See Comments)    Redness, please use paper tape    Family History  Problem Relation Age of Onset   COPD Father    Heart failure Father    Heart disease Father    Emphysema Father    Arrhythmia Sister    Arrhythmia Sister    Arrhythmia Sister        had PPM also   Cancer Sister     Prior to Admission medications   Medication Sig Start Date End Date Taking? Authorizing Provider  Accu-Chek Softclix  Lancets lancets Test BS daily Dx E11.69 03/28/24   Jolinda Norene HERO, DO  amoxicillin -clavulanate (AUGMENTIN ) 875-125 MG tablet Take 1 tablet by mouth 2 (two) times daily. 02/24/24   Severa Rock HERO, FNP  bisacodyl  (DULCOLAX) 5 MG EC tablet Take 5 mg by mouth daily as needed (constipation).    [provider]  clopidogrel  (PLAVIX ) 75 MG tablet TAKE 1 TABLET BY MOUTH EVERY DAY 08/02/23   Bethanie Cough, PA-C  diltiazem  (CARDIZEM  CD) 180 MG 24 hr capsule TAKE 1 CAPSULE BY MOUTH EVERY DAY 10/28/23   Lavona Agent, MD  diltiazem  (CARDIZEM ) 30 MG tablet Take 1 tablet (30 mg total) by mouth 3 (three) times daily as needed (afib). 01/14/24   Meng, Hao, PA  diphenhydramine -acetaminophen  (TYLENOL  PM) 25-500 MG TABS tablet Take 1 tablet by mouth at bedtime as needed (Pain).    [provider]  ezetimibe  (ZETIA ) 10 MG tablet TAKE 1 TABLET BY MOUTH EVERY DAY 01/06/24   Lavona Agent, MD  flecainide  (TAMBOCOR ) 100 MG tablet TAKE 1 TABLET BY MOUTH TWICE A DAY 10/28/23   Lavona Agent, MD  furosemide  (LASIX ) 20 MG tablet Take 1 tablet (20 mg total) by mouth daily as needed for fluid. 05/18/23 03/07/26  Jolinda Norene HERO, DO  gabapentin  (NEURONTIN ) 300 MG capsule TAKE 1 CAPSULE (300 MG TOTAL) BY MOUTH 2 (TWO) TIMES DAILY AS NEEDED (NEUROPATHY RIGHT LEG). 01/03/24   Jolinda Norene M, DO  glucose blood (ACCU-CHEK GUIDE TEST) test strip Test BS daily Dx E11.69 02/23/24   Jolinda Norene HERO, DO  levonorgestrel (MIRENA) 20 MCG/DAY IUD 1 each by Intrauterine route once.    [provider]  levothyroxine  (SYNTHROID ) 112 MCG tablet TAKE 1 TABLET BY MOUTH EVERY OTHER DAY, ALTERNATING WITH 1 & 1/2 TABLETS EVERY OTHER DAY 01/03/24   Jolinda Norene M, DO  linaclotide  (LINZESS ) 290 MCG CAPS capsule Take 1 capsule (290 mcg total) by mouth daily before breakfast. 03/14/24   Jolinda Norene M, DO  losartan  (COZAAR ) 25 MG tablet TAKE 1 TABLET (25 MG TOTAL) BY MOUTH DAILY. 01/06/24   Lavona Agent, MD   lovastatin  (MEVACOR ) 40 MG tablet TAKE 1 TABLET BY MOUTH EVERY DAY IN THE EVENING 02/23/24   Lavona Agent, MD  metFORMIN  (GLUCOPHAGE ) 1000 MG tablet Take 1 tablet (1,000 mg total) by mouth 2 (two) times daily with a meal. Patient taking differently: Take 500 mg by mouth 2 (two) times daily with a meal. 03/02/23   Jolinda Norene M, DO  metoprolol  succinate (TOPROL -XL) 50 MG 24 hr tablet Take 1 tablet (50 mg total) by mouth at bedtime. Hold if systolic BP  less than 100 or HR less than 50 bpm 06/03/23   West, Katlyn D, NP  polyethylene glycol (MIRALAX  / GLYCOLAX ) 17 g packet Take  17 g by mouth daily as needed for moderate constipation.    [provider]  psyllium (METAMUCIL) 58.6 % packet Take 1 packet by mouth daily as needed (Constipation).    [provider]  rOPINIRole  (REQUIP ) 0.5 MG tablet Take 1 tablet (0.5 mg total) by mouth at bedtime. 03/02/23   Jolinda Norene HERO, DO  Semaglutide ,0.25 or 0.5MG /DOS, 2 MG/3ML SOPN Inject 0.5 mg into the skin once a week. 06/09/22   Jolinda Norene HERO, DO  triamcinolone  cream (KENALOG ) 0.1 % Apply 1 Application topically 2 (two) times daily. X10 days as needed for rash 07/13/23   Jolinda Norene HERO, DO    Physical Exam: Constitutional: Moderately built and nourished. Vitals:   04/04/24 0100 04/04/24 0115 04/04/24 0145 04/04/24 0154  BP:  119/76 (!) 142/70   Pulse: (!) 118 (!) 106 97   Resp: (!) 23 (!) 21 (!) 23   Temp:    97.9 F (36.6 C)  TempSrc:    Oral  SpO2: 99% 97% 96%   Weight:      Height:       Eyes: Anicteric no pallor. ENMT: No discharge from the ears eyes nose or mouth. Neck: No mass felt.  No neck rigidity. Respiratory: No rhonchi or crepitations. Cardiovascular: S1-S2 heard. Abdomen: Soft nontender bowel sound present. Musculoskeletal: No edema. Skin: No rash. Neurologic: Alert awake oriented to time place and person.  Moves all extremities. Psychiatric: Appears normal.  Normal affect.   Labs on  Admission: I have personally reviewed following labs and imaging studies  CBC: Recent Labs  Lab 04/03/24 2231  WBC 8.7  NEUTROABS 7.1  HGB 12.7  HCT 39.4  MCV 92.7  PLT 209   Basic Metabolic Panel: Recent Labs  Lab 04/03/24 2231  NA 137  K 3.8  CL 99  CO2 25  GLUCOSE 251*  BUN 17  CREATININE 0.66  CALCIUM 9.2   GFR: Estimated Creatinine Clearance: 65.9 mL/min (by C-G formula based on SCr of 0.66 mg/dL). Liver Function Tests: Recent Labs  Lab 04/03/24 2231  AST 20  ALT 16  ALKPHOS 95  BILITOT 1.3*  PROT 7.1  ALBUMIN 3.8   No results for input(s): LIPASE, AMYLASE in the last 168 hours. No results for input(s): AMMONIA in the last 168 hours. Coagulation Profile: No results for input(s): INR, PROTIME in the last 168 hours. Cardiac Enzymes: No results for input(s): CKTOTAL, CKMB, CKMBINDEX, TROPONINI in the last 168 hours. BNP (last 3 results) No results for input(s): PROBNP in the last 8760 hours. HbA1C: No results for input(s): HGBA1C in the last 72 hours. CBG: No results for input(s): GLUCAP in the last 168 hours. Lipid Profile: No results for input(s): CHOL, HDL, LDLCALC, TRIG, CHOLHDL, LDLDIRECT in the last 72 hours. Thyroid  Function Tests: No results for input(s): TSH, T4TOTAL, FREET4, T3FREE, THYROIDAB in the last 72 hours. Anemia Panel: No results for input(s): VITAMINB12, FOLATE, FERRITIN, TIBC, IRON , RETICCTPCT in the last 72 hours. Urine analysis:    Component Value Date/Time   COLORURINE YELLOW 03/16/2023 1220   APPEARANCEUR HAZY (A) 03/16/2023 1220   APPEARANCEUR Clear 03/02/2023 1047   LABSPEC 1.010 03/16/2023 1220   PHURINE 6.0 03/16/2023 1220   GLUCOSEU NEGATIVE 03/16/2023 1220   HGBUR NEGATIVE 03/16/2023 1220   BILIRUBINUR NEGATIVE 03/16/2023 1220   BILIRUBINUR Negative 03/02/2023 1047   KETONESUR NEGATIVE 03/16/2023 1220   PROTEINUR NEGATIVE 03/16/2023 1220   UROBILINOGEN 0.2  05/26/2014 1611   NITRITE NEGATIVE 03/16/2023 1220   LEUKOCYTESUR  NEGATIVE 03/16/2023 1220   Sepsis Labs: @LABRCNTIP (procalcitonin:4,lacticidven:4) )No results found for this or any previous visit (from the past 240 hours).   Radiological Exams on Admission: DG Chest 2 View Result Date: 04/03/2024 EXAM: 2 VIEW(S) XRAY OF THE CHEST 04/03/2024 11:10:53 PM COMPARISON: 05/06/2023 CLINICAL HISTORY: palpitations palpitations FINDINGS: LUNGS AND PLEURA: No focal pulmonary opacity. No pulmonary edema. No pleural effusion. No pneumothorax. HEART AND MEDIASTINUM: Aortic atherosclerosis. Borderline heart size. BONES AND SOFT TISSUES: No acute osseous abnormality. IMPRESSION: 1. No acute cardiopulmonary process identified. 2. Borderline cardiomegaly. Electronically signed by: Franky Crease MD 04/03/2024 11:40 PM EST RP Workstation: [id number]    EKG: Independently reviewed.  A-fib with RVR.  Assessment/Plan Active Problems:   Hypertension associated with diabetes (HCC)   Hypothyroidism due to acquired atrophy of thyroid    Diabetes mellitus treated with injections of non-insulin  medication (HCC)   OSA on CPAP   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 device   Aortic stenosis, moderate   Atrial fibrillation with RVR (HCC)    A-fib with RVR -    appreciate cardiology consult.  At the time of my exam patient has converted to sinus rhythm.  Will hold Cardizem  infusion and keep patient on home dose of Cardizem  p.o. and metoprolol .  Per cardiology to hold flecainide .  Not on anticoagulation due to prior GI bleed and has Watchman device. Moderate aortic stenosis has follow-up with Dr. Montey cardiologist. Hypertension on ARB beta-blockers and Cardizem . Hyperlipidemia on statins. Diabetes mellitus type II takes semaglutide .  Will keep patient on sliding scale coverage.  Last hemoglobin A1c was 5.8 about a year ago. Sleep apnea on CPAP.  Patient states she has been regularly using it over the last 1  week. Restless leg syndrome on Requip . Hypothyroidism on Synthroid  check TSH.   DVT prophylaxis: Lovenox. Code Status: Full code. Family Communication: Discussed with patient. Disposition Plan: Monitored bed. Consults called: Cardiology. Admission status: Observation.

## 2024-04-04 NOTE — Discharge Summary (Signed)
 Physician Discharge Summary   Patient: Emily [name] MRN: [id number] DOB: [birth year]  Admit date:     04/03/2024  Discharge date: 04/04/24  Discharge Physician: Carliss LELON Canales   PCP: Jolinda Norene HERO, DO   Recommendations at discharge:    Pt to be discharged home.   If you experience worsening fever, chills, chest pain, shortness of breath, or other concerning symptoms, please call your PCP or go to the emergency department immediately.  Discharge Diagnoses: Active Problems:   Hypertension associated with diabetes (HCC)   Hypothyroidism due to acquired atrophy of thyroid    Diabetes mellitus treated with injections of non-insulin  medication (HCC)   OSA on CPAP   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 device   Aortic stenosis, moderate   Atrial fibrillation with RVR (HCC)   PAF (paroxysmal atrial fibrillation) (HCC)  Resolved Problems:   * No resolved hospital problems. *   Hospital Course:  Emily [name] is a 73 y.o. female with history of paroxysmal atrial fibrillation, severe aortic stenosis, diabetes mellitus type 2, hypertension, hyperlipidemia, sleep apnea was brought to the ER after patient started experiencing upper back pain last evening.  Patient states she was about to go to the bed when she started experiencing the upper back pain.  Denies any associated shortness of breath.  When she checked her heart rate with her friend it was elevated and she called EMS.  Patient has been recently experiencing periods of palpitations and had followed up with cardiology on March 24, 2024 when cardiology had placed on Zio patch due to intermittent episodes of tachycardia.   ED Course: In the ER patient was in A-fib with RVR.  Was started on Cardizem  infusion.  On-call cardiology was consulted.  Troponins were 13 and 23.  BNP was 558.  Chest x-ray unremarkable.  Hemoglobin 12.7.  Repeat evaluation performed greater than 8 hours after initial  presentation.  Assessment and Plan:  Atrial fibrillation with RVR - Initial presentation of chest pain with rates in the 130s-140s.  Had showed spontaneous conversion to NSR.  Evaluated by cardiology.  Per recommendations, will increase Cardizem  to 240 mg daily.  No need for anticoagulation as patient has had Watchman with follow-up TEE indicating closure of appendage.  Continue Plavix  only.  Patient to discontinue flecainide  due to severe LVH.  Initiating on once daily amiodarone with instructions to follow-up with Dr. Inocencio to discuss ablation in clinic.  Okay to be discharged home as symptoms have resolved, no longer on IV medications treatment.  Moderate-severe aortic stenosis - Has appointment with Dr. Wonda 11/18 to discuss TAVR.  Hypertension - Increased dose of Cardizem  as above.  Can follow-up with cardiology in outpatient setting.  Consider increasing losartan .  Diabetes mellitus - Resume home regimen.  Sleep apnea - Continue CPAP at night.  hypothyroidism - TSH moderately low.  Patient is interesting levothyroxine  regimen.  Would consider discussing with PCP on possibly reducing dose.  Consultants: Cardiology Procedures performed: None Disposition: Home Diet recommendation:  Discharge Diet Orders (From admission, onward)     Start     Ordered   04/04/24 0000  Diet - low sodium heart healthy        04/04/24 1004           Cardiac and Carb modified diet  DISCHARGE MEDICATION: Allergies as of 04/04/2024       Reactions   Jardiance  [empagliflozin ] Other (See Comments)   RECURRENT VAGINITIS   Quinapril Hcl Cough   Statins Other (See  Comments)   Not all Statins but some cause cough and pain in legs.   Tape Other (See Comments)   Redness, please use paper tape        Medication List     STOP taking these medications    flecainide  100 MG tablet Commonly known as: TAMBOCOR        TAKE these medications    amiodarone 200 MG tablet Commonly known  as: PACERONE Take 1 tablet (200 mg total) by mouth daily.   bisacodyl  5 MG EC tablet Commonly known as: DULCOLAX Take 5 mg by mouth daily as needed (constipation).   clopidogrel  75 MG tablet Commonly known as: PLAVIX  TAKE 1 TABLET BY MOUTH EVERY DAY   diltiazem  240 MG 24 hr capsule Commonly known as: Cartia  XT Take 1 capsule (240 mg total) by mouth daily. What changed:  medication strength how much to take   diltiazem  30 MG tablet Commonly known as: CARDIZEM  Take 1 tablet (30 mg total) by mouth 3 (three) times daily as needed (afib).   diphenhydramine -acetaminophen  25-500 MG Tabs tablet Commonly known as: TYLENOL  PM Take 1 tablet by mouth at bedtime as needed (Pain).   ezetimibe  10 MG tablet Commonly known as: ZETIA  TAKE 1 TABLET BY MOUTH EVERY DAY   furosemide  20 MG tablet Commonly known as: LASIX  Take 1 tablet (20 mg total) by mouth daily as needed for fluid.   gabapentin  300 MG capsule Commonly known as: NEURONTIN  TAKE 1 CAPSULE (300 MG TOTAL) BY MOUTH 2 (TWO) TIMES DAILY AS NEEDED (NEUROPATHY RIGHT LEG).   levonorgestrel 20 MCG/DAY Iud Commonly known as: MIRENA 1 each by Intrauterine route once.   levothyroxine  112 MCG tablet Commonly known as: SYNTHROID  TAKE 1 TABLET BY MOUTH EVERY OTHER DAY, ALTERNATING WITH 1 & 1/2 TABLETS EVERY OTHER DAY   linaclotide  290 MCG Caps capsule Commonly known as: Linzess  Take 1 capsule (290 mcg total) by mouth daily before breakfast. What changed:  when to take this reasons to take this   losartan  25 MG tablet Commonly known as: COZAAR  TAKE 1 TABLET (25 MG TOTAL) BY MOUTH DAILY.   lovastatin  40 MG tablet Commonly known as: MEVACOR  TAKE 1 TABLET BY MOUTH EVERY DAY IN THE EVENING   metFORMIN  1000 MG tablet Commonly known as: GLUCOPHAGE  Take 1 tablet (1,000 mg total) by mouth 2 (two) times daily with a meal. What changed: how much to take   metoprolol  succinate 50 MG 24 hr tablet Commonly known as: TOPROL -XL Take 1  tablet (50 mg total) by mouth at bedtime. Hold if systolic BP  less than 100 or HR less than 50 bpm   polyethylene glycol 17 g packet Commonly known as: MIRALAX  / GLYCOLAX  Take 17 g by mouth daily as needed for moderate constipation.   psyllium 58.6 % packet Commonly known as: METAMUCIL Take 1 packet by mouth daily as needed (Constipation).   rOPINIRole  0.5 MG tablet Commonly known as: REQUIP  Take 1 tablet (0.5 mg total) by mouth at bedtime.   Semaglutide (0.25 or 0.5MG /DOS) 2 MG/3ML Sopn Inject 0.5 mg into the skin once a week. What changed: how much to take   triamcinolone  cream 0.1 % Commonly known as: KENALOG  Apply 1 Application topically 2 (two) times daily. X10 days as needed for rash         Discharge Exam: Filed Weights   04/03/24 2225  Weight: 90.7 kg    GENERAL:  Alert, pleasant, no acute distress  HEENT:  EOMI CARDIOVASCULAR:  RRR, no murmurs  appreciated RESPIRATORY:  Clear to auscultation, no wheezing, rales, or rhonchi GASTROINTESTINAL:  Soft, nontender, nondistended EXTREMITIES:  No LE edema bilaterally NEURO:  No new focal deficits appreciated SKIN:  No rashes noted PSYCH:  Appropriate mood and affect     Condition at discharge: improving  The results of significant diagnostics from this hospitalization (including imaging, microbiology, ancillary and laboratory) are listed below for reference.   Imaging Studies: DG Chest 2 View Result Date: 04/03/2024 EXAM: 2 VIEW(S) XRAY OF THE CHEST 04/03/2024 11:10:53 PM COMPARISON: 05/06/2023 CLINICAL HISTORY: palpitations palpitations FINDINGS: LUNGS AND PLEURA: No focal pulmonary opacity. No pulmonary edema. No pleural effusion. No pneumothorax. HEART AND MEDIASTINUM: Aortic atherosclerosis. Borderline heart size. BONES AND SOFT TISSUES: No acute osseous abnormality. IMPRESSION: 1. No acute cardiopulmonary process identified. 2. Borderline cardiomegaly. Electronically signed by: Franky Crease MD 04/03/2024 11:40 PM  EST RP Workstation: [id number]   MR CARDIAC MORPHOLOGY W WO CONTRAST Result Date: 03/08/2024 CLINICAL DATA:  50F with severe LVH on echo EXAM: CARDIAC MRI TECHNIQUE: The patient was scanned on a 1.5 Tesla Siemens magnet. A dedicated cardiac coil was used. Functional imaging was done using Fiesta sequences. 2,3, and 4 chamber views were done to assess for RWMA's. Modified Simpson's rule using a short axis stack was used to calculate an ejection fraction on a dedicated work [employer]. The patient received 10 cc of Gadavist. After 10 minutes inversion recovery sequences were used to assess for infiltration and scar tissue. Phase contrast velocity mapping was performed above the aortic and pulmonic valves CONTRAST:  10 cc  of Gadavist FINDINGS: Left ventricle: -Asymmetric hypertrophy measuring up to 16mm in septum (11mm in posterior wall). -Normal size -Normal systolic function -Normal ECV (27%) -RV insertion site LGE LV EF:  62% (Normal 52-79%) Absolute volumes: LV EDV: (Normal 78-167 mL) LV ESV: 66mL (Normal 21-64 mL) LV SV: (Normal 52-114 mL) CO: 4.6L/min (Normal 2.7-6.3 L/min) Indexed volumes: LV EDV: 33mL/sq-m (Normal 50-96 mL/sq-m) LV ESV: 51mL/sq-m (Normal 10-40 mL/sq-m) LV SV: 9mL/sq-m (Normal 33-64 mL/sq-m) CI: 2.3L/min/sq-m (Normal 1.9-3.9 L/min/sq-m) Right ventricle: Normal size and systolic function RV EF: 55% (Normal 52-80%) Absolute volumes: RV EDV: (Normal 79-175 mL) RV ESV: 73mL (Normal 13-75 mL) RV SV: 89mL (Normal 56-110 mL) CO: 3.9L/min (Normal 2.7-6 L/min) Indexed volumes: RV EDV: 61mL/sq-m (Normal 51-97 mL/sq-m) RV ESV: 21mL/sq-m (Normal 9-42 mL/sq-m) RV SV: 49mL/sq-m (Normal 35-61 mL/sq-m) CI: 1.9L/min/sq-m (Normal 1.8-3.8 L/min/sq-m) Left atrium: Moderate enlargement Right atrium: Normal size Mitral valve: Mild regurgitation Aortic valve: Mild regurgitation.  Visually appears severe stenosis Tricuspid valve: Mild regurgitation Pulmonic valve: No  regurgitation Aorta: Normal proximal ascending aorta Pulmonary artery: Dilated main pulmonary artery measuring 30mm Pericardium: Normal IMPRESSION: 1. Asymmetric LV hypertrophy measuring up to 16mm in septum (11mm in posterior wall). While this meets criteria for hypertrophic cardiomyopathy, aortic stenosis can also cause asymmetric LVH. Visually AS appears severe, and on review of recent echo appears severe AS. Suspect LVH is due to severe aortic stenosis 2.  Normal LV size and systolic function (EF 62%) 3.  Normal RV size and systolic function (55%) 4. RV insertion site late gadolinium enhancement, which is a nonspecific scar pattern often seen in setting of elevated pulmonary pressures 5.  Dilated main pulmonary artery measuring 30mm Electronically Signed   By: Lonni Nanas M.D.   On: 03/08/2024 12:15   MR CARDIAC VELOCITY FLOW MAP Result Date: 03/08/2024 CLINICAL DATA:  50F with severe LVH on echo EXAM: CARDIAC MRI TECHNIQUE: The patient  was scanned on a 1.5 Tesla Siemens magnet. A dedicated cardiac coil was used. Functional imaging was done using Fiesta sequences. 2,3, and 4 chamber views were done to assess for RWMA's. Modified Simpson's rule using a short axis stack was used to calculate an ejection fraction on a dedicated work [employer]. The patient received 10 cc of Gadavist. After 10 minutes inversion recovery sequences were used to assess for infiltration and scar tissue. Phase contrast velocity mapping was performed above the aortic and pulmonic valves CONTRAST:  10 cc  of Gadavist FINDINGS: Left ventricle: -Asymmetric hypertrophy measuring up to 16mm in septum (11mm in posterior wall). -Normal size -Normal systolic function -Normal ECV (27%) -RV insertion site LGE LV EF:  62% (Normal 52-79%) Absolute volumes: LV EDV: (Normal 78-167 mL) LV ESV: 66mL (Normal 21-64 mL) LV SV: (Normal 52-114 mL) CO: 4.6L/min (Normal 2.7-6.3 L/min) Indexed volumes: LV EDV:  84mL/sq-m (Normal 50-96 mL/sq-m) LV ESV: 36mL/sq-m (Normal 10-40 mL/sq-m) LV SV: 57mL/sq-m (Normal 33-64 mL/sq-m) CI: 2.3L/min/sq-m (Normal 1.9-3.9 L/min/sq-m) Right ventricle: Normal size and systolic function RV EF: 55% (Normal 52-80%) Absolute volumes: RV EDV: (Normal 79-175 mL) RV ESV: 73mL (Normal 13-75 mL) RV SV: 89mL (Normal 56-110 mL) CO: 3.9L/min (Normal 2.7-6 L/min) Indexed volumes: RV EDV: 52mL/sq-m (Normal 51-97 mL/sq-m) RV ESV: 56mL/sq-m (Normal 9-42 mL/sq-m) RV SV: 78mL/sq-m (Normal 35-61 mL/sq-m) CI: 1.9L/min/sq-m (Normal 1.8-3.8 L/min/sq-m) Left atrium: Moderate enlargement Right atrium: Normal size Mitral valve: Mild regurgitation Aortic valve: Mild regurgitation.  Visually appears severe stenosis Tricuspid valve: Mild regurgitation Pulmonic valve: No regurgitation Aorta: Normal proximal ascending aorta Pulmonary artery: Dilated main pulmonary artery measuring 30mm Pericardium: Normal IMPRESSION: 1. Asymmetric LV hypertrophy measuring up to 16mm in septum (11mm in posterior wall). While this meets criteria for hypertrophic cardiomyopathy, aortic stenosis can also cause asymmetric LVH. Visually AS appears severe, and on review of recent echo appears severe AS. Suspect LVH is due to severe aortic stenosis 2.  Normal LV size and systolic function (EF 62%) 3.  Normal RV size and systolic function (55%) 4. RV insertion site late gadolinium enhancement, which is a nonspecific scar pattern often seen in setting of elevated pulmonary pressures 5.  Dilated main pulmonary artery measuring 30mm Electronically Signed   By: Lonni Nanas M.D.   On: 03/08/2024 12:15   MR CARDIAC VELOCITY FLOW MAP Result Date: 03/08/2024 CLINICAL DATA:  50F with severe LVH on echo EXAM: CARDIAC MRI TECHNIQUE: The patient was scanned on a 1.5 Tesla Siemens magnet. A dedicated cardiac coil was used. Functional imaging was done using Fiesta sequences. 2,3, and 4 chamber views were done to assess for RWMA's. Modified  Simpson's rule using a short axis stack was used to calculate an ejection fraction on a dedicated work [employer]. The patient received 10 cc of Gadavist. After 10 minutes inversion recovery sequences were used to assess for infiltration and scar tissue. Phase contrast velocity mapping was performed above the aortic and pulmonic valves CONTRAST:  10 cc  of Gadavist FINDINGS: Left ventricle: -Asymmetric hypertrophy measuring up to 16mm in septum (11mm in posterior wall). -Normal size -Normal systolic function -Normal ECV (27%) -RV insertion site LGE LV EF:  62% (Normal 52-79%) Absolute volumes: LV EDV: (Normal 78-167 mL) LV ESV: 66mL (Normal 21-64 mL) LV SV: (Normal 52-114 mL) CO: 4.6L/min (Normal 2.7-6.3 L/min) Indexed volumes: LV EDV: 52mL/sq-m (Normal 50-96 mL/sq-m) LV ESV: 74mL/sq-m (Normal 10-40 mL/sq-m) LV SV: 22mL/sq-m (Normal 33-64 mL/sq-m) CI: 2.3L/min/sq-m (  Normal 1.9-3.9 L/min/sq-m) Right ventricle: Normal size and systolic function RV EF: 55% (Normal 52-80%) Absolute volumes: RV EDV: (Normal 79-175 mL) RV ESV: 73mL (Normal 13-75 mL) RV SV: 89mL (Normal 56-110 mL) CO: 3.9L/min (Normal 2.7-6 L/min) Indexed volumes: RV EDV: 16mL/sq-m (Normal 51-97 mL/sq-m) RV ESV: 77mL/sq-m (Normal 9-42 mL/sq-m) RV SV: 17mL/sq-m (Normal 35-61 mL/sq-m) CI: 1.9L/min/sq-m (Normal 1.8-3.8 L/min/sq-m) Left atrium: Moderate enlargement Right atrium: Normal size Mitral valve: Mild regurgitation Aortic valve: Mild regurgitation.  Visually appears severe stenosis Tricuspid valve: Mild regurgitation Pulmonic valve: No regurgitation Aorta: Normal proximal ascending aorta Pulmonary artery: Dilated main pulmonary artery measuring 30mm Pericardium: Normal IMPRESSION: 1. Asymmetric LV hypertrophy measuring up to 16mm in septum (11mm in posterior wall). While this meets criteria for hypertrophic cardiomyopathy, aortic stenosis can also cause asymmetric LVH. Visually AS appears severe, and on review of  recent echo appears severe AS. Suspect LVH is due to severe aortic stenosis 2.  Normal LV size and systolic function (EF 62%) 3.  Normal RV size and systolic function (55%) 4. RV insertion site late gadolinium enhancement, which is a nonspecific scar pattern often seen in setting of elevated pulmonary pressures 5.  Dilated main pulmonary artery measuring 30mm Electronically Signed   By: Lonni Nanas M.D.   On: 03/08/2024 12:15    Microbiology: Results for orders placed or performed during the hospital encounter of 03/16/23  Surgical pcr screen     Status: None   Collection Time: 03/16/23 11:51 AM   Specimen: Nasal Mucosa; Nasal Swab  Result Value Ref Range Status   MRSA, PCR NEGATIVE NEGATIVE Final   Staphylococcus aureus NEGATIVE NEGATIVE Final    Comment: (NOTE) The Xpert SA Assay (FDA approved for NASAL specimens in patients 75 years of age and older), is one component of a comprehensive surveillance program. It is not intended to diagnose infection nor to guide or monitor treatment. Performed at [facility], [address] [place], [place] [address]     Labs: CBC: Recent Labs  Lab 04/03/24 2231 04/04/24 0538  WBC 8.7 7.7  NEUTROABS 7.1 6.2  HGB 12.7 11.0*  HCT 39.4 33.8*  MCV 92.7 92.3  PLT 209 210   Basic Metabolic Panel: Recent Labs  Lab 04/03/24 2231 04/04/24 0538  NA 137 140  K 3.8 3.9  CL 99 103  CO2 25 26  GLUCOSE 251* 167*  BUN 17 15  CREATININE 0.66 0.59  CALCIUM 9.2 8.7*  MG  --  2.0   Liver Function Tests: Recent Labs  Lab 04/03/24 2231  AST 20  ALT 16  ALKPHOS 95  BILITOT 1.3*  PROT 7.1  ALBUMIN 3.8   CBG: Recent Labs  Lab 04/04/24 0715 04/04/24 0830  GLUCAP 146* 153*    Discharge time spent: 35 minutes.  Length of inpatient stay: 0 days  Signed: Carliss LELON Canales, DO [facility] 04/04/2024

## 2024-04-05 ENCOUNTER — Other Ambulatory Visit: Payer: Self-pay

## 2024-04-05 ENCOUNTER — Emergency Department (HOSPITAL_COMMUNITY)
Admission: EM | Admit: 2024-04-05 | Discharge: 2024-04-06 | Disposition: A | Discharge status: Home / Self Care | Attending: Emergency Medicine | Admitting: Emergency Medicine

## 2024-04-05 DIAGNOSIS — I10 Essential (primary) hypertension: Secondary | ICD-10-CM | POA: Insufficient documentation

## 2024-04-05 DIAGNOSIS — R079 Chest pain, unspecified: Secondary | ICD-10-CM | POA: Diagnosis not present

## 2024-04-05 DIAGNOSIS — I2510 Atherosclerotic heart disease of native coronary artery without angina pectoris: Secondary | ICD-10-CM | POA: Diagnosis not present

## 2024-04-05 DIAGNOSIS — R7989 Other specified abnormal findings of blood chemistry: Secondary | ICD-10-CM | POA: Insufficient documentation

## 2024-04-05 DIAGNOSIS — E039 Hypothyroidism, unspecified: Secondary | ICD-10-CM | POA: Diagnosis not present

## 2024-04-05 DIAGNOSIS — R0602 Shortness of breath: Secondary | ICD-10-CM | POA: Insufficient documentation

## 2024-04-05 DIAGNOSIS — E119 Type 2 diabetes mellitus without complications: Secondary | ICD-10-CM | POA: Diagnosis not present

## 2024-04-05 DIAGNOSIS — R002 Palpitations: Secondary | ICD-10-CM | POA: Insufficient documentation

## 2024-04-05 NOTE — ED Triage Notes (Addendum)
 Arrives [facility] From home with shortness of breath with exertion, fluttering heart sensation and pain between shoulder blades.   Upon EMS arrival pt hr ~140. 22mg  Cardizem  administered enroute with reduction in heart rate to ~90-100bpm.   Compliant on home Cardizem . Watchman in place. Compliant on Plavix .

## 2024-04-06 ENCOUNTER — Telehealth: Payer: Self-pay | Admitting: Cardiology

## 2024-04-06 ENCOUNTER — Emergency Department (HOSPITAL_COMMUNITY)

## 2024-04-06 ENCOUNTER — Encounter (HOSPITAL_COMMUNITY): Payer: Self-pay

## 2024-04-06 DIAGNOSIS — R002 Palpitations: Secondary | ICD-10-CM | POA: Diagnosis not present

## 2024-04-06 DIAGNOSIS — R7989 Other specified abnormal findings of blood chemistry: Secondary | ICD-10-CM | POA: Diagnosis not present

## 2024-04-06 DIAGNOSIS — M7989 Other specified soft tissue disorders: Secondary | ICD-10-CM | POA: Insufficient documentation

## 2024-04-06 DIAGNOSIS — R079 Chest pain, unspecified: Secondary | ICD-10-CM | POA: Diagnosis not present

## 2024-04-06 DIAGNOSIS — R0602 Shortness of breath: Secondary | ICD-10-CM | POA: Diagnosis not present

## 2024-04-06 LAB — BASIC METABOLIC PANEL WITH GFR
Anion gap: 12 (ref 5–15)
BUN: 19 mg/dL (ref 8–23)
CO2: 25 mmol/L (ref 22–32)
Calcium: 9.2 mg/dL (ref 8.9–10.3)
Chloride: 102 mmol/L (ref 98–111)
Creatinine, Ser: 0.67 mg/dL (ref 0.44–1.00)
GFR, Estimated: >60 mL/min (ref >60)
Glucose, Bld: 207 mg/dL — ABNORMAL HIGH (ref 70–99)
Potassium: 4.1 mmol/L (ref 3.5–5.1)
Sodium: 139 mmol/L (ref 135–145)

## 2024-04-06 LAB — CBC
HCT: 37.5 % (ref 36.0–46.0)
Hemoglobin: 12 g/dL (ref 12.0–15.0)
MCH: 30.2 pg (ref 26.0–34.0)
MCHC: 32 g/dL (ref 30.0–36.0)
MCV: 94.5 fL (ref 80.0–100.0)
Platelets: 221 K/uL (ref 150–400)
RBC: 3.97 MIL/uL (ref 3.87–5.11)
RDW: 15.5 % (ref 11.5–15.5)
WBC: 7.6 K/uL (ref 4.0–10.5)
nRBC: 0 % (ref 0.0–0.2)

## 2024-04-06 LAB — PRO BRAIN NATRIURETIC PEPTIDE: Pro Brain Natriuretic Peptide: 3243 pg/mL — ABNORMAL HIGH (ref <300.0)

## 2024-04-06 LAB — MAGNESIUM: Magnesium: 2.1 mg/dL (ref 1.7–2.4)

## 2024-04-06 LAB — TROPONIN T, HIGH SENSITIVITY
Troponin T High Sensitivity: 25 ng/L — ABNORMAL HIGH (ref 0–19)
Troponin T High Sensitivity: 26 ng/L — ABNORMAL HIGH (ref 0–19)

## 2024-04-06 NOTE — ED Notes (Signed)
 Pt ambulatory to restroom. When pt returned she stated she felt slightly SOB and a flutter in her chest. EKG done and EDP made aware. Pt in NAD, VSS.

## 2024-04-06 NOTE — Discharge Instructions (Signed)
 You were evaluated in the Emergency Department and after careful evaluation, we did not find any emergent condition requiring admission or further testing in the hospital.  Your exam/testing today is overall reassuring.  Symptoms may be related to your atrial fibrillation/flutter.  Recommend continuing your home medications and following up closely with your cardiologist.  Please return to the Emergency Department if you experience any worsening of your condition.   Thank you for allowing us  to be a part of your care.

## 2024-04-06 NOTE — ED Provider Notes (Addendum)
 [facility] DE[facility] Emergency Department Provider Note MRN:  [id number]  Arrival date & time: 04/06/24     Chief Complaint   Shortness of Breath   History of Present Illness   Emily [name] is a 73 y.o. year-old female with a history of A-fib, diabetes presenting to the ED with chief complaint of shortness of breath.  Shortness of breath and palpitations starting suddenly while sitting on the couch this evening.  Has had this happen before, felt like the last time she went into A-fib.  Was recently in the hospital for the same and was on a drip and then she converted back to normal rhythm and was discharged yesterday.  Says she is not a good candidate for cardioversion.  Review of Systems  A thorough review of systems was obtained and all systems are negative except as noted in the HPI and PMH.   Patient's Health History    Past Medical History:  Diagnosis Date   Anemia    Arthritis    Atrial fibrillation, rapid (HCC) 10/27/2013   Bilateral carotid artery disease    Complication of anesthesia    difficult intubation in past   Diabetes mellitus without complication (HCC)    type 2   Difficult intubation    states 'lady that did the sleep study told me I have the smallest airway she has ever seen in an adult   Dyslipidemia 06/29/2017   Heart murmur    History of blood transfusion 09/2019   GI bleed - in [place]   Hypertension    Hypothyroid    Obesity    Obstructive sleep apnea    occasional uses CPAP   Peripheral vascular disease    Persistent atrial fibrillation (HCC) 01/16/2020   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 - xrays were negative   Upper GI bleed 09/25/2019    Past Surgical History:  Procedure Laterality Date   CATARACT EXTRACTION Left    CATARACT EXTRACTION W/PHACO Right 12/09/2015   Procedure: CATARACT EXTRACTION PHACO AND INTRAOCULAR LENS PLACEMENT (IOC);  Surgeon: Cherene Mania, MD;  Location: [facility];  Service:  Ophthalmology;  Laterality: Right;  CDE:10.48   COLONOSCOPY W/ POLYPECTOMY     cyst on back of neck removed     DILATION AND CURETTAGE OF UTERUS     x 5   ENDARTERECTOMY Left 08/30/2015   Procedure: LEFT CAROYID ENDARTERECTOMY WITH XENOSURE BOVINE PERICARDIUM PATCH ANGIOPLASTY;  Surgeon: Gaile LELON New, MD;  Location: [facility] OR;  Service: Vascular;  Laterality: Left;   TONSILLECTOMY     TRANSCAROTID ARTERY REVASCULARIZATION  Right 03/19/2023   Procedure: Right Transcarotid Artery Revascularization;  Surgeon: New Gaile LELON, MD;  Location: [facility] OR;  Service: Vascular;  Laterality: Right;   ULTRASOUND GUIDANCE FOR VASCULAR ACCESS Left 03/19/2023   Procedure: ULTRASOUND GUIDANCE FOR VASCULAR ACCESS;  Surgeon: New Gaile LELON, MD;  Location: [facility] OR;  Service: Vascular;  Laterality: Left;   UPPER GI ENDOSCOPY     x several    Family History  Problem Relation Age of Onset   COPD Father    Heart failure Father    Heart disease Father    Emphysema Father    Arrhythmia Sister    Arrhythmia Sister    Arrhythmia Sister        had PPM also   Cancer Sister     Social History   Socioeconomic History   Marital status: Single    Spouse name: Not on  file   Number of children: Not on file   Years of education: Not on file   Highest education level: Not on file  Occupational History   Not on file  Tobacco Use   Smoking status: Never    Passive exposure: Never   Smokeless tobacco: Never  Vaping Use   Vaping status: Never Used  Substance and Sexual Activity   Alcohol use: No    Alcohol/week: 0.0 standard drinks of alcohol   Drug use: Never   Sexual activity: Not Currently    Birth control/protection: Post-menopausal  Other Topics Concern   Not on file  Social History Narrative   Not on file   Social Drivers of Health   Financial Resource Strain: Low Risk  (04/21/2022)   Overall Financial Resource Strain (CARDIA)    Difficulty of Paying Living Expenses: Not hard at all  Food  Insecurity: No Food Insecurity (03/22/2023)   Hunger Vital Sign    Worried About Running Out of Food in the Last Year: Never true    Ran Out of Food in the Last Year: Never true  Transportation Needs: No Transportation Needs (03/22/2023)   PRAPARE - Administrator, Civil Service (Medical): No    Lack of Transportation (Non-Medical): No  Physical Activity: Inactive (04/21/2022)   Exercise Vital Sign    Days of Exercise per Week: 0 days    Minutes of Exercise per Session: 0 min  Stress: No Stress Concern Present (04/21/2022)   [employer] of Occupational Health - Occupational Stress Questionnaire    Feeling of Stress : Not at all  Social Connections: Moderately Integrated (04/21/2022)   Social Connection and Isolation Panel    Frequency of Communication with Friends and Family: More than three times a week    Frequency of Social Gatherings with Friends and Family: Twice a week    Attends Religious Services: More than 4 times per year    Active Member of [employer] or Organizations: Yes    Attends Banker Meetings: More than 4 times per year    Marital Status: Never married  Intimate Partner Violence: Not At Risk (03/19/2023)   Humiliation, Afraid, Rape, and Kick questionnaire    Fear of Current or Ex-Partner: No    Emotionally Abused: No    Physically Abused: No    Sexually Abused: No     Physical Exam   Vitals:   04/06/24 0410 04/06/24 0430  BP:  (!) 120/90  Pulse:  98  Resp:  10  Temp: 98 F (36.7 C)   SpO2:  95%    CONSTITUTIONAL: Well-appearing, NAD NEURO/PSYCH:  Alert and oriented x 3, no focal deficits EYES:  eyes equal and reactive ENT/NECK:  no LAD, no JVD CARDIO: Regular rate, well-perfused, normal S1 and S2 PULM:  CTAB no wheezing or rhonchi GI/GU:  non-distended, non-tender MSK/SPINE:  No gross deformities, no edema SKIN:  no rash, atraumatic   *Additional and/or pertinent findings included in MDM below  Diagnostic and  Interventional Summary    EKG Interpretation Date/Time:  Thursday April 06 2024 00:01:52 EST Ventricular Rate:  84 PR Interval:    QRS Duration:  160 QT Interval:  442 QTC Calculation: 523 R Axis:   -74  Text Interpretation: Atrial flutter with predominant 3:1 AV block Right bundle branch block LVH with IVCD and secondary repol abnrm Prolonged QT interval Confirmed by Theadore Sharper [phone]) on 04/06/2024 12:51:12 AM       Labs Reviewed  BASIC  METABOLIC PANEL WITH GFR - Abnormal; Notable for the following components:      Result Value   Glucose, Bld 207 (*)    All other components within normal limits  PRO BRAIN NATRIURETIC PEPTIDE - Abnormal; Notable for the following components:   Pro Brain Natriuretic Peptide 3,243.0 (*)    All other components within normal limits  TROPONIN T, HIGH SENSITIVITY - Abnormal; Notable for the following components:   Troponin T High Sensitivity 25 (*)    All other components within normal limits  TROPONIN T, HIGH SENSITIVITY - Abnormal; Notable for the following components:   Troponin T High Sensitivity 26 (*)    All other components within normal limits  CBC  MAGNESIUM     DG Chest 2 View  Final Result      Medications - No data to display   Procedures  /  Critical Care Procedures  ED Course and Medical Decision Making  Initial Impression and Ddx Seems the patient has converted back to atrial fib or flutter and is mildly symptomatic from this.  Rates are reasonable at this time in the 90s.  Was not able to be cardioverted because she has a It trainer.  Recently started on amiodarone.  She is well-appearing in no acute distress, rechecking labs, monitoring closely.  May need to reach back out to cardiology.  Past medical/surgical history that increases complexity of ED encounter: Atrial fib/flutter  Interpretation of Diagnostics I personally reviewed the EKG and my interpretation is as follows: Sinus rhythm with frequent PAC,  possibly with brief runs of atrial flutter  No significant blood count or electrolyte disturbance.  Troponin minimally elevated and flat upon repeat  Patient Reassessment and Ultimate Disposition/Management     Patient feeling well for 2 or 3 hours of observation here in the emergency department.  Rates have been controlled in the 80s to 90s.  Monitoring has been showing fairly consistent sinus rhythm but with frequent ectopy.  Still no real indication for admission or IV medications or further testing.  She has close follow-up with cardiology, she has a new medication regiment, will message her cardiologist, appropriate for discharge.  3:45 AM update: Prior to discharge patient walked to the restroom and started feeling palpitations and malaise again.  Feels better getting back in bed.  Rates still in the 90s.  Repeat EKG now more suspicious for A-fib.  Still not sure what more to offer patient given controlled rates.  Will discuss with cardiology, possibly her medications can be adjusted.  4:40 AM update: I spoke with Dr. Melia of cardiology who has reviewed the patient's chart after he reviewing of patient's more recent symptoms.  Dr. Larrie agrees that the most recent EKG does look like A-fib however with controlled rates and reassuring workup there is not much to do.  Would not advise any changes to her medication at this time, will need to follow-up in the A-fib clinic or with her cardiologist.  Patient advised to take it easy over the next couple days, continue taking her medications, strict return precautions discussed.  Patient management required discussion with the following services or consulting groups:  None  Complexity of Problems Addressed Acute illness or injury that poses threat of life of bodily function  Additional Data Reviewed and Analyzed Further history obtained from: Prior ED visit notes, Recent discharge summary, and Recent Consult notes  Additional Factors Impacting ED  Encounter Risk Consideration of hospitalization  Ozell HERO. Theadore, MD [facility] Emergency Medicine [facility]  [facility] [email or web address]  Final Clinical Impressions(s) / ED Diagnoses     ICD-10-CM   1. Palpitations  R00.2     2. Chest pain, unspecified type  R07.9       ED Discharge Orders     None        Discharge Instructions Discussed with and Provided to Patient:     Discharge Instructions      You were evaluated in the Emergency Department and after careful evaluation, we did not find any emergent condition requiring admission or further testing in the hospital.  Your exam/testing today is overall reassuring.  Symptoms may be related to your atrial fibrillation/flutter.  Recommend continuing your home medications and following up closely with your cardiologist.  Please return to the Emergency Department if you experience any worsening of your condition.   Thank you for allowing us  to be a part of your care.       Theadore Ozell HERO, MD 04/06/24 [id number]    Theadore Ozell HERO, MD 04/06/24 [phone]

## 2024-04-06 NOTE — Progress Notes (Unsigned)
 Cardiology Office Note:   Date:  04/07/2024  ID:  Clarity Ciszek, DOB 21-Feb-1951, MRN [id number] PCP: Jolinda Norene HERO, DO  [facility] Providers Cardiologist:  Lynwood Schilling, MD Electrophysiologist:  Will Gladis Norton, MD {  History of Present Illness:   Emily [name] is a 73 y.o. female who presents for the patient has paroxysmal atrial fibrillation and was treated with flecainide . She has had recurrent GI bleeding and could not tolerate anticoagulation. She has had transfusions. She subsequently had double-balloon enteroscopy. 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 She did have Watchman.   She also had a follow up echo on 02/22/23 and had a normal EF with moderate aortic stenosis and a gradient of 24.2 mean.   She had carotid stenting.    She was in the hospital with atrial fib earlier this month.  I reviewed these records for this visit.   She converted spontaneously to NSR.  Flecainide  was changed to amiodarone.   She was back in the ED shortly after discharge. When she was in the ED she was back in fib but was not cardioverted.  She was continued with rate control.  She has not felt like she has been in atrial fibrillation.  Her blood pressures have been fluctuating somewhat.  She is not having any new shortness of breath, PND or orthopnea.  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  She is not having any presyncope or syncope.  She is not having any chest pressure, neck or arm discomfort.  She has a little lower extremity swelling but she did not take her diuretic today.  ROS: As stated in the HPI and negative for all other systems.  Studies Reviewed:    EKG:   EKG Interpretation Date/Time:  Friday April 07 2024 16:06:19 EST Ventricular Rate:  102 PR Interval:    QRS Duration:  150 QT Interval:  396 QTC Calculation: 516 R Axis:   -69  Text Interpretation: Atrial fibrillation with rapid ventricular response Right bundle  branch block Left anterior fascicular block Bifascicular block Left ventricular hypertrophy with repolarization abnormality ( R in aVL , Romhilt-Estes ) When compared with ECG of 06-Apr-2024 03:36, No significant change since last tracing Confirmed by Schilling Lynwood ([id number]) on 04/07/2024 4:09:06 PM     Risk Assessment/Calculations:    CHA2DS2-VASc Score = 4   This indicates a 4.8% annual risk of stroke. The patient's score is based upon: CHF History: 0 HTN History: 1 Diabetes History: 0 Stroke History: 0 Vascular Disease History: 1 Age Score: 1 Gender Score: 1    Physical Exam:   VS:  BP (!) 105/55   Pulse (!) 109   Ht 5' 3 (1.6 m)   Wt 205 lb (93 kg)   SpO2 97%   BMI 36.31 kg/m    Wt Readings from Last 3 Encounters:  04/07/24 205 lb (93 kg)  04/05/24 200 lb (90.7 kg)  04/03/24 200 lb (90.7 kg)     GEN: Well nourished, well developed in no acute distress NECK: No JVD; No carotid bruits CARDIAC: Irregular RR, 3 out of 6 systolic murmur radiating slightly at the aortic outflow tract, no diastolic murmurs, rubs, gallops RESPIRATORY:  Clear to auscultation without rales, wheezing or rhonchi  ABDOMEN: Soft, non-tender, non-distended EXTREMITIES:  No edema; No deformity   ASSESSMENT AND PLAN:   Carotid stenosis: She status post carotid stenting.  She will continue with risk reduction.   Bradycardia:  She seems to be tolerating current medications.  I think she is going in and out of fibrillation most likely and when she needs in sinus she is rather slow.  No titration of her meds.    AS:    Echo in July demonstrated a mean gradient of 36 mm.  She is to see Dr. Wonda soon.  She will be considered for TAVR.  PAF:    With her increasing left ventricular perjury she is now off flecainide  and on amiodarone.  I am going to set her up to see EP to consider ablation.  She has a youth worker.  She is sending in a Zio patch today.  I suspect will now see paroxysmal A-fib and there is no  indication for cardioversion.  Leg swelling: She will use as needed Lasix .  Hypertension: Her blood is very labile.  No change in therapy.  LVH: This is likely related to her aortic stenosis.  We will follow this clinically.  I am not strongly suspecting asymmetric septal hypertrophy and there is no evidence of infiltrative disease.     Follow up with me after EP and follow-up with Dr. Wonda.  Signed, Lynwood Schilling, MD

## 2024-04-06 NOTE — Telephone Encounter (Signed)
 Patient c/o Palpitations:  STAT if patient reporting lightheadedness, shortness of breath, or chest pain  How long have you had palpitations/irregular HR/ Afib? Are you having the symptoms now?   No  Are you currently experiencing lightheadedness, SOB or CP?   No  Do you have a history of afib (atrial fibrillation) or irregular heart rhythm?   Yes  Have you checked your BP or HR? (document readings if available):   HR 91  BP - not available  Are you experiencing any other symptoms?   No  Patient stated she has been to ED on Monday and last night due to her racing heart.  Patient wants advice on next steps.

## 2024-04-06 NOTE — Telephone Encounter (Signed)
 Returned patient's call. 2 identifiers used to verify patient identity. Patient reports she was in ED last night for afib, this was the second time in a week. She states they changed her meds (d/c flecainide  and started amiodarone). She states that while her only symptom is fatigue and her rates are controlled she is scared to use her PRN cardizem  with these new medications and would like to talk to Dr. Lavona. Appt made for 04/07/24. Patient verbalizes understanding to present to ED or call 911 if chest pain or SOB develop.

## 2024-04-06 NOTE — Telephone Encounter (Signed)
 Afib Clinic will be happy to see pt if needed. Thanks

## 2024-04-07 ENCOUNTER — Telehealth: Payer: Self-pay

## 2024-04-07 ENCOUNTER — Encounter: Payer: Self-pay | Admitting: Cardiology

## 2024-04-07 ENCOUNTER — Ambulatory Visit: Attending: Cardiology | Admitting: Cardiology

## 2024-04-07 VITALS — BP 105/55 | HR 109 | Ht 63.0 in | Wt 205.0 lb

## 2024-04-07 DIAGNOSIS — I10 Essential (primary) hypertension: Secondary | ICD-10-CM

## 2024-04-07 DIAGNOSIS — I350 Nonrheumatic aortic (valve) stenosis: Secondary | ICD-10-CM | POA: Diagnosis not present

## 2024-04-07 DIAGNOSIS — I4891 Unspecified atrial fibrillation: Secondary | ICD-10-CM

## 2024-04-07 DIAGNOSIS — M7989 Other specified soft tissue disorders: Secondary | ICD-10-CM | POA: Diagnosis not present

## 2024-04-07 NOTE — Telephone Encounter (Signed)
   Emailed Ozempic  0.5mg  refill to [facility].  The following changed will apply for 2026: Medicare patients will no longer be eligible for Ozempic .

## 2024-04-07 NOTE — Patient Instructions (Signed)
 Medication Instructions:  Your physician recommends that you continue on your current medications as directed. Please refer to the Current Medication list given to you today.  *If you need a refill on your cardiac medications before your next appointment, please call your pharmacy*  Lab Work: NONE If you have labs (blood work) drawn today and your tests are completely normal, you will receive your results only by: MyChart Message (if you have MyChart) OR A paper copy in the mail If you have any lab test that is abnormal or we need to change your treatment, we will call you to review the results.  Testing/Procedures: NONE  Follow-Up: At [facility], you and your health needs are our priority.  As part of our continuing mission to provide you with exceptional heart care, our providers are all part of one team.  This team includes your primary Cardiologist (physician) and Advanced Practice Providers or APPs (Physician Assistants and Nurse Practitioners) who all work together to provide you with the care you need, when you need it.  Your next appointment:   As scheduled  Provider:   Vicci RIGGERS  We recommend signing up for the patient portal called MyChart.  Sign up information is provided on this After Visit Summary.  MyChart is used to connect with patients for Virtual Visits (Telemedicine).  Patients are able to view lab/test results, encounter notes, upcoming appointments, etc.  Non-urgent messages can be sent to your provider as well.   To learn more about what you can do with MyChart, go to [email or web address].   Other Instructions You have been referred to our Electrophysiology team. Someone will reach out to you to make an appointment.

## 2024-04-11 ENCOUNTER — Ambulatory Visit: Payer: Self-pay

## 2024-04-11 ENCOUNTER — Other Ambulatory Visit: Payer: Self-pay | Admitting: Family Medicine

## 2024-04-11 DIAGNOSIS — E1169 Type 2 diabetes mellitus with other specified complication: Secondary | ICD-10-CM

## 2024-04-11 NOTE — Telephone Encounter (Signed)
 FYI Only or Action Required?: Action required by provider: medication refill request.  Patient was last seen in primary care on 03/14/2024 by Jolinda Norene HERO, DO.  Called Nurse Triage reporting Medication Refill.  Triage Disposition: Call PCP When Office is Open  Patient/caregiver understands and will follow disposition?:   Reason for Disposition  [1] Prescription refill request for NON-ESSENTIAL medicine (i.e., no harm to patient if med not taken) AND [2] triager unable to refill per department policy  Answer Assessment - Initial Assessment Questions Patient is needing Requip  refilled. Patient states she is currently out of her medication. Needing a call once medication is sent to pharmacy  1. DRUG NAME: What medicine do you need to have refilled?     Requip  0.5 mg 2. REFILLS REMAINING: How many refills are remaining? Notes: The label on the medicine or pill bottle will show how many refills are remaining. If there are no refills remaining, then a renewal may be needed.     0 3. EXPIRATION DATE: What is the expiration date? Note: The label states when the prescription will expire, and thus can no longer be refilled.)     03/01/2024 4. PRESCRIBER: Who prescribed it? Note: The prescribing doctor or group is responsible for refill approvals.SABRA Jolinda 5. PHARMACY: Have you contacted your pharmacy (drugstore)? Note: Some pharmacies will contact the doctor (or NP/PA).      yes 6. SYMPTOMS: Do you have any symptoms?     no  Protocols used: Medication Refill and Renewal Call-A-AH

## 2024-04-11 NOTE — Telephone Encounter (Signed)
 This RN made first attempt to reach patient. LVM with office callback number.  Copied from CRM [id number]. Topic: Clinical - Medication Question >> Apr 11, 2024 10:06 AM Emily [name] wrote: Reason for CRM: Pt called to inform office she had requested rOPINIRole  (REQUIP ) 0.5 MG tablet  and glipiZIDE  (GLUCOTROL ) 5 MG tablet through her pharmacy. The pharmacy told pt to let office know she need it today. PT was advised all medication refills can take up to 3 business days to comeplete. She states she need the medication by tonight

## 2024-04-11 NOTE — Telephone Encounter (Signed)
 Provider already refilled. LS

## 2024-04-12 ENCOUNTER — Ambulatory Visit: Admitting: Family Medicine

## 2024-04-12 ENCOUNTER — Encounter: Payer: Self-pay | Admitting: Family Medicine

## 2024-04-12 VITALS — BP 102/56 | HR 80 | Temp 96.8°F | Ht 62.0 in | Wt 203.1 lb

## 2024-04-12 DIAGNOSIS — I350 Nonrheumatic aortic (valve) stenosis: Secondary | ICD-10-CM | POA: Diagnosis not present

## 2024-04-12 DIAGNOSIS — E119 Type 2 diabetes mellitus without complications: Secondary | ICD-10-CM

## 2024-04-12 DIAGNOSIS — I480 Paroxysmal atrial fibrillation: Secondary | ICD-10-CM

## 2024-04-12 DIAGNOSIS — J302 Other seasonal allergic rhinitis: Secondary | ICD-10-CM | POA: Diagnosis not present

## 2024-04-12 DIAGNOSIS — Z7985 Long-term (current) use of injectable non-insulin antidiabetic drugs: Secondary | ICD-10-CM

## 2024-04-12 MED ORDER — GLIPIZIDE 5 MG PO TABS: 5.0000 mg | ORAL_TABLET | Freq: Every day | ORAL | Status: DC

## 2024-04-12 MED ORDER — FLUTICASONE PROPIONATE 50 MCG/ACT NA SUSP: 2.0000 | Freq: Every day | NASAL | 6 refills | Status: AC

## 2024-04-12 NOTE — Progress Notes (Signed)
 Subjective: CC:ER follow up PCP: Emily Norene HERO, Emily [name] Emily [name] is a 73 y.o. female presenting to clinic today for:  Seen in ER for Afib with RVR 04/03/24.  Given cardizem  as she is not anticoagulated and could not be cardioverted.  Planned for admission but for some reason was sent home instead.  She presented back in ER 2 days later for same.  She was seen by cardiologist outpatient on 04/07/2024 and plans for ablation going forward as she is not a cardioversion candidate. She reports ongoing fatigue due to tachycardia.  She has not yet heard for specialist appointment.  No chest pain or shortness of breath.  Not actually feeling the heart palpitations but just feels fatigued.  She does report feeling a sinus pressure and like she has her head in a vice.  Not currently on any oral antihistamines or taking any nasal sprays.  Reports that her blood sugar has also been elevated in the morning anywhere from 180s to 200s.  He is currently on Ozempic  and has constipation with that so is wondering if she might be able to add back her glipizide .   ROS: Per HPI  Allergies  Allergen Reactions   Jardiance  [Empagliflozin ] Other (See Comments)    RECURRENT VAGINITIS   Quinapril Hcl Cough   Statins Other (See Comments)    Not all Statins but some cause cough and pain in legs.   Tape Other (See Comments)    Redness, please use paper tape   Past Medical History:  Diagnosis Date   Anemia    Arthritis    Atrial fibrillation, rapid (HCC) 10/27/2013   Bilateral carotid artery disease    Complication of anesthesia    difficult intubation in past   Diabetes mellitus without complication (HCC)    type 2   Difficult intubation    states 'lady that did the sleep study told me I have the smallest airway she has ever seen in an adult   Dyslipidemia 06/29/2017   Heart murmur    History of blood transfusion 09/2019   GI bleed - in [place]   Hypertension    Hypothyroid    Obesity     Obstructive sleep apnea    occasional uses CPAP   Peripheral vascular disease    Persistent atrial fibrillation (HCC) 01/16/2020   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 - xrays were negative   Upper GI bleed 09/25/2019    Current Outpatient Medications:    amiodarone (PACERONE) 200 MG tablet, Take 1 tablet (200 mg total) by mouth daily., Disp: 30 tablet, Rfl: 0   bisacodyl  (DULCOLAX) 5 MG EC tablet, Take 5 mg by mouth daily as needed (constipation)., Disp: , Rfl:    clopidogrel  (PLAVIX ) 75 MG tablet, TAKE 1 TABLET BY MOUTH EVERY DAY, Disp: 90 tablet, Rfl: 2   diltiazem  (CARDIZEM ) 30 MG tablet, Take 1 tablet (30 mg total) by mouth 3 (three) times daily as needed (afib)., Disp: 30 tablet, Rfl: 0   diltiazem  (CARTIA  XT) 240 MG 24 hr capsule, Take 1 capsule (240 mg total) by mouth daily., Disp: 30 capsule, Rfl: 0   diphenhydramine -acetaminophen  (TYLENOL  PM) 25-500 MG TABS tablet, Take 1 tablet by mouth at bedtime as needed (Pain)., Disp: , Rfl:    ezetimibe  (ZETIA ) 10 MG tablet, TAKE 1 TABLET BY MOUTH EVERY DAY, Disp: 90 tablet, Rfl: 2   furosemide  (LASIX ) 20 MG tablet, Take 1 tablet (20 mg total) by mouth daily as needed  for fluid., Disp: 90 tablet, Rfl: 3   gabapentin  (NEURONTIN ) 300 MG capsule, TAKE 1 CAPSULE (300 MG TOTAL) BY MOUTH 2 (TWO) TIMES DAILY AS NEEDED (NEUROPATHY RIGHT LEG)., Disp: 180 capsule, Rfl: 0   levonorgestrel (MIRENA) 20 MCG/DAY IUD, 1 each by Intrauterine route once., Disp: , Rfl:    levothyroxine  (SYNTHROID ) 112 MCG tablet, TAKE 1 TABLET BY MOUTH EVERY OTHER DAY, ALTERNATING WITH 1 & 1/2 TABLETS EVERY OTHER DAY, Disp: 112 tablet, Rfl: 3   linaclotide  (LINZESS ) 290 MCG CAPS capsule, Take 1 capsule (290 mcg total) by mouth daily before breakfast. (Patient taking differently: Take 290 mcg by mouth daily as needed (Constipation).), Disp: , Rfl:    losartan  (COZAAR ) 25 MG tablet, TAKE 1 TABLET (25 MG TOTAL) BY MOUTH DAILY., Disp: 90 tablet, Rfl: 2    lovastatin  (MEVACOR ) 40 MG tablet, TAKE 1 TABLET BY MOUTH EVERY DAY IN THE EVENING, Disp: 90 tablet, Rfl: 3   metFORMIN  (GLUCOPHAGE ) 1000 MG tablet, Take 1 tablet (1,000 mg total) by mouth 2 (two) times daily with a meal. (Patient taking differently: Take 500 mg by mouth 2 (two) times daily with a meal.), Disp: 180 tablet, Rfl: 3   metoprolol  succinate (TOPROL -XL) 50 MG 24 hr tablet, Take 1 tablet (50 mg total) by mouth at bedtime. Hold if systolic BP  less than 100 or HR less than 50 bpm, Disp: 90 tablet, Rfl: 3   polyethylene glycol (MIRALAX  / GLYCOLAX ) 17 g packet, Take 17 g by mouth daily as needed for moderate constipation., Disp: , Rfl:    psyllium (METAMUCIL) 58.6 % packet, Take 1 packet by mouth daily as needed (Constipation)., Disp: , Rfl:    rOPINIRole  (REQUIP ) 0.5 MG tablet, TAKE 1 TABLET BY MOUTH AT BEDTIME., Disp: 90 tablet, Rfl: 0   Semaglutide ,0.25 or 0.5MG /DOS, 2 MG/3ML SOPN, Inject 0.5 mg into the skin once a week. (Patient taking differently: Inject 0.25-0.5 mg into the skin once a week.), Disp: 3 mL, Rfl:    triamcinolone  cream (KENALOG ) 0.1 %, Apply 1 Application topically 2 (two) times daily. X10 days as needed for rash, Disp: 30 g, Rfl: 0  Current Facility-Administered Medications:    cyanocobalamin  ((VITAMIN B-12)) injection 1,000 mcg, 1,000 mcg, Intramuscular, Q30 days, Emily Jayson CROME, MD, 1,000 mcg at 03/21/24 1051 Social History   Socioeconomic History   Marital status: Single    Spouse name: Not on file   Number of children: Not on file   Years of education: Not on file   Highest education level: Not on file  Occupational History   Not on file  Tobacco Use   Smoking status: Never    Passive exposure: Never   Smokeless tobacco: Never  Vaping Use   Vaping status: Never Used  Substance and Sexual Activity   Alcohol use: No    Alcohol/week: 0.0 standard drinks of alcohol   Drug use: Never   Sexual activity: Not Currently    Birth control/protection:  Post-menopausal  Other Topics Concern   Not on file  Social History Narrative   Not on file   Social Drivers of Health   Financial Resource Strain: Low Risk  (04/21/2022)   Overall Financial Resource Strain (CARDIA)    Difficulty of Paying Living Expenses: Not hard at all  Food Insecurity: No Food Insecurity (03/22/2023)   Hunger Vital Sign    Worried About Running Out of Food in the Last Year: Never true    Ran Out of Food in the Last Year: Never  true  Transportation Needs: No Transportation Needs (03/22/2023)   PRAPARE - Administrator, Civil Service (Medical): No    Lack of Transportation (Non-Medical): No  Physical Activity: Inactive (04/21/2022)   Exercise Vital Sign    Days of Exercise per Week: 0 days    Minutes of Exercise per Session: 0 min  Stress: No Stress Concern Present (04/21/2022)   [employer] of Occupational Health - Occupational Stress Questionnaire    Feeling of Stress : Not at all  Social Connections: Moderately Integrated (04/21/2022)   Social Connection and Isolation Panel    Frequency of Communication with Friends and Family: More than three times a week    Frequency of Social Gatherings with Friends and Family: Twice a week    Attends Religious Services: More than 4 times per year    Active Member of [employer] or Organizations: Yes    Attends Engineer, Structural: More than 4 times per year    Marital Status: Never married  Intimate Partner Violence: Not At Risk (03/19/2023)   Humiliation, Afraid, Rape, and Kick questionnaire    Fear of Current or Ex-Partner: No    Emotionally Abused: No    Physically Abused: No    Sexually Abused: No   Family History  Problem Relation Age of Onset   COPD Father    Heart failure Father    Heart disease Father    Emphysema Father    Arrhythmia Sister    Arrhythmia Sister    Arrhythmia Sister        had PPM also   Cancer Sister     Objective: Office vital signs reviewed. BP (!)  102/56   Pulse 80   Temp (!) 96.8 F (36 C)   Ht 5' 2 (1.575 m)   Wt 203 lb 2 oz (92.1 kg)   SpO2 97%   BMI 37.15 kg/m   Physical Examination:  General: Awake, alert, nontoxic female, No acute distress HEENT: Narrow nasal passages.  No gross drainage.  Moist mucous membranes Cardio: Irregularly irregular with murmur appreciated Pulm: clear to auscultation bilaterally, no wheezes, rhonchi or rales; normal work of breathing on room air  Assessment/ Plan: 73 y.o. female   PAF (paroxysmal atrial fibrillation) (HCC)  Nonrheumatic aortic valve stenosis  Seasonal allergic rhinitis, unspecified trigger - Plan: fluticasone (FLONASE) 50 MCG/ACT nasal spray  Diabetes mellitus treated with injections of non-insulin  medication (HCC) - Plan: glipiZIDE  (GLUCOTROL ) 5 MG tablet   Continues to have A-fib with plans for ablation.  Discussed she may need to hold her Ozempic  prior to that procedure  Flonase added for rhinosinusitis.  Has home glipizide  which she may resume for elevation of blood sugars.  She will monitor closely and we will reconvene in December for annual physical, sooner if concerns arise  Norene CHRISTELLA Fielding, Emily [name] [facility] [phone]

## 2024-04-13 ENCOUNTER — Telehealth: Payer: Self-pay | Admitting: Physician Assistant

## 2024-04-13 ENCOUNTER — Encounter: Payer: Self-pay | Admitting: Cardiology

## 2024-04-13 NOTE — Telephone Encounter (Signed)
 Patient paged after hour answering service due to recurrent atrial fibrillation.  Patient was recently started on 240 mg of Cardizem  CD for maintenance therapy for her A-fib.  Usually control her heart rate fairly well in the morning.  However by nighttime, her heart rate will go up.  She contacted our our answering service, she says her heart rate is currently in the 130s, she has some as needed dose of short acting diltiazem  she can take.  I instructed her to take a dose of short acting 30 mg diltiazem  if systolic blood pressure is greater than 100 mmHg and heart rate is above 120 bpm.

## 2024-04-13 NOTE — Telephone Encounter (Signed)
 Error

## 2024-04-14 DIAGNOSIS — I4719 Other supraventricular tachycardia: Secondary | ICD-10-CM | POA: Diagnosis not present

## 2024-04-14 DIAGNOSIS — I4891 Unspecified atrial fibrillation: Secondary | ICD-10-CM | POA: Diagnosis not present

## 2024-04-17 ENCOUNTER — Encounter (HOSPITAL_COMMUNITY): Payer: Self-pay

## 2024-04-17 ENCOUNTER — Other Ambulatory Visit: Payer: Self-pay

## 2024-04-17 ENCOUNTER — Inpatient Hospital Stay (HOSPITAL_COMMUNITY)
Admission: EM | Admit: 2024-04-17 | Discharge: 2024-04-25 | DRG: 286 | Disposition: A | Discharge status: Home / Self Care | Attending: Internal Medicine | Admitting: Internal Medicine

## 2024-04-17 ENCOUNTER — Emergency Department (HOSPITAL_COMMUNITY)

## 2024-04-17 ENCOUNTER — Telehealth: Payer: Self-pay | Admitting: Cardiology

## 2024-04-17 ENCOUNTER — Ambulatory Visit: Payer: Self-pay | Admitting: Cardiology

## 2024-04-17 DIAGNOSIS — E785 Hyperlipidemia, unspecified: Secondary | ICD-10-CM

## 2024-04-17 DIAGNOSIS — I428 Other cardiomyopathies: Secondary | ICD-10-CM | POA: Diagnosis present

## 2024-04-17 DIAGNOSIS — I110 Hypertensive heart disease with heart failure: Secondary | ICD-10-CM | POA: Diagnosis present

## 2024-04-17 DIAGNOSIS — E782 Mixed hyperlipidemia: Secondary | ICD-10-CM | POA: Diagnosis present

## 2024-04-17 DIAGNOSIS — Z886 Allergy status to analgesic agent status: Secondary | ICD-10-CM

## 2024-04-17 DIAGNOSIS — F419 Anxiety disorder, unspecified: Secondary | ICD-10-CM | POA: Diagnosis present

## 2024-04-17 DIAGNOSIS — Z95818 Presence of other cardiac implants and grafts: Secondary | ICD-10-CM

## 2024-04-17 DIAGNOSIS — E1140 Type 2 diabetes mellitus with diabetic neuropathy, unspecified: Secondary | ICD-10-CM | POA: Diagnosis present

## 2024-04-17 DIAGNOSIS — Z7984 Long term (current) use of oral hypoglycemic drugs: Secondary | ICD-10-CM

## 2024-04-17 DIAGNOSIS — M7989 Other specified soft tissue disorders: Secondary | ICD-10-CM | POA: Diagnosis present

## 2024-04-17 DIAGNOSIS — I4510 Unspecified right bundle-branch block: Secondary | ICD-10-CM | POA: Diagnosis present

## 2024-04-17 DIAGNOSIS — I4819 Other persistent atrial fibrillation: Secondary | ICD-10-CM | POA: Diagnosis not present

## 2024-04-17 DIAGNOSIS — Z6837 Body mass index (BMI) 37.0-37.9, adult: Secondary | ICD-10-CM

## 2024-04-17 DIAGNOSIS — R9431 Abnormal electrocardiogram [ECG] [EKG]: Secondary | ICD-10-CM

## 2024-04-17 DIAGNOSIS — R600 Localized edema: Secondary | ICD-10-CM

## 2024-04-17 DIAGNOSIS — Z888 Allergy status to other drugs, medicaments and biological substances status: Secondary | ICD-10-CM

## 2024-04-17 DIAGNOSIS — Z951 Presence of aortocoronary bypass graft: Secondary | ICD-10-CM

## 2024-04-17 DIAGNOSIS — Z7902 Long term (current) use of antithrombotics/antiplatelets: Secondary | ICD-10-CM

## 2024-04-17 DIAGNOSIS — I358 Other nonrheumatic aortic valve disorders: Secondary | ICD-10-CM | POA: Diagnosis present

## 2024-04-17 DIAGNOSIS — E66812 Obesity, class 2: Secondary | ICD-10-CM | POA: Diagnosis present

## 2024-04-17 DIAGNOSIS — J90 Pleural effusion, not elsewhere classified: Secondary | ICD-10-CM | POA: Diagnosis not present

## 2024-04-17 DIAGNOSIS — F05 Delirium due to known physiological condition: Secondary | ICD-10-CM | POA: Diagnosis present

## 2024-04-17 DIAGNOSIS — Z7985 Long-term (current) use of injectable non-insulin antidiabetic drugs: Secondary | ICD-10-CM

## 2024-04-17 DIAGNOSIS — Z79899 Other long term (current) drug therapy: Secondary | ICD-10-CM

## 2024-04-17 DIAGNOSIS — I480 Paroxysmal atrial fibrillation: Principal | ICD-10-CM | POA: Diagnosis present

## 2024-04-17 DIAGNOSIS — I2510 Atherosclerotic heart disease of native coronary artery without angina pectoris: Secondary | ICD-10-CM

## 2024-04-17 DIAGNOSIS — Z8249 Family history of ischemic heart disease and other diseases of the circulatory system: Secondary | ICD-10-CM

## 2024-04-17 DIAGNOSIS — I25118 Atherosclerotic heart disease of native coronary artery with other forms of angina pectoris: Secondary | ICD-10-CM | POA: Diagnosis present

## 2024-04-17 DIAGNOSIS — F32A Depression, unspecified: Secondary | ICD-10-CM | POA: Diagnosis present

## 2024-04-17 DIAGNOSIS — I350 Nonrheumatic aortic (valve) stenosis: Secondary | ICD-10-CM | POA: Diagnosis present

## 2024-04-17 DIAGNOSIS — E1165 Type 2 diabetes mellitus with hyperglycemia: Secondary | ICD-10-CM | POA: Diagnosis present

## 2024-04-17 DIAGNOSIS — I10 Essential (primary) hypertension: Secondary | ICD-10-CM

## 2024-04-17 DIAGNOSIS — Z825 Family history of asthma and other chronic lower respiratory diseases: Secondary | ICD-10-CM

## 2024-04-17 DIAGNOSIS — Z87440 Personal history of urinary (tract) infections: Secondary | ICD-10-CM

## 2024-04-17 DIAGNOSIS — E039 Hypothyroidism, unspecified: Secondary | ICD-10-CM | POA: Diagnosis present

## 2024-04-17 DIAGNOSIS — Z91048 Other nonmedicinal substance allergy status: Secondary | ICD-10-CM

## 2024-04-17 DIAGNOSIS — G4733 Obstructive sleep apnea (adult) (pediatric): Secondary | ICD-10-CM | POA: Diagnosis present

## 2024-04-17 DIAGNOSIS — I5043 Acute on chronic combined systolic (congestive) and diastolic (congestive) heart failure: Secondary | ICD-10-CM | POA: Diagnosis present

## 2024-04-17 DIAGNOSIS — G4730 Sleep apnea, unspecified: Secondary | ICD-10-CM

## 2024-04-17 DIAGNOSIS — Z91199 Patient's noncompliance with other medical treatment and regimen due to unspecified reason: Secondary | ICD-10-CM

## 2024-04-17 DIAGNOSIS — G2581 Restless legs syndrome: Secondary | ICD-10-CM | POA: Diagnosis present

## 2024-04-17 DIAGNOSIS — J40 Bronchitis, not specified as acute or chronic: Secondary | ICD-10-CM | POA: Diagnosis present

## 2024-04-17 DIAGNOSIS — Z79890 Hormone replacement therapy (postmenopausal): Secondary | ICD-10-CM

## 2024-04-17 DIAGNOSIS — Z23 Encounter for immunization: Secondary | ICD-10-CM

## 2024-04-17 LAB — BASIC METABOLIC PANEL WITH GFR
Anion gap: 10 (ref 5–15)
BUN: 33 mg/dL — ABNORMAL HIGH (ref 8–23)
CO2: 28 mmol/L (ref 22–32)
Calcium: 9.1 mg/dL (ref 8.9–10.3)
Chloride: 102 mmol/L (ref 98–111)
Creatinine, Ser: 0.78 mg/dL (ref 0.44–1.00)
GFR, Estimated: >60 mL/min (ref >60)
Glucose, Bld: 152 mg/dL — ABNORMAL HIGH (ref 70–99)
Potassium: 4.1 mmol/L (ref 3.5–5.1)
Sodium: 139 mmol/L (ref 135–145)

## 2024-04-17 LAB — URINALYSIS, ROUTINE W REFLEX MICROSCOPIC
Bilirubin Urine: NEGATIVE
Glucose, UA: NEGATIVE mg/dL
Hgb urine dipstick: NEGATIVE
Ketones, ur: NEGATIVE mg/dL
Leukocytes,Ua: NEGATIVE
Nitrite: NEGATIVE
Protein, ur: NEGATIVE mg/dL
Specific Gravity, Urine: 1.018 (ref 1.005–1.030)
pH: 5 (ref 5.0–8.0)

## 2024-04-17 LAB — CBC
HCT: 34 % — ABNORMAL LOW (ref 36.0–46.0)
Hemoglobin: 10.7 g/dL — ABNORMAL LOW (ref 12.0–15.0)
MCH: 30.1 pg (ref 26.0–34.0)
MCHC: 31.5 g/dL (ref 30.0–36.0)
MCV: 95.5 fL (ref 80.0–100.0)
Platelets: 251 K/uL (ref 150–400)
RBC: 3.56 MIL/uL — ABNORMAL LOW (ref 3.87–5.11)
RDW: 16.1 % — ABNORMAL HIGH (ref 11.5–15.5)
WBC: 7.4 K/uL (ref 4.0–10.5)
nRBC: 0 % (ref 0.0–0.2)

## 2024-04-17 MED ORDER — DILTIAZEM HCL 25 MG/5ML IV SOLN
10.0000 mg | Freq: Once | INTRAVENOUS | Status: AC
  Administered 2024-04-17: 10 mg via INTRAVENOUS
  Filled 2024-04-17: qty 5

## 2024-04-17 MED ORDER — FUROSEMIDE 10 MG/ML IJ SOLN
40.0000 mg | Freq: Once | INTRAMUSCULAR | Status: AC
Start: 2024-04-17 — End: 2024-04-17
  Administered 2024-04-17: 40 mg via INTRAVENOUS
  Filled 2024-04-17: qty 4

## 2024-04-17 MED ORDER — DILTIAZEM HCL-DEXTROSE 125-5 MG/125ML-% IV SOLN (PREMIX)
5.0000 mg/h | INTRAVENOUS | Status: DC
  Administered 2024-04-17: 5 mg/h via INTRAVENOUS
  Administered 2024-04-18: 15 mg/h via INTRAVENOUS
  Filled 2024-04-17 (×2): qty 125

## 2024-04-17 MED ORDER — DILTIAZEM LOAD VIA INFUSION
15.0000 mg | Freq: Once | INTRAVENOUS | Status: AC
  Administered 2024-04-17: 15 mg via INTRAVENOUS
  Filled 2024-04-17: qty 15

## 2024-04-17 MED ORDER — INFLUENZA VAC SPLIT HIGH-DOSE 0.5 ML IM SUSY
0.5000 mL | PREFILLED_SYRINGE | INTRAMUSCULAR | Status: AC
  Administered 2024-04-18: 0.5 mL via INTRAMUSCULAR
  Filled 2024-04-17: qty 0.5

## 2024-04-17 MED ORDER — ORAL CARE MOUTH RINSE: 15.0000 mL | OROMUCOSAL | Status: DC | PRN

## 2024-04-17 NOTE — ED Notes (Signed)
 Discussed transferring patient from fast track to main ED for central cardiac monitoring. PA declines at this time. Patient is on cardiac monitoring in the room.

## 2024-04-17 NOTE — Telephone Encounter (Signed)
 Pt c/o swelling/edema: STAT if pt has developed SOB within 24 hours  If swelling, where is the swelling located? Feet and ankles, wrists   How much weight have you gained and in what time span? 3 pounds   Have you gained 2 pounds in a day or 5 pounds in a week? Yes   Do you have a log of your daily weights (if so, list)? Friday 198, Saturday 202 IBS, Sunday 202, Monday 201   Are you currently taking a fluid pill? Yes   Are you currently SOB? When up and moving   Have you traveled recently in a car or plane for an extended period of time? No   Pt had swelling start on Saturday. She has been taking furosemide , but still has edema. Pt feels short of breath when she is up and moving. Please advise. Call transferred to triage.

## 2024-04-17 NOTE — Telephone Encounter (Signed)
 Pt c/o swelling/edema: STAT if pt has developed SOB within 24 hours   If swelling, where is the swelling located? Feet and ankles, wrists    How much weight have you gained and in what time span? 3 pounds    Have you gained 2 pounds in a day or 5 pounds in a week? Yes    Do you have a log of your daily weights (if so, list)? Friday 198, Saturday 202 IBS, Sunday 202, Monday 201    Are you currently taking a fluid pill? Yes    Are you currently SOB? When up and moving    Have you traveled recently in a car or plane for an extended period of time? No    Pt had swelling start on Saturday. She has been taking furosemide , but still has edema. Pt feels short of breath when she is up and moving. Please advise. Call transferred to triage.           I spoke with the patient and she stated that she is having shortness of breath, has swelling in her feet and legs, and feels chest, back and shoulder discomfort. She is taking her Lasix  20 mg as needed for the past few days but it does not seem to be helping. I advised her to go to the ED and be seen. She agreed and stated she will head there as soon as she can.

## 2024-04-17 NOTE — ED Provider Notes (Signed)
 [place] EMERGENCY DEPARTMENT AT [facility] Provider Note   CSN: [id number] Arrival date & time: 04/17/24  1147     Patient presents with: Leg Swelling   Emily [name] is a 73 y.o. female with a history significant for type 2 diabetes, hypertension, atrial fibrillation with Watchman in place, peripheral vascular disease, and aortic stenosis, patient scheduled to see Dr. Wonda tomorrow to discuss TAVR presenting for evaluation of left lower extremity increased edema.  She denies pain in the extremity, describing tight sensation.  She denies history of hypercoagulability, she also denies shortness of breath, orthopnea beyond her baseline.  She was recently started on low-dose amiodarone.  She also had an increase in her diltiazem  to 240 mg every morning with a prescription for 30 mg to take nightly as needed for increased edema.  She denies palpitations, chest pain at this time.  Additionally states she is only urinating small amounts of urine, but feels she bladder fullness.  Denies dysuria. She does state she has had no PO intake since early this morning.   The history is provided by the patient.       Prior to Admission medications   Medication Sig Start Date End Date Taking? Authorizing Provider  amiodarone (PACERONE) 200 MG tablet Take 1 tablet (200 mg total) by mouth daily. 04/04/24   Arlon Carliss ORN, DO  bisacodyl  (DULCOLAX) 5 MG EC tablet Take 5 mg by mouth daily as needed (constipation).    [provider]  clopidogrel  (PLAVIX ) 75 MG tablet TAKE 1 TABLET BY MOUTH EVERY DAY 08/02/23   Bethanie Cough, PA-C  diltiazem  (CARDIZEM ) 30 MG tablet Take 1 tablet (30 mg total) by mouth 3 (three) times daily as needed (afib). 01/14/24   Meng, Hao, PA  diltiazem  (CARTIA  XT) 240 MG 24 hr capsule Take 1 capsule (240 mg total) by mouth daily. 04/04/24 04/04/25  Arlon Carliss ORN, DO  diphenhydramine -acetaminophen  (TYLENOL  PM) 25-500 MG TABS tablet Take 1 tablet by mouth at  bedtime as needed (Pain).    [provider]  ezetimibe  (ZETIA ) 10 MG tablet TAKE 1 TABLET BY MOUTH EVERY DAY 01/06/24   Lavona Agent, MD  fluticasone (FLONASE) 50 MCG/ACT nasal spray Place 2 sprays into both nostrils daily. 04/12/24   Jolinda Norene HERO, DO  furosemide  (LASIX ) 20 MG tablet Take 1 tablet (20 mg total) by mouth daily as needed for fluid. 05/18/23 03/07/26  Jolinda Norene HERO, DO  gabapentin  (NEURONTIN ) 300 MG capsule TAKE 1 CAPSULE (300 MG TOTAL) BY MOUTH 2 (TWO) TIMES DAILY AS NEEDED (NEUROPATHY RIGHT LEG). 01/03/24   Jolinda Norene HERO, DO  glipiZIDE  (GLUCOTROL ) 5 MG tablet Take 1 tablet (5 mg total) by mouth daily. 04/12/24   Jolinda Norene HERO, DO  levonorgestrel (MIRENA) 20 MCG/DAY IUD 1 each by Intrauterine route once.    [provider]  levothyroxine  (SYNTHROID ) 112 MCG tablet TAKE 1 TABLET BY MOUTH EVERY OTHER DAY, ALTERNATING WITH 1 & 1/2 TABLETS EVERY OTHER DAY 01/03/24   Jolinda Norene M, DO  linaclotide  (LINZESS ) 290 MCG CAPS capsule Take 1 capsule (290 mcg total) by mouth daily before breakfast. Patient not taking: Reported on 04/12/2024 03/14/24   Jolinda Norene HERO, DO  losartan  (COZAAR ) 25 MG tablet TAKE 1 TABLET (25 MG TOTAL) BY MOUTH DAILY. 01/06/24   Lavona Agent, MD  lovastatin  (MEVACOR ) 40 MG tablet TAKE 1 TABLET BY MOUTH EVERY DAY IN THE EVENING 02/23/24   Lavona Agent, MD  metFORMIN  (GLUCOPHAGE ) 1000 MG tablet Take 1  tablet (1,000 mg total) by mouth 2 (two) times daily with a meal. Patient taking differently: Take 500 mg by mouth 2 (two) times daily with a meal. 03/02/23   Jolinda Potter M, DO  metoprolol  succinate (TOPROL -XL) 50 MG 24 hr tablet Take 1 tablet (50 mg total) by mouth at bedtime. Hold if systolic BP  less than 100 or HR less than 50 bpm 06/03/23   West, Katlyn D, NP  polyethylene glycol (MIRALAX  / GLYCOLAX ) 17 g packet Take 17 g by mouth daily as needed for moderate constipation.    [provider]  psyllium  (METAMUCIL) 58.6 % packet Take 1 packet by mouth daily as needed (Constipation).    [provider]  rOPINIRole  (REQUIP ) 0.5 MG tablet TAKE 1 TABLET BY MOUTH AT BEDTIME. 04/11/24   Gottschalk, Potter M, DO  Semaglutide ,0.25 or 0.5MG /DOS, 2 MG/3ML SOPN Inject 0.5 mg into the skin once a week. Patient taking differently: Inject 0.25-0.5 mg into the skin once a week. 06/09/22   Jolinda Potter HERO, DO  triamcinolone  cream (KENALOG ) 0.1 % Apply 1 Application topically 2 (two) times daily. X10 days as needed for rash Patient not taking: Reported on 04/12/2024 07/13/23   Jolinda Potter HERO, DO    Allergies: Jardiance  [empagliflozin ], Quinapril hcl, Statins, and Tape    Review of Systems  Constitutional:  Negative for fever.  HENT:  Negative for congestion and sore throat.   Eyes: Negative.   Respiratory:  Negative for cough, chest tightness and shortness of breath.   Cardiovascular:  Positive for leg swelling. Negative for chest pain and palpitations.  Gastrointestinal:  Negative for abdominal pain and nausea.  Genitourinary:  Positive for difficulty urinating.  Musculoskeletal:  Negative for arthralgias, joint swelling and neck pain.  Skin: Negative.  Negative for rash and wound.  Neurological:  Negative for dizziness, weakness, light-headedness, numbness and headaches.  Psychiatric/Behavioral: Negative.      Updated Vital Signs BP 119/87 (BP Location: Right Arm)   Pulse (!) 124   Temp 98 F (36.7 C) (Oral)   Resp 14   Ht 5' 2 (1.575 m)   Wt 91.2 kg   SpO2 96%   BMI 36.76 kg/m   Physical Exam Vitals and nursing note reviewed.  Constitutional:      Appearance: She is well-developed.  HENT:     Head: Normocephalic and atraumatic.  Eyes:     Conjunctiva/sclera: Conjunctivae normal.  Cardiovascular:     Rate and Rhythm: Tachycardia present. Rhythm irregular.     Heart sounds: Murmur heard.     Systolic murmur is present.  Pulmonary:     Effort: Pulmonary effort is  normal.     Breath sounds: Normal breath sounds. No wheezing.  Abdominal:     General: Bowel sounds are normal.     Palpations: Abdomen is soft.     Tenderness: There is no abdominal tenderness.  Musculoskeletal:        General: Normal range of motion.     Cervical back: Normal range of motion.     Right lower leg: Edema present.     Left lower leg: Edema present.     Comments: Bilateral lower extremity edema, left leg is significantly larger than the right.  She has no calf tenderness, no palpable cords, negative Homans' sign.  Skin:    General: Skin is warm and dry.  Neurological:     Mental Status: She is alert.     (all labs ordered are listed, but only  abnormal results are displayed) Labs Reviewed  BASIC METABOLIC PANEL WITH GFR - Abnormal; Notable for the following components:      Result Value   Glucose, Bld 152 (*)    BUN 33 (*)    All other components within normal limits  CBC - Abnormal; Notable for the following components:   RBC 3.56 (*)    Hemoglobin 10.7 (*)    HCT 34.0 (*)    RDW 16.1 (*)    All other components within normal limits  URINALYSIS, ROUTINE W REFLEX MICROSCOPIC - Abnormal; Notable for the following components:   APPearance HAZY (*)    All other components within normal limits    EKG: EKG Interpretation Date/Time:  Monday April 17 2024 12:02:39 EST Ventricular Rate:  110 PR Interval:    QRS Duration:  150 QT Interval:  390 QTC Calculation: 527 R Axis:   -63  Text Interpretation: Atrial fibrillation with rapid ventricular response Left axis deviation Right bundle branch block Left ventricular hypertrophy with repolarization abnormality ( R in aVL , Romhilt-Estes ) Abnormal ECG When compared with ECG of 07-Apr-2024 16:06, No significant change was found Confirmed by Towana Sharper [phone]) on 04/17/2024 3:49:17 PM  Radiology: US  Venous Img Lower Bilateral Result Date: 04/17/2024 EXAM: ULTRASOUND DUPLEX OF THE BILATERAL LOWER EXTREMITY  VEINS TECHNIQUE: Duplex ultrasound using B-mode/gray scaled imaging and Doppler spectral analysis and color flow was obtained of the deep venous structures of the bilateral lower extremity. COMPARISON: 02/23/2023 and previous. CLINICAL HISTORY: swelling FINDINGS: LEFT: The common femoral vein, femoral vein, popliteal vein, and posterior tibial vein of the left lower extremity demonstrate normal compressibility with normal color flow and spectral analysis. RIGHT: The common femoral vein, femoral vein, popliteal vein, and posterior tibial vein of the right lower extremity demonstrate normal compressibility with normal color flow and spectral analysis. IMPRESSION: 1. No evidence of deep venous thrombosis (DVT). Electronically signed by: Katheleen Faes MD 04/17/2024 05:06 PM EST RP Workstation: [id number]   DG Chest 2 View Result Date: 04/17/2024 CLINICAL DATA:  Congestive heart failure EXAM: CHEST - 2 VIEW COMPARISON:  Chest radiograph dated 04/06/2024 FINDINGS: Surgical clips project over the right apex. Normal lung volumes. No focal consolidations. Trace blunting of bilateral costophrenic angles. No pneumothorax. Enlarged cardiomediastinal silhouette is likely projectional. Septal occlusion device in-situ. No acute osseous abnormality. IMPRESSION: Trace blunting of bilateral costophrenic angles, which may represent trace pleural effusions. Electronically Signed   By: Limin  Xu M.D.   On: 04/17/2024 13:32     Procedures   Medications Ordered in the ED  furosemide  (LASIX ) injection 40 mg (has no administration in time range)  diltiazem  (CARDIZEM ) injection 10 mg (has no administration in time range)                                    Medical Decision Making Patient presenting secondary to unilateral lower extremity edema, she has known A-fib with Watchman, she is on Plavix  but no anticoagulants.  Presents with complaint of reduced urination as well but has had little p.o. fluid intake today as she  spent the majority of her day here.  She has no increased shortness of breath from her baseline.  Denies chest pain, no pleuritic symptoms, cough, fevers.  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  She  was given diltiazem  IV, DVT study left lower extremity negative for DVT.  Patient was given IV Lasix  for diuresis.  With urinary complaint, postvoid residual was also ordered.  Patient has appointment with Dr. Wonda tomorrow to discuss aortic valve surgery.  If patient adequately diuresis and she response to diltiazem , she may be stable enough for discharge home with close follow-up with cardiology tomorrow.  Patient was discussed with Megan Lloyd, PA who assumes care for patient.  Amount and/or Complexity of Data Reviewed Labs: ordered.    Details: Labs reviewed including NP met significant for glucose of 152, her BUN is elevated at 33 but she has a normal creatinine at 0.78, her electrolytes are normal.  Her CBC significant for hemoglobin of 10.7, urinalysis is negative for UTI. Radiology: ordered.    Details: Chest x-ray reviewed, possible trace pleural effusions.  Ultrasound bilateral lower extremity negative for DVT. ECG/medicine tests: ordered.    Details: A-fib with RVR, rate 110.  Right bundle branch block.  Risk Prescription drug management.        Final diagnoses:  None    ED Discharge Orders     None          Birdena Mliss RIGGERS 04/17/24 1746    Towana Ozell BROCKS, MD 04/18/24 [phone]

## 2024-04-17 NOTE — ED Triage Notes (Signed)
 Patient BIB [facility] from home. Patient takes 20mg  of laxis spoke with PCP and recommended to come to hospital for IV Laxis has edema to left leg. A&O x4. BP 112/68, pulse 80-108 room air 98%. Hx of afib. Denises SOB.

## 2024-04-17 NOTE — ED Provider Notes (Signed)
 Handoff given by Idol PA-C --   Patient was brought in by EMS from home for increased edema to left lower extremity   Venous bilateral ultrasound showed no acute findings.  Patient has been in and out of A-fib RVR - diltiazem  and furosemide  administered and the plan communicated is to reassess after medication administration, and likely send home.  Physical Exam  BP 119/87 (BP Location: Right Arm)   Pulse (!) 124   Temp 98 F (36.7 C) (Oral)   Resp 14   Ht 5' 2 (1.575 m)   Wt 91.2 kg   SpO2 96%   BMI 36.76 kg/m   Physical Exam Vitals and nursing note reviewed.  Constitutional:      General: She is not in acute distress.    Appearance: Normal appearance.  HENT:     Head: Normocephalic and atraumatic.  Eyes:     Extraocular Movements: Extraocular movements intact.     Conjunctiva/sclera: Conjunctivae normal.     Pupils: Pupils are equal, round, and reactive to light.  Cardiovascular:     Rate and Rhythm: Tachycardia present. Rhythm irregularly irregular.     Pulses: Normal pulses.          Radial pulses are 2+ on the right side and 2+ on the left side.  Pulmonary:     Effort: Pulmonary effort is normal. No respiratory distress.     Comments: Patient has no difficulty speaking complete sentences Abdominal:     General: Abdomen is flat.     Palpations: Abdomen is soft.     Tenderness: There is no abdominal tenderness.  Musculoskeletal:        General: Normal range of motion.     Cervical back: Normal range of motion.     Right lower leg: 2+ Pitting Edema present.     Left lower leg: 3+ Pitting Edema present.  Skin:    General: Skin is warm and dry.     Capillary Refill: Capillary refill takes less than 2 seconds.  Neurological:     General: No focal deficit present.     Mental Status: She is alert. Mental status is at baseline.  Psychiatric:        Mood and Affect: Mood normal.     Procedures  Procedures  ED Course / MDM   Clinical Course as of 04/17/24 1938   Mon Apr 17, 2024  1800 ED EKG [ML]  4570 73 year old female with history of paroxysmal A-fib here with leg swelling and shortness of breath.  Initially heart rate was not elevated but currently in A-fib 130 despite IV Cardizem .  She is awake alert no distress.  She will need input from cardiology as she recently came off of flecainide  and was put on amiodarone.  Disposition per results testing. [MB]    Clinical Course User Index [MB] Towana Ozell BROCKS, MD [ML] Willma Duwaine CR[place], [place]   Medical Decision Making Amount and/or Complexity of Data Reviewed Labs: ordered. Radiology: ordered. ECG/medicine tests:  Decision-making details documented in ED Course.  Risk Prescription drug management. Decision regarding hospitalization.   Patient presents to the ED for concern of LE edema and paroxsymal atrial fibrillation, this involves an extensive number of treatment options, and is a complaint that carries with it a high risk of complications and morbidity.    Cardiac Monitoring: The patient was maintained on a cardiac monitor.  I personally viewed and interpreted the cardiac monitored which showed an underlying rhythm of: Atrial Fibrillation RVR  Medicines ordered and prescription drug management: I ordered medications: Diltiazem  drip for Afib RVR  Reevaluation of the patient after these medicines showed that the patient stayed the same I have reviewed the patients home medicines and have made adjustments as needed  Consultations Obtained:   Consultation was obtained from cardiology regarding patients continued atrial fibrillation RVR at a rate of 130 despite initial cardizem  bolus. The consultant was contacted by phone and the case was discussed in detail, including relevant history, physical findings, and diagnostic results. The consultant recommended admission due to persistent atrial fibrillation despite cardizem  administration. The internal medicine was contacted after cardiology consult  for patient admission for further care and evaluation.   Dispostion: Patient will be admitted to internal medicine service, with cardiology following for further evaluation and care.      Willma Duwaine CR[place], [place] 04/17/24 2050    Towana Ozell BROCKS, MD 04/18/24 [phone]

## 2024-04-17 NOTE — H&P (Incomplete)
 History and Physical    Patient: Emily [name] FMW:[id number] DOB: November 22, 1950 DOA: 04/17/2024 DOS: the patient was seen and examined on 04/17/2024 PCP: Jolinda Norene HERO, DO  Patient coming from: Home  Chief Complaint:  Chief Complaint  Patient presents with  . Leg Swelling   HPI: Emily [name] is a 73 y.o. female with medical history significant of hypertension, hyperlipidemia, T2DM, chronic atrial fibrillation with Watchman device in place, acquired hypothyroidism, type 2 diabetes mellitus with hyperglycemia who presents to the emergency department via EMS due to left lower extremity edema.  Patient was recently started on amiodarone and diltiazem  was recently increased to 240 mg daily with 30 mg 3 times daily as needed.  Patient states that for several years, she has always intermittently go in and out of A-fib with RVR.  She states that she has an appointment with Dr. Wonda tomorrow to discuss TAVR.  Patient denies chest pain, shortness of breath, fever, chills, nausea, vomiting.  ED course In the emergency department, she was tachycardic, BP was 129/113, other vital signs were within normal range.  Workup in the ED showed normocytic anemia, BMP was normal except for blood glucose of 152 and BUN 33.  Urinalysis was normal. Bilateral lower extremity ultrasound showed no evidence of DVT Chest x-ray showed trace blunting of bilateral costophrenic angles, which may represent trace pleural effusion She was started treated with IV Cardizem  10 mg IV x 1 and patient was started on IV Cardizem  drip.  IV Lasix  40 mg x 1 was given.    Review of Systems: As mentioned in the history of present illness. All other systems reviewed and are negative. Past Medical History:  Diagnosis Date  . Anemia   . Arthritis   . Atrial fibrillation, rapid (HCC) 10/27/2013  . Bilateral carotid artery disease   . Complication of anesthesia    difficult intubation in past  . Diabetes mellitus without  complication (HCC)    type 2  . Difficult intubation    states 'lady that did the sleep study told me I have the smallest airway she has ever seen in an adult  . Dyslipidemia 06/29/2017  . Heart murmur   . History of blood transfusion 09/2019   GI bleed - in [place]  . Hypertension   . Hypothyroid   . Obesity   . Obstructive sleep apnea    occasional uses CPAP  . Peripheral vascular disease   . Persistent atrial fibrillation (HCC) 01/16/2020  . 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 - xrays were negative  . Upper GI bleed 09/25/2019   Past Surgical History:  Procedure Laterality Date  . CATARACT EXTRACTION Left   . CATARACT EXTRACTION W/PHACO Right 12/09/2015   Procedure: CATARACT EXTRACTION PHACO AND INTRAOCULAR LENS PLACEMENT (IOC);  Surgeon: Cherene Mania, MD;  Location: [facility];  Service: Ophthalmology;  Laterality: Right;  CDE:10.48  . COLONOSCOPY W/ POLYPECTOMY    . cyst on back of neck removed    . DILATION AND CURETTAGE OF UTERUS     x 5  . ENDARTERECTOMY Left 08/30/2015   Procedure: LEFT CAROYID ENDARTERECTOMY WITH XENOSURE BOVINE PERICARDIUM PATCH ANGIOPLASTY;  Surgeon: Gaile LELON New, MD;  Location: [facility] OR;  Service: Vascular;  Laterality: Left;  . TONSILLECTOMY    . TRANSCAROTID ARTERY REVASCULARIZATION  Right 03/19/2023   Procedure: Right Transcarotid Artery Revascularization;  Surgeon: New Gaile LELON, MD;  Location: [facility] OR;  Service: Vascular;  Laterality: Right;  . ULTRASOUND GUIDANCE FOR  VASCULAR ACCESS Left 03/19/2023   Procedure: ULTRASOUND GUIDANCE FOR VASCULAR ACCESS;  Surgeon: Serene Gaile ORN, MD;  Location: [facility] OR;  Service: Vascular;  Laterality: Left;  . UPPER GI ENDOSCOPY     x several   Social History:  reports that she has never smoked. She has never been exposed to tobacco smoke. She has never used smokeless tobacco. She reports that she does not drink alcohol and does not use drugs.  Allergies  Allergen Reactions  . Jardiance   [Empagliflozin ] Other (See Comments)    RECURRENT VAGINITIS  . Quinapril Hcl Cough  . Statins Other (See Comments)    Not all Statins but some cause cough and pain in legs.  . Tape Other (See Comments)    Redness, please use paper tape    Family History  Problem Relation Age of Onset  . COPD Father   . Heart failure Father   . Heart disease Father   . Emphysema Father   . Arrhythmia Sister   . Arrhythmia Sister   . Arrhythmia Sister        had PPM also  . Cancer Sister     Prior to Admission medications   Medication Sig Start Date End Date Taking? Authorizing Provider  amiodarone (PACERONE) 200 MG tablet Take 1 tablet (200 mg total) by mouth daily. 04/04/24   Arlon Carliss ORN, DO  bisacodyl  (DULCOLAX) 5 MG EC tablet Take 5 mg by mouth daily as needed (constipation).    [provider]  clopidogrel  (PLAVIX ) 75 MG tablet TAKE 1 TABLET BY MOUTH EVERY DAY 08/02/23   Bethanie Cough, PA-C  diltiazem  (CARDIZEM ) 30 MG tablet Take 1 tablet (30 mg total) by mouth 3 (three) times daily as needed (afib). 01/14/24   Meng, Hao, PA  diltiazem  (CARTIA  XT) 240 MG 24 hr capsule Take 1 capsule (240 mg total) by mouth daily. 04/04/24 04/04/25  Arlon Carliss ORN, DO  diphenhydramine -acetaminophen  (TYLENOL  PM) 25-500 MG TABS tablet Take 1 tablet by mouth at bedtime as needed (Pain).    [provider]  ezetimibe  (ZETIA ) 10 MG tablet TAKE 1 TABLET BY MOUTH EVERY DAY 01/06/24   Lavona Agent, MD  fluticasone (FLONASE) 50 MCG/ACT nasal spray Place 2 sprays into both nostrils daily. 04/12/24   Jolinda Norene HERO, DO  furosemide  (LASIX ) 20 MG tablet Take 1 tablet (20 mg total) by mouth daily as needed for fluid. 05/18/23 03/07/26  Jolinda Norene HERO, DO  gabapentin  (NEURONTIN ) 300 MG capsule TAKE 1 CAPSULE (300 MG TOTAL) BY MOUTH 2 (TWO) TIMES DAILY AS NEEDED (NEUROPATHY RIGHT LEG). 01/03/24   Jolinda Norene HERO, DO  glipiZIDE  (GLUCOTROL ) 5 MG tablet Take 1 tablet (5 mg total) by mouth daily.  04/12/24   Jolinda Norene HERO, DO  levonorgestrel (MIRENA) 20 MCG/DAY IUD 1 each by Intrauterine route once.    [provider]  levothyroxine  (SYNTHROID ) 112 MCG tablet TAKE 1 TABLET BY MOUTH EVERY OTHER DAY, ALTERNATING WITH 1 & 1/2 TABLETS EVERY OTHER DAY 01/03/24   Jolinda Norene M, DO  linaclotide  (LINZESS ) 290 MCG CAPS capsule Take 1 capsule (290 mcg total) by mouth daily before breakfast. Patient not taking: Reported on 04/12/2024 03/14/24   Jolinda Norene HERO, DO  losartan  (COZAAR ) 25 MG tablet TAKE 1 TABLET (25 MG TOTAL) BY MOUTH DAILY. 01/06/24   Lavona Agent, MD  lovastatin  (MEVACOR ) 40 MG tablet TAKE 1 TABLET BY MOUTH EVERY DAY IN THE EVENING 02/23/24   Lavona Agent, MD  metFORMIN  (GLUCOPHAGE ) 1000 MG tablet  Take 1 tablet (1,000 mg total) by mouth 2 (two) times daily with a meal. Patient taking differently: Take 500 mg by mouth 2 (two) times daily with a meal. 03/02/23   Gottschalk, Norene M, DO  metoprolol  succinate (TOPROL -XL) 50 MG 24 hr tablet Take 1 tablet (50 mg total) by mouth at bedtime. Hold if systolic BP  less than 100 or HR less than 50 bpm 06/03/23   West, Katlyn D, NP  polyethylene glycol (MIRALAX  / GLYCOLAX ) 17 g packet Take 17 g by mouth daily as needed for moderate constipation.    [provider]  psyllium (METAMUCIL) 58.6 % packet Take 1 packet by mouth daily as needed (Constipation).    [provider]  rOPINIRole  (REQUIP ) 0.5 MG tablet TAKE 1 TABLET BY MOUTH AT BEDTIME. 04/11/24   Jolinda Norene M, DO  Semaglutide ,0.25 or 0.5MG /DOS, 2 MG/3ML SOPN Inject 0.5 mg into the skin once a week. Patient taking differently: Inject 0.25-0.5 mg into the skin once a week. 06/09/22   Jolinda Norene HERO, DO  triamcinolone  cream (KENALOG ) 0.1 % Apply 1 Application topically 2 (two) times daily. X10 days as needed for rash Patient not taking: Reported on 04/12/2024 07/13/23   Jolinda Norene HERO, DO    Physical Exam: Vitals:   04/17/24 1745 04/17/24  1900 04/17/24 1939 04/17/24 2008  BP: (!) 129/113 (!) 121/96 (!) 131/98 (!) 143/68  Pulse: (!) 134   (!) 134  Resp: 15 19 (!) 25 (!) 27  Temp:      TempSrc:      SpO2: 95%  94% 92%  Weight:      Height:       General: Elderly female. Awake and alert and oriented x3. Not in any acute distress.  HEENT: NCAT.  PERRLA. EOMI. Sclerae anicteric.  Moist mucosal membranes. Neck: Neck supple without lymphadenopathy. No carotid bruits. No masses palpated.  Cardiovascular: Regular rate with normal S1-S2 sounds. No murmurs, rubs or gallops auscultated. No JVD.  Respiratory: Clear breath sounds.  No accessory muscle use. Abdomen: Soft, nontender, nondistended. Active bowel sounds. No masses or hepatosplenomegaly  Skin: No rashes, lesions, or ulcerations.  Dry, warm to touch. Musculoskeletal:  2+ dorsalis pedis and radial pulses. Good ROM.  No contractures  Psychiatric: Intact judgment and insight.  Mood appropriate to current condition. Neurologic: No focal neurological deficits. Strength is 5/5 x 4.  CN II - XII grossly intact.  Data Reviewed: Atrial fibrillation with RVR and RBBB, IVCD with repolarization abnormality and QTc of 541 ms  Assessment and Plan: Paroxysmal atrial fibrillation with RVR Continue IV Cardizem  drip with plan to transition to home oral meds when rate is better controlled Continue Plavix .  Patient states that she was taken off Eliquis  due to history of GI bleed  Bilateral pleural effusion Lower extremity edema    Advance Care Planning:   Code Status: Prior ***  Consults: ***  Family Communication: ***  Severity of Illness: {Observation/Inpatient:21159}  Author: Posey Maier, DO 04/17/2024 8:26 PM  For on call review [email or web address].

## 2024-04-17 NOTE — H&P (Incomplete)
 History and Physical    Patient: Emily [name] FMW:[id number] DOB: July 25, 1950 DOA: 04/17/2024 DOS: the patient was seen and examined on 04/17/2024 PCP: Jolinda Norene HERO, DO  Patient coming from: Home  Chief Complaint:  Chief Complaint  Patient presents with   Leg Swelling   HPI: Emily [name] is a 73 y.o. female with medical history significant of hypertension, hyperlipidemia, T2DM, chronic atrial fibrillation with Watchman device in place, acquired hypothyroidism, severe aortic stenosis, type 2 diabetes mellitus with hyperglycemia who presents to the emergency department via EMS due to left lower extremity edema.  Patient was recently started on amiodarone and diltiazem  was recently increased to 240 mg daily with 30 mg 3 times daily as needed.  Patient states that for several years, she has always intermittently go in and out of A-fib with RVR.  She states that she has an appointment with Dr. Wonda tomorrow to discuss TAVR.  Patient denies chest pain, shortness of breath, fever, chills, nausea, vomiting.  ED course In the emergency department, she was tachycardic, BP was 129/113, other vital signs were within normal range.  Workup in the ED showed normocytic anemia, BMP was normal except for blood glucose of 152 and BUN 33.  Urinalysis was normal. Bilateral lower extremity ultrasound showed no evidence of DVT Chest x-ray showed trace blunting of bilateral costophrenic angles, which may represent trace pleural effusion She was started treated with IV Cardizem  10 mg IV x 1 and patient was started on IV Cardizem  drip.  IV Lasix  40 mg x 1 was given.    Review of Systems: As mentioned in the history of present illness. All other systems reviewed and are negative. Past Medical History:  Diagnosis Date   Anemia    Arthritis    Atrial fibrillation, rapid (HCC) 10/27/2013   Bilateral carotid artery disease    Complication of anesthesia    difficult intubation in past   Diabetes  mellitus without complication (HCC)    type 2   Difficult intubation    states 'lady that did the sleep study told me I have the smallest airway she has ever seen in an adult   Dyslipidemia 06/29/2017   Heart murmur    History of blood transfusion 09/2019   GI bleed - in [place]   Hypertension    Hypothyroid    Obesity    Obstructive sleep apnea    occasional uses CPAP   Peripheral vascular disease    Persistent atrial fibrillation (HCC) 01/16/2020   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 - xrays were negative   Upper GI bleed 09/25/2019   Past Surgical History:  Procedure Laterality Date   CATARACT EXTRACTION Left    CATARACT EXTRACTION W/PHACO Right 12/09/2015   Procedure: CATARACT EXTRACTION PHACO AND INTRAOCULAR LENS PLACEMENT (IOC);  Surgeon: Cherene Mania, MD;  Location: [facility];  Service: Ophthalmology;  Laterality: Right;  CDE:10.48   COLONOSCOPY W/ POLYPECTOMY     cyst on back of neck removed     DILATION AND CURETTAGE OF UTERUS     x 5   ENDARTERECTOMY Left 08/30/2015   Procedure: LEFT CAROYID ENDARTERECTOMY WITH XENOSURE BOVINE PERICARDIUM PATCH ANGIOPLASTY;  Surgeon: Gaile LELON New, MD;  Location: [facility] OR;  Service: Vascular;  Laterality: Left;   TONSILLECTOMY     TRANSCAROTID ARTERY REVASCULARIZATION  Right 03/19/2023   Procedure: Right Transcarotid Artery Revascularization;  Surgeon: New Gaile LELON, MD;  Location: [facility] OR;  Service: Vascular;  Laterality: Right;  ULTRASOUND GUIDANCE FOR VASCULAR ACCESS Left 03/19/2023   Procedure: ULTRASOUND GUIDANCE FOR VASCULAR ACCESS;  Surgeon: Serene Gaile ORN, MD;  Location: [facility] OR;  Service: Vascular;  Laterality: Left;   UPPER GI ENDOSCOPY     x several   Social History:  reports that she has never smoked. She has never been exposed to tobacco smoke. She has never used smokeless tobacco. She reports that she does not drink alcohol and does not use drugs.  Allergies  Allergen Reactions   Jardiance   [Empagliflozin ] Other (See Comments)    RECURRENT VAGINITIS   Quinapril Hcl Cough   Statins Other (See Comments)    Not all Statins but some cause cough and pain in legs.   Tape Other (See Comments)    Redness, please use paper tape    Family History  Problem Relation Age of Onset   COPD Father    Heart failure Father    Heart disease Father    Emphysema Father    Arrhythmia Sister    Arrhythmia Sister    Arrhythmia Sister        had PPM also   Cancer Sister     Prior to Admission medications   Medication Sig Start Date End Date Taking? Authorizing Provider  amiodarone (PACERONE) 200 MG tablet Take 1 tablet (200 mg total) by mouth daily. 04/04/24   Arlon Carliss ORN, DO  bisacodyl  (DULCOLAX) 5 MG EC tablet Take 5 mg by mouth daily as needed (constipation).    [provider]  clopidogrel  (PLAVIX ) 75 MG tablet TAKE 1 TABLET BY MOUTH EVERY DAY 08/02/23   Bethanie Cough, PA-C  diltiazem  (CARDIZEM ) 30 MG tablet Take 1 tablet (30 mg total) by mouth 3 (three) times daily as needed (afib). 01/14/24   Meng, Hao, PA  diltiazem  (CARTIA  XT) 240 MG 24 hr capsule Take 1 capsule (240 mg total) by mouth daily. 04/04/24 04/04/25  Arlon Carliss ORN, DO  diphenhydramine -acetaminophen  (TYLENOL  PM) 25-500 MG TABS tablet Take 1 tablet by mouth at bedtime as needed (Pain).    [provider]  ezetimibe  (ZETIA ) 10 MG tablet TAKE 1 TABLET BY MOUTH EVERY DAY 01/06/24   Lavona Agent, MD  fluticasone (FLONASE) 50 MCG/ACT nasal spray Place 2 sprays into both nostrils daily. 04/12/24   Jolinda Norene HERO, DO  furosemide  (LASIX ) 20 MG tablet Take 1 tablet (20 mg total) by mouth daily as needed for fluid. 05/18/23 03/07/26  Jolinda Norene HERO, DO  gabapentin  (NEURONTIN ) 300 MG capsule TAKE 1 CAPSULE (300 MG TOTAL) BY MOUTH 2 (TWO) TIMES DAILY AS NEEDED (NEUROPATHY RIGHT LEG). 01/03/24   Jolinda Norene HERO, DO  glipiZIDE  (GLUCOTROL ) 5 MG tablet Take 1 tablet (5 mg total) by mouth daily. 04/12/24    Jolinda Norene HERO, DO  levonorgestrel (MIRENA) 20 MCG/DAY IUD 1 each by Intrauterine route once.    [provider]  levothyroxine  (SYNTHROID ) 112 MCG tablet TAKE 1 TABLET BY MOUTH EVERY OTHER DAY, ALTERNATING WITH 1 & 1/2 TABLETS EVERY OTHER DAY 01/03/24   Jolinda Norene M, DO  linaclotide  (LINZESS ) 290 MCG CAPS capsule Take 1 capsule (290 mcg total) by mouth daily before breakfast. Patient not taking: Reported on 04/12/2024 03/14/24   Jolinda Norene HERO, DO  losartan  (COZAAR ) 25 MG tablet TAKE 1 TABLET (25 MG TOTAL) BY MOUTH DAILY. 01/06/24   Lavona Agent, MD  lovastatin  (MEVACOR ) 40 MG tablet TAKE 1 TABLET BY MOUTH EVERY DAY IN THE EVENING 02/23/24   Lavona Agent, MD  metFORMIN  (GLUCOPHAGE )  1000 MG tablet Take 1 tablet (1,000 mg total) by mouth 2 (two) times daily with a meal. Patient taking differently: Take 500 mg by mouth 2 (two) times daily with a meal. 03/02/23   Jolinda Potter M, DO  metoprolol  succinate (TOPROL -XL) 50 MG 24 hr tablet Take 1 tablet (50 mg total) by mouth at bedtime. Hold if systolic BP  less than 100 or HR less than 50 bpm 06/03/23   West, Katlyn D, NP  polyethylene glycol (MIRALAX  / GLYCOLAX ) 17 g packet Take 17 g by mouth daily as needed for moderate constipation.    [provider]  psyllium (METAMUCIL) 58.6 % packet Take 1 packet by mouth daily as needed (Constipation).    [provider]  rOPINIRole  (REQUIP ) 0.5 MG tablet TAKE 1 TABLET BY MOUTH AT BEDTIME. 04/11/24   Jolinda Potter M, DO  Semaglutide ,0.25 or 0.5MG /DOS, 2 MG/3ML SOPN Inject 0.5 mg into the skin once a week. Patient taking differently: Inject 0.25-0.5 mg into the skin once a week. 06/09/22   Jolinda Potter HERO, DO  triamcinolone  cream (KENALOG ) 0.1 % Apply 1 Application topically 2 (two) times daily. X10 days as needed for rash Patient not taking: Reported on 04/12/2024 07/13/23   Jolinda Potter HERO, DO    Physical Exam: Vitals:   04/17/24 1745 04/17/24 1900  04/17/24 1939 04/17/24 2008  BP: (!) 129/113 (!) 121/96 (!) 131/98 (!) 143/68  Pulse: (!) 134   (!) 134  Resp: 15 19 (!) 25 (!) 27  Temp:      TempSrc:      SpO2: 95%  94% 92%  Weight:      Height:       General: Elderly female. Awake and alert and oriented x3. Not in any acute distress.  HEENT: NCAT.  PERRLA. EOMI. Sclerae anicteric.  Moist mucosal membranes. Neck: Neck supple without lymphadenopathy. No carotid bruits. No masses palpated.  Cardiovascular: Regular rate with normal S1-S2 sounds. No murmurs, rubs or gallops auscultated. No JVD.  Respiratory: Clear breath sounds.  No accessory muscle use. Abdomen: Soft, nontender, nondistended. Active bowel sounds. No masses or hepatosplenomegaly  Skin: No rashes, lesions, or ulcerations.  Dry, warm to touch. Musculoskeletal:  2+ dorsalis pedis and radial pulses. Good ROM.  No contractures  Psychiatric: Intact judgment and insight.  Mood appropriate to current condition. Neurologic: No focal neurological deficits. Strength is 5/5 x 4.  CN II - XII grossly intact.  Data Reviewed: Atrial fibrillation with RVR and RBBB, IVCD with repolarization abnormality and QTc of 541 ms  Assessment and Plan: Paroxysmal atrial fibrillation with RVR Continue IV Cardizem  drip with plan to transition to home oral meds when rate is better controlled Continue Plavix .  Patient states that she was taken off Eliquis  due to history of GI bleed  Bilateral pleural effusion Lower extremity edema Chest x-ray showed bilateral trace pleural effusions IV Lasix  40 mg x 1 was given, continue IV Lasix  40 mg twice daily (with holding parameters)  Prolonged QT interval QTc 441 ms Avoid QT prolonging drugs Magnesium  level will be checked Repeat EKG in the morning  Severe aortic stenosis Patient states that she has an appointment to see Dr. Wonda tomorrow (11/18) to discuss TAVR  Essential hypertension Continue IV Cardizem  drip and IV Lasix  40 mg twice daily at  this time Consider starting patient's home BP meds when weaned off Cardizem  drip   Mixed hyperlipidemia Continue statins, Zetia   Type II diabetes mellitus with hyperglycemia Last A1c on 11//24 was  5.5 which was in the nondiabetic range continue diet and lifestyle modification Semaglutide , glipizide  and metformin  will be held at this time  Sleep apnea Continue CPAP.  Restless leg syndrome  Continue Requip .  Acquired hypothyroidism Continue Synthroid       Advance Care Planning: Full code  Consults: None  Family Communication: None at bedside  Severity of Illness: The appropriate patient status for this patient is OBSERVATION. Observation status is judged to be reasonable and necessary in order to provide the required intensity of service to ensure the patient's safety. The patient's presenting symptoms, physical exam findings, and initial radiographic and laboratory data in the context of their medical condition is felt to place them at decreased risk for further clinical deterioration. Furthermore, it is anticipated that the patient will be medically stable for discharge from the hospital within 2 midnights of admission.   Author: Briana Farner, DO 04/17/2024 8:26 PM  For on call review [email or web address].

## 2024-04-17 NOTE — Plan of Care (Signed)
 This patient is admitted overnight to [facility]. The patient is AA+Ox4. Diltiazem  infusion per parameters. PIV access only. No supplemental O2 requirement at this time.   Problem: Education: Goal: Knowledge of General Education information will improve Description: Including pain rating scale, medication(s)/side effects and non-pharmacologic comfort measures Outcome: Progressing   Problem: Health Behavior/Discharge Planning: Goal: Ability to manage health-related needs will improve Outcome: Progressing   Problem: Clinical Measurements: Goal: Ability to maintain clinical measurements within normal limits will improve Outcome: Progressing Goal: Will remain free from infection Outcome: Progressing Goal: Diagnostic test results will improve Outcome: Progressing Goal: Respiratory complications will improve Outcome: Progressing Goal: Cardiovascular complication will be avoided Outcome: Progressing   Problem: Activity: Goal: Risk for activity intolerance will decrease Outcome: Progressing   Problem: Nutrition: Goal: Adequate nutrition will be maintained Outcome: Progressing   Problem: Coping: Goal: Level of anxiety will decrease Outcome: Progressing   Problem: Elimination: Goal: Will not experience complications related to bowel motility Outcome: Progressing Goal: Will not experience complications related to urinary retention Outcome: Progressing   Problem: Pain Managment: Goal: General experience of comfort will improve and/or be controlled Outcome: Progressing   Problem: Safety: Goal: Ability to remain free from injury will improve Outcome: Progressing   Problem: Skin Integrity: Goal: Risk for impaired skin integrity will decrease Outcome: Progressing   Problem: Education: Goal: Knowledge of disease or condition will improve Outcome: Progressing Goal: Understanding of medication regimen will improve Outcome: Progressing Goal: Individualized Educational  Video(s) Outcome: Progressing   Problem: Activity: Goal: Ability to tolerate increased activity will improve Outcome: Progressing   Problem: Cardiac: Goal: Ability to achieve and maintain adequate cardiopulmonary perfusion will improve Outcome: Progressing   Problem: Health Behavior/Discharge Planning: Goal: Ability to safely manage health-related needs after discharge will improve Outcome: Progressing   Problem: Education: Goal: Ability to describe self-care measures that may prevent or decrease complications (Diabetes Survival Skills Education) will improve Outcome: Progressing Goal: Individualized Educational Video(s) Outcome: Progressing   Problem: Coping: Goal: Ability to adjust to condition or change in health will improve Outcome: Progressing   Problem: Fluid Volume: Goal: Ability to maintain a balanced intake and output will improve Outcome: Progressing   Problem: Health Behavior/Discharge Planning: Goal: Ability to identify and utilize available resources and services will improve Outcome: Progressing Goal: Ability to manage health-related needs will improve Outcome: Progressing   Problem: Metabolic: Goal: Ability to maintain appropriate glucose levels will improve Outcome: Progressing   Problem: Nutritional: Goal: Maintenance of adequate nutrition will improve Outcome: Progressing Goal: Progress toward achieving an optimal weight will improve Outcome: Progressing   Problem: Skin Integrity: Goal: Risk for impaired skin integrity will decrease Outcome: Progressing   Problem: Tissue Perfusion: Goal: Adequacy of tissue perfusion will improve Outcome: Progressing

## 2024-04-18 ENCOUNTER — Ambulatory Visit: Admitting: Cardiovascular Disease

## 2024-04-18 ENCOUNTER — Inpatient Hospital Stay (HOSPITAL_COMMUNITY)

## 2024-04-18 ENCOUNTER — Telehealth: Payer: Self-pay | Admitting: Pharmacist

## 2024-04-18 DIAGNOSIS — I4819 Other persistent atrial fibrillation: Secondary | ICD-10-CM | POA: Diagnosis present

## 2024-04-18 DIAGNOSIS — E782 Mixed hyperlipidemia: Secondary | ICD-10-CM | POA: Diagnosis present

## 2024-04-18 DIAGNOSIS — J40 Bronchitis, not specified as acute or chronic: Secondary | ICD-10-CM | POA: Diagnosis present

## 2024-04-18 DIAGNOSIS — Z8249 Family history of ischemic heart disease and other diseases of the circulatory system: Secondary | ICD-10-CM | POA: Diagnosis not present

## 2024-04-18 DIAGNOSIS — Z7984 Long term (current) use of oral hypoglycemic drugs: Secondary | ICD-10-CM | POA: Diagnosis not present

## 2024-04-18 DIAGNOSIS — I5043 Acute on chronic combined systolic (congestive) and diastolic (congestive) heart failure: Secondary | ICD-10-CM | POA: Diagnosis present

## 2024-04-18 DIAGNOSIS — I25118 Atherosclerotic heart disease of native coronary artery with other forms of angina pectoris: Secondary | ICD-10-CM | POA: Diagnosis not present

## 2024-04-18 DIAGNOSIS — E1140 Type 2 diabetes mellitus with diabetic neuropathy, unspecified: Secondary | ICD-10-CM | POA: Diagnosis present

## 2024-04-18 DIAGNOSIS — I110 Hypertensive heart disease with heart failure: Secondary | ICD-10-CM | POA: Diagnosis present

## 2024-04-18 DIAGNOSIS — E1169 Type 2 diabetes mellitus with other specified complication: Secondary | ICD-10-CM

## 2024-04-18 DIAGNOSIS — Z7902 Long term (current) use of antithrombotics/antiplatelets: Secondary | ICD-10-CM | POA: Diagnosis not present

## 2024-04-18 DIAGNOSIS — I152 Hypertension secondary to endocrine disorders: Secondary | ICD-10-CM

## 2024-04-18 DIAGNOSIS — E119 Type 2 diabetes mellitus without complications: Secondary | ICD-10-CM

## 2024-04-18 DIAGNOSIS — F32A Depression, unspecified: Secondary | ICD-10-CM | POA: Diagnosis present

## 2024-04-18 DIAGNOSIS — F419 Anxiety disorder, unspecified: Secondary | ICD-10-CM | POA: Diagnosis present

## 2024-04-18 DIAGNOSIS — G4733 Obstructive sleep apnea (adult) (pediatric): Secondary | ICD-10-CM | POA: Diagnosis present

## 2024-04-18 DIAGNOSIS — F05 Delirium due to known physiological condition: Secondary | ICD-10-CM | POA: Diagnosis present

## 2024-04-18 DIAGNOSIS — E039 Hypothyroidism, unspecified: Secondary | ICD-10-CM | POA: Diagnosis present

## 2024-04-18 DIAGNOSIS — R600 Localized edema: Secondary | ICD-10-CM | POA: Diagnosis not present

## 2024-04-18 DIAGNOSIS — I480 Paroxysmal atrial fibrillation: Secondary | ICD-10-CM | POA: Diagnosis not present

## 2024-04-18 DIAGNOSIS — I350 Nonrheumatic aortic (valve) stenosis: Secondary | ICD-10-CM

## 2024-04-18 DIAGNOSIS — Z79899 Other long term (current) drug therapy: Secondary | ICD-10-CM | POA: Diagnosis not present

## 2024-04-18 DIAGNOSIS — E1159 Type 2 diabetes mellitus with other circulatory complications: Secondary | ICD-10-CM | POA: Diagnosis not present

## 2024-04-18 DIAGNOSIS — I2510 Atherosclerotic heart disease of native coronary artery without angina pectoris: Secondary | ICD-10-CM | POA: Diagnosis not present

## 2024-04-18 DIAGNOSIS — E66812 Obesity, class 2: Secondary | ICD-10-CM | POA: Diagnosis present

## 2024-04-18 DIAGNOSIS — I5033 Acute on chronic diastolic (congestive) heart failure: Secondary | ICD-10-CM | POA: Diagnosis not present

## 2024-04-18 DIAGNOSIS — Z6837 Body mass index (BMI) 37.0-37.9, adult: Secondary | ICD-10-CM | POA: Diagnosis not present

## 2024-04-18 DIAGNOSIS — Z7985 Long-term (current) use of injectable non-insulin antidiabetic drugs: Secondary | ICD-10-CM | POA: Diagnosis not present

## 2024-04-18 DIAGNOSIS — I428 Other cardiomyopathies: Secondary | ICD-10-CM | POA: Diagnosis present

## 2024-04-18 DIAGNOSIS — G720 Drug-induced myopathy: Secondary | ICD-10-CM | POA: Insufficient documentation

## 2024-04-18 DIAGNOSIS — Z79890 Hormone replacement therapy (postmenopausal): Secondary | ICD-10-CM | POA: Diagnosis not present

## 2024-04-18 DIAGNOSIS — Z23 Encounter for immunization: Secondary | ICD-10-CM | POA: Diagnosis present

## 2024-04-18 DIAGNOSIS — G2581 Restless legs syndrome: Secondary | ICD-10-CM | POA: Diagnosis present

## 2024-04-18 DIAGNOSIS — E1165 Type 2 diabetes mellitus with hyperglycemia: Secondary | ICD-10-CM | POA: Diagnosis present

## 2024-04-18 LAB — COMPREHENSIVE METABOLIC PANEL WITH GFR
ALT: 14 U/L (ref 0–44)
AST: 15 U/L (ref 15–41)
Albumin: 4 g/dL (ref 3.5–5.0)
Alkaline Phosphatase: 96 U/L (ref 38–126)
Anion gap: 11 (ref 5–15)
BUN: 29 mg/dL — ABNORMAL HIGH (ref 8–23)
CO2: 26 mmol/L (ref 22–32)
Calcium: 8.8 mg/dL — ABNORMAL LOW (ref 8.9–10.3)
Chloride: 102 mmol/L (ref 98–111)
Creatinine, Ser: 0.81 mg/dL (ref 0.44–1.00)
GFR, Estimated: >60 mL/min (ref >60)
Glucose, Bld: 159 mg/dL — ABNORMAL HIGH (ref 70–99)
Potassium: 3.5 mmol/L (ref 3.5–5.1)
Sodium: 140 mmol/L (ref 135–145)
Total Bilirubin: 0.8 mg/dL (ref 0.0–1.2)
Total Protein: 6.3 g/dL — ABNORMAL LOW (ref 6.5–8.1)

## 2024-04-18 LAB — CBC
HCT: 32.9 % — ABNORMAL LOW (ref 36.0–46.0)
Hemoglobin: 10.6 g/dL — ABNORMAL LOW (ref 12.0–15.0)
MCH: 30.4 pg (ref 26.0–34.0)
MCHC: 32.2 g/dL (ref 30.0–36.0)
MCV: 94.3 fL (ref 80.0–100.0)
Platelets: 234 K/uL (ref 150–400)
RBC: 3.49 MIL/uL — ABNORMAL LOW (ref 3.87–5.11)
RDW: 16 % — ABNORMAL HIGH (ref 11.5–15.5)
WBC: 8.4 K/uL (ref 4.0–10.5)
nRBC: 0 % (ref 0.0–0.2)

## 2024-04-18 LAB — ECHOCARDIOGRAM COMPLETE
AV Area VTI: 0.61 cm2
AV Mean grad: 27.5 mmHg
AV Peak grad: 42.6 mmHg
Ao pk vel: 3.26 m/s
Area-P 1/2: 2.51 cm2
Height: 62 in
S' Lateral: 3.5 cm
Weight: 3268.8 [oz_av]

## 2024-04-18 LAB — GLUCOSE, CAPILLARY
Glucose-Capillary: 261 mg/dL — ABNORMAL HIGH (ref 70–99)
Glucose-Capillary: 234 mg/dL — ABNORMAL HIGH (ref 70–99)
Glucose-Capillary: 167 mg/dL — ABNORMAL HIGH (ref 70–99)
Glucose-Capillary: 221 mg/dL — ABNORMAL HIGH (ref 70–99)

## 2024-04-18 LAB — PHOSPHORUS: Phosphorus: 3.7 mg/dL (ref 2.5–4.6)

## 2024-04-18 LAB — MRSA NEXT GEN BY PCR, NASAL: MRSA by PCR Next Gen: NOT DETECTED

## 2024-04-18 LAB — MAGNESIUM: Magnesium: 2.1 mg/dL (ref 1.7–2.4)

## 2024-04-18 MED ORDER — LORAZEPAM 2 MG/ML IJ SOLN
0.5000 mg | Freq: Four times a day (QID) | INTRAMUSCULAR | Status: DC | PRN
  Administered 2024-04-18 (×2): 0.5 mg via INTRAVENOUS
  Filled 2024-04-18 (×2): qty 1

## 2024-04-18 MED ORDER — ROPINIROLE HCL 0.5 MG PO TABS
0.5000 mg | ORAL_TABLET | Freq: Every day | ORAL | Status: DC
  Administered 2024-04-18 – 2024-04-24 (×7): 0.5 mg via ORAL
  Filled 2024-04-18 (×5): qty 1
  Filled 2024-04-18 (×2): qty 2

## 2024-04-18 MED ORDER — INSULIN ASPART 100 UNIT/ML IJ SOLN
0.0000 [IU] | Freq: Three times a day (TID) | INTRAMUSCULAR | Status: DC
  Administered 2024-04-18: 5 [IU] via SUBCUTANEOUS
  Administered 2024-04-18: 3 [IU] via SUBCUTANEOUS
  Administered 2024-04-19: 5 [IU] via SUBCUTANEOUS
  Administered 2024-04-19 (×2): 8 [IU] via SUBCUTANEOUS
  Administered 2024-04-20: 5 [IU] via SUBCUTANEOUS
  Administered 2024-04-20: 3 [IU] via SUBCUTANEOUS
  Administered 2024-04-20: 5 [IU] via SUBCUTANEOUS
  Administered 2024-04-21 (×2): 3 [IU] via SUBCUTANEOUS
  Administered 2024-04-21: 5 [IU] via SUBCUTANEOUS
  Administered 2024-04-22: 3 [IU] via SUBCUTANEOUS
  Administered 2024-04-22: 8 [IU] via SUBCUTANEOUS
  Administered 2024-04-22: 2 [IU] via SUBCUTANEOUS
  Administered 2024-04-23 (×2): 3 [IU] via SUBCUTANEOUS
  Administered 2024-04-23 – 2024-04-24 (×2): 2 [IU] via SUBCUTANEOUS
  Administered 2024-04-24: 5 [IU] via SUBCUTANEOUS
  Administered 2024-04-24: 3 [IU] via SUBCUTANEOUS
  Administered 2024-04-25: 2 [IU] via SUBCUTANEOUS
  Filled 2024-04-18: qty 1
  Filled 2024-04-18: qty 3
  Filled 2024-04-18 (×2): qty 2
  Filled 2024-04-18 (×2): qty 1
  Filled 2024-04-18 (×2): qty 3
  Filled 2024-04-18: qty 5
  Filled 2024-04-18: qty 2
  Filled 2024-04-18 (×2): qty 1
  Filled 2024-04-18: qty 8
  Filled 2024-04-18: qty 5
  Filled 2024-04-18: qty 2
  Filled 2024-04-18: qty 1
  Filled 2024-04-18 (×2): qty 3
  Filled 2024-04-18: qty 1
  Filled 2024-04-18: qty 3

## 2024-04-18 MED ORDER — PROCHLORPERAZINE EDISYLATE 10 MG/2ML IJ SOLN: 10.0000 mg | Freq: Four times a day (QID) | INTRAMUSCULAR | Status: DC | PRN

## 2024-04-18 MED ORDER — ACETAMINOPHEN 650 MG RE SUPP: 650.0000 mg | Freq: Four times a day (QID) | RECTAL | Status: DC | PRN

## 2024-04-18 MED ORDER — PERFLUTREN LIPID MICROSPHERE
1.0000 mL | INTRAVENOUS | Status: AC | PRN
  Administered 2024-04-18: 2 mL via INTRAVENOUS

## 2024-04-18 MED ORDER — METHYLPREDNISOLONE SODIUM SUCC 40 MG IJ SOLR
40.0000 mg | Freq: Once | INTRAMUSCULAR | Status: AC
  Administered 2024-04-18: 40 mg via INTRAVENOUS
  Filled 2024-04-18: qty 1

## 2024-04-18 MED ORDER — LEVOTHYROXINE SODIUM 112 MCG PO TABS
112.0000 ug | ORAL_TABLET | ORAL | Status: DC
  Administered 2024-04-18 – 2024-04-24 (×4): 112 ug via ORAL
  Filled 2024-04-18 (×4): qty 1

## 2024-04-18 MED ORDER — PRAVASTATIN SODIUM 40 MG PO TABS
40.0000 mg | ORAL_TABLET | Freq: Every day | ORAL | Status: DC
  Administered 2024-04-18 – 2024-04-24 (×7): 40 mg via ORAL
  Filled 2024-04-18 (×7): qty 1

## 2024-04-18 MED ORDER — INSULIN ASPART 100 UNIT/ML IJ SOLN
0.0000 [IU] | Freq: Every day | INTRAMUSCULAR | Status: DC
  Administered 2024-04-18 – 2024-04-20 (×2): 2 [IU] via SUBCUTANEOUS
  Filled 2024-04-18: qty 1
  Filled 2024-04-18: qty 2

## 2024-04-18 MED ORDER — AMIODARONE LOAD VIA INFUSION
150.0000 mg | Freq: Once | INTRAVENOUS | Status: AC
  Administered 2024-04-18: 150 mg via INTRAVENOUS
  Filled 2024-04-18: qty 83.34

## 2024-04-18 MED ORDER — IPRATROPIUM-ALBUTEROL 0.5-2.5 (3) MG/3ML IN SOLN: 3.0000 mL | RESPIRATORY_TRACT | Status: DC | PRN

## 2024-04-18 MED ORDER — METOPROLOL TARTRATE 5 MG/5ML IV SOLN
5.0000 mg | INTRAVENOUS | Status: AC | PRN
  Administered 2024-04-18 (×3): 5 mg via INTRAVENOUS
  Filled 2024-04-18 (×3): qty 5

## 2024-04-18 MED ORDER — AMIODARONE HCL IN DEXTROSE 360-4.14 MG/200ML-% IV SOLN
60.0000 mg/h | INTRAVENOUS | Status: DC
  Administered 2024-04-18 (×2): 60 mg/h via INTRAVENOUS
  Filled 2024-04-18 (×2): qty 200

## 2024-04-18 MED ORDER — CHLORHEXIDINE GLUCONATE CLOTH 2 % EX PADS
6.0000 | MEDICATED_PAD | Freq: Every day | CUTANEOUS | Status: DC
  Administered 2024-04-17 – 2024-04-20 (×4): 6 via TOPICAL

## 2024-04-18 MED ORDER — LEVOTHYROXINE SODIUM 112 MCG PO TABS
168.0000 ug | ORAL_TABLET | ORAL | Status: DC
  Administered 2024-04-19 – 2024-04-25 (×4): 168 ug via ORAL
  Filled 2024-04-18 (×4): qty 2

## 2024-04-18 MED ORDER — ACETAMINOPHEN 325 MG PO TABS
650.0000 mg | ORAL_TABLET | Freq: Four times a day (QID) | ORAL | Status: DC | PRN
  Administered 2024-04-18: 650 mg via ORAL
  Filled 2024-04-18: qty 2

## 2024-04-18 MED ORDER — LOSARTAN POTASSIUM 25 MG PO TABS: 25.0000 mg | ORAL_TABLET | Freq: Every day | ORAL | 2 refills | Status: DC

## 2024-04-18 MED ORDER — FUROSEMIDE 10 MG/ML IJ SOLN
40.0000 mg | Freq: Two times a day (BID) | INTRAMUSCULAR | Status: DC
  Administered 2024-04-18 – 2024-04-19 (×3): 40 mg via INTRAVENOUS
  Filled 2024-04-18 (×3): qty 4

## 2024-04-18 MED ORDER — AMIODARONE HCL IN DEXTROSE 360-4.14 MG/200ML-% IV SOLN
30.0000 mg/h | INTRAVENOUS | Status: DC
  Administered 2024-04-18 – 2024-04-20 (×4): 30 mg/h via INTRAVENOUS
  Filled 2024-04-18 (×4): qty 200

## 2024-04-18 MED ORDER — EZETIMIBE 10 MG PO TABS
10.0000 mg | ORAL_TABLET | Freq: Every day | ORAL | Status: DC
  Administered 2024-04-18 – 2024-04-25 (×8): 10 mg via ORAL
  Filled 2024-04-18 (×8): qty 1

## 2024-04-18 MED ORDER — LOVASTATIN 40 MG PO TABS
40.0000 mg | ORAL_TABLET | Freq: Every evening | ORAL | 3 refills | Status: DC
Start: 2024-04-18 — End: 2024-04-25

## 2024-04-18 MED ORDER — CLOPIDOGREL BISULFATE 75 MG PO TABS
75.0000 mg | ORAL_TABLET | Freq: Every day | ORAL | Status: DC
  Administered 2024-04-18 – 2024-04-21 (×4): 75 mg via ORAL
  Filled 2024-04-18 (×4): qty 1

## 2024-04-18 NOTE — Progress Notes (Signed)
 Transition of Care Department [facility]) has reviewed patient and no other TOC needs have been identified at this time. We will continue to monitor patient advancement through interdisciplinary progression rounds. If new patient transition needs arise, please place a TOC consult.   04/18/24 0817  TOC Brief Assessment  Insurance and Status Reviewed  Patient has primary care physician Yes  Home environment has been reviewed Lives alone.  Prior level of function: Independent.  Prior/Current Home Services No current home services  Social Drivers of Health Review SDOH reviewed no interventions necessary  Readmission risk has been reviewed Yes  Transition of care needs no transition of care needs at this time

## 2024-04-18 NOTE — Consult Note (Signed)
 Cardiology Consultation   Patient ID: Emily [name] MRN: [id number]; DOB: [birth year]  Admit date: 04/17/2024 Date of Consult: 04/18/2024  PCP:  Jolinda Norene HERO, DO   [facility] Providers Cardiologist:  Lynwood Schilling, MD  Electrophysiologist:  Will Gladis Norton, MD       Patient Profile: Emily [name] is a 73 y.o. female with a hx of PAF, aortic stenosis who is being seen 04/18/2024 for the evaluation of afib with RVR at the request of Dr . Manfred  History of Present Illness: Emily [name] 73 yo female history of PAF not on anticoag due to recurrent GI bleeds now with watchman device, mod aortic stenosis, carotid stenosis with prior stents, presents with leg swelling. Progressing leg swelling bilateral L>R, did not resolve with her usual lasix  regimen at home. Chronic SOB/DOE she reports fairly stable. Has noted high heart rates by home monitoring but no specific palpitaitons.    K 4.1 Cr 0.78 BUN 33 WBC 7.4 Hgb 10.7 Plt 251  EKG afib 110 CXR trace pleural effusions US  LLE venous: no DVT  11/2023 echo: LVE 60-65%, no WMAs, severe asymmetrical LVH, normal RV, severe LAE  AVA VTI 0.74 mean grad 36 DI 0.23 SVI 42. Moderate to severe AS with majority of criteria favoring severe.   03/2024 cMRI: asymmetrical septal LVH 1.6 cm, LVE 62%.    Past Medical History:  Diagnosis Date   Anemia    Arthritis    Atrial fibrillation, rapid (HCC) 10/27/2013   Bilateral carotid artery disease    Complication of anesthesia    difficult intubation in past   Diabetes mellitus without complication (HCC)    type 2   Difficult intubation    states 'lady that did the sleep study told me I have the smallest airway she has ever seen in an adult   Dyslipidemia 06/29/2017   Heart murmur    History of blood transfusion 09/2019   GI bleed - in [place]   Hypertension    Hypothyroid    Obesity    Obstructive sleep apnea    occasional uses CPAP   Peripheral vascular disease     Persistent atrial fibrillation (HCC) 01/16/2020   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 - xrays were negative   Upper GI bleed 09/25/2019    Past Surgical History:  Procedure Laterality Date   CATARACT EXTRACTION Left    CATARACT EXTRACTION W/PHACO Right 12/09/2015   Procedure: CATARACT EXTRACTION PHACO AND INTRAOCULAR LENS PLACEMENT (IOC);  Surgeon: Cherene Mania, MD;  Location: [facility];  Service: Ophthalmology;  Laterality: Right;  CDE:10.48   COLONOSCOPY W/ POLYPECTOMY     cyst on back of neck removed     DILATION AND CURETTAGE OF UTERUS     x 5   ENDARTERECTOMY Left 08/30/2015   Procedure: LEFT CAROYID ENDARTERECTOMY WITH XENOSURE BOVINE PERICARDIUM PATCH ANGIOPLASTY;  Surgeon: Gaile LELON New, MD;  Location: [facility] OR;  Service: Vascular;  Laterality: Left;   TONSILLECTOMY     TRANSCAROTID ARTERY REVASCULARIZATION  Right 03/19/2023   Procedure: Right Transcarotid Artery Revascularization;  Surgeon: New Gaile LELON, MD;  Location: [facility] OR;  Service: Vascular;  Laterality: Right;   ULTRASOUND GUIDANCE FOR VASCULAR ACCESS Left 03/19/2023   Procedure: ULTRASOUND GUIDANCE FOR VASCULAR ACCESS;  Surgeon: New Gaile LELON, MD;  Location: [facility] OR;  Service: Vascular;  Laterality: Left;   UPPER GI ENDOSCOPY     x several       Scheduled Meds:  Chlorhexidine   Gluconate Cloth  6 each Topical Daily   clopidogrel   75 mg Oral Daily   ezetimibe   10 mg Oral Daily   furosemide   40 mg Intravenous Q12H   Influenza vac split trivalent PF  0.5 mL Intramuscular Tomorrow-1000   levothyroxine   112 mcg Oral Q48H   [START ON 04/19/2024] levothyroxine   168 mcg Oral Q48H   pravastatin   40 mg Oral q1800   rOPINIRole   0.5 mg Oral QHS   Continuous Infusions:  diltiazem  (CARDIZEM ) infusion 15 mg/hr (04/18/24 0700)   PRN Meds: acetaminophen  **OR** acetaminophen , mouth rinse, prochlorperazine  Allergies:    Allergies  Allergen Reactions   Jardiance  [Empagliflozin ] Other (See  Comments)    RECURRENT VAGINITIS   Quinapril Hcl Cough   Statins Other (See Comments)    Not all Statins but some cause cough and pain in legs.   Tape Other (See Comments)    Redness, please use paper tape    Social History:   Social History   Socioeconomic History   Marital status: Single    Spouse name: Not on file   Number of children: Not on file   Years of education: Not on file   Highest education level: Not on file  Occupational History   Not on file  Tobacco Use   Smoking status: Never    Passive exposure: Never   Smokeless tobacco: Never  Vaping Use   Vaping status: Never Used  Substance and Sexual Activity   Alcohol use: No    Alcohol/week: 0.0 standard drinks of alcohol   Drug use: Never   Sexual activity: Not Currently    Birth control/protection: Post-menopausal  Other Topics Concern   Not on file  Social History Narrative   Not on file   Social Drivers of Health   Financial Resource Strain: Low Risk  (04/21/2022)   Overall Financial Resource Strain (CARDIA)    Difficulty of Paying Living Expenses: Not hard at all  Food Insecurity: No Food Insecurity (04/17/2024)   Hunger Vital Sign    Worried About Running Out of Food in the Last Year: Never true    Ran Out of Food in the Last Year: Never true  Transportation Needs: No Transportation Needs (04/17/2024)   PRAPARE - Administrator, Civil Service (Medical): No    Lack of Transportation (Non-Medical): No  Physical Activity: Inactive (04/21/2022)   Exercise Vital Sign    Days of Exercise per Week: 0 days    Minutes of Exercise per Session: 0 min  Stress: No Stress Concern Present (04/21/2022)   [employer] of Occupational Health - Occupational Stress Questionnaire    Feeling of Stress : Not at all  Social Connections: Moderately Integrated (04/17/2024)   Social Connection and Isolation Panel    Frequency of Communication with Friends and Family: More than three times a week     Frequency of Social Gatherings with Friends and Family: Twice a week    Attends Religious Services: More than 4 times per year    Active Member of [employer] or Organizations: Yes    Attends Banker Meetings: More than 4 times per year    Marital Status: Never married  Intimate Partner Violence: Not At Risk (04/17/2024)   Humiliation, Afraid, Rape, and Kick questionnaire    Fear of Current or Ex-Partner: No    Emotionally Abused: No    Physically Abused: No    Sexually Abused: No    Family History:    Family  History  Problem Relation Age of Onset   COPD Father    Heart failure Father    Heart disease Father    Emphysema Father    Arrhythmia Sister    Arrhythmia Sister    Arrhythmia Sister        had PPM also   Cancer Sister      ROS:  Please see the history of present illness.   All other ROS reviewed and negative.     Physical Exam/Data: Vitals:   04/18/24 0535 04/18/24 0600 04/18/24 0700 04/18/24 0727  BP: 111/66 122/61 100/60   Pulse: (!) 125 73 (!) 117   Resp: (!) 27 (!) 21 17   Temp:    98 F (36.7 C)  TempSrc:    Oral  SpO2: 95% 95% 93%   Weight:      Height:        Intake/Output Summary (Last 24 hours) at 04/18/2024 0821 Last data filed at 04/18/2024 0700 Gross per 24 hour  Intake 164.84 ml  Output 700 ml  Net -535.16 ml      04/18/2024    5:00 AM 04/17/2024    9:10 PM 04/17/2024   11:52 AM  Last 3 Weights  Weight (lbs) 204 lb 4.8 oz 202 lb 2.6 oz 201 lb  Weight (kg) 92.67 kg 91.7 kg 91.173 kg     Body mass index is 37.37 kg/m.  General:  Well nourished, well developed, in no acute distress HEENT: normal Neck: +JVD Vascular: No carotid bruits; Distal pulses 2+ bilaterally Cardiac:  irregular, tachy, 3/6 systolic murmur rusb Lungs:  decreased breath sounds bilateral bases Abd: soft, nontender, no hepatomegaly  Ext: 1+ bilateral LE edema Musculoskeletal:  No deformities, BUE and BLE strength normal and equal Skin: warm and dry   Neuro:  CNs 2-12 intact, no focal abnormalities noted Psych:  Normal affect     Laboratory Data: High Sensitivity Troponin:   Recent Labs  Lab 04/03/24 2231 04/04/24 0105 04/04/24 0538  TROPONINIHS 13 23* 160*     Chemistry Recent Labs  Lab 04/17/24 1259 04/18/24 0526  NA 139 140  K 4.1 3.5  CL 102 102  CO2 28 26  GLUCOSE 152* 159*  BUN 33* 29*  CREATININE 0.78 0.81  CALCIUM 9.1 8.8*  MG  --  2.1  GFRNONAA >60 >60  ANIONGAP 10 11    Recent Labs  Lab 04/18/24 0526  PROT 6.3*  ALBUMIN 4.0  AST 15  ALT 14  ALKPHOS 96  BILITOT 0.8   Lipids No results for input(s): CHOL, TRIG, HDL, LABVLDL, LDLCALC, CHOLHDL in the last 168 hours.  Hematology Recent Labs  Lab 04/17/24 1259 04/18/24 0526  WBC 7.4 8.4  RBC 3.56* 3.49*  HGB 10.7* 10.6*  HCT 34.0* 32.9*  MCV 95.5 94.3  MCH 30.1 30.4  MCHC 31.5 32.2  RDW 16.1* 16.0*  PLT 251 234   Thyroid  No results for input(s): TSH, FREET4 in the last 168 hours.  BNPNo results for input(s): BNP, PROBNP in the last 168 hours.  DDimer No results for input(s): DDIMER in the last 168 hours.  Radiology/Studies:  US  Venous Img Lower Bilateral Result Date: 04/17/2024 EXAM: ULTRASOUND DUPLEX OF THE BILATERAL LOWER EXTREMITY VEINS TECHNIQUE: Duplex ultrasound using B-mode/gray scaled imaging and Doppler spectral analysis and color flow was obtained of the deep venous structures of the bilateral lower extremity. COMPARISON: 02/23/2023 and previous. CLINICAL HISTORY: swelling FINDINGS: LEFT: The common femoral vein, femoral vein, popliteal vein, and posterior  tibial vein of the left lower extremity demonstrate normal compressibility with normal color flow and spectral analysis. RIGHT: The common femoral vein, femoral vein, popliteal vein, and posterior tibial vein of the right lower extremity demonstrate normal compressibility with normal color flow and spectral analysis. IMPRESSION: 1. No evidence of deep venous  thrombosis (DVT). Electronically signed by: Katheleen Faes MD 04/17/2024 05:06 PM EST RP Workstation: [id number]   DG Chest 2 View Result Date: 04/17/2024 CLINICAL DATA:  Congestive heart failure EXAM: CHEST - 2 VIEW COMPARISON:  Chest radiograph dated 04/06/2024 FINDINGS: Surgical clips project over the right apex. Normal lung volumes. No focal consolidations. Trace blunting of bilateral costophrenic angles. No pneumothorax. Enlarged cardiomediastinal silhouette is likely projectional. Septal occlusion device in-situ. No acute osseous abnormality. IMPRESSION: Trace blunting of bilateral costophrenic angles, which may represent trace pleural effusions. Electronically Signed   By: Limin  Xu M.D.   On: 04/17/2024 13:32   LONG TERM MONITOR (3-14 DAYS) Result Date: 04/14/2024 Predominant rhythm was sinus rhythm with sinus bradycardia First degree AV block was noted Paroxysmal atrial fib noted 12% burden, with the longest run being 27 hours. Post termination pause noted lasting 3.8 seconds.     Assessment and Plan: 1.PAF, afib with RVR this admission - has watchman device, off anticoag due to recurrent GI bleeds - failed flecanide and also significant LVH on echo, was discontinued - admit 11/4 with afib with RVR, self converted. After self conversion started on oral amiodarone 200mg  daily, had not received any prior load.  -at [facility] hospital f/u plans were for outpatient EP evaluation to consider ablation.  - from notes sinus rates can be low in the 50s, careful use of av nodal agents. Had been on dilt 240mg  at home and toprol  50mg   - started on IV dilt by admitting team running at 15mg /hr, remains in afib rates low 100s - would d/c IV diltiazem . Start IV amiodarone.  QTc around 480 with manual measure. In general higher accepted QTc accepted for amio as opposed to other class III antiarrhythmics.  REpeat EKG tomorrow AM  2.Aortic stenosis 11/2023 echo: LVE 60-65%, no WMAs, severe  asymmetrical LVH, normal RV, severe LAE  AVA VTI 0.74 mean grad 36 DI 0.23 SVI 42. Moderate to severe AS with majority of criteria favoring severe.  - was due to outpatient TAVR evaluation today, will need to reschedule at discharge - with evidence of volume overload will update echo.    3. Acute on chronic HFpEF - bilateral leg edema though L>R - LLE venous US  was negative - CXR trace bilateral effusions, bnp pending - JVD elevated, decreased breath sounds bileral bases, 1+ bilateral LE edema on exam.  - received IV lasix  40mg  x 1 yesterday, due for bid dosing today. Negative thus far, stable renal function - check reds vest - with borderline severe AS in 11/2023 presenting with new signs of fluid overload we will repeat echo           For questions or updates, please contact  [facility] Please consult [email or web address] for contact info under      Signed, Alvan Carrier, MD  04/18/2024 8:21 AM

## 2024-04-18 NOTE — Plan of Care (Signed)
  Problem: Education: Goal: Knowledge of General Education information will improve Description: Including pain rating scale, medication(s)/side effects and non-pharmacologic comfort measures Outcome: Progressing   Problem: Health Behavior/Discharge Planning: Goal: Ability to manage health-related needs will improve Outcome: Progressing   Problem: Clinical Measurements: Goal: Ability to maintain clinical measurements within normal limits will improve Outcome: Progressing Goal: Will remain free from infection Outcome: Progressing Goal: Diagnostic test results will improve Outcome: Progressing Goal: Respiratory complications will improve Outcome: Progressing Goal: Cardiovascular complication will be avoided Outcome: Progressing   Problem: Activity: Goal: Risk for activity intolerance will decrease Outcome: Progressing   Problem: Nutrition: Goal: Adequate nutrition will be maintained Outcome: Progressing   Problem: Coping: Goal: Level of anxiety will decrease Outcome: Progressing   Problem: Elimination: Goal: Will not experience complications related to bowel motility Outcome: Progressing Goal: Will not experience complications related to urinary retention Outcome: Progressing   Problem: Pain Managment: Goal: General experience of comfort will improve and/or be controlled Outcome: Progressing   Problem: Safety: Goal: Ability to remain free from injury will improve Outcome: Progressing   Problem: Skin Integrity: Goal: Risk for impaired skin integrity will decrease Outcome: Progressing   Problem: Education: Goal: Knowledge of disease or condition will improve Outcome: Progressing Goal: Understanding of medication regimen will improve Outcome: Progressing Goal: Individualized Educational Video(s) Outcome: Progressing   Problem: Activity: Goal: Ability to tolerate increased activity will improve Outcome: Progressing   Problem: Cardiac: Goal: Ability to achieve  and maintain adequate cardiopulmonary perfusion will improve Outcome: Progressing   Problem: Health Behavior/Discharge Planning: Goal: Ability to safely manage health-related needs after discharge will improve Outcome: Progressing   Problem: Education: Goal: Ability to describe self-care measures that may prevent or decrease complications (Diabetes Survival Skills Education) will improve Outcome: Progressing Goal: Individualized Educational Video(s) Outcome: Progressing   Problem: Coping: Goal: Ability to adjust to condition or change in health will improve Outcome: Progressing   Problem: Fluid Volume: Goal: Ability to maintain a balanced intake and output will improve Outcome: Progressing   Problem: Health Behavior/Discharge Planning: Goal: Ability to identify and utilize available resources and services will improve Outcome: Progressing Goal: Ability to manage health-related needs will improve Outcome: Progressing   Problem: Metabolic: Goal: Ability to maintain appropriate glucose levels will improve Outcome: Progressing   Problem: Nutritional: Goal: Maintenance of adequate nutrition will improve Outcome: Progressing Goal: Progress toward achieving an optimal weight will improve Outcome: Progressing   Problem: Skin Integrity: Goal: Risk for impaired skin integrity will decrease Outcome: Progressing   Problem: Tissue Perfusion: Goal: Adequacy of tissue perfusion will improve Outcome: Progressing

## 2024-04-18 NOTE — Telephone Encounter (Signed)
 Assisted with refills for Ozempic  Novo nordisk patient assistance program.  Program will end in 05/2024.  Patient stable on current regimen.  Refills sent for ARB and statin.  Needs refill; has myopathy to statins. Updated problem list.   Mliss Tarry Griffin, PharmD, BCACP, CPP Clinical Pharmacist, [facility]

## 2024-04-18 NOTE — Hospital Course (Signed)
 73 y.o. female with medical history significant of hypertension, hyperlipidemia, T2DM, chronic atrial fibrillation with Watchman device in place, acquired hypothyroidism, severe aortic stenosis, type 2 diabetes mellitus with hyperglycemia who presents to the emergency department via EMS due to left lower extremity edema.    Assessment and Plan:   Atrial fibrillation/flutter with RVR - Currently on IV Cardizem  drip.  Appears to be in a flutter, heart rates now in the 90s-low 100s.  Cardiology on board and following closely.  Likely transition off IV drip today.   Acute exacerbation of chronic HFpEF - Noted bilateral edema and shortness of breath on presentation.  IV diuretic on board.  Continue to monitor I's and O's.   Severe aortic stenosis - Patient states she had evaluation for TAVR.  Repeat echo pending.   Diabetes mellitus - Insulin  sliding scale on board.   Obstructive sleep apnea - CPAP ordered.   Hypertension - Controlled on current medication regimen.

## 2024-04-18 NOTE — Progress Notes (Signed)
  Echocardiogram 2D Echocardiogram has been performed.  Tinnie FORBES Gosling RDCS 04/18/2024, 12:05 PM

## 2024-04-18 NOTE — TOC Initial Note (Signed)
 Transition of Care [facility]) - Initial/Assessment Note    Patient Details  Name: Emily [name] MRN: [id number] Date of Birth: 01/04/1951  Transition of Care [facility]) CM/SW Contact:    Mcarthur Saddie Kim, LCSW Phone Number: 04/18/2024, 10:41 AM  Clinical Narrative: Pt admitted for paroxysmal atrial fibrillation with RVR. Assessment completed due to high risk readmission score. Pt reports she lives alone and is independent with ADLs. 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 No TOC needs identified at this time. TOC will follow.                 Expected Discharge Plan: Home/Self Care Barriers to Discharge: Continued Medical Work up   Patient Goals and CMS Choice Patient states their goals for this hospitalization and ongoing recovery are:: return home   Choice offered to / list presented to : Patient [place] ownership interest in [facility].provided to::  (n/a)    Expected Discharge Plan and Services In-house Referral: Clinical Social Work     Living arrangements for the past 2 months: Single Family Home                                      Prior Living Arrangements/Services Living arrangements for the past 2 months: Single Family Home Lives with:: Self Patient language and need for interpreter reviewed:: Yes Do you feel safe going back to the place where you live?: Yes      Need for Family Participation in Patient Care: No (Comment)   Current home services: DME (cane) Criminal Activity/Legal Involvement Pertinent to Current Situation/Hospitalization: No - Comment as needed  Activities of Daily Living   ADL Screening (condition at time of admission) Independently performs ADLs?: Yes (appropriate for developmental age) Is the patient deaf or have difficulty hearing?: No Does the patient have difficulty seeing, even when wearing glasses/contacts?: No Does the patient have difficulty concentrating, remembering, or making decisions?: No  Permission  Sought/Granted                  Emotional Assessment     Affect (typically observed): Appropriate Orientation: : Oriented to Place, Oriented to  Time, Oriented to Situation, Oriented to Self Alcohol / Substance Use: Not Applicable Psych Involvement: No (comment)  Admission diagnosis:  Paroxysmal atrial fibrillation with RVR (HCC) [I48.0] Patient Active Problem List   Diagnosis Date Noted   Paroxysmal atrial fibrillation with RVR (HCC) 04/17/2024   Leg swelling 04/06/2024   PAF (paroxysmal atrial fibrillation) (HCC) 04/04/2024   Bradycardia 01/20/2024   Mild mitral stenosis 05/06/2023   History of GI bleed 05/06/2023   Aortic stenosis, moderate 05/06/2023   Long term current use of antiarrhythmic drug 05/06/2023   Paroxysmal atrial fibrillation (HCC) 05/06/2023   Atrial fibrillation with RVR (HCC) 05/06/2023   Carotid artery stenosis 03/19/2023   Nonrheumatic aortic valve stenosis 03/19/2020   OSA on CPAP 09/25/2019   History of adenomatous polyp of colon 08/30/2019   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 device 07/16/2018   Chronic anticoagulation 03/03/2018   Restless leg syndrome 02/16/2018   Chronic left-sided low back pain with left-sided sciatica 02/16/2018   Morbid obesity (HCC) 02/14/2018   Bruit 02/14/2018   DDD (degenerative disc disease), lumbar 12/07/2017   Peripheral arterial disease 07/06/2017   Type 2 diabetes mellitus with hyperlipidemia (HCC) 06/29/2017   Iron  deficiency anemia 05/27/2017   Vitamin B12 deficiency 05/27/2017  Pain in joint, ankle and foot 06/03/2016   Bilateral carotid artery disease 07/05/2015   Multiple gastric polyps 07/03/2014   Diabetes mellitus treated with injections of non-insulin  medication (HCC)    Hypertension associated with diabetes (HCC) 10/27/2013   Hypothyroidism due to acquired atrophy of thyroid  10/27/2013   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 [place] [address] Phone: [phone] Fax: [phone]     Social Drivers of Health (SDOH) Social History: SDOH Screenings   Food Insecurity: No Food Insecurity (04/17/2024)  Housing: Low Risk  (04/17/2024)  Transportation Needs: No Transportation Needs (04/17/2024)  Utilities: Not At Risk (04/17/2024)  Depression (PHQ2-9): Low Risk  (04/12/2024)  Financial Resource Strain: Low Risk  (04/21/2022)  Physical Activity: Inactive (04/21/2022)  Social Connections: Moderately Integrated (04/17/2024)  Stress: No Stress Concern Present (04/21/2022)  Tobacco Use: Low Risk  (04/17/2024)   SDOH Interventions:     Readmission Risk Interventions    04/18/2024   10:23 AM  Readmission Risk Prevention Plan  Transportation Screening Complete  HRI or Home Care Consult Complete  Social Work Consult for Recovery Care Planning/Counseling Complete  Palliative Care Screening Not Applicable  Medication Review Oceanographer) Complete

## 2024-04-18 NOTE — Progress Notes (Signed)
 Progress Note   Patient: Emily [name] FMW:[id number] DOB: 02-04-1951 DOA: 04/17/2024  DOS: the patient was seen and examined on 04/18/2024   Brief hospital course:  73 y.o. female with medical history significant of hypertension, hyperlipidemia, T2DM, chronic atrial fibrillation with Watchman device in place, acquired hypothyroidism, severe aortic stenosis, type 2 diabetes mellitus with hyperglycemia who presents to the emergency department via EMS due to left lower extremity edema.   Assessment and Plan:  Atrial fibrillation/flutter with RVR - Currently on IV Cardizem  drip.  Appears to be in a flutter, heart rates now in the 90s-low 100s.  Cardiology on board and following closely.  Likely transition off IV drip today.  Acute exacerbation of chronic HFpEF - Noted bilateral edema and shortness of breath on presentation.  IV diuretic on board.  Continue to monitor I's and O's.  Severe aortic stenosis - Patient states she had evaluation for TAVR.  Repeat echo pending.  Diabetes mellitus - Insulin  sliding scale on board.  Obstructive sleep apnea - CPAP ordered.  Hypertension - Controlled on current medication regimen.   Subjective: Patient resting comfortably this morning.  States she feels a bit better than when she came in.  Questioning whether she should cancel her cardiology appointment that is later today.  Denies any chest pain, worsening shortness of breath, fever, chills, nausea, vomiting, abdominal pain.  Physical Exam:  Vitals:   04/18/24 1030 04/18/24 1100 04/18/24 1130 04/18/24 1200  BP: 100/71 102/78 95/80 (!) 111/96  Pulse: (!) 102 (!) 105    Resp: 20 16 (!) 24 20  Temp:      TempSrc:      SpO2: 97% 95%  94%  Weight:      Height:        GENERAL:  Alert, pleasant, no acute distress  HEENT:  EOMI CARDIOVASCULAR: Irregularly irregular RESPIRATORY:  Clear to auscultation, no wheezing, rales, or rhonchi GASTROINTESTINAL:  Soft, nontender,  nondistended EXTREMITIES: Mild LE edema bilaterally NEURO:  No new focal deficits appreciated SKIN:  No rashes noted PSYCH:  Appropriate mood and affect     Data Reviewed:  Imaging Studies: ECHOCARDIOGRAM COMPLETE Result Date: 04/18/2024    ECHOCARDIOGRAM REPORT   Patient Name:   Emily [name] Date of Exam: 04/18/2024 Medical Rec #:  [id number]        Height:       62.0 in Accession #:    [id number]       Weight:       204.3 lb Date of Birth:  28-Feb-1951        BSA:          1.929 m Patient Age:    73 years         BP:           102/78 mmHg Patient Gender: F                HR:           106 bpm. Exam Location:  [facility] Procedure: 2D Echo, Cardiac Doppler, Color Doppler and Intracardiac            Opacification Agent (Both Spectral and Color Flow Doppler were            utilized during procedure). Indications:    Aortic stenosis I35.0  History:        Patient has prior history of Echocardiogram examinations, most                 recent  12/07/2023. Arrythmias:Atrial Fibrillation; Risk                 Factors:Hypertension.  Sonographer:    Tinnie Gosling RDCS Referring Phys: [id number] DORN FALCON BRANCH IMPRESSIONS  1. Left ventricular ejection fraction, by estimation, is 45 to 50%. The left ventricle has mildly decreased function. Left ventricular endocardial border not optimally defined to evaluate regional wall motion. There is severe left ventricular hypertrophy. Left ventricular diastolic parameters are indeterminate.  2. Right ventricular systolic function is normal. The right ventricular size is normal. Tricuspid regurgitation signal is inadequate for assessing PA pressure.  3. Left atrial size was mildly dilated.  4. The mitral valve is abnormal. Mild mitral valve regurgitation. Mild to moderate mitral stenosis. The mean mitral valve gradient is 5.0 mmHg. Moderate mitral annular calcification.  5. AVA VTI 0.61, mean gradient 28 mmHg, DI 0.23, SVI 23. Severe aortic stenosis with lower than expected  gradient due to decreased stroke volume index. . The aortic valve is tricuspid. There is moderate calcification of the aortic valve. There is moderate thickening of the aortic valve. Aortic valve regurgitation is not visualized. Severe aortic valve stenosis.  6. The inferior vena cava is dilated in size with >50% respiratory variability, suggesting right atrial pressure of 8 mmHg. FINDINGS  Left Ventricle: Left ventricular ejection fraction, by estimation, is 45 to 50%. The left ventricle has mildly decreased function. Left ventricular endocardial border not optimally defined to evaluate regional wall motion. The left ventricular internal cavity size was normal in size. There is severe left ventricular hypertrophy. Left ventricular diastolic parameters are indeterminate. Right Ventricle: The right ventricular size is normal. Right vetricular wall thickness was not well visualized. Right ventricular systolic function is normal. Tricuspid regurgitation signal is inadequate for assessing PA pressure. Left Atrium: Left atrial size was mildly dilated. Right Atrium: Right atrial size was normal in size. Pericardium: There is no evidence of pericardial effusion. Mitral Valve: The mitral valve is abnormal. There is moderate thickening of the mitral valve leaflet(s). There is moderate calcification of the mitral valve leaflet(s). Moderate mitral annular calcification. Mild mitral valve regurgitation. Mild to moderate mitral valve stenosis. The mean mitral valve gradient is 5.0 mmHg. Tricuspid Valve: The tricuspid valve is not well visualized. Tricuspid valve regurgitation is not demonstrated. No evidence of tricuspid stenosis. Aortic Valve: AVA VTI 0.61, mean gradient 28 mmHg, DI 0.23, SVI 23. Severe aortic stenosis with lower than expected gradient due to decreased stroke volume index. The aortic valve is tricuspid. There is moderate calcification of the aortic valve. There is moderate thickening of the aortic valve. There  is moderate aortic valve annular calcification. Aortic valve regurgitation is not visualized. Severe aortic stenosis is present. Aortic valve mean gradient measures 27.5 mmHg. Aortic valve peak gradient measures 42.6 mmHg. Aortic valve area, by VTI measures 0.61 cm. Pulmonic Valve: The pulmonic valve was not well visualized. Pulmonic valve regurgitation is mild. No evidence of pulmonic stenosis. Aorta: The aortic root and ascending aorta are structurally normal, with no evidence of dilitation. Venous: The inferior vena cava is dilated in size with greater than 50% respiratory variability, suggesting right atrial pressure of 8 mmHg. IAS/Shunts: No atrial level shunt detected by color flow Doppler.  LEFT VENTRICLE PLAX 2D LVIDd:         4.40 cm   Diastology LVIDs:         3.50 cm   LV e' lateral:   3.70 cm/s LV PW:  1.50 cm   LV E/e' lateral: 45.4 LV IVS:        1.60 cm LVOT diam:     1.80 cm LV SV:         45 LV SV Index:   23 LVOT Area:     2.54 cm  RIGHT VENTRICLE            IVC RV S prime:     9.46 cm/s  IVC diam: 2.00 cm TAPSE (M-mode): 0.7 cm LEFT ATRIUM           Index        RIGHT ATRIUM           Index LA diam:      4.40 cm 2.28 cm/m   RA Area:     18.40 cm LA Vol (A4C): 66.2 ml 34.32 ml/m  RA Volume:   54.80 ml  28.41 ml/m  AORTIC VALVE AV Area (VTI):     0.61 cm AV Vmax:           326.49 cm/s AV Vmean:          249.200 cm/s AV VTI:            0.735 m AV Peak Grad:      42.6 mmHg AV Mean Grad:      27.5 mmHg LVOT VTI:          0.175 m LVOT/AV VTI ratio: 0.24  AORTA Ao Root diam: 2.60 cm Ao Asc diam:  3.10 cm MITRAL VALVE MV Area (PHT): 2.51 cm     SHUNTS MV Mean grad:  5.0 mmHg     Systemic VTI:  0.18 m MV Decel Time: 302 msec     Systemic Diam: 1.80 cm MV E velocity: 168.00 cm/s Dorn Ross MD Electronically signed by Dorn Ross MD Signature Date/Time: 04/18/2024/12:30:37 PM    Final    US  Venous Img Lower Bilateral Result Date: 04/17/2024 EXAM: ULTRASOUND DUPLEX OF THE BILATERAL  LOWER EXTREMITY VEINS TECHNIQUE: Duplex ultrasound using B-mode/gray scaled imaging and Doppler spectral analysis and color flow was obtained of the deep venous structures of the bilateral lower extremity. COMPARISON: 02/23/2023 and previous. CLINICAL HISTORY: swelling FINDINGS: LEFT: The common femoral vein, femoral vein, popliteal vein, and posterior tibial vein of the left lower extremity demonstrate normal compressibility with normal color flow and spectral analysis. RIGHT: The common femoral vein, femoral vein, popliteal vein, and posterior tibial vein of the right lower extremity demonstrate normal compressibility with normal color flow and spectral analysis. IMPRESSION: 1. No evidence of deep venous thrombosis (DVT). Electronically signed by: Katheleen Faes MD 04/17/2024 05:06 PM EST RP Workstation: [id number]   DG Chest 2 View Result Date: 04/17/2024 CLINICAL DATA:  Congestive heart failure EXAM: CHEST - 2 VIEW COMPARISON:  Chest radiograph dated 04/06/2024 FINDINGS: Surgical clips project over the right apex. Normal lung volumes. No focal consolidations. Trace blunting of bilateral costophrenic angles. No pneumothorax. Enlarged cardiomediastinal silhouette is likely projectional. Septal occlusion device in-situ. No acute osseous abnormality. IMPRESSION: Trace blunting of bilateral costophrenic angles, which may represent trace pleural effusions. Electronically Signed   By: Limin  Xu M.D.   On: 04/17/2024 13:32   LONG TERM MONITOR (3-14 DAYS) Result Date: 04/14/2024 Predominant rhythm was sinus rhythm with sinus bradycardia First degree AV block was noted Paroxysmal atrial fib noted 12% burden, with the longest run being 27 hours. Post termination pause noted lasting 3.8 seconds.   DG Chest 2 View Result Date: 04/06/2024 EXAM: 2 VIEW(S)  XRAY OF THE CHEST 04/06/2024 12:35:00 AM COMPARISON: CXR 04/25/2022 CLINICAL HISTORY: shortness of breath FINDINGS: LUNGS AND PLEURA: No focal pulmonary opacity. No  pulmonary edema. No pleural effusion. No pneumothorax. HEART AND MEDIASTINUM: No acute abnormality of the cardiac and mediastinal silhouettes. BONES AND SOFT TISSUES: No acute osseous abnormality. IMPRESSION: 1. No acute process. Electronically signed by: Greig Pique MD 04/06/2024 12:43 AM EST RP Workstation: [id number]   DG Chest 2 View Result Date: 04/03/2024 EXAM: 2 VIEW(S) XRAY OF THE CHEST 04/03/2024 11:10:53 PM COMPARISON: 05/06/2023 CLINICAL HISTORY: palpitations palpitations FINDINGS: LUNGS AND PLEURA: No focal pulmonary opacity. No pulmonary edema. No pleural effusion. No pneumothorax. HEART AND MEDIASTINUM: Aortic atherosclerosis. Borderline heart size. BONES AND SOFT TISSUES: No acute osseous abnormality. IMPRESSION: 1. No acute cardiopulmonary process identified. 2. Borderline cardiomegaly. Electronically signed by: Franky Crease MD 04/03/2024 11:40 PM EST RP Workstation: [id number]    Results are pending, will review when available.  Previous records (including but not limited to H&P, progress notes, nursing notes, TOC management) were reviewed in assessment of this patient.  Labs: CBC: Recent Labs  Lab 04/17/24 1259 04/18/24 0526  WBC 7.4 8.4  HGB 10.7* 10.6*  HCT 34.0* 32.9*  MCV 95.5 94.3  PLT 251 234   Basic Metabolic Panel: Recent Labs  Lab 04/17/24 1259 04/18/24 0526  NA 139 140  K 4.1 3.5  CL 102 102  CO2 28 26  GLUCOSE 152* 159*  BUN 33* 29*  CREATININE 0.78 0.81  CALCIUM 9.1 8.8*  MG  --  2.1  PHOS  --  3.7   Liver Function Tests: Recent Labs  Lab 04/18/24 0526  AST 15  ALT 14  ALKPHOS 96  BILITOT 0.8  PROT 6.3*  ALBUMIN 4.0   CBG: Recent Labs  Lab 04/18/24 0850 04/18/24 1115  GLUCAP 261* 234*    Scheduled Meds:  Chlorhexidine  Gluconate Cloth  6 each Topical Daily   clopidogrel   75 mg Oral Daily   ezetimibe   10 mg Oral Daily   furosemide   40 mg Intravenous Q12H   insulin  aspart  0-15 Units Subcutaneous TID WC   insulin  aspart  0-5  Units Subcutaneous QHS   levothyroxine   112 mcg Oral Q48H   [START ON 04/19/2024] levothyroxine   168 mcg Oral Q48H   pravastatin   40 mg Oral q1800   rOPINIRole   0.5 mg Oral QHS   Continuous Infusions:  amiodarone 60 mg/hr (04/18/24 1330)   Followed by   amiodarone     PRN Meds:.acetaminophen  **OR** acetaminophen , mouth rinse, prochlorperazine  Family Communication: None at bedside  Disposition: Status is: Inpatient Remains inpatient appropriate because: A-fib RVR/HFpEF     Time spent: 45 minutes  Length of inpatient stay: 0 days  Author: Carliss LELON Canales, DO 04/18/2024 3:01 PM  For on call review [email or web address].

## 2024-04-19 ENCOUNTER — Other Ambulatory Visit: Payer: Self-pay

## 2024-04-19 DIAGNOSIS — I350 Nonrheumatic aortic (valve) stenosis: Secondary | ICD-10-CM | POA: Diagnosis not present

## 2024-04-19 DIAGNOSIS — I480 Paroxysmal atrial fibrillation: Secondary | ICD-10-CM | POA: Diagnosis not present

## 2024-04-19 DIAGNOSIS — I5033 Acute on chronic diastolic (congestive) heart failure: Secondary | ICD-10-CM | POA: Diagnosis not present

## 2024-04-19 LAB — PRO BRAIN NATRIURETIC PEPTIDE: Pro Brain Natriuretic Peptide: 6770 pg/mL — ABNORMAL HIGH (ref <300.0)

## 2024-04-19 LAB — BASIC METABOLIC PANEL WITH GFR
Anion gap: 16 — ABNORMAL HIGH (ref 5–15)
BUN: 31 mg/dL — ABNORMAL HIGH (ref 8–23)
CO2: 23 mmol/L (ref 22–32)
Calcium: 8.9 mg/dL (ref 8.9–10.3)
Chloride: 99 mmol/L (ref 98–111)
Creatinine, Ser: 0.99 mg/dL (ref 0.44–1.00)
GFR, Estimated: 60 mL/min — ABNORMAL LOW (ref >60)
Glucose, Bld: 286 mg/dL — ABNORMAL HIGH (ref 70–99)
Potassium: 3.8 mmol/L (ref 3.5–5.1)
Sodium: 138 mmol/L (ref 135–145)

## 2024-04-19 LAB — CBC
HCT: 38 % (ref 36.0–46.0)
Hemoglobin: 12.3 g/dL (ref 12.0–15.0)
MCH: 30.2 pg (ref 26.0–34.0)
MCHC: 32.4 g/dL (ref 30.0–36.0)
MCV: 93.4 fL (ref 80.0–100.0)
Platelets: 235 K/uL (ref 150–400)
RBC: 4.07 MIL/uL (ref 3.87–5.11)
RDW: 15.9 % — ABNORMAL HIGH (ref 11.5–15.5)
WBC: 8.9 K/uL (ref 4.0–10.5)
nRBC: 0 % (ref 0.0–0.2)

## 2024-04-19 LAB — GLUCOSE, CAPILLARY
Glucose-Capillary: 285 mg/dL — ABNORMAL HIGH (ref 70–99)
Glucose-Capillary: 267 mg/dL — ABNORMAL HIGH (ref 70–99)
Glucose-Capillary: 207 mg/dL — ABNORMAL HIGH (ref 70–99)
Glucose-Capillary: 186 mg/dL — ABNORMAL HIGH (ref 70–99)

## 2024-04-19 LAB — MAGNESIUM: Magnesium: 1.8 mg/dL (ref 1.7–2.4)

## 2024-04-19 MED ORDER — METOPROLOL TARTRATE 5 MG/5ML IV SOLN
5.0000 mg | Freq: Once | INTRAVENOUS | Status: AC
  Administered 2024-04-19: 5 mg via INTRAVENOUS
  Filled 2024-04-19: qty 5

## 2024-04-19 MED ORDER — METOPROLOL TARTRATE 5 MG/5ML IV SOLN
5.0000 mg | Freq: Four times a day (QID) | INTRAVENOUS | Status: DC
  Administered 2024-04-19: 5 mg via INTRAVENOUS
  Filled 2024-04-19: qty 5

## 2024-04-19 MED ORDER — FUROSEMIDE 10 MG/ML IJ SOLN
60.0000 mg | Freq: Two times a day (BID) | INTRAMUSCULAR | Status: DC
  Administered 2024-04-19 – 2024-04-22 (×7): 60 mg via INTRAVENOUS
  Filled 2024-04-19 (×7): qty 6

## 2024-04-19 MED ORDER — METOPROLOL TARTRATE 5 MG/5ML IV SOLN
5.0000 mg | Freq: Three times a day (TID) | INTRAVENOUS | Status: DC
  Administered 2024-04-19: 5 mg via INTRAVENOUS
  Filled 2024-04-19: qty 5

## 2024-04-19 MED ORDER — DILTIAZEM HCL-DEXTROSE 125-5 MG/125ML-% IV SOLN (PREMIX): 5.0000 mg/h | INTRAVENOUS | Status: DC

## 2024-04-19 MED ORDER — MAGNESIUM SULFATE IN D5W 1-5 GM/100ML-% IV SOLN
1.0000 g | Freq: Once | INTRAVENOUS | Status: AC
  Administered 2024-04-19: 1 g via INTRAVENOUS
  Filled 2024-04-19: qty 100

## 2024-04-19 MED ORDER — AMIODARONE IV BOLUS ONLY 150 MG/100ML
150.0000 mg | Freq: Once | INTRAVENOUS | Status: AC
  Administered 2024-04-19: 150 mg via INTRAVENOUS

## 2024-04-19 MED ORDER — POTASSIUM CHLORIDE CRYS ER 20 MEQ PO TBCR
30.0000 meq | EXTENDED_RELEASE_TABLET | Freq: Once | ORAL | Status: AC
  Administered 2024-04-19: 30 meq via ORAL
  Filled 2024-04-19: qty 1

## 2024-04-19 MED ORDER — FUROSEMIDE 10 MG/ML IJ SOLN
20.0000 mg | Freq: Once | INTRAMUSCULAR | Status: AC
  Administered 2024-04-19: 20 mg via INTRAVENOUS
  Filled 2024-04-19: qty 2

## 2024-04-19 MED ORDER — METOPROLOL TARTRATE 25 MG PO TABS
25.0000 mg | ORAL_TABLET | Freq: Three times a day (TID) | ORAL | Status: AC
  Administered 2024-04-19 – 2024-04-20 (×4): 25 mg via ORAL
  Filled 2024-04-19 (×5): qty 1

## 2024-04-19 NOTE — Inpatient Diabetes Management (Signed)
 Inpatient Diabetes Program Recommendations  AACE/ADA: New Consensus Statement on Inpatient Glycemic Control (2015)  Target Ranges:  Prepandial:   less than 140 mg/dL      Peak postprandial:   less than 180 mg/dL (1-2 hours)      Critically ill patients:  140 - 180 mg/dL   Lab Results  Component Value Date   GLUCAP 267 (H) 04/19/2024   HGBA1C 5.5 04/04/2024    Review of Glycemic Control  Latest Reference Range & Units 04/18/24 11:15 04/18/24 15:52 04/18/24 20:41 04/19/24 07:26 04/19/24 11:38  Glucose-Capillary 70 - 99 mg/dL 765 (H) 832 (H) 778 (H) 285 (H) 267 (H)   Diabetes history: DM Outpatient Diabetes medications:  Metformin  500 mg bid, Ozempic  0.5 mg weekly Current orders for Inpatient glycemic control:  Novolog  0-15 units tid with meals and HS  Inpatient Diabetes Program Recommendations:    Please consider adding Semglee  14 units daily while in the hospital.   Thanks,  Randall Bullocks, RN, BC-ADM Inpatient Diabetes Coordinator Pager [phone]  (8a-5p)

## 2024-04-19 NOTE — Progress Notes (Signed)
 Rounding Note   Patient Name: Emily [name] Date of Encounter: 04/19/2024  [facility] Cardiologist: Lynwood Schilling, MD   Subjective Increased SOB yesterday afternoon  Scheduled Meds:  Chlorhexidine  Gluconate Cloth  6 each Topical Daily   clopidogrel   75 mg Oral Daily   ezetimibe   10 mg Oral Daily   furosemide   40 mg Intravenous Q12H   insulin  aspart  0-15 Units Subcutaneous TID WC   insulin  aspart  0-5 Units Subcutaneous QHS   levothyroxine   112 mcg Oral Q48H   levothyroxine   168 mcg Oral Q48H   metoprolol  tartrate  5 mg Intravenous Q8H   pravastatin   40 mg Oral q1800   rOPINIRole   0.5 mg Oral QHS   Continuous Infusions:  amiodarone 30 mg/hr (04/19/24 0649)   magnesium  sulfate bolus IVPB 1 g (04/19/24 0800)   PRN Meds: acetaminophen  **OR** acetaminophen , ipratropium-albuterol , LORazepam, mouth rinse, prochlorperazine   Vital Signs  Vitals:   04/19/24 0500 04/19/24 0600 04/19/24 0700 04/19/24 0739  BP: (!) 141/103 118/72 128/80   Pulse: (!) 129 (!) 39 (!) 129   Resp: 15 (!) 26 (!) 23   Temp:    (!) 97.5 F (36.4 C)  TempSrc:    Oral  SpO2: (!) 89% 95% 96%   Weight:      Height:        Intake/Output Summary (Last 24 hours) at 04/19/2024 0810 Last data filed at 04/19/2024 0649 Gross per 24 hour  Intake 933.35 ml  Output 2900 ml  Net -1966.65 ml      04/18/2024    5:00 AM 04/17/2024    9:10 PM 04/17/2024   11:52 AM  Last 3 Weights  Weight (lbs) 204 lb 4.8 oz 202 lb 2.6 oz 201 lb  Weight (kg) 92.67 kg 91.7 kg 91.173 kg      Telemetry Afib with RVR - Personally Reviewed  ECG  N/a - Personally Reviewed  Physical Exam  GEN: No acute distress.   Neck: _JVD Cardiac: irreg, tachy, 3/6 systolic murmur rusb, no jvd Respiratory: decreased breath sounds bilateral bases GI: Soft, nontender, non-distended  MS: trace bilaeral edema Neuro:  Nonfocal  Psych: Normal affect   Labs High Sensitivity Troponin:   Recent Labs  Lab 04/03/24 2231  04/04/24 0105 04/04/24 0538  TROPONINIHS 13 23* 160*     Chemistry Recent Labs  Lab 04/17/24 1259 04/18/24 0526 04/19/24 0522  NA 139 140 138  K 4.1 3.5 3.8  CL 102 102 99  CO2 28 26 23   GLUCOSE 152* 159* 286*  BUN 33* 29* 31*  CREATININE 0.78 0.81 0.99  CALCIUM 9.1 8.8* 8.9  MG  --  2.1 1.8  PROT  --  6.3*  --   ALBUMIN  --  4.0  --   AST  --  15  --   ALT  --  14  --   ALKPHOS  --  96  --   BILITOT  --  0.8  --   GFRNONAA >60 >60 60*  ANIONGAP 10 11 16*    Lipids No results for input(s): CHOL, TRIG, HDL, LABVLDL, LDLCALC, CHOLHDL in the last 168 hours.  Hematology Recent Labs  Lab 04/17/24 1259 04/18/24 0526 04/19/24 0522  WBC 7.4 8.4 8.9  RBC 3.56* 3.49* 4.07  HGB 10.7* 10.6* 12.3  HCT 34.0* 32.9* 38.0  MCV 95.5 94.3 93.4  MCH 30.1 30.4 30.2  MCHC 31.5 32.2 32.4  RDW 16.1* 16.0* 15.9*  PLT 251 234 235  Thyroid  No results for input(s): TSH, FREET4 in the last 168 hours.  BNP Recent Labs  Lab 04/19/24 0522  PROBNP 6,770.0*    DDimer No results for input(s): DDIMER in the last 168 hours.   Radiology  DG CHEST PORT 1 VIEW Result Date: 04/18/2024 CLINICAL DATA:  Shortness of breath EXAM: PORTABLE CHEST 1 VIEW COMPARISON:  Chest x-ray 04/17/2024 FINDINGS: Heart is enlarged. There is central pulmonary vascular congestion and central peribronchial thickening. There is no pleural effusion or pneumothorax. Surgical clips overlie the lung apex. No acute fractures are seen. IMPRESSION: Cardiomegaly with central pulmonary vascular congestion. Central peribronchial wall thickening, likely infectious/inflammatory. Electronically Signed   By: Greig Pique M.D.   On: 04/18/2024 17:19   ECHOCARDIOGRAM COMPLETE Result Date: 04/18/2024    ECHOCARDIOGRAM REPORT   Patient Name:   Emily [name] Date of Exam: 04/18/2024 Medical Rec #:  [id number]        Height:       62.0 in Accession #:    [id number]       Weight:       204.3 lb Date of Birth:   1950/06/02        BSA:          1.929 m Patient Age:    73 years         BP:           102/78 mmHg Patient Gender: F                HR:           106 bpm. Exam Location:  [facility] Procedure: 2D Echo, Cardiac Doppler, Color Doppler and Intracardiac            Opacification Agent (Both Spectral and Color Flow Doppler were            utilized during procedure). Indications:    Aortic stenosis I35.0  History:        Patient has prior history of Echocardiogram examinations, most                 recent 12/07/2023. Arrythmias:Atrial Fibrillation; Risk                 Factors:Hypertension.  Sonographer:    Tinnie Gosling RDCS Referring Phys: [id number] DORN FALCON Kamuela Magos IMPRESSIONS  1. Left ventricular ejection fraction, by estimation, is 45 to 50%. The left ventricle has mildly decreased function. Left ventricular endocardial border not optimally defined to evaluate regional wall motion. There is severe left ventricular hypertrophy. Left ventricular diastolic parameters are indeterminate.  2. Right ventricular systolic function is normal. The right ventricular size is normal. Tricuspid regurgitation signal is inadequate for assessing PA pressure.  3. Left atrial size was mildly dilated.  4. The mitral valve is abnormal. Mild mitral valve regurgitation. Mild to moderate mitral stenosis. The mean mitral valve gradient is 5.0 mmHg. Moderate mitral annular calcification.  5. AVA VTI 0.61, mean gradient 28 mmHg, DI 0.23, SVI 23. Severe aortic stenosis with lower than expected gradient due to decreased stroke volume index. . The aortic valve is tricuspid. There is moderate calcification of the aortic valve. There is moderate thickening of the aortic valve. Aortic valve regurgitation is not visualized. Severe aortic valve stenosis.  6. The inferior vena cava is dilated in size with >50% respiratory variability, suggesting right atrial pressure of 8 mmHg. FINDINGS  Left Ventricle: Left ventricular ejection fraction, by estimation, is  45 to 50%. The left ventricle has  mildly decreased function. Left ventricular endocardial border not optimally defined to evaluate regional wall motion. The left ventricular internal cavity size was normal in size. There is severe left ventricular hypertrophy. Left ventricular diastolic parameters are indeterminate. Right Ventricle: The right ventricular size is normal. Right vetricular wall thickness was not well visualized. Right ventricular systolic function is normal. Tricuspid regurgitation signal is inadequate for assessing PA pressure. Left Atrium: Left atrial size was mildly dilated. Right Atrium: Right atrial size was normal in size. Pericardium: There is no evidence of pericardial effusion. Mitral Valve: The mitral valve is abnormal. There is moderate thickening of the mitral valve leaflet(s). There is moderate calcification of the mitral valve leaflet(s). Moderate mitral annular calcification. Mild mitral valve regurgitation. Mild to moderate mitral valve stenosis. The mean mitral valve gradient is 5.0 mmHg. Tricuspid Valve: The tricuspid valve is not well visualized. Tricuspid valve regurgitation is not demonstrated. No evidence of tricuspid stenosis. Aortic Valve: AVA VTI 0.61, mean gradient 28 mmHg, DI 0.23, SVI 23. Severe aortic stenosis with lower than expected gradient due to decreased stroke volume index. The aortic valve is tricuspid. There is moderate calcification of the aortic valve. There is moderate thickening of the aortic valve. There is moderate aortic valve annular calcification. Aortic valve regurgitation is not visualized. Severe aortic stenosis is present. Aortic valve mean gradient measures 27.5 mmHg. Aortic valve peak gradient measures 42.6 mmHg. Aortic valve area, by VTI measures 0.61 cm. Pulmonic Valve: The pulmonic valve was not well visualized. Pulmonic valve regurgitation is mild. No evidence of pulmonic stenosis. Aorta: The aortic root and ascending aorta are structurally  normal, with no evidence of dilitation. Venous: The inferior vena cava is dilated in size with greater than 50% respiratory variability, suggesting right atrial pressure of 8 mmHg. IAS/Shunts: No atrial level shunt detected by color flow Doppler.  LEFT VENTRICLE PLAX 2D LVIDd:         4.40 cm   Diastology LVIDs:         3.50 cm   LV e' lateral:   3.70 cm/s LV PW:         1.50 cm   LV E/e' lateral: 45.4 LV IVS:        1.60 cm LVOT diam:     1.80 cm LV SV:         45 LV SV Index:   23 LVOT Area:     2.54 cm  RIGHT VENTRICLE            IVC RV S prime:     9.46 cm/s  IVC diam: 2.00 cm TAPSE (M-mode): 0.7 cm LEFT ATRIUM           Index        RIGHT ATRIUM           Index LA diam:      4.40 cm 2.28 cm/m   RA Area:     18.40 cm LA Vol (A4C): 66.2 ml 34.32 ml/m  RA Volume:   54.80 ml  28.41 ml/m  AORTIC VALVE AV Area (VTI):     0.61 cm AV Vmax:           326.49 cm/s AV Vmean:          249.200 cm/s AV VTI:            0.735 m AV Peak Grad:      42.6 mmHg AV Mean Grad:      27.5 mmHg LVOT VTI:  0.175 m LVOT/AV VTI ratio: 0.24  AORTA Ao Root diam: 2.60 cm Ao Asc diam:  3.10 cm MITRAL VALVE MV Area (PHT): 2.51 cm     SHUNTS MV Mean grad:  5.0 mmHg     Systemic VTI:  0.18 m MV Decel Time: 302 msec     Systemic Diam: 1.80 cm MV E velocity: 168.00 cm/s Dorn Ross MD Electronically signed by Dorn Ross MD Signature Date/Time: 04/18/2024/12:30:37 PM    Final    US  Venous Img Lower Bilateral Result Date: 04/17/2024 EXAM: ULTRASOUND DUPLEX OF THE BILATERAL LOWER EXTREMITY VEINS TECHNIQUE: Duplex ultrasound using B-mode/gray scaled imaging and Doppler spectral analysis and color flow was obtained of the deep venous structures of the bilateral lower extremity. COMPARISON: 02/23/2023 and previous. CLINICAL HISTORY: swelling FINDINGS: LEFT: The common femoral vein, femoral vein, popliteal vein, and posterior tibial vein of the left lower extremity demonstrate normal compressibility with normal color flow and  spectral analysis. RIGHT: The common femoral vein, femoral vein, popliteal vein, and posterior tibial vein of the right lower extremity demonstrate normal compressibility with normal color flow and spectral analysis. IMPRESSION: 1. No evidence of deep venous thrombosis (DVT). Electronically signed by: Katheleen Faes MD 04/17/2024 05:06 PM EST RP Workstation: [id number]   DG Chest 2 View Result Date: 04/17/2024 CLINICAL DATA:  Congestive heart failure EXAM: CHEST - 2 VIEW COMPARISON:  Chest radiograph dated 04/06/2024 FINDINGS: Surgical clips project over the right apex. Normal lung volumes. No focal consolidations. Trace blunting of bilateral costophrenic angles. No pneumothorax. Enlarged cardiomediastinal silhouette is likely projectional. Septal occlusion device in-situ. No acute osseous abnormality. IMPRESSION: Trace blunting of bilateral costophrenic angles, which may represent trace pleural effusions. Electronically Signed   By: Limin  Xu M.D.   On: 04/17/2024 13:32      Patient Profile   Emily [name] is a 73 y.o. female with a hx of PAF, aortic stenosis who is being seen 04/18/2024 for the evaluation of afib with RVR at the request of Dr . Manfred    Assessment & Plan  1.PAF, afib with RVR this admission - has watchman device, off anticoag due to recurrent GI bleeds - failed flecanide and also significant LVH on echo, was discontinued - admit 11/4 with afib with RVR, self converted. After self conversion started on oral amiodarone 200mg  daily, had not received any prior load.  -at [facility] hospital f/u plans were for outpatient EP evaluation to consider ablation.  - from notes sinus rates can be low in the 50s, careful use of av nodal agents. Had been on dilt 240mg  at home and toprol  50mg    - started on IV dilt by admitting team running at 15mg /hr, remained in afib rates low 100s - stopped IV dilt, start  IV amiodarone.  QTc around 480 with manual measure. In general higher  accepted QTc accepted for amio as opposed to other class III antiarrhythmics. EKG pending this AM  - ongoing afib with RVR. Robolused amio 150mg  x 2 yeseterday, remains on drip. BP's ok, IV lopressor  5mg  q 6 hours with hold parameters.       2.Aortic stenosis 11/2023 echo: LVE 60-65%, no WMAs, severe asymmetrical LVH, normal RV, severe LAE  AVA VTI 0.74 mean grad 36 DI 0.23 SVI 42. Moderate to severe AS with majority of criteria favoring severe.  -04/2024 echo: LVE 45-50%, indet diastolic, normal RV function, mild to mod MS mean grad 5, severe AS mean grad 28 AVA VTI 0.61 DI 0.23 SVI 23. Lower than  expected gradient due to low SVI.  - was due to outpatient TAVR evaluation today, likely inpatient evaluation to expedite her process given her relative decline in LVEF and clinical presentation with HF once afib is controlled and euvolemic.       3. 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   - increased SOB yesterday. PCX with pulm congestio, central bronchial thickening likely infectious/inflammatory.  - she is on IV lasix  40mg  bid, negative 1.9 L yesterday and 2.4 L since admission. Slight uptrend in Cr but still WNL.  - ongoing fluid overload, increased O2 requirements yesterday. Increase IV lasix  to 60mg  bid.    4. Bronchitis - noted on CXR - per primary team   5. AMS - some agitation yesterday, received some ativan per primary team  For questions or updates, please contact [facility] Please consult [email or web address] for contact info under       Signed, Alvan Carrier, MD  04/19/2024, 8:10 AM

## 2024-04-19 NOTE — Progress Notes (Signed)
 Progress Note   Patient: Emily [name] FMW:[id number] DOB: Mar 10, 1951 DOA: 04/17/2024  DOS: the patient was seen and examined on 04/19/2024   Brief hospital course:  73 y.o. female with medical history significant of hypertension, hyperlipidemia, T2DM, chronic atrial fibrillation with Watchman device in place, acquired hypothyroidism, severe aortic stenosis, type 2 diabetes mellitus with hyperglycemia who presents to the emergency department via EMS due to left lower extremity edema.   Assessment and Plan:  Atrial fibrillation/flutter with RVR - Still experiencing A-fib with RVR - Amiodarone and Lopressor  IV has been initiated as per cardiology recommendation - Continue telemetry monitoring - Continue to follow electrolytes with a goal of magnesium  above 2 and potassium above 4. - Follow clinical response and further recommendation by cardiology service.  Acute exacerbation of chronic HFpEF - Still demonstrating signs of fluid overload - Continue IV diuresis - Wean off oxygen supplementation and follow daily weights/strict intake and output - Low-sodium diet discussed with patient.  Severe aortic stenosis - Patient states she had evaluation for TAVR.  -After discussing with cardiology service plan will be for patient to be transferred to [facility] once volume status and heart rate is stabilized.  Diabetes mellitus - Insulin  sliding scale on board. - Continue to follow CBG fluctuation.  Obstructive sleep apnea - Sinew CPAP nightly.  Hypertension - Stable and well-controlled - Continue current antihypertensive agents.   Subjective:  No fever, no chest pain, no palpitations, no nausea vomiting.  Still with signs of fluid overload.  Requiring oxygen supplementation and demonstrating ongoing A-fib with RVR.  Physical Exam:  Vitals:   04/19/24 1730 04/19/24 1800 04/19/24 1830 04/19/24 1900  BP: (!) 163/58  (!) 155/50 (!) 150/48  Pulse: 73 72 70 69  Resp: 20 (!)  21 20 20   Temp:      TempSrc:      SpO2:  98% 99% 96%  Weight:      Height:       General exam: Alert, awake, oriented x 3; following commands appropriately and reporting no chest pain no palpitations at the moment.  Still with uncontrolled arrhythmias and experiencing sundowning/delirium overnight. Respiratory system: 4-5 L through nasal cannula supplementation.  Positive scattered rhonchi and decreased breath sounds at the bases.  No wheezing Cardiovascular system: Irregularly irregular; no rubs, no gallops, no JVD on exam.  Positive murmur appreciated. Gastrointestinal system: Abdomen is obese: Nondistended, soft and nontender. No organomegaly or masses felt. Normal bowel sounds heard. Central nervous system: Moving 4 limbs spontaneously.  No focal neurological deficits. Extremities: No cyanosis or clubbing; 1+ edema appreciated bilaterally. Skin: No petechiae. Psychiatry: Judgement and insight appear normal. Mood & affect appropriate.   Data Reviewed:  Imaging Studies: DG CHEST PORT 1 VIEW Result Date: 04/18/2024 CLINICAL DATA:  Shortness of breath EXAM: PORTABLE CHEST 1 VIEW COMPARISON:  Chest x-ray 04/17/2024 FINDINGS: Heart is enlarged. There is central pulmonary vascular congestion and central peribronchial thickening. There is no pleural effusion or pneumothorax. Surgical clips overlie the lung apex. No acute fractures are seen. IMPRESSION: Cardiomegaly with central pulmonary vascular congestion. Central peribronchial wall thickening, likely infectious/inflammatory. Electronically Signed   By: Greig Pique M.D.   On: 04/18/2024 17:19   ECHOCARDIOGRAM COMPLETE Result Date: 04/18/2024    ECHOCARDIOGRAM REPORT   Patient Name:   Emily [name] Date of Exam: 04/18/2024 Medical Rec #:  [id number]        Height:       62.0 in Accession #:    [id number]  Weight:       204.3 lb Date of Birth:  1950/09/08        BSA:          1.929 m Patient Age:    73 years         BP:            102/78 mmHg Patient Gender: F                HR:           106 bpm. Exam Location:  [facility] Procedure: 2D Echo, Cardiac Doppler, Color Doppler and Intracardiac            Opacification Agent (Both Spectral and Color Flow Doppler were            utilized during procedure). Indications:    Aortic stenosis I35.0  History:        Patient has prior history of Echocardiogram examinations, most                 recent 12/07/2023. Arrythmias:Atrial Fibrillation; Risk                 Factors:Hypertension.  Sonographer:    Tinnie Gosling RDCS Referring Phys: [id number] DORN FALCON BRANCH IMPRESSIONS  1. Left ventricular ejection fraction, by estimation, is 45 to 50%. The left ventricle has mildly decreased function. Left ventricular endocardial border not optimally defined to evaluate regional wall motion. There is severe left ventricular hypertrophy. Left ventricular diastolic parameters are indeterminate.  2. Right ventricular systolic function is normal. The right ventricular size is normal. Tricuspid regurgitation signal is inadequate for assessing PA pressure.  3. Left atrial size was mildly dilated.  4. The mitral valve is abnormal. Mild mitral valve regurgitation. Mild to moderate mitral stenosis. The mean mitral valve gradient is 5.0 mmHg. Moderate mitral annular calcification.  5. AVA VTI 0.61, mean gradient 28 mmHg, DI 0.23, SVI 23. Severe aortic stenosis with lower than expected gradient due to decreased stroke volume index. . The aortic valve is tricuspid. There is moderate calcification of the aortic valve. There is moderate thickening of the aortic valve. Aortic valve regurgitation is not visualized. Severe aortic valve stenosis.  6. The inferior vena cava is dilated in size with >50% respiratory variability, suggesting right atrial pressure of 8 mmHg. FINDINGS  Left Ventricle: Left ventricular ejection fraction, by estimation, is 45 to 50%. The left ventricle has mildly decreased function. Left ventricular  endocardial border not optimally defined to evaluate regional wall motion. The left ventricular internal cavity size was normal in size. There is severe left ventricular hypertrophy. Left ventricular diastolic parameters are indeterminate. Right Ventricle: The right ventricular size is normal. Right vetricular wall thickness was not well visualized. Right ventricular systolic function is normal. Tricuspid regurgitation signal is inadequate for assessing PA pressure. Left Atrium: Left atrial size was mildly dilated. Right Atrium: Right atrial size was normal in size. Pericardium: There is no evidence of pericardial effusion. Mitral Valve: The mitral valve is abnormal. There is moderate thickening of the mitral valve leaflet(s). There is moderate calcification of the mitral valve leaflet(s). Moderate mitral annular calcification. Mild mitral valve regurgitation. Mild to moderate mitral valve stenosis. The mean mitral valve gradient is 5.0 mmHg. Tricuspid Valve: The tricuspid valve is not well visualized. Tricuspid valve regurgitation is not demonstrated. No evidence of tricuspid stenosis. Aortic Valve: AVA VTI 0.61, mean gradient 28 mmHg, DI 0.23, SVI 23. Severe aortic stenosis with lower than expected gradient  due to decreased stroke volume index. The aortic valve is tricuspid. There is moderate calcification of the aortic valve. There is moderate thickening of the aortic valve. There is moderate aortic valve annular calcification. Aortic valve regurgitation is not visualized. Severe aortic stenosis is present. Aortic valve mean gradient measures 27.5 mmHg. Aortic valve peak gradient measures 42.6 mmHg. Aortic valve area, by VTI measures 0.61 cm. Pulmonic Valve: The pulmonic valve was not well visualized. Pulmonic valve regurgitation is mild. No evidence of pulmonic stenosis. Aorta: The aortic root and ascending aorta are structurally normal, with no evidence of dilitation. Venous: The inferior vena cava is dilated  in size with greater than 50% respiratory variability, suggesting right atrial pressure of 8 mmHg. IAS/Shunts: No atrial level shunt detected by color flow Doppler.  LEFT VENTRICLE PLAX 2D LVIDd:         4.40 cm   Diastology LVIDs:         3.50 cm   LV e' lateral:   3.70 cm/s LV PW:         1.50 cm   LV E/e' lateral: 45.4 LV IVS:        1.60 cm LVOT diam:     1.80 cm LV SV:         45 LV SV Index:   23 LVOT Area:     2.54 cm  RIGHT VENTRICLE            IVC RV S prime:     9.46 cm/s  IVC diam: 2.00 cm TAPSE (M-mode): 0.7 cm LEFT ATRIUM           Index        RIGHT ATRIUM           Index LA diam:      4.40 cm 2.28 cm/m   RA Area:     18.40 cm LA Vol (A4C): 66.2 ml 34.32 ml/m  RA Volume:   54.80 ml  28.41 ml/m  AORTIC VALVE AV Area (VTI):     0.61 cm AV Vmax:           326.49 cm/s AV Vmean:          249.200 cm/s AV VTI:            0.735 m AV Peak Grad:      42.6 mmHg AV Mean Grad:      27.5 mmHg LVOT VTI:          0.175 m LVOT/AV VTI ratio: 0.24  AORTA Ao Root diam: 2.60 cm Ao Asc diam:  3.10 cm MITRAL VALVE MV Area (PHT): 2.51 cm     SHUNTS MV Mean grad:  5.0 mmHg     Systemic VTI:  0.18 m MV Decel Time: 302 msec     Systemic Diam: 1.80 cm MV E velocity: 168.00 cm/s Dorn Ross MD Electronically signed by Dorn Ross MD Signature Date/Time: 04/18/2024/12:30:37 PM    Final    US  Venous Img Lower Bilateral Result Date: 04/17/2024 EXAM: ULTRASOUND DUPLEX OF THE BILATERAL LOWER EXTREMITY VEINS TECHNIQUE: Duplex ultrasound using B-mode/gray scaled imaging and Doppler spectral analysis and color flow was obtained of the deep venous structures of the bilateral lower extremity. COMPARISON: 02/23/2023 and previous. CLINICAL HISTORY: swelling FINDINGS: LEFT: The common femoral vein, femoral vein, popliteal vein, and posterior tibial vein of the left lower extremity demonstrate normal compressibility with normal color flow and spectral analysis. RIGHT: The common femoral vein, femoral vein, popliteal vein, and  posterior tibial  vein of the right lower extremity demonstrate normal compressibility with normal color flow and spectral analysis. IMPRESSION: 1. No evidence of deep venous thrombosis (DVT). Electronically signed by: Katheleen Faes MD 04/17/2024 05:06 PM EST RP Workstation: [id number]   DG Chest 2 View Result Date: 04/17/2024 CLINICAL DATA:  Congestive heart failure EXAM: CHEST - 2 VIEW COMPARISON:  Chest radiograph dated 04/06/2024 FINDINGS: Surgical clips project over the right apex. Normal lung volumes. No focal consolidations. Trace blunting of bilateral costophrenic angles. No pneumothorax. Enlarged cardiomediastinal silhouette is likely projectional. Septal occlusion device in-situ. No acute osseous abnormality. IMPRESSION: Trace blunting of bilateral costophrenic angles, which may represent trace pleural effusions. Electronically Signed   By: Limin  Xu M.D.   On: 04/17/2024 13:32   LONG TERM MONITOR (3-14 DAYS) Result Date: 04/14/2024 Predominant rhythm was sinus rhythm with sinus bradycardia First degree AV block was noted Paroxysmal atrial fib noted 12% burden, with the longest run being 27 hours. Post termination pause noted lasting 3.8 seconds.   DG Chest 2 View Result Date: 04/06/2024 EXAM: 2 VIEW(S) XRAY OF THE CHEST 04/06/2024 12:35:00 AM COMPARISON: CXR 04/25/2022 CLINICAL HISTORY: shortness of breath FINDINGS: LUNGS AND PLEURA: No focal pulmonary opacity. No pulmonary edema. No pleural effusion. No pneumothorax. HEART AND MEDIASTINUM: No acute abnormality of the cardiac and mediastinal silhouettes. BONES AND SOFT TISSUES: No acute osseous abnormality. IMPRESSION: 1. No acute process. Electronically signed by: Greig Pique MD 04/06/2024 12:43 AM EST RP Workstation: [id number]   DG Chest 2 View Result Date: 04/03/2024 EXAM: 2 VIEW(S) XRAY OF THE CHEST 04/03/2024 11:10:53 PM COMPARISON: 05/06/2023 CLINICAL HISTORY: palpitations palpitations FINDINGS: LUNGS AND PLEURA: No focal pulmonary  opacity. No pulmonary edema. No pleural effusion. No pneumothorax. HEART AND MEDIASTINUM: Aortic atherosclerosis. Borderline heart size. BONES AND SOFT TISSUES: No acute osseous abnormality. IMPRESSION: 1. No acute cardiopulmonary process identified. 2. Borderline cardiomegaly. Electronically signed by: Franky Crease MD 04/03/2024 11:40 PM EST RP Workstation: [id number]    Results are pending, will review when available.  Previous records (including but not limited to H&P, progress notes, nursing notes, TOC management) were reviewed in assessment of this patient.  Labs: CBC: Recent Labs  Lab 04/17/24 1259 04/18/24 0526 04/19/24 0522  WBC 7.4 8.4 8.9  HGB 10.7* 10.6* 12.3  HCT 34.0* 32.9* 38.0  MCV 95.5 94.3 93.4  PLT 251 234 235   Basic Metabolic Panel: Recent Labs  Lab 04/17/24 1259 04/18/24 0526 04/19/24 0522  NA 139 140 138  K 4.1 3.5 3.8  CL 102 102 99  CO2 28 26 23   GLUCOSE 152* 159* 286*  BUN 33* 29* 31*  CREATININE 0.78 0.81 0.99  CALCIUM 9.1 8.8* 8.9  MG  --  2.1 1.8  PHOS  --  3.7  --    Liver Function Tests: Recent Labs  Lab 04/18/24 0526  AST 15  ALT 14  ALKPHOS 96  BILITOT 0.8  PROT 6.3*  ALBUMIN 4.0   CBG: Recent Labs  Lab 04/18/24 1552 04/18/24 2041 04/19/24 0726 04/19/24 1138 04/19/24 1619  GLUCAP 167* 221* 285* 267* 207*    Scheduled Meds:  Chlorhexidine  Gluconate Cloth  6 each Topical Daily   clopidogrel   75 mg Oral Daily   ezetimibe   10 mg Oral Daily   furosemide   60 mg Intravenous BID   insulin  aspart  0-15 Units Subcutaneous TID WC   insulin  aspart  0-5 Units Subcutaneous QHS   levothyroxine   112 mcg Oral Q48H   levothyroxine   168 mcg Oral Q48H  metoprolol  tartrate  25 mg Oral TID   pravastatin   40 mg Oral q1800   rOPINIRole   0.5 mg Oral QHS   Continuous Infusions:  amiodarone 30 mg/hr (04/19/24 1513)   PRN Meds:.acetaminophen  **OR** acetaminophen , ipratropium-albuterol , LORazepam, mouth rinse, prochlorperazine  Family  Communication: Patient's niece at bedside  Disposition: Status is: Inpatient Remains inpatient appropriate because: A-fib RVR/HFpEF  Time spent: 50 minutes  Length of inpatient stay: 1 days  Author: Eric Nunnery, MD 04/19/2024 7:17 PM  For on call review [email or web address].

## 2024-04-19 NOTE — Telephone Encounter (Signed)
 Faxed completed refill form to [facility].

## 2024-04-20 ENCOUNTER — Other Ambulatory Visit: Payer: Self-pay

## 2024-04-20 DIAGNOSIS — I480 Paroxysmal atrial fibrillation: Secondary | ICD-10-CM | POA: Diagnosis not present

## 2024-04-20 DIAGNOSIS — I350 Nonrheumatic aortic (valve) stenosis: Secondary | ICD-10-CM | POA: Diagnosis not present

## 2024-04-20 DIAGNOSIS — I5033 Acute on chronic diastolic (congestive) heart failure: Secondary | ICD-10-CM | POA: Diagnosis not present

## 2024-04-20 LAB — CBC
HCT: 31.8 % — ABNORMAL LOW (ref 36.0–46.0)
Hemoglobin: 10.1 g/dL — ABNORMAL LOW (ref 12.0–15.0)
MCH: 30.1 pg (ref 26.0–34.0)
MCHC: 31.8 g/dL (ref 30.0–36.0)
MCV: 94.6 fL (ref 80.0–100.0)
Platelets: 207 K/uL (ref 150–400)
RBC: 3.36 MIL/uL — ABNORMAL LOW (ref 3.87–5.11)
RDW: 15.9 % — ABNORMAL HIGH (ref 11.5–15.5)
WBC: 10.9 K/uL — ABNORMAL HIGH (ref 4.0–10.5)
nRBC: 0 % (ref 0.0–0.2)

## 2024-04-20 LAB — BASIC METABOLIC PANEL WITH GFR
Anion gap: 11 (ref 5–15)
BUN: 35 mg/dL — ABNORMAL HIGH (ref 8–23)
CO2: 29 mmol/L (ref 22–32)
Calcium: 8.5 mg/dL — ABNORMAL LOW (ref 8.9–10.3)
Chloride: 100 mmol/L (ref 98–111)
Creatinine, Ser: 0.88 mg/dL (ref 0.44–1.00)
GFR, Estimated: >60 mL/min (ref >60)
Glucose, Bld: 158 mg/dL — ABNORMAL HIGH (ref 70–99)
Potassium: 3.3 mmol/L — ABNORMAL LOW (ref 3.5–5.1)
Sodium: 140 mmol/L (ref 135–145)

## 2024-04-20 LAB — GLUCOSE, CAPILLARY
Glucose-Capillary: 163 mg/dL — ABNORMAL HIGH (ref 70–99)
Glucose-Capillary: 207 mg/dL — ABNORMAL HIGH (ref 70–99)
Glucose-Capillary: 207 mg/dL — ABNORMAL HIGH (ref 70–99)
Glucose-Capillary: 205 mg/dL — ABNORMAL HIGH (ref 70–99)

## 2024-04-20 LAB — MAGNESIUM: Magnesium: 2.2 mg/dL (ref 1.7–2.4)

## 2024-04-20 MED ORDER — POLYETHYLENE GLYCOL 3350 17 G PO PACK
17.0000 g | PACK | Freq: Every day | ORAL | Status: DC
  Administered 2024-04-20 – 2024-04-24 (×5): 17 g via ORAL
  Filled 2024-04-20 (×6): qty 1

## 2024-04-20 MED ORDER — ASPIRIN 81 MG PO CHEW
81.0000 mg | CHEWABLE_TABLET | ORAL | Status: AC
  Administered 2024-04-21: 81 mg via ORAL
  Filled 2024-04-20: qty 1

## 2024-04-20 MED ORDER — AMIODARONE HCL IN DEXTROSE 360-4.14 MG/200ML-% IV SOLN
30.0000 mg/h | INTRAVENOUS | Status: DC
  Administered 2024-04-20 – 2024-04-22 (×4): 30 mg/h via INTRAVENOUS
  Filled 2024-04-20 (×5): qty 200

## 2024-04-20 MED ORDER — AMIODARONE HCL IN DEXTROSE 360-4.14 MG/200ML-% IV SOLN
60.0000 mg/h | INTRAVENOUS | Status: AC
  Administered 2024-04-20 (×2): 60 mg/h via INTRAVENOUS

## 2024-04-20 MED ORDER — AMIODARONE LOAD VIA INFUSION
150.0000 mg | Freq: Once | INTRAVENOUS | Status: AC
  Administered 2024-04-20: 150 mg via INTRAVENOUS
  Filled 2024-04-20: qty 83.34

## 2024-04-20 MED ORDER — METOPROLOL TARTRATE 5 MG/5ML IV SOLN
5.0000 mg | Freq: Once | INTRAVENOUS | Status: AC
  Administered 2024-04-20: 5 mg via INTRAVENOUS
  Filled 2024-04-20: qty 5

## 2024-04-20 MED ORDER — AMIODARONE HCL 200 MG PO TABS
200.0000 mg | ORAL_TABLET | Freq: Two times a day (BID) | ORAL | Status: DC
  Administered 2024-04-20: 200 mg via ORAL
  Filled 2024-04-20: qty 1

## 2024-04-20 MED ORDER — METOPROLOL SUCCINATE ER 50 MG PO TB24
50.0000 mg | ORAL_TABLET | Freq: Every day | ORAL | Status: DC
  Administered 2024-04-21 – 2024-04-22 (×2): 50 mg via ORAL
  Filled 2024-04-20 (×2): qty 1

## 2024-04-20 MED ORDER — POTASSIUM CHLORIDE CRYS ER 20 MEQ PO TBCR
40.0000 meq | EXTENDED_RELEASE_TABLET | ORAL | Status: AC
  Administered 2024-04-20 (×2): 40 meq via ORAL
  Filled 2024-04-20 (×2): qty 2

## 2024-04-20 MED ORDER — FREE WATER: 500.0000 mL | Freq: Once | Status: DC

## 2024-04-20 MED ORDER — DOCUSATE SODIUM 100 MG PO CAPS
100.0000 mg | ORAL_CAPSULE | Freq: Two times a day (BID) | ORAL | Status: DC
  Administered 2024-04-20 – 2024-04-24 (×9): 100 mg via ORAL
  Filled 2024-04-20 (×11): qty 1

## 2024-04-20 NOTE — Plan of Care (Signed)
  Problem: Education: Goal: Knowledge of General Education information will improve Description: Including pain rating scale, medication(s)/side effects and non-pharmacologic comfort measures Outcome: Progressing   Problem: Health Behavior/Discharge Planning: Goal: Ability to manage health-related needs will improve Outcome: Progressing   Problem: Clinical Measurements: Goal: Ability to maintain clinical measurements within normal limits will improve Outcome: Progressing Goal: Will remain free from infection Outcome: Progressing Goal: Diagnostic test results will improve Outcome: Progressing Goal: Respiratory complications will improve Outcome: Progressing Goal: Cardiovascular complication will be avoided Outcome: Progressing   Problem: Activity: Goal: Risk for activity intolerance will decrease Outcome: Progressing   Problem: Nutrition: Goal: Adequate nutrition will be maintained Outcome: Progressing   Problem: Coping: Goal: Level of anxiety will decrease Outcome: Progressing   Problem: Elimination: Goal: Will not experience complications related to bowel motility Outcome: Progressing Goal: Will not experience complications related to urinary retention Outcome: Progressing   Problem: Pain Managment: Goal: General experience of comfort will improve and/or be controlled Outcome: Progressing   Problem: Safety: Goal: Ability to remain free from injury will improve Outcome: Progressing   Problem: Skin Integrity: Goal: Risk for impaired skin integrity will decrease Outcome: Progressing   Problem: Education: Goal: Knowledge of disease or condition will improve Outcome: Progressing Goal: Understanding of medication regimen will improve Outcome: Progressing Goal: Individualized Educational Video(s) Outcome: Progressing   Problem: Activity: Goal: Ability to tolerate increased activity will improve Outcome: Progressing   Problem: Cardiac: Goal: Ability to achieve  and maintain adequate cardiopulmonary perfusion will improve Outcome: Progressing   Problem: Health Behavior/Discharge Planning: Goal: Ability to safely manage health-related needs after discharge will improve Outcome: Progressing   Problem: Education: Goal: Ability to describe self-care measures that may prevent or decrease complications (Diabetes Survival Skills Education) will improve Outcome: Progressing Goal: Individualized Educational Video(s) Outcome: Progressing   Problem: Coping: Goal: Ability to adjust to condition or change in health will improve Outcome: Progressing   Problem: Fluid Volume: Goal: Ability to maintain a balanced intake and output will improve Outcome: Progressing   Problem: Health Behavior/Discharge Planning: Goal: Ability to identify and utilize available resources and services will improve Outcome: Progressing Goal: Ability to manage health-related needs will improve Outcome: Progressing   Problem: Metabolic: Goal: Ability to maintain appropriate glucose levels will improve Outcome: Progressing   Problem: Nutritional: Goal: Maintenance of adequate nutrition will improve Outcome: Progressing Goal: Progress toward achieving an optimal weight will improve Outcome: Progressing   Problem: Skin Integrity: Goal: Risk for impaired skin integrity will decrease Outcome: Progressing   Problem: Tissue Perfusion: Goal: Adequacy of tissue perfusion will improve Outcome: Progressing   Problem: Education: Goal: Understanding of CV disease, CV risk reduction, and recovery process will improve Outcome: Progressing Goal: Individualized Educational Video(s) Outcome: Progressing   Problem: Activity: Goal: Ability to return to baseline activity level will improve Outcome: Progressing   Problem: Cardiovascular: Goal: Ability to achieve and maintain adequate cardiovascular perfusion will improve Outcome: Progressing Goal: Vascular access site(s) Level  0-1 will be maintained Outcome: Progressing   Problem: Health Behavior/Discharge Planning: Goal: Ability to safely manage health-related needs after discharge will improve Outcome: Progressing

## 2024-04-20 NOTE — Progress Notes (Signed)
 Patient given 150 mg Amiodarone bolus and continuous drip currently running at 60 mg/h. [facility] at bedside to transport patient to [facility]. No complaints from the patient at this time.

## 2024-04-20 NOTE — Progress Notes (Signed)
 Recurrent afib this afternoon, we had stopped amio drip this AM as she was back in SR. Will reload IV amiodarone .  JINNY Ross MD

## 2024-04-20 NOTE — Progress Notes (Addendum)
 Rounding Note   Patient Name: Emily [name] Date of Encounter: 04/20/2024  [facility] Cardiologist: Lynwood Schilling, MD   Subjective SOB is improving.   Scheduled Meds:  Chlorhexidine  Gluconate Cloth  6 each Topical Daily   clopidogrel   75 mg Oral Daily   ezetimibe   10 mg Oral Daily   furosemide   60 mg Intravenous BID   insulin  aspart  0-15 Units Subcutaneous TID WC   insulin  aspart  0-5 Units Subcutaneous QHS   levothyroxine   112 mcg Oral Q48H   levothyroxine   168 mcg Oral Q48H   metoprolol  tartrate  25 mg Oral TID   pravastatin   40 mg Oral q1800   rOPINIRole   0.5 mg Oral QHS   Continuous Infusions:  amiodarone 30 mg/hr (04/20/24 0545)   PRN Meds: acetaminophen  **OR** acetaminophen , ipratropium-albuterol , LORazepam, mouth rinse, prochlorperazine   Vital Signs  Vitals:   04/20/24 0200 04/20/24 0400 04/20/24 0700 04/20/24 0729  BP: (!) 163/51 (!) 172/53 (!) 170/56 (!) 166/53  Pulse: 66 67 69 71  Resp: (!) 21 (!) 25 (!) 24 19  Temp:  97.9 F (36.6 C)  98.2 F (36.8 C)  TempSrc:  Oral  Oral  SpO2: 94% 93% 95% 93%  Weight:      Height:        Intake/Output Summary (Last 24 hours) at 04/20/2024 0802 Last data filed at 04/20/2024 0309 Gross per 24 hour  Intake 677.14 ml  Output 1800 ml  Net -1122.86 ml      04/19/2024    8:00 AM 04/18/2024    5:00 AM 04/17/2024    9:10 PM  Last 3 Weights  Weight (lbs) 202 lb 13.2 oz 204 lb 4.8 oz 202 lb 2.6 oz  Weight (kg) 92 kg 92.67 kg 91.7 kg      Telemetry SR - Personally Reviewed  ECG  N/a - Personally Reviewed  Physical Exam  GEN: No acute distress.   Neck: + JVD Cardiac: RRR, 3/6 systolic murmur rusb Respiratory: crackles bilaterally GI: Soft, nontender, non-distended  MS: No edema; No deformity. Neuro:  Nonfocal  Psych: Normal affect   Labs High Sensitivity Troponin:   Recent Labs  Lab 04/03/24 2231 04/04/24 0105 04/04/24 0538  TROPONINIHS 13 23* 160*     Chemistry Recent Labs   Lab 04/18/24 0526 04/19/24 0522 04/20/24 0510  NA 140 138 140  K 3.5 3.8 3.3*  CL 102 99 100  CO2 26 23 29   GLUCOSE 159* 286* 158*  BUN 29* 31* 35*  CREATININE 0.81 0.99 0.88  CALCIUM 8.8* 8.9 8.5*  MG 2.1 1.8 2.2  PROT 6.3*  --   --   ALBUMIN 4.0  --   --   AST 15  --   --   ALT 14  --   --   ALKPHOS 96  --   --   BILITOT 0.8  --   --   GFRNONAA >60 60* >60  ANIONGAP 11 16* 11    Lipids No results for input(s): CHOL, TRIG, HDL, LABVLDL, LDLCALC, CHOLHDL in the last 168 hours.  Hematology Recent Labs  Lab 04/18/24 0526 04/19/24 0522 04/20/24 0510  WBC 8.4 8.9 10.9*  RBC 3.49* 4.07 3.36*  HGB 10.6* 12.3 10.1*  HCT 32.9* 38.0 31.8*  MCV 94.3 93.4 94.6  MCH 30.4 30.2 30.1  MCHC 32.2 32.4 31.8  RDW 16.0* 15.9* 15.9*  PLT 234 235 207   Thyroid  No results for input(s): TSH, FREET4 in the last 168 hours.  BNP Recent Labs  Lab 04/19/24 0522  PROBNP 6,770.0*    DDimer No results for input(s): DDIMER in the last 168 hours.   Radiology  DG CHEST PORT 1 VIEW Result Date: 04/18/2024 CLINICAL DATA:  Shortness of breath EXAM: PORTABLE CHEST 1 VIEW COMPARISON:  Chest x-ray 04/17/2024 FINDINGS: Heart is enlarged. There is central pulmonary vascular congestion and central peribronchial thickening. There is no pleural effusion or pneumothorax. Surgical clips overlie the lung apex. No acute fractures are seen. IMPRESSION: Cardiomegaly with central pulmonary vascular congestion. Central peribronchial wall thickening, likely infectious/inflammatory. Electronically Signed   By: Greig Pique M.D.   On: 04/18/2024 17:19   ECHOCARDIOGRAM COMPLETE Result Date: 04/18/2024    ECHOCARDIOGRAM REPORT   Patient Name:   Emily [name] Date of Exam: 04/18/2024 Medical Rec #:  [id number]        Height:       62.0 in Accession #:    [id number]       Weight:       204.3 lb Date of Birth:  06-23-1950        BSA:          1.929 m Patient Age:    73 years         BP:            102/78 mmHg Patient Gender: F                HR:           106 bpm. Exam Location:  [facility] Procedure: 2D Echo, Cardiac Doppler, Color Doppler and Intracardiac            Opacification Agent (Both Spectral and Color Flow Doppler were            utilized during procedure). Indications:    Aortic stenosis I35.0  History:        Patient has prior history of Echocardiogram examinations, most                 recent 12/07/2023. Arrythmias:Atrial Fibrillation; Risk                 Factors:Hypertension.  Sonographer:    Tinnie Gosling RDCS Referring Phys: [id number] DORN FALCON Leandro Berkowitz IMPRESSIONS  1. Left ventricular ejection fraction, by estimation, is 45 to 50%. The left ventricle has mildly decreased function. Left ventricular endocardial border not optimally defined to evaluate regional wall motion. There is severe left ventricular hypertrophy. Left ventricular diastolic parameters are indeterminate.  2. Right ventricular systolic function is normal. The right ventricular size is normal. Tricuspid regurgitation signal is inadequate for assessing PA pressure.  3. Left atrial size was mildly dilated.  4. The mitral valve is abnormal. Mild mitral valve regurgitation. Mild to moderate mitral stenosis. The mean mitral valve gradient is 5.0 mmHg. Moderate mitral annular calcification.  5. AVA VTI 0.61, mean gradient 28 mmHg, DI 0.23, SVI 23. Severe aortic stenosis with lower than expected gradient due to decreased stroke volume index. . The aortic valve is tricuspid. There is moderate calcification of the aortic valve. There is moderate thickening of the aortic valve. Aortic valve regurgitation is not visualized. Severe aortic valve stenosis.  6. The inferior vena cava is dilated in size with >50% respiratory variability, suggesting right atrial pressure of 8 mmHg. FINDINGS  Left Ventricle: Left ventricular ejection fraction, by estimation, is 45 to 50%. The left ventricle has mildly decreased function. Left ventricular  endocardial border not optimally defined to evaluate regional  wall motion. The left ventricular internal cavity size was normal in size. There is severe left ventricular hypertrophy. Left ventricular diastolic parameters are indeterminate. Right Ventricle: The right ventricular size is normal. Right vetricular wall thickness was not well visualized. Right ventricular systolic function is normal. Tricuspid regurgitation signal is inadequate for assessing PA pressure. Left Atrium: Left atrial size was mildly dilated. Right Atrium: Right atrial size was normal in size. Pericardium: There is no evidence of pericardial effusion. Mitral Valve: The mitral valve is abnormal. There is moderate thickening of the mitral valve leaflet(s). There is moderate calcification of the mitral valve leaflet(s). Moderate mitral annular calcification. Mild mitral valve regurgitation. Mild to moderate mitral valve stenosis. The mean mitral valve gradient is 5.0 mmHg. Tricuspid Valve: The tricuspid valve is not well visualized. Tricuspid valve regurgitation is not demonstrated. No evidence of tricuspid stenosis. Aortic Valve: AVA VTI 0.61, mean gradient 28 mmHg, DI 0.23, SVI 23. Severe aortic stenosis with lower than expected gradient due to decreased stroke volume index. The aortic valve is tricuspid. There is moderate calcification of the aortic valve. There is moderate thickening of the aortic valve. There is moderate aortic valve annular calcification. Aortic valve regurgitation is not visualized. Severe aortic stenosis is present. Aortic valve mean gradient measures 27.5 mmHg. Aortic valve peak gradient measures 42.6 mmHg. Aortic valve area, by VTI measures 0.61 cm. Pulmonic Valve: The pulmonic valve was not well visualized. Pulmonic valve regurgitation is mild. No evidence of pulmonic stenosis. Aorta: The aortic root and ascending aorta are structurally normal, with no evidence of dilitation. Venous: The inferior vena cava is dilated  in size with greater than 50% respiratory variability, suggesting right atrial pressure of 8 mmHg. IAS/Shunts: No atrial level shunt detected by color flow Doppler.  LEFT VENTRICLE PLAX 2D LVIDd:         4.40 cm   Diastology LVIDs:         3.50 cm   LV e' lateral:   3.70 cm/s LV PW:         1.50 cm   LV E/e' lateral: 45.4 LV IVS:        1.60 cm LVOT diam:     1.80 cm LV SV:         45 LV SV Index:   23 LVOT Area:     2.54 cm  RIGHT VENTRICLE            IVC RV S prime:     9.46 cm/s  IVC diam: 2.00 cm TAPSE (M-mode): 0.7 cm LEFT ATRIUM           Index        RIGHT ATRIUM           Index LA diam:      4.40 cm 2.28 cm/m   RA Area:     18.40 cm LA Vol (A4C): 66.2 ml 34.32 ml/m  RA Volume:   54.80 ml  28.41 ml/m  AORTIC VALVE AV Area (VTI):     0.61 cm AV Vmax:           326.49 cm/s AV Vmean:          249.200 cm/s AV VTI:            0.735 m AV Peak Grad:      42.6 mmHg AV Mean Grad:      27.5 mmHg LVOT VTI:          0.175 m LVOT/AV VTI ratio: 0.24  AORTA  Ao Root diam: 2.60 cm Ao Asc diam:  3.10 cm MITRAL VALVE MV Area (PHT): 2.51 cm     SHUNTS MV Mean grad:  5.0 mmHg     Systemic VTI:  0.18 m MV Decel Time: 302 msec     Systemic Diam: 1.80 cm MV E velocity: 168.00 cm/s Dorn Ross MD Electronically signed by Dorn Ross MD Signature Date/Time: 04/18/2024/12:30:37 PM    Final     Cardiac Studies  Patient Profile   Emily [name] is a 73 y.o. female with a hx of PAF, aortic stenosis who is being seen 04/18/2024 for the evaluation of afib with RVR at the request of Dr . Manfred   Assessment & Plan   1.PAF, afib with RVR this admission - has watchman device, off anticoag due to recurrent GI bleeds - failed flecanide and also significant LVH on echo, was discontinued - admit 11/4 with afib with RVR, self converted. After self conversion started on oral amiodarone  200mg  daily, had not received any prior load.  -at [facility] hospital f/u plans were for outpatient EP evaluation to consider  ablation.  - from notes sinus rates can be low in the 50s, careful use of av nodal agents. Had been on dilt 240mg  at home and toprol  50mg    - started on IV dilt by admitting team running at 15mg /hr, remained in afib rates low 100s - stopped IV dilt, start  IV amiodarone .  QTc around 480 with manual measure. In general higher accepted QTc accepted for amio as opposed to other class III antiarrhythmics.   - converted to SR yesterday on IV amio. - Transition to oral 200mg  bid x 3 weeks then 200mg  daily.  - transition from lopressor  to toprol  50mg  daily starting tomorrow - QTc manually measured and adjusted for RBBB is , ok on amio.          2.Aortic stenosis 11/2023 echo: LVE 60-65%, no WMAs, severe asymmetrical LVH, normal RV, severe LAE  AVA VTI 0.74 mean grad 36 DI 0.23 SVI 42. Moderate to severe AS with majority of criteria favoring severe.  -04/2024 echo: LVE 45-50%, indet diastolic, normal RV function, mild to mod MS mean grad 5, severe AS mean grad 28 AVA VTI 0.61 DI 0.23 SVI 23. Lower than expected gradient due to low SVI.  - was due to outpatient TAVR evaluation but missed due to admission, plan for inpatient evaluation to expedite her workup given her relative decline in LVEF and clinical presentation with HF - transfer to [facility], plan for RHC/LHC tomorrow and also evaluation by structural heart team.     Informed Consent   Shared Decision Making/Informed Consent The risks [stroke (1 in 1000), death (1 in 1000), kidney failure [usually temporary] (1 in 500), bleeding (1 in 200), allergic reaction [possibly serious] (1 in 200)], benefits (diagnostic support and management of coronary artery disease) and alternatives of a cardiac catheterization were discussed in detail with Ms. Huelsmann and she is willing to proceed.       3. 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    -she is on IV lasix  60mg  bid, I/Os incomplete. Roughly negative 1.1 L yesterday and 3.6 L since admission. Mild variation in Cr without clear trend. O2 requirement down  fro 5L to 2L today. Remains fluid overloaded, continue IV diuresis.      4. Bronchitis - noted on CXR - per primary team     5. AMS - some agitation yesterday, received some ativan per primary team   Plan for transfer to [facility] today, RHC/LHC is scheduled for tomorrow with Dr Verlin.   For questions or updates, please contact [facility] Please consult [email or web address] for contact info under       Signed, Alvan Carrier, MD  04/20/2024, 8:02 AM

## 2024-04-20 NOTE — Progress Notes (Signed)
 Progress Note   Patient: Emily [name] FMW:[id number] DOB: [birth year] DOA: 04/17/2024  DOS: the patient was seen and examined on 04/20/2024   Brief hospital course: 73 y.o. female with medical history significant of hypertension, hyperlipidemia, T2DM, chronic atrial fibrillation with Watchman device in place, acquired hypothyroidism, severe aortic stenosis, type 2 diabetes mellitus with hyperglycemia who presents to the emergency department via EMS due to left lower extremity edema.   Assessment and Plan:  Atrial fibrillation/flutter with RVR - Excellent response to the use of amiodarone and Lopressor  - Patient has converted to sinus rhythm and is demonstrating good rate control. - Following cardiology service recommendation adjusted dose of oral Lopressor  and oral amiodarone has been initiated - Patient hemodynamically stable for telemetry bed. - Continue to follow electrolytes with intention to maintain potassium above 4 and magnesium  above 2.   - No anticoagulation currently with anticipated heart cath on 04/21/2024. - Continue SCDs for DVT prophylaxis.  Acute exacerbation of chronic HFpEF - Low-sodium diet has been discussed with patient - Continue to follow daily weights and strict intake and output - Continue IV diuresis; dose adjusted by cardiology service. - Patient down to 2 L nasal cannula supplementation and reporting no significant orthopnea or shortness of breath currently.  Severe aortic stenosis - Patient will be transferred to [facility] for further evaluation for anticipated TAVR - Plan is for cardiac cath (right and left heart cath) on 04/21/2024.  Diabetes mellitus - Insulin  sliding scale on board. - Continue to follow CBG fluctuation.  Obstructive sleep apnea - Continue the use of CPAP nightly.  Hypertension - Stable and well-controlled - Continue current antihypertensive agents. - Follow vital signs.  Class II obesity -Body mass index is  37.1 kg/m.  -Low-calorie diet and portion control discussed with patient.   Subjective:  No fever, no chest pain, no nausea, no vomiting.  Patient denies palpitations.  Telemetry evaluation demonstrating sinus rhythm and currently rate controlled.  2 L nasal cannula supplementation in place and feeling significantly better.  Physical Exam:  Vitals:   04/20/24 1154 04/20/24 1200 04/20/24 1300 04/20/24 1400  BP: (!) 123/40 (!) 114/50 (!) 124/50 (!) 133/52  Pulse: 64 69 69   Resp: 19 19 (!) 23 20  Temp:      TempSrc:      SpO2: 97% 100% 100% 97%  Weight:      Height:       General exam: Alert, awake, oriented x 3; slightly better and breathing easier.  Denies orthopnea.  2 L nasal cannula in place.  No chest pain or palpitation. Respiratory system: No frank crackles on exam; no wheezing.  2 L nasal cannula in place.  No using accessory muscle. Cardiovascular system:RRR.  No rubs, no gallops, no JVD on exam.  Positive murmur. Gastrointestinal system: Abdomen is obese, nondistended, soft and nontender. No organomegaly or masses felt. Normal bowel sounds heard. Central nervous system: Moving 4 limbs spontaneously.  No focal neurological deficits. Extremities: No cyanosis or clubbing.  Trace edema appreciated bilaterally. Skin: No petechiae. Psychiatry: Judgement and insight appear normal. Mood & affect appropriate.   Data Reviewed: Basic metabolic panel: Sodium 140, potassium 3.3, chloride 100, bicarb 29, BUN 35, creatinine 0.88 and GFR >60 CBC: WBCs 10.9, hemoglobin 10.1 and platelet count 207K Magnesium : 2.2  Imaging Studies: DG CHEST PORT 1 VIEW Result Date: 04/18/2024 CLINICAL DATA:  Shortness of breath EXAM: PORTABLE CHEST 1 VIEW COMPARISON:  Chest x-ray 04/17/2024 FINDINGS: Heart is enlarged. There is central pulmonary  vascular congestion and central peribronchial thickening. There is no pleural effusion or pneumothorax. Surgical clips overlie the lung apex. No acute fractures  are seen. IMPRESSION: Cardiomegaly with central pulmonary vascular congestion. Central peribronchial wall thickening, likely infectious/inflammatory. Electronically Signed   By: Greig Pique M.D.   On: 04/18/2024 17:19   ECHOCARDIOGRAM COMPLETE Result Date: 04/18/2024    ECHOCARDIOGRAM REPORT   Patient Name:   Emily [name] Date of Exam: 04/18/2024 Medical Rec #:  [id number]        Height:       62.0 in Accession #:    [id number]       Weight:       204.3 lb Date of Birth:  1950/11/06        BSA:          1.929 m Patient Age:    73 years         BP:           102/78 mmHg Patient Gender: F                HR:           106 bpm. Exam Location:  [facility] Procedure: 2D Echo, Cardiac Doppler, Color Doppler and Intracardiac            Opacification Agent (Both Spectral and Color Flow Doppler were            utilized during procedure). Indications:    Aortic stenosis I35.0  History:        Patient has prior history of Echocardiogram examinations, most                 recent 12/07/2023. Arrythmias:Atrial Fibrillation; Risk                 Factors:Hypertension.  Sonographer:    Tinnie Gosling RDCS Referring Phys: [id number] DORN FALCON BRANCH IMPRESSIONS  1. Left ventricular ejection fraction, by estimation, is 45 to 50%. The left ventricle has mildly decreased function. Left ventricular endocardial border not optimally defined to evaluate regional wall motion. There is severe left ventricular hypertrophy. Left ventricular diastolic parameters are indeterminate.  2. Right ventricular systolic function is normal. The right ventricular size is normal. Tricuspid regurgitation signal is inadequate for assessing PA pressure.  3. Left atrial size was mildly dilated.  4. The mitral valve is abnormal. Mild mitral valve regurgitation. Mild to moderate mitral stenosis. The mean mitral valve gradient is 5.0 mmHg. Moderate mitral annular calcification.  5. AVA VTI 0.61, mean gradient 28 mmHg, DI 0.23, SVI 23. Severe aortic stenosis  with lower than expected gradient due to decreased stroke volume index. . The aortic valve is tricuspid. There is moderate calcification of the aortic valve. There is moderate thickening of the aortic valve. Aortic valve regurgitation is not visualized. Severe aortic valve stenosis.  6. The inferior vena cava is dilated in size with >50% respiratory variability, suggesting right atrial pressure of 8 mmHg. FINDINGS  Left Ventricle: Left ventricular ejection fraction, by estimation, is 45 to 50%. The left ventricle has mildly decreased function. Left ventricular endocardial border not optimally defined to evaluate regional wall motion. The left ventricular internal cavity size was normal in size. There is severe left ventricular hypertrophy. Left ventricular diastolic parameters are indeterminate. Right Ventricle: The right ventricular size is normal. Right vetricular wall thickness was not well visualized. Right ventricular systolic function is normal. Tricuspid regurgitation signal is inadequate for assessing PA pressure. Left Atrium: Left atrial size  was mildly dilated. Right Atrium: Right atrial size was normal in size. Pericardium: There is no evidence of pericardial effusion. Mitral Valve: The mitral valve is abnormal. There is moderate thickening of the mitral valve leaflet(s). There is moderate calcification of the mitral valve leaflet(s). Moderate mitral annular calcification. Mild mitral valve regurgitation. Mild to moderate mitral valve stenosis. The mean mitral valve gradient is 5.0 mmHg. Tricuspid Valve: The tricuspid valve is not well visualized. Tricuspid valve regurgitation is not demonstrated. No evidence of tricuspid stenosis. Aortic Valve: AVA VTI 0.61, mean gradient 28 mmHg, DI 0.23, SVI 23. Severe aortic stenosis with lower than expected gradient due to decreased stroke volume index. The aortic valve is tricuspid. There is moderate calcification of the aortic valve. There is moderate thickening of  the aortic valve. There is moderate aortic valve annular calcification. Aortic valve regurgitation is not visualized. Severe aortic stenosis is present. Aortic valve mean gradient measures 27.5 mmHg. Aortic valve peak gradient measures 42.6 mmHg. Aortic valve area, by VTI measures 0.61 cm. Pulmonic Valve: The pulmonic valve was not well visualized. Pulmonic valve regurgitation is mild. No evidence of pulmonic stenosis. Aorta: The aortic root and ascending aorta are structurally normal, with no evidence of dilitation. Venous: The inferior vena cava is dilated in size with greater than 50% respiratory variability, suggesting right atrial pressure of 8 mmHg. IAS/Shunts: No atrial level shunt detected by color flow Doppler.  LEFT VENTRICLE PLAX 2D LVIDd:         4.40 cm   Diastology LVIDs:         3.50 cm   LV e' lateral:   3.70 cm/s LV PW:         1.50 cm   LV E/e' lateral: 45.4 LV IVS:        1.60 cm LVOT diam:     1.80 cm LV SV:         45 LV SV Index:   23 LVOT Area:     2.54 cm  RIGHT VENTRICLE            IVC RV S prime:     9.46 cm/s  IVC diam: 2.00 cm TAPSE (M-mode): 0.7 cm LEFT ATRIUM           Index        RIGHT ATRIUM           Index LA diam:      4.40 cm 2.28 cm/m   RA Area:     18.40 cm LA Vol (A4C): 66.2 ml 34.32 ml/m  RA Volume:   54.80 ml  28.41 ml/m  AORTIC VALVE AV Area (VTI):     0.61 cm AV Vmax:           326.49 cm/s AV Vmean:          249.200 cm/s AV VTI:            0.735 m AV Peak Grad:      42.6 mmHg AV Mean Grad:      27.5 mmHg LVOT VTI:          0.175 m LVOT/AV VTI ratio: 0.24  AORTA Ao Root diam: 2.60 cm Ao Asc diam:  3.10 cm MITRAL VALVE MV Area (PHT): 2.51 cm     SHUNTS MV Mean grad:  5.0 mmHg     Systemic VTI:  0.18 m MV Decel Time: 302 msec     Systemic Diam: 1.80 cm MV E velocity: 168.00 cm/s Dorn Ross MD Electronically  signed by Dorn Ross MD Signature Date/Time: 04/18/2024/12:30:37 PM    Final    US  Venous Img Lower Bilateral Result Date: 04/17/2024 EXAM: ULTRASOUND  DUPLEX OF THE BILATERAL LOWER EXTREMITY VEINS TECHNIQUE: Duplex ultrasound using B-mode/gray scaled imaging and Doppler spectral analysis and color flow was obtained of the deep venous structures of the bilateral lower extremity. COMPARISON: 02/23/2023 and previous. CLINICAL HISTORY: swelling FINDINGS: LEFT: The common femoral vein, femoral vein, popliteal vein, and posterior tibial vein of the left lower extremity demonstrate normal compressibility with normal color flow and spectral analysis. RIGHT: The common femoral vein, femoral vein, popliteal vein, and posterior tibial vein of the right lower extremity demonstrate normal compressibility with normal color flow and spectral analysis. IMPRESSION: 1. No evidence of deep venous thrombosis (DVT). Electronically signed by: Katheleen Faes MD 04/17/2024 05:06 PM EST RP Workstation: [id number]   DG Chest 2 View Result Date: 04/17/2024 CLINICAL DATA:  Congestive heart failure EXAM: CHEST - 2 VIEW COMPARISON:  Chest radiograph dated 04/06/2024 FINDINGS: Surgical clips project over the right apex. Normal lung volumes. No focal consolidations. Trace blunting of bilateral costophrenic angles. No pneumothorax. Enlarged cardiomediastinal silhouette is likely projectional. Septal occlusion device in-situ. No acute osseous abnormality. IMPRESSION: Trace blunting of bilateral costophrenic angles, which may represent trace pleural effusions. Electronically Signed   By: Limin  Xu M.D.   On: 04/17/2024 13:32   LONG TERM MONITOR (3-14 DAYS) Result Date: 04/14/2024 Predominant rhythm was sinus rhythm with sinus bradycardia First degree AV block was noted Paroxysmal atrial fib noted 12% burden, with the longest run being 27 hours. Post termination pause noted lasting 3.8 seconds.   DG Chest 2 View Result Date: 04/06/2024 EXAM: 2 VIEW(S) XRAY OF THE CHEST 04/06/2024 12:35:00 AM COMPARISON: CXR 04/25/2022 CLINICAL HISTORY: shortness of breath FINDINGS: LUNGS AND PLEURA: No  focal pulmonary opacity. No pulmonary edema. No pleural effusion. No pneumothorax. HEART AND MEDIASTINUM: No acute abnormality of the cardiac and mediastinal silhouettes. BONES AND SOFT TISSUES: No acute osseous abnormality. IMPRESSION: 1. No acute process. Electronically signed by: Greig Pique MD 04/06/2024 12:43 AM EST RP Workstation: [id number]   DG Chest 2 View Result Date: 04/03/2024 EXAM: 2 VIEW(S) XRAY OF THE CHEST 04/03/2024 11:10:53 PM COMPARISON: 05/06/2023 CLINICAL HISTORY: palpitations palpitations FINDINGS: LUNGS AND PLEURA: No focal pulmonary opacity. No pulmonary edema. No pleural effusion. No pneumothorax. HEART AND MEDIASTINUM: Aortic atherosclerosis. Borderline heart size. BONES AND SOFT TISSUES: No acute osseous abnormality. IMPRESSION: 1. No acute cardiopulmonary process identified. 2. Borderline cardiomegaly. Electronically signed by: Franky Crease MD 04/03/2024 11:40 PM EST RP Workstation: [id number]    Results are pending, will review when available.  Previous records (including but not limited to H&P, progress notes, nursing notes, TOC management) were reviewed in assessment of this patient.  Labs: CBC: Recent Labs  Lab 04/17/24 1259 04/18/24 0526 04/19/24 0522 04/20/24 0510  WBC 7.4 8.4 8.9 10.9*  HGB 10.7* 10.6* 12.3 10.1*  HCT 34.0* 32.9* 38.0 31.8*  MCV 95.5 94.3 93.4 94.6  PLT 251 234 235 207   Basic Metabolic Panel: Recent Labs  Lab 04/17/24 1259 04/18/24 0526 04/19/24 0522 04/20/24 0510  NA 139 140 138 140  K 4.1 3.5 3.8 3.3*  CL 102 102 99 100  CO2 28 26 23 29   GLUCOSE 152* 159* 286* 158*  BUN 33* 29* 31* 35*  CREATININE 0.78 0.81 0.99 0.88  CALCIUM 9.1 8.8* 8.9 8.5*  MG  --  2.1 1.8 2.2  PHOS  --  3.7  --   --  Liver Function Tests: Recent Labs  Lab 04/18/24 0526  AST 15  ALT 14  ALKPHOS 96  BILITOT 0.8  PROT 6.3*  ALBUMIN 4.0   CBG: Recent Labs  Lab 04/19/24 1138 04/19/24 1619 04/19/24 2122 04/20/24 0723 04/20/24 1131   GLUCAP 267* 207* 186* 163* 207*    Scheduled Meds:  amiodarone  200 mg Oral BID   Chlorhexidine  Gluconate Cloth  6 each Topical Daily   clopidogrel   75 mg Oral Daily   docusate sodium   100 mg Oral BID   ezetimibe   10 mg Oral Daily   furosemide   60 mg Intravenous BID   insulin  aspart  0-15 Units Subcutaneous TID WC   insulin  aspart  0-5 Units Subcutaneous QHS   levothyroxine   112 mcg Oral Q48H   levothyroxine   168 mcg Oral Q48H   [START ON 04/21/2024] metoprolol  succinate  50 mg Oral Daily   metoprolol  tartrate  25 mg Oral TID   polyethylene glycol  17 g Oral Daily   pravastatin   40 mg Oral q1800   rOPINIRole   0.5 mg Oral QHS   Continuous Infusions:   PRN Meds:.acetaminophen  **OR** acetaminophen , ipratropium-albuterol , LORazepam, mouth rinse, prochlorperazine  Family Communication: Patient's niece at bedside  Disposition: Status is: Inpatient Remains inpatient appropriate because: A-fib RVR/HFpEF  Time spent: 50 minutes  Length of inpatient stay: 2 days  Author: Eric Nunnery, MD 04/20/2024 2:38 PM  For on call review [email or web address].

## 2024-04-20 NOTE — Progress Notes (Signed)
 Heart Failure Navigator Progress Note  Assessed for Heart & Vascular TOC clinic readiness. [facility] patient awaiting a transfer to [facility] [address].  Navigator will sign-off at this time.  Charmaine Pines, RN, BSN [facility] Heart Failure Navigator Secure Chat Only

## 2024-04-20 NOTE — Progress Notes (Signed)
 Around 1500 IV on LFA removed with d/t pain, redness, and vein was hardened. Amio was infusing at 60mg /hr. Pharmacy notified per protocol. Will continue to assess site for next few hours per recommendation.   Pt states site already feels better. Site is less reddened at this time. Will continue to monitor and pass of info to night shift.

## 2024-04-20 NOTE — H&P (View-Only) (Signed)
 Recurrent afib this afternoon, we had stopped amio drip this AM as she was back in SR. Will reload IV amiodarone .  JINNY Ross MD

## 2024-04-20 NOTE — Plan of Care (Signed)

## 2024-04-20 NOTE — Progress Notes (Signed)
 1445: Patient noted to convert into a-fib with RVR on tele. This RN came to bedside and EKG was completed. Patient stated that she felt uneasy but denies palpitations and chest pain. PO 25mg  metoprolol  given. MD was made aware.  1505: 5mg  IV metoprolol  ordered. Patient has no complaints at this time. HR 136. BP 95/69.   1513: Amio gtt to be restarted. [facility] called and is 15 minutes out.

## 2024-04-21 ENCOUNTER — Encounter (HOSPITAL_COMMUNITY)
Admission: EM | Disposition: A | Discharge status: Home / Self Care | Payer: Self-pay | Source: Home / Self Care | Attending: Internal Medicine

## 2024-04-21 DIAGNOSIS — I2510 Atherosclerotic heart disease of native coronary artery without angina pectoris: Secondary | ICD-10-CM

## 2024-04-21 DIAGNOSIS — I350 Nonrheumatic aortic (valve) stenosis: Secondary | ICD-10-CM

## 2024-04-21 DIAGNOSIS — R600 Localized edema: Secondary | ICD-10-CM

## 2024-04-21 DIAGNOSIS — I480 Paroxysmal atrial fibrillation: Secondary | ICD-10-CM

## 2024-04-21 DIAGNOSIS — E039 Hypothyroidism, unspecified: Secondary | ICD-10-CM

## 2024-04-21 DIAGNOSIS — Z9889 Other specified postprocedural states: Secondary | ICD-10-CM

## 2024-04-21 DIAGNOSIS — E785 Hyperlipidemia, unspecified: Secondary | ICD-10-CM

## 2024-04-21 DIAGNOSIS — E1159 Type 2 diabetes mellitus with other circulatory complications: Secondary | ICD-10-CM

## 2024-04-21 DIAGNOSIS — Z95820 Peripheral vascular angioplasty status with implants and grafts: Secondary | ICD-10-CM

## 2024-04-21 DIAGNOSIS — E66812 Obesity, class 2: Secondary | ICD-10-CM

## 2024-04-21 HISTORY — PX: RIGHT/LEFT HEART CATH AND CORONARY ANGIOGRAPHY: CATH118266

## 2024-04-21 LAB — POCT I-STAT EG7
Acid-Base Excess: 5 mmol/L — ABNORMAL HIGH (ref 0.0–2.0)
Acid-Base Excess: 6 mmol/L — ABNORMAL HIGH (ref 0.0–2.0)
Bicarbonate: 30.7 mmol/L — ABNORMAL HIGH (ref 20.0–28.0)
Bicarbonate: 31.6 mmol/L — ABNORMAL HIGH (ref 20.0–28.0)
Calcium, Ion: 1.06 mmol/L — ABNORMAL LOW (ref 1.15–1.40)
Calcium, Ion: 1.14 mmol/L — ABNORMAL LOW (ref 1.15–1.40)
HCT: 34 % — ABNORMAL LOW (ref 36.0–46.0)
HCT: 34 % — ABNORMAL LOW (ref 36.0–46.0)
Hemoglobin: 11.6 g/dL — ABNORMAL LOW (ref 12.0–15.0)
Hemoglobin: 11.6 g/dL — ABNORMAL LOW (ref 12.0–15.0)
O2 Saturation: 48 %
O2 Saturation: 54 %
Potassium: 3.6 mmol/L (ref 3.5–5.1)
Potassium: 3.6 mmol/L (ref 3.5–5.1)
Sodium: 132 mmol/L — ABNORMAL LOW (ref 135–145)
Sodium: 137 mmol/L (ref 135–145)
TCO2: 32 mmol/L (ref 22–32)
TCO2: 33 mmol/L — ABNORMAL HIGH (ref 22–32)
pCO2, Ven: 47.4 mmHg (ref 44–60)
pCO2, Ven: 47.6 mmHg (ref 44–60)
pH, Ven: 7.419 (ref 7.25–7.43)
pH, Ven: 7.43 (ref 7.25–7.43)
pO2, Ven: 26 mmHg — CL (ref 32–45)
pO2, Ven: 28 mmHg — CL (ref 32–45)

## 2024-04-21 LAB — POCT I-STAT 7, (LYTES, BLD GAS, ICA,H+H)
Acid-Base Excess: 0 mmol/L (ref 0.0–2.0)
Bicarbonate: 24.5 mmol/L (ref 20.0–28.0)
Calcium, Ion: 0.92 mmol/L — ABNORMAL LOW (ref 1.15–1.40)
HCT: 30 % — ABNORMAL LOW (ref 36.0–46.0)
Hemoglobin: 10.2 g/dL — ABNORMAL LOW (ref 12.0–15.0)
O2 Saturation: 88 %
Potassium: 3 mmol/L — ABNORMAL LOW (ref 3.5–5.1)
Sodium: 145 mmol/L (ref 135–145)
TCO2: 26 mmol/L (ref 22–32)
pCO2 arterial: 39.6 mmHg (ref 32–48)
pH, Arterial: 7.399 (ref 7.35–7.45)
pO2, Arterial: 55 mmHg — ABNORMAL LOW (ref 83–108)

## 2024-04-21 LAB — POCT ACTIVATED CLOTTING TIME
Activated Clotting Time: 170 s
Activated Clotting Time: 210 s

## 2024-04-21 LAB — BASIC METABOLIC PANEL WITH GFR
Anion gap: 14 (ref 5–15)
BUN: 29 mg/dL — ABNORMAL HIGH (ref 8–23)
CO2: 24 mmol/L (ref 22–32)
Calcium: 8.4 mg/dL — ABNORMAL LOW (ref 8.9–10.3)
Chloride: 98 mmol/L (ref 98–111)
Creatinine, Ser: 0.74 mg/dL (ref 0.44–1.00)
GFR, Estimated: >60 mL/min (ref >60)
Glucose, Bld: 174 mg/dL — ABNORMAL HIGH (ref 70–99)
Potassium: 3.7 mmol/L (ref 3.5–5.1)
Sodium: 136 mmol/L (ref 135–145)

## 2024-04-21 LAB — GLUCOSE, CAPILLARY
Glucose-Capillary: 175 mg/dL — ABNORMAL HIGH (ref 70–99)
Glucose-Capillary: 166 mg/dL — ABNORMAL HIGH (ref 70–99)
Glucose-Capillary: 201 mg/dL — ABNORMAL HIGH (ref 70–99)
Glucose-Capillary: 136 mg/dL — ABNORMAL HIGH (ref 70–99)

## 2024-04-21 LAB — MAGNESIUM: Magnesium: 1.8 mg/dL (ref 1.7–2.4)

## 2024-04-21 LAB — TSH: TSH: 1.179 u[IU]/mL (ref 0.350–4.500)

## 2024-04-21 SURGERY — RIGHT/LEFT HEART CATH AND CORONARY ANGIOGRAPHY
Anesthesia: LOCAL

## 2024-04-21 MED ORDER — SODIUM CHLORIDE 0.9% FLUSH: 3.0000 mL | INTRAVENOUS | Status: DC | PRN

## 2024-04-21 MED ORDER — LIDOCAINE HCL (PF) 1 % IJ SOLN
INTRAMUSCULAR | Status: DC | PRN
  Administered 2024-04-21: 2 mL via INTRADERMAL
  Administered 2024-04-21: 5 mL via INTRADERMAL
  Administered 2024-04-21: 2 mL via INTRADERMAL

## 2024-04-21 MED ORDER — BISACODYL 5 MG PO TBEC
5.0000 mg | DELAYED_RELEASE_TABLET | Freq: Every day | ORAL | Status: DC | PRN
Start: 2024-04-21 — End: 2024-04-25
  Administered 2024-04-21: 5 mg via ORAL
  Filled 2024-04-21: qty 1

## 2024-04-21 MED ORDER — SODIUM CHLORIDE 0.9% FLUSH
3.0000 mL | Freq: Two times a day (BID) | INTRAVENOUS | Status: DC
  Administered 2024-04-21 – 2024-04-25 (×9): 3 mL via INTRAVENOUS

## 2024-04-21 MED ORDER — VERAPAMIL HCL 2.5 MG/ML IV SOLN
INTRAVENOUS | Status: AC
Start: 2024-04-21 — End: 2024-04-21
  Filled 2024-04-21: qty 2

## 2024-04-21 MED ORDER — FENTANYL CITRATE (PF) 100 MCG/2ML IJ SOLN
INTRAMUSCULAR | Status: AC
  Filled 2024-04-21: qty 2

## 2024-04-21 MED ORDER — HEPARIN SODIUM (PORCINE) 1000 UNIT/ML IJ SOLN
INTRAMUSCULAR | Status: DC | PRN
  Administered 2024-04-21: 4000 [IU] via INTRAVENOUS

## 2024-04-21 MED ORDER — MIDAZOLAM HCL (PF) 2 MG/2ML IJ SOLN
INTRAMUSCULAR | Status: DC | PRN
  Administered 2024-04-21: 1 mg via INTRAVENOUS

## 2024-04-21 MED ORDER — VERAPAMIL HCL 2.5 MG/ML IV SOLN
INTRAVENOUS | Status: DC | PRN
  Administered 2024-04-21: 10 mL via INTRA_ARTERIAL

## 2024-04-21 MED ORDER — IOHEXOL 350 MG/ML SOLN
INTRAVENOUS | Status: DC | PRN
Start: 2024-04-21 — End: 2024-04-21
  Administered 2024-04-21: 30 mL

## 2024-04-21 MED ORDER — DIGOXIN 0.25 MG/ML IJ SOLN
0.1250 mg | Freq: Four times a day (QID) | INTRAMUSCULAR | Status: AC
  Administered 2024-04-21 – 2024-04-22 (×2): 0.125 mg via INTRAVENOUS
  Filled 2024-04-21 (×2): qty 0.5

## 2024-04-21 MED ORDER — SODIUM CHLORIDE 0.9 % IV SOLN: 250.0000 mL | INTRAVENOUS | Status: AC | PRN

## 2024-04-21 MED ORDER — MAGNESIUM SULFATE 2 GM/50ML IV SOLN
2.0000 g | Freq: Once | INTRAVENOUS | Status: AC
  Administered 2024-04-21: 2 g via INTRAVENOUS
  Filled 2024-04-21: qty 50

## 2024-04-21 MED ORDER — POTASSIUM CHLORIDE CRYS ER 20 MEQ PO TBCR
40.0000 meq | EXTENDED_RELEASE_TABLET | ORAL | Status: AC
  Administered 2024-04-21 (×2): 40 meq via ORAL
  Filled 2024-04-21 (×2): qty 2

## 2024-04-21 MED ORDER — HEPARIN SODIUM (PORCINE) 1000 UNIT/ML IJ SOLN
INTRAMUSCULAR | Status: AC
  Filled 2024-04-21: qty 10

## 2024-04-21 MED ORDER — HEPARIN (PORCINE) IN NACL 1000-0.9 UT/500ML-% IV SOLN
INTRAVENOUS | Status: DC | PRN
Start: 2024-04-21 — End: 2024-04-21
  Administered 2024-04-21 (×2): 500 mL

## 2024-04-21 MED ORDER — DILTIAZEM HCL 30 MG PO TABS
30.0000 mg | ORAL_TABLET | Freq: Four times a day (QID) | ORAL | Status: DC
  Administered 2024-04-21 – 2024-04-23 (×6): 30 mg via ORAL
  Filled 2024-04-21 (×7): qty 1

## 2024-04-21 MED ORDER — DIGOXIN 125 MCG PO TABS
0.1250 mg | ORAL_TABLET | Freq: Every day | ORAL | Status: DC
  Administered 2024-04-22: 0.125 mg via ORAL
  Filled 2024-04-21: qty 1

## 2024-04-21 MED ORDER — DIGOXIN 125 MCG PO TABS: 0.2500 mg | ORAL_TABLET | Freq: Every day | ORAL | Status: DC

## 2024-04-21 MED ORDER — MIDAZOLAM HCL 2 MG/2ML IJ SOLN
INTRAMUSCULAR | Status: AC
Start: 2024-04-21 — End: 2024-04-21
  Filled 2024-04-21: qty 2

## 2024-04-21 MED ORDER — HYDRALAZINE HCL 20 MG/ML IJ SOLN: 10.0000 mg | INTRAMUSCULAR | Status: AC | PRN

## 2024-04-21 MED ORDER — FENTANYL CITRATE (PF) 100 MCG/2ML IJ SOLN
INTRAMUSCULAR | Status: DC | PRN
  Administered 2024-04-21: 25 ug via INTRAVENOUS

## 2024-04-21 MED ORDER — LIDOCAINE HCL (PF) 1 % IJ SOLN
INTRAMUSCULAR | Status: AC
Start: 2024-04-21 — End: 2024-04-21
  Filled 2024-04-21: qty 30

## 2024-04-21 SURGICAL SUPPLY — 15 items
CATH BALLN WEDGE 5F 110CM (CATHETERS) IMPLANT
CATH INFINITI 5 FR JL3.5 (CATHETERS) IMPLANT
CATH INFINITI JR4 5F (CATHETERS) IMPLANT
CATH SWAN GANZ 7F STRAIGHT (CATHETERS) IMPLANT
DEVICE RAD COMP TR BAND LRG (VASCULAR PRODUCTS) IMPLANT
GLIDESHEATH SLEND SS 6F .021 (SHEATH) IMPLANT
GUIDEWIRE INQWIRE 1.5J.035X260 (WIRE) IMPLANT
PACK CARDIAC CATHETERIZATION (CUSTOM PROCEDURE TRAY) ×1 IMPLANT
SET ATX-X65L (MISCELLANEOUS) IMPLANT
SHEATH GLIDE SLENDER 4/5FR (SHEATH) IMPLANT
SHEATH PINNACLE 5F 10CM (SHEATH) IMPLANT
SHEATH PINNACLE 7F 10CM (SHEATH) IMPLANT
SHEATH PROBE COVER 6X72 (BAG) IMPLANT
WIRE EMERALD 3MM-J .025X260CM (WIRE) IMPLANT
WIRE MICRO SET SILHO 5FR 7 (SHEATH) IMPLANT

## 2024-04-21 NOTE — Interval H&P Note (Signed)
 History and Physical Interval Note:  04/21/2024 9:45 AM  Emily [name]  has presented today for surgery, with the diagnosis of aortic stenosis.  The various methods of treatment have been discussed with the patient and family. After consideration of risks, benefits and other options for treatment, the patient has consented to  Procedure(s): RIGHT/LEFT HEART CATH AND CORONARY ANGIOGRAPHY (N/A) as a surgical intervention.  The patient's history has been reviewed, patient examined, no change in status, stable for surgery.  I have reviewed the patient's chart and labs.  Questions were answered to the patient's satisfaction.    Cath Lab Visit (complete for each Cath Lab visit)  Clinical Evaluation Leading to the Procedure:   ACS: No.  Non-ACS:    Anginal Classification: CCS II  Anti-ischemic medical therapy: Maximal Therapy (2 or more classes of medications)  Non-Invasive Test Results: No non-invasive testing performed  Prior CABG: No previous CABG        Lonni Cash

## 2024-04-21 NOTE — Progress Notes (Addendum)
 Progress Note  Patient Name: Emily [name] Date of Encounter: 04/21/2024 [facility] Cardiologist: Lynwood Schilling, MD   Interval Summary   Emily [name] is a 73 y.o. female with a hx of PAF, aortic stenosis who is being seen 04/18/2024 for the evaluation of afib with RVR.   She has history of GI bleed and has undergone Watchman device implantation to avoid anticoagulation.  She underwent cardiac catheterization revealing severe aortic stenosis with high-grade tandem stenosis in the proximal, mid and mid to distal RCA and mid LAD 80% stenosis and OM1 95% stenosis and hence felt that she should have CABG along with bypass surgery.  She is now admitted to the hospital for further optimization of heart failure and A-fib with RVR.  Past medical history significant for hypertension, hypercholesterolemia, OSA not compliant with CPAP and morbid obesity.  Vital Signs Vitals:   04/21/24 1125 04/21/24 1130 04/21/24 1145 04/21/24 1212  BP: 121/82 (!) 137/97 (!) 117/92 (!) 112/59  Pulse: (!) 135 (!) 121 (!) 117 (!) 121  Resp: (!) 21 20 (!) 24 20  Temp:    97.6 F (36.4 C)  TempSrc:    Oral  SpO2: 93% 91% 94% 93%  Weight:      Height:        Intake/Output Summary (Last 24 hours) at 04/21/2024 1614 Last data filed at 04/21/2024 1226 Gross per 24 hour  Intake 363 ml  Output --  Net 363 ml      04/21/2024    5:00 AM 04/20/2024    4:28 PM 04/19/2024    8:00 AM  Last 3 Weights  Weight (lbs) 201 lb 202 lb 12.8 oz 202 lb 13.2 oz  Weight (kg) 91.173 kg 91.989 kg 92 kg      Telemetry/ECG  A. Fib with RVR @ 140-150/min - Personally Reviewed  Physical Exam Neck:     Vascular: No carotid bruit or JVD.  Cardiovascular:     Rate and Rhythm: Tachycardia present. Rhythm irregular.     Heart sounds: Murmur heard.     Harsh midsystolic murmur is present with a grade of 3/6 at the upper right sternal border.  Pulmonary:     Effort: Pulmonary effort is normal.     Breath  sounds: Examination of the right-lower field reveals rales. Examination of the left-lower field reveals rales. Rales present.  Abdominal:     General: Abdomen is protuberant. Bowel sounds are normal.     Palpations: Abdomen is soft.  Musculoskeletal:     Right lower leg: No edema.     Left lower leg: No edema.  Skin:    Capillary Refill: Capillary refill takes less than 2 seconds.      Assessment & Plan   Emily [name] is a 73 y.o. female with a hx of PAF, aortic stenosis who is being seen 04/18/2024 for the evaluation of afib with RVR at the request of Dr . Manfred   Atrial fibrillation with RVR Hx of PAF -- s/p watchman, has been off anticoagulation 2/2 recurrent GI bleeds -- failed flecanide, previousl admission 11/4 with afib RVR but converted without meds. Started on amio 200mg  daily -- presented back with atrial fibrillation with RVR, initially placed on IV Dilt, but converted to IV amio. Converted to SR the morning of 11/20 but back into afib that afternoon, therefore IV amio resumed  -- continue Toprol  XL 50mg  daily - Digitalize patient - Add diltiazem  short acting in case she develops heart block to discontinue. -  Patient with rapid ventricular response, heart rate around 140 to 150 bpm and spite of Amio load.  2. Aortic Stenosis -- 11/2023 echo: LVE 60-65%, no WMAs, severe asymmetrical LVH, normal RV, severe LAE  AVA VTI 0.74 mean grad 36 DI 0.23 SVI 42. Moderate to severe AS with majority of criteria favoring severe.  -- 04/2024 echo: LVE 45-50%, indet diastolic, normal RV function, mild to mod MS mean grad 5, severe AS mean grad 28 AVA VTI 0.61 DI 0.23 SVI 23. Lower than expected gradient due to low SVI.  -- transferred to [facility] and underwent cardiac cath today with multivessel CAD. CT surgery consulted for consideration of CABG/ AVR - Dr. Con Clunes, CT surgery at the bedside, we discussed options, felt that she will probably would be high risk for surgery however will  also evaluate for TAVR and PCI options as well once she is optimized.  Plan on getting CT chest TAVR protocol Monday once she is more stable with heart rate and heart failure symptoms have improved.  3. Acute on chronic HFpEF with newly reduced EF -- Echo this admission with drop in LVEF to 45-50%, normal RV -- CXR with trace pleural effusions and BNP 6770 on admission -- on IV lasix  60mg  BID, net - 4L -- RHC: PWP 30-52mmHg, CI 2.62/CO 5.01 - She did not tolerate SGLT2 inhibitors due to recurrent UTIs. -Will add spironolactone  12.5 mg daily.  Follow-up serum creatinine.  If she tolerates this we can uptitrate.  4. 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  Heart catheterization 04/21/2024: Elevated right heart pressure with pulmonary capillary wedge of 30 to 35 mmHg. Multivessel coronary disease. Severe three vessel CAD Moderate calcified stenosis throughout the proximal LAD. Severe mid LAD stenosis Severe calcified stenosis in the large caliber obtuse marginal branch Dominant RCA with severe calcified lesion in the proximal and mid vessel.     5. Per primary OSA: Non compliant DM Obesity   For questions or updates, please contact [facility] Please consult [email or web address] for contact info under    Gordy Bergamo, MD, [facility] 04/21/2024, 5:44 PM [facility] [address] [place], [place] [address] Phone: [phone]. Fax:  [phone]

## 2024-04-21 NOTE — Consult Note (Cosign Needed Addendum)
 [address]       [place] [address]             [phone]        Emily [name] [facility] Medical Record #[id number] Date of Birth: [birth year]  Referring: Emily [name] Primary Care: Emily [name] HERO, DO Primary Cardiologist:Emily Lavona, MD  Chief Complaint:    Chief Complaint  Patient presents with   Leg Swelling   History of Present Illness:      Emily [name] is a 73 yo female with history of Paroxysmal Atrial Fibrillation, H/O Watchman Device, H/O of GI Bleeding on anticoagulation Type DM, Severe Aortic Stenosis, HTN, and HLD.  She presented to the Emergency Department via EMS after contacting her PCP with complaints of swelling/edema and shortness of breath with activity.  They recommended she presented to the Emergency Department for IV Lasix .  Upon arrival EKG was obtained and showed Atrial Fibrillation with RVR, RBBB, and LVH.  Remaining workup in the ED showed questionable pleural effusion.  She was treated with IV Lasix  and Cardizem . She was admitted for further care and treatment.  She was transitioned to IV Amiodarone  and converted to NSR.  Echocardiogram was obtained and shows reduced EF of 45-50% with severe Aortic Stenosis.  She underwent catheterization which showed disease in her RCA and OM.  Of note she was due to see Dr. Wonda for TAVR workup, but ended up admitted to the hospital.  Currently the patient is doing better.  She denies pain and shortness of breath at this time.  She states she has been having issues with Atrial Fibrillation off and on over the past several weeks.  She states that she mainly experiences pain in her back, but when she is having A.Fibrillation she has a lot of pain in her neck area.  She is short of breath with activity and states that if she is walking without a shopping cart she is unable to walk very far.  She is a lifelong non-smoker.  She was last seen by her Dentist about 6 months ago without issue.  She  states she had to cancer her appointment because she is in the hospital.  She lives alone and would not have help after surgery.  Current Activity/ Functional Status: Patient is independent with mobility/ambulation, transfers, ADL's, IADL's.   Zubrod Score: At the time of surgery this patient's most appropriate activity status/level should be described as: []     0    Normal activity, no symptoms []     1    Restricted in physical strenuous activity but ambulatory, able to do out light work []     2    Ambulatory and capable of self care, unable to do work activities, up and about                 more than 50%  Of the time                            []     3    Only limited self care, in bed greater than 50% of waking hours []     4    Completely disabled, no self care, confined to bed or chair []     5    Moribund  Past Medical History:  Diagnosis Date   Anemia    Arthritis    Atrial fibrillation, rapid (HCC) 10/27/2013   Bilateral carotid artery  disease    Complication of anesthesia    difficult intubation in past   Diabetes mellitus without complication (HCC)    type 2   Difficult intubation    states 'lady that did the sleep study told me I have the smallest airway she has ever seen in an adult   Dyslipidemia 06/29/2017   Heart murmur    History of blood transfusion 09/2019   GI bleed - in [facility]   Hypertension    Hypothyroid    Obesity    Obstructive sleep apnea    occasional uses CPAP   Peripheral vascular disease    Persistent atrial fibrillation (HCC) 01/16/2020   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 - xrays were negative   Upper GI bleed 09/25/2019    Past Surgical History:  Procedure Laterality Date   CATARACT EXTRACTION Left    CATARACT EXTRACTION W/PHACO Right 12/09/2015   Procedure: CATARACT EXTRACTION PHACO AND INTRAOCULAR LENS PLACEMENT (IOC);  Surgeon: Emily Mania, MD;  Location: [facility];  Service: Ophthalmology;  Laterality: Right;   CDE:10.48   COLONOSCOPY W/ POLYPECTOMY     cyst on back of neck removed     DILATION AND CURETTAGE OF UTERUS     x 5   ENDARTERECTOMY Left 08/30/2015   Procedure: LEFT CAROYID ENDARTERECTOMY WITH XENOSURE BOVINE PERICARDIUM PATCH ANGIOPLASTY;  Surgeon: Emily [name] New, MD;  Location: [facility];  Service: Vascular;  Laterality: Left;   TONSILLECTOMY     TRANSCAROTID ARTERY REVASCULARIZATION  Right 03/19/2023   Procedure: Right Transcarotid Artery Revascularization;  Surgeon: [name] Emily LELON, MD;  Location: [facility];  Service: Vascular;  Laterality: Right;   ULTRASOUND GUIDANCE FOR VASCULAR ACCESS Left 03/19/2023   Procedure: ULTRASOUND GUIDANCE FOR VASCULAR ACCESS;  Surgeon: [name] Emily LELON, MD;  Location: [facility];  Service: Vascular;  Laterality: Left;   UPPER GI ENDOSCOPY     x several    Social History   Tobacco Use  Smoking Status Never   Passive exposure: Never  Smokeless Tobacco Never    Social History   Substance and Sexual Activity  Alcohol Use No   Alcohol/week: 0.0 standard drinks of alcohol     Allergies  Allergen Reactions   Statins Other (See Comments) and Cough    Not all statins but some cause cough and pain in legs.   Jardiance  [Empagliflozin ] Other (See Comments)    Recurrent vaginitis   Quinapril Hcl Cough   Tape Other (See Comments)    Redness, please use paper tape    Current Facility-Administered Medications  Medication Dose Route Frequency Provider Last Rate Last Admin   [MAR Hold] acetaminophen  (TYLENOL ) tablet 650 mg  650 mg Oral Q6H PRN Emily Fines, MD   650 mg at 04/18/24 2043   Or   [MAR Hold] acetaminophen  (TYLENOL ) suppository 650 mg  650 mg Rectal Q6H PRN Emily Fines, MD       amiodarone  (NEXTERONE  PREMIX) 360-4.14 MG/200ML-% (1.8 mg/mL) IV infusion  30 mg/hr Intravenous Continuous Emily Dorn FALCON, MD 16.67 mL/hr at 04/21/24 0037 30 mg/hr at 04/21/24 0037   [MAR Hold] docusate sodium  (COLACE) capsule 100 mg  100 mg Oral BID Emily Fines, MD   100 mg at 04/21/24 0844   [MAR Hold] ezetimibe  (ZETIA ) tablet 10 mg  10 mg Oral Daily Emily Fines, MD   10 mg at 04/21/24 0844   fentaNYL  (SUBLIMAZE ) injection    PRN Emily [name] Lonni BIRCH, MD   25 mcg  at 04/21/24 0954   [MAR Hold] furosemide  (LASIX ) injection 60 mg  60 mg Intravenous BID Emily Fines, MD   60 mg at 04/21/24 9157   Heparin  (Porcine) in NaCl 1000-0.9 UT/500ML-% SOLN    PRN Emily [name] Lonni BIRCH, MD   500 mL at 04/21/24 1007   heparin  sodium (porcine) injection    PRN Emily [name] Lonni BIRCH, MD   4,000 Units at 04/21/24 1038   [MAR Hold] insulin  aspart (novoLOG ) injection 0-15 Units  0-15 Units Subcutaneous TID WC Emily Fines, MD   3 Units at 04/21/24 0842   [MAR Hold] insulin  aspart (novoLOG ) injection 0-5 Units  0-5 Units Subcutaneous QHS Emily Fines, MD   2 Units at 04/20/24 2226   iohexol  (OMNIPAQUE ) 350 MG/ML injection    PRN Emily [name] Lonni BIRCH, MD   30 mL at 04/21/24 1050   [MAR Hold] ipratropium-albuterol  (DUONEB) 0.5-2.5 (3) MG/3ML nebulizer solution 3 mL  3 mL Nebulization Q4H PRN Emily Fines, MD       [MAR Hold] levothyroxine  (SYNTHROID ) tablet 112 mcg  112 mcg Oral Q48H Emily Fines, MD   112 mcg at 04/20/24 0546   [MAR Hold] levothyroxine  (SYNTHROID ) tablet 168 mcg  168 mcg Oral Q48H Emily Fines, MD   168 mcg at 04/21/24 0552   lidocaine  (PF) (XYLOCAINE ) 1 % injection    PRN Emily [name] Lonni BIRCH, MD   5 mL at 04/21/24 1014   [MAR Hold] LORazepam  (ATIVAN ) injection 0.5 mg  0.5 mg Intravenous Q6H PRN Emily Fines, MD   0.5 mg at 04/18/24 2251   [MAR Hold] metoprolol  succinate (TOPROL -XL) 24 hr tablet 50 mg  50 mg Oral Daily Emily Fines, MD   50 mg at 04/21/24 0844   midazolam  PF (VERSED ) injection    PRN Emily [name] Lonni BIRCH, MD   1 mg at 04/21/24 0954   [facility] Hold] Oral care mouth rinse  15 mL Mouth Rinse PRN Emily Fines, MD       [facility] Hold] polyethylene glycol (MIRALAX  / GLYCOLAX ) packet 17 g  17 g Oral Daily Emily Fines, MD   17 g at 04/21/24 0845   [MAR Hold] pravastatin  (PRAVACHOL ) tablet 40 mg  40 mg Oral q1800 Emily Fines, MD   40 mg at 04/20/24 1824   [MAR Hold] prochlorperazine  (COMPAZINE ) injection 10 mg  10 mg Intravenous Q6H PRN Emily Fines, MD       Radial Cocktail/Verapamil  only    PRN Emily [name] Lonni BIRCH, MD   10 mL at 04/21/24 1013   [MAR Hold] rOPINIRole  (REQUIP ) tablet 0.5 mg  0.5 mg Oral QHS Emily Fines, MD   0.5 mg at 04/20/24 2226    Facility-Administered Medications Prior to Admission  Medication Dose Route Frequency Provider Last Rate Last Admin   cyanocobalamin  ((VITAMIN B-12)) injection 1,000 mcg  1,000 mcg Intramuscular Q30 days Harl Jayson CROME, MD   1,000 mcg at 03/21/24 1051   Medications Prior to Admission  Medication Sig Dispense Refill Last Dose/Taking   amiodarone  (PACERONE ) 200 MG tablet Take 1 tablet (200 mg total) by mouth daily. 30 tablet 0 04/17/2024 Morning   clopidogrel  (PLAVIX ) 75 MG tablet TAKE 1 TABLET BY MOUTH EVERY DAY 90 tablet 2 04/17/2024 at  7:30 AM   diltiazem  (CARDIZEM ) 30 MG tablet Take 1 tablet (30 mg total) by mouth 3 (three) times daily as needed (afib). 30 tablet 0 Past Week   diltiazem  (CARTIA  XT) 240 MG 24 hr capsule Take 1 capsule (240 mg total) by mouth daily. 30  capsule 0 04/17/2024 Morning   diphenhydramine -acetaminophen  (TYLENOL  PM) 25-500 MG TABS tablet Take 1 tablet by mouth at bedtime as needed (pain).   Past Month   ezetimibe  (ZETIA ) 10 MG tablet TAKE 1 TABLET BY MOUTH EVERY DAY 90 tablet 2 04/17/2024 Morning   fluticasone  (FLONASE ) 50 MCG/ACT nasal spray Place 2 sprays into both nostrils daily. 16 g 6 Past Week   furosemide  (LASIX ) 20 MG tablet Take 1 tablet (20 mg total) by mouth daily as needed for fluid. 90 tablet 3 04/17/2024 Morning   gabapentin  (NEURONTIN ) 300 MG capsule TAKE 1 CAPSULE (300 MG TOTAL) BY MOUTH 2 (TWO) TIMES DAILY AS NEEDED (NEUROPATHY RIGHT LEG). (Patient taking differently: Take 300 mg by mouth 2 (two)  times daily.) 180 capsule 0 04/17/2024 Morning   levonorgestrel (MIRENA) 20 MCG/DAY IUD 1 each by Intrauterine route once.   Unknown   levothyroxine  (SYNTHROID ) 112 MCG tablet TAKE 1 TABLET BY MOUTH EVERY OTHER DAY, ALTERNATING WITH 1 & 1/2 TABLETS EVERY OTHER DAY (Patient taking differently: Take 112 mcg by mouth See admin instructions. TAKE 1 TABLET BY MOUTH EVERY OTHER DAY, ALTERNATING WITH 1 & 1/2 TABLETS EVERY OTHER DAY) 112 tablet 3 04/17/2024 Morning   linaclotide  (LINZESS ) 290 MCG CAPS capsule Take 1 capsule (290 mcg total) by mouth daily before breakfast.   Past Week   metFORMIN  (GLUCOPHAGE ) 1000 MG tablet Take 1 tablet (1,000 mg total) by mouth 2 (two) times daily with a meal. (Patient taking differently: Take 500 mg by mouth 2 (two) times daily with a meal.) 180 tablet 3 04/17/2024 Morning   metoprolol  succinate (TOPROL -XL) 50 MG 24 hr tablet Take 1 tablet (50 mg total) by mouth at bedtime. Hold if systolic BP  less than 100 or HR less than 50 bpm 90 tablet 3 04/16/2024   polyethylene glycol (MIRALAX  / GLYCOLAX ) 17 g packet Take 17 g by mouth daily as needed for moderate constipation.   Past Week   rOPINIRole  (REQUIP ) 0.5 MG tablet TAKE 1 TABLET BY MOUTH AT BEDTIME. 90 tablet 0 04/16/2024   Semaglutide ,0.25 or 0.5MG /DOS, 2 MG/3ML SOPN Inject 0.5 mg into the skin once a week. (Patient taking differently: Inject 0.5 mg into the skin every Monday.) 3 mL  04/17/2024 Morning   losartan  (COZAAR ) 25 MG tablet Take 1 tablet (25 mg total) by mouth daily. 90 tablet 2 04/17/2024 Morning   lovastatin  (MEVACOR ) 40 MG tablet Take 1 tablet (40 mg total) by mouth every evening. 90 tablet 3 04/16/2024    Family History  Problem Relation Age of Onset   COPD Father    Heart failure Father    Heart disease Father    Emphysema Father    Arrhythmia Sister    Arrhythmia Sister    Arrhythmia Sister        had PPM also   Cancer Sister      Review of Systems:   ROS     Cardiac Review of Systems: Y  or  [    ]= no  Chest Pain [    ]  Resting SOB [   ] Exertional SOB  [ Y ]  Orthopnea [  ]   Pedal Edema [Y   ]    Palpitations [Y  ] Syncope  [  ]   Presyncope [   ]  General Review of Systems: [Y] = yes [  ]=no Constitional: recent weight change [  ]; anorexia [  ]; fatigue [ Y ]; nausea [  ];  night sweats [  ]; fever [  ]; or chills [  ]                                                               Dental: Last Dentist visit: about 6 months ago  Eye : blurred vision [  ]; diplopia [   ]; vision changes [  ];  Amaurosis fugax[  ]; Resp: cough [  ];  wheezing[  ];  hemoptysis[  ]; shortness of breath[  ]; paroxysmal nocturnal dyspnea[  ]; dyspnea on exertion[Y  ]; or orthopnea[  ];  GI:  gallstones[  ], vomiting[  ];  dysphagia[  ]; melena[  ];  hematochezia [  ]; heartburn[  ];   Hx of  Colonoscopy[  ]; GU: kidney stones [  ]; hematuria[  ];   dysuria [  ];  nocturia[  ];  history of     obstruction [  ]; urinary frequency [  ]             Skin: rash, swelling[ N ];, hair loss[  ];  peripheral edema[ Y ];  or itching[  ]; Musculosketetal: myalgias[  ];  joint swelling[  ];  joint erythema[  ];  joint pain[  ];  back pain[ Y ];  Heme/Lymph: bruising[  ];  bleeding[  ];  anemia[  ];  Neuro: TIA[  ];  headaches[  ];  stroke[ N ];  vertigo[  ];  seizures[  ];   paresthesias[  ];  difficulty walking[  ];  Psych:depression[  ]; anxiety[  ];  Endocrine: diabetes[ Y ];  thyroid  dysfunction[ Y ];    Physical Exam: BP (!) 115/98   Pulse (!) 0   Temp 97.7 F (36.5 C) (Oral)   Resp 19   Ht 5' 2 (1.575 m)   Wt 91.2 kg   SpO2 93%   BMI 36.76 kg/m   General appearance: alert, cooperative, no distress, and morbidly obese Head: Normocephalic, without obvious abnormality, atraumatic Neck: no adenopathy, no carotid bruit, no JVD, supple, symmetrical, trachea midline, and thyroid  not enlarged, symmetric, no tenderness/mass/nodules Resp: clear to auscultation bilaterally Cardio: regular rate and  rhythm and + systolic murmur GI: moderately sized pannus, non tender, non distended.. BS X 4 Extremities: extremities normal, atraumatic, no cyanosis or edema Neurologic: Grossly normal  Diagnostic Studies & Laboratory data:     Recent Radiology Findings:   No results found.   I have independently reviewed the above radiologic studies and discussed with the patient   Recent Lab Findings: Lab Results  Component Value Date   WBC 10.9 (H) 04/20/2024   HGB 10.1 (L) 04/20/2024   HCT 31.8 (L) 04/20/2024   PLT 207 04/20/2024   GLUCOSE 174 (H) 04/21/2024   CHOL 148 03/14/2024   TRIG 114 03/14/2024   HDL 39 (L) 03/14/2024   LDLDIRECT 89 06/03/2021   LDLCALC 88 03/14/2024   ALT 14 04/18/2024   AST 15 04/18/2024   NA 136 04/21/2024   K 3.7 04/21/2024   CL 98 04/21/2024   CREATININE 0.74 04/21/2024   BUN 29 (H) 04/21/2024   CO2 24 04/21/2024   TSH 0.300 (L) 04/04/2024   INR 1.0 03/16/2023   HGBA1C 5.5 04/04/2024   Assessment /  Plan:      Severe Aortic Stenosis- due for TAVR workup, however admitted to hospital for LE edema CAD- 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 not a candidate for anticoagulation in setting of GI bleeding.. S/P Watchman procedure placed in 2020 at [facility] Class 2 Obesity Type 2 DM- well controlled, A1C is 5.5 HLD Hypothyroid   Requesting consultation for possible AVR/CABG procedure.  Prior to admission she was scheduled to undergo TAVR workup with Dr. Wonda.  The patient has multiple co-morbidities.  She would likely be considered high risk candidate for surgical intervention.  The patient lives alone and would prefer to avoid open heart surgery if possible.  Dr. Daniel will evaluate patient and follow up with further recommendations and/or if patient is candidate for conventional AVR/CABG  I  spent 40 minutes  counseling the patient face to face.   Rocky Shad, PA-C 04/21/2024 11:08 AM  Patient seen and evaluated at bedside.  I had a long discussion with her, her nephew and his wife about what the options are regarding SAVR/CABG and TAVR/PCI.  While she is not a perfect surgical candidate, she does live independently, continue to work daily and is independent in her ADLs.  She admits she is not the most active, but walks unassisted and is able to do everything for herself.  We discussed the risks and benefits of both options.  Her STS risk calculated score of morbidity and mortality is quite high at 26.9%.    I emphasized the likelihood that she may need to go to rehab/SNF after surgery before returning home, and she was ok with this.  I also explained that given her multivessel coronary disease, history of DM and reduced EF, that surgery should give her better longevity and durability than PCI.  She is still volume overloaded and in Afib RVR.  She will require some optimization over the weekend and she will get her TAVR CT scans on Monday, to confirm she actually has TAVR anatomy.  Pending her scans, we will continue the discussion of surgery vs TAVR/PCI.     Con Daniel, MD Cardiothoracic Surgery Pager: [phone]

## 2024-04-21 NOTE — Progress Notes (Addendum)
 Site Area: right femoral (venous)  Site prior to removal: level 0, clean/dry/intact  Pressure applied for: 15 minutes   Bedrest beginning at: 1135  Manual: yes   Patient status during pull: stable  Post pull groin site: level 0, clean/dry/intact  Post pull instructions given: yes  Post pull pulses present: +1 DP palpable   Dressing applied: gauze and tegaderm   Comments:

## 2024-04-21 NOTE — Progress Notes (Signed)
 PROGRESS NOTE  Emily [name] FMW:[id number] DOB: 12/15/1950 DOA: 04/17/2024 PCP: Jolinda Norene HERO, DO   LOS: 3 days   Brief Narrative / Interim history: 73 year old female with history of HTN, HLD, DM2, chronic A-fib with Watchman device in place, hypothyroidism, severe AAS, DM2 comes into the hospital with left lower extremity edema.  She was found to be in A-fib with RVR and cardiology was consulted, also fluid overloaded and is being diuresed.  She was initially admitted [facility], transferred to [facility] for structural heart team evaluation for her aortic stenosis, as well as left and right heart cath  Subjective / 24h Interval events: She is doing overall well this morning, denies any chest pain, denies any palpitations.  Telemetry shows persistent A-fib with RVR with rates around 120s at rest  Assesement and Plan: Principal problem A-fib/flutter with RVR-she was initially placed on amiodarone , converted to sinus rhythm, but then A-fib/flutter recurred.  She is now on amiodarone , continue management per cardiology - She is also on metoprolol   Active problems Acute on chronic diastolic CHF-found to be fluid overloaded on admission, has received IV diuresis as per cardiology.  Fluid status much better now  Severe AS-being evaluated by cards structural team.  Left and right heart cath today  DM 2, with hyperglycemia-continue insulin   Hypothyroidism-continue Synthroid .  Last TSH roughly 3 weeks ago appears to be suppressed.  Will repeat given uncontrolled A-fib  Essential hypertension-continue medications as below  Hyperlipidemia-continue home medications  Obesity, class II-BMI 36.  She would benefit from weight loss  Scheduled Meds:  [MAR Hold] clopidogrel   75 mg Oral Daily   [MAR Hold] docusate sodium   100 mg Oral BID   [MAR Hold] ezetimibe   10 mg Oral Daily   [MAR Hold] furosemide   60 mg Intravenous BID   [MAR Hold] insulin  aspart  0-15 Units Subcutaneous  TID WC   [MAR Hold] insulin  aspart  0-5 Units Subcutaneous QHS   [MAR Hold] levothyroxine   112 mcg Oral Q48H   [MAR Hold] levothyroxine   168 mcg Oral Q48H   [MAR Hold] metoprolol  succinate  50 mg Oral Daily   [MAR Hold] polyethylene glycol  17 g Oral Daily   [MAR Hold] pravastatin   40 mg Oral q1800   [MAR Hold] rOPINIRole   0.5 mg Oral QHS   Continuous Infusions:  amiodarone  30 mg/hr (04/21/24 0037)   PRN Meds:.[MAR Hold] acetaminophen  **OR** [MAR Hold] acetaminophen , [MAR Hold] ipratropium-albuterol , [MAR Hold] LORazepam , [MAR Hold] mouth rinse, [MAR Hold] prochlorperazine   Current Outpatient Medications  Medication Instructions   amiodarone  (PACERONE ) 200 mg, Oral, Daily   clopidogrel  (PLAVIX ) 75 mg, Oral, Daily   diltiazem  (CARDIZEM ) 30 mg, Oral, 3 times daily PRN   diltiazem  (CARTIA  XT) 240 mg, Oral, Daily   diphenhydramine -acetaminophen  (TYLENOL  PM) 25-500 MG TABS tablet 1 tablet, At bedtime PRN   ezetimibe  (ZETIA ) 10 mg, Oral, Daily   fluticasone  (FLONASE ) 50 MCG/ACT nasal spray 2 sprays, Each Nare, Daily   furosemide  (LASIX ) 20 mg, Oral, Daily PRN   gabapentin  (NEURONTIN ) 300 mg, Oral, 2 times daily PRN   levonorgestrel (MIRENA) 20 MCG/DAY IUD 1 each,  Once   levothyroxine  (SYNTHROID ) 112 MCG tablet TAKE 1 TABLET BY MOUTH EVERY OTHER DAY, ALTERNATING WITH 1 & 1/2 TABLETS EVERY OTHER DAY   linaclotide  (LINZESS ) 290 mcg, Oral, Daily before breakfast   losartan  (COZAAR ) 25 mg, Oral, Daily   lovastatin  (MEVACOR ) 40 mg, Oral, Every evening   metFORMIN  (GLUCOPHAGE ) 1,000 mg, Oral, 2 times daily with meals  metoprolol  succinate (TOPROL -XL) 50 mg, Oral, Nightly, Hold if systolic BP  less than 100 or HR less than 50 bpm   polyethylene glycol (MIRALAX  / GLYCOLAX ) 17 g, Daily PRN   rOPINIRole  (REQUIP ) 0.5 mg, Oral, Daily at bedtime   Semaglutide (0.25 or 0.5MG /DOS) 0.5 mg, Subcutaneous, Weekly    Diet Orders (From admission, onward)     Start     Ordered   04/21/24 0001  Diet NPO  time specified  Diet effective midnight        04/20/24 1350            DVT prophylaxis: SCDs Start: 04/18/24 0004   Lab Results  Component Value Date   PLT 207 04/20/2024      Code Status: Full Code  Family Communication: No family at bedside  Status is: Inpatient Remains inpatient appropriate because: Severity of illness   Level of care: Telemetry  Consultants:  Cardiology  Objective: Vitals:   04/20/24 2327 04/21/24 0429 04/21/24 0500 04/21/24 0746  BP: 91/70 107/82  (!) 122/93  Pulse: (!) 119 65  (!) 135  Resp: 20 18  18   Temp: 98 F (36.7 C) 97.6 F (36.4 C)  97.7 F (36.5 C)  TempSrc: Oral Oral  Oral  SpO2: 97% 94%  97%  Weight:   91.2 kg   Height:        Intake/Output Summary (Last 24 hours) at 04/21/2024 0933 Last data filed at 04/21/2024 0504 Gross per 24 hour  Intake 704.32 ml  Output 1000 ml  Net -295.68 ml   Wt Readings from Last 3 Encounters:  04/21/24 91.2 kg  04/12/24 92.1 kg  04/07/24 93 kg    Examination:  Constitutional: NAD Eyes: no scleral icterus ENMT: Mucous membranes are moist.  Neck: normal, supple Respiratory: clear to auscultation bilaterally, no wheezing, no crackles.  Cardiovascular: Irregular, tachycardic, 3/6 SEM.  1+ pitting edema Abdomen: non distended, no tenderness. Bowel sounds positive.  Musculoskeletal: no clubbing / cyanosis.   Data Reviewed: I have independently reviewed following labs and imaging studies   CBC Recent Labs  Lab 04/17/24 1259 04/18/24 0526 04/19/24 0522 04/20/24 0510  WBC 7.4 8.4 8.9 10.9*  HGB 10.7* 10.6* 12.3 10.1*  HCT 34.0* 32.9* 38.0 31.8*  PLT 251 234 235 207  MCV 95.5 94.3 93.4 94.6  MCH 30.1 30.4 30.2 30.1  MCHC 31.5 32.2 32.4 31.8  RDW 16.1* 16.0* 15.9* 15.9*    Recent Labs  Lab 04/17/24 1259 04/18/24 0526 04/19/24 0522 04/20/24 0510 04/21/24 0405  NA 139 140 138 140 136  K 4.1 3.5 3.8 3.3* 3.7  CL 102 102 99 100 98  CO2 28 26 23 29 24   GLUCOSE 152* 159*  286* 158* 174*  BUN 33* 29* 31* 35* 29*  CREATININE 0.78 0.81 0.99 0.88 0.74  CALCIUM 9.1 8.8* 8.9 8.5* 8.4*  AST  --  15  --   --   --   ALT  --  14  --   --   --   ALKPHOS  --  96  --   --   --   BILITOT  --  0.8  --   --   --   ALBUMIN  --  4.0  --   --   --   MG  --  2.1 1.8 2.2 1.8    ------------------------------------------------------------------------------------------------------------------ No results for input(s): CHOL, HDL, LDLCALC, TRIG, CHOLHDL, LDLDIRECT in the last 72 hours.  Lab Results  Component Value Date  HGBA1C 5.5 04/04/2024   ------------------------------------------------------------------------------------------------------------------ No results for input(s): TSH, T4TOTAL, T3FREE, THYROIDAB in the last 72 hours.  Invalid input(s): FREET3  Cardiac Enzymes No results for input(s): CKMB, TROPONINI, MYOGLOBIN in the last 168 hours.  Invalid input(s): CK ------------------------------------------------------------------------------------------------------------------    Component Value Date/Time   BNP 558.5 (H) 04/03/2024 2231    CBG: Recent Labs  Lab 04/20/24 0723 04/20/24 1131 04/20/24 1634 04/20/24 2132 04/21/24 0745  GLUCAP 163* 207* 207* 205* 175*    Recent Results (from the past 240 hours)  MRSA Next Gen by PCR, Nasal     Status: None   Collection Time: 04/17/24  9:23 PM   Specimen: Nasal Mucosa; Nasal Swab  Result Value Ref Range Status   MRSA by PCR Next Gen NOT DETECTED NOT DETECTED Final    Comment: (NOTE) The GeneXpert MRSA Assay (FDA approved for NASAL specimens only), is one component of a comprehensive MRSA colonization surveillance program. It is not intended to diagnose MRSA infection nor to guide or monitor treatment for MRSA infections. Test performance is not FDA approved in patients less than 58 years old. Performed at [facility], [address] [place], [place] [address]       Radiology Studies: No results found.   Nilda Fendt, MD, PhD [facility]  Between 7 am - 7 pm I am available, please contact me via Amion (for emergencies) or Securechat (non urgent messages)  Between 7 pm - 7 am I am not available, please contact night coverage MD/APP via Amion

## 2024-04-21 NOTE — Progress Notes (Signed)
 MEDICATION RELATED CONSULT NOTE - INITIAL   Pharmacy Consult for Digoxin  Indication: afib  Allergies  Allergen Reactions   Statins Other (See Comments) and Cough    Not all statins but some cause cough and pain in legs.   Jardiance  [Empagliflozin ] Other (See Comments)    Recurrent vaginitis   Quinapril Hcl Cough   Tape Other (See Comments)    Redness, please use paper tape    Patient Measurements: Height: 5' 2 (157.5 cm) Weight: 91.2 kg (201 lb) IBW/kg (Calculated) : 50.1  Vital Signs: Temp: 97.6 F (36.4 C) (11/21 1212) Temp Source: Oral (11/21 1212) BP: 112/59 (11/21 1212) Pulse Rate: 121 (11/21 1212) Intake/Output from previous day: 11/20 0701 - 11/21 0700 In: 1047.3 [P.O.:840; I.V.:207.3] Out: 1500 [Urine:1500] Intake/Output from this shift: Total I/O In: 3 [I.V.:3] Out: -   Labs: Recent Labs    04/19/24 0522 04/20/24 0510 04/21/24 0405 04/21/24 1021 04/21/24 1032  WBC 8.9 10.9*  --   --   --   HGB 12.3 10.1*  --  10.2* 11.6*  11.6*  HCT 38.0 31.8*  --  30.0* 34.0*  34.0*  PLT 235 207  --   --   --   CREATININE 0.99 0.88 0.74  --   --   MG 1.8 2.2 1.8  --   --    Estimated Creatinine Clearance: 64.8 mL/min (by C-G formula based on SCr of 0.74 mg/dL).   Microbiology: Recent Results (from the past 720 hours)  MRSA Next Gen by PCR, Nasal     Status: None   Collection Time: 04/17/24  9:23 PM   Specimen: Nasal Mucosa; Nasal Swab  Result Value Ref Range Status   MRSA by PCR Next Gen NOT DETECTED NOT DETECTED Final    Comment: (NOTE) The GeneXpert MRSA Assay (FDA approved for NASAL specimens only), is one component of a comprehensive MRSA colonization surveillance program. It is not intended to diagnose MRSA infection nor to guide or monitor treatment for MRSA infections. Test performance is not FDA approved in patients less than 14 years old. Performed at [facility], [address] [place], [place] [address]     Medical History: Past  Medical History:  Diagnosis Date   Anemia    Arthritis    Atrial fibrillation, rapid (HCC) 10/27/2013   Bilateral carotid artery disease    Complication of anesthesia    difficult intubation in past   Diabetes mellitus without complication (HCC)    type 2   Difficult intubation    states 'lady that did the sleep study told me I have the smallest airway she has ever seen in an adult   Dyslipidemia 06/29/2017   Heart murmur    History of blood transfusion 09/2019   GI bleed - in [facility]   Hypertension    Hypothyroid    Obesity    Obstructive sleep apnea    occasional uses CPAP   Peripheral vascular disease    Persistent atrial fibrillation (HCC) 01/16/2020   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 30 tablet 0 04/17/2024 Morning   clopidogrel  (PLAVIX ) 75 MG tablet TAKE 1 TABLET BY MOUTH EVERY DAY 90 tablet 2 04/17/2024 at  7:30 AM   diltiazem  (CARDIZEM ) 30 MG tablet Take 1 tablet (30 mg total) by mouth 3 (three) times daily as needed (afib). 30 tablet 0 Past Week   diltiazem  (CARTIA  XT) 240 MG 24 hr capsule Take 1 capsule (240 mg total) by mouth daily. 30 capsule 0 04/17/2024 Morning   diphenhydramine -acetaminophen  (TYLENOL  PM) 25-500 MG TABS tablet Take 1 tablet by mouth at bedtime as needed (pain).   Past Month   ezetimibe  (ZETIA ) 10 MG tablet TAKE 1 TABLET BY MOUTH EVERY DAY 90 tablet 2 04/17/2024 Morning   fluticasone  (FLONASE ) 50 MCG/ACT nasal spray Place 2 sprays into both  nostrils daily. 16 g 6 Past Week   furosemide  (LASIX ) 20 MG tablet Take 1 tablet (20 mg total) by mouth daily as needed for fluid. 90 tablet 3 04/17/2024 Morning   gabapentin  (NEURONTIN ) 300 MG capsule TAKE 1 CAPSULE (300 MG TOTAL) BY MOUTH 2 (TWO) TIMES DAILY AS NEEDED (NEUROPATHY RIGHT LEG). (Patient taking differently: Take 300 mg by mouth 2 (two) times daily.) 180 capsule 0 04/17/2024 Morning   levonorgestrel (MIRENA) 20 MCG/DAY IUD 1 each by Intrauterine route once.   Unknown   levothyroxine  (SYNTHROID ) 112 MCG tablet TAKE 1 TABLET BY MOUTH EVERY OTHER DAY, ALTERNATING WITH 1 & 1/2 TABLETS EVERY OTHER DAY (Patient taking differently: Take 112 mcg by mouth See admin instructions. TAKE 1 TABLET BY MOUTH EVERY OTHER DAY, ALTERNATING WITH 1 & 1/2 TABLETS EVERY OTHER DAY) 112 tablet 3 04/17/2024 Morning   linaclotide  (LINZESS ) 290 MCG CAPS capsule Take 1 capsule (290 mcg total) by mouth daily before breakfast.   Past Week   metFORMIN  (GLUCOPHAGE ) 1000 MG tablet Take 1 tablet (1,000 mg total) by mouth 2 (two) times daily with a meal. (Patient taking differently: Take 500 mg by mouth 2 (two) times daily with a meal.) 180 tablet 3 04/17/2024 Morning   metoprolol  succinate (TOPROL -XL) 50 MG 24 hr tablet Take 1 tablet (50 mg total) by mouth at bedtime. Hold if systolic BP  less than 100 or HR less than 50 bpm 90 tablet 3 04/16/2024   polyethylene glycol (MIRALAX  / GLYCOLAX ) 17 g packet Take 17 g by mouth daily as needed for moderate constipation.   Past Week   rOPINIRole  (REQUIP ) 0.5 MG tablet TAKE 1 TABLET BY MOUTH AT BEDTIME. 90 tablet 0 04/16/2024   Semaglutide ,0.25 or 0.5MG /DOS, 2 MG/3ML SOPN Inject 0.5 mg into the skin once a week. (Patient taking differently: Inject 0.5 mg into the skin every Monday.) 3 mL  04/17/2024 Morning   losartan  (COZAAR ) 25 MG tablet Take 1 tablet (25 mg total) by mouth daily. 90 tablet 2 04/17/2024 Morning   lovastatin  (MEVACOR ) 40 MG tablet Take 1 tablet (40 mg total) by mouth  every evening. 90 tablet 3 04/16/2024    Assessment: 73 y.o. pt with h/o PAF. Here with afib with RVR - on IV amio. Dr. Ladona consulted pharmacy to help with digoxin  load. Drug interaction with amiodarone  which will decrease dig clearance. Will reduce load and maintenance dose by ~50% due to this interaction.  Goal of Therapy:  Dig level 0.5-1  Plan:  Digoxin  0.25mg  IV q6h x 2 Digoxin  0.125mg  daily Will check dig level at [facility] in ~5 days.  Vito Ralph, PharmD, BCPS Please see amion for  complete clinical pharmacist phone list 04/21/2024,5:49 PM

## 2024-04-21 NOTE — Plan of Care (Signed)
  Problem: Education: Goal: Knowledge of General Education information will improve Description: Including pain rating scale, medication(s)/side effects and non-pharmacologic comfort measures Outcome: Progressing   Problem: Health Behavior/Discharge Planning: Goal: Ability to manage health-related needs will improve Outcome: Progressing   Problem: Clinical Measurements: Goal: Ability to maintain clinical measurements within normal limits will improve Outcome: Progressing Goal: Will remain free from infection Outcome: Progressing Goal: Diagnostic test results will improve Outcome: Progressing Goal: Respiratory complications will improve Outcome: Progressing Goal: Cardiovascular complication will be avoided Outcome: Progressing   Problem: Activity: Goal: Risk for activity intolerance will decrease Outcome: Progressing   Problem: Nutrition: Goal: Adequate nutrition will be maintained Outcome: Progressing   Problem: Coping: Goal: Level of anxiety will decrease Outcome: Progressing   Problem: Elimination: Goal: Will not experience complications related to bowel motility Outcome: Progressing Goal: Will not experience complications related to urinary retention Outcome: Progressing   Problem: Pain Managment: Goal: General experience of comfort will improve and/or be controlled Outcome: Progressing   Problem: Safety: Goal: Ability to remain free from injury will improve Outcome: Progressing   Problem: Skin Integrity: Goal: Risk for impaired skin integrity will decrease Outcome: Progressing   Problem: Education: Goal: Knowledge of disease or condition will improve Outcome: Progressing Goal: Understanding of medication regimen will improve Outcome: Progressing Goal: Individualized Educational Video(s) Outcome: Progressing   Problem: Activity: Goal: Ability to tolerate increased activity will improve Outcome: Progressing   Problem: Cardiac: Goal: Ability to achieve  and maintain adequate cardiopulmonary perfusion will improve Outcome: Progressing   Problem: Health Behavior/Discharge Planning: Goal: Ability to safely manage health-related needs after discharge will improve Outcome: Progressing   Problem: Education: Goal: Ability to describe self-care measures that may prevent or decrease complications (Diabetes Survival Skills Education) will improve Outcome: Progressing Goal: Individualized Educational Video(s) Outcome: Progressing   Problem: Coping: Goal: Ability to adjust to condition or change in health will improve Outcome: Progressing   Problem: Fluid Volume: Goal: Ability to maintain a balanced intake and output will improve Outcome: Progressing   Problem: Health Behavior/Discharge Planning: Goal: Ability to identify and utilize available resources and services will improve Outcome: Progressing Goal: Ability to manage health-related needs will improve Outcome: Progressing   Problem: Metabolic: Goal: Ability to maintain appropriate glucose levels will improve Outcome: Progressing   Problem: Nutritional: Goal: Maintenance of adequate nutrition will improve Outcome: Progressing Goal: Progress toward achieving an optimal weight will improve Outcome: Progressing   Problem: Skin Integrity: Goal: Risk for impaired skin integrity will decrease Outcome: Progressing   Problem: Tissue Perfusion: Goal: Adequacy of tissue perfusion will improve Outcome: Progressing   Problem: Education: Goal: Understanding of CV disease, CV risk reduction, and recovery process will improve Outcome: Progressing Goal: Individualized Educational Video(s) Outcome: Progressing   Problem: Activity: Goal: Ability to return to baseline activity level will improve Outcome: Progressing   Problem: Cardiovascular: Goal: Ability to achieve and maintain adequate cardiovascular perfusion will improve Outcome: Progressing Goal: Vascular access site(s) Level  0-1 will be maintained Outcome: Progressing   Problem: Health Behavior/Discharge Planning: Goal: Ability to safely manage health-related needs after discharge will improve Outcome: Progressing

## 2024-04-22 ENCOUNTER — Encounter (HOSPITAL_COMMUNITY): Payer: Self-pay | Admitting: Cardiovascular Disease

## 2024-04-22 DIAGNOSIS — I480 Paroxysmal atrial fibrillation: Secondary | ICD-10-CM | POA: Diagnosis not present

## 2024-04-22 LAB — COMPREHENSIVE METABOLIC PANEL WITH GFR
ALT: 15 U/L (ref 0–44)
AST: 15 U/L (ref 15–41)
Albumin: 3.4 g/dL — ABNORMAL LOW (ref 3.5–5.0)
Alkaline Phosphatase: 79 U/L (ref 38–126)
Anion gap: 15 (ref 5–15)
BUN: 30 mg/dL — ABNORMAL HIGH (ref 8–23)
CO2: 23 mmol/L (ref 22–32)
Calcium: 8.4 mg/dL — ABNORMAL LOW (ref 8.9–10.3)
Chloride: 99 mmol/L (ref 98–111)
Creatinine, Ser: 0.96 mg/dL (ref 0.44–1.00)
GFR, Estimated: >60 mL/min (ref >60)
Glucose, Bld: 151 mg/dL — ABNORMAL HIGH (ref 70–99)
Potassium: 4.2 mmol/L (ref 3.5–5.1)
Sodium: 137 mmol/L (ref 135–145)
Total Bilirubin: 1.5 mg/dL — ABNORMAL HIGH (ref 0.0–1.2)
Total Protein: 6.3 g/dL — ABNORMAL LOW (ref 6.5–8.1)

## 2024-04-22 LAB — CBC
HCT: 35.8 % — ABNORMAL LOW (ref 36.0–46.0)
Hemoglobin: 11.6 g/dL — ABNORMAL LOW (ref 12.0–15.0)
MCH: 29.9 pg (ref 26.0–34.0)
MCHC: 32.4 g/dL (ref 30.0–36.0)
MCV: 92.3 fL (ref 80.0–100.0)
Platelets: 258 K/uL (ref 150–400)
RBC: 3.88 MIL/uL (ref 3.87–5.11)
RDW: 16 % — ABNORMAL HIGH (ref 11.5–15.5)
WBC: 10.8 K/uL — ABNORMAL HIGH (ref 4.0–10.5)
nRBC: 0 % (ref 0.0–0.2)

## 2024-04-22 LAB — GLUCOSE, CAPILLARY
Glucose-Capillary: 147 mg/dL — ABNORMAL HIGH (ref 70–99)
Glucose-Capillary: 260 mg/dL — ABNORMAL HIGH (ref 70–99)
Glucose-Capillary: 153 mg/dL — ABNORMAL HIGH (ref 70–99)
Glucose-Capillary: 183 mg/dL — ABNORMAL HIGH (ref 70–99)

## 2024-04-22 LAB — MAGNESIUM: Magnesium: 2.2 mg/dL (ref 1.7–2.4)

## 2024-04-22 LAB — PHOSPHORUS: Phosphorus: 3.4 mg/dL (ref 2.5–4.6)

## 2024-04-22 MED ORDER — SPIRONOLACTONE 25 MG PO TABS
25.0000 mg | ORAL_TABLET | Freq: Every day | ORAL | Status: DC
  Administered 2024-04-23 – 2024-04-25 (×3): 25 mg via ORAL
  Filled 2024-04-22 (×3): qty 1

## 2024-04-22 MED ORDER — AMIODARONE HCL 200 MG PO TABS
400.0000 mg | ORAL_TABLET | Freq: Two times a day (BID) | ORAL | Status: DC
  Administered 2024-04-22 – 2024-04-25 (×6): 400 mg via ORAL
  Filled 2024-04-22 (×7): qty 2

## 2024-04-22 NOTE — Progress Notes (Signed)
 PROGRESS NOTE  Emily [name] FMW:[id number] DOB: [birth year] DOA: 04/17/2024 PCP: Jolinda Norene HERO, DO   LOS: 4 days   Brief Narrative / Interim history: 73 year old female with history of HTN, HLD, DM2, chronic A-fib with Watchman device in place, hypothyroidism, severe AAS, DM2 comes into the hospital with left lower extremity edema.  She was found to be in A-fib with RVR and cardiology was consulted, also fluid overloaded and is being diuresed.  She was initially admitted [facility], transferred to [facility] for structural heart team evaluation for her aortic stenosis, as well as left and right heart cath  Subjective / 24h Interval events: She denies any chest pain or palpitations this morning.  She denies any shortness of breath.  Telemetry shows she is in A-fib with RVR  Assesement and Plan: Principal problem A-fib/flutter with RVR-she was initially placed on amiodarone , converted to sinus rhythm, but then A-fib/flutter recurred.  Cardiology following, appreciate input.  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  Cardiothoracic surgery engaged to evaluate candidacy for CABG/AVR - Pending CT chest/TAVR protocol on Monday per cardiology  Acute on chronic diastolic CHF-found to be fluid overloaded on admission, has received IV diuresis as per cardiology.  Continue furosemide   Severe AS-being evaluated by cards structural team.  Left and right heart cath as above, pending CT chest/TAVR protocol on Monday  DM 2, with hyperglycemia-continue insulin   CBG (last 3)  Recent Labs    04/21/24 1727 04/21/24 2114 04/22/24 0817  GLUCAP 201* 136* 147*   Hypothyroidism-continue Synthroid .  TSH unremarkable  Essential hypertension-continue medications as below, blood pressure stable  Hyperlipidemia-continue home medications  Obesity, class II-BMI 36.  She  would benefit from weight loss  Scheduled Meds:  digoxin   0.125 mg Oral Daily   diltiazem   30 mg Oral Q6H   docusate sodium   100 mg Oral BID   ezetimibe   10 mg Oral Daily   furosemide   60 mg Intravenous BID   insulin  aspart  0-15 Units Subcutaneous TID WC   insulin  aspart  0-5 Units Subcutaneous QHS   levothyroxine   112 mcg Oral Q48H   levothyroxine   168 mcg Oral Q48H   metoprolol  succinate  50 mg Oral Daily   polyethylene glycol  17 g Oral Daily   pravastatin   40 mg Oral q1800   rOPINIRole   0.5 mg Oral QHS   sodium chloride  flush  3 mL Intravenous Q12H   Continuous Infusions:  sodium chloride      amiodarone  30 mg/hr (04/22/24 0046)   PRN Meds:.sodium chloride , acetaminophen  **OR** acetaminophen , bisacodyl , ipratropium-albuterol , LORazepam , mouth rinse, prochlorperazine , sodium chloride  flush  Current Outpatient Medications  Medication Instructions   amiodarone  (PACERONE ) 200 mg, Oral, Daily   clopidogrel  (PLAVIX ) 75 mg, Oral, Daily   diltiazem  (CARDIZEM ) 30 mg, Oral, 3 times daily PRN   diltiazem  (CARTIA  XT) 240 mg, Oral, Daily   diphenhydramine -acetaminophen  (TYLENOL  PM) 25-500 MG TABS tablet 1 tablet, At bedtime PRN   ezetimibe  (ZETIA ) 10 mg, Oral, Daily   fluticasone  (FLONASE ) 50 MCG/ACT nasal spray 2 sprays, Each Nare, Daily   furosemide  (LASIX ) 20 mg, Oral, Daily PRN   gabapentin  (NEURONTIN ) 300 mg, Oral, 2 times daily PRN   levonorgestrel (MIRENA) 20 MCG/DAY IUD 1 each,  Once   levothyroxine  (SYNTHROID ) 112 MCG tablet TAKE 1 TABLET BY MOUTH EVERY OTHER DAY, ALTERNATING WITH 1 & 1/2 TABLETS EVERY OTHER DAY  linaclotide  (LINZESS ) 290 mcg, Oral, Daily before breakfast   losartan  (COZAAR ) 25 mg, Oral, Daily   lovastatin  (MEVACOR ) 40 mg, Oral, Every evening   metFORMIN  (GLUCOPHAGE ) 1,000 mg, Oral, 2 times daily with meals   metoprolol  succinate (TOPROL -XL) 50 mg, Oral, Nightly, Hold if systolic BP  less than 100 or HR less than 50 bpm   polyethylene glycol (MIRALAX  /  GLYCOLAX ) 17 g, Daily PRN   rOPINIRole  (REQUIP ) 0.5 mg, Oral, Daily at bedtime   Semaglutide (0.25 or 0.5MG /DOS) 0.5 mg, Subcutaneous, Weekly    Diet Orders (From admission, onward)     Start     Ordered   04/21/24 1212  Diet Heart Room service appropriate? Yes; Fluid consistency: Thin  Diet effective now       Question Answer Comment  Room service appropriate? Yes   Fluid consistency: Thin      04/21/24 1211            DVT prophylaxis: SCDs Start: 04/18/24 0004   Lab Results  Component Value Date   PLT 258 04/22/2024      Code Status: Full Code  Family Communication: No family at bedside  Status is: Inpatient Remains inpatient appropriate because: Severity of illness   Level of care: Telemetry  Consultants:  Cardiology  Objective: Vitals:   04/22/24 0047 04/22/24 0354 04/22/24 0450 04/22/24 0732  BP: 95/68  111/82 117/82  Pulse: (!) 123 (!) 126 (!) 112 (!) 123  Resp: 18  (!) 22 20  Temp: 98.2 F (36.8 C)  98.3 F (36.8 C) 97.8 F (36.6 C)  TempSrc: Oral  Oral Oral  SpO2: 98% 98% 98% 98%  Weight:  91.1 kg    Height:        Intake/Output Summary (Last 24 hours) at 04/22/2024 1021 Last data filed at 04/22/2024 0500 Gross per 24 hour  Intake 323 ml  Output 1000 ml  Net -677 ml   Wt Readings from Last 3 Encounters:  04/22/24 91.1 kg  04/12/24 92.1 kg  04/07/24 93 kg    Examination:  Constitutional: NAD Eyes: lids and conjunctivae normal, no scleral icterus ENMT: mmm Neck: normal, supple Respiratory: clear to auscultation bilaterally, no wheezing, no crackles.  Cardiovascular: Regular, 3/6 SEM, 1+ edema Abdomen: soft, no distention, no tenderness. Bowel sounds positive.   Data Reviewed: I have independently reviewed following labs and imaging studies   CBC Recent Labs  Lab 04/17/24 1259 04/18/24 0526 04/19/24 0522 04/20/24 0510 04/21/24 1021 04/21/24 1032 04/22/24 0149  WBC 7.4 8.4 8.9 10.9*  --   --  10.8*  HGB 10.7* 10.6* 12.3  10.1* 10.2* 11.6*  11.6* 11.6*  HCT 34.0* 32.9* 38.0 31.8* 30.0* 34.0*  34.0* 35.8*  PLT 251 234 235 207  --   --  258  MCV 95.5 94.3 93.4 94.6  --   --  92.3  MCH 30.1 30.4 30.2 30.1  --   --  29.9  MCHC 31.5 32.2 32.4 31.8  --   --  32.4  RDW 16.1* 16.0* 15.9* 15.9*  --   --  16.0*    Recent Labs  Lab 04/18/24 0526 04/19/24 0522 04/20/24 0510 04/21/24 0405 04/21/24 1021 04/21/24 1032 04/21/24 1216 04/22/24 0149  NA 140 138 140 136 145 137  132*  --  137  K 3.5 3.8 3.3* 3.7 3.0* 3.6  3.6  --  4.2  CL 102 99 100 98  --   --   --  99  CO2 26 23  29 24  --   --   --  23  GLUCOSE 159* 286* 158* 174*  --   --   --  151*  BUN 29* 31* 35* 29*  --   --   --  30*  CREATININE 0.81 0.99 0.88 0.74  --   --   --  0.96  CALCIUM 8.8* 8.9 8.5* 8.4*  --   --   --  8.4*  AST 15  --   --   --   --   --   --  15  ALT 14  --   --   --   --   --   --  15  ALKPHOS 96  --   --   --   --   --   --  79  BILITOT 0.8  --   --   --   --   --   --  1.5*  ALBUMIN 4.0  --   --   --   --   --   --  3.4*  MG 2.1 1.8 2.2 1.8  --   --   --  2.2  TSH  --   --   --   --   --   --  1.179  --     ------------------------------------------------------------------------------------------------------------------ No results for input(s): CHOL, HDL, LDLCALC, TRIG, CHOLHDL, LDLDIRECT in the last 72 hours.  Lab Results  Component Value Date   HGBA1C 5.5 04/04/2024   ------------------------------------------------------------------------------------------------------------------ Recent Labs    04/21/24 1216  TSH 1.179    Cardiac Enzymes No results for input(s): CKMB, TROPONINI, MYOGLOBIN in the last 168 hours.  Invalid input(s): CK ------------------------------------------------------------------------------------------------------------------    Component Value Date/Time   BNP 558.5 (H) 04/03/2024 2231    CBG: Recent Labs  Lab 04/21/24 0745 04/21/24 1215 04/21/24 1727  04/21/24 2114 04/22/24 0817  GLUCAP 175* 166* 201* 136* 147*    Recent Results (from the past 240 hours)  MRSA Next Gen by PCR, Nasal     Status: None   Collection Time: 04/17/24  9:23 PM   Specimen: Nasal Mucosa; Nasal Swab  Result Value Ref Range Status   MRSA by PCR Next Gen NOT DETECTED NOT DETECTED Final    Comment: (NOTE) The GeneXpert MRSA Assay (FDA approved for NASAL specimens only), is one component of a comprehensive MRSA colonization surveillance program. It is not intended to diagnose MRSA infection nor to guide or monitor treatment for MRSA infections. Test performance is not FDA approved in patients less than 72 years old. Performed at [facility], [address], [place], [place] [address]      Radiology Studies: No results found.   Nilda Fendt, MD, PhD [facility]  Between 7 am - 7 pm I am available, please contact me via Amion (for emergencies) or Securechat (non urgent messages)  Between 7 pm - 7 am I am not available, please contact night coverage MD/APP via Amion

## 2024-04-22 NOTE — Plan of Care (Signed)
  Problem: Education: Goal: Knowledge of General Education information will improve Description: Including pain rating scale, medication(s)/side effects and non-pharmacologic comfort measures Outcome: Progressing   Problem: Health Behavior/Discharge Planning: Goal: Ability to manage health-related needs will improve Outcome: Progressing   Problem: Clinical Measurements: Goal: Ability to maintain clinical measurements within normal limits will improve Outcome: Progressing Goal: Will remain free from infection Outcome: Progressing Goal: Diagnostic test results will improve Outcome: Progressing Goal: Respiratory complications will improve Outcome: Progressing Goal: Cardiovascular complication will be avoided Outcome: Progressing   Problem: Activity: Goal: Risk for activity intolerance will decrease Outcome: Progressing   Problem: Nutrition: Goal: Adequate nutrition will be maintained Outcome: Progressing   Problem: Coping: Goal: Level of anxiety will decrease Outcome: Progressing   Problem: Elimination: Goal: Will not experience complications related to bowel motility Outcome: Progressing Goal: Will not experience complications related to urinary retention Outcome: Progressing   Problem: Pain Managment: Goal: General experience of comfort will improve and/or be controlled Outcome: Progressing   Problem: Safety: Goal: Ability to remain free from injury will improve Outcome: Progressing   Problem: Skin Integrity: Goal: Risk for impaired skin integrity will decrease Outcome: Progressing   Problem: Education: Goal: Knowledge of disease or condition will improve Outcome: Progressing Goal: Understanding of medication regimen will improve Outcome: Progressing Goal: Individualized Educational Video(s) Outcome: Progressing   Problem: Activity: Goal: Ability to tolerate increased activity will improve Outcome: Progressing   Problem: Cardiac: Goal: Ability to achieve  and maintain adequate cardiopulmonary perfusion will improve Outcome: Progressing   Problem: Health Behavior/Discharge Planning: Goal: Ability to safely manage health-related needs after discharge will improve Outcome: Progressing   Problem: Education: Goal: Ability to describe self-care measures that may prevent or decrease complications (Diabetes Survival Skills Education) will improve Outcome: Progressing Goal: Individualized Educational Video(s) Outcome: Progressing   Problem: Coping: Goal: Ability to adjust to condition or change in health will improve Outcome: Progressing   Problem: Fluid Volume: Goal: Ability to maintain a balanced intake and output will improve Outcome: Progressing   Problem: Health Behavior/Discharge Planning: Goal: Ability to identify and utilize available resources and services will improve Outcome: Progressing Goal: Ability to manage health-related needs will improve Outcome: Progressing   Problem: Metabolic: Goal: Ability to maintain appropriate glucose levels will improve Outcome: Progressing   Problem: Nutritional: Goal: Maintenance of adequate nutrition will improve Outcome: Progressing Goal: Progress toward achieving an optimal weight will improve Outcome: Progressing   Problem: Skin Integrity: Goal: Risk for impaired skin integrity will decrease Outcome: Progressing   Problem: Tissue Perfusion: Goal: Adequacy of tissue perfusion will improve Outcome: Progressing   Problem: Education: Goal: Understanding of CV disease, CV risk reduction, and recovery process will improve Outcome: Progressing Goal: Individualized Educational Video(s) Outcome: Progressing   Problem: Activity: Goal: Ability to return to baseline activity level will improve Outcome: Progressing   Problem: Cardiovascular: Goal: Ability to achieve and maintain adequate cardiovascular perfusion will improve Outcome: Progressing Goal: Vascular access site(s) Level  0-1 will be maintained Outcome: Progressing   Problem: Health Behavior/Discharge Planning: Goal: Ability to safely manage health-related needs after discharge will improve Outcome: Progressing

## 2024-04-22 NOTE — Progress Notes (Signed)
 Cardiology Consultation:   Patient ID: Emily [name] MRN: [id number]; DOB: 1950/10/27  Admit date: 04/17/2024 Date of Consult: 04/22/2024  Primary Care Provider: Jolinda Norene HERO, DO [facility] Cardiologist: Lynwood Schilling, MD  [facility] Electrophysiologist:  Will Gladis Norton, MD   Patient Profile:   Emily [name] is a 73 y.o. female with persistent AF/RVR, prior GIB s/p Watchman implant, severe AAS, multivessel CAD, HTN, HLD, DM2, and hypothyroidism who is being followed for management of persistent AF/AFL with RVR along with multivessel CAD and severe AS.  History of Present Illness:   Overall doing okay today.  AF/RVR with conversion to sinus rhythm at 10:07 this morning.  She has been on amiodarone  since 04/04/2024.  We reviewed her history of GI bleed that required extensive evaluation in 2021.  She subsequently had a watchman implanted at [facility].  She has previously been on flecainide  with breakthrough AF/RVR.  Past Medical History:  Diagnosis Date   Anemia    Arthritis    Atrial fibrillation, rapid (HCC) 10/27/2013   Bilateral carotid artery disease    Complication of anesthesia    difficult intubation in past   Diabetes mellitus without complication (HCC)    type 2   Difficult intubation    states 'lady that did the sleep study told me I have the smallest airway she has ever seen in an adult   Dyslipidemia 06/29/2017   Heart murmur    History of blood transfusion 09/2019   GI bleed - in [place]   Hypertension    Hypothyroid    Obesity    Obstructive sleep apnea    occasional uses CPAP   Peripheral vascular disease    Persistent atrial fibrillation (HCC) 01/16/2020   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 - xrays were negative   Upper GI bleed 09/25/2019   Past Surgical History:  Procedure Laterality Date   CATARACT EXTRACTION Left    CATARACT EXTRACTION W/PHACO Right 12/09/2015   Procedure: CATARACT EXTRACTION PHACO  AND INTRAOCULAR LENS PLACEMENT (IOC);  Surgeon: Cherene Mania, MD;  Location: [facility];  Service: Ophthalmology;  Laterality: Right;  CDE:10.48   COLONOSCOPY W/ POLYPECTOMY     cyst on back of neck removed     DILATION AND CURETTAGE OF UTERUS     x 5   ENDARTERECTOMY Left 08/30/2015   Procedure: LEFT CAROYID ENDARTERECTOMY WITH XENOSURE BOVINE PERICARDIUM PATCH ANGIOPLASTY;  Surgeon: Gaile LELON New, MD;  Location: [facility] OR;  Service: Vascular;  Laterality: Left;   RIGHT/LEFT HEART CATH AND CORONARY ANGIOGRAPHY N/A 04/21/2024   Procedure: RIGHT/LEFT HEART CATH AND CORONARY ANGIOGRAPHY;  Surgeon: Verlin Lonni BIRCH, MD;  Location: [facility] INV[facility];  Service: Cardiovascular;  Laterality: N/A;   TONSILLECTOMY     TRANSCAROTID ARTERY REVASCULARIZATION  Right 03/19/2023   Procedure: Right Transcarotid Artery Revascularization;  Surgeon: New Gaile LELON, MD;  Location: [facility] OR;  Service: Vascular;  Laterality: Right;   ULTRASOUND GUIDANCE FOR VASCULAR ACCESS Left 03/19/2023   Procedure: ULTRASOUND GUIDANCE FOR VASCULAR ACCESS;  Surgeon: New Gaile LELON, MD;  Location: [facility] OR;  Service: Vascular;  Laterality: Left;   UPPER GI ENDOSCOPY     x several    Inpatient Medications: Scheduled Meds:  amiodarone   400 mg Oral BID   digoxin   0.125 mg Oral Daily   diltiazem   30 mg Oral Q6H   docusate sodium   100 mg Oral BID   ezetimibe   10 mg Oral Daily   furosemide   60  mg Intravenous BID   insulin  aspart  0-15 Units Subcutaneous TID WC   insulin  aspart  0-5 Units Subcutaneous QHS   levothyroxine   112 mcg Oral Q48H   levothyroxine   168 mcg Oral Q48H   metoprolol  succinate  50 mg Oral Daily   polyethylene glycol  17 g Oral Daily   pravastatin   40 mg Oral q1800   rOPINIRole   0.5 mg Oral QHS   sodium chloride  flush  3 mL Intravenous Q12H   Continuous Infusions:  PRN Meds: acetaminophen  **OR** acetaminophen , bisacodyl , ipratropium-albuterol , LORazepam , mouth rinse, prochlorperazine , sodium chloride   flush  Allergies:    Allergies  Allergen Reactions   Statins Other (See Comments) and Cough    Not all statins but some cause cough and pain in legs.   Jardiance  [Empagliflozin ] Other (See Comments)    Recurrent vaginitis   Quinapril Hcl Cough   Tape Other (See Comments)    Redness, please use paper tape   Social History:   Social History   Socioeconomic History   Marital status: Single    Spouse name: Not on file   Number of children: Not on file   Years of education: Not on file   Highest education level: Not on file  Occupational History   Not on file  Tobacco Use   Smoking status: Never    Passive exposure: Never   Smokeless tobacco: Never  Vaping Use   Vaping status: Never Used  Substance and Sexual Activity   Alcohol use: No    Alcohol/week: 0.0 standard drinks of alcohol   Drug use: Never   Sexual activity: Not Currently    Birth control/protection: Post-menopausal  Other Topics Concern   Not on file  Social History Narrative   Not on file   Social Drivers of Health   Financial Resource Strain: Low Risk  (04/21/2022)   Overall Financial Resource Strain (CARDIA)    Difficulty of Paying Living Expenses: Not hard at all  Food Insecurity: No Food Insecurity (04/20/2024)   Hunger Vital Sign    Worried About Running Out of Food in the Last Year: Never true    Ran Out of Food in the Last Year: Never true  Transportation Needs: No Transportation Needs (04/20/2024)   PRAPARE - Administrator, Civil Service (Medical): No    Lack of Transportation (Non-Medical): No  Physical Activity: Inactive (04/21/2022)   Exercise Vital Sign    Days of Exercise per Week: 0 days    Minutes of Exercise per Session: 0 min  Stress: No Stress Concern Present (04/21/2022)   [employer] of Occupational Health - Occupational Stress Questionnaire    Feeling of Stress : Not at all  Social Connections: Moderately Integrated (04/20/2024)   Social Connection and  Isolation Panel    Frequency of Communication with Friends and Family: More than three times a week    Frequency of Social Gatherings with Friends and Family: More than three times a week    Attends Religious Services: More than 4 times per year    Active Member of [employer] or Organizations: Yes    Attends Banker Meetings: More than 4 times per year    Marital Status: Never married  Intimate Partner Violence: Not At Risk (04/20/2024)   Humiliation, Afraid, Rape, and Kick questionnaire    Fear of Current or Ex-Partner: No    Emotionally Abused: No    Physically Abused: No    Sexually Abused: No    Family  History:    Family History  Problem Relation Age of Onset   COPD Father    Heart failure Father    Heart disease Father    Emphysema Father    Arrhythmia Sister    Arrhythmia Sister    Arrhythmia Sister        had PPM also   Cancer Sister     ROS:  Review of Systems: [y] = yes, [ ]  = no      General: Weight gain [ ] ; Weight loss [ ] ; Anorexia [ ] ; Fatigue [ ] ; Fever [ ] ; Chills [ ] ; Weakness [ ]    Cardiac: Chest pain/pressure [ ] ; Resting SOB [ ] ; Exertional SOB [ ] ; Orthopnea [ ] ; Pedal Edema [ ] ; Palpitations [ ] ; Syncope [ ] ; Presyncope [ ] ; Paroxysmal nocturnal dyspnea [ ]    Pulmonary: Cough [ ] ; Wheezing [ ] ; Hemoptysis [ ] ; Sputum [ ] ; Snoring [ ]    GI: Vomiting [ ] ; Dysphagia [ ] ; Melena [ ] ; Hematochezia [ ] ; Heartburn [ ] ; Abdominal pain [ ] ; Constipation [ ] ; Diarrhea [ ] ; BRBPR [ ]    GU: Hematuria [ ] ; Dysuria [ ] ; Nocturia [ ]  Vascular: Pain in legs with walking [ ] ; Pain in feet with lying flat [ ] ; Non-healing sores [ ] ; Stroke [ ] ; TIA [ ] ; Slurred speech [ ] ;   Neuro: Headaches [ ] ; Vertigo [ ] ; Seizures [ ] ; Paresthesias [ ] ;Blurred vision [ ] ; Diplopia [ ] ; Vision changes [ ]    Ortho/Skin: Arthritis [ ] ; Joint pain [ ] ; Muscle pain [ ] ; Joint swelling [ ] ; Back Pain [ ] ; Rash [ ]    Psych: Depression [ ] ; Anxiety [ ]    Heme: Bleeding problems [ ] ;  Clotting disorders [ ] ; Anemia [ ]    Endocrine: Diabetes [ ] ; Thyroid  dysfunction [ ]    Physical Exam/Data:   Vitals:   04/22/24 0450 04/22/24 0732 04/22/24 1143 04/22/24 1645  BP: 111/82 117/82 (!) 120/52 (!) 154/59  Pulse: (!) 112 (!) 123 (!) 57 60  Resp: (!) 22 20 18 18   Temp: 98.3 F (36.8 C) 97.8 F (36.6 C) 97.6 F (36.4 C) 97.8 F (36.6 C)  TempSrc: Oral Oral Oral Oral  SpO2: 98% 98% 100% 100%  Weight:      Height:        Intake/Output Summary (Last 24 hours) at 04/22/2024 1843 Last data filed at 04/22/2024 1000 Gross per 24 hour  Intake 920 ml  Output 1000 ml  Net -80 ml      04/22/2024    3:54 AM 04/21/2024    5:00 AM 04/20/2024    4:28 PM  Last 3 Weights  Weight (lbs) 200 lb 13.4 oz 201 lb 202 lb 12.8 oz  Weight (kg) 91.1 kg 91.173 kg 91.989 kg     Body mass index is 36.73 kg/m.  General:  Well nourished, well developed, in no acute distress HEENT: normal Neck: no JVD Endocrine:  No thryomegaly Vascular: No carotid bruits; FA pulses 2+ bilaterally without bruits  Cardiac:  normal S1, S2; RRR; 3/6 systolic murmur throughout, most prominent RUSB Lungs:  clear to auscultation bilaterally, no wheezing, rhonchi or rales  Abd: soft, nontender, no hepatomegaly  Ext: trivial edema  Skin: warm and dry  Psych:  Normal affect   Telemetry:  Telemetry was personally reviewed and demonstrates:  AF/RVR->NSR today   Relevant CV Studies:  RHC/Coronary angiography  Result date: 04/21/24   Prox RCA-1 lesion is 90%  stenosed.   Prox RCA-2 lesion is 90% stenosed.   Mid RCA lesion is 90% stenosed.   RPDA lesion is 40% stenosed.   1st Mrg lesion is 95% stenosed.   Prox LAD to Mid LAD lesion is 50% stenosed.   Mid LAD lesion is 80% stenosed. Severe three vessel CAD Moderate calcified stenosis throughout the proximal LAD. Severe mid LAD stenosis Severe calcified stenosis in the large caliber obtuse marginal branch Dominant RCA with severe calcified lesion in the  proximal and mid vessel.  Elevated right heart pressures with wedge pressure 30-35 mmHg Recommendations: Will need to consider CABG/AVR given multi-vessel CAD and severe AS. 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 Currently with uncontrolled atrial fib on amiodarone  and volume overload. Will hold Plavix  and ask CT surgery to see her.   Laboratory Data:  High Sensitivity Troponin:   Recent Labs  Lab 04/03/24 2231 04/04/24 0105 04/04/24 0538  TROPONINIHS 13 23* 160*     Chemistry Recent Labs  Lab 04/20/24 0510 04/21/24 0405 04/21/24 1021 04/21/24 1032 04/22/24 0149  NA 140 136 145 137  132* 137  K 3.3* 3.7 3.0* 3.6  3.6 4.2  CL 100 98  --   --  99  CO2 29 24  --   --  23  GLUCOSE 158* 174*  --   --  151*  BUN 35* 29*  --   --  30*  CREATININE 0.88 0.74  --   --  0.96  CALCIUM 8.5* 8.4*  --   --  8.4*  GFRNONAA >60 >60  --   --  >60  ANIONGAP 11 14  --   --  15    Recent Labs  Lab 04/18/24 0526 04/22/24 0149  PROT 6.3* 6.3*  ALBUMIN 4.0 3.4*  AST 15 15  ALT 14 15  ALKPHOS 96 79  BILITOT 0.8 1.5*   Hematology Recent Labs  Lab 04/19/24 0522 04/20/24 0510 04/21/24 1021 04/21/24 1032 04/22/24 0149  WBC 8.9 10.9*  --   --  10.8*  RBC 4.07 3.36*  --   --  3.88  HGB 12.3 10.1* 10.2* 11.6*  11.6* 11.6*  HCT 38.0 31.8* 30.0* 34.0*  34.0* 35.8*  MCV 93.4 94.6  --   --  92.3  MCH 30.2 30.1  --   --  29.9  MCHC 32.4 31.8  --   --  32.4  RDW 15.9* 15.9*  --   --  16.0*  PLT 235 207  --   --  258   BNP Recent Labs  Lab 04/19/24 0522  PROBNP 6,770.0*    Radiology/Studies:  CARDIAC CATHETERIZATION Result Date: 04/21/2024   Prox RCA-1 lesion is 90% stenosed.   Prox RCA-2 lesion is 90% stenosed.   Mid RCA lesion is 90% stenosed.   RPDA lesion is 40% stenosed.   1st Mrg lesion is 95% stenosed.   Prox LAD to Mid LAD lesion is 50% stenosed.   Mid LAD lesion is 80% stenosed. Severe three vessel CAD Moderate calcified stenosis throughout the  proximal LAD. Severe mid LAD stenosis Severe calcified stenosis in the large caliber obtuse marginal branch Dominant RCA with severe calcified lesion in the proximal and mid vessel. Elevated right heart pressures with wedge pressure 30-35 mmHg Recommendations: Will need to consider CABG/AVR given multi-vessel CAD and severe AS. 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 Currently with uncontrolled atrial fib on amiodarone  and volume  overload. Will hold Plavix  and ask CT surgery to see her.   Assessment and Plan:   Emily [name] is a 73 y.o. female with persistent AF/RVR, prior GIB s/p Watchman implant, severe AAS, multivessel CAD, HTN, HLD, DM2, and hypothyroidism who is being followed for management of persistent AF/AFL with RVR along with multivessel CAD and severe AS.  Persistent AF/RVR AFL/RVR Bifascicular block  -- s/p watchman, has been off anticoagulation 2/2 recurrent GI bleeds -- failed flecainide , previous admission 11/04 with AF/RVR but converted without meds, started amio 200 mg daily  -- presented back with atrial fibrillation with RVR, initially placed on IV Dilt, but converted to IV amio. Converted to SR the morning of 11/20 but back into AF that afternoon, now back in NSR  -- if AF/AFL with RVR continues to be an issue despite adequate amiodarone  load then may need to consider AVN/pace strategy, with baseline bifascicular block she is high risk for needing PPM in the future with either TAVR/SAVR so may end up with [facility] either way, reviewed her GI hx and she had extensive evaluation and attempted tx for recurrent GIB over the years at [facility], I don't think she will be able to tolerate 90 days OAC post ablation and so don't think ablation is a reasonable option for rhythm management at this point  -- continue Toprol  XL 50mg  daily -- continue dilt IR 30 mg q6h for now  -- on digoxin  0.125 mg daily, plan to d/c before discharge    2. Aortic Stenosis -- 11/2023 echo:  LVE 60-65%, no WMAs, severe asymmetrical LVH, normal RV, severe LAE  AVA VTI 0.74 mean grad 36 DI 0.23 SVI 42. Moderate to severe AS with majority of criteria favoring severe.  -- 04/2024 echo: LVE 45-50%, indet diastolic, normal RV function, mild to mod MS mean grad 5, severe AS mean grad 28 AVA VTI 0.61 DI 0.23 SVI 23. Lower than expected gradient due to low SVI.  -- transferred to [facility] and underwent cardiac cath today with multivessel CAD. CT surgery consulted for consideration of CABG/ AVR - Dr. Daniel reviewed her case, likely high risk for SAVR/CABG but will evaluate, CT TAVR on Monday once HR better controlled and HF sx improved    3. Acute on chronic HFpEF with newly reduced EF -- Echo this admission with drop in LVEF to 45-50%, normal RV -- CXR with trace pleural effusions and BNP 6770 on admission -- on IV lasix  60mg  BID -- RHC: PWP 30-52mmHg, CI 2.62/CO 5.01 - She did not tolerate SGLT2 inhibitors due to recurrent UTIs. - will add spironolactone  25 mg daily    4. 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   Heart catheterization 04/21/2024: Elevated right heart pressure with pulmonary capillary wedge of 30 to 35 mmHg. Multivessel coronary disease. Severe three vessel CAD Moderate calcified stenosis throughout the proximal LAD. Severe mid LAD stenosis Severe calcified stenosis in the large caliber obtuse marginal branch Dominant RCA with severe calcified lesion in the proximal and mid vesse  For questions or updates, please contact [facility] Please consult [email or web address] for contact info under   Signed, Donnice DELENA Primus, MD  04/22/2024 6:43 PM

## 2024-04-23 DIAGNOSIS — I480 Paroxysmal atrial fibrillation: Secondary | ICD-10-CM | POA: Diagnosis not present

## 2024-04-23 LAB — BASIC METABOLIC PANEL WITH GFR
Anion gap: 13 (ref 5–15)
BUN: 32 mg/dL — ABNORMAL HIGH (ref 8–23)
CO2: 26 mmol/L (ref 22–32)
Calcium: 8.5 mg/dL — ABNORMAL LOW (ref 8.9–10.3)
Chloride: 99 mmol/L (ref 98–111)
Creatinine, Ser: 0.91 mg/dL (ref 0.44–1.00)
GFR, Estimated: >60 mL/min (ref >60)
Glucose, Bld: 103 mg/dL — ABNORMAL HIGH (ref 70–99)
Potassium: 4 mmol/L (ref 3.5–5.1)
Sodium: 138 mmol/L (ref 135–145)

## 2024-04-23 LAB — GLUCOSE, CAPILLARY
Glucose-Capillary: 143 mg/dL — ABNORMAL HIGH (ref 70–99)
Glucose-Capillary: 160 mg/dL — ABNORMAL HIGH (ref 70–99)
Glucose-Capillary: 170 mg/dL — ABNORMAL HIGH (ref 70–99)
Glucose-Capillary: 165 mg/dL — ABNORMAL HIGH (ref 70–99)

## 2024-04-23 LAB — MAGNESIUM: Magnesium: 2 mg/dL (ref 1.7–2.4)

## 2024-04-23 MED ORDER — DILTIAZEM HCL ER COATED BEADS 120 MG PO CP24: 120.0000 mg | ORAL_CAPSULE | Freq: Every day | ORAL | Status: DC

## 2024-04-23 MED ORDER — METOPROLOL SUCCINATE ER 100 MG PO TB24
100.0000 mg | ORAL_TABLET | Freq: Every day | ORAL | Status: DC
  Administered 2024-04-23 – 2024-04-25 (×3): 100 mg via ORAL
  Filled 2024-04-23 (×3): qty 1

## 2024-04-23 MED ORDER — FUROSEMIDE 40 MG PO TABS
40.0000 mg | ORAL_TABLET | Freq: Every day | ORAL | Status: DC
  Administered 2024-04-23 – 2024-04-25 (×3): 40 mg via ORAL
  Filled 2024-04-23 (×3): qty 1

## 2024-04-23 NOTE — Progress Notes (Signed)
 Progress Note  Patient Name: Emily [name] Date of Encounter: 04/23/2024  Primary Cardiologist: Lynwood Schilling, MD   Patient ID  Emily [name] is a 73 y.o. female with persistent AF/RVR, prior GIB s/p Watchman implant, severe AAS, multivessel CAD, HTN, HLD, DM2, and hypothyroidism who is being followed for management of persistent AF/AFL with RVR along with multivessel CAD and severe AS.   Subjective   No overnight events. Off O2 and no recurrent arrhythmias since yesterday AM. Converted amio IV to amio PO 400 mg bid last night. Consolidated AVNB this morning, switched lasix  IV->PO. D/c digoxin . Awaiting CT scan tomorrow for further planning by CTS and whether she is complex PCI/TAVR vs CABG/SAVR candidate.    Inpatient Medications    Scheduled Meds:  amiodarone   400 mg Oral BID   digoxin   0.125 mg Oral Daily   diltiazem   30 mg Oral Q6H   docusate sodium   100 mg Oral BID   ezetimibe   10 mg Oral Daily   furosemide   60 mg Intravenous BID   insulin  aspart  0-15 Units Subcutaneous TID WC   insulin  aspart  0-5 Units Subcutaneous QHS   levothyroxine   112 mcg Oral Q48H   levothyroxine   168 mcg Oral Q48H   metoprolol  succinate  50 mg Oral Daily   polyethylene glycol  17 g Oral Daily   pravastatin   40 mg Oral q1800   rOPINIRole   0.5 mg Oral QHS   sodium chloride  flush  3 mL Intravenous Q12H   spironolactone   25 mg Oral Daily   Continuous Infusions:  PRN Meds: acetaminophen  **OR** acetaminophen , bisacodyl , ipratropium-albuterol , LORazepam , mouth rinse, prochlorperazine , sodium chloride  flush   Vital Signs    Vitals:   04/22/24 1645 04/22/24 1958 04/23/24 0036 04/23/24 0500  BP: (!) 154/59 (!) 153/45 (!) 141/45 (!) 161/56  Pulse: 60   (!) 57  Resp: 18  18 18   Temp: 97.8 F (36.6 C) 97.9 F (36.6 C) 98.1 F (36.7 C) 97.7 F (36.5 C)  TempSrc: Oral Oral Oral Oral  SpO2: 100%     Weight:    89.2 kg  Height:        Intake/Output Summary (Last 24 hours) at  04/23/2024 0641 Last data filed at 04/22/2024 1000 Gross per 24 hour  Intake 600 ml  Output --  Net 600 ml   Filed Weights   04/21/24 0500 04/22/24 0354 04/23/24 0500  Weight: 91.2 kg 91.1 kg 89.2 kg   Telemetry    NSR 50-60s overnight, no recurrent AF - Personally Reviewed  ECG    None today   Physical Exam   GEN: No acute distress.   Neck: No JVD Cardiac: RRR, 3/6 systolic murmur throughout, most prominent RUSB Respiratory: Clear to auscultation bilaterally. GI: Soft, nontender, non-distended  MS: No edema; No deformity. Neuro:  Nonfocal  Psych: Normal affect   Labs    Chemistry Recent Labs  Lab 04/18/24 0526 04/19/24 0522 04/21/24 0405 04/21/24 1021 04/21/24 1032 04/22/24 0149 04/23/24 0305  NA 140   < > 136   < > 137  132* 137 138  K 3.5   < > 3.7   < > 3.6  3.6 4.2 4.0  CL 102   < > 98  --   --  99 99  CO2 26   < > 24  --   --  23 26  GLUCOSE 159*   < > 174*  --   --  151* 103*  BUN 29*   < >  29*  --   --  30* 32*  CREATININE 0.81   < > 0.74  --   --  0.96 0.91  CALCIUM 8.8*   < > 8.4*  --   --  8.4* 8.5*  PROT 6.3*  --   --   --   --  6.3*  --   ALBUMIN 4.0  --   --   --   --  3.4*  --   AST 15  --   --   --   --  15  --   ALT 14  --   --   --   --  15  --   ALKPHOS 96  --   --   --   --  79  --   BILITOT 0.8  --   --   --   --  1.5*  --   GFRNONAA >60   < > >60  --   --  >60 >60  ANIONGAP 11   < > 14  --   --  15 13   < > = values in this interval not displayed.    Hematology Recent Labs  Lab 04/19/24 0522 04/20/24 0510 04/21/24 1021 04/21/24 1032 04/22/24 0149  WBC 8.9 10.9*  --   --  10.8*  RBC 4.07 3.36*  --   --  3.88  HGB 12.3 10.1* 10.2* 11.6*  11.6* 11.6*  HCT 38.0 31.8* 30.0* 34.0*  34.0* 35.8*  MCV 93.4 94.6  --   --  92.3  MCH 30.2 30.1  --   --  29.9  MCHC 32.4 31.8  --   --  32.4  RDW 15.9* 15.9*  --   --  16.0*  PLT 235 207  --   --  258   BNP Recent Labs  Lab 04/19/24 0522  PROBNP 6,770.0*     DDimer No  results for input(s): DDIMER in the last 168 hours.   Radiology    CARDIAC CATHETERIZATION Result Date: 04/21/2024   Prox RCA-1 lesion is 90% stenosed.   Prox RCA-2 lesion is 90% stenosed.   Mid RCA lesion is 90% stenosed.   RPDA lesion is 40% stenosed.   1st Mrg lesion is 95% stenosed.   Prox LAD to Mid LAD lesion is 50% stenosed.   Mid LAD lesion is 80% stenosed. Severe three vessel CAD Moderate calcified stenosis throughout the proximal LAD. Severe mid LAD stenosis Severe calcified stenosis in the large caliber obtuse marginal branch Dominant RCA with severe calcified lesion in the proximal and mid vessel. Elevated right heart pressures with wedge pressure 30-35 mmHg Recommendations: Will need to consider CABG/AVR given multi-vessel CAD and severe AS. 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 Currently with uncontrolled atrial fib on amiodarone  and volume overload. Will hold Plavix  and ask CT surgery to see her.    Cardiac Studies   RHC/Coronary angiography  Result date: 04/21/24   Prox RCA-1 lesion is 90% stenosed.   Prox RCA-2 lesion is 90% stenosed.   Mid RCA lesion is 90% stenosed.   RPDA lesion is 40% stenosed.   1st Mrg lesion is 95% stenosed.   Prox LAD to Mid LAD lesion is 50% stenosed.   Mid LAD lesion is 80% stenosed. Severe three vessel CAD Moderate calcified stenosis throughout the proximal LAD. Severe mid LAD stenosis Severe calcified stenosis in the large caliber obtuse marginal branch Dominant RCA with severe calcified lesion  in the proximal and mid vessel.  Elevated right heart pressures with wedge pressure 30-35 mmHg Recommendations: Will need to consider CABG/AVR given multi-vessel CAD and severe AS. 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 Currently with uncontrolled atrial fib on amiodarone  and volume overload. Will hold Plavix  and ask CT surgery to see her.   Assessment & Plan   Emily [name] is a 74 y.o. female  with persistent AF/RVR, prior GIB s/p Watchman implant, severe AAS, multivessel CAD, HTN, HLD, DM2, and hypothyroidism who is being followed for management of persistent AF/AFL with RVR along with multivessel CAD and severe AS.   Persistent AF/RVR AFL/RVR Bifascicular block  -- s/p watchman, has been off anticoagulation 2/2 recurrent GI bleeds -- failed flecainide , previous admission 11/04 with AF/RVR but converted without meds, started amio 200 mg daily  -- presented back with atrial fibrillation with RVR, initially placed on IV Dilt, but converted to IV amio. Converted to SR the morning of 11/20 but back into AF that afternoon, now back in NSR and has maintained NSR for almost 24 hours, will increase toprol  XL 50 mg daily->100 mg daily and d/c dilt IR 30 mg q6h to simplify her regimen, also d/c digoxin  as would like her to be off of this before discharge and just on amio+toprol  XL  -- if AF/AFL with RVR continues to be an issue despite adequate amiodarone  load then may need to consider AVN/pace strategy, with baseline bifascicular block she is high risk for needing PPM in the future with either TAVR/SAVR so may end up with CHB either way, reviewed her GI hx and she had extensive evaluation and attempted tx for recurrent GIB over the years at [facility], I don't think she will be able to tolerate 90 days OAC post ablation and so don't think ablation is a reasonable option for rhythm management at this point    2. Aortic Stenosis -- 11/2023 echo: LVE 60-65%, no WMAs, severe asymmetrical LVH, normal RV, severe LAE  AVA VTI 0.74 mean grad 36 DI 0.23 SVI 42. Moderate to severe AS with majority of criteria favoring severe.  -- 04/2024 echo: LVE 45-50%, indet diastolic, normal RV function, mild to mod MS mean grad 5, severe AS mean grad 28 AVA VTI 0.61 DI 0.23 SVI 23. Lower than expected gradient due to low SVI.  -- transferred to [facility] and underwent cardiac cath today with multivessel CAD. CT surgery consulted for  consideration of CABG/ AVR - Dr. Daniel reviewed her case, likely high risk for SAVR/CABG but will evaluate, CT TAVR on Monday once HR better controlled and HF sx improved    3. Acute on chronic HFpEF with newly reduced EF -- Echo this admission with drop in LVEF to 45-50%, normal RV -- CXR with trace pleural effusions and BNP 6770 on admission -- on IV lasix  60mg  BID, d/c today and switch to lasix  40 mg PO daily (get back to maintenance dose, was on 20-40 mg OP), IO -677 yesterday -- RHC: PWP 30-91mmHg, CI 2.62/CO 5.01 - She did not tolerate SGLT2 inhibitors due to recurrent UTIs. - continue spironolactone  25 mg daily    4. 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   Heart catheterization 04/21/2024: Elevated right heart pressure with pulmonary capillary wedge of 30 to 35 mmHg. Multivessel coronary disease. Severe three vessel CAD Moderate calcified stenosis throughout the proximal LAD. Severe mid LAD stenosis Severe calcified stenosis in the large caliber  obtuse marginal branch Dominant RCA with severe calcified lesion in the proximal and mid vesse   For questions or updates, please contact [facility] Please consult [email or web address] for contact info under Cardiology/STEMI.   Signed,  Donnice DELENA Primus, MD [facility]  Cardiac Electrophysiology  04/23/2024, 6:41 AM

## 2024-04-23 NOTE — Progress Notes (Signed)
 Per Ganji okay to give 100 mg of metoprolol  with hr of 57. Dig to be d/c'd. Will continue to monitor the pt. Elasha Tess R, RN

## 2024-04-23 NOTE — Plan of Care (Signed)
  Problem: Education: Goal: Knowledge of General Education information will improve Description: Including pain rating scale, medication(s)/side effects and non-pharmacologic comfort measures Outcome: Progressing   Problem: Health Behavior/Discharge Planning: Goal: Ability to manage health-related needs will improve Outcome: Progressing   Problem: Clinical Measurements: Goal: Ability to maintain clinical measurements within normal limits will improve Outcome: Progressing Goal: Will remain free from infection Outcome: Progressing Goal: Diagnostic test results will improve Outcome: Progressing Goal: Respiratory complications will improve Outcome: Progressing Goal: Cardiovascular complication will be avoided Outcome: Progressing   Problem: Activity: Goal: Risk for activity intolerance will decrease Outcome: Progressing   Problem: Nutrition: Goal: Adequate nutrition will be maintained Outcome: Progressing   Problem: Coping: Goal: Level of anxiety will decrease Outcome: Progressing   Problem: Elimination: Goal: Will not experience complications related to bowel motility Outcome: Progressing Goal: Will not experience complications related to urinary retention Outcome: Progressing   Problem: Pain Managment: Goal: General experience of comfort will improve and/or be controlled Outcome: Progressing   Problem: Safety: Goal: Ability to remain free from injury will improve Outcome: Progressing   Problem: Skin Integrity: Goal: Risk for impaired skin integrity will decrease Outcome: Progressing   Problem: Education: Goal: Knowledge of disease or condition will improve Outcome: Progressing Goal: Understanding of medication regimen will improve Outcome: Progressing Goal: Individualized Educational Video(s) Outcome: Progressing   Problem: Activity: Goal: Ability to tolerate increased activity will improve Outcome: Progressing   Problem: Cardiac: Goal: Ability to achieve  and maintain adequate cardiopulmonary perfusion will improve Outcome: Progressing   Problem: Health Behavior/Discharge Planning: Goal: Ability to safely manage health-related needs after discharge will improve Outcome: Progressing   Problem: Education: Goal: Ability to describe self-care measures that may prevent or decrease complications (Diabetes Survival Skills Education) will improve Outcome: Progressing Goal: Individualized Educational Video(s) Outcome: Progressing   Problem: Coping: Goal: Ability to adjust to condition or change in health will improve Outcome: Progressing   Problem: Fluid Volume: Goal: Ability to maintain a balanced intake and output will improve Outcome: Progressing   Problem: Health Behavior/Discharge Planning: Goal: Ability to identify and utilize available resources and services will improve Outcome: Progressing Goal: Ability to manage health-related needs will improve Outcome: Progressing   Problem: Metabolic: Goal: Ability to maintain appropriate glucose levels will improve Outcome: Progressing   Problem: Nutritional: Goal: Maintenance of adequate nutrition will improve Outcome: Progressing Goal: Progress toward achieving an optimal weight will improve Outcome: Progressing   Problem: Skin Integrity: Goal: Risk for impaired skin integrity will decrease Outcome: Progressing   Problem: Tissue Perfusion: Goal: Adequacy of tissue perfusion will improve Outcome: Progressing   Problem: Education: Goal: Understanding of CV disease, CV risk reduction, and recovery process will improve Outcome: Progressing Goal: Individualized Educational Video(s) Outcome: Progressing   Problem: Activity: Goal: Ability to return to baseline activity level will improve Outcome: Progressing   Problem: Cardiovascular: Goal: Ability to achieve and maintain adequate cardiovascular perfusion will improve Outcome: Progressing Goal: Vascular access site(s) Level  0-1 will be maintained Outcome: Progressing   Problem: Health Behavior/Discharge Planning: Goal: Ability to safely manage health-related needs after discharge will improve Outcome: Progressing

## 2024-04-23 NOTE — Progress Notes (Signed)
 PROGRESS NOTE  Emily [name] FMW:[id number] DOB: November 10, 1950 DOA: 04/17/2024 PCP: Jolinda Norene HERO, DO   LOS: 5 days   Brief Narrative / Interim history: 73 year old female with history of HTN, HLD, DM2, chronic A-fib with Watchman device in place, hypothyroidism, severe AAS, DM2 comes into the hospital with left lower extremity edema.  She was found to be in A-fib with RVR and cardiology was consulted, also fluid overloaded and is being diuresed.  She was initially admitted [facility], transferred to [facility] for structural heart team evaluation for her aortic stenosis, as well as left and right heart cath  Subjective / 24h Interval events: Feels well today, no chest pain, no shortness of breath.  Has converted to sinus rhythm since yesterday  Assesement and Plan: Principal problem A-fib/flutter with RVR-she was initially placed on amiodarone , converted to sinus rhythm, but then A-fib/flutter recurred.  Cardiology following, appreciate input.  She is on diltiazem , metoprolol , amiodarone  - Converted to sinus rhythm yesterday.  Remains in sinus this morning  Active problems CAD-left heart cath 11/21 showed multivessel CAD.  Cardiothoracic surgery engaged to evaluate candidacy for CABG/AVR, this is to be decided hopefully tomorrow - Pending CT chest/TAVR protocol on Monday per cardiology  Acute on chronic diastolic CHF-found to be fluid overloaded on admission, has received IV diuresis as per cardiology.  Continue furosemide   Severe AS-being evaluated by cards structural team.  Left and right heart cath as above, pending CT chest/TAVR protocol on Monday  DM 2, with hyperglycemia-continue insulin , CBGs acceptable  CBG (last 3)  Recent Labs    04/22/24 1654 04/22/24 2049 04/23/24 0748  GLUCAP 153* 183* 143*   Hypothyroidism-continue Synthroid .  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  She would benefit from weight loss  Scheduled Meds:  amiodarone   400 mg Oral BID   docusate sodium   100 mg Oral BID   ezetimibe   10 mg Oral Daily   furosemide   40 mg Oral Daily   insulin  aspart  0-15 Units Subcutaneous TID WC   insulin  aspart  0-5 Units Subcutaneous QHS   levothyroxine   112 mcg Oral Q48H   levothyroxine   168 mcg Oral Q48H   metoprolol  succinate  100 mg Oral Daily   polyethylene glycol  17 g Oral Daily   pravastatin   40 mg Oral q1800   rOPINIRole   0.5 mg Oral QHS   sodium chloride  flush  3 mL Intravenous Q12H   spironolactone   25 mg Oral Daily   Continuous Infusions:   PRN Meds:.acetaminophen  **OR** acetaminophen , bisacodyl , ipratropium-albuterol , LORazepam , mouth rinse, prochlorperazine , sodium chloride  flush  Current Outpatient Medications  Medication Instructions   amiodarone  (PACERONE ) 200 mg, Oral, Daily   clopidogrel  (PLAVIX ) 75 mg, Oral, Daily   diltiazem  (CARDIZEM ) 30 mg, Oral, 3 times daily PRN   diltiazem  (CARTIA  XT) 240 mg, Oral, Daily   diphenhydramine -acetaminophen  (TYLENOL  PM) 25-500 MG TABS tablet 1 tablet, At bedtime PRN   ezetimibe  (ZETIA ) 10 mg, Oral, Daily   fluticasone  (FLONASE ) 50 MCG/ACT nasal spray 2 sprays, Each Nare, Daily   furosemide  (LASIX ) 20 mg, Oral, Daily PRN   gabapentin  (NEURONTIN ) 300 mg, Oral, 2 times daily PRN   levonorgestrel (MIRENA) 20 MCG/DAY IUD 1 each,  Once   levothyroxine  (SYNTHROID ) 112 MCG tablet TAKE 1 TABLET BY MOUTH EVERY OTHER DAY, ALTERNATING WITH 1 & 1/2 TABLETS EVERY OTHER DAY   linaclotide  (LINZESS ) 290 mcg, Oral, Daily before breakfast  losartan  (COZAAR ) 25 mg, Oral, Daily   lovastatin  (MEVACOR ) 40 mg, Oral, Every evening   metFORMIN  (GLUCOPHAGE ) 1,000 mg, Oral, 2 times daily with meals   metoprolol  succinate (TOPROL -XL) 50 mg, Oral, Nightly, Hold if systolic BP  less than 100 or HR less than 50 bpm   polyethylene glycol (MIRALAX  / GLYCOLAX ) 17 g, Daily PRN    rOPINIRole  (REQUIP ) 0.5 mg, Oral, Daily at bedtime   Semaglutide (0.25 or 0.5MG /DOS) 0.5 mg, Subcutaneous, Weekly    Diet Orders (From admission, onward)     Start     Ordered   04/22/24 1202  Diet heart healthy/carb modified Room service appropriate? Yes; Fluid consistency: Thin  Diet effective now       Question Answer Comment  Diet-HS Snack? Nothing   Room service appropriate? Yes   Fluid consistency: Thin      04/22/24 1202            DVT prophylaxis: SCDs Start: 04/18/24 0004   Lab Results  Component Value Date   PLT 258 04/22/2024      Code Status: Full Code  Family Communication: No family at bedside  Status is: Inpatient Remains inpatient appropriate because: Severity of illness   Level of care: Telemetry  Consultants:  Cardiology  Objective: Vitals:   04/22/24 1958 04/23/24 0036 04/23/24 0500 04/23/24 0746  BP: (!) 153/45 (!) 141/45 (!) 161/56 (!) 151/49  Pulse:   (!) 57   Resp:  18 18 16   Temp: 97.9 F (36.6 C) 98.1 F (36.7 C) 97.7 F (36.5 C) 98 F (36.7 C)  TempSrc: Oral Oral Oral Oral  SpO2:    96%  Weight:   89.2 kg   Height:       No intake or output data in the 24 hours ending 04/23/24 1046  Wt Readings from Last 3 Encounters:  04/23/24 89.2 kg  04/12/24 92.1 kg  04/07/24 93 kg    Examination:  Constitutional: NAD Eyes: lids and conjunctivae normal, no scleral icterus ENMT: mmm Neck: normal, supple Respiratory: clear to auscultation bilaterally, no wheezing, no crackles. Normal respiratory effort.  Cardiovascular: Regular rate and rhythm, no murmurs / rubs / gallops. Trace LE edema. Abdomen: soft, no distention, no tenderness. Bowel sounds positive.   Data Reviewed: I have independently reviewed following labs and imaging studies   CBC Recent Labs  Lab 04/17/24 1259 04/18/24 0526 04/19/24 0522 04/20/24 0510 04/21/24 1021 04/21/24 1032 04/22/24 0149  WBC 7.4 8.4 8.9 10.9*  --   --  10.8*  HGB 10.7* 10.6* 12.3  10.1* 10.2* 11.6*  11.6* 11.6*  HCT 34.0* 32.9* 38.0 31.8* 30.0* 34.0*  34.0* 35.8*  PLT 251 234 235 207  --   --  258  MCV 95.5 94.3 93.4 94.6  --   --  92.3  MCH 30.1 30.4 30.2 30.1  --   --  29.9  MCHC 31.5 32.2 32.4 31.8  --   --  32.4  RDW 16.1* 16.0* 15.9* 15.9*  --   --  16.0*    Recent Labs  Lab 04/18/24 0526 04/19/24 0522 04/20/24 0510 04/21/24 0405 04/21/24 1021 04/21/24 1032 04/21/24 1216 04/22/24 0149 04/23/24 0305  NA 140 138 140 136 145 137  132*  --  137 138  K 3.5 3.8 3.3* 3.7 3.0* 3.6  3.6  --  4.2 4.0  CL 102 99 100 98  --   --   --  99 99  CO2 26 23 29 24   --   --   --  23 26  GLUCOSE 159* 286* 158* 174*  --   --   --  151* 103*  BUN 29* 31* 35* 29*  --   --   --  30* 32*  CREATININE 0.81 0.99 0.88 0.74  --   --   --  0.96 0.91  CALCIUM 8.8* 8.9 8.5* 8.4*  --   --   --  8.4* 8.5*  AST 15  --   --   --   --   --   --  15  --   ALT 14  --   --   --   --   --   --  15  --   ALKPHOS 96  --   --   --   --   --   --  79  --   BILITOT 0.8  --   --   --   --   --   --  1.5*  --   ALBUMIN 4.0  --   --   --   --   --   --  3.4*  --   MG 2.1 1.8 2.2 1.8  --   --   --  2.2 2.0  TSH  --   --   --   --   --   --  1.179  --   --     ------------------------------------------------------------------------------------------------------------------ No results for input(s): CHOL, HDL, LDLCALC, TRIG, CHOLHDL, LDLDIRECT in the last 72 hours.  Lab Results  Component Value Date   HGBA1C 5.5 04/04/2024   ------------------------------------------------------------------------------------------------------------------ Recent Labs    04/21/24 1216  TSH 1.179    Cardiac Enzymes No results for input(s): CKMB, TROPONINI, MYOGLOBIN in the last 168 hours.  Invalid input(s): CK ------------------------------------------------------------------------------------------------------------------    Component Value Date/Time   BNP 558.5 (H) 04/03/2024  2231    CBG: Recent Labs  Lab 04/22/24 0817 04/22/24 1152 04/22/24 1654 04/22/24 2049 04/23/24 0748  GLUCAP 147* 260* 153* 183* 143*    Recent Results (from the past 240 hours)  MRSA Next Gen by PCR, Nasal     Status: None   Collection Time: 04/17/24  9:23 PM   Specimen: Nasal Mucosa; Nasal Swab  Result Value Ref Range Status   MRSA by PCR Next Gen NOT DETECTED NOT DETECTED Final    Comment: (NOTE) The GeneXpert MRSA Assay (FDA approved for NASAL specimens only), is one component of a comprehensive MRSA colonization surveillance program. It is not intended to diagnose MRSA infection nor to guide or monitor treatment for MRSA infections. Test performance is not FDA approved in patients less than 70 years old. Performed at [facility], [address] [place], [place] [address]      Radiology Studies: No results found.   Nilda Fendt, MD, PhD [facility]  Between 7 am - 7 pm I am available, please contact me via Amion (for emergencies) or Securechat (non urgent messages)  Between 7 pm - 7 am I am not available, please contact night coverage MD/APP via Amion

## 2024-04-24 ENCOUNTER — Inpatient Hospital Stay (HOSPITAL_COMMUNITY)

## 2024-04-24 DIAGNOSIS — I25118 Atherosclerotic heart disease of native coronary artery with other forms of angina pectoris: Secondary | ICD-10-CM | POA: Diagnosis not present

## 2024-04-24 DIAGNOSIS — I350 Nonrheumatic aortic (valve) stenosis: Secondary | ICD-10-CM

## 2024-04-24 DIAGNOSIS — I2510 Atherosclerotic heart disease of native coronary artery without angina pectoris: Secondary | ICD-10-CM

## 2024-04-24 DIAGNOSIS — I480 Paroxysmal atrial fibrillation: Secondary | ICD-10-CM | POA: Diagnosis not present

## 2024-04-24 LAB — GLUCOSE, CAPILLARY
Glucose-Capillary: 207 mg/dL — ABNORMAL HIGH (ref 70–99)
Glucose-Capillary: 178 mg/dL — ABNORMAL HIGH (ref 70–99)
Glucose-Capillary: 150 mg/dL — ABNORMAL HIGH (ref 70–99)
Glucose-Capillary: 193 mg/dL — ABNORMAL HIGH (ref 70–99)

## 2024-04-24 LAB — BASIC METABOLIC PANEL WITH GFR
Anion gap: 13 (ref 5–15)
BUN: 27 mg/dL — ABNORMAL HIGH (ref 8–23)
CO2: 31 mmol/L (ref 22–32)
Calcium: 9.1 mg/dL (ref 8.9–10.3)
Chloride: 95 mmol/L — ABNORMAL LOW (ref 98–111)
Creatinine, Ser: 0.45 mg/dL (ref 0.44–1.00)
GFR, Estimated: >60 mL/min (ref >60)
Glucose, Bld: 225 mg/dL — ABNORMAL HIGH (ref 70–99)
Potassium: 4.1 mmol/L (ref 3.5–5.1)
Sodium: 139 mmol/L (ref 135–145)

## 2024-04-24 LAB — VAS US DOPPLER PRE CABG
Left ABI: 0.71
Right ABI: 0.78

## 2024-04-24 MED ORDER — IOHEXOL 350 MG/ML SOLN
80.0000 mL | Freq: Once | INTRAVENOUS | Status: AC | PRN
  Administered 2024-04-24: 80 mL via INTRAVENOUS

## 2024-04-24 NOTE — Progress Notes (Signed)
 Chaplain responded to a consult request for Advance Directive education. Emily [name] was in the company of her nephew.   Chaplain provided the Advance Directive packet as well as education on Advance Directives-documents an individual completes to communicate their health care directions in advance of a time when they may need them. Chaplain informed pt the documents which may be completed here in the hospital are the Living Will and Health Care Power of [place].   Chaplain informed that the Health Care Power of [place] is a legal document in which an individual names another person, their Health Care Agent, to make health care decisions when the individual is not able to make them for themselves. The Health Care Agent's function can be temporary or permanent depending on the pt's ability to make and communicate those decisions independently. Chaplain informed pt in the absence of a Health Care Power of Attorney, the state of [place]  directs health care providers to look to the following individuals in the order listed: legal guardian; an attorney?in?fact under a general power of attorney (POA) if that POA includes the right to make health care decisions; a husband or wife; a majority of parents and adult children; a majority of adult brothers and sisters; or an individual who has an established relationship with you, who is acting in good faith and who can convey your wishes.  If none of these person are available or willing to make medical decisions on a patient's behalf, the law allows the patient's doctor to make decisions for them as long as another doctor agrees with those decisions.  Chaplain also informed the patient that the Health Care agent has no decision-making authority over any affairs other than those related to his or her medical care.   The chaplain further educated the pt that a Living Will is a legal document that allows an individual to state his or her desire not to receive  life-prolonging measures in the event that they have a condition that is incurable and will result in their death in a short period of time; they are unconscious, and doctors are confident that they will not regain consciousness; and/or they have advanced dementia or other substantial and irreversible loss of mental function. The chaplain informed pt that life-prolonging measures are medical treatments that would only serve to postpone death, including breathing machines, kidney dialysis, antibiotics, artificial nutrition and hydration (tube feeding), and similar forms of treatment and that if an individual is able to express their wishes, they may also make them known without the use of a Living Will, but in the event that an individual is not able to express their wishes themselves, a Living Will allows medical providers and the pt's family and friends ensure that they are not making decisions on the pt's behalf, but rather serving as the pt's voice to convey decisions the pt has already made.   The patient is aware that the decision to create an advance directive is theirs alone and they may chose not to complete the documents or may chose to complete one portion or both.  The patient was informed that they can revoke the documents at any time by striking through them and writing void or by completing new documents, but that it is also advisable that the individual verbally notify interested parties that their wishes have changed.  They are also aware that the document must be signed in the presence of a notary public and two witnesses and that this can be done while  the patient is still admitted to the hospital or after discharge in the community. If they decide to complete Advance Directives after being discharged from the hospital, they have been advised to notify all interested parties and to provide those documents to their physicians and loved ones in addition to bringing them to the hospital in the  event of another hospitalization.   The chaplain informed the pt that if they desire to proceed with completing Advance Directive Documentation while they are still admitted, notary services are typically available at [facility] between the hours of 1:00 and 3:30 Monday-Thursday.    When the patient is ready to have these documents completed, the patient should request that their nurse place a spiritual care consult and indicate that the patient is ready to have their advance directives notarized so that arrangements for witnesses and notary public can be made.  Please page spiritual care if the patient desires further education or has questions.        Alan HERO. Davee Lomax, M.Div. [facility] Chaplain Pager [phone] Office [phone]     04/24/24 1600  Spiritual Encounters  Type of Visit Initial  Care provided to: Family;Pt and family;Patient  Referral source Patient request  Reason for visit Advance directives  Advance Directives (For Healthcare)  Does Patient Have a Medical Advance Directive? No  Would patient like information on creating a medical advance directive? Yes (Inpatient - patient defers creating a medical advance directive at this time - Information given)

## 2024-04-24 NOTE — Plan of Care (Signed)
  Problem: Education: Goal: Knowledge of General Education information will improve Description: Including pain rating scale, medication(s)/side effects and non-pharmacologic comfort measures Outcome: Progressing   Problem: Health Behavior/Discharge Planning: Goal: Ability to manage health-related needs will improve Outcome: Progressing   Problem: Clinical Measurements: Goal: Ability to maintain clinical measurements within normal limits will improve Outcome: Progressing Goal: Will remain free from infection Outcome: Progressing Goal: Diagnostic test results will improve Outcome: Progressing Goal: Respiratory complications will improve Outcome: Progressing Goal: Cardiovascular complication will be avoided Outcome: Progressing   Problem: Activity: Goal: Risk for activity intolerance will decrease Outcome: Progressing   Problem: Nutrition: Goal: Adequate nutrition will be maintained Outcome: Progressing   Problem: Coping: Goal: Level of anxiety will decrease Outcome: Progressing   Problem: Elimination: Goal: Will not experience complications related to bowel motility Outcome: Progressing Goal: Will not experience complications related to urinary retention Outcome: Progressing   Problem: Pain Managment: Goal: General experience of comfort will improve and/or be controlled Outcome: Progressing   Problem: Safety: Goal: Ability to remain free from injury will improve Outcome: Progressing   Problem: Skin Integrity: Goal: Risk for impaired skin integrity will decrease Outcome: Progressing   Problem: Education: Goal: Knowledge of disease or condition will improve Outcome: Progressing Goal: Understanding of medication regimen will improve Outcome: Progressing Goal: Individualized Educational Video(s) Outcome: Progressing   Problem: Activity: Goal: Ability to tolerate increased activity will improve Outcome: Progressing   Problem: Cardiac: Goal: Ability to achieve  and maintain adequate cardiopulmonary perfusion will improve Outcome: Progressing   Problem: Health Behavior/Discharge Planning: Goal: Ability to safely manage health-related needs after discharge will improve Outcome: Progressing   Problem: Education: Goal: Ability to describe self-care measures that may prevent or decrease complications (Diabetes Survival Skills Education) will improve Outcome: Progressing Goal: Individualized Educational Video(s) Outcome: Progressing   Problem: Coping: Goal: Ability to adjust to condition or change in health will improve Outcome: Progressing   Problem: Fluid Volume: Goal: Ability to maintain a balanced intake and output will improve Outcome: Progressing   Problem: Health Behavior/Discharge Planning: Goal: Ability to identify and utilize available resources and services will improve Outcome: Progressing Goal: Ability to manage health-related needs will improve Outcome: Progressing   Problem: Metabolic: Goal: Ability to maintain appropriate glucose levels will improve Outcome: Progressing   Problem: Nutritional: Goal: Maintenance of adequate nutrition will improve Outcome: Progressing Goal: Progress toward achieving an optimal weight will improve Outcome: Progressing   Problem: Skin Integrity: Goal: Risk for impaired skin integrity will decrease Outcome: Progressing   Problem: Tissue Perfusion: Goal: Adequacy of tissue perfusion will improve Outcome: Progressing   Problem: Education: Goal: Understanding of CV disease, CV risk reduction, and recovery process will improve Outcome: Progressing Goal: Individualized Educational Video(s) Outcome: Progressing   Problem: Activity: Goal: Ability to return to baseline activity level will improve Outcome: Progressing   Problem: Cardiovascular: Goal: Ability to achieve and maintain adequate cardiovascular perfusion will improve Outcome: Progressing Goal: Vascular access site(s) Level  0-1 will be maintained Outcome: Progressing   Problem: Health Behavior/Discharge Planning: Goal: Ability to safely manage health-related needs after discharge will improve Outcome: Progressing

## 2024-04-24 NOTE — Progress Notes (Signed)
 PROGRESS NOTE  Emily [name] FMW:[id number] DOB: 16-Jan-1951 DOA: 04/17/2024 PCP: Jolinda Norene HERO, DO   LOS: 6 days   Brief Narrative / Interim history: 73 year old female with history of HTN, HLD, DM2, chronic A-fib with Watchman device in place, hypothyroidism, severe AAS, DM2 comes into the hospital with left lower extremity edema.  She was found to be in A-fib with RVR and cardiology was consulted, also fluid overloaded and is being diuresed.  She was initially admitted [facility], transferred to [facility] for structural heart team evaluation for her aortic stenosis, as well as left and right heart cath  Subjective / 24h Interval events: Continues to feel well, no chest pain, no palpitations.  No nausea or vomiting  Assesement and Plan: Principal problem A-fib/flutter with RVR-she was initially placed on amiodarone , converted to sinus rhythm, but then A-fib/flutter recurred.  Cardiology following, appreciate input.  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  Cardiothoracic surgery engaged to evaluate candidacy for CABG/AVR, this is to be decided hopefully tomorrow - Pending CT chest/TAVR protocol today per cardiology.  Further decision to be made later on about surgical versus nonsurgical options moving forward  Acute on chronic diastolic CHF-found to be fluid overloaded on admission, has received IV diuresis as per cardiology.  Continue furosemide   Severe AS-being evaluated by cards structural team.  Left and right heart cath as above, pending CT chest/TAVR protocol on Monday  DM 2, with hyperglycemia-continue insulin , CBGs acceptable  CBG (last 3)  Recent Labs    04/23/24 1703 04/23/24 2153 04/24/24 0929  GLUCAP 170* 165* 207*   Hypothyroidism-continue Synthroid .  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  She would benefit from weight loss  Scheduled Meds:  amiodarone   400 mg Oral BID   docusate sodium   100 mg Oral BID   ezetimibe   10 mg Oral Daily   furosemide   40 mg Oral Daily   insulin  aspart  0-15 Units Subcutaneous TID WC   insulin  aspart  0-5 Units Subcutaneous QHS   levothyroxine   112 mcg Oral Q48H   levothyroxine   168 mcg Oral Q48H   metoprolol  succinate  100 mg Oral Daily   polyethylene glycol  17 g Oral Daily   pravastatin   40 mg Oral q1800   rOPINIRole   0.5 mg Oral QHS   sodium chloride  flush  3 mL Intravenous Q12H   spironolactone   25 mg Oral Daily   Continuous Infusions:   PRN Meds:.acetaminophen  **OR** acetaminophen , bisacodyl , ipratropium-albuterol , LORazepam , mouth rinse, prochlorperazine , sodium chloride  flush  Current Outpatient Medications  Medication Instructions   amiodarone  (PACERONE ) 200 mg, Oral, Daily   clopidogrel  (PLAVIX ) 75 mg, Oral, Daily   diltiazem  (CARDIZEM ) 30 mg, Oral, 3 times daily PRN   diltiazem  (CARTIA  XT) 240 mg, Oral, Daily   diphenhydramine -acetaminophen  (TYLENOL  PM) 25-500 MG TABS tablet 1 tablet, At bedtime PRN   ezetimibe  (ZETIA ) 10 mg, Oral, Daily   fluticasone  (FLONASE ) 50 MCG/ACT nasal spray 2 sprays, Each Nare, Daily   furosemide  (LASIX ) 20 mg, Oral, Daily PRN   gabapentin  (NEURONTIN ) 300 mg, Oral, 2 times daily PRN   levonorgestrel (MIRENA) 20 MCG/DAY IUD 1 each,  Once   levothyroxine  (SYNTHROID ) 112 MCG tablet TAKE 1 TABLET BY MOUTH EVERY OTHER DAY, ALTERNATING WITH 1 & 1/2 TABLETS EVERY OTHER DAY  linaclotide  (LINZESS ) 290 mcg, Oral, Daily before breakfast   losartan  (COZAAR ) 25 mg, Oral, Daily   lovastatin  (MEVACOR ) 40 mg, Oral, Every evening   metFORMIN  (GLUCOPHAGE ) 1,000 mg, Oral, 2 times daily with meals   metoprolol  succinate (TOPROL -XL) 50 mg, Oral, Nightly, Hold if systolic BP  less than 100 or HR less than 50  bpm   polyethylene glycol (MIRALAX  / GLYCOLAX ) 17 g, Daily PRN   rOPINIRole  (REQUIP ) 0.5 mg, Oral, Daily at bedtime   Semaglutide (0.25 or 0.5MG /DOS) 0.5 mg, Subcutaneous, Weekly    Diet Orders (From admission, onward)     Start     Ordered   04/22/24 1202  Diet heart healthy/carb modified Room service appropriate? Yes; Fluid consistency: Thin  Diet effective now       Question Answer Comment  Diet-HS Snack? Nothing   Room service appropriate? Yes   Fluid consistency: Thin      04/22/24 1202            DVT prophylaxis: SCDs Start: 04/18/24 0004   Lab Results  Component Value Date   PLT 258 04/22/2024      Code Status: Full Code  Family Communication: No family at bedside  Status is: Inpatient Remains inpatient appropriate because: Severity of illness   Level of care: Telemetry  Consultants:  Cardiology  Objective: Vitals:   04/23/24 2157 04/23/24 2321 04/24/24 0504 04/24/24 0813  BP: (!) 156/55 (!) 159/52 (!) 151/54 (!) 142/40  Pulse: (!) 58 63 60 (!) 59  Resp: 18 16 16 16   Temp: 97.7 F (36.5 C) 97.9 F (36.6 C) 98.1 F (36.7 C) 97.7 F (36.5 C)  TempSrc: Oral Oral Oral Oral  SpO2: 99% 95% 94% 92%  Weight:   88.8 kg   Height:        Intake/Output Summary (Last 24 hours) at 04/24/2024 9060 Last data filed at 04/24/2024 0933 Gross per 24 hour  Intake 603 ml  Output 1950 ml  Net -1347 ml    Wt Readings from Last 3 Encounters:  04/24/24 88.8 kg  04/12/24 92.1 kg  04/07/24 93 kg    Examination: Constitutional: NAD Eyes: lids and conjunctivae normal, no scleral icterus ENMT: mmm Neck: normal, supple Respiratory: clear to auscultation bilaterally, no wheezing, no crackles. Normal respiratory effort.  Cardiovascular: Regular rate and rhythm, no murmurs / rubs / gallops. No LE edema. Abdomen: soft, no distention, no tenderness. Bowel sounds positive.   Data Reviewed: I have independently reviewed following labs and imaging studies    CBC Recent Labs  Lab 04/17/24 1259 04/18/24 0526 04/19/24 0522 04/20/24 0510 04/21/24 1021 04/21/24 1032 04/22/24 0149  WBC 7.4 8.4 8.9 10.9*  --   --  10.8*  HGB 10.7* 10.6* 12.3 10.1* 10.2* 11.6*  11.6* 11.6*  HCT 34.0* 32.9* 38.0 31.8* 30.0* 34.0*  34.0* 35.8*  PLT 251 234 235 207  --   --  258  MCV 95.5 94.3 93.4 94.6  --   --  92.3  MCH 30.1 30.4 30.2 30.1  --   --  29.9  MCHC 31.5 32.2 32.4 31.8  --   --  32.4  RDW 16.1* 16.0* 15.9* 15.9*  --   --  16.0*    Recent Labs  Lab 04/18/24 0526 04/19/24 0522 04/20/24 0510 04/21/24 0405 04/21/24 1021 04/21/24 1032 04/21/24 1216 04/22/24 0149 04/23/24 0305  NA 140 138 140 136 145 137  132*  --  137 138  K 3.5 3.8 3.3* 3.7 3.0* 3.6  3.6  --  4.2 4.0  CL 102 99 100 98  --   --   --  99 99  CO2 26 23 29 24   --   --   --  23 26  GLUCOSE 159* 286* 158* 174*  --   --   --  151* 103*  BUN 29* 31* 35* 29*  --   --   --  30* 32*  CREATININE 0.81 0.99 0.88 0.74  --   --   --  0.96 0.91  CALCIUM 8.8* 8.9 8.5* 8.4*  --   --   --  8.4* 8.5*  AST 15  --   --   --   --   --   --  15  --   ALT 14  --   --   --   --   --   --  15  --   ALKPHOS 96  --   --   --   --   --   --  79  --   BILITOT 0.8  --   --   --   --   --   --  1.5*  --   ALBUMIN 4.0  --   --   --   --   --   --  3.4*  --   MG 2.1 1.8 2.2 1.8  --   --   --  2.2 2.0  TSH  --   --   --   --   --   --  1.179  --   --     ------------------------------------------------------------------------------------------------------------------ No results for input(s): CHOL, HDL, LDLCALC, TRIG, CHOLHDL, LDLDIRECT in the last 72 hours.  Lab Results  Component Value Date   HGBA1C 5.5 04/04/2024   ------------------------------------------------------------------------------------------------------------------ Recent Labs    04/21/24 1216  TSH 1.179    Cardiac Enzymes No results for input(s): CKMB, TROPONINI, MYOGLOBIN in the last 168  hours.  Invalid input(s): CK ------------------------------------------------------------------------------------------------------------------    Component Value Date/Time   BNP 558.5 (H) 04/03/2024 2231    CBG: Recent Labs  Lab 04/23/24 0748 04/23/24 1311 04/23/24 1703 04/23/24 2153 04/24/24 0929  GLUCAP 143* 160* 170* 165* 207*    Recent Results (from the past 240 hours)  MRSA Next Gen by PCR, Nasal     Status: None   Collection Time: 04/17/24  9:23 PM   Specimen: Nasal Mucosa; Nasal Swab  Result Value Ref Range Status   MRSA by PCR Next Gen NOT DETECTED NOT DETECTED Final    Comment: (NOTE) The GeneXpert MRSA Assay (FDA approved for NASAL specimens only), is one component of a comprehensive MRSA colonization surveillance program. It is not intended to diagnose MRSA infection nor to guide or monitor treatment for MRSA infections. Test performance is not FDA approved in patients less than 25 years old. Performed at [facility], [address], [place], [place] [address]      Radiology Studies: No results found.   Nilda Fendt, MD, PhD [facility]  Between 7 am - 7 pm I am available, please contact me via Amion (for emergencies) or Securechat (non urgent messages)  Between 7 pm - 7 am I am not available, please contact night coverage MD/APP via Amion

## 2024-04-24 NOTE — Progress Notes (Signed)
 Pre-CABG testing has been completed. Preliminary results can be found in CV Proc through chart review.   04/24/24 1:56 PM Cathlyn Collet RVT

## 2024-04-24 NOTE — TOC Progression Note (Signed)
 Transition of Care [facility]) - Progression Note    Patient Details  Name: Emily [name] MRN: [id number] Date of Birth: 07/24/1950  Transition of Care [facility]) CM/SW Contact  Graves-Bigelow, Erminio Deems, RN Phone Number: 04/24/2024, 3:09 PM  Clinical Narrative: Patient admitted for paroxysmal atrial fibrillation with RVR. Patient states she is independent from home alone and still works as a interior and spatial designer. Patient states she has DME cane and rolling walker if needed. Patient has insurance and PCP- still drives to appointments. Nephew was in the room during the visit. ICM will continue to follow for additional needs.   Expected Discharge Plan: Home/Self Care Barriers to Discharge: Continued Medical Work up  Expected Discharge Plan and Services In-house Referral: Clinical Social Work     Living arrangements for the past 2 months: Single Family Home  Social Drivers of Health (SDOH) Interventions SDOH Screenings   Food Insecurity: No Food Insecurity (04/20/2024)  Housing: Low Risk  (04/20/2024)  Transportation Needs: No Transportation Needs (04/20/2024)  Utilities: Not At Risk (04/20/2024)  Depression (PHQ2-9): Low Risk  (04/12/2024)  Financial Resource Strain: Low Risk  (04/21/2022)  Physical Activity: Inactive (04/21/2022)  Social Connections: Moderately Integrated (04/20/2024)  Stress: No Stress Concern Present (04/21/2022)  Tobacco Use: Low Risk  (04/17/2024)   Readmission Risk Interventions    04/18/2024   10:23 AM  Readmission Risk Prevention Plan  Transportation Screening Complete  HRI or Home Care Consult Complete  Social Work Consult for Recovery Care Planning/Counseling Complete  Palliative Care Screening Not Applicable  Medication Review Oceanographer) Complete

## 2024-04-24 NOTE — Plan of Care (Signed)
  Problem: Education: Goal: Knowledge of General Education information will improve Description: Including pain rating scale, medication(s)/side effects and non-pharmacologic comfort measures Outcome: Progressing   Problem: Health Behavior/Discharge Planning: Goal: Ability to manage health-related needs will improve Outcome: Progressing   Problem: Clinical Measurements: Goal: Respiratory complications will improve Outcome: Progressing Goal: Cardiovascular complication will be avoided Outcome: Progressing   Problem: Activity: Goal: Risk for activity intolerance will decrease Outcome: Progressing   Problem: Nutrition: Goal: Adequate nutrition will be maintained Outcome: Progressing   Problem: Coping: Goal: Level of anxiety will decrease Outcome: Progressing   Problem: Elimination: Goal: Will not experience complications related to bowel motility Outcome: Progressing Goal: Will not experience complications related to urinary retention Outcome: Progressing   Problem: Skin Integrity: Goal: Risk for impaired skin integrity will decrease Outcome: Progressing   Problem: Education: Goal: Knowledge of disease or condition will improve Outcome: Progressing Goal: Understanding of medication regimen will improve Outcome: Progressing Goal: Individualized Educational Video(s) Outcome: Progressing

## 2024-04-24 NOTE — Progress Notes (Addendum)
 Progress Note  Patient Name: Emily [name] Date of Encounter: 04/24/2024 [facility] Cardiologist: Lynwood Schilling, MD   Interval Summary   She feels well today, we discussed extensively about all of her different conditions and current plan and management.  No chest pain or shortness of breath.  Vital Signs Vitals:   04/23/24 2157 04/23/24 2321 04/24/24 0504 04/24/24 0813  BP: (!) 156/55 (!) 159/52 (!) 151/54 (!) 142/40  Pulse: (!) 58 63 60 (!) 59  Resp: 18 16 16 16   Temp: 97.7 F (36.5 C) 97.9 F (36.6 C) 98.1 F (36.7 C) 97.7 F (36.5 C)  TempSrc: Oral Oral Oral Oral  SpO2: 99% 95% 94% 92%  Weight:   88.8 kg   Height:        Intake/Output Summary (Last 24 hours) at 04/24/2024 1033 Last data filed at 04/24/2024 0933 Gross per 24 hour  Intake 603 ml  Output 1950 ml  Net -1347 ml      04/24/2024    5:04 AM 04/23/2024    5:00 AM 04/22/2024    3:54 AM  Last 3 Weights  Weight (lbs) 195 lb 11.2 oz 196 lb 11.2 oz 200 lb 13.4 oz  Weight (kg) 88.769 kg 89.223 kg 91.1 kg      Telemetry/ECG  Maintaining sinus rhythm heart rate between 50-60.- Personally Reviewed  Physical Exam  GEN: No acute distress.   Neck: No JVD Cardiac: RRR, 3 out of 6 murmur left sternal border  respiratory: Clear to auscultation bilaterally. GI: Soft, nontender, non-distended  MS: No edema  Patient Profile Patient with past medical history significant for persistent AF/RVR, prior GIB s/p Watchman implant, severe AS, multivessel CAD, heart failure with mildly reduced EF, HTN, HLD, DM2, hypothyroidism   Cardiology is seen patient for issues of persistent atrial fibrillation/flutter with RVR, complicated by CHF exacerbation and multivessel CAD with severe aortic stenosis.  Assessment & Plan   Persistent atrial fibrillation/flutter with RVR Bifascicular block Status post Watchman 2020 secondary to recurrent GI bleeds -Presented with A-fib RVR and converted to sinus rhythm on IV  amiodarone  11/20.  Had paroxysmal episode but has been in sinus rhythm since. She continues to remain in sinus rhythm while on p.o. amiodarone . Continue with amiodarone  400 mg twice daily.  Will defer taper regiment to MD.   She has failed flecainide  previously.  Diltiazem  and digoxin  have been discontinued. Per EP, if she continues to have persistent episodes despite amiodarone  may need to consider AVN/pace strategy with baseline bifascicular block she is high risk for needing PPM in the future given tentative plans for TAVR versus SAVR.  Not felt to be able to tolerate 90 days of DOAC if ablation was pursued.  Severe low-flow low gradient aortic stenosis -11/2023 echo: LVE 60-65%, no WMAs, severe asymmetrical LVH, normal RV, severe LAE  AVA VTI 0.74 mean grad 36 DI 0.23 SVI 42. Moderate to severe AS with majority of criteria favoring severe.  -04/2024 echo: LVE 45-50%, indet diastolic, normal RV function, mild to mod MS mean grad 5, severe AS mean grad 28 AVA VTI 0.61 DI 0.23 SVI 23. Lower than expected gradient due to low SVI.   She just had CT scan today, CT surgery has already been consulted for consideration of CABG/AVR vs TAVR and PCI not felt to be a great surgical candidate.  Lives at home independently but functionally limited due to other noncardiac reasons and neuropathy.  Has no children, her support system is her nieces and nephews.  MVD CAD See above. Has never had any chest pain.  LDL 80, goal LDL less than 55.  She is on pravastatin  40 mg but appears to have statin intolerance.  Also on Zetia .  May consider PCSK9 inhibitor outpatient.  Acute on chronic heart failure with mildly reduced EF -11/18 EF 45 to 50% -11/21 elevated right heart pressures with a wedge of 30-35, MVD.  Preserved CO. She has been transition back to p.o. Lasix  and appears to be euvolemic.  Doing well today. GDMT: Continue with spironolactone  25 mg daily, Toprol -XL 100 mg.  Would be cautious with titration of  GDMT as she has severe valve disease and preload dependent.  Managing her volume status may be difficult, but compensated today.  No SGLT2 inhibitor due to recurrent UTIs. Continue with p.o. Lasix  40 mg daily.  Carotid Artery Disease  Carotid ultrasounds in 11/2023 showed 50-75% stenosis in right ICA, 1-39% stenosis in left ICA.  She follows with vascular surgery, they are repeating scans in 06/2024  LVH  Noted on CMRI in 03/2024, measuring 16 mm. Thought to be due to severe AS.  GI bleeding hx Also complicates clinical picture and best long term options.     For questions or updates, please contact [facility] Please consult [email or web address] for contact info under       Signed, Thom LITTIE Sluder, PA-C      ATTENDING ATTESTATION  I have seen, examined and evaluated the patient this morning along with Thom Sluder, PA.  After reviewing all the available data and chart, we discussed the patients laboratory, study & physical findings as well as symptoms in detail.  I agree with his findings, examination as well as impression recommendations as per our discussion.    Complicated scenario with the patient who has severe AAS and multivessel CAD.  Felt to be potentially high risk for CABG/SAVR with the other option being multivessel PCI/TAVR.  Plan is to evaluate with a chest CT today to help determine candidacy for CABG.  I discussed with her the potential pros and cons of both options, 1 potential point to consider that she had a Watchman device placed because of history of bleed and concerns of being on DOAC.  Where she did require multivessel PCI, she would be on DAPT for short period of time then Thienopyridine for a longer period time just simply based on the amount of stent placement that would be required.  She is clinically stable at this point.  No active heart failure symptoms.  No anginal symptoms.  On stable regimen simply waiting plans for completion of  treatment.    Alm MICAEL Clay, MD, MS Alm Clay, M.D., M.S. Interventional Cardiologist  [facility] Pager # [phone]

## 2024-04-24 NOTE — Consult Note (Incomplete)
 [facility]ART [facility] VASC[facility]ENTER   MULTIDISCIPLINARY HEART VALVE TEAM  Cardiology Consultation:   Patient ID: Emily [name] MRN: [id number]; DOB: [birth year]  Admit date: 04/17/2024 Date of Consult: 04/24/2024  Primary Care Provider: Jolinda Norene HERO, DO [facility] Cardiologist: Emily Schilling, MD  [facility] Electrophysiologist:  Emily Gladis Norton, MD    Patient Profile:   Emily [name] is a 73 y.o. female with a hx of PAF s/p Watchman due to recurrent GI bleeding, DMT2 with peripheral neuropathy, HTN, HLD, carotid artery disease s/p L CEA (2017) and R TCAR (03/2023) with 50-75%, severe asymmetric LVH (cMRI felt to be 2/2 AS), severe 3V CAD and severe LFLG AS currently admitted with acute HFrEF and afib with RVR (now converted on IV amiodarone ) who is being seen today for the evaluation of severe LFLG AS at the request of Dr. Daniel.  History of Present Illness:   Emily [name] lives independently and works as a interior and spatial designer. She is followed by Emily [name]. She admittedly is not the most active but walks unassisted and able to take care of all her own ADLs. Her only family support are nieces and nephews.    Carotid/extremity dopplers 04/25/24 with 50-75% stenosis in stented RICA and moderate bilateral LE PAD.      Past Medical History:  Diagnosis Date   Anemia    Arthritis    Atrial fibrillation, rapid (HCC) 10/27/2013   Bilateral carotid artery disease    Complication of anesthesia    difficult intubation in past   Diabetes mellitus without complication (HCC)    type 2   Difficult intubation    states 'lady that did the sleep study told me I have the smallest airway she has ever seen in an adult   Dyslipidemia 06/29/2017   Heart murmur    History of blood transfusion 09/2019   GI bleed - in CE   Hypertension    Hypothyroid    Obesity    Obstructive sleep apnea    occasional uses CPAP   Peripheral vascular disease    Persistent atrial fibrillation (HCC)  01/16/2020   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 - xrays were negative   Upper GI bleed 09/25/2019    Past Surgical History:  Procedure Laterality Date   CATARACT EXTRACTION Left    CATARACT EXTRACTION W/PHACO Right 12/09/2015   Procedure: CATARACT EXTRACTION PHACO AND INTRAOCULAR LENS PLACEMENT (IOC);  Surgeon: Cherene Mania, MD;  Location: [facility];  Service: Ophthalmology;  Laterality: Right;  CDE:10.48   COLONOSCOPY W/ POLYPECTOMY     cyst on back of neck removed     DILATION AND CURETTAGE OF UTERUS     x 5   ENDARTERECTOMY Left 08/30/2015   Procedure: LEFT CAROYID ENDARTERECTOMY WITH XENOSURE BOVINE PERICARDIUM PATCH ANGIOPLASTY;  Surgeon: Emily LELON New, MD;  Location: [facility] OR;  Service: Vascular;  Laterality: Left;   RIGHT/LEFT HEART CATH AND CORONARY ANGIOGRAPHY N/A 04/21/2024   Procedure: RIGHT/LEFT HEART CATH AND CORONARY ANGIOGRAPHY;  Surgeon: Emily Lonni BIRCH, MD;  Location: [facility] INVASIVE CV [facility];  Service: Cardiovascular;  Laterality: N/A;   TONSILLECTOMY     TRANSCAROTID ARTERY REVASCULARIZATION  Right 03/19/2023   Procedure: Right Transcarotid Artery Revascularization;  Surgeon: [name] Emily LELON, MD;  [name];  Service: Vascular;  Laterality: Right;   ULTRASOUND GUIDANCE FOR VASCULAR ACCESS Left 03/19/2023   Procedure: ULTRASOUND GUIDANCE FOR VASCULAR ACCESS;  Surgeon: [name] Emily LELON, MD;  Location: [facility] OR;  Service: Vascular;  Laterality: Left;   UPPER GI ENDOSCOPY     x several     {Home Medications (Optional):21181}  Inpatient Medications: Scheduled Meds:  amiodarone   400 mg Oral BID   docusate sodium   100 mg Oral BID   ezetimibe   10 mg Oral Daily   furosemide   40 mg Oral Daily   insulin  aspart  0-15 Units Subcutaneous TID WC   insulin  aspart  0-5 Units Subcutaneous QHS   levothyroxine   112 mcg Oral Q48H   levothyroxine   168 mcg Oral Q48H   metoprolol  succinate  100 mg Oral Daily   polyethylene glycol  17 g Oral Daily    pravastatin   40 mg Oral q1800   rOPINIRole   0.5 mg Oral QHS   sodium chloride  flush  3 mL Intravenous Q12H   spironolactone   25 mg Oral Daily   Continuous Infusions:  PRN Meds: acetaminophen  **OR** acetaminophen , bisacodyl , ipratropium-albuterol , LORazepam , mouth rinse, prochlorperazine , sodium chloride  flush  Allergies:    Allergies  Allergen Reactions   Statins Other (See Comments) and Cough    Not all statins but some cause cough and pain in legs.   Jardiance  [Empagliflozin ] Other (See Comments)    Recurrent vaginitis   Quinapril Hcl Cough   Tape Other (See Comments)    Redness, please use paper tape    Social History:   Social History   Socioeconomic History   Marital status: Single    Spouse name: Not on file   Number of children: Not on file   Years of education: Not on file   Highest education level: Not on file  Occupational History   Not on file  Tobacco Use   Smoking status: Never    Passive exposure: Never   Smokeless tobacco: Never  Vaping Use   Vaping status: Never Used  Substance and Sexual Activity   Alcohol use: No    Alcohol/week: 0.0 standard drinks of alcohol   Drug use: Never   Sexual activity: Not Currently    Birth control/protection: Post-menopausal  Other Topics Concern   Not on file  Social History Narrative   Not on file   Social Drivers of Health   Financial Resource Strain: Low Risk  (04/21/2022)   Overall Financial Resource Strain (CARDIA)    Difficulty of Paying Living Expenses: Not hard at all  Food Insecurity: No Food Insecurity (04/20/2024)   Hunger Vital Sign    Worried About Running Out of Food in the Last Year: Never true    Ran Out of Food in the Last Year: Never true  Transportation Needs: No Transportation Needs (04/20/2024)   PRAPARE - Administrator, Civil Service (Medical): No    Lack of Transportation (Non-Medical): No  Physical Activity: Inactive (04/21/2022)   Exercise Vital Sign    Days of  Exercise per Week: 0 days    Minutes of Exercise per Session: 0 min  Stress: No Stress Concern Present (04/21/2022)   [employer] of Occupational Health - Occupational Stress Questionnaire    Feeling of Stress : Not at all  Social Connections: Moderately Integrated (04/20/2024)   Social Connection and Isolation Panel    Frequency of Communication with Friends and Family: More than three times a week    Frequency of Social Gatherings with Friends and Family: More than three times a week    Attends Religious Services: More than 4 times per year    Active Member of [employer] or Organizations: Yes    Attends [employer]  or Organization Meetings: More than 4 times per year    Marital Status: Never married  Intimate Partner Violence: Not At Risk (04/20/2024)   Humiliation, Afraid, Rape, and Kick questionnaire    Fear of Current or Ex-Partner: No    Emotionally Abused: No    Physically Abused: No    Sexually Abused: No    Family History:   *** Family History  Problem Relation Age of Onset   COPD Father    Heart failure Father    Heart disease Father    Emphysema Father    Arrhythmia Sister    Arrhythmia Sister    Arrhythmia Sister        had PPM also   Cancer Sister      ROS:  Please see the history of present illness.  *** All other ROS reviewed and negative.     Physical Exam/Data:   Vitals:   04/23/24 0500 04/24/24 0504 04/24/24 0813 04/24/24 1152  BP:  (!) 151/54 (!) 142/40 (!) 144/54  Pulse:  60 (!) 59 (!) 56  Resp:  16 16 17   Temp:  98.1 F (36.7 C) 97.7 F (36.5 C) 97.7 F (36.5 C)  TempSrc:  Oral Oral Oral  SpO2:  94% 92% 97%  Weight: 89.2 kg 88.8 kg    Height:        Intake/Output Summary (Last 24 hours) at 04/24/2024 1501 Last data filed at 04/24/2024 0933 Gross per 24 hour  Intake 363 ml  Output 900 ml  Net -537 ml      04/24/2024    5:04 AM 04/23/2024    5:00 AM 04/22/2024    3:54 AM  Last 3 Weights  Weight (lbs) 195 lb 11.2 oz 196 lb 11.2 oz 200  lb 13.4 oz  Weight (kg) 88.769 kg 89.223 kg 91.1 kg     Body mass index is 35.79 kg/m.  General:  Well nourished, well developed, in no acute distress*** HEENT: normal Lymph: no adenopathy Neck: no JVD Endocrine:  No thryomegaly Vascular: No carotid bruits; FA pulses 2+ bilaterally without bruits  Cardiac:  normal S1, S2; RRR; no murmur *** Lungs:  clear to auscultation bilaterally, no wheezing, rhonchi or rales  Abd: soft, nontender, no hepatomegaly  Ext: no edema Musculoskeletal:  No deformities, BUE and BLE strength normal and equal Skin: warm and dry  Neuro:  CNs 2-12 intact, no focal abnormalities noted Psych:  Normal affect   EKG:  The EKG was personally reviewed and demonstrates:  *** Telemetry:  Telemetry was personally reviewed and demonstrates:  ***  Cardiac Studies & Procedures   ______________________________________________________________________________________________ CARDIAC CATHETERIZATION  CARDIAC CATHETERIZATION 04/21/2024  Conclusion   Prox RCA-1 lesion is 90% stenosed.   Prox RCA-2 lesion is 90% stenosed.   Mid RCA lesion is 90% stenosed.   RPDA lesion is 40% stenosed.   1st Mrg lesion is 95% stenosed.   Prox LAD to Mid LAD lesion is 50% stenosed.   Mid LAD lesion is 80% stenosed.  Severe three vessel CAD Moderate calcified stenosis throughout the proximal LAD. Severe mid LAD stenosis Severe calcified stenosis in the large caliber obtuse marginal branch Dominant RCA with severe calcified lesion in the proximal and mid vessel. Elevated right heart pressures with wedge pressure 30-35 mmHg  Recommendations: Emily need to consider CABG/AVR given multi-vessel CAD and severe AS. 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 Currently with uncontrolled atrial fib on amiodarone  and volume overload. Emily hold Plavix   and ask CT surgery to see her.  Findings Coronary Findings Diagnostic  Dominance: Right  Left Anterior Descending Prox  LAD to Mid LAD lesion is 50% stenosed. The lesion is calcified. Mid LAD lesion is 80% stenosed. The lesion is calcified.  Left Circumflex  First Obtuse Marginal Branch 1st Mrg lesion is 95% stenosed. The lesion is calcified.  Right Coronary Artery Vessel is large. Prox RCA-1 lesion is 90% stenosed. The lesion is calcified. Prox RCA-2 lesion is 90% stenosed. The lesion is calcified. Mid RCA lesion is 90% stenosed. The lesion is calcified.  Right Posterior Descending Artery RPDA lesion is 40% stenosed.  Intervention  No interventions have been documented.   STRESS TESTS  NM MYOCAR MULTI W/SPECT W 09/17/2020  Narrative  There was no ST segment deviation noted during stress.  Findings consistent with prior distal anteior/apical myocardial infarction with mild peri-infarct ischemia.  The left ventricular ejection fraction is normal (55-65%).  Low risk based on perfusion imaging. Elevated TID 1.41 may suggest higher relative risk.   ECHOCARDIOGRAM  ECHOCARDIOGRAM COMPLETE 04/18/2024  Narrative ECHOCARDIOGRAM REPORT    Patient Name:   Emily [name] Date of Exam: 04/18/2024 Medical Rec #:  [id number]        Height:       62.0 in Accession #:    [id number]       Weight:       204.3 lb Date of Birth:  11-09-1950        BSA:          1.929 m Patient Age:    73 years         BP:           102/78 mmHg Patient Gender: F                HR:           106 bpm. Exam Location:  [facility]  Procedure: 2D Echo, Cardiac Doppler, Color Doppler and Intracardiac Opacification Agent (Both Spectral and Color Flow Doppler were utilized during procedure).  Indications:    Aortic stenosis I35.0  History:        Patient has prior history of Echocardiogram examinations, most recent 12/07/2023. Arrythmias:Atrial Fibrillation; Risk Factors:Hypertension.  Sonographer:    Tinnie Gosling RDCS Referring Phys: [id number] DORN FALCON BRANCH  IMPRESSIONS   1. Left ventricular ejection fraction,  by estimation, is 45 to 50%. The left ventricle has mildly decreased function. Left ventricular endocardial border not optimally defined to evaluate regional wall motion. There is severe left ventricular hypertrophy. Left ventricular diastolic parameters are indeterminate. 2. Right ventricular systolic function is normal. The right ventricular size is normal. Tricuspid regurgitation signal is inadequate for assessing PA pressure. 3. Left atrial size was mildly dilated. 4. The mitral valve is abnormal. Mild mitral valve regurgitation. Mild to moderate mitral stenosis. The mean mitral valve gradient is 5.0 mmHg. Moderate mitral annular calcification. 5. AVA VTI 0.61, mean gradient 28 mmHg, DI 0.23, SVI 23. Severe aortic stenosis with lower than expected gradient due to decreased stroke volume index. . The aortic valve is tricuspid. There is moderate calcification of the aortic valve. There is moderate thickening of the aortic valve. Aortic valve regurgitation is not visualized. Severe aortic valve stenosis. 6. The inferior vena cava is dilated in size with >50% respiratory variability, suggesting right atrial pressure of 8 mmHg.  FINDINGS Left Ventricle: Left ventricular ejection fraction, by estimation, is 45 to 50%. The left ventricle has mildly decreased function. Left  ventricular endocardial border not optimally defined to evaluate regional wall motion. The left ventricular internal cavity size was normal in size. There is severe left ventricular hypertrophy. Left ventricular diastolic parameters are indeterminate.  Right Ventricle: The right ventricular size is normal. Right vetricular wall thickness was not well visualized. Right ventricular systolic function is normal. Tricuspid regurgitation signal is inadequate for assessing PA pressure.  Left Atrium: Left atrial size was mildly dilated.  Right Atrium: Right atrial size was normal in size.  Pericardium: There is no evidence of pericardial  effusion.  Mitral Valve: The mitral valve is abnormal. There is moderate thickening of the mitral valve leaflet(s). There is moderate calcification of the mitral valve leaflet(s). Moderate mitral annular calcification. Mild mitral valve regurgitation. Mild to moderate mitral valve stenosis. The mean mitral valve gradient is 5.0 mmHg.  Tricuspid Valve: The tricuspid valve is not well visualized. Tricuspid valve regurgitation is not demonstrated. No evidence of tricuspid stenosis.  Aortic Valve: AVA VTI 0.61, mean gradient 28 mmHg, DI 0.23, SVI 23. Severe aortic stenosis with lower than expected gradient due to decreased stroke volume index. The aortic valve is tricuspid. There is moderate calcification of the aortic valve. There is moderate thickening of the aortic valve. There is moderate aortic valve annular calcification. Aortic valve regurgitation is not visualized. Severe aortic stenosis is present. Aortic valve mean gradient measures 27.5 mmHg. Aortic valve peak gradient measures 42.6 mmHg. Aortic valve area, by VTI measures 0.61 cm.  Pulmonic Valve: The pulmonic valve was not well visualized. Pulmonic valve regurgitation is mild. No evidence of pulmonic stenosis.  Aorta: The aortic root and ascending aorta are structurally normal, with no evidence of dilitation.  Venous: The inferior vena cava is dilated in size with greater than 50% respiratory variability, suggesting right atrial pressure of 8 mmHg.  IAS/Shunts: No atrial level shunt detected by color flow Doppler.   LEFT VENTRICLE PLAX 2D LVIDd:         4.40 cm   Diastology LVIDs:         3.50 cm   LV e' lateral:   3.70 cm/s LV PW:         1.50 cm   LV E/e' lateral: 45.4 LV IVS:        1.60 cm LVOT diam:     1.80 cm LV SV:         45 LV SV Index:   23 LVOT Area:     2.54 cm   RIGHT VENTRICLE            IVC RV S prime:     9.46 cm/s  IVC diam: 2.00 cm TAPSE (M-mode): 0.7 cm  LEFT ATRIUM           Index        RIGHT  ATRIUM           Index LA diam:      4.40 cm 2.28 cm/m   RA Area:     18.40 cm LA Vol (A4C): 66.2 ml 34.32 ml/m  RA Volume:   54.80 ml  28.41 ml/m AORTIC VALVE AV Area (VTI):     0.61 cm AV Vmax:           326.49 cm/s AV Vmean:          249.200 cm/s AV VTI:            0.735 m AV Peak Grad:      42.6 mmHg AV Mean Grad:  27.5 mmHg LVOT VTI:          0.175 m LVOT/AV VTI ratio: 0.24  AORTA Ao Root diam: 2.60 cm Ao Asc diam:  3.10 cm  MITRAL VALVE MV Area (PHT): 2.51 cm     SHUNTS MV Mean grad:  5.0 mmHg     Systemic VTI:  0.18 m MV Decel Time: 302 msec     Systemic Diam: 1.80 cm MV E velocity: 168.00 cm/s  Dorn Ross MD Electronically signed by Dorn Ross MD Signature Date/Time: 04/18/2024/12:30:37 PM    Final    MONITORS  LONG TERM MONITOR (3-14 DAYS) 04/14/2024  Narrative Predominant rhythm was sinus rhythm with sinus bradycardia First degree AV block was noted Paroxysmal atrial fib noted 12% burden, with the longest run being 27 hours. Post termination pause noted lasting 3.8 seconds.   CT SCANS  CT CORONARY MORPH W/CTA COR W/SCORE 04/24/2024  Narrative CLINICAL DATA:  Aortic valve replacement (TAVR), pre-op eval Severe aortic stenosis  EXAM: Cardiac TAVR CT  TECHNIQUE: The patient was scanned on a Siemens Force 192 slice scanner. A 120 kV retrospective scan was triggered in the descending thoracic aorta at 111 HU's. Gantry rotation speed was 270 msecs and collimation was .9 mm. The 3D data set was reconstructed in 5% intervals of the R-R cycle. Systolic and diastolic phases were analyzed on a dedicated work station using MPR, MIP and VRT modes. The patient received 80mL OMNIPAQUE  IOHEXOL  350 MG/ML SOLN of contrast.  FINDINGS: Aortic Valve:  Tricuspid aortic valve with severely reduced cusp excursion. Severely thickened and severely calcified aortic valve cusps.  AV calcium score: 1633  Virtual Basal Annulus  Measurements:  Maximum/Minimum Diameter: 25.9 x 19.2 mm  Perimeter: 70.9 mm  Area:  383 mm2  Bulky calcification below right coronary cusp external to LVOT.  Membranous septal length: 11 mm  Based on these measurements, the annulus would be suitable for a 23 mm Sapien 3 valve. Alternatively, Heart Team can consider 26 mm Evolut valve. Recommend Heart Team discussion for valve selection.  Sinus of Valsalva Measurements:  Non-coronary:  30 mm  Right - coronary:  29 mm  Left - coronary:  31 mm  Coronary height and sinus of Valsalva Height:  Left main: 13.7 mm, Left sinus: 19.4 mm  Right coronary: 16.6 mm, Right sinus: 20.1 mm  Aorta: Conventional 3 vessel branch pattern of aortic arch.  Sinotubular Junction:  27 mm  Ascending Thoracic Aorta:  33 mm  Aortic Arch:  26 mm  Descending Thoracic Aorta:  24 mm  Coronary Arteries: Normal coronary origin. Right dominance. The study was performed without use of NTG and insufficient for plaque evaluation.  Optimum Fluoroscopic Angle for Delivery: LAO 12 CAU 9  OTHER:  Atria: Biatrial dilation  Left atrial appendage: Watchman device well seated in LAA, no evidence of peridevice leak, no device related thrombus.  Mitral valve:  moderate mitral annular calcifications.  Pulmonary artery: Mild dilation of main pulmonary artery, 31 mm.  Pulmonary veins: Normal anatomy.  IMPRESSION: 1. 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 Severely thickened and severely calcified aortic valve cusps. 2. Aortic valve calcium score: 1633 3. Annulus area: 383 mm2, suitable for 23 mm Sapien 3 valve. No protruding LVOT calcifications, calcifications are external to annulus. Membranous septal length 11 mm. 4. Sufficient coronary artery heights from annulus. 5. Optimum fluoroscopic angle for delivery:  LAO 12 CAU 9   Electronically Signed By: Soyla Merck M.D. On: 04/24/2024 11:40  CARDIAC MRI  MR CARDIAC  MORPHOLOGY W WO CONTRAST 03/07/2024  Narrative CLINICAL DATA:  42F with severe LVH on echo  EXAM: CARDIAC MRI  TECHNIQUE: The patient was scanned on a 1.5 Tesla Siemens magnet. A dedicated cardiac coil was used. Functional imaging was done using Fiesta sequences. 2,3, and 4 chamber views were done to assess for RWMA's. Modified Simpson's rule using a short axis stack was used to calculate an ejection fraction on a dedicated work [employer]. The patient received 10 cc of Gadavist . After 10 minutes inversion recovery sequences were used to assess for infiltration and scar tissue. Phase contrast velocity mapping was performed above the aortic and pulmonic valves  CONTRAST:  10 cc  of Gadavist   FINDINGS: Left ventricle:  -Asymmetric hypertrophy measuring up to 16mm in septum (11mm in posterior wall).  -Normal size  -Normal systolic function  -Normal ECV (27%)  -RV insertion site LGE  LV EF:  62% (Normal 52-79%)  Absolute volumes:  LV EDV: (Normal 78-167 mL)  LV ESV: 66mL (Normal 21-64 mL)  LV SV: (Normal 52-114 mL)  CO: 4.6L/min (Normal 2.7-6.3 L/min)  Indexed volumes:  LV EDV: 4mL/sq-m (Normal 50-96 mL/sq-m)  LV ESV: 30mL/sq-m (Normal 10-40 mL/sq-m)  LV SV: 97mL/sq-m (Normal 33-64 mL/sq-m)  CI: 2.3L/min/sq-m (Normal 1.9-3.9 L/min/sq-m)  Right ventricle: Normal size and systolic function  RV EF: 55% (Normal 52-80%)  Absolute volumes:  RV EDV: (Normal 79-175 mL)  RV ESV: 73mL (Normal 13-75 mL)  RV SV: 89mL (Normal 56-110 mL)  CO: 3.9L/min (Normal 2.7-6 L/min)  Indexed volumes:  RV EDV: 94mL/sq-m (Normal 51-97 mL/sq-m)  RV ESV: 61mL/sq-m (Normal 9-42 mL/sq-m)  RV SV: 56mL/sq-m (Normal 35-61 mL/sq-m)  CI: 1.9L/min/sq-m (Normal 1.8-3.8 L/min/sq-m)  Left atrium: Moderate enlargement  Right atrium: Normal size  Mitral valve: Mild regurgitation  Aortic valve: Mild regurgitation.  Visually appears severe  stenosis  Tricuspid valve: Mild regurgitation  Pulmonic valve: No regurgitation  Aorta: Normal proximal ascending aorta  Pulmonary artery: Dilated main pulmonary artery measuring 30mm  Pericardium: Normal  IMPRESSION: 1. Asymmetric LV hypertrophy measuring up to 16mm in septum (11mm in posterior wall). While this meets criteria for hypertrophic cardiomyopathy, aortic stenosis can also cause asymmetric LVH. Visually AS appears severe, and on review of recent echo appears severe AS. Suspect LVH is due to severe aortic stenosis  2.  Normal LV size and systolic function (EF 62%)  3.  Normal RV size and systolic function (55%)  4. RV insertion site late gadolinium enhancement, which is a nonspecific scar pattern often seen in setting of elevated pulmonary pressures  5.  Dilated main pulmonary artery measuring 30mm   Electronically Signed By: Lonni Nanas M.D. On: 03/08/2024 12:15   ______________________________________________________________________________________________      Laboratory Data:  High Sensitivity Troponin:   Recent Labs  Lab 04/03/24 2231 04/04/24 0105 04/04/24 0538  TROPONINIHS 13 23* 160*     Chemistry Recent Labs  Lab 04/22/24 0149 04/23/24 0305 04/24/24 1051  NA 137 138 139  K 4.2 4.0 4.1  CL 99 99 95*  CO2 23 26 31   GLUCOSE 151* 103* 225*  BUN 30* 32* 27*  CREATININE 0.96 0.91 0.45  CALCIUM 8.4* 8.5* 9.1  GFRNONAA >60 >60 >60  ANIONGAP 15 13 13     Recent Labs  Lab 04/18/24 0526 04/22/24 0149  PROT 6.3* 6.3*  ALBUMIN 4.0 3.4*  AST 15 15  ALT 14 15  ALKPHOS 96 79  BILITOT 0.8  1.5*   Hematology Recent Labs  Lab 04/19/24 0522 04/20/24 0510 04/21/24 1021 04/21/24 1032 04/22/24 0149  WBC 8.9 10.9*  --   --  10.8*  RBC 4.07 3.36*  --   --  3.88  HGB 12.3 10.1* 10.2* 11.6*  11.6* 11.6*  HCT 38.0 31.8* 30.0* 34.0*  34.0* 35.8*  MCV 93.4 94.6  --   --  92.3  MCH 30.2 30.1  --   --  29.9  MCHC 32.4 31.8  --    --  32.4  RDW 15.9* 15.9*  --   --  16.0*  PLT 235 207  --   --  258   BNP Recent Labs  Lab 04/19/24 0522  PROBNP 6,770.0*    DDimer No results for input(s): DDIMER in the last 168 hours.   Radiology/Studies:  VAS US  DOPPLER PRE CABG Result Date: 04/24/2024 PREOPERATIVE VASCULAR EVALUATION Patient Name:  Emily [name]  Date of Exam:   04/24/2024 Medical Rec #: [id number]         Accession #:    [id number] Date of Birth: 1950-12-09         Patient Gender: F Patient Age:   18 years Exam Location:  [facility] Procedure:      VAS US  DOPPLER PRE CABG Referring Phys: BAILEY SU --------------------------------------------------------------------------------  Indications:      Pre-CABG. Risk Factors:     Hypertension, Diabetes. 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 Right                   ICA stent velocity suggests 50 - 75% stenosis.                    Left Carotid: Velocities in the left ICA are consistent with a                   1-39% stenosis. The ECA appears >50% stenosed.                    Vertebrals: Right vertebral artery demonstrates antegrade                   flow. Left vertebral artery demonstrates bidirectional flow.                   Subclavians: Normal flow hemodynamics were seen in bilateral                   subclavian arteries. Performing Technologist: Gerome Ny RVT  Examination Guidelines: A complete evaluation includes B-mode imaging, spectral Doppler, color Doppler, and power Doppler as needed of all accessible portions of each vessel. Bilateral testing is considered an integral part of a complete examination. Limited examinations for reoccurring indications may be performed as noted.  Right Carotid Findings: +----------+--------+--------+--------+--------------------------+--------+           PSV cm/sEDV cm/sStenosisDescribe                  Comments +----------+--------+--------+--------+--------------------------+--------+  CCA Prox  55      7               irregular and heterogenous         +----------+--------+--------+--------+--------------------------+--------+ CCA Distal57      12              smooth and heterogenous            +----------+--------+--------+--------+--------------------------+--------+ ICA Prox  153  17              calcific                           +----------+--------+--------+--------+--------------------------+--------+ ICA Mid   142     32              smooth and heterogenous            +----------+--------+--------+--------+--------------------------+--------+ ICA Distal120     30                                                 +----------+--------+--------+--------+--------------------------+--------+ ECA       108     1               calcific                           +----------+--------+--------+--------+--------------------------+--------+ +----------+--------+-------+--------+------------+           PSV cm/sEDV cmsDescribeArm Pressure +----------+--------+-------+--------+------------+ Subclavian157                                 +----------+--------+-------+--------+------------+ +---------+--------+--+--------+--+---------+ VertebralPSV cm/s51EDV cm/s15Antegrade +---------+--------+--+--------+--+---------+ Left Carotid Findings: +----------+--------+--------+--------+--------------------------+--------+           PSV cm/sEDV cm/sStenosisDescribe                  Comments +----------+--------+--------+--------+--------------------------+--------+ CCA Prox  80      15              irregular and heterogenous         +----------+--------+--------+--------+--------------------------+--------+ CCA Distal65      9               irregular and heterogenous         +----------+--------+--------+--------+--------------------------+--------+ ICA Prox  94      22              calcific                            +----------+--------+--------+--------+--------------------------+--------+ ICA Mid   45      11                                        tortuous +----------+--------+--------+--------+--------------------------+--------+ ICA Distal66      18                                        tortuous +----------+--------+--------+--------+--------------------------+--------+ ECA       310     0                                                  +----------+--------+--------+--------+--------------------------+--------+ +----------+--------+--------+--------+------------+ SubclavianPSV cm/sEDV cm/sDescribeArm Pressure +----------+--------+--------+--------+------------+           150                                  +----------+--------+--------+--------+------------+ +---------+--------+--+--------+-+--------+  VertebralPSV cm/s15EDV cm/s3Atypical +---------+--------+--+--------+-+--------+  ABI Findings: +------------------+-----+----------+ Rt Pressure (mmHg)IndexWaveform   +------------------+-----+----------+ 160                    triphasic  +------------------+-----+----------+ 124               0.78 monophasic +------------------+-----+----------+ 117               0.73 monophasic +------------------+-----+----------+ +------------------+-----+----------+ Lt Pressure (mmHg)IndexWaveform   +------------------+-----+----------+ 157                    triphasic  +------------------+-----+----------+ 254               1.59 monophasic +------------------+-----+----------+ 113               0.71 monophasic +------------------+-----+----------+ +-------+---------------+ ABI/TBIToday's ABI/TBI +-------+---------------+ Right  0.78            +-------+---------------+ Left   0.71            +-------+---------------+  Right Doppler Findings: +--------+--------+---------+ Site    PressureDoppler   +--------+--------+---------+ Brachial160      triphasic +--------+--------+---------+ Radial          triphasic +--------+--------+---------+ Ulnar           triphasic +--------+--------+---------+  Left Doppler Findings: +--------+--------+---------+ Site    PressureDoppler   +--------+--------+---------+ [id number]     triphasic +--------+--------+---------+ Radial          triphasic +--------+--------+---------+ Ulnar           triphasic +--------+--------+---------+  Summary: Right Carotid: Velocities detected in the stented right ICA suggest a 50 - 75%                stenosis. Left Carotid: Velocities in the left ICA are consistent with a 1-39% stenosis. Vertebrals: Right vertebral artery demonstrates antegrade flow. Left vertebral             artery demonstrates bidirectional flow. Right ABI: Resting right ankle-brachial index indicates moderate right lower extremity arterial disease. Left ABI: Resting left ankle-brachial index indicates moderate left lower extremity arterial disease. Right Upper Extremity: Doppler waveforms remain within normal limits with right radial compression. Doppler waveforms remain within normal limits with right ulnar compression. Left Upper Extremity: Doppler waveforms decrease >50% with left radial compression. Doppler waveforms remain within normal limits with left ulnar compression.     Preliminary    CT CORONARY MORPH W/CTA COR W/SCORE W/CA W/CM &/OR WO/CM Result Date: 04/24/2024 CLINICAL DATA:  Aortic valve replacement (TAVR), pre-op eval Severe aortic stenosis EXAM: Cardiac TAVR CT TECHNIQUE: The patient was scanned on a Siemens Force 192 slice scanner. A 120 kV retrospective scan was triggered in the descending thoracic aorta at 111 HU's. Gantry rotation speed was 270 msecs and collimation was .9 mm. The 3D data set was reconstructed in 5% intervals of the R-R cycle. Systolic and diastolic phases were analyzed on a dedicated work station using MPR, MIP and VRT modes. The patient received  80mL OMNIPAQUE  IOHEXOL  350 MG/ML SOLN of contrast. 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 Severely thickened and severely calcified aortic valve cusps. AV calcium score: 1633 Virtual Basal Annulus Measurements: Maximum/Minimum Diameter: 25.9 x 19.2 mm Perimeter: 70.9 mm Area:  383 mm2 Bulky calcification below right coronary cusp external to LVOT. Membranous septal length: 11 mm Based on these measurements, the annulus would be suitable for a 23 mm Sapien 3 valve. Alternatively, Heart Team can consider 26 mm Evolut  valve. Recommend Heart Team discussion for valve selection. Sinus of Valsalva Measurements: Non-coronary:  30 mm Right - coronary:  29 mm Left - coronary:  31 mm Coronary height and sinus of Valsalva Height: Left main: 13.7 mm, Left sinus: 19.4 mm Right coronary: 16.6 mm, Right sinus: 20.1 mm Aorta: Conventional 3 vessel branch pattern of aortic arch. Sinotubular Junction:  27 mm Ascending Thoracic Aorta:  33 mm Aortic Arch:  26 mm Descending Thoracic Aorta:  24 mm Coronary Arteries: Normal coronary origin. Right dominance. The study was performed without use of NTG and insufficient for plaque evaluation. Optimum Fluoroscopic Angle for Delivery: LAO 12 CAU 9 OTHER: Atria: Biatrial dilation Left atrial appendage: Watchman device well seated in LAA, no evidence of peridevice leak, no device related thrombus. Mitral valve:  moderate mitral annular calcifications. Pulmonary artery: Mild dilation of main pulmonary artery, 31 mm. Pulmonary veins: Normal anatomy. IMPRESSION: 1. 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 Severely thickened and severely calcified aortic valve cusps. 2. Aortic valve calcium score: 1633 3. Annulus area: 383 mm2, suitable for 23 mm Sapien 3 valve. No protruding LVOT calcifications, calcifications are external to annulus. Membranous septal length 11 mm. 4. Sufficient coronary artery heights from annulus. 5. Optimum  fluoroscopic angle for delivery:  LAO 12 CAU 9 Electronically Signed   By: Soyla Merck M.D.   On: 04/24/2024 11:40   CARDIAC CATHETERIZATION Result Date: 04/21/2024   Prox RCA-1 lesion is 90% stenosed.   Prox RCA-2 lesion is 90% stenosed.   Mid RCA lesion is 90% stenosed.   RPDA lesion is 40% stenosed.   1st Mrg lesion is 95% stenosed.   Prox LAD to Mid LAD lesion is 50% stenosed.   Mid LAD lesion is 80% stenosed. Severe three vessel CAD Moderate calcified stenosis throughout the proximal LAD. Severe mid LAD stenosis Severe calcified stenosis in the large caliber obtuse marginal branch Dominant RCA with severe calcified lesion in the proximal and mid vessel. Elevated right heart pressures with wedge pressure 30-35 mmHg Recommendations: Emily need to consider CABG/AVR given multi-vessel CAD and severe AS. 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 Currently with uncontrolled atrial fib on amiodarone  and volume overload. Emily hold Plavix  and ask CT surgery to see her.     STS Risk Calculator:      Assessment and Plan:   Emily [name] is a 73 y.o. female with symptoms of severe, stage *** aortic stenosis with NYHA Class *** symptoms. I have reviewed the patient's recent echocardiogram which is notable for *** LV systolic function and *** aortic stenosis with peak gradient of *** and mean transvalvular gradient of ***. The patient's dimensionless index is *** and calculated aortic valve area is *** cm.    I have reviewed the natural history of aortic stenosis with the patient. We have discussed the limitations of medical therapy and the poor prognosis associated with symptomatic aortic stenosis. We have reviewed potential treatment options, including palliative medical therapy, conventional surgical aortic valve replacement, and transcatheter aortic valve replacement. We discussed treatment options in the context of this patient's specific comorbid medical conditions.     The patient's predicted risk of mortality with conventional aortic valve replacement is *** primarily based on ***. Other significant comorbid conditions include ***. TAVR seems like a reasonable treatment option for this patient pending formal cardiac surgical consultation. We discussed typical evaluation which Emily require a gated cardiac CTA and a CTA of the chest/abdomen/pelvis to evaluate  both his cardiac anatomy and peripheral vasculature. Follow-up testing Emily be arranged as an outpatient. ***    {Complete the following score calculators/questions which help meet required metrics.  Press F2         :[id number]}     For questions or updates, please contact [facility] Please consult [email or web address] for contact info under    Signed, Lamarr Hummer, PA-C  04/24/2024 3:01 PM

## 2024-04-25 ENCOUNTER — Other Ambulatory Visit (HOSPITAL_COMMUNITY): Payer: Self-pay

## 2024-04-25 ENCOUNTER — Ambulatory Visit: Payer: Self-pay

## 2024-04-25 ENCOUNTER — Encounter (HOSPITAL_COMMUNITY): Payer: Self-pay | Admitting: Oncology

## 2024-04-25 DIAGNOSIS — I480 Paroxysmal atrial fibrillation: Secondary | ICD-10-CM | POA: Diagnosis not present

## 2024-04-25 LAB — BASIC METABOLIC PANEL WITH GFR
Anion gap: 10 (ref 5–15)
BUN: 30 mg/dL — ABNORMAL HIGH (ref 8–23)
CO2: 27 mmol/L (ref 22–32)
Calcium: 8.5 mg/dL — ABNORMAL LOW (ref 8.9–10.3)
Chloride: 102 mmol/L (ref 98–111)
Creatinine, Ser: 0.87 mg/dL (ref 0.44–1.00)
GFR, Estimated: >60 mL/min (ref >60)
Glucose, Bld: 137 mg/dL — ABNORMAL HIGH (ref 70–99)
Potassium: 3.6 mmol/L (ref 3.5–5.1)
Sodium: 139 mmol/L (ref 135–145)

## 2024-04-25 LAB — GLUCOSE, CAPILLARY: Glucose-Capillary: 139 mg/dL — ABNORMAL HIGH (ref 70–99)

## 2024-04-25 MED ORDER — FUROSEMIDE 40 MG PO TABS
40.0000 mg | ORAL_TABLET | Freq: Every day | ORAL | 0 refills | Status: AC
  Filled 2024-04-25: qty 90, 90d supply, fill #0

## 2024-04-25 MED ORDER — METOPROLOL SUCCINATE ER 100 MG PO TB24
100.0000 mg | ORAL_TABLET | Freq: Every day | ORAL | 0 refills | Status: DC
  Filled 2024-04-25: qty 90, 90d supply, fill #0

## 2024-04-25 MED ORDER — AMIODARONE HCL 200 MG PO TABS
ORAL_TABLET | ORAL | 0 refills | Status: DC
  Filled 2024-04-25: qty 196, 94d supply, fill #0
  Filled 2024-04-25: qty 20, 6d supply, fill #0

## 2024-04-25 MED ORDER — PRAVASTATIN SODIUM 40 MG PO TABS
40.0000 mg | ORAL_TABLET | Freq: Every day | ORAL | 0 refills | Status: DC
  Filled 2024-04-25: qty 90, 90d supply, fill #0

## 2024-04-25 MED ORDER — SPIRONOLACTONE 25 MG PO TABS
25.0000 mg | ORAL_TABLET | Freq: Every day | ORAL | 0 refills | Status: AC
  Filled 2024-04-25: qty 90, 90d supply, fill #0

## 2024-04-25 MED ORDER — CLOPIDOGREL BISULFATE 75 MG PO TABS
75.0000 mg | ORAL_TABLET | Freq: Every day | ORAL | Status: DC
  Administered 2024-04-25: 75 mg via ORAL
  Filled 2024-04-25: qty 1

## 2024-04-25 MED ORDER — ENSURE PLUS HIGH PROTEIN PO LIQD: 237.0000 mL | Freq: Two times a day (BID) | ORAL | Status: DC

## 2024-04-25 MED ORDER — LOSARTAN POTASSIUM 25 MG PO TABS
25.0000 mg | ORAL_TABLET | Freq: Every day | ORAL | Status: DC
  Administered 2024-04-25: 25 mg via ORAL
  Filled 2024-04-25: qty 1

## 2024-04-25 NOTE — Discharge Summary (Signed)
 Physician Discharge Summary  Emily [name] FMW:[id number] DOB: December 11, 1950 DOA: 04/17/2024  PCP: Jolinda Norene HERO, DO  Admit date: 04/17/2024 Discharge date: 04/25/2024  Admitted From: home Disposition:  home  Recommendations for Outpatient Follow-up:  Follow up with PCP in 1-2 weeks Follow-up with cardiology as an outpatient, she will see EP, lipid clinic and general cardiology  Home Health: none Equipment/Devices: none  Discharge Condition: stable CODE STATUS: Full code Diet Orders (From admission, onward)     Start     Ordered   04/22/24 1202  Diet heart healthy/carb modified Room service appropriate? Yes; Fluid consistency: Thin  Diet effective now       Question Answer Comment  Diet-HS Snack? Nothing   Room service appropriate? Yes   Fluid consistency: Thin      04/22/24 1202           Brief Narrative / Interim history: 73 year old female with history of HTN, HLD, DM2, chronic A-fib with Watchman device in place, hypothyroidism, severe AAS, DM2 comes into the hospital with left lower extremity edema.  She was found to be in A-fib with RVR and cardiology was consulted, also fluid overloaded and is being diuresed.  She was initially admitted [facility], transferred to [facility] for structural heart team evaluation for her aortic stenosis, as well as left and right heart cath  Hospital Course / Discharge diagnoses: Principal problem A-fib/flutter with RVR - patient was found to be in A-fib with RVR on admission.  Cardiology consulted and followed patient while hospitalized.  She was placed on a combination of high-dose amiodarone  as well as metoprolol , and eventually converted to sinus rhythm.  She has remained in sinus, discussed with cardiology, will be discharged home in stable condition on metoprolol  XL along with amiodarone  400 mg BID up until 11/28 and 200 mg BID starting 11/29.  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  Cardiothoracic surgery consulted and evaluated patient while hospitalized.  She was considered for CABG/AVR, however currently medical management has been recommended.  She will have outpatient follow-up with cardiology for PCI/stenting, followed by TAVR planning by structural team Acute on chronic diastolic CHF-found to be fluid overloaded on admission, has received IV diuresis as per cardiology.  She is now euvolemic, continue furosemide  along with spironolactone  upon discharge Severe AS-once she underwent PCI for her CAD, will be followed by cardiac structural team as an outpatient for potential TAVR  DM 2, with hyperglycemia-continue home medications Hypothyroidism-continue Synthroid .  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  She would benefit from weight loss  Sepsis ruled out   Discharge Instructions   Allergies as of 04/25/2024       Reactions   Statins Other (See Comments), Cough   Not all statins but some cause cough and pain in legs.   Jardiance  [empagliflozin ] Other (See Comments)   Recurrent vaginitis   Quinapril Hcl Cough   Tape Other (See Comments)   Redness, please use paper tape        Medication List     STOP taking these medications    diltiazem  240 MG 24 hr capsule Commonly known as: Cartia  XT   diltiazem  30 MG tablet Commonly known as: CARDIZEM        TAKE these medications    amiodarone  200 MG tablet Commonly known as: PACERONE  Take 2  tablets (400 mg total) by mouth 2 (two) times daily for 4 days, THEN 1 tablet (200 mg total) 2 (two) times daily. Until 11/28, and on 11/29 take 200 mg twice daily. Start taking on: April 25, 2024 What changed: See the new instructions.   clopidogrel  75 MG tablet Commonly known as: PLAVIX  TAKE 1 TABLET BY MOUTH EVERY DAY    diphenhydramine -acetaminophen  25-500 MG Tabs tablet Commonly known as: TYLENOL  PM Take 1 tablet by mouth at bedtime as needed (pain).   ezetimibe  10 MG tablet Commonly known as: ZETIA  TAKE 1 TABLET BY MOUTH EVERY DAY   fluticasone  50 MCG/ACT nasal spray Commonly known as: FLONASE  Place 2 sprays into both nostrils daily.   furosemide  40 MG tablet Commonly known as: LASIX  Take 1 tablet (40 mg total) by mouth daily. Start taking on: April 26, 2024 What changed:  medication strength how much to take when to take this reasons to take this   gabapentin  300 MG capsule Commonly known as: NEURONTIN  TAKE 1 CAPSULE (300 MG TOTAL) BY MOUTH 2 (TWO) TIMES DAILY AS NEEDED (NEUROPATHY RIGHT LEG). What changed: when to take this   levonorgestrel 20 MCG/DAY Iud Commonly known as: MIRENA 1 each by Intrauterine route once.   levothyroxine  112 MCG tablet Commonly known as: SYNTHROID  TAKE 1 TABLET BY MOUTH EVERY OTHER DAY, ALTERNATING WITH 1 & 1/2 TABLETS EVERY OTHER DAY What changed: See the new instructions.   linaclotide  290 MCG Caps capsule Commonly known as: Linzess  Take 1 capsule (290 mcg total) by mouth daily before breakfast.   losartan  25 MG tablet Commonly known as: COZAAR  Take 1 tablet (25 mg total) by mouth daily.   metFORMIN  1000 MG tablet Commonly known as: GLUCOPHAGE  Take 1 tablet (1,000 mg total) by mouth 2 (two) times daily with a meal. What changed: how much to take   metoprolol  succinate 100 MG 24 hr tablet Commonly known as: TOPROL -XL Take 1 tablet (100 mg total) by mouth daily. Take with or immediately following a meal. Start taking on: April 26, 2024 What changed:  medication strength how much to take when to take this additional instructions   polyethylene glycol 17 g packet Commonly known as: MIRALAX  / GLYCOLAX  Take 17 g by mouth daily as needed for moderate constipation.   pravastatin  40 MG tablet Commonly known as: PRAVACHOL  Take 1 tablet  (40 mg total) by mouth daily at 6 PM.   rOPINIRole  0.5 MG tablet Commonly known as: REQUIP  TAKE 1 TABLET BY MOUTH AT BEDTIME.   Semaglutide (0.25 or 0.5MG /DOS) 2 MG/3ML Sopn Inject 0.5 mg into the skin once a week. What changed: when to take this   spironolactone  25 MG tablet Commonly known as: ALDACTONE  Take 1 tablet (25 mg total) by mouth daily. Start taking on: April 26, 2024        Follow-up Information     Jolinda Potter M, DO Follow up in 1 week(s).   Specialty: Family Medicine Contact information: [address] [place] [address] [phone]                Consultations: Cardiology  Procedures/Studies:  CT Angio Abd/Pel w/ and/or w/o Result Date: 04/25/2024 EXAM: CTA CHEST, ABDOMEN AND PELVIS WITHOUT AND WITH CONTRAST 04/24/2024 09:08:38 AM TECHNIQUE: CTA of the chest was performed with the administration of intravenous contrast. CTA of the abdomen and pelvis was performed without and with the administration of intravenous contrast. Multiplanar reformatted images are provided for review. MIP images are provided for review. Automated  exposure control, iterative reconstruction, and/or weight based adjustment of the mA/kV was utilized to reduce the radiation dose to as low as reasonably achievable. COMPARISON: 05/26/2014 CLINICAL HISTORY: Aortic valve replacement (TAVR), pre-op eval. FINDINGS: VASCULATURE: AORTA: Moderate scattered calcified plaque throughout the thoracic aorta. Extensive calcified plaque through the abdominal aorta without an aneurysm. No dissection. PULMONARY ARTERIES: No pulmonary embolism with the limits of this exam. GREAT VESSELS OF AORTIC ARCH: 3 vessel brachiocephalic arterial origin anatomy without proximal stenosis. No dissection. No arterial occlusion or significant stenosis. CELIAC TRUNK: osteal calcified plaque without high grade stenosis. No occlusion or significant stenosis. SUPERIOR MESENTERIC ARTERY: origin calcified plaque  extending over the length of at least 3 cm resulting in at least moderate stenosis, atheromatous but patent distally. INFERIOR MESENTERIC ARTERY: Calcified plaque at the origin which remains patent. No occlusion or significant stenosis. RENAL ARTERIES: Single left renal artery with fairly heavily calcified ostial plaque resulting in stenosis of at least moderate severity. Duplicated right renal arteries, superior dominant with heavily calcified ostial plaque resulting in stenosis of at least moderate severity. ILIAC ARTERIES: Scattered calcified plaque throughout bilateral common and internal iliac arteries without high grade stenosis or aneurysm. Mild tortuosity of the iliac arterial system bilaterally. Heavily calcified plaque in the right common femoral artery resulting in short segment stenosis of mild to moderate severity. Heavily calcified plaque in the proximal visualized aspect of bilateral superficial femoral arteries. CHEST: MEDIASTINUM: Occluder device in the left atrial appendage. Mitral annulus calcifications. Coronary leaflet calcifications. Extensive 3-vessel coronary calcifications. No mediastinal lymphadenopathy. The heart and pericardium demonstrate no acute abnormality. LUNGS AND PLEURA: Small left and moderate right pleural effusions, new since previous. Dependent atelectasis/consolidation posteriorly in the lung bases. No pneumothorax. THORACIC BONES AND SOFT TISSUES: No acute bone or soft tissue abnormality. ABDOMEN AND PELVIS: LIVER: Mildly nodular hepatic contour suggesting cirrhosis. GALLBLADDER AND BILE DUCTS: Gallbladder is unremarkable. No biliary ductal dilatation. SPLEEN: The spleen is unremarkable. PANCREAS: The pancreas is unremarkable. ADRENAL GLANDS: Bilateral adrenal glands demonstrate no acute abnormality. KIDNEYS, URETERS AND BLADDER: No stones in the kidneys or ureters. No hydronephrosis. No perinephric or periureteral stranding. Urinary bladder is unremarkable. GI AND BOWEL:  Stomach and duodenal sweep demonstrate no acute abnormality. There is no bowel obstruction. No abnormal bowel wall thickening or distension. REPRODUCTIVE: Reproductive organs are unremarkable. PERITONEUM AND RETROPERITONEUM: No ascites or free air. LYMPH NODES: No lymphadenopathy. ABDOMINAL BONES AND SOFT TISSUES: multilevel spondylotic change through the lumbar spine. Mild L1 vertebral compression deformity with less than 20% loss of height, age indeterminate. Mild grade 1 anterolisthesis L4-L5 probably related to bilateral facet DJD. No acute soft tissue abnormality. IMPRESSION: 1. SMA origin calcified plaque extending over at least 3 cm resulting in at least moderate stenosis, atheromatous but patent distally. 2. Single left renal artery with heavily calcified plaque resulting in at least moderate stenosis. 3. Duplicated right renal arteries, superior dominant, with heavily calcified plaque resulting in at least moderate stenosis. 4. Heavily calcified plaque in the right common femoral artery causing a short segment stenosis of mild to moderate severity. 5. Heavily calcified plaque in the proximal superficial femoral arteries bilaterally. Correlate with ABIs. Electronically signed by: Katheleen Faes MD 04/25/2024 09:19 AM EST RP Workstation: [id number]   CT ANGIO CHEST AORTA W/CM & OR WO/CM Result Date: 04/25/2024 EXAM: CTA CHEST, ABDOMEN AND PELVIS WITHOUT AND WITH CONTRAST 04/24/2024 09:08:38 AM TECHNIQUE: CTA of the chest was performed with the administration of intravenous contrast. CTA of the abdomen and pelvis was performed  without and with the administration of intravenous contrast. Multiplanar reformatted images are provided for review. MIP images are provided for review. Automated exposure control, iterative reconstruction, and/or weight based adjustment of the mA/kV was utilized to reduce the radiation dose to as low as reasonably achievable. COMPARISON: 05/26/2014 CLINICAL HISTORY: Aortic valve  replacement (TAVR), pre-op eval. FINDINGS: VASCULATURE: AORTA: Moderate scattered calcified plaque throughout the thoracic aorta. Extensive calcified plaque through the abdominal aorta without an aneurysm. No dissection. PULMONARY ARTERIES: No pulmonary embolism with the limits of this exam. GREAT VESSELS OF AORTIC ARCH: 3 vessel brachiocephalic arterial origin anatomy without proximal stenosis. No dissection. No arterial occlusion or significant stenosis. CELIAC TRUNK: osteal calcified plaque without high grade stenosis. No occlusion or significant stenosis. SUPERIOR MESENTERIC ARTERY: origin calcified plaque extending over the length of at least 3 cm resulting in at least moderate stenosis, atheromatous but patent distally. INFERIOR MESENTERIC ARTERY: Calcified plaque at the origin which remains patent. No occlusion or significant stenosis. RENAL ARTERIES: Single left renal artery with fairly heavily calcified ostial plaque resulting in stenosis of at least moderate severity. Duplicated right renal arteries, superior dominant with heavily calcified ostial plaque resulting in stenosis of at least moderate severity. ILIAC ARTERIES: Scattered calcified plaque throughout bilateral common and internal iliac arteries without high grade stenosis or aneurysm. Mild tortuosity of the iliac arterial system bilaterally. Heavily calcified plaque in the right common femoral artery resulting in short segment stenosis of mild to moderate severity. Heavily calcified plaque in the proximal visualized aspect of bilateral superficial femoral arteries. CHEST: MEDIASTINUM: Occluder device in the left atrial appendage. Mitral annulus calcifications. Coronary leaflet calcifications. Extensive 3-vessel coronary calcifications. No mediastinal lymphadenopathy. The heart and pericardium demonstrate no acute abnormality. LUNGS AND PLEURA: Small left and moderate right pleural effusions, new since previous. Dependent atelectasis/consolidation  posteriorly in the lung bases. No pneumothorax. THORACIC BONES AND SOFT TISSUES: No acute bone or soft tissue abnormality. ABDOMEN AND PELVIS: LIVER: Mildly nodular hepatic contour suggesting cirrhosis. GALLBLADDER AND BILE DUCTS: Gallbladder is unremarkable. No biliary ductal dilatation. SPLEEN: The spleen is unremarkable. PANCREAS: The pancreas is unremarkable. ADRENAL GLANDS: Bilateral adrenal glands demonstrate no acute abnormality. KIDNEYS, URETERS AND BLADDER: No stones in the kidneys or ureters. No hydronephrosis. No perinephric or periureteral stranding. Urinary bladder is unremarkable. GI AND BOWEL: Stomach and duodenal sweep demonstrate no acute abnormality. There is no bowel obstruction. No abnormal bowel wall thickening or distension. REPRODUCTIVE: Reproductive organs are unremarkable. PERITONEUM AND RETROPERITONEUM: No ascites or free air. LYMPH NODES: No lymphadenopathy. ABDOMINAL BONES AND SOFT TISSUES: multilevel spondylotic change through the lumbar spine. Mild L1 vertebral compression deformity with less than 20% loss of height, age indeterminate. Mild grade 1 anterolisthesis L4-L5 probably related to bilateral facet DJD. No acute soft tissue abnormality. IMPRESSION: 1. SMA origin calcified plaque extending over at least 3 cm resulting in at least moderate stenosis, atheromatous but patent distally. 2. Single left renal artery with heavily calcified plaque resulting in at least moderate stenosis. 3. Duplicated right renal arteries, superior dominant, with heavily calcified plaque resulting in at least moderate stenosis. 4. Heavily calcified plaque in the right common femoral artery causing a short segment stenosis of mild to moderate severity. 5. Heavily calcified plaque in the proximal superficial femoral arteries bilaterally. Correlate with ABIs. Electronically signed by: Katheleen Faes MD 04/25/2024 09:19 AM EST RP Workstation: [id number]   CT CORONARY MORPH W/CTA COR W/SCORE W/CA W/CM &/OR  WO/CM Addendum Date: 04/24/2024 ADDENDUM REPORT: 04/24/2024 20:46 EXAM: OVER-READ INTERPRETATION  CT  CHEST The following report is an over-read performed by radiologist Dr. Oneil Devonshire of [facility] on 04/24/2024. This over-read does not include interpretation of cardiac or coronary anatomy or pathology. The coronary calcium score/coronary CTA interpretation by the cardiologist is attached. COMPARISON:  None. FINDINGS: Cardiovascular: Extensive calcifications of the thoracic aorta are noted. No aneurysmal dilatation is seen. No pulmonary emboli are seen. Mediastinum/Nodes: There are no enlarged lymph nodes within the visualized mediastinum. Lungs/Pleura: Bilateral pleural effusions are noted right greater than left. Basilar atelectatic changes are seen. Upper abdomen: No significant findings in the visualized upper abdomen. Musculoskeletal/Chest wall: No chest wall mass or suspicious osseous findings within the visualized chest. IMPRESSION: Aortic Atherosclerosis (ICD10-I70.0). Bilateral pleural effusions right greater than left with associated atelectatic changes. Electronically Signed   By: Oneil Devonshire M.D.   On: 04/24/2024 20:46   Result Date: 04/24/2024 CLINICAL DATA:  Aortic valve replacement (TAVR), pre-op eval Severe aortic stenosis EXAM: Cardiac TAVR CT TECHNIQUE: The patient was scanned on a Siemens Force 192 slice scanner. A 120 kV retrospective scan was triggered in the descending thoracic aorta at 111 HU's. Gantry rotation speed was 270 msecs and collimation was .9 mm. The 3D data set was reconstructed in 5% intervals of the R-R cycle. Systolic and diastolic phases were analyzed on a dedicated work station using MPR, MIP and VRT modes. The patient received 80mL OMNIPAQUE  IOHEXOL  350 MG/ML SOLN of contrast. 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 Severely thickened and severely calcified aortic valve cusps. AV calcium score: 1633 Virtual  Basal Annulus Measurements: Maximum/Minimum Diameter: 25.9 x 19.2 mm Perimeter: 70.9 mm Area:  383 mm2 Bulky calcification below right coronary cusp external to LVOT. Membranous septal length: 11 mm Based on these measurements, the annulus would be suitable for a 23 mm Sapien 3 valve. Alternatively, Heart Team can consider 26 mm Evolut valve. Recommend Heart Team discussion for valve selection. Sinus of Valsalva Measurements: Non-coronary:  30 mm Right - coronary:  29 mm Left - coronary:  31 mm Coronary height and sinus of Valsalva Height: Left main: 13.7 mm, Left sinus: 19.4 mm Right coronary: 16.6 mm, Right sinus: 20.1 mm Aorta: Conventional 3 vessel branch pattern of aortic arch. Sinotubular Junction:  27 mm Ascending Thoracic Aorta:  33 mm Aortic Arch:  26 mm Descending Thoracic Aorta:  24 mm Coronary Arteries: Normal coronary origin. Right dominance. The study was performed without use of NTG and insufficient for plaque evaluation. Optimum Fluoroscopic Angle for Delivery: LAO 12 CAU 9 OTHER: Atria: Biatrial dilation Left atrial appendage: Watchman device well seated in LAA, no evidence of peridevice leak, no device related thrombus. Mitral valve:  moderate mitral annular calcifications. Pulmonary artery: Mild dilation of main pulmonary artery, 31 mm. Pulmonary veins: Normal anatomy. IMPRESSION: 1. 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 Severely thickened and severely calcified aortic valve cusps. 2. Aortic valve calcium score: 1633 3. Annulus area: 383 mm2, suitable for 23 mm Sapien 3 valve. No protruding LVOT calcifications, calcifications are external to annulus. Membranous septal length 11 mm. 4. Sufficient coronary artery heights from annulus. 5. Optimum fluoroscopic angle for delivery:  LAO 12 CAU 9 Electronically Signed: By: Soyla Merck M.D. On: 04/24/2024 11:40   VAS US  DOPPLER PRE CABG Result Date: 04/24/2024 PREOPERATIVE VASCULAR EVALUATION Patient Name:  Emily [name]   Date of Exam:   04/24/2024 Medical Rec #: [id number]         Accession #:    [id number]  Date of Birth: 23-Aug-1950         Patient Gender: F Patient Age:   12 years Exam Location:  [facility] Procedure:      VAS US  DOPPLER PRE CABG Referring Phys: BAILEY SU --------------------------------------------------------------------------------  Indications:      Pre-CABG. Risk Factors:     Hypertension, Diabetes. 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 Right                   ICA stent velocity suggests 50 - 75% stenosis.                    Left Carotid: Velocities in the left ICA are consistent with a                   1-39% stenosis. The ECA appears >50% stenosed.                    Vertebrals: Right vertebral artery demonstrates antegrade                   flow. Left vertebral artery demonstrates bidirectional flow.                   Subclavians: Normal flow hemodynamics were seen in bilateral                   subclavian arteries. Performing Technologist: Gerome Ny RVT  Examination Guidelines: A complete evaluation includes B-mode imaging, spectral Doppler, color Doppler, and power Doppler as needed of all accessible portions of each vessel. Bilateral testing is considered an integral part of a complete examination. Limited examinations for reoccurring indications may be performed as noted.  Right Carotid Findings: +----------+--------+--------+--------+--------------------------+--------+           PSV cm/sEDV cm/sStenosisDescribe                  Comments +----------+--------+--------+--------+--------------------------+--------+ CCA Prox  55      7               irregular and heterogenous         +----------+--------+--------+--------+--------------------------+--------+ CCA Distal57      12              smooth and heterogenous            +----------+--------+--------+--------+--------------------------+--------+ ICA Prox  153     17               calcific                           +----------+--------+--------+--------+--------------------------+--------+ ICA Mid   142     32              smooth and heterogenous            +----------+--------+--------+--------+--------------------------+--------+ ICA Distal120     30                                                 +----------+--------+--------+--------+--------------------------+--------+ ECA       108     1               calcific                           +----------+--------+--------+--------+--------------------------+--------+ +----------+--------+-------+--------+------------+  PSV cm/sEDV cmsDescribeArm Pressure +----------+--------+-------+--------+------------+ Subclavian157                                 +----------+--------+-------+--------+------------+ +---------+--------+--+--------+--+---------+ VertebralPSV cm/s51EDV cm/s15Antegrade +---------+--------+--+--------+--+---------+ Left Carotid Findings: +----------+--------+--------+--------+--------------------------+--------+           PSV cm/sEDV cm/sStenosisDescribe                  Comments +----------+--------+--------+--------+--------------------------+--------+ CCA Prox  80      15              irregular and heterogenous         +----------+--------+--------+--------+--------------------------+--------+ CCA Distal65      9               irregular and heterogenous         +----------+--------+--------+--------+--------------------------+--------+ ICA Prox  94      22              calcific                           +----------+--------+--------+--------+--------------------------+--------+ ICA Mid   45      11                                        tortuous +----------+--------+--------+--------+--------------------------+--------+ ICA Distal66      18                                        tortuous  +----------+--------+--------+--------+--------------------------+--------+ ECA       310     0                                                  +----------+--------+--------+--------+--------------------------+--------+ +----------+--------+--------+--------+------------+ SubclavianPSV cm/sEDV cm/sDescribeArm Pressure +----------+--------+--------+--------+------------+           150                                  +----------+--------+--------+--------+------------+ +---------+--------+--+--------+-+--------+ VertebralPSV cm/s15EDV cm/s3Atypical +---------+--------+--+--------+-+--------+  ABI Findings: +------------------+-----+----------+ Rt Pressure (mmHg)IndexWaveform   +------------------+-----+----------+ 160                    triphasic  +------------------+-----+----------+ 124               0.78 monophasic +------------------+-----+----------+ 117               0.73 monophasic +------------------+-----+----------+ +------------------+-----+----------+ Lt Pressure (mmHg)IndexWaveform   +------------------+-----+----------+ 157                    triphasic  +------------------+-----+----------+ 254               1.59 monophasic +------------------+-----+----------+ 113               0.71 monophasic +------------------+-----+----------+ +-------+---------------+ ABI/TBIToday's ABI/TBI +-------+---------------+ Right  0.78            +-------+---------------+ Left   0.71            +-------+---------------+  Right Doppler Findings: +--------+--------+---------+ Site  PressureDoppler   +--------+--------+---------+ [id number]     triphasic +--------+--------+---------+ Radial          triphasic +--------+--------+---------+ Ulnar           triphasic +--------+--------+---------+  Left Doppler Findings: +--------+--------+---------+ Site    PressureDoppler   +--------+--------+---------+ [id number]     triphasic  +--------+--------+---------+ Radial          triphasic +--------+--------+---------+ Ulnar           triphasic +--------+--------+---------+   Summary: Right Carotid: Velocities detected in the stented right ICA suggest a 50 - 75%                stenosis. Left Carotid: Velocities in the left ICA are consistent with a 1-39% stenosis. Vertebrals: Right vertebral artery demonstrates antegrade flow. Left vertebral             artery demonstrates bidirectional flow. Right ABI: Resting right ankle-brachial index indicates moderate right lower extremity arterial disease. Left ABI: Resting left ankle-brachial index indicates moderate left lower extremity arterial disease. Right Upper Extremity: Doppler waveforms remain within normal limits with right radial compression. Doppler waveforms remain within normal limits with right ulnar compression. Left Upper Extremity: Doppler waveforms decrease >50% with left radial compression. Doppler waveforms remain within normal limits with left ulnar compression.  Electronically signed by Lonni Gaskins MD on 04/24/2024 at 3:16:10 PM.    Final    CARDIAC CATHETERIZATION Result Date: 04/21/2024   Prox RCA-1 lesion is 90% stenosed.   Prox RCA-2 lesion is 90% stenosed.   Mid RCA lesion is 90% stenosed.   RPDA lesion is 40% stenosed.   1st Mrg lesion is 95% stenosed.   Prox LAD to Mid LAD lesion is 50% stenosed.   Mid LAD lesion is 80% stenosed. Severe three vessel CAD Moderate calcified stenosis throughout the proximal LAD. Severe mid LAD stenosis Severe calcified stenosis in the large caliber obtuse marginal branch Dominant RCA with severe calcified lesion in the proximal and mid vessel. Elevated right heart pressures with wedge pressure 30-35 mmHg Recommendations: Will need to consider CABG/AVR given multi-vessel CAD and severe AS. 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 Currently with uncontrolled atrial fib on amiodarone  and volume overload.  Will hold Plavix  and ask CT surgery to see her.   DG CHEST PORT 1 VIEW Result Date: 04/18/2024 CLINICAL DATA:  Shortness of breath EXAM: PORTABLE CHEST 1 VIEW COMPARISON:  Chest x-ray 04/17/2024 FINDINGS: Heart is enlarged. There is central pulmonary vascular congestion and central peribronchial thickening. There is no pleural effusion or pneumothorax. Surgical clips overlie the lung apex. No acute fractures are seen. IMPRESSION: Cardiomegaly with central pulmonary vascular congestion. Central peribronchial wall thickening, likely infectious/inflammatory. Electronically Signed   By: Greig Pique M.D.   On: 04/18/2024 17:19   ECHOCARDIOGRAM COMPLETE Result Date: 04/18/2024    ECHOCARDIOGRAM REPORT   Patient Name:   Emily [name] Date of Exam: 04/18/2024 Medical Rec #:  [id number]        Height:       62.0 in Accession #:    [id number]       Weight:       204.3 lb Date of Birth:  11-29-1950        BSA:          1.929 m Patient Age:    73 years         BP:           102/78 mmHg Patient  Gender: F                HR:           106 bpm. Exam Location:  [facility] Procedure: 2D Echo, Cardiac Doppler, Color Doppler and Intracardiac            Opacification Agent (Both Spectral and Color Flow Doppler were            utilized during procedure). Indications:    Aortic stenosis I35.0  History:        Patient has prior history of Echocardiogram examinations, most                 recent 12/07/2023. Arrythmias:Atrial Fibrillation; Risk                 Factors:Hypertension.  Sonographer:    Tinnie Gosling RDCS Referring Phys: [id number] DORN FALCON BRANCH IMPRESSIONS  1. Left ventricular ejection fraction, by estimation, is 45 to 50%. The left ventricle has mildly decreased function. Left ventricular endocardial border not optimally defined to evaluate regional wall motion. There is severe left ventricular hypertrophy. Left ventricular diastolic parameters are indeterminate.  2. Right ventricular systolic function is normal. The  right ventricular size is normal. Tricuspid regurgitation signal is inadequate for assessing PA pressure.  3. Left atrial size was mildly dilated.  4. The mitral valve is abnormal. Mild mitral valve regurgitation. Mild to moderate mitral stenosis. The mean mitral valve gradient is 5.0 mmHg. Moderate mitral annular calcification.  5. AVA VTI 0.61, mean gradient 28 mmHg, DI 0.23, SVI 23. Severe aortic stenosis with lower than expected gradient due to decreased stroke volume index. . The aortic valve is tricuspid. There is moderate calcification of the aortic valve. There is moderate thickening of the aortic valve. Aortic valve regurgitation is not visualized. Severe aortic valve stenosis.  6. The inferior vena cava is dilated in size with >50% respiratory variability, suggesting right atrial pressure of 8 mmHg. FINDINGS  Left Ventricle: Left ventricular ejection fraction, by estimation, is 45 to 50%. The left ventricle has mildly decreased function. Left ventricular endocardial border not optimally defined to evaluate regional wall motion. The left ventricular internal cavity size was normal in size. There is severe left ventricular hypertrophy. Left ventricular diastolic parameters are indeterminate. Right Ventricle: The right ventricular size is normal. Right vetricular wall thickness was not well visualized. Right ventricular systolic function is normal. Tricuspid regurgitation signal is inadequate for assessing PA pressure. Left Atrium: Left atrial size was mildly dilated. Right Atrium: Right atrial size was normal in size. Pericardium: There is no evidence of pericardial effusion. Mitral Valve: The mitral valve is abnormal. There is moderate thickening of the mitral valve leaflet(s). There is moderate calcification of the mitral valve leaflet(s). Moderate mitral annular calcification. Mild mitral valve regurgitation. Mild to moderate mitral valve stenosis. The mean mitral valve gradient is 5.0 mmHg. Tricuspid  Valve: The tricuspid valve is not well visualized. Tricuspid valve regurgitation is not demonstrated. No evidence of tricuspid stenosis. Aortic Valve: AVA VTI 0.61, mean gradient 28 mmHg, DI 0.23, SVI 23. Severe aortic stenosis with lower than expected gradient due to decreased stroke volume index. The aortic valve is tricuspid. There is moderate calcification of the aortic valve. There is moderate thickening of the aortic valve. There is moderate aortic valve annular calcification. Aortic valve regurgitation is not visualized. Severe aortic stenosis is present. Aortic valve mean gradient measures 27.5 mmHg. Aortic valve peak gradient measures 42.6 mmHg. Aortic valve area,  by VTI measures 0.61 cm. Pulmonic Valve: The pulmonic valve was not well visualized. Pulmonic valve regurgitation is mild. No evidence of pulmonic stenosis. Aorta: The aortic root and ascending aorta are structurally normal, with no evidence of dilitation. Venous: The inferior vena cava is dilated in size with greater than 50% respiratory variability, suggesting right atrial pressure of 8 mmHg. IAS/Shunts: No atrial level shunt detected by color flow Doppler.  LEFT VENTRICLE PLAX 2D LVIDd:         4.40 cm   Diastology LVIDs:         3.50 cm   LV e' lateral:   3.70 cm/s LV PW:         1.50 cm   LV E/e' lateral: 45.4 LV IVS:        1.60 cm LVOT diam:     1.80 cm LV SV:         45 LV SV Index:   23 LVOT Area:     2.54 cm  RIGHT VENTRICLE            IVC RV S prime:     9.46 cm/s  IVC diam: 2.00 cm TAPSE (M-mode): 0.7 cm LEFT ATRIUM           Index        RIGHT ATRIUM           Index LA diam:      4.40 cm 2.28 cm/m   RA Area:     18.40 cm LA Vol (A4C): 66.2 ml 34.32 ml/m  RA Volume:   54.80 ml  28.41 ml/m  AORTIC VALVE AV Area (VTI):     0.61 cm AV Vmax:           326.49 cm/s AV Vmean:          249.200 cm/s AV VTI:            0.735 m AV Peak Grad:      42.6 mmHg AV Mean Grad:      27.5 mmHg LVOT VTI:          0.175 m LVOT/AV VTI ratio: 0.24   AORTA Ao Root diam: 2.60 cm Ao Asc diam:  3.10 cm MITRAL VALVE MV Area (PHT): 2.51 cm     SHUNTS MV Mean grad:  5.0 mmHg     Systemic VTI:  0.18 m MV Decel Time: 302 msec     Systemic Diam: 1.80 cm MV E velocity: 168.00 cm/s Dorn Ross MD Electronically signed by Dorn Ross MD Signature Date/Time: 04/18/2024/12:30:37 PM    Final    US  Venous Img Lower Bilateral Result Date: 04/17/2024 EXAM: ULTRASOUND DUPLEX OF THE BILATERAL LOWER EXTREMITY VEINS TECHNIQUE: Duplex ultrasound using B-mode/gray scaled imaging and Doppler spectral analysis and color flow was obtained of the deep venous structures of the bilateral lower extremity. COMPARISON: 02/23/2023 and previous. CLINICAL HISTORY: swelling FINDINGS: LEFT: The common femoral vein, femoral vein, popliteal vein, and posterior tibial vein of the left lower extremity demonstrate normal compressibility with normal color flow and spectral analysis. RIGHT: The common femoral vein, femoral vein, popliteal vein, and posterior tibial vein of the right lower extremity demonstrate normal compressibility with normal color flow and spectral analysis. IMPRESSION: 1. No evidence of deep venous thrombosis (DVT). Electronically signed by: Katheleen Faes MD 04/17/2024 05:06 PM EST RP Workstation: [id number]   DG Chest 2 View Result Date: 04/17/2024 CLINICAL DATA:  Congestive heart failure EXAM: CHEST - 2 VIEW COMPARISON:  Chest radiograph dated  04/06/2024 FINDINGS: Surgical clips project over the right apex. Normal lung volumes. No focal consolidations. Trace blunting of bilateral costophrenic angles. No pneumothorax. Enlarged cardiomediastinal silhouette is likely projectional. Septal occlusion device in-situ. No acute osseous abnormality. IMPRESSION: Trace blunting of bilateral costophrenic angles, which may represent trace pleural effusions. Electronically Signed   By: Limin  Xu M.D.   On: 04/17/2024 13:32   LONG TERM MONITOR (3-14 DAYS) Result Date:  04/14/2024 Predominant rhythm was sinus rhythm with sinus bradycardia First degree AV block was noted Paroxysmal atrial fib noted 12% burden, with the longest run being 27 hours. Post termination pause noted lasting 3.8 seconds.   DG Chest 2 View Result Date: 04/06/2024 EXAM: 2 VIEW(S) XRAY OF THE CHEST 04/06/2024 12:35:00 AM COMPARISON: CXR 04/25/2022 CLINICAL HISTORY: shortness of breath FINDINGS: LUNGS AND PLEURA: No focal pulmonary opacity. No pulmonary edema. No pleural effusion. No pneumothorax. HEART AND MEDIASTINUM: No acute abnormality of the cardiac and mediastinal silhouettes. BONES AND SOFT TISSUES: No acute osseous abnormality. IMPRESSION: 1. No acute process. Electronically signed by: Greig Pique MD 04/06/2024 12:43 AM EST RP Workstation: [id number]   DG Chest 2 View Result Date: 04/03/2024 EXAM: 2 VIEW(S) XRAY OF THE CHEST 04/03/2024 11:10:53 PM COMPARISON: 05/06/2023 CLINICAL HISTORY: palpitations palpitations FINDINGS: LUNGS AND PLEURA: No focal pulmonary opacity. No pulmonary edema. No pleural effusion. No pneumothorax. HEART AND MEDIASTINUM: Aortic atherosclerosis. Borderline heart size. BONES AND SOFT TISSUES: No acute osseous abnormality. IMPRESSION: 1. No acute cardiopulmonary process identified. 2. Borderline cardiomegaly. Electronically signed by: Franky Crease MD 04/03/2024 11:40 PM EST RP Workstation: [id number]    Subjective: - no chest pain, shortness of breath, no abdominal pain, nausea or vomiting.   Discharge Exam: BP (!) 151/43 (BP Location: Right Arm)   Pulse (!) 56   Temp 97.7 F (36.5 C) (Oral)   Resp 16   Ht 5' 2 (1.575 m)   Wt 88.4 kg   SpO2 94%   BMI 35.63 kg/m   General: Pt is alert, awake, not in acute distress Cardiovascular: RRR, S1/S2 +, no rubs, no gallops Respiratory: CTA bilaterally, no wheezing, no rhonchi Abdominal: Soft, NT, ND, bowel sounds + Extremities: no edema, no cyanosis    The results of significant diagnostics from this  hospitalization (including imaging, microbiology, ancillary and laboratory) are listed below for reference.     Microbiology: Recent Results (from the past 240 hours)  MRSA Next Gen by PCR, Nasal     Status: None   Collection Time: 04/17/24  9:23 PM   Specimen: Nasal Mucosa; Nasal Swab  Result Value Ref Range Status   MRSA by PCR Next Gen NOT DETECTED NOT DETECTED Final    Comment: (NOTE) The GeneXpert MRSA Assay (FDA approved for NASAL specimens only), is one component of a comprehensive MRSA colonization surveillance program. It is not intended to diagnose MRSA infection nor to guide or monitor treatment for MRSA infections. Test performance is not FDA approved in patients less than 30 years old. Performed at [facility], [address], [place], [place] [address]      Labs: Basic Metabolic Panel: Recent Labs  Lab 04/19/24 0522 04/20/24 0510 04/21/24 0405 04/21/24 1021 04/21/24 1032 04/22/24 0149 04/23/24 0305 04/24/24 1051 04/25/24 0458  NA 138 140 136   < > 137  132* 137 138 139 139  K 3.8 3.3* 3.7   < > 3.6  3.6 4.2 4.0 4.1 3.6  CL 99 100 98  --   --  99 99 95* 102  CO2 23 29 24   --   --  23 26 31 27   GLUCOSE 286* 158* 174*  --   --  151* 103* 225* 137*  BUN 31* 35* 29*  --   --  30* 32* 27* 30*  CREATININE 0.99 0.88 0.74  --   --  0.96 0.91 0.45 0.87  CALCIUM 8.9 8.5* 8.4*  --   --  8.4* 8.5* 9.1 8.5*  MG 1.8 2.2 1.8  --   --  2.2 2.0  --   --   PHOS  --   --   --   --   --  3.4  --   --   --    < > = values in this interval not displayed.   Liver Function Tests: Recent Labs  Lab 04/22/24 0149  AST 15  ALT 15  ALKPHOS 79  BILITOT 1.5*  PROT 6.3*  ALBUMIN 3.4*   CBC: Recent Labs  Lab 04/19/24 0522 04/20/24 0510 04/21/24 1021 04/21/24 1032 04/22/24 0149  WBC 8.9 10.9*  --   --  10.8*  HGB 12.3 10.1* 10.2* 11.6*  11.6* 11.6*  HCT 38.0 31.8* 30.0* 34.0*  34.0* 35.8*  MCV 93.4 94.6  --   --  92.3  PLT 235 207  --   --  258   CBG: Recent Labs   Lab 04/24/24 0929 04/24/24 1149 04/24/24 1552 04/24/24 2047 04/25/24 0719  GLUCAP 207* 178* 150* 193* 139*   Hgb A1c No results for input(s): HGBA1C in the last 72 hours. Lipid Profile No results for input(s): CHOL, HDL, LDLCALC, TRIG, CHOLHDL, LDLDIRECT in the last 72 hours. Thyroid  function studies No results for input(s): TSH, T4TOTAL, T3FREE, THYROIDAB in the last 72 hours.  Invalid input(s): FREET3 Urinalysis    Component Value Date/Time   COLORURINE YELLOW 04/17/2024 1515   APPEARANCEUR HAZY (A) 04/17/2024 1515   APPEARANCEUR Clear 03/02/2023 1047   LABSPEC 1.018 04/17/2024 1515   PHURINE 5.0 04/17/2024 1515   GLUCOSEU NEGATIVE 04/17/2024 1515   HGBUR NEGATIVE 04/17/2024 1515   BILIRUBINUR NEGATIVE 04/17/2024 1515   BILIRUBINUR Negative 03/02/2023 1047   KETONESUR NEGATIVE 04/17/2024 1515   PROTEINUR NEGATIVE 04/17/2024 1515   UROBILINOGEN 0.2 05/26/2014 1611   NITRITE NEGATIVE 04/17/2024 1515   LEUKOCYTESUR NEGATIVE 04/17/2024 1515    FURTHER DISCHARGE INSTRUCTIONS:   Get Medicines reviewed and adjusted: Please take all your medications with you for your next visit with your Primary MD   Laboratory/radiological data: Please request your Primary MD to go over all hospital tests and procedure/radiological results at the follow up, please ask your Primary MD to get all Hospital records sent to his/her office.   In some cases, they will be blood work, cultures and biopsy results pending at the time of your discharge. Please request that your primary care M.D. goes through all the records of your hospital data and follows up on these results.   Also Note the following: If you experience worsening of your admission symptoms, develop shortness of breath, life threatening emergency, suicidal or homicidal thoughts you must seek medical attention immediately by calling 911 or calling your MD immediately  if symptoms less severe.   You must read  complete instructions/literature along with all the possible adverse reactions/side effects for all the Medicines you take and that have been prescribed to you. Take any new Medicines after you have completely understood and accpet all the possible adverse reactions/side effects.    Do not drive when  taking Pain medications or sleeping medications (Benzodaizepines)   Do not take more than prescribed Pain, Sleep and Anxiety Medications. It is not advisable to combine anxiety,sleep and pain medications without talking with your primary care practitioner   Special Instructions: If you have smoked or chewed Tobacco  in the last 2 yrs please stop smoking, stop any regular Alcohol  and or any Recreational drug use.   Wear Seat belts while driving.   Please note: You were cared for by a hospitalist during your hospital stay. Once you are discharged, your primary care physician will handle any further medical issues. Please note that NO REFILLS for any discharge medications will be authorized once you are discharged, as it is imperative that you return to your primary care physician (or establish a relationship with a primary care physician if you do not have one) for your post hospital discharge needs so that they can reassess your need for medications and monitor your lab values.  Time coordinating discharge: 40 minutes  SIGNED:  Nilda Fendt, MD, PhD 04/25/2024, 10:20 AM

## 2024-04-25 NOTE — Progress Notes (Signed)
  [facility]   MULTIDISCIPLINARY HEART VALVE TEAM  Patient discussed at our valve team meeting this AM. Plan for PCI to RCA followed by staged TAVR. She can be discharged from our standpoint and follow up with Dr. Verlin on 12/4 in the office to set up PCI.   Pt and nephew's wife agreeable to plan and anxious for discharge home.      Lamarr Hummer PA-C  [facility]  Pager [phone]

## 2024-04-25 NOTE — Progress Notes (Addendum)
 Progress Note  Patient Name: Emily [name] Date of Encounter: 04/25/2024 [facility] Cardiologist: Lynwood Schilling, MD   Interval Summary    Feels well this morning, family at bedside.  Vital Signs Vitals:   04/24/24 1551 04/24/24 2001 04/24/24 2300 04/25/24 0500  BP: (!) 139/47 (!) 164/56 (!) 148/59   Pulse: (!) 58 (!) 57 (!) 56 (!) 52  Resp: 16 17 16 16   Temp: 97.7 F (36.5 C) 97.6 F (36.4 C)  98.1 F (36.7 C)  TempSrc: Oral Oral  Oral  SpO2: 96% 97% 95% 92%  Weight:    88.4 kg  Height:        Intake/Output Summary (Last 24 hours) at 04/25/2024 0708 Last data filed at 04/25/2024 0500 Gross per 24 hour  Intake 363 ml  Output 800 ml  Net -437 ml      04/25/2024    5:00 AM 04/24/2024    5:04 AM 04/23/2024    5:00 AM  Last 3 Weights  Weight (lbs) 194 lb 12.8 oz 195 lb 11.2 oz 196 lb 11.2 oz  Weight (kg) 88.361 kg 88.769 kg 89.223 kg      Cardiac Studies:  Cath: 04/21/2024  Diagnostic Dominance: Right   Telemetry/ECG   Sinus Bradycardia, rates 50-60 at times  - Personally Reviewed  Physical Exam  GEN: No acute distress.   Neck: No JVD Cardiac: RRR, 3/6 systolic murmurs LUSB, no rubs, or gallops.  Respiratory: Clear to auscultation bilaterally. GI: Soft, nontender, non-distended  MS: No edema  Assessment & Plan   Patient with past medical history significant for persistent AF/RVR, prior GIB s/p Watchman implant, severe AS, multivessel CAD, heart failure with mildly reduced EF, HTN, HLD, DM2, hypothyroidism who presented with atrial fibrillation with RVR and CHF.  Persistent atrial flutter/fibrillation with RVR S/p Watchman '20 -- Presented with atrial fibrillation with RVR, converted to sinus rhythm on IV amiodarone  11/20 but had intermittent paroxysmal episodes since admission.  Currently maintaining sinus rhythm -- Previously failed flecainide , diltiazem  and digoxin  were discontinued -- On amiodarone  400 mg twice daily would  continue 11/28, drop to 200mg  BID 11/29 -- of note, was on Amio, diltiazem  and metoprolol  at home.  Given the increase in her amiodarone , would favor deferring resumption of diltiazem  at discharge. -- Per EP, if continues to have persistent episodes despite amiodarone  could consider AVN ablation/pace strategy with baseline bifascicular block would be high risk for needing PPM in the future to either TAVR versus SAVR ultimately have complete heart block.  Not felt to be able to tolerate 90 days of DOAC if ablation were considered. Has outpatient follow up with EP (EP appointment has been pushed back to her later date)  Severe low-flow low gradient aortic stenosis -- 11/2023 echo: LVE 60-65%, no WMAs, severe asymmetrical LVH, normal RV, severe LAE  AVA VTI 0.74 mean grad 36 DI 0.23 SVI 42. Moderate to severe AS with majority of criteria favoring severe.  -- 04/2024 echo: LVE 45-50%, indet diastolic, normal RV function, mild to mod MS mean grad 5, severe AS mean grad 28 AVA VTI 0.61 DI 0.23 SVI 23. Lower than expected gradient due to low SVI.  -- TAVR CT scans with tricuspid aortic valve with severely reduced cusp excursion, severely thickened and severely calcified aortic valve cusp => Per valve team discussion, plan is for TAVR/PCI  CAD -- Severe multivessel CAD -- Seen by Dr. Daniel given her multivessel CAD in severe aortic stenosis, further recommendation were pending TAVR-PCI of RCA  Acute on chronic heart failure with mildly reduced EF -- Echo 11/18 with LVEF of 45 to 50% -- RHC elevated right heart pressures with wedge of 30-35, preserved CO -- net - 6L, has been transitioned back to p.o. Lasix  -- GDMT: On spironolactone  25 mg daily, Toprol  XL 100 mg daily.  No plans for SGLT2 inhibitor with recurrent UTIs. Continue Lasix  40 mg daily  Carotid artery disease -- Bilateral carotid artery duplex yesterday with right ICA of 50 to 70% stenosis detected within stent, left carotid artery of 1 to  39%  Hypertension --Elevated, continue Toprol  XL 100 mg daily, resume losartan  25 mg daily  Hyperlipidemia -- LDL 88, HDL 39 -- On Zetia  10 mg daily, history of statin intolerance but able to tolerate pravastatin  40 mg daily. Will refer to lipid clinic  Per discussion with structural team, plan will be in the clinic with Dr. Verlin to discuss PCI of the RCA with staged TAVR to follow.  As a result will resume patient's home Plavix .   For questions or updates, please contact [facility] Please consult [email or web address] for contact info under      Signed, Manuelita Rummer, NP    ATTENDING ATTESTATION  I have seen, examined and evaluated the patient this morning on rounds along with Manuelita Rummer, NP and we discussed with Lamarr Hummer, PA from the Structural Heart Team.  After reviewing all the available data and chart, we discussed the patients laboratory, study & physical findings as well as symptoms in detail.  I agree with her findings, examination as well as impression recommendations as per our discussion.    Attending adjustments noted in italics.   Patient is now clinically stable, in sinus rhythm to sinus bradycardia on amiodarone  with plans to complete oral load then taper down to her maintenance dose as noted above.  After long discussion, the plan is to move forward with RCA PCI and TAVR.  She has outpatient follow-up set up with Dr. Verlin.  Once the PCI/TAVR issue has been resolved, the next plan will be to discuss possible A-fib ablation with Dr. Inocencio.  Stable for discharge from cardiac standpoint.  All new medication changes have been made    Alm MICAEL Clay, MD, MS Alm Clay, M.D., M.S. Interventional Cardiologist  [facility] Pager # [phone]

## 2024-04-25 NOTE — Discharge Instructions (Signed)
Follow with Janora Norlander, DO in 5-7 days  Please get a complete blood count and chemistry panel checked by your Primary MD at your next visit, and again as instructed by your Primary MD. Please get your medications reviewed and adjusted by your Primary MD.  Please request your Primary MD to go over all Hospital Tests and Procedure/Radiological results at the follow up, please get all Hospital records sent to your Prim MD by signing hospital release before you go home.  In some cases, there will be blood work, cultures and biopsy results pending at the time of your discharge. Please request that your primary care M.D. goes through all the records of your hospital data and follows up on these results.  If you had Pneumonia of Lung problems at the Hospital: Please get a 2 view Chest X ray done in 6-8 weeks after hospital discharge or sooner if instructed by your Primary MD.  If you have Congestive Heart Failure: Please call your Cardiologist or Primary MD anytime you have any of the following symptoms:  1) 3 pound weight gain in 24 hours or 5 pounds in 1 week  2) shortness of breath, with or without a dry hacking cough  3) swelling in the hands, feet or stomach  4) if you have to sleep on extra pillows at night in order to breathe  Follow cardiac low salt diet and 1.5 lit/day fluid restriction.  If you have diabetes Accuchecks 4 times/day, Once in AM empty stomach and then before each meal. Log in all results and show them to your primary doctor at your next visit. If any glucose reading is under 80 or above 300 call your primary MD immediately.  If you have Seizure/Convulsions/Epilepsy: Please do not drive, operate heavy machinery, participate in activities at heights or participate in high speed sports until you have seen by Primary MD or a Neurologist and advised to do so again. Per [facility] statutes, patients with seizures are not allowed to drive until they have been  seizure-free for six months.  Use caution when using heavy equipment or power tools. Avoid working on ladders or at heights. Take showers instead of baths. Ensure the water temperature is not too high on the home water heater. Do not go swimming alone. Do not lock yourself in a room alone (i.e. bathroom). When caring for infants or small children, sit down when holding, feeding, or changing them to minimize risk of injury to the child in the event you have a seizure. Maintain good sleep hygiene. Avoid alcohol.   If you had Gastrointestinal Bleeding: Please ask your Primary MD to check a complete blood count within one week of discharge or at your next visit. Your endoscopic/colonoscopic biopsies that are pending at the time of discharge, will also need to followed by your Primary MD.  Get Medicines reviewed and adjusted. Please take all your medications with you for your next visit with your Primary MD  Please request your Primary MD to go over all hospital tests and procedure/radiological results at the follow up, please ask your Primary MD to get all Hospital records sent to his/her office.  If you experience worsening of your admission symptoms, develop shortness of breath, life threatening emergency, suicidal or homicidal thoughts you must seek medical attention immediately by calling 911 or calling your MD immediately  if symptoms less severe.  You must read complete instructions/literature along with all the possible adverse reactions/side effects for all the Medicines you  take and that have been prescribed to you. Take any new Medicines after you have completely understood and accpet all the possible adverse reactions/side effects.   Do not drive or operate heavy machinery when taking Pain medications.   Do not take more than prescribed Pain, Sleep and Anxiety Medications  Special Instructions: If you have smoked or chewed Tobacco  in the last 2 yrs please stop smoking, stop any regular  Alcohol  and or any Recreational drug use.  Wear Seat belts while driving.  Please note You were cared for by a hospitalist during your hospital stay. If you have any questions about your discharge medications or the care you received while you were in the hospital after you are discharged, you can call the unit and asked to speak with the hospitalist on call if the hospitalist that took care of you is not available. Once you are discharged, your primary care physician will handle any further medical issues. Please note that NO REFILLS for any discharge medications will be authorized once you are discharged, as it is imperative that you return to your primary care physician (or establish a relationship with a primary care physician if you do not have one) for your aftercare needs so that they can reassess your need for medications and monitor your lab values.  You can reach the hospitalist office at phone [phone] or fax [phone]   If you do not have a primary care physician, you can call [phone] for a physician referral.  Activity: As tolerated with Full fall precautions use walker/cane & assistance as needed    Diet: heart healthy  Disposition Home

## 2024-04-25 NOTE — Plan of Care (Signed)
 Problem: Education: Goal: Knowledge of General Education information will improve Description: Including pain rating scale, medication(s)/side effects and non-pharmacologic comfort measures Outcome: Adequate for Discharge   Problem: Health Behavior/Discharge Planning: Goal: Ability to manage health-related needs will improve Outcome: Adequate for Discharge   Problem: Clinical Measurements: Goal: Ability to maintain clinical measurements within normal limits will improve Outcome: Adequate for Discharge Goal: Will remain free from infection Outcome: Adequate for Discharge Goal: Diagnostic test results will improve Outcome: Adequate for Discharge Goal: Respiratory complications will improve Outcome: Adequate for Discharge Goal: Cardiovascular complication will be avoided Outcome: Adequate for Discharge   Problem: Activity: Goal: Risk for activity intolerance will decrease Outcome: Adequate for Discharge   Problem: Nutrition: Goal: Adequate nutrition will be maintained Outcome: Adequate for Discharge   Problem: Coping: Goal: Level of anxiety will decrease Outcome: Adequate for Discharge   Problem: Elimination: Goal: Will not experience complications related to bowel motility Outcome: Adequate for Discharge Goal: Will not experience complications related to urinary retention Outcome: Adequate for Discharge   Problem: Pain Managment: Goal: General experience of comfort will improve and/or be controlled Outcome: Adequate for Discharge   Problem: Safety: Goal: Ability to remain free from injury will improve Outcome: Adequate for Discharge   Problem: Skin Integrity: Goal: Risk for impaired skin integrity will decrease Outcome: Adequate for Discharge   Problem: Education: Goal: Knowledge of disease or condition will improve Outcome: Adequate for Discharge Goal: Understanding of medication regimen will improve Outcome: Adequate for Discharge Goal: Individualized  Educational Video(s) Outcome: Adequate for Discharge   Problem: Activity: Goal: Ability to tolerate increased activity will improve Outcome: Adequate for Discharge   Problem: Cardiac: Goal: Ability to achieve and maintain adequate cardiopulmonary perfusion will improve Outcome: Adequate for Discharge   Problem: Health Behavior/Discharge Planning: Goal: Ability to safely manage health-related needs after discharge will improve Outcome: Adequate for Discharge   Problem: Education: Goal: Ability to describe self-care measures that may prevent or decrease complications (Diabetes Survival Skills Education) will improve Outcome: Adequate for Discharge Goal: Individualized Educational Video(s) Outcome: Adequate for Discharge   Problem: Coping: Goal: Ability to adjust to condition or change in health will improve Outcome: Adequate for Discharge   Problem: Fluid Volume: Goal: Ability to maintain a balanced intake and output will improve Outcome: Adequate for Discharge   Problem: Health Behavior/Discharge Planning: Goal: Ability to identify and utilize available resources and services will improve Outcome: Adequate for Discharge Goal: Ability to manage health-related needs will improve Outcome: Adequate for Discharge   Problem: Metabolic: Goal: Ability to maintain appropriate glucose levels will improve Outcome: Adequate for Discharge   Problem: Nutritional: Goal: Maintenance of adequate nutrition will improve Outcome: Adequate for Discharge Goal: Progress toward achieving an optimal weight will improve Outcome: Adequate for Discharge   Problem: Skin Integrity: Goal: Risk for impaired skin integrity will decrease Outcome: Adequate for Discharge   Problem: Tissue Perfusion: Goal: Adequacy of tissue perfusion will improve Outcome: Adequate for Discharge   Problem: Education: Goal: Understanding of CV disease, CV risk reduction, and recovery process will improve Outcome:  Adequate for Discharge Goal: Individualized Educational Video(s) Outcome: Adequate for Discharge   Problem: Activity: Goal: Ability to return to baseline activity level will improve Outcome: Adequate for Discharge   Problem: Cardiovascular: Goal: Ability to achieve and maintain adequate cardiovascular perfusion will improve Outcome: Adequate for Discharge Goal: Vascular access site(s) Level 0-1 will be maintained Outcome: Adequate for Discharge   Problem: Health Behavior/Discharge Planning: Goal: Ability to safely manage health-related needs after  discharge will improve Outcome: Adequate for Discharge

## 2024-04-26 ENCOUNTER — Telehealth: Payer: Self-pay | Admitting: Physician Assistant

## 2024-04-26 ENCOUNTER — Other Ambulatory Visit (HOSPITAL_COMMUNITY): Payer: Self-pay

## 2024-04-26 ENCOUNTER — Telehealth: Payer: Self-pay | Admitting: *Deleted

## 2024-04-26 NOTE — Telephone Encounter (Signed)
   The patient called the answering service after-hours today. Chart reviewed with complex history. Recently discharged on increased dose of amiodarone  + higher dose Toprol  100mg  daily. Off diltiazem , flecainide , digoxin . She has been tracking HR at home and was told to call if running on the low side. She's noticed readings of 49bpm on different checks today. She feels fine but it is making her nervous to see these heart rate readings. She has been adherent to discharge regimen including knowledge of amiodarone  taper plan.   She still has Toprol  50mg  tablets on hand that are in date. Per our discussion, will have her go back to her previous in-date Toprol  50mg  daily tablets taking one tablet daily for now, but continue to monitor heart rate over the next few days. If still low or begins to trend high she will reach back out. She did not want to trial an in-between dose of 75mg  daily. Return precautions reviewed.  She had an appointment 12/3 with Dr. Inocencio but reports that got a call from our office's EP team to tell her she didn't need to keep this and it was rescheduled to February. I will route this message to Dr. Almetta (who evaluated patient for EP in the hospital) for review and advisement on revisiting EP follow-up plan.  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 Will cc to him so he is aware.   The patient verbalized understanding and gratitude.  Dhanya Bogle N Margret Moat, PA-C

## 2024-04-26 NOTE — Transitions of Care (Post Inpatient/ED Visit) (Signed)
 04/26/2024  Name: Emily [name] MRN: [id number] DOB: [birth year]  Today's TOC FU Call Status: Today's TOC FU Call Status:: Successful TOC FU Call Completed TOC FU Call Complete Date: 04/26/24  Patient's Name and Date of Birth confirmed. Name, DOB  Transition Care Management Follow-up Telephone Call Date of Discharge: 04/25/24 Discharge Facility: [facility]) Type of Discharge: Inpatient Admission Primary Inpatient Discharge Diagnosis:: Paroxysmal Atrial Fibrillation How have you been since you were released from the hospital?:  (eating, drinking well, ambulating without difficulty, no issues with bowel/ bladder) Any questions or concerns?: Yes Patient Questions/Concerns:: forgot to check my pulse before taking metoprolol  last night,  Heart rate today 48, 52, is my dosage of metoprolol  too high? Patient Questions/Concerns Addressed: Notified Provider of Patient Questions/Concerns  Items Reviewed: Did you receive and understand the discharge instructions provided?: Yes Medications obtained,verified, and reconciled?: Yes (Medications Reviewed) Any new allergies since your discharge?: No Dietary orders reviewed?: Yes Type of Diet Ordered:: heart healthy,  carbohydrate modified Do you have support at home?: Yes People in Home [RPT]: friend(s), other relative(s) Name of Support/Comfort Primary Source: nephew,   friends,  neighbors  Medications Reviewed Today: Medications Reviewed Today     Reviewed by Aura Mliss LABOR, RN (Registered Nurse) on 04/26/24 at 1125  Med List Status: <None>   Medication Order Taking? Sig Documenting Provider Last Dose Status Informant  amiodarone  (PACERONE ) 200 MG tablet [id number] Yes Take 2 tablets (400 mg total) by mouth 2 (two) times daily for 4 days until 11/28, THEN on 11/29 Take 1 tablet (200 mg total) 2 (two) times daily. Gherghe, Costin M, MD  Active   clopidogrel  (PLAVIX ) 75 MG tablet [id number] Yes TAKE 1 TABLET BY MOUTH EVERY DAY Bethanie Cough, PA-C  Active Self, Pharmacy Records  cyanocobalamin  ((VITAMIN B-12)) injection 1,000 mcg [id number]   Harl Jayson CROME, MD  Active   diphenhydramine -acetaminophen  (TYLENOL  PM) 25-500 MG TABS tablet [id number] Yes Take 1 tablet by mouth at bedtime as needed (pain). [provider]  Active Self, Pharmacy Records  ezetimibe  (ZETIA ) 10 MG tablet [id number] Yes TAKE 1 TABLET BY MOUTH EVERY DAY Lavona Agent, MD  Active Self, Pharmacy Records  fluticasone  (FLONASE ) 50 MCG/ACT nasal spray [id number] Yes Place 2 sprays into both nostrils daily. Jolinda Norene HERO, DO  Active Self, Pharmacy Records  furosemide  (LASIX ) 40 MG tablet [id number] Yes Take 1 tablet (40 mg total) by mouth daily. Gherghe, Costin M, MD  Active   gabapentin  (NEURONTIN ) 300 MG capsule [id number] Yes TAKE 1 CAPSULE (300 MG TOTAL) BY MOUTH 2 (TWO) TIMES DAILY AS NEEDED (NEUROPATHY RIGHT LEG). Jolinda Norene HERO, DO  Active Self, Pharmacy Records  levonorgestrel [facility]) 20 MCG/DAY IUD [id number]  1 each by Intrauterine route once. [provider]  Active Self, Pharmacy Records  levothyroxine  (SYNTHROID ) 112 MCG tablet [id number] Yes TAKE 1 TABLET BY MOUTH EVERY OTHER DAY, ALTERNATING WITH 1 & 1/2 TABLETS EVERY OTHER DAY  Patient taking differently: Take 112 mcg by mouth See admin instructions. TAKE 1 TABLET BY MOUTH EVERY OTHER DAY, ALTERNATING WITH 1 & 1/2 TABLETS EVERY OTHER DAY   Gottschalk, Ashly M, DO  Active Self, Pharmacy Records           Med Note [place], CHUCK KANDICE Debar Apr 18, 2024  7:57 PM) Pt is unsure if she took 1 or 1.5 tabs yesterday morning  linaclotide  (LINZESS ) 290 MCG CAPS capsule [id number]  Take 1 capsule (290 mcg total) by mouth daily before breakfast.  Patient not taking: Reported on 04/26/2024   Jolinda Norene HERO, DO  Active Self, Pharmacy Records           Med Note Adrian, CHUCK KANDICE Debar Apr 18, 2024  7:53 PM) Taking samples from her Dr  losartan  (COZAAR ) 25 MG tablet [id number] Yes  Take 1 tablet (25 mg total) by mouth daily. Jolinda Norene HERO, DO  Active Self, Pharmacy Records  metFORMIN  (GLUCOPHAGE ) 1000 MG tablet [id number] Yes Take 1 tablet (1,000 mg total) by mouth 2 (two) times daily with a meal.  Patient taking differently: Take 500 mg by mouth 2 (two) times daily with a meal. 500 mg BID   Jolinda Norene M, DO  Active Self, Pharmacy Records           Med Note JACKOLYN WADDELL DEL   Tue Apr 04, 2024  8:43 AM)    metoprolol  succinate (TOPROL -XL) 100 MG 24 hr tablet [id number] Yes Take 1 tablet (100 mg total) by mouth daily. Take with or immediately following a meal. Gherghe, Costin M, MD  Active   polyethylene glycol (MIRALAX  / GLYCOLAX ) 17 g packet [id number] Yes Take 17 g by mouth daily as needed for moderate constipation. [provider]  Active Self, Pharmacy Records  pravastatin  (PRAVACHOL ) 40 MG tablet [id number] Yes Take 1 tablet (40 mg total) by mouth daily at 6 PM. Gherghe, Costin M, MD  Active   rOPINIRole  (REQUIP ) 0.5 MG tablet [id number] Yes TAKE 1 TABLET BY MOUTH AT BEDTIME. Jolinda Norene HERO, DO  Active Self, Pharmacy Records  Semaglutide ,0.25 or 0.5MG /DOS, 2 MG/3ML NELMA [id number] Yes Inject 0.5 mg into the skin once a week. Jolinda Norene HERO, DO  Active Self, Pharmacy Records           Med Note SOILA LYLE BROCKS   Fri Mar 05, 2023 12:44 PM)    spironolactone  (ALDACTONE ) 25 MG tablet [id number] Yes Take 1 tablet (25 mg total) by mouth daily. Trixie Nilda HERO, MD  Active             Home Care and Equipment/Supplies: Were Home Health Services Ordered?: No Any new equipment or medical supplies ordered?: No  Functional Questionnaire: Do you need assistance with bathing/showering or dressing?: No Do you need assistance with meal preparation?: No Do you need assistance with eating?: No Do you have difficulty maintaining continence: No Do you need assistance with getting out of bed/getting out of a chair/moving?: No Do you have difficulty  managing or taking your medications?: No  Follow up appointments reviewed: PCP Follow-up appointment confirmed?: Yes (collaborated with care guide and scheduled post hospital follow up) Date of PCP follow-up appointment?: 05/05/24 Follow-up Provider: Ronal Lunger NP   @ [phone] am Specialist Hospital Follow-up appointment confirmed?: Yes Date of Specialist follow-up appointment?: 05/04/24 Follow-Up Specialty Provider:: 9 am with Dr. Verlin    cardiology/  EKG/  TAVR consult Do you need transportation to your follow-up appointment?: No Do you understand care options if your condition(s) worsen?: Yes-patient verbalized understanding  SDOH Interventions Today    Flowsheet Row Most Recent Value  SDOH Interventions   Food Insecurity Interventions Intervention Not Indicated  Housing Interventions Intervention Not Indicated  Transportation Interventions Intervention Not Indicated  Utilities Interventions Intervention Not Indicated    Goals Addressed             This Visit's Progress    VBCI Transitions of Care (TOC) Care Plan       Problems:  Recent Hospitalization for treatment  of Atrial Fibrillation No Hospital Follow Up Provider appointment pt does not have post hospital follow up appointment Pt reports she is weighing daily, checking blood pressure and heart rate daily and keeping a log, pt denies dyspnea, has only slight edema in feet  Goal:  Over the next 30 days, the patient will not experience hospital readmission  Interventions: AFIB Interventions:  Counseled on increased risk of stroke due to Afib and benefits of anticoagulation for stroke prevention Reviewed importance of adherence to anticoagulant exactly as prescribed Counseled on importance of regular laboratory monitoring as prescribed Afib action plan reviewed Screening for signs and symptoms of depression related to chronic disease state  Assessed social determinant of health barriers Reviewed A-Fib action  plan In basket message sent to cardiologist Dr. Lavona reporting blood pressure left arm 166/46, 97/43 and 94/47, right arm 119/54, pt took metoprolol  last night but forgot to check her pulse,  heart rate today earlier 48 and now 52, pt wants to make sure her dosage of metoprolol  100mg  is not too high as this is an increase  Patient Self Care Activities:  Attend all scheduled provider appointments Call pharmacy for medication refills 3-7 days in advance of running out of medications Call provider office for new concerns or questions  Notify RN Care Manager of TOC call rescheduling needs Participate in Transition of Care Program/Attend TOC scheduled calls Perform all self care activities independently  Perform IADL's (shopping, preparing meals, housekeeping, managing finances) independently Take medications as prescribed   begin a symptom diary bring symptom diary to all appointments check pulse (heart) rate before taking medicine make a plan to eat healthy keep all lab appointments take medicine as prescribed wear medical alert identification Continue to weigh daily, check blood pressure, heart rate and keep a log, take log to all provider appointments Please check your heart rate before taking metoprolol   Plan:  Telephone follow up appointment with care management team member scheduled for:  05/04/24 @ 315 pm The patient has been provided with contact information for the care management team and has been advised to call with any health related questions or concerns.         Mliss Creed [facility], BSN RN Care Manager/ Transition of Care [place]/ [facility] [phone]

## 2024-04-26 NOTE — Patient Outreach (Signed)
 04/26/2024  Patient ID: Emily [name], female   DOB: 1950/08/25, 73 y.o.   MRN: [id number]  Spoke with patient, informed pt our care guide scheduled her a post hospital follow up appointment for 05/05/24 @ 1115 am with Ronal Lunger NP @ [facility], pt verbalizes understanding.  Mliss Creed [facility], BSN RN Care Manager/ Transition of Care [place]/ [facility] [phone]

## 2024-05-03 ENCOUNTER — Ambulatory Visit: Admitting: Cardiology

## 2024-05-04 ENCOUNTER — Ambulatory Visit: Attending: Cardiovascular Disease | Admitting: Cardiovascular Disease

## 2024-05-04 ENCOUNTER — Encounter: Payer: Self-pay | Admitting: Cardiovascular Disease

## 2024-05-04 ENCOUNTER — Telehealth: Admitting: *Deleted

## 2024-05-04 VITALS — BP 121/68 | HR 51 | Ht 62.0 in | Wt 190.0 lb

## 2024-05-04 DIAGNOSIS — I350 Nonrheumatic aortic (valve) stenosis: Secondary | ICD-10-CM | POA: Diagnosis not present

## 2024-05-04 DIAGNOSIS — I480 Paroxysmal atrial fibrillation: Secondary | ICD-10-CM | POA: Diagnosis not present

## 2024-05-04 DIAGNOSIS — I25118 Atherosclerotic heart disease of native coronary artery with other forms of angina pectoris: Secondary | ICD-10-CM

## 2024-05-04 DIAGNOSIS — I428 Other cardiomyopathies: Secondary | ICD-10-CM

## 2024-05-04 DIAGNOSIS — I5022 Chronic systolic (congestive) heart failure: Secondary | ICD-10-CM

## 2024-05-04 MED ORDER — ASPIRIN 81 MG PO TBEC: 81.0000 mg | DELAYED_RELEASE_TABLET | Freq: Every day | ORAL | Status: AC

## 2024-05-04 NOTE — H&P (View-Only) (Signed)
 [facility] Consult Note  Chief Complaint  Patient presents with   Follow-up    CAD, Severe aortic stenosis   History of Present Illness: 73 yo female with history of PAF s/p Watchman due to recurrent GI bleeding, bifasciular block (RBBB, LAFB), DM type 2 with peripheral neuropathy, HTN, HLD, carotid artery disease s/p L CEA (2017) and right TCAR (03/2023) with 50-75% stenosis of stented RICA, severe asymmetric LVH (cMRI felt to be 2/2 AS), severe 3V CAD, ischemic cardiomyopathy, chronic systolic CHF and severe low flow/low gradient aortic stenosis who is here today for hospital follow up. Echo July 2025 with LVEF 60-65%, severe asymmetrical LVH, normal RV, severe LAE. Moderate to severe aortic stenosis with AVA 0.74 cm2, mean gradient , DI 0.23 SVI 42. Cardiac MRI October 2025 with asymmetrical septal LVH 1.6 cm, LVE 62%. She was referred to structural heart for consultation and had an appointment with Dr. Wonda on 04/18/24, however, she was admitted that day for worsening LE edema and SOB. Found to have acute CHF in the setting of atrial fibrillation with RVR. She was diuresed with IV lasix  and treated with IV amiodarone  with eventual conversion to NSR. She is now on po amiodarone . Echo 04/18/24 with LVEF=45-50%, severe LVH, mild to mod Mitral stenosis, mild MR, severe low flow/low gradient aortic stenosis with mean gradient 28 mmHg, AVA 0.6 cm2, DI 0.23, SVI 23. Right and left heart cath 04/21/24 with three vessel CAD. The RCA has three severe calcified lesions in the proxima and mid vessel. The LAD and OM branch have moderately severe lesions. Carotid/extremity dopplers 04/25/24 with 50-75% stenosis in stented RICA and moderate bilateral LE PAD. She was seen by Dr. Daniel for evaluation of CABG/AVR given multi-vessel CAD and severe AS. Her STS risk calculated score of morbidity and mortality is quite high at 26.9%.  She also voiced a preference for a minimally invasive approach given  lack of strong family support. CTs also showed a calcified aorta making her higher surgical risk. TAVR CTs completed 04/24/24: Cardiac gated CTA of the heart revealed anatomical characteristics consistent with aortic stenosis suitable for treatment by transcatheter aortic valve replacement without any significant complicating features. She sizes to a 23 mm Sapien 3 Ultra Resilia valve. CTA of the aorta and iliac vessels demonstrated what appear to be adequate pelvic vascular access to facilitate a left transfemoral approach. She will be at increased risk for HAVB given issues with tachy-brady and underlying bifasciular block (RBBB, LAFB).   She is here today for follow up to discuss PCI of the RCA and TAVR. She denies chest pain, dyspnea, palpitations, orthopnea, PND, dizziness, near syncope or syncope. She is feeling much better. Her LE edema is much better.   Ms. Berch lives independently and works as a interior and spatial designer.  She walks unassisted and able to take care of all her own ADLs but is overall not very active. Her only family support are nieces and nephews. She gets regular dental work.      Primary Care Physician: Jolinda Norene HERO, DO Primary Cardiologist: Lavona Referring Cardiologist: Lavona  Past Medical History:  Diagnosis Date   Anemia    Aortic stenosis    Arthritis    Bilateral carotid artery disease    CAD (coronary artery disease)    Complication of anesthesia    difficult intubation in past   Diabetes mellitus without complication (HCC)    type 2   Difficult intubation    states 'lady that did the sleep  study told me I have the smallest airway she has ever seen in an adult   Dyslipidemia 06/29/2017   HFrEF (heart failure with reduced ejection fraction) (HCC)    History of blood transfusion 09/2019   GI bleed - in [facility]   Hypertension    Hypothyroid    Obesity    Obstructive sleep apnea    occasional uses CPAP   Peripheral vascular disease    Persistent atrial  fibrillation (HCC) 01/16/2020   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 - xrays were negative   Upper GI bleed 09/25/2019    Past Surgical History:  Procedure Laterality Date   CATARACT EXTRACTION Left    CATARACT EXTRACTION W/PHACO Right 12/09/2015   Procedure: CATARACT EXTRACTION PHACO AND INTRAOCULAR LENS PLACEMENT (IOC);  Surgeon: Cherene Mania, MD;  Location: [facility];  Service: Ophthalmology;  Laterality: Right;  CDE:10.48   COLONOSCOPY W/ POLYPECTOMY     cyst on back of neck removed     DILATION AND CURETTAGE OF UTERUS     x 5   ENDARTERECTOMY Left 08/30/2015   Procedure: LEFT CAROYID ENDARTERECTOMY WITH XENOSURE BOVINE PERICARDIUM PATCH ANGIOPLASTY;  Surgeon: Gaile LELON New, MD;  Location: [facility];  Service: Vascular;  Laterality: Left;   RIGHT/LEFT HEART CATH AND CORONARY ANGIOGRAPHY N/A 04/21/2024   Procedure: RIGHT/LEFT HEART CATH AND CORONARY ANGIOGRAPHY;  Surgeon: Verlin Lonni BIRCH, MD;  Location: [facility];  Service: Cardiovascular;  Laterality: N/A;   TONSILLECTOMY     TRANSCAROTID ARTERY REVASCULARIZATION  Right 03/19/2023   Procedure: Right Transcarotid Artery Revascularization;  Surgeon: New Gaile LELON, MD;  Location: [facility];  Service: Vascular;  Laterality: Right;   ULTRASOUND GUIDANCE FOR VASCULAR ACCESS Left 03/19/2023   Procedure: ULTRASOUND GUIDANCE FOR VASCULAR ACCESS;  Surgeon: New Gaile LELON, MD;  Location: [facility];  Service: Vascular;  Laterality: Left;   UPPER GI ENDOSCOPY     x several    Current Outpatient Medications  Medication Sig Dispense Refill   amiodarone  (PACERONE ) 200 MG tablet Take 2 tablets (400 mg total) by mouth 2 (two) times daily for 4 days until 11/28, THEN on 11/29 Take 1 tablet (200 mg total) 2 (two) times daily. 196 tablet 0   aspirin  EC 81 MG tablet Take 1 tablet (81 mg total) by mouth daily. Swallow whole.     clopidogrel  (PLAVIX ) 75 MG tablet TAKE 1 TABLET BY MOUTH EVERY DAY 90 tablet 2    diphenhydramine -acetaminophen  (TYLENOL  PM) 25-500 MG TABS tablet Take 1 tablet by mouth at bedtime as needed (pain).     ezetimibe  (ZETIA ) 10 MG tablet TAKE 1 TABLET BY MOUTH EVERY DAY 90 tablet 2   fluticasone  (FLONASE ) 50 MCG/ACT nasal spray Place 2 sprays into both nostrils daily. 16 g 6   furosemide  (LASIX ) 40 MG tablet Take 1 tablet (40 mg total) by mouth daily. 90 tablet 0   gabapentin  (NEURONTIN ) 300 MG capsule TAKE 1 CAPSULE (300 MG TOTAL) BY MOUTH 2 (TWO) TIMES DAILY AS NEEDED (NEUROPATHY RIGHT LEG). 180 capsule 0   levonorgestrel (MIRENA) 20 MCG/DAY IUD 1 each by Intrauterine route once.     levothyroxine  (SYNTHROID ) 112 MCG tablet TAKE 1 TABLET BY MOUTH EVERY OTHER DAY, ALTERNATING WITH 1 & 1/2 TABLETS EVERY OTHER DAY (Patient taking differently: Take 112 mcg by mouth See admin instructions. TAKE 1 TABLET BY MOUTH EVERY OTHER DAY, ALTERNATING WITH 1 & 1/2 TABLETS EVERY OTHER DAY) 112 tablet 3   linaclotide  (LINZESS )  290 MCG CAPS capsule Take 1 capsule (290 mcg total) by mouth daily before breakfast.     losartan  (COZAAR ) 25 MG tablet Take 1 tablet (25 mg total) by mouth daily. 90 tablet 2   metFORMIN  (GLUCOPHAGE ) 1000 MG tablet Take 1 tablet (1,000 mg total) by mouth 2 (two) times daily with a meal. (Patient taking differently: Take 500 mg by mouth 2 (two) times daily with a meal. 500 mg BID) 180 tablet 3   metoprolol  succinate (TOPROL -XL) 50 MG 24 hr tablet Take 50 mg by mouth daily. Take with or immediately following a meal.     polyethylene glycol (MIRALAX  / GLYCOLAX ) 17 g packet Take 17 g by mouth daily as needed for moderate constipation.     pravastatin  (PRAVACHOL ) 40 MG tablet Take 1 tablet (40 mg total) by mouth daily at 6 PM. 90 tablet 0   rOPINIRole  (REQUIP ) 0.5 MG tablet TAKE 1 TABLET BY MOUTH AT BEDTIME. 90 tablet 0   Semaglutide ,0.25 or 0.5MG /DOS, 2 MG/3ML SOPN Inject 0.5 mg into the skin once a week. 3 mL    spironolactone  (ALDACTONE ) 25 MG tablet Take 1 tablet (25 mg total)  by mouth daily. 90 tablet 0   Current Facility-Administered Medications  Medication Dose Route Frequency Provider Last Rate Last Admin   cyanocobalamin  ((VITAMIN B-12)) injection 1,000 mcg  1,000 mcg Intramuscular Q30 days Harl Jayson CROME, MD   1,000 mcg at 03/21/24 1051    Allergies  Allergen Reactions   Statins Other (See Comments) and Cough    Not all statins but some cause cough and pain in legs.   Jardiance  [Empagliflozin ] Other (See Comments)    Recurrent vaginitis   Quinapril Hcl Cough   Tape Other (See Comments)    Redness, please use paper tape    Social History   Socioeconomic History   Marital status: Single    Spouse name: Not on file   Number of children: Not on file   Years of education: Not on file   Highest education level: Not on file  Occupational History   Not on file  Tobacco Use   Smoking status: Never    Passive exposure: Never   Smokeless tobacco: Never  Vaping Use   Vaping status: Never Used  Substance and Sexual Activity   Alcohol use: No    Alcohol/week: 0.0 standard drinks of alcohol   Drug use: Never   Sexual activity: Not Currently    Birth control/protection: Post-menopausal  Other Topics Concern   Not on file  Social History Narrative   Not on file   Social Drivers of Health   Financial Resource Strain: Low Risk  (04/21/2022)   Overall Financial Resource Strain (CARDIA)    Difficulty of Paying Living Expenses: Not hard at all  Food Insecurity: No Food Insecurity (04/26/2024)   Hunger Vital Sign    Worried About Running Out of Food in the Last Year: Never true    Ran Out of Food in the Last Year: Never true  Transportation Needs: No Transportation Needs (04/26/2024)   PRAPARE - Administrator, Civil Service (Medical): No    Lack of Transportation (Non-Medical): No  Physical Activity: Inactive (04/21/2022)   Exercise Vital Sign    Days of Exercise per Week: 0 days    Minutes of Exercise per Session: 0 min   Stress: No Stress Concern Present (04/21/2022)   [employer] of Occupational Health - Occupational Stress Questionnaire    Feeling of Stress :  Not at all  Social Connections: Moderately Integrated (04/20/2024)   Social Connection and Isolation Panel    Frequency of Communication with Friends and Family: More than three times a week    Frequency of Social Gatherings with Friends and Family: More than three times a week    Attends Religious Services: More than 4 times per year    Active Member of [employer] or Organizations: Yes    Attends Banker Meetings: More than 4 times per year    Marital Status: Never married  Intimate Partner Violence: Not At Risk (04/26/2024)   Humiliation, Afraid, Rape, and Kick questionnaire    Fear of Current or Ex-Partner: No    Emotionally Abused: No    Physically Abused: No    Sexually Abused: No    Family History  Problem Relation Age of Onset   COPD Father    Heart failure Father    Heart disease Father    Emphysema Father    Arrhythmia Sister    Arrhythmia Sister    Arrhythmia Sister        had PPM also   Cancer Sister     Review of Systems:  As stated in the HPI and otherwise negative.   BP 121/68   Pulse (!) 51   Ht 5' 2 (1.575 m)   Wt 190 lb (86.2 kg)   SpO2 99%   BMI 34.75 kg/m   Physical Examination: General: Well developed, well nourished, NAD  HEENT: OP clear, mucus membranes moist  SKIN: warm, dry. No rashes. Neuro: No focal deficits  Musculoskeletal: Muscle strength 5/5 all ext  Psychiatric: Mood and affect normal  Neck: No JVD, no carotid bruits, no thyromegaly, no lymphadenopathy.  Lungs:Clear bilaterally, no wheezes, rhonci, crackles Cardiovascular: Regular rate and rhythm. Loud, harsh, late peaking systolic murmur.  Abdomen:Soft. Bowel sounds present. Non-tender.  Extremities: Trace bilateral lower extremity edema.   EKG:  EKG is ordered today. The ekg ordered today demonstrates  EKG  Interpretation Date/Time:  Thursday May 04 2024 13:28:11 EST Ventricular Rate:  51 PR Interval:  158 QRS Duration:  150 QT Interval:  524 QTC Calculation: 482 R Axis:   -60  Text Interpretation: Sinus bradycardia Right bundle branch block Left anterior fascicular block Bifascicular block Minimal voltage criteria for LVH, may be normal variant ( R in aVL ) Confirmed by Verlin Bruckner [phone]) on 05/04/2024 1:36:58 PM    Echo 04/18/24:   1. Left ventricular ejection fraction, by estimation, is 45 to 50%. The  left ventricle has mildly decreased function. Left ventricular endocardial  border not optimally defined to evaluate regional wall motion. There is  severe left ventricular  hypertrophy. Left ventricular diastolic parameters are indeterminate.   2. Right ventricular systolic function is normal. The right ventricular  size is normal. Tricuspid regurgitation signal is inadequate for assessing  PA pressure.   3. Left atrial size was mildly dilated.   4. The mitral valve is abnormal. Mild mitral valve regurgitation. Mild to  moderate mitral stenosis. The mean mitral valve gradient is 5.0 mmHg.  Moderate mitral annular calcification.   5. AVA VTI 0.61, mean gradient 28 mmHg, DI 0.23, SVI 23. Severe aortic  stenosis with lower than expected gradient due to decreased stroke volume  index. . The aortic valve is tricuspid. There is moderate calcification of  the aortic valve. There is  moderate thickening of the aortic valve. Aortic valve regurgitation is not  visualized. Severe aortic valve stenosis.   6.  The inferior vena cava is dilated in size with >50% respiratory  variability, suggesting right atrial pressure of 8 mmHg.   FINDINGS   Left Ventricle: Left ventricular ejection fraction, by estimation, is 45  to 50%. The left ventricle has mildly decreased function. Left ventricular  endocardial border not optimally defined to evaluate regional wall motion.  The left  ventricular internal  cavity size was normal in size. There is severe left ventricular  hypertrophy. Left ventricular diastolic parameters are indeterminate.   Right Ventricle: The right ventricular size is normal. Right vetricular  wall thickness was not well visualized. Right ventricular systolic  function is normal. Tricuspid regurgitation signal is inadequate for  assessing PA pressure.   Left Atrium: Left atrial size was mildly dilated.   Right Atrium: Right atrial size was normal in size.   Pericardium: There is no evidence of pericardial effusion.   Mitral Valve: The mitral valve is abnormal. There is moderate thickening  of the mitral valve leaflet(s). There is moderate calcification of the  mitral valve leaflet(s). Moderate mitral annular calcification. Mild  mitral valve regurgitation. Mild to  moderate mitral valve stenosis. The mean mitral valve gradient is 5.0  mmHg.   Tricuspid Valve: The tricuspid valve is not well visualized. Tricuspid  valve regurgitation is not demonstrated. No evidence of tricuspid  stenosis.   Aortic Valve: AVA VTI 0.61, mean gradient 28 mmHg, DI 0.23, SVI 23. Severe  aortic stenosis with lower than expected gradient due to decreased stroke  volume index. The aortic valve is tricuspid. There is moderate  calcification of the aortic valve. There  is moderate thickening of the aortic valve. There is moderate aortic valve  annular calcification. Aortic valve regurgitation is not visualized.  Severe aortic stenosis is present. Aortic valve mean gradient measures  27.5 mmHg. Aortic valve peak gradient  measures 42.6 mmHg. Aortic valve area, by VTI measures 0.61 cm.   Pulmonic Valve: The pulmonic valve was not well visualized. Pulmonic valve  regurgitation is mild. No evidence of pulmonic stenosis.   Aorta: The aortic root and ascending aorta are structurally normal, with  no evidence of dilitation.   Venous: The inferior vena cava is dilated  in size with greater than 50%  respiratory variability, suggesting right atrial pressure of 8 mmHg.   IAS/Shunts: No atrial level shunt detected by color flow Doppler.     LEFT VENTRICLE  PLAX 2D  LVIDd:         4.40 cm   Diastology  LVIDs:         3.50 cm   LV e' lateral:   3.70 cm/s  LV PW:         1.50 cm   LV E/e' lateral: 45.4  LV IVS:        1.60 cm  LVOT diam:     1.80 cm  LV SV:         45  LV SV Index:   23  LVOT Area:     2.54 cm     RIGHT VENTRICLE            IVC  RV S prime:     9.46 cm/s  IVC diam: 2.00 cm  TAPSE (M-mode): 0.7 cm   LEFT ATRIUM           Index        RIGHT ATRIUM           Index  LA diam:      4.40  cm 2.28 cm/m   RA Area:     18.40 cm  LA Vol (A4C): 66.2 ml 34.32 ml/m  RA Volume:   54.80 ml  28.41 ml/m   AORTIC VALVE  AV Area (VTI):     0.61 cm  AV Vmax:           326.49 cm/s  AV Vmean:          249.200 cm/s  AV VTI:            0.735 m  AV Peak Grad:      42.6 mmHg  AV Mean Grad:      27.5 mmHg  LVOT VTI:          0.175 m  LVOT/AV VTI ratio: 0.24    AORTA  Ao Root diam: 2.60 cm  Ao Asc diam:  3.10 cm   MITRAL VALVE  MV Area (PHT): 2.51 cm     SHUNTS  MV Mean grad:  5.0 mmHg     Systemic VTI:  0.18 m  MV Decel Time: 302 msec     Systemic Diam: 1.80 cm  MV E velocity: 168.00 cm/s   Cardiac cath 04/21/24:    Prox RCA-1 lesion is 90% stenosed.   Prox RCA-2 lesion is 90% stenosed.   Mid RCA lesion is 90% stenosed.   RPDA lesion is 40% stenosed.   1st Mrg lesion is 95% stenosed.   Prox LAD to Mid LAD lesion is 50% stenosed.   Mid LAD lesion is 80% stenosed.   Severe three vessel CAD Moderate calcified stenosis throughout the proximal LAD. Severe mid LAD stenosis Severe calcified stenosis in the large caliber obtuse marginal branch Dominant RCA with severe calcified lesion in the proximal and mid vessel.  Elevated right heart pressures with wedge pressure 30-35 mmHg  Diagnostic Dominance: Right  Hemo Data  Flowsheet Row  Most Recent Value  Fick Cardiac Output 5.01 L/min  Fick Cardiac Output Index 2.62 (L/min)/BSA  RA A Wave 17 mmHg  RA V Wave 17 mmHg  RA Mean 16 mmHg  RV Systolic Pressure 52 mmHg  RV Diastolic Pressure 23 mmHg  RV EDP 16 mmHg  PA Systolic Pressure 51 mmHg  PA Diastolic Pressure 35 mmHg  PA Mean 38 mmHg  PW A Wave 37 mmHg  PW V Wave 39 mmHg  PW Mean 34 mmHg  AO Systolic Pressure 113 mmHg  AO Diastolic Pressure 68 mmHg  AO Mean 87 mmHg  LV Systolic Pressure 124 mmHg  LV Diastolic Pressure 13 mmHg  LV EDP 15 mmHg  Arterial Occlusion Pressure Extended Systolic Pressure 107 mmHg  Arterial Occlusion Pressure Extended Diastolic Pressure 74 mmHg  Arterial Occlusion Pressure Extended Mean Pressure 85 mmHg  Left Ventricular Apex Extended Systolic Pressure 124 mmHg  LVp Diastolic Pressure 23 mmHg  Left Ventricular Apex Extended EDP Pressure 22 mmHg  QP/QS 1  TPVR Index 14.52 HRUI  TSVR Index 33.25 HRUI  PVR SVR Ratio 0.06  TPVR/TSVR Ratio 0.44    Cardiac CT 04/24/24: FINDINGS: Aortic Valve:   Tricuspid aortic valve with severely reduced cusp excursion. Severely thickened and severely calcified aortic valve cusps.   AV calcium score: 1633   Virtual Basal Annulus Measurements:   Maximum/Minimum Diameter: 25.9 x 19.2 mm   Perimeter: 70.9 mm   Area:  383 mm2   Bulky calcification below right coronary cusp external to LVOT.   Membranous septal length: 11 mm   Based on these measurements, the annulus would be  suitable for a 23 mm Sapien 3 valve. Alternatively, Heart Team can consider 26 mm Evolut valve. Recommend Heart Team discussion for valve selection.   Sinus of Valsalva Measurements:   Non-coronary:  30 mm   Right - coronary:  29 mm   Left - coronary:  31 mm   Coronary height and sinus of Valsalva Height:   Left main: 13.7 mm, Left sinus: 19.4 mm   Right coronary: 16.6 mm, Right sinus: 20.1 mm   Aorta: Conventional 3 vessel branch pattern of aortic arch.    Sinotubular Junction:  27 mm   Ascending Thoracic Aorta:  33 mm   Aortic Arch:  26 mm   Descending Thoracic Aorta:  24 mm   Coronary Arteries: Normal coronary origin. Right dominance. The study was performed without use of NTG and insufficient for plaque evaluation.   Optimum Fluoroscopic Angle for Delivery: LAO 12 CAU 9   OTHER:   Atria: Biatrial dilation   Left atrial appendage: Watchman device well seated in LAA, no evidence of peridevice leak, no device related thrombus.   Mitral valve:  moderate mitral annular calcifications.   Pulmonary artery: Mild dilation of main pulmonary artery, 31 mm.   Pulmonary veins: Normal anatomy.   IMPRESSION: 1. 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 Severely thickened and severely calcified aortic valve cusps. 2. Aortic valve calcium score: 1633 3. Annulus area: 383 mm2, suitable for 23 mm Sapien 3 valve. No protruding LVOT calcifications, calcifications are external to annulus. Membranous septal length 11 mm. 4. Sufficient coronary artery heights from annulus. 5. Optimum fluoroscopic angle for delivery:  LAO 12 CAU 9  CT chest/abd/pelvis 04/24/24: AORTA: Moderate scattered calcified plaque throughout the thoracic aorta. Extensive calcified plaque through the abdominal aorta without an aneurysm. No dissection.   PULMONARY ARTERIES: No pulmonary embolism with the limits of this exam.   GREAT VESSELS OF AORTIC ARCH: 3 vessel brachiocephalic arterial origin anatomy without proximal stenosis. No dissection. No arterial occlusion or significant stenosis.   CELIAC TRUNK: osteal calcified plaque without high grade stenosis. No occlusion or significant stenosis.   SUPERIOR MESENTERIC ARTERY: origin calcified plaque extending over the length of at least 3 cm resulting in at least moderate stenosis, atheromatous but patent distally.   INFERIOR MESENTERIC ARTERY: Calcified plaque at the origin which remains  patent. No occlusion or significant stenosis.   RENAL ARTERIES: Single left renal artery with fairly heavily calcified ostial plaque resulting in stenosis of at least moderate severity. Duplicated right renal arteries, superior dominant with heavily calcified ostial plaque resulting in stenosis of at least moderate severity.   ILIAC ARTERIES: Scattered calcified plaque throughout bilateral common and internal iliac arteries without high grade stenosis or aneurysm. Mild tortuosity of the iliac arterial system bilaterally. Heavily calcified plaque in the right common femoral artery resulting in short segment stenosis of mild to moderate severity. Heavily calcified plaque in the proximal visualized aspect of bilateral superficial femoral arteries.   CHEST:   MEDIASTINUM: Occluder device in the left atrial appendage. Mitral annulus calcifications. Coronary leaflet calcifications. Extensive 3-vessel coronary calcifications. No mediastinal lymphadenopathy. The heart and pericardium demonstrate no acute abnormality.   LUNGS AND PLEURA: Small left and moderate right pleural effusions, new since previous. Dependent atelectasis/consolidation posteriorly in the lung bases. No pneumothorax.   THORACIC BONES AND SOFT TISSUES: No acute bone or soft tissue abnormality.   ABDOMEN AND PELVIS:   LIVER: Mildly nodular hepatic contour suggesting cirrhosis.   GALLBLADDER AND BILE DUCTS: Gallbladder is  unremarkable. No biliary ductal dilatation.   SPLEEN: The spleen is unremarkable.   PANCREAS: The pancreas is unremarkable.   ADRENAL GLANDS: Bilateral adrenal glands demonstrate no acute abnormality.   KIDNEYS, URETERS AND BLADDER: No stones in the kidneys or ureters. No hydronephrosis. No perinephric or periureteral stranding. Urinary bladder is unremarkable.   GI AND BOWEL: Stomach and duodenal sweep demonstrate no acute abnormality. There is no bowel obstruction. No abnormal bowel  wall thickening or distension.   REPRODUCTIVE: Reproductive organs are unremarkable.   PERITONEUM AND RETROPERITONEUM: No ascites or free air.   LYMPH NODES: No lymphadenopathy.   ABDOMINAL BONES AND SOFT TISSUES: multilevel spondylotic change through the lumbar spine. Mild L1 vertebral compression deformity with less than 20% loss of height, age indeterminate. Mild grade 1 anterolisthesis L4-L5 probably related to bilateral facet DJD. No acute soft tissue abnormality.   IMPRESSION: 1. SMA origin calcified plaque extending over at least 3 cm resulting in at least moderate stenosis, atheromatous but patent distally. 2. Single left renal artery with heavily calcified plaque resulting in at least moderate stenosis. 3. Duplicated right renal arteries, superior dominant, with heavily calcified plaque resulting in at least moderate stenosis. 4. Heavily calcified plaque in the right common femoral artery causing a short segment stenosis of mild to moderate severity. 5. Heavily calcified plaque in the proximal superficial femoral arteries bilaterally. Correlate with ABIs.  Recent Labs: 04/03/2024: B Natriuretic Peptide 558.5 04/19/2024: Pro Brain Natriuretic Peptide 6,770.0 04/21/2024: TSH 1.179 04/22/2024: ALT 15; Hemoglobin 11.6; Platelets 258 04/23/2024: Magnesium  2.0 04/25/2024: BUN 30; Creatinine, Ser 0.87; Potassium 3.6; Sodium 139    Wt Readings from Last 3 Encounters:  05/04/24 190 lb (86.2 kg)  04/26/24 189 lb (85.7 kg)  04/25/24 194 lb 12.8 oz (88.4 kg)    Assessment and Plan:   1. Severe Aortic Valve Stenosis: She has severe low flow/low gradient aortic stenosis. NYHA class 2 symptoms. I have personally reviewed the echo images. The aortic valve is thickened and calcified with limited leaflet mobility. I think she would benefit from AVR. As above, after formal CT surgery consultation, she is not felt to be a good candidate for conventional AVR by surgical approach with  bypass given heavily calcified ascending aorta. I think she will be a candidate for TAVR.   I have reviewed the natural history of aortic stenosis with the patient and their family members  who are present today. We have discussed the limitations of medical therapy and the poor prognosis associated with symptomatic aortic stenosis. We have reviewed potential treatment options, including palliative medical therapy, conventional surgical aortic valve replacement, and transcatheter aortic valve replacement. We discussed treatment options in the context of the patient's specific comorbid medical conditions.   She would like to proceed with TAVR. Risks and benefits of the valve procedure are reviewed with the patient. We will address her CAD first (see below).   2. CAD with angina: Severe heavily calcified RCA stenoses in the proximal and mid vessel with moderate OM and LAD disease. Will plan PCI of the RCA at [facility] 05/17/24. Treatment of the RCA will require orbital atherectomy and stenting. This procedure is very high risk but is indicated. I have referred her to CT surgery to discuss CABG but this is not felt to be an option. She is here today with her nephew's wife. The pt and family understand the procedure is high risk.    I have reviewed the risks, indications, and alternatives to cardiac catheterization, possible angioplasty, and  stenting with the patient. Risks include but are not limited to bleeding, infection, vascular injury, stroke, myocardial infection, arrhythmia, kidney injury, radiation-related injury in the case of prolonged fluoroscopy use, emergency cardiac surgery, and death. The patient understands the risks of serious complication is 1-2 in 1000 with diagnostic cardiac cath and 1-2% or less with angioplasty/stenting.  -BMET and CBC today  -Continue Plavix , Zetia  and Pravachol . Start ASA today  3. Atrial fibrillation, paroxysmal: Sinus brady today. Continue amiodarone , metoprolol .  Watchman in place.   4. Cardiomyopathy/Chronic systolic CHF: Wt is stable. Clinically much improved. No volume overload. Continue Lasix .    Labs/ tests ordered today include:  Orders Placed This Encounter  Procedures   Basic Metabolic Panel (BMET)   CBC   EKG 12-Lead   Disposition:   F/U will be arranged with the structural team  Signed, Lonni Cash, MD, [facility] 05/04/2024 3:29 PM    [facility] [address] [place], [place], [place]  [address] Phone: [phone]; Fax: [phone]

## 2024-05-04 NOTE — Progress Notes (Signed)
 Pre Surgical Assessment: 5 M Walk Test  75M=16.26ft  5 Meter Walk Test- trial 1: 5.91 seconds 5 Meter Walk Test- trial 2: 6.23 seconds 5 Meter Walk Test- trial 3: 5.78 seconds 5 Meter Walk Test Average: 5.97 seconds

## 2024-05-04 NOTE — Progress Notes (Signed)
 [facility] Consult Note  Chief Complaint  Patient presents with   Follow-up    CAD, Severe aortic stenosis   History of Present Illness: 73 yo female with history of PAF s/p Watchman due to recurrent GI bleeding, bifasciular block (RBBB, LAFB), DM type 2 with peripheral neuropathy, HTN, HLD, carotid artery disease s/p L CEA (2017) and right TCAR (03/2023) with 50-75% stenosis of stented RICA, severe asymmetric LVH (cMRI felt to be 2/2 AS), severe 3V CAD, ischemic cardiomyopathy, chronic systolic CHF and severe low flow/low gradient aortic stenosis who is here today for hospital follow up. Echo July 2025 with LVEF 60-65%, severe asymmetrical LVH, normal RV, severe LAE. Moderate to severe aortic stenosis with AVA 0.74 cm2, mean gradient , DI 0.23 SVI 42. Cardiac MRI October 2025 with asymmetrical septal LVH 1.6 cm, LVE 62%. She was referred to structural heart for consultation and had an appointment with Dr. Wonda on 04/18/24, however, she was admitted that day for worsening LE edema and SOB. Found to have acute CHF in the setting of atrial fibrillation with RVR. She was diuresed with IV lasix  and treated with IV amiodarone  with eventual conversion to NSR. She is now on po amiodarone . Echo 04/18/24 with LVEF=45-50%, severe LVH, mild to mod Mitral stenosis, mild MR, severe low flow/low gradient aortic stenosis with mean gradient 28 mmHg, AVA 0.6 cm2, DI 0.23, SVI 23. Right and left heart cath 04/21/24 with three vessel CAD. The RCA has three severe calcified lesions in the proxima and mid vessel. The LAD and OM branch have moderately severe lesions. Carotid/extremity dopplers 04/25/24 with 50-75% stenosis in stented RICA and moderate bilateral LE PAD. She was seen by Dr. Daniel for evaluation of CABG/AVR given multi-vessel CAD and severe AS. Her STS risk calculated score of morbidity and mortality is quite high at 26.9%.  She also voiced a preference for a minimally invasive approach given  lack of strong family support. CTs also showed a calcified aorta making her higher surgical risk. TAVR CTs completed 04/24/24: Cardiac gated CTA of the heart revealed anatomical characteristics consistent with aortic stenosis suitable for treatment by transcatheter aortic valve replacement without any significant complicating features. She sizes to a 23 mm Sapien 3 Ultra Resilia valve. CTA of the aorta and iliac vessels demonstrated what appear to be adequate pelvic vascular access to facilitate a left transfemoral approach. She will be at increased risk for HAVB given issues with tachy-brady and underlying bifasciular block (RBBB, LAFB).   She is here today for follow up to discuss PCI of the RCA and TAVR. She denies chest pain, dyspnea, palpitations, orthopnea, PND, dizziness, near syncope or syncope. She is feeling much better. Her LE edema is much better.   Ms. Berch lives independently and works as a interior and spatial designer.  She walks unassisted and able to take care of all her own ADLs but is overall not very active. Her only family support are nieces and nephews. She gets regular dental work.      Primary Care Physician: Jolinda Norene HERO, DO Primary Cardiologist: Lavona Referring Cardiologist: Lavona  Past Medical History:  Diagnosis Date   Anemia    Aortic stenosis    Arthritis    Bilateral carotid artery disease    CAD (coronary artery disease)    Complication of anesthesia    difficult intubation in past   Diabetes mellitus without complication (HCC)    type 2   Difficult intubation    states 'lady that did the sleep  study told me I have the smallest airway she has ever seen in an adult   Dyslipidemia 06/29/2017   HFrEF (heart failure with reduced ejection fraction) (HCC)    History of blood transfusion 09/2019   GI bleed - in [facility]   Hypertension    Hypothyroid    Obesity    Obstructive sleep apnea    occasional uses CPAP   Peripheral vascular disease    Persistent atrial  fibrillation (HCC) 01/16/2020   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 - xrays were negative   Upper GI bleed 09/25/2019    Past Surgical History:  Procedure Laterality Date   CATARACT EXTRACTION Left    CATARACT EXTRACTION W/PHACO Right 12/09/2015   Procedure: CATARACT EXTRACTION PHACO AND INTRAOCULAR LENS PLACEMENT (IOC);  Surgeon: Cherene Mania, MD;  Location: [facility];  Service: Ophthalmology;  Laterality: Right;  CDE:10.48   COLONOSCOPY W/ POLYPECTOMY     cyst on back of neck removed     DILATION AND CURETTAGE OF UTERUS     x 5   ENDARTERECTOMY Left 08/30/2015   Procedure: LEFT CAROYID ENDARTERECTOMY WITH XENOSURE BOVINE PERICARDIUM PATCH ANGIOPLASTY;  Surgeon: Gaile LELON New, MD;  Location: [facility];  Service: Vascular;  Laterality: Left;   RIGHT/LEFT HEART CATH AND CORONARY ANGIOGRAPHY N/A 04/21/2024   Procedure: RIGHT/LEFT HEART CATH AND CORONARY ANGIOGRAPHY;  Surgeon: Verlin Lonni BIRCH, MD;  Location: [facility];  Service: Cardiovascular;  Laterality: N/A;   TONSILLECTOMY     TRANSCAROTID ARTERY REVASCULARIZATION  Right 03/19/2023   Procedure: Right Transcarotid Artery Revascularization;  Surgeon: New Gaile LELON, MD;  Location: [facility];  Service: Vascular;  Laterality: Right;   ULTRASOUND GUIDANCE FOR VASCULAR ACCESS Left 03/19/2023   Procedure: ULTRASOUND GUIDANCE FOR VASCULAR ACCESS;  Surgeon: New Gaile LELON, MD;  Location: [facility];  Service: Vascular;  Laterality: Left;   UPPER GI ENDOSCOPY     x several    Current Outpatient Medications  Medication Sig Dispense Refill   amiodarone  (PACERONE ) 200 MG tablet Take 2 tablets (400 mg total) by mouth 2 (two) times daily for 4 days until 11/28, THEN on 11/29 Take 1 tablet (200 mg total) 2 (two) times daily. 196 tablet 0   aspirin  EC 81 MG tablet Take 1 tablet (81 mg total) by mouth daily. Swallow whole.     clopidogrel  (PLAVIX ) 75 MG tablet TAKE 1 TABLET BY MOUTH EVERY DAY 90 tablet 2    diphenhydramine -acetaminophen  (TYLENOL  PM) 25-500 MG TABS tablet Take 1 tablet by mouth at bedtime as needed (pain).     ezetimibe  (ZETIA ) 10 MG tablet TAKE 1 TABLET BY MOUTH EVERY DAY 90 tablet 2   fluticasone  (FLONASE ) 50 MCG/ACT nasal spray Place 2 sprays into both nostrils daily. 16 g 6   furosemide  (LASIX ) 40 MG tablet Take 1 tablet (40 mg total) by mouth daily. 90 tablet 0   gabapentin  (NEURONTIN ) 300 MG capsule TAKE 1 CAPSULE (300 MG TOTAL) BY MOUTH 2 (TWO) TIMES DAILY AS NEEDED (NEUROPATHY RIGHT LEG). 180 capsule 0   levonorgestrel (MIRENA) 20 MCG/DAY IUD 1 each by Intrauterine route once.     levothyroxine  (SYNTHROID ) 112 MCG tablet TAKE 1 TABLET BY MOUTH EVERY OTHER DAY, ALTERNATING WITH 1 & 1/2 TABLETS EVERY OTHER DAY (Patient taking differently: Take 112 mcg by mouth See admin instructions. TAKE 1 TABLET BY MOUTH EVERY OTHER DAY, ALTERNATING WITH 1 & 1/2 TABLETS EVERY OTHER DAY) 112 tablet 3   linaclotide  (LINZESS )  290 MCG CAPS capsule Take 1 capsule (290 mcg total) by mouth daily before breakfast.     losartan  (COZAAR ) 25 MG tablet Take 1 tablet (25 mg total) by mouth daily. 90 tablet 2   metFORMIN  (GLUCOPHAGE ) 1000 MG tablet Take 1 tablet (1,000 mg total) by mouth 2 (two) times daily with a meal. (Patient taking differently: Take 500 mg by mouth 2 (two) times daily with a meal. 500 mg BID) 180 tablet 3   metoprolol  succinate (TOPROL -XL) 50 MG 24 hr tablet Take 50 mg by mouth daily. Take with or immediately following a meal.     polyethylene glycol (MIRALAX  / GLYCOLAX ) 17 g packet Take 17 g by mouth daily as needed for moderate constipation.     pravastatin  (PRAVACHOL ) 40 MG tablet Take 1 tablet (40 mg total) by mouth daily at 6 PM. 90 tablet 0   rOPINIRole  (REQUIP ) 0.5 MG tablet TAKE 1 TABLET BY MOUTH AT BEDTIME. 90 tablet 0   Semaglutide ,0.25 or 0.5MG /DOS, 2 MG/3ML SOPN Inject 0.5 mg into the skin once a week. 3 mL    spironolactone  (ALDACTONE ) 25 MG tablet Take 1 tablet (25 mg total)  by mouth daily. 90 tablet 0   Current Facility-Administered Medications  Medication Dose Route Frequency Provider Last Rate Last Admin   cyanocobalamin  ((VITAMIN B-12)) injection 1,000 mcg  1,000 mcg Intramuscular Q30 days Harl Jayson CROME, MD   1,000 mcg at 03/21/24 1051    Allergies  Allergen Reactions   Statins Other (See Comments) and Cough    Not all statins but some cause cough and pain in legs.   Jardiance  [Empagliflozin ] Other (See Comments)    Recurrent vaginitis   Quinapril Hcl Cough   Tape Other (See Comments)    Redness, please use paper tape    Social History   Socioeconomic History   Marital status: Single    Spouse name: Not on file   Number of children: Not on file   Years of education: Not on file   Highest education level: Not on file  Occupational History   Not on file  Tobacco Use   Smoking status: Never    Passive exposure: Never   Smokeless tobacco: Never  Vaping Use   Vaping status: Never Used  Substance and Sexual Activity   Alcohol use: No    Alcohol/week: 0.0 standard drinks of alcohol   Drug use: Never   Sexual activity: Not Currently    Birth control/protection: Post-menopausal  Other Topics Concern   Not on file  Social History Narrative   Not on file   Social Drivers of Health   Financial Resource Strain: Low Risk  (04/21/2022)   Overall Financial Resource Strain (CARDIA)    Difficulty of Paying Living Expenses: Not hard at all  Food Insecurity: No Food Insecurity (04/26/2024)   Hunger Vital Sign    Worried About Running Out of Food in the Last Year: Never true    Ran Out of Food in the Last Year: Never true  Transportation Needs: No Transportation Needs (04/26/2024)   PRAPARE - Administrator, Civil Service (Medical): No    Lack of Transportation (Non-Medical): No  Physical Activity: Inactive (04/21/2022)   Exercise Vital Sign    Days of Exercise per Week: 0 days    Minutes of Exercise per Session: 0 min   Stress: No Stress Concern Present (04/21/2022)   [employer] of Occupational Health - Occupational Stress Questionnaire    Feeling of Stress :  Not at all  Social Connections: Moderately Integrated (04/20/2024)   Social Connection and Isolation Panel    Frequency of Communication with Friends and Family: More than three times a week    Frequency of Social Gatherings with Friends and Family: More than three times a week    Attends Religious Services: More than 4 times per year    Active Member of [employer] or Organizations: Yes    Attends Banker Meetings: More than 4 times per year    Marital Status: Never married  Intimate Partner Violence: Not At Risk (04/26/2024)   Humiliation, Afraid, Rape, and Kick questionnaire    Fear of Current or Ex-Partner: No    Emotionally Abused: No    Physically Abused: No    Sexually Abused: No    Family History  Problem Relation Age of Onset   COPD Father    Heart failure Father    Heart disease Father    Emphysema Father    Arrhythmia Sister    Arrhythmia Sister    Arrhythmia Sister        had PPM also   Cancer Sister     Review of Systems:  As stated in the HPI and otherwise negative.   BP 121/68   Pulse (!) 51   Ht 5' 2 (1.575 m)   Wt 190 lb (86.2 kg)   SpO2 99%   BMI 34.75 kg/m   Physical Examination: General: Well developed, well nourished, NAD  HEENT: OP clear, mucus membranes moist  SKIN: warm, dry. No rashes. Neuro: No focal deficits  Musculoskeletal: Muscle strength 5/5 all ext  Psychiatric: Mood and affect normal  Neck: No JVD, no carotid bruits, no thyromegaly, no lymphadenopathy.  Lungs:Clear bilaterally, no wheezes, rhonci, crackles Cardiovascular: Regular rate and rhythm. Loud, harsh, late peaking systolic murmur.  Abdomen:Soft. Bowel sounds present. Non-tender.  Extremities: Trace bilateral lower extremity edema.   EKG:  EKG is ordered today. The ekg ordered today demonstrates  EKG  Interpretation Date/Time:  Thursday May 04 2024 13:28:11 EST Ventricular Rate:  51 PR Interval:  158 QRS Duration:  150 QT Interval:  524 QTC Calculation: 482 R Axis:   -60  Text Interpretation: Sinus bradycardia Right bundle branch block Left anterior fascicular block Bifascicular block Minimal voltage criteria for LVH, may be normal variant ( R in aVL ) Confirmed by Verlin Bruckner [phone]) on 05/04/2024 1:36:58 PM    Echo 04/18/24:   1. Left ventricular ejection fraction, by estimation, is 45 to 50%. The  left ventricle has mildly decreased function. Left ventricular endocardial  border not optimally defined to evaluate regional wall motion. There is  severe left ventricular  hypertrophy. Left ventricular diastolic parameters are indeterminate.   2. Right ventricular systolic function is normal. The right ventricular  size is normal. Tricuspid regurgitation signal is inadequate for assessing  PA pressure.   3. Left atrial size was mildly dilated.   4. The mitral valve is abnormal. Mild mitral valve regurgitation. Mild to  moderate mitral stenosis. The mean mitral valve gradient is 5.0 mmHg.  Moderate mitral annular calcification.   5. AVA VTI 0.61, mean gradient 28 mmHg, DI 0.23, SVI 23. Severe aortic  stenosis with lower than expected gradient due to decreased stroke volume  index. . The aortic valve is tricuspid. There is moderate calcification of  the aortic valve. There is  moderate thickening of the aortic valve. Aortic valve regurgitation is not  visualized. Severe aortic valve stenosis.   6.  The inferior vena cava is dilated in size with >50% respiratory  variability, suggesting right atrial pressure of 8 mmHg.   FINDINGS   Left Ventricle: Left ventricular ejection fraction, by estimation, is 45  to 50%. The left ventricle has mildly decreased function. Left ventricular  endocardial border not optimally defined to evaluate regional wall motion.  The left  ventricular internal  cavity size was normal in size. There is severe left ventricular  hypertrophy. Left ventricular diastolic parameters are indeterminate.   Right Ventricle: The right ventricular size is normal. Right vetricular  wall thickness was not well visualized. Right ventricular systolic  function is normal. Tricuspid regurgitation signal is inadequate for  assessing PA pressure.   Left Atrium: Left atrial size was mildly dilated.   Right Atrium: Right atrial size was normal in size.   Pericardium: There is no evidence of pericardial effusion.   Mitral Valve: The mitral valve is abnormal. There is moderate thickening  of the mitral valve leaflet(s). There is moderate calcification of the  mitral valve leaflet(s). Moderate mitral annular calcification. Mild  mitral valve regurgitation. Mild to  moderate mitral valve stenosis. The mean mitral valve gradient is 5.0  mmHg.   Tricuspid Valve: The tricuspid valve is not well visualized. Tricuspid  valve regurgitation is not demonstrated. No evidence of tricuspid  stenosis.   Aortic Valve: AVA VTI 0.61, mean gradient 28 mmHg, DI 0.23, SVI 23. Severe  aortic stenosis with lower than expected gradient due to decreased stroke  volume index. The aortic valve is tricuspid. There is moderate  calcification of the aortic valve. There  is moderate thickening of the aortic valve. There is moderate aortic valve  annular calcification. Aortic valve regurgitation is not visualized.  Severe aortic stenosis is present. Aortic valve mean gradient measures  27.5 mmHg. Aortic valve peak gradient  measures 42.6 mmHg. Aortic valve area, by VTI measures 0.61 cm.   Pulmonic Valve: The pulmonic valve was not well visualized. Pulmonic valve  regurgitation is mild. No evidence of pulmonic stenosis.   Aorta: The aortic root and ascending aorta are structurally normal, with  no evidence of dilitation.   Venous: The inferior vena cava is dilated  in size with greater than 50%  respiratory variability, suggesting right atrial pressure of 8 mmHg.   IAS/Shunts: No atrial level shunt detected by color flow Doppler.     LEFT VENTRICLE  PLAX 2D  LVIDd:         4.40 cm   Diastology  LVIDs:         3.50 cm   LV e' lateral:   3.70 cm/s  LV PW:         1.50 cm   LV E/e' lateral: 45.4  LV IVS:        1.60 cm  LVOT diam:     1.80 cm  LV SV:         45  LV SV Index:   23  LVOT Area:     2.54 cm     RIGHT VENTRICLE            IVC  RV S prime:     9.46 cm/s  IVC diam: 2.00 cm  TAPSE (M-mode): 0.7 cm   LEFT ATRIUM           Index        RIGHT ATRIUM           Index  LA diam:      4.40  cm 2.28 cm/m   RA Area:     18.40 cm  LA Vol (A4C): 66.2 ml 34.32 ml/m  RA Volume:   54.80 ml  28.41 ml/m   AORTIC VALVE  AV Area (VTI):     0.61 cm  AV Vmax:           326.49 cm/s  AV Vmean:          249.200 cm/s  AV VTI:            0.735 m  AV Peak Grad:      42.6 mmHg  AV Mean Grad:      27.5 mmHg  LVOT VTI:          0.175 m  LVOT/AV VTI ratio: 0.24    AORTA  Ao Root diam: 2.60 cm  Ao Asc diam:  3.10 cm   MITRAL VALVE  MV Area (PHT): 2.51 cm     SHUNTS  MV Mean grad:  5.0 mmHg     Systemic VTI:  0.18 m  MV Decel Time: 302 msec     Systemic Diam: 1.80 cm  MV E velocity: 168.00 cm/s   Cardiac cath 04/21/24:    Prox RCA-1 lesion is 90% stenosed.   Prox RCA-2 lesion is 90% stenosed.   Mid RCA lesion is 90% stenosed.   RPDA lesion is 40% stenosed.   1st Mrg lesion is 95% stenosed.   Prox LAD to Mid LAD lesion is 50% stenosed.   Mid LAD lesion is 80% stenosed.   Severe three vessel CAD Moderate calcified stenosis throughout the proximal LAD. Severe mid LAD stenosis Severe calcified stenosis in the large caliber obtuse marginal branch Dominant RCA with severe calcified lesion in the proximal and mid vessel.  Elevated right heart pressures with wedge pressure 30-35 mmHg  Diagnostic Dominance: Right  Hemo Data  Flowsheet Row  Most Recent Value  Fick Cardiac Output 5.01 L/min  Fick Cardiac Output Index 2.62 (L/min)/BSA  RA A Wave 17 mmHg  RA V Wave 17 mmHg  RA Mean 16 mmHg  RV Systolic Pressure 52 mmHg  RV Diastolic Pressure 23 mmHg  RV EDP 16 mmHg  PA Systolic Pressure 51 mmHg  PA Diastolic Pressure 35 mmHg  PA Mean 38 mmHg  PW A Wave 37 mmHg  PW V Wave 39 mmHg  PW Mean 34 mmHg  AO Systolic Pressure 113 mmHg  AO Diastolic Pressure 68 mmHg  AO Mean 87 mmHg  LV Systolic Pressure 124 mmHg  LV Diastolic Pressure 13 mmHg  LV EDP 15 mmHg  Arterial Occlusion Pressure Extended Systolic Pressure 107 mmHg  Arterial Occlusion Pressure Extended Diastolic Pressure 74 mmHg  Arterial Occlusion Pressure Extended Mean Pressure 85 mmHg  Left Ventricular Apex Extended Systolic Pressure 124 mmHg  LVp Diastolic Pressure 23 mmHg  Left Ventricular Apex Extended EDP Pressure 22 mmHg  QP/QS 1  TPVR Index 14.52 HRUI  TSVR Index 33.25 HRUI  PVR SVR Ratio 0.06  TPVR/TSVR Ratio 0.44    Cardiac CT 04/24/24: FINDINGS: Aortic Valve:   Tricuspid aortic valve with severely reduced cusp excursion. Severely thickened and severely calcified aortic valve cusps.   AV calcium score: 1633   Virtual Basal Annulus Measurements:   Maximum/Minimum Diameter: 25.9 x 19.2 mm   Perimeter: 70.9 mm   Area:  383 mm2   Bulky calcification below right coronary cusp external to LVOT.   Membranous septal length: 11 mm   Based on these measurements, the annulus would be  suitable for a 23 mm Sapien 3 valve. Alternatively, Heart Team can consider 26 mm Evolut valve. Recommend Heart Team discussion for valve selection.   Sinus of Valsalva Measurements:   Non-coronary:  30 mm   Right - coronary:  29 mm   Left - coronary:  31 mm   Coronary height and sinus of Valsalva Height:   Left main: 13.7 mm, Left sinus: 19.4 mm   Right coronary: 16.6 mm, Right sinus: 20.1 mm   Aorta: Conventional 3 vessel branch pattern of aortic arch.    Sinotubular Junction:  27 mm   Ascending Thoracic Aorta:  33 mm   Aortic Arch:  26 mm   Descending Thoracic Aorta:  24 mm   Coronary Arteries: Normal coronary origin. Right dominance. The study was performed without use of NTG and insufficient for plaque evaluation.   Optimum Fluoroscopic Angle for Delivery: LAO 12 CAU 9   OTHER:   Atria: Biatrial dilation   Left atrial appendage: Watchman device well seated in LAA, no evidence of peridevice leak, no device related thrombus.   Mitral valve:  moderate mitral annular calcifications.   Pulmonary artery: Mild dilation of main pulmonary artery, 31 mm.   Pulmonary veins: Normal anatomy.   IMPRESSION: 1. 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 Severely thickened and severely calcified aortic valve cusps. 2. Aortic valve calcium score: 1633 3. Annulus area: 383 mm2, suitable for 23 mm Sapien 3 valve. No protruding LVOT calcifications, calcifications are external to annulus. Membranous septal length 11 mm. 4. Sufficient coronary artery heights from annulus. 5. Optimum fluoroscopic angle for delivery:  LAO 12 CAU 9  CT chest/abd/pelvis 04/24/24: AORTA: Moderate scattered calcified plaque throughout the thoracic aorta. Extensive calcified plaque through the abdominal aorta without an aneurysm. No dissection.   PULMONARY ARTERIES: No pulmonary embolism with the limits of this exam.   GREAT VESSELS OF AORTIC ARCH: 3 vessel brachiocephalic arterial origin anatomy without proximal stenosis. No dissection. No arterial occlusion or significant stenosis.   CELIAC TRUNK: osteal calcified plaque without high grade stenosis. No occlusion or significant stenosis.   SUPERIOR MESENTERIC ARTERY: origin calcified plaque extending over the length of at least 3 cm resulting in at least moderate stenosis, atheromatous but patent distally.   INFERIOR MESENTERIC ARTERY: Calcified plaque at the origin which remains  patent. No occlusion or significant stenosis.   RENAL ARTERIES: Single left renal artery with fairly heavily calcified ostial plaque resulting in stenosis of at least moderate severity. Duplicated right renal arteries, superior dominant with heavily calcified ostial plaque resulting in stenosis of at least moderate severity.   ILIAC ARTERIES: Scattered calcified plaque throughout bilateral common and internal iliac arteries without high grade stenosis or aneurysm. Mild tortuosity of the iliac arterial system bilaterally. Heavily calcified plaque in the right common femoral artery resulting in short segment stenosis of mild to moderate severity. Heavily calcified plaque in the proximal visualized aspect of bilateral superficial femoral arteries.   CHEST:   MEDIASTINUM: Occluder device in the left atrial appendage. Mitral annulus calcifications. Coronary leaflet calcifications. Extensive 3-vessel coronary calcifications. No mediastinal lymphadenopathy. The heart and pericardium demonstrate no acute abnormality.   LUNGS AND PLEURA: Small left and moderate right pleural effusions, new since previous. Dependent atelectasis/consolidation posteriorly in the lung bases. No pneumothorax.   THORACIC BONES AND SOFT TISSUES: No acute bone or soft tissue abnormality.   ABDOMEN AND PELVIS:   LIVER: Mildly nodular hepatic contour suggesting cirrhosis.   GALLBLADDER AND BILE DUCTS: Gallbladder is  unremarkable. No biliary ductal dilatation.   SPLEEN: The spleen is unremarkable.   PANCREAS: The pancreas is unremarkable.   ADRENAL GLANDS: Bilateral adrenal glands demonstrate no acute abnormality.   KIDNEYS, URETERS AND BLADDER: No stones in the kidneys or ureters. No hydronephrosis. No perinephric or periureteral stranding. Urinary bladder is unremarkable.   GI AND BOWEL: Stomach and duodenal sweep demonstrate no acute abnormality. There is no bowel obstruction. No abnormal bowel  wall thickening or distension.   REPRODUCTIVE: Reproductive organs are unremarkable.   PERITONEUM AND RETROPERITONEUM: No ascites or free air.   LYMPH NODES: No lymphadenopathy.   ABDOMINAL BONES AND SOFT TISSUES: multilevel spondylotic change through the lumbar spine. Mild L1 vertebral compression deformity with less than 20% loss of height, age indeterminate. Mild grade 1 anterolisthesis L4-L5 probably related to bilateral facet DJD. No acute soft tissue abnormality.   IMPRESSION: 1. SMA origin calcified plaque extending over at least 3 cm resulting in at least moderate stenosis, atheromatous but patent distally. 2. Single left renal artery with heavily calcified plaque resulting in at least moderate stenosis. 3. Duplicated right renal arteries, superior dominant, with heavily calcified plaque resulting in at least moderate stenosis. 4. Heavily calcified plaque in the right common femoral artery causing a short segment stenosis of mild to moderate severity. 5. Heavily calcified plaque in the proximal superficial femoral arteries bilaterally. Correlate with ABIs.  Recent Labs: 04/03/2024: B Natriuretic Peptide 558.5 04/19/2024: Pro Brain Natriuretic Peptide 6,770.0 04/21/2024: TSH 1.179 04/22/2024: ALT 15; Hemoglobin 11.6; Platelets 258 04/23/2024: Magnesium  2.0 04/25/2024: BUN 30; Creatinine, Ser 0.87; Potassium 3.6; Sodium 139    Wt Readings from Last 3 Encounters:  05/04/24 190 lb (86.2 kg)  04/26/24 189 lb (85.7 kg)  04/25/24 194 lb 12.8 oz (88.4 kg)    Assessment and Plan:   1. Severe Aortic Valve Stenosis: She has severe low flow/low gradient aortic stenosis. NYHA class 2 symptoms. I have personally reviewed the echo images. The aortic valve is thickened and calcified with limited leaflet mobility. I think she would benefit from AVR. As above, after formal CT surgery consultation, she is not felt to be a good candidate for conventional AVR by surgical approach with  bypass given heavily calcified ascending aorta. I think she will be a candidate for TAVR.   I have reviewed the natural history of aortic stenosis with the patient and their family members  who are present today. We have discussed the limitations of medical therapy and the poor prognosis associated with symptomatic aortic stenosis. We have reviewed potential treatment options, including palliative medical therapy, conventional surgical aortic valve replacement, and transcatheter aortic valve replacement. We discussed treatment options in the context of the patient's specific comorbid medical conditions.   She would like to proceed with TAVR. Risks and benefits of the valve procedure are reviewed with the patient. We will address her CAD first (see below).   2. CAD with angina: Severe heavily calcified RCA stenoses in the proximal and mid vessel with moderate OM and LAD disease. Will plan PCI of the RCA at [facility] 05/17/24. Treatment of the RCA will require orbital atherectomy and stenting. This procedure is very high risk but is indicated. I have referred her to CT surgery to discuss CABG but this is not felt to be an option. She is here today with her nephew's wife. The pt and family understand the procedure is high risk.    I have reviewed the risks, indications, and alternatives to cardiac catheterization, possible angioplasty, and  stenting with the patient. Risks include but are not limited to bleeding, infection, vascular injury, stroke, myocardial infection, arrhythmia, kidney injury, radiation-related injury in the case of prolonged fluoroscopy use, emergency cardiac surgery, and death. The patient understands the risks of serious complication is 1-2 in 1000 with diagnostic cardiac cath and 1-2% or less with angioplasty/stenting.  -BMET and CBC today  -Continue Plavix , Zetia  and Pravachol . Start ASA today  3. Atrial fibrillation, paroxysmal: Sinus brady today. Continue amiodarone , metoprolol .  Watchman in place.   4. Cardiomyopathy/Chronic systolic CHF: Wt is stable. Clinically much improved. No volume overload. Continue Lasix .    Labs/ tests ordered today include:  Orders Placed This Encounter  Procedures   Basic Metabolic Panel (BMET)   CBC   EKG 12-Lead   Disposition:   F/U will be arranged with the structural team  Signed, Lonni Cash, MD, [facility] 05/04/2024 3:29 PM    [facility] [address] [place], [place], [place]  [address] Phone: [phone]; Fax: [phone]

## 2024-05-04 NOTE — Patient Instructions (Addendum)
 Medication Instructions:  Your physician has recommended you make the following change in your medication: Start aspirin  81 mg by mouth daily    *If you need a refill on your cardiac medications before your next appointment, please call your pharmacy*  Lab Work: Have lab work drawn today in the lab on the first floor--BMP and CBC If you have labs (blood work) drawn today and your tests are completely normal, you will receive your results only by: MyChart Message (if you have MyChart) OR A paper copy in the mail If you have any lab test that is abnormal or we need to change your treatment, we will call you to review the results.  Testing/Procedures: Your physician has requested that you have a cardiac catheterization. Cardiac catheterization is used to diagnose and/or treat various heart conditions. Doctors may recommend this procedure for a number of different reasons. The most common reason is to evaluate chest pain. Chest pain can be a symptom of coronary artery disease (CAD), and cardiac catheterization can show whether plaque is narrowing or blocking your heart's arteries. This procedure is also used to evaluate the valves, as well as measure the blood flow and oxygen levels in different parts of your heart. For further information please visit [email or web address] Please follow instruction sheet, as given. Scheduled for 05/17/24  Follow-Up: At [facility], you and your health needs are our priority.  As part of our continuing mission to provide you with exceptional heart care, our providers are all part of one team.  This team includes your primary Cardiologist (physician) and Advanced Practice Providers or APPs (Physician Assistants and Nurse Practitioners) who all work together to provide you with the care you need, when you need it.  Your next appointment:   To be arranged after procedure  Provider:   Lonni Cash, MD    We recommend signing up for the patient  portal called MyChart.  Sign up information is provided on this After Visit Summary.  MyChart is used to connect with patients for Virtual Visits (Telemedicine).  Patients are able to view lab/test results, encounter notes, upcoming appointments, etc.  Non-urgent messages can be sent to your provider as well.   To learn more about what you can do with MyChart, go to [email or web address].   Other Instructions      [facility] A DEPT OF [place] AT [address] A DEPT OF THE [place]. [facility] [address] [place] [address] Dept: [phone] Loc: [phone]  Emily [name]  05/04/2024  You are scheduled for a Cardiac Catheterization on Wednesday, December 17 with Dr. Lonni Cash.  1. Please arrive at the [facility] (Main Entrance A) at [facility]: [address] [place], [place] [address] at 6:30 AM (This time is 2 hour(s) before your procedure to ensure your preparation).   Free valet parking service is available. You will check in at ADMITTING. The support person will be asked to wait in the waiting room.  It is OK to have someone drop you off and come back when you are ready to be discharged.    Special note: Every effort is made to have your procedure done on time. Please understand that emergencies sometimes delay scheduled procedures.  2. Diet: Nothing to eat after midnight.   3. Hydration: You need to be well hydrated before your procedure. On December 17, you may drink approved liquids (see below) until 2 hours before the procedure, with  16 oz of water  as your last intake.   List of approved liquids water , clear juice, clear tea, black coffee, fruit juices, non-citric and without pulp, carbonated beverages, Gatorade, Kool -Aid, plain Jello-O and plain ice popsicles.  4. Labs: done on 12/4  5. Medication instructions in preparation for your procedure: Do not take metformin  the morning of the procedure  and for 48 hours after procedure Do not take furosemide  and spironolactone  the day of the procedure Do not take semaglutide  the day of the procedure   Contrast Allergy: No   On the morning of your procedure, take your Aspirin  81 mg and Plavix /Clopidogrel  and any morning medicines NOT listed above.  You may use sips of water .  6. Plan to go home the same day, you will only stay overnight if medically necessary. 7. Bring a current list of your medications and current insurance cards. 8. You MUST have a responsible person to drive you home. 9. Someone MUST be with you the first 24 hours after you arrive home or your discharge will be delayed. 10. Please wear clothes that are easy to get on and off and wear slip-on shoes.  Thank you for allowing us  to care for you!   -- [facility]

## 2024-05-05 ENCOUNTER — Ambulatory Visit: Admitting: Nurse Practitioner

## 2024-05-05 ENCOUNTER — Encounter: Payer: Self-pay | Admitting: Nurse Practitioner

## 2024-05-05 VITALS — BP 104/43 | HR 78 | Temp 96.8°F | Ht 62.0 in | Wt 188.0 lb

## 2024-05-05 DIAGNOSIS — I480 Paroxysmal atrial fibrillation: Secondary | ICD-10-CM | POA: Diagnosis not present

## 2024-05-05 DIAGNOSIS — E538 Deficiency of other specified B group vitamins: Secondary | ICD-10-CM | POA: Diagnosis not present

## 2024-05-05 DIAGNOSIS — I350 Nonrheumatic aortic (valve) stenosis: Secondary | ICD-10-CM | POA: Diagnosis not present

## 2024-05-05 DIAGNOSIS — Z789 Other specified health status: Secondary | ICD-10-CM | POA: Diagnosis not present

## 2024-05-05 LAB — BASIC METABOLIC PANEL WITH GFR
BUN/Creatinine Ratio: 21 (ref 12–28)
BUN: 26 mg/dL (ref 8–27)
CO2: 25 mmol/L (ref 20–29)
Calcium: 9.7 mg/dL (ref 8.7–10.3)
Chloride: 97 mmol/L (ref 96–106)
Creatinine, Ser: 1.21 mg/dL — AB (ref 0.57–1.00)
Glucose: 116 mg/dL — AB (ref 70–99)
Potassium: 4.8 mmol/L (ref 3.5–5.2)
Sodium: 138 mmol/L (ref 134–144)
eGFR: 47 mL/min/1.73 — AB (ref >59)

## 2024-05-05 LAB — CBC
Hematocrit: 36.2 % (ref 34.0–46.6)
Hemoglobin: 11.9 g/dL (ref 11.1–15.9)
MCH: 30.3 pg (ref 26.6–33.0)
MCHC: 32.9 g/dL (ref 31.5–35.7)
MCV: 92 fL (ref 79–97)
Platelets: 278 x10E3/uL (ref 150–450)
RBC: 3.93 x10E6/uL (ref 3.77–5.28)
RDW: 14.6 % (ref 11.7–15.4)
WBC: 7 x10E3/uL (ref 3.4–10.8)

## 2024-05-05 NOTE — Progress Notes (Signed)
 Subjective:    Patient ID: Emily [name], female    DOB: 1950-09-30, 73 y.o.   MRN: [id number]   Chief Complaint: hospital follow up  HPI Today's visit was for Transitional Care Management.  The patient was discharged from [facility] on 04/25/24 with a primary diagnosis of paroxsymal atrial fib.   Contact with the patient and/or caregiver, by a clinical staff member, was made on 04/26/24 and was documented as a telephone encounter within the EMR.  Through chart review and discussion with the patient I have determined that management of their condition is of moderate complexity.       Patient went to the hospital on 04/17/24 with paroxsymal atrial fib. She was there for several days. Was discharged  on 04/25/24. A-fib/flutter with RVR - patient was found to be in A-fib with RVR on admission.  Cardiology consulted and followed patient while hospitalized.  She was placed on a combination of high-dose amiodarone  as well as metoprolol , and eventually converted to sinus rhythm.  She has remained in sinus, discussed with cardiology, will be discharged home in stable condition on metoprolol  XL along with amiodarone  400 mg BID up until 11/28 and 200 mg BID starting 11/29.  She will be followed up with EP as an outpatient. She had cardiac cath 04/21/24 which showed aortic stenosis.  Since going home she has been dong well. No c/o palpitations or heart racing. She had an appointment with cardiology 05/04/24, at which time they reviewed her cath results. She is scheduled for stent placement procedure on 05/17/24. Patient Active Problem List   Diagnosis Date Noted   Coronary artery disease 04/24/2024   Statin myopathy 04/18/2024   Paroxysmal atrial fibrillation with RVR (HCC) 04/17/2024   Leg swelling 04/06/2024   PAF (paroxysmal atrial fibrillation) (HCC) 04/04/2024   Bradycardia 01/20/2024   Mild mitral stenosis 05/06/2023   History of GI bleed 05/06/2023   Severe aortic stenosis 05/06/2023    Long term current use of antiarrhythmic drug 05/06/2023   Paroxysmal atrial fibrillation (HCC) 05/06/2023   Atrial fibrillation with RVR (HCC) 05/06/2023   Carotid artery stenosis 03/19/2023   Nonrheumatic aortic valve stenosis 03/19/2020   OSA on CPAP 09/25/2019   History of adenomatous polyp of colon 08/30/2019   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 device 07/16/2018   Chronic anticoagulation 03/03/2018   Restless leg syndrome 02/16/2018   Chronic left-sided low back pain with left-sided sciatica 02/16/2018   Morbid obesity (HCC) 02/14/2018   Bruit 02/14/2018   DDD (degenerative disc disease), lumbar 12/07/2017   Peripheral arterial disease 07/06/2017   Type 2 diabetes mellitus with hyperlipidemia (HCC) 06/29/2017   Iron  deficiency anemia 05/27/2017   Vitamin B12 deficiency 05/27/2017   Pain in joint, ankle and foot 06/03/2016   Bilateral carotid artery disease 07/05/2015   Multiple gastric polyps 07/03/2014   Diabetes mellitus treated with injections of non-insulin  medication (HCC)    Hypertension associated with diabetes (HCC) 10/27/2013   Hypothyroidism due to acquired atrophy of thyroid  10/27/2013   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  Eyes:  Negative for pain.  Respiratory:  Negative for shortness of breath.   Cardiovascular:  Negative for chest pain, palpitations and leg swelling.  Gastrointestinal:  Negative for abdominal pain.  Endocrine: Negative for polydipsia.  Skin:  Negative for rash.  Neurological:  Negative for dizziness, weakness and headaches.  Hematological:  Does not bruise/bleed easily.  All other systems reviewed and are negative.      Objective:   Physical Exam Constitutional:      Appearance: Normal appearance.  Cardiovascular:     Rate and Rhythm: Normal rate and regular rhythm.     Heart sounds: Murmur (3/6 systolic murmur) heard.  Skin:     General: Skin is warm.  Neurological:     General: No focal deficit present.     Mental Status: She is alert and oriented to person, place, and time.  Psychiatric:        Mood and Affect: Mood normal.        Behavior: Behavior normal.    BP (!) 104/43   Pulse 78   Temp (!) 96.8 F (36 C) (Temporal)   Ht 5' 2 (1.575 m)   Wt 188 lb (85.3 kg)   BMI 34.39 kg/m         Assessment & Plan:   Daliya Morrissette in today with chief complaint of Transitions Of Care   1. Transition of care (Primary) Reviewed hospital note  2. Paroxysmal atrial fibrillation (HCC) Report and palpitations or heart racing Avoid caffeine  3. Nonrheumatic aortic valve stenosis Keep appointment for stent placement scheduled for May 17, 2024  B12 injection today    The above assessment and management plan was discussed with the patient. The patient verbalized understanding of and has agreed to the management plan. Patient is aware to call the clinic if symptoms persist or worsen. Patient is aware when to return to the clinic for a follow-up visit. Patient educated on when it is appropriate to go to the emergency department.   Mary-Margaret Gladis, FNP

## 2024-05-08 ENCOUNTER — Other Ambulatory Visit: Payer: Self-pay | Admitting: *Deleted

## 2024-05-08 ENCOUNTER — Ambulatory Visit: Payer: Self-pay | Admitting: Cardiovascular Disease

## 2024-05-08 NOTE — Patient Outreach (Signed)
 Transition of Care week 2  Visit Note  05/08/2024  Name: Emily [name] MRN: [id number]          DOB: [birth year]  Situation: Patient enrolled in [facility] 30-day program. Visit completed with patient by telephone.   Background:  Discharge Date and Diagnosis: 04/25/24, Paroxysmal Atrial Fibrillation   Past Medical History:  Diagnosis Date   Anemia    Aortic stenosis    Arthritis    Bilateral carotid artery disease    CAD (coronary artery disease)    Complication of anesthesia    difficult intubation in past   Diabetes mellitus without complication (HCC)    type 2   Difficult intubation    states 'lady that did the sleep study told me I have the smallest airway she has ever seen in an adult   Dyslipidemia 06/29/2017   HFrEF (heart failure with reduced ejection fraction) (HCC)    History of blood transfusion 09/2019   GI bleed - in [place]   Hypertension    Hypothyroid    Obesity    Obstructive sleep apnea    occasional uses CPAP   Peripheral vascular disease    Persistent atrial fibrillation (HCC) 01/16/2020   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 - xrays were negative   Upper GI bleed 09/25/2019    Assessment: Patient Reported Symptoms: Cognitive Cognitive Status: No symptoms reported, Alert and oriented to person, place, and time, Able to follow simple commands, Normal speech and language skills      Neurological Neurological Review of Symptoms: No symptoms reported    HEENT HEENT Symptoms Reported: No symptoms reported      Cardiovascular Cardiovascular Symptoms Reported: No symptoms reported Does patient have uncontrolled Hypertension?: No Weight: 185 lb (83.9 kg) Cardiovascular Self-Management Outcome: 3 (uncertain) Cardiovascular Comment: Reinforced atrial fibrillation action plan  Respiratory Respiratory Symptoms Reported: No symptoms reported    Endocrine Endocrine Symptoms Reported: No symptoms reported Is patient diabetic?: Yes Is  patient checking blood sugars at home?: Yes List most recent blood sugar readings, include date and time of day: FBS 147 Endocrine Self-Management Outcome: 4 (good)  Gastrointestinal Gastrointestinal Symptoms Reported: No symptoms reported      Genitourinary Genitourinary Symptoms Reported: No symptoms reported    Integumentary Integumentary Symptoms Reported: No symptoms reported    Musculoskeletal Musculoskelatal Symptoms Reviewed: No symptoms reported        Psychosocial Psychosocial Symptoms Reported: No symptoms reported         Today's Vitals   05/08/24 0911  Weight: 185 lb (83.9 kg)   Pain Scale: 0-10 Pain Score: 0-No pain  Medications Reviewed Today     Reviewed by Aura Mliss LABOR, RN (Registered Nurse) on 05/08/24 at [phone]  Med List Status: <None>   Medication Order Taking? Sig Documenting Provider Last Dose Status Informant  amiodarone  (PACERONE ) 200 MG tablet [id number]  Take 2 tablets (400 mg total) by mouth 2 (two) times daily for 4 days until 11/28, THEN on 11/29 Take 1 tablet (200 mg total) 2 (two) times daily. Gherghe, Costin M, MD  Active   aspirin  EC 81 MG tablet [id number]  Take 1 tablet (81 mg total) by mouth daily. Swallow whole. Verlin Lonni BIRCH, MD  Active   clopidogrel  (PLAVIX ) 75 MG tablet [id number]  TAKE 1 TABLET BY MOUTH EVERY DAY Bethanie Cough, PA-C  Active Self, Pharmacy Records  cyanocobalamin  ((VITAMIN B-12)) injection 1,000 mcg [id number]   Harl Jayson CROME, MD  Active   diphenhydramine -acetaminophen  (  TYLENOL  PM) 25-500 MG TABS tablet [id number]  Take 1 tablet by mouth at bedtime as needed (pain). [provider]  Active Self, Pharmacy Records  ezetimibe  (ZETIA ) 10 MG tablet [id number]  TAKE 1 TABLET BY MOUTH EVERY DAY Lavona Agent, MD  Active Self, Pharmacy Records  fluticasone  (FLONASE ) 50 MCG/ACT nasal spray [id number]  Place 2 sprays into both nostrils daily. Jolinda Norene HERO, DO  Active Self, Pharmacy Records  furosemide   (LASIX ) 40 MG tablet [id number]  Take 1 tablet (40 mg total) by mouth daily. Gherghe, Costin M, MD  Active   gabapentin  (NEURONTIN ) 300 MG capsule [id number]  TAKE 1 CAPSULE (300 MG TOTAL) BY MOUTH 2 (TWO) TIMES DAILY AS NEEDED (NEUROPATHY RIGHT LEG). Jolinda Norene HERO, DO  Active Self, Pharmacy Records  levonorgestrel [facility]) 20 MCG/DAY IUD [id number]  1 each by Intrauterine route once. [provider]  Active Self, Pharmacy Records  levothyroxine  (SYNTHROID ) 112 MCG tablet [id number]  TAKE 1 TABLET BY MOUTH EVERY OTHER DAY, ALTERNATING WITH 1 & 1/2 TABLETS EVERY OTHER DAY  Patient taking differently: Take 112 mcg by mouth See admin instructions. TAKE 1 TABLET BY MOUTH EVERY OTHER DAY, ALTERNATING WITH 1 & 1/2 TABLETS EVERY OTHER DAY   Gottschalk, Ashly M, DO  Active Self, Pharmacy Records           Med Note [place], CHUCK KANDICE Debar Apr 18, 2024  7:57 PM) Pt is unsure if she took 1 or 1.5 tabs yesterday morning  linaclotide  (LINZESS ) 290 MCG CAPS capsule [id number]  Take 1 capsule (290 mcg total) by mouth daily before breakfast. Jolinda Norene HERO, DO  Active Self, Pharmacy Records           Med Note [place], CHUCK KANDICE Debar Apr 18, 2024  7:53 PM) Taking samples from her Dr  losartan  (COZAAR ) 25 MG tablet [id number]  Take 1 tablet (25 mg total) by mouth daily. Jolinda Norene HERO, DO  Active Self, Pharmacy Records  metFORMIN  (GLUCOPHAGE ) 1000 MG tablet [id number]  Take 1 tablet (1,000 mg total) by mouth 2 (two) times daily with a meal.  Patient taking differently: Take 500 mg by mouth 2 (two) times daily with a meal. 500 mg BID   Jolinda Norene HERO, DO  Active Self, Pharmacy Records           Med Note JACKOLYN WADDELL DEL   Tue Apr 04, 2024  8:43 AM)    metoprolol  succinate (TOPROL -XL) 50 MG 24 hr tablet [id number]  Take 50 mg by mouth daily. Take with or immediately following a meal. [provider]  Active   polyethylene glycol (MIRALAX  / GLYCOLAX ) 17 g packet [id number]  Take 17 g by  mouth daily as needed for moderate constipation. [provider]  Active Self, Pharmacy Records  pravastatin  (PRAVACHOL ) 40 MG tablet [id number]  Take 1 tablet (40 mg total) by mouth daily at 6 PM. Gherghe, Costin M, MD  Active   rOPINIRole  (REQUIP ) 0.5 MG tablet [id number]  TAKE 1 TABLET BY MOUTH AT BEDTIME. Jolinda Norene HERO, DO  Active Self, Pharmacy Records  Semaglutide ,0.25 or 0.5MG /DOS, 2 MG/3ML SOPN [id number]  Inject 0.5 mg into the skin once a week. Jolinda Norene HERO, DO  Active Self, Pharmacy Records           Med Note SOILA LYLE BROCKS   Fri Mar 05, 2023 12:44 PM)    spironolactone  (ALDACTONE ) 25 MG tablet [id number]  Take 1 tablet (25 mg total) by mouth daily.  Trixie Nilda HERO, MD  Active             Goals Addressed             This Visit's Progress    VBCI Transitions of Care (TOC) Care Plan       Problems:  Recent Hospitalization for treatment of Atrial Fibrillation No Hospital Follow Up Provider appointment pt does not have post hospital follow up appointment Pt reports she is weighing daily, checking blood pressure and heart rate daily and keeping a log, pt denies dyspnea, has only slight edema in feet  Goal:  Over the next 30 days, the patient will not experience hospital readmission  Interventions: AFIB Interventions:  Counseled on increased risk of stroke due to Afib and benefits of anticoagulation for stroke prevention Reviewed importance of adherence to anticoagulant exactly as prescribed Counseled on importance of regular laboratory monitoring as prescribed Afib action plan reinforced Reinforced A-Fib action plan Encouraged pt to continue daily weights, BP, HR and keep a log, take log to all provider appointments  Patient Self Care Activities:  Attend all scheduled provider appointments Call pharmacy for medication refills 3-7 days in advance of running out of medications Call provider office for new concerns or questions  Notify RN Care  Manager of TOC call rescheduling needs Participate in Transition of Care Program/Attend TOC scheduled calls Perform all self care activities independently  Perform IADL's (shopping, preparing meals, housekeeping, managing finances) independently Take medications as prescribed   begin a symptom diary bring symptom diary to all appointments check pulse (heart) rate before taking medicine make a plan to eat healthy keep all lab appointments take medicine as prescribed wear medical alert identification Continue to weigh daily, check blood pressure, heart rate and keep a log, take log to all provider appointments Please check your heart rate before taking metoprolol   Plan:  Telephone follow up appointment with care management team member scheduled for:  05/15/24 @ 9 am The patient has been provided with contact information for the care management team and has been advised to call with any health related questions or concerns.         Recommendation:   PCP Follow-up  Follow Up Plan:   Telephone follow-up 05/15/24 @ 9 am  Mliss Creed [facility], BSN RN Care Manager/ Transition of Care [place] [phone]

## 2024-05-08 NOTE — Patient Instructions (Signed)
 Visit Information  Thank you for taking time to visit with me today. Please don't hesitate to contact me if I can be of assistance to you before our next scheduled telephone appointment.  Our next appointment is by telephone on 05/15/24 @ 9 am  Following is a copy of your care plan:   Goals Addressed             This Visit's Progress    VBCI Transitions of Care (TOC) Care Plan       Problems:  Recent Hospitalization for treatment of Atrial Fibrillation No Hospital Follow Up Provider appointment pt does not have post hospital follow up appointment Pt reports she is weighing daily, checking blood pressure and heart rate daily and keeping a log, pt denies dyspnea, has only slight edema in feet  Goal:  Over the next 30 days, the patient will not experience hospital readmission  Interventions: AFIB Interventions:  Counseled on increased risk of stroke due to Afib and benefits of anticoagulation for stroke prevention Reviewed importance of adherence to anticoagulant exactly as prescribed Counseled on importance of regular laboratory monitoring as prescribed Afib action plan reinforced Reinforced A-Fib action plan Encouraged pt to continue daily weights, BP, HR and keep a log, take log to all provider appointments  Patient Self Care Activities:  Attend all scheduled provider appointments Call pharmacy for medication refills 3-7 days in advance of running out of medications Call provider office for new concerns or questions  Notify RN Care Manager of TOC call rescheduling needs Participate in Transition of Care Program/Attend TOC scheduled calls Perform all self care activities independently  Perform IADL's (shopping, preparing meals, housekeeping, managing finances) independently Take medications as prescribed   begin a symptom diary bring symptom diary to all appointments check pulse (heart) rate before taking medicine make a plan to eat healthy keep all lab appointments take  medicine as prescribed wear medical alert identification Continue to weigh daily, check blood pressure, heart rate and keep a log, take log to all provider appointments Please check your heart rate before taking metoprolol   Plan:  Telephone follow up appointment with care management team member scheduled for:  05/15/24 @ 9 am The patient has been provided with contact information for the care management team and has been advised to call with any health related questions or concerns.         Care plan and visit instructions communicated with the patient verbally today. The patient DECLINED to receive copy of care plan and patient instructions in any format.   Telephone follow up appointment with care management team member scheduled for:  05/15/24 @ 9 am  Please call the care guide team at [phone] if you need to cancel or reschedule your appointment.   Please call the Suicide and Crisis Lifeline: 988 call the [place]  National Suicide Prevention Lifeline: 740-292-4997 or TTY: (817) 420-3743 TTY 681-028-6281) to talk to a trained counselor call 1-800-273-TALK (toll free, 24 hour hotline) go to [facility] [address] [place] [phone]) call the [facility] Crisis Line: [phone] call 911 if you are experiencing a Mental Health or Behavioral Health Crisis or need someone to talk to.  Mliss Creed [facility], BSN RN Care Manager/ Transition of Care [place]/ [facility] [phone]

## 2024-05-15 ENCOUNTER — Encounter: Payer: Self-pay | Admitting: *Deleted

## 2024-05-15 ENCOUNTER — Telehealth: Payer: Self-pay | Admitting: *Deleted

## 2024-05-15 ENCOUNTER — Other Ambulatory Visit: Payer: Self-pay | Admitting: *Deleted

## 2024-05-15 NOTE — Patient Instructions (Signed)
 Visit Information  Thank you for taking time to visit with me today. Please don't hesitate to contact me if I can be of assistance to you before our next scheduled telephone appointment.  Following is a copy of your care plan:   Goals Addressed             This Visit's Progress    COMPLETED: VBCI Transitions of Care (TOC) Care Plan       Problems:  Recent Hospitalization for treatment of Atrial Fibrillation No Hospital Follow Up Provider appointment pt does not have post hospital follow up appointment Pt reports she is weighing daily, checking blood pressure and heart rate daily and keeping a log, pt denies dyspnea, states  I feel the best I have in a long time  pt is scheduled for hospital procedure coronary stent intervention and states she was told she will be admitted  Goal:  Over the next 30 days, the patient will not experience hospital readmission  Interventions: AFIB Interventions:  Counseled on increased risk of stroke due to Afib and benefits of anticoagulation for stroke prevention Reviewed importance of adherence to anticoagulant exactly as prescribed Counseled on importance of regular laboratory monitoring as prescribed Afib action plan reinforced Reinforced A-Fib action plan Encouraged pt to continue daily weights, BP, HR and keep a log, take log to all provider appointments Reviewed plan of care with pt including case closure, pt aware that upon discharge home, new transition of care can be completed  Patient Self Care Activities:  Attend all scheduled provider appointments Call pharmacy for medication refills 3-7 days in advance of running out of medications Call provider office for new concerns or questions  Notify RN Care Manager of TOC call rescheduling needs Participate in Transition of Care Program/Attend TOC scheduled calls Perform all self care activities independently  Perform IADL's (shopping, preparing meals, housekeeping, managing finances)  independently Take medications as prescribed   begin a symptom diary bring symptom diary to all appointments check pulse (heart) rate before taking medicine make a plan to eat healthy keep all lab appointments take medicine as prescribed wear medical alert identification Continue to weigh daily, check blood pressure, heart rate and keep a log, take log to all provider appointments Please check your heart rate before taking metoprolol   Plan:  Case closure, pt will be admitted on 12/17 for coronary artery stent intervention        Care plan and visit instructions communicated with the patient verbally today. The patient DECLINED to receive copy of care plan and patient instructions in any format.  No further follow up required: case closure  Please call the care guide team at [phone] if you need to cancel or reschedule your appointment.   Please call the Suicide and Crisis Lifeline: 988 call the [place]  National Suicide Prevention Lifeline: 435-211-7029 or TTY: 657-635-1575 TTY 563-490-7094) to talk to a trained counselor call 1-800-273-TALK (toll free, 24 hour hotline) go to [facility] [address] [place] [phone]) call the [facility] Crisis Line: 805-795-1493 call 911 if you are experiencing a Mental Health or Behavioral Health Crisis or need someone to talk to.  Mliss Creed [facility], BSN RN Care Manager/ Transition of Care [place]/ [facility] [phone]

## 2024-05-15 NOTE — Patient Outreach (Signed)
 Transition of Care week 3  Visit Note  05/15/2024  Name: Emily [name] MRN: [id number]          DOB: 04/14/1951  Situation: Patient enrolled in [facility] 30-day program. Visit completed with patient by telephone.   Background:  Discharge Date and Diagnosis: 04/25/24, Paroxysmal Atrial Fibrillation   Past Medical History:  Diagnosis Date   Anemia    Aortic stenosis    Arthritis    Bilateral carotid artery disease    CAD (coronary artery disease)    Complication of anesthesia    difficult intubation in past   Diabetes mellitus without complication (HCC)    type 2   Difficult intubation    states 'lady that did the sleep study told me I have the smallest airway she has ever seen in an adult   Dyslipidemia 06/29/2017   HFrEF (heart failure with reduced ejection fraction) (HCC)    History of blood transfusion 09/2019   GI bleed - in [place]   Hypertension    Hypothyroid    Obesity    Obstructive sleep apnea    occasional uses CPAP   Peripheral vascular disease    Persistent atrial fibrillation (HCC) 01/16/2020   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 - xrays were negative   Upper GI bleed 09/25/2019    Assessment: Patient Reported Symptoms: Cognitive Cognitive Status: No symptoms reported, Alert and oriented to person, place, and time, Able to follow simple commands, Normal speech and language skills      Neurological Neurological Review of Symptoms: No symptoms reported    HEENT HEENT Symptoms Reported: No symptoms reported      Cardiovascular Cardiovascular Symptoms Reported: No symptoms reported Does patient have uncontrolled Hypertension?: No Weight: 184 lb (83.5 kg) Cardiovascular Self-Management Outcome: 3 (uncertain) Cardiovascular Comment: reinforced atrial fibrillation action plan  Respiratory Respiratory Symptoms Reported: No symptoms reported    Endocrine Endocrine Symptoms Reported: No symptoms reported Is patient diabetic?: Yes Is  patient checking blood sugars at home?: Yes List most recent blood sugar readings, include date and time of day: 122-160 FBS ranges Endocrine Self-Management Outcome: 4 (good)  Gastrointestinal Gastrointestinal Symptoms Reported: No symptoms reported      Genitourinary Genitourinary Symptoms Reported: No symptoms reported    Integumentary Integumentary Symptoms Reported: No symptoms reported    Musculoskeletal Musculoskelatal Symptoms Reviewed: No symptoms reported        Psychosocial Psychosocial Symptoms Reported: No symptoms reported         Today's Vitals   05/15/24 1517  Weight: 184 lb (83.5 kg)   Pain Scale: 0-10 Pain Score: 0-No pain  Medications Reviewed Today     Reviewed by Aura Mliss LABOR, RN (Registered Nurse) on 05/15/24 at 1519  Med List Status: <None>   Medication Order Taking? Sig Documenting Provider Last Dose Status Informant  amiodarone  (PACERONE ) 200 MG tablet [id number]  Take 2 tablets (400 mg total) by mouth 2 (two) times daily for 4 days until 11/28, THEN on 11/29 Take 1 tablet (200 mg total) 2 (two) times daily.  Patient taking differently: Take 1 tablet (200 mg total) 2 (two) times daily.   Trixie Nilda HERO, MD  Active Self, Pharmacy Records  aspirin  EC 81 MG tablet [id number]  Take 1 tablet (81 mg total) by mouth daily. Swallow whole. Verlin Lonni BIRCH, MD  Active Self, Pharmacy Records  clopidogrel  (PLAVIX ) 75 MG tablet [id number]  TAKE 1 TABLET BY MOUTH EVERY DAY Bethanie Cough, PA-C  Active Self,  Pharmacy Records  cyanocobalamin  ((VITAMIN B-12)) injection 1,000 mcg [id number]   Harl Jayson CROME, MD  Active   diphenhydramine -acetaminophen  (TYLENOL  PM) 25-500 MG TABS tablet [id number]  Take 1 tablet by mouth at bedtime as needed (pain). [provider]  Active Self, Pharmacy Records  ezetimibe  (ZETIA ) 10 MG tablet [id number]  TAKE 1 TABLET BY MOUTH EVERY DAY Lynwood Schilling, MD  Active Self, Pharmacy Records  fluticasone  (FLONASE ) 50  MCG/ACT nasal spray [id number] No Place 2 sprays into both nostrils daily.  Patient not taking: Reported on 05/12/2024   Jolinda Norene HERO, DO Not Taking Active Self, Pharmacy Records  furosemide  (LASIX ) 40 MG tablet [id number]  Take 1 tablet (40 mg total) by mouth daily. Gherghe, Costin M, MD  Active Self, Pharmacy Records  gabapentin  (NEURONTIN ) 300 MG capsule [id number]  TAKE 1 CAPSULE (300 MG TOTAL) BY MOUTH 2 (TWO) TIMES DAILY AS NEEDED (NEUROPATHY RIGHT LEG). Jolinda Norene HERO, DO  Active Self, Pharmacy Records  levonorgestrel [facility]) 20 MCG/DAY IUD [id number]  1 each by Intrauterine route once. [provider]  Active Self, Pharmacy Records  levothyroxine  (SYNTHROID ) 112 MCG tablet [id number]  TAKE 1 TABLET BY MOUTH EVERY OTHER DAY, ALTERNATING WITH 1 & 1/2 TABLETS EVERY OTHER DAY  Patient taking differently: Take 112 mcg by mouth See admin instructions. TAKE 1 TABLET BY MOUTH EVERY OTHER DAY, ALTERNATING WITH 1 & 1/2 TABLETS EVERY OTHER DAY   Gottschalk, Ashly M, DO  Active Self, Pharmacy Records           Med Note [facility], ALI   Fri May 12, 2024  4:02 PM)    linaclotide  (LINZESS ) 290 MCG CAPS capsule [id number] No Take 1 capsule (290 mcg total) by mouth daily before breakfast.  Patient not taking: Reported on 05/12/2024   Jolinda Norene HERO, DO Not Taking Active Self, Pharmacy Records           Med Note [facility], ALI   Fri May 12, 2024  4:02 PM)    losartan  (COZAAR ) 25 MG tablet [id number]  Take 1 tablet (25 mg total) by mouth daily. Jolinda Norene HERO, DO  Active Self, Pharmacy Records  metFORMIN  (GLUCOPHAGE ) 1000 MG tablet [id number]  Take 1 tablet (1,000 mg total) by mouth 2 (two) times daily with a meal.  Patient taking differently: Take 500 mg by mouth 2 (two) times daily with a meal. 500 mg BID   Jolinda Norene HERO, DO  Active Self, Pharmacy Records           Med Note JACKOLYN WADDELL DEL   Tue Apr 04, 2024  8:43 AM)    metoprolol  succinate (TOPROL -XL) 50 MG 24 hr tablet  [id number]  Take 50 mg by mouth at bedtime. [provider]  Active Self, Pharmacy Records  polyethylene glycol (MIRALAX  / GLYCOLAX ) 17 g packet [id number]  Take 17 g by mouth daily as needed for moderate constipation. [provider]  Active Self, Pharmacy Records  pravastatin  (PRAVACHOL ) 40 MG tablet [id number]  Take 1 tablet (40 mg total) by mouth daily at 6 PM. Gherghe, Costin M, MD  Active Self, Pharmacy Records  rOPINIRole  (REQUIP ) 0.5 MG tablet [id number]  TAKE 1 TABLET BY MOUTH AT BEDTIME. Jolinda Norene HERO, DO  Active Self, Pharmacy Records  Semaglutide ,0.25 or 0.5MG /DOS, 2 MG/3ML SOPN [id number]  Inject 0.5 mg into the skin once a week.  Patient taking differently: Inject 0.5 mg into the skin once a week. Wed   Jolinda Norene HERO, DO  Active Self, Pharmacy Records  Med Note SOILA LYLE BROCKS   Fri Mar 05, 2023 12:44 PM)    spironolactone  (ALDACTONE ) 25 MG tablet [id number]  Take 1 tablet (25 mg total) by mouth daily. Trixie Nilda HERO, MD  Active Self, Pharmacy Records            Goals Addressed             This Visit's Progress    COMPLETED: VBCI Transitions of Care (TOC) Care Plan       Problems:  Recent Hospitalization for treatment of Atrial Fibrillation No Hospital Follow Up Provider appointment pt does not have post hospital follow up appointment Pt reports she is weighing daily, checking blood pressure and heart rate daily and keeping a log, pt denies dyspnea, states  I feel the best I have in a long time  pt is scheduled for hospital procedure coronary stent intervention and states she was told she will be admitted  Goal:  Over the next 30 days, the patient will not experience hospital readmission  Interventions: AFIB Interventions:  Counseled on increased risk of stroke due to Afib and benefits of anticoagulation for stroke prevention Reviewed importance of adherence to anticoagulant exactly as prescribed Counseled on importance of  regular laboratory monitoring as prescribed Afib action plan reinforced Reinforced A-Fib action plan Encouraged pt to continue daily weights, BP, HR and keep a log, take log to all provider appointments Reviewed plan of care with pt including case closure, pt aware that upon discharge home, new transition of care can be completed  Patient Self Care Activities:  Attend all scheduled provider appointments Call pharmacy for medication refills 3-7 days in advance of running out of medications Call provider office for new concerns or questions  Notify RN Care Manager of TOC call rescheduling needs Participate in Transition of Care Program/Attend TOC scheduled calls Perform all self care activities independently  Perform IADL's (shopping, preparing meals, housekeeping, managing finances) independently Take medications as prescribed   begin a symptom diary bring symptom diary to all appointments check pulse (heart) rate before taking medicine make a plan to eat healthy keep all lab appointments take medicine as prescribed wear medical alert identification Continue to weigh daily, check blood pressure, heart rate and keep a log, take log to all provider appointments Please check your heart rate before taking metoprolol   Plan:  Case closure, pt will be admitted on 12/17 for coronary artery stent intervention        Recommendation:   PCP Follow-up  Follow Up Plan:   Closing From:  Transitions of Care Program  Mliss Creed [facility], BSN RN Care Manager/ Transition of Care [place]/ [facility] Population [facility] [phone]

## 2024-05-15 NOTE — Telephone Encounter (Signed)
 Coronary Stent scheduled at [facility] for: Wednesday May 17, 2024 8:30 AM Arrival time [facility] Main Entrance A at: 6:30 AM  Diet: -Nothing to eat after midnight.  Hydration: -May drink clear liquids until 2 hours before the procedure.  Approved liquids: Water , clear tea, black coffee, fruit juices-non-citric and without pulp,Gatorade, plain Jello/popsicles.   -Please drink 16 oz of water  2 hours before procedure.  Medication instructions: -Hold:  Lasix /Spironolactone /Losartan -day before and day of procedure-per protocol GFR < 60 (47)  Metformin -day of procedure and 48 hours post procedure  Ozempic -weekly on Wednesdays -will hold until post procedure. -Other usual morning medications can be taken including aspirin  81 mg and Plavix  75 mg.  Plan to go home the same day, you will only stay overnight if medically necessary.  You must have responsible adult to drive you home.  Someone must be with you the first 24 hours after you arrive home.  Reviewed procedure instructions with patient.

## 2024-05-17 ENCOUNTER — Inpatient Hospital Stay (HOSPITAL_COMMUNITY)
Admission: AD | Admit: 2024-05-17 | Discharge: 2024-05-19 | DRG: 324 | Disposition: A | Discharge status: Home / Self Care | Attending: Cardiovascular Disease | Admitting: Cardiovascular Disease

## 2024-05-17 ENCOUNTER — Encounter (HOSPITAL_COMMUNITY): Admission: RE | Disposition: A | Payer: Self-pay | Attending: Cardiovascular Disease

## 2024-05-17 ENCOUNTER — Encounter (HOSPITAL_COMMUNITY): Payer: Self-pay | Admitting: Cardiovascular Disease

## 2024-05-17 ENCOUNTER — Other Ambulatory Visit: Payer: Self-pay

## 2024-05-17 DIAGNOSIS — Z8249 Family history of ischemic heart disease and other diseases of the circulatory system: Secondary | ICD-10-CM | POA: Diagnosis not present

## 2024-05-17 DIAGNOSIS — I10 Essential (primary) hypertension: Secondary | ICD-10-CM | POA: Diagnosis present

## 2024-05-17 DIAGNOSIS — I350 Nonrheumatic aortic (valve) stenosis: Secondary | ICD-10-CM | POA: Diagnosis present

## 2024-05-17 DIAGNOSIS — Z888 Allergy status to other drugs, medicaments and biological substances status: Secondary | ICD-10-CM

## 2024-05-17 DIAGNOSIS — I480 Paroxysmal atrial fibrillation: Secondary | ICD-10-CM | POA: Diagnosis not present

## 2024-05-17 DIAGNOSIS — Z9842 Cataract extraction status, left eye: Secondary | ICD-10-CM

## 2024-05-17 DIAGNOSIS — Z7902 Long term (current) use of antithrombotics/antiplatelets: Secondary | ICD-10-CM

## 2024-05-17 DIAGNOSIS — E1142 Type 2 diabetes mellitus with diabetic polyneuropathy: Secondary | ICD-10-CM | POA: Diagnosis present

## 2024-05-17 DIAGNOSIS — Z9841 Cataract extraction status, right eye: Secondary | ICD-10-CM

## 2024-05-17 DIAGNOSIS — Z7985 Long-term (current) use of injectable non-insulin antidiabetic drugs: Secondary | ICD-10-CM

## 2024-05-17 DIAGNOSIS — Z79899 Other long term (current) drug therapy: Secondary | ICD-10-CM | POA: Diagnosis not present

## 2024-05-17 DIAGNOSIS — R55 Syncope and collapse: Secondary | ICD-10-CM | POA: Insufficient documentation

## 2024-05-17 DIAGNOSIS — E118 Type 2 diabetes mellitus with unspecified complications: Secondary | ICD-10-CM | POA: Diagnosis present

## 2024-05-17 DIAGNOSIS — Z952 Presence of prosthetic heart valve: Secondary | ICD-10-CM

## 2024-05-17 DIAGNOSIS — I200 Unstable angina: Secondary | ICD-10-CM | POA: Diagnosis present

## 2024-05-17 DIAGNOSIS — G4733 Obstructive sleep apnea (adult) (pediatric): Secondary | ICD-10-CM | POA: Diagnosis present

## 2024-05-17 DIAGNOSIS — Z95818 Presence of other cardiac implants and grafts: Secondary | ICD-10-CM | POA: Diagnosis present

## 2024-05-17 DIAGNOSIS — Z79890 Hormone replacement therapy (postmenopausal): Secondary | ICD-10-CM | POA: Diagnosis not present

## 2024-05-17 DIAGNOSIS — I255 Ischemic cardiomyopathy: Secondary | ICD-10-CM | POA: Diagnosis present

## 2024-05-17 DIAGNOSIS — E1151 Type 2 diabetes mellitus with diabetic peripheral angiopathy without gangrene: Secondary | ICD-10-CM | POA: Diagnosis present

## 2024-05-17 DIAGNOSIS — I2510 Atherosclerotic heart disease of native coronary artery without angina pectoris: Secondary | ICD-10-CM | POA: Diagnosis present

## 2024-05-17 DIAGNOSIS — I5022 Chronic systolic (congestive) heart failure: Secondary | ICD-10-CM | POA: Diagnosis present

## 2024-05-17 DIAGNOSIS — I110 Hypertensive heart disease with heart failure: Secondary | ICD-10-CM | POA: Diagnosis present

## 2024-05-17 DIAGNOSIS — Z7984 Long term (current) use of oral hypoglycemic drugs: Secondary | ICD-10-CM

## 2024-05-17 DIAGNOSIS — M5416 Radiculopathy, lumbar region: Secondary | ICD-10-CM

## 2024-05-17 DIAGNOSIS — R001 Bradycardia, unspecified: Secondary | ICD-10-CM | POA: Diagnosis present

## 2024-05-17 DIAGNOSIS — E039 Hypothyroidism, unspecified: Secondary | ICD-10-CM | POA: Diagnosis present

## 2024-05-17 DIAGNOSIS — Z91048 Other nonmedicinal substance allergy status: Secondary | ICD-10-CM

## 2024-05-17 DIAGNOSIS — Z961 Presence of intraocular lens: Secondary | ICD-10-CM | POA: Diagnosis present

## 2024-05-17 DIAGNOSIS — E1169 Type 2 diabetes mellitus with other specified complication: Secondary | ICD-10-CM | POA: Diagnosis present

## 2024-05-17 DIAGNOSIS — I4819 Other persistent atrial fibrillation: Secondary | ICD-10-CM | POA: Diagnosis present

## 2024-05-17 DIAGNOSIS — E785 Hyperlipidemia, unspecified: Secondary | ICD-10-CM | POA: Diagnosis present

## 2024-05-17 DIAGNOSIS — Z7982 Long term (current) use of aspirin: Secondary | ICD-10-CM | POA: Diagnosis not present

## 2024-05-17 DIAGNOSIS — I455 Other specified heart block: Secondary | ICD-10-CM

## 2024-05-17 DIAGNOSIS — I152 Hypertension secondary to endocrine disorders: Secondary | ICD-10-CM | POA: Diagnosis present

## 2024-05-17 DIAGNOSIS — Z955 Presence of coronary angioplasty implant and graft: Principal | ICD-10-CM

## 2024-05-17 DIAGNOSIS — I25118 Atherosclerotic heart disease of native coronary artery with other forms of angina pectoris: Principal | ICD-10-CM | POA: Diagnosis present

## 2024-05-17 HISTORY — PX: CORONARY ATHERECTOMY: CATH118238

## 2024-05-17 HISTORY — PX: CORONARY LITHOTRIPSY: CATH118330

## 2024-05-17 HISTORY — PX: CORONARY STENT INTERVENTION: CATH118234

## 2024-05-17 LAB — POCT ACTIVATED CLOTTING TIME
Activated Clotting Time: 266 s
Activated Clotting Time: 301 s
Activated Clotting Time: 307 s

## 2024-05-17 LAB — GLUCOSE, CAPILLARY
Glucose-Capillary: 156 mg/dL — ABNORMAL HIGH (ref 70–99)
Glucose-Capillary: 100 mg/dL — ABNORMAL HIGH (ref 70–99)
Glucose-Capillary: 130 mg/dL — ABNORMAL HIGH (ref 70–99)
Glucose-Capillary: 147 mg/dL — ABNORMAL HIGH (ref 70–99)

## 2024-05-17 MED ORDER — LIDOCAINE HCL (PF) 1 % IJ SOLN
INTRAMUSCULAR | Status: AC
  Filled 2024-05-17: qty 60

## 2024-05-17 MED ORDER — NITROGLYCERIN 1 MG/10 ML FOR IR/CATH LAB
INTRA_ARTERIAL | Status: AC
  Filled 2024-05-17: qty 10

## 2024-05-17 MED ORDER — SODIUM CHLORIDE 0.9% FLUSH: 3.0000 mL | INTRAVENOUS | Status: DC | PRN

## 2024-05-17 MED ORDER — FUROSEMIDE 20 MG PO TABS
40.0000 mg | ORAL_TABLET | Freq: Every day | ORAL | Status: DC
  Administered 2024-05-18 – 2024-05-19 (×2): 40 mg via ORAL
  Filled 2024-05-17 (×2): qty 2

## 2024-05-17 MED ORDER — HEPARIN SODIUM (PORCINE) 1000 UNIT/ML IJ SOLN
INTRAMUSCULAR | Status: AC
  Filled 2024-05-17: qty 10

## 2024-05-17 MED ORDER — VERAPAMIL HCL 2.5 MG/ML IV SOLN
INTRAVENOUS | Status: AC
  Filled 2024-05-17: qty 2

## 2024-05-17 MED ORDER — FREE WATER
500.0000 mL | Freq: Once | Status: AC
  Administered 2024-05-17: 14:00:00 500 mL via ORAL

## 2024-05-17 MED ORDER — MIDAZOLAM HCL 2 MG/2ML IJ SOLN
INTRAMUSCULAR | Status: AC
  Filled 2024-05-17: qty 2

## 2024-05-17 MED ORDER — ROPINIROLE HCL 0.5 MG PO TABS
0.5000 mg | ORAL_TABLET | Freq: Every day | ORAL | Status: DC
  Administered 2024-05-17 – 2024-05-18 (×2): 0.5 mg via ORAL
  Filled 2024-05-17 (×2): qty 1

## 2024-05-17 MED ORDER — CLOPIDOGREL BISULFATE 75 MG PO TABS
75.0000 mg | ORAL_TABLET | Freq: Every day | ORAL | Status: DC
  Administered 2024-05-18 – 2024-05-19 (×2): 75 mg via ORAL
  Filled 2024-05-17 (×2): qty 1

## 2024-05-17 MED ORDER — MIDAZOLAM HCL (PF) 2 MG/2ML IJ SOLN
INTRAMUSCULAR | Status: DC | PRN
  Administered 2024-05-17: 10:00:00 2 mg via INTRAVENOUS

## 2024-05-17 MED ORDER — ACETAMINOPHEN 325 MG PO TABS: 650.0000 mg | ORAL_TABLET | ORAL | Status: DC | PRN

## 2024-05-17 MED ORDER — LOSARTAN POTASSIUM 25 MG PO TABS
25.0000 mg | ORAL_TABLET | Freq: Every day | ORAL | Status: DC
  Administered 2024-05-18 – 2024-05-19 (×2): 25 mg via ORAL
  Filled 2024-05-17 (×2): qty 1

## 2024-05-17 MED ORDER — HYDRALAZINE HCL 20 MG/ML IJ SOLN: 10.0000 mg | INTRAMUSCULAR | Status: AC | PRN

## 2024-05-17 MED ORDER — LEVOTHYROXINE SODIUM 112 MCG PO TABS
168.0000 ug | ORAL_TABLET | ORAL | Status: DC
  Administered 2024-05-18: 06:00:00 168 ug via ORAL
  Filled 2024-05-17: qty 2

## 2024-05-17 MED ORDER — CLOPIDOGREL BISULFATE 75 MG PO TABS: 75.0000 mg | ORAL_TABLET | Freq: Every day | ORAL | Status: DC

## 2024-05-17 MED ORDER — ASPIRIN 81 MG PO CHEW: 81.0000 mg | CHEWABLE_TABLET | ORAL | Status: DC

## 2024-05-17 MED ORDER — SODIUM CHLORIDE 0.9 % IV SOLN: 250.0000 mL | INTRAVENOUS | Status: DC | PRN

## 2024-05-17 MED ORDER — IOHEXOL 350 MG/ML SOLN
INTRAVENOUS | Status: DC | PRN
  Administered 2024-05-17: 12:00:00 90 mL

## 2024-05-17 MED ORDER — VERAPAMIL HCL 2.5 MG/ML IV SOLN
INTRAVENOUS | Status: DC | PRN
  Administered 2024-05-17: 10:00:00 10 mL via INTRA_ARTERIAL

## 2024-05-17 MED ORDER — LEVOTHYROXINE SODIUM 112 MCG PO TABS
112.0000 ug | ORAL_TABLET | ORAL | Status: DC
  Administered 2024-05-19: 112 ug via ORAL
  Filled 2024-05-17: qty 1

## 2024-05-17 MED ORDER — FENTANYL CITRATE (PF) 100 MCG/2ML IJ SOLN
INTRAMUSCULAR | Status: DC | PRN
  Administered 2024-05-17: 10:00:00 50 ug via INTRAVENOUS

## 2024-05-17 MED ORDER — SODIUM CHLORIDE 0.9 % IV SOLN
INTRAVENOUS | Status: AC | PRN
  Administered 2024-05-17: 10:00:00 84 mL/h via INTRAVENOUS

## 2024-05-17 MED ORDER — SODIUM CHLORIDE 0.9% FLUSH
3.0000 mL | Freq: Two times a day (BID) | INTRAVENOUS | Status: DC
  Administered 2024-05-17: 14:00:00 3 mL via INTRAVENOUS

## 2024-05-17 MED ORDER — ONDANSETRON HCL 4 MG/2ML IJ SOLN: 4.0000 mg | Freq: Four times a day (QID) | INTRAMUSCULAR | Status: DC | PRN

## 2024-05-17 MED ORDER — EZETIMIBE 10 MG PO TABS
10.0000 mg | ORAL_TABLET | Freq: Every day | ORAL | Status: DC
  Administered 2024-05-18 – 2024-05-19 (×2): 10 mg via ORAL
  Filled 2024-05-17 (×2): qty 1

## 2024-05-17 MED ORDER — LABETALOL HCL 5 MG/ML IV SOLN: 10.0000 mg | INTRAVENOUS | Status: AC | PRN

## 2024-05-17 MED ORDER — ASPIRIN 81 MG PO TBEC
81.0000 mg | DELAYED_RELEASE_TABLET | Freq: Every day | ORAL | Status: DC
  Administered 2024-05-18 – 2024-05-19 (×2): 81 mg via ORAL
  Filled 2024-05-17 (×2): qty 1

## 2024-05-17 MED ORDER — LEVOTHYROXINE SODIUM 112 MCG PO TABS: 112.0000 ug | ORAL_TABLET | ORAL | Status: DC

## 2024-05-17 MED ORDER — METOPROLOL SUCCINATE ER 25 MG PO TB24: 50.0000 mg | ORAL_TABLET | Freq: Every day | ORAL | Status: DC

## 2024-05-17 MED ORDER — SODIUM CHLORIDE 0.9 % IV SOLN
5.0000 mg/kg | Freq: Once | INTRAVENOUS | Status: AC
  Administered 2024-05-17: 11:00:00 417.5 mg via INTRAVENOUS
  Filled 2024-05-17: qty 16.7

## 2024-05-17 MED ORDER — LIDOCAINE HCL (PF) 1 % IJ SOLN
INTRAMUSCULAR | Status: DC | PRN
  Administered 2024-05-17: 10:00:00 2 mL

## 2024-05-17 MED ORDER — FREE WATER: 500.0000 mL | Freq: Once | Status: DC

## 2024-05-17 MED ORDER — SPIRONOLACTONE 25 MG PO TABS
25.0000 mg | ORAL_TABLET | Freq: Every day | ORAL | Status: DC
  Administered 2024-05-18 – 2024-05-19 (×2): 25 mg via ORAL
  Filled 2024-05-17 (×2): qty 1

## 2024-05-17 MED ORDER — SODIUM CHLORIDE 0.9 % IV SOLN: 250.0000 mL | INTRAVENOUS | Status: AC | PRN

## 2024-05-17 MED ORDER — SODIUM CHLORIDE 0.9% FLUSH
3.0000 mL | Freq: Two times a day (BID) | INTRAVENOUS | Status: DC
  Administered 2024-05-17 – 2024-05-19 (×5): 3 mL via INTRAVENOUS

## 2024-05-17 MED ORDER — VIPERSLIDE LUBRICANT OPTIME
TOPICAL | Status: DC | PRN
  Administered 2024-05-17: 12:00:00 20 mL via SURGICAL_CAVITY

## 2024-05-17 MED ORDER — HEPARIN SODIUM (PORCINE) 1000 UNIT/ML IJ SOLN
INTRAMUSCULAR | Status: DC | PRN
  Administered 2024-05-17: 11:00:00 3000 [IU] via INTRAVENOUS
  Administered 2024-05-17: 10:00:00 10000 [IU] via INTRAVENOUS
  Administered 2024-05-17: 11:00:00 2000 [IU] via INTRAVENOUS

## 2024-05-17 MED ORDER — HEPARIN (PORCINE) IN NACL 1000-0.9 UT/500ML-% IV SOLN
INTRAVENOUS | Status: DC | PRN
  Administered 2024-05-17 (×2): 500 mL

## 2024-05-17 MED ADMIN — Pravastatin Sodium Tab 40 MG: 40 mg | ORAL | @ 18:00:00 | NDC 60505017009

## 2024-05-17 MED ADMIN — Amiodarone HCl Tab 200 MG: 200 mg | ORAL | @ 21:00:00 | NDC 72888003905

## 2024-05-17 MED FILL — Pravastatin Sodium Tab 40 MG: 40.0000 mg | ORAL | Qty: 1 | Status: AC

## 2024-05-17 MED FILL — Amiodarone HCl Tab 200 MG: 200.0000 mg | ORAL | Qty: 1 | Status: AC

## 2024-05-17 MED FILL — Fentanyl Citrate Preservative Free (PF) Inj 100 MCG/2ML: INTRAMUSCULAR | Qty: 2 | Status: AC

## 2024-05-17 NOTE — Significant Event (Addendum)
 Called by RN because patient had a syncopal episode while in bed. Per the patient she felt nauseous and was trying to sit up and that's the last thing she remembers. She denies CP, SOB before or after the event. Currently asx except for fatigue. Per RN she was unresponsive and so 4-5 compressions were administered before she regained consciousness. Tele shows evidence of vagal mechanism with progressive increase in the P-P and R-R intervals before loss of both sinus and AV nodal activity for ~10 seconds. This may or may not be related to RCA PCI earlier today which can be associated with reperfusion vagal events but this far out is more unusual. Vitals currently stable. EKG x2 without evidence of ischemia. Will continue to monitor on tele and have patient stay in bed for now. Metoprolol  has been discontinued for time being.

## 2024-05-17 NOTE — Plan of Care (Signed)

## 2024-05-17 NOTE — Interval H&P Note (Signed)
 History and Physical Interval Note:  05/17/2024 9:57 AM  Emily [name]  has presented today for surgery, with the diagnosis of CAD / Aortic Stenosis.  The various methods of treatment have been discussed with the patient and family. After consideration of risks, benefits and other options for treatment, the patient has consented to  Procedures: CORONARY STENT INTERVENTION (N/A) as a surgical intervention.  The patient's history has been reviewed, patient examined, no change in status, stable for surgery.  I have reviewed the patient's chart and labs.  Questions were answered to the patient's satisfaction.    Cath Lab Visit (complete for each Cath Lab visit)  Clinical Evaluation Leading to the Procedure:   ACS: No.  Non-ACS:    Anginal Classification: CCS II  Anti-ischemic medical therapy: Minimal Therapy (1 class of medications)  Non-Invasive Test Results: No non-invasive testing performed  Prior CABG: No previous CABG   Lonni Cash

## 2024-05-18 ENCOUNTER — Other Ambulatory Visit: Payer: Self-pay

## 2024-05-18 ENCOUNTER — Encounter (HOSPITAL_COMMUNITY): Payer: Self-pay | Admitting: Cardiovascular Disease

## 2024-05-18 DIAGNOSIS — R55 Syncope and collapse: Secondary | ICD-10-CM

## 2024-05-18 DIAGNOSIS — I455 Other specified heart block: Secondary | ICD-10-CM

## 2024-05-18 DIAGNOSIS — I5022 Chronic systolic (congestive) heart failure: Secondary | ICD-10-CM

## 2024-05-18 DIAGNOSIS — I25118 Atherosclerotic heart disease of native coronary artery with other forms of angina pectoris: Principal | ICD-10-CM

## 2024-05-18 DIAGNOSIS — I480 Paroxysmal atrial fibrillation: Secondary | ICD-10-CM

## 2024-05-18 LAB — CBC
HCT: 29.7 % — ABNORMAL LOW (ref 36.0–46.0)
Hemoglobin: 9.7 g/dL — ABNORMAL LOW (ref 12.0–15.0)
MCH: 30 pg (ref 26.0–34.0)
MCHC: 32.7 g/dL (ref 30.0–36.0)
MCV: 92 fL (ref 80.0–100.0)
Platelets: 179 K/uL (ref 150–400)
RBC: 3.23 MIL/uL — ABNORMAL LOW (ref 3.87–5.11)
RDW: 16.7 % — ABNORMAL HIGH (ref 11.5–15.5)
WBC: 8.7 K/uL (ref 4.0–10.5)
nRBC: 0 % (ref 0.0–0.2)

## 2024-05-18 LAB — BASIC METABOLIC PANEL WITH GFR
Anion gap: 9 (ref 5–15)
BUN: 20 mg/dL (ref 8–23)
CO2: 25 mmol/L (ref 22–32)
Calcium: 8.7 mg/dL — ABNORMAL LOW (ref 8.9–10.3)
Chloride: 102 mmol/L (ref 98–111)
Creatinine, Ser: 0.87 mg/dL (ref 0.44–1.00)
GFR, Estimated: >60 mL/min (ref >60)
Glucose, Bld: 190 mg/dL — ABNORMAL HIGH (ref 70–99)
Potassium: 4.4 mmol/L (ref 3.5–5.1)
Sodium: 136 mmol/L (ref 135–145)

## 2024-05-18 MED ORDER — AMIODARONE HCL 200 MG PO TABS
200.0000 mg | ORAL_TABLET | Freq: Every day | ORAL | Status: DC
  Administered 2024-05-19: 200 mg via ORAL
  Filled 2024-05-18: qty 1

## 2024-05-18 MED ORDER — ROSUVASTATIN CALCIUM 20 MG PO TABS
20.0000 mg | ORAL_TABLET | Freq: Every day | ORAL | Status: DC
  Administered 2024-05-19: 20 mg via ORAL
  Filled 2024-05-18: qty 1

## 2024-05-18 MED ADMIN — Amiodarone HCl Tab 200 MG: 200 mg | ORAL | @ 08:00:00 | NDC 72888003905

## 2024-05-18 NOTE — Progress Notes (Signed)
 Progress Note  Patient Name: Emily [name] Date of Encounter: 05/18/2024 [facility] Cardiologist: Lynwood Schilling, MD   Interval Summary   Emily [name] is a 32 Caucasian female with  history of PAF s/p Watchman due to recurrent GI bleeding, bifasciular block (RBBB, LAFB), DM type 2 with peripheral neuropathy, HTN, HLD, carotid artery disease s/p L CEA (2017) and right TCAR (03/2023) with 50-75% stenosis of stented RICA, severe asymmetric LVH (cMRI felt to be 2/2 AS), severe 3V CAD, ischemic cardiomyopathy, chronic systolic CHF and severe low flow/low gradient aortic stenosis being worked up for TAVR, cardiac catheterization on 04/22/2023 revealing high-grade right coronary stenosis which was felt to be significant and hence was electively admitted to the hospital for PCI on 05/17/2024, underwent successful PCI to a dominant RCA with implantation of 2 drug-eluting stents in the proximal to mid and distal RCA.  She was admitted overnight for observation.  While on telemetry last evening around 1130, patient started having severe nausea and patient called the nurse and at that time she started experiencing marked bradycardia followed by a 10-second pause with no sinus node activity or escape.  She had a syncope.  This morning she is doing well, denies any specific complaints.  On further questioning, does state that she had another episode in the past where she felt mild generalized weakness and her heart rate had dropped down to 25 bpm and she had called our office however the heart rate spontaneously improved.  No further episodes since then until last night.  Patient is beta-blocker was discontinued and she was evaluated by on-call cardiology.  Vital Signs Vitals:   05/18/24 0035 05/18/24 0105 05/18/24 0525 05/18/24 0739  BP: (!) 109/43 (!) 126/48 (!) 109/44 (!) 125/40  Pulse: (!) 56 (!) 57 (!) 54 (!) 53  Resp:    17  Temp:    97.6 F (36.4 C)  TempSrc:    Oral  SpO2: 90%  93% 93% 98%  Weight:      Height:        Intake/Output Summary (Last 24 hours) at 05/18/2024 1548 Last data filed at 05/17/2024 2126 Gross per 24 hour  Intake 957.8 ml  Output --  Net 957.8 ml      05/17/2024    7:02 AM 05/15/2024    3:17 PM 05/08/2024    9:11 AM  Last 3 Weights  Weight (lbs) 184 lb 184 lb 185 lb  Weight (kg) 83.462 kg 83.462 kg 83.915 kg      Telemetry/ECG  NSR. - Personally Reviewed  Nausea, followed by marked sinus bradycardia with heart rate dropping down to 25 bpm followed by prolonged asystole of 10 seconds and syncope, witnessed by nursing last night.   Coronary angiogram 04/21/2024:  Coronary angiogram 05/17/2024: Severe, heavily calcified stenoses in the proximal, mid and distal RCA Orbital atherectomy of the proximal and mid lesion followed by Shockwave lithotripsy of both lesions and then one  STENT ONYX FRONTIER 3.0X12 DES Severe stenosis distal RCA Successful PTCA/DES x 1 distal RCA STENT ONYX FRONTIER 3.0X34 DES   Recommendations: Continue ASA and Plavix . Continue planning for TAVR       Echocardiogram 04/18/2024:   1. Left ventricular ejection fraction, by estimation, is 45 to 50%. The left ventricle has mildly decreased function. Left ventricular endocardial border not optimally defined to evaluate regional wall motion. There is severe left ventricular  hypertrophy. Left ventricular diastolic parameters are indeterminate.  2. Right ventricular systolic function is normal. The right ventricular  size is normal. Tricuspid regurgitation signal is inadequate for assessing PA pressure.  3. Left atrial size was mildly dilated.  4. The mitral valve is abnormal. Mild mitral valve regurgitation. Mild to moderate mitral stenosis. The mean mitral valve gradient is 5.0 mmHg. Moderate mitral annular calcification.  5. AVA VTI 0.61, mean gradient 28 mmHg, DI 0.23, SVI 23. Severe aortic stenosis with lower than expected gradient due to decreased stroke  volume index. . The aortic valve is tricuspid. There is moderate calcification of the aortic valve. There is  moderate thickening of the aortic valve. Aortic valve regurgitation is not visualized. Severe aortic valve stenosis.  6. The inferior vena cava is dilated in size with >50% respiratory variability, suggesting right atrial pressure of 8 mmHg.  Physical Exam  Physical Exam Neck:     Vascular: Carotid bruit (bilateral) present. No JVD.  Cardiovascular:     Rate and Rhythm: Normal rate and regular rhythm.     Pulses: Intact distal pulses.     Heart sounds: S1 normal and S2 normal. Murmur heard.     Mid to late systolic murmur is present with a grade of 3/6 at the upper right sternal border radiating to the neck.     No gallop.  Pulmonary:     Effort: Pulmonary effort is normal.     Breath sounds: Normal breath sounds.  Abdominal:     General: Bowel sounds are normal.     Palpations: Abdomen is soft.  Musculoskeletal:     Right lower leg: No edema.     Left lower leg: No edema.     Assessment & Plan   CAD  - Underwent cath yesterday with Dr. Verlin- found to have severe heavily calcified stenoses in the proximal, mid, and distal RCA. Treated with orbital atherectomy of the proximal and mis lesions, followed by shockwave lithotripsy of both. Had 1 DES placed  - Recommended continuing ASA and plavix   - Continue zetia  10 mg daily and crestor  20 mg daily  - Holding metoprolol  after patient had a syncopal episode overnight   Syncope  - Yesterday around 11:30 PM, patient had a syncopal episode. Remembered feeling nauseous and trying to sit up. On tele, there was evidence of vagal mechanism with bradycardia before loss of both sinus and aV nodal activity for 10 seconds - Holding metoprolol  as above and observing overnight  - States that she had another episode similarly but mild where her heart rate had dropped down to around 25 bpm several weeks ago however came back up on its  own, felt fatigued and dizzy.  No syncope or near syncope.  Severe Aortic Stenosis  - Patient has severe low flow/low gradient aortic stenosis  - Followed by Dr. Verlin. Not a good candidate for surgical approach due to heavily calcified ascending aorta. In the process of being worked up for TAVR  - Seeing Dr. Verlin on 12/24   PAF  - Maintaining NSR  - Continue amiodarone , presently on 200 mg p.o. twice daily, will reduce it to 200 mg once a day and potentially consider reducing to 200 mg daily.  Beta-blocker has been discontinued. - Has watchman   Chronic systolic heart failure  - Echo in 04/2024 showed EF 45-50% - Euvolemic on exam  - Start losartan  25 mg daily  - Continue spironolactone  25 mg daily  - No BB for now as above - Continue lasix  40 mg daily   Potential discharge tomorrow.      For  questions or updates, please contact [facility] Please consult [email or web address] for contact info under         Signed, Gordy Bergamo, MD

## 2024-05-18 NOTE — Progress Notes (Signed)
 Cardiac Rehab Phase 1   Patient seen with family at bedside. Patient states she has been independently ambulating w/o assistance. No sob or dizziness at this time. Patient educated on restrictions post intervention. Cardiac education included heart healthy diet, adherence to antiplatelet medication, nitroglycerin  use, when to call emergency services, exercise guidelines, and importance of participation in CRP2. Will refer to [place] upon patient preference. To note, patient will undergo TAVR at a later date. Referral will still be placed for patient to attend when appropriate. All concerns addressed and no further questions at this time.     [id number] Isaiah JAYSON [place], BSN, RN

## 2024-05-18 NOTE — Progress Notes (Signed)
°   05/17/24 2255  Vitals  BP (!) 118/58  MAP (mmHg) 76  BP Location Left Arm  BP Method Automatic  Patient Position (if appropriate) Lying  Pulse Rate (!) 34  ECG Heart Rate (!) 26  Level of Consciousness  Level of Consciousness Unresponsive  MEWS COLOR  MEWS Score Color Red  EKG   EKG performed? Placed on chart RN aware  Oxygen Therapy  SpO2 97 %  O2 Device Room Air  Patient Activity (if Appropriate) In bed  Pulse Oximetry Type Continuous  ECG Monitoring  Cardiac Rhythm Asystole;Other (Comment) (10.10 SECOND PAUSE. CHARGE NURSE NOTIFIED.)  Glasgow Coma Scale  Eye Opening 1  Best Verbal Response (NON-intubated) 1  Modified Verbal Response (INTUBATED) 1  Best Motor Response 1  Glasgow Coma Scale Score 4  MEWS Score  MEWS Temp 0  MEWS Systolic 0  MEWS Pulse 2  MEWS RR 0  MEWS LOC 3  MEWS Score 5  Provider Notification  Provider Name/Title Dr Marty Carrier  Date Provider Notified 05/17/24  Time Provider Notified 2300  Method of Notification Page  Notification Reason Change in status  Provider response In department  Date of Provider Response 05/17/24  Time of Provider Response 2308  Rapid Response Notification  Name of Rapid Response RN Notified Alm Burner, RN  Date Rapid Response Notified 05/17/24  Time Rapid Response Notified 2257  Note  Patient Observations Patient regained consciousness after few chest compressions administered

## 2024-05-19 ENCOUNTER — Other Ambulatory Visit (HOSPITAL_COMMUNITY): Payer: Self-pay

## 2024-05-19 ENCOUNTER — Inpatient Hospital Stay (HOSPITAL_BASED_OUTPATIENT_CLINIC_OR_DEPARTMENT_OTHER)
Admission: AD | Admit: 2024-05-19 | Discharge: 2024-05-19 | Disposition: A | Discharge status: Home / Self Care | Source: Home / Self Care | Attending: Student | Admitting: Student

## 2024-05-19 DIAGNOSIS — I350 Nonrheumatic aortic (valve) stenosis: Secondary | ICD-10-CM

## 2024-05-19 DIAGNOSIS — G4733 Obstructive sleep apnea (adult) (pediatric): Secondary | ICD-10-CM

## 2024-05-19 DIAGNOSIS — I455 Other specified heart block: Secondary | ICD-10-CM

## 2024-05-19 DIAGNOSIS — R55 Syncope and collapse: Secondary | ICD-10-CM | POA: Insufficient documentation

## 2024-05-19 HISTORY — DX: Syncope and collapse: R55

## 2024-05-19 MED ORDER — GABAPENTIN 300 MG PO CAPS: 300.0000 mg | ORAL_CAPSULE | Freq: Two times a day (BID) | ORAL | Status: DC

## 2024-05-19 MED ORDER — AMIODARONE HCL 200 MG PO TABS: 200.0000 mg | ORAL_TABLET | Freq: Every day | ORAL | Status: AC

## 2024-05-19 MED ORDER — METOPROLOL SUCCINATE ER 25 MG PO TB24
25.0000 mg | ORAL_TABLET | Freq: Every evening | ORAL | 2 refills | Status: AC
  Filled 2024-05-19: qty 30, 30d supply, fill #0

## 2024-05-19 MED ORDER — ROSUVASTATIN CALCIUM 20 MG PO TABS
20.0000 mg | ORAL_TABLET | Freq: Every day | ORAL | 2 refills | Status: DC
  Filled 2024-05-19: qty 30, 30d supply, fill #0

## 2024-05-19 NOTE — Plan of Care (Signed)
  Problem: Activity: Goal: Ability to return to baseline activity level will improve Outcome: Progressing   Problem: Cardiovascular: Goal: Ability to achieve and maintain adequate cardiovascular perfusion will improve Outcome: Progressing Goal: Vascular access site(s) Level 0-1 will be maintained Outcome: Progressing   Problem: Education: Goal: Knowledge of General Education information will improve Description: Including pain rating scale, medication(s)/side effects and non-pharmacologic comfort measures Outcome: Progressing

## 2024-05-19 NOTE — Discharge Summary (Signed)
 " Discharge Summary   Patient ID: Emily [name] MRN: [id number]; DOB: January 14, 1951  Admit date: 05/17/2024 Discharge date: 05/19/2024  PCP:  Jolinda Norene HERO, DO   [facility] Providers Cardiologist:  Lynwood Schilling, MD  Electrophysiologist:  Will Gladis Norton, MD  Structural Heart:  Lonni Cash, MD     Discharge Diagnoses  Principal Problem:   Coronary artery disease Active Problems:   Severe aortic stenosis   Bradycardia   Syncope and collapse   Hypertension   Type 2 diabetes mellitus with complication, without long-term current use of insulin  (HCC)   Hyperlipidemia   OSA on CPAP   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 device   Paroxysmal atrial fibrillation (HCC)   Diagnostic Studies/Procedures   Coronary Stent Intervention 05/17/2024:   Prox RCA-1 lesion is 90% stenosed.   Prox RCA-2 lesion is 90% stenosed.   Mid RCA lesion is 90% stenosed.   RPDA lesion is 40% stenosed.   A drug-eluting stent was successfully placed using a STENT ONYX FRONTIER 3.0X34.   A drug-eluting stent was successfully placed using a STENT ONYX FRONTIER 3.0X12.   Post intervention, there is a 0% residual stenosis.   Post intervention, there is a 0% residual stenosis.   Post intervention, there is a 0% residual stenosis.   Severe, heavily calcified stenoses in the proximal, mid and distal RCA\ Orbital atherectomy of the proximal and mid lesion followed by Shockwave lithotripsy of both lesions and then one drug eluting stent placement Severe stenosis distal RCA Successful PTCA/DES x 1 distal RCA   Recommendations: Continue ASA and Plavix . Continue planning for TAVR  Diagnostic Dominance: Right  Intervention      _____________   History of Present Illness   Emily [name] is a 73 y.o. 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  Echo in 11/2023 showed LVEF of 65% with severe asymmetric LVH, normal RV, severe left atrial lodgment, and moderate to severe aortic stenosis (mean gradient 36 mmHg, AVA 0.74 cm, and DI 0.23. Cardiac MRI in 03/2024 showed asymmetric LVH measuring up to 16 mm in septum (11mm in the posterior wall). This was felt to be secondary to her aortic stenosis. She was referred to the Structural Heart Team but was admitted for acute CHF in setting of atrial fibrillation with RVR in 04/2024 before she could be seen. She was diuresed with IV Lasix  and treated with IV Amiodarone  and converted to sinus rhythm. Repeat Echo showed LVEF of 45-50% with severe LVH, mild mitral regurgitation/ mild to moderate mitral stenosis, and severe low flow/ low gradient aortic stenosis (mean gradient 28 mmHg, AVA 0.6 cm^2, DI 0.23, SVI 23. R/ LHC showed severe three vessel CAD with 50% to proximal to mid LAD followed by 80% stenosis of mid LAD, two tandem 90% stenoses of the proximal RCA followed by another 90% stenosis of mid RCA, 40% stenosis of RPDA, and 95% stenosis of OM1 as well as elevated right heart pressures with wedge pressure of 30-35 mmHg. CT surgery was consulted. Pre-CABG dopplers showed 50-75% stenosis of previously stented right ICA and moderate left lower extremity PAD. She was  seen by Dr. Daniel with CT Surgery and STS risk calculated score of morbidity and mortality is quite high at 26.9%. Patient also voiced a preference for a minimally invasive approach. Therefore, TAVR CTs were completed and revealed anatomy was suitable for TAVR. She was seen by Dr. Verlin on 05/08/2024 to discuss PCI of RCA and TAVR. She was doing well at that  time with no chest pain or dyspnea. Lower extremity edema had improved as well. Coronary stent intervention for RCA was arranged.  Hospital Course   Consultants: None   Patient presented to [facility] on 05/17/2024 for planned RCA intervention.  She underwent orbital atherectomy of the proximal and mid lesion followed by Shockwave lithotripsy of bother lesions and then one DES to the proximal and mid RCA lesions as well as PTCA/ DES to distal RCA. She was kept overnight for observation. Overnight, she was noted to have an episode of marked bradycardia followed by a 10 second pause with no sinus node activity or escape and had a syncopal episode. Her home Toprol -XL was stopped and Amiodarone  was decreased to 200mg  daily. Upon further questioning, patient revealed that this episode occurred after she woke up short of breath and then had severe nausea. She has obstructive sleep apnea but was not on her CPAP machine so this was felt to be vasovagal in setting of apnea and then nausea. She was kept an additional night and had no recurrent pauses while on CPAP. She is felt to be stable for discharge. Will continue DAPT with Aspirin  81mg  daily and Plavix  75mg  daily. Will stop Pravastatin  and switch to Crestor  20mg  daily. Will continue Zetia  10mg  daily. Recommend repeat lipid panel and LFTs in 6- 8 weeks. Will resume Toprol -XL at discharge but at a lower dose of 25mg  daily. Will also discharge on lower dose of Amiodarone  200mg  daily. Continue all other home medications. She already has close follow-up with Dr. Verlin scheduled for 05/24/2024. Will place live 2 week Zio monitor prior to discharge to assess for any recurrent bradycardia/ pauses.     Did the patient have an acute coronary syndrome (MI, NSTEMI, STEMI, etc) this admission?:  No                               Did the patient have a percutaneous coronary intervention (stent / angioplasty)?:  Yes.     Cath/PCI Registry Performance & Quality  Measures: Aspirin  prescribed? - Yes ADP Receptor Inhibitor (Plavix /Clopidogrel , Brilinta/Ticagrelor or Effient/Prasugrel) prescribed (includes medically managed patients)? - Yes High Intensity Statin (Lipitor 40-80mg  or Crestor  20-40mg ) prescribed? - Yes For EF <40%, was ACEI/ARB prescribed? - Not Applicable (EF >/= 40%) For EF <40%, Aldosterone Antagonist (Spironolactone  or Eplerenone) prescribed? - Not Applicable (EF >/= 40%) Cardiac Rehab Phase II ordered? - Yes   _____________  Discharge Vitals Blood pressure (!) 163/49, pulse 64, temperature 98.6 F (37 C), temperature source Oral, resp. rate 18, height 5' 2 (1.575 m), weight 83.5 kg, SpO2 99%.  Filed Weights   05/17/24 0702  Weight: 83.5 kg    Labs & Radiologic Studies  CBC Recent Labs    05/18/24 0439  WBC 8.7  HGB 9.7*  HCT 29.7*  MCV 92.0  PLT 179   Basic Metabolic Panel Recent Labs    87/81/74 0439  NA 136  K 4.4  CL 102  CO2 25  GLUCOSE 190*  BUN 20  CREATININE 0.87  CALCIUM  8.7*  Liver Function Tests No results for input(s): AST, ALT, ALKPHOS, BILITOT, PROT, ALBUMIN in the last 72 hours. No results for input(s): LIPASE, AMYLASE in the last 72 hours. High Sensitivity Troponin:   No results for input(s): TROPONINIHS in the last 720 hours.  No results for input(s): TRNPT in the last 720 hours.  BNP Invalid input(s): POCBNP No results for input(s): PROBNP in the last 72 hours.  No results for input(s): BNP in the last 72 hours.  D-Dimer No results for input(s): DDIMER in the last 72 hours. Hemoglobin A1C No results for input(s): HGBA1C in the last 72 hours. Fasting Lipid Panel No results for input(s): CHOL, HDL, LDLCALC, TRIG, CHOLHDL, LDLDIRECT in the last 72 hours. No results found for: LIPOA  Thyroid  Function Tests No results for input(s): TSH, T4TOTAL, T3FREE, THYROIDAB in the last 72 hours.  Invalid input(s): FREET3 _____________   CARDIAC CATHETERIZATION Result Date: 05/18/2024   Prox RCA-1 lesion is 90% stenosed.   Prox RCA-2 lesion is 90% stenosed.   Mid RCA lesion is 90% stenosed.   RPDA lesion is 40% stenosed.   A drug-eluting stent was successfully placed using a STENT ONYX FRONTIER 3.0X34.   A drug-eluting stent was successfully placed using a STENT ONYX FRONTIER 3.0X12.   Post intervention, there is a 0% residual stenosis.   Post intervention, there is a 0% residual stenosis.   Post intervention, there is a 0% residual stenosis. Severe, heavily calcified stenoses in the proximal, mid and distal RCA\ Orbital atherectomy of the proximal and mid lesion followed by Shockwave lithotripsy of both lesions and then one drug eluting stent placement Severe stenosis distal RCA Successful PTCA/DES x 1 distal RCA Recommendations: Continue ASA and Plavix . Continue planning for TAVR   CT Angio Abd/Pel w/ and/or w/o Result Date: 04/25/2024 EXAM: CTA CHEST, ABDOMEN AND PELVIS WITHOUT AND WITH CONTRAST 04/24/2024 09:08:38 AM TECHNIQUE: CTA of the chest was performed with the administration of intravenous contrast. CTA of the abdomen and pelvis was performed without and with the administration of intravenous contrast. Multiplanar reformatted images are provided for review. MIP images are provided for review. Automated exposure control, iterative reconstruction, and/or weight based adjustment of the mA/kV was utilized to reduce the radiation dose to as low as reasonably achievable. COMPARISON: 05/26/2014 CLINICAL HISTORY: Aortic valve replacement (TAVR), pre-op eval. FINDINGS: VASCULATURE: AORTA: Moderate scattered calcified plaque throughout the thoracic aorta. Extensive calcified plaque through the abdominal aorta without an aneurysm. No dissection. PULMONARY ARTERIES: No pulmonary embolism with the limits of this exam. GREAT VESSELS OF AORTIC ARCH: 3 vessel brachiocephalic arterial origin anatomy without proximal stenosis. No dissection. No  arterial occlusion or significant stenosis. CELIAC TRUNK: osteal calcified plaque without high grade stenosis. No occlusion or significant stenosis. SUPERIOR MESENTERIC ARTERY: origin calcified plaque extending over the length of at least 3 cm resulting in at least moderate stenosis, atheromatous but patent distally. INFERIOR MESENTERIC ARTERY: Calcified plaque at the origin which remains patent. No occlusion or significant stenosis. RENAL ARTERIES: Single left renal artery with fairly heavily calcified ostial plaque resulting in stenosis of at least moderate severity. Duplicated right renal arteries, superior dominant with heavily calcified ostial plaque resulting in stenosis of at least moderate severity. ILIAC ARTERIES: Scattered calcified plaque throughout bilateral common and internal iliac arteries without high grade stenosis or aneurysm. Mild tortuosity of the iliac arterial system bilaterally. Heavily calcified plaque in the right common femoral artery resulting in short segment stenosis of mild to moderate severity. Heavily calcified plaque in the  proximal visualized aspect of bilateral superficial femoral arteries. CHEST: MEDIASTINUM: Occluder device in the left atrial appendage. Mitral annulus calcifications. Coronary leaflet calcifications. Extensive 3-vessel coronary calcifications. No mediastinal lymphadenopathy. The heart and pericardium demonstrate no acute abnormality. LUNGS AND PLEURA: Small left and moderate right pleural effusions, new since previous. Dependent atelectasis/consolidation posteriorly in the lung bases. No pneumothorax. THORACIC BONES AND SOFT TISSUES: No acute bone or soft tissue abnormality. ABDOMEN AND PELVIS: LIVER: Mildly nodular hepatic contour suggesting cirrhosis. GALLBLADDER AND BILE DUCTS: Gallbladder is unremarkable. No biliary ductal dilatation. SPLEEN: The spleen is unremarkable. PANCREAS: The pancreas is unremarkable. ADRENAL GLANDS: Bilateral adrenal glands  demonstrate no acute abnormality. KIDNEYS, URETERS AND BLADDER: No stones in the kidneys or ureters. No hydronephrosis. No perinephric or periureteral stranding. Urinary bladder is unremarkable. GI AND BOWEL: Stomach and duodenal sweep demonstrate no acute abnormality. There is no bowel obstruction. No abnormal bowel wall thickening or distension. REPRODUCTIVE: Reproductive organs are unremarkable. PERITONEUM AND RETROPERITONEUM: No ascites or free air. LYMPH NODES: No lymphadenopathy. ABDOMINAL BONES AND SOFT TISSUES: multilevel spondylotic change through the lumbar spine. Mild L1 vertebral compression deformity with less than 20% loss of height, age indeterminate. Mild grade 1 anterolisthesis L4-L5 probably related to bilateral facet DJD. No acute soft tissue abnormality. IMPRESSION: 1. SMA origin calcified plaque extending over at least 3 cm resulting in at least moderate stenosis, atheromatous but patent distally. 2. Single left renal artery with heavily calcified plaque resulting in at least moderate stenosis. 3. Duplicated right renal arteries, superior dominant, with heavily calcified plaque resulting in at least moderate stenosis. 4. Heavily calcified plaque in the right common femoral artery causing a short segment stenosis of mild to moderate severity. 5. Heavily calcified plaque in the proximal superficial femoral arteries bilaterally. Correlate with ABIs. Electronically signed by: Katheleen Faes MD 04/25/2024 09:19 AM EST RP Workstation: [id number]   CT ANGIO CHEST AORTA W/CM & OR WO/CM Result Date: 04/25/2024 EXAM: CTA CHEST, ABDOMEN AND PELVIS WITHOUT AND WITH CONTRAST 04/24/2024 09:08:38 AM TECHNIQUE: CTA of the chest was performed with the administration of intravenous contrast. CTA of the abdomen and pelvis was performed without and with the administration of intravenous contrast. Multiplanar reformatted images are provided for review. MIP images are provided for review. Automated exposure  control, iterative reconstruction, and/or weight based adjustment of the mA/kV was utilized to reduce the radiation dose to as low as reasonably achievable. COMPARISON: 05/26/2014 CLINICAL HISTORY: Aortic valve replacement (TAVR), pre-op eval. FINDINGS: VASCULATURE: AORTA: Moderate scattered calcified plaque throughout the thoracic aorta. Extensive calcified plaque through the abdominal aorta without an aneurysm. No dissection. PULMONARY ARTERIES: No pulmonary embolism with the limits of this exam. GREAT VESSELS OF AORTIC ARCH: 3 vessel brachiocephalic arterial origin anatomy without proximal stenosis. No dissection. No arterial occlusion or significant stenosis. CELIAC TRUNK: osteal calcified plaque without high grade stenosis. No occlusion or significant stenosis. SUPERIOR MESENTERIC ARTERY: origin calcified plaque extending over the length of at least 3 cm resulting in at least moderate stenosis, atheromatous but patent distally. INFERIOR MESENTERIC ARTERY: Calcified plaque at the origin which remains patent. No occlusion or significant stenosis. RENAL ARTERIES: Single left renal artery with fairly heavily calcified ostial plaque resulting in stenosis of at least moderate severity. Duplicated right renal arteries, superior dominant with heavily calcified ostial plaque resulting in stenosis of at least moderate severity. ILIAC ARTERIES: Scattered calcified plaque throughout bilateral common and internal iliac arteries without high grade stenosis or aneurysm. Mild tortuosity of the iliac arterial system bilaterally. Heavily calcified  plaque in the right common femoral artery resulting in short segment stenosis of mild to moderate severity. Heavily calcified plaque in the proximal visualized aspect of bilateral superficial femoral arteries. CHEST: MEDIASTINUM: Occluder device in the left atrial appendage. Mitral annulus calcifications. Coronary leaflet calcifications. Extensive 3-vessel coronary calcifications. No  mediastinal lymphadenopathy. The heart and pericardium demonstrate no acute abnormality. LUNGS AND PLEURA: Small left and moderate right pleural effusions, new since previous. Dependent atelectasis/consolidation posteriorly in the lung bases. No pneumothorax. THORACIC BONES AND SOFT TISSUES: No acute bone or soft tissue abnormality. ABDOMEN AND PELVIS: LIVER: Mildly nodular hepatic contour suggesting cirrhosis. GALLBLADDER AND BILE DUCTS: Gallbladder is unremarkable. No biliary ductal dilatation. SPLEEN: The spleen is unremarkable. PANCREAS: The pancreas is unremarkable. ADRENAL GLANDS: Bilateral adrenal glands demonstrate no acute abnormality. KIDNEYS, URETERS AND BLADDER: No stones in the kidneys or ureters. No hydronephrosis. No perinephric or periureteral stranding. Urinary bladder is unremarkable. GI AND BOWEL: Stomach and duodenal sweep demonstrate no acute abnormality. There is no bowel obstruction. No abnormal bowel wall thickening or distension. REPRODUCTIVE: Reproductive organs are unremarkable. PERITONEUM AND RETROPERITONEUM: No ascites or free air. LYMPH NODES: No lymphadenopathy. ABDOMINAL BONES AND SOFT TISSUES: multilevel spondylotic change through the lumbar spine. Mild L1 vertebral compression deformity with less than 20% loss of height, age indeterminate. Mild grade 1 anterolisthesis L4-L5 probably related to bilateral facet DJD. No acute soft tissue abnormality. IMPRESSION: 1. SMA origin calcified plaque extending over at least 3 cm resulting in at least moderate stenosis, atheromatous but patent distally. 2. Single left renal artery with heavily calcified plaque resulting in at least moderate stenosis. 3. Duplicated right renal arteries, superior dominant, with heavily calcified plaque resulting in at least moderate stenosis. 4. Heavily calcified plaque in the right common femoral artery causing a short segment stenosis of mild to moderate severity. 5. Heavily calcified plaque in the proximal  superficial femoral arteries bilaterally. Correlate with ABIs. Electronically signed by: Katheleen Faes MD 04/25/2024 09:19 AM EST RP Workstation: [id number]   CT CORONARY MORPH W/CTA COR W/SCORE W/CA W/CM &/OR WO/CM Addendum Date: 04/24/2024 ADDENDUM REPORT: 04/24/2024 20:46 EXAM: OVER-READ INTERPRETATION  CT CHEST The following report is an over-read performed by radiologist Dr. Oneil Devonshire of [facility] on 04/24/2024. This over-read does not include interpretation of cardiac or coronary anatomy or pathology. The coronary calcium  score/coronary CTA interpretation by the cardiologist is attached. COMPARISON:  None. FINDINGS: Cardiovascular: Extensive calcifications of the thoracic aorta are noted. No aneurysmal dilatation is seen. No pulmonary emboli are seen. Mediastinum/Nodes: There are no enlarged lymph nodes within the visualized mediastinum. Lungs/Pleura: Bilateral pleural effusions are noted right greater than left. Basilar atelectatic changes are seen. Upper abdomen: No significant findings in the visualized upper abdomen. Musculoskeletal/Chest wall: No chest wall mass or suspicious osseous findings within the visualized chest. IMPRESSION: Aortic Atherosclerosis (ICD10-I70.0). Bilateral pleural effusions right greater than left with associated atelectatic changes. Electronically Signed   By: Oneil Devonshire M.D.   On: 04/24/2024 20:46   Result Date: 04/24/2024 CLINICAL DATA:  Aortic valve replacement (TAVR), pre-op eval Severe aortic stenosis EXAM: Cardiac TAVR CT TECHNIQUE: The patient was scanned on a Siemens Force 192 slice scanner. A 120 kV retrospective scan was triggered in the descending thoracic aorta at 111 HU's. Gantry rotation speed was 270 msecs and collimation was .9 mm. The 3D data set was reconstructed in 5% intervals of the R-R cycle. Systolic and diastolic phases were analyzed on a dedicated work station using MPR, MIP and VRT modes. The  patient received 80mL OMNIPAQUE   IOHEXOL  350 MG/ML SOLN of contrast. 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 Severely thickened and severely calcified aortic valve cusps. AV calcium  score: 1633 Virtual Basal Annulus Measurements: Maximum/Minimum Diameter: 25.9 x 19.2 mm Perimeter: 70.9 mm Area:  383 mm2 Bulky calcification below right coronary cusp external to LVOT. Membranous septal length: 11 mm Based on these measurements, the annulus would be suitable for a 23 mm Sapien 3 valve. Alternatively, Heart Team can consider 26 mm Evolut valve. Recommend Heart Team discussion for valve selection. Sinus of Valsalva Measurements: Non-coronary:  30 mm Right - coronary:  29 mm Left - coronary:  31 mm Coronary height and sinus of Valsalva Height: Left main: 13.7 mm, Left sinus: 19.4 mm Right coronary: 16.6 mm, Right sinus: 20.1 mm Aorta: Conventional 3 vessel branch pattern of aortic arch. Sinotubular Junction:  27 mm Ascending Thoracic Aorta:  33 mm Aortic Arch:  26 mm Descending Thoracic Aorta:  24 mm Coronary Arteries: Normal coronary origin. Right dominance. The study was performed without use of NTG and insufficient for plaque evaluation. Optimum Fluoroscopic Angle for Delivery: LAO 12 CAU 9 OTHER: Atria: Biatrial dilation Left atrial appendage: Watchman device well seated in LAA, no evidence of peridevice leak, no device related thrombus. Mitral valve:  moderate mitral annular calcifications. Pulmonary artery: Mild dilation of main pulmonary artery, 31 mm. Pulmonary veins: Normal anatomy. IMPRESSION: 1. 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 Severely thickened and severely calcified aortic valve cusps. 2. Aortic valve calcium  score: 1633 3. Annulus area: 383 mm2, suitable for 23 mm Sapien 3 valve. No protruding LVOT calcifications, calcifications are external to annulus. Membranous septal length 11 mm. 4. Sufficient coronary artery heights from annulus. 5. Optimum fluoroscopic angle  for delivery:  LAO 12 CAU 9 Electronically Signed: By: Soyla Merck M.D. On: 04/24/2024 11:40   VAS US  DOPPLER PRE CABG Result Date: 04/24/2024 PREOPERATIVE VASCULAR EVALUATION Patient Name:  Emily [name]  Date of Exam:   04/24/2024 Medical Rec #: [id number]         Accession #:    [id number] Date of Birth: 1950/11/14         Patient Gender: F Patient Age:   58 years Exam Location:  [facility] Procedure:      VAS US  DOPPLER PRE CABG Referring Phys: BAILEY SU --------------------------------------------------------------------------------  Indications:      Pre-CABG. Risk Factors:     Hypertension, Diabetes. 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 Right                   ICA stent velocity suggests 50 - 75% stenosis.                    Left Carotid: Velocities in the left ICA are consistent with a                   1-39% stenosis. The ECA appears >50% stenosed.                    Vertebrals: Right vertebral artery demonstrates antegrade                   flow. Left vertebral artery demonstrates bidirectional flow.                   Subclavians: Normal flow hemodynamics were seen in bilateral  subclavian arteries. Performing Technologist: Gerome Ny RVT  Examination Guidelines: A complete evaluation includes B-mode imaging, spectral Doppler, color Doppler, and power Doppler as needed of all accessible portions of each vessel. Bilateral testing is considered an integral part of a complete examination. Limited examinations for reoccurring indications may be performed as noted.  Right Carotid Findings: +----------+--------+--------+--------+--------------------------+--------+           PSV cm/sEDV cm/sStenosisDescribe                  Comments +----------+--------+--------+--------+--------------------------+--------+ CCA Prox  55      7               irregular and heterogenous          +----------+--------+--------+--------+--------------------------+--------+ CCA Distal57      12              smooth and heterogenous            +----------+--------+--------+--------+--------------------------+--------+ ICA Prox  153     17              calcific                           +----------+--------+--------+--------+--------------------------+--------+ ICA Mid   142     32              smooth and heterogenous            +----------+--------+--------+--------+--------------------------+--------+ ICA Distal120     30                                                 +----------+--------+--------+--------+--------------------------+--------+ ECA       108     1               calcific                           +----------+--------+--------+--------+--------------------------+--------+ +----------+--------+-------+--------+------------+           PSV cm/sEDV cmsDescribeArm Pressure +----------+--------+-------+--------+------------+ [id number]                                 +----------+--------+-------+--------+------------+ +---------+--------+--+--------+--+---------+ VertebralPSV cm/s51EDV cm/s15Antegrade +---------+--------+--+--------+--+---------+ Left Carotid Findings: +----------+--------+--------+--------+--------------------------+--------+           PSV cm/sEDV cm/sStenosisDescribe                  Comments +----------+--------+--------+--------+--------------------------+--------+ CCA Prox  80      15              irregular and heterogenous         +----------+--------+--------+--------+--------------------------+--------+ CCA Distal65      9               irregular and heterogenous         +----------+--------+--------+--------+--------------------------+--------+ ICA Prox  94      22              calcific                           +----------+--------+--------+--------+--------------------------+--------+ ICA Mid    45      11  tortuous +----------+--------+--------+--------+--------------------------+--------+ ICA Distal66      18                                        tortuous +----------+--------+--------+--------+--------------------------+--------+ ECA       310     0                                                  +----------+--------+--------+--------+--------------------------+--------+ +----------+--------+--------+--------+------------+ SubclavianPSV cm/sEDV cm/sDescribeArm Pressure +----------+--------+--------+--------+------------+           150                                  +----------+--------+--------+--------+------------+ +---------+--------+--+--------+-+--------+ VertebralPSV cm/s15EDV cm/s3Atypical +---------+--------+--+--------+-+--------+  ABI Findings: +------------------+-----+----------+ Rt Pressure (mmHg)IndexWaveform   +------------------+-----+----------+ 160                    triphasic  +------------------+-----+----------+ 124               0.78 monophasic +------------------+-----+----------+ 117               0.73 monophasic +------------------+-----+----------+ +------------------+-----+----------+ Lt Pressure (mmHg)IndexWaveform   +------------------+-----+----------+ 157                    triphasic  +------------------+-----+----------+ 254               1.59 monophasic +------------------+-----+----------+ 113               0.71 monophasic +------------------+-----+----------+ +-------+---------------+ ABI/TBIToday's ABI/TBI +-------+---------------+ Right  0.78            +-------+---------------+ Left   0.71            +-------+---------------+  Right Doppler Findings: +--------+--------+---------+ Site    PressureDoppler   +--------+--------+---------+ [id number]     triphasic +--------+--------+---------+ Radial          triphasic  +--------+--------+---------+ Ulnar           triphasic +--------+--------+---------+  Left Doppler Findings: +--------+--------+---------+ Site    PressureDoppler   +--------+--------+---------+ [id number]     triphasic +--------+--------+---------+ Radial          triphasic +--------+--------+---------+ Ulnar           triphasic +--------+--------+---------+   Summary: Right Carotid: Velocities detected in the stented right ICA suggest a 50 - 75%                stenosis. Left Carotid: Velocities in the left ICA are consistent with a 1-39% stenosis. Vertebrals: Right vertebral artery demonstrates antegrade flow. Left vertebral             artery demonstrates bidirectional flow. Right ABI: Resting right ankle-brachial index indicates moderate right lower extremity arterial disease. Left ABI: Resting left ankle-brachial index indicates moderate left lower extremity arterial disease. Right Upper Extremity: Doppler waveforms remain within normal limits with right radial compression. Doppler waveforms remain within normal limits with right ulnar compression. Left Upper Extremity: Doppler waveforms decrease >50% with left radial compression. Doppler waveforms remain within normal limits with left ulnar compression.  Electronically signed by Lonni Gaskins MD on 04/24/2024 at 3:16:10 PM.    Final    CARDIAC CATHETERIZATION Result Date: 04/21/2024   Prox RCA-1  lesion is 90% stenosed.   Prox RCA-2 lesion is 90% stenosed.   Mid RCA lesion is 90% stenosed.   RPDA lesion is 40% stenosed.   1st Mrg lesion is 95% stenosed.   Prox LAD to Mid LAD lesion is 50% stenosed.   Mid LAD lesion is 80% stenosed. Severe three vessel CAD Moderate calcified stenosis throughout the proximal LAD. Severe mid LAD stenosis Severe calcified stenosis in the large caliber obtuse marginal branch Dominant RCA with severe calcified lesion in the proximal and mid vessel. Elevated right heart pressures with wedge pressure 30-35  mmHg Recommendations: Will need to consider CABG/AVR given multi-vessel CAD and severe AS. 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 Currently with uncontrolled atrial fib on amiodarone  and volume overload. Will hold Plavix  and ask CT surgery to see her.    Disposition Patient is being discharged home today in good condition.  Follow-up Plans & Appointments  Follow-up Information     Verlin Lonni BIRCH, MD Follow up.   Specialty: Cardiology Why: Hospital follow-up with Cardiology scheduled for 05/24/2024 at 10:40am. Contact information: [address] [place] [address] [phone]                Discharge Instructions     Amb Referral to Cardiac Rehabilitation   Complete by: As directed    Diagnosis: Coronary Stents   After initial evaluation and assessments completed: Virtual Based Care may be provided alone or in conjunction with Phase 2 Cardiac Rehab based on patient barriers.: Yes   Intensive Cardiac Rehabilitation (ICR) [facility] location only OR Traditional Cardiac Rehabilitation (TCR) *If criteria for ICR are not met will enroll in TCR [facility] only): Yes   Increase activity slowly   Complete by: As directed        Discharge Medications Allergies as of 05/19/2024       Reactions   Statins Other (See Comments), Cough   Not all statins but some cause cough and pain in legs.   Jardiance  [empagliflozin ] Other (See Comments)   Recurrent vaginitis   Quinapril Hcl Cough   Tape Other (See Comments)   Redness, please use paper tape        Medication List     PAUSE taking these medications    metFORMIN  1000 MG tablet Wait to take this until: May 20, 2024 Commonly known as: GLUCOPHAGE  Take 1 tablet (1,000 mg total) by mouth 2 (two) times daily with a meal. What changed:  how much to take additional instructions       STOP taking these medications    fluticasone  50 MCG/ACT nasal spray Commonly known as: FLONASE     linaclotide  290 MCG Caps capsule Commonly known as: Linzess    pravastatin  40 MG tablet Commonly known as: PRAVACHOL        TAKE these medications    rOPINIRole  0.5 MG tablet Commonly known as: REQUIP  TAKE 1 TABLET BY MOUTH AT BEDTIME. The timing of this medication is very important.   amiodarone  200 MG tablet Commonly known as: PACERONE  Take 1 tablet (200 mg total) by mouth daily. Start taking on: May 20, 2024 What changed: See the new instructions.   aspirin  EC 81 MG tablet Take 1 tablet (81 mg total) by mouth daily. Swallow whole.   clopidogrel  75 MG tablet Commonly known as: PLAVIX  TAKE 1 TABLET BY MOUTH EVERY DAY   diphenhydramine -acetaminophen  25-500 MG Tabs tablet Commonly known as: TYLENOL  PM Take 1 tablet by mouth at bedtime as needed (pain).  ezetimibe  10 MG tablet Commonly known as: ZETIA  TAKE 1 TABLET BY MOUTH EVERY DAY   furosemide  40 MG tablet Commonly known as: LASIX  Take 1 tablet (40 mg total) by mouth daily.   gabapentin  300 MG capsule Commonly known as: NEURONTIN  Take 1 capsule (300 mg total) by mouth 2 (two) times daily.   levonorgestrel 20 MCG/DAY Iud Commonly known as: MIRENA 1 each by Intrauterine route once.   levothyroxine  112 MCG tablet Commonly known as: SYNTHROID  TAKE 1 TABLET BY MOUTH EVERY OTHER DAY, ALTERNATING WITH 1 & 1/2 TABLETS EVERY OTHER DAY What changed: See the new instructions.   losartan  25 MG tablet Commonly known as: COZAAR  Take 1 tablet (25 mg total) by mouth daily.   metoprolol  succinate 25 MG 24 hr tablet Commonly known as: TOPROL -XL Take 1 tablet (25 mg total) by mouth at bedtime. What changed:  medication strength how much to take   polyethylene glycol 17 g packet Commonly known as: MIRALAX  / GLYCOLAX  Take 17 g by mouth daily as needed for moderate constipation.   rosuvastatin  20 MG tablet Commonly known as: CRESTOR  Take 1 tablet (20 mg total) by mouth daily. Start taking on: May 20, 2024   Semaglutide (0.25 or 0.5MG /DOS) 2 MG/3ML Sopn Inject 0.5 mg into the skin once a week. What changed: additional instructions   spironolactone  25 MG tablet Commonly known as: ALDACTONE  Take 1 tablet (25 mg total) by mouth daily.         Outstanding Labs/Studies   Duration of Discharge Encounter: APP Time: 25 minutes   Signed, Aalina Brege E [place], PA-C 05/19/2024, 5:29 PM     "

## 2024-05-19 NOTE — Progress Notes (Signed)
 CARDIAC REHAB PHASE I   PRE:  Rate/Rhythm: 60 SR    BP: sitting 143/51    SpO2:   MODE:  Ambulation: 230 ft   POST:  Rate/Rhythm: 100 ST    BP: sitting 190/56     SpO2:    Pt tolerated well, no c/o. Her HR did get high with distance. Discussed with pt and niece not pushing herself too hard at home before her TAVR. Will place referral for CRPII however she needs her TAVR first.   She has questions regarding driving and timing of TAVR. [phone]  Aliene Aris BS, ACSM-CEP 05/19/2024 10:17 AM

## 2024-05-19 NOTE — Discharge Instructions (Addendum)
 Medication Changes: - CONTINUE Aspirin  81mg  daily and Plavix  75mg  daily.  - DECREASE Amiodarone  to 200mg  once daily. - DECREASE Metoprolol  succinate (Topro-XL) to 25mg  once daily. - STOP Pravastatin  and START Rosuvastatin  (Crestor ) 20mg  daily. - RESTART Metformin  48 hours after cardiac catheterization (05/20/2024).  Post Cardiac Catheterization: NO HEAVY LIFTING OR SEXUAL ACTIVITY X 7 DAYS. NO DRIVING X 3-5 DAYS. NO SOAKING BATHS, HOT TUBS, POOLS, ETC., X 7 DAYS.  Radial Site Care: Refer to this sheet in the next few weeks. These instructions provide you with information on caring for yourself after your procedure. Your caregiver may also give you more specific instructions. Your treatment has been planned according to current medical practices, but problems sometimes occur. Call your caregiver if you have any problems or questions after your procedure. HOME CARE INSTRUCTIONS You may shower the day after the procedure. Remove the bandage (dressing) and gently wash the site with plain soap and water . Gently pat the site dry.  Do not apply powder or lotion to the site.  Do not submerge the affected site in water  for 3 to 5 days.  Inspect the site at least twice daily.  Do not flex or bend the affected arm for 24 hours.  No lifting over 5 pounds (2.3 kg) for 5 days after your procedure.  Do not drive home if you are discharged the same day of the procedure. Have someone else drive you.  What to expect: Any bruising will usually fade within 1 to 2 weeks.  Blood that collects in the tissue (hematoma) may be painful to the touch. It should usually decrease in size and tenderness within 1 to 2 weeks.  SEEK IMMEDIATE MEDICAL CARE IF: You have unusual pain at the radial site.  You have redness, warmth, swelling, or pain at the radial site.  You have drainage (other than a small amount of blood on the dressing).  You have chills.  You have a fever or persistent symptoms for more than 72 hours.   You have a fever and your symptoms suddenly get worse.  Your arm becomes pale, cool, tingly, or numb.  You have heavy bleeding from the site. Hold pressure on the site.

## 2024-05-22 ENCOUNTER — Telehealth: Payer: Self-pay | Admitting: *Deleted

## 2024-05-22 ENCOUNTER — Telehealth: Payer: Self-pay

## 2024-05-22 ENCOUNTER — Telehealth (HOSPITAL_COMMUNITY): Payer: Self-pay

## 2024-05-22 NOTE — Transitions of Care (Post Inpatient/ED Visit) (Signed)
 "  05/22/2024  Name: Emily [name] MRN: [id number] DOB: 11-28-1950  Today's TOC FU Call Status: Today's TOC FU Call Status:: Successful TOC FU Call Completed TOC FU Call Complete Date: 05/22/24  Patient's Name and Date of Birth confirmed. Name, DOB  Transition Care Management Follow-up Telephone Call Discharge Facility: [facility]) Type of Discharge: Inpatient Admission Primary Inpatient Discharge Diagnosis:: coronary artery disease How have you been since you were released from the hospital?: Better (eating, drinking well, independent with ADL, IADL's) Any questions or concerns?: No  Items Reviewed: Did you receive and understand the discharge instructions provided?: Yes Medications obtained,verified, and reconciled?: Yes (Medications Reviewed) Any new allergies since your discharge?: No Dietary orders reviewed?: Yes Type of Diet Ordered:: heart healthy, carbohydrate modified Do you have support at home?: Yes People in Home [RPT]: alone Name of Support/Comfort Primary Source: friends/ family  Medications Reviewed Today: Medications Reviewed Today     Reviewed by Aura Mliss LABOR, RN (Registered Nurse) on 05/22/24 at 1204  Med List Status: <None>   Medication Order Taking? Sig Documenting Provider Last Dose Status Informant  amiodarone  (PACERONE ) 200 MG tablet [id number] Yes Take 1 tablet (200 mg total) by mouth daily. Goodrich, Callie E, PA-C  Active   aspirin  EC 81 MG tablet [id number] Yes Take 1 tablet (81 mg total) by mouth daily. Swallow whole. Verlin Lonni BIRCH, MD  Active Self, Pharmacy Records  clopidogrel  (PLAVIX ) 75 MG tablet [id number] Yes TAKE 1 TABLET BY MOUTH EVERY DAY Bethanie Cough, PA-C  Active Self, Pharmacy Records  cyanocobalamin  ((VITAMIN B-12)) injection 1,000 mcg [id number]   Harl Jayson CROME, MD  Active   diphenhydramine -acetaminophen  (TYLENOL  PM) 25-500 MG TABS tablet [id number] Yes Take 1 tablet by mouth at bedtime as needed (pain).  [provider]  Active Self, Pharmacy Records  ezetimibe  (ZETIA ) 10 MG tablet [id number] Yes TAKE 1 TABLET BY MOUTH EVERY DAY Lynwood Schilling, MD  Active Self, Pharmacy Records  furosemide  (LASIX ) 40 MG tablet [id number] Yes Take 1 tablet (40 mg total) by mouth daily. Gherghe, Costin M, MD  Active Self, Pharmacy Records  gabapentin  (NEURONTIN ) 300 MG capsule [id number] Yes Take 1 capsule (300 mg total) by mouth 2 (two) times daily. Goodrich, Callie E, PA-C  Active   levonorgestrel (MIRENA) 20 MCG/DAY IUD [id number] Yes 1 each by Intrauterine route once. [provider]  Active Self, Pharmacy Records  levothyroxine  (SYNTHROID ) 112 MCG tablet [id number] Yes TAKE 1 TABLET BY MOUTH EVERY OTHER DAY, ALTERNATING WITH 1 & 1/2 TABLETS EVERY OTHER DAY  Patient taking differently: Take 112 mcg by mouth See admin instructions. TAKE 1 TABLET BY MOUTH EVERY OTHER DAY, ALTERNATING WITH 1 & 1/2 TABLETS EVERY OTHER DAY   Gottschalk, Ashly M, DO  Active Self, Pharmacy Records           Med Note [facility], ALI   Fri May 12, 2024  4:02 PM)    losartan  (COZAAR ) 25 MG tablet [id number] Yes Take 1 tablet (25 mg total) by mouth daily. Jolinda Norene HERO, DO  Active Self, Pharmacy Records  metFORMIN  (GLUCOPHAGE ) 1000 MG tablet [id number] Yes Take 1 tablet (1,000 mg total) by mouth 2 (two) times daily with a meal.  Patient taking differently: Take 500 mg by mouth 2 (two) times daily with a meal. 500 mg BID   Jolinda Norene HERO, DO  Active Self, Pharmacy Records           Med Note JACKOLYN WADDELL DEL   Tue Apr 04, 2024  8:43 AM)    metoprolol  succinate (TOPROL -XL) 25 MG 24 hr tablet [id number] Yes Take 1 tablet (25 mg total) by mouth at bedtime. Goodrich, Callie E, PA-C  Active   polyethylene glycol (MIRALAX  / GLYCOLAX ) 17 g packet [id number] Yes Take 17 g by mouth daily as needed for moderate constipation. [provider]  Active Self, Pharmacy Records  rOPINIRole  (REQUIP ) 0.5 MG tablet [id number] Yes TAKE  1 TABLET BY MOUTH AT BEDTIME. Jolinda Norene HERO, DO  Active Self, Pharmacy Records  rosuvastatin  (CRESTOR ) 20 MG tablet [id number] Yes Take 1 tablet (20 mg total) by mouth daily. Goodrich, Callie E, PA-C  Active   Semaglutide ,0.25 or 0.5MG /DOS, 2 MG/3ML SOPN [id number] Yes Inject 0.5 mg into the skin once a week. Jolinda Norene HERO, DO  Active Self, Pharmacy Records           Med Note SOILA LYLE BROCKS   Fri Mar 05, 2023 12:44 PM)    spironolactone  (ALDACTONE ) 25 MG tablet [id number] Yes Take 1 tablet (25 mg total) by mouth daily. Trixie Nilda HERO, MD  Active Self, Pharmacy Records            Home Care and Equipment/Supplies: Were Home Health Services Ordered?: No Any new equipment or medical supplies ordered?: No  Functional Questionnaire: Do you need assistance with bathing/showering or dressing?: No Do you need assistance with meal preparation?: No Do you need assistance with eating?: No Do you have difficulty maintaining continence: No Do you need assistance with getting out of bed/getting out of a chair/moving?: No Do you have difficulty managing or taking your medications?: No  Follow up appointments reviewed: PCP Follow-up appointment confirmed?: No (pt declines to schedule, states she will follow up with cardiology this week) MD Provider Line [phone] Given: No Specialist Hospital Follow-up appointment confirmed?: Yes Date of Specialist follow-up appointment?: 05/24/24 Follow-Up Specialty Provider:: cardiology  Lonni Cash Do you need transportation to your follow-up appointment?: No Do you understand care options if your condition(s) worsen?: Yes-patient verbalized understanding  SDOH Interventions Today    Flowsheet Row Most Recent Value  SDOH Interventions   Food Insecurity Interventions Intervention Not Indicated  Housing Interventions Intervention Not Indicated  Transportation Interventions Intervention Not Indicated  Utilities Interventions  Intervention Not Indicated    Goals Addressed             This Visit's Progress    VBCI Transitions of Care (TOC) Care Plan       Problems:  Recent Hospitalization for treatment of CAD and  coronary stent intervention No Hospital Follow Up Provider appointment pt does not have post hospital follow up appointment Pt reports she is weighing, checking heart rate and blood pressure daily and keeping a log, pt is wearing event monitor, pt reports  I feel much better, plans to go to cardiac rehab  Goal:  Over the next 30 days, the patient will not experience hospital readmission  Interventions: CAD Interventions: Counseled on increased risk of stroke due to Afib and benefits of anticoagulation for stroke prevention Reviewed importance of adherence to anticoagulant exactly as prescribed Counseled on importance of regular laboratory monitoring as prescribed Reviewed signs /symptoms MI, seeking emergency medical help immediately Encouraged pt to continue daily weights, BP, HR and keep a log, take log to all provider appointments Reviewed medications, importance of taking as prescribed Reviewed all upcoming scheduled appointments including cardiology 05/24/24 @ 1040 am SDOH screenings completed  Patient Self Care Activities:  Attend all scheduled provider appointments Call pharmacy for  medication refills 3-7 days in advance of running out of medications Call provider office for new concerns or questions  Notify RN Care Manager of TOC call rescheduling needs Participate in Transition of Care Program/Attend TOC scheduled calls Perform all self care activities independently  Perform IADL's (shopping, preparing meals, housekeeping, managing finances) independently Take medications as prescribed   begin a symptom diary bring symptom diary to all appointments check pulse (heart) rate before taking medicine make a plan to eat healthy keep all lab appointments take medicine as  prescribed wear medical alert identification Continue to weigh daily, check blood pressure, heart rate and keep a log, take log to all provider appointments  Plan:  Telephone outreach 05/30/24 @ 115 pm        Mliss Creed [facility], BSN RN Care Manager/ Transition of Care [place]/ [facility] [phone]  "

## 2024-05-22 NOTE — Telephone Encounter (Signed)
 Called patient to see if she was interested in Cardiac rehab program.  She stated that she might be but that she could not commit to it right now.  Closed referral.

## 2024-05-22 NOTE — Telephone Encounter (Signed)
 Patient contacted regarding discharge from [facility] on 05/19/24.  Patient understands to follow up with provider Dr. Verlin on 05/24/24 at 10:40AM at [address] Location. Patient understands discharge instructions? Yes Patient understands medications and regiment? Yes Patient understands to bring all medications to this visit? Yes

## 2024-05-23 NOTE — Progress Notes (Signed)
 "  Structural Heart Clinic Note  Chief Complaint  Patient presents with   Follow-up    CAD, AS   History of Present Illness: 73 yo female with history of PAF s/p Watchman due to recurrent GI bleeding, bifasciular block (RBBB, LAFB), DM type 2 with peripheral neuropathy, HTN, HLD, carotid artery disease s/p L CEA (2017) and right TCAR (03/2023) with 50-75% stenosis of stented RICA, severe asymmetric LVH (cMRI felt to be 2/2 AS), severe 3V CAD, ischemic cardiomyopathy, chronic systolic CHF and severe low flow/low gradient aortic stenosis who is here today for hospital follow up. Echo July 2025 with LVEF 60-65%, severe asymmetrical LVH, normal RV, severe LAE. Moderate to severe aortic stenosis with AVA 0.74 cm2, mean gradient , DI 0.23 SVI 42. Cardiac MRI October 2025 with asymmetrical septal LVH 1.6 cm, LVE 62%. She was referred to structural heart for consultation and had an appointment with Dr. Wonda on 04/18/24, however, she was admitted that day for worsening LE edema and SOB. Found to have acute CHF in the setting of atrial fibrillation with RVR. She was diuresed with IV lasix  and treated with IV amiodarone  with eventual conversion to NSR. She is now on po amiodarone . Echo 04/18/24 with LVEF=45-50%, severe LVH, mild to mod Mitral stenosis, mild MR, severe low flow/low gradient aortic stenosis with mean gradient 28 mmHg, AVA 0.6 cm2, DI 0.23, SVI 23. Right and left heart cath 04/21/24 with three vessel CAD. The RCA has three severe calcified lesions in the proxima and mid vessel. The LAD and OM branch have moderately severe lesions. Carotid/extremity dopplers 04/25/24 with 50-75% stenosis in stented RICA and moderate bilateral LE PAD. She was seen by Dr. Daniel for evaluation of CABG/AVR given multi-vessel CAD and severe AS. Her STS risk calculated score of morbidity and mortality is quite high at 26.9%.  She also voiced a preference for a minimally invasive approach given lack of strong family support.  CTs also showed a calcified aorta making her higher surgical risk. TAVR CTs completed 04/24/24: Cardiac gated CTA of the heart revealed anatomical characteristics consistent with aortic stenosis suitable for treatment by transcatheter aortic valve replacement without any significant complicating features. She sizes to a 23 mm Sapien 3 Ultra Resilia valve. CTA of the aorta and iliac vessels demonstrated what appear to be adequate pelvic vascular access to facilitate a left transfemoral approach. She will be at increased risk for HAVB given issues with tachy-brady and underlying bifasciular block (RBBB, LAFB).   She underwent PCI of the RCA with orbital atherectomy and stenting of the proximal, mid and distal RCA on 05/18/24. We have elected for medical management of the disease in her LAD and Circumflex/OM.   She is here today for follow up. The patient denies any chest pain, dyspnea, palpitations, lower extremity edema, orthopnea, PND, dizziness, near syncope or syncope.   Ms. Racicot lives independently and works as a interior and spatial designer.  She walks unassisted and able to take care of all her own ADLs but is overall not very active. Her only family support are nieces and nephews. She gets regular dental work.      Primary Care Physician: Jolinda Norene HERO, DO Primary Cardiologist: Lavona Referring Cardiologist: Lavona  Past Medical History:  Diagnosis Date   Anemia    Aortic stenosis    Arthritis    Bilateral carotid artery disease    CAD (coronary artery disease)    Complication of anesthesia    difficult intubation in past   Diabetes mellitus without  complication (HCC)    type 2   Difficult intubation    states 'lady that did the sleep study told me I have the smallest airway she has ever seen in an adult   Dyslipidemia 06/29/2017   HFrEF (heart failure with reduced ejection fraction) (HCC)    History of blood transfusion 09/2019   GI bleed - in [facility]   Hypertension    Hypothyroid     Obesity    Obstructive sleep apnea    occasional uses CPAP   Peripheral vascular disease    Persistent atrial fibrillation (HCC) 01/16/2020   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 - xrays were negative   Upper GI bleed 09/25/2019    Past Surgical History:  Procedure Laterality Date   CATARACT EXTRACTION Left    CATARACT EXTRACTION W/PHACO Right 12/09/2015   Procedure: CATARACT EXTRACTION PHACO AND INTRAOCULAR LENS PLACEMENT (IOC);  Surgeon: Cherene Mania, MD;  Location: [facility];  Service: Ophthalmology;  Laterality: Right;  CDE:10.48   COLONOSCOPY W/ POLYPECTOMY     CORONARY ATHERECTOMY N/A 05/17/2024   Procedure: CORONARY ATHERECTOMY;  Surgeon: Verlin Lonni BIRCH, MD;  Location: [facility];  Service: Cardiovascular;  Laterality: N/A;   CORONARY LITHOTRIPSY N/A 05/17/2024   Procedure: CORONARY LITHOTRIPSY;  Surgeon: Verlin Lonni BIRCH, MD;  Location: [facility];  Service: Cardiovascular;  Laterality: N/A;   CORONARY STENT INTERVENTION N/A 05/17/2024   Procedure: CORONARY STENT INTERVENTION;  Surgeon: Verlin Lonni BIRCH, MD;  Location: [facility];  Service: Cardiovascular;  Laterality: N/A;   cyst on back of neck removed     DILATION AND CURETTAGE OF UTERUS     x 5   ENDARTERECTOMY Left 08/30/2015   Procedure: LEFT CAROYID ENDARTERECTOMY WITH XENOSURE BOVINE PERICARDIUM PATCH ANGIOPLASTY;  Surgeon: Gaile LELON New, MD;  Location: [facility];  Service: Vascular;  Laterality: Left;   RIGHT/LEFT HEART CATH AND CORONARY ANGIOGRAPHY N/A 04/21/2024   Procedure: RIGHT/LEFT HEART CATH AND CORONARY ANGIOGRAPHY;  Surgeon: Verlin Lonni BIRCH, MD;  Location: [facility];  Service: Cardiovascular;  Laterality: N/A;   TONSILLECTOMY     TRANSCAROTID ARTERY REVASCULARIZATION  Right 03/19/2023   Procedure: Right Transcarotid Artery Revascularization;  Surgeon: New Gaile LELON, MD;  Location: [facility];  Service: Vascular;  Laterality:  Right;   ULTRASOUND GUIDANCE FOR VASCULAR ACCESS Left 03/19/2023   Procedure: ULTRASOUND GUIDANCE FOR VASCULAR ACCESS;  Surgeon: New Gaile LELON, MD;  Location: [facility];  Service: Vascular;  Laterality: Left;   UPPER GI ENDOSCOPY     x several    Current Outpatient Medications  Medication Sig Dispense Refill   amiodarone  (PACERONE ) 200 MG tablet Take 1 tablet (200 mg total) by mouth daily.     aspirin  EC 81 MG tablet Take 1 tablet (81 mg total) by mouth daily. Swallow whole.     clopidogrel  (PLAVIX ) 75 MG tablet TAKE 1 TABLET BY MOUTH EVERY DAY 90 tablet 2   diphenhydramine -acetaminophen  (TYLENOL  PM) 25-500 MG TABS tablet Take 1 tablet by mouth at bedtime as needed (pain).     ezetimibe  (ZETIA ) 10 MG tablet TAKE 1 TABLET BY MOUTH EVERY DAY 90 tablet 2   furosemide  (LASIX ) 40 MG tablet Take 1 tablet (40 mg total) by mouth daily. 90 tablet 0   gabapentin  (NEURONTIN ) 300 MG capsule Take 1 capsule (300 mg total) by mouth 2 (two) times daily.     levonorgestrel (MIRENA) 20 MCG/DAY IUD 1 each by Intrauterine  route once.     levothyroxine  (SYNTHROID ) 112 MCG tablet TAKE 1 TABLET BY MOUTH EVERY OTHER DAY, ALTERNATING WITH 1 & 1/2 TABLETS EVERY OTHER DAY (Patient taking differently: Take 112 mcg by mouth See admin instructions. TAKE 1 TABLET BY MOUTH EVERY OTHER DAY, ALTERNATING WITH 1 & 1/2 TABLETS EVERY OTHER DAY) 112 tablet 3   losartan  (COZAAR ) 25 MG tablet Take 1 tablet (25 mg total) by mouth daily. 90 tablet 2   metFORMIN  (GLUCOPHAGE ) 1000 MG tablet Take 1 tablet (1,000 mg total) by mouth 2 (two) times daily with a meal. (Patient taking differently: Take 500 mg by mouth 2 (two) times daily with a meal. 500 mg BID) 180 tablet 3   metoprolol  succinate (TOPROL -XL) 25 MG 24 hr tablet Take 1 tablet (25 mg total) by mouth at bedtime. 30 tablet 2   polyethylene glycol (MIRALAX  / GLYCOLAX ) 17 g packet Take 17 g by mouth daily as needed for moderate constipation.     rOPINIRole  (REQUIP ) 0.5 MG tablet TAKE 1  TABLET BY MOUTH AT BEDTIME. 90 tablet 0   rosuvastatin  (CRESTOR ) 20 MG tablet Take 1 tablet (20 mg total) by mouth daily. 30 tablet 2   Semaglutide ,0.25 or 0.5MG /DOS, 2 MG/3ML SOPN Inject 0.5 mg into the skin once a week. 3 mL    spironolactone  (ALDACTONE ) 25 MG tablet Take 1 tablet (25 mg total) by mouth daily. 90 tablet 0   Current Facility-Administered Medications  Medication Dose Route Frequency Provider Last Rate Last Admin   cyanocobalamin  ((VITAMIN B-12)) injection 1,000 mcg  1,000 mcg Intramuscular Q30 days Harl Jayson CROME, MD   1,000 mcg at 05/05/24 1140    Allergies  Allergen Reactions   Statins Other (See Comments) and Cough    Not all statins but some cause cough and pain in legs.   Jardiance  [Empagliflozin ] Other (See Comments)    Recurrent vaginitis   Quinapril Hcl Cough   Tape Other (See Comments)    Redness, please use paper tape    Social History   Socioeconomic History   Marital status: Single    Spouse name: Not on file   Number of children: Not on file   Years of education: Not on file   Highest education level: Not on file  Occupational History   Not on file  Tobacco Use   Smoking status: Never    Passive exposure: Never   Smokeless tobacco: Never  Vaping Use   Vaping status: Never Used  Substance and Sexual Activity   Alcohol use: No    Alcohol/week: 0.0 standard drinks of alcohol   Drug use: Never   Sexual activity: Not Currently    Birth control/protection: Post-menopausal  Other Topics Concern   Not on file  Social History Narrative   Not on file   Social Drivers of Health   Tobacco Use: Low Risk (05/24/2024)   Patient History    Smoking Tobacco Use: Never    Smokeless Tobacco Use: Never    Passive Exposure: Never  Financial Resource Strain: Low Risk (04/21/2022)   Overall Financial Resource Strain (CARDIA)    Difficulty of Paying Living Expenses: Not hard at all  Food Insecurity: No Food Insecurity (05/22/2024)   Epic     Worried About Radiation Protection Practitioner of Food in the Last Year: Never true    Ran Out of Food in the Last Year: Never true  Transportation Needs: No Transportation Needs (05/22/2024)   Epic    Lack of Transportation (Medical): No  Lack of Transportation (Non-Medical): No  Physical Activity: Inactive (04/21/2022)   Exercise Vital Sign    Days of Exercise per Week: 0 days    Minutes of Exercise per Session: 0 min  Stress: No Stress Concern Present (04/21/2022)   [employer] of Occupational Health - Occupational Stress Questionnaire    Feeling of Stress : Not at all  Social Connections: Moderately Integrated (05/17/2024)   Social Connection and Isolation Panel    Frequency of Communication with Friends and Family: More than three times a week    Frequency of Social Gatherings with Friends and Family: More than three times a week    Attends Religious Services: More than 4 times per year    Active Member of [employer] or Organizations: Yes    Attends Banker Meetings: More than 4 times per year    Marital Status: Never married  Intimate Partner Violence: Not At Risk (05/22/2024)   Epic    Fear of Current or Ex-Partner: No    Emotionally Abused: No    Physically Abused: No    Sexually Abused: No  Depression (PHQ2-9): Low Risk (05/22/2024)   Depression (PHQ2-9)    PHQ-2 Score: 0  Alcohol Screen: Not on file  Housing: Unknown (05/22/2024)   Epic    Unable to Pay for Housing in the Last Year: No    Number of Times Moved in the Last Year: Not on file    Homeless in the Last Year: No  Utilities: Not At Risk (05/22/2024)   Epic    Threatened with loss of utilities: No  Health Literacy: Not on file    Family History  Problem Relation Age of Onset   COPD Father    Heart failure Father    Heart disease Father    Emphysema Father    Arrhythmia Sister    Arrhythmia Sister    Arrhythmia Sister        had PPM also   Cancer Sister     Review of Systems:  As stated in the HPI  and otherwise negative.   BP 130/82   Pulse (!) 58   Ht 5' 2.5 (1.588 m)   Wt 184 lb (83.5 kg)   SpO2 96%   BMI 33.12 kg/m   Physical Examination: General: Well developed, well nourished, NAD  HEENT: OP clear, mucus membranes moist  SKIN: warm, dry. No rashes. Neuro: No focal deficits  Musculoskeletal: Muscle strength 5/5 all ext  Psychiatric: Mood and affect normal  Neck: No JVD, no carotid bruits, no thyromegaly, no lymphadenopathy.  Lungs:Clear bilaterally, no wheezes, rhonci, crackles Cardiovascular: Regular rate and rhythm. Harsh systolic murmur.  Abdomen:Soft. Bowel sounds present. Non-tender.  Extremities: No lower extremity edema. Pulses are 2 + in the bilateral DP/PT.  EKG:  EKG is not ordered today. The ekg ordered today demonstrates   Echo 04/18/24:  1. Left ventricular ejection fraction, by estimation, is 45 to 50%. The  left ventricle has mildly decreased function. Left ventricular endocardial  border not optimally defined to evaluate regional wall motion. There is  severe left ventricular  hypertrophy. Left ventricular diastolic parameters are indeterminate.   2. Right ventricular systolic function is normal. The right ventricular  size is normal. Tricuspid regurgitation signal is inadequate for assessing  PA pressure.   3. Left atrial size was mildly dilated.   4. The mitral valve is abnormal. Mild mitral valve regurgitation. Mild to  moderate mitral stenosis. The mean mitral valve gradient is 5.0 mmHg.  Moderate mitral annular calcification.   5. AVA VTI 0.61, mean gradient 28 mmHg, DI 0.23, SVI 23. Severe aortic  stenosis with lower than expected gradient due to decreased stroke volume  index. . The aortic valve is tricuspid. There is moderate calcification of  the aortic valve. There is  moderate thickening of the aortic valve. Aortic valve regurgitation is not  visualized. Severe aortic valve stenosis.   6. The inferior vena cava is dilated in size  with >50% respiratory  variability, suggesting right atrial pressure of 8 mmHg.   FINDINGS   Left Ventricle: Left ventricular ejection fraction, by estimation, is 45  to 50%. The left ventricle has mildly decreased function. Left ventricular  endocardial border not optimally defined to evaluate regional wall motion.  The left ventricular internal  cavity size was normal in size. There is severe left ventricular  hypertrophy. Left ventricular diastolic parameters are indeterminate.   Right Ventricle: The right ventricular size is normal. Right vetricular  wall thickness was not well visualized. Right ventricular systolic  function is normal. Tricuspid regurgitation signal is inadequate for  assessing PA pressure.   Left Atrium: Left atrial size was mildly dilated.   Right Atrium: Right atrial size was normal in size.   Pericardium: There is no evidence of pericardial effusion.   Mitral Valve: The mitral valve is abnormal. There is moderate thickening  of the mitral valve leaflet(s). There is moderate calcification of the  mitral valve leaflet(s). Moderate mitral annular calcification. Mild  mitral valve regurgitation. Mild to  moderate mitral valve stenosis. The mean mitral valve gradient is 5.0  mmHg.   Tricuspid Valve: The tricuspid valve is not well visualized. Tricuspid  valve regurgitation is not demonstrated. No evidence of tricuspid  stenosis.   Aortic Valve: AVA VTI 0.61, mean gradient 28 mmHg, DI 0.23, SVI 23. Severe  aortic stenosis with lower than expected gradient due to decreased stroke  volume index. The aortic valve is tricuspid. There is moderate  calcification of the aortic valve. There  is moderate thickening of the aortic valve. There is moderate aortic valve  annular calcification. Aortic valve regurgitation is not visualized.  Severe aortic stenosis is present. Aortic valve mean gradient measures  27.5 mmHg. Aortic valve peak gradient  measures 42.6 mmHg.  Aortic valve area, by VTI measures 0.61 cm.   Pulmonic Valve: The pulmonic valve was not well visualized. Pulmonic valve  regurgitation is mild. No evidence of pulmonic stenosis.   Aorta: The aortic root and ascending aorta are structurally normal, with  no evidence of dilitation.   Venous: The inferior vena cava is dilated in size with greater than 50%  respiratory variability, suggesting right atrial pressure of 8 mmHg.   IAS/Shunts: No atrial level shunt detected by color flow Doppler.     LEFT VENTRICLE  PLAX 2D  LVIDd:         4.40 cm   Diastology  LVIDs:         3.50 cm   LV e' lateral:   3.70 cm/s  LV PW:         1.50 cm   LV E/e' lateral: 45.4  LV IVS:        1.60 cm  LVOT diam:     1.80 cm  LV SV:         45  LV SV Index:   23  LVOT Area:     2.54 cm     RIGHT VENTRICLE  IVC  RV S prime:     9.46 cm/s  IVC diam: 2.00 cm  TAPSE (M-mode): 0.7 cm   LEFT ATRIUM           Index        RIGHT ATRIUM           Index  LA diam:      4.40 cm 2.28 cm/m   RA Area:     18.40 cm  LA Vol (A4C): 66.2 ml 34.32 ml/m  RA Volume:   54.80 ml  28.41 ml/m   AORTIC VALVE  AV Area (VTI):     0.61 cm  AV Vmax:           326.49 cm/s  AV Vmean:          249.200 cm/s  AV VTI:            0.735 m  AV Peak Grad:      42.6 mmHg  AV Mean Grad:      27.5 mmHg  LVOT VTI:          0.175 m  LVOT/AV VTI ratio: 0.24    AORTA  Ao Root diam: 2.60 cm  Ao Asc diam:  3.10 cm   MITRAL VALVE  MV Area (PHT): 2.51 cm     SHUNTS  MV Mean grad:  5.0 mmHg     Systemic VTI:  0.18 m  MV Decel Time: 302 msec     Systemic Diam: 1.80 cm  MV E velocity: 168.00 cm/s   Cardiac cath 04/21/24:    Prox RCA-1 lesion is 90% stenosed.   Prox RCA-2 lesion is 90% stenosed.   Mid RCA lesion is 90% stenosed.   RPDA lesion is 40% stenosed.   1st Mrg lesion is 95% stenosed.   Prox LAD to Mid LAD lesion is 50% stenosed.   Mid LAD lesion is 80% stenosed.   Severe three vessel CAD Moderate calcified  stenosis throughout the proximal LAD. Severe mid LAD stenosis Severe calcified stenosis in the large caliber obtuse marginal branch Dominant RCA with severe calcified lesion in the proximal and mid vessel.  Elevated right heart pressures with wedge pressure 30-35 mmHg  Diagnostic Dominance: Right  Hemo Data  Flowsheet Row Most Recent Value  Fick Cardiac Output 5.01 L/min  Fick Cardiac Output Index 2.62 (L/min)/BSA  RA A Wave 17 mmHg  RA V Wave 17 mmHg  RA Mean 16 mmHg  RV Systolic Pressure 52 mmHg  RV Diastolic Pressure 23 mmHg  RV EDP 16 mmHg  PA Systolic Pressure 51 mmHg  PA Diastolic Pressure 35 mmHg  PA Mean 38 mmHg  PW A Wave 37 mmHg  PW V Wave 39 mmHg  PW Mean 34 mmHg  AO Systolic Pressure 113 mmHg  AO Diastolic Pressure 68 mmHg  AO Mean 87 mmHg  LV Systolic Pressure 124 mmHg  LV Diastolic Pressure 13 mmHg  LV EDP 15 mmHg  Arterial Occlusion Pressure Extended Systolic Pressure 107 mmHg  Arterial Occlusion Pressure Extended Diastolic Pressure 74 mmHg  Arterial Occlusion Pressure Extended Mean Pressure 85 mmHg  Left Ventricular Apex Extended Systolic Pressure 124 mmHg  LVp Diastolic Pressure 23 mmHg  Left Ventricular Apex Extended EDP Pressure 22 mmHg  QP/QS 1  TPVR Index 14.52 HRUI  TSVR Index 33.25 HRUI  PVR SVR Ratio 0.06  TPVR/TSVR Ratio 0.44    Cardiac CT 04/24/24: FINDINGS: Aortic Valve:   Tricuspid aortic valve with severely reduced cusp excursion. Severely thickened  and severely calcified aortic valve cusps.   AV calcium  score: 1633   Virtual Basal Annulus Measurements:   Maximum/Minimum Diameter: 25.9 x 19.2 mm   Perimeter: 70.9 mm   Area:  383 mm2   Bulky calcification below right coronary cusp external to LVOT.   Membranous septal length: 11 mm   Based on these measurements, the annulus would be suitable for a 23 mm Sapien 3 valve. Alternatively, Heart Team can consider 26 mm Evolut valve. Recommend Heart Team discussion for valve  selection.   Sinus of Valsalva Measurements:   Non-coronary:  30 mm   Right - coronary:  29 mm   Left - coronary:  31 mm   Coronary height and sinus of Valsalva Height:   Left main: 13.7 mm, Left sinus: 19.4 mm   Right coronary: 16.6 mm, Right sinus: 20.1 mm   Aorta: Conventional 3 vessel branch pattern of aortic arch.   Sinotubular Junction:  27 mm   Ascending Thoracic Aorta:  33 mm   Aortic Arch:  26 mm   Descending Thoracic Aorta:  24 mm   Coronary Arteries: Normal coronary origin. Right dominance. The study was performed without use of NTG and insufficient for plaque evaluation.   Optimum Fluoroscopic Angle for Delivery: LAO 12 CAU 9   OTHER:   Atria: Biatrial dilation   Left atrial appendage: Watchman device well seated in LAA, no evidence of peridevice leak, no device related thrombus.   Mitral valve:  moderate mitral annular calcifications.   Pulmonary artery: Mild dilation of main pulmonary artery, 31 mm.   Pulmonary veins: Normal anatomy.   IMPRESSION: 1. 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 Severely thickened and severely calcified aortic valve cusps. 2. Aortic valve calcium  score: 1633 3. Annulus area: 383 mm2, suitable for 23 mm Sapien 3 valve. No protruding LVOT calcifications, calcifications are external to annulus. Membranous septal length 11 mm. 4. Sufficient coronary artery heights from annulus. 5. Optimum fluoroscopic angle for delivery:  LAO 12 CAU 9  CT chest/abd/pelvis 04/24/24: AORTA: Moderate scattered calcified plaque throughout the thoracic aorta. Extensive calcified plaque through the abdominal aorta without an aneurysm. No dissection.   PULMONARY ARTERIES: No pulmonary embolism with the limits of this exam.   GREAT VESSELS OF AORTIC ARCH: 3 vessel brachiocephalic arterial origin anatomy without proximal stenosis. No dissection. No arterial occlusion or significant stenosis.   CELIAC TRUNK: osteal  calcified plaque without high grade stenosis. No occlusion or significant stenosis.   SUPERIOR MESENTERIC ARTERY: origin calcified plaque extending over the length of at least 3 cm resulting in at least moderate stenosis, atheromatous but patent distally.   INFERIOR MESENTERIC ARTERY: Calcified plaque at the origin which remains patent. No occlusion or significant stenosis.   RENAL ARTERIES: Single left renal artery with fairly heavily calcified ostial plaque resulting in stenosis of at least moderate severity. Duplicated right renal arteries, superior dominant with heavily calcified ostial plaque resulting in stenosis of at least moderate severity.   ILIAC ARTERIES: Scattered calcified plaque throughout bilateral common and internal iliac arteries without high grade stenosis or aneurysm. Mild tortuosity of the iliac arterial system bilaterally. Heavily calcified plaque in the right common femoral artery resulting in short segment stenosis of mild to moderate severity. Heavily calcified plaque in the proximal visualized aspect of bilateral superficial femoral arteries.   CHEST:   MEDIASTINUM: Occluder device in the left atrial appendage. Mitral annulus calcifications. Coronary leaflet calcifications. Extensive 3-vessel coronary calcifications. No mediastinal lymphadenopathy. The heart and  pericardium demonstrate no acute abnormality.   LUNGS AND PLEURA: Small left and moderate right pleural effusions, new since previous. Dependent atelectasis/consolidation posteriorly in the lung bases. No pneumothorax.   THORACIC BONES AND SOFT TISSUES: No acute bone or soft tissue abnormality.   ABDOMEN AND PELVIS:   LIVER: Mildly nodular hepatic contour suggesting cirrhosis.   GALLBLADDER AND BILE DUCTS: Gallbladder is unremarkable. No biliary ductal dilatation.   SPLEEN: The spleen is unremarkable.   PANCREAS: The pancreas is unremarkable.   ADRENAL GLANDS: Bilateral adrenal  glands demonstrate no acute abnormality.   KIDNEYS, URETERS AND BLADDER: No stones in the kidneys or ureters. No hydronephrosis. No perinephric or periureteral stranding. Urinary bladder is unremarkable.   GI AND BOWEL: Stomach and duodenal sweep demonstrate no acute abnormality. There is no bowel obstruction. No abnormal bowel wall thickening or distension.   REPRODUCTIVE: Reproductive organs are unremarkable.   PERITONEUM AND RETROPERITONEUM: No ascites or free air.   LYMPH NODES: No lymphadenopathy.   ABDOMINAL BONES AND SOFT TISSUES: multilevel spondylotic change through the lumbar spine. Mild L1 vertebral compression deformity with less than 20% loss of height, age indeterminate. Mild grade 1 anterolisthesis L4-L5 probably related to bilateral facet DJD. No acute soft tissue abnormality.   IMPRESSION: 1. SMA origin calcified plaque extending over at least 3 cm resulting in at least moderate stenosis, atheromatous but patent distally. 2. Single left renal artery with heavily calcified plaque resulting in at least moderate stenosis. 3. Duplicated right renal arteries, superior dominant, with heavily calcified plaque resulting in at least moderate stenosis. 4. Heavily calcified plaque in the right common femoral artery causing a short segment stenosis of mild to moderate severity. 5. Heavily calcified plaque in the proximal superficial femoral arteries bilaterally. Correlate with ABIs.  Recent Labs: 04/03/2024: B Natriuretic Peptide 558.5 04/19/2024: Pro Brain Natriuretic Peptide 6,770.0 04/21/2024: TSH 1.179 04/22/2024: ALT 15 04/23/2024: Magnesium  2.0 05/18/2024: BUN 20; Creatinine, Ser 0.87; Hemoglobin 9.7; Platelets 179; Potassium 4.4; Sodium 136    Wt Readings from Last 3 Encounters:  05/24/24 184 lb (83.5 kg)  05/22/24 182 lb (82.6 kg)  05/17/24 184 lb (83.5 kg)    Assessment and Plan:   1. Severe Aortic Valve Stenosis: She has severe low flow/low gradient  aortic stenosis. NYHA class 2 symptoms. I have reviewed the echo images again. The AV is thickened and calcified and does not open well. She has been seen by Dr. Daniel in CT surgery and she is not felt to be a good candidate for conventional AVR by surgical approach with bypass given heavily calcified ascending aorta. I think she will be a candidate for TAVR.   I have reviewed the natural history of aortic stenosis with the patient and their family members  who are present today. We have discussed the limitations of medical therapy and the poor prognosis associated with symptomatic aortic stenosis. We have reviewed potential treatment options, including palliative medical therapy, conventional surgical aortic valve replacement, and transcatheter aortic valve replacement. We discussed treatment options in the context of the patient's specific comorbid medical conditions.   Continue with planning for TAVR   2. 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  -Continue ASA, Plavix , Zetia  and Pravachol .   3. Atrial fibrillation, paroxysmal: Sinus today. Continue amiodarone , metoprolol . Watchman in place.   4. Cardiomyopathy/Chronic systolic CHF: Wt is stable. No volume overload on exam. Continue Lasix .   Labs/ tests ordered today include:  No orders of the defined types were placed in  this encounter.  Disposition:   F/U will be arranged with the structural team  Signed, Lonni Cash, MD, [facility] 05/24/2024 11:02 AM    [facility] [address] [place], [place], [place]  [address] Phone: [phone]; Fax: [phone]      "

## 2024-05-24 ENCOUNTER — Ambulatory Visit: Attending: Cardiovascular Disease | Admitting: Cardiovascular Disease

## 2024-05-24 ENCOUNTER — Encounter: Payer: Self-pay | Admitting: Cardiovascular Disease

## 2024-05-24 VITALS — BP 130/82 | HR 58 | Ht 62.5 in | Wt 184.0 lb

## 2024-05-24 DIAGNOSIS — I2510 Atherosclerotic heart disease of native coronary artery without angina pectoris: Secondary | ICD-10-CM

## 2024-05-24 DIAGNOSIS — I350 Nonrheumatic aortic (valve) stenosis: Secondary | ICD-10-CM

## 2024-05-24 DIAGNOSIS — I480 Paroxysmal atrial fibrillation: Secondary | ICD-10-CM | POA: Diagnosis not present

## 2024-05-24 NOTE — Patient Instructions (Signed)
 Medication Instructions:  Your physician recommends that you continue on your current medications as directed. Please refer to the Current Medication list given to you today.  *If you need a refill on your cardiac medications before your next appointment, please call your pharmacy*  Lab Work: none If you have labs (blood work) drawn today and your tests are completely normal, you will receive your results only by: MyChart Message (if you have MyChart) OR A paper copy in the mail If you have any lab test that is abnormal or we need to change your treatment, we will call you to review the results.  Testing/Procedures: none  Follow-Up: At [facility], you and your health needs are our priority.  As part of our continuing mission to provide you with exceptional heart care, our providers are all part of one team.  This team includes your primary Cardiologist (physician) and Advanced Practice Providers or APPs (Physician Assistants and Nurse Practitioners) who all work together to provide you with the care you need, when you need it.  Your next appointment:   To be determined  Provider:   Lonni Cash, MD or Izetta Hummer, PA-C    We recommend signing up for the patient portal called MyChart.  Sign up information is provided on this After Visit Summary.  MyChart is used to connect with patients for Virtual Visits (Telemedicine).  Patients are able to view lab/test results, encounter notes, upcoming appointments, etc.  Non-urgent messages can be sent to your provider as well.   To learn more about what you can do with MyChart, go to [email or web address].   Other Instructions Lauren will contact you to arrange next appointments

## 2024-05-30 ENCOUNTER — Encounter: Payer: Self-pay | Admitting: Family Medicine

## 2024-05-30 ENCOUNTER — Other Ambulatory Visit: Payer: Self-pay | Admitting: *Deleted

## 2024-05-30 ENCOUNTER — Ambulatory Visit (INDEPENDENT_AMBULATORY_CARE_PROVIDER_SITE_OTHER): Payer: Medicare Other | Admitting: Family Medicine

## 2024-05-30 VITALS — BP 112/64 | HR 66 | Temp 97.1°F | Ht 62.5 in | Wt 186.1 lb

## 2024-05-30 DIAGNOSIS — E034 Atrophy of thyroid (acquired): Secondary | ICD-10-CM | POA: Diagnosis not present

## 2024-05-30 DIAGNOSIS — Z7985 Long-term (current) use of injectable non-insulin antidiabetic drugs: Secondary | ICD-10-CM | POA: Diagnosis not present

## 2024-05-30 DIAGNOSIS — Z23 Encounter for immunization: Secondary | ICD-10-CM | POA: Diagnosis not present

## 2024-05-30 DIAGNOSIS — E1169 Type 2 diabetes mellitus with other specified complication: Secondary | ICD-10-CM

## 2024-05-30 DIAGNOSIS — I152 Hypertension secondary to endocrine disorders: Secondary | ICD-10-CM | POA: Diagnosis not present

## 2024-05-30 DIAGNOSIS — Z0001 Encounter for general adult medical examination with abnormal findings: Secondary | ICD-10-CM | POA: Diagnosis not present

## 2024-05-30 DIAGNOSIS — D509 Iron deficiency anemia, unspecified: Secondary | ICD-10-CM

## 2024-05-30 DIAGNOSIS — G2581 Restless legs syndrome: Secondary | ICD-10-CM

## 2024-05-30 DIAGNOSIS — L4050 Arthropathic psoriasis, unspecified: Secondary | ICD-10-CM | POA: Diagnosis not present

## 2024-05-30 DIAGNOSIS — E538 Deficiency of other specified B group vitamins: Secondary | ICD-10-CM

## 2024-05-30 DIAGNOSIS — M5416 Radiculopathy, lumbar region: Secondary | ICD-10-CM

## 2024-05-30 DIAGNOSIS — E1159 Type 2 diabetes mellitus with other circulatory complications: Secondary | ICD-10-CM

## 2024-05-30 DIAGNOSIS — Z0000 Encounter for general adult medical examination without abnormal findings: Secondary | ICD-10-CM

## 2024-05-30 DIAGNOSIS — Z780 Asymptomatic menopausal state: Secondary | ICD-10-CM

## 2024-05-30 MED ORDER — LOSARTAN POTASSIUM 25 MG PO TABS: 25.0000 mg | ORAL_TABLET | Freq: Every day | ORAL | 3 refills | Status: AC

## 2024-05-30 MED ORDER — SEMAGLUTIDE(0.25 OR 0.5MG/DOS) 2 MG/3ML ~~LOC~~ SOPN: 0.5000 mg | PEN_INJECTOR | SUBCUTANEOUS | 4 refills | Status: AC

## 2024-05-30 MED ORDER — ROPINIROLE HCL 0.5 MG PO TABS: 0.5000 mg | ORAL_TABLET | Freq: Every day | ORAL | 3 refills | Status: AC

## 2024-05-30 MED ORDER — GABAPENTIN 300 MG PO CAPS: 300.0000 mg | ORAL_CAPSULE | Freq: Two times a day (BID) | ORAL | 3 refills | Status: AC

## 2024-05-30 NOTE — Patient Outreach (Signed)
 " Transition of Care week 2  Visit Note  05/30/2024  Name: Emily [name] MRN: [id number]          DOB: 10-29-1950  Situation: Patient enrolled in [facility] 30-day program. Visit completed with patient by telephone.   Background:  Discharge Date and Diagnosis: coronary artery disease   Past Medical History:  Diagnosis Date   Anemia    Aortic stenosis    Arthritis    Bilateral carotid artery disease    CAD (coronary artery disease)    Complication of anesthesia    difficult intubation in past   Diabetes mellitus without complication (HCC)    type 2   Difficult intubation    states 'lady that did the sleep study told me I have the smallest airway she has ever seen in an adult   Dyslipidemia 06/29/2017   HFrEF (heart failure with reduced ejection fraction) (HCC)    History of blood transfusion 09/2019   GI bleed - in [place]   Hypertension    Hypothyroid    Obesity    Obstructive sleep apnea    occasional uses CPAP   Peripheral vascular disease    Persistent atrial fibrillation (HCC) 01/16/2020   Syncope and collapse 05/19/2024   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 - xrays were negative   Upper GI bleed 09/25/2019    Assessment: Patient Reported Symptoms: Cognitive Cognitive Status: No symptoms reported, Able to follow simple commands, Alert and oriented to person, place, and time, Normal speech and language skills      Neurological Neurological Review of Symptoms: No symptoms reported    HEENT HEENT Symptoms Reported: No symptoms reported      Cardiovascular Cardiovascular Symptoms Reported: No symptoms reported Does patient have uncontrolled Hypertension?: No Weight: 181 lb (82.1 kg) Cardiovascular Self-Management Outcome: 4 (good) Cardiovascular Comment: pt is checking HR, BP and weight daily, keeping a log  Respiratory Respiratory Symptoms Reported: No symptoms reported    Endocrine Endocrine Symptoms Reported: No symptoms reported Is  patient diabetic?: Yes Is patient checking blood sugars at home?: Yes List most recent blood sugar readings, include date and time of day: FBS 150 today Endocrine Self-Management Outcome: 4 (good) Endocrine Comment: reinforced carbohydrate modified diet  Gastrointestinal Gastrointestinal Symptoms Reported: No symptoms reported      Genitourinary Genitourinary Symptoms Reported: No symptoms reported    Integumentary Integumentary Symptoms Reported: No symptoms reported    Musculoskeletal Musculoskelatal Symptoms Reviewed: No symptoms reported        Psychosocial Psychosocial Symptoms Reported: No symptoms reported         Today's Vitals   05/30/24 1334  Weight: 181 lb (82.1 kg)   Pain Scale: 0-10 Pain Score: 0-No pain  Medications Reviewed Today     Reviewed by Aura Mliss LABOR, RN (Registered Nurse) on 05/30/24 at 1347  Med List Status: <None>   Medication Order Taking? Sig Documenting Provider Last Dose Status Informant  amiodarone  (PACERONE ) 200 MG tablet [id number]  Take 1 tablet (200 mg total) by mouth daily. Goodrich, Callie E, PA-C  Active   aspirin  EC 81 MG tablet [id number]  Take 1 tablet (81 mg total) by mouth daily. Swallow whole. Verlin Lonni BIRCH, MD  Active Self, Pharmacy Records  clopidogrel  (PLAVIX ) 75 MG tablet [id number]  TAKE 1 TABLET BY MOUTH EVERY DAY Bethanie Cough, PA-C  Active Self, Pharmacy Records  cyanocobalamin  ((VITAMIN B-12)) injection 1,000 mcg [id number]   Harl Jayson CROME, MD  Active  diphenhydramine -acetaminophen  (TYLENOL  PM) 25-500 MG TABS tablet [id number]  Take 1 tablet by mouth at bedtime as needed (pain). [provider]  Active Self, Pharmacy Records  ezetimibe  (ZETIA ) 10 MG tablet [id number]  TAKE 1 TABLET BY MOUTH EVERY DAY Lavona Agent, MD  Active Self, Pharmacy Records  furosemide  (LASIX ) 40 MG tablet [id number]  Take 1 tablet (40 mg total) by mouth daily. Gherghe, Costin M, MD  Active Self, Pharmacy Records  gabapentin   (NEURONTIN ) 300 MG capsule [id number]  Take 1 capsule (300 mg total) by mouth 2 (two) times daily. Jolinda Potter M, DO  Active   levonorgestrel (MIRENA) 20 MCG/DAY IUD [id number]  1 each by Intrauterine route once. [provider]  Active Self, Pharmacy Records  levothyroxine  (SYNTHROID ) 112 MCG tablet [id number]  TAKE 1 TABLET BY MOUTH EVERY OTHER DAY, ALTERNATING WITH 1 & 1/2 TABLETS EVERY OTHER DAY  Patient taking differently: Take 112 mcg by mouth See admin instructions. TAKE 1 TABLET BY MOUTH EVERY OTHER DAY, ALTERNATING WITH 1 & 1/2 TABLETS EVERY OTHER DAY   Gottschalk, Ashly M, DO  Active Self, Pharmacy Records           Med Note [facility], [facility]   Fri May 12, 2024  4:02 PM)    losartan  (COZAAR ) 25 MG tablet [id number]  Take 1 tablet (25 mg total) by mouth daily. Jolinda Potter M, DO  Active   metFORMIN  (GLUCOPHAGE ) 1000 MG tablet [id number]  Take 1 tablet (1,000 mg total) by mouth 2 (two) times daily with a meal.  Patient taking differently: Take 500 mg by mouth 2 (two) times daily with a meal. 500 mg BID   Jolinda Potter M, DO  Active Self, Pharmacy Records           Med Note JACKOLYN WADDELL DEL   Tue Apr 04, 2024  8:43 AM)    metoprolol  succinate (TOPROL -XL) 25 MG 24 hr tablet [id number]  Take 1 tablet (25 mg total) by mouth at bedtime. Goodrich, Callie E, PA-C  Active   polyethylene glycol (MIRALAX  / GLYCOLAX ) 17 g packet [id number]  Take 17 g by mouth daily as needed for moderate constipation. [provider]  Active Self, Pharmacy Records  rOPINIRole  (REQUIP ) 0.5 MG tablet [id number]  Take 1 tablet (0.5 mg total) by mouth at bedtime. Jolinda Potter M, DO  Active   rosuvastatin  (CRESTOR ) 20 MG tablet [id number]  Take 1 tablet (20 mg total) by mouth daily. Goodrich, Callie E, PA-C  Active   Semaglutide ,0.25 or 0.5MG /DOS, 2 MG/3ML SOPN [id number]  Inject 0.5 mg into the skin once a week. Jolinda Potter M, DO  Active   spironolactone  (ALDACTONE ) 25 MG tablet [id number]   Take 1 tablet (25 mg total) by mouth daily. Trixie Nilda HERO, MD  Active Self, Pharmacy Records            Goals Addressed             This Visit's Progress    VBCI Transitions of Care (TOC) Care Plan       Problems:  Recent Hospitalization for treatment of CAD and  coronary stent intervention No Hospital Follow Up Provider appointment pt does not have post hospital follow up appointment Pt reports she is weighing, checking heart rate and blood pressure daily and keeping a log, pt saw cardiologist 05/24/24 and will be following up regarding planning for TAVR  Goal:  Over the next 30 days, the patient will not experience hospital readmission  Interventions: CAD Interventions: Counseled on  increased risk of stroke due to Afib and benefits of anticoagulation for stroke prevention Reviewed importance of adherence to anticoagulant exactly as prescribed Counseled on importance of regular laboratory monitoring as prescribed Reviewed carbohydrate modified diet Reinforced signs /symptoms MI, seeking emergency medical help immediately Encouraged pt to continue daily weights, BP, HR and keep a log, take log to all provider appointments Reviewed medications, importance of taking as prescribed Reviewed all upcoming scheduled appointments   Patient Self Care Activities:  Attend all scheduled provider appointments Call pharmacy for medication refills 3-7 days in advance of running out of medications Call provider office for new concerns or questions  Notify RN Care Manager of TOC call rescheduling needs Participate in Transition of Care Program/Attend TOC scheduled calls Perform all self care activities independently  Perform IADL's (shopping, preparing meals, housekeeping, managing finances) independently Take medications as prescribed   begin a symptom diary bring symptom diary to all appointments check pulse (heart) rate before taking medicine make a plan to eat healthy keep all  lab appointments take medicine as prescribed wear medical alert identification Continue to weigh daily, check blood pressure, heart rate and keep a log, take log to all provider appointments  Plan:  Telephone outreach 06/06/24 @ 115 pm         Recommendation:   PCP Follow-up  Follow Up Plan:   Telephone follow-up 06/07/23 @ 115 pm  Mliss Creed [facility], BSN RN Care Manager/ Transition of Care [place]/ [facility] [phone]     "

## 2024-05-30 NOTE — Patient Instructions (Signed)
 Visit Information  Thank you for taking time to visit with me today. Please don't hesitate to contact me if I can be of assistance to you before our next scheduled telephone appointment.  Our next appointment is by telephone on 06/06/24 @ 115 pm  Following is a copy of your care plan:   Goals Addressed             This Visit's Progress    VBCI Transitions of Care (TOC) Care Plan       Problems:  Recent Hospitalization for treatment of CAD and  coronary stent intervention No Hospital Follow Up Provider appointment pt does not have post hospital follow up appointment Pt reports she is weighing, checking heart rate and blood pressure daily and keeping a log, pt saw cardiologist 05/24/24 and will be following up regarding planning for TAVR  Goal:  Over the next 30 days, the patient will not experience hospital readmission  Interventions: CAD Interventions: Counseled on increased risk of stroke due to Afib and benefits of anticoagulation for stroke prevention Reviewed importance of adherence to anticoagulant exactly as prescribed Counseled on importance of regular laboratory monitoring as prescribed Reviewed carbohydrate modified diet Reinforced signs /symptoms MI, seeking emergency medical help immediately Encouraged pt to continue daily weights, BP, HR and keep a log, take log to all provider appointments Reviewed medications, importance of taking as prescribed Reviewed all upcoming scheduled appointments   Patient Self Care Activities:  Attend all scheduled provider appointments Call pharmacy for medication refills 3-7 days in advance of running out of medications Call provider office for new concerns or questions  Notify RN Care Manager of TOC call rescheduling needs Participate in Transition of Care Program/Attend TOC scheduled calls Perform all self care activities independently  Perform IADL's (shopping, preparing meals, housekeeping, managing finances) independently Take  medications as prescribed   begin a symptom diary bring symptom diary to all appointments check pulse (heart) rate before taking medicine make a plan to eat healthy keep all lab appointments take medicine as prescribed wear medical alert identification Continue to weigh daily, check blood pressure, heart rate and keep a log, take log to all provider appointments  Plan:  Telephone outreach 06/06/24 @ 115 pm        Care plan and visit instructions communicated with the patient verbally today. The patient DECLINED to receive copy of care plan and patient instructions in any format.   Telephone follow up appointment with care management team member scheduled for:  06/06/24 @ 115 pm  Please call the care guide team at [phone] if you need to cancel or reschedule your appointment.   Please call the Suicide and Crisis Lifeline: 988 call the [place]  National Suicide Prevention Lifeline: 680-267-3988 or TTY: 414-230-9175 TTY 340-850-1050) to talk to a trained counselor call 1-800-273-TALK (toll free, 24 hour hotline) go to [facility] [address], [place] [phone]) call the [facility] Crisis Line: 360-565-7255 call 911 if you are experiencing a Mental Health or Behavioral Health Crisis or need someone to talk to.  Mliss Creed [facility], BSN RN Care Manager/ Transition of Care [place]/ [facility] [phone]

## 2024-05-30 NOTE — Progress Notes (Signed)
 "  Emily [name] is a 73 y.o. female presents to office today for annual physical exam examination.     Type 2 Diabetes with hypertension, hyperlipidemia:  Patient is compliant with medications.  Reports no hypoglycemic episodes.  Last eye exam: UTD Last foot exam: needs Last A1c:  Lab Results  Component Value Date   HGBA1C 5.5 04/04/2024   Nephropathy screen indicated?: UTD Last flu, zoster and/or pneumovax:  Immunization History  Administered Date(s) Administered   Fluad Quad(high Dose 65+) 03/24/2016, 02/28/2019, 03/05/2020, 06/09/2022   Fluad Trivalent(High Dose 65+) 05/18/2023   INFLUENZA, HIGH DOSE SEASONAL PF 03/24/2016, 03/22/2018, 04/18/2024   Influenza, Seasonal, Injecte, Preservative Fre 06/05/2014, 07/02/2015   Influenza,inj,Quad PF,6+ Mos 06/05/2014, 07/02/2015   Influenza-Unspecified 03/24/2016   PFIZER(Purple Top)SARS-COV-2 Vaccination 07/10/2019, 08/03/2019, 03/13/2020   Pneumococcal Conjugate-13 07/09/2015   Pneumococcal Polysaccharide-23 01/30/2014, 02/28/2019   Tdap 05/26/2014    ROS: No reports of chest pain or shortness of breath.  She is undergoing multiple cardiac interventions for valvular disorder contributing to A-fib. S/p drug-eluting cardiac stent.  Energy is fair.  Now on Pacerone .  Compliant with thyroid  meds.   Occupation: not working right now due to heart issues.  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, Substance use: None Health Maintenance Due  Topic Date Due   Zoster Vaccines- Shingrix (1 of 2) Never done   Medicare Annual Wellness (AWV)  12/22/2018   FOOT EXAM  03/01/2024   DTaP/Tdap/Td (2 - Td or Tdap) 05/26/2024   Colonoscopy  07/03/2024    Immunization History  Administered Date(s) Administered   Fluad Quad(high Dose 65+) 03/24/2016, 02/28/2019, 03/05/2020, 06/09/2022   Fluad Trivalent(High Dose 65+) 05/18/2023   INFLUENZA, HIGH DOSE SEASONAL PF 03/24/2016, 03/22/2018, 04/18/2024   Influenza, Seasonal, Injecte, Preservative Fre  06/05/2014, 07/02/2015   Influenza,inj,Quad PF,6+ Mos 06/05/2014, 07/02/2015   Influenza-Unspecified 03/24/2016   PFIZER(Purple Top)SARS-COV-2 Vaccination 07/10/2019, 08/03/2019, 03/13/2020   Pneumococcal Conjugate-13 07/09/2015   Pneumococcal Polysaccharide-23 01/30/2014, 02/28/2019   Tdap 05/26/2014   Past Medical History:  Diagnosis Date   Anemia    Aortic stenosis    Arthritis    Bilateral carotid artery disease    CAD (coronary artery disease)    Complication of anesthesia    difficult intubation in past   Diabetes mellitus without complication (HCC)    type 2   Difficult intubation    states 'lady that did the sleep study told me I have the smallest airway she has ever seen in an adult   Dyslipidemia 06/29/2017   HFrEF (heart failure with reduced ejection fraction) (HCC)    History of blood transfusion 09/2019   GI bleed - in [place]   Hypertension    Hypothyroid    Obesity    Obstructive sleep apnea    occasional uses CPAP   Peripheral vascular disease    Persistent atrial fibrillation (HCC) 01/16/2020   Syncope and collapse 05/19/2024   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 - xrays were negative   Upper GI bleed 09/25/2019   Social History   Socioeconomic History   Marital status: Single    Spouse name: Not on file   Number of children: Not on file   Years of education: Not on file   Highest education level: Not on file  Occupational History   Not on file  Tobacco Use   Smoking status: Never    Passive exposure: Never   Smokeless tobacco: Never  Vaping Use   Vaping status:  Never Used  Substance and Sexual Activity   Alcohol use: No    Alcohol/week: 0.0 standard drinks of alcohol   Drug use: Never   Sexual activity: Not Currently    Birth control/protection: Post-menopausal  Other Topics Concern   Not on file  Social History Narrative   Not on file   Social Drivers of Health   Tobacco Use: Low Risk (05/24/2024)   Patient  History    Smoking Tobacco Use: Never    Smokeless Tobacco Use: Never    Passive Exposure: Never  Financial Resource Strain: Low Risk (04/21/2022)   Overall Financial Resource Strain (CARDIA)    Difficulty of Paying Living Expenses: Not hard at all  Food Insecurity: No Food Insecurity (05/22/2024)   Epic    Worried About Radiation Protection Practitioner of Food in the Last Year: Never true    Ran Out of Food in the Last Year: Never true  Transportation Needs: No Transportation Needs (05/22/2024)   Epic    Lack of Transportation (Medical): No    Lack of Transportation (Non-Medical): No  Physical Activity: Inactive (04/21/2022)   Exercise Vital Sign    Days of Exercise per Week: 0 days    Minutes of Exercise per Session: 0 min  Stress: No Stress Concern Present (04/21/2022)   [employer] of Occupational Health - Occupational Stress Questionnaire    Feeling of Stress : Not at all  Social Connections: Moderately Integrated (05/17/2024)   Social Connection and Isolation Panel    Frequency of Communication with Friends and Family: More than three times a week    Frequency of Social Gatherings with Friends and Family: More than three times a week    Attends Religious Services: More than 4 times per year    Active Member of [employer] or Organizations: Yes    Attends Banker Meetings: More than 4 times per year    Marital Status: Never married  Intimate Partner Violence: Not At Risk (05/22/2024)   Epic    Fear of Current or Ex-Partner: No    Emotionally Abused: No    Physically Abused: No    Sexually Abused: No  Depression (PHQ2-9): Low Risk (05/22/2024)   Depression (PHQ2-9)    PHQ-2 Score: 0  Alcohol Screen: Not on file  Housing: Unknown (05/22/2024)   Epic    Unable to Pay for Housing in the Last Year: No    Number of Times Moved in the Last Year: Not on file    Homeless in the Last Year: No  Utilities: Not At Risk (05/22/2024)   Epic    Threatened with loss of utilities: No   Health Literacy: Not on file   Past Surgical History:  Procedure Laterality Date   CATARACT EXTRACTION Left    CATARACT EXTRACTION W/PHACO Right 12/09/2015   Procedure: CATARACT EXTRACTION PHACO AND INTRAOCULAR LENS PLACEMENT (IOC);  Surgeon: Cherene Mania, MD;  Location: [facility];  Service: Ophthalmology;  Laterality: Right;  CDE:10.48   COLONOSCOPY W/ POLYPECTOMY     CORONARY ATHERECTOMY N/A 05/17/2024   Procedure: CORONARY ATHERECTOMY;  Surgeon: Verlin Lonni BIRCH, MD;  Location: [facility];  Service: Cardiovascular;  Laterality: N/A;   CORONARY LITHOTRIPSY N/A 05/17/2024   Procedure: CORONARY LITHOTRIPSY;  Surgeon: Verlin Lonni BIRCH, MD;  Location: [facility];  Service: Cardiovascular;  Laterality: N/A;   CORONARY STENT INTERVENTION N/A 05/17/2024   Procedure: CORONARY STENT INTERVENTION;  Surgeon: Verlin Lonni BIRCH, MD;  Location: [facility];  Service: Cardiovascular;  Laterality: N/A;   cyst on back of neck removed     DILATION AND CURETTAGE OF UTERUS     x 5   ENDARTERECTOMY Left 08/30/2015   Procedure: LEFT CAROYID ENDARTERECTOMY WITH XENOSURE BOVINE PERICARDIUM PATCH ANGIOPLASTY;  Surgeon: Gaile LELON New, MD;  Location: [facility];  Service: Vascular;  Laterality: Left;   RIGHT/LEFT HEART CATH AND CORONARY ANGIOGRAPHY N/A 04/21/2024   Procedure: RIGHT/LEFT HEART CATH AND CORONARY ANGIOGRAPHY;  Surgeon: Verlin Lonni BIRCH, MD;  Location: [facility];  Service: Cardiovascular;  Laterality: N/A;   TONSILLECTOMY     TRANSCAROTID ARTERY REVASCULARIZATION  Right 03/19/2023   Procedure: Right Transcarotid Artery Revascularization;  Surgeon: New Gaile LELON, MD;  Location: [facility];  Service: Vascular;  Laterality: Right;   ULTRASOUND GUIDANCE FOR VASCULAR ACCESS Left 03/19/2023   Procedure: ULTRASOUND GUIDANCE FOR VASCULAR ACCESS;  Surgeon: New Gaile LELON, MD;  Location: [facility];  Service: Vascular;  Laterality: Left;   UPPER GI ENDOSCOPY      x several   Family History  Problem Relation Age of Onset   COPD Father    Heart failure Father    Heart disease Father    Emphysema Father    Arrhythmia Sister    Arrhythmia Sister    Arrhythmia Sister        had PPM also   Cancer Sister    Current Medications[1]  Allergies[2]   ROS: Review of Systems Pertinent items noted in HPI and remainder of comprehensive ROS otherwise negative.    Physical exam BP 112/64   Pulse 66   Temp (!) 97.1 F (36.2 C)   Ht 5' 2.5 (1.588 m)   Wt 186 lb 2 oz (84.4 kg)   SpO2 98%   BMI 33.50 kg/m  General appearance: alert, cooperative, appears stated age, no distress, and mildly obese Head: Normocephalic, without obvious abnormality, atraumatic Eyes: negative findings: lids and lashes normal, conjunctivae and sclerae normal, corneas clear, and pupils equal, round, reactive to light and accomodation Ears: normal TM's and external ear canals both ears Nose: Nares normal. Septum midline. Mucosa normal. No drainage or sinus tenderness. Throat: lips, mucosa, and tongue normal; teeth and gums normal Neck: no adenopathy, no carotid bruit, supple, symmetrical, trachea midline, and thyroid  not enlarged, symmetric, no tenderness/mass/nodules Back: Increased kyphosis of thoracic spine.  Ambulates independently Lungs: clear to auscultation bilaterally Heart: Regular rate and rhythm.  Murmur appreciated. Abdomen: Obese, soft, nontender Extremities: extremities normal, atraumatic, no cyanosis or edema Pulses: +1 pedal pulses Skin: Skin color, texture, turgor normal. No rashes or lesions Lymph nodes: Supraclavicular and anterior cervical lymph nodes without enlargement Neurologic: Alert and oriented X 3, normal strength and tone. Normal symmetric reflexes. Normal coordination and gait      05/30/2024    9:48 AM 05/22/2024   11:54 AM 04/26/2024   10:59 AM  Depression screen PHQ 2/9  Decreased Interest 0 0 0  Down, Depressed, Hopeless 0 0 0  PHQ  - 2 Score 0 0 0  Altered sleeping 0    Tired, decreased energy 0    Change in appetite 0    Feeling bad or failure about yourself  0    Trouble concentrating 0    Moving slowly or fidgety/restless 0    Suicidal thoughts 0    PHQ-9 Score 0    Difficult doing work/chores Not difficult at all        05/30/2024    9:49 AM 05/22/2024  11:54 AM 04/26/2024   10:59 AM 04/12/2024   10:09 AM  GAD 7 : Generalized Anxiety Score  Nervous, Anxious, on Edge 0 0 1 1  Control/stop worrying 0 0 0 0  Worry too much - different things 0 0 0 0  Trouble relaxing 0 0 0 0  Restless 0 0 0 0  Easily annoyed or irritable 0 0 0 0  Afraid - awful might happen 0 0 0 0  Total GAD 7 Score 0 0 1 1  Anxiety Difficulty Not difficult at all Not difficult at all Not difficult at all Somewhat difficult    Diabetic Foot Exam - Simple   Simple Foot Form Diabetic Foot exam was performed with the following findings: Yes 05/30/2024 12:00 PM  Visual Inspection See comments: Yes Sensation Testing See comments: Yes Pulse Check See comments: Yes Comments +1 pedal pulses bilaterally.  Onychomycotic changes to the nails bilaterally.  She has decreased sensation in the left foot compared to the right but monofilament sensation is present just duller on the left       Assessment/ Plan: Emily [name] here for annual physical exam.   Annual physical exam  Hypothyroidism due to acquired atrophy of thyroid  - Plan: CMP14+EGFR, TSH + free T4  Diabetes mellitus treated with injections of non-insulin  medication (HCC) - Plan: CBC with Differential, CMP14+EGFR, Semaglutide ,0.25 or 0.5MG /DOS, 2 MG/3ML SOPN  Hypertension associated with diabetes (HCC) - Plan: CMP14+EGFR, losartan  (COZAAR ) 25 MG tablet  Hyperlipidemia associated with type 2 diabetes mellitus (HCC) - Plan: CMP14+EGFR  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HCC) - Plan: CBC with Differential, CMP14+EGFR  Iron  deficiency anemia, unspecified iron  deficiency  anemia type - Plan: Anemia panel, CBC with Differential  Vitamin B12 deficiency - Plan: Anemia panel, CBC with Differential, CMP14+EGFR  Asymptomatic postmenopausal estrogen deficiency - Plan: CMP14+EGFR, DG [facility] DEXA  Lumbar radiculopathy, right - Plan: gabapentin  (NEURONTIN ) 300 MG capsule  Restless leg syndrome - Plan: rOPINIRole  (REQUIP ) 0.5 MG tablet  Immunization due - Plan: Tdap vaccine greater than or equal to 7yo IM   Clinically euthyroid.  Check thyroid  levels given use of amiodarone  now  Check A1c, fasting labs.  I have renewed her medications.  Blood pressure is controlled.  Psoriasis is not currently active.  Check anemia panel given drop in hemoglobin though I suspect this is likely postprocedural  Will check vitamin B-12 as well given history of deficiency  DEXA scan ordered   Gabapentin  renewed as this is helping with the radicular symptoms in the right thigh  Requip  renewed.  RLS stable  Tetanus administered today  Counseled on healthy lifestyle choices, including diet (rich in fruits, vegetables and lean meats and low in salt and simple carbohydrates) and exercise (at least 30 minutes of moderate physical activity daily).  Patient to follow up 57m for DM/ thyroid . Heart followup  Beldon Nowling M. Nataniel Gasper, DO        [1]  Current Outpatient Medications:    amiodarone  (PACERONE ) 200 MG tablet, Take 1 tablet (200 mg total) by mouth daily., Disp: , Rfl:    aspirin  EC 81 MG tablet, Take 1 tablet (81 mg total) by mouth daily. Swallow whole., Disp: , Rfl:    clopidogrel  (PLAVIX ) 75 MG tablet, TAKE 1 TABLET BY MOUTH EVERY DAY, Disp: 90 tablet, Rfl: 2   diphenhydramine -acetaminophen  (TYLENOL  PM) 25-500 MG TABS tablet, Take 1 tablet by mouth at bedtime as needed (pain)., Disp: , Rfl:    ezetimibe  (ZETIA ) 10 MG tablet, TAKE 1 TABLET BY  MOUTH EVERY DAY, Disp: 90 tablet, Rfl: 2   furosemide  (LASIX ) 40 MG tablet, Take 1 tablet (40 mg total) by mouth daily., Disp: 90  tablet, Rfl: 0   gabapentin  (NEURONTIN ) 300 MG capsule, Take 1 capsule (300 mg total) by mouth 2 (two) times daily., Disp: , Rfl:    levonorgestrel (MIRENA) 20 MCG/DAY IUD, 1 each by Intrauterine route once., Disp: , Rfl:    levothyroxine  (SYNTHROID ) 112 MCG tablet, TAKE 1 TABLET BY MOUTH EVERY OTHER DAY, ALTERNATING WITH 1 & 1/2 TABLETS EVERY OTHER DAY (Patient taking differently: Take 112 mcg by mouth See admin instructions. TAKE 1 TABLET BY MOUTH EVERY OTHER DAY, ALTERNATING WITH 1 & 1/2 TABLETS EVERY OTHER DAY), Disp: 112 tablet, Rfl: 3   losartan  (COZAAR ) 25 MG tablet, Take 1 tablet (25 mg total) by mouth daily., Disp: 90 tablet, Rfl: 2   metFORMIN  (GLUCOPHAGE ) 1000 MG tablet, Take 1 tablet (1,000 mg total) by mouth 2 (two) times daily with a meal. (Patient taking differently: Take 500 mg by mouth 2 (two) times daily with a meal. 500 mg BID), Disp: 180 tablet, Rfl: 3   metoprolol  succinate (TOPROL -XL) 25 MG 24 hr tablet, Take 1 tablet (25 mg total) by mouth at bedtime., Disp: 30 tablet, Rfl: 2   polyethylene glycol (MIRALAX  / GLYCOLAX ) 17 g packet, Take 17 g by mouth daily as needed for moderate constipation., Disp: , Rfl:    rOPINIRole  (REQUIP ) 0.5 MG tablet, TAKE 1 TABLET BY MOUTH AT BEDTIME., Disp: 90 tablet, Rfl: 0   rosuvastatin  (CRESTOR ) 20 MG tablet, Take 1 tablet (20 mg total) by mouth daily., Disp: 30 tablet, Rfl: 2   Semaglutide ,0.25 or 0.5MG /DOS, 2 MG/3ML SOPN, Inject 0.5 mg into the skin once a week., Disp: 3 mL, Rfl:    spironolactone  (ALDACTONE ) 25 MG tablet, Take 1 tablet (25 mg total) by mouth daily., Disp: 90 tablet, Rfl: 0  Current Facility-Administered Medications:    cyanocobalamin  ((VITAMIN B-12)) injection 1,000 mcg, 1,000 mcg, Intramuscular, Q30 days, Harl Jayson CROME, MD, 1,000 mcg at 05/05/24 1140 [2]  Allergies Allergen Reactions   Statins Other (See Comments) and Cough    Not all statins but some cause cough and pain in legs.   Jardiance  [Empagliflozin ] Other  (See Comments)    Recurrent vaginitis   Quinapril Hcl Cough   Tape Other (See Comments)    Redness, please use paper tape   "

## 2024-05-31 ENCOUNTER — Ambulatory Visit: Payer: Self-pay | Admitting: Family Medicine

## 2024-05-31 LAB — CBC WITH DIFFERENTIAL/PLATELET
Basophils Absolute: 0.1 x10E3/uL (ref 0.0–0.2)
Basos: 1 %
EOS (ABSOLUTE): 0.4 x10E3/uL (ref 0.0–0.4)
Eos: 5 %
Hemoglobin: 10.1 g/dL — ABNORMAL LOW (ref 11.1–15.9)
Immature Grans (Abs): 0.1 x10E3/uL (ref 0.0–0.1)
Immature Granulocytes: 1 %
Lymphocytes Absolute: 0.6 x10E3/uL — ABNORMAL LOW (ref 0.7–3.1)
Lymphs: 7 %
MCH: 30.4 pg (ref 26.6–33.0)
MCHC: 31.9 g/dL (ref 31.5–35.7)
MCV: 96 fL (ref 79–97)
Monocytes Absolute: 0.5 x10E3/uL (ref 0.1–0.9)
Monocytes: 6 %
Neutrophils Absolute: 6.9 x10E3/uL (ref 1.4–7.0)
Neutrophils: 80 %
Platelets: 254 x10E3/uL (ref 150–450)
RBC: 3.32 x10E6/uL — ABNORMAL LOW (ref 3.77–5.28)
RDW: 16.6 % — ABNORMAL HIGH (ref 11.7–15.4)
WBC: 8.5 x10E3/uL (ref 3.4–10.8)

## 2024-05-31 LAB — CMP14+EGFR
ALT: 50 IU/L — ABNORMAL HIGH (ref 0–32)
AST: 44 IU/L — ABNORMAL HIGH (ref 0–40)
Albumin: 4.4 g/dL (ref 3.8–4.8)
Alkaline Phosphatase: 158 IU/L — ABNORMAL HIGH (ref 49–135)
BUN/Creatinine Ratio: 28 (ref 12–28)
BUN: 33 mg/dL — ABNORMAL HIGH (ref 8–27)
Bilirubin Total: 1 mg/dL (ref 0.0–1.2)
CO2: 24 mmol/L (ref 20–29)
Calcium: 9.5 mg/dL (ref 8.7–10.3)
Chloride: 96 mmol/L (ref 96–106)
Creatinine, Ser: 1.18 mg/dL — ABNORMAL HIGH (ref 0.57–1.00)
Globulin, Total: 2.2 g/dL (ref 1.5–4.5)
Glucose: 148 mg/dL — ABNORMAL HIGH (ref 70–99)
Potassium: 5 mmol/L (ref 3.5–5.2)
Sodium: 136 mmol/L (ref 134–144)
Total Protein: 6.6 g/dL (ref 6.0–8.5)
eGFR: 48 mL/min/1.73 — ABNORMAL LOW

## 2024-05-31 LAB — ANEMIA PANEL
Ferritin: 256 ng/mL — ABNORMAL HIGH (ref 15–150)
Folate, Hemolysate: 487 ng/mL
Folate, RBC: 1536 ng/mL
Hematocrit: 31.7 % — ABNORMAL LOW (ref 34.0–46.6)
Iron Saturation: 24 % (ref 15–55)
Iron: 76 ug/dL (ref 27–139)
Retic Ct Pct: 3.2 % — ABNORMAL HIGH (ref 0.6–2.6)
Total Iron Binding Capacity: 312 ug/dL (ref 250–450)
UIBC: 236 ug/dL (ref 118–369)
Vitamin B-12: >2000 pg/mL — ABNORMAL HIGH (ref 232–1245)

## 2024-05-31 LAB — TSH+FREE T4
Free T4: 2.13 ng/dL — ABNORMAL HIGH (ref 0.82–1.77)
TSH: 0.52 u[IU]/mL (ref 0.450–4.500)

## 2024-06-05 ENCOUNTER — Telehealth: Payer: Self-pay | Admitting: Cardiovascular Disease

## 2024-06-05 ENCOUNTER — Ambulatory Visit

## 2024-06-05 ENCOUNTER — Ambulatory Visit (HOSPITAL_COMMUNITY)

## 2024-06-05 NOTE — Telephone Encounter (Signed)
 I spoke with the patient and advised her that I will touch base with her again this week to confirm a final plan in regards to TAVR surgery scheduling.

## 2024-06-05 NOTE — Telephone Encounter (Signed)
 Will send to TAVR navigators. Per Dr. Santa office note on 05/24/24 continue with planning for TAVR

## 2024-06-05 NOTE — Telephone Encounter (Signed)
 Pt waiting to hear about having her valve replacement. Please advise.

## 2024-06-06 ENCOUNTER — Other Ambulatory Visit: Admitting: *Deleted

## 2024-06-06 ENCOUNTER — Other Ambulatory Visit: Payer: Self-pay

## 2024-06-06 DIAGNOSIS — I350 Nonrheumatic aortic (valve) stenosis: Secondary | ICD-10-CM

## 2024-06-06 NOTE — Patient Instructions (Signed)
 Visit Information  Thank you for taking time to visit with me today. Please don't hesitate to contact me if I can be of assistance to you before our next scheduled telephone appointment.  Our next appointment is by telephone on 06/13/24 @ 11 am with Alan Ee RN  Following is a copy of your care plan:   Goals Addressed             This Visit's Progress    [facility] Transitions of Care (TOC) Care Plan       Problems:  Recent Hospitalization for treatment of CAD and  coronary stent intervention No Hospital Follow Up Provider appointment pt does not have post hospital follow up appointment Pt reports she is weighing, checking heart rate and blood pressure daily and keeping a log, pt saw cardiologist 05/24/24 and will be following up regarding planning for TAVR 06/06/24- pt reports she is doing well, states  the doctors are meeting today to discuss my case and TAVR, it will be scheduled for 1/20 or 1/28  Goal:  Over the next 30 days, the patient will not experience hospital readmission  Interventions: CAD Interventions: Counseled on increased risk of stroke due to Afib and benefits of anticoagulation for stroke prevention Reviewed importance of adherence to anticoagulant exactly as prescribed Counseled on importance of regular laboratory monitoring as prescribed Reinforced carbohydrate modified diet Reviewed signs /symptoms MI, seeking emergency medical help immediately Encouraged pt to continue daily weights, BP, HR, CBG and keep a log, take log to all provider appointments Reviewed medications, importance of taking as prescribed Reviewed all upcoming scheduled appointments   Patient Self Care Activities:  Attend all scheduled provider appointments Call pharmacy for medication refills 3-7 days in advance of running out of medications Call provider office for new concerns or questions  Notify RN Care Manager of TOC call rescheduling needs Participate in Transition of Care  Program/Attend TOC scheduled calls Perform all self care activities independently  Perform IADL's (shopping, preparing meals, housekeeping, managing finances) independently Take medications as prescribed   begin a symptom diary bring symptom diary to all appointments check pulse (heart) rate before taking medicine make a plan to eat healthy keep all lab appointments take medicine as prescribed wear medical alert identification Continue to weigh daily, check blood pressure, heart rate, blood sugar and keep a log, take log to all provider appointments  Plan:  Telephone outreach 06/13/24 @ 11 am with Alan Ee RN        Care plan and visit instructions communicated with the patient verbally today. The patient DECLINED to receive copy of care plan and patient instructions in any format.   Telephone follow up appointment with care management team member scheduled for: 06/13/24 @ 11 am  Please call the care guide team at [phone] if you need to cancel or reschedule your appointment.   Please call the Suicide and Crisis Lifeline: 988 call the [place]  National Suicide Prevention Lifeline: 551-394-6148 or TTY: 205-870-7960 TTY (423) 669-2111) to talk to a trained counselor call 1-800-273-TALK (toll free, 24 hour hotline) go to [facility] [address] [place] [phone]) call the [facility] Crisis Line: (603)390-2124 call 911 if you are experiencing a Mental Health or Behavioral Health Crisis or need someone to talk to.  Mliss Creed [facility], BSN RN Care Manager/ Transition of Care [facility] [phone]

## 2024-06-06 NOTE — Patient Outreach (Signed)
 " Transition of Care week 4  Visit Note  06/06/2024  Name: Emily [name] MRN: [id number]          DOB: 07/23/1950  Situation: Patient enrolled in [facility] 30-day program. Visit completed with patient by telephone.   Background:  Discharge Date and Diagnosis: coronary artery disease   Past Medical History:  Diagnosis Date   Anemia    Aortic stenosis    Arthritis    Bilateral carotid artery disease    CAD (coronary artery disease)    Complication of anesthesia    difficult intubation in past   Diabetes mellitus without complication (HCC)    type 2   Difficult intubation    states 'lady that did the sleep study told me I have the smallest airway she has ever seen in an adult   Dyslipidemia 06/29/2017   HFrEF (heart failure with reduced ejection fraction) (HCC)    History of blood transfusion 09/2019   GI bleed - in [place]   Hypertension    Hypothyroid    Obesity    Obstructive sleep apnea    occasional uses CPAP   Peripheral vascular disease    Persistent atrial fibrillation (HCC) 01/16/2020   Syncope and collapse 05/19/2024   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 - xrays were negative   Upper GI bleed 09/25/2019    Assessment: Patient Reported Symptoms: Cognitive Cognitive Status: No symptoms reported, Alert and oriented to person, place, and time, Able to follow simple commands, Normal speech and language skills      Neurological Neurological Review of Symptoms: No symptoms reported    HEENT HEENT Symptoms Reported: No symptoms reported      Cardiovascular Cardiovascular Symptoms Reported: No symptoms reported Weight: 180 lb (81.6 kg) Cardiovascular Self-Management Outcome: 4 (good) Cardiovascular Comment: pt is checking HR, BP and weight daily, keeping a log  Respiratory Respiratory Symptoms Reported: No symptoms reported    Endocrine Endocrine Symptoms Reported: No symptoms reported Is patient diabetic?: Yes Is patient checking blood  sugars at home?: Yes List most recent blood sugar readings, include date and time of day: RBS 166 today after drinking orange juice and eating a bowl of cereal Endocrine Self-Management Outcome: 4 (good) Endocrine Comment: reinforced carbohydrate modified diet  Gastrointestinal Gastrointestinal Symptoms Reported: No symptoms reported      Genitourinary Genitourinary Symptoms Reported: No symptoms reported    Integumentary Integumentary Symptoms Reported: No symptoms reported    Musculoskeletal Musculoskelatal Symptoms Reviewed: No symptoms reported        Psychosocial Psychosocial Symptoms Reported: No symptoms reported         Today's Vitals   06/06/24 1327  BP:   Weight: 180 lb (81.6 kg)   Pain Scale: 0-10 Pain Score: 0-No pain  Medications Reviewed Today     Reviewed by Aura Mliss LABOR, RN (Registered Nurse) on 06/06/24 at 1327  Med List Status: <None>   Medication Order Taking? Sig Documenting Provider Last Dose Status Informant  amiodarone  (PACERONE ) 200 MG tablet [id number]  Take 1 tablet (200 mg total) by mouth daily. Goodrich, Callie E, PA-C  Active   aspirin  EC 81 MG tablet [id number]  Take 1 tablet (81 mg total) by mouth daily. Swallow whole. Verlin Lonni BIRCH, MD  Active Self, Pharmacy Records  clopidogrel  (PLAVIX ) 75 MG tablet [id number]  TAKE 1 TABLET BY MOUTH EVERY DAY Bethanie Cough, PA-C  Active Self, Pharmacy Records  cyanocobalamin  ((VITAMIN B-12)) injection 1,000 mcg [id number]   Harl,  Jayson CROME, MD  Active   diphenhydramine -acetaminophen  (TYLENOL  PM) 25-500 MG TABS tablet [id number]  Take 1 tablet by mouth at bedtime as needed (pain). [provider]  Active Self, Pharmacy Records  ezetimibe  (ZETIA ) 10 MG tablet [id number]  TAKE 1 TABLET BY MOUTH EVERY DAY Lavona Agent, MD  Active Self, Pharmacy Records  furosemide  (LASIX ) 40 MG tablet [id number]  Take 1 tablet (40 mg total) by mouth daily. Gherghe, Costin M, MD  Active Self, Pharmacy Records   gabapentin  (NEURONTIN ) 300 MG capsule [id number]  Take 1 capsule (300 mg total) by mouth 2 (two) times daily. Jolinda Potter M, DO  Active   levonorgestrel (MIRENA) 20 MCG/DAY IUD [id number]  1 each by Intrauterine route once. [provider]  Active Self, Pharmacy Records  levothyroxine  (SYNTHROID ) 112 MCG tablet [id number]  TAKE 1 TABLET BY MOUTH EVERY OTHER DAY, ALTERNATING WITH 1 & 1/2 TABLETS EVERY OTHER DAY  Patient taking differently: Take 112 mcg by mouth See admin instructions. TAKE 1 TABLET BY MOUTH EVERY OTHER DAY, ALTERNATING WITH 1 & 1/2 TABLETS EVERY OTHER DAY   Gottschalk, Ashly M, DO  Active Self, Pharmacy Records           Med Note [facility], [facility]   Fri May 12, 2024  4:02 PM)    losartan  (COZAAR ) 25 MG tablet [id number]  Take 1 tablet (25 mg total) by mouth daily. Jolinda Potter M, DO  Active   metFORMIN  (GLUCOPHAGE ) 1000 MG tablet [id number]  Take 1 tablet (1,000 mg total) by mouth 2 (two) times daily with a meal.  Patient taking differently: Take 500 mg by mouth 2 (two) times daily with a meal. 500 mg BID   Jolinda Potter M, DO  Active Self, Pharmacy Records           Med Note [facility]ACK[facility] WADDELL DEL   Tue Apr 04, 2024  8:43 AM)    metoprolol  succinate (TOPROL -XL) 25 MG 24 hr tablet [id number]  Take 1 tablet (25 mg total) by mouth at bedtime. Goodrich, Callie E, PA-C  Active   polyethylene glycol (MIRALAX  / GLYCOLAX ) 17 g packet [id number]  Take 17 g by mouth daily as needed for moderate constipation. [provider]  Active Self, Pharmacy Records  rOPINIRole  (REQUIP ) 0.5 MG tablet [id number]  Take 1 tablet (0.5 mg total) by mouth at bedtime. Jolinda Potter M, DO  Active   rosuvastatin  (CRESTOR ) 20 MG tablet [id number]  Take 1 tablet (20 mg total) by mouth daily. Goodrich, Callie E, PA-C  Active   Semaglutide ,0.25 or 0.5MG /DOS, 2 MG/3ML NELMA [id number]  Inject 0.5 mg into the skin once a week. Jolinda Potter M, DO  Active   spironolactone  (ALDACTONE ) 25 MG tablet  [id number]  Take 1 tablet (25 mg total) by mouth daily. Trixie Nilda HERO, MD  Active Self, Pharmacy Records            Goals Addressed             This Visit's Progress    [facility] Transitions of Care (TOC) Care Plan       Problems:  Recent Hospitalization for treatment of CAD and  coronary stent intervention No Hospital Follow Up Provider appointment pt does not have post hospital follow up appointment Pt reports she is weighing, checking heart rate and blood pressure daily and keeping a log, pt saw cardiologist 05/24/24 and will be following up regarding planning for TAVR 06/06/24- pt reports she is doing well, states  the doctors are meeting today  to discuss my case and TAVR, it will be scheduled for 1/20 or 1/28  Goal:  Over the next 30 days, the patient will not experience hospital readmission  Interventions: CAD Interventions: Counseled on increased risk of stroke due to Afib and benefits of anticoagulation for stroke prevention Reviewed importance of adherence to anticoagulant exactly as prescribed Counseled on importance of regular laboratory monitoring as prescribed Reinforced carbohydrate modified diet Reviewed signs /symptoms MI, seeking emergency medical help immediately Encouraged pt to continue daily weights, BP, HR, CBG and keep a log, take log to all provider appointments Reviewed medications, importance of taking as prescribed Reviewed all upcoming scheduled appointments   Patient Self Care Activities:  Attend all scheduled provider appointments Call pharmacy for medication refills 3-7 days in advance of running out of medications Call provider office for new concerns or questions  Notify RN Care Manager of TOC call rescheduling needs Participate in Transition of Care Program/Attend TOC scheduled calls Perform all self care activities independently  Perform IADL's (shopping, preparing meals, housekeeping, managing finances) independently Take medications as  prescribed   begin a symptom diary bring symptom diary to all appointments check pulse (heart) rate before taking medicine make a plan to eat healthy keep all lab appointments take medicine as prescribed wear medical alert identification Continue to weigh daily, check blood pressure, heart rate, blood sugar and keep a log, take log to all provider appointments  Plan:  Telephone outreach 06/13/24 @ 11 am with Alan Ee RN        Recommendation:   PCP Follow-up  Follow Up Plan:   Telephone follow-up 06/13/24 @ 11 am  Mliss [facility], BSN RN Care Manager/ Transition of Care [place]/ [facility] Population [facility] [phone]     "

## 2024-06-12 ENCOUNTER — Other Ambulatory Visit (HOSPITAL_COMMUNITY): Payer: Self-pay

## 2024-06-12 NOTE — Addendum Note (Signed)
 Encounter addended by: Paiton Fosco N on: 06/12/2024 10:31 AM  Actions taken: Imaging Exam ended

## 2024-06-13 ENCOUNTER — Other Ambulatory Visit: Payer: Self-pay

## 2024-06-13 MED ORDER — CLOPIDOGREL BISULFATE 75 MG PO TABS: 75.0000 mg | ORAL_TABLET | Freq: Every day | ORAL | 2 refills | Status: AC

## 2024-06-13 NOTE — Transitions of Care (Post Inpatient/ED Visit) (Signed)
 " Transition of Care week 4  Visit Note  06/13/2024  Name: Emily [name] MRN: [id number]          DOB: July 02, 1950  Situation: Patient enrolled in [facility] 30-day program. Visit completed with patient by telephone.   Background:   Initial Transition Care Management Follow-up Telephone Call Discharge Date and Diagnosis: coronary artery disease   Past Medical History:  Diagnosis Date   Anemia    Aortic stenosis    Arthritis    Bilateral carotid artery disease    CAD (coronary artery disease)    Complication of anesthesia    difficult intubation in past   Diabetes mellitus without complication (HCC)    type 2   Difficult intubation    states 'lady that did the sleep study told me I have the smallest airway she has ever seen in an adult   Dyslipidemia 06/29/2017   HFrEF (heart failure with reduced ejection fraction) (HCC)    History of blood transfusion 09/2019   GI bleed - in [place]   Hypertension    Hypothyroid    Obesity    Obstructive sleep apnea    occasional uses CPAP   Peripheral vascular disease    Persistent atrial fibrillation (HCC) 01/16/2020   Syncope and collapse 05/19/2024   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 - xrays were negative   Upper GI bleed 09/25/2019    Assessment: patient reports that her concern today is a lower than normal BP.  Reports that she had back pain last night and states that she struggles to determine what is heart pain and what is not heart pain.   Pending TAVR in 1 week.   Patient Reported Symptoms: Cognitive Cognitive Status: Able to follow simple commands, Alert and oriented to person, place, and time, Normal speech and language skills      Neurological Neurological Review of Symptoms: No symptoms reported    HEENT HEENT Symptoms Reported: No symptoms reported      Cardiovascular Cardiovascular Symptoms Reported: No symptoms reported Does patient have uncontrolled Hypertension?: No Cardiovascular  Management Strategies: Routine screening, Medication therapy, Medical device Weight: 182 lb 6.4 oz (82.7 kg) Cardiovascular Comment: Reports lower than normal BP today.  self monitors BP at home.  denies swelling today.  Respiratory Respiratory Symptoms Reported: No symptoms reported Respiratory Self-Management Outcome: 5 (very good)  Endocrine Endocrine Symptoms Reported: No symptoms reported Is patient diabetic?: Yes Is patient checking blood sugars at home?: Yes List most recent blood sugar readings, include date and time of day: CBG 114    Gastrointestinal Gastrointestinal Symptoms Reported: Constipation Additional Gastrointestinal Details: a small bowel movement yesterday. Gastrointestinal Management Strategies: Medication therapy Gastrointestinal Self-Management Outcome: 4 (good) Gastrointestinal Comment: reports long history of constipation.    Genitourinary Genitourinary Symptoms Reported: No symptoms reported Genitourinary Self-Management Outcome: 5 (very good)  Integumentary Integumentary Symptoms Reported: Other Other Integumentary Symptoms: dry itchy skin-  using lotion Skin Management Strategies: Routine screening Skin Self-Management Outcome: 4 (good)  Musculoskeletal Musculoskelatal Symptoms Reviewed: No symptoms reported Additional Musculoskeletal Details: denies assistive devices. Musculoskeletal Management Strategies: Routine screening Musculoskeletal Self-Management Outcome: 5 (very good)      Psychosocial Psychosocial Symptoms Reported: Not assessed         Today's Vitals   06/13/24 1113  BP: (!) 103/42  Pulse: (!) 59  Weight: 182 lb 6.4 oz (82.7 kg)   Pain Scale: 0-10 Pain Score: 0-No pain  Medications Reviewed Today     Reviewed  by Rumalda Alan PENNER, RN (Registered Nurse) on 06/13/24 at 1108  Med List Status: <None>   Medication Order Taking? Sig Documenting Provider Last Dose Status Informant  amiodarone  (PACERONE ) 200 MG tablet [id number] Yes Take 1  tablet (200 mg total) by mouth daily. Goodrich, Callie E, PA-C  Active Self  aspirin  EC 81 MG tablet [id number] Yes Take 1 tablet (81 mg total) by mouth daily. Swallow whole. Verlin Lonni BIRCH, MD  Active Self  clopidogrel  (PLAVIX ) 75 MG tablet [id number] Yes Take 1 tablet (75 mg total) by mouth daily. Bethanie Cough, PA-C  Active   cyanocobalamin  ((VITAMIN B-12)) injection 1,000 mcg [id number]   Harl Jayson CROME, MD  Active   diphenhydramine -acetaminophen  (TYLENOL  PM) 25-500 MG TABS tablet [id number] Yes Take 1 tablet by mouth at bedtime as needed (pain). [provider]  Active Self  ezetimibe  (ZETIA ) 10 MG tablet [id number] Yes TAKE 1 TABLET BY MOUTH EVERY DAY Lavona Agent, MD  Active Self  furosemide  (LASIX ) 40 MG tablet [id number] Yes Take 1 tablet (40 mg total) by mouth daily. Gherghe, Costin M, MD  Active Self  gabapentin  (NEURONTIN ) 300 MG capsule [id number] Yes Take 1 capsule (300 mg total) by mouth 2 (two) times daily. Jolinda Potter M, DO  Active Self  levonorgestrel (MIRENA) 20 MCG/DAY IUD [id number] Yes 1 each by Intrauterine route once. [provider]  Active Self  levothyroxine  (SYNTHROID ) 112 MCG tablet [id number] Yes TAKE 1 TABLET BY MOUTH EVERY OTHER DAY, ALTERNATING WITH 1 & 1/2 TABLETS EVERY OTHER DAY Gottschalk, Ashly M, DO  Active Self           Med Note [facility], [facility]   Fri May 12, 2024  4:02 PM)    losartan  (COZAAR ) 25 MG tablet [id number] Yes Take 1 tablet (25 mg total) by mouth daily. Jolinda Potter M, DO  Active Self  metFORMIN  (GLUCOPHAGE ) 1000 MG tablet [id number] Yes Take 1 tablet (1,000 mg total) by mouth 2 (two) times daily with a meal.  Patient taking differently: Take 500 mg by mouth 2 (two) times daily with a meal. Takes 500 mg twice a day.   Jolinda Potter HERO, DO  Active Self           Med Note [facility] WADDELL DEL   Tue Apr 04, 2024  8:43 AM)    metoprolol  succinate (TOPROL -XL) 25 MG 24 hr tablet [id number] Yes Take 1 tablet (25 mg total)  by mouth at bedtime.  Patient taking differently: Take 25 mg by mouth daily. Takes in the am   Goodrich, Callie E, PA-C  Active Self  polyethylene glycol (MIRALAX  / GLYCOLAX ) 17 g packet [id number] Yes Take 17 g by mouth daily as needed for moderate constipation. [provider]  Active Self  rOPINIRole  (REQUIP ) 0.5 MG tablet [id number] Yes Take 1 tablet (0.5 mg total) by mouth at bedtime. Jolinda Potter M, DO  Active Self  rosuvastatin  (CRESTOR ) 20 MG tablet [id number] Yes Take 1 tablet (20 mg total) by mouth daily. Goodrich, Callie E, PA-C  Active Self  Semaglutide ,0.25 or 0.5MG /DOS, 2 MG/3ML SOPN [id number] Yes Inject 0.5 mg into the skin once a week. Jolinda Potter M, DO  Active Self  spironolactone  (ALDACTONE ) 25 MG tablet [id number] Yes Take 1 tablet (25 mg total) by mouth daily. Gherghe, Costin M, MD  Active Self            Goals Addressed             This Visit's Progress  [facility] Transitions of Care (TOC) Care Plan       Problems:  Recent Hospitalization for treatment of CAD and  coronary stent intervention No Hospital Follow Up Provider appointment pt does not have post hospital follow up appointment Pt reports she is weighing, checking heart rate and blood pressure daily and keeping a log, pt saw cardiologist 05/24/24 and will be following up regarding planning for TAVR 06/06/24- pt reports she is doing well, states  the doctors are meeting today to discuss my case and TAVR, it will be scheduled for 1/20 or 1/28 06/13/2024-  Reports doing well. States her concern today was a lower than normal BP.  She has checked and rechecked her BP frequently today.  108/44, 99/39, 92/38, 81/43 104/46.  States she is now taking her metoprolol  in the am vs in the pm, noted that she recently started taking spirolactone.   Denies dizziness or weight gain at this time. Continues to have difficulty with constipation. Scheduled for pre op this Friday and surgery for TAVR in 1 week.    Goal:  Over the next 30 days, the patient will not experience hospital readmission  Interventions: CAD Interventions: Counseled on increased risk of stroke due to Afib and benefits of anticoagulation for stroke prevention- denies being in a fib currently Reviewed importance of adherence to anticoagulant exactly as prescribed- reviewed all medications and patient is taking medications as prescribed.  Counseled on importance of regular laboratory monitoring as prescribed- reviewed pending labs Reinforced carbohydrate modified diet- reviewed CBG today  Reviewed signs /symptoms MI, seeking emergency medical help immediately- encouraged patient to call 911 for any concerns of heart pain. Reviewed that patients normal heart pain is felt in her back.  Encouraged pt to continue daily weights, BP, HR, CBG and keep a log, take log to all provider appointments Reviewed medications, importance of taking as prescribed Reviewed all upcoming scheduled appointments Encouraged patient to take her miralax  more often when no having a BM of 4 days or greater.  Reviewed importance of hydration.  Discussed upcoming surgery Provided my contact information for patient to call me if needed.    Patient Self Care Activities:  Attend all scheduled provider appointments Call pharmacy for medication refills 3-7 days in advance of running out of medications Call provider office for new concerns or questions  Notify RN Care Manager of TOC call rescheduling needs Participate in Transition of Care Program/Attend TOC scheduled calls Perform all self care activities independently  Perform IADL's (shopping, preparing meals, housekeeping, managing finances) independently Take medications as prescribed   begin a symptom diary bring symptom diary to all appointments check pulse (heart) rate before taking medicine make a plan to eat healthy keep all lab appointments take medicine as prescribed wear medical alert  identification Continue to weigh daily, check blood pressure, heart rate, blood sugar and keep a log, take log to all provider appointments Call MD for continued lower BP. Always check BP if dizzy Consider changing batteries in BP machine to ensure accuracy due to errors received today.   Plan:  Telephone outreach  06/19/2024  at 11 am.         Recommendation:   Continue Current Plan of Care  Follow Up Plan:   Telephone follow up appointment date/time:  06/19/2024 at 11 am  Alan Ee, RN, BSN, [employer]- Transition of Care Team.  [facility] [phone]     "

## 2024-06-13 NOTE — Patient Instructions (Signed)
 Visit Information  Thank you for taking time to visit with me today. Please don't hesitate to contact me if I can be of assistance to you before our next scheduled telephone appointment.  Following are the goals we discussed today:   Goals Addressed             This Visit's Progress    [facility] Transitions of Care (TOC) Care Plan       Problems:  Recent Hospitalization for treatment of CAD and  coronary stent intervention No Hospital Follow Up Provider appointment pt does not have post hospital follow up appointment Pt reports she is weighing, checking heart rate and blood pressure daily and keeping a log, pt saw cardiologist 05/24/24 and will be following up regarding planning for TAVR 06/06/24- pt reports she is doing well, states  the doctors are meeting today to discuss my case and TAVR, it will be scheduled for 1/20 or 1/28 06/13/2024-  Reports doing well. States her concern today was a lower than normal BP.  She has checked and rechecked her BP frequently today.  108/44, 99/39, 92/38, 81/43 104/46.  States she is now taking her metoprolol  in the am vs in the pm, noted that she recently started taking spirolactone.   Denies dizziness or weight gain at this time. Continues to have difficulty with constipation. Scheduled for pre op this Friday and surgery for TAVR in 1 week.   Goal:  Over the next 30 days, the patient will not experience hospital readmission  Interventions: CAD Interventions: Counseled on increased risk of stroke due to Afib and benefits of anticoagulation for stroke prevention- denies being in a fib currently Reviewed importance of adherence to anticoagulant exactly as prescribed- reviewed all medications and patient is taking medications as prescribed.  Counseled on importance of regular laboratory monitoring as prescribed- reviewed pending labs Reinforced carbohydrate modified diet- reviewed CBG today  Reviewed signs /symptoms MI, seeking emergency medical help  immediately- encouraged patient to call 911 for any concerns of heart pain. Reviewed that patients normal heart pain is felt in her back.  Encouraged pt to continue daily weights, BP, HR, CBG and keep a log, take log to all provider appointments Reviewed medications, importance of taking as prescribed Reviewed all upcoming scheduled appointments Encouraged patient to take her miralax  more often when no having a BM of 4 days or greater.  Reviewed importance of hydration.  Discussed upcoming surgery Provided my contact information for patient to call me if needed.    Patient Self Care Activities:  Attend all scheduled provider appointments Call pharmacy for medication refills 3-7 days in advance of running out of medications Call provider office for new concerns or questions  Notify RN Care Manager of TOC call rescheduling needs Participate in Transition of Care Program/Attend TOC scheduled calls Perform all self care activities independently  Perform IADL's (shopping, preparing meals, housekeeping, managing finances) independently Take medications as prescribed   begin a symptom diary bring symptom diary to all appointments check pulse (heart) rate before taking medicine make a plan to eat healthy keep all lab appointments take medicine as prescribed wear medical alert identification Continue to weigh daily, check blood pressure, heart rate, blood sugar and keep a log, take log to all provider appointments Call MD for continued lower BP. Always check BP if dizzy Consider changing batteries in BP machine to ensure accuracy due to errors received today.   Plan:  Telephone outreach  06/19/2024  at 11 am.  Our next appointment is by telephone on 06/19/2024 at 11 am  Please call the care guide team at [phone] if you need to cancel or reschedule your appointment.   If you are experiencing a Mental Health or Behavioral Health Crisis or need someone to talk to, please call  the Suicide and Crisis Lifeline: 988 call the [place]  National Suicide Prevention Lifeline: 205-013-2325 or TTY: 660-390-2204 TTY (820) 027-8542) to talk to a trained counselor call 1-800-273-TALK (toll free, 24 hour hotline) call 911   Care plan and visit instructions communicated with the patient verbally today. Patient agrees to receive a copy in MyChart. Active MyChart status and patient understanding of how to access instructions and care plan via MyChart confirmed with patient.     Alan Ee, RN, BSN, CEN [employer]- Transition of Care Team.  [facility] [phone]

## 2024-06-15 DIAGNOSIS — I455 Other specified heart block: Secondary | ICD-10-CM | POA: Diagnosis not present

## 2024-06-16 ENCOUNTER — Other Ambulatory Visit: Payer: Self-pay

## 2024-06-16 ENCOUNTER — Other Ambulatory Visit (HOSPITAL_COMMUNITY): Payer: Self-pay

## 2024-06-16 ENCOUNTER — Ambulatory Visit (HOSPITAL_COMMUNITY)
Admission: RE | Admit: 2024-06-16 | Discharge: 2024-06-16 | Disposition: A | Discharge status: Home / Self Care | Source: Ambulatory Visit | Attending: Cardiovascular Disease | Admitting: Cardiovascular Disease

## 2024-06-16 ENCOUNTER — Ambulatory Visit: Payer: Self-pay | Admitting: Physician Assistant

## 2024-06-16 ENCOUNTER — Encounter (HOSPITAL_COMMUNITY)
Admission: RE | Admit: 2024-06-16 | Discharge: 2024-06-16 | Disposition: A | Discharge status: Home / Self Care | Source: Ambulatory Visit | Attending: Cardiovascular Disease | Admitting: Cardiovascular Disease

## 2024-06-16 DIAGNOSIS — R011 Cardiac murmur, unspecified: Secondary | ICD-10-CM | POA: Insufficient documentation

## 2024-06-16 DIAGNOSIS — I350 Nonrheumatic aortic (valve) stenosis: Secondary | ICD-10-CM | POA: Insufficient documentation

## 2024-06-16 DIAGNOSIS — E1151 Type 2 diabetes mellitus with diabetic peripheral angiopathy without gangrene: Secondary | ICD-10-CM | POA: Insufficient documentation

## 2024-06-16 DIAGNOSIS — D649 Anemia, unspecified: Secondary | ICD-10-CM | POA: Insufficient documentation

## 2024-06-16 DIAGNOSIS — I110 Hypertensive heart disease with heart failure: Secondary | ICD-10-CM | POA: Insufficient documentation

## 2024-06-16 DIAGNOSIS — E039 Hypothyroidism, unspecified: Secondary | ICD-10-CM | POA: Insufficient documentation

## 2024-06-16 DIAGNOSIS — I480 Paroxysmal atrial fibrillation: Secondary | ICD-10-CM | POA: Insufficient documentation

## 2024-06-16 DIAGNOSIS — G4733 Obstructive sleep apnea (adult) (pediatric): Secondary | ICD-10-CM | POA: Insufficient documentation

## 2024-06-16 DIAGNOSIS — I2510 Atherosclerotic heart disease of native coronary artery without angina pectoris: Secondary | ICD-10-CM | POA: Insufficient documentation

## 2024-06-16 DIAGNOSIS — E785 Hyperlipidemia, unspecified: Secondary | ICD-10-CM | POA: Insufficient documentation

## 2024-06-16 DIAGNOSIS — I5032 Chronic diastolic (congestive) heart failure: Secondary | ICD-10-CM | POA: Insufficient documentation

## 2024-06-16 DIAGNOSIS — Z01818 Encounter for other preprocedural examination: Secondary | ICD-10-CM | POA: Insufficient documentation

## 2024-06-16 DIAGNOSIS — I083 Combined rheumatic disorders of mitral, aortic and tricuspid valves: Secondary | ICD-10-CM | POA: Insufficient documentation

## 2024-06-16 LAB — CBC
HCT: 28.8 % — ABNORMAL LOW (ref 36.0–46.0)
Hemoglobin: 9.8 g/dL — ABNORMAL LOW (ref 12.0–15.0)
MCH: 31.8 pg (ref 26.0–34.0)
MCHC: 34 g/dL (ref 30.0–36.0)
MCV: 93.5 fL (ref 80.0–100.0)
Platelets: 218 K/uL (ref 150–400)
RBC: 3.08 MIL/uL — ABNORMAL LOW (ref 3.87–5.11)
RDW: 18.1 % — ABNORMAL HIGH (ref 11.5–15.5)
WBC: 7.2 K/uL (ref 4.0–10.5)
nRBC: 0 % (ref 0.0–0.2)

## 2024-06-16 LAB — URINALYSIS, ROUTINE W REFLEX MICROSCOPIC
Bilirubin Urine: NEGATIVE
Glucose, UA: NEGATIVE mg/dL
Hgb urine dipstick: NEGATIVE
Ketones, ur: NEGATIVE mg/dL
Leukocytes,Ua: NEGATIVE
Nitrite: NEGATIVE
Protein, ur: NEGATIVE mg/dL
Specific Gravity, Urine: 1.005 (ref 1.005–1.030)
pH: 6 (ref 5.0–8.0)

## 2024-06-16 LAB — COMPREHENSIVE METABOLIC PANEL WITH GFR
ALT: 32 U/L (ref 0–44)
AST: 34 U/L (ref 15–41)
Albumin: 4.4 g/dL (ref 3.5–5.0)
Alkaline Phosphatase: 106 U/L (ref 38–126)
Anion gap: 8 (ref 5–15)
BUN: 27 mg/dL — ABNORMAL HIGH (ref 8–23)
CO2: 30 mmol/L (ref 22–32)
Calcium: 9.6 mg/dL (ref 8.9–10.3)
Chloride: 97 mmol/L — ABNORMAL LOW (ref 98–111)
Creatinine, Ser: 1.46 mg/dL — ABNORMAL HIGH (ref 0.44–1.00)
GFR, Estimated: 37 mL/min — ABNORMAL LOW
Glucose, Bld: 147 mg/dL — ABNORMAL HIGH (ref 70–99)
Potassium: 4.5 mmol/L (ref 3.5–5.1)
Sodium: 135 mmol/L (ref 135–145)
Total Bilirubin: 0.9 mg/dL (ref 0.0–1.2)
Total Protein: 7.3 g/dL (ref 6.5–8.1)

## 2024-06-16 LAB — TYPE AND SCREEN
ABO/RH(D): A NEG
Antibody Screen: NEGATIVE

## 2024-06-16 LAB — PROTIME-INR
INR: 1 (ref 0.8–1.2)
Prothrombin Time: 13.7 s (ref 11.4–15.2)

## 2024-06-16 LAB — SURGICAL PCR SCREEN
MRSA, PCR: NEGATIVE
Staphylococcus aureus: NEGATIVE

## 2024-06-16 NOTE — Progress Notes (Signed)
 All consents signed by patient at PAT lab appointment. Pt was sent home with printed copy of surgical instructions and CHG soap/CHG soap instructions. All instructions reviewed with patient and questions answered.  Patients chart send to anesthesia for review. Pt denies any respiratory illness/infection in the last two months.

## 2024-06-18 ENCOUNTER — Ambulatory Visit: Payer: Self-pay | Admitting: Student

## 2024-06-19 ENCOUNTER — Other Ambulatory Visit: Payer: Self-pay

## 2024-06-19 MED ORDER — DEXMEDETOMIDINE HCL IN NACL 400 MCG/100ML IV SOLN
0.1000 ug/kg/h | INTRAVENOUS | Status: AC
  Administered 2024-06-20: 1 ug/kg/h via INTRAVENOUS
  Filled 2024-06-19: qty 100

## 2024-06-19 MED ORDER — POTASSIUM CHLORIDE 2 MEQ/ML IV SOLN
80.0000 meq | INTRAVENOUS | Status: DC
  Filled 2024-06-19: qty 40

## 2024-06-19 MED ORDER — MAGNESIUM SULFATE 50 % IJ SOLN
40.0000 meq | INTRAMUSCULAR | Status: DC
  Filled 2024-06-19: qty 9.85

## 2024-06-19 MED ORDER — NOREPINEPHRINE 4 MG/250ML-% IV SOLN
0.0000 ug/min | INTRAVENOUS | Status: AC
  Administered 2024-06-20: 2 ug/min via INTRAVENOUS
  Filled 2024-06-19: qty 250

## 2024-06-19 MED ORDER — HEPARIN 30,000 UNITS/1000 ML (OHS) CELLSAVER SOLUTION
Status: DC
  Filled 2024-06-19: qty 1000

## 2024-06-19 MED ORDER — CEFAZOLIN SODIUM-DEXTROSE 2-4 GM/100ML-% IV SOLN
2.0000 g | INTRAVENOUS | Status: AC
  Administered 2024-06-20: 2 g via INTRAVENOUS
  Filled 2024-06-19: qty 100

## 2024-06-19 NOTE — Patient Instructions (Signed)
 Visit Information  Thank you for taking time to visit with me today. .  Following are the goals we discussed today:   Goals Addressed             This Visit's Progress    COMPLETED: [facility] Transitions of Care (TOC) Care Plan       Problems:  Recent Hospitalization for treatment of CAD and  coronary stent intervention No Hospital Follow Up Provider appointment pt does not have post hospital follow up appointment Pt reports she is weighing, checking heart rate and blood pressure daily and keeping a log, pt saw cardiologist 05/24/24 and will be following up regarding planning for TAVR 06/06/24- pt reports she is doing well, states  the doctors are meeting today to discuss my case and TAVR, it will be scheduled for 1/20 or 1/28 06/13/2024-  Reports doing well. States her concern today was a lower than normal BP.  She has checked and rechecked her BP frequently today.  108/44, 99/39, 92/38, 81/43 104/46.  States she is now taking her metoprolol  in the am vs in the pm, noted that she recently started taking spirolactone.   Denies dizziness or weight gain at this time. Continues to have difficulty with constipation. Scheduled for pre op this Friday and surgery for TAVR in 1 week.  06/19/2024  Reports feeling well. Continues to have lower BP and constipation.    Goal:  Over the next 30 days, the patient will not experience hospital readmission  Interventions: CAD Interventions: Counseled on increased risk of stroke due to Afib and benefits of anticoagulation for stroke prevention- denies being in a fib currently Reviewed importance of adherence to anticoagulant exactly as prescribed- reviewed all medications and patient is taking medications as prescribed.  Counseled on importance of regular laboratory monitoring as prescribed- reviewed pending labs Reinforced carbohydrate modified diet- reviewed CBG today  Reviewed signs /symptoms MI, seeking emergency medical help immediately- encouraged  patient to call 911 for any concerns of heart pain. Reviewed that patients normal heart pain is felt in her back.  Encouraged pt to continue daily weights, BP, HR, CBG and keep a log, take log to all provider appointments Reviewed medications, importance of taking as prescribed Reviewed all upcoming scheduled appointments Long discussion about constipation and importance of having a BM today prior to surgery tomorrow. Reviewed use of miralax , prune juice, fleets enema and or Magnesium  if needed.  Reviewed importance of hydration.  Discussed upcoming surgery Provided my contact information for patient to call me if needed.    Patient Self Care Activities:  Attend all scheduled provider appointments Call pharmacy for medication refills 3-7 days in advance of running out of medications Call provider office for new concerns or questions  Perform all self care activities independently  Perform IADL's (shopping, preparing meals, housekeeping, managing finances) independently Take medications as prescribed   begin a symptom diary bring symptom diary to all appointments check pulse (heart) rate before taking medicine make a plan to eat healthy keep all lab appointments take medicine as prescribed wear medical alert identification Continue to weigh daily, check blood pressure, heart rate, blood sugar and keep a log, take log to all provider appointments Call MD for continued lower BP. Always check BP if dizzy Consider changing batteries in BP machine to ensure accuracy due to errors received today.   Plan:  Patient has completed TOC program.        If you are experiencing a Mental Health or Behavioral Health Crisis or  need someone to talk to, please call the Suicide and Crisis Lifeline: 988 call the [place]  National Suicide Prevention Lifeline: 334 744 0913 or TTY: 909-136-8188 TTY (713)584-4483) to talk to a trained counselor call 1-800-273-TALK (toll free, 24 hour hotline) call 911    Care plan and visit instructions communicated with the patient verbally today. Patient agrees to receive a copy in MyChart. Active MyChart status and patient understanding of how to access instructions and care plan via MyChart confirmed with patient.     Alan Ee, RN, BSN, CEN [employer]- Transition of Care Team.  [facility] [phone]

## 2024-06-19 NOTE — Transitions of Care (Post Inpatient/ED Visit) (Signed)
 " Transition of Care week 5  Visit Note  06/19/2024  Name: Emily [name] MRN: [id number]          DOB: 02/25/1951  Situation: Patient enrolled in [facility] 30-day program. Visit completed with patient by telephone.   Background:   Initial Transition Care Management Follow-up Telephone Call Discharge Date and Diagnosis: coronary artery disease   Past Medical History:  Diagnosis Date   Anemia    Aortic stenosis    Arthritis    Bilateral carotid artery disease    CAD (coronary artery disease)    Complication of anesthesia    difficult intubation in past   Diabetes mellitus without complication (HCC)    type 2   Difficult intubation    states 'lady that did the sleep study told me I have the smallest airway she has ever seen in an adult   Dyslipidemia 06/29/2017   HFrEF (heart failure with reduced ejection fraction) (HCC)    History of blood transfusion 09/2019   GI bleed - in [place]   Hypertension    Hypothyroid    Obesity    Obstructive sleep apnea    occasional uses CPAP   Peripheral vascular disease    Persistent atrial fibrillation (HCC) 01/16/2020   Syncope and collapse 05/19/2024   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 - xrays were negative   Upper GI bleed 09/25/2019    Assessment: Patient reports that she is prepared for surgery tomorrow. States she will be staying with family.  Reviewed concerns for constipation.  Reviewed importance of getting bowels to move well prior to surgery tomorrow.  Patient Reported Symptoms: Cognitive Cognitive Status: Able to follow simple commands, Alert and oriented to person, place, and time, Normal speech and language skills      Neurological Neurological Review of Symptoms: No symptoms reported    HEENT HEENT Symptoms Reported: No symptoms reported      Cardiovascular Cardiovascular Symptoms Reported: Swelling in legs or feet Does patient have uncontrolled Hypertension?: No Cardiovascular Management  Strategies: Medication therapy, Routine screening Weight: 183 lb 9.6 oz (83.3 kg) Cardiovascular Self-Management Outcome: 4 (good) Cardiovascular Comment: reports slight swelling in her legs  Respiratory Respiratory Symptoms Reported: No symptoms reported Respiratory Self-Management Outcome: 4 (good)  Endocrine Endocrine Symptoms Reported: No symptoms reported Is patient diabetic?: Yes Is patient checking blood sugars at home?: Yes List most recent blood sugar readings, include date and time of day: CBG 128 Endocrine Self-Management Outcome: 4 (good)  Gastrointestinal Gastrointestinal Symptoms Reported: Constipation Additional Gastrointestinal Details: reports that she has had some pasty stools but no BM for about 2 weeks. Gastrointestinal Management Strategies: Medication therapy Gastrointestinal Self-Management Outcome: 4 (good) Gastrointestinal Comment: Reviewed importance of having a good BM today before TAVR tomorrow. I suggested taking Miralax  now and a suppository and or a Fleets enema.    Genitourinary Genitourinary Symptoms Reported: No symptoms reported    Integumentary Integumentary Symptoms Reported: No symptoms reported    Musculoskeletal Musculoskelatal Symptoms Reviewed: No symptoms reported        Psychosocial Psychosocial Symptoms Reported: Not assessed         Today's Vitals   06/19/24 1122  BP: (!) 107/46  Pulse: (!) 56  Weight: 183 lb 9.6 oz (83.3 kg)   Pain Scale: 0-10 Pain Score: 0-No pain  Medications Reviewed Today     Reviewed by Rumalda Alan PENNER, RN (Registered Nurse) on 06/19/24 at 1120  Med List Status: <None>   Medication Order  Taking? Sig Documenting Provider Last Dose Status Informant  amiodarone  (PACERONE ) 200 MG tablet [id number]  Take 1 tablet (200 mg total) by mouth daily. Goodrich, Callie E, PA-C  Active Self  aspirin  EC 81 MG tablet [id number]  Take 1 tablet (81 mg total) by mouth daily. Swallow whole. Verlin Lonni BIRCH, MD  Active  Self  clopidogrel  (PLAVIX ) 75 MG tablet [id number]  Take 1 tablet (75 mg total) by mouth daily. Bethanie Cough, PA-C  Active   cyanocobalamin  ((VITAMIN B-12)) injection 1,000 mcg [id number]   Harl Jayson CROME, MD  Active   diphenhydramine -acetaminophen  (TYLENOL  PM) 25-500 MG TABS tablet [id number]  Take 1 tablet by mouth at bedtime as needed (pain). [provider]  Active Self  ezetimibe  (ZETIA ) 10 MG tablet [id number]  TAKE 1 TABLET BY MOUTH EVERY DAY Lavona Agent, MD  Active Self  furosemide  (LASIX ) 40 MG tablet [id number]  Take 1 tablet (40 mg total) by mouth daily. Gherghe, Costin M, MD  Active Self  gabapentin  (NEURONTIN ) 300 MG capsule [id number]  Take 1 capsule (300 mg total) by mouth 2 (two) times daily. Jolinda Potter M, DO  Active Self  levonorgestrel (MIRENA) 20 MCG/DAY IUD [id number]  1 each by Intrauterine route once. [provider]  Active Self  levothyroxine  (SYNTHROID ) 112 MCG tablet [id number]  TAKE 1 TABLET BY MOUTH EVERY OTHER DAY, ALTERNATING WITH 1 & 1/2 TABLETS EVERY OTHER DAY Gottschalk, Ashly M, DO  Active Self           Med Note [facility], ALI   Fri May 12, 2024  4:02 PM)    losartan  (COZAAR ) 25 MG tablet [id number]  Take 1 tablet (25 mg total) by mouth daily. Jolinda Potter M, DO  Active Self  metFORMIN  (GLUCOPHAGE ) 1000 MG tablet [id number]  Take 1 tablet (1,000 mg total) by mouth 2 (two) times daily with a meal.  Patient not taking: Reported on 06/19/2024   Jolinda Potter HERO, DO  Active Self           Med Note JACKOLYN WADDELL DEL   Tue Apr 04, 2024  8:43 AM)    metoprolol  succinate (TOPROL -XL) 25 MG 24 hr tablet [id number]  Take 1 tablet (25 mg total) by mouth at bedtime.  Patient taking differently: Take 25 mg by mouth daily. Takes in the am   Goodrich, Callie E, PA-C  Active Self  polyethylene glycol (MIRALAX  / GLYCOLAX ) 17 g packet [id number]  Take 17 g by mouth daily as needed for moderate constipation. [provider]  Active Self   rOPINIRole  (REQUIP ) 0.5 MG tablet [id number]  Take 1 tablet (0.5 mg total) by mouth at bedtime. Jolinda Potter M, DO  Active Self  rosuvastatin  (CRESTOR ) 20 MG tablet [id number]  Take 1 tablet (20 mg total) by mouth daily. Goodrich, Callie E, PA-C  Active Self  Semaglutide ,0.25 or 0.5MG /DOS, 2 MG/3ML SOPN [id number]  Inject 0.5 mg into the skin once a week.  Patient not taking: Reported on 06/19/2024   Jolinda Potter HERO, DO  Active Self  spironolactone  (ALDACTONE ) 25 MG tablet [id number]  Take 1 tablet (25 mg total) by mouth daily. Gherghe, Costin M, MD  Active Self            Goals Addressed             This Visit's Progress    COMPLETED: [facility] Transitions of Care (TOC) Care Plan       Problems:  Recent Hospitalization for treatment of CAD and  coronary stent  intervention No Hospital Follow Up Provider appointment pt does not have post hospital follow up appointment Pt reports she is weighing, checking heart rate and blood pressure daily and keeping a log, pt saw cardiologist 05/24/24 and will be following up regarding planning for TAVR 06/06/24- pt reports she is doing well, states  the doctors are meeting today to discuss my case and TAVR, it will be scheduled for 1/20 or 1/28 06/13/2024-  Reports doing well. States her concern today was a lower than normal BP.  She has checked and rechecked her BP frequently today.  108/44, 99/39, 92/38, 81/43 104/46.  States she is now taking her metoprolol  in the am vs in the pm, noted that she recently started taking spirolactone.   Denies dizziness or weight gain at this time. Continues to have difficulty with constipation. Scheduled for pre op this Friday and surgery for TAVR in 1 week.  06/19/2024  Reports feeling well. Continues to have lower BP and constipation.    Goal:  Over the next 30 days, the patient will not experience hospital readmission  Interventions: CAD Interventions: Counseled on increased risk of stroke due to Afib and  benefits of anticoagulation for stroke prevention- denies being in a fib currently Reviewed importance of adherence to anticoagulant exactly as prescribed- reviewed all medications and patient is taking medications as prescribed.  Counseled on importance of regular laboratory monitoring as prescribed- reviewed pending labs Reinforced carbohydrate modified diet- reviewed CBG today  Reviewed signs /symptoms MI, seeking emergency medical help immediately- encouraged patient to call 911 for any concerns of heart pain. Reviewed that patients normal heart pain is felt in her back.  Encouraged pt to continue daily weights, BP, HR, CBG and keep a log, take log to all provider appointments Reviewed medications, importance of taking as prescribed Reviewed all upcoming scheduled appointments Long discussion about constipation and importance of having a BM today prior to surgery tomorrow. Reviewed use of miralax , prune juice, fleets enema and or Magnesium  if needed.  Reviewed importance of hydration.  Discussed upcoming surgery Provided my contact information for patient to call me if needed.    Patient Self Care Activities:  Attend all scheduled provider appointments Call pharmacy for medication refills 3-7 days in advance of running out of medications Call provider office for new concerns or questions  Perform all self care activities independently  Perform IADL's (shopping, preparing meals, housekeeping, managing finances) independently Take medications as prescribed   begin a symptom diary bring symptom diary to all appointments check pulse (heart) rate before taking medicine make a plan to eat healthy keep all lab appointments take medicine as prescribed wear medical alert identification Continue to weigh daily, check blood pressure, heart rate, blood sugar and keep a log, take log to all provider appointments Call MD for continued lower BP. Always check BP if dizzy Consider changing  batteries in BP machine to ensure accuracy due to errors received today.   Plan:  Patient has completed TOC program.         Recommendation:   Continue Current Plan of Care  Follow Up Plan:   Closing From:  Transitions of Care Program  Alan Ee, RN, BSN, CEN [facility]- Transition of Care Team.  [facility] [phone]     "

## 2024-06-19 NOTE — H&P (Signed)
 "  [address], [place] [address]             [phone]     Megahn Killings [facility] Medical Record #[id number] Date of Birth: 1951-02-11   Referring: Verlin Primary Care: Jolinda Norene HERO, DO Primary Cardiologist:James Lavona, MD   Chief Complaint:       Chief Complaint  Patient presents with   Leg Swelling    History of Present Illness:       Princesa Willig is a 74 yo female with history of Paroxysmal Atrial Fibrillation, H/O Watchman Device, H/O of GI Bleeding on anticoagulation Type DM, Severe Aortic Stenosis, HTN, and HLD.  She presented to the Emergency Department via EMS after contacting her PCP with complaints of swelling/edema and shortness of breath with activity.  They recommended she presented to the Emergency Department for IV Lasix .  Upon arrival EKG was obtained and showed Atrial Fibrillation with RVR, RBBB, and LVH.  Remaining workup in the ED showed questionable pleural effusion.  She was treated with IV Lasix  and Cardizem . She was admitted for further care and treatment.  She was transitioned to IV Amiodarone  and converted to NSR.  Echocardiogram was obtained and shows reduced EF of 45-50% with severe Aortic Stenosis.  She underwent catheterization which showed disease in her RCA and OM.  Of note she was due to see Dr. Wonda for TAVR workup, but ended up admitted to the hospital.  Currently the patient is doing better.  She denies pain and shortness of breath at this time.  She states she has been having issues with Atrial Fibrillation off and on over the past several weeks.  She states that she mainly experiences pain in her back, but when she is having A.Fibrillation she has a lot of pain in her neck area.  She is short of breath with activity and states that if she is walking without a shopping cart she is unable to walk very far.  She is a lifelong non-smoker.  She was last seen by her Dentist about 6 months ago without issue.  She  states she had to cancer her appointment because she is in the hospital.  She lives alone and would not have help after surgery.   Current Activity/ Functional Status: Patient is independent with mobility/ambulation, transfers, ADL's, IADL's.   Zubrod Score: At the time of surgery this patients most appropriate activity status/level should be described as: []     0    Normal activity, no symptoms []     1    Restricted in physical strenuous activity but ambulatory, able to do out light work []     2    Ambulatory and capable of self care, unable to do work activities, up and about                 more than 50%  Of the time                            []     3    Only limited self care, in bed greater than 50% of waking hours []     4    Completely disabled, no self care, confined to bed or chair []     5    Moribund       Past Medical History:  Diagnosis Date   Anemia     Arthritis  Atrial fibrillation, rapid (HCC) 10/27/2013   Bilateral carotid artery disease     Complication of anesthesia      difficult intubation in past   Diabetes mellitus without complication (HCC)      type 2   Difficult intubation      states 'lady that did the sleep study told me I have the smallest airway she has ever seen in an adult   Dyslipidemia 06/29/2017   Heart murmur     History of blood transfusion 09/2019    GI bleed - in [place]   Hypertension     Hypothyroid     Obesity     Obstructive sleep apnea      occasional uses CPAP   Peripheral vascular disease     Persistent atrial fibrillation (HCC) 01/16/2020   Tuberculosis      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 - xrays were negative   Upper GI bleed 09/25/2019               Past Surgical History:  Procedure Laterality Date   CATARACT EXTRACTION Left     CATARACT EXTRACTION W/PHACO Right 12/09/2015    Procedure: CATARACT EXTRACTION PHACO AND INTRAOCULAR LENS PLACEMENT (IOC);  Surgeon: Cherene Mania, MD;  Location: [facility];  Service:  Ophthalmology;  Laterality: Right;  CDE:10.48   COLONOSCOPY W/ POLYPECTOMY       cyst on back of neck removed       DILATION AND CURETTAGE OF UTERUS        x 5   ENDARTERECTOMY Left 08/30/2015    Procedure: LEFT CAROYID ENDARTERECTOMY WITH XENOSURE BOVINE PERICARDIUM PATCH ANGIOPLASTY;  Surgeon: Gaile LELON New, MD;  Location: [facility];  Service: Vascular;  Laterality: Left;   TONSILLECTOMY       TRANSCAROTID ARTERY REVASCULARIZATION  Right 03/19/2023    Procedure: Right Transcarotid Artery Revascularization;  Surgeon: New Gaile LELON, MD;  Location: [facility];  Service: Vascular;  Laterality: Right;   ULTRASOUND GUIDANCE FOR VASCULAR ACCESS Left 03/19/2023    Procedure: ULTRASOUND GUIDANCE FOR VASCULAR ACCESS;  Surgeon: New Gaile LELON, MD;  Location: [facility];  Service: Vascular;  Laterality: Left;   UPPER GI ENDOSCOPY        x several          Tobacco Use History  Social History        Tobacco Use  Smoking Status Never   Passive exposure: Never  Smokeless Tobacco Never      Social History        Substance and Sexual Activity  Alcohol Use No   Alcohol/week: 0.0 standard drinks of alcohol        Allergies       Allergies  Allergen Reactions   Statins Other (See Comments) and Cough      Not all statins but some cause cough and pain in legs.   Jardiance  [Empagliflozin ] Other (See Comments)      Recurrent vaginitis   Quinapril Hcl Cough   Tape Other (See Comments)      Redness, please use paper tape                 Current Facility-Administered Medications  Medication Dose Route Frequency Provider Last Rate Last Admin   [MAR Hold] acetaminophen  (TYLENOL ) tablet 650 mg  650 mg Oral Q6H PRN Ricky Fines, MD   650 mg at 04/18/24 2043    Or   [MAR Hold] acetaminophen  (TYLENOL ) suppository 650 mg  650 mg  Rectal Q6H PRN Ricky Fines, MD       amiodarone  (NEXTERONE  PREMIX) 360-4.14 MG/200ML-% (1.8 mg/mL) IV infusion  30 mg/hr Intravenous Continuous Alvan Dorn FALCON,  MD 16.67 mL/hr at 04/21/24 0037 30 mg/hr at 04/21/24 0037   [MAR Hold] docusate sodium  (COLACE) capsule 100 mg  100 mg Oral BID Ricky Fines, MD   100 mg at 04/21/24 0844   [MAR Hold] ezetimibe  (ZETIA ) tablet 10 mg  10 mg Oral Daily Ricky Fines, MD   10 mg at 04/21/24 0844   fentaNYL  (SUBLIMAZE ) injection     PRN Verlin Lonni BIRCH, MD   25 mcg at 04/21/24 0954   [MAR Hold] furosemide  (LASIX ) injection 60 mg  60 mg Intravenous BID Ricky Fines, MD   60 mg at 04/21/24 9157   Heparin  (Porcine) in NaCl 1000-0.9 UT/500ML-% SOLN     PRN Verlin Lonni BIRCH, MD   500 mL at 04/21/24 1007   heparin  sodium (porcine) injection     PRN Verlin Lonni BIRCH, MD   4,000 Units at 04/21/24 1038   [MAR Hold] insulin  aspart (novoLOG ) injection 0-15 Units  0-15 Units Subcutaneous TID WC Ricky Fines, MD   3 Units at 04/21/24 0842   [MAR Hold] insulin  aspart (novoLOG ) injection 0-5 Units  0-5 Units Subcutaneous QHS Ricky Fines, MD   2 Units at 04/20/24 2226   iohexol  (OMNIPAQUE ) 350 MG/ML injection     PRN Verlin Lonni BIRCH, MD   30 mL at 04/21/24 1050   [MAR Hold] ipratropium-albuterol  (DUONEB) 0.5-2.5 (3) MG/3ML nebulizer solution 3 mL  3 mL Nebulization Q4H PRN Ricky Fines, MD       [facility] Hold] levothyroxine  (SYNTHROID ) tablet 112 mcg  112 mcg Oral Q48H Ricky Fines, MD   112 mcg at 04/20/24 0546   [MAR Hold] levothyroxine  (SYNTHROID ) tablet 168 mcg  168 mcg Oral Q48H Ricky Fines, MD   168 mcg at 04/21/24 0552   lidocaine  (PF) (XYLOCAINE ) 1 % injection     PRN Verlin Lonni BIRCH, MD   5 mL at 04/21/24 1014   [MAR Hold] LORazepam  (ATIVAN ) injection 0.5 mg  0.5 mg Intravenous Q6H PRN Ricky Fines, MD   0.5 mg at 04/18/24 2251   [MAR Hold] metoprolol  succinate (TOPROL -XL) 24 hr tablet 50 mg  50 mg Oral Daily Ricky Fines, MD   50 mg at 04/21/24 0844   midazolam  PF (VERSED ) injection     PRN Verlin Lonni BIRCH, MD   1 mg at 04/21/24 0954   [facility] Hold] Oral care mouth  rinse  15 mL Mouth Rinse PRN Ricky Fines, MD       [facility] Hold] polyethylene glycol (MIRALAX  / GLYCOLAX ) packet 17 g  17 g Oral Daily Ricky Fines, MD   17 g at 04/21/24 0845   [MAR Hold] pravastatin  (PRAVACHOL ) tablet 40 mg  40 mg Oral q1800 Ricky Fines, MD   40 mg at 04/20/24 1824   [MAR Hold] prochlorperazine  (COMPAZINE ) injection 10 mg  10 mg Intravenous Q6H PRN Ricky Fines, MD       Radial Cocktail/Verapamil  only     PRN Verlin Lonni BIRCH, MD   10 mL at 04/21/24 1013   [MAR Hold] rOPINIRole  (REQUIP ) tablet 0.5 mg  0.5 mg Oral QHS Ricky Fines, MD   0.5 mg at 04/20/24 2226                   Facility-Administered Medications Prior to Admission  Medication Dose Route Frequency Provider Last Rate Last  Admin   cyanocobalamin  ((VITAMIN B-12)) injection 1,000 mcg  1,000 mcg Intramuscular Q30 days Harl Jayson CROME, MD   1,000 mcg at 03/21/24 1051             Medications Prior to Admission  Medication Sig Dispense Refill Last Dose/Taking   amiodarone  (PACERONE ) 200 MG tablet Take 1 tablet (200 mg total) by mouth daily. 30 tablet 0 04/17/2024 Morning   clopidogrel  (PLAVIX ) 75 MG tablet TAKE 1 TABLET BY MOUTH EVERY DAY 90 tablet 2 04/17/2024 at  7:30 AM   diltiazem  (CARDIZEM ) 30 MG tablet Take 1 tablet (30 mg total) by mouth 3 (three) times daily as needed (afib). 30 tablet 0 Past Week   diltiazem  (CARTIA  XT) 240 MG 24 hr capsule Take 1 capsule (240 mg total) by mouth daily. 30 capsule 0 04/17/2024 Morning   diphenhydramine -acetaminophen  (TYLENOL  PM) 25-500 MG TABS tablet Take 1 tablet by mouth at bedtime as needed (pain).     Past Month   ezetimibe  (ZETIA ) 10 MG tablet TAKE 1 TABLET BY MOUTH EVERY DAY 90 tablet 2 04/17/2024 Morning   fluticasone  (FLONASE ) 50 MCG/ACT nasal spray Place 2 sprays into both nostrils daily. 16 g 6 Past Week   furosemide  (LASIX ) 20 MG tablet Take 1 tablet (20 mg total) by mouth daily as needed for fluid. 90 tablet 3 04/17/2024 Morning   gabapentin   (NEURONTIN ) 300 MG capsule TAKE 1 CAPSULE (300 MG TOTAL) BY MOUTH 2 (TWO) TIMES DAILY AS NEEDED (NEUROPATHY RIGHT LEG). (Patient taking differently: Take 300 mg by mouth 2 (two) times daily.) 180 capsule 0 04/17/2024 Morning   levonorgestrel (MIRENA) 20 MCG/DAY IUD 1 each by Intrauterine route once.     Unknown   levothyroxine  (SYNTHROID ) 112 MCG tablet TAKE 1 TABLET BY MOUTH EVERY OTHER DAY, ALTERNATING WITH 1 & 1/2 TABLETS EVERY OTHER DAY (Patient taking differently: Take 112 mcg by mouth See admin instructions. TAKE 1 TABLET BY MOUTH EVERY OTHER DAY, ALTERNATING WITH 1 & 1/2 TABLETS EVERY OTHER DAY) 112 tablet 3 04/17/2024 Morning   linaclotide  (LINZESS ) 290 MCG CAPS capsule Take 1 capsule (290 mcg total) by mouth daily before breakfast.     Past Week   metFORMIN  (GLUCOPHAGE ) 1000 MG tablet Take 1 tablet (1,000 mg total) by mouth 2 (two) times daily with a meal. (Patient taking differently: Take 500 mg by mouth 2 (two) times daily with a meal.) 180 tablet 3 04/17/2024 Morning   metoprolol  succinate (TOPROL -XL) 50 MG 24 hr tablet Take 1 tablet (50 mg total) by mouth at bedtime. Hold if systolic BP  less than 100 or HR less than 50 bpm 90 tablet 3 04/16/2024   polyethylene glycol (MIRALAX  / GLYCOLAX ) 17 g packet Take 17 g by mouth daily as needed for moderate constipation.     Past Week   rOPINIRole  (REQUIP ) 0.5 MG tablet TAKE 1 TABLET BY MOUTH AT BEDTIME. 90 tablet 0 04/16/2024   Semaglutide ,0.25 or 0.5MG /DOS, 2 MG/3ML SOPN Inject 0.5 mg into the skin once a week. (Patient taking differently: Inject 0.5 mg into the skin every Monday.) 3 mL   04/17/2024 Morning   losartan  (COZAAR ) 25 MG tablet Take 1 tablet (25 mg total) by mouth daily. 90 tablet 2 04/17/2024 Morning   lovastatin  (MEVACOR ) 40 MG tablet Take 1 tablet (40 mg total) by mouth every evening. 90 tablet 3 04/16/2024             Family History  Problem Relation Age of Onset   COPD Father  Heart failure Father     Heart disease Father      Emphysema Father     Arrhythmia Sister     Arrhythmia Sister     Arrhythmia Sister          had PPM also   Cancer Sister              Review of Systems:    ROS                 Cardiac Review of Systems: Y or  [    ]= no             Chest Pain [    ]           Resting SOB [   ]         Exertional SOB  [ Y ]  Orthopnea [  ]             Pedal Edema [Y   ]     Palpitations [Y  ]         Syncope  [  ]    Presyncope [   ]             General Review of Systems: [Y] = yes [  ]=no Constitional: recent weight change [  ]; anorexia [  ]; fatigue [ Y ]; nausea [  ]; night sweats [  ]; fever [  ]; or chills [  ]                                                               Dental: Last Dentist visit: about 6 months ago             Eye : blurred vision [  ]; diplopia [   ]; vision changes [  ];  Amaurosis fugax[  ]; Resp: cough [  ];  wheezing[  ];  hemoptysis[  ]; shortness of breath[  ]; paroxysmal nocturnal dyspnea[  ]; dyspnea on exertion[Y  ]; or orthopnea[  ];  GI:  gallstones[  ], vomiting[  ];  dysphagia[  ]; melena[  ];  hematochezia [  ]; heartburn[  ];   Hx of  Colonoscopy[  ]; GU: kidney stones [  ]; hematuria[  ];   dysuria [  ];  nocturia[  ];  history of     obstruction [  ]; urinary frequency [  ]             Skin: rash, swelling[ N ];, hair loss[  ];  peripheral edema[ Y ];  or itching[  ]; Musculosketetal: myalgias[  ];  joint swelling[  ];  joint erythema[  ];  joint pain[  ];  back pain[ Y ];             Heme/Lymph: bruising[  ];  bleeding[  ];  anemia[  ];  Neuro: TIA[  ];  headaches[  ];  stroke[ N ];  vertigo[  ];  seizures[  ];   paresthesias[  ];  difficulty walking[  ];             Psych:depression[  ]; anxiety[  ];             Endocrine:  diabetes[ Y ];  thyroid  dysfunction[ Y ];              Physical Exam: BP (!) 115/98   Pulse (!) 0   Temp 97.7 F (36.5 C) (Oral)   Resp 19   Ht 5' 2 (1.575 m)   Wt 91.2 kg   SpO2 93%   BMI 36.76 kg/m    General  appearance: alert, cooperative, no distress, and morbidly obese Head: Normocephalic, without obvious abnormality, atraumatic Neck: no adenopathy, no carotid bruit, no JVD, supple, symmetrical, trachea midline, and thyroid  not enlarged, symmetric, no tenderness/mass/nodules Resp: clear to auscultation bilaterally Cardio: regular rate and rhythm and + systolic murmur GI: moderately sized pannus, non tender, non distended.. BS X 4 Extremities: extremities normal, atraumatic, no cyanosis or edema Neurologic: Grossly normal   Diagnostic Studies & Laboratory data:     Recent Radiology Findings:    Imaging Results (Last 48 hours)  No results found.     I have independently reviewed the above radiologic studies and discussed with the patient    Recent Lab Findings: Recent Labs       Lab Results  Component Value Date    WBC 10.9 (H) 04/20/2024    HGB 10.1 (L) 04/20/2024    HCT 31.8 (L) 04/20/2024    PLT 207 04/20/2024    GLUCOSE 174 (H) 04/21/2024    CHOL 148 03/14/2024    TRIG 114 03/14/2024    HDL 39 (L) 03/14/2024    LDLDIRECT 89 06/03/2021    LDLCALC 88 03/14/2024    ALT 14 04/18/2024    AST 15 04/18/2024    NA 136 04/21/2024    K 3.7 04/21/2024    CL 98 04/21/2024    CREATININE 0.74 04/21/2024    BUN 29 (H) 04/21/2024    CO2 24 04/21/2024    TSH 0.300 (L) 04/04/2024    INR 1.0 03/16/2023    HGBA1C 5.5 04/04/2024      ECHOCARDIOGRAM REPORT       Patient Name:   Emily [name] Date of Exam: 04/18/2024  Medical Rec #:  [id number]        Height:       62.0 in  Accession #:    [id number]       Weight:       204.3 lb  Date of Birth:  [birth year]        BSA:          1.929 m  Patient Age:    73 years         BP:           102/78 mmHg  Patient Gender: F                HR:           106 bpm.  Exam Location:  [facility]   Procedure: 2D Echo, Cardiac Doppler, Color Doppler and Intracardiac             Opacification Agent (Both Spectral and Color Flow Doppler were              utilized during procedure).   Indications:    Aortic stenosis I35.0    History:        Patient has prior history of Echocardiogram examinations,  most                 recent 12/07/2023. Arrythmias:Atrial Fibrillation; Risk  Factors:Hypertension.    Sonographer:    Tinnie Gosling RDCS  Referring Phys: [id number] DORN FALCON BRANCH   IMPRESSIONS     1. Left ventricular ejection fraction, by estimation, is 45 to 50%. The  left ventricle has mildly decreased function. Left ventricular endocardial  border not optimally defined to evaluate regional wall motion. There is  severe left ventricular  hypertrophy. Left ventricular diastolic parameters are indeterminate.   2. Right ventricular systolic function is normal. The right ventricular  size is normal. Tricuspid regurgitation signal is inadequate for assessing  PA pressure.   3. Left atrial size was mildly dilated.   4. The mitral valve is abnormal. Mild mitral valve regurgitation. Mild to  moderate mitral stenosis. The mean mitral valve gradient is 5.0 mmHg.  Moderate mitral annular calcification.   5. AVA VTI 0.61, mean gradient 28 mmHg, DI 0.23, SVI 23. Severe aortic  stenosis with lower than expected gradient due to decreased stroke volume  index. . The aortic valve is tricuspid. There is moderate calcification of  the aortic valve. There is  moderate thickening of the aortic valve. Aortic valve regurgitation is not  visualized. Severe aortic valve stenosis.   6. The inferior vena cava is dilated in size with >50% respiratory  variability, suggesting right atrial pressure of 8 mmHg.   FINDINGS   Left Ventricle: Left ventricular ejection fraction, by estimation, is 45  to 50%. The left ventricle has mildly decreased function. Left ventricular  endocardial border not optimally defined to evaluate regional wall motion.  The left ventricular internal  cavity size was normal in size. There is severe left ventricular   hypertrophy. Left ventricular diastolic parameters are indeterminate.   Right Ventricle: The right ventricular size is normal. Right vetricular  wall thickness was not well visualized. Right ventricular systolic  function is normal. Tricuspid regurgitation signal is inadequate for  assessing PA pressure.   Left Atrium: Left atrial size was mildly dilated.   Right Atrium: Right atrial size was normal in size.   Pericardium: There is no evidence of pericardial effusion.   Mitral Valve: The mitral valve is abnormal. There is moderate thickening  of the mitral valve leaflet(s). There is moderate calcification of the  mitral valve leaflet(s). Moderate mitral annular calcification. Mild  mitral valve regurgitation. Mild to  moderate mitral valve stenosis. The mean mitral valve gradient is 5.0  mmHg.   Tricuspid Valve: The tricuspid valve is not well visualized. Tricuspid  valve regurgitation is not demonstrated. No evidence of tricuspid  stenosis.   Aortic Valve: AVA VTI 0.61, mean gradient 28 mmHg, DI 0.23, SVI 23. Severe  aortic stenosis with lower than expected gradient due to decreased stroke  volume index. The aortic valve is tricuspid. There is moderate  calcification of the aortic valve. There  is moderate thickening of the aortic valve. There is moderate aortic valve  annular calcification. Aortic valve regurgitation is not visualized.  Severe aortic stenosis is present. Aortic valve mean gradient measures  27.5 mmHg. Aortic valve peak gradient  measures 42.6 mmHg. Aortic valve area, by VTI measures 0.61 cm.   Pulmonic Valve: The pulmonic valve was not well visualized. Pulmonic valve  regurgitation is mild. No evidence of pulmonic stenosis.   Aorta: The aortic root and ascending aorta are structurally normal, with  no evidence of dilitation.   Venous: The inferior vena cava is dilated in size with greater than 50%  respiratory variability, suggesting right atrial  pressure of 8 mmHg.  IAS/Shunts: No atrial level shunt detected by color flow Doppler.     LEFT VENTRICLE  PLAX 2D  LVIDd:         4.40 cm   Diastology  LVIDs:         3.50 cm   LV e' lateral:   3.70 cm/s  LV PW:         1.50 cm   LV E/e' lateral: 45.4  LV IVS:        1.60 cm  LVOT diam:     1.80 cm  LV SV:         45  LV SV Index:   23  LVOT Area:     2.54 cm     RIGHT VENTRICLE            IVC  RV S prime:     9.46 cm/s  IVC diam: 2.00 cm  TAPSE (M-mode): 0.7 cm   LEFT ATRIUM           Index        RIGHT ATRIUM           Index  LA diam:      4.40 cm 2.28 cm/m   RA Area:     18.40 cm  LA Vol (A4C): 66.2 ml 34.32 ml/m  RA Volume:   54.80 ml  28.41 ml/m   AORTIC VALVE  AV Area (VTI):     0.61 cm  AV Vmax:           326.49 cm/s  AV Vmean:          249.200 cm/s  AV VTI:            0.735 m  AV Peak Grad:      42.6 mmHg  AV Mean Grad:      27.5 mmHg  LVOT VTI:          0.175 m  LVOT/AV VTI ratio: 0.24    AORTA  Ao Root diam: 2.60 cm  Ao Asc diam:  3.10 cm   MITRAL VALVE  MV Area (PHT): 2.51 cm     SHUNTS  MV Mean grad:  5.0 mmHg     Systemic VTI:  0.18 m  MV Decel Time: 302 msec     Systemic Diam: 1.80 cm  MV E velocity: 168.00 cm/s   Dorn Ross MD  Electronically signed by Dorn Ross MD  Signature Date/Time: 04/18/2024/12:30:37 PM        Final     RIGHT/LEFT HEART CATH AND CORONARY ANGIOGRAPHY   Conclusion      Prox RCA-1 lesion is 90% stenosed.   Prox RCA-2 lesion is 90% stenosed.   Mid RCA lesion is 90% stenosed.   RPDA lesion is 40% stenosed.   1st Mrg lesion is 95% stenosed.   Prox LAD to Mid LAD lesion is 50% stenosed.   Mid LAD lesion is 80% stenosed.   Severe three vessel CAD Moderate calcified stenosis throughout the proximal LAD. Severe mid LAD stenosis Severe calcified stenosis in the large caliber obtuse marginal branch Dominant RCA with severe calcified lesion in the proximal and mid vessel.  Elevated right heart pressures  with wedge pressure 30-35 mmHg   Recommendations: Will need to consider CABG/AVR given multi-vessel CAD and severe AS. 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 Currently with uncontrolled atrial fib on amiodarone  and volume overload. Will hold Plavix  and ask CT surgery to see her.  Narrative & Impression  CLINICAL DATA:  Aortic valve replacement (TAVR), pre-op eval Severe aortic stenosis   EXAM: Cardiac TAVR CT   TECHNIQUE: The patient was scanned on a Siemens Force 192 slice scanner. A 120 kV retrospective scan was triggered in the descending thoracic aorta at 111 HU's. Gantry rotation speed was 270 msecs and collimation was .9 mm. The 3D data set was reconstructed in 5% intervals of the R-R cycle. Systolic and diastolic phases were analyzed on a dedicated work station using MPR, MIP and VRT modes. The patient received 80mL OMNIPAQUE  IOHEXOL  350 MG/ML SOLN of contrast.   FINDINGS: Aortic Valve:   Tricuspid aortic valve with severely reduced cusp excursion. Severely thickened and severely calcified aortic valve cusps.   AV calcium  score: 1633   Virtual Basal Annulus Measurements:   Maximum/Minimum Diameter: 25.9 x 19.2 mm   Perimeter: 70.9 mm   Area:  383 mm2   Bulky calcification below right coronary cusp external to LVOT.   Membranous septal length: 11 mm   Based on these measurements, the annulus would be suitable for a 23 mm Sapien 3 valve. Alternatively, Heart Team can consider 26 mm Evolut valve. Recommend Heart Team discussion for valve selection.   Sinus of Valsalva Measurements:   Non-coronary:  30 mm   Right - coronary:  29 mm   Left - coronary:  31 mm   Coronary height and sinus of Valsalva Height:   Left main: 13.7 mm, Left sinus: 19.4 mm   Right coronary: 16.6 mm, Right sinus: 20.1 mm   Aorta: Conventional 3 vessel branch pattern of aortic arch.   Sinotubular Junction:  27 mm   Ascending Thoracic Aorta:  33 mm    Aortic Arch:  26 mm   Descending Thoracic Aorta:  24 mm   Coronary Arteries: Normal coronary origin. Right dominance. The study was performed without use of NTG and insufficient for plaque evaluation.   Optimum Fluoroscopic Angle for Delivery: LAO 12 CAU 9   OTHER:   Atria: Biatrial dilation   Left atrial appendage: Watchman device well seated in LAA, no evidence of peridevice leak, no device related thrombus.   Mitral valve:  moderate mitral annular calcifications.   Pulmonary artery: Mild dilation of main pulmonary artery, 31 mm.   Pulmonary veins: Normal anatomy.   IMPRESSION: 1. 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 Severely thickened and severely calcified aortic valve cusps. 2. Aortic valve calcium  score: 1633 3. Annulus area: 383 mm2, suitable for 23 mm Sapien 3 valve. No protruding LVOT calcifications, calcifications are external to annulus. Membranous septal length 11 mm. 4. Sufficient coronary artery heights from annulus. 5. Optimum fluoroscopic angle for delivery:  LAO 12 CAU 9   Electronically Signed: By: Soyla Merck M.D. On: 04/24/2024 11:40     Narrative & Impression  EXAM: CTA CHEST, ABDOMEN AND PELVIS WITHOUT AND WITH CONTRAST 04/24/2024 09:08:38 AM   TECHNIQUE: CTA of the chest was performed with the administration of intravenous contrast. CTA of the abdomen and pelvis was performed without and with the administration of intravenous contrast. Multiplanar reformatted images are provided for review. MIP images are provided for review. Automated exposure control, iterative reconstruction, and/or weight based adjustment of the mA/kV was utilized to reduce the radiation dose to as low as reasonably achievable.   COMPARISON: 05/26/2014   CLINICAL HISTORY: Aortic valve replacement (TAVR), pre-op eval.   FINDINGS:   VASCULATURE: AORTA: Moderate scattered calcified plaque throughout the thoracic aorta. Extensive  calcified  plaque through the abdominal aorta without an aneurysm. No dissection.   PULMONARY ARTERIES: No pulmonary embolism with the limits of this exam.   GREAT VESSELS OF AORTIC ARCH: 3 vessel brachiocephalic arterial origin anatomy without proximal stenosis. No dissection. No arterial occlusion or significant stenosis.   CELIAC TRUNK: osteal calcified plaque without high grade stenosis. No occlusion or significant stenosis.   SUPERIOR MESENTERIC ARTERY: origin calcified plaque extending over the length of at least 3 cm resulting in at least moderate stenosis, atheromatous but patent distally.   INFERIOR MESENTERIC ARTERY: Calcified plaque at the origin which remains patent. No occlusion or significant stenosis.   RENAL ARTERIES: Single left renal artery with fairly heavily calcified ostial plaque resulting in stenosis of at least moderate severity. Duplicated right renal arteries, superior dominant with heavily calcified ostial plaque resulting in stenosis of at least moderate severity.   ILIAC ARTERIES: Scattered calcified plaque throughout bilateral common and internal iliac arteries without high grade stenosis or aneurysm. Mild tortuosity of the iliac arterial system bilaterally. Heavily calcified plaque in the right common femoral artery resulting in short segment stenosis of mild to moderate severity. Heavily calcified plaque in the proximal visualized aspect of bilateral superficial femoral arteries.   CHEST:   MEDIASTINUM: Occluder device in the left atrial appendage. Mitral annulus calcifications. Coronary leaflet calcifications. Extensive 3-vessel coronary calcifications. No mediastinal lymphadenopathy. The heart and pericardium demonstrate no acute abnormality.   LUNGS AND PLEURA: Small left and moderate right pleural effusions, new since previous. Dependent atelectasis/consolidation posteriorly in the lung bases. No pneumothorax.   THORACIC BONES AND  SOFT TISSUES: No acute bone or soft tissue abnormality.   ABDOMEN AND PELVIS:   LIVER: Mildly nodular hepatic contour suggesting cirrhosis.   GALLBLADDER AND BILE DUCTS: Gallbladder is unremarkable. No biliary ductal dilatation.   SPLEEN: The spleen is unremarkable.   PANCREAS: The pancreas is unremarkable.   ADRENAL GLANDS: Bilateral adrenal glands demonstrate no acute abnormality.   KIDNEYS, URETERS AND BLADDER: No stones in the kidneys or ureters. No hydronephrosis. No perinephric or periureteral stranding. Urinary bladder is unremarkable.   GI AND BOWEL: Stomach and duodenal sweep demonstrate no acute abnormality. There is no bowel obstruction. No abnormal bowel wall thickening or distension.   REPRODUCTIVE: Reproductive organs are unremarkable.   PERITONEUM AND RETROPERITONEUM: No ascites or free air.   LYMPH NODES: No lymphadenopathy.   ABDOMINAL BONES AND SOFT TISSUES: multilevel spondylotic change through the lumbar spine. Mild L1 vertebral compression deformity with less than 20% loss of height, age indeterminate. Mild grade 1 anterolisthesis L4-L5 probably related to bilateral facet DJD. No acute soft tissue abnormality.   IMPRESSION: 1. SMA origin calcified plaque extending over at least 3 cm resulting in at least moderate stenosis, atheromatous but patent distally. 2. Single left renal artery with heavily calcified plaque resulting in at least moderate stenosis. 3. Duplicated right renal arteries, superior dominant, with heavily calcified plaque resulting in at least moderate stenosis. 4. Heavily calcified plaque in the right common femoral artery causing a short segment stenosis of mild to moderate severity. 5. Heavily calcified plaque in the proximal superficial femoral arteries bilaterally. Correlate with ABIs.   Electronically signed by: Katheleen Faes MD 04/25/2024 09:19 AM EST RP Workstation: [id number]     Assessment / Plan:      Severe  Aortic Stenosis- due for TAVR workup, however admitted to hospital for LE edema CAD- cath today showing multivessel CAD H/O Carotid Artery Disease- Right sided stent, Left sided endarterectomy Paroxysmal Atrial  Fibrillation-- RVR on exam, currently in rate controlled.. not a candidate for anticoagulation in setting of GI bleeding.. S/P Watchman procedure placed in 2020 at [facility] Class 2 Obesity Type 2 DM- well controlled, A1C is 5.5 HLD Hypothyroid   This 74 year old woman has stage D severe, symptomatic, low flow/low gradient severe aortic stenosis with NYHA class 2 symptoms of exertional fatigue and shortness of breath consistent with diastolic congestive heart failure. I have personally reviewed her 2D echo, cardiac cath and CTA studies. Her echo shows a severely calcified aortic valve with restricted mobility. The mean gradient was 28 mm Hg with an AVA of 0.61 and DI of 0.23. SVI is low at 23. LVEF is mildly decreased at 45-50%. Her cath showed high grade RCA stenosis that was successfully treated with PCI by Dr. Verlin on 05/17/24. She was initially seen by Dr. Daniel for consideration of open surgical AVR and CABG but CTA of the chest showed diffuse aortic calcification including the ascending aorta and she was therefore not felt to be a candidate for open surgery. I agree that TAVR is the best option for treating her. Her gated cardiac CTA shows anatomy suitable for a 23 mm Sapien 3 valve. Her abdominal and pelvic CTA shows adequate pelvic vascular anatomy for transfemoral insertion.   The patient was counseled at length regarding treatment alternatives for management of severe symptomatic aortic stenosis. The risks and benefits of surgical intervention has been discussed in detail. Long-term prognosis with medical therapy was discussed. Alternative approaches such as conventional surgical aortic valve replacement, transcatheter aortic valve replacement, and palliative medical therapy were  compared and contrasted at length. This discussion was placed in the context of the patient's own specific clinical presentation and past medical history. All of her questions have been addressed.   Following the decision to proceed with transcatheter aortic valve replacement, a discussion was held regarding what types of management strategies would be attempted intraoperatively in the event of life-threatening complications, including whether or not the patient would be considered a candidate for the use of cardiopulmonary bypass and/or conversion to open sternotomy for attempted surgical intervention. Given her diffusely calcified ascending aorta I don't think she is a candidate for emergent sternotomy to manage any intra-op complications. The patient has been advised of a variety of complications that might develop including but not limited to risks of death, stroke, paravalvular leak, aortic dissection or other major vascular complications, aortic annulus rupture, device embolization, cardiac rupture or perforation, mitral regurgitation, acute myocardial infarction, arrhythmia, heart block or bradycardia requiring permanent pacemaker placement, congestive heart failure, respiratory failure, renal failure, pneumonia, infection, other late complications related to structural valve deterioration or migration, or other complications that might ultimately cause a temporary or permanent loss of functional independence or other long term morbidity. The patient provides full informed consent for the procedure as described and all questions were answered.    Dorise LOIS Fellers, MD   "

## 2024-06-19 NOTE — Anesthesia Preprocedure Evaluation (Signed)
"                                    Anesthesia Evaluation  Patient identified by MRN, date of birth, ID band Patient awake    Reviewed: Allergy & Precautions, H&P , NPO status , Patient's Chart, lab work & pertinent test results  History of Anesthesia Complications (+) DIFFICULT AIRWAY and history of anesthetic complications  Airway Mallampati: II   Neck ROM: full    Dental   Pulmonary    breath sounds clear to auscultation       Cardiovascular hypertension, + angina  + CAD and + Cardiac Stents   Rhythm:regular Rate:Normal     Neuro/Psych    GI/Hepatic   Endo/Other  diabetes, Type 2    Renal/GU      Musculoskeletal   Abdominal   Peds  Hematology   Anesthesia Other Findings   Reproductive/Obstetrics                              Anesthesia Physical Anesthesia Plan  ASA: 3  Anesthesia Plan: MAC   Post-op Pain Management:    Induction: Intravenous  PONV Risk Score and Plan: 2 and Treatment may vary due to age or medical condition  Airway Management Planned: Simple Face Mask  Additional Equipment:   Intra-op Plan:   Post-operative Plan:   Informed Consent: I have reviewed the patients History and Physical, chart, labs and discussed the procedure including the risks, benefits and alternatives for the proposed anesthesia with the patient or authorized representative who has indicated his/her understanding and acceptance.     Dental advisory given  Plan Discussed with: CRNA, Anesthesiologist and Surgeon  Anesthesia Plan Comments: (See PAT note written 06/19/2024 by Allison Zelenak, PA-C.  )         Anesthesia Quick Evaluation  "

## 2024-06-19 NOTE — Progress Notes (Signed)
 Anesthesia Chart Review:  Case: [id number] Date/Time: 06/20/24 1130   Procedures:      Transcatheter Aortic Valve Replacement, Transfemoral (Left)     ECHOCARDIOGRAM, TRANSTHORACIC   Anesthesia type: Monitor Anesthesia Care   Diagnosis: Aortic stenosis, severe [I35.0]   Pre-op diagnosis: Severe Aortic Stenosis   [facility] (C[facility]IOLOGY[facility] / [facility]   Providers: Verlin Lonni BIRCH, MD       DISCUSSION: Patient is a 74 year old female scheduled for the above procedure. 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     Reported DIFFICULT INTUBATION. Glidescope and 3 used to 08/30/2015 intubation, Grade 1 view. On 12/27/2019, easy mask ventilation, 7.0 ETT placed using MacIntosh #4, Cormack-Lehane Classification: grade IIa - partial view of glottis, 1 attempt. On 03/19/2023, Glidescope and 4 used, 7.0 mm ETT, Grade 1 view, 1 attempt.    Last A1c 5.5% on 04/04/2024. She is on metformin , semaglutide . Cardiology advised her last semaglutide  06/12/2023 and last metformin  on 06/18/2023.    Per cardiology, she can continue ASA and Plavix  through the day before surgery and instructed her to not taken any medications on the day of surgery.    Her 06/16/2024 labs showed an increase in her Creatinine to 1.46 (previously 1.18 on 05/30/2024 and 0.87 on 05/18/2024). BUN 27, eGFR 37. Cardiology advised her to hold Lasix  40 mg daily until TAVR and then they will assess LVEPD and decide on  diuretic dosing.    Anesthesia team to evaluate on the day of surgery.    VS:  Wt Readings from Last 3 Encounters:  06/13/24 82.7 kg  06/06/24 81.6 kg  05/30/24 82.1 kg   BP Readings from Last 3 Encounters:  06/13/24 (!) 103/42  06/06/24 (!) 118/58  05/30/24 112/64   Pulse Readings from Last 3 Encounters:  06/13/24 (!) 59  05/30/24 66  05/24/24 (!) 58     PROVIDERS: Jolinda Norene HERO, DO is PCP  Lavona Agent, MD is cardiologist Inocencio Chi, MD is EP cardiologist Verlin Lonni, MD is structural heart cardiologist  Serene Gaile Dixons, MD is vascular surgeon   LABS: Preoperative labs noted noted. See DISCUSSION. (all labs ordered are listed, but only abnormal results are displayed)  Labs Reviewed  CBC - Abnormal; Notable for the following components:      Result Value   RBC 3.08 (*)    Hemoglobin 9.8 (*)    HCT 28.8 (*)    RDW 18.1 (*)    All other components within normal limits  COMPREHENSIVE METABOLIC PANEL WITH GFR - Abnormal; Notable for the following components:   Chloride 97 (*)    Glucose, Bld 147 (*)    BUN 27 (*)    Creatinine, Ser 1.46 (*)    GFR, Estimated 37 (*)    All other components within normal limits  URINALYSIS, ROUTINE W REFLEX MICROSCOPIC - Abnormal; Notable for the following components:   Color, Urine STRAW (*)    All other components within normal limits  SURGICAL PCR SCREEN  PROTIME-INR  TYPE AND SCREEN     IMAGES: CXR  06/17/2023: IMPRESSION: 1. No acute disease. 2. Aortic Atherosclerosis (ICD10-I70.0).  CTA Chest/abd/pelvis 04/24/2024: IMPRESSION: 1. SMA origin calcified plaque extending over at least 3 cm resulting in at least moderate stenosis, atheromatous but patent distally. 2. Single left renal artery with heavily calcified plaque resulting in at least moderate stenosis. 3. Duplicated right renal arteries, superior dominant, with heavily calcified plaque resulting in at least moderate stenosis. 4.  Heavily calcified plaque in the right common femoral artery causing a short segment stenosis of mild to moderate severity. 5. Heavily calcified plaque in the proximal superficial femoral arteries bilaterally. Correlate with ABIs.   EKG: 06/16/2024: Sinus bradycardia Right bundle branch block Left anterior fascicular block Bifascicular block Minimal voltage criteria for LVH, may be normal variant Abnormal ECG When compared with ECG of 18-May-2024 05:43, PREVIOUS ECG IS PRESENT No significant change since last tracing Confirmed by Cherrie Sieving [id number]) on 06/18/2024 10:44:36 PM    CV: Long term Zio AT monitor 05/19/2024 - 06/02/2024: Predominant rhythm was normal sinus Frequent runs of supraventricular tachycardia with the longest run lasting 12.8 seconds. No sustained arrhythmias    Cardiac cath/PCI 05/17/2024:   Prox RCA-1 lesion is 90% stenosed.   Prox RCA-2 lesion is 90% stenosed.   Mid RCA lesion is 90% stenosed.   RPDA lesion is 40% stenosed.   A drug-eluting stent was successfully placed using a STENT ONYX FRONTIER 3.0X34.   A drug-eluting stent was successfully placed using a STENT ONYX FRONTIER 3.0X12.   Post intervention, there is a 0% residual stenosis.   Post intervention, there is a 0% residual stenosis.   Post intervention, there is a 0% residual stenosis.   Severe, heavily calcified stenoses in the proximal, mid and distal RCA\ Orbital atherectomy of the proximal and mid lesion followed by Shockwave lithotripsy of both lesions and then one drug eluting stent placement Severe stenosis distal RCA Successful PTCA/DES x 1 distal RCA - Recommendations: Continue ASA and Plavix . Continue planning for TAVR.   US  Carotid 04/24/2024: Summary:  - Right Carotid: Velocities detected in the stented right ICA suggest a 50 - 75% stenosis.  - Left Carotid: Velocities in the left ICA are consistent with a 1-39%  stenosis.  - Vertebrals: Right vertebral artery demonstrates antegrade  flow. Left  vertebral artery demonstrates bidirectional flow.    CT Coronary Morph 04/24/2024: IMPRESSION: 1. 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 Severely thickened and severely calcified aortic valve cusps. 2. Aortic valve calcium  score: 1633 3. Annulus area: 383 mm2, suitable for 23 mm Sapien 3 valve. No protruding LVOT calcifications, calcifications are external to annulus. Membranous septal length 11 mm. 4. Sufficient coronary artery heights from annulus. 5. Optimum fluoroscopic angle for delivery:  LAO 12 CAU 9   Echo 04/18/2024: IMPRESSIONS   1. Left ventricular ejection fraction, by estimation, is 45 to 50%. The  left ventricle has mildly decreased function. Left ventricular endocardial  border not optimally defined to evaluate regional wall motion. There is  severe left ventricular  hypertrophy. Left ventricular diastolic parameters are indeterminate.   2. Right ventricular systolic function is normal. The right ventricular  size is normal. Tricuspid regurgitation signal is inadequate for assessing  PA pressure.   3. Left atrial size was mildly dilated.   4. The mitral valve is abnormal. Mild mitral valve regurgitation. Mild to  moderate mitral stenosis. The mean mitral valve gradient is 5.0 mmHg.  Moderate mitral annular calcification.   5. AVA VTI 0.61, mean gradient 28 mmHg, DI 0.23,  SVI 23. Severe aortic  stenosis with lower than expected gradient due to decreased stroke volume  index. . The aortic valve is tricuspid. There is moderate calcification of  the aortic valve. There is  moderate thickening of the aortic valve. Aortic valve regurgitation is not  visualized. Severe aortic valve stenosis.   6. The inferior vena cava is dilated in size with >50% respiratory  variability, suggesting right atrial pressure of 8 mmHg.      Past Medical History:  Diagnosis Date   Anemia    Aortic stenosis    Arthritis    Bilateral carotid artery  disease    CAD (coronary artery disease)    Complication of anesthesia    difficult intubation in past   Diabetes mellitus without complication (HCC)    type 2   Difficult intubation    states 'lady that did the sleep study told me I have the smallest airway she has ever seen in an adult   Dyslipidemia 06/29/2017   HFrEF (heart failure with reduced ejection fraction) (HCC)    History of blood transfusion 09/2019   GI bleed - in [place]   Hypertension    Hypothyroid    Obesity    Obstructive sleep apnea    occasional uses CPAP   Peripheral vascular disease    Persistent atrial fibrillation (HCC) 01/16/2020   Syncope and collapse 05/19/2024   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 - xrays were negative   Upper GI bleed 09/25/2019    Past Surgical History:  Procedure Laterality Date   CATARACT EXTRACTION Left    CATARACT EXTRACTION W/PHACO Right 12/09/2015   Procedure: CATARACT EXTRACTION PHACO AND INTRAOCULAR LENS PLACEMENT (IOC);  Surgeon: Cherene Mania, MD;  Location: [facility];  Service: Ophthalmology;  Laterality: Right;  CDE:10.48   COLONOSCOPY W/ POLYPECTOMY     CORONARY ATHERECTOMY N/A 05/17/2024   Procedure: CORONARY ATHERECTOMY;  Surgeon: Verlin Lonni BIRCH, MD;  Location: [facility];  Service: Cardiovascular;  Laterality: N/A;   CORONARY LITHOTRIPSY N/A 05/17/2024   Procedure: CORONARY LITHOTRIPSY;  Surgeon: Verlin Lonni BIRCH, MD;  Location: [facility];  Service: Cardiovascular;  Laterality: N/A;   CORONARY STENT INTERVENTION N/A 05/17/2024   Procedure: CORONARY STENT INTERVENTION;  Surgeon: Verlin Lonni BIRCH, MD;  [facility]: [facility];  Service: Cardiovascular;  Laterality: N/A;   cyst on back of neck removed     DILATION AND CURETTAGE OF UTERUS     x 5   ENDARTERECTOMY Left 08/30/2015   Procedure: LEFT CAROYID ENDARTERECTOMY WITH XENOSURE BOVINE PERICARDIUM PATCH ANGIOPLASTY;  Surgeon: Gaile LELON New, MD;   Location: [facility];  Service: Vascular;  Laterality: Left;   RIGHT/LEFT HEART CATH AND CORONARY ANGIOGRAPHY N/A 04/21/2024   Procedure: RIGHT/LEFT HEART CATH AND CORONARY ANGIOGRAPHY;  Surgeon: Verlin Lonni BIRCH, MD;  [facility];  Service: Cardiovascular;  Laterality: N/A;   TONSILLECTOMY     TRANSCAROTID ARTERY REVASCULARIZATION  Right 03/19/2023   Procedure: Right Transcarotid Artery Revascularization;  Surgeon: New Gaile LELON, MD;  Location: [facility];  Service: Vascular;  Laterality: Right;   ULTRASOUND GUIDANCE FOR VASCULAR ACCESS Left 03/19/2023   Procedure: ULTRASOUND GUIDANCE FOR VASCULAR ACCESS;  Surgeon: New Gaile LELON, MD;  Location: [facility];  Service: Vascular;  Laterality: Left;   UPPER GI ENDOSCOPY     x several    MEDICATIONS:  amiodarone  (PACERONE ) 200 MG tablet   aspirin  EC 81 MG tablet  clopidogrel  (PLAVIX ) 75 MG tablet   diphenhydramine -acetaminophen  (TYLENOL  PM) 25-500 MG TABS tablet   ezetimibe  (ZETIA ) 10 MG tablet   furosemide  (LASIX ) 40 MG tablet   gabapentin  (NEURONTIN ) 300 MG capsule   levonorgestrel (MIRENA) 20 MCG/DAY IUD   levothyroxine  (SYNTHROID ) 112 MCG tablet   losartan  (COZAAR ) 25 MG tablet   metFORMIN  (GLUCOPHAGE ) 1000 MG tablet   metoprolol  succinate (TOPROL -XL) 25 MG 24 hr tablet   polyethylene glycol (MIRALAX  / GLYCOLAX ) 17 g packet   rOPINIRole  (REQUIP ) 0.5 MG tablet   rosuvastatin  (CRESTOR ) 20 MG tablet   Semaglutide ,0.25 or 0.5MG /DOS, 2 MG/3ML SOPN   spironolactone  (ALDACTONE ) 25 MG tablet    cyanocobalamin  ((VITAMIN B-12)) injection 1,000 mcg    Deantre Bourdon, PA-C Surgical Short Stay/Anesthesiology [facility] Phone [phone] [facility] Phone [phone] 06/19/2024 10:51 AM

## 2024-06-20 ENCOUNTER — Inpatient Hospital Stay (HOSPITAL_COMMUNITY): Admitting: Vascular Surgery

## 2024-06-20 ENCOUNTER — Inpatient Hospital Stay (HOSPITAL_COMMUNITY)

## 2024-06-20 ENCOUNTER — Encounter (HOSPITAL_COMMUNITY): Payer: Self-pay | Admitting: Cardiovascular Disease

## 2024-06-20 ENCOUNTER — Encounter (HOSPITAL_COMMUNITY)
Admission: RE | Disposition: A | Discharge status: Home / Self Care | Payer: Self-pay | Source: Home / Self Care | Attending: Cardiovascular Disease

## 2024-06-20 ENCOUNTER — Inpatient Hospital Stay (HOSPITAL_COMMUNITY)
Admission: RE | Admit: 2024-06-20 | Discharge: 2024-06-22 | DRG: 267 | Disposition: A | Discharge status: Home / Self Care | Attending: Cardiovascular Disease | Admitting: Cardiovascular Disease

## 2024-06-20 DIAGNOSIS — Z7984 Long term (current) use of oral hypoglycemic drugs: Secondary | ICD-10-CM | POA: Diagnosis not present

## 2024-06-20 DIAGNOSIS — I452 Bifascicular block: Secondary | ICD-10-CM | POA: Diagnosis present

## 2024-06-20 DIAGNOSIS — E1151 Type 2 diabetes mellitus with diabetic peripheral angiopathy without gangrene: Secondary | ICD-10-CM | POA: Diagnosis present

## 2024-06-20 DIAGNOSIS — G4733 Obstructive sleep apnea (adult) (pediatric): Secondary | ICD-10-CM | POA: Diagnosis present

## 2024-06-20 DIAGNOSIS — I25110 Atherosclerotic heart disease of native coronary artery with unstable angina pectoris: Secondary | ICD-10-CM

## 2024-06-20 DIAGNOSIS — D509 Iron deficiency anemia, unspecified: Secondary | ICD-10-CM | POA: Diagnosis present

## 2024-06-20 DIAGNOSIS — I10 Essential (primary) hypertension: Secondary | ICD-10-CM | POA: Diagnosis not present

## 2024-06-20 DIAGNOSIS — I4819 Other persistent atrial fibrillation: Secondary | ICD-10-CM | POA: Diagnosis present

## 2024-06-20 DIAGNOSIS — I2510 Atherosclerotic heart disease of native coronary artery without angina pectoris: Secondary | ICD-10-CM | POA: Diagnosis present

## 2024-06-20 DIAGNOSIS — Z952 Presence of prosthetic heart valve: Principal | ICD-10-CM

## 2024-06-20 DIAGNOSIS — I700 Atherosclerosis of aorta: Secondary | ICD-10-CM | POA: Diagnosis present

## 2024-06-20 DIAGNOSIS — Z6836 Body mass index (BMI) 36.0-36.9, adult: Secondary | ICD-10-CM | POA: Diagnosis not present

## 2024-06-20 DIAGNOSIS — I5022 Chronic systolic (congestive) heart failure: Secondary | ICD-10-CM | POA: Diagnosis present

## 2024-06-20 DIAGNOSIS — I480 Paroxysmal atrial fibrillation: Secondary | ICD-10-CM | POA: Diagnosis present

## 2024-06-20 DIAGNOSIS — I350 Nonrheumatic aortic (valve) stenosis: Secondary | ICD-10-CM

## 2024-06-20 DIAGNOSIS — Z9841 Cataract extraction status, right eye: Secondary | ICD-10-CM

## 2024-06-20 DIAGNOSIS — Z8249 Family history of ischemic heart disease and other diseases of the circulatory system: Secondary | ICD-10-CM | POA: Diagnosis not present

## 2024-06-20 DIAGNOSIS — I441 Atrioventricular block, second degree: Secondary | ICD-10-CM | POA: Diagnosis present

## 2024-06-20 DIAGNOSIS — I442 Atrioventricular block, complete: Secondary | ICD-10-CM

## 2024-06-20 DIAGNOSIS — Z8719 Personal history of other diseases of the digestive system: Secondary | ICD-10-CM

## 2024-06-20 DIAGNOSIS — Z961 Presence of intraocular lens: Secondary | ICD-10-CM | POA: Diagnosis present

## 2024-06-20 DIAGNOSIS — I110 Hypertensive heart disease with heart failure: Secondary | ICD-10-CM | POA: Diagnosis present

## 2024-06-20 DIAGNOSIS — E785 Hyperlipidemia, unspecified: Secondary | ICD-10-CM | POA: Diagnosis present

## 2024-06-20 DIAGNOSIS — Z7982 Long term (current) use of aspirin: Secondary | ICD-10-CM | POA: Diagnosis not present

## 2024-06-20 DIAGNOSIS — Z006 Encounter for examination for normal comparison and control in clinical research program: Secondary | ICD-10-CM

## 2024-06-20 DIAGNOSIS — E034 Atrophy of thyroid (acquired): Secondary | ICD-10-CM | POA: Diagnosis present

## 2024-06-20 DIAGNOSIS — E1142 Type 2 diabetes mellitus with diabetic polyneuropathy: Secondary | ICD-10-CM | POA: Diagnosis present

## 2024-06-20 DIAGNOSIS — Z79890 Hormone replacement therapy (postmenopausal): Secondary | ICD-10-CM

## 2024-06-20 DIAGNOSIS — Z9842 Cataract extraction status, left eye: Secondary | ICD-10-CM

## 2024-06-20 DIAGNOSIS — R011 Cardiac murmur, unspecified: Secondary | ICD-10-CM | POA: Diagnosis present

## 2024-06-20 DIAGNOSIS — Z888 Allergy status to other drugs, medicaments and biological substances status: Secondary | ICD-10-CM

## 2024-06-20 DIAGNOSIS — I080 Rheumatic disorders of both mitral and aortic valves: Principal | ICD-10-CM | POA: Diagnosis present

## 2024-06-20 DIAGNOSIS — Z7902 Long term (current) use of antithrombotics/antiplatelets: Secondary | ICD-10-CM | POA: Diagnosis not present

## 2024-06-20 DIAGNOSIS — I429 Cardiomyopathy, unspecified: Secondary | ICD-10-CM | POA: Diagnosis present

## 2024-06-20 DIAGNOSIS — Z9861 Coronary angioplasty status: Secondary | ICD-10-CM

## 2024-06-20 DIAGNOSIS — Z9109 Other allergy status, other than to drugs and biological substances: Secondary | ICD-10-CM

## 2024-06-20 DIAGNOSIS — Z95818 Presence of other cardiac implants and grafts: Secondary | ICD-10-CM

## 2024-06-20 DIAGNOSIS — Z7985 Long-term (current) use of injectable non-insulin antidiabetic drugs: Secondary | ICD-10-CM | POA: Diagnosis not present

## 2024-06-20 DIAGNOSIS — Z79899 Other long term (current) drug therapy: Secondary | ICD-10-CM

## 2024-06-20 DIAGNOSIS — E119 Type 2 diabetes mellitus without complications: Secondary | ICD-10-CM

## 2024-06-20 DIAGNOSIS — I4510 Unspecified right bundle-branch block: Secondary | ICD-10-CM | POA: Diagnosis not present

## 2024-06-20 DIAGNOSIS — Z975 Presence of (intrauterine) contraceptive device: Secondary | ICD-10-CM

## 2024-06-20 DIAGNOSIS — E66812 Obesity, class 2: Secondary | ICD-10-CM | POA: Diagnosis present

## 2024-06-20 DIAGNOSIS — I739 Peripheral vascular disease, unspecified: Secondary | ICD-10-CM | POA: Diagnosis present

## 2024-06-20 DIAGNOSIS — M1990 Unspecified osteoarthritis, unspecified site: Secondary | ICD-10-CM | POA: Diagnosis present

## 2024-06-20 HISTORY — PX: INTRAOPERATIVE TRANSTHORACIC ECHOCARDIOGRAM: SHX6523

## 2024-06-20 HISTORY — DX: Nonrheumatic aortic (valve) stenosis: I35.0

## 2024-06-20 LAB — GLUCOSE, CAPILLARY
Glucose-Capillary: 116 mg/dL — ABNORMAL HIGH (ref 70–99)
Glucose-Capillary: 94 mg/dL (ref 70–99)
Glucose-Capillary: 174 mg/dL — ABNORMAL HIGH (ref 70–99)
Glucose-Capillary: 176 mg/dL — ABNORMAL HIGH (ref 70–99)
Glucose-Capillary: 174 mg/dL — ABNORMAL HIGH (ref 70–99)

## 2024-06-20 LAB — ECHOCARDIOGRAM LIMITED
AR max vel: 1.68 cm2
AV Area VTI: 1.83 cm2
AV Area mean vel: 1.76 cm2
AV Mean grad: 6 mmHg
AV Peak grad: 10.8 mmHg
Ao pk vel: 1.64 m/s
Area-P 1/2: 2.62 cm2
MV M vel: 5.15 m/s
MV Peak grad: 106.1 mmHg
MV VTI: 1.73 cm2
S' Lateral: 3.2 cm

## 2024-06-20 LAB — POCT I-STAT, CHEM 8
BUN: 22 mg/dL (ref 8–23)
Calcium, Ion: 1.18 mmol/L (ref 1.15–1.40)
Chloride: 102 mmol/L (ref 98–111)
Creatinine, Ser: 1 mg/dL (ref 0.44–1.00)
Glucose, Bld: 158 mg/dL — ABNORMAL HIGH (ref 70–99)
HCT: 19 % — ABNORMAL LOW (ref 36.0–46.0)
Hemoglobin: 6.5 g/dL — CL (ref 12.0–15.0)
Potassium: 4.4 mmol/L (ref 3.5–5.1)
Sodium: 139 mmol/L (ref 135–145)
TCO2: 26 mmol/L (ref 22–32)

## 2024-06-20 LAB — MRSA NEXT GEN BY PCR, NASAL: MRSA by PCR Next Gen: NOT DETECTED

## 2024-06-20 LAB — POCT ACTIVATED CLOTTING TIME: Activated Clotting Time: 296 s

## 2024-06-20 MED ORDER — INSULIN ASPART 100 UNIT/ML IJ SOLN: 0.0000 [IU] | INTRAMUSCULAR | Status: DC | PRN

## 2024-06-20 MED ORDER — HEPARIN SODIUM (PORCINE) 1000 UNIT/ML IJ SOLN
INTRAMUSCULAR | Status: DC | PRN
  Administered 2024-06-20: 15000 [IU] via INTRAVENOUS

## 2024-06-20 MED ORDER — GABAPENTIN 300 MG PO CAPS
300.0000 mg | ORAL_CAPSULE | Freq: Two times a day (BID) | ORAL | Status: DC
  Administered 2024-06-20 – 2024-06-22 (×5): 300 mg via ORAL
  Filled 2024-06-20 (×5): qty 1

## 2024-06-20 MED ORDER — CLEVIDIPINE BUTYRATE 0.5 MG/ML IV EMUL
INTRAVENOUS | Status: DC | PRN
  Administered 2024-06-20: 3 mg/h via INTRAVENOUS

## 2024-06-20 MED ORDER — LEVOTHYROXINE SODIUM 112 MCG PO TABS
112.0000 ug | ORAL_TABLET | Freq: Every day | ORAL | Status: DC
  Administered 2024-06-21 – 2024-06-22 (×2): 112 ug via ORAL
  Filled 2024-06-20 (×2): qty 1

## 2024-06-20 MED ORDER — PROTAMINE SULFATE 10 MG/ML IV SOLN
INTRAVENOUS | Status: DC | PRN
  Administered 2024-06-20: 50 mg via INTRAVENOUS

## 2024-06-20 MED ORDER — ASPIRIN 81 MG PO TBEC
81.0000 mg | DELAYED_RELEASE_TABLET | Freq: Every day | ORAL | Status: DC
  Administered 2024-06-21 – 2024-06-22 (×2): 81 mg via ORAL
  Filled 2024-06-20 (×2): qty 1

## 2024-06-20 MED ORDER — LIDOCAINE HCL (PF) 1 % IJ SOLN
INTRAMUSCULAR | Status: AC
  Filled 2024-06-20: qty 30

## 2024-06-20 MED ORDER — MORPHINE SULFATE (PF) 2 MG/ML IV SOLN: 1.0000 mg | INTRAVENOUS | Status: DC | PRN

## 2024-06-20 MED ORDER — CHLORHEXIDINE GLUCONATE 0.12 % MT SOLN
OROMUCOSAL | Status: AC
  Administered 2024-06-20: 15 mL via OROMUCOSAL
  Filled 2024-06-20: qty 15

## 2024-06-20 MED ORDER — OXYCODONE HCL 5 MG PO TABS: 5.0000 mg | ORAL_TABLET | ORAL | Status: DC | PRN

## 2024-06-20 MED ORDER — ACETAMINOPHEN 325 MG PO TABS
650.0000 mg | ORAL_TABLET | Freq: Four times a day (QID) | ORAL | Status: DC | PRN
  Administered 2024-06-21: 650 mg via ORAL
  Filled 2024-06-20: qty 2

## 2024-06-20 MED ORDER — SODIUM CHLORIDE 0.9 % IV SOLN: INTRAVENOUS | Status: DC

## 2024-06-20 MED ORDER — AMIODARONE HCL 200 MG PO TABS
200.0000 mg | ORAL_TABLET | Freq: Every day | ORAL | Status: DC
  Administered 2024-06-20 – 2024-06-22 (×3): 200 mg via ORAL
  Filled 2024-06-20 (×3): qty 1

## 2024-06-20 MED ORDER — ONDANSETRON HCL 4 MG/2ML IJ SOLN
INTRAMUSCULAR | Status: DC | PRN
  Administered 2024-06-20: 4 mg via INTRAVENOUS

## 2024-06-20 MED ORDER — CHLORHEXIDINE GLUCONATE 4 % EX SOLN
60.0000 mL | Freq: Once | CUTANEOUS | Status: DC
  Filled 2024-06-20: qty 60

## 2024-06-20 MED ORDER — CHLORHEXIDINE GLUCONATE 4 % EX SOLN
30.0000 mL | CUTANEOUS | Status: DC
  Filled 2024-06-20: qty 30

## 2024-06-20 MED ORDER — HYDRALAZINE HCL 25 MG PO TABS: 25.0000 mg | ORAL_TABLET | Freq: Four times a day (QID) | ORAL | Status: DC | PRN

## 2024-06-20 MED ORDER — FENTANYL CITRATE (PF) 100 MCG/2ML IJ SOLN
INTRAMUSCULAR | Status: AC
  Filled 2024-06-20: qty 2

## 2024-06-20 MED ORDER — SODIUM CHLORIDE 0.9% FLUSH
3.0000 mL | Freq: Two times a day (BID) | INTRAVENOUS | Status: DC
  Administered 2024-06-20 – 2024-06-22 (×4): 3 mL via INTRAVENOUS

## 2024-06-20 MED ORDER — ROPINIROLE HCL 0.5 MG PO TABS
0.5000 mg | ORAL_TABLET | Freq: Every day | ORAL | Status: DC
  Administered 2024-06-20 – 2024-06-21 (×2): 0.5 mg via ORAL
  Filled 2024-06-20 (×3): qty 1

## 2024-06-20 MED ORDER — LACTATED RINGERS IV SOLN: INTRAVENOUS | Status: DC | PRN

## 2024-06-20 MED ORDER — CLOPIDOGREL BISULFATE 75 MG PO TABS
75.0000 mg | ORAL_TABLET | Freq: Every day | ORAL | Status: DC
  Administered 2024-06-21 – 2024-06-22 (×2): 75 mg via ORAL
  Filled 2024-06-20 (×2): qty 1

## 2024-06-20 MED ORDER — DIPHENHYDRAMINE-APAP (SLEEP) 25-500 MG PO TABS: 1.0000 | ORAL_TABLET | Freq: Every evening | ORAL | Status: DC | PRN

## 2024-06-20 MED ORDER — EZETIMIBE 10 MG PO TABS
10.0000 mg | ORAL_TABLET | Freq: Every day | ORAL | Status: DC
  Administered 2024-06-20 – 2024-06-22 (×3): 10 mg via ORAL
  Filled 2024-06-20 (×3): qty 1

## 2024-06-20 MED ORDER — NITROGLYCERIN IN D5W 200-5 MCG/ML-% IV SOLN: 0.0000 ug/min | INTRAVENOUS | Status: DC

## 2024-06-20 MED ORDER — ACETAMINOPHEN 650 MG RE SUPP: 650.0000 mg | Freq: Four times a day (QID) | RECTAL | Status: DC | PRN

## 2024-06-20 MED ORDER — HEPARIN (PORCINE) IN NACL 2000-0.9 UNIT/L-% IV SOLN
INTRAVENOUS | Status: DC | PRN
  Administered 2024-06-20: 1000 mL

## 2024-06-20 MED ORDER — FENTANYL CITRATE (PF) 250 MCG/5ML IJ SOLN
INTRAMUSCULAR | Status: DC | PRN
  Administered 2024-06-20 (×2): 50 ug via INTRAVENOUS

## 2024-06-20 MED ORDER — SODIUM CHLORIDE 0.9 % IV SOLN: INTRAVENOUS | Status: AC

## 2024-06-20 MED ORDER — CHLORHEXIDINE GLUCONATE 0.12 % MT SOLN: 15.0000 mL | Freq: Once | OROMUCOSAL | Status: AC

## 2024-06-20 MED ORDER — ROSUVASTATIN CALCIUM 20 MG PO TABS
20.0000 mg | ORAL_TABLET | Freq: Every day | ORAL | Status: DC
  Administered 2024-06-20 – 2024-06-22 (×3): 20 mg via ORAL
  Filled 2024-06-20 (×3): qty 1

## 2024-06-20 MED ORDER — PROPOFOL 10 MG/ML IV BOLUS
INTRAVENOUS | Status: DC | PRN
  Administered 2024-06-20: 20 mg via INTRAVENOUS

## 2024-06-20 MED ORDER — CEFAZOLIN SODIUM-DEXTROSE 2-4 GM/100ML-% IV SOLN
2.0000 g | Freq: Three times a day (TID) | INTRAVENOUS | Status: AC
  Administered 2024-06-20 – 2024-06-21 (×2): 2 g via INTRAVENOUS
  Filled 2024-06-20 (×2): qty 100

## 2024-06-20 MED ORDER — TRAMADOL HCL 50 MG PO TABS: 50.0000 mg | ORAL_TABLET | ORAL | Status: DC | PRN

## 2024-06-20 MED ORDER — IODIXANOL 320 MG/ML IV SOLN
INTRAVENOUS | Status: DC | PRN
  Administered 2024-06-20: 45 mL via INTRA_ARTERIAL

## 2024-06-20 MED ORDER — ONDANSETRON HCL 4 MG/2ML IJ SOLN: 4.0000 mg | Freq: Four times a day (QID) | INTRAMUSCULAR | Status: DC | PRN

## 2024-06-20 MED ORDER — DEXMEDETOMIDINE HCL IN NACL 80 MCG/20ML IV SOLN
INTRAVENOUS | Status: DC | PRN
  Administered 2024-06-20: 41.76 ug via INTRAVENOUS

## 2024-06-20 MED ORDER — NOREPINEPHRINE 4 MG/250ML-% IV SOLN: 0.0000 ug/min | INTRAVENOUS | Status: DC

## 2024-06-20 MED ORDER — DEXAMETHASONE SOD PHOSPHATE PF 10 MG/ML IJ SOLN
INTRAMUSCULAR | Status: DC | PRN
  Administered 2024-06-20: 10 mg via INTRAVENOUS

## 2024-06-20 MED ORDER — LIDOCAINE HCL (PF) 1 % IJ SOLN
INTRAMUSCULAR | Status: DC | PRN
  Administered 2024-06-20: 5 mL
  Administered 2024-06-20 (×2): 10 mL

## 2024-06-20 MED ORDER — SODIUM CHLORIDE 0.9 % IV SOLN: 250.0000 mL | INTRAVENOUS | Status: DC | PRN

## 2024-06-20 MED ORDER — INSULIN ASPART 100 UNIT/ML IJ SOLN
0.0000 [IU] | Freq: Three times a day (TID) | INTRAMUSCULAR | Status: DC
  Administered 2024-06-20 (×2): 4 [IU] via SUBCUTANEOUS
  Administered 2024-06-21: 8 [IU] via SUBCUTANEOUS
  Administered 2024-06-21: 4 [IU] via SUBCUTANEOUS
  Administered 2024-06-21: 2 [IU] via SUBCUTANEOUS
  Filled 2024-06-20: qty 8
  Filled 2024-06-20: qty 4
  Filled 2024-06-20: qty 2
  Filled 2024-06-20 (×2): qty 4

## 2024-06-20 MED ORDER — SODIUM CHLORIDE 0.9% FLUSH: 3.0000 mL | INTRAVENOUS | Status: DC | PRN

## 2024-06-20 MED ORDER — DIPHENHYDRAMINE HCL 25 MG PO CAPS: 25.0000 mg | ORAL_CAPSULE | Freq: Every evening | ORAL | Status: DC | PRN

## 2024-06-20 NOTE — Progress Notes (Signed)
 Spoke with nephew of patient per er contact form regarding heart monitor. Patient is currently having TVAR performed. Nephew was also to update DPR form. Will do so while at hospital

## 2024-06-20 NOTE — Discharge Summary (Incomplete)
 " HEART AND VASCULAR CENTER   MULTIDISCIPLINARY HEART VALVE TEAM  Discharge Summary    Patient ID: Emily [name] MRN: [id number]; DOB: [birth year]  Admit date: 06/20/2024 Discharge date: 06/22/2024  PCP:  Jolinda Norene HERO, DO  [facility] Cardiologist:  Lynwood Schilling, MD  [facility] Structural heart: Lonni Cash, MD [facility] Electrophysiologist:  Will Gladis Norton, MD   Discharge Diagnoses    Principal Problem:   S/P TAVR (transcatheter aortic valve replacement) Active Problems:   Hypothyroidism due to acquired atrophy of thyroid    Diabetes mellitus treated with injections of non-insulin  medication (HCC)   Iron  deficiency anemia   Peripheral arterial disease   Morbid obesity (HCC)   Nonrheumatic aortic valve stenosis   OSA on CPAP   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 device   History of GI bleed   Severe aortic stenosis   Paroxysmal atrial fibrillation (HCC)   Coronary artery disease   Allergies Allergies[1]  Diagnostic Studies/Procedures    HEART AND VASCULAR CENTER  TAVR OPERATIVE NOTE     Date of Procedure:                06/20/2024   Preoperative Diagnosis:      Severe Aortic Stenosis    Postoperative Diagnosis:    Same    Procedure:        Transcatheter Aortic Valve Replacement - Transfemoral Approach             Edwards Sapien 3 THV (size 23 mm, model # [id number], serial # [id number] )              Co-Surgeons:                        Lonni Cash, MD and Dorise Fellers , MD    Anesthesiologist:                  Hodierne   Echocardiographer:              Croitoru   Pre-operative Echo Findings: Severe aortic stenosis Normal left ventricular systolic function   Post-operative Echo Findings: No paravalvular leak Normal left ventricular systolic function _____________    Echo 06/21/24: completed but pending formal read at the time of discharge   History of Present Illness     Emily [name]  is a 74 y.o. female with a history of PAF s/p Watchman, GI bleeds, DMT2 with peripheral neuropathy, HTN, HLD, carotid artery disease s/p L CEA (2017) and R TCAR (03/2023) with 50-75% stenosis in stented RICA, severe asymmetric LVH (cMRI felt to be 2/2 AS), severe 3V CAD s/p PTCA/DES RCA 05/17/24 and severe LFLG AS who presented to [facility] on 06/20/24 for planned TAVR.   Echo July 2025 with LVEF 60-65%, severe asymmetrical LVH, normal RV, severe LAE. Moderate to severe aortic stenosis with AVA 0.74 cm2, mean gradient , DI 0.23 SVI 42. Cardiac MRI October 2025 with asymmetrical septal LVH 1.6 cm, LVE 62%. She was referred to structural heart for consultation and had an appointment with Dr. Wonda on 04/18/24, however, she was admitted that day for worsening LE edema and SOB. Found to have acute CHF in the setting of atrial fibrillation with RVR. She was diuresed with IV lasix  and treated with IV amiodarone  with eventual conversion to NSR. She is now on po amiodarone . Echo 04/18/24 with LVEF=45-50%, severe LVH, mild to mod Mitral stenosis, mild MR, severe low flow/low gradient aortic stenosis with  mean gradient 28 mmHg, AVA 0.6 cm2, DI 0.23, SVI 23. Right and left heart cath 04/21/24 with three vessel CAD. The RCA has three severe calcified lesions in the proximal and mid vessel. The LAD and OM branch have moderately severe lesions. Carotid/extremity dopplers 04/25/24 with 50-75% stenosis in stented RICA and moderate bilateral LE PAD. She was seen by Dr. Daniel for evaluation of CABG/AVR given multi-vessel CAD and severe AS. Her STS risk calculated score of morbidity and mortality is quite high at 26.9%.  She also voiced a preference for a minimally invasive approach given lack of strong family support. CTs also showed a calcified aorta making her higher surgical risk. TAVR CTs completed 04/24/24: Cardiac gated CTA of the heart revealed anatomical characteristics consistent with aortic stenosis suitable for treatment by  transcatheter aortic valve replacement without any significant complicating features. She sizes to a 23 mm Sapien 3 Ultra Resilia valve. CTA of the aorta and iliac vessels demonstrated what appear to be adequate pelvic vascular access to facilitate a left transfemoral approach. She will be at increased risk for HAVB given issues with tachy-brady and underlying bifasciular block (RBBB, LAFB). She underwent PCI of the RCA with orbital atherectomy and stenting of the proximal, mid and distal RCA on 05/18/24. We have elected for medical management of the disease in her LAD and Circumflex/OM.     The patient was evaluated by the multidisciplinary valve team and felt to have severe, symptomatic aortic stenosis and to be a suitable candidate for TAVR, which was set up for 06/20/24.   Hospital Course     Consultants: none   Severe AS:  -- S/p TAVR with a 23 mm Edwards Sapien 3 Ultra Resilia THV via the TF approach on 06/20/24.  -- Post operative echo showed EF 65%,severe MAC with MR and mod MS, normally functioning TAVR with a mean gradient of 10 mmHg and trivial PVL. -- Groin sites are stable.  -- Continue Asprin 81mg  daily and Plavix  75mg  daily.  -- Met with cardiac rehab to discuss CRP phase II.  -- Plan for discharge home today with close follow up in the outpatient setting.   Bifascicular block with transient CHB: -- Underlying RBBB/LAFB.  -- RIJ pacer left in place and monitored in the ICU.  -- Tele showed sinus with blocked PACs. -- Home Toprol  XL resumed with no HAVB. -- Will discharge home with a Zio AT to watch for delayed HAVB.   CAD:  -- s/p PTCA/DES RCA 05/17/24  -- Continue ASA, Plavix , Zetia  and Pravachol .   PAF:  -- Maintaining sinus.  -- Continue amiodarone , metoprolol .  -- Watchman in place.    Cardiomyopathy/Chronic systolic CHF:  -- LVEDP 12 mm hg at the time of TAVR.  -- Wt is stable. No volume overload on exam.  -- Continue Lasix  40 mg daily.   PAD: -- Pre TAVR CTs  showed heavily calcified plaque in the right common femoral artery causing a short segment stenosis of mild to moderate severity. Heavily calcified plaque in the proximal superficial femoral arteries bilaterally. Correlate with ABIs.  -- LE extremity dopplers 04/25/24 showed moderate bilateral LE PAD. -- Continue medical therapy.  -- Follows with [facility].  Carotid artery disease: --L CEA (2017) and R TCAR (03/2023) with 50-75% stenosis in stented RICA. -- Continue medical therapy.  -- Follows with [facility].  _____________  Discharge Vitals Blood pressure (!) 105/40, pulse 65, temperature 98.7 F (37.1 C), temperature source Oral, resp. rate 17, height 5' 2.5 (1.588  m), weight 83.5 kg, SpO2 97%.  Filed Weights   06/20/24 0906  Weight: 83.5 kg     GEN: Well nourished, well developed in no acute distress NECK: No JVD CARDIAC: RRR, 2/6 flow murmur @ LUSB. No rubs, gallops RESPIRATORY:  Clear to auscultation without rales, wheezing or rhonchi  ABDOMEN: Soft, non-tender, non-distended EXTREMITIES:  No edema; No deformity.  Groin sites clear without hematoma or ecchymosis.   Disposition   Pt is being discharged home today in good condition.  Follow-up Plans & Appointments     Follow-up Information     Sebastian Lamarr SAUNDERS, PA-C. Go on 06/29/2024.   Specialties: Cardiology, Radiology Why: @ 3:35pm, please arrive at least 20 minutes early. Contact information: [address] [place] [address] [phone]                Discharge Instructions     Amb Referral to Cardiac Rehabilitation   Complete by: As directed    Diagnosis: Valve Replacement   Valve: Aortic Comment - TAVR   After initial evaluation and assessments completed: Virtual Based Care may be provided alone or in conjunction with Phase 2 Cardiac Rehab based on patient barriers.: Yes   Intensive Cardiac Rehabilitation (ICR) [facility] location only OR Traditional Cardiac Rehabilitation (TCR) *If criteria for ICR  are not met will enroll in TCR [facility] only): Yes       Discharge Medications   Allergies as of 06/22/2024       Reactions   Statins Other (See Comments), Cough   Not all statins but some cause cough and pain in legs.   Jardiance  [empagliflozin ] Other (See Comments)   Recurrent vaginitis   Quinapril Hcl Cough   Tape Other (See Comments)   Redness, please use paper tape        Medication List     TAKE these medications    rOPINIRole  0.5 MG tablet Commonly known as: REQUIP  Take 1 tablet (0.5 mg total) by mouth at bedtime. The timing of this medication is very important.   amiodarone  200 MG tablet Commonly known as: PACERONE  Take 1 tablet (200 mg total) by mouth daily.   aspirin  EC 81 MG tablet Take 1 tablet (81 mg total) by mouth daily. Swallow whole.   clopidogrel  75 MG tablet Commonly known as: PLAVIX  Take 1 tablet (75 mg total) by mouth daily.   diphenhydramine -acetaminophen  25-500 MG Tabs tablet Commonly known as: TYLENOL  PM Take 1 tablet by mouth at bedtime as needed (pain).   ezetimibe  10 MG tablet Commonly known as: ZETIA  TAKE 1 TABLET BY MOUTH EVERY DAY   furosemide  40 MG tablet Commonly known as: LASIX  Take 1 tablet (40 mg total) by mouth daily.   gabapentin  300 MG capsule Commonly known as: NEURONTIN  Take 1 capsule (300 mg total) by mouth 2 (two) times daily.   levonorgestrel 20 MCG/DAY Iud Commonly known as: MIRENA 1 each by Intrauterine route once.   levothyroxine  112 MCG tablet Commonly known as: SYNTHROID  TAKE 1 TABLET BY MOUTH EVERY OTHER DAY, ALTERNATING WITH 1 & 1/2 TABLETS EVERY OTHER DAY   losartan  25 MG tablet Commonly known as: COZAAR  Take 1 tablet (25 mg total) by mouth daily.   metFORMIN  1000 MG tablet Commonly known as: GLUCOPHAGE  Take 1 tablet (1,000 mg total) by mouth 2 (two) times daily with a meal.   metoprolol  succinate 25 MG 24 hr tablet Commonly known as: TOPROL -XL Take 1 tablet (25 mg total) by mouth at  bedtime. What changed:  when  to take this additional instructions   polyethylene glycol 17 g packet Commonly known as: MIRALAX  / GLYCOLAX  Take 17 g by mouth daily as needed for moderate constipation.   rosuvastatin  20 MG tablet Commonly known as: CRESTOR  Take 1 tablet (20 mg total) by mouth daily.   Semaglutide (0.25 or 0.5MG /DOS) 2 MG/3ML Sopn Inject 0.5 mg into the skin once a week.   spironolactone  25 MG tablet Commonly known as: ALDACTONE  Take 1 tablet (25 mg total) by mouth daily.          Outstanding Labs/Studies   none  ______________________  Duration of Discharge Encounter: APP Time: 15 minutes    Signed, Lamarr Hummer, PA-C 06/22/2024, 9:55 AM [phone]  Emily [name] was seen by me today along with Izetta Hummer, PA-C. I have personally performed an evaluation on this patient.  My findings are as follows:  74 y.o. female with CAD and severe AS now s/p TAVR. She is doing well. POD 2 post TF TAVR. Valve working well by echo. I reviewed the echo personally.  Groins stable.  Tele reviewed with EP team. Blocked PACs have resolved.  Sinus on tele today.  No chest pain or dyspnea  Data: EKG(s) and pertinent labs, studies, etc were personally reviewed and interpreted by me:  EKG reviewed by me: sinus, RBBB Telemetry reviewed by me: sinus All labs reviewed by me Otherwise, I agree with data as outlined by the advanced practice provider.  Exam performed by me: Gen: NAD Neck: No JVD Cardiac: RRR without murmur Lungs: clear bilaterally Extremities: No LE edema  My Assessment and Plan:  Severe AS: s/p TAVR. Doing well. Continue ASA and Plavix  with recent coronary stenting.   CAD without angina: No chest pain. DAPT with ASA and Plavix .   RBBB/blocked PACs: sinus today. EP team ok with d/c home. Will arrange a cardiac monitor.   The patient will be discharged home today  I spent 20 minutes seeing the patient. During that time, I  reviewed the labs, EKG, telemetry, echo images, evaluated their symptoms and performed an examination. This time also included plan formulation, discussion of the plan with the patient and the time spent with documentation.   Signed,  Lonni Cash, MD  06/22/2024 10:35 AM       [1]  Allergies Allergen Reactions   Statins Other (See Comments) and Cough    Not all statins but some cause cough and pain in legs.   Jardiance  [Empagliflozin ] Other (See Comments)    Recurrent vaginitis   Quinapril Hcl Cough   Tape Other (See Comments)    Redness, please use paper tape   "

## 2024-06-20 NOTE — Anesthesia Postprocedure Evaluation (Addendum)
"   Anesthesia Post Note  Patient: Emily [name]  Procedure(s) Performed: Transcatheter Aortic Valve Replacement, Transfemoral (Left) ECHOCARDIOGRAM, TRANSTHORACIC     Patient location during evaluation: SICU Anesthesia Type: MAC Level of consciousness: awake and alert Pain management: pain level controlled Vital Signs Assessment: post-procedure vital signs reviewed and stable Respiratory status: spontaneous breathing, nonlabored ventilation, respiratory function stable and patient connected to nasal cannula oxygen Cardiovascular status: stable and blood pressure returned to baseline Postop Assessment: no apparent nausea or vomiting Anesthetic complications: no   There were no known notable events for this encounter.  Last Vitals:  Vitals:   06/20/24 1430 06/20/24 1500  BP: (!) 143/47   Pulse: (!) 56 (!) 54  Resp: 14 14  Temp:    SpO2: 98% 97%    Last Pain:  Vitals:   06/20/24 1420  TempSrc:   PainSc: 0-No pain                 Tequia Wolman S      "

## 2024-06-20 NOTE — Interval H&P Note (Signed)
 History and Physical Interval Note:  06/20/2024 11:26 AM  Emily [name]  has presented today for surgery, with the diagnosis of Severe Aortic Stenosis.  The various methods of treatment have been discussed with the patient and family. After consideration of risks, benefits and other options for treatment, the patient has consented to  Procedures: Transcatheter Aortic Valve Replacement, Transfemoral (Left) ECHOCARDIOGRAM, TRANSTHORACIC (N/A) as a surgical intervention.  The patient's history has been reviewed, patient examined, no change in status, stable for surgery.  I have reviewed the patient's chart and labs.  Questions were answered to the patient's satisfaction.     Toneisha Savary K Chayil Gantt

## 2024-06-20 NOTE — CV Procedure (Signed)
 " HEART AND VASCULAR CENTER  TAVR OPERATIVE NOTE   Date of Procedure:  06/20/2024  Preoperative Diagnosis: Severe Aortic Stenosis   Postoperative Diagnosis: Same   Procedure:   Transcatheter Aortic Valve Replacement - Transfemoral Approach  Edwards Sapien 3 THV (size 23 mm, model # [id number], serial # [id number] )   Co-Surgeons:  Lonni Cash, MD and Dorise Fellers , MD   Anesthesiologist:  Hodierne  Echocardiographer:  Croitoru  Pre-operative Echo Findings: Severe aortic stenosis Normal left ventricular systolic function  Post-operative Echo Findings: No paravalvular leak Normal left ventricular systolic function  BRIEF CLINICAL NOTE AND INDICATIONS FOR SURGERY  74 yo female with history of PAF s/p Watchman due to recurrent GI bleeding, bifasciular block (RBBB, LAFB), DM type 2 with peripheral neuropathy, HTN, HLD, carotid artery disease s/p L CEA (2017) and right TCAR (03/2023) with 50-75% stenosis of stented RICA, severe asymmetric LVH (cMRI felt to be 2/2 AS), severe 3V CAD, ischemic cardiomyopathy, chronic systolic CHF and severe low flow/low gradient aortic stenosis who is here today for hospital follow up. Echo July 2025 with LVEF 60-65%, severe asymmetrical LVH, normal RV, severe LAE. Moderate to severe aortic stenosis with AVA 0.74 cm2, mean gradient , DI 0.23 SVI 42. Cardiac MRI October 2025 with asymmetrical septal LVH 1.6 cm, LVE 62%. She was referred to structural heart for consultation and had an appointment with Dr. Wonda on 04/18/24, however, she was admitted that day for worsening LE edema and SOB. Found to have acute CHF in the setting of atrial fibrillation with RVR. She was diuresed with IV lasix  and treated with IV amiodarone  with eventual conversion to NSR. She is now on po amiodarone . Echo 04/18/24 with LVEF=45-50%, severe LVH, mild to mod Mitral stenosis, mild MR, severe low flow/low gradient aortic stenosis with mean gradient 28 mmHg, AVA 0.6 cm2, DI  0.23, SVI 23. Right and left heart cath 04/21/24 with three vessel CAD. The RCA has three severe calcified lesions in the proxima and mid vessel. The LAD and OM branch have moderately severe lesions. Carotid/extremity dopplers 04/25/24 with 50-75% stenosis in stented RICA and moderate bilateral LE PAD. She was seen by Dr. Daniel for evaluation of CABG/AVR given multi-vessel CAD and severe AS. Her STS risk calculated score of morbidity and mortality is quite high at 26.9%.  She also voiced a preference for a minimally invasive approach given lack of strong family support. CTs also showed a calcified aorta making her higher surgical risk. TAVR CTs completed 04/24/24: Cardiac gated CTA of the heart revealed anatomical characteristics consistent with aortic stenosis suitable for treatment by transcatheter aortic valve replacement without any significant complicating features. She underwent PCI of the RCA with orbital atherectomy and stenting of the proximal, mid and distal RCA on 05/18/24. We have elected for medical management of the disease in her LAD and Circumflex/OM.  During the course of the patient's preoperative work up they have been evaluated comprehensively by a multidisciplinary team of specialists coordinated through the Multidisciplinary Heart Valve Clinic in the [facility].  They have been demonstrated to suffer from symptomatic severe aortic stenosis as noted above. The patient has been counseled extensively as to the relative risks and benefits of all options for the treatment of severe aortic stenosis including long term medical therapy, conventional surgery for aortic valve replacement, and transcatheter aortic valve replacement.  The patient has been independently evaluated by Dr. Daniel with CT surgery and they are felt to be at high risk  for conventional surgical aortic valve replacement. The surgeon indicated the patient would be a poor candidate for conventional surgery.  Based upon review of all of the patient's preoperative diagnostic tests they are felt to be candidate for transcatheter aortic valve replacement using the transfemoral approach as an alternative to high risk conventional surgery.    Following the decision to proceed with transcatheter aortic valve replacement, a discussion has been held regarding what types of management strategies would be attempted intraoperatively in the event of life-threatening complications, including whether or not the patient would be considered a candidate for the use of cardiopulmonary bypass and/or conversion to open sternotomy for attempted surgical intervention.  The patient has been advised of a variety of complications that might develop peculiar to this approach including but not limited to risks of death, stroke, paravalvular leak, aortic dissection or other major vascular complications, aortic annulus rupture, device embolization, cardiac rupture or perforation, acute myocardial infarction, arrhythmia, heart block or bradycardia requiring permanent pacemaker placement, congestive heart failure, respiratory failure, renal failure, pneumonia, infection, other late complications related to structural valve deterioration or migration, or other complications that might ultimately cause a temporary or permanent loss of functional independence or other long term morbidity.  The patient provides full informed consent for the procedure as described and all questions were answered preoperatively.    DETAILS OF THE OPERATIVE PROCEDURE  PREPARATION:   The patient is brought to the operating room on the above mentioned date and central monitoring was established by the anesthesia team including placement of a radial arterial line. The patient is placed in the supine position on the operating table.  Intravenous antibiotics are administered. Conscious sedation is used.   Baseline transthoracic echocardiogram was performed. The  patient's chest, abdomen, both groins, and both lower extremities are prepared and draped in a sterile manner. A time out procedure is performed.   PERIPHERAL ACCESS:   The right neck was prepped and draped. Using u/s guidance, I placed a 6 French in the right internal jugular vein.  A temporary transvenous pacemaker catheter was passed through the venous sheath under fluoroscopic guidance into the right ventricle.  The pacemaker was tested to ensure stable lead placement and pacemaker capture.Using the modified Seldinger technique, femoral arterial access was obtained with placement of a 6 Fr sheath in the left femoral artery  using u/s guidance.  A pigtail diagnostic catheter was passed through the femoral arterial sheath under fluoroscopic guidance into the aortic root.  Aortic root angiography was performed in order to determine the optimal angiographic angle for valve deployment.  TRANSFEMORAL ACCESS:  A micropuncture kit was used to gain access to the right femoral artery using u/s guidance. Position confirmed with angiography. Pre-closure with double ProGlide closure devices. The patient was heparinized systemically and ACT verified > 250 seconds.    A 14 Fr transfemoral E-sheath was introduced into the right femoral artery after progressively dilating over an Amplatz superstiff wire. An AL-1 catheter was used to direct a straight-tip exchange length wire across the native aortic valve into the left ventricle. This was exchanged out for a pigtail catheter and position was confirmed in the LV apex. Simultaneous LV and Ao pressures were recorded.  The pigtail catheter was then exchanged for a Safari wire in the LV apex.   TRANSCATHETER HEART VALVE DEPLOYMENT:  An Edwards Sapien 3 THV (size 23 mm) was prepared and crimped per manufacturer's guidelines, and the proper orientation of the valve is confirmed on the [employer]  delivery system. The valve was advanced through the introducer sheath  using normal technique until in an appropriate position in the abdominal aorta beyond the sheath tip. The balloon was then retracted and using the fine-tuning wheel was centered on the valve. The valve was then advanced across the aortic arch using appropriate flexion of the catheter. The valve was carefully positioned across the aortic valve annulus. The Commander catheter was retracted using normal technique. Once final position of the valve has been confirmed by angiographic assessment, the valve is deployed while temporarily holding ventilation and during rapid ventricular pacing to maintain systolic blood pressure < 50 mmHg and pulse pressure < 10 mmHg. The balloon inflation is held for >3 seconds after reaching full deployment volume. Once the balloon has fully deflated the balloon is retracted into the ascending aorta and valve function is assessed using TTE. There is felt to be no paravalvular leak and no central aortic insufficiency.  The patient's hemodynamic recovery following valve deployment is good.  The deployment balloon and guidewire are both removed. Echo demostrated acceptable post-procedural gradients, stable mitral valve function, and no AI.   PROCEDURE COMPLETION:  The sheath was then removed and closure devices were completed. Protamine  was administered once femoral arterial repair was complete. The temporary pacemaker was left in place. The pigtail catheter and femoral sheath were removed with a Mynx closure device placed in the artery.     The patient tolerated the procedure well and is transported to the surgical intensive care in stable condition. There were no immediate intraoperative complications. All sponge instrument and needle counts are verified correct at completion of the operation.   No blood products were administered during the operation.  The patient received a total of 45 mL of intravenous contrast during the procedure.  LVEDP: 12 mmHg  Lonni Cash MD,  FACC 06/20/2024 1:23 PM       "

## 2024-06-20 NOTE — Progress Notes (Signed)
" °  Echocardiogram 2D Echocardiogram has been performed.  Emily [name] 06/20/2024, 1:27 PM "

## 2024-06-20 NOTE — Op Note (Signed)
 " [facility]ENTER   MULTIDISCIPLINARY HEART VALVE TEAM   TAVR OPERATIVE NOTE   Date of Procedure:  06/20/2024  Preoperative Diagnosis: Severe Aortic Stenosis   Postoperative Diagnosis: Same   Procedure:   Transcatheter Aortic Valve Replacement - Percutaneous Right Transfemoral Approach  Edwards Sapien 3 Ultra Resilia THV (size 23 mm, model # 9755RSL, serial # [id number])   Co-Surgeons:  Dorise LOIS Fellers, MD and Lonni Cash, MD   Anesthesiologist:  Maryclare, MD  Echocardiographer:  Croitoru, MD  Pre-operative Echo Findings: Severe aortic stenosis Normal left ventricular systolic function  Post-operative Echo Findings: No paravalvular leak Normal left ventricular systolic function   BRIEF CLINICAL NOTE AND INDICATIONS FOR SURGERY  This 74 year old woman has stage D severe, symptomatic, low flow/low gradient severe aortic stenosis with NYHA class 2 symptoms of exertional fatigue and shortness of breath consistent with diastolic congestive heart failure. I have personally reviewed her 2D echo, cardiac cath and CTA studies. Her echo shows a severely calcified aortic valve with restricted mobility. The mean gradient was 28 mm Hg with an AVA of 0.61 and DI of 0.23. SVI is low at 23. LVEF is mildly decreased at 45-50%. Her cath showed high grade RCA stenosis that was successfully treated with PCI by Dr. Cash on 05/17/24. She was initially seen by Dr. Daniel for consideration of open surgical AVR and CABG but CTA of the chest showed diffuse aortic calcification including the ascending aorta and she was therefore not felt to be a candidate for open surgery. I agree that TAVR is the best option for treating her. Her gated cardiac CTA shows anatomy suitable for a 23 mm Sapien 3 valve. Her abdominal and pelvic CTA shows adequate pelvic vascular anatomy for transfemoral insertion.    The patient was counseled at length regarding treatment alternatives for management of severe  symptomatic aortic stenosis. The risks and benefits of surgical intervention has been discussed in detail. Long-term prognosis with medical therapy was discussed. Alternative approaches such as conventional surgical aortic valve replacement, transcatheter aortic valve replacement, and palliative medical therapy were compared and contrasted at length. This discussion was placed in the context of the patient's own specific clinical presentation and past medical history. All of her questions have been addressed.    Following the decision to proceed with transcatheter aortic valve replacement, a discussion was held regarding what types of management strategies would be attempted intraoperatively in the event of life-threatening complications, including whether or not the patient would be considered a candidate for the use of cardiopulmonary bypass and/or conversion to open sternotomy for attempted surgical intervention. Given her diffusely calcified ascending aorta I don't think she is a candidate for emergent sternotomy to manage any intra-op complications. The patient has been advised of a variety of complications that might develop including but not limited to risks of death, stroke, paravalvular leak, aortic dissection or other major vascular complications, aortic annulus rupture, device embolization, cardiac rupture or perforation, mitral regurgitation, acute myocardial infarction, arrhythmia, heart block or bradycardia requiring permanent pacemaker placement, congestive heart failure, respiratory failure, renal failure, pneumonia, infection, other late complications related to structural valve deterioration or migration, or other complications that might ultimately cause a temporary or permanent loss of functional independence or other long term morbidity. The patient provides full informed consent for the procedure as described and all questions were answered.       DETAILS OF THE OPERATIVE  PROCEDURE  PREPARATION:    The patient was brought to  the operating room on the above mentioned date and appropriate monitoring was established by the anesthesia team. The patient was placed in the supine position on the operating table.  Intravenous antibiotics were administered. The patient was monitored closely throughout the procedure under conscious sedation.    Baseline transthoracic echocardiogram was performed. The patient's abdomen and both groins were prepped and draped in a sterile manner. A time out procedure was performed.   PERIPHERAL ACCESS:    Using the modified Seldinger technique and US  guidance a 6 Fr sheath was placed in the right internal jugular vein. A temporary transvenous pacemaker catheter was passed through the venous sheath under fluoroscopic guidance into the right ventricle.  The pacemaker was tested to ensure stable lead placement and pacemaker capture.   Then femoral arterial access was obtained with placement of a 6 Fr sheath on the left side.  A pigtail diagnostic catheter was passed through the left arterial sheath under fluoroscopic guidance into the aortic root.   Aortic root angiography was performed in order to determine the optimal angiographic angle for valve deployment.   TRANSFEMORAL ACCESS:   Percutaneous transfemoral access and sheath placement was performed using ultrasound guidance.  The right common femoral artery was cannulated using a micropuncture needle and appropriate location was verified using hand injection angiogram.  A pair of Abbott Perclose percutaneous closure devices were placed and a 6 French sheath replaced into the femoral artery.  The patient was heparinized systemically and ACT verified > 250 seconds.    A 14 Fr transfemoral E-sheath was introduced into the right common femoral artery after progressively dilating over an Amplatz superstiff wire. An AL-1 catheter was used to direct a straight-tip exchange length wire across the  native aortic valve into the left ventricle. This was exchanged out for a pigtail catheter and position was confirmed in the LV apex. Simultaneous LV and Ao pressures were recorded.  The pigtail catheter was exchanged for a Safari wire in the LV apex.   BALLOON AORTIC VALVULOPLASTY:   Not performed   TRANSCATHETER HEART VALVE DEPLOYMENT:   An Edwards Sapien 3 Ultra transcatheter heart valve (size 23 mm) was prepared and crimped per manufacturer's guidelines, and the proper orientation of the valve is confirmed on the [employer] delivery system. The valve was advanced through the introducer sheath using normal technique until in an appropriate position in the abdominal aorta beyond the sheath tip. The balloon was then retracted and using the fine-tuning wheel was centered on the valve. The valve was then advanced across the aortic arch using appropriate flexion of the catheter. The valve was carefully positioned across the aortic valve annulus. The Commander catheter was retracted using normal technique. Once final position of the valve has been confirmed by angiographic assessment, the valve is deployed during rapid ventricular pacing to maintain systolic blood pressure < 50 mmHg and pulse pressure < 10 mmHg. The balloon inflation is held for >3 seconds after reaching full deployment volume. Once the balloon has fully deflated the balloon is retracted into the ascending aorta and valve function is assessed using echocardiography. There is felt to be no paravalvular leak and no central aortic insufficiency.  The patient's hemodynamic recovery following valve deployment is good.  The deployment balloon and guidewire are both removed. She developed some bradycardia and transient CHB after valve insertion so we decided to keep the temporary pacer in place overnight on backup.   PROCEDURE COMPLETION:   The sheath was removed and femoral artery closure  performed.  Protamine  was administered once  femoral arterial repair was complete. The  pigtail catheter and femoral sheath were removed.  A Mynx femoral closure device was utilized following removal of the diagnostic sheath in the left femoral artery.  The patient tolerated the procedure well and is transported to the cath lab recovery area in stable condition. There were no immediate intraoperative complications. All sponge instrument and needle counts are verified correct at completion of the operation.   No blood products were administered during the operation.  The patient received a total of 45 mL of intravenous contrast during the procedure.   Dorise MARLA Fellers, MD 06/20/2024       "

## 2024-06-20 NOTE — Discharge Instructions (Signed)

## 2024-06-20 NOTE — Transfer of Care (Signed)
 Immediate Anesthesia Transfer of Care Note  Patient: Emily [name]  Procedure(s) Performed: Transcatheter Aortic Valve Replacement, Transfemoral (Left) ECHOCARDIOGRAM, TRANSTHORACIC  Patient Location: Cath Lab  Anesthesia Type:MAC  Level of Consciousness: awake, alert , and oriented  Airway & Oxygen Therapy: Patient Spontanous Breathing  Post-op Assessment: Report given to RN and Post -op Vital signs reviewed and stable  Post vital signs: Reviewed and stable  Last Vitals:  Vitals Value Taken Time  BP 100/40 06/20/24 13:45  Temp 37 C 06/20/24 13:20  Pulse 53 06/20/24 13:54  Resp 13 06/20/24 13:54  SpO2 97 % 06/20/24 13:54  Vitals shown include unfiled device data.  Last Pain:  Vitals:   06/20/24 1320  TempSrc: Oral  PainSc: 0-No pain      Patients Stated Pain Goal: 0 (06/20/24 0925)  Complications: There were no known notable events for this encounter.

## 2024-06-20 NOTE — Progress Notes (Signed)
" °  [facility] AND VASCULAR CENTER   MULTIDISCIPLINARY HEART VALVE TEAM  Patient doing well s/p TAVR. She is hemodynamically stable. Groin sites stable. ECG with no high grade block, but underlying RBBB and a couple paced beats on tele. Pacer turned down from 50--> 30 bpm back up. HRs in 50s. BP elevated- will add PRN hydralazine . Keep right internal jugular pacer in place until tomorrow.    Lamarr Hummer PA-C  [facility]  Pager [phone]  "

## 2024-06-21 ENCOUNTER — Encounter (HOSPITAL_COMMUNITY): Payer: Self-pay | Admitting: Cardiovascular Disease

## 2024-06-21 ENCOUNTER — Inpatient Hospital Stay (HOSPITAL_COMMUNITY)

## 2024-06-21 DIAGNOSIS — I5022 Chronic systolic (congestive) heart failure: Secondary | ICD-10-CM | POA: Diagnosis not present

## 2024-06-21 DIAGNOSIS — I350 Nonrheumatic aortic (valve) stenosis: Secondary | ICD-10-CM

## 2024-06-21 DIAGNOSIS — I441 Atrioventricular block, second degree: Secondary | ICD-10-CM

## 2024-06-21 DIAGNOSIS — I080 Rheumatic disorders of both mitral and aortic valves: Secondary | ICD-10-CM | POA: Diagnosis not present

## 2024-06-21 DIAGNOSIS — I429 Cardiomyopathy, unspecified: Secondary | ICD-10-CM | POA: Diagnosis not present

## 2024-06-21 DIAGNOSIS — I4510 Unspecified right bundle-branch block: Secondary | ICD-10-CM

## 2024-06-21 DIAGNOSIS — Z952 Presence of prosthetic heart valve: Secondary | ICD-10-CM

## 2024-06-21 LAB — BASIC METABOLIC PANEL WITH GFR
Anion gap: 9 (ref 5–15)
BUN: 25 mg/dL — ABNORMAL HIGH (ref 8–23)
CO2: 23 mmol/L (ref 22–32)
Calcium: 8.2 mg/dL — ABNORMAL LOW (ref 8.9–10.3)
Chloride: 102 mmol/L (ref 98–111)
Creatinine, Ser: 1 mg/dL (ref 0.44–1.00)
GFR, Estimated: 59 mL/min — ABNORMAL LOW
Glucose, Bld: 160 mg/dL — ABNORMAL HIGH (ref 70–99)
Potassium: 4.8 mmol/L (ref 3.5–5.1)
Sodium: 134 mmol/L — ABNORMAL LOW (ref 135–145)

## 2024-06-21 LAB — ECHOCARDIOGRAM COMPLETE
AV Mean grad: 10 mmHg
AV Peak grad: 16.6 mmHg
Ao pk vel: 2.04 m/s
Area-P 1/2: 2.69 cm2
Calc EF: 67.8 %
Height: 62.5 in
S' Lateral: 3 cm
Single Plane A2C EF: 69.1 %
Single Plane A4C EF: 65.7 %
Weight: 2944 [oz_av]

## 2024-06-21 LAB — CBC
HCT: 25 % — ABNORMAL LOW (ref 36.0–46.0)
Hemoglobin: 8.3 g/dL — ABNORMAL LOW (ref 12.0–15.0)
MCH: 32 pg (ref 26.0–34.0)
MCHC: 33.2 g/dL (ref 30.0–36.0)
MCV: 96.5 fL (ref 80.0–100.0)
Platelets: 163 K/uL (ref 150–400)
RBC: 2.59 MIL/uL — ABNORMAL LOW (ref 3.87–5.11)
RDW: 17.7 % — ABNORMAL HIGH (ref 11.5–15.5)
WBC: 6.5 K/uL (ref 4.0–10.5)
nRBC: 0 % (ref 0.0–0.2)

## 2024-06-21 LAB — GLUCOSE, CAPILLARY
Glucose-Capillary: 157 mg/dL — ABNORMAL HIGH (ref 70–99)
Glucose-Capillary: 235 mg/dL — ABNORMAL HIGH (ref 70–99)
Glucose-Capillary: 111 mg/dL — ABNORMAL HIGH (ref 70–99)
Glucose-Capillary: 196 mg/dL — ABNORMAL HIGH (ref 70–99)

## 2024-06-21 LAB — MAGNESIUM: Magnesium: 2.1 mg/dL (ref 1.7–2.4)

## 2024-06-21 MED ORDER — LOSARTAN POTASSIUM 25 MG PO TABS: 25.0000 mg | ORAL_TABLET | Freq: Every day | ORAL | Status: DC

## 2024-06-21 MED ORDER — METOPROLOL SUCCINATE ER 25 MG PO TB24
25.0000 mg | ORAL_TABLET | Freq: Every day | ORAL | Status: DC
  Administered 2024-06-21 – 2024-06-22 (×2): 25 mg via ORAL
  Filled 2024-06-21 (×2): qty 1

## 2024-06-21 MED ORDER — CHLORHEXIDINE GLUCONATE CLOTH 2 % EX PADS
6.0000 | MEDICATED_PAD | Freq: Every day | CUTANEOUS | Status: DC
  Administered 2024-06-21 – 2024-06-22 (×2): 6 via TOPICAL

## 2024-06-21 MED ORDER — LOSARTAN POTASSIUM 25 MG PO TABS
25.0000 mg | ORAL_TABLET | Freq: Every day | ORAL | Status: DC
  Administered 2024-06-21 – 2024-06-22 (×2): 25 mg via ORAL
  Filled 2024-06-21 (×2): qty 1

## 2024-06-21 MED ORDER — FUROSEMIDE 40 MG PO TABS
40.0000 mg | ORAL_TABLET | Freq: Every day | ORAL | Status: DC
  Administered 2024-06-21 – 2024-06-22 (×2): 40 mg via ORAL
  Filled 2024-06-21 (×2): qty 1

## 2024-06-21 MED ORDER — SPIRONOLACTONE 25 MG PO TABS
25.0000 mg | ORAL_TABLET | Freq: Every day | ORAL | Status: DC
  Administered 2024-06-21 – 2024-06-22 (×2): 25 mg via ORAL
  Filled 2024-06-21 (×2): qty 1

## 2024-06-21 MED FILL — Fentanyl Citrate Preservative Free (PF) Inj 100 MCG/2ML: INTRAMUSCULAR | Qty: 2 | Status: AC

## 2024-06-21 NOTE — TOC Initial Note (Signed)
 Transition of Care [facility]) - Initial/Assessment Note    Patient Details  Name: Emily [name] MRN: [id number] Date of Birth: 1951/03/09  Transition of Care [facility]) CM/SW Contact:    Marval Gell, RN Phone Number: 06/21/2024, 1:36 PM  Clinical Narrative:                  Patient admitted for TAVR. OP cardiac rehab referred out to [facility] location. Observing rhythm one more night, anticipate DC tomorrow morning. No ICM needs identified.    Expected Discharge Plan: Home/Self Care Barriers to Discharge: Continued Medical Work up   Patient Goals and CMS Choice Patient states their goals for this hospitalization and ongoing recovery are:: return home          Expected Discharge Plan and Services   Discharge Planning Services: CM Consult Post Acute Care Choice: NA Living arrangements for the past 2 months: Single Family Home                                      Prior Living Arrangements/Services Living arrangements for the past 2 months: Single Family Home                     Activities of Daily Living   ADL Screening (condition at time of admission) Independently performs ADLs?: Yes (appropriate for developmental age) Is the patient deaf or have difficulty hearing?: No Does the patient have difficulty seeing, even when wearing glasses/contacts?: No Does the patient have difficulty concentrating, remembering, or making decisions?: No  Permission Sought/Granted                  Emotional Assessment              Admission diagnosis:  Aortic stenosis, severe [I35.0] S/P TAVR (transcatheter aortic valve replacement) [Z95.2] Patient Active Problem List   Diagnosis Date Noted   S/P TAVR (transcatheter aortic valve replacement) 06/20/2024   Unstable angina (HCC) 05/17/2024   Coronary artery disease 04/24/2024   Statin myopathy 04/18/2024   Bradycardia 01/20/2024   Mild mitral stenosis 05/06/2023   History of GI bleed 05/06/2023   Severe aortic  stenosis 05/06/2023   Paroxysmal atrial fibrillation (HCC) 05/06/2023   Nonrheumatic aortic valve stenosis 03/19/2020   OSA on CPAP 09/25/2019   History of adenomatous polyp of colon 08/30/2019   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 device 07/16/2018   Restless leg syndrome 02/16/2018   Morbid obesity (HCC) 02/14/2018   DDD (degenerative disc disease), lumbar 12/07/2017   Peripheral arterial disease 07/06/2017   Hyperlipidemia associated with type 2 diabetes mellitus (HCC) 06/29/2017   Iron  deficiency anemia 05/27/2017   Vitamin B12 deficiency 05/27/2017   Pain in joint, ankle and foot 06/03/2016   Bilateral carotid artery disease 07/05/2015   Multiple gastric polyps 07/03/2014   Diabetes mellitus treated with injections of non-insulin  medication (HCC)    Hypertension associated with diabetes (HCC) 10/27/2013   Hypothyroidism due to acquired atrophy of thyroid  10/27/2013   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HCC) 02/14/2007   PCP:  Jolinda Norene HERO, DO Pharmacy:   [facility] [phone] - [place], [place] - [address] [place] [address] Phone: [phone] Fax: [phone]     Social Drivers of Health (SDOH) Social History: SDOH Screenings   Food Insecurity: No Food Insecurity (05/22/2024)  Housing: Unknown (05/22/2024)  Transportation Needs: No Transportation Needs (05/22/2024)  Utilities: Not  At Risk (05/22/2024)  Depression (PHQ2-9): Low Risk (05/30/2024)  Financial Resource Strain: Low Risk (04/21/2022)  Physical Activity: Inactive (04/21/2022)  Social Connections: Moderately Integrated (05/17/2024)  Stress: No Stress Concern Present (04/21/2022)  Tobacco Use: Low Risk (06/20/2024)   SDOH Interventions:     Readmission Risk Interventions    04/18/2024   10:23 AM  Readmission Risk Prevention Plan  Transportation Screening Complete  HRI or Home Care Consult Complete  Social Work Consult for Recovery Care Planning/Counseling Complete   Palliative Care Screening Not Applicable  Medication Review Oceanographer) Complete

## 2024-06-21 NOTE — Progress Notes (Signed)
" °  Echocardiogram 2D Echocardiogram has been performed.  Emily [name] 06/21/2024, 1:42 PM "

## 2024-06-21 NOTE — Progress Notes (Signed)
" °  HEART AND VASCULAR CENTER   MULTIDISCIPLINARY HEART VALVE TEAM   Dr. Inocencio and Charlies Martes PA-C reviewed the patients chart and tele and felt like she was having blocked PACs.They recommended resuming home BB and watching for one more day and then discharge home with live telemetry.   Lamarr Hummer PA-C  [facility]   "

## 2024-06-21 NOTE — Progress Notes (Signed)
 CARDIAC REHAB PHASE I   Post TAVR education including site care, restrictions, risk factors, heart healthy diabetic diet, exercise guidelines, and CRP2 reviewed. All questions and concerns addressed. Patient prefers location closer to [place]. Will refer to [facility] for CRP2  [id number] Isaiah JAYSON [place], RN BSN 06/21/2024 12:18 PM

## 2024-06-21 NOTE — Progress Notes (Addendum)
 " HEART AND VASCULAR CENTER   MULTIDISCIPLINARY HEART VALVE TEAM  Patient Name: Emily [name] Date of Encounter: 06/21/2024  Admit date: 06/20/2024   PCP:  Jolinda Norene HERO, DO  [facility] Cardiologist:  Lynwood Schilling, MD  [facility] Structural heart: Lonni Cash, MD [facility] Electrophysiologist:  Will Gladis Norton, MD   Hospital Problem List     Principal Problem:   S/P TAVR (transcatheter aortic valve replacement) Active Problems:   Hypothyroidism due to acquired atrophy of thyroid    Diabetes mellitus treated with injections of non-insulin  medication (HCC)   Iron  deficiency anemia   Peripheral arterial disease   Morbid obesity (HCC)   Nonrheumatic aortic valve stenosis   OSA on CPAP   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 device   History of GI bleed   Severe aortic stenosis   Paroxysmal atrial fibrillation (HCC)   Coronary artery disease   Subjective   No complaints. Sleepy.   Inpatient Medications    Scheduled Meds:  amiodarone   200 mg Oral Daily   aspirin  EC  81 mg Oral Daily   Chlorhexidine  Gluconate Cloth  6 each Topical Daily   clopidogrel   75 mg Oral Daily   ezetimibe   10 mg Oral Daily   furosemide   40 mg Oral Daily   gabapentin   300 mg Oral BID   insulin  aspart  0-24 Units Subcutaneous TID AC & HS   levothyroxine   112 mcg Oral Q0600   rOPINIRole   0.5 mg Oral QHS   rosuvastatin   20 mg Oral Daily   sodium chloride  flush  3 mL Intravenous Q12H   spironolactone   25 mg Oral Daily   Continuous Infusions:  sodium chloride      nitroGLYCERIN      norepinephrine  (LEVOPHED ) Adult infusion     PRN Meds: sodium chloride , acetaminophen  **OR** acetaminophen , diphenhydrAMINE , morphine  injection, ondansetron  (ZOFRAN ) IV, oxyCODONE , sodium chloride  flush, traMADol    Vital Signs    Vitals:   06/21/24 0500 06/21/24 0600 06/21/24 0700 06/21/24 0836  BP: (!) 105/39 (!) 116/46 (!) 117/98   Pulse: 62 63 70   Resp: 12 16  16    Temp:    98.9 F (37.2 C)  TempSrc:    Oral  SpO2: 92% 94% 96%   Weight:      Height:        Intake/Output Summary (Last 24 hours) at 06/21/2024 0923 Last data filed at 06/21/2024 0600 Gross per 24 hour  Intake 2106.08 ml  Output 600 ml  Net 1506.08 ml   Filed Weights   06/20/24 0906  Weight: 83.5 kg    Physical Exam    GEN: Well nourished, well developed, in no acute distress. obese HEENT: Grossly normal.  Neck: Supple, no JVD or masses. Cardiac: RRR, 1/6 SEM @ RUSB. No rubs, or gallops. No clubbing, cyanosis, edema.   Respiratory:  Respirations regular and unlabored, clear to auscultation bilaterally. GI: Soft, nontender, nondistended, BS + x 4. MS: no deformity or atrophy. Skin: warm and dry, no rash. Groin sites clear without hematoma or ecchymosis  Neuro:  Strength and sensation are intact. Psych: AAOx3.  Normal affect.  Labs    CBC Recent Labs    06/20/24 1258 06/21/24 0231  WBC  --  6.5  HGB 6.5* 8.3*  HCT 19.0* 25.0*  MCV  --  96.5  PLT  --  163   Basic Metabolic Panel: Recent Labs  Lab 06/16/24 1025 06/20/24 1258 06/21/24 0231  NA 135 139 134*  K 4.5 4.4 4.8  CL 97* 102 102  CO2 30  --  23  GLUCOSE 147* 158* 160*  BUN 27* 22 25*  CREATININE 1.46* 1.00 1.00  CALCIUM  9.6  --  8.2*  MG  --   --  2.1    Recent Labs  Lab 06/16/24 1025 06/21/24 0231  WBC 7.2 6.5    Telemetry    Sinus with frequent missed beats - possibly Mobitz type II - Personally Reviewed  ECG    No new tracing today -will ask RN to obtain - Personally Reviewed  Patient Profile     Emily [name] is a 74 y.o. female with a history of PAF s/p Watchman, GI bleeds, DMT2 with peripheral neuropathy, HTN, HLD, carotid artery disease s/p L CEA (2017) and R TCAR (03/2023) with 50-75% stenosis in stented RICA, HFpEF, severe asymmetric LVH (cMRI felt to be 2/2 AS), severe 3V CAD s/p PTCA/DES RCA 05/17/24 and severe LFLG AS who presented to [facility] on 06/20/24 for planned  TAVR.   Assessment & Plan    Severe AS:  -- S/p TAVR with a 23 mm Edwards Sapien 3 Ultra Resilia THV via the TF approach on 06/20/24.  -- Echo today.  -- Groin sites are stable.  -- Continue Asprin 81mg  daily and Plavix  75mg  daily.   Bifascicular block with transient CHB: -- Underlying RBBB/LAFB. -- Transient CHB after TAVR. -- RIJ pacer left in place and monitored in the ICU.  -- Having some missed beats- ? Mobitz type II. -- No pacing requirements.  -- Will have EP consult.  PAF:  -- Maintaining sinus. -- Continue amiodarone  200mg  daily. -- Toprol  XL 25mg  daily on hold currently.  -- Watchman in place.   CAD with angina:  -- s/p PTCA/DES RCA 05/17/24  -- Continue ASA, Plavix , Zetia  and Pravachol .    HFpEF:  -- LVEDP 12 mm hg at the time of TAVR.  -- Appears euvolemic . -- Continue Lasix  40mg  daily, spironolactone  25mg  daily and losartan  25mg  daily.  HTN: -- BP stable.  -- Continue Lasix  40mg  daily, spironolactone  25mg  daily and losartan  25mg  daily.  PAD: -- Pre TAVR CTs showed heavily calcified plaque in the right common femoral artery causing a short segment stenosis of mild to moderate severity. Heavily calcified plaque in the proximal superficial femoral arteries bilaterally. Correlate with ABIs.  -- LE extremity dopplers 04/25/24 showed moderate bilateral LE PAD. -- Continue medical therapy.  -- Follows with [facility].   Carotid artery disease: --L CEA (2017) and R TCAR (03/2023) with 50-75% stenosis in stented RICA. -- Continue medical therapy.  -- Follows with [facility].  Signed, Lamarr Hummer, PA-C  06/21/2024, 9:23 AM  Pager [phone]  I have personally seen and examined this patient. I agree with the assessment and plan as outlined above.  She is doing well today post TAVR.  Groins stable.  Tele reviewed by me: sinus with pauses, ? Mobitz 2 block. Temp pacer in place but not pacing.  My exam: NAD  RRR  Lungs clear bilat  Abd: soft  Ext: no LE edema  Groins  without hematoma Labs reviewed by me Plan: POD 1 following TAVR from the TF approach. STable today. EP consult to review her rhythm. Will remove temp wire after EP consult. Continue ASA and Plavix .   Lonni Cash, MD, [facility] 06/21/2024 10:29 AM   "

## 2024-06-22 ENCOUNTER — Inpatient Hospital Stay (HOSPITAL_COMMUNITY)
Admission: RE | Admit: 2024-06-22 | Discharge: 2024-06-22 | Disposition: A | Discharge status: Home / Self Care | Source: Home / Self Care | Attending: Physician Assistant | Admitting: Physician Assistant

## 2024-06-22 DIAGNOSIS — I2510 Atherosclerotic heart disease of native coronary artery without angina pectoris: Secondary | ICD-10-CM

## 2024-06-22 DIAGNOSIS — Z952 Presence of prosthetic heart valve: Secondary | ICD-10-CM

## 2024-06-22 DIAGNOSIS — I442 Atrioventricular block, complete: Secondary | ICD-10-CM

## 2024-06-22 LAB — CBC
HCT: 23.4 % — ABNORMAL LOW (ref 36.0–46.0)
Hemoglobin: 7.8 g/dL — ABNORMAL LOW (ref 12.0–15.0)
MCH: 32.5 pg (ref 26.0–34.0)
MCHC: 33.3 g/dL (ref 30.0–36.0)
MCV: 97.5 fL (ref 80.0–100.0)
Platelets: 143 K/uL — ABNORMAL LOW (ref 150–400)
RBC: 2.4 MIL/uL — ABNORMAL LOW (ref 3.87–5.11)
RDW: 18.3 % — ABNORMAL HIGH (ref 11.5–15.5)
WBC: 8.2 K/uL (ref 4.0–10.5)
nRBC: 0 % (ref 0.0–0.2)

## 2024-06-22 LAB — BASIC METABOLIC PANEL WITH GFR
Anion gap: 10 (ref 5–15)
BUN: 27 mg/dL — ABNORMAL HIGH (ref 8–23)
CO2: 25 mmol/L (ref 22–32)
Calcium: 8.4 mg/dL — ABNORMAL LOW (ref 8.9–10.3)
Chloride: 103 mmol/L (ref 98–111)
Creatinine, Ser: 1.22 mg/dL — ABNORMAL HIGH (ref 0.44–1.00)
GFR, Estimated: 46 mL/min — ABNORMAL LOW
Glucose, Bld: 121 mg/dL — ABNORMAL HIGH (ref 70–99)
Potassium: 4.3 mmol/L (ref 3.5–5.1)
Sodium: 137 mmol/L (ref 135–145)

## 2024-06-22 LAB — GLUCOSE, CAPILLARY: Glucose-Capillary: 119 mg/dL — ABNORMAL HIGH (ref 70–99)

## 2024-06-22 NOTE — Progress Notes (Unsigned)
 Patient ID: Emily [name]                 DOB: 02-Sep-1950                    MRN: [id number]      HPI: Emily [name] is a 74 y.o. female patient referred to lipid clinic by Dr. Anner. 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   Most recent lipid panel on 03/14/24 showed a TC 148, TG 114, and LDL-C of 88.   Last seen by Dr. Anner inpatient (04/2024), where the patient was hospitalized for AFib with RVR. She was stabilized on amiodarone  + metoprolol  and discharged. Pharmacy referral was placed during hospitalization for outpatient follow-up.   At today's visit, ***  Needs in-depth med review, duplicates everywhere Recent TAVR on 06/20/24 Tolerating rosuvastatin ? Picked up recently ~38% reduction needed, only PCSK9i as an option (can consider bempedoic acid, if she will not take injectables), was on ezetimibe  at time of lipid panel (no statin, as I can see via fill hx of multiple statin) - likely due to SEs  Reviewed options for lowering LDL cholesterol, including ezetimibe , PCSK-9 inhibitors, bempedoic acid and inclisiran.  Discussed mechanisms of action, dosing, side effects and potential decreases in LDL cholesterol.  Also reviewed cost information and potential options for patient assistance.   Current Medications:  Rosuvastatin  20 mg daily Ezetimibe  10 mg daily Intolerances:  Myalgia and cough to 'some statins' - previously tried atorvastatin 80 mg daily, lovastatin  20-40 mg daily, pravastatin  40 mg daily Risk Factors: >65YO, PAD, HFmrEF, HTN, T2DM, CAD s/p PCI LDL-C goal: <55 mg/dL  Diet: ***  Exercise: ***  Family History:  Family History  Problem Relation Age of Onset   COPD Father    Heart failure Father    Heart disease Father    Emphysema Father    Arrhythmia Sister    Arrhythmia Sister     Arrhythmia Sister        had PPM also   Cancer Sister    Social History:  Tobacco: never *** EtOH: ***  Labs: Lipid Panel     Component Value Date/Time   CHOL 148 03/14/2024 1104   TRIG 114 03/14/2024 1104   HDL 39 (L) 03/14/2024 1104   CHOLHDL 3.8 03/14/2024 1104   LDLCALC 88 03/14/2024 1104   LDLDIRECT 89 06/03/2021 1324   LABVLDL 21 03/14/2024 1104   Past Medical History:  Diagnosis Date   Anemia    Arthritis    Bilateral carotid artery disease    CAD (coronary artery disease)    Complication of anesthesia    difficult intubation in past   Diabetes mellitus without complication (HCC)    type 2   Difficult intubation    states 'lady that did the sleep study told me I have the smallest airway she has ever seen in an adult   Dyslipidemia 06/29/2017   HFrEF (heart failure with reduced ejection fraction) (HCC)    History of blood transfusion 09/2019   GI bleed - in CE   Hypertension    Hypothyroid    Obesity    Obstructive sleep apnea    occasional uses CPAP   Peripheral vascular disease    Persistent atrial fibrillation (HCC) 01/16/2020   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 Verlin and Dr. Lucas   Severe aortic stenosis    Syncope and collapse 05/19/2024   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 - xrays were negative   Upper GI bleed 09/25/2019   Medications Ordered Prior to Encounter[1]  Allergies[2]  Assessment/Plan:  1. Hyperlipidemia -  No problem-specific Assessment & Plan notes found for this encounter.    Thank you, ***  Melissa D Maccia, Pharm.Emily [name], CPP [facility] [address], [place], [place] [address]  Phone: [phone]; Fax: [phone]      [1]  Current Facility-Administered Medications on File Prior to Visit  Medication Dose Route  Frequency Provider Last Rate Last Admin   0.9 %  sodium chloride  infusion  250 mL Intravenous PRN Thompson, Kathryn R, PA-C       acetaminophen  (TYLENOL ) tablet 650 mg  650 mg Oral Q6H PRN Thompson, Kathryn R, PA-C   650 mg at 06/21/24 9364   Or   acetaminophen  (TYLENOL ) suppository 650 mg  650 mg Rectal Q6H PRN Thompson, Kathryn R, PA-C       amiodarone  (PACERONE ) tablet 200 mg  200 mg Oral Daily Thompson, Kathryn R, PA-C   200 mg at 06/22/24 0901   aspirin  EC tablet 81 mg  81 mg Oral Daily Thompson, Kathryn R, PA-C   81 mg at 06/22/24 0900   Chlorhexidine  Gluconate Cloth 2 % PADS 6 each  6 each Topical Daily Verlin Lonni JONETTA, MD   6 each at 06/21/24 1011   clopidogrel  (PLAVIX ) tablet 75 mg  75 mg Oral Daily Thompson, Kathryn R, PA-C   75 mg at 06/22/24 0900   diphenhydrAMINE  (BENADRYL ) capsule 25 mg  25 mg Oral QHS PRN Verlin Lonni JONETTA, MD       ezetimibe  (ZETIA ) tablet 10 mg  10 mg Oral Daily Sebastian Lamarr SAUNDERS, PA-C   10 mg at 06/22/24 0901   furosemide  (LASIX ) tablet 40 mg  40 mg Oral Daily Thompson, Kathryn R, PA-C   40 mg at 06/22/24 0900   gabapentin  (NEURONTIN ) capsule 300 mg  300 mg Oral BID Thompson, Kathryn R, PA-C   300 mg at 06/22/24 0901   insulin  aspart (novoLOG ) injection 0-24 Units  0-24 Units Subcutaneous TID AC & HS Thompson, Kathryn R, PA-C   4 Units at 06/21/24 2158   levothyroxine  (SYNTHROID ) tablet 112 mcg  112 mcg Oral Q0600 Thompson, Kathryn R, PA-C   112 mcg at 06/22/24 9386   losartan  (COZAAR ) tablet 25 mg  25 mg Oral Daily Sebastian Lamarr SAUNDERS, PA-C   25 mg at 06/22/24 0900   metoprolol  succinate (TOPROL -XL) 24 hr tablet 25 mg  25 mg Oral Daily Thompson, Kathryn R, PA-C   25 mg at 06/22/24 0900   morphine  (PF) 2 MG/ML injection 1-4 mg  1-4 mg Intravenous Q1H PRN Thompson, Kathryn R, PA-C       ondansetron  (ZOFRAN ) injection 4 mg  4 mg Intravenous Q6H PRN Sebastian Lamarr SAUNDERS, PA-C       oxyCODONE  (Oxy IR/ROXICODONE ) immediate release tablet 5-10 mg  5-10  mg Oral Q3H PRN Thompson, Kathryn R, PA-C       rOPINIRole  (REQUIP ) tablet 0.5 mg  0.5 mg Oral QHS Thompson, Kathryn R, PA-C   0.5 mg at 06/21/24 2159   rosuvastatin  (CRESTOR ) tablet 20 mg  20 mg Oral  Daily Thompson, Kathryn R, PA-C   20 mg at 06/22/24 0900   sodium chloride  flush (NS) 0.9 % injection 3 mL  3 mL Intravenous Q12H Sebastian Lamarr SAUNDERS, PA-C   3 mL at 06/22/24 9092   sodium chloride  flush (NS) 0.9 % injection 3 mL  3 mL Intravenous PRN Thompson, Kathryn R, PA-C       spironolactone  (ALDACTONE ) tablet 25 mg  25 mg Oral Daily Thompson, Kathryn R, PA-C   25 mg at 06/22/24 0901   traMADol  (ULTRAM ) tablet 50-100 mg  50-100 mg Oral Q4H PRN Sebastian Lamarr SAUNDERS, PA-C       Current Outpatient Medications on File Prior to Visit  Medication Sig Dispense Refill   amiodarone  (PACERONE ) 200 MG tablet Take 1 tablet (200 mg total) by mouth daily.     aspirin  EC 81 MG tablet Take 1 tablet (81 mg total) by mouth daily. Swallow whole.     clopidogrel  (PLAVIX ) 75 MG tablet Take 1 tablet (75 mg total) by mouth daily. 90 tablet 2   diphenhydramine -acetaminophen  (TYLENOL  PM) 25-500 MG TABS tablet Take 1 tablet by mouth at bedtime as needed (pain).     ezetimibe  (ZETIA ) 10 MG tablet TAKE 1 TABLET BY MOUTH EVERY DAY 90 tablet 2   furosemide  (LASIX ) 40 MG tablet Take 1 tablet (40 mg total) by mouth daily. 90 tablet 0   gabapentin  (NEURONTIN ) 300 MG capsule Take 1 capsule (300 mg total) by mouth 2 (two) times daily. 180 capsule 3   levonorgestrel (MIRENA) 20 MCG/DAY IUD 1 each by Intrauterine route once.     levothyroxine  (SYNTHROID ) 112 MCG tablet TAKE 1 TABLET BY MOUTH EVERY OTHER DAY, ALTERNATING WITH 1 & 1/2 TABLETS EVERY OTHER DAY 112 tablet 3   losartan  (COZAAR ) 25 MG tablet Take 1 tablet (25 mg total) by mouth daily. 100 tablet 3   metFORMIN  (GLUCOPHAGE ) 1000 MG tablet Take 1 tablet (1,000 mg total) by mouth 2 (two) times daily with a meal. 180 tablet 3   metoprolol  succinate (TOPROL -XL) 25 MG 24 hr  tablet Take 1 tablet (25 mg total) by mouth at bedtime. (Patient taking differently: Take 25 mg by mouth daily. Takes in the am) 30 tablet 2   polyethylene glycol (MIRALAX  / GLYCOLAX ) 17 g packet Take 17 g by mouth daily as needed for moderate constipation.     rOPINIRole  (REQUIP ) 0.5 MG tablet Take 1 tablet (0.5 mg total) by mouth at bedtime. 100 tablet 3   rosuvastatin  (CRESTOR ) 20 MG tablet Take 1 tablet (20 mg total) by mouth daily. 30 tablet 2   Semaglutide ,0.25 or 0.5MG /DOS, 2 MG/3ML SOPN Inject 0.5 mg into the skin once a week. 9 mL 4   spironolactone  (ALDACTONE ) 25 MG tablet Take 1 tablet (25 mg total) by mouth daily. 90 tablet 0  [2]  Allergies Allergen Reactions   Statins Other (See Comments) and Cough    Not all statins but some cause cough and pain in legs.   Jardiance  [Empagliflozin ] Other (See Comments)    Recurrent vaginitis   Quinapril Hcl Cough   Tape Other (See Comments)    Redness, please use paper tape

## 2024-06-22 NOTE — Progress Notes (Signed)
ZIO AT applied at hospital. Dr. Antoine Poche to read.

## 2024-06-23 ENCOUNTER — Telehealth: Payer: Self-pay

## 2024-06-23 NOTE — Transitions of Care (Post Inpatient/ED Visit) (Signed)
 "  06/23/2024  Name: Emily [name] MRN: [id number] DOB: June 24, 1950  Today's TOC FU Call Status: Today's TOC FU Call Status:: Successful TOC FU Call Completed TOC FU Call Complete Date: 06/23/24  Patient's Name and Date of Birth confirmed. Name, DOB  Transition Care Management Follow-up Telephone Call Date of Discharge: 06/22/24 Discharge Facility: [facility]) Type of Discharge: Inpatient Admission Primary Inpatient Discharge Diagnosis:: S/P TAVR (transcatheter aortic valve replacement) How have you been since you were released from the hospital?: Better Any questions or concerns?: No  Items Reviewed: Did you receive and understand the discharge instructions provided?: No Medications obtained,verified, and reconciled?: Yes (Medications Reviewed) Any new allergies since your discharge?: No Dietary orders reviewed?: Yes Type of Diet Ordered:: Heart Healthy Carb Modified Do you have support at home?: Yes People in Home [RPT]: alone Name of Support/Comfort Primary Source: [name]- patient staying with him for a few days.  Medications Reviewed Today: Medications Reviewed Today     Reviewed by Stellan Vick, RN (Case Manager) on 06/23/24 at 1342  Med List Status: <None>   Medication Order Taking? Sig Documenting Provider Last Dose Status Informant  amiodarone  (PACERONE ) 200 MG tablet [id number] Yes Take 1 tablet (200 mg total) by mouth daily. Goodrich, Callie E, PA-C  Active Self  aspirin  EC 81 MG tablet [id number] Yes Take 1 tablet (81 mg total) by mouth daily. Swallow whole. Verlin Lonni BIRCH, MD  Active Self  clopidogrel  (PLAVIX ) 75 MG tablet [id number] Yes Take 1 tablet (75 mg total) by mouth daily. Bethanie Cough, PA-C  Active   cyanocobalamin  ((VITAMIN B-12)) injection 1,000 mcg [id number]   Harl Jayson CROME, MD  Active   diphenhydramine -acetaminophen  (TYLENOL  PM) 25-500 MG TABS tablet [id number] Yes Take 1 tablet by mouth at bedtime as needed (pain). [provider]  Active Self  ezetimibe  (ZETIA ) 10 MG tablet [id number] Yes TAKE 1 TABLET BY MOUTH EVERY DAY Lavona Agent, MD  Active Self  furosemide  (LASIX ) 40 MG tablet [id number] Yes Take 1 tablet (40 mg total) by mouth daily. Gherghe, Costin M, MD  Active Self  gabapentin  (NEURONTIN ) 300 MG capsule [id number] Yes Take 1 capsule (300 mg total) by mouth 2 (two) times daily. Jolinda Potter M, DO  Active Self  levonorgestrel (MIRENA) 20 MCG/DAY IUD [id number] Yes 1 each by Intrauterine route once. [provider]  Active Self  levothyroxine  (SYNTHROID ) 112 MCG tablet [id number] Yes TAKE 1 TABLET BY MOUTH EVERY OTHER DAY, ALTERNATING WITH 1 & 1/2 TABLETS EVERY OTHER DAY Gottschalk, Ashly M, DO  Active Self           Med Note [facility], [facility]   Fri May 12, 2024  4:02 PM)    losartan  (COZAAR ) 25 MG tablet [id number] Yes Take 1 tablet (25 mg total) by mouth daily. Jolinda Potter M, DO  Active Self  metFORMIN  (GLUCOPHAGE ) 1000 MG tablet [id number] Yes Take 1 tablet (1,000 mg total) by mouth 2 (two) times daily with a meal. Jolinda Potter HERO, DO  Active Self           Med Note JACKOLYN WADDELL DEL   Tue Apr 04, 2024  8:43 AM)    metoprolol  succinate (TOPROL -XL) 25 MG 24 hr tablet [id number] Yes Take 1 tablet (25 mg total) by mouth at bedtime.  Patient taking differently: Take 25 mg by mouth daily. Takes in the am   Goodrich, Callie E, PA-C  Active Self  polyethylene glycol (MIRALAX  / GLYCOLAX ) 17 g packet [id number] Yes Take 17  g by mouth daily as needed for moderate constipation. [provider]  Active Self  rOPINIRole  (REQUIP ) 0.5 MG tablet [id number] Yes Take 1 tablet (0.5 mg total) by mouth at bedtime. Jolinda Potter M, DO  Active Self  rosuvastatin  (CRESTOR ) 20 MG tablet [id number] Yes Take 1 tablet (20 mg total) by mouth daily. Goodrich, Callie E, PA-C  Active Self  Semaglutide ,0.25 or 0.5MG /DOS, 2 MG/3ML SOPN [id number] Yes Inject 0.5 mg into the skin once a week. Jolinda Potter  M, DO  Active Self  spironolactone  (ALDACTONE ) 25 MG tablet [id number] Yes Take 1 tablet (25 mg total) by mouth daily. Trixie Nilda HERO, MD  Active Self            Home Care and Equipment/Supplies: Were Home Health Services Ordered?: NA Any new equipment or medical supplies ordered?: NA  Functional Questionnaire: Do you need assistance with bathing/showering or dressing?: No Do you need assistance with meal preparation?: No Do you need assistance with eating?: No Do you have difficulty maintaining continence: No Do you need assistance with getting out of bed/getting out of a chair/moving?: No Do you have difficulty managing or taking your medications?: No  Follow up appointments reviewed: PCP Follow-up appointment confirmed?: No (patient states she will call.) MD Provider Line [phone] Given: No Specialist Hospital Follow-up appointment confirmed?: Yes Date of Specialist follow-up appointment?: 06/29/24 Follow-Up Specialty Provider:: Izetta Hummer Do you need transportation to your follow-up appointment?: No Do you understand care options if your condition(s) worsen?: Yes-patient verbalized understanding  SDOH Interventions Today    Flowsheet Row Most Recent Value  SDOH Interventions   Food Insecurity Interventions Intervention Not Indicated  Housing Interventions Intervention Not Indicated  Transportation Interventions Intervention Not Indicated  Utilities Interventions Intervention Not Indicated    Ellawyn Wogan J. Kjersten Ormiston RN, MSN [facility]  [facility], [facility] RN Care Manager Direct Dial: [phone]  Fax: [phone] Website: [email or web address]   "

## 2024-06-23 NOTE — Telephone Encounter (Signed)
 Patient contacted regarding discharge from [facility] on 06/22/24  Patient understands to follow up with provider Izetta Hummer, PA-C on 06/29/24 at 3:35PM at [address] Location. Patient understands discharge instructions? Yes Patient understands medications and regiment? Yes Patient understands to bring all medications to this visit? Yes   Patient has been scheduled for a HLD follow up appt with Melissa Maccia on 06/27/24. I have canceled this appt per Izetta Hummer since it is not needed at this time.

## 2024-06-23 NOTE — Patient Instructions (Signed)
 Visit Information  Thank you for taking time to visit with me today. Please don't hesitate to contact me if I can be of assistance to you.     Incision / Access Site Care  Keep site clean and dry Showering is usually allowed after 24-48 hours No soaking (bathtub, pool, hot tub) for at least 7 days Watch for signs of infection: Redness, warmth, swelling, drainage Fever over 100.69F (38C)  Call your provider immediately if you notice:  Bleeding that doesnt stop with pressure Increasing pain or swelling at the site A new lump or pulsating mass in the groin Numbness or coolness in the leg    The patient verbalized understanding of instructions, educational materials, and care plan provided today and DECLINED offer to receive copy of patient instructions, educational materials, and care plan.   The patient has been provided with contact information for the care management team and has been advised to call with any health related questions or concerns.   Please call the care guide team at [phone] if you need to cancel or reschedule your appointment.   Please call the Suicide and Crisis Lifeline: 988 if you are experiencing a Mental Health or Behavioral Health Crisis or need someone to talk to.  Deroy Noah J. Dollene Mallery RN, MSN [facility], [facility] RN Care Manager Direct Dial: [phone]  Fax: [phone] Website: [email or web address]

## 2024-06-27 ENCOUNTER — Inpatient Hospital Stay

## 2024-06-27 ENCOUNTER — Ambulatory Visit: Admitting: Pharmacist

## 2024-06-29 ENCOUNTER — Ambulatory Visit: Admitting: Cardiovascular Disease

## 2024-06-29 ENCOUNTER — Ambulatory Visit: Attending: Cardiology | Admitting: Physician Assistant

## 2024-06-29 VITALS — BP 106/48 | HR 57 | Ht 63.0 in | Wt 186.0 lb

## 2024-06-29 DIAGNOSIS — Z952 Presence of prosthetic heart valve: Secondary | ICD-10-CM

## 2024-06-29 DIAGNOSIS — I480 Paroxysmal atrial fibrillation: Secondary | ICD-10-CM

## 2024-06-29 DIAGNOSIS — I452 Bifascicular block: Secondary | ICD-10-CM

## 2024-06-29 DIAGNOSIS — I739 Peripheral vascular disease, unspecified: Secondary | ICD-10-CM

## 2024-06-29 DIAGNOSIS — I5022 Chronic systolic (congestive) heart failure: Secondary | ICD-10-CM

## 2024-06-29 DIAGNOSIS — I779 Disorder of arteries and arterioles, unspecified: Secondary | ICD-10-CM

## 2024-06-29 DIAGNOSIS — I2510 Atherosclerotic heart disease of native coronary artery without angina pectoris: Secondary | ICD-10-CM

## 2024-06-29 MED ORDER — AMOXICILLIN 500 MG PO TABS: 2000.0000 mg | ORAL_TABLET | ORAL | 12 refills | Status: AC

## 2024-06-29 NOTE — Progress Notes (Addendum)
 " [facility]   MULTIDISCIPLINARY HEART VALVE CLINIC                                     Cardiology Office Note:    Date:  06/30/2024   ID:  Emily [name], DOB 1951-02-12, MRN [id number]  PCP:  Jolinda Norene HERO, DO  [facility] Cardiologist:  Lynwood Schilling, MD  [facility] Structural heart: Lonni Cash, MD [facility] Electrophysiologist:  Will Gladis Norton, MD   Referring MD: Jolinda Norene HERO, DO   TOC s/p TAVR   History of Present Illness:    Emily [name] is a 74 y.o. 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   Echo July 2025 with LVEF 60-65%, severe asymmetrical LVH, normal RV, severe LAE. Moderate to severe aortic stenosis with AVA 0.74 cm2, mean gradient , DI 0.23 SVI 42. Cardiac MRI October 2025 with asymmetrical septal LVH 1.6 cm, LVE 62%. She was referred to structural heart for consultation and had an appointment with Dr. Wonda on 04/18/24, however, she was admitted that day for acute CHF in the setting of atrial fibrillation with RVR. She was diuresed with IV lasix  and treated with IV amiodarone  with eventual conversion to NSR. Echo 04/18/24 with LVEF=45-50%, severe LVH, mild to mod mitral stenosis, mild MR, severe low flow/low gradient aortic stenosis with mean gradient 28 mmHg, AVA 0.6 cm2, DI 0.23, SVI 23. Right and left heart cath 04/21/24 with three vessel CAD. The RCA has three severe calcified lesions in the proximal and mid vessel. The LAD and OM branch have moderately severe lesions. Carotid/extremity dopplers 04/25/24 with 50-75% stenosis in stented RICA and moderate bilateral LE PAD. Turned down for CABG and  She underwent PCI of the RCA with orbital atherectomy and stenting of the proximal, mid and distal RCA on 05/18/24. Medical management reccommended for disease in her LAD and Circumflex/OM. S/p TAVR with a 23 mm Edwards Sapien 3 Ultra Resilia THV via the TF approach on 06/20/24. Post operative echo showed EF 65%,severe MAC with MR and mod MS, normally functioning TAVR with a mean gradient of 10 mmHg and trivial PVL. Given underlying RBBB/LAFB and transient CHB she was discharged home with Zio AT.   Today the patient presents to clinic for follow up. Here alone. No CP or SOB. No LE edema, orthopnea or PND. No dizziness or syncope. No blood in stool or urine. No palpitations.     Past Medical History:  Diagnosis Date   Anemia    Arthritis    Bilateral carotid artery disease    CAD (coronary artery disease)    Complication of anesthesia    difficult intubation in past   Diabetes mellitus without complication (HCC)    type 2   Difficult intubation    states 'lady that did the sleep study told me I have the smallest airway she has ever seen in an adult   Dyslipidemia 06/29/2017   HFrEF (heart failure with reduced ejection fraction) (HCC)    History of blood transfusion 09/2019   GI bleed - in  CE   Hypertension    Hypothyroid    Obesity    Obstructive sleep apnea    occasional uses CPAP   Peripheral vascular disease    Persistent atrial fibrillation (HCC) 01/16/2020   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 Verlin and Dr. Lucas   Severe aortic stenosis    Syncope and collapse 05/19/2024   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 - xrays were negative   Upper GI bleed 09/25/2019     Current Medications: Active Medications[1]    ROS:   Please see the history of present illness.    All other systems reviewed and are negative.  EKGs       Risk  Assessment/Calculations:    CHA2DS2-VASc Score = 4   This indicates a 4.8% annual risk of stroke. The patient's score is based upon: CHF History: 0 HTN History: 1 Diabetes History: 0 Stroke History: 0 Vascular Disease History: 1 Age Score: 1 Gender Score: 1           Physical Exam:    VS:  BP (!) 106/48   Pulse (!) 57   Ht 5' 3 (1.6 m)   Wt 186 lb (84.4 kg)   BMI 32.95 kg/m     Wt Readings from Last 3 Encounters:  06/29/24 186 lb (84.4 kg)  06/20/24 184 lb (83.5 kg)  06/19/24 183 lb 9.6 oz (83.3 kg)     GEN: Well nourished, well developed in no acute distress NECK: No JVD CARDIAC: RRR, 2/6 SEM @ RUSB. No rubs, gallops RESPIRATORY:  Clear to auscultation without rales, wheezing or rhonchi  ABDOMEN: Soft, non-tender, non-distended EXTREMITIES:  No edema; No deformity.  Groin sites clear without hematoma or ecchymosis.   ASSESSMENT:    1. S/P TAVR (transcatheter aortic valve replacement)   2. RBBB (right bundle branch block with left anterior fascicular block)   3. Coronary artery disease involving native coronary artery of native heart without angina pectoris   4. Paroxysmal atrial fibrillation (HCC)   5. Chronic systolic CHF (congestive heart failure), NYHA class 2 (HCC)   6. Peripheral arterial disease   7. Carotid artery disease, unspecified laterality     PLAN:    In order of problems listed above:  Severe AS s/p TAVR:  -- Pt doing excellent s/p TAVR.  -- ECG with no HAVB.  -- Groin sites healing well.  -- SBE prophylaxis discussed; I have RX'd amoxicillin .  -- Continue Aspirin  81mg  daily. -- Cleared to resume all activities without restriction. -- She will see Dr. Lavona back for 1 month follow up in [place]. Echo scheduled for 07/27/24.  Bifascicular block with transient CHB: -- Wearing Zio AT with no HAVB.    CAD:  -- s/p PTCA/DES RCA 05/17/24  -- Continue ASA, Plavix , Zetia  and Pravachol .    PAF:  -- Maintaining sinus.  -- Continue  amiodarone , metoprolol .  -- Watchman in place.     Cardiomyopathy/Chronic systolic CHF:  -- Appears euvolemic.  -- Continue Lasix  40 mg daily.    PAD: -- Pre TAVR CTs showed heavily calcified plaque in the right common femoral artery causing a short segment stenosis of mild to moderate severity. Heavily calcified plaque in the proximal superficial femoral arteries bilaterally. Correlate with ABIs.  -- LE extremity dopplers 04/25/24 showed moderate bilateral LE PAD. -- Continue medical therapy.  --  Follows with [facility].   Carotid artery disease: -- L CEA (2017) and R TCAR (03/2023) with 50-75% stenosis in stented RICA. -- Continue medical therapy.  -- Follows with [facility].     Cardiac Rehabilitation Eligibility Assessment  The patient is ready to start cardiac rehabilitation from a cardiac standpoint.     Medication Adjustments/Labs and Tests Ordered: Current medicines are reviewed at length with the patient today.  Concerns regarding medicines are outlined above.  Orders Placed This Encounter  Procedures   EKG 12-Lead   ECHOCARDIOGRAM COMPLETE   Meds ordered this encounter  Medications   amoxicillin  (AMOXIL ) 500 MG tablet    Sig: Take 4 tablets (2,000 mg total) by mouth as directed. 1 hour prior to dental work including cleanings    Dispense:  12 tablet    Refill:  12    Supervising Provider:   WONDA SHARPER [[id number]]    Patient Instructions  Medication Instructions:  Your physician has recommended you make the following change in your medication:   START Amoxicillin  500 mg, take 4 tablets by mouth 1 hour prior to dental procedures and cleanings.   *If you need a refill on your cardiac medications before your next appointment, please call your pharmacy*  Lab Work: None needed If you have labs (blood work) drawn today and your tests are completely normal, you will receive your results only by: MyChart Message (if you have MyChart) OR A paper copy in the mail If you have any  lab test that is abnormal or we need to change your treatment, we will call you to review the results.  Testing/Procedures: 07/27/2024 Your physician has requested that you have an echocardiogram. Echocardiography is a painless test that uses sound waves to create images of your heart. It provides your doctor with information about the size and shape of your heart and how well your hearts chambers and valves are working. This procedure takes approximately one hour. There are no restrictions for this procedure. Please do NOT wear cologne, perfume, aftershave, or lotions (deodorant is allowed). Please arrive 15 minutes prior to your appointment time.  Please note: We ask at that you not bring children with you during ultrasound (echo/ vascular) testing. Due to room size and safety concerns, children are not allowed in the ultrasound rooms during exams. Our front office staff cannot provide observation of children in our lobby area while testing is being conducted. An adult accompanying a patient to their appointment will only be allowed in the ultrasound room at the discretion of the ultrasound technician under special circumstances. We apologize for any inconvenience.   Follow-Up: At [facility], you and your health needs are our priority.  As part of our continuing mission to provide you with exceptional heart care, our providers are all part of one team.  This team includes your primary Cardiologist (physician) and Advanced Practice Providers or APPs (Physician Assistants and Nurse Practitioners) who all work together to provide you with the care you need, when you need it.  Your next appointment:   As scheduled on 07/12/2024  Provider:   Dr. Soyla Norton  We recommend signing up for the patient portal called MyChart.  Sign up information is provided on this After Visit Summary.  MyChart is used to connect with patients for Virtual Visits (Telemedicine).  Patients are able to view  lab/test results, encounter notes, upcoming appointments, etc.  Non-urgent messages can be sent to your provider as well.   To learn more about what  you can do with MyChart, go to [email or web address].          Signed, Lamarr Hummer, PA-C  06/30/2024 11:26 AM    [facility]     [1]  Current Meds  Medication Sig   amiodarone  (PACERONE ) 200 MG tablet Take 1 tablet (200 mg total) by mouth daily.   amoxicillin  (AMOXIL ) 500 MG tablet Take 4 tablets (2,000 mg total) by mouth as directed. 1 hour prior to dental work including cleanings   aspirin  EC 81 MG tablet Take 1 tablet (81 mg total) by mouth daily. Swallow whole.   clopidogrel  (PLAVIX ) 75 MG tablet Take 1 tablet (75 mg total) by mouth daily.   diphenhydramine -acetaminophen  (TYLENOL  PM) 25-500 MG TABS tablet Take 1 tablet by mouth at bedtime as needed (pain).   ezetimibe  (ZETIA ) 10 MG tablet TAKE 1 TABLET BY MOUTH EVERY DAY   furosemide  (LASIX ) 40 MG tablet Take 1 tablet (40 mg total) by mouth daily.   gabapentin  (NEURONTIN ) 300 MG capsule Take 1 capsule (300 mg total) by mouth 2 (two) times daily.   levonorgestrel (MIRENA) 20 MCG/DAY IUD 1 each by Intrauterine route once.   levothyroxine  (SYNTHROID ) 112 MCG tablet TAKE 1 TABLET BY MOUTH EVERY OTHER DAY, ALTERNATING WITH 1 & 1/2 TABLETS EVERY OTHER DAY   losartan  (COZAAR ) 25 MG tablet Take 1 tablet (25 mg total) by mouth daily.   metFORMIN  (GLUCOPHAGE ) 1000 MG tablet Take 1 tablet (1,000 mg total) by mouth 2 (two) times daily with a meal.   metoprolol  succinate (TOPROL -XL) 25 MG 24 hr tablet Take 1 tablet (25 mg total) by mouth at bedtime.   polyethylene glycol (MIRALAX  / GLYCOLAX ) 17 g packet Take 17 g by mouth daily as needed for moderate constipation.   rOPINIRole  (REQUIP ) 0.5 MG tablet Take 1 tablet (0.5 mg total) by mouth at bedtime.   rosuvastatin  (CRESTOR ) 20 MG tablet Take 1 tablet (20 mg total) by mouth daily.   Semaglutide ,0.25 or 0.5MG /DOS, 2  MG/3ML SOPN Inject 0.5 mg into the skin once a week.   spironolactone  (ALDACTONE ) 25 MG tablet Take 1 tablet (25 mg total) by mouth daily.   Current Facility-Administered Medications for the 06/29/24 encounter (Office Visit) with Hummer Lamarr SAUNDERS, PA-C  Medication   cyanocobalamin  ((VITAMIN B-12)) injection 1,000 mcg   "

## 2024-06-29 NOTE — Patient Instructions (Signed)
 Medication Instructions:  Your physician has recommended you make the following change in your medication:   START Amoxicillin  500 mg, take 4 tablets by mouth 1 hour prior to dental procedures and cleanings.   *If you need a refill on your cardiac medications before your next appointment, please call your pharmacy*  Lab Work: None needed If you have labs (blood work) drawn today and your tests are completely normal, you will receive your results only by: MyChart Message (if you have MyChart) OR A paper copy in the mail If you have any lab test that is abnormal or we need to change your treatment, we will call you to review the results.  Testing/Procedures: 07/27/2024 Your physician has requested that you have an echocardiogram. Echocardiography is a painless test that uses sound waves to create images of your heart. It provides your doctor with information about the size and shape of your heart and how well your hearts chambers and valves are working. This procedure takes approximately one hour. There are no restrictions for this procedure. Please do NOT wear cologne, perfume, aftershave, or lotions (deodorant is allowed). Please arrive 15 minutes prior to your appointment time.  Please note: We ask at that you not bring children with you during ultrasound (echo/ vascular) testing. Due to room size and safety concerns, children are not allowed in the ultrasound rooms during exams. Our front office staff cannot provide observation of children in our lobby area while testing is being conducted. An adult accompanying a patient to their appointment will only be allowed in the ultrasound room at the discretion of the ultrasound technician under special circumstances. We apologize for any inconvenience.   Follow-Up: At [facility], you and your health needs are our priority.  As part of our continuing mission to provide you with exceptional heart care, our providers are all part of one team.   This team includes your primary Cardiologist (physician) and Advanced Practice Providers or APPs (Physician Assistants and Nurse Practitioners) who all work together to provide you with the care you need, when you need it.  Your next appointment:   As scheduled on 07/12/2024  Provider:   Dr. Soyla Norton  We recommend signing up for the patient portal called MyChart.  Sign up information is provided on this After Visit Summary.  MyChart is used to connect with patients for Virtual Visits (Telemedicine).  Patients are able to view lab/test results, encounter notes, upcoming appointments, etc.  Non-urgent messages can be sent to your provider as well.   To learn more about what you can do with MyChart, go to [email or web address].

## 2024-06-30 ENCOUNTER — Ambulatory Visit (INDEPENDENT_AMBULATORY_CARE_PROVIDER_SITE_OTHER): Admitting: *Deleted

## 2024-06-30 ENCOUNTER — Encounter: Payer: Self-pay | Admitting: *Deleted

## 2024-06-30 DIAGNOSIS — E538 Deficiency of other specified B group vitamins: Secondary | ICD-10-CM | POA: Diagnosis not present

## 2024-06-30 NOTE — Progress Notes (Signed)
 Patient is in office today for a nurse visit for B12 Injection. Patient Injection was given in the  Left deltoid. Patient tolerated injection well.

## 2024-07-03 ENCOUNTER — Ambulatory Visit

## 2024-07-04 ENCOUNTER — Ambulatory Visit: Admitting: Cardiology

## 2024-07-07 ENCOUNTER — Other Ambulatory Visit: Payer: Self-pay | Admitting: Student

## 2024-07-07 MED ORDER — ROSUVASTATIN CALCIUM 20 MG PO TABS: 20.0000 mg | ORAL_TABLET | Freq: Every day | ORAL | 0 refills | Status: AC

## 2024-07-07 NOTE — Telephone Encounter (Signed)
 In accordance with refill protocols, please review and address the following requirements before this medication refill can be authorized:  Labs   Lipid Panel within 12 months 03/14/24  30 day sent

## 2024-07-10 ENCOUNTER — Ambulatory Visit

## 2024-07-10 ENCOUNTER — Ambulatory Visit (HOSPITAL_COMMUNITY)

## 2024-07-12 ENCOUNTER — Ambulatory Visit: Admitting: Cardiology

## 2024-07-27 ENCOUNTER — Other Ambulatory Visit (HOSPITAL_COMMUNITY)

## 2024-08-01 ENCOUNTER — Ambulatory Visit

## 2024-08-08 ENCOUNTER — Other Ambulatory Visit (HOSPITAL_COMMUNITY)

## 2024-08-16 ENCOUNTER — Ambulatory Visit: Admitting: Cardiology

## 2024-08-29 ENCOUNTER — Ambulatory Visit: Admitting: Family Medicine

## 2024-08-29 ENCOUNTER — Other Ambulatory Visit

## 2024-10-17 ENCOUNTER — Encounter: Admitting: Family Medicine
